# Patient Record
Sex: Female | Born: 1935
Health system: Southern US, Community
[De-identification: ages and names within clinical notes are randomized; demographics above are authoritative.]

## PROBLEM LIST (undated history)

## (undated) DIAGNOSIS — G9340 Encephalopathy, unspecified: Secondary | ICD-10-CM

## (undated) DIAGNOSIS — E7800 Pure hypercholesterolemia, unspecified: Secondary | ICD-10-CM

## (undated) DIAGNOSIS — Z7901 Long term (current) use of anticoagulants: Secondary | ICD-10-CM

## (undated) DIAGNOSIS — K649 Unspecified hemorrhoids: Secondary | ICD-10-CM

## (undated) DIAGNOSIS — N3000 Acute cystitis without hematuria: Secondary | ICD-10-CM

## (undated) DIAGNOSIS — I4891 Unspecified atrial fibrillation: Secondary | ICD-10-CM

## (undated) DIAGNOSIS — I10 Essential (primary) hypertension: Secondary | ICD-10-CM

## (undated) DIAGNOSIS — G3184 Mild cognitive impairment, so stated: Secondary | ICD-10-CM

## (undated) DIAGNOSIS — K5900 Constipation, unspecified: Secondary | ICD-10-CM

## (undated) DIAGNOSIS — M1990 Unspecified osteoarthritis, unspecified site: Secondary | ICD-10-CM

## (undated) DIAGNOSIS — E785 Hyperlipidemia, unspecified: Secondary | ICD-10-CM

## (undated) DIAGNOSIS — K635 Polyp of colon: Secondary | ICD-10-CM

## (undated) DIAGNOSIS — E46 Unspecified protein-calorie malnutrition: Secondary | ICD-10-CM

## (undated) DIAGNOSIS — M270 Developmental disorders of jaws: Secondary | ICD-10-CM

## (undated) DIAGNOSIS — R29898 Other symptoms and signs involving the musculoskeletal system: Secondary | ICD-10-CM

## (undated) DIAGNOSIS — H9190 Unspecified hearing loss, unspecified ear: Secondary | ICD-10-CM

## (undated) DIAGNOSIS — I639 Cerebral infarction, unspecified: Secondary | ICD-10-CM

## (undated) DIAGNOSIS — M810 Age-related osteoporosis without current pathological fracture: Secondary | ICD-10-CM

## (undated) DIAGNOSIS — R531 Weakness: Secondary | ICD-10-CM

## (undated) DIAGNOSIS — G459 Transient cerebral ischemic attack, unspecified: Secondary | ICD-10-CM

## (undated) DIAGNOSIS — R569 Unspecified convulsions: Secondary | ICD-10-CM

## (undated) DIAGNOSIS — I4892 Unspecified atrial flutter: Secondary | ICD-10-CM

## (undated) HISTORY — DX: Polyp of colon: K63.5

## (undated) HISTORY — DX: Unspecified protein-calorie malnutrition: E46

## (undated) HISTORY — DX: Unspecified convulsions: R56.9

## (undated) HISTORY — DX: Mild cognitive impairment of uncertain or unknown etiology: G31.84

## (undated) HISTORY — DX: Constipation, unspecified: K59.00

## (undated) HISTORY — DX: Acute cystitis without hematuria: N30.00

## (undated) HISTORY — PX: MOUTH SURGERY: SHX715

## (undated) HISTORY — PX: GANGLION CYST EXCISION: SHX1691

## (undated) HISTORY — DX: Essential (primary) hypertension: I10

## (undated) HISTORY — DX: Weakness: R53.1

## (undated) HISTORY — PX: CATARACT EXTRACTION W/ INTRAOCULAR LENS  IMPLANT, BILATERAL: SHX1307

## (undated) HISTORY — DX: Transient cerebral ischemic attack, unspecified: G45.9

## (undated) HISTORY — DX: Unspecified hemorrhoids: K64.9

## (undated) HISTORY — DX: Cerebral infarction, unspecified: I63.9

## (undated) HISTORY — DX: Age-related osteoporosis without current pathological fracture: M81.0

## (undated) HISTORY — DX: Hyperlipidemia, unspecified: E78.5

## (undated) HISTORY — PX: TONSILLECTOMY: SUR1361

## (undated) HISTORY — DX: Unspecified osteoarthritis, unspecified site: M19.90

## (undated) HISTORY — DX: Unspecified atrial flutter: I48.92

## (undated) HISTORY — DX: Developmental disorders of jaws: M27.0

## (undated) HISTORY — DX: Unspecified hearing loss, unspecified ear: H91.90

## (undated) HISTORY — DX: Encephalopathy, unspecified: G93.40

---
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
## 1999-05-07 ENCOUNTER — Other Ambulatory Visit: Admission: RE | Admit: 1999-05-07 | Discharge: 1999-05-07 | Payer: Self-pay | Admitting: *Deleted

## 2002-02-15 LAB — CONVERTED CEMR LAB: Pap Smear: NORMAL

## 2008-02-05 ENCOUNTER — Ambulatory Visit: Payer: Self-pay | Admitting: *Deleted

## 2008-02-05 DIAGNOSIS — F1520 Other stimulant dependence, uncomplicated: Secondary | ICD-10-CM | POA: Insufficient documentation

## 2008-02-05 DIAGNOSIS — G43909 Migraine, unspecified, not intractable, without status migrainosus: Secondary | ICD-10-CM

## 2008-02-05 DIAGNOSIS — Z860100 Personal history of colon polyps, unspecified: Secondary | ICD-10-CM | POA: Insufficient documentation

## 2008-02-05 DIAGNOSIS — H9190 Unspecified hearing loss, unspecified ear: Secondary | ICD-10-CM | POA: Insufficient documentation

## 2008-02-05 DIAGNOSIS — D485 Neoplasm of uncertain behavior of skin: Secondary | ICD-10-CM

## 2008-02-05 DIAGNOSIS — M159 Polyosteoarthritis, unspecified: Secondary | ICD-10-CM

## 2008-02-05 DIAGNOSIS — I10 Essential (primary) hypertension: Secondary | ICD-10-CM | POA: Insufficient documentation

## 2008-02-05 DIAGNOSIS — H269 Unspecified cataract: Secondary | ICD-10-CM

## 2008-02-08 ENCOUNTER — Ambulatory Visit: Payer: Self-pay | Admitting: *Deleted

## 2008-02-08 ENCOUNTER — Ambulatory Visit (HOSPITAL_BASED_OUTPATIENT_CLINIC_OR_DEPARTMENT_OTHER): Admission: RE | Admit: 2008-02-08 | Discharge: 2008-02-08 | Payer: Self-pay | Admitting: *Deleted

## 2008-02-08 LAB — CONVERTED CEMR LAB
ALT: 12 units/L (ref 0–35)
AST: 20 units/L (ref 0–37)
Albumin: 3.5 g/dL (ref 3.5–5.2)
Alkaline Phosphatase: 68 units/L (ref 39–117)
BUN: 17 mg/dL (ref 6–23)
Basophils Absolute: 0 10*3/uL (ref 0.0–0.1)
Basophils Relative: 0.9 % (ref 0.0–3.0)
CO2: 32 meq/L (ref 19–32)
Calcium: 9 mg/dL (ref 8.4–10.5)
Chloride: 106 meq/L (ref 96–112)
Cholesterol: 184 mg/dL (ref 0–200)
Creatinine, Ser: 0.9 mg/dL (ref 0.4–1.2)
Eosinophils Absolute: 0.2 10*3/uL (ref 0.0–0.7)
Eosinophils Relative: 4.1 % (ref 0.0–5.0)
GFR calc Af Amer: 77 mL/min
GFR calc non Af Amer: 64 mL/min
Glucose, Bld: 100 mg/dL — ABNORMAL HIGH (ref 70–99)
HCT: 42.2 % (ref 36.0–46.0)
HDL: 52 mg/dL (ref >39.0)
Hemoglobin: 13.8 g/dL (ref 12.0–15.0)
LDL Cholesterol: 113 mg/dL — ABNORMAL HIGH (ref 0–99)
Lymphocytes Relative: 20.1 % (ref 12.0–46.0)
MCHC: 32.8 g/dL (ref 30.0–36.0)
MCV: 93.5 fL (ref 78.0–100.0)
Monocytes Absolute: 0.6 10*3/uL (ref 0.1–1.0)
Monocytes Relative: 12.4 % — ABNORMAL HIGH (ref 3.0–12.0)
Neutro Abs: 3 10*3/uL (ref 1.4–7.7)
Neutrophils Relative %: 62.5 % (ref 43.0–77.0)
Platelets: 161 10*3/uL (ref 150–400)
Potassium: 4.5 meq/L (ref 3.5–5.1)
RBC: 4.51 M/uL (ref 3.87–5.11)
RDW: 12.8 % (ref 11.5–14.6)
Sodium: 141 meq/L (ref 135–145)
TSH: 0.8 microintl units/mL (ref 0.35–5.50)
Total Bilirubin: 0.7 mg/dL (ref 0.3–1.2)
Total CHOL/HDL Ratio: 3.5
Total Protein: 6.6 g/dL (ref 6.0–8.3)
Triglycerides: 97 mg/dL (ref 0–149)
VLDL: 19 mg/dL (ref 0–40)
WBC: 4.7 10*3/uL (ref 4.5–10.5)

## 2008-05-28 ENCOUNTER — Telehealth (INDEPENDENT_AMBULATORY_CARE_PROVIDER_SITE_OTHER): Payer: Self-pay | Admitting: *Deleted

## 2009-05-06 ENCOUNTER — Ambulatory Visit: Payer: Self-pay | Admitting: Internal Medicine

## 2009-05-13 ENCOUNTER — Ambulatory Visit: Payer: Self-pay | Admitting: Family Medicine

## 2009-05-13 ENCOUNTER — Encounter: Payer: Self-pay | Admitting: Internal Medicine

## 2009-06-03 ENCOUNTER — Ambulatory Visit: Payer: Self-pay | Admitting: Internal Medicine

## 2009-06-03 DIAGNOSIS — M8580 Other specified disorders of bone density and structure, unspecified site: Secondary | ICD-10-CM

## 2009-06-09 ENCOUNTER — Telehealth: Payer: Self-pay | Admitting: Internal Medicine

## 2009-07-30 ENCOUNTER — Ambulatory Visit: Payer: Self-pay | Admitting: Internal Medicine

## 2009-07-30 LAB — CONVERTED CEMR LAB
BUN: 22 mg/dL (ref 6–23)
CO2: 26 meq/L (ref 19–32)
Calcium: 9.6 mg/dL (ref 8.4–10.5)
Chloride: 104 meq/L (ref 96–112)
Creatinine, Ser: 0.99 mg/dL (ref 0.40–1.20)
Glucose, Bld: 99 mg/dL (ref 70–99)
Potassium: 4.5 meq/L (ref 3.5–5.3)
Sodium: 142 meq/L (ref 135–145)
TSH: 0.681 microintl units/mL (ref 0.350–4.500)
Vit D, 1,25-Dihydroxy: 38 (ref 30–89)

## 2009-08-04 ENCOUNTER — Ambulatory Visit: Payer: Self-pay | Admitting: Internal Medicine

## 2009-10-07 ENCOUNTER — Ambulatory Visit: Payer: Self-pay | Admitting: Diagnostic Radiology

## 2009-10-07 ENCOUNTER — Ambulatory Visit: Payer: Self-pay | Admitting: Internal Medicine

## 2009-10-07 ENCOUNTER — Ambulatory Visit (HOSPITAL_BASED_OUTPATIENT_CLINIC_OR_DEPARTMENT_OTHER): Admission: RE | Admit: 2009-10-07 | Discharge: 2009-10-07 | Payer: Self-pay | Admitting: Internal Medicine

## 2009-10-07 LAB — CONVERTED CEMR LAB
Calcium: 9.4 mg/dL (ref 8.4–10.5)
Creatinine, Ser: 0.84 mg/dL (ref 0.40–1.20)

## 2009-10-10 ENCOUNTER — Telehealth: Payer: Self-pay | Admitting: Internal Medicine

## 2009-10-16 ENCOUNTER — Encounter (HOSPITAL_COMMUNITY): Admission: RE | Admit: 2009-10-16 | Discharge: 2009-10-16 | Payer: Self-pay | Admitting: Internal Medicine

## 2009-10-19 ENCOUNTER — Encounter: Payer: Self-pay | Admitting: Internal Medicine

## 2009-10-30 ENCOUNTER — Encounter: Payer: Self-pay | Admitting: Internal Medicine

## 2009-12-02 ENCOUNTER — Ambulatory Visit: Payer: Self-pay | Admitting: Internal Medicine

## 2009-12-02 DIAGNOSIS — K120 Recurrent oral aphthae: Secondary | ICD-10-CM

## 2009-12-09 ENCOUNTER — Ambulatory Visit: Payer: Self-pay | Admitting: Internal Medicine

## 2009-12-11 ENCOUNTER — Encounter: Payer: Self-pay | Admitting: Internal Medicine

## 2009-12-25 ENCOUNTER — Ambulatory Visit: Payer: Self-pay | Admitting: Internal Medicine

## 2009-12-31 ENCOUNTER — Encounter: Payer: Self-pay | Admitting: Internal Medicine

## 2009-12-31 LAB — CONVERTED CEMR LAB
BUN: 17 mg/dL (ref 6–23)
Basophils Absolute: 0.1 10*3/uL (ref 0.0–0.1)
Basophils Relative: 2 % — ABNORMAL HIGH (ref 0–1)
CO2: 27 meq/L (ref 19–32)
Calcium: 8.5 mg/dL (ref 8.4–10.5)
Chloride: 107 meq/L (ref 96–112)
Cholesterol: 173 mg/dL (ref 0–200)
Creatinine, Ser: 0.86 mg/dL (ref 0.40–1.20)
Eosinophils Absolute: 0.1 10*3/uL (ref 0.0–0.7)
Eosinophils Relative: 2 % (ref 0–5)
Glucose, Bld: 99 mg/dL (ref 70–99)
HCT: 41.8 % (ref 36.0–46.0)
HDL: 57 mg/dL (ref >39)
Hemoglobin: 13.7 g/dL (ref 12.0–15.0)
LDL Cholesterol: 101 mg/dL — ABNORMAL HIGH (ref 0–99)
Lymphocytes Relative: 24 % (ref 12–46)
Lymphs Abs: 1.1 10*3/uL (ref 0.7–4.0)
MCHC: 32.8 g/dL (ref 30.0–36.0)
MCV: 90.9 fL (ref 78.0–100.0)
Monocytes Absolute: 0.7 10*3/uL (ref 0.1–1.0)
Monocytes Relative: 14 % — ABNORMAL HIGH (ref 3–12)
Neutro Abs: 2.8 10*3/uL (ref 1.7–7.7)
Neutrophils Relative %: 59 % (ref 43–77)
Platelets: 226 10*3/uL (ref 150–400)
Potassium: 4.2 meq/L (ref 3.5–5.3)
RBC: 4.6 M/uL (ref 3.87–5.11)
RDW: 14 % (ref 11.5–15.5)
Sodium: 143 meq/L (ref 135–145)
TSH: 0.797 microintl units/mL (ref 0.350–4.500)
Total CHOL/HDL Ratio: 3
Triglycerides: 75 mg/dL (ref <150)
VLDL: 15 mg/dL (ref 0–40)
WBC: 4.8 10*3/uL (ref 4.0–10.5)

## 2010-01-01 ENCOUNTER — Encounter: Payer: Self-pay | Admitting: Internal Medicine

## 2010-01-09 ENCOUNTER — Ambulatory Visit (HOSPITAL_BASED_OUTPATIENT_CLINIC_OR_DEPARTMENT_OTHER): Admission: RE | Admit: 2010-01-09 | Discharge: 2010-01-09 | Payer: Self-pay | Admitting: Internal Medicine

## 2010-01-09 ENCOUNTER — Ambulatory Visit: Payer: Self-pay | Admitting: Internal Medicine

## 2010-01-09 ENCOUNTER — Ambulatory Visit: Payer: Self-pay | Admitting: Diagnostic Radiology

## 2010-01-09 DIAGNOSIS — R404 Transient alteration of awareness: Secondary | ICD-10-CM

## 2010-01-09 LAB — HM MAMMOGRAPHY: HM Mammogram: NORMAL

## 2010-03-20 ENCOUNTER — Telehealth: Payer: Self-pay | Admitting: Internal Medicine

## 2010-03-29 DIAGNOSIS — I4892 Unspecified atrial flutter: Secondary | ICD-10-CM

## 2010-03-29 HISTORY — DX: Unspecified atrial flutter: I48.92

## 2010-04-14 ENCOUNTER — Inpatient Hospital Stay (HOSPITAL_COMMUNITY)
Admission: AD | Admit: 2010-04-14 | Discharge: 2010-04-15 | Payer: Self-pay | Source: Home / Self Care | Attending: Internal Medicine | Admitting: Internal Medicine

## 2010-04-14 ENCOUNTER — Emergency Department (HOSPITAL_BASED_OUTPATIENT_CLINIC_OR_DEPARTMENT_OTHER)
Admission: EM | Admit: 2010-04-14 | Discharge: 2010-04-14 | Disposition: A | Discharge status: Other Acute Inpatient Hospital | Payer: Self-pay | Source: Home / Self Care | Admitting: Emergency Medicine

## 2010-04-14 ENCOUNTER — Encounter: Payer: Self-pay | Admitting: Internal Medicine

## 2010-04-15 ENCOUNTER — Encounter (INDEPENDENT_AMBULATORY_CARE_PROVIDER_SITE_OTHER): Payer: Self-pay | Admitting: Family Medicine

## 2010-04-15 LAB — URINALYSIS, ROUTINE W REFLEX MICROSCOPIC
Bilirubin Urine: NEGATIVE
Ketones, ur: NEGATIVE mg/dL
Nitrite: NEGATIVE
Protein, ur: NEGATIVE mg/dL
Specific Gravity, Urine: 1.016 (ref 1.005–1.030)
Urine Glucose, Fasting: NEGATIVE mg/dL
Urobilinogen, UA: 0.2 mg/dL (ref 0.0–1.0)
pH: 5.5 (ref 5.0–8.0)

## 2010-04-15 LAB — POCT CARDIAC MARKERS
CKMB, poc: <1 ng/mL — ABNORMAL LOW (ref 1.0–8.0)
Myoglobin, poc: 65.8 ng/mL (ref 12–200)
Troponin i, poc: <0.05 ng/mL (ref 0.00–0.09)

## 2010-04-15 LAB — BASIC METABOLIC PANEL
BUN: 18 mg/dL (ref 6–23)
CO2: 25 mEq/L (ref 19–32)
Calcium: 8.9 mg/dL (ref 8.4–10.5)
Chloride: 110 mEq/L (ref 96–112)
Creatinine, Ser: 0.9 mg/dL (ref 0.4–1.2)
GFR calc Af Amer: >60 mL/min (ref >60)
GFR calc non Af Amer: 60 mL/min — ABNORMAL LOW (ref >60)
Glucose, Bld: 114 mg/dL — ABNORMAL HIGH (ref 70–99)
Potassium: 4.2 mEq/L (ref 3.5–5.1)
Sodium: 145 mEq/L (ref 135–145)

## 2010-04-15 LAB — CARDIAC PANEL(CRET KIN+CKTOT+MB+TROPI)
CK, MB: 1.2 ng/mL (ref 0.3–4.0)
Relative Index: INVALID (ref 0.0–2.5)
Total CK: 28 U/L (ref 7–177)
Troponin I: 0.02 ng/mL (ref 0.00–0.06)

## 2010-04-15 LAB — DIFFERENTIAL
Basophils Absolute: 0.1 10*3/uL (ref 0.0–0.1)
Basophils Relative: 1 % (ref 0–1)
Eosinophils Absolute: 0.2 10*3/uL (ref 0.0–0.7)
Eosinophils Relative: 3 % (ref 0–5)
Lymphocytes Relative: 30 % (ref 12–46)
Lymphs Abs: 1.6 10*3/uL (ref 0.7–4.0)
Monocytes Absolute: 0.6 10*3/uL (ref 0.1–1.0)
Monocytes Relative: 11 % (ref 3–12)
Neutro Abs: 3 10*3/uL (ref 1.7–7.7)
Neutrophils Relative %: 55 % (ref 43–77)

## 2010-04-15 LAB — CBC
HCT: 40.3 % (ref 36.0–46.0)
Hemoglobin: 13.5 g/dL (ref 12.0–15.0)
MCH: 29.3 pg (ref 26.0–34.0)
MCHC: 33.5 g/dL (ref 30.0–36.0)
MCV: 87.6 fL (ref 78.0–100.0)
Platelets: 186 10*3/uL (ref 150–400)
RBC: 4.6 MIL/uL (ref 3.87–5.11)
RDW: 13.8 % (ref 11.5–15.5)
WBC: 5.4 10*3/uL (ref 4.0–10.5)

## 2010-04-15 LAB — URINE MICROSCOPIC-ADD ON

## 2010-04-15 LAB — BRAIN NATRIURETIC PEPTIDE: Pro B Natriuretic peptide (BNP): 173 pg/mL — ABNORMAL HIGH (ref 0.0–100.0)

## 2010-04-15 LAB — PROTIME-INR
INR: 0.94 (ref 0.00–1.49)
Prothrombin Time: 12.8 seconds (ref 11.6–15.2)

## 2010-04-15 LAB — D-DIMER, QUANTITATIVE: D-Dimer, Quant: 0.69 ug/mL-FEU — ABNORMAL HIGH (ref 0.00–0.48)

## 2010-04-20 LAB — COMPREHENSIVE METABOLIC PANEL
ALT: 12 U/L (ref 0–35)
AST: 22 U/L (ref 0–37)
Albumin: 3.1 g/dL — ABNORMAL LOW (ref 3.5–5.2)
Alkaline Phosphatase: 48 U/L (ref 39–117)
BUN: 13 mg/dL (ref 6–23)
CO2: 25 mEq/L (ref 19–32)
Calcium: 8.7 mg/dL (ref 8.4–10.5)
Chloride: 111 mEq/L (ref 96–112)
Creatinine, Ser: 0.9 mg/dL (ref 0.4–1.2)
GFR calc Af Amer: >60 mL/min (ref >60)
GFR calc non Af Amer: 60 mL/min — ABNORMAL LOW (ref >60)
Glucose, Bld: 109 mg/dL — ABNORMAL HIGH (ref 70–99)
Potassium: 4 mEq/L (ref 3.5–5.1)
Sodium: 143 mEq/L (ref 135–145)
Total Bilirubin: 0.6 mg/dL (ref 0.3–1.2)
Total Protein: 5.5 g/dL — ABNORMAL LOW (ref 6.0–8.3)

## 2010-04-20 LAB — PHOSPHORUS: Phosphorus: 3.9 mg/dL (ref 2.3–4.6)

## 2010-04-20 LAB — CBC
HCT: 39.5 % (ref 36.0–46.0)
Hemoglobin: 12.9 g/dL (ref 12.0–15.0)
MCH: 29.9 pg (ref 26.0–34.0)
MCHC: 32.7 g/dL (ref 30.0–36.0)
MCV: 91.4 fL (ref 78.0–100.0)
Platelets: 167 10*3/uL (ref 150–400)
RBC: 4.32 MIL/uL (ref 3.87–5.11)
RDW: 13.9 % (ref 11.5–15.5)
WBC: 5.6 10*3/uL (ref 4.0–10.5)

## 2010-04-20 LAB — CARDIAC PANEL(CRET KIN+CKTOT+MB+TROPI)
CK, MB: 1.1 ng/mL (ref 0.3–4.0)
CK, MB: 1.3 ng/mL (ref 0.3–4.0)
Relative Index: INVALID (ref 0.0–2.5)
Relative Index: INVALID (ref 0.0–2.5)
Total CK: 28 U/L (ref 7–177)
Total CK: 26 U/L (ref 7–177)
Troponin I: 0.02 ng/mL (ref 0.00–0.06)
Troponin I: 0.02 ng/mL (ref 0.00–0.06)

## 2010-04-20 LAB — TSH: TSH: 0.387 u[IU]/mL (ref 0.350–4.500)

## 2010-04-20 LAB — MAGNESIUM: Magnesium: 2.1 mg/dL (ref 1.5–2.5)

## 2010-04-26 ENCOUNTER — Encounter: Payer: Self-pay | Admitting: Internal Medicine

## 2010-04-26 LAB — CONVERTED CEMR LAB

## 2010-04-28 NOTE — Assessment & Plan Note (Signed)
Summary: mouth ulcers/hea   Vital Signs:  Patient profile:   77 year old female Height:      67 inches Weight:      125.50 pounds BMI:     19.73 O2 Sat:      96 % on Room air Temp:     98.0 degrees F oral Pulse rate:   71 / minute Pulse rhythm:   regular Resp:     16 per minute BP sitting:   142 / 72  (right arm) Cuff size:   regular  Vitals Entered By: Glendell Docker CMA (December 09, 2009 2:05 PM)  O2 Flow:  Room air CC: mouth ulcer unresolved Is Patient Diabetic? No Pain Assessment Patient in pain? no      Comments completed  medication today, and still unresolved-some improvement, but not completly healed   Primary Care Provider:  Dondra Spry DO  CC:  mouth ulcer unresolved.  History of Present Illness: 77 y/o white female for fu she finished anti viral medication no sign change in oral ulcer located roof of mouth irritated  by acidic foods  Preventive Screening-Counseling & Management  Alcohol-Tobacco     Smoking Status: quit  Allergies: 1)  Alendronate Sodium (Alendronate Sodium)  Past History:  Past Surgical History: Appendectomy (02/19/1953)  Tonsillectomy (02/15/1934) cataract removal 2006 & 2008    ganglion cyst removal 1938, 1954, 2003, 2005     Physical Exam  General:  alert, well-developed, and well-nourished.   Mouth:  3 mm shallow ulcer roof of mouth  Lungs:  normal respiratory effort and normal breath sounds.   Heart:  normal rate, regular rhythm, and no gallop.     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  refer to ENT  Orders: ENT Referral (ENT)  Complete Medication List: 1)  Excedrin Migraine 250-250-65 Mg Tabs (Aspirin-acetaminophen-caffeine) .... Otc as needed 2)  Zostavax 16109 Unt/0.83ml Solr (Zoster vaccine live) .... Administer vaccine x 1 3)  Hydrochlorothiazide 12.5 Mg Caps (Hydrochlorothiazide) ....  One by mouth once daily 4)  Caltrate 600+d Plus 600-400 Mg-unit Chew (Calcium carbonate-vit d-min) .... One by mouth bid 5)  Lidocaine Viscous 2 % Soln (Lidocaine hcl) .... Use 5-10 ml three times a day as needed (swish and spit)  Other Orders: Influenza Vaccine MCR (60454) Flu Vaccine 71yrs + MEDICARE PATIENTS (U9811)  Patient Instructions: 1)  Our office will contact you re:  referral to ENT 2)  Take vitamin C supplement over the counter once daily Prescriptions: LIDOCAINE VISCOUS 2 % SOLN (LIDOCAINE HCL) use 5-10 ml three times a day as needed (swish and spit)  #120 ml x 1   Entered and Authorized by:   D. Thomos Lemons DO   Signed by:   D. Thomos Lemons DO on 12/09/2009   Method used:   [facility]        [facility]  [address] [phone][facility] (retail)       [address]       [place], [place]  [address]       Ph: [phone] or [phone]       Fax: [phone]   RxID:   [id number]    Immunizations Administered:  Influenza Vaccine # 1:    Vaccine Type: Fluvax MCR    Site: left deltoid    Mfr: GlaxoSmithKline    Dose: 0.5 ml  Route: IM    Given by: Glendell Docker CMA    Exp. Date: 09/26/2010    Lot #: [id number]    VIS given: 10/21/09 version given December 09, 2009.  Flu Vaccine Consent Questions:    Do you have a history of severe allergic reactions to this vaccine? no    Any prior history of allergic reactions to egg and/or gelatin? no    Do you have a sensitivity to the preservative Thimersol? no    Do you have a past history of Guillan-Barre Syndrome? no    Do you currently have an acute febrile illness? no    Have you ever had a severe reaction to latex? no    Vaccine information given and explained to patient? yes    Are you currently pregnant? no

## 2010-04-28 NOTE — Miscellaneous (Signed)
Summary: Lab Orders for October appt.  Clinical Lists Changes  Orders: Added new Test order of T-Basic Metabolic Panel [phone]) - Signed Added new Test order of T-Lipid Profile [phone]) - Signed Added new Test order of T-CBC w/Diff [phone]) - Signed Added new Test order of T-TSH ([id number]) - Signed

## 2010-04-28 NOTE — Progress Notes (Signed)
Summary: Test Results  Phone Note Outgoing Call   Summary of Call: call pt - abd ultrasound negative for aneurysm Initial call taken by: D. Thomos Lemons DO,  October 10, 2009 1:00 PM  Follow-up for Phone Call        attempted to contact patient at [phone], no answer.  a detailed voice message was left informing patient per Dr Artist Pais instructions. Patient was advised to call with any questions Follow-up by: Glendell Docker CMA,  October 10, 2009 4:54 PM

## 2010-04-28 NOTE — Miscellaneous (Signed)
Summary: BONE DENSITY  Clinical Lists Changes  Orders: Added new Test order of T-Bone Densitometry (77080) - Signed Added new Test order of T-Lumbar Vertebral Assessment (77082) - Signed 

## 2010-04-28 NOTE — Assessment & Plan Note (Signed)
Summary: 1 MONTH FOLLOW UP/MHF   Vital Signs:  Patient profile:   77 year old female Weight:      126.75 pounds BMI:     19.92 O2 Sat:      98 % Temp:     97.7 degrees F oral Pulse rate:   69 / minute Pulse rhythm:   regular Resp:     18 per minute BP sitting:   156 / 80  (right arm) Cuff size:   regular  Vitals Entered By: Glendell Docker CMA (June 03, 2009 9:04 AM) CC: Rm 2- 1 Month Follow up Comments follow up blood pressure reading, c/o right eye drainage and pain radiating to jaw   Primary Care Provider:  DThomos Lemons DO  CC:  Rm 2- 1 Month Follow up.  History of Present Illness: 77 y/o white female for f/u re:  elevated BP w/o diagnosis of htn reviewed home bp readings reviewed DEXA scan results  pt accompanied by daughter who works as Engineer, civil (consulting)   Allergies (verified): No Known Drug Allergies  Past History:  Past Medical History: arthritis chicken pox White Coat HTN - home readings WNL  migraines  Blood transfusion Colonic polyps, hx of hearing deficit  Past Surgical History: Appendectomy (02/19/1953) Tonsillectomy (02/15/1934) cataract removal 2006 & 2008  ganglion cyst removal 1938, 1954, 2003, 2005     Family History: Family History of Colon CA  Family History Breast cancer Family History of brain cancer Mother died of blood clot to brain, she had breast cancer Father died of "old age"    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 Alcohol use-yes (glass on wine per week) former smoker     Physical Exam  General:  alert, well-developed, and well-nourished.   Lungs:  normal respiratory effort and normal breath sounds.   Heart:  normal rate, regular rhythm, no murmur, and no gallop.   Msk:  thin,  loss in generalized musche mass   Impression & Recommendations:  Problem # 1:  OSTEOPENIA (ICD-733.90)  DEXA shows right hip score  -2.3.  Start fosamax.  take calcium and vit D  supplement.  encouraged walking program Her updated medication list for this problem includes:    Alendronate Sodium 70 Mg Tabs (Alendronate sodium) ..... One by mouth qwkly  Orders: Prescription Created Electronically [phone])  Problem # 2:  HYPERTENSION (ICD-401.9)  start low dose diuretic.   Home BP readings reviewed.  Most SBP readings 130's with occ 140's and 150's.  Her updated medication list for this problem includes:    Hydrochlorothiazide 12.5 Mg Tabs (Hydrochlorothiazide) .Marland Kitchen... 1/2 by mouth qd  BP today: 156/80 Prior BP: 152/78 (05/06/2009)  Labs Reviewed: K+: 4.5 (02/08/2008) Creat: : 0.9 (02/08/2008)   Chol: 184 (02/08/2008)   HDL: 52.0 (02/08/2008)   LDL: 113 (02/08/2008)   TG: 97 (02/08/2008)  Orders: Prescription Created Electronically [phone])  Complete Medication List: 1)  Excedrin Migraine 250-250-65 Mg Tabs (Aspirin-acetaminophen-caffeine) .... Otc as needed 2)  Zostavax 21308 Unt/0.9ml Solr (Zoster vaccine live) .... Administer vaccine x 1 3)  Hydrochlorothiazide 12.5 Mg Tabs (Hydrochlorothiazide) .... 1/2 by mouth qd 4)  Alendronate Sodium 70 Mg Tabs (Alendronate sodium) .... One by mouth qwkly 5)  Caltrate 600+d Plus 600-400 Mg-unit Chew (Calcium carbonate-vit d-min) .... One by mouth bid  Patient Instructions: 1)  Please schedule a follow-up appointment in 2 months. 2)  BMP prior to visit, ICD-9:  401.9 3)  Vitamin  D level:  733.90 4)  TSH:  401.9 5)  Please return for lab work one (1) week before your next appointment.  6)  Start exercise program as discussed 7)  Walk 15-20 min day 3-4 times per week Prescriptions: CALTRATE 600+D PLUS 600-400 MG-UNIT CHEW (CALCIUM CARBONATE-VIT D-MIN) one by mouth bid  #100 x 11   Entered and Authorized by:   D. Thomos Lemons DO   Signed by:   D. Thomos Lemons DO on 06/03/2009   Method used:   [facility]        [facility]  [address] [phone]* (retail)       [address]       [place],  [place]  [address]       Ph: [phone] or [phone]       Fax: [phone]   RxID:   [id number] ALENDRONATE SODIUM 70 MG TABS (ALENDRONATE SODIUM) one by mouth qwkly  #4 x 2   Entered and Authorized by:   D. Thomos Lemons DO   Signed by:   D. Thomos Lemons DO on 06/03/2009   Method used:   [facility]        [facility]  [address] [phone]* (retail)       [address]       [place], [place]  [address]       Ph: [phone] or [phone]       Fax: [phone]   RxID:   [id number] HYDROCHLOROTHIAZIDE 12.5 MG TABS (HYDROCHLOROTHIAZIDE) 1/2 by mouth qd  #30 x 2   Entered and Authorized by:   D. Thomos Lemons DO   Signed by:   D. Thomos Lemons DO on 06/03/2009   Method used:   [facility]        [facility]  [address] [phone][facility] (retail)       [address]       [place], [place]  [address]       Ph: [phone] or [phone]       Fax: [phone]   RxID:   [id number]   Current Allergies (reviewed today): No known allergies

## 2010-04-28 NOTE — Assessment & Plan Note (Signed)
Summary: 3 MONTH FU/DT   Vital Signs:  Patient profile:   77 year old female Height:      67 inches Weight:      123.75 pounds BMI:     19.45 O2 Sat:      99 % on Room air Temp:     97.9 degrees F oral Pulse rate:   74 / minute Pulse rhythm:   regular Resp:     18 per minute BP sitting:   142 / 90  (left arm) Cuff size:   regular  Vitals Entered By: Glendell Docker CMA (January 09, 2010 10:30 AM)  O2 Flow:  Room air CC: 3 Month follow up Is Patient Diabetic? No Pain Assessment Patient in pain? no      Comments had surgical procedure on the roof of her mouth on 9.24.11, she has been released and completed post op visit, would like order for mammogram, otherwise no concerns   Primary Care Provider:  D. Thomos Lemons DO  CC:  3 Month follow up.  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   Mouth:  no redness of drainage at surgical site (roof of mouth) Lungs:  normal respiratory effort and normal breath sounds.   Heart:  normal rate, regular rhythm, and no gallop.     Impression & Recommendations:  Problem # 1:  DELIRIUM (ICD-780.09) Assessment Improved resolved after stopping tramadol  Problem # 2:  HYPERTENSION (ICD-401.9) pt stopped taking hctz.  pt to monitor bp at home and report signifcant change  The following medications were removed from the medication list:    Hydrochlorothiazide 12.5 Mg Caps (Hydrochlorothiazide) ..... One by mouth once  daily  BP today: 142/90 Prior BP: 110/70 (12/25/2009)  Labs Reviewed: K+: 4.2 (12/31/2009) Creat: : 0.86 (12/31/2009)   Chol: 173 (12/31/2009)   HDL: 57 (12/31/2009)   LDL: 101 (12/31/2009)   TG: 75 (12/31/2009)  Complete Medication List: 1)  Excedrin Migraine 250-250-65 Mg Tabs (Aspirin-acetaminophen-caffeine) .... Otc as needed 2)  Zostavax 04540 Unt/0.19ml Solr (Zoster vaccine live) .... Administer vaccine x 1 3)  Caltrate 600+d Plus 600-400 Mg-unit Chew (Calcium carbonate-vit d-min) .... One by mouth bid  Other Orders: Mammogram (Screening) (Mammo)  Patient Instructions: 1)  Please schedule a follow-up appointment in 6 months.  Current Allergies (reviewed today): ALENDRONATE SODIUM (ALENDRONATE SODIUM) TRAMADOL HCL (TRAMADOL HCL)

## 2010-04-28 NOTE — Assessment & Plan Note (Signed)
Summary: 2 month follow up/mhf   Vital Signs:  Patient profile:   77 year old female Weight:      125.75 pounds BMI:     19.77 O2 Sat:      98 % on Room air Temp:     97.6 degrees F oral Pulse rate:   78 / minute Pulse rhythm:   regular Resp:     20 per minute BP sitting:   144 / 80  (left arm) Cuff size:   regular  Vitals Entered By: Glendell Docker CMA (October 07, 2009 10:17 AM)  O2 Flow:  Room air CC: Rm 2- 2 Month Follow up Is Patient Diabetic? No Comments did not start the Actonel after reading the side effects causing nausea, vaccine for Zostavax was not available   Primary Care Provider:  DThomos Lemons DO  CC:  Rm 2- 2 Month Follow up.  History of Present Illness: 77 y/o white female for f/u pt tried to take generic fosamax.  she stopped due to question rash no lip or tongue swelling    Preventive Screening-Counseling & Management  Alcohol-Tobacco     Smoking Status: quit  Allergies: 1)  Alendronate Sodium (Alendronate Sodium)  Past History:  Past Medical History: arthritis chicken pox hypertension migraines   Blood transfusion Colonic polyps, hx of hearing deficit  Past Surgical History: Appendectomy (02/19/1953) Tonsillectomy (02/15/1934) cataract removal 2006 & 2008    ganglion cyst removal 1938, 1954, 2003, 2005     Family History: Family History of Colon CA  Family History Breast cancer Family History of brain cancer Mother died of blood clot to brain, she had breast cancer Father died of "old age"       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   no dysphagia or abd complaints during the day nausea happens intermittently.  Physical Exam  General:  alert, well-developed, and well-nourished.   Lungs:  normal  respiratory effort and normal breath sounds.   Heart:  normal rate, regular rhythm, and no gallop.   Abdomen:  soft,  mid line abd pulsatile Extremities:  No lower extremity edema  Neurologic:  cranial nerves II-XII intact and gait normal.     Impression & Recommendations:  Problem # 1:  HYPERTENSION (ICD-401.9) stable.  home BPs normal.   Maintain current medication regimen.  Her updated medication list for this problem includes:    Hydrochlorothiazide 12.5 Mg Caps (Hydrochlorothiazide) ..... One by mouth once daily  BP today: 144/80 Prior BP: 132/60 (08/04/2009)  Labs Reviewed: K+: 4.5 (07/30/2009) Creat: : 0.99 (07/30/2009)   Chol: 184 (02/08/2008)   HDL: 52.0 (02/08/2008)   LDL: 113 (02/08/2008)   TG: 97 (02/08/2008)  Problem # 2:  ABDOMINAL AORTIC ANEURYSM (ICD-441.4) pt with mid abd pulsatile area.  rule out AAA Orders: Ultrasound (Ultrasound)  Problem # 3:  OSTEOPENIA (ICD-733.90) she did continue generic fosamax due to rash.  she elects to try once yearly ReClast  The following medications were removed from the medication list:    Actonel 150 Mg Tabs (Risedronate sodium) ..... One by mouth once per month  Orders: T-Calcium  (04540-98119) T-Creatinine Blood (14782-95621) Misc. Referral (Misc. Ref)  Complete Medication List: 1)  Excedrin Migraine 250-250-65  Mg Tabs (Aspirin-acetaminophen-caffeine) .... Otc as needed 2)  Zostavax 16109 Unt/0.21ml Solr (Zoster vaccine live) .... Administer vaccine x 1 3)  Hydrochlorothiazide 12.5 Mg Caps (Hydrochlorothiazide) .... One by mouth once daily 4)  Caltrate 600+d Plus 600-400 Mg-unit Chew (Calcium carbonate-vit d-min) .... One by mouth bid  Patient Instructions: 1)  Please schedule a follow-up appointment in 3 months. 2)  BMP prior to visit, ICD-9:  401.9 3)  Lipid Panel prior to visit, ICD-9:  401.9 4)  TSH prior to visit, ICD-9:  401.9  5)  CBC w/ Diff prior to visit, ICD-9:  401.9 6)  Please return for lab work one  (1) week before your next appointment.  Prescriptions: HYDROCHLOROTHIAZIDE 12.5 MG CAPS (HYDROCHLOROTHIAZIDE) one by mouth once daily  #30 x 5   Entered and Authorized by:   D. Thomos Lemons DO   Signed by:   D. Thomos Lemons DO on 10/07/2009   Method used:   [facility]        [facility]  [address] [phone]* (retail)       [address]       [place], [place]  [address]       Ph: [phone] or [phone]       Fax: [phone]   RxID:   [id number]

## 2010-04-28 NOTE — Assessment & Plan Note (Signed)
Summary: lethargic nauseous since oral surgery/dt   Vital Signs:  Patient profile:   77 year old female Weight:      127 pounds BMI:     19.96 O2 Sat:      92 % on Room air Temp:     98.4 degrees F oral Pulse rate:   66 / minute Pulse rhythm:   regular Resp:     18 per minute BP sitting:   140 / 72  (right arm) BP standing:   110 / 70 Cuff size:   regular  Vitals Entered By: Glendell Docker CMA (December 25, 2009 3:02 PM)  O2 Flow:  Room air CC: Lethargic Is Patient Diabetic? No Pain Assessment Patient in pain? no      Comments Had surgery on the roof of her mouth on Saturday. Today c/o lethargy, pale looking, nausea, weak, unclear thoughts, no problems with meals, patient is present with daughter,no temp, she, she is scheduled for follow up with Dr Wendall Mola on Thursday  October 6 th   Primary Care [place]:  Dondra Spry DO  CC:  Lethargic.  History of Present Illness: 77 y/o white femal had oral surgery last week fatigue / lethargy started today  taking 3 tramadol per day amoxiciilin 500 mg three times a day   not drinking as much  Allergies: 1)  Alendronate Sodium (Alendronate Sodium)  Past History:  Past Medical History: arthritis chicken pox hypertension  migraines     Blood transfusion Colonic polyps, hx of hearing deficit Torus palatinus  Past Surgical History: Appendectomy (02/19/1953)  Tonsillectomy (02/15/1934) cataract removal 2006 & 2008    ganglion cyst removal 1938, 1954, 2003, 2005      Physical Exam  General:  alert, well-developed, and well-nourished.   Lungs:  normal respiratory effort and normal breath sounds.   Heart:  normal rate, regular rhythm, and no gallop.   Abdomen:  soft, non-tender, and normal bowel sounds.   Extremities:  No lower extremity edema   Impression & Recommendations:  Problem # 1:  APHTHOUS ULCERS (ICD-528.2) s/p oral surgery IV sedation at office no signficant blood loss I suspect lethargy side  effect from tramadol DC pain meds  Problem # 2:  HYPERTENSION (ICD-401.9)  Her updated medication list for this problem includes:    Hydrochlorothiazide 12.5 Mg Caps (Hydrochlorothiazide) ..... One by mouth once daily  BP today: 140/72 Prior BP: 142/72 (12/09/2009)  Labs Reviewed: K+: 4.5 (07/30/2009) Creat: : 0.84 (10/07/2009)   Chol: 184 (02/08/2008)   HDL: 52.0 (02/08/2008)   LDL: 113 (02/08/2008)   TG: 97 (02/08/2008)  Complete Medication List: 1)  Excedrin Migraine 250-250-65 Mg Tabs (Aspirin-acetaminophen-caffeine) .... Otc as needed 2)  Zostavax 96045 Unt/0.17ml Solr (Zoster vaccine live) .... Administer vaccine x 1 3)  Hydrochlorothiazide 12.5 Mg Caps (Hydrochlorothiazide) .... One by mouth once daily 4)  Caltrate 600+d Plus 600-400 Mg-unit Chew (Calcium carbonate-vit d-min) .... One by mouth bid 5)  Lidocaine Viscous 2 % Soln (Lidocaine hcl) .... Use 5-10 ml three times a day as needed (swish and spit) 6)  Tramadol Hcl 50 Mg Tabs (Tramadol hcl) .... One tablet by mouth every 6 hours as needed pain  Patient Instructions: 1)  Call our office if your symptoms do not  improve or gets worse.

## 2010-04-28 NOTE — Assessment & Plan Note (Signed)
Summary: new cpx was wilson patient/mhf   Vital Signs:  Patient profile:   77 year old female Height:      67 inches Weight:      126.50 pounds BMI:     19.88 O2 Sat:      96 % on Room air Temp:     97.8 degrees F oral Pulse rate:   87 / minute Pulse rhythm:   regular Resp:     20 per minute BP sitting:   152 / 78  (right arm) Cuff size:   regular  Vitals Entered By: Glendell Docker CMA (May 06, 2009 1:20 PM)  O2 Flow:  Room air  Contraindications/Deferment of Procedures/Staging:    Test/Procedure: PAP Smear    Reason for deferment: not indicated   Primary Care Provider:  Dondra Spry DO  CC:  New Patient transfer.  History of Present Illness: New Patient transfer- yearly follow up  77 y/o white female for routine CPX.   no hx of chronic illness.  hx of elevated BP readings, question white coat htn.   He med hx reviewed no hx of depression no hx of falls,  she stays active     Preventive Screening-Counseling & Management  Alcohol-Tobacco     Smoking Status: quit  Allergies (verified): No Known Drug Allergies  Past History:  Past Medical History: arthritis chicken pox White Coat HTN - home readings WNL  migraines Blood transfusion Colonic polyps, hx of hearing deficit  Past Surgical History: Appendectomy (02/19/1953) Tonsillectomy (02/15/1934) cataract removal 2006 & 2008  ganglion cyst removal 1938, 1954, 2003, 2005    Family History: Family History of Colon CA  Family History Breast cancer Family History of brain cancer Mother died of blood clot to brain, she had breast cancer Father died of "old age"   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     Lungs:  normal respiratory effort and normal breath sounds.   Heart:  normal rate, regular rhythm, no murmur, and no gallop.   Abdomen:  soft, non-tender, no masses, no hepatomegaly, and no splenomegaly.   Extremities:  No lower extremity edema  Neurologic:  cranial nerves II-XII intact and gait normal.     Impression & Recommendations:  Problem # 1:  PREVENTIVE HEALTH CARE (ICD-V70.0) Reviewed adult health maintenance protocols.  Pt advised to monitor her BP at home. pt advised to decrease her sodium intake.  Continue regular exercise  Mammogram: ASSESSMENT: Negative - BI-RADS 1^MM DIGITAL SCREENING (02/08/2008) Pap smear: Declined (05/06/2009) Colonoscopy: Normal (02/18/2005) Td Booster: Tdap (02/05/2008)   Flu Vax: Historical (02/10/2009)   Chol: 184 (02/08/2008)   HDL: 52.0 (02/08/2008)   LDL: 113 (02/08/2008)   TG: 97 (02/08/2008) TSH: 0.80 (02/08/2008)     Complete Medication List: 1)  Excedrin Migraine 250-250-65 Mg Tabs (Aspirin-acetaminophen-caffeine) .... Otc as needed 2)  Zostavax 84696 Unt/0.56ml Solr (Zoster vaccine live) .... Administer vaccine x 1  Other Orders: Pneumococcal Vaccine (29528) Admin 1st Vaccine (41324)  Patient Instructions: 1)  Please schedule a follow-up appointment in 1 month. 2)  Keep log of home blood pressure (at least 10 readings before your next follow up appointment.) 3)  Schedule screening DEXA  Prescriptions: ZOSTAVAX 40981 UNT/0.65ML SOLR (ZOSTER VACCINE LIVE) administer vaccine x 1  #1 x 0   Entered and Authorized by:   D. Thomos Lemons DO   Signed by:   D. Thomos Lemons DO on 05/06/2009   Method used:   Print then Give to Patient   RxID:   [id number]   Current Allergies (reviewed today): No known allergies    Immunization History:  Influenza Immunization History:    Influenza:  historical (02/10/2009)  Immunizations Administered:  Pneumonia Vaccine:    Vaccine Type: Pneumovax    Site: right deltoid    Mfr: Merck     Dose: 0.5 ml    Route: IM    Given by: Jeremy Johann CMA    Exp. Date: 03/13/2010    Lot #: [id number]    VIS given: 10/25/95 version given May 06, 2009.    Preventive Care Screening  Pap Smear:    Date:  05/06/2009    Results:  Declined  Last Flu Shot:    Date:  02/10/2009    Results:  Historical

## 2010-04-28 NOTE — Letter (Signed)
   [place] [facility] [address] [place], [place]  [address]  [place] Phone: [phone]      January 01, 2010   [place] [facility] [address] [place], [place] [address]  RE:  LAB RESULTS  Dear  Ms. Miskell,  The following is an interpretation of your most recent lab tests.  Please take note of any instructions provided or changes to medications that have resulted from your lab work.  ELECTROLYTES:  Good - no changes needed  KIDNEY FUNCTION TESTS:  Good - no changes needed  LIPID PANEL:  Good - no changes needed Triglyceride: 75   Cholesterol: 173   LDL: 101   HDL: 57   Chol/HDL%:  3.0 Ratio  THYROID STUDIES:  Thyroid studies normal TSH: 0.797     CBC:  Good - no changes needed       Sincerely Yours,    Dr. Thomos Lemons  Appended Document:  mailed

## 2010-04-28 NOTE — Progress Notes (Signed)
  Phone Note Call from Patient   Caller: Patient Details for Reason: Rash Summary of Call: Pt  given rx for  generic Fosamax , patient took medication and same day  had  a rash  and eyes  puffy   please advise    Pt #   [phone]  Initial call taken by: Darral Dash,  June 09, 2009 8:51 AM  Follow-up for Phone Call        stop medication.  we can discuss other tx options at next OV Follow-up by: D. Thomos Lemons DO,  June 09, 2009 11:25 AM  Additional Follow-up for Phone Call Additional follow up Details #1::        Call to pt  , stop meds . Additional Follow-up by: Darral Dash,  June 09, 2009 2:13 PM   New Allergies: ALENDRONATE SODIUM (ALENDRONATE SODIUM) New Allergies: ALENDRONATE SODIUM (ALENDRONATE SODIUM)

## 2010-04-28 NOTE — Assessment & Plan Note (Signed)
Summary: 2 MONTH FOLLOW UP/MHF   Vital Signs:  Patient profile:   77 year old female Height:      67 inches Weight:      128.75 pounds BMI:     20.24 O2 Sat:      97 % on Room air Temp:     97.7 degrees F oral Pulse rate:   65 / minute Pulse rhythm:   regular Resp:     24 per minute BP sitting:   132 / 60  (right arm) Cuff size:   regular  Vitals Entered By: Glendell Docker CMA (Aug 04, 2009 10:01 AM)  O2 Flow:  Room air CC: Rm 3- 2 Month Follow up  Comments Follow up on blood pressure  high 169/89  low 117/74   Primary Care Provider:  Dondra Spry DO  CC:  Rm 3- 2 Month Follow up .  History of Present Illness:  Hypertension Follow-Up      This is an 77 year old woman who presents for Hypertension follow-up.  The patient denies lightheadedness and urinary frequency.  The patient denies the following associated symptoms: chest pain.  Compliance with medications (by patient report) has been near 100%.  The patient reports that dietary compliance has been fair.    osteopenia-tried Fosamax but it caused puffy eyes and rash  Allergies: 1)  Alendronate Sodium (Alendronate Sodium)  Past History:  Past Medical History: arthritis chicken pox hypertension migraines  Blood transfusion Colonic polyps, hx of hearing deficit  Past Surgical History: Appendectomy (02/19/1953) Tonsillectomy (02/15/1934) cataract removal 2006 & 2008   ganglion cyst removal 1938, 1954, 2003, 2005     Family History: Family History of Colon CA  Family History Breast cancer Family History of brain cancer Mother died of blood clot to brain, she had breast cancer Father died of "old age"     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 Alcohol use-yes (glass on wine per week) former smoker     Physical Exam  General:  alert, well-developed, and well-nourished.   Neck:  supple, no masses, and no carotid bruits.   Lungs:   normal respiratory effort and normal breath sounds.   Heart:  normal rate, regular rhythm, no murmur, and no gallop.   Extremities:  No lower extremity edema    Impression & Recommendations:  Problem # 1:  HYPERTENSION (ICD-401.9) Assessment Improved  home bp readings still occ 140's to 150's.  she is tolerating hctz well. increase to 12.5 mg daily  Her updated medication list for this problem includes:    Hydrochlorothiazide 12.5 Mg Caps (Hydrochlorothiazide) ..... One by mouth once daily  BP today: 132/60 Prior BP: 156/80 (06/03/2009)  Labs Reviewed: K+: 4.5 (07/30/2009) Creat: : 0.99 (07/30/2009)   Chol: 184 (02/08/2008)   HDL: 52.0 (02/08/2008)   LDL: 113 (02/08/2008)   TG: 97 (02/08/2008)  Problem # 2:  OSTEOPENIA (ICD-733.90) fosamax caused rash and puffy eyes.  trial of actonel. vitamin d level is normal Her updated medication list for this problem includes:    Actonel 150 Mg Tabs (Risedronate sodium) ..... One by mouth once per month  Complete Medication List: 1)  Excedrin Migraine 250-250-65 Mg Tabs (Aspirin-acetaminophen-caffeine) .... Otc as needed 2)  Zostavax 16109 Unt/0.24ml Solr (Zoster vaccine live) .... Administer vaccine x 1 3)  Hydrochlorothiazide 12.5 Mg Caps (Hydrochlorothiazide) .... One by mouth once daily 4)  Caltrate 600+d Plus 600-400 Mg-unit Chew (Calcium  carbonate-vit d-min) .... One by mouth bid 5)  Actonel 150 Mg Tabs (Risedronate sodium) .... One by mouth once per month  Patient Instructions: 1)  Please schedule a follow-up appointment in 2 months. 2)  BMP prior to visit, ICD-9: 401.9 3)  Please return for lab work one (1) week before your next appointment.  Prescriptions: HYDROCHLOROTHIAZIDE 12.5 MG CAPS (HYDROCHLOROTHIAZIDE) one by mouth once daily  #30 x 3   Entered and Authorized by:   D. Thomos Lemons DO   Signed by:   D. Thomos Lemons DO on 08/04/2009   Method used:   [facility]  [address] [phone]* (retail)       [address]       [place], [place]  [address]       Ph: [phone] or [phone]       Fax: [phone]   RxID:   [id number]   Current Allergies (reviewed today): ALENDRONATE SODIUM (ALENDRONATE SODIUM)

## 2010-04-28 NOTE — Assessment & Plan Note (Signed)
Summary: ulceration in roof of mouth/dt   Vital Signs:  Patient profile:   77 year old female Height:      67 inches Weight:      126.75 pounds BMI:     19.92 O2 Sat:      99 % on Room air Temp:     98.2 degrees F oral Pulse rate:   71 / minute Pulse rhythm:   regular Resp:     16 per minute BP sitting:   130 / 80  (left arm) Cuff size:   regular  Vitals Entered By: Mervin Kung CMA Duncan Dull) (December 02, 2009 11:04 AM)  O2 Flow:  Room air CC: Ulceration in top of mouth since end of July, not relieved by ointment prescribed by dentist. Is Patient Diabetic? No Pain Assessment Patient in pain? no      Comments Pt no longer takes oral calcium supplement. Has started Reclast. Mervin Kung CMA Duncan Dull)  December 02, 2009 11:10 AM    Primary Care Provider:  Dondra Spry DO  CC:  Ulceration in top of mouth since end of July and not relieved by ointment prescribed by dentist..  History of Present Illness: 77 y/o white female c/o poorly healing ulcer roof of mouth pain with acidic foods she has Torus palatinus she went to dentist and tried some kind of paste.  it did not help  Preventive Screening-Counseling & Management  Alcohol-Tobacco     Alcohol type: wine     Smoking Status: quit     Year Quit: 1996     Pack years: 40   1/2 pack daily     Passive Smoke Exposure: no  Allergies: 1)  Alendronate Sodium (Alendronate Sodium)  Past History:  Past Medical History: arthritis chicken pox hypertension migraines     Blood transfusion Colonic polyps, hx of hearing deficit Torus palatinus  Past Surgical History: Appendectomy (02/19/1953) Tonsillectomy (02/15/1934) cataract removal 2006 & 2008    ganglion cyst removal 1938, 1954, 2003, 2005       Family History: Family History of Colon CA  Family History Breast cancer Family History of brain cancer Mother died of blood clot to brain, she had breast cancer Father died of "old age"        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 Alcohol use-yes (glass on wine per week) former smoker         Physical Exam  General:  alert, well-developed, and well-nourished.   Mouth:  2-3 mm shallow ulcer roof of mouth torus palatinus Lungs:  normal respiratory effort and normal breath sounds.   Heart:  normal rate, regular rhythm, and no gallop.     Impression & Recommendations:  Problem # 1:  APHTHOUS ULCERS (ICD-528.2) painful non healing ulcer - roof of mouth.  Torus palatinus not helping.   trial of acyclovir if no improvement, we discussed ENT referral  Complete Medication List: 1)  Excedrin Migraine 250-250-65 Mg Tabs (Aspirin-acetaminophen-caffeine) .... Otc as needed 2)  Zostavax 16109 Unt/0.73ml Solr (Zoster vaccine live) .... Administer vaccine x 1 3)  Hydrochlorothiazide 12.5 Mg Caps (Hydrochlorothiazide) .... One by mouth once daily 4)  Caltrate 600+d Plus 600-400 Mg-unit Chew (Calcium carbonate-vit d-min) .... One by mouth bid 5)  Lidocaine Viscous 2 % Soln (Lidocaine hcl) .... Use 5-10 ml three times a day as needed (swish and spit)  Patient Instructions: 1)  Patient advised to call office  if symptoms persist or worsen. Prescriptions: ACYCLOVIR 400 MG TABS (ACYCLOVIR) one by mouth three times a day  #21 x 0   Entered and Authorized by:   D. Thomos Lemons DO   Signed by:   D. Thomos Lemons DO on 12/02/2009   Method used:   [facility]        [facility]  [address] [phone]* (retail)       [address]       [place], [place]  [address]       Ph: [phone] or [phone]       Fax: [phone]   RxID:   [id number]    Current Allergies (reviewed today): ALENDRONATE SODIUM (ALENDRONATE SODIUM)

## 2010-04-28 NOTE — Consult Note (Signed)
Summary: [facility]  [facility]   Imported By: Lanelle Bal 12/23/2009 10:23:12  _____________________________________________________________________  External Attachment:    Type:   Image     Comment:   External Document

## 2010-04-28 NOTE — Miscellaneous (Signed)
  Clinical Lists Changes  Problems: Removed problem of ABDOMINAL AORTIC ANEURYSM (ICD-441.4)

## 2010-04-29 NOTE — Discharge Summary (Signed)
[name], [name]                 ACCOUNT NO.:  [id number]  MEDICAL RECORD NO.:  [id number]          PATIENT TYPE:  INP  LOCATION:  1439                         FACILITY:  [facility]  PHYSICIAN:  Conley Canal, MD      DATE OF BIRTH:  02-17-1925  DATE OF ADMISSION:  04/14/2010 DATE OF DISCHARGE:  04/15/2010                        DISCHARGE SUMMARY - REFERRING   PRIMARY CARE PHYSICIAN:  Barbette Hair. Artist Pais, DO  CONSULTING PHYSICIAN:  Cristy Hilts. Jacinto Halim, MD  DISCHARGE DIAGNOSES: 1. Newly-diagnosed atrial flutter with 3:1 block, rule out sick sinus     syndrome, status post syncopal episode. 2. Osteoporosis. 3. Migraine headaches. 4. Previous history of hypertension.  The patient orthostatic in this     admission.  DISCHARGE MEDICATIONS: 1. Aspirin 81 mg daily. 2. Pradaxa 150 mg every 12 hours. 3. Florinef 0.1 mg q.h.s. 4. Prilosec 20 mg daily. 5. Excedrin migraine 2 tablets daily as needed. 6. Zoledronic acid annual injection. 7. Calcium supplements OTC daily.  PROCEDURES PERFORMED: 1. CT of the brain without contrast showing no acute intracranial     abnormality and some extensive chronic small vessel ischemic     changes in the white matter 2. Chest x-ray January 17 showing generalized hyperinflation. 3. A 2-D echocardiogram on January 18 showing EF 60% to 65% with no     valvular abnormalities.  HOSPITAL COURSE:  This very pleasant 77 year old female otherwise quite healthy up to this point, came in with complaints of feeling bad and passing out in the bathroom.  When she eventually presented to the emergency room. She was found to be in arterial flutter with low blood pressure.  The patient was hence admitted to telemetry and she had serial cardiac enzymes which were negative.  Her D-dimer was 0.69. Otherwise, electrolytes and CBC were within normal limits.  Thyroid function tests also normal.  The patient was seen by Dr. Jacinto Halim who was concerned about possible sick sinus syndrome.   He recommended Pradaxa but did not recommend any beta blockers or calcium-channel blockers as the patient's rate was controlled.  He also recommended Florinef for the low blood pressure and for the patient to follow with him to have a 30- day event monitor as he is concerned about sick sinus syndrome and that the patient may need a permanent pacemaker.  Today, the patient feels okay.  She is asymptomatic.  She will be discharged to follow with Dr. Artist Pais as well as Dr. Jacinto Halim.  I discussed the plan of care with the patient and two supportive daughters.  I added proton pump inhibitor in view of high risk of bleeding at this age for patients on anticoagulation as well as aspirin, zoledronic acid, and Florinef.  She is otherwise discharged in stable condition.  Time spent for discharge preparation less than 30 minutes.     Conley Canal, MD     SR/MEDQ  D:  04/15/2010  T:  04/15/2010  Job:  [id number]  cc:   Barbette Hair. Byng, DO [address] [place], [place] [address]  Cristy Hilts. Jacinto Halim, MD  Electronically Signed by Conley Canal  on 04/29/2010 08:37:01  AM

## 2010-04-30 NOTE — Progress Notes (Signed)
Summary: Handicap Application  Phone Note Outgoing Call   Call placed by: Glendell Docker CMA,  March 20, 2010 4:25 PM Call placed to: Patient Summary of Call: call was placed to patient at [phone] regariding handicap application. She was informed application was left at front desk for patient pick up. She states she will stop by the office on Tuesday to pick up paperwork. Initial call taken by: Glendell Docker CMA,  March 20, 2010 4:25 PM

## 2010-05-10 ENCOUNTER — Emergency Department (INDEPENDENT_AMBULATORY_CARE_PROVIDER_SITE_OTHER): Payer: Medicare Other

## 2010-05-10 ENCOUNTER — Emergency Department (HOSPITAL_BASED_OUTPATIENT_CLINIC_OR_DEPARTMENT_OTHER)
Admission: EM | Admit: 2010-05-10 | Discharge: 2010-05-10 | Disposition: A | Discharge status: Home / Self Care | Payer: Medicare Other | Attending: Emergency Medicine | Admitting: Emergency Medicine

## 2010-05-10 DIAGNOSIS — Y92009 Unspecified place in unspecified non-institutional (private) residence as the place of occurrence of the external cause: Secondary | ICD-10-CM | POA: Insufficient documentation

## 2010-05-10 DIAGNOSIS — M25519 Pain in unspecified shoulder: Secondary | ICD-10-CM | POA: Insufficient documentation

## 2010-05-10 DIAGNOSIS — W108XXA Fall (on) (from) other stairs and steps, initial encounter: Secondary | ICD-10-CM

## 2010-05-10 DIAGNOSIS — S060X1A Concussion with loss of consciousness of 30 minutes or less, initial encounter: Secondary | ICD-10-CM

## 2010-05-10 DIAGNOSIS — S0990XA Unspecified injury of head, initial encounter: Secondary | ICD-10-CM | POA: Insufficient documentation

## 2010-05-10 DIAGNOSIS — M810 Age-related osteoporosis without current pathological fracture: Secondary | ICD-10-CM | POA: Insufficient documentation

## 2010-05-10 DIAGNOSIS — I4891 Unspecified atrial fibrillation: Secondary | ICD-10-CM | POA: Insufficient documentation

## 2010-05-10 DIAGNOSIS — Z79899 Other long term (current) drug therapy: Secondary | ICD-10-CM | POA: Insufficient documentation

## 2010-05-12 ENCOUNTER — Encounter: Payer: Self-pay | Admitting: Internal Medicine

## 2010-05-14 NOTE — Consult Note (Signed)
[name], MEDLEY                 ACCOUNT NO.:  [id number]  MEDICAL RECORD NO.:  [id number]          PATIENT TYPE:  INP  LOCATION:  1439                         FACILITY:  [facility]  PHYSICIAN:  Cristy Hilts. Jacinto Halim, MD       DATE OF BIRTH:  02/16/1925  DATE OF CONSULTATION:  04/14/2010 DATE OF DISCHARGE:                                CONSULTATION   REASON FOR CONSULTATION:  Please evaluate syncope and atrial flutter.  HISTORY:  [name] is a very pleasant active younger than stated age looking 77 year old female with history of osteoporosis, has been doing well otherwise doing well, presented with syncope.  She was at home when she called her daughter while she was in the shower complaining that she does not feel well.  Her daughter witnessed that she looked very pale.  She dropped in the tub bath and the whole episode lasted just a few seconds.  After that, she felt very weak and tired. She was then brought to [facility] for further evaluation.  The patient denied any chest pain or shortness of breath prior to the episode.  The patient had noticed just not feeling well just before she stepped into the bathtub to take a shower, felt that shower would make her feel better, but she felt poorly and had called her daughter.  On further questioning, daughter states that in the last 2 months she has noticed episodes where her mother would certainly feel faint and would sit down.  She has noticed her being very pale.  The patient also states that suddenly there is no rash.  She feels that she will pass out if she stood any further and sat down for a few seconds and felt better. No chest pain, no shortness of breath, no hemoptysis.  No neurological deficits.  REVIEW OF SYSTEMS:  She has no significant bowel or bladder disturbances.  No history of prior syncope and no history of stroke.  No history of seizure disorder.  No bowel or bladder incontinence or disturbances.  No recent  weight changes.  No heat or cold intolerance. She is not a diabetic.  Other systems are negative.  She has had some issues with her tooth and has hadthem removed under general anesthesia just about 3 months ago.  Current home medications included zoledronic acid 5 mg yearly, Excedrin Migraine 2 tablets daily p.r.n., calcium 600 mg p.o. daily.  ALLERGIES:  No known drug allergies.  PAST MEDICAL HISTORY:  No history of hypertension, diabetes, hyperlipidemia.  PAST SURGICAL HISTORY:  None.  SOCIAL HISTORY:  She is widowed.  She lives with her daughter.  She is very active.  She does not drink alcohol.  She is a prior smoker, quit many years ago.  She used to smoke over a pack of cigarettes per day from teenage years until she was in her late 80s.  FAMILY HISTORY:  No history of premature coronary artery disease or diabetes in the family; however, her mother passed away with subarachnoid hemorrhagic at the age of 70.  PHYSICAL EXAMINATION:  GENERAL:  She is moderately built and  well nourished.  She appears to be in no acute distress. VITAL SIGNS:  Temperature of 98.0, pulse is 74 beats per minute and regular, respiration was 18, blood pressure 150/97 mmHg.  She was positive for orthostasis with systolic blood pressure in supine at 142/75 mmHg and standing 128/83 mmHg at presentation at 6 this evening. CARDIAC:  S1 and S2 was normal without any gallop or murmurs. CHEST:  Clear. ABDOMEN:  Soft. EXTREMITIES:  Warm, nontender with full range of motion without any edema.  No extremity tenderness.  Arterial examination was normal.  Her EKG demonstrates underlying atrial flutter with 3:1 conduction and ventricular rate is 75 beats per minute.  There was no significant ST-T wave changes.  Her chest x-ray was within normal limits.  Her D-dimer was minimally positive at 0.69.  cardiac markers were negative x2.  BNP was 173. Urinalysis was normal.  Her sodium was 145, potassium of 4.2.   Her blood glucose was 114.  Creatinine was 0.9 with a BUN of 18.  PT was 12.8 seconds.  IMPRESSION: 1. Syncope.  I suspect sick sinus syndrome.  Bradycardic component of     sick sinus syndrome.  I doubt that orthostasis itself can explain     her symptoms; however, this cannot be completely excluded.  She was     positive for orthostatics with 6 p.m. orthostasis blood pressure     included supine blood pressure of 142/75 mmHg and standing 128/83     mmHg.  No symptoms were reported during this time.  I am going to     repeat her orthostatics. 2. Atrial flutter with 3:1 conduction. 3. Minimally positive D-dimer.  I do not think that she has pulmonary     embolism.  RECOMMENDATIONS: 1. I suspect she will probably need a permanent pacemaker     implantation, but there is no documentation of heart block.  I will     also evaluate her orthostasis.  She has had 3-6 episodes of near-     syncopal spells witnessed by her daughter in the last 2 months.  We     will start the patient on Pradaxa at 150 mg p.o. b.i.d. for atrial     flutter.  We will consider cardioversion in both 3-4 weeks.  I     discussed the risks, benefits, alternatives to using Pradaxa with     the daughter who is also a Engineer, civil (consulting).  Fall precautions were discussed. 2. I do not suspect pulmonary embolism or coronary artery disease or     myocardial infarction.  At this point, if there is no significant     heart block or no significant bradycardia and the patient remains     asymptomatic, she could potentially be discharged with outpatient     follow up.  I will perform outpatient event monitoring for 30 days     to evaluate for sick sinus syndrome.  However, if her orthostatics     continue to be positive at this point we may consider starting her     on support stockings and possibly consider Florinef at 0.1 mg p.o.     at bedtime.  Unless the patient is discharge tomorrow, I will be back to see her for follow up.   Otherwise, I will make arrangements for her to be seen in the outpatient basis at my office.  I will also make arrangements for her event monitoring.  I thank you for asking me to see this  pleasant patient for syncope evaluation.     Cristy Hilts. Jacinto Halim, MD     JRG/MEDQ  D:  04/14/2010  T:  04/15/2010  Job:  [id number]  cc:   Barbette Hair. Artist Pais, DO  Electronically Signed by Yates Decamp MD on 05/14/2010 01:23:02 PM

## 2010-05-14 NOTE — Letter (Signed)
Summary: [facility]  [facility]   Imported By: Lanelle Bal 05/08/2010 10:30:56  _____________________________________________________________________  External Attachment:    Type:   Image     Comment:   External Document

## 2010-05-19 ENCOUNTER — Ambulatory Visit (HOSPITAL_COMMUNITY)
Admission: RE | Admit: 2010-05-19 | Discharge: 2010-05-19 | Disposition: A | Discharge status: Home / Self Care | Payer: Medicare Other | Source: Ambulatory Visit | Attending: Cardiology | Admitting: Cardiology

## 2010-05-19 DIAGNOSIS — I4892 Unspecified atrial flutter: Secondary | ICD-10-CM | POA: Insufficient documentation

## 2010-05-19 DIAGNOSIS — I10 Essential (primary) hypertension: Secondary | ICD-10-CM | POA: Insufficient documentation

## 2010-05-19 DIAGNOSIS — Z01810 Encounter for preprocedural cardiovascular examination: Secondary | ICD-10-CM | POA: Insufficient documentation

## 2010-05-19 DIAGNOSIS — I4891 Unspecified atrial fibrillation: Secondary | ICD-10-CM | POA: Insufficient documentation

## 2010-05-19 HISTORY — PX: CARDIOVERSION: SHX1299

## 2010-05-20 NOTE — Consult Note (Signed)
Summary: [facility]  [facility]   Imported By: Kassie Mends 05/08/2010 10:20:20  _____________________________________________________________________  External Attachment:    Type:   Image     Comment:   External Document

## 2010-05-28 NOTE — Op Note (Signed)
  [name], [name]                 ACCOUNT NO.:  [id number]  MEDICAL RECORD NO.:  [id number]           PATIENT TYPE:  O  LOCATION:  [facility]                         FACILITY:  [facility]  PHYSICIAN:  Vonna Kotyk R. Jacinto Halim, MD       DATE OF BIRTH:  May 07, 1924  DATE OF PROCEDURE:  05/19/2010 DATE OF DISCHARGE:  05/19/2010                              OPERATIVE REPORT   PROCEDURE PERFORMED:  Synchronized direct current cardioversion.  INDICATION:  New onset of atrial fibrillation and atrial flutter.  An 25- year-old female with no prior history who was admitted with syncope. She also has orthostatic hypotension.  She is being placed on Pradaxa for greater than 4 weeks.  Now she is brought for elective cardioversion as this is the first episode of atrial fibrillation.  PROCEDURE:  Using 70 mg of propofol to achieve conscious sedation, synchronized direct current cardioversion was performed.  A 75 joules followed by 100 joules of electricity was delivered to the patient with successful cardioversion from atrial fibrillation to sinus rhythm.  The patient was transferred to the recovery room.  No immediate complication noted.     Cristy Hilts. Jacinto Halim, MD     JRG/MEDQ  D:  05/19/2010  T:  05/20/2010  Job:  [id number]  Electronically Signed by Yates Decamp MD on 05/28/2010 01:03:54 PM

## 2010-07-02 ENCOUNTER — Emergency Department (HOSPITAL_BASED_OUTPATIENT_CLINIC_OR_DEPARTMENT_OTHER)
Admission: EM | Admit: 2010-07-02 | Discharge: 2010-07-02 | Disposition: A | Discharge status: Home / Self Care | Payer: Medicare Other | Attending: Emergency Medicine | Admitting: Emergency Medicine

## 2010-07-02 ENCOUNTER — Emergency Department (INDEPENDENT_AMBULATORY_CARE_PROVIDER_SITE_OTHER): Payer: Medicare Other

## 2010-07-02 ENCOUNTER — Telehealth: Payer: Self-pay | Admitting: Internal Medicine

## 2010-07-02 DIAGNOSIS — K219 Gastro-esophageal reflux disease without esophagitis: Secondary | ICD-10-CM | POA: Insufficient documentation

## 2010-07-02 DIAGNOSIS — R55 Syncope and collapse: Secondary | ICD-10-CM | POA: Insufficient documentation

## 2010-07-02 DIAGNOSIS — I4891 Unspecified atrial fibrillation: Secondary | ICD-10-CM | POA: Insufficient documentation

## 2010-07-02 DIAGNOSIS — M810 Age-related osteoporosis without current pathological fracture: Secondary | ICD-10-CM | POA: Insufficient documentation

## 2010-07-02 DIAGNOSIS — I499 Cardiac arrhythmia, unspecified: Secondary | ICD-10-CM

## 2010-07-02 LAB — BASIC METABOLIC PANEL
Calcium: 8.4 mg/dL (ref 8.4–10.5)
Creatinine, Ser: 0.8 mg/dL (ref 0.4–1.2)
GFR calc Af Amer: >60 mL/min (ref >60)
GFR calc non Af Amer: >60 mL/min (ref >60)
Sodium: 146 mEq/L — ABNORMAL HIGH (ref 135–145)

## 2010-07-02 LAB — DIFFERENTIAL
Basophils Absolute: 0.1 10*3/uL (ref 0.0–0.1)
Basophils Relative: 1 % (ref 0–1)
Eosinophils Absolute: 0.1 10*3/uL (ref 0.0–0.7)
Eosinophils Relative: 3 % (ref 0–5)
Monocytes Absolute: 0.5 10*3/uL (ref 0.1–1.0)

## 2010-07-02 LAB — POCT CARDIAC MARKERS
CKMB, poc: <1 ng/mL — ABNORMAL LOW (ref 1.0–8.0)
Myoglobin, poc: 32.8 ng/mL (ref 12–200)
Myoglobin, poc: 49.9 ng/mL (ref 12–200)

## 2010-07-02 LAB — URINALYSIS, ROUTINE W REFLEX MICROSCOPIC
Bilirubin Urine: NEGATIVE
Glucose, UA: NEGATIVE mg/dL
Hgb urine dipstick: NEGATIVE
Protein, ur: NEGATIVE mg/dL
Specific Gravity, Urine: 1.023 (ref 1.005–1.030)
Urobilinogen, UA: 0.2 mg/dL (ref 0.0–1.0)

## 2010-07-02 LAB — CBC
MCH: 29 pg (ref 26.0–34.0)
MCHC: 33.4 g/dL (ref 30.0–36.0)
RDW: 13.5 % (ref 11.5–15.5)

## 2010-07-02 LAB — URINE MICROSCOPIC-ADD ON

## 2010-07-02 NOTE — Telephone Encounter (Signed)
No,  Keep next appt

## 2010-07-02 NOTE — Telephone Encounter (Signed)
Patient's daughter kim called stating that pt was seen this morning in the ED for a fainting spell. I made appt for pt on 07/17/10 for post ED visit. Pt daughter wanted to make sure that Dr. Artist Pais did not need to see pt before then.

## 2010-07-06 NOTE — Telephone Encounter (Signed)
Call placed to patient at [phone], call disconnected x 3 individual stated they could not hear what was being said. Call placed to patients daughter [name] at [phone], no answer. A detailed voice message was left informing patients daughter per Dr Artist Pais that patient may keep scheduled appointment for April 20,2012. Message was also left for patient to call back if any questions

## 2010-07-17 ENCOUNTER — Telehealth: Payer: Self-pay | Admitting: Internal Medicine

## 2010-07-17 ENCOUNTER — Encounter: Payer: Self-pay | Admitting: Internal Medicine

## 2010-07-17 ENCOUNTER — Ambulatory Visit: Payer: Self-pay | Admitting: Internal Medicine

## 2010-07-17 ENCOUNTER — Ambulatory Visit (INDEPENDENT_AMBULATORY_CARE_PROVIDER_SITE_OTHER): Payer: Medicare Other | Admitting: Internal Medicine

## 2010-07-17 DIAGNOSIS — M949 Disorder of cartilage, unspecified: Secondary | ICD-10-CM

## 2010-07-17 DIAGNOSIS — I4891 Unspecified atrial fibrillation: Secondary | ICD-10-CM

## 2010-07-17 DIAGNOSIS — I951 Orthostatic hypotension: Secondary | ICD-10-CM | POA: Insufficient documentation

## 2010-07-17 DIAGNOSIS — I4892 Unspecified atrial flutter: Secondary | ICD-10-CM | POA: Insufficient documentation

## 2010-07-17 NOTE — Assessment & Plan Note (Signed)
Patient admitted after syncopal episode. Diagnosis atrial flutter with 3-1 block. Patient started on Pradaxa. She was cardioverted by Dr. Nadara Eaton  CBC normal.  She understands to monitor for signs of internal bleeding. Aspirin discontinued. Dr. Nadara Eaton notes pt may need pacemaker.  2-D echocardiogram on 04/15/2010 showed ejection fraction 60-65% with no valvular abnormalities. Anticogualtion status managed by Dr. Nadara Eaton

## 2010-07-17 NOTE — Patient Instructions (Signed)
Please complete the following lab tests before your next follow up appointment: BMET, TSH, CBC  - [phone]

## 2010-07-17 NOTE — Assessment & Plan Note (Signed)
Pt was orthostatic during hospitalization She is on florinef

## 2010-07-17 NOTE — Progress Notes (Signed)
Subjective:    Patient ID: [name], female    DOB: 11/25/1924, 77 y.o.   MRN: [id number]  HPI  77 year old white female for hospital followup. Patient admitted after syncopal episode and diagnosed with atrial flutter in January 2012. Fortunately patient did not suffer any consequences of fall. CT of brain without contrast was negative for acute intracranial abnormalities. Incidental chronic small vessel ischemic changes noted in white matter. Patient was seen by [facility] who later performed cardioversion in February of 2012. She is currently on anticoagulation with pradexa twice daily. Her echocardiogram was noted normal with EF of 60-65% with no valvular abnormalities.  Since discharge patient has been doing fairly well. She denies any signs or symptoms of abnormal bleeding. She has had mild bruising of her skin on her forearms. Her aspirin therapy was discontinued.  Hospital records reviewed.  Review of Systems  No melena.  No chest pain or shortness of breath Working with dentist and oral surgeon re:  Partial plate.  She required lower braces.  Dentist aware pt on anticogulation  Past Medical History  Diagnosis Date  . Arthritis   . Hypertension   . Migraine   . Colon polyp   . Hearing difficulty   . Torus palatinus   . Atrial flutter January, 2012    Dr. Jacinto Halim    History   Social History  . Marital Status: Widowed    Spouse Name: N/A    Number of Children: N/A  . Years of Education: N/A   Occupational History  . 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    Social History Main Topics  . Smoking status: Former Games developer  . Smokeless tobacco: Not on file  . Alcohol Use: 0.6 oz/week    1 Glasses of wine per week  . Drug Use:   . Sexually Active:    Other Topics Concern  . Not on file   Social History Narrative  . No narrative on file    Past Surgical History  Procedure Date  . Appendectomy 02/19/53    `  . Tonsillectomy 02/15/1934  . Cataract extraction 2006,  2008  . Ganglion cyst excision 1938,1954,2003,2005  . Cardioversion 05/19/2010    Dr. Jacinto Halim    Family History  Problem Relation Age of Onset  . Colon cancer    . Breast cancer    . Brain cancer    . Cancer Mother     breast  . Aneurysm Mother     brain    Allergies  Allergen Reactions  . Alendronate Sodium     REACTION: rash and puffy eyes  . Tramadol Hcl     REACTION: lethargy, nausea    Current Outpatient Prescriptions on File Prior to Visit  Medication Sig Dispense Refill  . aspirin-acetaminophen-caffeine (EXCEDRIN MIGRAINE) 250-250-65 MG per tablet Take 1 tablet by mouth as needed.        . Calcium Carbonate-Vitamin D (CALTRATE 600+D) 600-400 MG-UNIT per chew tablet Chew 1 tablet by mouth 2 (two) times daily.          BP 110/80  Pulse 73  Temp(Src) 97.6 F (36.4 C) (Oral)  Resp 16  Wt 121 lb (54.885 kg)  SpO2 98%       Objective:   Physical Exam  Constitutional: She is oriented to person, place, and time. She appears well-developed and well-nourished. No distress.  HENT:  Head: Normocephalic.  Eyes: Pupils are equal, round, and reactive to light.  Neck: Normal range of motion. Neck supple.  Cardiovascular: Normal rate, regular rhythm and normal heart sounds.  Exam reveals no gallop and no friction rub.   No murmur heard. Pulmonary/Chest: Effort normal and breath sounds normal. She has no rales.  Abdominal: Bowel sounds are normal.  Neurological: She is alert and oriented to person, place, and time. No cranial nerve deficit.  Skin: Skin is warm and dry. No rash noted.  Psychiatric: She has a normal mood and affect. Her behavior is normal.          Assessment & Plan:

## 2010-07-17 NOTE — Assessment & Plan Note (Signed)
Pt on yearly ReClast.  Dr. Jacinto Halim started omeprazole for gastric protection

## 2010-07-17 NOTE — Telephone Encounter (Signed)
PLEASE FAX LAB ORDER FOR APPT WITH [facility] ON jULY 9 FOR bmet tSH cbc 427.31

## 2010-07-20 NOTE — Telephone Encounter (Signed)
Orders printed and sent to lab.

## 2010-08-17 ENCOUNTER — Encounter: Payer: Self-pay | Admitting: Family

## 2010-08-17 ENCOUNTER — Ambulatory Visit (INDEPENDENT_AMBULATORY_CARE_PROVIDER_SITE_OTHER): Payer: Medicare Other | Admitting: Family

## 2010-08-17 ENCOUNTER — Ambulatory Visit: Payer: Medicare Other | Admitting: Internal Medicine

## 2010-08-17 VITALS — BP 118/80 | HR 60 | Temp 97.7°F | Resp 16 | Ht 67.0 in | Wt 121.0 lb

## 2010-08-17 DIAGNOSIS — M25519 Pain in unspecified shoulder: Secondary | ICD-10-CM

## 2010-08-17 DIAGNOSIS — M25511 Pain in right shoulder: Secondary | ICD-10-CM

## 2010-08-17 NOTE — Patient Instructions (Signed)
Please complete your MRI on the first floor in the imaging department. Follow up with Dr. Artist Pais in 1 month.

## 2010-08-17 NOTE — Progress Notes (Signed)
Subjective:    Patient ID: [name], female    DOB: April 12, 1924, 77 y.o.   MRN: [id number]  HPI   [name] is an 77 year old female who presents today with chief complaint of right shoulder pain.  She reports that pain started following a fall in February. She was evaluated in the ED at that time and plain film was negative for fracture.  She notes significant pain and finds it difficult to dress herself.  She has not tried any pain meds except uses excedrine migraine for her headaches.      Review of Systems  Past Medical History  Diagnosis Date  . Arthritis   . Hypertension   . Migraine   . Colon polyp   . Hearing difficulty   . Torus palatinus   . Atrial flutter January, 2012    Dr. Jacinto Halim    History   Social History  . Marital Status: Widowed    Spouse Name: N/A    Number of Children: N/A  . Years of Education: N/A   Occupational History  . 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    Social History Main Topics  . Smoking status: Former Games developer  . Smokeless tobacco: Not on file  . Alcohol Use: 0.6 oz/week    1 Glasses of wine per week  . Drug Use:   . Sexually Active:    Other Topics Concern  . Not on file   Social History Narrative  . No narrative on file    Past Surgical History  Procedure Date  . Appendectomy 02/19/53    `  . Tonsillectomy 02/15/1934  . Cataract extraction 2006, 2008  . Ganglion cyst excision 1938,1954,2003,2005  . Cardioversion 05/19/2010    Dr. Jacinto Halim    Family History  Problem Relation Age of Onset  . Colon cancer    . Breast cancer    . Brain cancer    . Cancer Mother     breast  . Aneurysm Mother     brain    Allergies  Allergen Reactions  . Alendronate Sodium     REACTION: rash and puffy eyes  . Tramadol Hcl     REACTION: lethargy, nausea    Current Outpatient Prescriptions on File Prior to Visit  Medication Sig Dispense Refill  . aspirin-acetaminophen-caffeine (EXCEDRIN MIGRAINE) 250-250-65 MG per tablet Take 1  tablet by mouth as needed.        . dabigatran (PRADAXA) 150 MG CAPS Take 150 mg by mouth every 12 (twelve) hours.        . fludrocortisone (FLORINEF) 0.1 MG tablet Take 0.1 mg by mouth every other day.       Marland Kitchen omeprazole (PRILOSEC) 20 MG capsule Take 20 mg by mouth daily.        Marland Kitchen zolendronic acid (ZOMETA) 4 MG/5ML injection Inject 4 mg into the vein once. annually       . Calcium Carbonate-Vitamin D (CALTRATE 600+D) 600-400 MG-UNIT per chew tablet Chew 1 tablet by mouth 2 (two) times daily.          BP 118/80  Pulse 60  Temp(Src) 97.7 F (36.5 C) (Oral)  Resp 16  Ht 5\' 7"  (1.702 m)  Wt 121 lb (54.885 kg)  BMI 18.95 kg/m2        Objective:   Physical Exam  Constitutional: She appears well-developed and well-nourished.  Cardiovascular: Normal rate and regular rhythm.   Pulmonary/Chest: Effort normal and breath sounds normal.  Musculoskeletal:  R shoulder with limited range of motion.  + crepitus.  + tenderness to palpation          Assessment & Plan:

## 2010-08-19 ENCOUNTER — Telehealth: Payer: Self-pay | Admitting: Family

## 2010-08-19 ENCOUNTER — Ambulatory Visit (HOSPITAL_BASED_OUTPATIENT_CLINIC_OR_DEPARTMENT_OTHER)
Admission: RE | Admit: 2010-08-19 | Discharge: 2010-08-19 | Disposition: A | Discharge status: Home / Self Care | Payer: Medicare Other | Source: Ambulatory Visit | Attending: Family | Admitting: Family

## 2010-08-19 DIAGNOSIS — M25511 Pain in right shoulder: Secondary | ICD-10-CM

## 2010-08-19 DIAGNOSIS — M25419 Effusion, unspecified shoulder: Secondary | ICD-10-CM | POA: Insufficient documentation

## 2010-08-19 DIAGNOSIS — M25519 Pain in unspecified shoulder: Secondary | ICD-10-CM | POA: Insufficient documentation

## 2010-08-19 DIAGNOSIS — M19019 Primary osteoarthritis, unspecified shoulder: Secondary | ICD-10-CM | POA: Insufficient documentation

## 2010-08-19 DIAGNOSIS — M25619 Stiffness of unspecified shoulder, not elsewhere classified: Secondary | ICD-10-CM | POA: Insufficient documentation

## 2010-08-19 DIAGNOSIS — M67919 Unspecified disorder of synovium and tendon, unspecified shoulder: Secondary | ICD-10-CM

## 2010-08-19 NOTE — Assessment & Plan Note (Signed)
R shoulder pain.  MRI performed:Notes moderate rotator cuff tendinopathy, with a small distal partial- thickness articular surface rim-rent tear of the anterior  Supraspinatus.  Will refer to sports medicine for further evaluation.

## 2010-08-21 ENCOUNTER — Ambulatory Visit (INDEPENDENT_AMBULATORY_CARE_PROVIDER_SITE_OTHER): Payer: Medicare Other | Admitting: Family Medicine

## 2010-08-21 ENCOUNTER — Encounter: Payer: Self-pay | Admitting: Family Medicine

## 2010-08-21 VITALS — BP 155/70 | HR 76 | Temp 97.6°F | Ht 66.0 in | Wt 121.2 lb

## 2010-08-21 DIAGNOSIS — M25519 Pain in unspecified shoulder: Secondary | ICD-10-CM

## 2010-08-21 NOTE — Patient Instructions (Signed)
You have rotator cuff tendinitis and a partial rotator cuff tear. Most people improve with conservative therapy (physical therapy, icing, avoiding painful activities, cortisone shot). Try to avoid painful activities (overhead activities, lifting with extended arm) as much as possible. Tylenol as needed for pain Subacromial cortisone injection may be beneficial to help with pain and to decrease inflammation. Start physical therapy and do home exercises - this is the most important part of your treatment. Follow up with me in about 4 weeks - if not improving we will do cortisone injection as next step.

## 2010-08-21 NOTE — Progress Notes (Signed)
Subjective:    Patient ID: [name], female    DOB: 06-Sep-1924, 77 y.o.   MRN: [id number]  HPI  77 yo F here for right shoulder pain.  Patient reports that on February 12th while in house she tripped over something, fell down onto right side (directly onto right shoulder she thinks). Had pain immediately following that has persisted past 3 months. Went to ED that day and had x-rays showing no fracture in shoulder. Since then has had pain lateral right shoulder worse with reaching, overhead activities. + night pain and wakes her up. Tried tylenol which has not helped. Is right handed. Had MRI which showed small partial thickness insertional tear of supraspinatus, moderate supraspinatus tendinopathy, AC DJD, and effusion. Here as Referral from Sand[facility] Craze for further treatment.  Past Medical History  Diagnosis Date  . Arthritis   . Hypertension   . Migraine   . Colon polyp   . Hearing difficulty   . Torus palatinus   . Atrial flutter January, 2012    Dr. Jacinto Halim    Current Outpatient Prescriptions on File Prior to Visit  Medication Sig Dispense Refill  . acetaminophen (TYLENOL) 325 MG tablet Take 650 mg by mouth every 6 (six) hours as needed.        Marland Kitchen aspirin-acetaminophen-caffeine (EXCEDRIN MIGRAINE) 250-250-65 MG per tablet Take 1 tablet by mouth as needed.        . Calcium Carbonate-Vitamin D (CALTRATE 600+D) 600-400 MG-UNIT per chew tablet Chew 1 tablet by mouth 2 (two) times daily.        . dabigatran (PRADAXA) 150 MG CAPS Take 150 mg by mouth every 12 (twelve) hours.        . fludrocortisone (FLORINEF) 0.1 MG tablet Take 0.1 mg by mouth every other day.       Marland Kitchen omeprazole (PRILOSEC) 20 MG capsule Take 20 mg by mouth daily.        Marland Kitchen zolendronic acid (ZOMETA) 4 MG/5ML injection Inject 4 mg into the vein once. annually         Past Surgical History  Procedure Date  . Appendectomy 02/19/53    `  . Tonsillectomy 02/15/1934  . Cataract extraction 2006, 2008  .  Ganglion cyst excision 1938,1954,2003,2005  . Cardioversion 05/19/2010    Dr. Jacinto Halim    Allergies  Allergen Reactions  . Alendronate Sodium     REACTION: rash and puffy eyes  . Tramadol Hcl     REACTION: lethargy, nausea    History   Social History  . Marital Status: Widowed    Spouse Name: N/A    Number of Children: N/A  . Years of Education: N/A   Occupational History  . 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    Social History Main Topics  . Smoking status: Former Games developer  . Smokeless tobacco: Not on file  . Alcohol Use: 0.6 oz/week    1 Glasses of wine per week  . Drug Use: Not on file  . Sexually Active: Not on file   Other Topics Concern  . Not on file   Social History Narrative  . No narrative on file    Family History  Problem Relation Age of Onset  . Colon cancer    . Breast cancer    . Brain cancer    . Cancer Mother     breast  . Aneurysm Mother     brain  . Heart attack Neg Hx   . Diabetes Neg Hx   .  Hypertension Neg Hx     BP 155/70  Pulse 76  Temp(Src) 97.6 F (36.4 C) (Oral)  Ht 5\' 6"  (1.676 m)  Wt 121 lb 3.2 oz (54.976 kg)  BMI 19.56 kg/m2  Review of Systems See HPI above.    Objective:   Physical Exam Gen: NAD, thin  R shoulder: No gross deformity, swelling, bruising. No focal TTP at [facility] joint, biceps tendon. Passively lacks 10 degrees external rotation but can fully flex and abduct.  Actively + painful arc - able to abduct to 140 degrees and flex to 120 degrees but with pain around 90 degrees both planes. Strength 5/5 with empty can and resisted ER.  Pain and 4+/5 strength with resisted IR. + hawkins.  Negative neers Negative speeds and yergasons NVI distally.    L shoulder: FROM without pain or weakness.  Assessment & Plan:  1. Right shoulder pain - 2/2 rotator cuff impingement, rotator cuff tendinopathy.  While she has a partial thickness tear of supraspinatus, she has excellent strength and no pain with empty can.  Will start  physical therapy and advised to be compliant with home exercise program.  Offered patient cortisone injection as well (oral nsaids risky with h/o a-flutter and already being on blood thinners) but she said she would like to wait a few weeks to see if physical therapy itself will help.  Will continue tylenol as needed.  Icing as needed.  Discussed avoiding reaching, overhead activities.  See instructions for further.  F/u in 4 weeks.

## 2010-08-21 NOTE — Assessment & Plan Note (Signed)
2/2 rotator cuff impingement, rotator cuff tendinopathy.  While she has a partial thickness tear of supraspinatus, she has excellent strength and no pain with empty can.  Will start physical therapy and advised to be compliant with home exercise program.  Offered patient cortisone injection as well (oral nsaids risky with h/o a-flutter and already being on blood thinners) but she said she would like to wait a few weeks to see if physical therapy itself will help.  Will continue tylenol as needed.  Icing as needed.  Discussed avoiding reaching, overhead activities.  See instructions for further.  F/u in 4 weeks.

## 2010-08-24 ENCOUNTER — Telehealth: Payer: Self-pay | Admitting: Family

## 2010-08-24 NOTE — Telephone Encounter (Signed)
Opened in error

## 2010-08-24 NOTE — Telephone Encounter (Signed)
Discussed with patient findings of MRI and plan to refer to Dr. Pearletha Forge.

## 2010-08-26 ENCOUNTER — Ambulatory Visit: Payer: Medicare Other | Attending: Family Medicine | Admitting: Physical Therapy

## 2010-08-26 DIAGNOSIS — M25519 Pain in unspecified shoulder: Secondary | ICD-10-CM | POA: Insufficient documentation

## 2010-08-26 DIAGNOSIS — IMO0001 Reserved for inherently not codable concepts without codable children: Secondary | ICD-10-CM | POA: Insufficient documentation

## 2010-08-26 DIAGNOSIS — M25619 Stiffness of unspecified shoulder, not elsewhere classified: Secondary | ICD-10-CM | POA: Insufficient documentation

## 2010-08-27 ENCOUNTER — Ambulatory Visit: Payer: Medicare Other | Admitting: Rehabilitation

## 2010-09-03 ENCOUNTER — Ambulatory Visit: Payer: Medicare Other | Attending: Family Medicine | Admitting: Rehabilitation

## 2010-09-03 DIAGNOSIS — M25519 Pain in unspecified shoulder: Secondary | ICD-10-CM | POA: Insufficient documentation

## 2010-09-03 DIAGNOSIS — M25619 Stiffness of unspecified shoulder, not elsewhere classified: Secondary | ICD-10-CM | POA: Insufficient documentation

## 2010-09-03 DIAGNOSIS — IMO0001 Reserved for inherently not codable concepts without codable children: Secondary | ICD-10-CM | POA: Insufficient documentation

## 2010-09-07 ENCOUNTER — Ambulatory Visit: Payer: Medicare Other | Admitting: Physical Therapy

## 2010-09-09 ENCOUNTER — Ambulatory Visit: Payer: Medicare Other | Admitting: Physical Therapy

## 2010-09-11 ENCOUNTER — Ambulatory Visit: Payer: Medicare Other | Admitting: Physical Therapy

## 2010-09-12 ENCOUNTER — Emergency Department (INDEPENDENT_AMBULATORY_CARE_PROVIDER_SITE_OTHER): Payer: Medicare Other

## 2010-09-12 ENCOUNTER — Emergency Department (HOSPITAL_BASED_OUTPATIENT_CLINIC_OR_DEPARTMENT_OTHER)
Admission: EM | Admit: 2010-09-12 | Discharge: 2010-09-13 | Disposition: A | Discharge status: Home / Self Care | Payer: Medicare Other | Attending: Emergency Medicine | Admitting: Emergency Medicine

## 2010-09-12 DIAGNOSIS — R109 Unspecified abdominal pain: Secondary | ICD-10-CM | POA: Insufficient documentation

## 2010-09-12 DIAGNOSIS — R319 Hematuria, unspecified: Secondary | ICD-10-CM

## 2010-09-12 DIAGNOSIS — N201 Calculus of ureter: Secondary | ICD-10-CM | POA: Insufficient documentation

## 2010-09-12 DIAGNOSIS — R1110 Vomiting, unspecified: Secondary | ICD-10-CM

## 2010-09-12 LAB — COMPREHENSIVE METABOLIC PANEL
ALT: 11 U/L (ref 0–35)
CO2: 26 mEq/L (ref 19–32)
Calcium: 9.6 mg/dL (ref 8.4–10.5)
GFR calc Af Amer: 57 mL/min — ABNORMAL LOW (ref >60)
GFR calc non Af Amer: 47 mL/min — ABNORMAL LOW (ref >60)
Glucose, Bld: 177 mg/dL — ABNORMAL HIGH (ref 70–99)
Sodium: 142 mEq/L (ref 135–145)

## 2010-09-12 LAB — CBC
MCH: 28.5 pg (ref 26.0–34.0)
MCHC: 33.1 g/dL (ref 30.0–36.0)
MCV: 86.3 fL (ref 78.0–100.0)
Platelets: 189 10*3/uL (ref 150–400)
RBC: 4.45 MIL/uL (ref 3.87–5.11)
RDW: 14.6 % (ref 11.5–15.5)

## 2010-09-12 LAB — DIFFERENTIAL
Basophils Relative: 0 % (ref 0–1)
Eosinophils Absolute: 0 10*3/uL (ref 0.0–0.7)
Eosinophils Relative: 0 % (ref 0–5)
Lymphs Abs: 0.6 10*3/uL — ABNORMAL LOW (ref 0.7–4.0)
Monocytes Relative: 3 % (ref 3–12)
Neutrophils Relative %: 88 % — ABNORMAL HIGH (ref 43–77)

## 2010-09-13 ENCOUNTER — Emergency Department (HOSPITAL_BASED_OUTPATIENT_CLINIC_OR_DEPARTMENT_OTHER)
Admission: EM | Admit: 2010-09-13 | Discharge: 2010-09-13 | Discharge status: Against Medical Advice | Payer: Medicare Other | Source: Home / Self Care | Attending: Emergency Medicine | Admitting: Emergency Medicine

## 2010-09-13 DIAGNOSIS — N201 Calculus of ureter: Secondary | ICD-10-CM

## 2010-09-13 LAB — URINALYSIS, ROUTINE W REFLEX MICROSCOPIC
Bilirubin Urine: NEGATIVE
Protein, ur: 100 mg/dL — AB
Urobilinogen, UA: 0.2 mg/dL (ref 0.0–1.0)

## 2010-09-14 ENCOUNTER — Telehealth: Payer: Self-pay | Admitting: *Deleted

## 2010-09-14 ENCOUNTER — Ambulatory Visit: Payer: Medicare Other | Admitting: Rehabilitation

## 2010-09-14 LAB — URINE CULTURE
Colony Count: 30000
Culture  Setup Time: 201206171202

## 2010-09-14 NOTE — Telephone Encounter (Signed)
[facility] Triage Call Report Triage Record Num: [id number] Operator: Tarri Glenn Patient Name: [name] Call Date & Time: 09/13/2010 8:56:20AM Patient Phone: [phone] PCP: Thomos Lemons Patient Gender: Female PCP Fax : [phone] Patient DOB: March 24, 1925 Practice Name: [facility] [place] Reason for Call: Daughter/Kim calling about was seen in [place] 09/12/10 and was diagnosed with kidney stone, sent home with antiemetic and pain medication. Pain medication and antiemetic not effective. Afebrile. All emergent s/s r/o with exception to unbearable abdominal/pelvic pain per [facility] protocol. Advised daughter to take patient to ED, will go to [facility]. Protocol(s) Used: [facility] Recommended Outcome per Protocol: See ED Immediately Reason for Outcome: Unbearable abdominal/pelvic pain Care Advice: ~ Another adult should drive. ~ Do not give the patient anything to eat or drink. Write down provider's name. List or place the following in a bag for transport with the patient: current prescription and/or nonprescription medications; alternative treatments, therapies and medications; and street drugs. ~ 09/13/2010 9:06:01AM Page 1 of 1 [id number]

## 2010-09-15 ENCOUNTER — Emergency Department (INDEPENDENT_AMBULATORY_CARE_PROVIDER_SITE_OTHER): Payer: Medicare Other

## 2010-09-15 ENCOUNTER — Encounter: Payer: Medicare Other | Admitting: Rehabilitation

## 2010-09-15 ENCOUNTER — Encounter: Payer: Self-pay | Admitting: Family

## 2010-09-15 ENCOUNTER — Ambulatory Visit (INDEPENDENT_AMBULATORY_CARE_PROVIDER_SITE_OTHER): Payer: Medicare Other | Admitting: Family

## 2010-09-15 VITALS — BP 138/74 | HR 78 | Temp 98.1°F | Resp 16 | Ht 67.0 in | Wt 120.0 lb

## 2010-09-15 DIAGNOSIS — N201 Calculus of ureter: Secondary | ICD-10-CM | POA: Insufficient documentation

## 2010-09-15 MED ORDER — SULFAMETHOXAZOLE-TMP DS 800-160 MG PO TABS: 1.0000 | ORAL_TABLET | Freq: Two times a day (BID) | ORAL | Status: DC

## 2010-09-15 NOTE — Patient Instructions (Signed)
Please complete your ultrasound on the first floor today. Call if you develop fever over 101, blood in urine or recurrent low back pain.

## 2010-09-15 NOTE — Progress Notes (Signed)
Subjective:    Patient ID: [name], female    DOB: [birth year], 77 y.o.   MRN: [id number]  HPI [name] is an 77 year old white female who presents today for ER followup. She was initially seen in the emergency department on June 17 with right-sided flank pain. She underwent a CT of the abdomen which revealed a 3 mm right ureteral stoneR ureteral stone causing partial obstruction. She did have an episode of gross hematuria while she was in the emergency room, but denies any further visible hematuria since discharge. She was sent home with Ultram and a three-day course of Bactrim. Urine culture results from that visit show 30,000 colonies polymicrobial UTI.  Pain is now resolved.  Initially had n/v but this has resolved.  She reports that she is "fine" since Tuesday afternoon.  Tolerating PO's though appetite is not back to normal. She denies current symptoms of fever or dysuria.  Review of Systems See history of present illness  Past Medical History  Diagnosis Date  . Arthritis   . Hypertension   . Migraine   . Colon polyp   . Hearing difficulty   . Torus palatinus   . Atrial flutter January, 2012    Dr. Jacinto Halim    History   Social History  . Marital Status: Widowed    Spouse Name: N/A    Number of Children: N/A  . Years of Education: N/A   Occupational History  . 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    Social History Main Topics  . Smoking status: Former Games developer  . Smokeless tobacco: Not on file  . Alcohol Use: 0.6 oz/week    1 Glasses of wine per week  . Drug Use: Not on file  . Sexually Active: Not on file   Other Topics Concern  . Not on file   Social History Narrative  . No narrative on file    Past Surgical History  Procedure Date  . Appendectomy 02/19/53    `  . Tonsillectomy 02/15/1934  . Cataract extraction 2006, 2008  . Ganglion cyst excision 1938,1954,2003,2005  . Cardioversion 05/19/2010    Dr. Jacinto Halim    Family History  Problem Relation Age of  Onset  . Colon cancer    . Breast cancer    . Brain cancer    . Cancer Mother     breast  . Aneurysm Mother     brain  . Heart attack Neg Hx   . Diabetes Neg Hx   . Hypertension Neg Hx     Allergies  Allergen Reactions  . Alendronate Sodium     REACTION: rash and puffy eyes  . Tramadol Hcl     REACTION: lethargy, nausea    Current Outpatient Prescriptions on File Prior to Visit  Medication Sig Dispense Refill  . acetaminophen (TYLENOL) 325 MG tablet Take 650 mg by mouth every 6 (six) hours as needed.        Marland Kitchen aspirin-acetaminophen-caffeine (EXCEDRIN MIGRAINE) 250-250-65 MG per tablet Take 1 tablet by mouth as needed.        . Calcium Carbonate-Vitamin D (CALTRATE 600+D) 600-400 MG-UNIT per chew tablet Chew 1 tablet by mouth 2 (two) times daily.        . dabigatran (PRADAXA) 150 MG CAPS Take 150 mg by mouth every 12 (twelve) hours.        . fludrocortisone (FLORINEF) 0.1 MG tablet Take 0.1 mg by mouth every other day.       Marland Kitchen omeprazole (PRILOSEC)  20 MG capsule Take 20 mg by mouth daily.        Marland Kitchen zolendronic acid (ZOMETA) 4 MG/5ML injection Inject 4 mg into the vein once. annually         BP 138/74  Pulse 78  Temp(Src) 98.1 F (36.7 C) (Oral)  Resp 16  Ht 5\' 7"  (1.702 m)  Wt 120 lb (54.432 kg)  BMI 18.79 kg/m2       Objective:   Physical Exam  Constitutional: She appears well-developed and well-nourished.  Cardiovascular: Normal rate and regular rhythm.   Pulmonary/Chest: Effort normal and breath sounds normal.  Psychiatric: She has a normal mood and affect. Her behavior is normal. Judgment and thought content normal.          Assessment & Plan:

## 2010-09-15 NOTE — Assessment & Plan Note (Signed)
I suspect that she has passed the stone based on her clinical improvement. She was referred to urology by the ER physician, and tells me that she has an appointment in the middle of July. She was given a three-day course of Bactrim which I will continue for a total of 7 days. In addition I have ordered a followup renal ultrasound in to ensure that the stone has passed. If the stone has not past and we will try to arrange an earlier followup with urology.

## 2010-09-17 ENCOUNTER — Encounter: Payer: Self-pay | Admitting: Internal Medicine

## 2010-09-17 ENCOUNTER — Ambulatory Visit: Payer: Medicare Other | Admitting: Rehabilitation

## 2010-09-17 ENCOUNTER — Ambulatory Visit (INDEPENDENT_AMBULATORY_CARE_PROVIDER_SITE_OTHER): Payer: Medicare Other | Admitting: Internal Medicine

## 2010-09-17 DIAGNOSIS — R112 Nausea with vomiting, unspecified: Secondary | ICD-10-CM

## 2010-09-17 MED ORDER — ONDANSETRON 8 MG PO TBDP: 8.0000 mg | ORAL_TABLET | Freq: Three times a day (TID) | ORAL | Status: AC | PRN

## 2010-09-17 MED ORDER — CIPROFLOXACIN HCL 250 MG PO TABS: 250.0000 mg | ORAL_TABLET | Freq: Two times a day (BID) | ORAL | Status: AC

## 2010-09-18 ENCOUNTER — Ambulatory Visit (INDEPENDENT_AMBULATORY_CARE_PROVIDER_SITE_OTHER): Payer: Medicare Other | Admitting: Family Medicine

## 2010-09-18 ENCOUNTER — Encounter: Payer: Self-pay | Admitting: Family Medicine

## 2010-09-18 VITALS — BP 118/80 | HR 68 | Temp 98.3°F | Ht 67.0 in | Wt 119.0 lb

## 2010-09-18 DIAGNOSIS — M25519 Pain in unspecified shoulder: Secondary | ICD-10-CM

## 2010-09-18 NOTE — Progress Notes (Signed)
Subjective:    Patient ID: [name], female    DOB: November 12, 1924, 77 y.o.   MRN: [id number]  PCP: Peggyann Juba  HPI  77 yo F here for 4 week f/u right shoulder pain.  Last OV: Patient reports that on February 12th while in house she tripped over something, fell down onto right side (directly onto right shoulder she thinks). Had pain immediately following that has persisted past 3 months. Went to ED that day and had x-rays showing no fracture in shoulder. Since then has had pain lateral right shoulder worse with reaching, overhead activities. + night pain and wakes her up. Tried tylenol which has not helped. Is right handed. Had MRI which showed small partial thickness insertional tear of supraspinatus, moderate supraspinatus tendinopathy, AC DJD, and effusion.  Today: Patient has been going to PT over past 4 weeks but missed last 3 sessions because she has been ill with kidney stones. Feeling improved now and shoulder is also better. Pain doesn't wake her at nighttime - hurts with quick movements and overhead movements. Not taking tylenol or icing for this at this time.  Past Medical History  Diagnosis Date  . Arthritis   . Hypertension   . Migraine   . Colon polyp   . Hearing difficulty   . Torus palatinus   . Atrial flutter January, 2012    Dr. Jacinto Halim    Current Outpatient Prescriptions on File Prior to Visit  Medication Sig Dispense Refill  . acetaminophen (TYLENOL) 325 MG tablet Take 650 mg by mouth every 6 (six) hours as needed.        Marland Kitchen aspirin-acetaminophen-caffeine (EXCEDRIN MIGRAINE) 250-250-65 MG per tablet Take 1 tablet by mouth as needed.        . Calcium Carbonate-Vitamin D (CALTRATE 600+D) 600-400 MG-UNIT per chew tablet Chew 1 tablet by mouth 2 (two) times daily.        . ciprofloxacin (CIPRO) 250 MG tablet Take 1 tablet (250 mg total) by mouth 2 (two) times daily.  10 tablet  0  . dabigatran (PRADAXA) 150 MG CAPS Take 150 mg by mouth every 12 (twelve) hours.         . fludrocortisone (FLORINEF) 0.1 MG tablet Take 0.1 mg by mouth every other day.       Marland Kitchen omeprazole (PRILOSEC) 20 MG capsule Take 20 mg by mouth daily.        . ondansetron (ZOFRAN ODT) 8 MG disintegrating tablet Take 1 tablet (8 mg total) by mouth every 8 (eight) hours as needed for nausea.  20 tablet  0  . traMADol (ULTRAM) 50 MG tablet Take 1 tablet by mouth every 6 (six) hours as needed.       . zolendronic acid (ZOMETA) 4 MG/5ML injection Inject 4 mg into the vein once. annually         Past Surgical History  Procedure Date  . Appendectomy 02/19/53    `  . Tonsillectomy 02/15/1934  . Cataract extraction 2006, 2008  . Ganglion cyst excision 1938,1954,2003,2005  . Cardioversion 05/19/2010    Dr. Jacinto Halim    Allergies  Allergen Reactions  . Alendronate Sodium     REACTION: rash and puffy eyes  . Tramadol Hcl     REACTION: lethargy, nausea    History   Social History  . Marital Status: Widowed    Spouse Name: N/A    Number of Children: N/A  . Years of Education: N/A   Occupational History  . editorial work for  [employer]    Social History Main Topics  . Smoking status: Former Games developer  . Smokeless tobacco: Not on file  . Alcohol Use: 0.6 oz/week    1 Glasses of wine per week  . Drug Use: Not on file  . Sexually Active: Not on file   Other Topics Concern  . Not on file   Social History Narrative  . No narrative on file    Family History  Problem Relation Age of Onset  . Colon cancer    . Breast cancer    . Brain cancer    . Cancer Mother     breast  . Aneurysm Mother     brain  . Heart attack Neg Hx   . Diabetes Neg Hx   . Hypertension Neg Hx     BP 118/80  Pulse 68  Temp(Src) 98.3 F (36.8 C) (Oral)  Ht 5\' 7"  (1.702 m)  Wt 119 lb (53.978 kg)  BMI 18.64 kg/m2  Review of Systems  See HPI above.    Objective:   Physical Exam  Gen: NAD, thin  R shoulder: No gross deformity, swelling, bruising. No focal TTP at [facility] joint, biceps  tendon. Full flexion and abduction but external rotation limited to about 30 degrees compared to 70 degrees on left - this is worse than last visit.  Actively + painful arc but can go through full ROM which is better. Strength 4+/5 with empty can (limited by pain), 5/5 with resisted ER/IR. + hawkins.  Equivocal neers Negative speeds and yergasons NVI distally.    L shoulder: FROM without pain or weakness.  Assessment & Plan:  1. Right shoulder pain - 2/2 rotator cuff impingement, rotator cuff tendinopathy, partial thickness supraspinatus tear.  She has improved though she has missed last 3 PT visits.  Discussed options at this point - continued PT with transition to HEP, cortisone injection, or both.  She would like to continue with PT as this has helped and I agree with this approach.  She would be a risky surgical candidate and should have a good outcome with conservative care for this.  F/u in 4-6 weeks if she is not improving and would like to pursue cortisone injection.

## 2010-09-18 NOTE — Assessment & Plan Note (Signed)
2/2 rotator cuff impingement, rotator cuff tendinopathy, partial thickness supraspinatus tear.  She has improved though she has missed last 3 PT visits.  Discussed options at this point - continued PT with transition to HEP, cortisone injection, or both.  She would like to continue with PT as this has helped and I agree with this approach.  She would be a risky surgical candidate and should have a good outcome with conservative care for this.  F/u in 4-6 weeks if she is not improving and would like to pursue cortisone injection.

## 2010-09-19 DIAGNOSIS — R112 Nausea with vomiting, unspecified: Secondary | ICD-10-CM | POA: Insufficient documentation

## 2010-09-19 NOTE — Assessment & Plan Note (Signed)
Exam benign and recent renal US without mention of stone or hydronephrosis. Change septra to cipro. Continue zofran. Treat constipation with miralax prn. If sx's persist consider repeat CT or sooner urology appt. Present to ED with worsening N/V, f/c, abdominal pain.

## 2010-09-19 NOTE — Progress Notes (Signed)
  Subjective:    Patient ID: [name], female    DOB: [birth year], 77 y.o.   MRN: [id number]  HPI Pt presents to clinic for evluation of N/V. Chart reviewed. Recent ED visit with dx of kidney stone. CT demonstrated 3mm right ureteral stone. UA + and was placed on septra. Urine cx demonstrated 30k colonies without specific morphology. Seen in clinic for follow up with resolution of flank pain. Renal US performed without mention of stone or hydronephrosis. Pt denies further pain but today has noted nausea with 2 episodes of emesis. Denies abdominal pain, f/c, hematuria or hematemesis. Does note associated constipation. Tolerating septra and zofran prn. No other alleviating or exacerbating factors. No other complaints.  Reviewed pmh, medications and allergies.    Review of Systems see hpi     Objective:   Physical Exam  Nursing note and vitals reviewed. Constitutional: She appears well-developed and well-nourished. No distress.  HENT:  Head: Normocephalic and atraumatic.  Right Ear: External ear normal.  Left Ear: External ear normal.  Nose: Nose normal.  Eyes: Conjunctivae are normal. No scleral icterus.  Abdominal: Soft. Bowel sounds are normal. She exhibits no distension and no mass. There is no tenderness. There is no rebound and no guarding.  Musculoskeletal:       No cva tenderness  Skin: Skin is warm and dry. No rash noted. She is not diaphoretic. No erythema.  Psychiatric: She has a normal mood and affect.          Assessment & Plan:

## 2010-09-21 ENCOUNTER — Ambulatory Visit: Payer: Medicare Other | Admitting: Physical Therapy

## 2010-09-23 ENCOUNTER — Ambulatory Visit: Payer: Medicare Other | Admitting: Physical Therapy

## 2010-09-24 NOTE — Telephone Encounter (Signed)
Patient seen in office 09/15/2010

## 2010-09-28 ENCOUNTER — Ambulatory Visit: Payer: Medicare Other | Attending: Family Medicine | Admitting: Physical Therapy

## 2010-09-28 DIAGNOSIS — M25619 Stiffness of unspecified shoulder, not elsewhere classified: Secondary | ICD-10-CM | POA: Insufficient documentation

## 2010-09-28 DIAGNOSIS — IMO0001 Reserved for inherently not codable concepts without codable children: Secondary | ICD-10-CM | POA: Insufficient documentation

## 2010-09-28 DIAGNOSIS — M25519 Pain in unspecified shoulder: Secondary | ICD-10-CM | POA: Insufficient documentation

## 2010-10-05 ENCOUNTER — Ambulatory Visit: Payer: Medicare Other | Admitting: Physical Therapy

## 2010-10-05 ENCOUNTER — Telehealth: Payer: Self-pay | Admitting: *Deleted

## 2010-10-05 DIAGNOSIS — I4891 Unspecified atrial fibrillation: Secondary | ICD-10-CM

## 2010-10-05 LAB — TSH: TSH: 1.038 u[IU]/mL (ref 0.350–4.500)

## 2010-10-05 NOTE — Telephone Encounter (Signed)
Pt presented to the lab for blood draw today.  I see open orders from 07/20/10 for CBC, BMP and TSH. Pt was seen in the ER and had CBC and CMP completed in June. Please advise what labs pt needs to have?

## 2010-10-05 NOTE — Telephone Encounter (Signed)
Order entered and forwarded to the lab. 

## 2010-10-05 NOTE — Telephone Encounter (Signed)
OK to do TSH only.

## 2010-10-07 ENCOUNTER — Ambulatory Visit: Payer: Medicare Other | Admitting: Physical Therapy

## 2010-10-12 ENCOUNTER — Encounter: Payer: Self-pay | Admitting: Family

## 2010-10-12 ENCOUNTER — Ambulatory Visit: Payer: Medicare Other | Admitting: Internal Medicine

## 2010-10-12 ENCOUNTER — Ambulatory Visit (INDEPENDENT_AMBULATORY_CARE_PROVIDER_SITE_OTHER): Payer: Medicare Other | Admitting: Family

## 2010-10-12 DIAGNOSIS — I951 Orthostatic hypotension: Secondary | ICD-10-CM

## 2010-10-12 DIAGNOSIS — I4892 Unspecified atrial flutter: Secondary | ICD-10-CM

## 2010-10-12 NOTE — Assessment & Plan Note (Signed)
Clinically stable. This is being managed by Cardiology (Dr. Jacinto Halim).  She is now down to a QOD dosing schedule which she is tolerating.

## 2010-10-12 NOTE — Progress Notes (Signed)
Subjective:    Patient ID: [name], female    DOB: 10/31/1924, 77 y.o.   MRN: [id number]  HPI  [name] is an 77 yr old female who presents today for follow up.   Atrial flutter-  She denies palpitations or shortness of breath.  Has some bruising on her right arm.  Sees Dr. Jacinto Halim, has f/u in December.   BP-  She continues on florinef. Now taking every other day.  She denies any dizziness upon standing,  or near syncope.   Nausea/vomitting- resolved.    Review of Systems See HPI  Past Medical History  Diagnosis Date  . Arthritis   . Hypertension   . Migraine   . Colon polyp   . Hearing difficulty   . Torus palatinus   . Atrial flutter January, 2012    Dr. Jacinto Halim    History   Social History  . Marital Status: Widowed    Spouse Name: N/A    Number of Children: N/A  . Years of Education: N/A   Occupational History  . 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    Social History Main Topics  . Smoking status: Former Games developer  . Smokeless tobacco: Not on file  . Alcohol Use: 0.6 oz/week    1 Glasses of wine per week  . Drug Use: Not on file  . Sexually Active: Not on file   Other Topics Concern  . Not on file   Social History Narrative  . No narrative on file    Past Surgical History  Procedure Date  . Appendectomy 02/19/53    `  . Tonsillectomy 02/15/1934  . Cataract extraction 2006, 2008  . Ganglion cyst excision 1938,1954,2003,2005  . Cardioversion 05/19/2010    Dr. Jacinto Halim    Family History  Problem Relation Age of Onset  . Colon cancer    . Breast cancer    . Brain cancer    . Cancer Mother     breast  . Aneurysm Mother     brain  . Heart attack Neg Hx   . Diabetes Neg Hx   . Hypertension Neg Hx     Allergies  Allergen Reactions  . Alendronate Sodium     REACTION: rash and puffy eyes  . Tramadol Hcl     REACTION: lethargy, nausea    Current Outpatient Prescriptions on File Prior to Visit  Medication Sig Dispense Refill  . acetaminophen  (TYLENOL) 325 MG tablet Take 650 mg by mouth every 6 (six) hours as needed.        Marland Kitchen aspirin-acetaminophen-caffeine (EXCEDRIN MIGRAINE) 250-250-65 MG per tablet Take 1 tablet by mouth as needed.        . dabigatran (PRADAXA) 150 MG CAPS Take 150 mg by mouth every 12 (twelve) hours.        . fludrocortisone (FLORINEF) 0.1 MG tablet Take 0.1 mg by mouth every other day.       Marland Kitchen omeprazole (PRILOSEC) 20 MG capsule Take 20 mg by mouth daily.        Marland Kitchen zolendronic acid (ZOMETA) 4 MG/5ML injection Inject 4 mg into the vein once. annually         BP 146/88  Pulse 72  Temp(Src) 98.1 F (36.7 C) (Oral)  Resp 16  Ht 5\' 7"  (1.702 m)  Wt 120 lb 0.6 oz (54.45 kg)  BMI 18.80 kg/m2       Objective:   Physical Exam  Constitutional: She is oriented to person, place, and  time. She appears well-developed and well-nourished.  Cardiovascular: Normal rate and regular rhythm.   Pulmonary/Chest: Effort normal and breath sounds normal.  Musculoskeletal: She exhibits no edema.  Neurological: She is alert and oriented to person, place, and time.  Skin:          ecchymosis noted on right forearm  Psychiatric: She has a normal mood and affect. Her behavior is normal. Thought content normal.          Assessment & Plan:

## 2010-10-12 NOTE — Patient Instructions (Signed)
Please follow up in 3 months. Sooner if problems or concerns.

## 2010-10-12 NOTE — Assessment & Plan Note (Addendum)
This is being managed by cardiology.  Patient is rate controlled.  Reassurance provided to pt re: mild bruising is normal while on Pradaxa.

## 2011-01-18 ENCOUNTER — Encounter: Payer: Self-pay | Admitting: Internal Medicine

## 2011-01-18 ENCOUNTER — Ambulatory Visit (INDEPENDENT_AMBULATORY_CARE_PROVIDER_SITE_OTHER): Payer: Medicare Other | Admitting: Internal Medicine

## 2011-01-18 VITALS — BP 112/80 | HR 92 | Temp 97.8°F | Resp 20 | Wt 120.0 lb

## 2011-01-18 DIAGNOSIS — J069 Acute upper respiratory infection, unspecified: Secondary | ICD-10-CM

## 2011-01-18 DIAGNOSIS — Z23 Encounter for immunization: Secondary | ICD-10-CM

## 2011-01-18 MED ORDER — DOXYCYCLINE HYCLATE 100 MG PO TABS: 100.0000 mg | ORAL_TABLET | Freq: Two times a day (BID) | ORAL | Status: AC

## 2011-01-18 NOTE — Progress Notes (Signed)
  Subjective:    Patient ID: [name], female    DOB: 1924/03/30, 77 y.o.   MRN: [id number]  HPI Pt presents to clinic for evaluation of sore throat. Notes 3d h/o ST , nasal drainage and congestion with productive cough. No hemoptysis, fever, chills, dyspnea or wheezing. Taking mucinex with mild improvement. No known sick exposure. No difficulty controlling secretions. No other alleviating or exacerbating factors. No other complaints.  Past Medical History  Diagnosis Date  . Arthritis   . Hypertension   . Migraine   . Colon polyp   . Hearing difficulty   . Torus palatinus   . Atrial flutter January, 2012    Dr. Jacinto Halim   Past Surgical History  Procedure Date  . Appendectomy 02/19/53    `  . Tonsillectomy 02/15/1934  . Cataract extraction 2006, 2008  . Ganglion cyst excision 1938,1954,2003,2005  . Cardioversion 05/19/2010    Dr. Jacinto Halim    reports that she has quit smoking. She has never used smokeless tobacco. She reports that she drinks about .6 ounces of alcohol per week. Her drug history not on file. family history includes Aneurysm in her mother; Brain cancer in an unspecified family member; Breast cancer in an unspecified family member; Cancer in her mother; and Colon cancer in an unspecified family member.  There is no history of Heart attack, and Diabetes, and Hypertension, . Allergies  Allergen Reactions  . Alendronate Sodium     REACTION: rash and puffy eyes  . Tramadol Hcl     REACTION: lethargy, nausea       Review of Systems see hpi     Objective:   Physical Exam  Nursing note and vitals reviewed. Constitutional: She appears well-developed and well-nourished. No distress.  HENT:  Head: Normocephalic and atraumatic.  Right Ear: External ear and ear canal normal.  Left Ear: Tympanic membrane and ear canal normal.  Nose: Nose normal.  Mouth/Throat: Oropharynx is clear and moist. No oropharyngeal exudate.  Eyes: Conjunctivae are normal. No scleral icterus.    Neck: Neck supple.  Cardiovascular: Normal rate, regular rhythm and normal heart sounds.   Pulmonary/Chest: Effort normal and breath sounds normal. No respiratory distress. She has no wheezes. She has no rales.  Neurological: She is alert.  Skin: Skin is warm and dry. She is not diaphoretic.  Psychiatric: She has a normal mood and affect.          Assessment & Plan:

## 2011-01-18 NOTE — Assessment & Plan Note (Signed)
Could not tolerate rapid strep swab. Suspect likely currently viral etiology. Continue sx tx prn. Printed abx given. Begin abx if no improvement of sx's after total duration of ~8 days. Followup if no improvement or worsening.

## 2011-06-01 DIAGNOSIS — Z961 Presence of intraocular lens: Secondary | ICD-10-CM | POA: Diagnosis not present

## 2011-06-09 DIAGNOSIS — Z7901 Long term (current) use of anticoagulants: Secondary | ICD-10-CM | POA: Diagnosis not present

## 2011-06-09 DIAGNOSIS — I4892 Unspecified atrial flutter: Secondary | ICD-10-CM | POA: Diagnosis not present

## 2011-06-09 DIAGNOSIS — I951 Orthostatic hypotension: Secondary | ICD-10-CM | POA: Diagnosis not present

## 2011-12-27 DIAGNOSIS — I951 Orthostatic hypotension: Secondary | ICD-10-CM | POA: Diagnosis not present

## 2011-12-27 DIAGNOSIS — Z7901 Long term (current) use of anticoagulants: Secondary | ICD-10-CM | POA: Diagnosis not present

## 2011-12-27 DIAGNOSIS — I4892 Unspecified atrial flutter: Secondary | ICD-10-CM | POA: Diagnosis not present

## 2012-01-14 ENCOUNTER — Ambulatory Visit (INDEPENDENT_AMBULATORY_CARE_PROVIDER_SITE_OTHER): Payer: Medicare Other | Admitting: Internal Medicine

## 2012-01-14 ENCOUNTER — Encounter: Payer: Self-pay | Admitting: Internal Medicine

## 2012-01-14 VITALS — BP 146/80 | HR 85 | Ht 67.0 in | Wt 124.4 lb

## 2012-01-14 DIAGNOSIS — I4892 Unspecified atrial flutter: Secondary | ICD-10-CM

## 2012-01-16 ENCOUNTER — Encounter: Payer: Self-pay | Admitting: Internal Medicine

## 2012-01-16 NOTE — Assessment & Plan Note (Signed)
The patient has typical appearing atrial flutter.  Her CHADS2VASC score is 4.  She require anticoagulation and is presently anticoagulation with xarelto.  She has been placed on flecainide which is ineffective.  I will therefore stop flecainide today.  Our options are to continue lifelong anticoagulation or proceed with ablation in order to discontinue anticoagulation.  She is aware that atrial flutter can likely be successfully ablated, however we are uncertain as to whether she will develop atrial fibrillation in the future which would again require anticoagulation. Therapeutic strategies for atrial flutter including medicine and ablation were discussed in detail with the patient today. Risk, benefits, and alternatives to EP study and radiofrequency ablation were also discussed in detail today. These risks include but are not limited to stroke, bleeding, vascular damage, tamponade, perforation, damage to the heart and other structures, AV block requiring pacemaker, worsening renal function, and death.   As she is asymptomatic with atrial flutter, she is leaning towards longterm anticoagulation.  She wishes to discuss this further with her daughters.  She will contact my office if she decides to pursue ablation going forward.  She will continue to follow-up with Dr Jacinto Halim as scheduled in the interim.

## 2012-01-16 NOTE — Progress Notes (Signed)
Primary Care Physician: Letitia Libra, Ala Dach, MD Referring Physician:  Dr Geralyn Flash [name] is a 78 y.o. female with a h/o hypertension and atrial flutter who presents for EP consultation.  She presented 2012 with atrial flutter for which she was cardioverted.  She did well initially but has developed recurrent atrial flutter.  She has been placed on xarelto and flecainide by Dr Jacinto Halim.  She remains in atrial flutter despite medical therapy with flecainide.  She carries a history of atrial fibrillation though this is not well documented and appears to be primarily atrial flutter.  She is presently asymptomatic with atrial flutter.  She remains active despite her age.  Today, she denies symptoms of palpitations, chest pain, shortness of breath, orthopnea, PND, lower extremity edema, dizziness, presyncope, syncope, or neurologic sequela. The patient is tolerating medications without difficulties and is otherwise without complaint today.   Past Medical History  Diagnosis Date  . Arthritis   . Hypertension   . Migraine   . Colon polyp   . Hearing difficulty   . Torus palatinus   . Atrial flutter January, 2012   Past Surgical History  Procedure Date  . Appendectomy 02/19/53    `  . Tonsillectomy 02/15/1934  . Cataract extraction 2006, 2008  . Ganglion cyst excision 1938,1954,2003,2005  . Cardioversion 05/19/2010    Dr. Jacinto Halim    Current Outpatient Prescriptions  Medication Sig Dispense Refill  . acetaminophen (TYLENOL) 325 MG tablet Take 650 mg by mouth every 6 (six) hours as needed.        Marland Kitchen aspirin-acetaminophen-caffeine (EXCEDRIN MIGRAINE) 250-250-65 MG per tablet Take 1 tablet by mouth as needed.        . flecainide (TAMBOCOR) 50 MG tablet Take 50 mg by mouth 2 (two) times daily.      . Multiple Vitamin (MULTIVITAMIN) capsule Take 1 capsule by mouth daily.      Marland Kitchen omeprazole (PRILOSEC) 20 MG capsule Take 20 mg by mouth daily.        . Rivaroxaban (XARELTO) 20 MG TABS Take  20 mg by mouth daily.        Allergies  Allergen Reactions  . Alendronate Sodium     REACTION: rash and puffy eyes  . Tramadol Hcl     REACTION: lethargy, nausea    History   Social History  . Marital Status: Widowed    Spouse Name: N/A    Number of Children: N/A  . Years of Education: N/A   Occupational History  . 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    Social History Main Topics  . Smoking status: Former Games developer  . Smokeless tobacco: Never Used  . Alcohol Use: 0.6 oz/week    1 Glasses of wine per week  . Drug Use: No  . Sexually Active: Not on file   Other Topics Concern  . Not on file   Social History Narrative  . No narrative on file    Family History  Problem Relation Age of Onset  . Colon cancer    . Breast cancer    . Brain cancer    . Cancer Mother     breast  . Aneurysm Mother     brain  . Heart attack Neg Hx   . Diabetes Neg Hx   . Hypertension Neg Hx     ROS- All systems are reviewed and negative except as per the HPI above  Physical Exam: Filed Vitals:   01/14/12 1447  BP:  146/80  Pulse: 85  Height: 5\' 7"  (1.702 m)  Weight: 124 lb 6.4 oz (56.427 kg)    GEN- The patient is elderly appearing, alert and oriented x 3 today.   Head- normocephalic, atraumatic Eyes-  Sclera clear, conjunctiva pink Ears- hearing intact Oropharynx- clear Neck- supple, no JVP Lymph- no cervical lymphadenopathy Lungs- Clear to ausculation bilaterally, normal work of breathing Heart- irregular rate and rhythm, no murmurs, rubs or gallops, PMI not laterally displaced GI- soft, NT, ND, + BS Extremities- no clubbing, cyanosis, or edema MS- no significant deformity or atrophy Skin- no rash or lesion Psych- euthymic mood, full affect Neuro- strength and sensation are intact  EKG today reveals typical appearing atrial flutter, V rate 85 bpm  Assessment and Plan:

## 2012-01-25 ENCOUNTER — Encounter: Payer: Self-pay | Admitting: Internal Medicine

## 2012-05-03 DIAGNOSIS — H9190 Unspecified hearing loss, unspecified ear: Secondary | ICD-10-CM | POA: Diagnosis not present

## 2012-05-03 DIAGNOSIS — H6120 Impacted cerumen, unspecified ear: Secondary | ICD-10-CM | POA: Diagnosis not present

## 2012-05-25 DIAGNOSIS — I951 Orthostatic hypotension: Secondary | ICD-10-CM | POA: Diagnosis not present

## 2012-05-25 DIAGNOSIS — I4892 Unspecified atrial flutter: Secondary | ICD-10-CM | POA: Diagnosis not present

## 2012-06-01 ENCOUNTER — Observation Stay (HOSPITAL_COMMUNITY)
Admission: AD | Admit: 2012-06-01 | Discharge: 2012-06-02 | Disposition: A | Discharge status: Home / Self Care | Payer: Medicare Other | Source: Other Acute Inpatient Hospital | Attending: Cardiology | Admitting: Cardiology

## 2012-06-01 DIAGNOSIS — I499 Cardiac arrhythmia, unspecified: Secondary | ICD-10-CM | POA: Diagnosis not present

## 2012-06-01 DIAGNOSIS — R944 Abnormal results of kidney function studies: Secondary | ICD-10-CM | POA: Diagnosis not present

## 2012-06-01 DIAGNOSIS — R55 Syncope and collapse: Secondary | ICD-10-CM | POA: Diagnosis not present

## 2012-06-01 DIAGNOSIS — R7309 Other abnormal glucose: Secondary | ICD-10-CM | POA: Diagnosis not present

## 2012-06-01 DIAGNOSIS — I951 Orthostatic hypotension: Secondary | ICD-10-CM | POA: Diagnosis not present

## 2012-06-01 DIAGNOSIS — R824 Acetonuria: Secondary | ICD-10-CM | POA: Diagnosis not present

## 2012-06-01 DIAGNOSIS — I4892 Unspecified atrial flutter: Secondary | ICD-10-CM | POA: Insufficient documentation

## 2012-06-01 DIAGNOSIS — Z7901 Long term (current) use of anticoagulants: Secondary | ICD-10-CM | POA: Diagnosis not present

## 2012-06-01 DIAGNOSIS — R4182 Altered mental status, unspecified: Secondary | ICD-10-CM | POA: Diagnosis not present

## 2012-06-01 DIAGNOSIS — G319 Degenerative disease of nervous system, unspecified: Secondary | ICD-10-CM | POA: Diagnosis not present

## 2012-06-01 DIAGNOSIS — R0789 Other chest pain: Secondary | ICD-10-CM | POA: Diagnosis not present

## 2012-06-01 MED ORDER — FLECAINIDE ACETATE 50 MG PO TABS
50.0000 mg | ORAL_TABLET | Freq: Two times a day (BID) | ORAL | Status: DC
  Filled 2012-06-01 (×3): qty 1

## 2012-06-01 MED ORDER — RIVAROXABAN 20 MG PO TABS
20.0000 mg | ORAL_TABLET | Freq: Every day | ORAL | Status: DC
  Administered 2012-06-01: 20 mg via ORAL
  Filled 2012-06-01 (×2): qty 1

## 2012-06-01 NOTE — H&P (Addendum)
[name] is an 80 y.o. female.   Chief Complaint: Syncope HPI: Patient is 53 Caucasian female with history of chronic atrial flutter, with history of prior syncope and severe orthostatic hypotension who had been doing well and had seen her just a few days ago in my office. She has been having recurrent episodes or dizziness and most of her episodes have been occurring after she taken a hot bath and comes down for breakfast. Similarly today this morning, she felt dizzy just prior to taking a shower and then when she came out felt very weak and tired. This was followed by an episode of syncope when she walked down to the dining area. There are states that she was unresponsive while the EMS was activated. She states that patient did not wake up until she reached [facility] She was found to be mildly confused on initial evaluation in the emergency department. CT scan of the head was negative for any acute bleed or old stroke. At the request of the patient she is transferred to [facility] for further evaluation. Patient is presently doing well and states that she's back to her baseline. 2 of her daughters are present at the bedside. No chest pain prior to the episode of syncope, no shortness of breath, no recent PND or orthopnea. No incontinence , no tongue bite or injury reported.  Past Medical History  Diagnosis Date  . Arthritis   . Hypertension   . Migraine   . Colon polyp   . Hearing difficulty   . Torus palatinus   . Atrial flutter January, 2012    Past Surgical History  Procedure Laterality Date  . Appendectomy  02/19/53    `  . Tonsillectomy  02/15/1934  . Cataract extraction  2006, 2008  . Ganglion cyst excision  1938,1954,2003,2005  . Cardioversion  05/19/2010    Dr. Jacinto Halim    Family History  Problem Relation Age of Onset  . Colon cancer    . Breast cancer    . Brain cancer    . Cancer Mother     breast  . Aneurysm Mother     brain  .  Heart attack Neg Hx   . Diabetes Neg Hx   . Hypertension Neg Hx    Social History:  reports that she has quit smoking. She has never used smokeless tobacco. She reports that she drinks about 0.6 ounces of alcohol per week. She reports that she does not use illicit drugs.  Allergies:  Allergies  Allergen Reactions  . Alendronate Sodium     REACTION: rash and puffy eyes  . Tramadol Hcl     REACTION: lethargy, nausea    Medications Prior to Admission  Medication Sig Dispense Refill  . aspirin-acetaminophen-caffeine (EXCEDRIN MIGRAINE) 250-250-65 MG per tablet Take 1 tablet by mouth as needed.        . Rivaroxaban (XARELTO) 20 MG TABS Take 20 mg by mouth daily with supper.       . [DISCONTINUED] flecainide (TAMBOCOR) 50 MG tablet Take 50 mg by mouth 2 (two) times daily.          Review of Systems -   Blood pressure 113/65, pulse 84, temperature 98.1 F (36.7 C), temperature source Oral, resp. rate 18, height 5\' 6"  (1.676 m), weight 54.8 kg (120 lb 13 oz). Orthostatic hypotension noted. 15 mm Hg drop in BP. HR minimal change with orthostasis.   General: Moderately build and petite body habitus  who is in no acute distress. Appears stated age. Alert Ox3.   There is no cyanosis. HEENT: normal limits. PERRLA, No JVD.   CARDIAC EXAM: S1, S2 normal, no gallop present. No murmur.   CHEST EXAM: No tenderness of chest wall. LUNGS: Clear to percuss and auscultate.  ABDOMEN: No hepatosplenomegaly. BS normal in all 4 quadrants. Abdomen is non-tender.   EXTREMITY: Full range of movementes, No edema. MUSCULOSKELETAL EXAM: Intact with full range of motion in all 4 extremities.   NEUROLOGIC EXAM: Grossly intact without any focal deficits. Alert O x 3.   VASCULAR EXAM: No skin breakdown. Carotids normal. Extremities: Femoral pulse normal. Popliteal pulse normal ; Pedal pulse normal. Otherwise No prominent pulse felt in the abdomen. No varicose veins.  No results found for this  or any previous visit (from the past 48 hour(s)). No results found.  Labs:   Lab Results  Component Value Date   WBC 7.7 09/12/2010   HGB 12.7 09/12/2010   HCT 38.4 09/12/2010   MCV 86.3 09/12/2010   PLT 189 09/12/2010   No results found for this basename: NA, K, CL, CO2, BUN, CREATININE, CALCIUM, LABALBU, PROT, BILITOT, ALKPHOS, ALT, AST, GLUCOSE,  in the last 168 hours Lab Results  Component Value Date   CKTOTAL 26 04/15/2010   CKMB 1.3 04/15/2010   TROPONINI  Value: 0.02        NO INDICATION OF MYOCARDIAL INJURY. 04/15/2010    Lipid Panel     Component Value Date/Time   CHOL 173 12/31/2009 2016   TRIG 75 12/31/2009 2016   HDL 57 12/31/2009 2016   CHOLHDL 3.0 Ratio 12/31/2009 2016   VLDL 15 12/31/2009 2016   LDLCALC 101* 12/31/2009 2016    EKG: Tele: A. Flutter with variable AV conduction.   Assessment/Plan 1. Syncope most probably do to orthostatic hypotension. Most episodes occurring after a hot shower. 2. Chronic atrial flutter, previously evaluated by Dr. Hillis Range, patient preferred medical therapy. 3. Orthostatic hypotension and supine hypertension  Recommendation:  I discussed primary/secondary prevention and also dietary counceling was done. I have discussed with the patient and her daughter at the bedside. Her symptoms of dizziness and near syncopal spells without syncope are clearly related to what appears like orthostatic changes. Most of the episodes are happening when she gets up in the morning, takes a warm shower gets stressed and placed walked down the stairs she feels dizzy like she is to pass out. At other times during day she rarely has these episodes. And most episodes are occurring when she is standing up. Hence I discussed with him regarding the options of midodrine to be taken on a when necessary basis. Florinef had caused leg edema in the past and she was not wearing support stockings when the event occurred.  With regard to atrial flutter, I'm not convinced that  her symptoms of syncope are related to atrial flutter, Sick sinus syndrome needs to be excluded. i will observe her overnight for bradycardia/heart block.  If they so choose to proceed with atrial flutter ablation, we may have an advantage that if she indeed maintains sinus rhythm or 6 months to a year, we may consider stopping long-term anticoagulation. Patient's risk of bleed will continue to increase with age so will the risk of stroke, but the only issue is orthostatic hypotension, syncopal spells and injury. I have discussed these issues with the patient and her 2 daughters at bedside. She is on chronic flecainide for HR control and previously  did not tolerate BB or CCB due to worsening orthostatic hypotension.  Pamella Pert, MD 06/01/2012, 6:01 PM [facility] Pager: [phone] Office: [phone] If no answer: Cell:  [phone]

## 2012-06-01 NOTE — Progress Notes (Addendum)
Pt transferred from [facility] ER by EMS, bedside report given by EMS to RN. Pt in bed, assisted to bathroom, placed on telemetry. Only belongings are jeans and underwear. Pt on RA, VSS.   Delynn Flavin, RN, BSN

## 2012-06-02 MED ORDER — MIDODRINE HCL 5 MG PO TABS
5.0000 mg | ORAL_TABLET | Freq: Three times a day (TID) | ORAL | Status: DC | PRN
  Filled 2012-06-02 (×3): qty 1

## 2012-06-02 MED ORDER — MIDODRINE HCL 5 MG PO TABS: 5.0000 mg | ORAL_TABLET | Freq: Three times a day (TID) | ORAL | Status: DC | PRN

## 2012-06-02 NOTE — Progress Notes (Signed)
Discharge instructions reviewed with patient and family.  Medication changes and follow up appointments reviewed. All voice understanding to teaching.   My Chart explained to patient and family.  Family states that they will help her set up her account.  To door via wheelchair.  Home via POV with her daughter driving.

## 2012-06-02 NOTE — Discharge Summary (Signed)
Physician Discharge Summary  Patient ID: [name] MRN: [id number] DOB/AGE: [id number] 77 y.o.  Admit date: 06/01/2012 Discharge date: 06/02/2012  Primary Discharge Diagnosis Syncope due to orthostatic hypotension Secondary Discharge Diagnosis Atrial flutter  Significant Diagnostic Studies: None  Hospital Course:  Patient is 79 Caucasian female with history of chronic atrial flutter, with history of prior syncope and severe orthostatic hypotension who had been doing well and had seen her just a few days ago in my office. She has been having recurrent episodes or dizziness and most of her episodes have been occurring after she taken a hot bath and comes down for breakfast. Similarly today this morning, she felt dizzy just prior to taking a shower and then when she came out felt very weak and tired. This was followed by an episode of syncope when she walked down to the dining area. There are states that she was unresponsive while the EMS was activated. She states that patient did not wake up until she reached [facility]. She was found to be mildly confused on initial evaluation in the emergency department. CT scan of the head was negative for any acute bleed or old stroke. At the request of the patient she is transferred to [facility] for further evaluation.  Patient was mildly orthostatic while she was in the hospital. There was no significant arrhythmias or heart block except for underlying atrial flutter. Hence patient was felt stable for discharge with the addition of midodrine to her present medical regimen. I have instructed the patient and 3 of her daughters at the bedside specifically regarding the fall risk and bleed risk with patient being on Xarelto. Patient is presently only on Xarelto and not on Flecainide as previously reported.   Discharge Exam: Blood pressure 136/72, pulse 79, temperature 97.6 F (36.4 C), temperature source Oral, resp. rate 20, height  5\' 6"  (1.676 m), weight 54.8 kg (120 lb 13 oz), SpO2 95.00%.   General: Moderately build and petite body habitus who is in no acute distress. Appears stated age. Alert Ox3.  There is no cyanosis. HEENT: normal limits. PERRLA, No JVD.  CARDIAC EXAM: S1, S2 normal, no gallop present. No murmur.  CHEST EXAM: No tenderness of chest wall. LUNGS: Clear to percuss and auscultate.  ABDOMEN: No hepatosplenomegaly. BS normal in all 4 quadrants. Abdomen is non-tender.  EXTREMITY: Full range of movementes, No edema. MUSCULOSKELETAL EXAM: Intact with full range of motion in all 4 extremities.  NEUROLOGIC EXAM: Grossly intact without any focal deficits. Alert O x 3.  VASCULAR EXAM: No skin breakdown. Carotids normal. Extremities: Femoral pulse normal. Popliteal pulse normal ; Pedal pulse normal. Otherwise No prominent pulse felt in the abdomen. No varicose veins.   JXB:JYNW: A. Flutter. No heart block.   FOLLOW UP PLANS AND APPOINTMENTS    Medication List    STOP taking these medications       flecainide 50 MG tablet  Commonly known as:  TAMBOCOR      TAKE these medications       aspirin-acetaminophen-caffeine 250-250-65 MG per tablet  Commonly known as:  EXCEDRIN MIGRAINE  Take 1 tablet by mouth as needed.     midodrine 5 MG tablet  Commonly known as:  PROAMATINE  Take 1 tablet (5 mg total) by mouth 3 (three) times daily with meals as needed (While awake for dizziness).     XARELTO 20 MG Tabs  Generic drug:  Rivaroxaban  Take 20 mg by mouth daily  with supper.           Follow-up Information   Follow up with Pamella Pert, MD. (keep previous appointment and call as needed)    Contact information:   [address] [place] [address] [phone]        Pamella Pert, MD 06/02/2012, 11:15 AM  Pager: [phone] Office: [phone] If no answer: [phone]

## 2012-08-01 ENCOUNTER — Observation Stay (HOSPITAL_COMMUNITY): Payer: Medicare Other

## 2012-08-01 ENCOUNTER — Encounter (HOSPITAL_BASED_OUTPATIENT_CLINIC_OR_DEPARTMENT_OTHER): Payer: Self-pay | Admitting: *Deleted

## 2012-08-01 ENCOUNTER — Emergency Department (HOSPITAL_BASED_OUTPATIENT_CLINIC_OR_DEPARTMENT_OTHER): Payer: Medicare Other

## 2012-08-01 ENCOUNTER — Inpatient Hospital Stay (HOSPITAL_BASED_OUTPATIENT_CLINIC_OR_DEPARTMENT_OTHER)
Admission: EM | Admit: 2012-08-01 | Discharge: 2012-08-03 | DRG: 065 | Disposition: A | Discharge status: Home Health | Payer: Medicare Other | Attending: Internal Medicine | Admitting: Internal Medicine

## 2012-08-01 DIAGNOSIS — G8190 Hemiplegia, unspecified affecting unspecified side: Secondary | ICD-10-CM | POA: Diagnosis present

## 2012-08-01 DIAGNOSIS — M899 Disorder of bone, unspecified: Secondary | ICD-10-CM

## 2012-08-01 DIAGNOSIS — Z7901 Long term (current) use of anticoagulants: Secondary | ICD-10-CM

## 2012-08-01 DIAGNOSIS — N201 Calculus of ureter: Secondary | ICD-10-CM

## 2012-08-01 DIAGNOSIS — I6330 Cerebral infarction due to thrombosis of unspecified cerebral artery: Principal | ICD-10-CM | POA: Diagnosis present

## 2012-08-01 DIAGNOSIS — I10 Essential (primary) hypertension: Secondary | ICD-10-CM | POA: Diagnosis present

## 2012-08-01 DIAGNOSIS — Z86010 Personal history of colonic polyps: Secondary | ICD-10-CM

## 2012-08-01 DIAGNOSIS — R55 Syncope and collapse: Secondary | ICD-10-CM

## 2012-08-01 DIAGNOSIS — J069 Acute upper respiratory infection, unspecified: Secondary | ICD-10-CM

## 2012-08-01 DIAGNOSIS — M159 Polyosteoarthritis, unspecified: Secondary | ICD-10-CM | POA: Diagnosis not present

## 2012-08-01 DIAGNOSIS — G459 Transient cerebral ischemic attack, unspecified: Secondary | ICD-10-CM

## 2012-08-01 DIAGNOSIS — I4891 Unspecified atrial fibrillation: Secondary | ICD-10-CM | POA: Diagnosis not present

## 2012-08-01 DIAGNOSIS — G43909 Migraine, unspecified, not intractable, without status migrainosus: Secondary | ICD-10-CM | POA: Diagnosis not present

## 2012-08-01 DIAGNOSIS — I951 Orthostatic hypotension: Secondary | ICD-10-CM | POA: Diagnosis not present

## 2012-08-01 DIAGNOSIS — I369 Nonrheumatic tricuspid valve disorder, unspecified: Secondary | ICD-10-CM | POA: Diagnosis not present

## 2012-08-01 DIAGNOSIS — H269 Unspecified cataract: Secondary | ICD-10-CM

## 2012-08-01 DIAGNOSIS — M25519 Pain in unspecified shoulder: Secondary | ICD-10-CM

## 2012-08-01 DIAGNOSIS — Z87891 Personal history of nicotine dependence: Secondary | ICD-10-CM | POA: Diagnosis not present

## 2012-08-01 DIAGNOSIS — R29810 Facial weakness: Secondary | ICD-10-CM | POA: Diagnosis present

## 2012-08-01 DIAGNOSIS — R4789 Other speech disturbances: Secondary | ICD-10-CM | POA: Diagnosis not present

## 2012-08-01 DIAGNOSIS — I672 Cerebral atherosclerosis: Secondary | ICD-10-CM | POA: Diagnosis not present

## 2012-08-01 DIAGNOSIS — R4781 Slurred speech: Secondary | ICD-10-CM | POA: Diagnosis present

## 2012-08-01 DIAGNOSIS — M129 Arthropathy, unspecified: Secondary | ICD-10-CM | POA: Diagnosis present

## 2012-08-01 DIAGNOSIS — I4892 Unspecified atrial flutter: Secondary | ICD-10-CM | POA: Diagnosis not present

## 2012-08-01 DIAGNOSIS — H9190 Unspecified hearing loss, unspecified ear: Secondary | ICD-10-CM

## 2012-08-01 DIAGNOSIS — I6350 Cerebral infarction due to unspecified occlusion or stenosis of unspecified cerebral artery: Secondary | ICD-10-CM | POA: Diagnosis not present

## 2012-08-01 DIAGNOSIS — I7389 Other specified peripheral vascular diseases: Secondary | ICD-10-CM | POA: Diagnosis present

## 2012-08-01 DIAGNOSIS — R4701 Aphasia: Secondary | ICD-10-CM | POA: Diagnosis not present

## 2012-08-01 DIAGNOSIS — M949 Disorder of cartilage, unspecified: Secondary | ICD-10-CM

## 2012-08-01 DIAGNOSIS — I639 Cerebral infarction, unspecified: Secondary | ICD-10-CM

## 2012-08-01 DIAGNOSIS — R112 Nausea with vomiting, unspecified: Secondary | ICD-10-CM

## 2012-08-01 LAB — CBC
MCH: 30.2 pg (ref 26.0–34.0)
MCV: 90.1 fL (ref 78.0–100.0)
Platelets: 165 10*3/uL (ref 150–400)
RBC: 4.86 MIL/uL (ref 3.87–5.11)
RDW: 14.3 % (ref 11.5–15.5)
WBC: 6.2 10*3/uL (ref 4.0–10.5)

## 2012-08-01 LAB — URINALYSIS, ROUTINE W REFLEX MICROSCOPIC
Hgb urine dipstick: NEGATIVE
Nitrite: NEGATIVE
Protein, ur: NEGATIVE mg/dL
Specific Gravity, Urine: 1.012 (ref 1.005–1.030)
Urobilinogen, UA: 0.2 mg/dL (ref 0.0–1.0)

## 2012-08-01 LAB — DIFFERENTIAL
Basophils Absolute: 0 10*3/uL (ref 0.0–0.1)
Eosinophils Absolute: 0.1 10*3/uL (ref 0.0–0.7)
Eosinophils Relative: 1 % (ref 0–5)
Neutrophils Relative %: 69 % (ref 43–77)

## 2012-08-01 LAB — COMPREHENSIVE METABOLIC PANEL
ALT: 11 U/L (ref 0–35)
AST: 24 U/L (ref 0–37)
Albumin: 4 g/dL (ref 3.5–5.2)
Alkaline Phosphatase: 69 U/L (ref 39–117)
Calcium: 9.4 mg/dL (ref 8.4–10.5)
GFR calc Af Amer: 58 mL/min — ABNORMAL LOW (ref >90)
Potassium: 4.1 mEq/L (ref 3.5–5.1)
Sodium: 144 mEq/L (ref 135–145)
Total Protein: 7 g/dL (ref 6.0–8.3)

## 2012-08-01 LAB — ETHANOL: Alcohol, Ethyl (B): <11 mg/dL (ref 0–11)

## 2012-08-01 LAB — PROTIME-INR
INR: 1.23 (ref 0.00–1.49)
Prothrombin Time: 15.3 seconds — ABNORMAL HIGH (ref 11.6–15.2)

## 2012-08-01 MED ORDER — SODIUM CHLORIDE 0.9 % IV SOLN
INTRAVENOUS | Status: DC
  Administered 2012-08-01: 20:00:00 via INTRAVENOUS

## 2012-08-01 MED ORDER — MIDODRINE HCL 5 MG PO TABS
5.0000 mg | ORAL_TABLET | Freq: Three times a day (TID) | ORAL | Status: DC | PRN
  Filled 2012-08-01 (×3): qty 1

## 2012-08-01 MED ORDER — RIVAROXABAN 20 MG PO TABS
20.0000 mg | ORAL_TABLET | Freq: Every day | ORAL | Status: DC
  Administered 2012-08-01: 20 mg via ORAL
  Filled 2012-08-01 (×2): qty 1

## 2012-08-01 MED ORDER — ASPIRIN-ACETAMINOPHEN-CAFFEINE 250-250-65 MG PO TABS
1.0000 | ORAL_TABLET | Freq: Three times a day (TID) | ORAL | Status: DC | PRN
  Filled 2012-08-01: qty 1

## 2012-08-01 MED ORDER — LORATADINE 10 MG PO TABS
10.0000 mg | ORAL_TABLET | Freq: Every day | ORAL | Status: DC
  Administered 2012-08-01 – 2012-08-03 (×3): 10 mg via ORAL
  Filled 2012-08-01 (×3): qty 1

## 2012-08-01 MED ORDER — ASPIRIN 81 MG PO CHEW: 324.0000 mg | CHEWABLE_TABLET | Freq: Once | ORAL | Status: DC

## 2012-08-01 MED ORDER — ACETAMINOPHEN 325 MG PO TABS: 650.0000 mg | ORAL_TABLET | ORAL | Status: DC | PRN

## 2012-08-01 MED ORDER — STROKE: EARLY STAGES OF RECOVERY BOOK
Freq: Once | Status: AC
  Administered 2012-08-01: 20:00:00
  Filled 2012-08-01: qty 1

## 2012-08-01 MED ORDER — MUSCLE RUB 10-15 % EX CREA: TOPICAL_CREAM | CUTANEOUS | Status: DC | PRN

## 2012-08-01 NOTE — H&P (Signed)
[facility] History and Physical  [name] WUJ:[id number] DOB: 1924/06/25 DOA: 08/01/2012  Referring physician: Dr. Bernette Mayers PCP: Letitia Libra, Ala Dach, MD  Specialists: Dr. Jacinto Halim, Cardiology  Chief Complaint: slurred speech and ataxia  HPI: [name] is a 79 y.o. female  with history of chronic atrial flutter on Xeralto, with history of prior syncope and severe orthostatic hypotension.   She was feeling in her usual state of health this morning and went to the beauty salon for her pedicure.  She did not wear her TED hose for orthostatic hypotension because she was going to have a foot massage.  While at the salon between 10 - 11 she developed difficulty speaking and then noticed she was having difficulty walking.  She had never had symptoms like this before.  She was hospitalized in March 2014 with Orthostatic hypotension causing syncope and was started on PRN midodrine.  Per her family her SBP is high when lying down but low when standing.  This am she had not taken any midodrine today.  Patient was brought to the ED as a code stroke.  She was outside the window and on Xeralto - so no TPA was given.  In the ED her symptoms have virtually resolved.  She is noted to have minimal trouble forming words, minimal left sided facial droop and minimal left sided weakness.    Review of Systems: She has chronic back pain with movement and occasionally has migraines. The patient denies anorexia, fever, weight loss,, vision loss, decreased hearing, hoarseness, chest pain, syncope, dyspnea on exertion, peripheral edema, balance deficits, hemoptysis, abdominal pain, melena, hematochezia, severe indigestion/heartburn, hematuria, incontinence, genital sores, suspicious skin lesions, transient blindness, difficulty walking, depression, unusual weight change, abnormal bleeding, enlarged lymph nodes, angioedema, and breast masses.    Past Medical History  Diagnosis Date  . Arthritis   . Hypertension    . Migraine   . Colon polyp   . Hearing difficulty   . Torus palatinus   . Atrial flutter January, 2012   Past Surgical History  Procedure Laterality Date  . Appendectomy  02/19/53    `  . Tonsillectomy  02/15/1934  . Cataract extraction  2006, 2008  . Ganglion cyst excision  1938,1954,2003,2005  . Cardioversion  05/19/2010    Dr. Jacinto Halim   Social History:  reports that she has quit smoking. She has never used smokeless tobacco. She reports that she drinks about 0.6 ounces of alcohol per week. She reports that she does not use illicit drugs. She lives with her 3 daughters who cook for her, but she performs her own bathes and dresses herself.  Allergies  Allergen Reactions  . Alendronate Sodium     REACTION: rash and puffy eyes  . Tramadol Hcl     REACTION: lethargy, nausea    Family History  Problem Relation Age of Onset  . Colon cancer    . Breast cancer    . Brain cancer    . Cancer Mother     breast  . Aneurysm Mother     brain  . Heart attack Neg Hx   . Diabetes Neg Hx   . Hypertension Neg Hx     Prior to Admission medications   Medication Sig Start Date End Date Taking? Authorizing Provider  aspirin-acetaminophen-caffeine (EXCEDRIN MIGRAINE) [phone] MG per tablet Take 1 tablet by mouth as needed.      Historical Provider, MD  midodrine (PROAMATINE) 5 MG tablet Take 1 tablet (5 mg total)  by mouth 3 (three) times daily with meals as needed (While awake for dizziness). 06/02/12   Pamella Pert, MD  Rivaroxaban (XARELTO) 20 MG TABS Take 20 mg by mouth daily with supper.     Historical Provider, MD   Physical Exam: Filed Vitals:   08/01/12 1342 08/01/12 1403 08/01/12 1449 08/01/12 1508  BP: 154/73  173/91   Pulse: 66  82   Temp:  97.9 F (36.6 C)  97.7 F (36.5 C)  TempSrc:    Oral  Resp: 19  16   Weight:      SpO2: 98%  97%      General:  Elderly, frail, female, talkative and NAD  Eyes: conjunctiva Pink, pupils equal and round  ENT: Oropharynx  pink, Tongue with purpura on the dorsal side, MMM  Neck: supple, no lymphadenopathy  Cardiovascular: irregular, no obvious m/r/g  Respiratory: cta no w/c/r  Abdomen: thin, no masses, soft, nt, nd, +BS  Skin: no obvious rashes or bruising  Musculoskeletal: slight left sided weakness compared to right.  Psychiatric: HOH, A&O, NAD, Cooperative.  Neurologic: A&O, No tongue deviation, smile symmetric, good strength in all four extremities but slight weakness noted on the left sided.  Some difficulty with pronouncing words.  Labs on Admission:  Basic Metabolic Panel:  Recent Labs Lab 08/01/12 1320  NA 144  K 4.1  CL 106  CO2 29  GLUCOSE 154*  BUN 16  CREATININE 1.00  CALCIUM 9.4   Liver Function Tests:  Recent Labs Lab 08/01/12 1320  AST 24  ALT 11  ALKPHOS 69  BILITOT 0.5  PROT 7.0  ALBUMIN 4.0   CBC:  Recent Labs Lab 08/01/12 1320  WBC 6.2  NEUTROABS 4.2  HGB 14.7  HCT 43.8  MCV 90.1  PLT 165   Cardiac Enzymes:  Recent Labs Lab 08/01/12 1320  TROPONINI <0.30   CBG:  Recent Labs Lab 08/01/12 1402  GLUCAP 141*    Radiological Exams on Admission: Ct Head Wo Contrast  08/01/2012  *RADIOLOGY REPORT*  Clinical Data: Slurred speech.  Code stroke.  CT HEAD WITHOUT CONTRAST  Technique:  Contiguous axial images were obtained from the base of the skull through the vertex without contrast.  Comparison: 05/10/2010.  Findings: No intracranial hemorrhage.  Prominent small vessel disease type changes without CT evidence of large acute infarct.  Calcified pineal gland slightly heterogeneous in appearance unchanged.  No other intracranial mass lesion detected on this unenhanced exam.  Global atrophy without hydrocephalus.  Vascular calcifications.  Mastoid air cells, middle ear cavities and visualized sinuses are clear.  IMPRESSION: No intracranial hemorrhage.  Prominent small vessel disease type changes without CT evidence of large acute infarct.  Please see above.   Code stroke called to Clydie Braun physician's assistant 08/01/2012 1:58 p.m.   Original Report Authenticated By: Lacy Duverney, M.D.     EKG: Independently reviewed. Aflutter   Assessment/Plan Active Problems:   MIGRAINE HEADACHE   GEN OSTEOARTHROSIS INVOLVING MULTIPLE SITES   Atrial flutter   Orthostatic hypotension   TIA (transient ischemic attack)   Possible TIA  Have requested TIA work up minus MRI/MRA (neuro to order) Seen by Neurology On Xeralto Likely d/c tomorrow if all testing is negative  Recent Orthostatic hypotension - could have contributed to current symptoms Difficult to treat (with elevated SBP when supine) On PRN Midodrine and Ted hose Check orthostatics daily, request PT evaluation  Aflutter On Xeralto Managed by Dr. Jacinto Halim  Hypertension Not on medications at home Will  allow "permission" hypertension in the hospital  Hydralazine PRN for SBP >180  Migraine Excedrin Migraine PRN  Osteoarthritis Tylenol Kpad Ben gay No Narcotics - per daughter's mother has altered mental status with narcotics.   Code Status: Full, but does not want long term life support Family Communication:  3 daughters at bedside.  1 daughter is a Chiropodist Set designer) Disposition Plan: observation.  PT eval pending  Time spent: 62  Stephani Police [facility] Pager [phone]  If 7PM-7AM, please contact night-coverage [email or web address] Password [email or web address] 08/01/2012, 3:52 PM

## 2012-08-01 NOTE — Consult Note (Signed)
Referring Physician: Dr. Bernette Mayers    Chief Complaint: trouble speaking  HPI: [name] is an 79 y.o. female who LKW by telephone around 0730 on 07/29/2012. She went to the Salon (included neck massage) where they felt like she was having difficulty speaking. That person did call the daughter to tell her about her mom. The patient also says that she felt that she had delay in her speech and was slower in ambulation. Once she looked in the mirror, she also noticed her facial droop. Daughter drove her to [facility]. Patient is on xarelto for afib/flutter (Dr. Jacinto Halim). CT was non-acute. Patient was however called as code stroke; however, sx resolving upon transfer to [facility]. Patient has migraines of note. No complaints of neck pain or dizziness.  Date last known well: 08/01/2012 Time last known well: 0730  tPA Given: No: Symptoms improving (pt on xarelto)  Past Medical History  Diagnosis Date  . Arthritis   . Hypertension   . Migraine   . Colon polyp   . Hearing difficulty   . Torus palatinus   . Atrial flutter January, 2012    Past Surgical History  Procedure Laterality Date  . Appendectomy  02/19/53    `  . Tonsillectomy  02/15/1934  . Cataract extraction  2006, 2008  . Ganglion cyst excision  1938,1954,2003,2005  . Cardioversion  05/19/2010    Dr. Jacinto Halim    Family History  Problem Relation Age of Onset  . Colon cancer    . Breast cancer    . Brain cancer    . Cancer Mother     breast  . Aneurysm Mother     brain  . Heart attack Neg Hx   . Diabetes Neg Hx   . Hypertension Neg Hx    Social History:  reports that she has quit smoking. She has never used smokeless tobacco. She reports that she drinks about 0.6 ounces of alcohol per week. She reports that she does not use illicit drugs.  Allergies:  Allergies  Allergen Reactions  . Alendronate Sodium     REACTION: rash and puffy eyes  . Tramadol Hcl     REACTION: lethargy, nausea   No current facility-administered  medications for this encounter. Current outpatient prescriptions:aspirin-acetaminophen-caffeine (EXCEDRIN MIGRAINE) 250-250-65 MG per tablet, Take 1 tablet by mouth as needed.  , Disp: , Rfl: ;  midodrine (PROAMATINE) 5 MG tablet, Take 1 tablet (5 mg total) by mouth 3 (three) times daily with meals as needed (While awake for dizziness)., Disp: 90 tablet, Rfl: 6;  Rivaroxaban (XARELTO) 20 MG TABS, Take 20 mg by mouth daily with supper. , Disp: , Rfl:   ROS: History obtained from chart review and the patient  General ROS: negative for - chills, fatigue, fever, night sweats, weight gain or weight loss Psychological ROS: negative for - behavioral disorder, hallucinations, memory difficulties, mood swings or suicidal ideation Ophthalmic ROS: negative for - blurry vision, double vision, eye pain or loss of vision ENT ROS: negative for - epistaxis, nasal discharge, oral lesions, sore throat, tinnitus or vertigo Allergy and Immunology ROS: negative for - hives or itchy/watery eyes Hematological and Lymphatic ROS: negative for - bleeding problems, bruising or swollen lymph nodes Endocrine ROS: negative for - galactorrhea, hair pattern changes, polydipsia/polyuria or temperature intolerance Respiratory ROS: negative for - cough, hemoptysis, shortness of breath or wheezing Cardiovascular ROS: negative for - chest pain, dyspnea on exertion, edema, +++ irregular heartbeat Gastrointestinal ROS: negative for - abdominal pain,  diarrhea, hematemesis, nausea/vomiting or stool incontinence Genito-Urinary ROS: negative for - dysuria, hematuria, incontinence or urinary frequency/urgency Musculoskeletal ROS: negative for - joint swelling or muscular weakness Neurological ROS: as noted in HPI Dermatological ROS: negative for rash and skin lesion changes   Physical Examination: Blood pressure 173/91, pulse 82, temperature 97.7 F (36.5 C), temperature source Oral, resp. rate 16, weight 56.7 kg (125 lb), SpO2  97.00%.  Neurologic Examination: Mental Status: Alert, oriented, thought content appropriate.  Speech fluent without evidence of aphasia.  Able to follow 3 step commands without difficulty. Cranial Nerves: II: visual fields grossly normal, pupils equal, round III,IV, VI: ptosis not present, extra-ocular motions intact bilaterally V,VII: smile asymmetric, facial light touch decreased left face VIII: hard of hearing Motor: Right : Upper extremity   5/5    Left:     Upper extremity   5/5  Lower extremity   4/5 (drift)    Lower extremity   5/5 Sensory: Pinprick and light touch diminished left body Deep Tendon Reflexes: 2+LE Plantars: Right: downgoing   Left: downgoing Cerebellar: normal finger-to-nose    Results for orders placed during the hospital encounter of 08/01/12 (from the past 48 hour(s))  ETHANOL     Status: None   Collection Time    08/01/12  1:20 PM      Result Value Range   Alcohol, Ethyl (B) <11  0 - 11 mg/dL   Comment: REPEATED TO VERIFY                LOWEST DETECTABLE LIMIT FOR     SERUM ALCOHOL IS 11 mg/dL     FOR MEDICAL PURPOSES ONLY  PROTIME-INR     Status: Abnormal   Collection Time    08/01/12  1:20 PM      Result Value Range   Prothrombin Time 15.3 (*) 11.6 - 15.2 seconds   INR 1.23  0.00 - 1.49  APTT     Status: None   Collection Time    08/01/12  1:20 PM      Result Value Range   aPTT 35  24 - 37 seconds  CBC     Status: None   Collection Time    08/01/12  1:20 PM      Result Value Range   WBC 6.2  4.0 - 10.5 K/uL   RBC 4.86  3.87 - 5.11 MIL/uL   Hemoglobin 14.7  12.0 - 15.0 g/dL   HCT 16.1  09.6 - 04.5 %   MCV 90.1  78.0 - 100.0 fL   MCH 30.2  26.0 - 34.0 pg   MCHC 33.6  30.0 - 36.0 g/dL   RDW 40.9  81.1 - 91.4 %   Platelets 165  150 - 400 K/uL  DIFFERENTIAL     Status: None   Collection Time    08/01/12  1:20 PM      Result Value Range   Neutrophils Relative 69  43 - 77 %   Neutro Abs 4.2  1.7 - 7.7 K/uL   Lymphocytes Relative 20   12 - 46 %   Lymphs Abs 1.2  0.7 - 4.0 K/uL   Monocytes Relative 10  3 - 12 %   Monocytes Absolute 0.6  0.1 - 1.0 K/uL   Eosinophils Relative 1  0 - 5 %   Eosinophils Absolute 0.1  0.0 - 0.7 K/uL   Basophils Relative 1  0 - 1 %   Basophils Absolute 0.0  0.0 - 0.1  K/uL  COMPREHENSIVE METABOLIC PANEL     Status: Abnormal   Collection Time    08/01/12  1:20 PM      Result Value Range   Sodium 144  135 - 145 mEq/L   Potassium 4.1  3.5 - 5.1 mEq/L   Chloride 106  96 - 112 mEq/L   CO2 29  19 - 32 mEq/L   Glucose, Bld 154 (*) 70 - 99 mg/dL   BUN 16  6 - 23 mg/dL   Creatinine, Ser 1.61  0.50 - 1.10 mg/dL   Calcium 9.4  8.4 - 09.6 mg/dL   Total Protein 7.0  6.0 - 8.3 g/dL   Albumin 4.0  3.5 - 5.2 g/dL   AST 24  0 - 37 U/L   ALT 11  0 - 35 U/L   Alkaline Phosphatase 69  39 - 117 U/L   Total Bilirubin 0.5  0.3 - 1.2 mg/dL   GFR calc non Af Amer 50 (*) >90 mL/min   GFR calc Af Amer 58 (*) >90 mL/min   Comment:            The eGFR has been calculated     using the CKD EPI equation.     This calculation has not been     validated in all clinical     situations.     eGFR's persistently     <90 mL/min signify     possible Chronic Kidney Disease.  TROPONIN I     Status: None   Collection Time    08/01/12  1:20 PM      Result Value Range   Troponin I <0.30  <0.30 ng/mL   Comment:            Due to the release kinetics of cTnI,     a negative result within the first hours     of the onset of symptoms does not rule out     myocardial infarction with certainty.     If myocardial infarction is still suspected,     repeat the test at appropriate intervals.  GLUCOSE, CAPILLARY     Status: Abnormal   Collection Time    08/01/12  2:02 PM      Result Value Range   Glucose-Capillary 141 (*) 70 - 99 mg/dL   Ct Head Wo Contrast 0/06/5407  No intracranial hemorrhage.  Prominent small vessel disease type changes without CT evidence of large acute infarct.  Please see above.  Code stroke called  to Clydie Braun physician's assistant 08/01/2012 1:58 p.m.   Original Report Authenticated By: Lacy Duverney, M.D.    Assessment: 79 y.o. female with left hemi-sensory deficit, LLE hemiparesis, facial droop and transient dysarthria (this cleared). Symptoms improved since transfer from [facility]. Patient in a flutter and is on xarelto for this. Not tPA candidate. Will continue stroke workup.   Stroke Risk Factors - atrial fibrillation, hypertension   Plan: 1. HgbA1c, fasting lipid panel 2. MRI, MRA  of the brain without contrast 3. PT consult, OT consult, Speech consult 4. Echocardiogram 5. Carotid dopplers 6. Prophylactic therapy-on xarelto at home, continue 7. Risk factor modification 8. Telemetry monitoring 9. Frequent neuro checks  Guy Franco PA-C, MBA, MHA [facility] Pager [phone]  I personally participated in this patient's evaluation and management and approved the above clinical assessment and treatment recommendations.  Venetia Maxon M.D. [facility] [phone]  08/01/2012, 3:14 PM

## 2012-08-01 NOTE — ED Notes (Signed)
MD at bedside. 

## 2012-08-01 NOTE — ED Notes (Signed)
Pt transferred from [facility] with stroke but not for active treatment.

## 2012-08-01 NOTE — H&P (Signed)
Addendum  Patient seen and examined, chart and data base reviewed.  I agree with the above assessment and plan.  For full details please see Mrs. Algis Downs PA note.  Slurred speech and facial droop, rule out TIA/CVA.   Has atrial fibrillation/flutter on Xarelto.   Clint Lipps, MD [facility] Pager: [phone] 08/01/2012, 6:18 PM

## 2012-08-01 NOTE — ED Provider Notes (Signed)
History     CSN: [id number]  Arrival date & time 08/01/12  1311   First MD Initiated Contact with Patient 08/01/12 1316      Chief Complaint  Patient presents with  . Aphasia    (Consider location/radiation/quality/duration/timing/severity/associated sxs/prior treatment) The history is provided by the patient and a relative.   79 year old female returned from her beauty salon and her daughter noted that she was walking funny and that her speech was not quite right. Speech seemed slightly slurred and slow. She talked with people at the beauty salon who also noted that the patient was not herself. Another daughter had seen the patient at 7:30 AM at which time she was walking and speaking normally. She has a history of atrial fibrillation for which she is on Rivaroxaban. She denies headache, chest pain, nausea, vomiting. She has not had any aspirin today.  Past Medical History  Diagnosis Date  . Arthritis   . Hypertension   . Migraine   . Colon polyp   . Hearing difficulty   . Torus palatinus   . Atrial flutter January, 2012    Past Surgical History  Procedure Laterality Date  . Appendectomy  02/19/53    `  . Tonsillectomy  02/15/1934  . Cataract extraction  2006, 2008  . Ganglion cyst excision  1938,1954,2003,2005  . Cardioversion  05/19/2010    Dr. Jacinto Halim    Family History  Problem Relation Age of Onset  . Colon cancer    . Breast cancer    . Brain cancer    . Cancer Mother     breast  . Aneurysm Mother     brain  . Heart attack Neg Hx   . Diabetes Neg Hx   . Hypertension Neg Hx     History  Substance Use Topics  . Smoking status: Former Games developer  . Smokeless tobacco: Never Used  . Alcohol Use: 0.6 oz/week    1 Glasses of wine per week    OB History   Grav Para Term Preterm Abortions TAB SAB Ect Mult Living                  Review of Systems  All other systems reviewed and are negative.    Allergies  Alendronate sodium and Tramadol hcl  Home  Medications   Current Outpatient Rx  Name  Route  Sig  Dispense  Refill  . aspirin-acetaminophen-caffeine (EXCEDRIN MIGRAINE) 250-250-65 MG per tablet   Oral   Take 1 tablet by mouth as needed.           . midodrine (PROAMATINE) 5 MG tablet   Oral   Take 1 tablet (5 mg total) by mouth 3 (three) times daily with meals as needed (While awake for dizziness).   90 tablet   6   . Rivaroxaban (XARELTO) 20 MG TABS   Oral   Take 20 mg by mouth daily with supper.            BP 156/77  Pulse 62  Temp(Src) 96.7 F (35.9 C) (Oral)  Resp 16  Wt 125 lb (56.7 kg)  BMI 20.19 kg/m2  SpO2 97%  Physical Exam  Nursing note and vitals reviewed.  79 year old female, resting comfortably and in no acute distress. Vital signs are significant for hypertension with blood pressure 136/77. Oxygen saturation is 97%, which is normal. Head is normocephalic and atraumatic. PERRLA, EOMI although she resists looking past the midline to the left. Oropharynx is clear.  Neck is nontender and supple without adenopathy or JVD. There no carotid bruits. Back is nontender and there is no CVA tenderness. Lungs are clear without rales, wheezes, or rhonchi. Chest is nontender. Heart rhythm is irregularly irregular without murmur. Abdomen is soft, flat, nontender without masses or hepatosplenomegaly and peristalsis is normoactive. Extremities have no cyanosis or edema, full range of motion is present. Skin is warm and dry without rash. Neurologic: She is awake, alert, oriented to person place and time. Speech is slightly slow without any definite is dysarthria. She has no difficulty with naming. There is a moderate left central facial droop present with no other cranial nerve deficits identified. There is mild weakness of the left arm and left leg with strength 4/5 and very minimal left pronator drift. Gait was not tested but she seemed somewhat unsteady when being transferred from wheelchair to bed.  ED Course   Procedures (including critical care time)  Results for orders placed during the hospital encounter of 08/01/12  ETHANOL      Result Value Range   Alcohol, Ethyl (B) <11  0 - 11 mg/dL  PROTIME-INR      Result Value Range   Prothrombin Time 15.3 (*) 11.6 - 15.2 seconds   INR 1.23  0.00 - 1.49  APTT      Result Value Range   aPTT 35  24 - 37 seconds  CBC      Result Value Range   WBC 6.2  4.0 - 10.5 K/uL   RBC 4.86  3.87 - 5.11 MIL/uL   Hemoglobin 14.7  12.0 - 15.0 g/dL   HCT 16.1  09.6 - 04.5 %   MCV 90.1  78.0 - 100.0 fL   MCH 30.2  26.0 - 34.0 pg   MCHC 33.6  30.0 - 36.0 g/dL   RDW 40.9  81.1 - 91.4 %   Platelets 165  150 - 400 K/uL  DIFFERENTIAL      Result Value Range   Neutrophils Relative 69  43 - 77 %   Neutro Abs 4.2  1.7 - 7.7 K/uL   Lymphocytes Relative 20  12 - 46 %   Lymphs Abs 1.2  0.7 - 4.0 K/uL   Monocytes Relative 10  3 - 12 %   Monocytes Absolute 0.6  0.1 - 1.0 K/uL   Eosinophils Relative 1  0 - 5 %   Eosinophils Absolute 0.1  0.0 - 0.7 K/uL   Basophils Relative 1  0 - 1 %   Basophils Absolute 0.0  0.0 - 0.1 K/uL  COMPREHENSIVE METABOLIC PANEL      Result Value Range   Sodium 144  135 - 145 mEq/L   Potassium 4.1  3.5 - 5.1 mEq/L   Chloride 106  96 - 112 mEq/L   CO2 29  19 - 32 mEq/L   Glucose, Bld 154 (*) 70 - 99 mg/dL   BUN 16  6 - 23 mg/dL   Creatinine, Ser 7.82  0.50 - 1.10 mg/dL   Calcium 9.4  8.4 - 95.6 mg/dL   Total Protein 7.0  6.0 - 8.3 g/dL   Albumin 4.0  3.5 - 5.2 g/dL   AST 24  0 - 37 U/L   ALT 11  0 - 35 U/L   Alkaline Phosphatase 69  39 - 117 U/L   Total Bilirubin 0.5  0.3 - 1.2 mg/dL   GFR calc non Af Amer 50 (*) >90 mL/min   GFR calc Af Denyse Dago  58 (*) >90 mL/min  TROPONIN I      Result Value Range   Troponin I <0.30  <0.30 ng/mL  GLUCOSE, CAPILLARY      Result Value Range   Glucose-Capillary 141 (*) 70 - 99 mg/dL   Ct Head Wo Contrast  08/01/2012  *RADIOLOGY REPORT*  Clinical Data: Slurred speech.  Code stroke.  CT HEAD WITHOUT  CONTRAST  Technique:  Contiguous axial images were obtained from the base of the skull through the vertex without contrast.  Comparison: 05/10/2010.  Findings: No intracranial hemorrhage.  Prominent small vessel disease type changes without CT evidence of large acute infarct.  Calcified pineal gland slightly heterogeneous in appearance unchanged.  No other intracranial mass lesion detected on this unenhanced exam.  Global atrophy without hydrocephalus.  Vascular calcifications.  Mastoid air cells, middle ear cavities and visualized sinuses are clear.  IMPRESSION: No intracranial hemorrhage.  Prominent small vessel disease type changes without CT evidence of large acute infarct.  Please see above.  Code stroke called to Clydie Braun physician's assistant 08/01/2012 1:58 p.m.   Original Report Authenticated By: Lacy Duverney, M.D.      Date: 08/01/2012  Rate: 79  Rhythm: atrial fibrillation  QRS Axis: normal  Intervals: normal  ST/T Wave abnormalities: normal  Conduction Disutrbances:none  Narrative Interpretation: Atrial fibrillation, old anteroseptal myocardial infarction. When compared with ECG of 07/02/2010, age of fibrillation has replaced sinus rhythm.  Old EKG Reviewed: changes noted    1. Stroke   2. Atrial fibrillation    CRITICAL CARE Performed by: UJWJX,BJYNW Total critical care time: 55 minutes Critical care time was exclusive of separately billable procedures and treating other patients. Critical care was necessary to treat or prevent imminent or life-threatening deterioration. Critical care was time spent personally by me on the following activities: development of treatment plan with patient and/or surrogate as well as nursing, discussions with consultants, evaluation of patient's response to treatment, examination of patient, obtaining history from patient or surrogate, ordering and performing treatments and interventions, ordering and review of laboratory studies, ordering and review of  radiographic studies, pulse oximetry and re-evaluation of patient's condition.    MDM  Stroke with last known normal time at 7:30 AM which is approximately 6 hours ago. She is not a candidate for thrombolytic therapy because she is beyond of 3 hours past last known normal time, and she is on systemic anticoagulation. I estimate her NIH stroke scale at 3. CT has been ordered and she will be transferred to [facility]. Case is discussed with Dr. Roseanne Reno of neurology service who agrees that the combination of time from last known normal and patient being on systemic anticoagulation and relatively minor deficits will preclude any stroke intervention.   Additional family members have arrived and the person who saw the patient last known normal at 7:30 states that she actually was somewhat confused and she is not 100% sure that the patient was truly a baseline and then. Last definitely known normal would be last night.     Dione Booze, MD 08/01/12 [phone]

## 2012-08-01 NOTE — ED Provider Notes (Signed)
Pt transferred from [facility] after onset of stroke like symptoms outside of the tPA window but still within the 8 hr intervention window. Stroke Team is at bedside now. She is awake and alert.   Toni Kuenzel B. Bernette Mayers, MD 08/03/12 1610

## 2012-08-01 NOTE — ED Notes (Signed)
Report given to morgan brown rn charge nurse at [facility] [address] dr Rhunette Croft accepting dr Roseanne Reno will do neuro consult in ED and admit

## 2012-08-01 NOTE — Code Documentation (Signed)
79 yo in chronic a flutter and on xarelto.  She drove herself to the salon for a manicure and a facial which includes neck massage.  She has this done every two weeks.  Today she spoke with someone on the phone at 0730 who says she was her normal self. But the salon phoned her daughter at 47 to tell her they thought the pt was having trouble speaking. The pt drove herself home, though she does admit that she was slower getting into the house, and some delay in verbalizing her thoughts.  The dtr drove her to [facility] where they did a CT scan, and after consultation with Dr. Roseanne Reno, they called code stroke at 1344.  She arrived at [facility] at 1442. She was cleared at the bridge and taken to room CDU 6.  The pt is alert, chatty, pleasant.  NIHSS is 3, 1 for mild facial droop, 1 for LLE drift, and 1 for sensory. There is no indication for acute stroke treatment since LKW is 0730, she is on Xarelto, and sx are mild/improving. Handoff done with ED RN.

## 2012-08-01 NOTE — ED Notes (Signed)
Slurred speech x 1 hour. Slight droop on the left.

## 2012-08-02 DIAGNOSIS — R4789 Other speech disturbances: Secondary | ICD-10-CM

## 2012-08-02 DIAGNOSIS — I369 Nonrheumatic tricuspid valve disorder, unspecified: Secondary | ICD-10-CM

## 2012-08-02 DIAGNOSIS — G43909 Migraine, unspecified, not intractable, without status migrainosus: Secondary | ICD-10-CM

## 2012-08-02 DIAGNOSIS — M159 Polyosteoarthritis, unspecified: Secondary | ICD-10-CM

## 2012-08-02 DIAGNOSIS — I951 Orthostatic hypotension: Secondary | ICD-10-CM

## 2012-08-02 DIAGNOSIS — I6350 Cerebral infarction due to unspecified occlusion or stenosis of unspecified cerebral artery: Secondary | ICD-10-CM

## 2012-08-02 DIAGNOSIS — I639 Cerebral infarction, unspecified: Secondary | ICD-10-CM

## 2012-08-02 HISTORY — DX: Cerebral infarction, unspecified: I63.9

## 2012-08-02 LAB — GLUCOSE, CAPILLARY: Glucose-Capillary: 112 mg/dL — ABNORMAL HIGH (ref 70–99)

## 2012-08-02 LAB — HEMOGLOBIN A1C: Mean Plasma Glucose: 114 mg/dL (ref <117)

## 2012-08-02 LAB — LIPID PANEL
Cholesterol: 161 mg/dL (ref 0–200)
HDL: 65 mg/dL (ref >39)
Total CHOL/HDL Ratio: 2.5 RATIO
Triglycerides: 78 mg/dL (ref <150)
VLDL: 16 mg/dL (ref 0–40)

## 2012-08-02 MED ORDER — SODIUM CHLORIDE 0.9 % IV BOLUS (SEPSIS)
500.0000 mL | Freq: Once | INTRAVENOUS | Status: AC
Start: 2012-08-02 — End: 2012-08-02
  Administered 2012-08-02: 500 mL via INTRAVENOUS

## 2012-08-02 MED ORDER — RIVAROXABAN 15 MG PO TABS
15.0000 mg | ORAL_TABLET | Freq: Every day | ORAL | Status: DC
  Administered 2012-08-02: 15 mg via ORAL
  Filled 2012-08-02 (×2): qty 1

## 2012-08-02 NOTE — Progress Notes (Signed)
VASCULAR LAB PRELIMINARY  PRELIMINARY  PRELIMINARY  PRELIMINARY  Carotid duplex completed.    Preliminary report:  Bilateral:  No evidence of hemodynamically significant internal carotid artery stenosis.   Vertebral artery flow is antegrade.      Rosha Cocker, RVT 08/02/2012, 11:04 AM

## 2012-08-02 NOTE — Plan of Care (Signed)
Problem: Phase I Progression Outcomes Goal: Antithrombotic given by end of Day 2 Outcome: Completed/Met Date Met:  08/02/12 On xeralto

## 2012-08-02 NOTE — Evaluation (Signed)
Physical Therapy Evaluation Patient Details Name: [name] MRN: [id number] DOB: 20-Nov-1924 Today's Date: 08/02/2012 Time: 1350-1416 PT Time Calculation (min): 26 min  PT Assessment / Plan / Recommendation Clinical Impression  Pt is an 79 y.o. woman with an acute non hemorrhagic infarct extends from the lateral aspect of the right lenticular nucleus superiorly into the mid aspect of the right corona radiata.  Pt demonstrates deficits in functional mobility as outlined. Patient will benefit from continued skilled PT to address deficits and maximize functional mobility and safety in preparation for d/c home. Patient states that she has family available all but 3 hours a day. If family can provide assistance, rec HHPT and d/c home with family. Will continue to see acutely and progress activity as tolerated.    PT Assessment  Patient needs continued PT services    Follow Up Recommendations  Home health PT;Supervision/Assistance - 24 hour;Supervision for mobility/OOB    Does the patient have the potential to tolerate intense rehabilitation      Barriers to Discharge None (3 hour window when family is NOT available)      Equipment Recommendations  Rolling walker with 5" wheels    Recommendations for Other Services OT consult   Frequency Min 3X/week    Precautions / Restrictions Precautions Precautions: Fall   Pertinent Vitals/Pain No pain at this time      Mobility  Bed Mobility Bed Mobility: Supine to Sit;Sitting - Scoot to Edge of Bed;Sit to Supine Supine to Sit: 5: Supervision Sitting - Scoot to Edge of Bed: 5: Supervision Sit to Supine: 5: Supervision Details for Bed Mobility Assistance: VCs for controlled movement Transfers Transfers: Sit to Stand;Stand to Sit Sit to Stand: 4: Min guard;From bed Stand to Sit: 4: Min guard;To bed Details for Transfer Assistance: VCs for hand placement and positioning, pt with some impulsivity and initial instability with  standing. Ambulation/Gait Ambulation/Gait Assistance: 4: Min guard;4: Min Environmental consultant (Feet): 200 Feet Assistive device: Rolling walker Ambulation/Gait Assistance Details: Pt with some instability with ambulation; occassional LOB requiring assist with lateral lean to the left.  Gait Pattern: Decreased stride length;Trunk flexed;Narrow base of support Gait velocity: WFL for community ambulation General Gait Details: instability noted Stairs: No    Exercises General Exercises - Lower Extremity Ankle Circles/Pumps: AROM;Both;10 reps Long Arc Quad: AROM;Both;10 reps Straight Leg Raises: AROM;Both;10 reps Hip Flexion/Marching: AROM;Both;10 reps   PT Diagnosis: Difficulty walking;Abnormality of gait;Generalized weakness  PT Problem List: Decreased strength;Decreased range of motion;Decreased activity tolerance;Decreased balance;Decreased mobility;Decreased knowledge of use of DME PT Treatment Interventions: DME instruction;Gait training;Stair training;Functional mobility training;Therapeutic activities;Therapeutic exercise;Balance training;Patient/family education   PT Goals Acute Rehab PT Goals PT Goal Formulation: With patient Time For Goal Achievement: 08/16/12 Potential to Achieve Goals: Good Pt will go Sit to Stand: with modified independence PT Goal: Sit to Stand - Progress: Goal set today Pt will Ambulate: >150 feet;with modified independence PT Goal: Ambulate - Progress: Goal set today Pt will Go Up / Down Stairs: Flight;with min assist PT Goal: Up/Down Stairs - Progress: Goal set today  Visit Information  Last PT Received On: 08/02/12 Assistance Needed: +1    Subjective Data  Subjective: I feel like it's all my mouth Patient Stated Goal: to go home with daughters   Prior Functioning  Home Living Lives With: Family Available Help at Discharge: Family;Available 24 hours/day (only about 3 hrs that pt is home alone) Type of Home: House Home Access: Stairs  to enter [employer] of Steps: 1  Entrance Stairs-Rails: None Home Layout: Two level;Bed/bath upstairs Alternate Level Stairs-Number of Steps: 15 Alternate Level Stairs-Rails: Right Bathroom Shower/Tub: Tub/shower unit;Walk-in shower Bathroom Toilet: Standard Home Adaptive Equipment: None Prior Function Level of Independence: Independent Able to Take Stairs?: Yes Driving: Yes Vocation: Retired Musician:  (slurred speech) Dominant Hand: Right    Cognition  Cognition Arousal/Alertness: Awake/alert Behavior During Therapy: WFL for tasks assessed/performed Overall Cognitive Status: Within Functional Limits for tasks assessed    Extremity/Trunk Assessment Right Upper Extremity Assessment RUE ROM/Strength/Tone: WFL for tasks assessed RUE Coordination: WFL - gross/fine motor Left Upper Extremity Assessment LUE ROM/Strength/Tone: Deficits LUE ROM/Strength/Tone Deficits: noted weakness and limited range in overhead motions LUE Coordination: WFL - gross/fine motor Right Lower Extremity Assessment RLE ROM/Strength/Tone: WFL for tasks assessed Left Lower Extremity Assessment LLE ROM/Strength/Tone: WFL for tasks assessed Trunk Assessment Trunk Assessment: Normal   Balance Balance Balance Assessed: Yes High Level Balance High Level Balance Activites: Side stepping;Backward walking;Direction changes;Turns;Head turns High Level Balance Comments: some instability with balance activities requiring tactile guard and minimal assist at times  End of Session PT - End of Session Equipment Utilized During Treatment: Gait belt Activity Tolerance: Patient tolerated treatment well Patient left: in bed;with call bell/phone within reach;with bed alarm set Nurse Communication: Mobility status;Other (comment) (patient IV site bleeding to be checked)  GP     Fabio Asa 08/02/2012, 2:58 PM Charlotte Crumb, PT DPT  [phone]

## 2012-08-02 NOTE — Progress Notes (Signed)
Stroke Team Progress Note  HISTORY [name] is an 79 y.o. female who LKW by telephone around 0730 on 08/01/2012. She went to the Salon (included neck massage) where they felt like she was having difficulty speaking. That person did call the daughter to tell her about her mom. The patient also says that she felt that she had delay in her speech and was slower in ambulation. Once she looked in the mirror, she also noticed her facial droop. Daughter drove her to [facility] . Patient is on xarelto for afib/flutter (Dr. Jacinto Halim). CT was non-acute. Patient was called as code stroke; however, sx resolving upon transfer to [facility]. Patient has migraines of note. No complaints of neck pain or dizziness. Patient was not a TPA candidate secondary to delay in arrival. She was admitted for further evaluation and treatment.  SUBJECTIVE Her daughters are at the bedside.  Overall she feels her condition is gradually improving. Her daughter is a Charity fundraiser at [employer].  OBJECTIVE Most recent Vital Signs: Filed Vitals:   08/02/12 0245 08/02/12 0330 08/02/12 0520 08/02/12 0600  BP: 135/86 144/82 148/86 145/64  Pulse: 70 77 74 73  Temp: 97.6 F (36.4 C) 97.5 F (36.4 C) 98 F (36.7 C) 97.8 F (36.6 C)  TempSrc: Oral Oral Oral Oral  Resp: 16 16 18 16   Height:      Weight:      SpO2: 97% 97% 98% 96%   CBG (last 3)   Recent Labs  08/01/12 1402 08/01/12 1748 08/02/12 0751  GLUCAP 141* 72 111*   IV Fluid Intake:   . sodium chloride 50 mL/hr at 08/01/12 1930   MEDICATIONS  . loratadine  10 mg Oral Daily  . Rivaroxaban  20 mg Oral Q supper   PRN:  acetaminophen, aspirin-acetaminophen-caffeine, midodrine, MUSCLE RUB  Diet:  Cardiac thin liquids Activity:   Bathroom privileges with assistance DVT Prophylaxis:  xarelto  CLINICALLY SIGNIFICANT STUDIES Basic Metabolic Panel:  Recent Labs Lab 08/01/12 1320  NA 144  K 4.1  CL 106  CO2 29  GLUCOSE 154*  BUN 16  CREATININE 1.00   CALCIUM 9.4   Liver Function Tests:  Recent Labs Lab 08/01/12 1320  AST 24  ALT 11  ALKPHOS 69  BILITOT 0.5  PROT 7.0  ALBUMIN 4.0   CBC:  Recent Labs Lab 08/01/12 1320  WBC 6.2  NEUTROABS 4.2  HGB 14.7  HCT 43.8  MCV 90.1  PLT 165   Coagulation:  Recent Labs Lab 08/01/12 1320  LABPROT 15.3*  INR 1.23   Cardiac Enzymes:  Recent Labs Lab 08/01/12 1320  TROPONINI <0.30   Urinalysis:  Recent Labs Lab 08/01/12 1607  COLORURINE YELLOW  LABSPEC 1.012  PHURINE 6.5  GLUCOSEU NEGATIVE  HGBUR NEGATIVE  BILIRUBINUR NEGATIVE  KETONESUR NEGATIVE  PROTEINUR NEGATIVE  UROBILINOGEN 0.2  NITRITE NEGATIVE  LEUKOCYTESUR NEGATIVE   Lipid Panel    Component Value Date/Time   CHOL 161 08/02/2012 0500   TRIG 78 08/02/2012 0500   HDL 65 08/02/2012 0500   CHOLHDL 2.5 08/02/2012 0500   VLDL 16 08/02/2012 0500   LDLCALC 80 08/02/2012 0500   HgbA1C  Lab Results  Component Value Date   HGBA1C 5.6 08/01/2012   Urine Drug Screen:   No results found for this basename: labopia, cocainscrnur, labbenz, amphetmu, thcu, labbarb    Alcohol Level:  Recent Labs Lab 08/01/12 1320  ETH <11   CT of the brain  08/01/2012  No  intracranial hemorrhage.  Prominent small vessel disease type changes without CT evidence of large acute infarct.    MRI of the brain  08/02/2012   Acute non hemorrhagic infarct extends from the lateral aspect of the right lenticular nucleus superiorly into the mid aspect of the right corona radiata.    MRA of the brain  08/02/2012   Intracranial atherosclerotic type changes as detailed above.  Small bulge superior margin P1 segment left posterior cerebral artery may represent a tiny (1.1 mm) aneurysm versus result of atherosclerotic type changes.   2D Echocardiogram    Carotid Doppler    CXR    EKG  atrial flutter, rate 79.   Therapy Recommendations   Physical Exam   Elderly Caucasian lady currently not in distress.Awake alert. Afebrile. Head is nontraumatic.  Neck is supple without bruit. Hearing is normal. Cardiac exam no murmur or gallop. Lungs are clear to auscultation. Distal pulses are well felt. Neurological Exam : Awake alert oriented x 3 normal speech and language. Mild left lower face asymmetry. Tongue midline. No drift. Mild diminished fine finger movements on left. Orbits right over left upper extremity. Mild left grip weak.. Normal sensation . Normal coordination. ASSESSMENT [name] is a 79 y.o. female presenting with difficulty speaking.  Imaging confirms a right  lenticular nucleus infarct. Infarct felt to be thrombotic secondary to small vessel disease. Some worsening of neuro sx during the night, resolved with lying flat and NS bolus. Stroke not likely related to atrial flutter.  On xarelto prior to admission. Now on xarelto for secondary stroke prevention. Patient with resultant dysarthria. Work up underway.  atrial flutter, on xarelto PTA (changed from pradaxa to decrease frequency of pill intake) Hypertension Hx migraine orthostatic hypotension LDL 80 HgnA1c 5.6  Hospital day # 1  TREATMENT/PLAN  Continue xarelto for secondary stroke prevention. There is no indication to change anticoagulants or add aspirin  for stroke prevention  F/u 2D, carotid doppler  OOB, therapy evals  Annie Main, MSN, RN, ANVP-BC, ANP-BC, GNP-BC [facility] Pager: [phone] 08/02/2012 9:37 AM  I have personally obtained a history, examined the patient, evaluated imaging results, and formulated the assessment and plan of care. I agree with the above. Delia Heady, MD

## 2012-08-02 NOTE — Progress Notes (Signed)
  Echocardiogram 2D Echocardiogram has been performed.  Sydni Elizarraraz FRANCES 08/02/2012, 11:40 AM

## 2012-08-02 NOTE — Progress Notes (Signed)
Around 0330 upon ambulation to the restroom pt was unable to get off the toilet. With the assistance of 2 ppl the pt was able to get back in the bed. Once in the bed the pt stated that she had a lot of saliva under her tongue that she didn't have before as well as a rt sided h/a & tingling in her rt leg. A neuro assessment was done & noted new symptoms. The neurologist on call  was made aware. New orders to administer 500 cc of NS and place pt on bedrest until symptoms subsided. Will continue to monitor the pt. Sanda Linger

## 2012-08-02 NOTE — Progress Notes (Signed)
Patient ID: [name]  female  ZOX:[id number]    DOB: 1924/08/15    DOA: 08/01/2012  PCP: Letitia Libra, Ala Dach, MD  Assessment/Plan: Principal Problem:  Acute CVA (cerebral infarction) - CT of the brain showed no intracranial hemorrhage, no CT evidence of large acute infarct - MRI of the brain showed Acute non hemorrhagic infarct extends from the lateral aspect of the right lenticular nucleus superiorly into the mid aspect of the right corona radiata.  MRA of the brain showed  Intracranial atherosclerotic type changes, Small bulge superior margin P1 segment left posterior cerebral artery may represent a tiny (1.1 mm) aneurysm versus result of atherosclerotic type changes.  -2-D echo results pending  - Carotid Doppler showed no evidence of hemodynamically significant internal carotid artery stenosis - Continue xarelto for secondary stroke prevention  - PT, OT, speech evaluation - LDL 80   Active Problems:    GEN OSTEOARTHROSIS INVOLVING MULTIPLE SITES - Pain controlled    Atrial flutter - Currently rate controlled, continue xarelto    Orthostatic hypotension: - BP improving, on midodrine   DVT Prophylaxis:SCDs  Code Status:Full CODE STATUS  Disposition:Hopefully tomorrow, follow PTOT evaluation    Subjective: Feels is somewhat better today  Objective: Weight change:   Intake/Output Summary (Last 24 hours) at 08/02/12 1257 Last data filed at 08/02/12 0800  Gross per 24 hour  Intake    360 ml  Output    250 ml  Net    110 ml   Blood pressure 145/64, pulse 73, temperature 97.8 F (36.6 C), temperature source Oral, resp. rate 16, height 5\' 6"  (1.676 m), weight 55.5 kg (122 lb 5.7 oz), SpO2 96.00%.  Physical Exam: General: Alert and awake, oriented x3, not in any acute distress. CVS: S1-S2 clear, no murmur rubs or gallops Chest: clear to auscultation bilaterally, no wheezing, rales or rhonchi Abdomen: soft nontender, nondistended, normal bowel sounds Extremities:  no cyanosis, clubbing or edema noted bilaterally Neuro: Cranial nerves II-XII intact, no focal neurological deficits, slight weakness on the left side    Lab Results: Basic Metabolic Panel:  Recent Labs Lab 08/01/12 1320  NA 144  K 4.1  CL 106  CO2 29  GLUCOSE 154*  BUN 16  CREATININE 1.00  CALCIUM 9.4   Liver Function Tests:  Recent Labs Lab 08/01/12 1320  AST 24  ALT 11  ALKPHOS 69  BILITOT 0.5  PROT 7.0  ALBUMIN 4.0   No results found for this basename: LIPASE, AMYLASE,  in the last 168 hours No results found for this basename: AMMONIA,  in the last 168 hours CBC:  Recent Labs Lab 08/01/12 1320  WBC 6.2  NEUTROABS 4.2  HGB 14.7  HCT 43.8  MCV 90.1  PLT 165   Cardiac Enzymes:  Recent Labs Lab 08/01/12 1320  TROPONINI <0.30   BNP: No components found with this basename: POCBNP,  CBG:  Recent Labs Lab 08/01/12 1402 08/01/12 1748 08/02/12 0751 08/02/12 1228  GLUCAP 141* 72 111* 87     Micro Results: No results found for this or any previous visit (from the past 240 hour(s)).  Studies/Results: Ct Head Wo Contrast  08/01/2012  *RADIOLOGY REPORT*  Clinical Data: Slurred speech.  Code stroke.  CT HEAD WITHOUT CONTRAST  Technique:  Contiguous axial images were obtained from the base of the skull through the vertex without contrast.  Comparison: 05/10/2010.  Findings: No intracranial hemorrhage.  Prominent small vessel disease type changes without CT evidence of large acute infarct.  Calcified pineal gland slightly heterogeneous in appearance unchanged.  No other intracranial mass lesion detected on this unenhanced exam.  Global atrophy without hydrocephalus.  Vascular calcifications.  Mastoid air cells, middle ear cavities and visualized sinuses are clear.  IMPRESSION: No intracranial hemorrhage.  Prominent small vessel disease type changes without CT evidence of large acute infarct.  Please see above.  Code stroke called to Clydie Braun physician's assistant  08/01/2012 1:58 p.m.   Original Report Authenticated By: Lacy Duverney, M.D.    Mr [facility] Wo Contrast  08/02/2012  *RADIOLOGY REPORT*  Clinical Data:  Difficulty speaking.  Minimal left-sided facial droop and left-sided weakness.  MRI BRAIN WITHOUT CONTRAST MRA HEAD WITHOUT CONTRAST  Technique: Multiplanar, multiecho pulse sequences of the brain and surrounding structures were obtained according to standard protocol without intravenous contrast.  Angiographic images of the head were obtained using MRA technique without contrast.  Comparison: 08/01/2012 head CT.  MRI HEAD  Findings:   Acute non hemorrhagic infarct extends from the lateral aspect of the right lenticular nucleus superiorly into the mid aspect of the right corona radiata.  No intracranial hemorrhage.  Moderate small vessel disease type changes.  Global atrophy without hydrocephalus.  No intracranial mass lesion detected on this unenhanced exam. Cervical spondylotic changes C3-4 with slight cord flattening.  Major intracranial vascular structures are patent.  IMPRESSION:  Acute non hemorrhagic infarct extends from the lateral aspect of the right lenticular nucleus superiorly into the mid aspect of the right corona radiata.  Please see above.  MRA HEAD  Findings: Anterior circulation without medium or large size vessel significant stenosis or occlusion.  Mild narrowing distal A1 segment of the right anterior cerebral artery.  Moderate to marked focal stenosis A2 segment left anterior cerebral artery.  Middle cerebral artery mild to moderate branch vessel irregularity bilaterally.  Codominant vertebral arteries without high-grade stenosis.  Nonvisualization right PICA and left AICA.  Mild irregularity superior cerebellar artery bilaterally.  Moderate narrowing involving portions of the distal posterior cerebral artery branches bilaterally.  Small bulge superior margin P1 segment left posterior cerebral artery may represent a tiny (1.1 mm) aneurysm  versus result of atherosclerotic type changes.  IMPRESSION: Intracranial atherosclerotic type changes as detailed above.  Small bulge superior margin P1 segment left posterior cerebral artery may represent a tiny (1.1 mm) aneurysm versus result of atherosclerotic type changes.  This has been made a PRA call report utilizing dashboard call feature.   Original Report Authenticated By: Lacy Duverney, M.D.    Mr Brain Wo Contrast  08/02/2012  *RADIOLOGY REPORT*  Clinical Data:  Difficulty speaking.  Minimal left-sided facial droop and left-sided weakness.  MRI BRAIN WITHOUT CONTRAST MRA HEAD WITHOUT CONTRAST  Technique: Multiplanar, multiecho pulse sequences of the brain and surrounding structures were obtained according to standard protocol without intravenous contrast.  Angiographic images of the head were obtained using MRA technique without contrast.  Comparison: 08/01/2012 head CT.  MRI HEAD  Findings:   Acute non hemorrhagic infarct extends from the lateral aspect of the right lenticular nucleus superiorly into the mid aspect of the right corona radiata.  No intracranial hemorrhage.  Moderate small vessel disease type changes.  Global atrophy without hydrocephalus.  No intracranial mass lesion detected on this unenhanced exam. Cervical spondylotic changes C3-4 with slight cord flattening.  Major intracranial vascular structures are patent.  IMPRESSION:  Acute non hemorrhagic infarct extends from the lateral aspect of the right lenticular nucleus superiorly into the mid aspect of the right corona radiata.  Please see above.  MRA HEAD  Findings: Anterior circulation without medium or large size vessel significant stenosis or occlusion.  Mild narrowing distal A1 segment of the right anterior cerebral artery.  Moderate to marked focal stenosis A2 segment left anterior cerebral artery.  Middle cerebral artery mild to moderate branch vessel irregularity bilaterally.  Codominant vertebral arteries without high-grade  stenosis.  Nonvisualization right PICA and left AICA.  Mild irregularity superior cerebellar artery bilaterally.  Moderate narrowing involving portions of the distal posterior cerebral artery branches bilaterally.  Small bulge superior margin P1 segment left posterior cerebral artery may represent a tiny (1.1 mm) aneurysm versus result of atherosclerotic type changes.  IMPRESSION: Intracranial atherosclerotic type changes as detailed above.  Small bulge superior margin P1 segment left posterior cerebral artery may represent a tiny (1.1 mm) aneurysm versus result of atherosclerotic type changes.  This has been made a PRA call report utilizing dashboard call feature.   Original Report Authenticated By: Lacy Duverney, M.D.     Medications: Scheduled Meds: . loratadine  10 mg Oral Daily  . rivaroxaban  15 mg Oral Q supper      LOS: 1 day   Nicholous Girgenti M.D. [facility] 08/02/2012, 12:57 PM Pager: [phone]  If 7PM-7AM, please contact night-coverage [email or web address] Password [email or web address]

## 2012-08-03 DIAGNOSIS — H269 Unspecified cataract: Secondary | ICD-10-CM

## 2012-08-03 DIAGNOSIS — I6350 Cerebral infarction due to unspecified occlusion or stenosis of unspecified cerebral artery: Secondary | ICD-10-CM

## 2012-08-03 DIAGNOSIS — I4892 Unspecified atrial flutter: Secondary | ICD-10-CM

## 2012-08-03 DIAGNOSIS — Z860100 Personal history of colon polyps, unspecified: Secondary | ICD-10-CM

## 2012-08-03 DIAGNOSIS — R112 Nausea with vomiting, unspecified: Secondary | ICD-10-CM

## 2012-08-03 DIAGNOSIS — R4789 Other speech disturbances: Secondary | ICD-10-CM

## 2012-08-03 DIAGNOSIS — G43909 Migraine, unspecified, not intractable, without status migrainosus: Secondary | ICD-10-CM

## 2012-08-03 LAB — GLUCOSE, CAPILLARY: Glucose-Capillary: 94 mg/dL (ref 70–99)

## 2012-08-03 MED ORDER — AMLODIPINE BESYLATE 2.5 MG PO TABS: 2.5000 mg | ORAL_TABLET | Freq: Every day | ORAL | Status: DC

## 2012-08-03 MED ORDER — IRBESARTAN 150 MG PO TABS: 150.0000 mg | ORAL_TABLET | Freq: Every day | ORAL | Status: DC

## 2012-08-03 MED ORDER — MUSCLE RUB 10-15 % EX CREA: 1.0000 "application " | TOPICAL_CREAM | CUTANEOUS | Status: DC | PRN

## 2012-08-03 MED ORDER — IRBESARTAN 150 MG PO TABS
150.0000 mg | ORAL_TABLET | Freq: Every day | ORAL | Status: DC
  Filled 2012-08-03: qty 1

## 2012-08-03 MED ORDER — AMLODIPINE BESYLATE 2.5 MG PO TABS
2.5000 mg | ORAL_TABLET | Freq: Every day | ORAL | Status: DC
  Filled 2012-08-03: qty 1

## 2012-08-03 MED ORDER — AMLODIPINE BESYLATE 5 MG PO TABS: 5.0000 mg | ORAL_TABLET | Freq: Every day | ORAL | Status: DC

## 2012-08-03 MED ORDER — AMLODIPINE BESYLATE 5 MG PO TABS
5.0000 mg | ORAL_TABLET | Freq: Every day | ORAL | Status: DC
  Filled 2012-08-03: qty 1

## 2012-08-03 MED ORDER — RIVAROXABAN 15 MG PO TABS: 15.0000 mg | ORAL_TABLET | Freq: Every day | ORAL | Status: DC

## 2012-08-03 MED ORDER — UNABLE TO FIND: Status: DC

## 2012-08-03 NOTE — Care Management Note (Addendum)
  Page 2 of 2   08/03/2012     10:29:33 AM   CARE MANAGEMENT NOTE 08/03/2012  Patient:  [name], [name]   Account Number:  [id number]  Date Initiated:  08/03/2012  Documentation initiated by:  GRAVES-BIGELOW,Giara Mcgaughey  Subjective/Objective Assessment:   Pt admitted with acute CVA. Pt is from home with daughters. Plan is to return home today with [facility] services.     Action/Plan:   Choice offered to pt and daughters and they chose [facility]. CM did make referral for serivces. SOC to begin within 24-48 hrs of d/c. CM did call [facility] for DME [facility]. Pt may have to get from Store. CM did make a call to DME Provider to see if any @ the actual [facility] store. None available in the hospital. Will f/u.    Anticipated DC Date:  08/03/2012   Anticipated DC Plan:  HOME W HOME HEALTH SERVICES      DC Planning Services  CM consult      PAC Choice  DURABLE MEDICAL EQUIPMENT  HOME HEALTH   Choice offered to / List presented to:  C-4 Adult Children   DME arranged  [facility]      DME agency  [facility]     [facility] arranged  HH-1 RN  HH-10 DISEASE MANAGEMENT  HH-2 PT  HH-3 OT  HH-4 NURSE'S AIDE      HH agency  [facility]   Status of service:  Completed, signed off Medicare Important Message given?   (If response is "NO", the following Medicare IM given date fields will be blank) Date Medicare IM given:   Date Additional Medicare IM given:    Discharge Disposition:  HOME W HOME HEALTH SERVICES  Per UR Regulation:  Reviewed for med. necessity/level of care/duration of stay  If discussed at Long Length of Stay Meetings, dates discussed:    Comments:

## 2012-08-03 NOTE — Progress Notes (Signed)
Stroke Team Progress Note  HISTORY [name] is an 79 y.o. female who LKW by telephone around 0730 on 08/01/2012. She went to the [facility] (included neck massage) where they felt like she was having difficulty speaking. That person did call the daughter to tell her about her mom. The patient also says that she felt that she had delay in her speech and was slower in ambulation. Once she looked in the mirror, she also noticed her facial droop. Daughter drove her to [facility] . Patient is on xarelto for afib/flutter (Dr. Jacinto Halim). CT was non-acute. Patient was called as code stroke; however, sx resolving upon transfer to [facility]. Patient has migraines of note. No complaints of neck pain or dizziness. Patient was not a TPA candidate secondary to delay in arrival. She was admitted for further evaluation and treatment.  SUBJECTIVE Family with patient. Patient lying in bed ready to go home. No obvious deficits except fine motor skills.  OBJECTIVE Most recent Vital Signs: Filed Vitals:   08/02/12 0245 08/02/12 0330 08/02/12 0520 08/02/12 0600  BP: 135/86 144/82 148/86 145/64  Pulse: 70 77 74 73  Temp: 97.6 F (36.4 C) 97.5 F (36.4 C) 98 F (36.7 C) 97.8 F (36.6 C)  TempSrc: Oral Oral Oral Oral  Resp: 16 16 18 16   Height:      Weight:      SpO2: 97% 97% 98% 96%   CBG (last 3)   Recent Labs  08/01/12 1402 08/01/12 1748 08/02/12 0751  GLUCAP 141* 72 111*   IV Fluid Intake:   . sodium chloride 50 mL/hr at 08/01/12 1930   MEDICATIONS  . loratadine  10 mg Oral Daily  . Rivaroxaban  20 mg Oral Q supper   PRN:  acetaminophen, aspirin-acetaminophen-caffeine, midodrine, MUSCLE RUB  Diet:  Cardiac thin liquids Activity:   Bathroom privileges with assistance DVT Prophylaxis:  xarelto  CLINICALLY SIGNIFICANT STUDIES Basic Metabolic Panel:  Recent Labs Lab 08/01/12 1320  NA 144  K 4.1  CL 106  CO2 29  GLUCOSE 154*  BUN 16  CREATININE 1.00  CALCIUM 9.4   Liver Function Tests:   Recent Labs Lab 08/01/12 1320  AST 24  ALT 11  ALKPHOS 69  BILITOT 0.5  PROT 7.0  ALBUMIN 4.0   CBC:  Recent Labs Lab 08/01/12 1320  WBC 6.2  NEUTROABS 4.2  HGB 14.7  HCT 43.8  MCV 90.1  PLT 165   Coagulation:  Recent Labs Lab 08/01/12 1320  LABPROT 15.3*  INR 1.23   Cardiac Enzymes:  Recent Labs Lab 08/01/12 1320  TROPONINI <0.30   Urinalysis:  Recent Labs Lab 08/01/12 1607  COLORURINE YELLOW  LABSPEC 1.012  PHURINE 6.5  GLUCOSEU NEGATIVE  HGBUR NEGATIVE  BILIRUBINUR NEGATIVE  KETONESUR NEGATIVE  PROTEINUR NEGATIVE  UROBILINOGEN 0.2  NITRITE NEGATIVE  LEUKOCYTESUR NEGATIVE   Lipid Panel    Component Value Date/Time   CHOL 161 08/02/2012 0500   TRIG 78 08/02/2012 0500   HDL 65 08/02/2012 0500   CHOLHDL 2.5 08/02/2012 0500   VLDL 16 08/02/2012 0500   LDLCALC 80 08/02/2012 0500   HgbA1C  Lab Results  Component Value Date   HGBA1C 5.6 08/01/2012   Urine Drug Screen:   No results found for this basename: labopia, cocainscrnur, labbenz, amphetmu, thcu, labbarb    Alcohol Level:  Recent Labs Lab 08/01/12 1320  ETH <11   CT of the brain  08/01/2012  No intracranial hemorrhage.  Prominent small vessel disease  type changes without CT evidence of large acute infarct.    MRI of the brain  08/02/2012   Acute non hemorrhagic infarct extends from the lateral aspect of the right lenticular nucleus superiorly into the mid aspect of the right corona radiata.    MRA of the brain  08/02/2012   Intracranial atherosclerotic type changes as detailed above.  Small bulge superior margin P1 segment left posterior cerebral artery may represent a tiny (1.1 mm) aneurysm versus result of atherosclerotic type changes.   2D Echocardiogram  60% . Normal wall motion,   Carotid Doppler  No significant extracranial carotid artery stenosis demonstrated. Vertebrals are patent with antegrade flow.  CXR    EKG  atrial flutter, rate 79.   Therapy Recommendations HHPT  Physical Exam    Elderly Caucasian lady currently not in distress.Awake alert. Afebrile. Head is nontraumatic. Neck is supple without bruit. Hearing is normal. Cardiac exam no murmur or gallop. Lungs are clear to auscultation. Distal pulses are well felt. Neurological Exam : Awake alert oriented x 3 normal speech and language. Mild left lower face asymmetry. Tongue midline. No drift. Mild diminished fine finger movements on left. Orbits right over left upper extremity. Mild left grip weak.. Normal sensation . Normal coordination.   ASSESSMENT [name] is a 79 y.o. female presenting with difficulty speaking.  Imaging confirms a right  lenticular nucleus infarct. Infarct felt to be thrombotic secondary to small vessel disease. Some worsening of neuro sx during the night, resolved with lying flat and NS bolus. Stroke not likely related to atrial flutter.  On xarelto prior to admission. Now on xarelto for secondary stroke prevention. Patient with resultant dysarthria. Work up underway.  atrial flutter, on xarelto PTA (changed from pradaxa to decrease frequency of pill intake) Hypertension Hx migraine orthostatic hypotension LDL 80 HgnA1c 5.6  Hospital day # 1  TREATMENT/PLAN  Continue xarelto for secondary stroke prevention. There is no indication to change anticoagulants or add aspirin  for stroke prevention  For discharge today. Have patient follow-up with Dr. Pearlean Brownie in 2 months.  Gwendolyn Lima. Manson Passey, [facility], MBA, MHA [facility] Pager: [phone] 08/03/2012 3:42 PM  I have personally obtained a history, examined the patient, evaluated imaging results, and formulated the assessment and plan of care. I agree with the above.  Delia Heady, MD

## 2012-08-03 NOTE — Clinical Documentation Improvement (Signed)
GENERIC DOCUMENTATION CLARIFICATION QUERY  THIS DOCUMENT IS NOT A PERMANENT PART OF THE MEDICAL RECORD  TO RESPOND TO THE THIS QUERY, FOLLOW THE INSTRUCTIONS BELOW:  1. If needed, update documentation for the patient's encounter via the notes activity.  2. Access this query again and click edit on the In [employer].  3. After updating, or not, click F2 to complete all highlighted (required) fields concerning your review. Select "additional documentation in the medical record" OR "no additional documentation provided".  4. Click Sign note button.  5. The deficiency will fall out of your In Basket *Please let us know if you are not able to complete this workflow by phone or e-mail (listed below).  Please update your documentation within the medical record to reflect your response to this query.                                                                                        08/03/12   Dear Dr. Isidoro Donning,   In a better effort to capture your patient's severity of illness, reflect appropriate length of stay and utilization of resources, a review of the patient medical record has revealed the following indicators.   PLEASE CLARIFY "LEFT SIDE WEAKNESS" IN NOTES AND DC SUMMARY IF APPROPRIATE. THANK YOU.   Possible Clinical Conditions? - Left hemiparesis  - Other condition   Supporting Information: - Risk Factors: Acute CVA - Signs & Symptoms: "slight weakness on the left side"  You may use possible, probable, or suspect with inpatient documentation. possible, probable, suspected diagnoses MUST be documented at the time of discharge  Reviewed: see DC summary, patient has acute CVA Thank You,  Beverley Fiedler RN BSN Clinical Documentation Specialist: Tele Contact: [phone]  Health Information Management [place]

## 2012-08-03 NOTE — Discharge Summary (Signed)
Physician Discharge Summary  Patient ID: [name] MRN: [id number] DOB/AGE: 05-05-1924 79 y.o.  Admit date: 08/01/2012 Discharge date: 08/03/2012  Primary Care Physician:  Letitia Libra, Ala Dach, MD  Discharge Diagnoses:   . Acute CVA (cerebral infarction) . MIGRAINE HEADACHE . GEN OSTEOARTHROSIS INVOLVING MULTIPLE SITES . Atrial flutter . Orthostatic hypotension . Slurred speech . CVA (cerebral infarction)  Consults:  Neurology, stroke service Dr Pearlean Brownie   Recommendations for Outpatient Follow-up:  1) patient is started on amlodipine, adjust medications according to BP readings. Patient has history of orthostatic hypotension, may stop amlodipine if SBP is lower than 100.  Patient was advised to take 1 pill (2.5mg ) if SBP above 150, 2 pills (5mg ) if SBP above 175 or DBP above100. 2 xarelto dose has been decreased to 15 mg daily according to renal function    Discharge Medications:   Medication List    TAKE these medications       amLODipine 5 MG tablet  Commonly known as:  NORVASC  Take 1 tablet (5 mg total) by mouth daily.     aspirin-acetaminophen-caffeine 250-250-65 MG per tablet  Commonly known as:  EXCEDRIN MIGRAINE  Take 2 tablets by mouth daily as needed for pain (for migraines).     Calcium-Vitamin D-Vitamin K 500-100-40 MG-UNT-MCG Chew  Chew 1 tablet by mouth 3 (three) times daily.     loratadine 10 MG tablet  Commonly known as:  CLARITIN  Take 10 mg by mouth daily as needed for allergies.     midodrine 5 MG tablet  Commonly known as:  PROAMATINE  Take 1 tablet (5 mg total) by mouth 3 (three) times daily with meals as needed (While awake for dizziness).     multivitamin with minerals Tabs  Take 1 tablet by mouth daily.     MUSCLE RUB 10-15 % Crea  Apply 1 application topically as needed (apply to back).     Rivaroxaban 15 MG Tabs tablet  Commonly known as:  XARELTO  Take 1 tablet (15 mg total) by mouth daily with supper.         Brief H and  P: For complete details please refer to admission H and P, but in briefJean S Gregory is a 79 y.o. female with history of chronic atrial flutter on Xeralto, with history of prior syncope and severe orthostatic hypotension. She was feeling in her usual state of health on the morning of admission and went to the beauty salon for her pedicure. She did not wear her TED hose for orthostatic hypotension because she was going to have a foot massage. While at the salon between 10 - 11am, she developed difficulty speaking and then noticed she was having difficulty walking. She had never had symptoms like this before. She was hospitalized in March 2014 with Orthostatic hypotension causing syncope and was started on PRN midodrine. Per her family her SBP is high when lying down but low when standing. This am she had not taken any midodrine today. Patient was brought to the ED as a code stroke. She was outside the window and on Xeralto - so no TPA was given. In the ED her symptoms have virtually resolved. She is noted to have minimal trouble forming words, minimal left sided facial droop and minimal left sided weakness.      Hospital Course:   Acute CVA (cerebral infarction)  - CT of the brain showed no intracranial hemorrhage, no CT evidence of large acute infarct  - MRI of the  brain showed Acute non hemorrhagic infarct extends from the lateral aspect of the right lenticular nucleus superiorly into the mid aspect of the right corona radiata.  MRA of the brain showed Intracranial atherosclerotic type changes, Small bulge superior margin P1 segment left posterior cerebral artery may represent a tiny (1.1 mm) aneurysm versus result of atherosclerotic type changes.  -2-D echo showed EF of 55-60%, no regional wall motion abnormalities, no patent foramen ovale  - Carotid Doppler showed no evidence of hemodynamically significant internal carotid artery stenosis  - Continue xarelto for secondary stroke prevention  - PT,  OT evaluation done, recommended home health PT, OT, 24 7 supervision, patient lives with her 3 daughters, home health was arranged with walker. - LDL 80, goal less than 100, hence not started on statin  Hypertension with orthostatsis - Started on Norvasc 2.5 mg daily with instructions as above.   GEN OSTEOARTHROSIS INVOLVING MULTIPLE SITES  - Pain controlled   Atrial flutter  - Currently rate controlled, continue xarelto     Day of Discharge BP 182/94  Pulse 69  Temp(Src) 97.4 F (36.3 C) (Oral)  Resp 18  Ht 5\' 6"  (1.676 m)  Wt 55.5 kg (122 lb 5.7 oz)  BMI 19.76 kg/m2  SpO2 100%  Physical Exam: General: Alert and awake oriented x3 not in any acute distress, no significant dysarthria today. CVS: S1-S2 clear no murmur rubs or gallops Chest: clear to auscultation bilaterally, no wheezing rales or rhonchi Abdomen: soft nontender, nondistended, normal bowel sounds Extremities: no cyanosis, clubbing or edema noted bilaterally Neuro: Cranial nerves II-XII intact, slightly weaker on left side  The results of significant diagnostics from this hospitalization (including imaging, microbiology, ancillary and laboratory) are listed below for reference.    LAB RESULTS: Basic Metabolic Panel:  Recent Labs Lab 08/01/12 1320  NA 144  K 4.1  CL 106  CO2 29  GLUCOSE 154*  BUN 16  CREATININE 1.00  CALCIUM 9.4   Liver Function Tests:  Recent Labs Lab 08/01/12 1320  AST 24  ALT 11  ALKPHOS 69  BILITOT 0.5  PROT 7.0  ALBUMIN 4.0   No results found for this basename: LIPASE, AMYLASE,  in the last 168 hours No results found for this basename: AMMONIA,  in the last 168 hours CBC:  Recent Labs Lab 08/01/12 1320  WBC 6.2  NEUTROABS 4.2  HGB 14.7  HCT 43.8  MCV 90.1  PLT 165   Cardiac Enzymes:  Recent Labs Lab 08/01/12 1320  TROPONINI <0.30   BNP: No components found with this basename: POCBNP,  CBG:  Recent Labs Lab 08/02/12 2055 08/03/12 0741  GLUCAP  112* 94    Significant Diagnostic Studies:  Ct Head Wo Contrast  08/01/2012  *RADIOLOGY REPORT*  Clinical Data: Slurred speech.  Code stroke.  CT HEAD WITHOUT CONTRAST  Technique:  Contiguous axial images were obtained from the base of the skull through the vertex without contrast.  Comparison: 05/10/2010.  Findings: No intracranial hemorrhage.  Prominent small vessel disease type changes without CT evidence of large acute infarct.  Calcified pineal gland slightly heterogeneous in appearance unchanged.  No other intracranial mass lesion detected on this unenhanced exam.  Global atrophy without hydrocephalus.  Vascular calcifications.  Mastoid air cells, middle ear cavities and visualized sinuses are clear.  IMPRESSION: No intracranial hemorrhage.  Prominent small vessel disease type changes without CT evidence of large acute infarct.  Please see above.  Code stroke called to Clydie Braun physician's assistant 08/01/2012 1:58 p.m.  Original Report Authenticated By: Lacy Duverney, M.D.    Mr [facility] Wo Contrast  08/02/2012  *RADIOLOGY REPORT*  Clinical Data:  Difficulty speaking.  Minimal left-sided facial droop and left-sided weakness.  MRI BRAIN WITHOUT CONTRAST MRA HEAD WITHOUT CONTRAST  Technique: Multiplanar, multiecho pulse sequences of the brain and surrounding structures were obtained according to standard protocol without intravenous contrast.  Angiographic images of the head were obtained using MRA technique without contrast.  Comparison: 08/01/2012 head CT.  MRI HEAD  Findings:   Acute non hemorrhagic infarct extends from the lateral aspect of the right lenticular nucleus superiorly into the mid aspect of the right corona radiata.  No intracranial hemorrhage.  Moderate small vessel disease type changes.  Global atrophy without hydrocephalus.  No intracranial mass lesion detected on this unenhanced exam. Cervical spondylotic changes C3-4 with slight cord flattening.  Major intracranial vascular structures  are patent.  IMPRESSION:  Acute non hemorrhagic infarct extends from the lateral aspect of the right lenticular nucleus superiorly into the mid aspect of the right corona radiata.  Please see above.  MRA HEAD  Findings: Anterior circulation without medium or large size vessel significant stenosis or occlusion.  Mild narrowing distal A1 segment of the right anterior cerebral artery.  Moderate to marked focal stenosis A2 segment left anterior cerebral artery.  Middle cerebral artery mild to moderate branch vessel irregularity bilaterally.  Codominant vertebral arteries without high-grade stenosis.  Nonvisualization right PICA and left AICA.  Mild irregularity superior cerebellar artery bilaterally.  Moderate narrowing involving portions of the distal posterior cerebral artery branches bilaterally.  Small bulge superior margin P1 segment left posterior cerebral artery may represent a tiny (1.1 mm) aneurysm versus result of atherosclerotic type changes.  IMPRESSION: Intracranial atherosclerotic type changes as detailed above.  Small bulge superior margin P1 segment left posterior cerebral artery may represent a tiny (1.1 mm) aneurysm versus result of atherosclerotic type changes.  This has been made a PRA call report utilizing dashboard call feature.   Original Report Authenticated By: Lacy Duverney, M.D.    Mr Brain Wo Contrast  08/02/2012  *RADIOLOGY REPORT*  Clinical Data:  Difficulty speaking.  Minimal left-sided facial droop and left-sided weakness.  MRI BRAIN WITHOUT CONTRAST MRA HEAD WITHOUT CONTRAST  Technique: Multiplanar, multiecho pulse sequences of the brain and surrounding structures were obtained according to standard protocol without intravenous contrast.  Angiographic images of the head were obtained using MRA technique without contrast.  Comparison: 08/01/2012 head CT.  MRI HEAD  Findings:   Acute non hemorrhagic infarct extends from the lateral aspect of the right lenticular nucleus superiorly into  the mid aspect of the right corona radiata.  No intracranial hemorrhage.  Moderate small vessel disease type changes.  Global atrophy without hydrocephalus.  No intracranial mass lesion detected on this unenhanced exam. Cervical spondylotic changes C3-4 with slight cord flattening.  Major intracranial vascular structures are patent.  IMPRESSION:  Acute non hemorrhagic infarct extends from the lateral aspect of the right lenticular nucleus superiorly into the mid aspect of the right corona radiata.  Please see above.  MRA HEAD  Findings: Anterior circulation without medium or large size vessel significant stenosis or occlusion.  Mild narrowing distal A1 segment of the right anterior cerebral artery.  Moderate to marked focal stenosis A2 segment left anterior cerebral artery.  Middle cerebral artery mild to moderate branch vessel irregularity bilaterally.  Codominant vertebral arteries without high-grade stenosis.  Nonvisualization right PICA and left AICA.  Mild irregularity superior cerebellar  artery bilaterally.  Moderate narrowing involving portions of the distal posterior cerebral artery branches bilaterally.  Small bulge superior margin P1 segment left posterior cerebral artery may represent a tiny (1.1 mm) aneurysm versus result of atherosclerotic type changes.  IMPRESSION: Intracranial atherosclerotic type changes as detailed above.  Small bulge superior margin P1 segment left posterior cerebral artery may represent a tiny (1.1 mm) aneurysm versus result of atherosclerotic type changes.  This has been made a PRA call report utilizing dashboard call feature.   Original Report Authenticated By: Lacy Duverney, M.D.     2D ECHO: Study Conclusions  - Left ventricle: The cavity size was normal. Wall thickness was normal. Systolic function was normal. The estimated ejection fraction was in the range of 55% to 60%. Wall motion was normal; there were no regional wall motion abnormalities. - Aortic valve:  Trivial regurgitation. - Mitral valve: Calcified annulus. Mild regurgitation. - Left atrium: The atrium was moderately dilated. - Right atrium: The atrium was mildly to moderately dilated. - Atrial septum: No defect or patent foramen ovale was identified. - Tricuspid valve: Mild-moderate regurgitation.    Disposition and Follow-up: Discharge Orders   Future Orders Complete By Expires     Diet - low sodium heart healthy  As directed     Discharge instructions  As directed     Comments:      Please note that your Xarelto dose has been decreased to 15mg  daily with supper.  You have been started on low dose norvasc for high BP, please cut in half dose or stop it if you feel worsened dizziness or  SBP is less than 100.    Increase activity slowly  As directed         DISPOSITION: Home  DIET: Heart healthy diet  ACTIVITY: As tolerated  DISCHARGE FOLLOW-UP Follow-up Information   Follow up with Letitia Libra, Ala Dach, MD. Schedule an appointment as soon as possible for a visit in 2 weeks. (please check BP and adjust medications as needed)    Contact information:   [address] [place] [address] [phone]       Follow up with Gates Rigg, MD. Schedule an appointment as soon as possible for a visit in 2 months.   Contact information:   [address] [place] [address] [phone]       Time spent on Discharge: 40 mins  Signed:   RAI,RIPUDEEP M.D. [facility] 08/03/2012, 9:56 AM Pager: [phone]

## 2012-08-03 NOTE — Progress Notes (Signed)
DC orders received.  Patient stable with no S/S of distress.  Medication and discharge information reviewed with patient and patient's daughters.  Patient DC home with daughters.  Patient's RX called into [facility] in [facility]. [place], Mitzi Hansen

## 2012-08-03 NOTE — Progress Notes (Signed)
Physical Therapy Treatment Patient Details Name: [name] MRN: [id number] DOB: [birth year] Today's Date: 08/03/2012 Time: 4098-1191 PT Time Calculation (min): 30 min  PT Assessment / Plan / Recommendation Comments on Treatment Session  Pt s/p CVA with decr mobility secondary to decr endurance and decr balance.  Will benefit from continued PT to address endurance and balance issues.  Pt will need a rollator and HHPT f/u.  Daughters verbalize understanding of how to cue pt. and want to take pt home.     Follow Up Recommendations  Home health PT;Supervision/Assistance - 24 hour;Supervision for mobility/OOB                 Equipment Recommendations  Other (comment) (rollator)        Frequency Min 3X/week   Plan Discharge plan remains appropriate;Frequency remains appropriate    Precautions / Restrictions Precautions Precautions: Fall Restrictions Weight Bearing Restrictions: No   Pertinent Vitals/Pain VSS, No pain    Mobility  Bed Mobility Bed Mobility: Supine to Sit;Sitting - Scoot to Edge of Bed;Sit to Supine Supine to Sit: 5: Supervision Sitting - Scoot to Edge of Bed: 5: Supervision Sit to Supine: 5: Supervision Details for Bed Mobility Assistance: VCs for controlled movement Transfers Transfers: Sit to Stand;Stand to Sit Sit to Stand: 4: Min guard;From bed Stand to Sit: 4: Min guard;To bed Details for Transfer Assistance: VCs for hand placement and positioning, pt with some impulsivity and initial instability with standing. Ambulation/Gait Ambulation/Gait Assistance: 4: Min guard;4: Min Environmental consultant (Feet): 500 Feet Assistive device: Rolling walker;None Ambulation/Gait Assistance Details: Ambulated first attempt 250 feet without RW with pt with multiple LOB especially with challenges and head turns.  Pt ambulated much better with RW but still needs cues for safety awareness when using RW to stay close to the RW and to steer RW at times.  Pt tends to  flex forward needing cues to stand tall.   Gait Pattern: Decreased stride length;Trunk flexed;Narrow base of support Gait velocity: WFL for community ambulation General Gait Details: instability noted Stairs: No Wheelchair Mobility Wheelchair Mobility: No    PT Goals Acute Rehab PT Goals PT Goal: Sit to Stand - Progress: Progressing toward goal PT Goal: Ambulate - Progress: Progressing toward goal  Visit Information  Last PT Received On: 08/03/12 Assistance Needed: +1    Subjective Data  Subjective: "I walk with my daughters to the bathroom."   Cognition  Cognition Arousal/Alertness: Awake/alert Behavior During Therapy: WFL for tasks assessed/performed Overall Cognitive Status: Impaired/Different from baseline Area of Impairment: Awareness;Safety/judgement Safety/Judgement: Decreased awareness of safety Awareness: Intellectual General Comments: poor safety awareness at times needing redirection frequently    Balance  High Level Balance High Level Balance Activites: Side stepping;Backward walking;Direction changes;Turns;Head turns High Level Balance Comments: some instability with balance activities requiring tactile guard and minimal assist at times  End of Session PT - End of Session Equipment Utilized During Treatment: Gait belt Activity Tolerance: Patient limited by fatigue Patient left: in bed;with call bell/phone within reach;with family/visitor present Nurse Communication: Mobility status       INGOLD,Jabin Tapp 08/03/2012, 11:50 AM [facility] [phone] (pager)

## 2012-08-03 NOTE — Progress Notes (Addendum)
PT NOTE  Treatment performed this am.  Full note to follow.  Patient will need a rollator with a seat (4 wheeled walker) for home use.  MD:  Daughter wants prescription so she can go to equipment store and pick the one she wants.  Please write prescription for "4 wheeled rolling walker with seat".  Also will need HHPT f/u.  Thanks.  [facility] [phone] (pager)

## 2012-08-05 DIAGNOSIS — I69921 Dysphasia following unspecified cerebrovascular disease: Secondary | ICD-10-CM | POA: Diagnosis not present

## 2012-08-05 DIAGNOSIS — I951 Orthostatic hypotension: Secondary | ICD-10-CM | POA: Diagnosis not present

## 2012-08-05 DIAGNOSIS — Z9181 History of falling: Secondary | ICD-10-CM | POA: Diagnosis not present

## 2012-08-05 DIAGNOSIS — I69959 Hemiplegia and hemiparesis following unspecified cerebrovascular disease affecting unspecified side: Secondary | ICD-10-CM | POA: Diagnosis not present

## 2012-08-05 DIAGNOSIS — R269 Unspecified abnormalities of gait and mobility: Secondary | ICD-10-CM | POA: Diagnosis not present

## 2012-08-05 DIAGNOSIS — I4892 Unspecified atrial flutter: Secondary | ICD-10-CM | POA: Diagnosis not present

## 2012-08-05 DIAGNOSIS — M1990 Unspecified osteoarthritis, unspecified site: Secondary | ICD-10-CM | POA: Diagnosis not present

## 2012-08-05 DIAGNOSIS — I10 Essential (primary) hypertension: Secondary | ICD-10-CM | POA: Diagnosis not present

## 2012-08-08 DIAGNOSIS — I951 Orthostatic hypotension: Secondary | ICD-10-CM | POA: Diagnosis not present

## 2012-08-08 DIAGNOSIS — I69959 Hemiplegia and hemiparesis following unspecified cerebrovascular disease affecting unspecified side: Secondary | ICD-10-CM | POA: Diagnosis not present

## 2012-08-08 DIAGNOSIS — I69921 Dysphasia following unspecified cerebrovascular disease: Secondary | ICD-10-CM | POA: Diagnosis not present

## 2012-08-08 DIAGNOSIS — I10 Essential (primary) hypertension: Secondary | ICD-10-CM | POA: Diagnosis not present

## 2012-08-08 DIAGNOSIS — R269 Unspecified abnormalities of gait and mobility: Secondary | ICD-10-CM | POA: Diagnosis not present

## 2012-08-08 DIAGNOSIS — I4892 Unspecified atrial flutter: Secondary | ICD-10-CM | POA: Diagnosis not present

## 2012-08-09 DIAGNOSIS — I4892 Unspecified atrial flutter: Secondary | ICD-10-CM | POA: Diagnosis not present

## 2012-08-09 DIAGNOSIS — I10 Essential (primary) hypertension: Secondary | ICD-10-CM | POA: Diagnosis not present

## 2012-08-09 DIAGNOSIS — I69921 Dysphasia following unspecified cerebrovascular disease: Secondary | ICD-10-CM | POA: Diagnosis not present

## 2012-08-09 DIAGNOSIS — I951 Orthostatic hypotension: Secondary | ICD-10-CM | POA: Diagnosis not present

## 2012-08-09 DIAGNOSIS — R269 Unspecified abnormalities of gait and mobility: Secondary | ICD-10-CM | POA: Diagnosis not present

## 2012-08-09 DIAGNOSIS — I69959 Hemiplegia and hemiparesis following unspecified cerebrovascular disease affecting unspecified side: Secondary | ICD-10-CM | POA: Diagnosis not present

## 2012-08-10 DIAGNOSIS — I4892 Unspecified atrial flutter: Secondary | ICD-10-CM | POA: Diagnosis not present

## 2012-08-10 DIAGNOSIS — Z0289 Encounter for other administrative examinations: Secondary | ICD-10-CM

## 2012-08-10 DIAGNOSIS — R269 Unspecified abnormalities of gait and mobility: Secondary | ICD-10-CM | POA: Diagnosis not present

## 2012-08-10 DIAGNOSIS — I69921 Dysphasia following unspecified cerebrovascular disease: Secondary | ICD-10-CM | POA: Diagnosis not present

## 2012-08-10 DIAGNOSIS — I951 Orthostatic hypotension: Secondary | ICD-10-CM | POA: Diagnosis not present

## 2012-08-10 DIAGNOSIS — I10 Essential (primary) hypertension: Secondary | ICD-10-CM | POA: Diagnosis not present

## 2012-08-10 DIAGNOSIS — I69959 Hemiplegia and hemiparesis following unspecified cerebrovascular disease affecting unspecified side: Secondary | ICD-10-CM | POA: Diagnosis not present

## 2012-08-11 DIAGNOSIS — I69959 Hemiplegia and hemiparesis following unspecified cerebrovascular disease affecting unspecified side: Secondary | ICD-10-CM | POA: Diagnosis not present

## 2012-08-11 DIAGNOSIS — I4892 Unspecified atrial flutter: Secondary | ICD-10-CM | POA: Diagnosis not present

## 2012-08-11 DIAGNOSIS — I951 Orthostatic hypotension: Secondary | ICD-10-CM | POA: Diagnosis not present

## 2012-08-11 DIAGNOSIS — I69921 Dysphasia following unspecified cerebrovascular disease: Secondary | ICD-10-CM | POA: Diagnosis not present

## 2012-08-11 DIAGNOSIS — R269 Unspecified abnormalities of gait and mobility: Secondary | ICD-10-CM | POA: Diagnosis not present

## 2012-08-11 DIAGNOSIS — I10 Essential (primary) hypertension: Secondary | ICD-10-CM | POA: Diagnosis not present

## 2012-08-14 DIAGNOSIS — I69959 Hemiplegia and hemiparesis following unspecified cerebrovascular disease affecting unspecified side: Secondary | ICD-10-CM | POA: Diagnosis not present

## 2012-08-14 DIAGNOSIS — I951 Orthostatic hypotension: Secondary | ICD-10-CM | POA: Diagnosis not present

## 2012-08-14 DIAGNOSIS — I4892 Unspecified atrial flutter: Secondary | ICD-10-CM | POA: Diagnosis not present

## 2012-08-14 DIAGNOSIS — I10 Essential (primary) hypertension: Secondary | ICD-10-CM | POA: Diagnosis not present

## 2012-08-14 DIAGNOSIS — I69921 Dysphasia following unspecified cerebrovascular disease: Secondary | ICD-10-CM | POA: Diagnosis not present

## 2012-08-14 DIAGNOSIS — R269 Unspecified abnormalities of gait and mobility: Secondary | ICD-10-CM | POA: Diagnosis not present

## 2012-08-15 DIAGNOSIS — I69959 Hemiplegia and hemiparesis following unspecified cerebrovascular disease affecting unspecified side: Secondary | ICD-10-CM | POA: Diagnosis not present

## 2012-08-15 DIAGNOSIS — I10 Essential (primary) hypertension: Secondary | ICD-10-CM | POA: Diagnosis not present

## 2012-08-15 DIAGNOSIS — I4892 Unspecified atrial flutter: Secondary | ICD-10-CM | POA: Diagnosis not present

## 2012-08-15 DIAGNOSIS — R269 Unspecified abnormalities of gait and mobility: Secondary | ICD-10-CM | POA: Diagnosis not present

## 2012-08-15 DIAGNOSIS — I69921 Dysphasia following unspecified cerebrovascular disease: Secondary | ICD-10-CM | POA: Diagnosis not present

## 2012-08-15 DIAGNOSIS — I951 Orthostatic hypotension: Secondary | ICD-10-CM | POA: Diagnosis not present

## 2012-08-16 DIAGNOSIS — I951 Orthostatic hypotension: Secondary | ICD-10-CM | POA: Diagnosis not present

## 2012-08-16 DIAGNOSIS — I4892 Unspecified atrial flutter: Secondary | ICD-10-CM | POA: Diagnosis not present

## 2012-08-16 DIAGNOSIS — I69959 Hemiplegia and hemiparesis following unspecified cerebrovascular disease affecting unspecified side: Secondary | ICD-10-CM | POA: Diagnosis not present

## 2012-08-16 DIAGNOSIS — I10 Essential (primary) hypertension: Secondary | ICD-10-CM | POA: Diagnosis not present

## 2012-08-16 DIAGNOSIS — R269 Unspecified abnormalities of gait and mobility: Secondary | ICD-10-CM | POA: Diagnosis not present

## 2012-08-16 DIAGNOSIS — I69921 Dysphasia following unspecified cerebrovascular disease: Secondary | ICD-10-CM | POA: Diagnosis not present

## 2012-08-17 ENCOUNTER — Telehealth: Payer: Self-pay | Admitting: Internal Medicine

## 2012-08-17 DIAGNOSIS — I69921 Dysphasia following unspecified cerebrovascular disease: Secondary | ICD-10-CM | POA: Diagnosis not present

## 2012-08-17 DIAGNOSIS — I951 Orthostatic hypotension: Secondary | ICD-10-CM | POA: Diagnosis not present

## 2012-08-17 DIAGNOSIS — R269 Unspecified abnormalities of gait and mobility: Secondary | ICD-10-CM | POA: Diagnosis not present

## 2012-08-17 DIAGNOSIS — I4892 Unspecified atrial flutter: Secondary | ICD-10-CM | POA: Diagnosis not present

## 2012-08-17 DIAGNOSIS — I69959 Hemiplegia and hemiparesis following unspecified cerebrovascular disease affecting unspecified side: Secondary | ICD-10-CM | POA: Diagnosis not present

## 2012-08-17 DIAGNOSIS — I10 Essential (primary) hypertension: Secondary | ICD-10-CM | POA: Diagnosis not present

## 2012-08-17 NOTE — Telephone Encounter (Signed)
OK to give verbal order for speech therapy

## 2012-08-17 NOTE — Telephone Encounter (Signed)
Please advise 

## 2012-08-17 NOTE — Telephone Encounter (Signed)
Left message for verbal order for speech therapy.

## 2012-08-17 NOTE — Telephone Encounter (Signed)
Toniann Fail from [place] states that she needs a verbal order from Dr. Abner Greenspan saying that it is okay for her to treat patient for speech therapy. Toniann Fail states that it is okay for you to leave a detailed message on her cell.

## 2012-08-18 ENCOUNTER — Ambulatory Visit (INDEPENDENT_AMBULATORY_CARE_PROVIDER_SITE_OTHER): Payer: Medicare Other | Admitting: Family

## 2012-08-18 ENCOUNTER — Encounter: Payer: Self-pay | Admitting: Family

## 2012-08-18 VITALS — BP 122/82 | HR 69 | Temp 97.6°F | Resp 14 | Ht 67.0 in | Wt 123.0 lb

## 2012-08-18 DIAGNOSIS — I4892 Unspecified atrial flutter: Secondary | ICD-10-CM | POA: Diagnosis not present

## 2012-08-18 DIAGNOSIS — I6350 Cerebral infarction due to unspecified occlusion or stenosis of unspecified cerebral artery: Secondary | ICD-10-CM

## 2012-08-18 DIAGNOSIS — I639 Cerebral infarction, unspecified: Secondary | ICD-10-CM

## 2012-08-18 NOTE — Assessment & Plan Note (Signed)
The pt continues speech therapy and PT.  She has 24 hour supervision. Seems to be improving well. Continue xarelto. I did advise her to limit her use of excedrin migraine and instead use Tylenol if able.

## 2012-08-18 NOTE — Assessment & Plan Note (Addendum)
Clinically stable on xarelto, has follow up with Dr. Jacinto Halim. Family brings BP log today which shows well controlled blood pressure. They are only using norvasc if blood pressure runs high.

## 2012-08-18 NOTE — Patient Instructions (Addendum)
Please schedule a follow up appointment in 3 months.

## 2012-08-18 NOTE — Progress Notes (Signed)
Subjective:    Patient ID: [name], female    DOB: Dec 31, 1924, 79 y.o.   MRN: [id number]  HPI  [name] is an 79 yr old female who presents today for hospital follow up.  Hospital records are reviewed. She was admitted 5/6-5/8 with Acute non-hemorrhagic infarct which on MRI.  She did completed a 2D echo which showed normal LV function.  Carotid duplex did not show any significant coronary artery stenosis.  She had a PT OT evaluation and it was recommended that she continue [facility] PT/OT nad that she have 24 hour supervision.  LDL was noted to be 80- she is not on a statin.  She was continued on xarelto for her hx of atrial flutter her dose was decreased form 20mg  15mg . She was instructed to follow up the Dr. Pearlean Brownie- neuro in 2 months.    OT has signed off.  Speech therapist still working with her.  PT is coming twice a week. Family helps pt with BID exercising.  Daughter notes mild drooling, but no difficulty (or coughing) with swallowing.  She has been walking with a walker.  Has some left sided residual deficits.  Denies visual impairment.  Short term memory continues to be an issue but this is not new for her.    Atrial flutter- she is scheduled to see Dr. Jacinto Halim in July. Denies CP/SOB/Swelling.   Review of Systems    see HPI  Past Medical History  Diagnosis Date  . Arthritis   . Hypertension   . Migraine   . Colon polyp   . Hearing difficulty   . Torus palatinus   . Atrial flutter January, 2012    History   Social History  . Marital Status: Widowed    Spouse Name: N/A    Number of Children: N/A  . Years of Education: N/A   Occupational History  . 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    Social History Main Topics  . Smoking status: Former Games developer  . Smokeless tobacco: Never Used  . Alcohol Use: 0.0 oz/week     Comment: 08/01/2012 "glass of wine on special occasions"  . Drug Use: No  . Sexually Active: No   Other Topics Concern  . Not on file   Social History Narrative   . No narrative on file    Past Surgical History  Procedure Laterality Date  . Cataract extraction w/ intraocular lens  implant, bilateral  2006-2008  . Ganglion cyst excision Bilateral 1938,1954,2003,2005    "wrists/hand" (08/01/2012)  . Cardioversion  05/19/2010    Dr. Jacinto Halim  . Tonsillectomy  ~ 1935  . Appendectomy  02/19/53    `    Family History  Problem Relation Age of Onset  . Colon cancer    . Breast cancer    . Brain cancer    . Cancer Mother     breast  . Aneurysm Mother     brain  . Heart attack Neg Hx   . Diabetes Neg Hx   . Hypertension Neg Hx     Allergies  Allergen Reactions  . Tramadol Hcl Other (See Comments)     lethargy, nausea  . Alendronate Sodium Rash and Other (See Comments)    puffy eyes    Current Outpatient Prescriptions on File Prior to Visit  Medication Sig Dispense Refill  . amLODipine (NORVASC) 2.5 MG tablet Take 1 tablet (2.5 mg total) by mouth daily. Take 1 pill (2.5mg ) if SBP above 150, 2 pills (5mg )  if SBP above 175 or DBP above100  60 tablet  3  . aspirin-acetaminophen-caffeine (EXCEDRIN MIGRAINE) 250-250-65 MG per tablet Take 2 tablets by mouth daily as needed for pain (for migraines).      . Calcium-Vitamin D-Vitamin K 500-100-40 MG-UNT-MCG CHEW Chew 1 tablet by mouth 3 (three) times daily.      . midodrine (PROAMATINE) 5 MG tablet Take 1 tablet (5 mg total) by mouth 3 (three) times daily with meals as needed (While awake for dizziness).  90 tablet  6  . Multiple Vitamin (MULTIVITAMIN WITH MINERALS) TABS Take 1 tablet by mouth daily.      . Rivaroxaban (XARELTO) 15 MG TABS tablet Take 1 tablet (15 mg total) by mouth daily with supper.  30 tablet  3  . UNABLE TO FIND Wheeled rolling walker with seat, 5 inch wheels   Diagnosis: Acute CVA/stroke, gait instability  1 Mutually Defined  0   No current facility-administered medications on file prior to visit.    BP 122/82  Pulse 69  Temp(Src) 97.6 F (36.4 C) (Oral)  Resp 14  Ht 5'  7" (1.702 m)  Wt 123 lb (55.792 kg)  BMI 19.26 kg/m2  SpO2 98%    Objective:   Physical Exam  Constitutional: She is oriented to person, place, and time. She appears well-developed and well-nourished. No distress.  Cardiovascular: Normal rate and regular rhythm.   No murmur heard. Pulmonary/Chest: Effort normal. No respiratory distress. She has no wheezes. She has no rales. She exhibits no tenderness.  Neurological: She is alert and oriented to person, place, and time. No cranial nerve deficit.  No facial droop is noted. Facial symmetry is noted Tongue midline Left hand grasp is very mildly weaker than the right. Bilateral LE strength is 4-5/5  Skin: Skin is warm and dry.  Psychiatric: She has a normal mood and affect. Her behavior is normal. Judgment and thought content normal.          Assessment & Plan:

## 2012-08-22 DIAGNOSIS — I10 Essential (primary) hypertension: Secondary | ICD-10-CM | POA: Diagnosis not present

## 2012-08-22 DIAGNOSIS — I951 Orthostatic hypotension: Secondary | ICD-10-CM | POA: Diagnosis not present

## 2012-08-22 DIAGNOSIS — I69959 Hemiplegia and hemiparesis following unspecified cerebrovascular disease affecting unspecified side: Secondary | ICD-10-CM | POA: Diagnosis not present

## 2012-08-22 DIAGNOSIS — I4892 Unspecified atrial flutter: Secondary | ICD-10-CM | POA: Diagnosis not present

## 2012-08-22 DIAGNOSIS — R269 Unspecified abnormalities of gait and mobility: Secondary | ICD-10-CM | POA: Diagnosis not present

## 2012-08-22 DIAGNOSIS — I69921 Dysphasia following unspecified cerebrovascular disease: Secondary | ICD-10-CM | POA: Diagnosis not present

## 2012-08-23 DIAGNOSIS — I69959 Hemiplegia and hemiparesis following unspecified cerebrovascular disease affecting unspecified side: Secondary | ICD-10-CM | POA: Diagnosis not present

## 2012-08-23 DIAGNOSIS — I69921 Dysphasia following unspecified cerebrovascular disease: Secondary | ICD-10-CM | POA: Diagnosis not present

## 2012-08-23 DIAGNOSIS — I4892 Unspecified atrial flutter: Secondary | ICD-10-CM | POA: Diagnosis not present

## 2012-08-23 DIAGNOSIS — R269 Unspecified abnormalities of gait and mobility: Secondary | ICD-10-CM | POA: Diagnosis not present

## 2012-08-23 DIAGNOSIS — I951 Orthostatic hypotension: Secondary | ICD-10-CM | POA: Diagnosis not present

## 2012-08-23 DIAGNOSIS — I10 Essential (primary) hypertension: Secondary | ICD-10-CM | POA: Diagnosis not present

## 2012-08-24 DIAGNOSIS — I951 Orthostatic hypotension: Secondary | ICD-10-CM | POA: Diagnosis not present

## 2012-08-24 DIAGNOSIS — I4892 Unspecified atrial flutter: Secondary | ICD-10-CM | POA: Diagnosis not present

## 2012-08-24 DIAGNOSIS — I10 Essential (primary) hypertension: Secondary | ICD-10-CM | POA: Diagnosis not present

## 2012-08-24 DIAGNOSIS — I69959 Hemiplegia and hemiparesis following unspecified cerebrovascular disease affecting unspecified side: Secondary | ICD-10-CM | POA: Diagnosis not present

## 2012-08-24 DIAGNOSIS — I69921 Dysphasia following unspecified cerebrovascular disease: Secondary | ICD-10-CM | POA: Diagnosis not present

## 2012-08-24 DIAGNOSIS — R269 Unspecified abnormalities of gait and mobility: Secondary | ICD-10-CM | POA: Diagnosis not present

## 2012-08-25 DIAGNOSIS — I4892 Unspecified atrial flutter: Secondary | ICD-10-CM | POA: Diagnosis not present

## 2012-08-25 DIAGNOSIS — R269 Unspecified abnormalities of gait and mobility: Secondary | ICD-10-CM | POA: Diagnosis not present

## 2012-08-25 DIAGNOSIS — I69959 Hemiplegia and hemiparesis following unspecified cerebrovascular disease affecting unspecified side: Secondary | ICD-10-CM | POA: Diagnosis not present

## 2012-08-25 DIAGNOSIS — I951 Orthostatic hypotension: Secondary | ICD-10-CM | POA: Diagnosis not present

## 2012-08-25 DIAGNOSIS — I10 Essential (primary) hypertension: Secondary | ICD-10-CM | POA: Diagnosis not present

## 2012-08-25 DIAGNOSIS — I69921 Dysphasia following unspecified cerebrovascular disease: Secondary | ICD-10-CM | POA: Diagnosis not present

## 2012-08-26 DIAGNOSIS — I951 Orthostatic hypotension: Secondary | ICD-10-CM | POA: Diagnosis not present

## 2012-08-26 DIAGNOSIS — I69959 Hemiplegia and hemiparesis following unspecified cerebrovascular disease affecting unspecified side: Secondary | ICD-10-CM | POA: Diagnosis not present

## 2012-08-26 DIAGNOSIS — R269 Unspecified abnormalities of gait and mobility: Secondary | ICD-10-CM | POA: Diagnosis not present

## 2012-08-26 DIAGNOSIS — I69921 Dysphasia following unspecified cerebrovascular disease: Secondary | ICD-10-CM | POA: Diagnosis not present

## 2012-08-26 DIAGNOSIS — I4892 Unspecified atrial flutter: Secondary | ICD-10-CM | POA: Diagnosis not present

## 2012-08-26 DIAGNOSIS — I10 Essential (primary) hypertension: Secondary | ICD-10-CM | POA: Diagnosis not present

## 2012-08-28 DIAGNOSIS — I10 Essential (primary) hypertension: Secondary | ICD-10-CM | POA: Diagnosis not present

## 2012-08-28 DIAGNOSIS — I69921 Dysphasia following unspecified cerebrovascular disease: Secondary | ICD-10-CM | POA: Diagnosis not present

## 2012-08-28 DIAGNOSIS — I951 Orthostatic hypotension: Secondary | ICD-10-CM | POA: Diagnosis not present

## 2012-08-28 DIAGNOSIS — R269 Unspecified abnormalities of gait and mobility: Secondary | ICD-10-CM | POA: Diagnosis not present

## 2012-08-28 DIAGNOSIS — I4892 Unspecified atrial flutter: Secondary | ICD-10-CM | POA: Diagnosis not present

## 2012-08-28 DIAGNOSIS — I69959 Hemiplegia and hemiparesis following unspecified cerebrovascular disease affecting unspecified side: Secondary | ICD-10-CM | POA: Diagnosis not present

## 2012-08-29 DIAGNOSIS — I951 Orthostatic hypotension: Secondary | ICD-10-CM | POA: Diagnosis not present

## 2012-08-29 DIAGNOSIS — I69959 Hemiplegia and hemiparesis following unspecified cerebrovascular disease affecting unspecified side: Secondary | ICD-10-CM | POA: Diagnosis not present

## 2012-08-29 DIAGNOSIS — I4892 Unspecified atrial flutter: Secondary | ICD-10-CM | POA: Diagnosis not present

## 2012-08-29 DIAGNOSIS — I10 Essential (primary) hypertension: Secondary | ICD-10-CM | POA: Diagnosis not present

## 2012-08-29 DIAGNOSIS — R269 Unspecified abnormalities of gait and mobility: Secondary | ICD-10-CM | POA: Diagnosis not present

## 2012-08-29 DIAGNOSIS — I69921 Dysphasia following unspecified cerebrovascular disease: Secondary | ICD-10-CM | POA: Diagnosis not present

## 2012-08-30 DIAGNOSIS — I69921 Dysphasia following unspecified cerebrovascular disease: Secondary | ICD-10-CM | POA: Diagnosis not present

## 2012-08-30 DIAGNOSIS — I69959 Hemiplegia and hemiparesis following unspecified cerebrovascular disease affecting unspecified side: Secondary | ICD-10-CM | POA: Diagnosis not present

## 2012-08-30 DIAGNOSIS — I4892 Unspecified atrial flutter: Secondary | ICD-10-CM | POA: Diagnosis not present

## 2012-08-30 DIAGNOSIS — R269 Unspecified abnormalities of gait and mobility: Secondary | ICD-10-CM | POA: Diagnosis not present

## 2012-08-30 DIAGNOSIS — I10 Essential (primary) hypertension: Secondary | ICD-10-CM | POA: Diagnosis not present

## 2012-08-30 DIAGNOSIS — I951 Orthostatic hypotension: Secondary | ICD-10-CM | POA: Diagnosis not present

## 2012-09-01 DIAGNOSIS — I4892 Unspecified atrial flutter: Secondary | ICD-10-CM | POA: Diagnosis not present

## 2012-09-01 DIAGNOSIS — I951 Orthostatic hypotension: Secondary | ICD-10-CM | POA: Diagnosis not present

## 2012-09-01 DIAGNOSIS — I69959 Hemiplegia and hemiparesis following unspecified cerebrovascular disease affecting unspecified side: Secondary | ICD-10-CM | POA: Diagnosis not present

## 2012-09-01 DIAGNOSIS — I10 Essential (primary) hypertension: Secondary | ICD-10-CM | POA: Diagnosis not present

## 2012-09-01 DIAGNOSIS — R269 Unspecified abnormalities of gait and mobility: Secondary | ICD-10-CM | POA: Diagnosis not present

## 2012-09-01 DIAGNOSIS — I69921 Dysphasia following unspecified cerebrovascular disease: Secondary | ICD-10-CM | POA: Diagnosis not present

## 2012-09-04 DIAGNOSIS — I951 Orthostatic hypotension: Secondary | ICD-10-CM | POA: Diagnosis not present

## 2012-09-04 DIAGNOSIS — I4892 Unspecified atrial flutter: Secondary | ICD-10-CM | POA: Diagnosis not present

## 2012-09-04 DIAGNOSIS — I69921 Dysphasia following unspecified cerebrovascular disease: Secondary | ICD-10-CM | POA: Diagnosis not present

## 2012-09-04 DIAGNOSIS — I10 Essential (primary) hypertension: Secondary | ICD-10-CM | POA: Diagnosis not present

## 2012-09-04 DIAGNOSIS — R269 Unspecified abnormalities of gait and mobility: Secondary | ICD-10-CM | POA: Diagnosis not present

## 2012-09-04 DIAGNOSIS — I69959 Hemiplegia and hemiparesis following unspecified cerebrovascular disease affecting unspecified side: Secondary | ICD-10-CM | POA: Diagnosis not present

## 2012-09-05 DIAGNOSIS — I69959 Hemiplegia and hemiparesis following unspecified cerebrovascular disease affecting unspecified side: Secondary | ICD-10-CM | POA: Diagnosis not present

## 2012-09-05 DIAGNOSIS — I4892 Unspecified atrial flutter: Secondary | ICD-10-CM | POA: Diagnosis not present

## 2012-09-05 DIAGNOSIS — I10 Essential (primary) hypertension: Secondary | ICD-10-CM | POA: Diagnosis not present

## 2012-09-05 DIAGNOSIS — I69921 Dysphasia following unspecified cerebrovascular disease: Secondary | ICD-10-CM | POA: Diagnosis not present

## 2012-09-05 DIAGNOSIS — I951 Orthostatic hypotension: Secondary | ICD-10-CM | POA: Diagnosis not present

## 2012-09-05 DIAGNOSIS — R269 Unspecified abnormalities of gait and mobility: Secondary | ICD-10-CM | POA: Diagnosis not present

## 2012-09-08 DIAGNOSIS — I951 Orthostatic hypotension: Secondary | ICD-10-CM | POA: Diagnosis not present

## 2012-09-08 DIAGNOSIS — I69959 Hemiplegia and hemiparesis following unspecified cerebrovascular disease affecting unspecified side: Secondary | ICD-10-CM | POA: Diagnosis not present

## 2012-09-08 DIAGNOSIS — R269 Unspecified abnormalities of gait and mobility: Secondary | ICD-10-CM | POA: Diagnosis not present

## 2012-09-08 DIAGNOSIS — I69921 Dysphasia following unspecified cerebrovascular disease: Secondary | ICD-10-CM | POA: Diagnosis not present

## 2012-09-08 DIAGNOSIS — I10 Essential (primary) hypertension: Secondary | ICD-10-CM | POA: Diagnosis not present

## 2012-09-08 DIAGNOSIS — I4892 Unspecified atrial flutter: Secondary | ICD-10-CM | POA: Diagnosis not present

## 2012-09-12 DIAGNOSIS — I4892 Unspecified atrial flutter: Secondary | ICD-10-CM | POA: Diagnosis not present

## 2012-09-12 DIAGNOSIS — I69921 Dysphasia following unspecified cerebrovascular disease: Secondary | ICD-10-CM | POA: Diagnosis not present

## 2012-09-12 DIAGNOSIS — I10 Essential (primary) hypertension: Secondary | ICD-10-CM | POA: Diagnosis not present

## 2012-09-12 DIAGNOSIS — R269 Unspecified abnormalities of gait and mobility: Secondary | ICD-10-CM | POA: Diagnosis not present

## 2012-09-12 DIAGNOSIS — I69959 Hemiplegia and hemiparesis following unspecified cerebrovascular disease affecting unspecified side: Secondary | ICD-10-CM | POA: Diagnosis not present

## 2012-09-12 DIAGNOSIS — I951 Orthostatic hypotension: Secondary | ICD-10-CM | POA: Diagnosis not present

## 2012-09-13 DIAGNOSIS — H16229 Keratoconjunctivitis sicca, not specified as Sjogren's, unspecified eye: Secondary | ICD-10-CM | POA: Diagnosis not present

## 2012-09-13 DIAGNOSIS — H04129 Dry eye syndrome of unspecified lacrimal gland: Secondary | ICD-10-CM | POA: Diagnosis not present

## 2012-09-13 DIAGNOSIS — H353190 Nonexudative age-related macular degeneration, unspecified eye, stage unspecified: Secondary | ICD-10-CM | POA: Diagnosis not present

## 2012-09-20 DIAGNOSIS — I4892 Unspecified atrial flutter: Secondary | ICD-10-CM | POA: Diagnosis not present

## 2012-09-20 DIAGNOSIS — I69921 Dysphasia following unspecified cerebrovascular disease: Secondary | ICD-10-CM | POA: Diagnosis not present

## 2012-09-20 DIAGNOSIS — R269 Unspecified abnormalities of gait and mobility: Secondary | ICD-10-CM | POA: Diagnosis not present

## 2012-09-20 DIAGNOSIS — I10 Essential (primary) hypertension: Secondary | ICD-10-CM | POA: Diagnosis not present

## 2012-09-20 DIAGNOSIS — I69959 Hemiplegia and hemiparesis following unspecified cerebrovascular disease affecting unspecified side: Secondary | ICD-10-CM | POA: Diagnosis not present

## 2012-09-20 DIAGNOSIS — I951 Orthostatic hypotension: Secondary | ICD-10-CM | POA: Diagnosis not present

## 2012-09-22 DIAGNOSIS — H903 Sensorineural hearing loss, bilateral: Secondary | ICD-10-CM | POA: Diagnosis not present

## 2012-09-28 ENCOUNTER — Ambulatory Visit: Payer: Self-pay | Admitting: Neurology

## 2012-10-12 ENCOUNTER — Ambulatory Visit (INDEPENDENT_AMBULATORY_CARE_PROVIDER_SITE_OTHER): Payer: Medicare Other | Admitting: Nurse Practitioner

## 2012-10-12 ENCOUNTER — Encounter: Payer: Self-pay | Admitting: Nurse Practitioner

## 2012-10-12 VITALS — BP 173/103 | HR 61 | Temp 98.1°F | Ht 66.0 in | Wt 122.0 lb

## 2012-10-12 DIAGNOSIS — I69922 Dysarthria following unspecified cerebrovascular disease: Secondary | ICD-10-CM | POA: Diagnosis not present

## 2012-10-12 DIAGNOSIS — I639 Cerebral infarction, unspecified: Secondary | ICD-10-CM

## 2012-10-12 DIAGNOSIS — I6330 Cerebral infarction due to thrombosis of unspecified cerebral artery: Secondary | ICD-10-CM | POA: Diagnosis not present

## 2012-10-12 DIAGNOSIS — I69322 Dysarthria following cerebral infarction: Secondary | ICD-10-CM

## 2012-10-12 NOTE — Patient Instructions (Addendum)
PLAN: 1. Continue xarelto for secondary stroke prevention.  Maintain strict control of hypertension with blood pressure goal below 130/90, and lipids with LDL cholesterol goal below 100 mg/dL.   Followup in the future with me in 3 months.  STROKE/TIA INSTRUCTIONS SMOKING Cigarette smoking nearly doubles your risk of having a stroke & is the single most alterable risk factor  If you smoke or have smoked in the last 12 months, you are advised to quit smoking for your health.  Most of the excess cardiovascular risk related to smoking disappears within a year of stopping.  Ask you doctor about anti-smoking medications  [place] Quit Line: 1-800-QUIT NOW  Free Smoking Cessation Classes ([phone]  CHOLESTEROL Know your levels; limit fat & cholesterol in your diet  Lab Results  Component Value Date   CHOL 161 08/02/2012   HDL 65 08/02/2012   LDLCALC 80 08/02/2012   TRIG 78 08/02/2012   CHOLHDL 2.5 08/02/2012      Many patients benefit from treatment even if their cholesterol is at goal.  Goal: Total Cholesterol less than 160  Goal:  LDL less than 100  Goal:  HDL greater than 40  Goal:  Triglycerides less than 150  BLOOD PRESSURE American Stroke Association blood pressure target is less that 120/80 mm/Hg  Your discharge blood pressure is:     Monitor your blood pressure  Limit your salt and alcohol intake  Many individuals will require more than one medication for high blood pressure  DIABETES (A1c is a blood sugar average for last 3 months) Goal A1c is under 7% (A1c is blood sugar average for last 3 months)  Diabetes: No known diagnosis of diabetes    Lab Results  Component Value Date   HGBA1C 5.6 08/01/2012    Your A1c can be lowered with medications, healthy diet, and exercise.  Check your blood sugar as directed by your physician  Call your physician if you experience unexplained or low blood sugars.  PHYSICAL ACTIVITY/REHABILITATION Goal is 30 minutes at least 4 days per week    Activity decreases your risk of heart attack and stroke and makes your heart stronger.  It helps control your weight and blood pressure; helps you relax and can improve your mood.  Participate in a regular exercise program.  Talk with your doctor about the best form of exercise for you (dancing, walking, swimming, cycling).  DIET/WEIGHT Goal is to maintain a healthy weight  Your height is:  Height: 5\' 6"  (167.6 cm) Your current weight is:   Your body Mass Index (BMI) is:     Following the type of diet specifically designed for you will help prevent another stroke.  Your goal Body Mass Index (BMI) is 19-24.  Healthy food habits can help reduce 3 risk factors for stroke:  High cholesterol, hypertension, and excess weight.

## 2012-10-12 NOTE — Progress Notes (Signed)
[facility]  PATIENT: [name] DOB: [birth year]   HISTORY FROM: patient, chart, 2 daughters REASON FOR VISIT: stroke follow up   HISTORICAL  CHIEF COMPLAINT:  Chief Complaint  Patient presents with  . Follow-up    HISTORY OF PRESENT ILLNESS: [name] is an 79 y.o. female who LKW by telephone around 0730 on 08/01/2012. She went to the [facility] (included neck massage) where they felt like she was having difficulty speaking. That person did call the daughter to tell her about her mom. The patient also says that she felt that she had delay in her speech and was slower in ambulation. Once she looked in the mirror, she also noticed her facial droop. Daughter drove her to [facility] . Patient is on xarelto for afib/flutter (Dr. Jacinto Halim). CT was non-acute. Patient was called as code stroke; however, sx resolving upon transfer to [facility]. Patient has migraines of note. No complaints of neck pain or dizziness. Patient was not a TPA candidate secondary to delay in arrival. She was admitted for further evaluation and treatment.   Update 10/12/12:  Patient returns to office with 2 daughters for follow up.  She has completed outpatient PT, OT, ST.  Left sided weakness has mostly resolved.  Gait is steady, no falls at home.  Residual mild dysarthria.  Mild left lower face droop.  Patient lives with daughters.  BP is at goal most of the time without medication, brought in daily BP log.   REVIEW OF SYSTEMS: Full 14 system review of systems performed and notable only for:  Constitutional: N/A  Cardiovascular: N/A  Ear/Nose/Throat: ringing in ears Skin: moles  Eyes: N/A  Respiratory: N/A  Gastroitestinal: N/A  Hematology/Lymphatic: easy bleeding, easy brusing Endocrine: N/A Musculoskeletal:N/A  Allergy/Immunology: N/A  Neurological: memory loss? Confusion? Unchanged. Slurred speech Psychiatric: N/A   ALLERGIES: Allergies  Allergen Reactions  . Tramadol Hcl Other (See  Comments)     lethargy, nausea  . Alendronate Sodium Rash and Other (See Comments)    puffy eyes    HOME MEDICATIONS: Outpatient Prescriptions Prior to Visit  Medication Sig Dispense Refill  . amLODipine (NORVASC) 2.5 MG tablet Take 1 tablet (2.5 mg total) by mouth daily. Take 1 pill (2.5mg ) if SBP above 150, 2 pills (5mg ) if SBP above 175 or DBP above100  60 tablet  3  . aspirin-acetaminophen-caffeine (EXCEDRIN MIGRAINE) 250-250-65 MG per tablet Take 2 tablets by mouth daily as needed for pain (for migraines).      . midodrine (PROAMATINE) 5 MG tablet Take 1 tablet (5 mg total) by mouth 3 (three) times daily with meals as needed (While awake for dizziness).  90 tablet  6  . Multiple Vitamin (MULTIVITAMIN WITH MINERALS) TABS Take 1 tablet by mouth daily.      . Rivaroxaban (XARELTO) 15 MG TABS tablet Take 1 tablet (15 mg total) by mouth daily with supper.  30 tablet  3  . UNABLE TO FIND Wheeled rolling walker with seat, 5 inch wheels   Diagnosis: Acute CVA/stroke, gait instability  1 Mutually Defined  0  . Calcium-Vitamin D-Vitamin K 500-100-40 MG-UNT-MCG CHEW Chew 1 tablet by mouth 3 (three) times daily.       No facility-administered medications prior to visit.    PAST MEDICAL HISTORY: Past Medical History  Diagnosis Date  . Arthritis   . Hypertension   . Migraine   . Colon polyp   . Hearing difficulty   . Torus palatinus   .  Atrial flutter January, 2012  . Stroke 08/02/12     right lenticular nucleus infarct    PAST SURGICAL HISTORY: Past Surgical History  Procedure Laterality Date  . Cataract extraction w/ intraocular lens  implant, bilateral  2006-2008  . Ganglion cyst excision Bilateral 1938,1954,2003,2005    "wrists/hand" (08/01/2012)  . Cardioversion  05/19/2010    Dr. Jacinto Halim  . Tonsillectomy  ~ 1935  . Appendectomy  02/19/53    `    FAMILY HISTORY: Family History  Problem Relation Age of Onset  . Colon cancer    . Breast cancer    . Brain cancer    . Cancer  Mother     breast  . Aneurysm Mother     brain  . Heart attack Neg Hx   . Diabetes Neg Hx   . Hypertension Neg Hx     SOCIAL HISTORY: History   Social History  . Marital Status: Widowed    Spouse Name: N/A    Number of Children: N/A  . Years of Education: N/A   Occupational History  . 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    Social History Main Topics  . Smoking status: Former Games developer  . Smokeless tobacco: Never Used  . Alcohol Use: 0.0 oz/week     Comment: 08/01/2012 "glass of wine on special occasions"  . Drug Use: No  . Sexually Active: No   Other Topics Concern  . Not on file   Social History Narrative  . No narrative on file     PHYSICAL EXAM  Filed Vitals:   10/12/12 1327  BP: 173/103  Pulse: 61  Temp: 98.1 F (36.7 C)  TempSrc: Oral  Height: 5\' 6"  (1.676 m)  Weight: 122 lb (55.339 kg)   Body mass index is 19.7 kg/(m^2).  Generalized: In no acute distress, pleasant elderly Caucasian female  Neck: Supple, no carotid bruits   Cardiac: Irregular rate rhythm, no murmur   Pulmonary: Clear to auscultation bilaterally   Musculoskeletal: No deformity   Neurological examination   Mentation: Alert oriented to time, place, history taking, language fluent, and causual conversation  Cranial nerve II-XII: Pupils were equal round reactive to light extraocular movements were full, visual field were full on confrontational test. facial sensation and strength were normal. Mild left lower face asymmetry. hearing was intact to finger rubbing bilaterally. Uvula tongue midline. head turning and shoulder shrug and were normal and symmetric.Tongue protrusion into cheek strength was normal. MOTOR: normal bulk and tone, full strength in the BLE, Mild diminished fine finger movements on left. Orbits right over left upper extremity. Mild left grip weak.  no pronator drift SENSORY: normal and symmetric to light touch, pinprick, temperature COORDINATION: finger-nose-finger,  heel-to-shin bilaterally, there was no truncal ataxia REFLEXES: Brachioradialis 2/2, biceps 2/2, triceps 2/2, patellar 2/2, Achilles 2/2, plantar responses were flexor bilaterally. GAIT/STATION: Rising up from seated position without assistance, normal stance, without trunk ataxia, moderate stride, good arm swing, smooth turning, able to perform tiptoe, and heel walking without difficulty.   DIAGNOSTIC DATA (LABS, IMAGING, TESTING) - I reviewed patient records, labs, notes, testing and imaging myself where available.  Lab Results  Component Value Date   WBC 6.2 08/01/2012   HGB 14.7 08/01/2012   HCT 43.8 08/01/2012   MCV 90.1 08/01/2012   PLT 165 08/01/2012      Component Value Date/Time   NA 144 08/01/2012 1320   K 4.1 08/01/2012 1320   CL 106 08/01/2012 1320   CO2 29 08/01/2012  1320   GLUCOSE 154* 08/01/2012 1320   BUN 16 08/01/2012 1320   CREATININE 1.00 08/01/2012 1320   CALCIUM 9.4 08/01/2012 1320   PROT 7.0 08/01/2012 1320   ALBUMIN 4.0 08/01/2012 1320   AST 24 08/01/2012 1320   ALT 11 08/01/2012 1320   ALKPHOS 69 08/01/2012 1320   BILITOT 0.5 08/01/2012 1320   GFRNONAA 50* 08/01/2012 1320   GFRAA 58* 08/01/2012 1320   Lab Results  Component Value Date   CHOL 161 08/02/2012   HDL 65 08/02/2012   LDLCALC 80 08/02/2012   TRIG 78 08/02/2012   CHOLHDL 2.5 08/02/2012   Lab Results  Component Value Date   HGBA1C 5.6 08/01/2012   CT of the brain 08/01/2012 No intracranial hemorrhage. Prominent small vessel disease type changes without CT evidence of large acute infarct.  MRI of the brain 08/02/2012 Acute non hemorrhagic infarct extends from the lateral aspect of the right lenticular nucleus superiorly into the mid aspect of the right corona radiata.  MRA of the brain 08/02/2012 Intracranial atherosclerotic type changes as detailed above. Small bulge superior margin P1 segment left posterior cerebral artery may represent a tiny (1.1 mm) aneurysm versus result of atherosclerotic type changes.  2D Echocardiogram 08/02/12 60% .  Normal wall motion,  Carotid Doppler 08/02/12 No significant extracranial carotid artery stenosis demonstrated. Vertebrals are patent with antegrade flow. EKG atrial flutter, rate 79.   ASSESSMENT AND PLAN [name] is a 79 y.o. female presenting with difficulty speaking. Imaging confirms a right lenticular nucleus infarct. Infarct felt to be thrombotic secondary to small vessel disease.  Stroke not likely related to atrial flutter. Patient with resultant mild dysarthria.   PLAN: 1. Continue xarelto for secondary stroke prevention.  Maintain strict control of hypertension with blood pressure goal below 130/90, and lipids with LDL cholesterol goal below 100 mg/dL.   Followup in the future with me in 3 months.  LYNN LAM NP-C 10/12/2012, 2:09 PM  [facility] [address] [place], [place] [address] [phone]

## 2012-11-14 ENCOUNTER — Ambulatory Visit (INDEPENDENT_AMBULATORY_CARE_PROVIDER_SITE_OTHER): Payer: Medicare Other | Admitting: Family

## 2012-11-14 ENCOUNTER — Encounter: Payer: Self-pay | Admitting: Family

## 2012-11-14 VITALS — BP 102/74 | HR 80 | Temp 97.5°F | Resp 18 | Ht 67.0 in | Wt 121.1 lb

## 2012-11-14 DIAGNOSIS — I6350 Cerebral infarction due to unspecified occlusion or stenosis of unspecified cerebral artery: Secondary | ICD-10-CM | POA: Diagnosis not present

## 2012-11-14 DIAGNOSIS — I639 Cerebral infarction, unspecified: Secondary | ICD-10-CM

## 2012-11-14 DIAGNOSIS — I10 Essential (primary) hypertension: Secondary | ICD-10-CM | POA: Diagnosis not present

## 2012-11-14 NOTE — Progress Notes (Signed)
Subjective:    Patient ID: [name], female    DOB: [birth year], 79 y.o.   MRN: [id number]  HPI  [name] is an 79 yr old female who presents today for follow up. She was last seen following a CVA in 5/14.  She had some residual left sided deficits at that time.  She reports that since her stroke she fatigues easily.  She still has some left sided weakness which the daughter notes when she tries to help her with her daily exercises.  She declines use of cane.    She reports that she has had recent ear exam and eye exam.  She has a life alert- though she   HTN- daughter reports that her BP at home has been as high as 145/70's but generally in the 130's systolic.     Review of Systems See HPI  Past Medical History  Diagnosis Date  . Arthritis   . Hypertension   . Migraine   . Colon polyp   . Hearing difficulty   . Torus palatinus   . Atrial flutter January, 2012  . Stroke 08/02/12     right lenticular nucleus infarct    History   Social History  . Marital Status: Widowed    Spouse Name: N/A    Number of Children: N/A  . Years of Education: N/A   Occupational History  . 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    Social History Main Topics  . Smoking status: Former Games developer  . Smokeless tobacco: Never Used  . Alcohol Use: 0.0 oz/week     Comment: 08/01/2012 "glass of wine on special occasions"  . Drug Use: No  . Sexual Activity: No   Other Topics Concern  . Not on file   Social History Narrative  . No narrative on file    Past Surgical History  Procedure Laterality Date  . Cataract extraction w/ intraocular lens  implant, bilateral  2006-2008  . Ganglion cyst excision Bilateral 1938,1954,2003,2005    "wrists/hand" (08/01/2012)  . Cardioversion  05/19/2010    Dr. Jacinto Halim  . Tonsillectomy  ~ 1935  . Appendectomy  02/19/53    `    Family History  Problem Relation Age of Onset  . Colon cancer    . Breast cancer    . Brain cancer    . Cancer Mother     breast  .  Aneurysm Mother     brain  . Heart attack Neg Hx   . Diabetes Neg Hx   . Hypertension Neg Hx     Allergies  Allergen Reactions  . Tramadol Hcl Other (See Comments)     lethargy, nausea  . Alendronate Sodium Rash and Other (See Comments)    puffy eyes    Current Outpatient Prescriptions on File Prior to Visit  Medication Sig Dispense Refill  . amLODipine (NORVASC) 2.5 MG tablet Take 1 tablet (2.5 mg total) by mouth daily. Take 1 pill (2.5mg ) if SBP above 150, 2 pills (5mg ) if SBP above 175 or DBP above100  60 tablet  3  . aspirin-acetaminophen-caffeine (EXCEDRIN MIGRAINE) 250-250-65 MG per tablet Take 2 tablets by mouth daily as needed for pain (for migraines).      . midodrine (PROAMATINE) 5 MG tablet Take 1 tablet (5 mg total) by mouth 3 (three) times daily with meals as needed (While awake for dizziness).  90 tablet  6  . Multiple Vitamin (MULTIVITAMIN WITH MINERALS) TABS Take 1 tablet by mouth daily.      Marland Kitchen  Rivaroxaban (XARELTO) 15 MG TABS tablet Take 1 tablet (15 mg total) by mouth daily with supper.  30 tablet  3  . UNABLE TO FIND Wheeled rolling walker with seat, 5 inch wheels   Diagnosis: Acute CVA/stroke, gait instability  1 Mutually Defined  0   No current facility-administered medications on file prior to visit.    BP 102/74  Pulse 80  Temp(Src) 97.5 F (36.4 C) (Oral)  Resp 18  Ht 5\' 7"  (1.702 m)  Wt 121 lb 1.3 oz (54.922 kg)  BMI 18.96 kg/m2  SpO2 97%       Objective:   Physical Exam  Constitutional: She is oriented to person, place, and time. She appears well-developed and well-nourished. No distress.  HENT:  Head: Normocephalic and atraumatic.  Cardiovascular: Normal rate and regular rhythm.   No murmur heard. Pulmonary/Chest: Effort normal and breath sounds normal. No respiratory distress. She has no wheezes. She has no rales. She exhibits no tenderness.  Musculoskeletal:  Very mild LUE > LLE weakness noted on exam  Neurological: She is alert and  oriented to person, place, and time.  Psychiatric: She has a normal mood and affect. Her behavior is normal. Judgment and thought content normal.          Assessment & Plan:

## 2012-11-14 NOTE — Assessment & Plan Note (Signed)
BP is stable on amlodipine.  Continue same.  

## 2012-11-14 NOTE — Patient Instructions (Addendum)
Please follow up in 4 months

## 2012-11-14 NOTE — Assessment & Plan Note (Signed)
LDL and BP are at goal.  Clinically doing well. She continues xarelto and is followed by neurology. We did discuss that she should consider use of a cane as she is at increased fall risk.  She will consider.

## 2012-11-23 DIAGNOSIS — Z8673 Personal history of transient ischemic attack (TIA), and cerebral infarction without residual deficits: Secondary | ICD-10-CM | POA: Diagnosis not present

## 2012-11-23 DIAGNOSIS — I4892 Unspecified atrial flutter: Secondary | ICD-10-CM | POA: Diagnosis not present

## 2012-11-23 DIAGNOSIS — I951 Orthostatic hypotension: Secondary | ICD-10-CM | POA: Diagnosis not present

## 2013-01-15 DIAGNOSIS — H903 Sensorineural hearing loss, bilateral: Secondary | ICD-10-CM | POA: Diagnosis not present

## 2013-01-16 ENCOUNTER — Ambulatory Visit (INDEPENDENT_AMBULATORY_CARE_PROVIDER_SITE_OTHER): Payer: Medicare Other | Admitting: Nurse Practitioner

## 2013-01-16 ENCOUNTER — Encounter: Payer: Self-pay | Admitting: Nurse Practitioner

## 2013-01-16 ENCOUNTER — Encounter (INDEPENDENT_AMBULATORY_CARE_PROVIDER_SITE_OTHER): Payer: Self-pay

## 2013-01-16 VITALS — BP 156/87 | HR 100 | Temp 97.9°F | Ht 67.0 in | Wt 123.0 lb

## 2013-01-16 DIAGNOSIS — I6330 Cerebral infarction due to thrombosis of unspecified cerebral artery: Secondary | ICD-10-CM

## 2013-01-16 DIAGNOSIS — I639 Cerebral infarction, unspecified: Secondary | ICD-10-CM

## 2013-01-16 DIAGNOSIS — I69322 Dysarthria following cerebral infarction: Secondary | ICD-10-CM

## 2013-01-16 DIAGNOSIS — I69922 Dysarthria following unspecified cerebrovascular disease: Secondary | ICD-10-CM | POA: Diagnosis not present

## 2013-01-16 NOTE — Patient Instructions (Addendum)
Continue Xarelto for atrial fibrillation and   for secondary stroke prevention and maintain strict control of hypertension with blood pressure goal below 130/90, diabetes with hemoglobin A1c goal below 6.5% and lipids with LDL cholesterol goal below 100 mg/dL.   Followup in the future with me in 6 months.  Graduated return to driving instructions were provided. It is recommended that the patient first drives with another licensed driver in an empty parking lot. If the patient does well with this, and they can drive on a quiet street with the licensed driver. If the patient does well with this they can drive on a busy street with a licensed driver. If the patient does well with this, the next time out they can go by himself. For the first month after resuming driving, I recommend NO nighttime or Interstate driving.

## 2013-01-16 NOTE — Progress Notes (Signed)
[facility]  PATIENT: [name] DOB: April 09, 1924   REASON FOR VISIT: follow up HISTORY FROM: patient  HISTORY OF PRESENT ILLNESS: [name] is an 79 y.o. female who LKW by telephone around 0730 on 08/01/2012. She went to the [facility] (included neck massage) where they felt like she was having difficulty speaking. That person did call the daughter to tell her about her mom. The patient also says that she felt that she had delay in her speech and was slower in ambulation. Once she looked in the mirror, she also noticed her facial droop. Daughter drove her to [facility] . Patient is on xarelto for afib/flutter (Dr. Jacinto Halim). CT was non-acute. Patient was called as code stroke; however, sx resolving upon transfer to [facility]. Patient has migraines of note. No complaints of neck pain or dizziness. Patient was not a TPA candidate secondary to delay in arrival. She was admitted for further evaluation and treatment.   Update 10/12/12: Patient returns to office with 2 daughters for follow up. She has completed outpatient PT, OT, ST. Left sided weakness has mostly resolved. Gait is steady, no falls at home. Residual mild dysarthria. Mild left lower face droop. Patient lives with daughters. BP is at goal most of the time without medication, brought in daily BP log.   UPDATE 01/16/13 (LL):  Ms. [name] returns to office for stroke follow up visit accompanied by her daughter.  She has done very well, has no complaints, except that she very much wants to drive again.  Her family insist on taking her where she needs to go.  She has had recent follow up with her ENT, and ordered new hearing aids, and her eye doctor, with a good report.  She is taking Xarelto daily without excessive bruising.  Her blood pressure is well controlled without BP medicine.  REVIEW OF SYSTEMS: Full 14 system review of systems performed and notable only for:  Allergy/Immunology: allergies, runny  nose.   ALLERGIES: Allergies  Allergen Reactions  . Tramadol Hcl Other (See Comments)     lethargy, nausea  . Alendronate Sodium Rash and Other (See Comments)    puffy eyes    HOME MEDICATIONS: Outpatient Prescriptions Prior to Visit  Medication Sig Dispense Refill  . amLODipine (NORVASC) 2.5 MG tablet Take 1 tablet (2.5 mg total) by mouth daily. Take 1 pill (2.5mg ) if SBP above 150, 2 pills (5mg ) if SBP above 175 or DBP above100  60 tablet  3  . aspirin-acetaminophen-caffeine (EXCEDRIN MIGRAINE) 250-250-65 MG per tablet Take 2 tablets by mouth daily as needed for pain (for migraines).      . midodrine (PROAMATINE) 5 MG tablet Take 1 tablet (5 mg total) by mouth 3 (three) times daily with meals as needed (While awake for dizziness).  90 tablet  6  . Multiple Vitamin (MULTIVITAMIN WITH MINERALS) TABS Take 1 tablet by mouth daily.      . Rivaroxaban (XARELTO) 15 MG TABS tablet Take 1 tablet (15 mg total) by mouth daily with supper.  30 tablet  3  . UNABLE TO FIND Wheeled rolling walker with seat, 5 inch wheels   Diagnosis: Acute CVA/stroke, gait instability  1 Mutually Defined  0   No facility-administered medications prior to visit.    PAST MEDICAL HISTORY: Past Medical History  Diagnosis Date  . Arthritis   . Hypertension   . Migraine   . Colon polyp   . Hearing difficulty   . Torus palatinus   .  Atrial flutter January, 2012  . Stroke 08/02/12     right lenticular nucleus infarct    PAST SURGICAL HISTORY: Past Surgical History  Procedure Laterality Date  . Cataract extraction w/ intraocular lens  implant, bilateral  2006-2008  . Ganglion cyst excision Bilateral 1938,1954,2003,2005    "wrists/hand" (08/01/2012)  . Cardioversion  05/19/2010    Dr. Jacinto Halim  . Tonsillectomy  ~ 1935  . Appendectomy  02/19/53    `    FAMILY HISTORY: Family History  Problem Relation Age of Onset  . Colon cancer    . Breast cancer    . Brain cancer    . Cancer Mother     breast  .  Aneurysm Mother     brain  . Heart attack Neg Hx   . Diabetes Neg Hx   . Hypertension Neg Hx     SOCIAL HISTORY: History   Social History  . Marital Status: Widowed    Spouse Name: N/A    Number of Children: 3  . Years of Education: college   Occupational History  . 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    Social History Main Topics  . Smoking status: Former Games developer  . Smokeless tobacco: Never Used  . Alcohol Use: 0.0 oz/week     Comment: 08/01/2012 "glass of wine on special occasions"  . Drug Use: No  . Sexual Activity: No   Other Topics Concern  . Not on file   Social History Narrative  . No narrative on file     PHYSICAL EXAM  Filed Vitals:   01/16/13 1445  BP: 156/87  Pulse: 100  Temp: 97.9 F (36.6 C)  TempSrc: Oral  Height: 5\' 7"  (1.702 m)  Weight: 123 lb (55.792 kg)   Body mass index is 19.26 kg/(m^2).  Generalized: In no acute distress, pleasant elderly Caucasian female  Neck: Supple, no carotid bruits  Cardiac: Irregular rate rhythm, no murmur  Pulmonary: Clear to auscultation bilaterally  Musculoskeletal: No deformity   Neurological examination  Mentation: Alert oriented to time, place, history taking, language fluent, and casual conversation  Cranial nerve II-XII: Pupils were equal round reactive to light extraocular movements were full, visual field were full on confrontational test. facial sensation and strength were normal. Mild left lower face asymmetry. hearing was intact to finger rubbing bilaterally. Uvula tongue midline. head turning and shoulder shrug and were normal and symmetric.Tongue protrusion into cheek strength was normal.  MOTOR: normal bulk and tone, full strength in the BLE, Mild diminished fine finger movements on left. Orbits right over left upper extremity. Mild left grip weak. no pronator drift  SENSORY: normal and symmetric to light touch, pinprick, temperature  COORDINATION: finger-nose-finger, heel-to-shin bilaterally, there  was no truncal ataxia  REFLEXES: Brachioradialis 2/2, biceps 2/2, triceps 2/2, patellar 2/2, Achilles 2/2, plantar responses were flexor bilaterally.  GAIT/STATION: Rising up from seated position without assistance, normal stance, without trunk ataxia, moderate stride, good arm swing, smooth turning, able to perform tiptoe, and heel walking without difficulty.  DIAGNOSTIC DATA (LABS, IMAGING, TESTING) - I reviewed patient records, labs, notes, testing and imaging myself where available.  Lab Results  Component Value Date   WBC 6.2 08/01/2012   HGB 14.7 08/01/2012   HCT 43.8 08/01/2012   MCV 90.1 08/01/2012   PLT 165 08/01/2012      Component Value Date/Time   NA 144 08/01/2012 1320   K 4.1 08/01/2012 1320   CL 106 08/01/2012 1320   CO2 29 08/01/2012 1320  GLUCOSE 154* 08/01/2012 1320   BUN 16 08/01/2012 1320   CREATININE 1.00 08/01/2012 1320   CALCIUM 9.4 08/01/2012 1320   PROT 7.0 08/01/2012 1320   ALBUMIN 4.0 08/01/2012 1320   AST 24 08/01/2012 1320   ALT 11 08/01/2012 1320   ALKPHOS 69 08/01/2012 1320   BILITOT 0.5 08/01/2012 1320   GFRNONAA 50* 08/01/2012 1320   GFRAA 58* 08/01/2012 1320   Lab Results  Component Value Date   CHOL 161 08/02/2012   HDL 65 08/02/2012   LDLCALC 80 08/02/2012   TRIG 78 08/02/2012   CHOLHDL 2.5 08/02/2012   Lab Results  Component Value Date   HGBA1C 5.6 08/01/2012   No results found for this basename: VITAMINB12   Lab Results  Component Value Date   TSH 1.038 10/05/2010    CT of the brain 08/01/2012 No intracranial hemorrhage. Prominent small vessel disease type changes without CT evidence of large acute infarct.  MRI of the brain 08/02/2012 Acute non hemorrhagic infarct extends from the lateral aspect of the right lenticular nucleus superiorly into the mid aspect of the right corona radiata.  MRA of the brain 08/02/2012 Intracranial atherosclerotic type changes as detailed above. Small bulge superior margin P1 segment left posterior cerebral artery may represent a tiny (1.1 mm) aneurysm  versus result of atherosclerotic type changes.  2D Echocardiogram 08/02/12 60% . Normal wall motion,  Carotid Doppler 08/02/12 No significant extracranial carotid artery stenosis demonstrated. Vertebrals are patent with antegrade flow.    ASSESSMENT AND PLAN Ms. RYLYNN KOBS is a 79 y.o. female presenting with difficulty speaking. Imaging confirms a right lenticular nucleus infarct. Infarct felt to be thrombotic secondary to small vessel disease. Stroke not likely related to atrial flutter. Patient with resultant mild dysarthria.   PLAN:  1. Continue xarelto for secondary stroke prevention. Maintain strict control of hypertension with blood pressure goal below 130/90, and lipids with LDL cholesterol goal below 100 mg/dL.  2. Graduated return to driving instructions were provided. It is recommended that the patient first drives with another licensed driver in an empty parking lot. If the patient does well with this, and they can drive on a quiet street with the licensed driver. If the patient does well with this they can drive on a busy street with a licensed driver. If the patient does well with this, the next time out they can go by himself. For the first month after resuming driving, I recommend no nighttime or Interstate driving.   Followup in the future with me in 6 months.  Ronal Fear, MSN, NP-C 01/16/2013, 2:54 PM [facility] [address], [address] [place], [place] [address] [phone]

## 2013-01-26 ENCOUNTER — Ambulatory Visit (INDEPENDENT_AMBULATORY_CARE_PROVIDER_SITE_OTHER): Payer: Medicare Other

## 2013-01-26 DIAGNOSIS — Z23 Encounter for immunization: Secondary | ICD-10-CM | POA: Diagnosis not present

## 2013-02-05 ENCOUNTER — Emergency Department (HOSPITAL_BASED_OUTPATIENT_CLINIC_OR_DEPARTMENT_OTHER): Payer: Medicare Other

## 2013-02-05 ENCOUNTER — Inpatient Hospital Stay (HOSPITAL_COMMUNITY): Payer: Medicare Other

## 2013-02-05 ENCOUNTER — Inpatient Hospital Stay (HOSPITAL_BASED_OUTPATIENT_CLINIC_OR_DEPARTMENT_OTHER)
Admission: EM | Admit: 2013-02-05 | Discharge: 2013-02-07 | DRG: 066 | Disposition: A | Discharge status: Home / Self Care | Payer: Medicare Other | Attending: Internal Medicine | Admitting: Internal Medicine

## 2013-02-05 ENCOUNTER — Encounter (HOSPITAL_BASED_OUTPATIENT_CLINIC_OR_DEPARTMENT_OTHER): Payer: Self-pay | Admitting: Emergency Medicine

## 2013-02-05 DIAGNOSIS — I480 Paroxysmal atrial fibrillation: Secondary | ICD-10-CM

## 2013-02-05 DIAGNOSIS — I4891 Unspecified atrial fibrillation: Secondary | ICD-10-CM

## 2013-02-05 DIAGNOSIS — Z79899 Other long term (current) drug therapy: Secondary | ICD-10-CM | POA: Diagnosis not present

## 2013-02-05 DIAGNOSIS — Z7901 Long term (current) use of anticoagulants: Secondary | ICD-10-CM

## 2013-02-05 DIAGNOSIS — Z87891 Personal history of nicotine dependence: Secondary | ICD-10-CM | POA: Diagnosis not present

## 2013-02-05 DIAGNOSIS — Z9849 Cataract extraction status, unspecified eye: Secondary | ICD-10-CM

## 2013-02-05 DIAGNOSIS — M79609 Pain in unspecified limb: Secondary | ICD-10-CM | POA: Diagnosis not present

## 2013-02-05 DIAGNOSIS — Z961 Presence of intraocular lens: Secondary | ICD-10-CM

## 2013-02-05 DIAGNOSIS — Z803 Family history of malignant neoplasm of breast: Secondary | ICD-10-CM | POA: Diagnosis not present

## 2013-02-05 DIAGNOSIS — R51 Headache: Secondary | ICD-10-CM | POA: Diagnosis not present

## 2013-02-05 DIAGNOSIS — I4892 Unspecified atrial flutter: Secondary | ICD-10-CM

## 2013-02-05 DIAGNOSIS — M899 Disorder of bone, unspecified: Secondary | ICD-10-CM

## 2013-02-05 DIAGNOSIS — E785 Hyperlipidemia, unspecified: Secondary | ICD-10-CM | POA: Diagnosis present

## 2013-02-05 DIAGNOSIS — G43909 Migraine, unspecified, not intractable, without status migrainosus: Secondary | ICD-10-CM | POA: Diagnosis present

## 2013-02-05 DIAGNOSIS — I951 Orthostatic hypotension: Secondary | ICD-10-CM

## 2013-02-05 DIAGNOSIS — H269 Unspecified cataract: Secondary | ICD-10-CM

## 2013-02-05 DIAGNOSIS — Z8673 Personal history of transient ischemic attack (TIA), and cerebral infarction without residual deficits: Secondary | ICD-10-CM | POA: Diagnosis not present

## 2013-02-05 DIAGNOSIS — I10 Essential (primary) hypertension: Secondary | ICD-10-CM | POA: Diagnosis not present

## 2013-02-05 DIAGNOSIS — N201 Calculus of ureter: Secondary | ICD-10-CM

## 2013-02-05 DIAGNOSIS — I6789 Other cerebrovascular disease: Secondary | ICD-10-CM | POA: Diagnosis not present

## 2013-02-05 DIAGNOSIS — Z9089 Acquired absence of other organs: Secondary | ICD-10-CM

## 2013-02-05 DIAGNOSIS — Z7982 Long term (current) use of aspirin: Secondary | ICD-10-CM

## 2013-02-05 DIAGNOSIS — M159 Polyosteoarthritis, unspecified: Secondary | ICD-10-CM

## 2013-02-05 DIAGNOSIS — Z860100 Personal history of colon polyps, unspecified: Secondary | ICD-10-CM

## 2013-02-05 DIAGNOSIS — I639 Cerebral infarction, unspecified: Secondary | ICD-10-CM

## 2013-02-05 DIAGNOSIS — J449 Chronic obstructive pulmonary disease, unspecified: Secondary | ICD-10-CM | POA: Diagnosis not present

## 2013-02-05 DIAGNOSIS — H9190 Unspecified hearing loss, unspecified ear: Secondary | ICD-10-CM

## 2013-02-05 DIAGNOSIS — R29898 Other symptoms and signs involving the musculoskeletal system: Secondary | ICD-10-CM | POA: Diagnosis not present

## 2013-02-05 DIAGNOSIS — R55 Syncope and collapse: Secondary | ICD-10-CM

## 2013-02-05 DIAGNOSIS — S0990XA Unspecified injury of head, initial encounter: Secondary | ICD-10-CM | POA: Diagnosis not present

## 2013-02-05 DIAGNOSIS — I6350 Cerebral infarction due to unspecified occlusion or stenosis of unspecified cerebral artery: Secondary | ICD-10-CM | POA: Diagnosis not present

## 2013-02-05 HISTORY — DX: Other symptoms and signs involving the musculoskeletal system: R29.898

## 2013-02-05 LAB — CBC WITH DIFFERENTIAL/PLATELET
HCT: 38.5 % (ref 36.0–46.0)
Hemoglobin: 12.7 g/dL (ref 12.0–15.0)
Lymphocytes Relative: 13 % (ref 12–46)
Lymphs Abs: 0.6 10*3/uL — ABNORMAL LOW (ref 0.7–4.0)
MCHC: 33 g/dL (ref 30.0–36.0)
Monocytes Absolute: 0.6 10*3/uL (ref 0.1–1.0)
Monocytes Relative: 12 % (ref 3–12)
Neutro Abs: 3.3 10*3/uL (ref 1.7–7.7)
Neutrophils Relative %: 72 % (ref 43–77)
RBC: 4.24 MIL/uL (ref 3.87–5.11)
WBC: 4.5 10*3/uL (ref 4.0–10.5)

## 2013-02-05 LAB — URINE MICROSCOPIC-ADD ON

## 2013-02-05 LAB — URINALYSIS, ROUTINE W REFLEX MICROSCOPIC
Nitrite: NEGATIVE
Specific Gravity, Urine: 1.015 (ref 1.005–1.030)
Urobilinogen, UA: 0.2 mg/dL (ref 0.0–1.0)
pH: 7 (ref 5.0–8.0)

## 2013-02-05 LAB — PROTIME-INR: INR: 1.17 (ref 0.00–1.49)

## 2013-02-05 LAB — COMPREHENSIVE METABOLIC PANEL
ALT: 8 U/L (ref 0–35)
Calcium: 9 mg/dL (ref 8.4–10.5)
Creatinine, Ser: 1 mg/dL (ref 0.50–1.10)
GFR calc Af Amer: 57 mL/min — ABNORMAL LOW (ref >90)
GFR calc non Af Amer: 50 mL/min — ABNORMAL LOW (ref >90)
Glucose, Bld: 88 mg/dL (ref 70–99)
Sodium: 142 mEq/L (ref 135–145)
Total Protein: 6.5 g/dL (ref 6.0–8.3)

## 2013-02-05 MED ORDER — RIVAROXABAN 15 MG PO TABS
15.0000 mg | ORAL_TABLET | Freq: Every day | ORAL | Status: DC
  Administered 2013-02-05 – 2013-02-06 (×2): 15 mg via ORAL
  Filled 2013-02-05 (×3): qty 1

## 2013-02-05 MED ORDER — SENNOSIDES-DOCUSATE SODIUM 8.6-50 MG PO TABS: 1.0000 | ORAL_TABLET | Freq: Every evening | ORAL | Status: DC | PRN

## 2013-02-05 MED ORDER — AMLODIPINE BESYLATE 2.5 MG PO TABS
2.5000 mg | ORAL_TABLET | Freq: Every day | ORAL | Status: DC
  Administered 2013-02-05 – 2013-02-06 (×2): 2.5 mg via ORAL
  Filled 2013-02-05 (×3): qty 1

## 2013-02-05 MED ORDER — ACETAMINOPHEN 325 MG PO TABS
650.0000 mg | ORAL_TABLET | Freq: Four times a day (QID) | ORAL | Status: DC | PRN
  Administered 2013-02-06 – 2013-02-07 (×2): 650 mg via ORAL
  Filled 2013-02-05 (×2): qty 2

## 2013-02-05 MED ORDER — PANTOPRAZOLE SODIUM 40 MG PO TBEC
40.0000 mg | DELAYED_RELEASE_TABLET | Freq: Every day | ORAL | Status: DC
  Administered 2013-02-05 – 2013-02-07 (×3): 40 mg via ORAL
  Filled 2013-02-05 (×3): qty 1

## 2013-02-05 MED ORDER — ATORVASTATIN CALCIUM 40 MG PO TABS
40.0000 mg | ORAL_TABLET | Freq: Every day | ORAL | Status: DC
  Administered 2013-02-05 – 2013-02-06 (×2): 40 mg via ORAL
  Filled 2013-02-05 (×3): qty 1

## 2013-02-05 NOTE — Plan of Care (Signed)
   [name], is a 80 y.o. female, DOB - Jan 31, 1925, MWN:[id number] H/O CVA, Afib on Xaralto, coming from [facility] for CVA workup, mild L sided Leg>arm, Head CT OK, no back pain, Call neuro once here.     Filed Vitals:   02/05/13 0928 02/05/13 0929 02/05/13 1330  BP: 176/77    Pulse: 66    Temp: 97.9 F (36.6 C)  98.3 F (36.8 C)  TempSrc: Oral    Resp: 18    Height:  5\' 7"  (1.702 m)   Weight:  54.885 kg (121 lb)   SpO2: 99%          Data Review   Micro Results No results found for this or any previous visit (from the past 240 hour(s)).  Radiology Reports Ct Head Wo Contrast  02/05/2013   CLINICAL DATA:  Head pain post fall.  EXAM: CT HEAD WITHOUT CONTRAST  TECHNIQUE: Contiguous axial images were obtained from the base of the skull through the vertex without intravenous contrast.  COMPARISON:  08/01/2012 MR and CT.  FINDINGS: No skull fracture or intracranial hemorrhage.  Remote infarct right external capsule extending into the right coronal radiata.  Prominent small vessel disease type changes. No CT evidence of large acute infarct.  Global atrophy.  No intracranial mass lesion noted on this unenhanced exam.  IMPRESSION: No skull fracture or intracranial hemorrhage.  Remote infarct right external capsule extending into the right coronal radiata.  Prominent small vessel disease type changes. No CT evidence of large acute infarct.  Global atrophy   Electronically Signed   By: Bridgett Larsson M.D.   On: 02/05/2013 10:41    CBC  Recent Labs Lab 02/05/13 1035  WBC 4.5  HGB 12.7  HCT 38.5  PLT 174  MCV 90.8  MCH 30.0  MCHC 33.0  RDW 13.3  LYMPHSABS 0.6*  MONOABS 0.6  EOSABS 0.1  BASOSABS 0.0    Chemistries   Recent Labs Lab 02/05/13 1035  NA 142  K 4.2  CL 107  CO2 29  GLUCOSE 88  BUN 17  CREATININE 1.00  CALCIUM 9.0  AST 20  ALT 8  ALKPHOS 65  BILITOT 0.4    ------------------------------------------------------------------------------------------------------------------ estimated creatinine clearance is 35 ml/min (by C-G formula based on Cr of 1). ------------------------------------------------------------------------------------------------------------------ No results found for this basename: HGBA1C,  in the last 72 hours ------------------------------------------------------------------------------------------------------------------ No results found for this basename: CHOL, HDL, LDLCALC, TRIG, CHOLHDL, LDLDIRECT,  in the last 72 hours ------------------------------------------------------------------------------------------------------------------ No results found for this basename: TSH, T4TOTAL, FREET3, T3FREE, THYROIDAB,  in the last 72 hours ------------------------------------------------------------------------------------------------------------------ No results found for this basename: VITAMINB12, FOLATE, FERRITIN, TIBC, IRON, RETICCTPCT,  in the last 72 hours  Coagulation profile  Recent Labs Lab 02/05/13 1035  INR 1.17    No results found for this basename: DDIMER,  in the last 72 hours  Cardiac Enzymes No results found for this basename: CK, CKMB, TROPONINI, MYOGLOBIN,  in the last 168 hours ------------------------------------------------------------------------------------------------------------------ No components found with this basename: POCBNP,

## 2013-02-05 NOTE — ED Notes (Signed)
Labs redrawn from iv site per lab request, labs originally drawn by Mechele Collin, Charity fundraiser and paramedic student.

## 2013-02-05 NOTE — H&P (Signed)
[facility] History and Physical  [name] ZOX:[id number] DOB: January 19, 1925 DOA: 02/05/2013  Referring physician:  PCP: Lemont Fillers., NP  Specialists:   Chief Complaint: Left leg weakness  HPI: [name] is a 79 y.o. female with a past medical history of CVA in May of 2014 affecting lateral aspect of right lenticular nucleus superiorly into mid aspect of the right corona radiata based on MRI of the brain on 08/01/2012. Patient at that time had presented with left-sided weakness as well as facial droop and slurred speech. Patient at this point had been on anticoagulation with Xarelto as if she has a history of atrial flutter/fib. She reports being in her usual state health, got up this morning was walking across her kitchen when she developed sudden onset left lower extremity weakness, causing her to fall forward on her knees. She was taken to [facility] where initial CT scan of brain  revealed remote infarct however no acute intracranial abnormalities were noted. Patient was transferred to [facility] for further evaluation. She reports improvement to her left lower extremity during my catheter however states that she is not back to baseline. She denies headaches, visual changes, slurred speech, facial droop, or other focal neurological deficits. Issue reports being compliant to all of her medications including Xarelto. I discussed case with Dr. Thad Ranger of neurology who recommended continuing anticoagulation for now.                                                 Review of Systems: The patient denies anorexia, fever, weight loss,, vision loss, decreased hearing, hoarseness, chest pain, syncope, dyspnea on exertion, peripheral edema, balance deficits, hemoptysis, abdominal pain, melena, hematochezia, severe indigestion/heartburn, hematuria, incontinence, genital sores, muscle weakness, suspicious skin lesions, transient blindness, depression, unusual weight change,  abnormal bleeding, enlarged lymph nodes, angioedema, and breast masses.    Past Medical History  Diagnosis Date  . Arthritis   . Hypertension   . Migraine   . Colon polyp   . Hearing difficulty   . Torus palatinus   . Atrial flutter January, 2012  . Stroke 08/02/12     right lenticular nucleus infarct   Past Surgical History  Procedure Laterality Date  . Cataract extraction w/ intraocular lens  implant, bilateral  2006-2008  . Ganglion cyst excision Bilateral 1938,1954,2003,2005    "wrists/hand" (08/01/2012)  . Cardioversion  05/19/2010    Dr. Jacinto Halim  . Tonsillectomy  ~ 1935  . Appendectomy  02/19/53    `   Social History:  reports that she has quit smoking. She has never used smokeless tobacco. She reports that she drinks alcohol. She reports that she does not use illicit drugs. Patient currently resides with her daughters  Allergies  Allergen Reactions  . Tramadol Hcl Other (See Comments)     lethargy, nausea  . Alendronate Sodium Rash and Other (See Comments)    puffy eyes    Family History  Problem Relation Age of Onset  . Colon cancer    . Breast cancer    . Brain cancer    . Cancer Mother     breast  . Aneurysm Mother     brain  . Heart attack Neg Hx   . Diabetes Neg Hx   . Hypertension Neg Hx     Prior to Admission medications  Medication Sig Start Date End Date Taking? Authorizing Provider  amLODipine (NORVASC) 2.5 MG tablet Take 1 tablet (2.5 mg total) by mouth daily. Take 1 pill (2.5mg ) if SBP above 150, 2 pills (5mg ) if SBP above 175 or DBP above100 08/03/12  Yes Ripudeep Jenna Luo, MD  aspirin-acetaminophen-caffeine (EXCEDRIN MIGRAINE) [phone] MG per tablet Take 2 tablets by mouth daily as needed for pain (for migraines).   Yes Historical Provider, MD  midodrine (PROAMATINE) 5 MG tablet Take 1 tablet (5 mg total) by mouth 3 (three) times daily with meals as needed (While awake for dizziness). 06/02/12  Yes Pamella Pert, MD  Multiple Vitamin (MULTIVITAMIN  WITH MINERALS) TABS Take 1 tablet by mouth daily.   Yes Historical Provider, MD  Rivaroxaban (XARELTO) 15 MG TABS tablet Take 1 tablet (15 mg total) by mouth daily with supper. 08/03/12  Yes Ripudeep Jenna Luo, MD   Physical Exam: Filed Vitals:   02/05/13 1820  BP: 185/90  Pulse: 72  Temp: 98.2 F (36.8 C)  Resp: 18     General:  Well-nourished well-developed, in no acute distress, awake alert and oriented x3  Eyes: Pupils were equal round reactive to light extraocular movement was intact no sclera icterus  Neck: Neck is supple symmetrical no jugular venous distention or carotid bruit  Cardiovascular: Regular rate rhythm normal S1-S2 no murmurs rubs or gallops, no extremity edema noted  Respiratory: Lungs are clear to auscultation bilaterally no wheezing rhonchi or rales, normal respiratory  Abdomen: Abdomen is soft nontender nondistended positive bowel sounds in all 4 quadrants  Skin: No suspicious lesions or rashes noted  Musculoskeletal: Present range of motion to all extremities no edema  Psychiatric: Awake alert oriented x3  Neurologic: She does not have slurred speech or facial droop, no tongue deviation, neck is supple, 5 of 5 muscle strength to bilateral upper extremities, 5 of 5 muscle strength to right lower extremity, with 3 of 5 muscle strength to her left lower extremity. There is no alteration to sensation, no cerebellar findings noted.  Labs on Admission:  Basic Metabolic Panel:  Recent Labs Lab 02/05/13 1035  NA 142  K 4.2  CL 107  CO2 29  GLUCOSE 88  BUN 17  CREATININE 1.00  CALCIUM 9.0   Liver Function Tests:  Recent Labs Lab 02/05/13 1035  AST 20  ALT 8  ALKPHOS 65  BILITOT 0.4  PROT 6.5  ALBUMIN 3.3*   No results found for this basename: LIPASE, AMYLASE,  in the last 168 hours No results found for this basename: AMMONIA,  in the last 168 hours CBC:  Recent Labs Lab 02/05/13 1035  WBC 4.5  NEUTROABS 3.3  HGB 12.7  HCT 38.5  MCV 90.8   PLT 174   Cardiac Enzymes: No results found for this basename: CKTOTAL, CKMB, CKMBINDEX, TROPONINI,  in the last 168 hours  BNP (last 3 results) No results found for this basename: PROBNP,  in the last 8760 hours CBG: No results found for this basename: GLUCAP,  in the last 168 hours  Radiological Exams on Admission: Ct Head Wo Contrast  02/05/2013   CLINICAL DATA:  Head pain post fall.  EXAM: CT HEAD WITHOUT CONTRAST  TECHNIQUE: Contiguous axial images were obtained from the base of the skull through the vertex without intravenous contrast.  COMPARISON:  08/01/2012 MR and CT.  FINDINGS: No skull fracture or intracranial hemorrhage.  Remote infarct right external capsule extending into the right coronal radiata.  Prominent small vessel  disease type changes. No CT evidence of large acute infarct.  Global atrophy.  No intracranial mass lesion noted on this unenhanced exam.  IMPRESSION: No skull fracture or intracranial hemorrhage.  Remote infarct right external capsule extending into the right coronal radiata.  Prominent small vessel disease type changes. No CT evidence of large acute infarct.  Global atrophy   Electronically Signed   By: Bridgett Larsson M.D.   On: 02/05/2013 10:41    EKG: Independently reviewed. EKG showing atrial for ablation with ventricular rates in the 70's  Assessment/Plan Active Problems:   HTN (hypertension)   CVA (cerebral vascular accident)   Left leg weakness   Atrial fibrillation   Chronic anticoagulation   1. Acute CVA. Patient findings concerning for acute CVA. She has a history of CVA which involved left extremities. Initial CT scan of brain showed no acute findings, will place patient on CVA protocol, obtain MRI MRA of brain, carotid Dopplers and transthoracic echocardiogram. Consult physical therapy, occupational therapy and speech pathology. Case was discussed with Dr. Thad Ranger of neurology, she recommended continuing Xarelto therapy for now, will await  further recommendations. Will allow for permissive hypertension to favor cerebral perfusion. Check a fasting lipid panel, start statin therapy. 2. Atrial fibrillation. Currently rate controlled, she is not on nodal agents. Will continue anticoagulation.  3. Hypertension. Patient presented with systolic blood pressures in the 150s. Will continue low-dose Norvasc at 2.5 mg by mouth daily 4. Dyslipidemia. Followup on a lipid panel, start statin therapy.  5. Nutrition. Bedside swallow screen, heart healthy diet 6. Fluids. Normal saline at 75 mL per hour 7. DVT prophylaxis. Patient anticoagulated    Code Status: Full code Family Communication: Plan discussed with daughters present at bedside Disposition Plan: I anticipate patient will require greater than 2 night hospitalization  Time spent: 70 minutes  Jeralyn Bennett [facility] Pager [phone]  If 7PM-7AM, please contact night-coverage [email or web address] Password [email or web address] 02/05/2013, 7:35 PM

## 2013-02-05 NOTE — ED Notes (Signed)
Report to [facility], state 20 min eta. Pt and family updated.

## 2013-02-05 NOTE — ED Notes (Signed)
Melissa, [facility] is calling [facility] to find out their eta to transport pt. [facility] states that the next available truck will be en route to pick up pt. Pt and daughters updated on plan of care, offered refreshments and comforts.

## 2013-02-05 NOTE — ED Notes (Signed)
Pt daughter approaches this rn at nurse's station, states "do you know how much longer it will be?" daughter informed that pt has bed assigned, but we are awaiting [facility] to transport pt to [facility]. Daughter states "this is ridiculous! Why can't we take her in our car?" explained to daughter that pt needs to maintain her iv access, and be monitored by medical staff during transport. Pt and daughters offered refreshments and comforts.

## 2013-02-05 NOTE — ED Provider Notes (Signed)
CSN: [id number]     Arrival date & time 02/05/13  0906 History   First MD Initiated Contact with Patient 02/05/13 [phone]     Chief Complaint  Patient presents with  . Fall   (Consider location/radiation/quality/duration/timing/severity/associated sxs/prior Treatment) Patient is a 79 y.o. female presenting with fall and Acute Neurological Problem.  Fall Pertinent negatives include no abdominal pain, anorexia, arthralgias, chest pain, chills, congestion, diaphoresis, fatigue, fever, headaches, joint swelling, myalgias, nausea, neck pain, numbness, rash, sore throat, swollen glands or urinary symptoms.  Cerebrovascular Accident This is a new problem. The current episode started today. The problem occurs constantly. The problem has been unchanged. Pertinent negatives include no abdominal pain, anorexia, arthralgias, chest pain, chills, congestion, diaphoresis, fatigue, fever, headaches, joint swelling, myalgias, nausea, neck pain, numbness, rash, sore throat, swollen glands or urinary symptoms.   The patient was in her normal state of health when she had a fall this morning. She developed acute left lower extremity weakness that led to her fall. She denies hitting her head or hip. She denies back pain. She denies LOC.  Past Medical History  Diagnosis Date  . Arthritis   . Hypertension   . Migraine   . Colon polyp   . Hearing difficulty   . Torus palatinus   . Atrial flutter January, 2012  . Stroke 08/02/12     right lenticular nucleus infarct   Past Surgical History  Procedure Laterality Date  . Cataract extraction w/ intraocular lens  implant, bilateral  2006-2008  . Ganglion cyst excision Bilateral 1938,1954,2003,2005    "wrists/hand" (08/01/2012)  . Cardioversion  05/19/2010    Dr. Jacinto Halim  . Tonsillectomy  ~ 1935  . Appendectomy  02/19/53    `   Family History  Problem Relation Age of Onset  . Colon cancer    . Breast cancer    . Brain cancer    . Cancer Mother     breast  .  Aneurysm Mother     brain  . Heart attack Neg Hx   . Diabetes Neg Hx   . Hypertension Neg Hx    History  Substance Use Topics  . Smoking status: Former Games developer  . Smokeless tobacco: Never Used  . Alcohol Use: 0.0 oz/week     Comment: 08/01/2012 "glass of wine on special occasions"   OB History   Grav Para Term Preterm Abortions TAB SAB Ect Mult Living                 Review of Systems  Constitutional: Negative for fever, chills, diaphoresis and fatigue.  HENT: Negative for congestion and sore throat.   Cardiovascular: Negative for chest pain.  Gastrointestinal: Negative for nausea, abdominal pain and anorexia.  Musculoskeletal: Negative for arthralgias, joint swelling, myalgias and neck pain.  Skin: Negative for rash.  Neurological: Negative for numbness and headaches.    Allergies  Tramadol hcl and Alendronate sodium  Home Medications   Current Outpatient Rx  Name  Route  Sig  Dispense  Refill  . amLODipine (NORVASC) 2.5 MG tablet   Oral   Take 1 tablet (2.5 mg total) by mouth daily. Take 1 pill (2.5mg ) if SBP above 150, 2 pills (5mg ) if SBP above 175 or DBP above100   60 tablet   3   . aspirin-acetaminophen-caffeine (EXCEDRIN MIGRAINE) 250-250-65 MG per tablet   Oral   Take 2 tablets by mouth daily as needed for pain (for migraines).         Marland Kitchen  midodrine (PROAMATINE) 5 MG tablet   Oral   Take 1 tablet (5 mg total) by mouth 3 (three) times daily with meals as needed (While awake for dizziness).   90 tablet   6   . Multiple Vitamin (MULTIVITAMIN WITH MINERALS) TABS   Oral   Take 1 tablet by mouth daily.         . Rivaroxaban (XARELTO) 15 MG TABS tablet   Oral   Take 1 tablet (15 mg total) by mouth daily with supper.   30 tablet   3   . UNABLE TO FIND      Wheeled rolling walker with seat, 5 inch wheels   Diagnosis: Acute CVA/stroke, gait instability   1 Mutually Defined   0    BP 176/77  Pulse 66  Temp(Src) 98.3 F (36.8 C) (Oral)  Resp 18   Ht 5\' 7"  (1.702 m)  Wt 121 lb (54.885 kg)  BMI 18.95 kg/m2  SpO2 99% Physical Exam  Constitutional: She is oriented to person, place, and time. She appears well-developed and well-nourished. No distress.  HENT:  Head: Normocephalic and atraumatic.  Mouth/Throat: Oropharynx is clear and moist. No oropharyngeal exudate.  Eyes: Conjunctivae and EOM are normal. Pupils are equal, round, and reactive to light.  Neck: Normal range of motion. Neck supple.  Cardiovascular: Normal rate, normal heart sounds and intact distal pulses.  Exam reveals no gallop and no friction rub.   No murmur heard. Irregular rhythm  Pulmonary/Chest: Effort normal and breath sounds normal. No respiratory distress. She has no wheezes. She has no rales.  Abdominal: Soft. Bowel sounds are normal. She exhibits no distension. There is no tenderness. There is no rebound.  Musculoskeletal: She exhibits no edema and no tenderness.  Neurological: She is alert and oriented to person, place, and time. No cranial nerve deficit. Coordination abnormal.  Poor coordination of left hand. 4/5 motor strength in left leg. Unable to ambulate due to weakness in left leg.  Skin: She is not diaphoretic.  Psychiatric: She has a normal mood and affect. Her behavior is normal.    ED Course  Procedures (including critical care time) Labs Review Labs Reviewed  COMPREHENSIVE METABOLIC PANEL - Abnormal; Notable for the following:    Albumin 3.3 (*)    GFR calc non Af Amer 50 (*)    GFR calc Af Amer 57 (*)    All other components within normal limits  CBC WITH DIFFERENTIAL - Abnormal; Notable for the following:    Lymphs Abs 0.6 (*)    All other components within normal limits  PROTIME-INR   Imaging Review Ct Head Wo Contrast  02/05/2013   CLINICAL DATA:  Head pain post fall.  EXAM: CT HEAD WITHOUT CONTRAST  TECHNIQUE: Contiguous axial images were obtained from the base of the skull through the vertex without intravenous contrast.   COMPARISON:  08/01/2012 MR and CT.  FINDINGS: No skull fracture or intracranial hemorrhage.  Remote infarct right external capsule extending into the right coronal radiata.  Prominent small vessel disease type changes. No CT evidence of large acute infarct.  Global atrophy.  No intracranial mass lesion noted on this unenhanced exam.  IMPRESSION: No skull fracture or intracranial hemorrhage.  Remote infarct right external capsule extending into the right coronal radiata.  Prominent small vessel disease type changes. No CT evidence of large acute infarct.  Global atrophy   Electronically Signed   By: Bridgett Larsson M.D.   On: 02/05/2013 10:41  EKG Interpretation     Ventricular Rate:  73 PR Interval:    QRS Duration: 86 QT Interval:  408 QTC Calculation: 449 R Axis:   13 Text Interpretation:  Atrial fibrillation Septal infarct , age undetermined Abnormal ECG No significant change since last tracing            MDM   1. CVA (cerebral infarction)    The patients new onset left leg weakness could represent new CVA. The weakness was persistent since the patients episode this morning. She continues to have difficulty with ambulation. CT head negative for acute abnormality. CMP, CBC, PT INR unrevealing. Patient reports medication compliance with Xarelto. Spoke with neurology who recommended that the patient have an MRI head to investigate for possible stroke. [facility]. The patient and her family agree with this plan. The patient has been transferred to [facility].    Pleas Koch, MD 02/05/13 1500  Pleas Koch, MD 02/05/13 1504

## 2013-02-05 NOTE — ED Notes (Signed)
Pt to room 5 in w/c, able to amb from w/c to bed. Pt reports she has residual weakness in left leg, this am she tripped and fell. Denies any head injury, states "I overextended my toes.Marland KitchenMarland Kitchen"

## 2013-02-05 NOTE — Progress Notes (Signed)
Pt came with care link. Fully alert and orient, complaints about slight weakness and numbness on left upper & lower extremities. Put her on tele monitoring. Paged the admitting doctor twice and talked to the [facility] admissions, waiting for orders.  Danne Harbor

## 2013-02-05 NOTE — Consult Note (Signed)
Referring Physician: Catha Gosselin    Chief Complaint: LLE weakness  HPI: [name] is an 79 y.o. female who reports that she was up with her daughters today and was feeling normal.  She was walking to her daughter's bedroom and had the acute onset of weakness and numbness in her left leg.  She describes it as "going dead".  The patient fell at that time.  She was brought in for evaluation at [facility].  Patient reports she has had improvement in her symptoms since the onset earlier today but she has not returned to baseline.  At baseline the patient reports that she has some mild weakness on the left from a previous stroke.  She is usually able to ambulate independently though.  Since the episode earlier today she now requires assistance with ambulation.    Date last known well: Date: 02/05/2013 Time last known well: Time: 07:00 tPA Given: No: Patient on Xarelto and compliant  Past Medical History  Diagnosis Date  . Arthritis   . Hypertension   . Migraine   . Colon polyp   . Hearing difficulty   . Torus palatinus   . Atrial flutter January, 2012  . Stroke 08/02/12     right lenticular nucleus infarct    Past Surgical History  Procedure Laterality Date  . Cataract extraction w/ intraocular lens  implant, bilateral  2006-2008  . Ganglion cyst excision Bilateral 1938,1954,2003,2005    "wrists/hand" (08/01/2012)  . Cardioversion  05/19/2010    Dr. Jacinto Halim  . Tonsillectomy  ~ 1935  . Appendectomy  02/19/53    `    Family History  Problem Relation Age of Onset  . Colon cancer    . Breast cancer    . Brain cancer    . Cancer Mother     breast  . Aneurysm Mother     brain  . Heart attack Neg Hx   . Diabetes Neg Hx   . Hypertension Neg Hx    Social History:  reports that she has quit smoking. She has never used smokeless tobacco. She reports that she drinks alcohol. She reports that she does not use illicit drugs.  Allergies:  Allergies  Allergen Reactions  . Tramadol Hcl Other (See  Comments)     lethargy, nausea  . Alendronate Sodium Rash and Other (See Comments)    puffy eyes    Medications:  I have reviewed the patient's current medications. Prior to Admission:  Prescriptions prior to admission  Medication Sig Dispense Refill  . amLODipine (NORVASC) 2.5 MG tablet Take 1 tablet (2.5 mg total) by mouth daily. Take 1 pill (2.5mg ) if SBP above 150, 2 pills (5mg ) if SBP above 175 or DBP above100  60 tablet  3  . aspirin-acetaminophen-caffeine (EXCEDRIN MIGRAINE) 250-250-65 MG per tablet Take 2 tablets by mouth daily as needed for pain (for migraines).      . midodrine (PROAMATINE) 5 MG tablet Take 1 tablet (5 mg total) by mouth 3 (three) times daily with meals as needed (While awake for dizziness).  90 tablet  6  . Multiple Vitamin (MULTIVITAMIN WITH MINERALS) TABS Take 1 tablet by mouth daily.      . Rivaroxaban (XARELTO) 15 MG TABS tablet Take 1 tablet (15 mg total) by mouth daily with supper.  30 tablet  3   Scheduled: . amLODipine  2.5 mg Oral Daily  . atorvastatin  40 mg Oral q1800  . pantoprazole  40 mg Oral Daily  . Rivaroxaban  15 mg Oral Q supper    ROS: History obtained from the patient  General ROS: negative for - chills, fatigue, fever, night sweats, weight gain or weight loss Psychological ROS: negative for - behavioral disorder, hallucinations, memory difficulties, mood swings or suicidal ideation Ophthalmic ROS: negative for - blurry vision, double vision, eye pain or loss of vision ENT ROS: negative for - HOH Allergy and Immunology ROS: negative for - hives or itchy/watery eyes Hematological and Lymphatic ROS: negative for - bleeding problems, bruising or swollen lymph nodes Endocrine ROS: negative for - galactorrhea, hair pattern changes, polydipsia/polyuria or temperature intolerance Respiratory ROS: negative for - cough, hemoptysis, shortness of breath or wheezing Cardiovascular ROS: negative for - chest pain, dyspnea on exertion, edema or  irregular heartbeat Gastrointestinal ROS: negative for - abdominal pain, diarrhea, hematemesis, nausea/vomiting or stool incontinence Genito-Urinary ROS: negative for - dysuria, hematuria, incontinence or urinary frequency/urgency Musculoskeletal ROS: negative for - joint swelling or muscular weakness Neurological ROS: as noted in HPI Dermatological ROS: negative for rash and skin lesion changes  Physical Examination: Blood pressure 194/87, pulse 116, temperature 98.4 F (36.9 C), temperature source Oral, resp. rate 18, height 5\' 7"  (1.702 m), weight 54.885 kg (121 lb), SpO2 100.00%.  Neurologic Examination: Mental Status: Alert, oriented, thought content appropriate.  Speech fluent without evidence of aphasia.  Able to follow 3 step commands without difficulty. Cranial Nerves: II: Discs flat bilaterally; Visual fields grossly normal, pupils equal, round, reactive to light and accommodation III,IV, VI: ptosis not present, extra-ocular motions intact bilaterally V,VII: smile symmetric, facial light touch sensation normal bilaterally VIII: HOH bilaterally IX,X: gag reflex present XI: bilateral shoulder shrug XII: midline tongue extension Motor: Right : Upper extremity   5/5    Left:     Upper extremity   5/5 with 5-/5 hand grip  Lower extremity   5/5     Lower extremity   4+/5 Tone and bulk:normal tone throughout; no atrophy noted Sensory: Pinprick and light touch decreased in the left lower extremity Deep Tendon Reflexes: 2+ and symmetric with absent AJ's bilaterally Plantars: Right: downgoing   Left: downgoing Cerebellar: normal finger-to-nose and normal heel-to-shin test Gait: unable to test CV: pulses palpable throughout     Laboratory Studies:  Basic Metabolic Panel:  Recent Labs Lab 02/05/13 1035  NA 142  K 4.2  CL 107  CO2 29  GLUCOSE 88  BUN 17  CREATININE 1.00  CALCIUM 9.0    Liver Function Tests:  Recent Labs Lab 02/05/13 1035  AST 20  ALT 8   ALKPHOS 65  BILITOT 0.4  PROT 6.5  ALBUMIN 3.3*   No results found for this basename: LIPASE, AMYLASE,  in the last 168 hours No results found for this basename: AMMONIA,  in the last 168 hours  CBC:  Recent Labs Lab 02/05/13 1035  WBC 4.5  NEUTROABS 3.3  HGB 12.7  HCT 38.5  MCV 90.8  PLT 174    Cardiac Enzymes: No results found for this basename: CKTOTAL, CKMB, CKMBINDEX, TROPONINI,  in the last 168 hours  BNP: No components found with this basename: POCBNP,   CBG: No results found for this basename: GLUCAP,  in the last 168 hours  Microbiology: Results for orders placed during the hospital encounter of 09/12/10  URINE CULTURE     Status: None   Collection Time    09/12/10 11:59 PM      Result Value Range Status   Specimen Description URINE, CLEAN CATCH  Final   Special Requests NONE   Final   Culture  Setup Time [id number]   Final   Colony Count 30,000 COLONIES/ML   Final   Culture     Final   Value: Multiple bacterial morphotypes present, none predominant. Suggest appropriate recollection if clinically indicated.   Report Status 09/14/2010 FINAL   Final    Coagulation Studies:  Recent Labs  02/05/13 1035  LABPROT 14.7  INR 1.17    Urinalysis:  Recent Labs Lab 02/05/13 2126  COLORURINE YELLOW  LABSPEC 1.015  PHURINE 7.0  GLUCOSEU NEGATIVE  HGBUR TRACE*  BILIRUBINUR NEGATIVE  KETONESUR NEGATIVE  PROTEINUR NEGATIVE  UROBILINOGEN 0.2  NITRITE NEGATIVE  LEUKOCYTESUR MODERATE*    Lipid Panel:    Component Value Date/Time   CHOL 161 08/02/2012 0500   TRIG 78 08/02/2012 0500   HDL 65 08/02/2012 0500   CHOLHDL 2.5 08/02/2012 0500   VLDL 16 08/02/2012 0500   LDLCALC 80 08/02/2012 0500    HgbA1C:  Lab Results  Component Value Date   HGBA1C 5.6 08/01/2012    Urine Drug Screen:   No results found for this basename: labopia, cocainscrnur, labbenz, amphetmu, thcu, labbarb    Alcohol Level: No results found for this basename: ETH,  in the last 168  hours  Other results: EKG: atrial fibrillation, rate 73 bpm.  Imaging: Dg Chest 2 View  02/05/2013   CLINICAL DATA:  Stroke.  EXAM: CHEST  2 VIEW  COMPARISON:  07/02/2010.  FINDINGS: The heart remains normal in size. The lungs remain clear and hyperexpanded. Thoracic spine degenerative changes. Diffuse osteopenia.  IMPRESSION: Stable changes of COPD. No acute abnormality.   Electronically Signed   By: Gordan Payment M.D.   On: 02/05/2013 22:56   Ct Head Wo Contrast  02/05/2013   CLINICAL DATA:  Head pain post fall.  EXAM: CT HEAD WITHOUT CONTRAST  TECHNIQUE: Contiguous axial images were obtained from the base of the skull through the vertex without intravenous contrast.  COMPARISON:  08/01/2012 MR and CT.  FINDINGS: No skull fracture or intracranial hemorrhage.  Remote infarct right external capsule extending into the right coronal radiata.  Prominent small vessel disease type changes. No CT evidence of large acute infarct.  Global atrophy.  No intracranial mass lesion noted on this unenhanced exam.  IMPRESSION: No skull fracture or intracranial hemorrhage.  Remote infarct right external capsule extending into the right coronal radiata.  Prominent small vessel disease type changes. No CT evidence of large acute infarct.  Global atrophy   Electronically Signed   By: Bridgett Larsson M.D.   On: 02/05/2013 10:41    Assessment: 79 y.o. female with a history of atrial fibrillation and hypertension who presented after an episode of LLE weakness. Although improved patient does not feel that she is back to baseline.  Head CT has been reviewed and shows no acute changes, chronic infarct seen.  Will need further evaluation.    Stroke Risk Factors - atrial fibrillation and hypertension  Plan: 1. HgbA1c, fasting lipid panel 2. MRI, MRA  of the brain without contrast 3. PT consult, OT consult, Speech consult 4. Echocardiogram 5. Carotid dopplers 6. Prophylactic therapy-Continue Xarelto 7. Risk factor  modification 8. Telemetry monitoring 9. Frequent neuro checks  Thana Farr, MD [facility] [phone] 02/05/2013, 11:26 PM

## 2013-02-05 NOTE — ED Notes (Signed)
Pt states that she feels that her lle became "numb" and that is what caused her to fall.

## 2013-02-05 NOTE — ED Provider Notes (Addendum)
I saw and evaluated the patient, reviewed the resident's note and I agree with the findings and plan.   .Face to face Exam:  General:  Awake HEENT:  Atraumatic Resp:  Normal effort Abd:  Nondistended Neuro weakness in left lower extremity    Nelia Shi, MD 02/05/13 1517  I saw and reviewed the EKG interpretation of the resident and agree with the findings.  Nelia Shi, MD 03/02/13 2133

## 2013-02-06 ENCOUNTER — Inpatient Hospital Stay (HOSPITAL_COMMUNITY): Payer: Medicare Other

## 2013-02-06 DIAGNOSIS — I10 Essential (primary) hypertension: Secondary | ICD-10-CM | POA: Diagnosis not present

## 2013-02-06 DIAGNOSIS — I6789 Other cerebrovascular disease: Secondary | ICD-10-CM | POA: Diagnosis not present

## 2013-02-06 DIAGNOSIS — I4891 Unspecified atrial fibrillation: Secondary | ICD-10-CM | POA: Diagnosis not present

## 2013-02-06 DIAGNOSIS — I6350 Cerebral infarction due to unspecified occlusion or stenosis of unspecified cerebral artery: Secondary | ICD-10-CM | POA: Diagnosis not present

## 2013-02-06 DIAGNOSIS — Z7901 Long term (current) use of anticoagulants: Secondary | ICD-10-CM | POA: Diagnosis not present

## 2013-02-06 DIAGNOSIS — M79609 Pain in unspecified limb: Secondary | ICD-10-CM | POA: Diagnosis not present

## 2013-02-06 LAB — BASIC METABOLIC PANEL
Chloride: 105 mEq/L (ref 96–112)
GFR calc Af Amer: 73 mL/min — ABNORMAL LOW (ref >90)
GFR calc non Af Amer: 63 mL/min — ABNORMAL LOW (ref >90)
Glucose, Bld: 99 mg/dL (ref 70–99)
Potassium: 4 mEq/L (ref 3.5–5.1)
Sodium: 141 mEq/L (ref 135–145)

## 2013-02-06 LAB — URINE CULTURE

## 2013-02-06 LAB — CBC
HCT: 41.1 % (ref 36.0–46.0)
Platelets: 192 10*3/uL (ref 150–400)
RBC: 4.59 MIL/uL (ref 3.87–5.11)
WBC: 6.8 10*3/uL (ref 4.0–10.5)

## 2013-02-06 LAB — LIPID PANEL
HDL: 58 mg/dL (ref >39)
Total CHOL/HDL Ratio: 2.9 RATIO
VLDL: 18 mg/dL (ref 0–40)

## 2013-02-06 LAB — HEMOGLOBIN A1C
Hgb A1c MFr Bld: 6 % — ABNORMAL HIGH (ref <5.7)
Mean Plasma Glucose: 126 mg/dL — ABNORMAL HIGH (ref <117)

## 2013-02-06 NOTE — Progress Notes (Signed)
   CARE MANAGEMENT NOTE 02/06/2013  Patient:  [name], [name]   Account Number:  [id number]  Date Initiated:  02/06/2013  Documentation initiated by:  Jiles Crocker  Subjective/Objective Assessment:   ADMITTED WITH CVA     Action/Plan:   CM FOLLOWING FOR FOR DCP   Anticipated DC Date:  02/09/2013   Anticipated DC Plan:  POSSIBLY HOME W HOME HEALTH SERVICES, AWAITING ON PT/OT EVALS   DC Planning Services  CM consult         Status of service:  In process, will continue to follow Medicare Important Message given?  NA - LOS <3 / Initial given by admissions (If response is "NO", the following Medicare IM given date fields will be blank)  Per UR Regulation:  Reviewed for med. necessity/level of care/duration of stay  Comments:  11/11/2014Abelino Derrick RN,BSN,MHA [phone]

## 2013-02-06 NOTE — Progress Notes (Signed)
NEURO HOSPITALIST PROGRESS NOTE   SUBJECTIVE:                                                                                                                        Still complaining of left leg decreased sensation and decreased strength.   OBJECTIVE:                                                                                                                           Vital signs in last 24 hours: Temp:  [97.8 F (36.6 C)-98.9 F (37.2 C)] 97.8 F (36.6 C) (11/11 1126) Pulse Rate:  [68-116] 92 (11/11 1126) Resp:  [16-20] 20 (11/11 1126) BP: (125-212)/(56-99) 131/56 mmHg (11/11 1126) SpO2:  [95 %-100 %] 96 % (11/11 1126)  Intake/Output from previous day: 11/10 0701 - 11/11 0700 In: 220 [P.O.:220] Out: -  Intake/Output this shift:   Nutritional status: Cardiac  Past Medical History  Diagnosis Date  . Arthritis   . Hypertension   . Migraine   . Colon polyp   . Hearing difficulty   . Torus palatinus   . Atrial flutter January, 2012  . Stroke 08/02/12     right lenticular nucleus infarct     Neurologic Exam:  Mental Status: Alert, oriented, thought content appropriate.  Speech fluent without evidence of aphasia.  Able to follow 3 step commands without difficulty. Cranial Nerves: II: Visual fields grossly normal, pupils equal, round, reactive to light and accommodation III,IV, VI: ptosis not present, extra-ocular motions intact bilaterally V,VII: smile symmetric decreased left NLF at rest, facial light touch sensation normal bilaterally VIII: decreased hearing bilaterally IX,X: gag reflex present XI: bilateral shoulder shrug XII: midline tongue extension without atrophy or fasciculations  Motor: Right : Upper extremity   5/5    Left:     Upper extremity   5/5  Lower extremity   5/5     Lower extremity   4+/5 Tone and bulk:normal tone throughout; no atrophy noted Sensory: decreased sensation to light touch in left leg, more  sensation in the distal leg than proximal but no dermatomal distribution.  Deep Tendon Reflexes:  Right: Upper Extremity   Left: Upper extremity   biceps (C-5 to C-6) 2/4  biceps (C-5 to C-6) 2/4 tricep (C7) 2/4    triceps (C7) 2/4 Brachioradialis (C6) 2/4  Brachioradialis (C6) 2/4  Lower Extremity Lower Extremity  quadriceps (L-2 to L-4) 2/4   quadriceps (L-2 to L-4) 2/4 Achilles (S1) 0/4   Achilles (S1) 0/4  Plantars: Right: downgoing   Left: downgoing Cerebellar: normal finger-to-nose,  normal heel-to-shin test CV: pulses palpable throughout    Lab Results: Lab Results  Component Value Date/Time   CHOL 170 02/06/2013  5:25 AM   Lipid Panel  Recent Labs  02/06/13 0525  CHOL 170  TRIG 90  HDL 58  CHOLHDL 2.9  VLDL 18  LDLCALC 94    Studies/Results: Dg Chest 2 View  02/05/2013   CLINICAL DATA:  Stroke.  EXAM: CHEST  2 VIEW  COMPARISON:  07/02/2010.  FINDINGS: The heart remains normal in size. The lungs remain clear and hyperexpanded. Thoracic spine degenerative changes. Diffuse osteopenia.  IMPRESSION: Stable changes of COPD. No acute abnormality.   Electronically Signed   By: Gordan Payment M.D.   On: 02/05/2013 22:56   Ct Head Wo Contrast  02/05/2013   CLINICAL DATA:  Head pain post fall.  EXAM: CT HEAD WITHOUT CONTRAST  TECHNIQUE: Contiguous axial images were obtained from the base of the skull through the vertex without intravenous contrast.  COMPARISON:  08/01/2012 MR and CT.  FINDINGS: No skull fracture or intracranial hemorrhage.  Remote infarct right external capsule extending into the right coronal radiata.  Prominent small vessel disease type changes. No CT evidence of large acute infarct.  Global atrophy.  No intracranial mass lesion noted on this unenhanced exam.  IMPRESSION: No skull fracture or intracranial hemorrhage.  Remote infarct right external capsule extending into the right coronal radiata.  Prominent small vessel disease type changes. No CT evidence of  large acute infarct.  Global atrophy   Electronically Signed   By: Bridgett Larsson M.D.   On: 02/05/2013 10:41   Mr Brain Wo Contrast  02/06/2013   CLINICAL DATA:  79 year old female with left lower extremity weakness. Initial encounter.  EXAM: MRI HEAD WITHOUT CONTRAST  MRA HEAD WITHOUT CONTRAST  TECHNIQUE: Multiplanar, multiecho pulse sequences of the brain and surrounding structures were obtained without intravenous contrast. Angiographic images of the head were obtained using MRA technique without contrast.  COMPARISON:  Head CT 02/05/2013. Brain MRI and MRA 08/01/2012.  FINDINGS: MRI HEAD FINDINGS  Expected evolution of the right corona radiata -lentiform nuclei infarct which occurred in May. Cystic encephalomalacia. No restricted diffusion or evidence of acute infarction.  Major intracranial vascular flow voids are stable. Stable cerebral volume. No ventriculomegaly. No midline shift, mass effect, or evidence of intracranial mass lesion. No acute intracranial hemorrhage identified. Negative pituitary. Negative cervicomedullary junction. Stable visualized cervical spine. Confluent cerebral white matter T2 and FLAIR hyperintensity re- identified. Moderate T2 heterogeneity in the deep gray matter nuclei, in part related to perivascular spaces. Moderate T2 hyperintensity in the pons. Chronic lacunar infarcts in the right cerebellum.  Stable orbits soft tissues. Stable paranasal sinuses and mastoids. Visualized scalp soft tissues are within normal limits. Normal bone marrow signal.  MRA HEAD FINDINGS  Stable antegrade flow in the posterior circulation with codominant distal vertebral arteries. Normal left PICA. Dominant, duplicated right AICA. Patent basilar artery without stenosis. SCA and PCA origins are within normal limits. Posterior communicating artery is diminutive or absent. Mild to moderate bilateral PCA branch irregularity is stable with preserved distal flow.  Antegrade flow in both ICA siphons. Stable  ICA ectasia. No ICA stenosis. Ophthalmic artery origins are normal. Stable and normal carotid termini, MCA and ACA origins. Normal anterior communicating artery.  Stable moderate to severe left ACA A2 stenosis with preserved distal flow. Suggestion of a new right ACA callosomarginal region high-grade stenosis (series 5, image 128) with preserved distal flow.  Mild bilateral MCA irregularity, relatively sparing the M1 segments, is stable. No major MCA branch occlusion identified.  IMPRESSION: 1. No acute intracranial abnormality. Expected evolution of right hemisphere lacunar infarct since May. Underlying advanced chronic small vessel disease.  2. Suggestion of new distal right ACA hemodynamically significant stenosis, at the callosomarginal artery level, with preserved distal flow.  3. Otherwise stable intracranial MRA since 08/01/2012, with overall moderate medium-sized vessel irregularity compatible with atherosclerosis.   Electronically Signed   By: Augusto Gamble M.D.   On: 02/06/2013 09:07   Mr Maxine Glenn Head/brain Wo Cm  02/06/2013   CLINICAL DATA:  79 year old female with left lower extremity weakness. Initial encounter.  EXAM: MRI HEAD WITHOUT CONTRAST  MRA HEAD WITHOUT CONTRAST  TECHNIQUE: Multiplanar, multiecho pulse sequences of the brain and surrounding structures were obtained without intravenous contrast. Angiographic images of the head were obtained using MRA technique without contrast.  COMPARISON:  Head CT 02/05/2013. Brain MRI and MRA 08/01/2012.  FINDINGS: MRI HEAD FINDINGS  Expected evolution of the right corona radiata -lentiform nuclei infarct which occurred in May. Cystic encephalomalacia. No restricted diffusion or evidence of acute infarction.  Major intracranial vascular flow voids are stable. Stable cerebral volume. No ventriculomegaly. No midline shift, mass effect, or evidence of intracranial mass lesion. No acute intracranial hemorrhage identified. Negative pituitary. Negative cervicomedullary  junction. Stable visualized cervical spine. Confluent cerebral white matter T2 and FLAIR hyperintensity re- identified. Moderate T2 heterogeneity in the deep gray matter nuclei, in part related to perivascular spaces. Moderate T2 hyperintensity in the pons. Chronic lacunar infarcts in the right cerebellum.  Stable orbits soft tissues. Stable paranasal sinuses and mastoids. Visualized scalp soft tissues are within normal limits. Normal bone marrow signal.  MRA HEAD FINDINGS  Stable antegrade flow in the posterior circulation with codominant distal vertebral arteries. Normal left PICA. Dominant, duplicated right AICA. Patent basilar artery without stenosis. SCA and PCA origins are within normal limits. Posterior communicating artery is diminutive or absent. Mild to moderate bilateral PCA branch irregularity is stable with preserved distal flow.  Antegrade flow in both ICA siphons. Stable ICA ectasia. No ICA stenosis. Ophthalmic artery origins are normal. Stable and normal carotid termini, MCA and ACA origins. Normal anterior communicating artery.  Stable moderate to severe left ACA A2 stenosis with preserved distal flow. Suggestion of a new right ACA callosomarginal region high-grade stenosis (series 5, image 128) with preserved distal flow.  Mild bilateral MCA irregularity, relatively sparing the M1 segments, is stable. No major MCA branch occlusion identified.  IMPRESSION: 1. No acute intracranial abnormality. Expected evolution of right hemisphere lacunar infarct since May. Underlying advanced chronic small vessel disease.  2. Suggestion of new distal right ACA hemodynamically significant stenosis, at the callosomarginal artery level, with preserved distal flow.  3. Otherwise stable intracranial MRA since 08/01/2012, with overall moderate medium-sized vessel irregularity compatible with atherosclerosis.   Electronically Signed   By: Augusto Gamble M.D.   On: 02/06/2013 09:07    MEDICATIONS  Scheduled: . amLODipine  2.5 mg Oral Daily  . atorvastatin  40 mg Oral q1800  . pantoprazole  40 mg Oral Daily  . Rivaroxaban  15 mg Oral Q supper    ASSESSMENT/PLAN:                                                                                                            79 YO female with Afib on Xeralto with LLE weakness (now improving) along with LLE decreased sensation (remains the same).  MRI obtained shows no acute infarct.  Carotid dopplers normal.  Echo pending. Prior stroke did cause decreased sensation in left arm and leg. LDL 94. UA shows a leukocytosis but culture pending.  Decreased sensation may be due to underlying UTI.   Recommend: 1) Continue Xeralto 2) finish stroke workup 3) Treat underlying infection   No further work up per neurology   Assessment and plan discussed with with attending physician and they are in agreement.    Felicie Morn PA-C [facility] [phone]  02/06/2013, 12:03 PM   Patient seen and examined together with physician assistant and I concur with the assessment and plan.  Wyatt Portela, MD

## 2013-02-06 NOTE — Progress Notes (Signed)
[facility]                                                                                Patient Demographics  [name], is a 79 y.o. female, DOB - [birth year], UJW:[id number]  Admit date - 02/05/2013   Admitting Physician Jeralyn Bennett, MD  Outpatient Primary MD for the patient is Lemont Fillers., NP  LOS - 1   Chief Complaint  Patient presents with  . Fall      Interim history This is an 79 year old female with history of CVA that presents to the emergency department with left leg weakness. Patient was suspected to have CVA. Although her CT scan did not show any acute findings. Patient is undergoing CVA workup.  PT has seen the patient and recommends a home health.  Assessment & Plan  Active Problems:   HTN (hypertension)   CVA (cerebral vascular accident)   Left leg weakness   Atrial fibrillation   Chronic anticoagulation  Left Lower Ext Weakness, ? Acute CVA. -Neurology consulted -CT head showed no acute findings -Pending MRI, echo, carotid doppler -PT/OT consulted, recommended home health -Will obtain xray of the left foot/ankle  Atrial fibrillation.  -Currently rate controlled -Continue Xarelto  Hypertension.  -Stable -Continue amlodipin 2.5mg  daily.  Dyslipidemia -Continue atorvastatin -lipid panel: 170/90/58/94  Code Status: Full  Family Communication: Daughters at bedside.  Disposition Plan: Admitted.   Pending further workup.   Procedures  Echocardiogram  Carotid Doppler  Consults   Neurology  DVT Prophylaxis  Xarelto  Lab Results  Component Value Date   PLT 192 02/06/2013    Medications  Scheduled Meds: . amLODipine  2.5 mg Oral Daily  . atorvastatin  40 mg Oral q1800  . pantoprazole  40 mg Oral Daily  . Rivaroxaban  15 mg Oral Q supper   Continuous Infusions:  PRN Meds:.acetaminophen, senna-docusate  Antibiotics    Anti-infectives   None      Time Spent in minutes   30 minutes   Cammie Faulstich D.O.  on 02/06/2013 at 11:39 AM  Between 7am to 7pm - Pager - [phone]  After 7pm go to [email or web address] - password [email or web address]  And look for the night coverage person covering for me after hours  [facility] Office  [phone]    Subjective:   [name] seen and examined today. Currently feeling better.  Continues to complain of left leg weakness and pain in her left ankle.  Patient denies dizziness, chest pain, shortness of breath, abdominal pain, N/V/D/C, new weakness, numbess, tingling.    Objective:   Filed Vitals:   02/06/13 0100 02/06/13 0518 02/06/13 1005 02/06/13 1126  BP: 148/77 125/79 138/64 131/56  Pulse: 102 89  92  Temp: 98.3 F (36.8 C) 98.9 F (37.2 C)  97.8 F (36.6 C)  TempSrc: Oral Oral  Oral  Resp: 18 18  20   Height:      Weight:      SpO2: 100% 95%  96%    Wt Readings from Last 3 Encounters:  02/05/13 54.885 kg (121 lb)  01/16/13 55.792 kg (123 lb)  11/14/12 54.922 kg (121 lb 1.3 oz)     Intake/Output Summary (  Last 24 hours) at 02/06/13 1139 Last data filed at 02/06/13 0615  Gross per 24 hour  Intake    220 ml  Output      0 ml  Net    220 ml    Exam  General: Well developed, well nourished, NAD, appears stated age  HEENT: NCAT, PERRLA, EOMI, Anicteic Sclera, mucous membranes moist. No pharyngeal erythema or exudates  Neck: Supple, no JVD, no masses  Cardiovascular: S1 S2 auscultated, no rubs, murmurs or gallops. Regular rate and rhythm.  Respiratory: Clear to auscultation bilaterally with equal chest rise  Abdomen: Soft, nontender, nondistended, + bowel sounds  Extremities: warm dry without cyanosis clubbing or edema, bruise noted on left foot  Neuro: AAOx3, cranial nerves grossly intact. Strength 5/5 in RUE, RLE, LUE.  4/5 strength in LLE.  Skin: Without rashes exudates or nodules  Psych: Normal affect and demeanor with intact judgement and insight  Data Review   Micro Results No results found for this or any previous  visit (from the past 240 hour(s)).  Radiology Reports Dg Chest 2 View  02/05/2013   CLINICAL DATA:  Stroke.  EXAM: CHEST  2 VIEW  COMPARISON:  07/02/2010.  FINDINGS: The heart remains normal in size. The lungs remain clear and hyperexpanded. Thoracic spine degenerative changes. Diffuse osteopenia.  IMPRESSION: Stable changes of COPD. No acute abnormality.   Electronically Signed   By: Gordan Payment M.D.   On: 02/05/2013 22:56   Ct Head Wo Contrast  02/05/2013   CLINICAL DATA:  Head pain post fall.  EXAM: CT HEAD WITHOUT CONTRAST  TECHNIQUE: Contiguous axial images were obtained from the base of the skull through the vertex without intravenous contrast.  COMPARISON:  08/01/2012 MR and CT.  FINDINGS: No skull fracture or intracranial hemorrhage.  Remote infarct right external capsule extending into the right coronal radiata.  Prominent small vessel disease type changes. No CT evidence of large acute infarct.  Global atrophy.  No intracranial mass lesion noted on this unenhanced exam.  IMPRESSION: No skull fracture or intracranial hemorrhage.  Remote infarct right external capsule extending into the right coronal radiata.  Prominent small vessel disease type changes. No CT evidence of large acute infarct.  Global atrophy   Electronically Signed   By: Bridgett Larsson M.D.   On: 02/05/2013 10:41    CBC  Recent Labs Lab 02/05/13 1035 02/06/13 0525  WBC 4.5 6.8  HGB 12.7 13.8  HCT 38.5 41.1  PLT 174 192  MCV 90.8 89.5  MCH 30.0 30.1  MCHC 33.0 33.6  RDW 13.3 13.7  LYMPHSABS 0.6*  --   MONOABS 0.6  --   EOSABS 0.1  --   BASOSABS 0.0  --     Chemistries   Recent Labs Lab 02/05/13 1035 02/06/13 0525  NA 142 141  K 4.2 4.0  CL 107 105  CO2 29 22  GLUCOSE 88 99  BUN 17 12  CREATININE 1.00 0.82  CALCIUM 9.0 9.1  AST 20  --   ALT 8  --   ALKPHOS 65  --   BILITOT 0.4  --     ------------------------------------------------------------------------------------------------------------------ estimated creatinine clearance is 42.7 ml/min (by C-G formula based on Cr of 0.82). ------------------------------------------------------------------------------------------------------------------ No results found for this basename: HGBA1C,  in the last 72 hours ------------------------------------------------------------------------------------------------------------------  Recent Labs  02/06/13 0525  CHOL 170  HDL 58  LDLCALC 94  TRIG 90  CHOLHDL 2.9   ------------------------------------------------------------------------------------------------------------------ No results found for this basename: TSH,  T4TOTAL, FREET3, T3FREE, THYROIDAB,  in the last 72 hours ------------------------------------------------------------------------------------------------------------------ No results found for this basename: VITAMINB12, FOLATE, FERRITIN, TIBC, IRON, RETICCTPCT,  in the last 72 hours  Coagulation profile  Recent Labs Lab 02/05/13 1035  INR 1.17    No results found for this basename: DDIMER,  in the last 72 hours  Cardiac Enzymes No results found for this basename: CK, CKMB, TROPONINI, MYOGLOBIN,  in the last 168 hours ------------------------------------------------------------------------------------------------------------------ No components found with this basename: POCBNP,

## 2013-02-06 NOTE — Evaluation (Signed)
Physical Therapy Evaluation Patient Details Name: SWEDEN LESURE MRN: [id number] DOB: [birth year] Today's Date: 02/06/2013 Time: 0930-1005 PT Time Calculation (min): 35 min  PT Assessment / Plan / Recommendation History of Present Illness  Remote infarct right external capsule extending into the right  Clinical Impression  Pt with noted L LE weakness and tenderness at ankle. Recommend Xray due to patient falling onto L side yesterday. Pt currently unsafe to return home without 24/7 assist due to balance impairment and pt requiring assist for safe functional mobility. Ambulation limited this date by dizziness. If family unable to provide 24/7 assist pt will need ST-SNF placement to achieve safe mod I function for safe transition home.     PT Assessment  Patient needs continued PT services    Follow Up Recommendations  Home health PT;Supervision/Assistance - 24 hour    Does the patient have the potential to tolerate intense rehabilitation      Barriers to Discharge   questionable if family can provide 24/7 supervision/assist    Equipment Recommendations  None recommended by PT    Recommendations for Other Services     Frequency Min 4X/week    Precautions / Restrictions Precautions Precautions: Fall Restrictions Weight Bearing Restrictions: No   Pertinent Vitals/Pain C/o "whooziness", 5/10 L ankle pain      Mobility  Bed Mobility Bed Mobility: Supine to Sit;Sit to Supine Supine to Sit: 4: Min assist;With rails;HOB elevated Sit to Supine: 4: Min assist;HOB flat Details for Bed Mobility Assistance: assist for LE management Transfers Transfers: Sit to Stand;Stand to Sit Sit to Stand: 4: Min assist;With upper extremity assist;From bed Stand to Sit: 4: Min assist;With upper extremity assist;To chair/3-in-1 Details for Transfer Assistance: v/c's for safe hand placement. assist to achieve full upright position Ambulation/Gait Ambulation/Gait Assistance: 3: Mod  assist Ambulation Distance (Feet): 10 Feet Assistive device: Rolling walker Ambulation/Gait Assistance Details: max directional verbal cues for safe walker management, tactile cues to keep walker close/not push to far out in front of self. Pt with onset of dizziness requiring to sit down. Gait Pattern: Step-to pattern;Decreased stride length;Shuffle;Trunk flexed Gait velocity: slow Stairs: No Modified Rankin (Stroke Patients Only) Pre-Morbid Rankin Score: No significant disability Modified Rankin: Moderate disability    Exercises     PT Diagnosis: Difficulty walking;Generalized weakness  PT Problem List: Decreased strength;Decreased activity tolerance;Decreased balance;Decreased mobility PT Treatment Interventions: DME instruction;Gait training;Stair training;Functional mobility training;Therapeutic activities;Therapeutic exercise;Balance training     PT Goals(Current goals can be found in the care plan section) Acute Rehab PT Goals Patient Stated Goal: to feel better PT Goal Formulation: With patient Time For Goal Achievement: 02/13/13 Potential to Achieve Goals: Good  Visit Information  Last PT Received On: 02/06/13 Assistance Needed: +1 History of Present Illness: Remote infarct right external capsule extending into the right       Prior Functioning  Home Living Family/patient expects to be discharged to:: Private residence Living Arrangements: Children Available Help at Discharge: Family;Available PRN/intermittently (all but 3-4 hours a day) Type of Home: House Home Access: Stairs to enter [employer] of Steps: 1 Entrance Stairs-Rails: None Home Layout: Two level;Bed/bath upstairs Alternate Level Stairs-Number of Steps: 15 Alternate Level Stairs-Rails: Right;Left Home Equipment: Walker - 2 wheels;Shower seat;Transport chair Additional Comments: usure if family desires SNF or HHPT. family to decide if they can provide 24/7 assist Prior Function Level of  Independence: Independent Comments: daughters provided supervision for bathing Communication Communication: No difficulties Dominant Hand: Right    Cognition  Cognition Arousal/Alertness: Awake/alert  Behavior During Therapy: WFL for tasks assessed/performed Overall Cognitive Status: Within Functional Limits for tasks assessed    Extremity/Trunk Assessment Upper Extremity Assessment Upper Extremity Assessment: Generalized weakness Lower Extremity Assessment Lower Extremity Assessment: LLE deficits/detail LLE Deficits / Details: ankle sore to touch and demo's pain with MMT at ankle. grossly 4-/5 LLE Sensation:  (reports sensation to have improved from yesterday) Cervical / Trunk Assessment Cervical / Trunk Assessment: Normal   Balance Balance Balance Assessed: Yes Static Sitting Balance Static Sitting - Balance Support: Left upper extremity supported;Feet supported Static Sitting - Level of Assistance: 4: Min assist Static Sitting - Comment/# of Minutes: 2 min, pt consistantly leaning forward due to "whooziness"  End of Session PT - End of Session Equipment Utilized During Treatment: Gait belt Activity Tolerance: Patient limited by lethargy (limited by dizziness) Patient left: in chair;with family/visitor present;with call bell/phone within reach Nurse Communication: Mobility status  GP     Marcene Brawn 02/06/2013, 11:03 AM  Lewis Shock, PT, DPT Pager #: [phone] Office #: [phone]

## 2013-02-06 NOTE — Progress Notes (Signed)
*  PRELIMINARY RESULTS* Vascular Ultrasound Carotid Duplex (Doppler) has been completed.   Findings suggest 1-39% internal carotid artery stenosis bilaterally. Vertebral arteries are patent with antegrade flow.  02/06/2013 8:58 AM Gertie Fey, RVT, RDCS, RDMS

## 2013-02-06 NOTE — Progress Notes (Signed)
Echocardiogram 2D Echocardiogram has been performed.  [name] 02/06/2013, 8:49 AM

## 2013-02-06 NOTE — Evaluation (Addendum)
Occupational Therapy Evaluation Patient Details Name: [name] MRN: [id number] DOB: [birth year] Today's Date: 02/06/2013 Time: 4098-1191 OT Time Calculation (min): 33 min  OT Assessment / Plan / Recommendation History of present illness Remote infarct right external capsule extending into the right   Clinical Impression   Pt presents with below problem list. Pt will benefit from acute OT to increase independence prior to d/c. Recommending HHOT if family can be sure that they can arrange 24/7 supervision/assistance (they state they can).    OT Assessment  Patient needs continued OT Services    Follow Up Recommendations  Home health OT;Supervision/Assistance - 24 hour    Barriers to Discharge      Equipment Recommendations  None recommended by OT    Recommendations for Other Services    Frequency  Min 2X/week    Precautions / Restrictions Precautions Precautions: Fall Restrictions Weight Bearing Restrictions: No   Pertinent Vitals/Pain No pain reported.    ADL  Grooming: Wash/dry hands;Min guard Where Assessed - Grooming: Unsupported standing Lower Body Bathing: Minimal assistance Where Assessed - Lower Body Bathing: Supported standing (OT supporting her for balance) Upper Body Dressing: Set up;Supervision/safety Where Assessed - Upper Body Dressing: Unsupported sitting Lower Body Dressing: Min guard;Other (comment) Where Assessed - Lower Body Dressing: Unsupported sit to stand Toilet Transfer: Min Pension scheme manager Method: Sit to Barista: Regular height toilet;Comfort height toilet;Grab bars Toileting - Clothing Manipulation and Hygiene: Min guard Where Assessed - Toileting Clothing Manipulation and Hygiene: Sit on 3-in-1 or toilet;Sit to stand from 3-in-1 or toilet Tub/Shower Transfer Method: Not assessed Equipment Used: Gait belt;Rolling walker Transfers/Ambulation Related to ADLs: assisted and cues for maneuvering walker/walker  safety. Min guard for transfers. ADL Comments: Educated that pt needs someone with her 24/7. OT demonstrated shower transfer. Educated pt to sit to get LB clothing over feet and to stand in front of bed/chair with walker in front when pulling them up.  Pt layed back on bed to don pants and then stood to pull them up. Educated on dressing technique. Educated to sit to bathe.   OT Diagnosis: Generalized weakness  OT Problem List: Decreased strength;Impaired balance (sitting and/or standing);Decreased activity tolerance;Decreased knowledge of use of DME or AE;Decreased knowledge of precautions;Impaired vision/perception OT Treatment Interventions: Self-care/ADL training;Therapeutic exercise;DME and/or AE instruction;Therapeutic activities;Visual/perceptual remediation/compensation;Balance training;Patient/family education;Neuromuscular education   OT Goals(Current goals can be found in the care plan section) Acute Rehab OT Goals Patient Stated Goal: not stated OT Goal Formulation: With patient/family Time For Goal Achievement: 02/13/13 Potential to Achieve Goals: Good ADL Goals Pt Will Perform Lower Body Bathing: with supervision;sit to/from stand Pt Will Perform Lower Body Dressing: with supervision;sit to/from stand Pt Will Transfer to Toilet: regular height toilet;with modified independence;grab bars Pt Will Perform Toileting - Clothing Manipulation and hygiene: with modified independence;sit to/from stand Pt Will Perform Tub/Shower Transfer: Shower transfer;with supervision;ambulating;shower seat;rolling walker  Visit Information  Last OT Received On: 02/06/13 Assistance Needed: +1 History of Present Illness: Remote infarct right external capsule extending into the right       Prior Functioning     Home Living Family/patient expects to be discharged to:: Private residence Living Arrangements: Children Available Help at Discharge: Family;Available PRN/intermittently (all but 4-5  hours a day) Type of Home: House Home Access: Stairs to enter [employer] of Steps: 1 Entrance Stairs-Rails: None Home Layout: Two level;Bed/bath upstairs Alternate Level Stairs-Number of Steps: 15 Alternate Level Stairs-Rails: Right;Left Home Equipment: Walker - 2 wheels;Shower seat;Transport chair;Grab  bars - tub/shower Additional Comments: pt not wanting SNF-daughters report they can figure out 24/7 assistance Prior Function Level of Independence: Independent Comments: daughters provided supervision for bathing Communication Communication: No difficulties Dominant Hand: Right         Vision/Perception Vision - History Baseline Vision: Wears glasses all the time Visual History: Other (comment) (sees double sometimes but moves her head to make it better and states that this is not new-pt also wears glasses) Patient Visual Report: No change from baseline Vision - Assessment Vision Assessment: Vision tested Tracking/Visual Pursuits: Other (comment) (tracking on left seemed more difficult) Visual Fields: No apparent deficits   Cognition  Cognition Arousal/Alertness: Awake/alert Behavior During Therapy: WFL for tasks assessed/performed Overall Cognitive Status: Within Functional Limits for tasks assessed    Extremity/Trunk Assessment Upper Extremity Assessment Upper Extremity Assessment: Generalized weakness;LUE deficits/detail LUE Deficits / Details: Both UE's with weakness with Lt shoulder flexion and grip weaker compared to right UE. Pt reports fifth digit getting stuck sometimes and it is painful but she is able to pull it out in extension with other hand. Lower Extremity Assessment Lower Extremity Assessment: Defer to PT evaluation     Mobility Bed Mobility Bed Mobility: Supine to Sit;Sit to Supine;Sitting - Scoot to Edge of Bed Supine to Sit: 5: Supervision Sitting - Scoot to Edge of Bed: 5: Supervision Sit to Supine: 5: Supervision Transfers Transfers:  Sit to Stand;Stand to Sit Sit to Stand: 4: Min guard;From bed;From toilet;With upper extremity assist Stand to Sit: 4: Min guard;To bed;To toilet Details for Transfer Assistance: Cues for technique      Exercise     Balance     End of Session OT - End of Session Equipment Utilized During Treatment: Gait belt;Rolling walker Activity Tolerance: Patient tolerated treatment well Patient left: in bed;with call bell/phone within reach;with family/visitor present  GO     Earlie Raveling OTR/L [phone] 02/06/2013, 4:36 PM

## 2013-02-06 NOTE — Progress Notes (Signed)
SLP Cancellation Note  Patient Details Name: ZIARA THELANDER MRN: [id number] DOB: December 27, 1924   Cancelled treatment:       Reason Eval/Treat Not Completed: Patient at procedure or test/unavailable.  Will follow up as able.  Fae Pippin, M.A., CCC-SLP [phone]  Eliyah Mcshea 02/06/2013, 2:17 PM

## 2013-02-07 ENCOUNTER — Telehealth: Payer: Self-pay | Admitting: Family

## 2013-02-07 DIAGNOSIS — I4891 Unspecified atrial fibrillation: Secondary | ICD-10-CM | POA: Diagnosis not present

## 2013-02-07 DIAGNOSIS — R29898 Other symptoms and signs involving the musculoskeletal system: Secondary | ICD-10-CM | POA: Diagnosis not present

## 2013-02-07 DIAGNOSIS — I10 Essential (primary) hypertension: Secondary | ICD-10-CM | POA: Diagnosis not present

## 2013-02-07 DIAGNOSIS — Z7901 Long term (current) use of anticoagulants: Secondary | ICD-10-CM | POA: Diagnosis not present

## 2013-02-07 MED ORDER — ATORVASTATIN CALCIUM 40 MG PO TABS: 40.0000 mg | ORAL_TABLET | Freq: Every day | ORAL | Status: DC

## 2013-02-07 NOTE — Progress Notes (Signed)
Seen and agreed 02/07/2013 Robinette, Julia Elizabeth PTA [phone] pager [phone] office    

## 2013-02-07 NOTE — Progress Notes (Signed)
Occupational Therapy Treatment Patient Details Name: [name] MRN: [id number] DOB: 1924-04-05 Today's Date: 02/07/2013 Time: 7829-5621 OT Time Calculation (min): 21 min  OT Assessment / Plan / Recommendation  History of present illness Remote infarct right external capsule extending into the right   OT comments  Pt progressing towards goals. Practiced simulated shower transfer, grooming, toileting, and LB dressing. Provided education to pt and family. Pt reports double vision at night when watching TV that has been going on for a while before she was admitted to the hospital-educated to let therapist know at Outpatient to further assess (family states pt does not drive).  Follow Up Recommendations  Outpatient OT;Supervision/Assistance - 24 hour    Barriers to Discharge       Equipment Recommendations  None recommended by OT    Recommendations for Other Services    Frequency Min 2X/week   Progress towards OT Goals Progress towards OT goals: Progressing toward goals  Plan Discharge plan needs to be updated    Precautions / Restrictions Precautions Precautions: Fall Restrictions Weight Bearing Restrictions: No   Pertinent Vitals/Pain No pain reported.     ADL  Grooming: Wash/dry hands;Supervision/safety Where Assessed - Grooming: Unsupported standing Lower Body Dressing: Min guard Where Assessed - Lower Body Dressing: Supported sit to Pharmacist, hospital: Supervision/safety Statistician Method: Sit to Barista: Comfort height toilet;Grab bars Toileting - Architect and Hygiene: Min guard Where Assessed - Engineer, mining and Hygiene: Sit to stand from 3-in-1 or toilet Tub/Shower Transfer: Simulated;Minimal assistance Tub/Shower Transfer Method: Science writer: Walk in shower;Shower seat with back Equipment Used: Gait belt;Rolling walker Transfers/Ambulation Related to ADLs: Min guard for  ambulation. Min A for shower transfer. Supervision for sit <> stand transfers. ADL Comments: Educated family to have rugs picked up in house (non skid in bathroom) and explained use of bag on walker to carry items. Practiced simulated shower transfer. Told family that someone needs to be with her doing this. Educated to sit to get LB clothing over feet and to stand in front of bed/chair when pulling up LB clothing.     OT Diagnosis:    OT Problem List:   OT Treatment Interventions:     OT Goals(current goals can now be found in the care plan section) Acute Rehab OT Goals Patient Stated Goal: not stated OT Goal Formulation: With patient/family Time For Goal Achievement: 02/13/13 Potential to Achieve Goals: Good ADL Goals Pt Will Perform Lower Body Bathing: with supervision;sit to/from stand Pt Will Perform Lower Body Dressing: with supervision;sit to/from stand Pt Will Transfer to Toilet: regular height toilet;with modified independence;grab bars Pt Will Perform Toileting - Clothing Manipulation and hygiene: with modified independence;sit to/from stand Pt Will Perform Tub/Shower Transfer: Shower transfer;with supervision;ambulating;shower seat;rolling walker  Visit Information  Last OT Received On: 02/07/13 Assistance Needed: +1 History of Present Illness: Remote infarct right external capsule extending into the right    Subjective Data      Prior Functioning       Cognition  Cognition Arousal/Alertness: Awake/alert Behavior During Therapy: WFL for tasks assessed/performed Overall Cognitive Status: Within Functional Limits for tasks assessed    Mobility  Bed Mobility Bed Mobility: Supine to Sit;Sit to Supine Supine to Sit: 5: Supervision Sit to Supine: 5: Supervision Transfers Transfers: Sit to Stand;Stand to Sit Sit to Stand: 5: Supervision;From bed;From toilet;With upper extremity assist Stand to Sit: 5: Supervision;To bed;To toilet Details for Transfer Assistance: cues  for hand placement and  technique.    Exercises      Balance     End of Session OT - End of Session Equipment Utilized During Treatment: Gait belt;Rolling walker Activity Tolerance: Patient tolerated treatment well Patient left: in bed;with call bell/phone within reach;with family/visitor present Nurse Communication: Other (comment) (followup)  GO     Earlie Raveling OTR/L [phone] 02/07/2013, 5:24 PM

## 2013-02-07 NOTE — Evaluation (Signed)
Speech Language Pathology Evaluation Patient Details Name: [name] MRN: [id number] DOB: 04/21/1924 Today's Date: 02/07/2013 Time: 0920-0939 SLP Time Calculation (min): 19 min  Problem List:  Patient Active Problem List   Diagnosis Date Noted  . CVA (cerebral vascular accident) 02/05/2013  . Left leg weakness 02/05/2013  . Atrial fibrillation 02/05/2013  . Chronic anticoagulation 02/05/2013  . HTN (hypertension) 11/14/2012  . CVA (cerebral infarction) 08/02/2012  . Syncope 06/01/2012  . Right ureteral stone 09/15/2010  . Shoulder pain 08/19/2010  . Atrial flutter 07/17/2010  . Orthostatic hypotension 07/17/2010  . OSTEOPENIA 06/03/2009  . MIGRAINE HEADACHE 02/05/2008  . CATARACTS, BILATERAL 02/05/2008  . DECREASED HEARING 02/05/2008  . GEN OSTEOARTHROSIS INVOLVING MULTIPLE SITES 02/05/2008  . COLONIC POLYPS, HX OF 02/05/2008   Past Medical History:  Past Medical History  Diagnosis Date  . Arthritis   . Hypertension   . Migraine   . Colon polyp   . Hearing difficulty   . Torus palatinus   . Atrial flutter January, 2012  . Stroke 08/02/12     right lenticular nucleus infarct   Past Surgical History:  Past Surgical History  Procedure Laterality Date  . Cataract extraction w/ intraocular lens  implant, bilateral  2006-2008  . Ganglion cyst excision Bilateral 1938,1954,2003,2005    "wrists/hand" (08/01/2012)  . Cardioversion  05/19/2010    Dr. Jacinto Halim  . Tonsillectomy  ~ 1935  . Appendectomy  02/19/53    `   HPI:  79 yo female adm to [facility] with weakness.  PMH + for CVA in May 2014.  Pt found to have evolution of right hemisphere lacunar infarct - no new event.  Speech evaluation was ordered.    Assessment / Plan / Recommendation Clinical Impression  Pt presents with functional cognitive linguistic skills for her environment.  Syntax, semantics and pragmatics intact - pt able to name 8 pictures, name 19 animals in 60 seconds demonstrating great organization skills.   Minimal dysarthria noted, which family states is from previous CVA in May 2014.   Pt resides with her three daughters and is alone for approx 4-5 hours a day.  Family manages cooking, cleaning, bill paying at home.  Pt will cook a vegetable soup at times as it is a family favorite.  Pt has support needed at home re: cognitive linguistic needs and is at her baseline.    No skilled SLP intervention indicated.     SLP Assessment  Patient does not need any further Speech Lanaguage Pathology Services    Follow Up Recommendations  None    Frequency and Duration        Pertinent Vitals/Pain Afebrile, decreased   SLP Goals   n/a  SLP Evaluation Prior Functioning  Type of Home: House Available Help at Discharge: Family;Available PRN/intermittently (all but 4-5 hours a day) Education: 2 years college Vocation: Retired (worked as an Programmer, multimedia)   Probation officer Status: Within Systems developer for tasks assessed Arousal/Alertness: Awake/alert Orientation Level: Oriented X4 Attention: Selective Selective Attention: Appears intact Memory: Appears intact Awareness: Appears intact Problem Solving: Appears intact Safety/Judgment: Appears intact    Comprehension  Auditory Comprehension Overall Auditory Comprehension: Appears within functional limits for tasks assessed Yes/No Questions: Not tested Commands: Within Functional Limits Conversation: Complex Visual Recognition/Discrimination Discrimination: Not tested Reading Comprehension Reading Status: Within funtional limits (for reading calendar)    Expression Expression Primary Mode of Expression: Verbal Verbal Expression Overall Verbal Expression: Appears within functional limits for tasks assessed Initiation: No impairment  Level of Generative/Spontaneous Verbalization: Conversation Repetition:  (DNT) Naming: No impairment Pragmatics: No impairment Written Expression Dominant Hand: Right Written Expression: Not  tested   Oral / Motor Oral Motor/Sensory Function Overall Oral Motor/Sensory Function: Impaired at baseline (from CVA in May 2014) Facial Sensation: Reduced Motor Speech Overall Motor Speech: Appears within functional limits for tasks assessed Respiration: Within functional limits Phonation: Normal Resonance: Within functional limits Articulation: Impaired Level of Impairment: Phrase (at baseline) Intelligibility: Intelligibility reduced Word: 75-100% accurate Phrase: 75-100% accurate Sentence: 75-100% accurate Conversation: 75-100% accurate Motor Planning: Not tested Motor Speech Errors: Not applicable   GO     Donavan Burnet, MS [facility] SLP [phone]

## 2013-02-07 NOTE — Progress Notes (Signed)
Discharge plan update    Updated patient to OPPT follow up as she is progressing with mobility and no longer requires HHPT. Mack Hook, DPT notified along with Case Manager. Will continue to follow patient with Acute PT.  Thanks 02/07/2013 Fredrich Birks PTA [phone] pager [phone] office

## 2013-02-07 NOTE — Telephone Encounter (Signed)
Please call pt to arrange a hospital follow up in 1-2 weeks.

## 2013-02-07 NOTE — Progress Notes (Signed)
Physical Therapy Treatment Patient Details Name: [name] MRN: [id number] DOB: [birth year] Today's Date: 02/07/2013 Time: 4098-1191 PT Time Calculation (min): 25 min  PT Assessment / Plan / Recommendation  History of Present Illness Remote infarct right external capsule extending into the right   PT Comments   Pt progressing very well with ambulation today, as well as bed mobility, transfers, and stair training.  Pt's mobility has improved and will now benefit more from Outpatient therapies to improve functional independence.  Follow Up Recommendations  Supervision/Assistance - 24 hour;Outpatient PT     Does the patient have the potential to tolerate intense rehabilitation     Barriers to Discharge        Equipment Recommendations       Recommendations for Other Services    Frequency Min 4X/week   Progress towards PT Goals Progress towards PT goals: Progressing toward goals  Plan Discharge plan needs to be updated    Precautions / Restrictions Precautions Precautions: Fall Restrictions Weight Bearing Restrictions: No   Pertinent Vitals/Pain no apparent distress     Mobility  Bed Mobility Bed Mobility: Rolling Right;Right Sidelying to Sit;Sitting - Scoot to Edge of Bed;Sit to Sidelying Right Rolling Right: 5: Supervision Right Sidelying to Sit: 5: Supervision Sitting - Scoot to Edge of Bed: 5: Supervision Sit to Sidelying Right: 5: Supervision Transfers Transfers: Sit to Stand;Stand to Sit Sit to Stand: From bed;With upper extremity assist;5: Supervision Stand to Sit: 5: Supervision;To bed;With upper extremity assist Details for Transfer Assistance: Cues for technique  Ambulation/Gait Ambulation/Gait Assistance: 4: Min guard Ambulation Distance (Feet): 600 Feet Assistive device: Rolling walker Ambulation/Gait Assistance Details: Cues for erect posture and to keep RW close; no dizziness today. Pt required seated rest break after ~300' prior to stair  training Gait Pattern: Step-through pattern;Decreased stride length;Trunk flexed Stairs: Yes Stairs Assistance: 4: Min assist Stairs Assistance Details (indicate cue type and reason): Cues for safe technique with family present Stair Management Technique: One rail Right Number of Stairs: 5    Exercises     PT Diagnosis:    PT Problem List:   PT Treatment Interventions:     PT Goals (current goals can now be found in the care plan section)    Visit Information  Last PT Received On: 02/07/13 Assistance Needed: +1 History of Present Illness: Remote infarct right external capsule extending into the right    Subjective Data      Cognition  Cognition Arousal/Alertness: Awake/alert Behavior During Therapy: WFL for tasks assessed/performed Overall Cognitive Status: Within Functional Limits for tasks assessed    Balance     End of Session PT - End of Session Equipment Utilized During Treatment: Gait belt Activity Tolerance: Patient tolerated treatment well Patient left: with family/visitor present;with call bell/phone within reach;with bed alarm set;in bed Nurse Communication: Mobility status   GP     MinnickIrving Burton, SPTA 02/07/2013, 11:08 AM

## 2013-02-07 NOTE — Discharge Summary (Signed)
Physician Discharge Summary  [name] ZOX:[id number] DOB: 1924-09-07 DOA: 02/05/2013  PCP: Lemont Fillers., NP  Admit date: 02/05/2013 Discharge date: 02/07/2013  Time spent: > 35 minutes  Recommendations for Outpatient Follow-up:  1. Please be sure to follow up with your primary care physician in 1-2 weeks should or sooner should any new concerns arise.  Discharge Diagnoses:  Active Problems:   HTN (hypertension)   CVA (cerebral vascular accident)   Left leg weakness   Atrial fibrillation   Chronic anticoagulation   Discharge Condition: Stable  Diet recommendation: Low sodium/heart healthy  Filed Weights   02/05/13 0929  Weight: 54.885 kg (121 lb)    History of present illness:  This is an 79 year old female with history of CVA that presented to the emergency department with left leg weakness. Patient was suspected to have CVA and as such underwent CVA workup.   Hospital Course:  Left Lower Ext Weakness -Neurology consulted and recommended the following: Recommend:  1) Continue Xeralto  2) finish stroke workup  3) Treat underlying infection  No further work up per neurology -CT head showed no acute findings  -MRI brain reviewed and negative for acute stroke -PT/OT consulted, recommended outpatient physical therapy prescription was provided -X-ray of the left foot was obtained to view which reported no acute abnormality - Carotid Doppler was negative for carotid artery stenosis - Echocardiogram did not reveal evidence of thrombus report  Atrial fibrillation.  -Currently rate controlled  -Continue Xarelto   Hypertension.  -Stable  -Continue amlodipin 2.5mg  daily.   Dyslipidemia  -Continue atorvastatin  -lipid panel: 170/90/58/94    Procedures:  As listed above  Consultations:  Neurology  Discharge Exam: Filed Vitals:   02/07/13 1333  BP: 107/56  Pulse: 66  Temp: 97.4 F (36.3 C)  Resp: 18    General: Pt in NAD Cardiovascular:  RRR, no MRG Respiratory: Clear to auscultation  Discharge Instructions  Discharge Orders   Future Appointments Provider Department Dept Phone   03/19/2013 8:15 AM Sandford Craze, NP  HealthCare at  [facility] [phone]   07/18/2013 2:00 PM Ronal Fear, NP [facility] [phone]   Future Orders Complete By Expires   Call MD for:  extreme fatigue  As directed    Call MD for:  temperature >100.4  As directed    Diet - low sodium heart healthy  As directed    Discharge instructions  As directed    Comments:     Discharge home with plans for patient to participate in outpatient physical therapy.   Increase activity slowly  As directed        Medication List         amLODipine 2.5 MG tablet  Commonly known as:  NORVASC  Take 1 tablet (2.5 mg total) by mouth daily. Take 1 pill (2.5mg ) if SBP above 150, 2 pills (5mg ) if SBP above 175 or DBP above100     aspirin-acetaminophen-caffeine 250-250-65 MG per tablet  Commonly known as:  EXCEDRIN MIGRAINE  Take 2 tablets by mouth daily as needed for pain (for migraines).     atorvastatin 40 MG tablet  Commonly known as:  LIPITOR  Take 1 tablet (40 mg total) by mouth daily at 6 PM.     midodrine 5 MG tablet  Commonly known as:  PROAMATINE  Take 1 tablet (5 mg total) by mouth 3 (three) times daily with meals as needed (While awake for dizziness).     multivitamin with minerals Tabs  tablet  Take 1 tablet by mouth daily.     Rivaroxaban 15 MG Tabs tablet  Commonly known as:  XARELTO  Take 1 tablet (15 mg total) by mouth daily with supper.       Allergies  Allergen Reactions  . Tramadol Hcl Other (See Comments)     lethargy, nausea  . Alendronate Sodium Rash and Other (See Comments)    puffy eyes      The results of significant diagnostics from this hospitalization (including imaging, microbiology, ancillary and laboratory) are listed below for reference.    Significant Diagnostic Studies: Dg  Chest 2 View  02/05/2013   CLINICAL DATA:  Stroke.  EXAM: CHEST  2 VIEW  COMPARISON:  07/02/2010.  FINDINGS: The heart remains normal in size. The lungs remain clear and hyperexpanded. Thoracic spine degenerative changes. Diffuse osteopenia.  IMPRESSION: Stable changes of COPD. No acute abnormality.   Electronically Signed   By: Gordan Payment M.D.   On: 02/05/2013 22:56   Ct Head Wo Contrast  02/05/2013   CLINICAL DATA:  Head pain post fall.  EXAM: CT HEAD WITHOUT CONTRAST  TECHNIQUE: Contiguous axial images were obtained from the base of the skull through the vertex without intravenous contrast.  COMPARISON:  08/01/2012 MR and CT.  FINDINGS: No skull fracture or intracranial hemorrhage.  Remote infarct right external capsule extending into the right coronal radiata.  Prominent small vessel disease type changes. No CT evidence of large acute infarct.  Global atrophy.  No intracranial mass lesion noted on this unenhanced exam.  IMPRESSION: No skull fracture or intracranial hemorrhage.  Remote infarct right external capsule extending into the right coronal radiata.  Prominent small vessel disease type changes. No CT evidence of large acute infarct.  Global atrophy   Electronically Signed   By: Bridgett Larsson M.D.   On: 02/05/2013 10:41   Mr Brain Wo Contrast  02/06/2013   CLINICAL DATA:  80 year old female with left lower extremity weakness. Initial encounter.  EXAM: MRI HEAD WITHOUT CONTRAST  MRA HEAD WITHOUT CONTRAST  TECHNIQUE: Multiplanar, multiecho pulse sequences of the brain and surrounding structures were obtained without intravenous contrast. Angiographic images of the head were obtained using MRA technique without contrast.  COMPARISON:  Head CT 02/05/2013. Brain MRI and MRA 08/01/2012.  FINDINGS: MRI HEAD FINDINGS  Expected evolution of the right corona radiata -lentiform nuclei infarct which occurred in May. Cystic encephalomalacia. No restricted diffusion or evidence of acute infarction.  Major  intracranial vascular flow voids are stable. Stable cerebral volume. No ventriculomegaly. No midline shift, mass effect, or evidence of intracranial mass lesion. No acute intracranial hemorrhage identified. Negative pituitary. Negative cervicomedullary junction. Stable visualized cervical spine. Confluent cerebral white matter T2 and FLAIR hyperintensity re- identified. Moderate T2 heterogeneity in the deep gray matter nuclei, in part related to perivascular spaces. Moderate T2 hyperintensity in the pons. Chronic lacunar infarcts in the right cerebellum.  Stable orbits soft tissues. Stable paranasal sinuses and mastoids. Visualized scalp soft tissues are within normal limits. Normal bone marrow signal.  MRA HEAD FINDINGS  Stable antegrade flow in the posterior circulation with codominant distal vertebral arteries. Normal left PICA. Dominant, duplicated right AICA. Patent basilar artery without stenosis. SCA and PCA origins are within normal limits. Posterior communicating artery is diminutive or absent. Mild to moderate bilateral PCA branch irregularity is stable with preserved distal flow.  Antegrade flow in both ICA siphons. Stable ICA ectasia. No ICA stenosis. Ophthalmic artery origins are normal. Stable and normal carotid  termini, MCA and ACA origins. Normal anterior communicating artery.  Stable moderate to severe left ACA A2 stenosis with preserved distal flow. Suggestion of a new right ACA callosomarginal region high-grade stenosis (series 5, image 128) with preserved distal flow.  Mild bilateral MCA irregularity, relatively sparing the M1 segments, is stable. No major MCA branch occlusion identified.  IMPRESSION: 1. No acute intracranial abnormality. Expected evolution of right hemisphere lacunar infarct since May. Underlying advanced chronic small vessel disease.  2. Suggestion of new distal right ACA hemodynamically significant stenosis, at the callosomarginal artery level, with preserved distal flow.  3.  Otherwise stable intracranial MRA since 08/01/2012, with overall moderate medium-sized vessel irregularity compatible with atherosclerosis.   Electronically Signed   By: Augusto Gamble M.D.   On: 02/06/2013 09:07   Dg Foot 2 Views Left  02/06/2013   CLINICAL DATA:  Foot pain  EXAM: LEFT FOOT - 2 VIEW  COMPARISON:  None.  FINDINGS: Normal alignment. Negative for fracture. Calcification in the Achilles tendon. No significant arthropathy.  IMPRESSION: No acute abnormality.   Electronically Signed   By: Marlan Palau M.D.   On: 02/06/2013 15:18   Mr Maxine Glenn Head/brain Wo Cm  02/06/2013   CLINICAL DATA:  79 year old female with left lower extremity weakness. Initial encounter.  EXAM: MRI HEAD WITHOUT CONTRAST  MRA HEAD WITHOUT CONTRAST  TECHNIQUE: Multiplanar, multiecho pulse sequences of the brain and surrounding structures were obtained without intravenous contrast. Angiographic images of the head were obtained using MRA technique without contrast.  COMPARISON:  Head CT 02/05/2013. Brain MRI and MRA 08/01/2012.  FINDINGS: MRI HEAD FINDINGS  Expected evolution of the right corona radiata -lentiform nuclei infarct which occurred in May. Cystic encephalomalacia. No restricted diffusion or evidence of acute infarction.  Major intracranial vascular flow voids are stable. Stable cerebral volume. No ventriculomegaly. No midline shift, mass effect, or evidence of intracranial mass lesion. No acute intracranial hemorrhage identified. Negative pituitary. Negative cervicomedullary junction. Stable visualized cervical spine. Confluent cerebral white matter T2 and FLAIR hyperintensity re- identified. Moderate T2 heterogeneity in the deep gray matter nuclei, in part related to perivascular spaces. Moderate T2 hyperintensity in the pons. Chronic lacunar infarcts in the right cerebellum.  Stable orbits soft tissues. Stable paranasal sinuses and mastoids. Visualized scalp soft tissues are within normal limits. Normal bone marrow  signal.  MRA HEAD FINDINGS  Stable antegrade flow in the posterior circulation with codominant distal vertebral arteries. Normal left PICA. Dominant, duplicated right AICA. Patent basilar artery without stenosis. SCA and PCA origins are within normal limits. Posterior communicating artery is diminutive or absent. Mild to moderate bilateral PCA branch irregularity is stable with preserved distal flow.  Antegrade flow in both ICA siphons. Stable ICA ectasia. No ICA stenosis. Ophthalmic artery origins are normal. Stable and normal carotid termini, MCA and ACA origins. Normal anterior communicating artery.  Stable moderate to severe left ACA A2 stenosis with preserved distal flow. Suggestion of a new right ACA callosomarginal region high-grade stenosis (series 5, image 128) with preserved distal flow.  Mild bilateral MCA irregularity, relatively sparing the M1 segments, is stable. No major MCA branch occlusion identified.  IMPRESSION: 1. No acute intracranial abnormality. Expected evolution of right hemisphere lacunar infarct since May. Underlying advanced chronic small vessel disease.  2. Suggestion of new distal right ACA hemodynamically significant stenosis, at the callosomarginal artery level, with preserved distal flow.  3. Otherwise stable intracranial MRA since 08/01/2012, with overall moderate medium-sized vessel irregularity compatible with atherosclerosis.   Electronically Signed  By: Augusto Gamble M.D.   On: 02/06/2013 09:07    Microbiology: Recent Results (from the past 240 hour(s))  URINE CULTURE     Status: None   Collection Time    02/05/13  9:26 PM      Result Value Range Status   Specimen Description URINE, CLEAN CATCH   Final   Special Requests CX ADDED AT 2151 ON [id number]   Final   Culture  Setup Time     Final   Value: 02/06/2013 01:12     Performed at [facility]   Culture     Final   Value: Multiple bacterial morphotypes present, none predominant. Suggest appropriate recollection  if clinically indicated.     Performed at [facility]   Report Status 02/06/2013 FINAL   Final     Labs: Basic Metabolic Panel:  Recent Labs Lab 02/05/13 1035 02/06/13 0525  NA 142 141  K 4.2 4.0  CL 107 105  CO2 29 22  GLUCOSE 88 99  BUN 17 12  CREATININE 1.00 0.82  CALCIUM 9.0 9.1   Liver Function Tests:  Recent Labs Lab 02/05/13 1035  AST 20  ALT 8  ALKPHOS 65  BILITOT 0.4  PROT 6.5  ALBUMIN 3.3*   No results found for this basename: LIPASE, AMYLASE,  in the last 168 hours No results found for this basename: AMMONIA,  in the last 168 hours CBC:  Recent Labs Lab 02/05/13 1035 02/06/13 0525  WBC 4.5 6.8  NEUTROABS 3.3  --   HGB 12.7 13.8  HCT 38.5 41.1  MCV 90.8 89.5  PLT 174 192   Cardiac Enzymes: No results found for this basename: CKTOTAL, CKMB, CKMBINDEX, TROPONINI,  in the last 168 hours BNP: BNP (last 3 results) No results found for this basename: PROBNP,  in the last 8760 hours CBG: No results found for this basename: GLUCAP,  in the last 168 hours     Signed:  Penny Pia  [facility] 02/07/2013, 3:57 PM

## 2013-02-07 NOTE — Progress Notes (Signed)
Agree with PTA.    Israella Hubert, PT [phone]  

## 2013-02-08 NOTE — Progress Notes (Signed)
Patient discharged prior to Outpatient services were arranged; telephone call to patient/ daughter - [facility] is [facility]able [facility]; referral made as requested; patient also has PCS with [facility] home health care; Alexis Goodell [phone]

## 2013-02-08 NOTE — Telephone Encounter (Signed)
Appointment scheduled for 02/16/13

## 2013-02-09 ENCOUNTER — Telehealth: Payer: Self-pay | Admitting: Neurology

## 2013-02-09 ENCOUNTER — Telehealth: Payer: Self-pay | Admitting: *Deleted

## 2013-02-09 NOTE — Telephone Encounter (Signed)
Received call from [facility], nurse with [facility]. She states pt will not qualify for home health services under Medicare guidelines because pt will be starting outpatient therapy next week. States this makes her ineligible for home health.  Please advise.

## 2013-02-09 NOTE — Telephone Encounter (Signed)
Spoke with pt's daughter, Kit. She reports that she has spoken with [facility] and they have arranged appt for pt to see them next week. States there was some confusion initially about outpt neuro vs home health. Pt and daughter decided outpt neuro would be more beneficial for pt than home health.  She states she is feeling better about things at this point and doesn't need anything from [place] at this time. Pt has f/u with [place] on 02/16/13.

## 2013-02-09 NOTE — Telephone Encounter (Signed)
Called patient to sched stroke f/u appt,  daughter to call back to sched.3 f/u

## 2013-02-09 NOTE — Telephone Encounter (Signed)
Received message from pt's daughter, [name] wanting a call back re: pt's post hospital course. She states things have gotten all messed up.  Attempted to reach [facility] and left message to return my call.

## 2013-02-09 NOTE — Telephone Encounter (Signed)
Patient was hospitalized for a fall, no new stroke.  She should keep her next stroke follow up appointment. If she has no appointment, she should come in 3 months.

## 2013-02-09 NOTE — Telephone Encounter (Signed)
Called [facility] and they have the order for PT/OT,they have called daughter.  [facility] would like to know if [facility] will f/u with patient for the therapy.

## 2013-02-12 ENCOUNTER — Emergency Department (HOSPITAL_COMMUNITY): Payer: Medicare Other

## 2013-02-12 ENCOUNTER — Inpatient Hospital Stay (HOSPITAL_COMMUNITY)
Admission: EM | Admit: 2013-02-12 | Discharge: 2013-02-15 | DRG: 065 | Disposition: A | Discharge status: Rehabilitation Facility | Payer: Medicare Other | Attending: Internal Medicine | Admitting: Internal Medicine

## 2013-02-12 ENCOUNTER — Observation Stay (HOSPITAL_COMMUNITY): Payer: Medicare Other

## 2013-02-12 ENCOUNTER — Inpatient Hospital Stay (HOSPITAL_COMMUNITY): Payer: Medicare Other

## 2013-02-12 ENCOUNTER — Encounter (HOSPITAL_COMMUNITY): Payer: Self-pay | Admitting: Radiology

## 2013-02-12 DIAGNOSIS — I480 Paroxysmal atrial fibrillation: Secondary | ICD-10-CM | POA: Diagnosis present

## 2013-02-12 DIAGNOSIS — I69959 Hemiplegia and hemiparesis following unspecified cerebrovascular disease affecting unspecified side: Secondary | ICD-10-CM | POA: Diagnosis not present

## 2013-02-12 DIAGNOSIS — R29898 Other symptoms and signs involving the musculoskeletal system: Secondary | ICD-10-CM | POA: Diagnosis not present

## 2013-02-12 DIAGNOSIS — R29818 Other symptoms and signs involving the nervous system: Secondary | ICD-10-CM | POA: Diagnosis not present

## 2013-02-12 DIAGNOSIS — R630 Anorexia: Secondary | ICD-10-CM | POA: Diagnosis not present

## 2013-02-12 DIAGNOSIS — M25519 Pain in unspecified shoulder: Secondary | ICD-10-CM

## 2013-02-12 DIAGNOSIS — G43909 Migraine, unspecified, not intractable, without status migrainosus: Secondary | ICD-10-CM | POA: Diagnosis present

## 2013-02-12 DIAGNOSIS — I951 Orthostatic hypotension: Secondary | ICD-10-CM

## 2013-02-12 DIAGNOSIS — Z5189 Encounter for other specified aftercare: Secondary | ICD-10-CM | POA: Diagnosis not present

## 2013-02-12 DIAGNOSIS — I69992 Facial weakness following unspecified cerebrovascular disease: Secondary | ICD-10-CM | POA: Diagnosis not present

## 2013-02-12 DIAGNOSIS — R5381 Other malaise: Secondary | ICD-10-CM | POA: Diagnosis present

## 2013-02-12 DIAGNOSIS — R2990 Unspecified symptoms and signs involving the nervous system: Secondary | ICD-10-CM

## 2013-02-12 DIAGNOSIS — Z823 Family history of stroke: Secondary | ICD-10-CM

## 2013-02-12 DIAGNOSIS — G459 Transient cerebral ischemic attack, unspecified: Secondary | ICD-10-CM | POA: Diagnosis not present

## 2013-02-12 DIAGNOSIS — I4892 Unspecified atrial flutter: Secondary | ICD-10-CM | POA: Diagnosis not present

## 2013-02-12 DIAGNOSIS — G8190 Hemiplegia, unspecified affecting unspecified side: Secondary | ICD-10-CM | POA: Diagnosis not present

## 2013-02-12 DIAGNOSIS — M48061 Spinal stenosis, lumbar region without neurogenic claudication: Secondary | ICD-10-CM | POA: Diagnosis not present

## 2013-02-12 DIAGNOSIS — I639 Cerebral infarction, unspecified: Secondary | ICD-10-CM

## 2013-02-12 DIAGNOSIS — I6350 Cerebral infarction due to unspecified occlusion or stenosis of unspecified cerebral artery: Principal | ICD-10-CM

## 2013-02-12 DIAGNOSIS — M5137 Other intervertebral disc degeneration, lumbosacral region: Secondary | ICD-10-CM | POA: Diagnosis not present

## 2013-02-12 DIAGNOSIS — I4891 Unspecified atrial fibrillation: Secondary | ICD-10-CM | POA: Diagnosis not present

## 2013-02-12 DIAGNOSIS — R55 Syncope and collapse: Secondary | ICD-10-CM | POA: Diagnosis not present

## 2013-02-12 DIAGNOSIS — E785 Hyperlipidemia, unspecified: Secondary | ICD-10-CM | POA: Diagnosis present

## 2013-02-12 DIAGNOSIS — Z7982 Long term (current) use of aspirin: Secondary | ICD-10-CM

## 2013-02-12 DIAGNOSIS — I6789 Other cerebrovascular disease: Secondary | ICD-10-CM | POA: Diagnosis not present

## 2013-02-12 DIAGNOSIS — M1990 Unspecified osteoarthritis, unspecified site: Secondary | ICD-10-CM | POA: Diagnosis present

## 2013-02-12 DIAGNOSIS — G43109 Migraine with aura, not intractable, without status migrainosus: Secondary | ICD-10-CM

## 2013-02-12 DIAGNOSIS — H9190 Unspecified hearing loss, unspecified ear: Secondary | ICD-10-CM | POA: Diagnosis present

## 2013-02-12 DIAGNOSIS — I10 Essential (primary) hypertension: Secondary | ICD-10-CM | POA: Diagnosis present

## 2013-02-12 DIAGNOSIS — Z87891 Personal history of nicotine dependence: Secondary | ICD-10-CM | POA: Diagnosis not present

## 2013-02-12 DIAGNOSIS — G3184 Mild cognitive impairment, so stated: Secondary | ICD-10-CM | POA: Diagnosis not present

## 2013-02-12 DIAGNOSIS — R569 Unspecified convulsions: Secondary | ICD-10-CM | POA: Diagnosis present

## 2013-02-12 DIAGNOSIS — H269 Unspecified cataract: Secondary | ICD-10-CM

## 2013-02-12 DIAGNOSIS — N201 Calculus of ureter: Secondary | ICD-10-CM

## 2013-02-12 DIAGNOSIS — Z7901 Long term (current) use of anticoagulants: Secondary | ICD-10-CM | POA: Diagnosis not present

## 2013-02-12 DIAGNOSIS — I6330 Cerebral infarction due to thrombosis of unspecified cerebral artery: Secondary | ICD-10-CM | POA: Diagnosis not present

## 2013-02-12 DIAGNOSIS — M899 Disorder of bone, unspecified: Secondary | ICD-10-CM

## 2013-02-12 DIAGNOSIS — M159 Polyosteoarthritis, unspecified: Secondary | ICD-10-CM

## 2013-02-12 DIAGNOSIS — E7800 Pure hypercholesterolemia, unspecified: Secondary | ICD-10-CM | POA: Insufficient documentation

## 2013-02-12 DIAGNOSIS — R739 Hyperglycemia, unspecified: Secondary | ICD-10-CM

## 2013-02-12 DIAGNOSIS — Z86010 Personal history of colonic polyps: Secondary | ICD-10-CM

## 2013-02-12 HISTORY — DX: Long term (current) use of anticoagulants: Z79.01

## 2013-02-12 HISTORY — DX: Pure hypercholesterolemia, unspecified: E78.00

## 2013-02-12 HISTORY — DX: Other symptoms and signs involving the musculoskeletal system: R29.898

## 2013-02-12 LAB — COMPREHENSIVE METABOLIC PANEL
ALT: 14 U/L (ref 0–35)
AST: 25 U/L (ref 0–37)
Alkaline Phosphatase: 81 U/L (ref 39–117)
CO2: 24 mEq/L (ref 19–32)
Calcium: 8.9 mg/dL (ref 8.4–10.5)
Chloride: 104 mEq/L (ref 96–112)
GFR calc Af Amer: 59 mL/min — ABNORMAL LOW (ref >90)
GFR calc non Af Amer: 51 mL/min — ABNORMAL LOW (ref >90)
Glucose, Bld: 168 mg/dL — ABNORMAL HIGH (ref 70–99)
Potassium: 3.8 mEq/L (ref 3.5–5.1)
Sodium: 139 mEq/L (ref 135–145)
Total Bilirubin: 0.6 mg/dL (ref 0.3–1.2)

## 2013-02-12 LAB — URINALYSIS, ROUTINE W REFLEX MICROSCOPIC
Bilirubin Urine: NEGATIVE
Leukocytes, UA: NEGATIVE
Nitrite: NEGATIVE
Protein, ur: NEGATIVE mg/dL
Specific Gravity, Urine: 1.014 (ref 1.005–1.030)
Urobilinogen, UA: 0.2 mg/dL (ref 0.0–1.0)
pH: 7.5 (ref 5.0–8.0)

## 2013-02-12 LAB — POCT I-STAT, CHEM 8
BUN: 14 mg/dL (ref 6–23)
Chloride: 104 mEq/L (ref 96–112)
Glucose, Bld: 171 mg/dL — ABNORMAL HIGH (ref 70–99)
HCT: 45 % (ref 36.0–46.0)
Hemoglobin: 15.3 g/dL — ABNORMAL HIGH (ref 12.0–15.0)
Potassium: 3.8 mEq/L (ref 3.5–5.1)

## 2013-02-12 LAB — VITAMIN B12: Vitamin B-12: 627 pg/mL (ref 211–911)

## 2013-02-12 LAB — CBC
MCHC: 32.4 g/dL (ref 30.0–36.0)
MCV: 90.1 fL (ref 78.0–100.0)
Platelets: 179 10*3/uL (ref 150–400)
RBC: 4.55 MIL/uL (ref 3.87–5.11)
RDW: 13.5 % (ref 11.5–15.5)
WBC: 5.2 10*3/uL (ref 4.0–10.5)

## 2013-02-12 LAB — RAPID URINE DRUG SCREEN, HOSP PERFORMED
Amphetamines: NOT DETECTED
Barbiturates: NOT DETECTED
Cocaine: NOT DETECTED
Tetrahydrocannabinol: NOT DETECTED

## 2013-02-12 LAB — GLUCOSE, CAPILLARY: Glucose-Capillary: 132 mg/dL — ABNORMAL HIGH (ref 70–99)

## 2013-02-12 LAB — APTT: aPTT: 33 seconds (ref 24–37)

## 2013-02-12 LAB — URINE MICROSCOPIC-ADD ON

## 2013-02-12 LAB — DIFFERENTIAL
Basophils Absolute: 0.1 10*3/uL (ref 0.0–0.1)
Eosinophils Relative: 4 % (ref 0–5)
Lymphocytes Relative: 23 % (ref 12–46)
Lymphs Abs: 1.2 10*3/uL (ref 0.7–4.0)
Neutro Abs: 3.1 10*3/uL (ref 1.7–7.7)

## 2013-02-12 LAB — PROTIME-INR: Prothrombin Time: 18.2 seconds — ABNORMAL HIGH (ref 11.6–15.2)

## 2013-02-12 LAB — POCT I-STAT TROPONIN I: Troponin i, poc: 0.01 ng/mL (ref 0.00–0.08)

## 2013-02-12 LAB — FOLATE: Folate: >20 ng/mL

## 2013-02-12 LAB — ETHANOL: Alcohol, Ethyl (B): <11 mg/dL (ref 0–11)

## 2013-02-12 LAB — HEMOGLOBIN A1C: Hgb A1c MFr Bld: 5.9 % — ABNORMAL HIGH (ref <5.7)

## 2013-02-12 MED ORDER — AMLODIPINE BESYLATE 2.5 MG PO TABS
2.5000 mg | ORAL_TABLET | Freq: Every day | ORAL | Status: DC
  Administered 2013-02-14: 2.5 mg via ORAL
  Filled 2013-02-12 (×4): qty 1

## 2013-02-12 MED ORDER — ASPIRIN-ACETAMINOPHEN-CAFFEINE 250-250-65 MG PO TABS
2.0000 | ORAL_TABLET | Freq: Every day | ORAL | Status: DC | PRN
  Administered 2013-02-12 – 2013-02-15 (×4): 2 via ORAL
  Filled 2013-02-12 (×5): qty 2

## 2013-02-12 MED ORDER — INSULIN ASPART 100 UNIT/ML ~~LOC~~ SOLN: 0.0000 [IU] | Freq: Three times a day (TID) | SUBCUTANEOUS | Status: DC

## 2013-02-12 MED ORDER — SODIUM CHLORIDE 0.9 % IV SOLN
Freq: Once | INTRAVENOUS | Status: AC
  Administered 2013-02-12: 09:00:00 via INTRAVENOUS

## 2013-02-12 MED ORDER — SODIUM CHLORIDE 0.9 % IJ SOLN
3.0000 mL | Freq: Two times a day (BID) | INTRAMUSCULAR | Status: DC
  Administered 2013-02-12 – 2013-02-14 (×5): 3 mL via INTRAVENOUS

## 2013-02-12 MED ORDER — ATORVASTATIN CALCIUM 40 MG PO TABS
40.0000 mg | ORAL_TABLET | Freq: Every day | ORAL | Status: DC
  Administered 2013-02-12 – 2013-02-15 (×4): 40 mg via ORAL
  Filled 2013-02-12 (×4): qty 1

## 2013-02-12 MED ORDER — ADULT MULTIVITAMIN W/MINERALS CH
1.0000 | ORAL_TABLET | Freq: Every day | ORAL | Status: DC
  Administered 2013-02-12 – 2013-02-15 (×4): 1 via ORAL
  Filled 2013-02-12 (×4): qty 1

## 2013-02-12 MED ORDER — FENTANYL CITRATE 0.05 MG/ML IJ SOLN
25.0000 ug | INTRAMUSCULAR | Status: DC | PRN
  Administered 2013-02-12: 25 ug via INTRAVENOUS
  Filled 2013-02-12: qty 2

## 2013-02-12 MED ORDER — ONDANSETRON HCL 4 MG/2ML IJ SOLN
4.0000 mg | INTRAMUSCULAR | Status: AC | PRN
  Administered 2013-02-12 (×2): 4 mg via INTRAVENOUS
  Filled 2013-02-12 (×2): qty 2

## 2013-02-12 MED ORDER — SODIUM CHLORIDE 0.9 % IV SOLN: INTRAVENOUS | Status: DC

## 2013-02-12 MED ORDER — RIVAROXABAN 15 MG PO TABS
15.0000 mg | ORAL_TABLET | Freq: Every day | ORAL | Status: DC
  Administered 2013-02-12 – 2013-02-15 (×4): 15 mg via ORAL
  Filled 2013-02-12 (×4): qty 1

## 2013-02-12 NOTE — ED Notes (Signed)
Pt LUE back to normal & mvmt noted to LLE. States h/a improved after meds earlier

## 2013-02-12 NOTE — ED Notes (Signed)
Patient transported to MRI 

## 2013-02-12 NOTE — Code Documentation (Signed)
79 year old female presents to [facility] as Code Stroke.  Code stroke was called at 0710 with ETA 10-15 mins.  Patient arrived to ED at 0721.  Was met at bridge by EDP at [phone].  Stroke team arrival at 0729.  TO CT scan at 0727.  LSW 0630.  Patient states she woke in her normal state of health this morning and followed her normal routine of gettng up and getting dressed -brushing her teeth and waiting for her daughter to walk down the stairs with her.  On her way down the stairs the patient states she felt her left leg and arm go numb and then they would not work.  She also thinks she had trouble speaking.  EMS was called and she was transported to ED.  In ED her NIHSS is 08.  Left arm with some spont movement but cannot resist gravity.  Left leg no effort against gravity.  Left sensation loss.  Got age wrong consistently - transposes numbers - she is 74 but states 60.  Speech is clear - some left facial droop.  Patient is on Xarelto for Afib - has had recent stroke in May with left side sx.  Daughters arrive later and report that patient had period of complete unresponsiveness with initiation of episode.  No incontinence noted.  Daughter did not appreciate any jerking movements etc.  Daughter is an [employer] fund[employer].  BP stable.  In/out afib on monitor.  Handoff to [facility].  To monitor neuro closely and call if sx worsens - in window for IR til 230pm.

## 2013-02-12 NOTE — H&P (Addendum)
[facility] History and Physical  [name] ZOX:[id number] DOB: October 29, 1924 DOA: 02/12/2013  Referring physician: Dr. Clarene Duke PCP: Lemont Fillers., NP  Specialist: Dr. Roseanne Reno  Chief Complaint: Left-sided weakness  HPI: [name] is a 79 y.o. female  Pt awoke at approximately 0600 today and was normal at this time per daughter. Pt ambulated with full capacity to the restroom a couple times and proceeded with normal daily routine. Per daughter, at approximately 35, pt c/o of sudden onset severe left leg weakness as if "leg was asleep" and requested to sit down. Immediately after sitting, pt c/o of inability to move entire left side of body, then became unresponsive and incontinent. EMS was contacted. Pt became responsive, but still c/o of extreme left-sided weakness and disorientation. Now able to move all extremities, with some left-sided weakness still persisting. Also c/o mild headache. Pt does have hx of years of infrequent migraines without aura relieved effectively with Excedrin. Pt was in the hospital on 02/07/13 with sudden-onset left leg weakness, but less severe than this episode.   Review of Systems: The patient denies anorexia, fever, weight loss,, vision loss, decreased hearing, hoarseness, chest pain, syncope, dyspnea on exertion, peripheral edema, hemoptysis, abdominal pain, melena, hematochezia, severe indigestion/heartburn, hematuria, genital sores, muscle weakness, suspicious skin lesions, transient blindness, depression, unusual weight change, abnormal bleeding, enlarged lymph nodes, angioedema, and breast masses.    Past Medical History  Diagnosis Date  . Arthritis   . Hypertension   . Migraine   . Colon polyp   . Hearing difficulty   . Torus palatinus   . Atrial flutter January, 2012  . Stroke 08/02/12     right lenticular nucleus infarct  . Left leg weakness 02/05/2013  . Chronic anticoagulation   . High cholesterol    Past Surgical History   Procedure Laterality Date  . Cataract extraction w/ intraocular lens  implant, bilateral  2006-2008  . Ganglion cyst excision Bilateral 1938,1954,2003,2005    "wrists/hand" (08/01/2012)  . Cardioversion  05/19/2010    Dr. Jacinto Halim  . Tonsillectomy  ~ 1935  . Appendectomy  02/19/53    `   Social History: reports that she has quit smoking 20 years ago (20+ pack years). She has never used smokeless tobacco. She reports drinking 1 glass of wine per week, but not since mid May 2014. She reports that she does not use illicit drugs.   Patient lives at home with her daughter and is able to participate in ADL's with minimal assistance.   Allergies  Allergen Reactions  . Tramadol Hcl Other (See Comments)     lethargy, nausea  . Alendronate Sodium Rash and Other (See Comments)    puffy eyes    Family History  Problem Relation Age of Onset  . Colon cancer    . Breast cancer    . Brain cancer CVA Sister Sister   . Cancer Mother     breast  . Aneurysm Mother     brain  . Heart attack Neg Hx   . Diabetes Neg Hx   . Hypertension Neg Hx     Prior to Admission medications   Medication Sig Start Date End Date Taking? Authorizing Provider  amLODipine (NORVASC) 2.5 MG tablet Take 2.5-5 mg by mouth daily as needed. Takes 1 tablet if SBP >150; 2 tablets if SPB . 175 or DBP > 100.   Yes Historical Provider, MD  aspirin-acetaminophen-caffeine (EXCEDRIN MIGRAINE) 250-266-3822 MG per tablet Take 2 tablets by mouth daily as  needed for pain (for migraines).   Yes Historical Provider, MD  atorvastatin (LIPITOR) 40 MG tablet Take 1 tablet (40 mg total) by mouth daily at 6 PM. 02/07/13  Yes Penny Pia, MD  midodrine (PROAMATINE) 5 MG tablet Take 5 mg by mouth 3 (three) times daily as needed (for low BP). While awake for dizziness   Yes Historical Provider, MD  Multiple Vitamin (MULTIVITAMIN WITH MINERALS) TABS Take 1 tablet by mouth daily.   Yes Historical Provider, MD  Rivaroxaban (XARELTO) 15 MG TABS  tablet Take 1 tablet (15 mg total) by mouth daily with supper. 08/03/12  Yes Ripudeep Jenna Luo, MD   Physical Exam: Filed Vitals:   02/12/13 1112  BP: 156/83  Pulse: 81  Temp: 97.7 F (36.5 C)  Resp: 16     General:  Pt in NAD, Alert and Awake  Eyes: EOMI, non icteric  ENT: normal exterior appearance, MMM  Neck: supple, no goiter  Cardiovascular: irregularly irregular rate controlled, No Murmurs  Respiratory: CTA BL, no wheezes  Abdomen: soft, NT, ND  Skin: warm and dry  Musculoskeletal: no cyanosis or clubbing  Psychiatric: mood and affect appropriate  Neurologic: L sided weakness, answers questions appropriately.  Labs on Admission:  Basic Metabolic Panel:  Recent Labs Lab 02/06/13 0525 02/12/13 0730 02/12/13 0733  NA 141 139 142  K 4.0 3.8 3.8  CL 105 104 104  CO2 22 24  --   GLUCOSE 99 168* 171*  BUN 12 13 14   CREATININE 0.82 0.98 1.20*  CALCIUM 9.1 8.9  --    Liver Function Tests:  Recent Labs Lab 02/12/13 0730  AST 25  ALT 14  ALKPHOS 81  BILITOT 0.6  PROT 6.9  ALBUMIN 3.4*   No results found for this basename: LIPASE, AMYLASE,  in the last 168 hours No results found for this basename: AMMONIA,  in the last 168 hours CBC:  Recent Labs Lab 02/06/13 0525 02/12/13 0730 02/12/13 0733  WBC 6.8 5.2  --   NEUTROABS  --  3.1  --   HGB 13.8 13.3 15.3*  HCT 41.1 41.0 45.0  MCV 89.5 90.1  --   PLT 192 179  --    Cardiac Enzymes:  Recent Labs Lab 02/12/13 0730  TROPONINI <0.30    BNP (last 3 results) No results found for this basename: PROBNP,  in the last 8760 hours CBG:  Recent Labs Lab 02/12/13 0820  GLUCAP 132*    Radiological Exams on Admission: Ct Head Wo Contrast  02/12/2013   CLINICAL DATA:  Left-sided weakness  EXAM: CT HEAD WITHOUT CONTRAST  TECHNIQUE: Contiguous axial images were obtained from the base of the skull through the vertex without intravenous contrast.  COMPARISON:  Brain MRI 02/06/2013 and head CT scan  02/05/2013.  FINDINGS: Extensive chronic microvascular ischemic change is again seen with a remote corona radiata infarct on the right identified. No evidence of acute abnormality including infarction, hemorrhage, mass lesion, mass effect, midline shift abnormal extra-axial fluid collection is identified. There is no hydrocephalus or pneumocephalus. The calvarium is intact.  IMPRESSION: No acute finding.  Atrophy, chronic microvascular ischemic change and remote corona radiata infarct, unchanged.  Critical Value/emergent results were called by telephone at the time of interpretation on 02/12/2013 at 7:41 AM to Dr.Stewart , who verbally acknowledged these results.   Electronically Signed   By: Drusilla Kanner M.D.   On: 02/12/2013 07:41   Mr Brain Wo Contrast  02/12/2013   CLINICAL DATA:  History of atrial fibrillation, hypertension, hyperlipidemia, and previous stroke presenting with new onset left facial droop and left hemiparesis as well as left-sided numbness. Last known well at 6:30 a.m. today. Admitted 1 week ago for leg weakness of acute onset without MR evidence of acute stroke.  EXAM: MRI HEAD WITHOUT CONTRAST  TECHNIQUE: Multiplanar, multiecho pulse sequences of the brain and surrounding structures were obtained without intravenous contrast.  COMPARISON:  CT head 02/12/2013.  Brain MRI 02/06/2013.  FINDINGS: There is no evidence of acute infarct. Encephalomalacia is again seen at the site of old right corona radiata/lentiform nucleus infarct. There is moderate generalized cerebral atrophy. Confluent regions of periventricular T2 hyperintensity and small amount of T2 hyperintensity in the pons do not appear significantly changed and are consistent with moderate chronic small vessel ischemic disease. Remote lacunar infarcts are again noted in the right cerebellum. There is no evidence of intracranial hemorrhage, mass, midline shift, or extra-axial fluid collection. Major intracranial flow voids are  present. Prior bilateral cataract surgery is noted. Paranasal sinuses are clear.  IMPRESSION: 1. No evidence of acute infarct or other acute intracranial abnormality. 2. Unchanged appearance of old right corona radiata infarct and moderate chronic small vessel ischemic disease.   Electronically Signed   By: Sebastian Ache   On: 02/12/2013 10:11    EKG: Independently reviewed. No ST elevations or depressions, with atrial flutter  Assessment/Plan Active Problems:  1. Stroke like symptoms: Differential includes, TIA vs migraine HA vs seizure activity: Stroke less likely given negative MRI for acute stroke - EEG - MRI Without Contrast (as rec' by Neuro) - PT/OT consult - Neuro consult (done)  2. H/o CVA - continue xarelto and continue to monitor. - continue 2ary stroke prevention: statin, blood pressure control  3. Migraine HA - Continue excedrin.  4. Atrial fibrillation - xarelto for anticoagulation - rate controlled off B blocker or cardizem  Addendum 5. Hyperglycemia - Hgba1c - diabetic  - SSI sensitive  Code Status: FULL  Family Communication: discussed with daughters in room Disposition Plan: Greater than 2 nights  Time spent: > 60 minutes  Penny Pia [facility] Pager [phone]  If 7PM-7AM, please contact night-coverage [email or web address] Password [email or web address] 02/12/2013, 1:12 PM

## 2013-02-12 NOTE — Progress Notes (Signed)
Routine adult EEG completed. 

## 2013-02-12 NOTE — ED Provider Notes (Signed)
CSN: [id number]     Arrival date & time 02/12/13  4098 History   First MD Initiated Contact with Patient 02/12/13 [phone]     Chief Complaint  Patient presents with  . Code Stroke  . Loss of Consciousness    HPI Pt was seen at 0735. Per EMS, family and pt report, c/o sudden onset and persistence of constant LUE and LLE weakness that occurred this morning approx 0630. Pt states she was trying to walk down stairs when she felt her left side "get heavy." Pt's family states she sat down and had a brief syncopal episode. Family denies seizure activity, no incont of bowel/bladder, no confusion upon awakening. EMS called Code Stroke en route. Pt continues to c/o left sided paresthesias and weakness. No slurred speech, no SOB/cough, no CP/palpitations, no N/V/D, no abd pain.    Past Medical History  Diagnosis Date  . Arthritis   . Hypertension   . Migraine   . Colon polyp   . Hearing difficulty   . Torus palatinus   . Atrial flutter January, 2012  . Stroke 08/02/12     right lenticular nucleus infarct  . Left leg weakness 02/05/2013  . Chronic anticoagulation    Past Surgical History  Procedure Laterality Date  . Cataract extraction w/ intraocular lens  implant, bilateral  2006-2008  . Ganglion cyst excision Bilateral 1938,1954,2003,2005    "wrists/hand" (08/01/2012)  . Cardioversion  05/19/2010    Dr. Jacinto Halim  . Tonsillectomy  ~ 1935  . Appendectomy  02/19/53    `   Family History  Problem Relation Age of Onset  . Colon cancer    . Breast cancer    . Brain cancer    . Cancer Mother     breast  . Aneurysm Mother     brain  . Heart attack Neg Hx   . Diabetes Neg Hx   . Hypertension Neg Hx    History  Substance Use Topics  . Smoking status: Former Games developer  . Smokeless tobacco: Never Used  . Alcohol Use: 0.0 oz/week     Comment: 08/01/2012 "glass of wine on special occasions"    Review of Systems ROS: Statement: All systems negative except as marked or noted in the HPI;  Constitutional: Negative for fever and chills. ; ; Eyes: Negative for eye pain, redness and discharge. ; ; ENMT: Negative for ear pain, hoarseness, nasal congestion, sinus pressure and sore throat. ; ; Cardiovascular: Negative for chest pain, palpitations, diaphoresis, dyspnea and peripheral edema. ; ; Respiratory: Negative for cough, wheezing and stridor. ; ; Gastrointestinal: Negative for nausea, vomiting, diarrhea, abdominal pain, blood in stool, hematemesis, jaundice and rectal bleeding. . ; ; Genitourinary: Negative for dysuria, flank pain and hematuria. ; ; Musculoskeletal: Negative for back pain and neck pain. Negative for swelling and trauma.; ; Skin: Negative for pruritus, rash, abrasions, blisters, bruising and skin lesion.; ; Neuro: Negative for headache, lightheadedness and neck stiffness. Negative for weakness, altered level of consciousness , altered mental status, involuntary movement, seizure and +extremity weakness, paresthesias, syncope.       Allergies  Tramadol hcl and Alendronate sodium  Home Medications   Current Outpatient Rx  Name  Route  Sig  Dispense  Refill  . amLODipine (NORVASC) 2.5 MG tablet   Oral   Take 2.5-5 mg by mouth daily as needed. Takes 1 tablet if SBP >150; 2 tablets if SPB . 175 or DBP > 100.         Marland Kitchen  aspirin-acetaminophen-caffeine (EXCEDRIN MIGRAINE) 250-250-65 MG per tablet   Oral   Take 2 tablets by mouth daily as needed for pain (for migraines).         Marland Kitchen atorvastatin (LIPITOR) 40 MG tablet   Oral   Take 1 tablet (40 mg total) by mouth daily at 6 PM.   30 tablet   0   . midodrine (PROAMATINE) 5 MG tablet   Oral   Take 5 mg by mouth 3 (three) times daily as needed (for low BP). While awake for dizziness         . Multiple Vitamin (MULTIVITAMIN WITH MINERALS) TABS   Oral   Take 1 tablet by mouth daily.         . Rivaroxaban (XARELTO) 15 MG TABS tablet   Oral   Take 1 tablet (15 mg total) by mouth daily with supper.   30 tablet    3    BP 148/76  Pulse 83  Temp(Src) 97.8 F (36.6 C) (Oral)  Resp 17  Ht 5\' 7"  (1.702 m)  Wt 121 lb (54.885 kg)  BMI 18.95 kg/m2  SpO2 97% Physical Exam 0735: Physical examination:  Nursing notes reviewed; Vital signs and O2 SAT reviewed;  Constitutional: Well developed, Well nourished, In no acute distress; Head:  Normocephalic, atraumatic; Eyes: EOMI, PERRL, No scleral icterus; ENMT: Mouth and pharynx normal, Mucous membranes dry; Neck: Supple, Full range of motion, No lymphadenopathy; Cardiovascular: Regular rate and rhythm, No gallop; Respiratory: Breath sounds clear & equal bilaterally, No wheezes.  Speaking full sentences with ease, Normal respiratory effort/excursion; Chest: Nontender, Movement normal; Abdomen: Soft, Nontender, Nondistended, Normal bowel sounds; Genitourinary: No CVA tenderness; Extremities: Pulses normal, No tenderness, No edema, No calf edema or asymmetry.; Neuro: AA&Ox3, Major CN grossly intact. +left facial droop. Speech clear. +0/5 strength LLE, 3/5 strength LUE; 5/5 strength RUE and RLE. +subjective decreased sensation left face, LUE and LLE.; Skin: Color normal, Warm, Dry.   ED Course  Procedures   0745:  Neuro Dr. Roseanne Reno at bedside on pt's arrival to ED: code stroke cancelled due to pt on xarelto. Dr. Roseanne Reno requests to admit to medical service.  0830:  MRI brain pending. Left sided deficits continue. Pt now c/o acute flair of her chronic migraine headache; Neuro MD aware. Will tx for pain and nausea. T/C to [facility] Dr. Cena Benton, case discussed, including:  HPI, pertinent PM/SHx, VS/PE, dx testing, ED course and treatment:  Agreeable to admit, requests to write temporary orders, obtain tele bed to team 10.    EKG Interpretation     Ventricular Rate:  80 PR Interval:    QRS Duration: 86 QT Interval:  397 QTC Calculation: 458 R Axis:   28 Text Interpretation:  Atrial flutter Anteroseptal infarct, old No significant change since last tracing  02/05/2013            MDM  MDM Reviewed: previous chart, nursing note and vitals Reviewed previous: labs, ECG and MRI Interpretation: labs, ECG, x-ray and CT scan Total time providing critical care: 30-74 minutes. This excludes time spent performing separately reportable procedures and services. Consults: neurology and admitting MD   CRITICAL CARE Performed by: Laray Anger Total critical care time: 35 Critical care time was exclusive of separately billable procedures and treating other patients. Critical care was necessary to treat or prevent imminent or life-threatening deterioration. Critical care was time spent personally by me on the following activities: development of treatment plan with patient and/or surrogate as well as nursing, discussions  with consultants, evaluation of patient's response to treatment, examination of patient, obtaining history from patient or surrogate, ordering and performing treatments and interventions, ordering and review of laboratory studies, ordering and review of radiographic studies, pulse oximetry and re-evaluation of patient's condition.    Results for orders placed during the hospital encounter of 02/12/13  ETHANOL      Result Value Range   Alcohol, Ethyl (B) <11  0 - 11 mg/dL  PROTIME-INR      Result Value Range   Prothrombin Time 18.2 (*) 11.6 - 15.2 seconds   INR 1.55 (*) 0.00 - 1.49  APTT      Result Value Range   aPTT 33  24 - 37 seconds  CBC      Result Value Range   WBC 5.2  4.0 - 10.5 K/uL   RBC 4.55  3.87 - 5.11 MIL/uL   Hemoglobin 13.3  12.0 - 15.0 g/dL   HCT 16.1  09.6 - 04.5 %   MCV 90.1  78.0 - 100.0 fL   MCH 29.2  26.0 - 34.0 pg   MCHC 32.4  30.0 - 36.0 g/dL   RDW 40.9  81.1 - 91.4 %   Platelets 179  150 - 400 K/uL  DIFFERENTIAL      Result Value Range   Neutrophils Relative % 59  43 - 77 %   Neutro Abs 3.1  1.7 - 7.7 K/uL   Lymphocytes Relative 23  12 - 46 %   Lymphs Abs 1.2  0.7 - 4.0 K/uL    Monocytes Relative 13 (*) 3 - 12 %   Monocytes Absolute 0.7  0.1 - 1.0 K/uL   Eosinophils Relative 4  0 - 5 %   Eosinophils Absolute 0.2  0.0 - 0.7 K/uL   Basophils Relative 1  0 - 1 %   Basophils Absolute 0.1  0.0 - 0.1 K/uL  COMPREHENSIVE METABOLIC PANEL      Result Value Range   Sodium 139  135 - 145 mEq/L   Potassium 3.8  3.5 - 5.1 mEq/L   Chloride 104  96 - 112 mEq/L   CO2 24  19 - 32 mEq/L   Glucose, Bld 168 (*) 70 - 99 mg/dL   BUN 13  6 - 23 mg/dL   Creatinine, Ser 7.82  0.50 - 1.10 mg/dL   Calcium 8.9  8.4 - 95.6 mg/dL   Total Protein 6.9  6.0 - 8.3 g/dL   Albumin 3.4 (*) 3.5 - 5.2 g/dL   AST 25  0 - 37 U/L   ALT 14  0 - 35 U/L   Alkaline Phosphatase 81  39 - 117 U/L   Total Bilirubin 0.6  0.3 - 1.2 mg/dL   GFR calc non Af Amer 51 (*) >90 mL/min   GFR calc Af Amer 59 (*) >90 mL/min  TROPONIN I      Result Value Range   Troponin I <0.30  <0.30 ng/mL  GLUCOSE, CAPILLARY      Result Value Range   Glucose-Capillary 132 (*) 70 - 99 mg/dL  POCT I-STAT, CHEM 8      Result Value Range   Sodium 142  135 - 145 mEq/L   Potassium 3.8  3.5 - 5.1 mEq/L   Chloride 104  96 - 112 mEq/L   BUN 14  6 - 23 mg/dL   Creatinine, Ser 2.13 (*) 0.50 - 1.10 mg/dL   Glucose, Bld 086 (*) 70 - 99 mg/dL  Calcium, Ion 1.16  1.13 - 1.30 mmol/L   TCO2 25  0 - 100 mmol/L   Hemoglobin 15.3 (*) 12.0 - 15.0 g/dL   HCT 16.1  09.6 - 04.5 %  POCT I-STAT TROPONIN I      Result Value Range   Troponin i, poc 0.01  0.00 - 0.08 ng/mL   Comment 3            Dg Chest 2 View 02/05/2013   CLINICAL DATA:  Stroke.  EXAM: CHEST  2 VIEW  COMPARISON:  07/02/2010.  FINDINGS: The heart remains normal in size. The lungs remain clear and hyperexpanded. Thoracic spine degenerative changes. Diffuse osteopenia.  IMPRESSION: Stable changes of COPD. No acute abnormality.   Electronically Signed   By: Gordan Payment M.D.   On: 02/05/2013 22:56   Ct Head Wo Contrast 02/12/2013   CLINICAL DATA:  Left-sided weakness  EXAM: CT  HEAD WITHOUT CONTRAST  TECHNIQUE: Contiguous axial images were obtained from the base of the skull through the vertex without intravenous contrast.  COMPARISON:  Brain MRI 02/06/2013 and head CT scan 02/05/2013.  FINDINGS: Extensive chronic microvascular ischemic change is again seen with a remote corona radiata infarct on the right identified. No evidence of acute abnormality including infarction, hemorrhage, mass lesion, mass effect, midline shift abnormal extra-axial fluid collection is identified. There is no hydrocephalus or pneumocephalus. The calvarium is intact.  IMPRESSION: No acute finding.  Atrophy, chronic microvascular ischemic change and remote corona radiata infarct, unchanged.  Critical Value/emergent results were called by telephone at the time of interpretation on 02/12/2013 at 7:41 AM to Dr.Stewart , who verbally acknowledged these results.   Electronically Signed   By: Drusilla Kanner M.D.   On: 02/12/2013 07:41     Laray Anger, DO 02/14/13 2034

## 2013-02-12 NOTE — ED Notes (Signed)
Pt also c/o h/a enroute to ED, pain located behind both eyes. States has Hx migraines & has felt this type of pain before.

## 2013-02-12 NOTE — ED Notes (Signed)
Returned from Ct scan with Stroke team nurse & Dr. Roseanne Reno

## 2013-02-12 NOTE — ED Notes (Signed)
Awoke 0630 went to bathroom, upon going down stairs c/o left side weakness. Pt reports had a TIA a week ago.

## 2013-02-12 NOTE — Consult Note (Addendum)
Referring Physician: Dr. Clarene Duke    Chief Complaint: Acute recurrent left-sided weakness and numbness.  HPI: [name] is an 79 y.o. female history of atrial fibrillation, hypertension, hyperlipidemia and previous stroke presenting with new onset left facial droop and left hemiparesis as well as numbness on the left side. Patient was last known well at 6:30 AM today. She's been on Xarelto for anticoagulation. She was admitted here one week ago for leg weakness of acute onset. MRI showed no evidence of recurrent acute stroke. NIH stroke scale today was 9. Exam findings indicated probable subcortical right recurrent small vessel TIA or infarction. She was medicated today for TPA because she second Xarelto. She was also felt to not be a candidate for interventional radiology because deficits were indicative of likely subcortical small vessel involvement with no indications of proximal MCA or ICA occlusion. NIH stroke score was 9.  LSN: 6:30 AM on 02/12/2013 tPA Given: No: On Xarelto MRankin: 2  Past Medical History  Diagnosis Date  . Arthritis   . Hypertension   . Migraine   . Colon polyp   . Hearing difficulty   . Torus palatinus   . Atrial flutter January, 2012  . Stroke 08/02/12     right lenticular nucleus infarct  . Left leg weakness 02/05/2013  . Chronic anticoagulation     Family History  Problem Relation Age of Onset  . Colon cancer    . Breast cancer    . Brain cancer    . Cancer Mother     breast  . Aneurysm Mother     brain  . Heart attack Neg Hx   . Diabetes Neg Hx   . Hypertension Neg Hx      Medications: I have reviewed the patient's current medications.  ROS: History obtained from the patient  General ROS: negative for - chills, fatigue, fever, night sweats, weight gain or weight loss Psychological ROS: negative for - behavioral disorder, hallucinations, memory difficulties, mood swings or suicidal ideation Ophthalmic ROS: negative for - blurry vision,  double vision, eye pain or loss of vision ENT ROS: negative for - epistaxis, nasal discharge, oral lesions, sore throat, tinnitus or vertigo Allergy and Immunology ROS: negative for - hives or itchy/watery eyes Hematological and Lymphatic ROS: negative for - bleeding problems, bruising or swollen lymph nodes Endocrine ROS: negative for - galactorrhea, hair pattern changes, polydipsia/polyuria or temperature intolerance Respiratory ROS: negative for - cough, hemoptysis, shortness of breath or wheezing Cardiovascular ROS: negative for - chest pain, dyspnea on exertion, edema or irregular heartbeat Gastrointestinal ROS: negative for - abdominal pain, diarrhea, hematemesis, nausea/vomiting or stool incontinence Genito-Urinary ROS: negative for - dysuria, hematuria, incontinence or urinary frequency/urgency Musculoskeletal ROS: negative for - joint swelling or muscular weakness Neurological ROS: as noted in HPI Dermatological ROS: negative for rash and skin lesion changes  Physical Examination: SpO2 99.00%.  Neurologic Examination: Mental Status: Alert, oriented to correct month but not correct age.  Speech fluent without evidence of aphasia. Able to follow commands without difficulty. Cranial Nerves: II-Visual fields were normal. III/IV/VI-Pupils were equal and reacted. Extraocular movements were full and conjugate.    V/VII-no facial numbness; slight left lower facial weakness. VIII-normal. X-normal speech. Motor: Severe weakness proximally and distally of left upper extremity as well as left lower extremity; normal strength of right extremities; flaccid muscle tone throughout. Sensory: Reduced perception of tactile sensation over left extremities compared to right extremities. Deep Tendon Reflexes: 2+ and symmetric. Plantars: Mute bilaterally Cerebellar: Normal  finger-to-nose testing with use of right upper extremity; unable to perform with left upper extremity.  Ct Head Wo  Contrast  02/12/2013   CLINICAL DATA:  Left-sided weakness  EXAM: CT HEAD WITHOUT CONTRAST  TECHNIQUE: Contiguous axial images were obtained from the base of the skull through the vertex without intravenous contrast.  COMPARISON:  Brain MRI 02/06/2013 and head CT scan 02/05/2013.  FINDINGS: Extensive chronic microvascular ischemic change is again seen with a remote corona radiata infarct on the right identified. No evidence of acute abnormality including infarction, hemorrhage, mass lesion, mass effect, midline shift abnormal extra-axial fluid collection is identified. There is no hydrocephalus or pneumocephalus. The calvarium is intact.  IMPRESSION: No acute finding.  Atrophy, chronic microvascular ischemic change and remote corona radiata infarct, unchanged.  Critical Value/emergent results were called by telephone at the time of interpretation on 02/12/2013 at 7:41 AM to Dr.Wilmarie Sparlin , who verbally acknowledged these results.   Electronically Signed   By: Drusilla Kanner M.D.   On: 02/12/2013 07:41    Assessment: 79 y.o. female with multiple risk factors for stroke as well as previous stroke, and on anticoagulation, presenting with recurrent weakness and numbness involving left side, likely manifestations of recurrent right subcortical TIA or ischemic stroke.  Stroke Risk Factors - atrial fibrillation, hyperlipidemia and hypertension  Plan: 1. MRI of the brain without contrast 2. PT consult, OT consult 3. Prophylactic therapy-Xarelto 4. Telemetry monitoring   C.R. Roseanne Reno, MD [facility] [phone]   02/12/2013, 8:05 AM

## 2013-02-12 NOTE — Procedures (Signed)
ELECTROENCEPHALOGRAM REPORT   Patient: [name]       Room #: 1O10 EEG No. ID:  Age: 79 y.o.        Sex: female Referring Physician: Cena Benton Report Date:  02/12/2013        Interpreting Physician: Aline Brochure  History: [name] is an 79 y.o. female with atrial fibrillation on anticoagulation, presenting with recurrent weakness and numbness involving left extremities.  Indications for study:  Rule out focal seizure disorder.  Technique: This is an 18 channel routine scalp EEG performed at the bedside with bipolar and monopolar montages arranged in accordance to the international 10/20 system of electrode placement.   Description: This EEG recording was performed during wakefulness. Predominate background activity consists of 10 Hz symmetrical alpha rhythm which attenuates well with eye opening. Photic stimulation was not performed. Hyperventilation was not performed. No epileptiform discharges were recorded. There was no abnormal slowing.  Interpretation: This is a normal EEG recording during wakefulness. No evidence of an epileptic disorder was demonstrated.   Venetia Maxon M.D. [facility] [phone]

## 2013-02-13 ENCOUNTER — Inpatient Hospital Stay (HOSPITAL_COMMUNITY): Payer: Medicare Other

## 2013-02-13 DIAGNOSIS — R29898 Other symptoms and signs involving the musculoskeletal system: Secondary | ICD-10-CM

## 2013-02-13 LAB — BASIC METABOLIC PANEL
BUN: 15 mg/dL (ref 6–23)
Calcium: 8.7 mg/dL (ref 8.4–10.5)
Creatinine, Ser: 0.83 mg/dL (ref 0.50–1.10)
GFR calc non Af Amer: 62 mL/min — ABNORMAL LOW (ref >90)
Glucose, Bld: 83 mg/dL (ref 70–99)
Potassium: 4.2 mEq/L (ref 3.5–5.1)

## 2013-02-13 LAB — CBC
HCT: 38 % (ref 36.0–46.0)
MCH: 29.9 pg (ref 26.0–34.0)
MCHC: 33.2 g/dL (ref 30.0–36.0)
MCV: 90 fL (ref 78.0–100.0)
RDW: 13.6 % (ref 11.5–15.5)
WBC: 5.7 10*3/uL (ref 4.0–10.5)

## 2013-02-13 LAB — GLUCOSE, CAPILLARY
Glucose-Capillary: 91 mg/dL (ref 70–99)
Glucose-Capillary: 97 mg/dL (ref 70–99)

## 2013-02-13 NOTE — Progress Notes (Signed)
UR complete.  Dajaun Goldring RN, MSN 

## 2013-02-13 NOTE — Progress Notes (Signed)
[facility] PROGRESS NOTE  [name] UJW:[id number] DOB: 12-Aug-1924 DOA: 02/12/2013 PCP: Lemont Fillers., NP  Assessment/Plan: 1. Stroke-like symptoms -Neuro continues to follow -PT/OT consult -Continue Xarelto  2. Atrial Fibrillation -Continue Xarelto  3. Chronic anticoagulation -continue Xarelto  4. Hypertension -continue Norvasc - Blood pressure relatively well controlled.  5. Hyperglycemia -sliding scale insulin with AC/HS CBG's - Hgb 5.9 therefore pre diabetic range. Would plan on controlling with diabetic diet on discharge.  6. Migraines -Continue Excedrin PRN (effective at home for years)  Code Status: FULL Family Communication: Daughters (indicate person spoken with, relationship, and if by phone, the number) Disposition Plan: Based on recommendations from neurologist and PT   Consultants:  Neurology  Procedures:  EKG: Personally reviewed: Atrial Flutter  Antibiotics:  None  HPI/Subjective: 79yo female presented to the ED on the morning of 02/12/13 with sudden onset of left-sided weakness, slurred speech, incontinence, and syncope. Pt was admitted for left-leg weakness on 02/05/13 with MRI/CT negative at that time for CVA. Hx of CVA in May 2014. Yesterday, pt c/o mild headache and nausea; now resolved. Today, pt unable to move left leg, whereas yesterday, she was able to wiggle her toes slightly. Pt also reports change in sensation and that her leg feels slightly numb and sensitive to touch. Current MRI/CT is negative for Acute Infract. EEG unremarkable. Neuro will continue to follow. PT/OT on board to assess needs.  Objective: Filed Vitals:   02/13/13 1035  BP: 115/57  Pulse: 57  Temp: 97.4 F (36.3 C)  Resp: 20    Intake/Output Summary (Last 24 hours) at 02/13/13 1113 Last data filed at 02/12/13 2307  Gross per 24 hour  Intake      3 ml  Output    150 ml  Net   -147 ml   Filed Weights   02/12/13 0748  Weight: 54.885 kg (121  lb)    Exam:   General:  Awake, Alert, Oriented x4, NAD  Cardiovascular: irregularly irregular pulse, no MRG  Respiratory: CTA BL, no wheezes  Abdomen: NT, ND, BSA x4  Musculoskeletal: FROM BL upper extremities and RL extremity, except L leg is flaccid with decreased sensation  Data Reviewed: Basic Metabolic Panel:  Recent Labs Lab 02/12/13 0730 02/12/13 0733 02/13/13 0555  NA 139 142 140  K 3.8 3.8 4.2  CL 104 104 108  CO2 24  --  21  GLUCOSE 168* 171* 83  BUN 13 14 15   CREATININE 0.98 1.20* 0.83  CALCIUM 8.9  --  8.7   Liver Function Tests:  Recent Labs Lab 02/12/13 0730  AST 25  ALT 14  ALKPHOS 81  BILITOT 0.6  PROT 6.9  ALBUMIN 3.4*   No results found for this basename: LIPASE, AMYLASE,  in the last 168 hours No results found for this basename: AMMONIA,  in the last 168 hours CBC:  Recent Labs Lab 02/12/13 0730 02/12/13 0733 02/13/13 0905  WBC 5.2  --  5.7  NEUTROABS 3.1  --   --   HGB 13.3 15.3* 12.6  HCT 41.0 45.0 38.0  MCV 90.1  --  90.0  PLT 179  --  190   Cardiac Enzymes:  Recent Labs Lab 02/12/13 0730  TROPONINI <0.30   BNP (last 3 results) No results found for this basename: PROBNP,  in the last 8760 hours CBG:  Recent Labs Lab 02/12/13 0820 02/12/13 2308 02/13/13 0636  GLUCAP 132* 101* 85    Recent Results (from the past 240 hour(s))  URINE CULTURE     Status: None   Collection Time    02/05/13  9:26 PM      Result Value Range Status   Specimen Description URINE, CLEAN CATCH   Final   Special Requests CX ADDED AT 2151 ON [id number]   Final   Culture  Setup Time     Final   Value: 02/06/2013 01:12     Performed at [facility]   Culture     Final   Value: Multiple bacterial morphotypes present, none predominant. Suggest appropriate recollection if clinically indicated.     Performed at [facility]   Report Status 02/06/2013 FINAL   Final     Studies: Ct Head Wo Contrast  02/12/2013   CLINICAL  DATA:  Left-sided weakness  EXAM: CT HEAD WITHOUT CONTRAST  TECHNIQUE: Contiguous axial images were obtained from the base of the skull through the vertex without intravenous contrast.  COMPARISON:  Brain MRI 02/06/2013 and head CT scan 02/05/2013.  FINDINGS: Extensive chronic microvascular ischemic change is again seen with a remote corona radiata infarct on the right identified. No evidence of acute abnormality including infarction, hemorrhage, mass lesion, mass effect, midline shift abnormal extra-axial fluid collection is identified. There is no hydrocephalus or pneumocephalus. The calvarium is intact.  IMPRESSION: No acute finding.  Atrophy, chronic microvascular ischemic change and remote corona radiata infarct, unchanged.  Critical Value/emergent results were called by telephone at the time of interpretation on 02/12/2013 at 7:41 AM to Dr.Stewart , who verbally acknowledged these results.   Electronically Signed   By: Drusilla Kanner M.D.   On: 02/12/2013 07:41   Mr Brain Wo Contrast  02/12/2013   CLINICAL DATA:  History of atrial fibrillation, hypertension, hyperlipidemia, and previous stroke presenting with new onset left facial droop and left hemiparesis as well as left-sided numbness. Last known well at 6:30 a.m. today. Admitted 1 week ago for leg weakness of acute onset without MR evidence of acute stroke.  EXAM: MRI HEAD WITHOUT CONTRAST  TECHNIQUE: Multiplanar, multiecho pulse sequences of the brain and surrounding structures were obtained without intravenous contrast.  COMPARISON:  CT head 02/12/2013.  Brain MRI 02/06/2013.  FINDINGS: There is no evidence of acute infarct. Encephalomalacia is again seen at the site of old right corona radiata/lentiform nucleus infarct. There is moderate generalized cerebral atrophy. Confluent regions of periventricular T2 hyperintensity and small amount of T2 hyperintensity in the pons do not appear significantly changed and are consistent with moderate chronic  small vessel ischemic disease. Remote lacunar infarcts are again noted in the right cerebellum. There is no evidence of intracranial hemorrhage, mass, midline shift, or extra-axial fluid collection. Major intracranial flow voids are present. Prior bilateral cataract surgery is noted. Paranasal sinuses are clear.  IMPRESSION: 1. No evidence of acute infarct or other acute intracranial abnormality. 2. Unchanged appearance of old right corona radiata infarct and moderate chronic small vessel ischemic disease.   Electronically Signed   By: Sebastian Ache   On: 02/12/2013 10:11    Scheduled Meds: . amLODipine  2.5 mg Oral Daily  . atorvastatin  40 mg Oral q1800  . insulin aspart  0-9 Units Subcutaneous TID WC  . multivitamin with minerals  1 tablet Oral Daily  . Rivaroxaban  15 mg Oral Q supper  . sodium chloride  3 mL Intravenous Q12H   Continuous Infusions:   Principal Problem:   Stroke-like symptom Active Problems:   MIGRAINE HEADACHE   CVA (cerebral infarction)  HTN (hypertension)   Left leg weakness   Atrial fibrillation   Chronic anticoagulation   TIA (transient ischemic attack)   Hyperglycemia    Time spent: >60 minutes    Penny Pia  [facility] Pager [phone]  If 7PM-7AM, please contact night-coverage at [email or web address], password [facility] 02/13/2013, 11:13 AM  LOS: 1 day

## 2013-02-13 NOTE — Progress Notes (Signed)
Rehab Admissions Coordinator Note:  Patient was screened by Clois Dupes for appropriateness for an Inpatient Acute Rehab Consult.  At this time, we are recommending Inpatient Rehab consult. I will contact Dr. Ronnette Hila, Foye Spurling 02/13/2013, 4:45 PM  I can be reached at [phone].

## 2013-02-13 NOTE — Progress Notes (Signed)
Stroke Team Progress Note  HISTORY [name] is an 79 y.o. female history of atrial fibrillation, hypertension, hyperlipidemia and previous stroke presenting with new onset left facial droop and left hemiparesis as well as numbness on the left side. Patient was last known well at 6:30 AM today 02/12/2013. She's been on Xarelto for anticoagulation. She was admitted here one week ago for leg weakness of acute onset. MRI showed no evidence of recurrent acute stroke. NIH stroke scale today was 9. Exam findings indicated probable subcortical right recurrent small vessel TIA or infarction. Patient was not a TPA candidate secondary to being on xarelto. She was also felt to not be a candidate for interventional radiology because deficits were indicative of likely subcortical small vessel involvement with no indications of proximal MCA or ICA occlusion. NIH stroke score was 9.  She was admitted for further evaluation and treatment.  SUBJECTIVE Her daughter and family are at the bedside.  Overall she feels her condition is improved. She lives at home with her dtr; performs own ADLs. Left arm is improved, left leg is still flaccid per pt.  OBJECTIVE Most recent Vital Signs: Filed Vitals:   02/12/13 2310 02/13/13 0635 02/13/13 0957 02/13/13 1035  BP: 158/59 125/57 116/52 115/57  Pulse: 64 52  57  Temp: 98.3 F (36.8 C) 98.1 F (36.7 C)  97.4 F (36.3 C)  TempSrc: Oral Oral  Oral  Resp: 20 20  20   Height:      Weight:      SpO2: 98% 96%  97%   CBG (last 3)   Recent Labs  02/12/13 2308 02/13/13 0636 02/13/13 1129  GLUCAP 101* 85 91    IV Fluid Intake:     MEDICATIONS  . amLODipine  2.5 mg Oral Daily  . atorvastatin  40 mg Oral q1800  . insulin aspart  0-9 Units Subcutaneous TID WC  . multivitamin with minerals  1 tablet Oral Daily  . Rivaroxaban  15 mg Oral Q supper  . sodium chloride  3 mL Intravenous Q12H   PRN:  aspirin-acetaminophen-caffeine  Diet:  Carb Control thin  liquids Activity:   DVT Prophylaxis:  xarelto  CLINICALLY SIGNIFICANT STUDIES Basic Metabolic Panel:   Recent Labs Lab 02/12/13 0730 02/12/13 0733 02/13/13 0555  NA 139 142 140  K 3.8 3.8 4.2  CL 104 104 108  CO2 24  --  21  GLUCOSE 168* 171* 83  BUN 13 14 15   CREATININE 0.98 1.20* 0.83  CALCIUM 8.9  --  8.7   Liver Function Tests:   Recent Labs Lab 02/12/13 0730  AST 25  ALT 14  ALKPHOS 81  BILITOT 0.6  PROT 6.9  ALBUMIN 3.4*   CBC:   Recent Labs Lab 02/12/13 0730 02/12/13 0733 02/13/13 0905  WBC 5.2  --  5.7  NEUTROABS 3.1  --   --   HGB 13.3 15.3* 12.6  HCT 41.0 45.0 38.0  MCV 90.1  --  90.0  PLT 179  --  190   Coagulation:   Recent Labs Lab 02/12/13 0730  LABPROT 18.2*  INR 1.55*   Cardiac Enzymes:   Recent Labs Lab 02/12/13 0730  TROPONINI <0.30   Urinalysis:   Recent Labs Lab 02/12/13 1046  COLORURINE YELLOW  LABSPEC 1.014  PHURINE 7.5  GLUCOSEU NEGATIVE  HGBUR NEGATIVE  BILIRUBINUR NEGATIVE  KETONESUR NEGATIVE  PROTEINUR NEGATIVE  UROBILINOGEN 0.2  NITRITE NEGATIVE  LEUKOCYTESUR NEGATIVE   Lipid Panel    Component Value  Date/Time   CHOL 170 02/06/2013 0525   TRIG 90 02/06/2013 0525   HDL 58 02/06/2013 0525   CHOLHDL 2.9 02/06/2013 0525   VLDL 18 02/06/2013 0525   LDLCALC 94 02/06/2013 0525   HgbA1C  Lab Results  Component Value Date   HGBA1C 5.9* 02/12/2013    Urine Drug Screen:     Component Value Date/Time   LABOPIA NONE DETECTED 02/12/2013 1046   COCAINSCRNUR NONE DETECTED 02/12/2013 1046   LABBENZ NONE DETECTED 02/12/2013 1046   AMPHETMU NONE DETECTED 02/12/2013 1046   THCU NONE DETECTED 02/12/2013 1046   LABBARB NONE DETECTED 02/12/2013 1046    Alcohol Level:   Recent Labs Lab 02/12/13 0730  ETH <11   CT of the brain  02/12/2013   No acute finding.  Atrophy, chronic microvascular ischemic change and remote corona radiata infarct, unchanged.    MRI of the brain  02/12/2013    1. No evidence of  acute infarct or other acute intracranial abnormality. 2. Unchanged appearance of old right corona radiata infarct and moderate chronic small vessel ischemic disease.     EKG  unchanged from previous tracings, atrial flutter, rate 80.   EEG  02/12/2013  This is a normal EEG recording during wakefulness. No evidence of an epileptic disorder was demonstrated.  Therapy Recommendations   Physical Exam   GENERAL EXAM: Patient is in no distress  CARDIOVASCULAR: Regular rate and rhythm, no murmurs, no carotid bruits  NEUROLOGIC: MENTAL STATUS: awake, alert, language fluent, comprehension intact, naming intact CRANIAL NERVE: pupils equal and reactive to light, visual fields full to confrontation, extraocular muscles intact, no nystagmus, facial sensation DECR ON LEFT. LEFT NL FOLD DECR. Uvula midline, shoulder shrug symmetric, tongue midline. MOTOR: RUE AND RLE 5. LUE 4. LLE (NO MOVEMENT ON VOLUNTARY EFFORT; 1-2 ON PLANTAR STIM).  SENSORY: DECR IN LEFT ARM AND LEG. COORDINATION: finger-nose-finger normal REFLEXES: BUE 2, RLE 1, LLE 0 GAIT/STATION: LAYING IN BED   ASSESSMENT [name] is a 79 y.o. female presenting with recurrent weakness and numbness of left side, LOC and incontinence in setting of headache. Imaging confirms no acute infarct.   Dx:  Left leg hemiplegia not associated with a new or old stroke. On xarelto prior to admission. Now on xarelto for secondary stroke prevention. Patient with resultant left UE hemiparesis (improved, almost back to baseline), and left LE hemiplegia. Work up underway.  Hx right lenticular nucleus infarct 08/02/2012, embolic secondary to afib Recent stroke admission last week, Feb 05, 2013 where MRI neg for acute stroke, classified as TIA, negative for UTI, no sz workup  Hx migraine, headache in ambulance on the way atrial fibrillation  Hypertension Hyperlipidemia, LDL 94, on lipitor 40 PTA, now on lipitor 40, goal LDL < 100 (< 70 for diabetics)    Hospital day # 1  TREATMENT/PLAN  Continue xarelto for secondary stroke prevention.  MRI of the lumbar spine  Annie Main, MSN, RN, ANVP-BC, ANP-BC, GNP-BC [facility] Pager: [phone] 02/13/2013 11:41 AM  I have personally obtained a history, examined the patient, evaluated imaging results, and formulated the assessment and plan of care. I agree with the above. Her left leg weakness is out of proportion to her left face/arm findings. MRI negative for new stroke. Could be lumbar radiculopathy / peripheral neuropathy. Will check MRI lumbar spine.  Suanne Marker, MD 02/13/2013, 4:20 PM Certified in Neurology, Neurophysiology and Neuroimaging [facility] - Stroke Team  Please refer to [email or web address] for on-call  Stroke MD

## 2013-02-13 NOTE — Evaluation (Signed)
Physical Therapy Evaluation Patient Details Name: [name] MRN: [id number] DOB: Sep 04, 1924 Today's Date: 02/13/2013 Time: 4098-1191 PT Time Calculation (min): 25 min  PT Assessment / Plan / Recommendation History of Present Illness  [name] is an 79 y.o. female history of atrial fibrillation, hypertension, hyperlipidemia and previous stroke presenting with new onset left facial droop and left hemiparesis as well as numbness on the left side. Patient was last known well at 6:30 AM today 02/12/2013. She's been on Xarelto for anticoagulation. She was admitted here one week ago for leg weakness of acute onset. MRI showed no evidence of recurrent acute stroke. NIH stroke scale today was 9. Exam findings indicated probable subcortical right recurrent small vessel TIA or infarction. Patient was not a TPA candidate secondary to being on xarelto. She was also felt to not be a candidate for interventional radiology because deficits were indicative of likely subcortical small vessel involvement with no indications of proximal MCA or ICA occlusion. NIH stroke score was 9.  She was admitted for further evaluation and treatment.  Clinical Impression  Patient demonstrates deficits in functional mobility as indicated below. Patient PTA was ambulatory and able to remain functional with supervision, patient currently with L sides weakness LUE LLE and trunk. Pt with deficits in mobility as indicated below. Patient will need continued skilled PT to address deficits and maximize function. Rec CIR upon discharge.Will see as indicated and progress as tolerated.    PT Assessment  Patient needs continued PT services    Follow Up Recommendations  CIR;Supervision/Assistance - 24 hour    Does the patient have the potential to tolerate intense rehabilitation      Barriers to Discharge        Equipment Recommendations   (TBD)    Recommendations for Other Services     Frequency Min 4X/week    Precautions /  Restrictions Precautions Precautions: Fall Restrictions Weight Bearing Restrictions: No   Pertinent Vitals/Pain No pain reported at this time      Mobility  Bed Mobility Bed Mobility: Rolling Right;Right Sidelying to Sit;Sitting - Scoot to [place] of Bed;Sit to Sidelying Right Rolling Right: 3: Mod assist Right Sidelying to Sit: 3: Mod assist Supine to Sit: 3: Mod assist Sit to Supine: 3: Mod assist Sit to Sidelying Right: 3: Mod assist Details for Bed Mobility Assistance: Assist for LLE and trunk, assist to rotate hips to EOB Transfers Transfers: Sit to Stand;Stand to Sit;Stand Pivot Transfers Sit to Stand: 3: Mod assist;From bed;From chair/3-in-1 Stand to Sit: 3: Mod assist Stand Pivot Transfers: 1: +1 Total assist Details for Transfer Assistance: Face to face, patient able to push up through RLE but unable to transfer weight through LLE or use LLE for support Ambulation/Gait Ambulation/Gait Assistance: Not tested (comment) Modified Rankin (Stroke Patients Only) Pre-Morbid Rankin Score: Slight disability Modified Rankin: Severe disability    Exercises     PT Diagnosis: Difficulty walking;Generalized weakness  PT Problem List: Decreased strength;Decreased activity tolerance;Decreased balance;Decreased mobility PT Treatment Interventions: DME instruction;Gait training;Stair training;Functional mobility training;Therapeutic activities;Therapeutic exercise;Balance training     PT Goals(Current goals can be found in the care plan section) Acute Rehab PT Goals Patient Stated Goal: to be able to walk again PT Goal Formulation: With patient Time For Goal Achievement: 02/13/13 Potential to Achieve Goals: Good  Visit Information  Last PT Received On: 02/13/13 Assistance Needed: +2 History of Present Illness: [name] is an 79 y.o. female history of atrial fibrillation, hypertension, hyperlipidemia and previous stroke  presenting with new onset left facial droop and left  hemiparesis as well as numbness on the left side. Patient was last known well at 6:30 AM today 02/12/2013. She's been on Xarelto for anticoagulation. She was admitted here one week ago for leg weakness of acute onset. MRI showed no evidence of recurrent acute stroke. NIH stroke scale today was 9. Exam findings indicated probable subcortical right recurrent small vessel TIA or infarction. Patient was not a TPA candidate secondary to being on xarelto. She was also felt to not be a candidate for interventional radiology because deficits were indicative of likely subcortical small vessel involvement with no indications of proximal MCA or ICA occlusion. NIH stroke score was 9.  She was admitted for further evaluation and treatment.       Prior Functioning  Home Living Family/patient expects to be discharged to:: Private residence Living Arrangements: Children Available Help at Discharge: Family;Available PRN/intermittently (all but 4-5 hours a day) Type of Home: House Home Access: Stairs to enter [employer] of Steps: 1 Entrance Stairs-Rails: None Home Layout: Two level;Bed/bath upstairs Alternate Level Stairs-Number of Steps: 15 Alternate Level Stairs-Rails: Right;Left Home Equipment: Walker - 2 wheels;Shower seat;Transport chair;Grab bars - tub/shower Additional Comments: pt not wanting SNF-daughters report they can figure out 24/7 assistance Prior Function Comments: daughters provided supervision for bathing and assisted with giong up and down stairs Dominant Hand: Right    Cognition  Cognition Arousal/Alertness: Awake/alert Behavior During Therapy: WFL for tasks assessed/performed Overall Cognitive Status: Within Functional Limits for tasks assessed    Extremity/Trunk Assessment Lower Extremity Assessment Lower Extremity Assessment: LLE deficits/detail LLE Deficits / Details: trace muscle activation, no discernable strength (flaccid) Cervical / Trunk Assessment Cervical /  Trunk Assessment: Normal   Balance Static Sitting Balance Static Sitting - Balance Support: Feet supported Static Sitting - Level of Assistance: 4: Min assist;3: Mod assist Static Sitting - Comment/# of Minutes: Poor trunk control with lateral lean to the left, able to correct with cues and min assist Dynamic Sitting Balance Dynamic Sitting - Balance Support: Feet supported;During functional activity Dynamic Sitting - Level of Assistance: 4: Min assist;3: Mod assist Dynamic Sitting - Balance Activities: Lateral lean/weight shifting;Forward lean/weight shifting;Reaching for objects;Reaching across midline;Trunk control activities Dynamic Sitting - Comments: Patient with poor ability to maintain midline, multiple EOB trunk activities performed with patient, patient demonstrated improvements in correction techniques as we continued to progress through activities  End of Session PT - End of Session Equipment Utilized During Treatment: Gait belt Activity Tolerance: Patient tolerated treatment well Patient left: with family/visitor present;with call bell/phone within reach;with bed alarm set;in bed Nurse Communication: Mobility status  GP     Fabio Asa 02/13/2013, 4:34 PM Charlotte Crumb, PT DPT  [phone]

## 2013-02-14 ENCOUNTER — Ambulatory Visit: Payer: Medicare Other

## 2013-02-14 ENCOUNTER — Ambulatory Visit: Payer: Medicare Other | Admitting: Occupational Therapy

## 2013-02-14 DIAGNOSIS — R7309 Other abnormal glucose: Secondary | ICD-10-CM

## 2013-02-14 DIAGNOSIS — R569 Unspecified convulsions: Secondary | ICD-10-CM | POA: Diagnosis present

## 2013-02-14 DIAGNOSIS — G43109 Migraine with aura, not intractable, without status migrainosus: Secondary | ICD-10-CM

## 2013-02-14 DIAGNOSIS — I10 Essential (primary) hypertension: Secondary | ICD-10-CM | POA: Diagnosis not present

## 2013-02-14 DIAGNOSIS — G43909 Migraine, unspecified, not intractable, without status migrainosus: Secondary | ICD-10-CM

## 2013-02-14 DIAGNOSIS — Z7901 Long term (current) use of anticoagulants: Secondary | ICD-10-CM | POA: Diagnosis not present

## 2013-02-14 DIAGNOSIS — I6330 Cerebral infarction due to thrombosis of unspecified cerebral artery: Secondary | ICD-10-CM

## 2013-02-14 DIAGNOSIS — E7800 Pure hypercholesterolemia, unspecified: Secondary | ICD-10-CM | POA: Diagnosis not present

## 2013-02-14 DIAGNOSIS — I4891 Unspecified atrial fibrillation: Secondary | ICD-10-CM | POA: Diagnosis not present

## 2013-02-14 LAB — GLUCOSE, CAPILLARY: Glucose-Capillary: 106 mg/dL — ABNORMAL HIGH (ref 70–99)

## 2013-02-14 MED ORDER — DIVALPROEX SODIUM ER 250 MG PO TB24
250.0000 mg | ORAL_TABLET | Freq: Every day | ORAL | Status: DC
  Administered 2013-02-14 – 2013-02-15 (×2): 250 mg via ORAL
  Filled 2013-02-14 (×2): qty 1

## 2013-02-14 NOTE — Progress Notes (Signed)
[facility]                                                                                Patient Demographics  [name], is a 79 y.o. female, DOB - 08/31/1924, ZOX:[id number]  Admit date - 02/12/2013   Admitting Physician Penny Pia, MD  Outpatient Primary MD for the patient is Lemont Fillers., NP  LOS - 2   Chief Complaint  Patient presents with  . Code Stroke  . Loss of Consciousness        Assessment & Plan    Principal Problem:   Stroke-like symptom Active Problems:   MIGRAINE HEADACHE   right brain stroke too small to be seen on MRI   HTN (hypertension)   Left leg weakness   Atrial fibrillation   Chronic anticoagulation   TIA (transient ischemic attack)   Hyperglycemia   Possible Seizures  Stroke-like symptoms possibly secondary to Complicated Migraine vs. TIA -Neurology consulted and following -PT/OT and Inpt rehab consulted an following -Continue Xarelto -Neurology started Depakote ER 250mg  daily  Atrial Fibrillation with chronic antiocoagulation -Currently rhythm and rate controlled. -Continue Xarelto   Hypertension  -continue Norvasc    Hyperglycemia  -HbA1c 5.9 .  Migraines  -Continue Excedrin PRN (effective at home for years)   Code Status: Full  Family Communication: Daughter at bedside  Disposition Plan: Admitted.  Inpatient rehab consulted.    Procedures  EEG 02/12/2013 This is a normal EEG recording during wakefulness. No evidence of an epileptic disorder was demonstrated.  Consults   Neurology Inpatient Rehab  DVT Prophylaxis  Xarelto  Lab Results  Component Value Date   PLT 190 02/13/2013    Medications  Scheduled Meds: . amLODipine  2.5 mg Oral Daily  . atorvastatin  40 mg Oral q1800  . divalproex  250 mg Oral Daily  . multivitamin with minerals  1 tablet Oral Daily  . Rivaroxaban  15 mg Oral Q supper  . sodium chloride  3 mL Intravenous Q12H   Continuous Infusions:  PRN  Meds:.aspirin-acetaminophen-caffeine  Antibiotics   Anti-infectives   None     Time Spent in minutes   30 minutes   Aris Even D.O. on 02/14/2013 at 1:04 PM  Between 7am to 7pm - Pager - [phone]  After 7pm go to [email or web address] - password [email or web address]  And look for the night coverage person covering for me after hours  [facility] Office  [phone]    Subjective:   [name] seen and examined today.  Patient has no new complaints today.  She still feels weak in left arm and leg.  Patient denies dizziness, chest pain, shortness of breath, abdominal pain, N/V/D/C, new weakness, numbess, tingling.    Objective:   Filed Vitals:   02/14/13 0119 02/14/13 0532 02/14/13 1038 02/14/13 1112  BP: 135/83 145/83 156/68 154/82  Pulse: 60 67 66   Temp: 97.4 F (36.3 C) 97.8 F (36.6 C) 97.6 F (36.4 C)   TempSrc: Oral Oral Oral   Resp: 18 18 20    Height:      Weight:      SpO2: 98% 96% 97%     Wt Readings from Last  3 Encounters:  02/12/13 54.885 kg (121 lb)  02/05/13 54.885 kg (121 lb)  01/16/13 55.792 kg (123 lb)     Intake/Output Summary (Last 24 hours) at 02/14/13 1304 Last data filed at 02/13/13 1700  Gross per 24 hour  Intake    240 ml  Output      0 ml  Net    240 ml    Exam  General: Well developed, well nourished, NAD, appears stated age  HEENT: NCAT, PERRLA, EOMI, Anicteic Sclera, mucous membranes moist.   Neck: Supple, no JVD, no masses  Cardiovascular: S1 S2 auscultated, no rubs, murmurs or gallops. Regular rate and rhythm.  Respiratory: Clear to auscultation bilaterally with equal chest rise  Abdomen: Soft, nontender, nondistended, + bowel sounds  Extremities: warm dry without cyanosis clubbing or edema  Neuro: AAOx3, cranial nerves grossly intact. Strength 5/5 in RUE and RLE.  Strength 4/5 in LLE and LUE.  Skin: Without rashes exudates or nodules  Psych: Normal affect and demeanor with intact judgement and insight   Data  Review   Micro Results Recent Results (from the past 240 hour(s))  URINE CULTURE     Status: None   Collection Time    02/05/13  9:26 PM      Result Value Range Status   Specimen Description URINE, CLEAN CATCH   Final   Special Requests CX ADDED AT 2151 ON [id number]   Final   Culture  Setup Time     Final   Value: 02/06/2013 01:12     Performed at [facility]   Culture     Final   Value: Multiple bacterial morphotypes present, none predominant. Suggest appropriate recollection if clinically indicated.     Performed at [facility]   Report Status 02/06/2013 FINAL   Final    Radiology Reports Dg Chest 2 View  02/05/2013   CLINICAL DATA:  Stroke.  EXAM: CHEST  2 VIEW  COMPARISON:  07/02/2010.  FINDINGS: The heart remains normal in size. The lungs remain clear and hyperexpanded. Thoracic spine degenerative changes. Diffuse osteopenia.  IMPRESSION: Stable changes of COPD. No acute abnormality.   Electronically Signed   By: Gordan Payment M.D.   On: 02/05/2013 22:56   Ct Head Wo Contrast  02/12/2013   CLINICAL DATA:  Left-sided weakness  EXAM: CT HEAD WITHOUT CONTRAST  TECHNIQUE: Contiguous axial images were obtained from the base of the skull through the vertex without intravenous contrast.  COMPARISON:  Brain MRI 02/06/2013 and head CT scan 02/05/2013.  FINDINGS: Extensive chronic microvascular ischemic change is again seen with a remote corona radiata infarct on the right identified. No evidence of acute abnormality including infarction, hemorrhage, mass lesion, mass effect, midline shift abnormal extra-axial fluid collection is identified. There is no hydrocephalus or pneumocephalus. The calvarium is intact.  IMPRESSION: No acute finding.  Atrophy, chronic microvascular ischemic change and remote corona radiata infarct, unchanged.  Critical Value/emergent results were called by telephone at the time of interpretation on 02/12/2013 at 7:41 AM to Dr.Stewart , who verbally  acknowledged these results.   Electronically Signed   By: Drusilla Kanner M.D.   On: 02/12/2013 07:41   Ct Head Wo Contrast  02/05/2013   CLINICAL DATA:  Head pain post fall.  EXAM: CT HEAD WITHOUT CONTRAST  TECHNIQUE: Contiguous axial images were obtained from the base of the skull through the vertex without intravenous contrast.  COMPARISON:  08/01/2012 MR and CT.  FINDINGS: No skull fracture or intracranial  hemorrhage.  Remote infarct right external capsule extending into the right coronal radiata.  Prominent small vessel disease type changes. No CT evidence of large acute infarct.  Global atrophy.  No intracranial mass lesion noted on this unenhanced exam.  IMPRESSION: No skull fracture or intracranial hemorrhage.  Remote infarct right external capsule extending into the right coronal radiata.  Prominent small vessel disease type changes. No CT evidence of large acute infarct.  Global atrophy   Electronically Signed   By: Bridgett Larsson M.D.   On: 02/05/2013 10:41   Mr Brain Wo Contrast  02/12/2013   CLINICAL DATA:  History of atrial fibrillation, hypertension, hyperlipidemia, and previous stroke presenting with new onset left facial droop and left hemiparesis as well as left-sided numbness. Last known well at 6:30 a.m. today. Admitted 1 week ago for leg weakness of acute onset without MR evidence of acute stroke.  EXAM: MRI HEAD WITHOUT CONTRAST  TECHNIQUE: Multiplanar, multiecho pulse sequences of the brain and surrounding structures were obtained without intravenous contrast.  COMPARISON:  CT head 02/12/2013.  Brain MRI 02/06/2013.  FINDINGS: There is no evidence of acute infarct. Encephalomalacia is again seen at the site of old right corona radiata/lentiform nucleus infarct. There is moderate generalized cerebral atrophy. Confluent regions of periventricular T2 hyperintensity and small amount of T2 hyperintensity in the pons do not appear significantly changed and are consistent with moderate chronic  small vessel ischemic disease. Remote lacunar infarcts are again noted in the right cerebellum. There is no evidence of intracranial hemorrhage, mass, midline shift, or extra-axial fluid collection. Major intracranial flow voids are present. Prior bilateral cataract surgery is noted. Paranasal sinuses are clear.  IMPRESSION: 1. No evidence of acute infarct or other acute intracranial abnormality. 2. Unchanged appearance of old right corona radiata infarct and moderate chronic small vessel ischemic disease.   Electronically Signed   By: Sebastian Ache   On: 02/12/2013 10:11   Mr Brain Wo Contrast  02/06/2013   CLINICAL DATA:  79 year old female with left lower extremity weakness. Initial encounter.  EXAM: MRI HEAD WITHOUT CONTRAST  MRA HEAD WITHOUT CONTRAST  TECHNIQUE: Multiplanar, multiecho pulse sequences of the brain and surrounding structures were obtained without intravenous contrast. Angiographic images of the head were obtained using MRA technique without contrast.  COMPARISON:  Head CT 02/05/2013. Brain MRI and MRA 08/01/2012.  FINDINGS: MRI HEAD FINDINGS  Expected evolution of the right corona radiata -lentiform nuclei infarct which occurred in May. Cystic encephalomalacia. No restricted diffusion or evidence of acute infarction.  Major intracranial vascular flow voids are stable. Stable cerebral volume. No ventriculomegaly. No midline shift, mass effect, or evidence of intracranial mass lesion. No acute intracranial hemorrhage identified. Negative pituitary. Negative cervicomedullary junction. Stable visualized cervical spine. Confluent cerebral white matter T2 and FLAIR hyperintensity re- identified. Moderate T2 heterogeneity in the deep gray matter nuclei, in part related to perivascular spaces. Moderate T2 hyperintensity in the pons. Chronic lacunar infarcts in the right cerebellum.  Stable orbits soft tissues. Stable paranasal sinuses and mastoids. Visualized scalp soft tissues are within normal  limits. Normal bone marrow signal.  MRA HEAD FINDINGS  Stable antegrade flow in the posterior circulation with codominant distal vertebral arteries. Normal left PICA. Dominant, duplicated right AICA. Patent basilar artery without stenosis. SCA and PCA origins are within normal limits. Posterior communicating artery is diminutive or absent. Mild to moderate bilateral PCA branch irregularity is stable with preserved distal flow.  Antegrade flow in both ICA siphons. Stable ICA ectasia. No  ICA stenosis. Ophthalmic artery origins are normal. Stable and normal carotid termini, MCA and ACA origins. Normal anterior communicating artery.  Stable moderate to severe left ACA A2 stenosis with preserved distal flow. Suggestion of a new right ACA callosomarginal region high-grade stenosis (series 5, image 128) with preserved distal flow.  Mild bilateral MCA irregularity, relatively sparing the M1 segments, is stable. No major MCA branch occlusion identified.  IMPRESSION: 1. No acute intracranial abnormality. Expected evolution of right hemisphere lacunar infarct since May. Underlying advanced chronic small vessel disease.  2. Suggestion of new distal right ACA hemodynamically significant stenosis, at the callosomarginal artery level, with preserved distal flow.  3. Otherwise stable intracranial MRA since 08/01/2012, with overall moderate medium-sized vessel irregularity compatible with atherosclerosis.   Electronically Signed   By: Augusto Gamble M.D.   On: 02/06/2013 09:07   Mr Lumbar Spine Wo Contrast  02/14/2013   CLINICAL DATA:  Left hemiplegia not associated with stroke.  EXAM: MRI LUMBAR SPINE WITHOUT CONTRAST  TECHNIQUE: Multiplanar, multisequence MR imaging was performed. No intravenous contrast was administered.  COMPARISON:  None.  FINDINGS: No marrow signal abnormality suggestive of fracture or neoplasm. Hyperintensity within the inferior endplate of L1 and around the T11-T12 anterior disc is likely degenerative. Normal  conus signal and morphology. No left-sided nerve thickening. No extra-spinal findings to explain hemiplegia. There is a tiny (4 mm) T2 hyperintense lesion in the lower left kidney, likely a cyst.  Degenerative changes:  L1-L2: No nerve impingement.  L2-L3: Posterior annular fissure. Disc bulging combined with dorsal ligamentous and facet overgrowth narrows the lateral recesses. No foraminal stenosis.  L3-L4: Mild retrolisthesis. Degenerative facet and ligamentous overgrowth combined with disc bulging and central herniation cause moderate to advanced canal stenosis, with near complete effacement of CSF. The lateral recesses are particularly stenotic. The inferior foramina are effaced.  L4-L5: Facet osteoarthritis with bony and ligamentous overgrowth, causing moderate encroachment on the foramina. The lateral recesses are also narrowed, without nerve compression.  L5-S1:Degenerative disc narrowing.  No nerve compression.  IMPRESSION: 1. No unilateral abnormality to explain left hemiplegia. 2. Moderate to advanced spinal canal stenosis at L3-4, secondary to both disc and facet degeneration. 3. Mild degenerative lateral recess stenosis bilaterally at L2-3 and L4-5.   Electronically Signed   By: Tiburcio Pea M.D.   On: 02/14/2013 04:04   Dg Foot 2 Views Left  02/06/2013   CLINICAL DATA:  Foot pain  EXAM: LEFT FOOT - 2 VIEW  COMPARISON:  None.  FINDINGS: Normal alignment. Negative for fracture. Calcification in the Achilles tendon. No significant arthropathy.  IMPRESSION: No acute abnormality.   Electronically Signed   By: Marlan Palau M.D.   On: 02/06/2013 15:18   Mr Maxine Glenn Head/brain Wo Cm  02/06/2013   CLINICAL DATA:  79 year old female with left lower extremity weakness. Initial encounter.  EXAM: MRI HEAD WITHOUT CONTRAST  MRA HEAD WITHOUT CONTRAST  TECHNIQUE: Multiplanar, multiecho pulse sequences of the brain and surrounding structures were obtained without intravenous contrast. Angiographic images of the  head were obtained using MRA technique without contrast.  COMPARISON:  Head CT 02/05/2013. Brain MRI and MRA 08/01/2012.  FINDINGS: MRI HEAD FINDINGS  Expected evolution of the right corona radiata -lentiform nuclei infarct which occurred in May. Cystic encephalomalacia. No restricted diffusion or evidence of acute infarction.  Major intracranial vascular flow voids are stable. Stable cerebral volume. No ventriculomegaly. No midline shift, mass effect, or evidence of intracranial mass lesion. No acute intracranial hemorrhage identified. Negative pituitary.  Negative cervicomedullary junction. Stable visualized cervical spine. Confluent cerebral white matter T2 and FLAIR hyperintensity re- identified. Moderate T2 heterogeneity in the deep gray matter nuclei, in part related to perivascular spaces. Moderate T2 hyperintensity in the pons. Chronic lacunar infarcts in the right cerebellum.  Stable orbits soft tissues. Stable paranasal sinuses and mastoids. Visualized scalp soft tissues are within normal limits. Normal bone marrow signal.  MRA HEAD FINDINGS  Stable antegrade flow in the posterior circulation with codominant distal vertebral arteries. Normal left PICA. Dominant, duplicated right AICA. Patent basilar artery without stenosis. SCA and PCA origins are within normal limits. Posterior communicating artery is diminutive or absent. Mild to moderate bilateral PCA branch irregularity is stable with preserved distal flow.  Antegrade flow in both ICA siphons. Stable ICA ectasia. No ICA stenosis. Ophthalmic artery origins are normal. Stable and normal carotid termini, MCA and ACA origins. Normal anterior communicating artery.  Stable moderate to severe left ACA A2 stenosis with preserved distal flow. Suggestion of a new right ACA callosomarginal region high-grade stenosis (series 5, image 128) with preserved distal flow.  Mild bilateral MCA irregularity, relatively sparing the M1 segments, is stable. No major MCA branch  occlusion identified.  IMPRESSION: 1. No acute intracranial abnormality. Expected evolution of right hemisphere lacunar infarct since May. Underlying advanced chronic small vessel disease.  2. Suggestion of new distal right ACA hemodynamically significant stenosis, at the callosomarginal artery level, with preserved distal flow.  3. Otherwise stable intracranial MRA since 08/01/2012, with overall moderate medium-sized vessel irregularity compatible with atherosclerosis.   Electronically Signed   By: Augusto Gamble M.D.   On: 02/06/2013 09:07    CBC  Recent Labs Lab 02/12/13 0730 02/12/13 0733 02/13/13 0905  WBC 5.2  --  5.7  HGB 13.3 15.3* 12.6  HCT 41.0 45.0 38.0  PLT 179  --  190  MCV 90.1  --  90.0  MCH 29.2  --  29.9  MCHC 32.4  --  33.2  RDW 13.5  --  13.6  LYMPHSABS 1.2  --   --   MONOABS 0.7  --   --   EOSABS 0.2  --   --   BASOSABS 0.1  --   --     Chemistries   Recent Labs Lab 02/12/13 0730 02/12/13 0733 02/13/13 0555  NA 139 142 140  K 3.8 3.8 4.2  CL 104 104 108  CO2 24  --  21  GLUCOSE 168* 171* 83  BUN 13 14 15   CREATININE 0.98 1.20* 0.83  CALCIUM 8.9  --  8.7  AST 25  --   --   ALT 14  --   --   ALKPHOS 81  --   --   BILITOT 0.6  --   --    ------------------------------------------------------------------------------------------------------------------ estimated creatinine clearance is 42.2 ml/min (by C-G formula based on Cr of 0.83). ------------------------------------------------------------------------------------------------------------------  Recent Labs  02/12/13 1645  HGBA1C 5.9*   ------------------------------------------------------------------------------------------------------------------ No results found for this basename: CHOL, HDL, LDLCALC, TRIG, CHOLHDL, LDLDIRECT,  in the last 72 hours ------------------------------------------------------------------------------------------------------------------ No results found for this  basename: TSH, T4TOTAL, FREET3, T3FREE, THYROIDAB,  in the last 72 hours ------------------------------------------------------------------------------------------------------------------  Recent Labs  02/12/13 0730  VITAMINB12 627  FOLATE >20.0    Coagulation profile  Recent Labs Lab 02/12/13 0730  INR 1.55*    No results found for this basename: DDIMER,  in the last 72 hours  Cardiac Enzymes  Recent Labs Lab 02/12/13 0730  TROPONINI <0.30   ------------------------------------------------------------------------------------------------------------------  No components found with this basename: POCBNP,

## 2013-02-14 NOTE — Consult Note (Signed)
Physical Medicine and Rehabilitation Consult Reason for Consult: Suspect CVA Referring Physician: [facility]   HPI: [name] is a 79 y.o. 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 Admitted 02/12/2013 with new onset of left facial droop as well as left-sided weakness. Recent admit 02/05/2013 for recurrent left-sided weakness with MRI showing expected evolution of right corona radiata lentiform nuclei infarct which did occur in May no new acute abnormalities. Followup MRI 02/12/2013 again showing no evidence of acute infarct. Patient did not receive TPA. MRI lumbar spine showed no unilateral abnormality to explain left hemiplegia. Neurology followup patient remains on Xarelto for CVA prophylaxis workup indicated of likely subcortical small vessel infarct. Patient is on a regular consistency diet. Physical therapy evaluation completed 02/13/2013 with recommendations of physical medicine rehabilitation consult to consider inpatient rehabilitation services.  Patient and family report that the left arm was initially flaccid. She is now regained movement into gravity. Her left leg remains week however improved compared to admission. Review of Systems  HENT: Positive for hearing loss.        Migraine headaches  Cardiovascular: Positive for palpitations.  Musculoskeletal: Positive for joint pain and myalgias.  All other systems reviewed and are negative.   Past Medical History  Diagnosis Date  . Arthritis   . Hypertension   . Migraine   . Colon polyp   . Hearing difficulty   . Torus palatinus   . Atrial flutter January, 2012  . Stroke 08/02/12     right lenticular nucleus infarct  . Left leg weakness 02/05/2013  . Chronic anticoagulation   . High cholesterol    Past Surgical History  Procedure Laterality Date  . Cataract extraction w/ intraocular lens  implant, bilateral   2006-2008  . Ganglion cyst excision Bilateral 1938,1954,2003,2005    "wrists/hand" (08/01/2012)  . Cardioversion  05/19/2010    Dr. Jacinto Halim  . Tonsillectomy  ~ 1935  . Appendectomy  02/19/53    `   Family History  Problem Relation Age of Onset  . Colon cancer    . Breast cancer    . Brain cancer    . Cancer Mother     breast  . Aneurysm Mother     brain  . Heart attack Neg Hx   . Diabetes Neg Hx   . Hypertension Neg Hx    Social History:  reports that she has quit smoking. She has never used smokeless tobacco. She reports that she drinks alcohol. She reports that she does not use illicit drugs. Allergies:  Allergies  Allergen Reactions  . Tramadol Hcl Other (See Comments)     lethargy, nausea  . Alendronate Sodium Rash and Other (See Comments)    puffy eyes   Medications Prior to Admission  Medication Sig Dispense Refill  . amLODipine (NORVASC) 2.5 MG tablet Take 2.5-5 mg by mouth daily as needed. Takes 1 tablet if SBP >150; 2 tablets if SPB . 175 or DBP > 100.      Marland Kitchen aspirin-acetaminophen-caffeine (EXCEDRIN MIGRAINE) 250-250-65 MG per tablet Take 2 tablets by mouth daily as needed for pain (for migraines).      Marland Kitchen atorvastatin (LIPITOR) 40 MG tablet Take 1 tablet (40 mg total) by mouth daily at 6 PM.  30 tablet  0  . midodrine (PROAMATINE) 5 MG tablet Take 5 mg by mouth 3 (three) times daily as needed (for low BP). While awake for  dizziness      . Multiple Vitamin (MULTIVITAMIN WITH MINERALS) TABS Take 1 tablet by mouth daily.      . Rivaroxaban (XARELTO) 15 MG TABS tablet Take 1 tablet (15 mg total) by mouth daily with supper.  30 tablet  3    Home: Home Living Family/patient expects to be discharged to:: Private residence Living Arrangements: Children Available Help at Discharge: Family;Available PRN/intermittently (all but 4-5 hours a day) Type of Home: House Home Access: Stairs to enter [employer] of Steps: 1 Entrance Stairs-Rails: None Home Layout: Two  level;Bed/bath upstairs Alternate Level Stairs-Number of Steps: 15 Alternate Level Stairs-Rails: Right;Left Home Equipment: Walker - 2 wheels;Shower seat;Transport chair;Grab bars - tub/shower Additional Comments: pt not wanting SNF-daughters report they can figure out 24/7 assistance  Functional History: Prior Function Comments: daughters provided supervision for bathing and assisted with giong up and down stairs Functional Status:  Mobility: Bed Mobility Bed Mobility: Rolling Right;Right Sidelying to Sit;Sitting - Scoot to [employer] of Bed;Sit to Sidelying Right Rolling Right: 3: Mod assist Right Sidelying to Sit: 3: Mod assist Supine to Sit: 3: Mod assist Sit to Supine: 3: Mod assist Sit to Sidelying Right: 3: Mod assist Transfers Transfers: Sit to Stand;Stand to Sit;Stand Pivot Transfers Sit to Stand: 3: Mod assist;From bed;From chair/3-in-1 Stand to Sit: 3: Mod assist Stand Pivot Transfers: 1: +1 Total assist Ambulation/Gait Ambulation/Gait Assistance: Not tested (comment)    ADL:    Cognition: Cognition Overall Cognitive Status: Within Functional Limits for tasks assessed Orientation Level: Oriented X4 Cognition Arousal/Alertness: Awake/alert Behavior During Therapy: WFL for tasks assessed/performed Overall Cognitive Status: Within Functional Limits for tasks assessed  Blood pressure 145/83, pulse 67, temperature 97.8 F (36.6 C), temperature source Oral, resp. rate 18, height 5\' 7"  (1.702 m), weight 54.885 kg (121 lb), SpO2 96.00%. Physical Exam  Constitutional: She is oriented to person, place, and time.  HENT:  Head: Normocephalic.  Eyes: EOM are normal.  Neck: Normal range of motion. Neck supple. No thyromegaly present.  Cardiovascular:  Cardiac rate controlled  Respiratory: Effort normal and breath sounds normal. No respiratory distress.  GI: Soft. Bowel sounds are normal. She exhibits no distension.  Neurological: She is alert and oriented to person, place,  and time.  Patient follows full commands  Skin: Skin is warm and dry.  Psychiatric: She has a normal mood and affect.   motor strength 3 minus left deltoid, bicep, tricep, grip 3 minus left hip flexor, left hip extensor, 2 minus at the ankle dorsiflexor and plantar flexor Right side is 4/5 in the upper and lower limb Sensory intact to light touch on the left side of the body No evidence of facial droop  Results for orders placed during the hospital encounter of 02/12/13 (from the past 24 hour(s))  GLUCOSE, CAPILLARY     Status: None   Collection Time    02/13/13  6:36 AM      Result Value Range   Glucose-Capillary 85  70 - 99 mg/dL  CBC     Status: None   Collection Time    02/13/13  9:05 AM      Result Value Range   WBC 5.7  4.0 - 10.5 K/uL   RBC 4.22  3.87 - 5.11 MIL/uL   Hemoglobin 12.6  12.0 - 15.0 g/dL   HCT 16.1  09.6 - 04.5 %   MCV 90.0  78.0 - 100.0 fL   MCH 29.9  26.0 - 34.0 pg   MCHC 33.2  30.0 - 36.0 g/dL   RDW 16.1  09.6 - 04.5 %   Platelets 190  150 - 400 K/uL  GLUCOSE, CAPILLARY     Status: None   Collection Time    02/13/13 11:29 AM      Result Value Range   Glucose-Capillary 91  70 - 99 mg/dL  GLUCOSE, CAPILLARY     Status: Abnormal   Collection Time    02/13/13  4:45 PM      Result Value Range   Glucose-Capillary 110 (*) 70 - 99 mg/dL  GLUCOSE, CAPILLARY     Status: None   Collection Time    02/13/13 10:01 PM      Result Value Range   Glucose-Capillary 97  70 - 99 mg/dL   Comment 1 Documented in Chart     Comment 2 Notify RN     Ct Head Wo Contrast  02/12/2013   CLINICAL DATA:  Left-sided weakness  EXAM: CT HEAD WITHOUT CONTRAST  TECHNIQUE: Contiguous axial images were obtained from the base of the skull through the vertex without intravenous contrast.  COMPARISON:  Brain MRI 02/06/2013 and head CT scan 02/05/2013.  FINDINGS: Extensive chronic microvascular ischemic change is again seen with a remote corona radiata infarct on the right identified. No  evidence of acute abnormality including infarction, hemorrhage, mass lesion, mass effect, midline shift abnormal extra-axial fluid collection is identified. There is no hydrocephalus or pneumocephalus. The calvarium is intact.  IMPRESSION: No acute finding.  Atrophy, chronic microvascular ischemic change and remote corona radiata infarct, unchanged.  Critical Value/emergent results were called by telephone at the time of interpretation on 02/12/2013 at 7:41 AM to Dr.Stewart , who verbally acknowledged these results.   Electronically Signed   By: Drusilla Kanner M.D.   On: 02/12/2013 07:41   Mr Brain Wo Contrast  02/12/2013   CLINICAL DATA:  History of atrial fibrillation, hypertension, hyperlipidemia, and previous stroke presenting with new onset left facial droop and left hemiparesis as well as left-sided numbness. Last known well at 6:30 a.m. today. Admitted 1 week ago for leg weakness of acute onset without MR evidence of acute stroke.  EXAM: MRI HEAD WITHOUT CONTRAST  TECHNIQUE: Multiplanar, multiecho pulse sequences of the brain and surrounding structures were obtained without intravenous contrast.  COMPARISON:  CT head 02/12/2013.  Brain MRI 02/06/2013.  FINDINGS: There is no evidence of acute infarct. Encephalomalacia is again seen at the site of old right corona radiata/lentiform nucleus infarct. There is moderate generalized cerebral atrophy. Confluent regions of periventricular T2 hyperintensity and small amount of T2 hyperintensity in the pons do not appear significantly changed and are consistent with moderate chronic small vessel ischemic disease. Remote lacunar infarcts are again noted in the right cerebellum. There is no evidence of intracranial hemorrhage, mass, midline shift, or extra-axial fluid collection. Major intracranial flow voids are present. Prior bilateral cataract surgery is noted. Paranasal sinuses are clear.  IMPRESSION: 1. No evidence of acute infarct or other acute intracranial  abnormality. 2. Unchanged appearance of old right corona radiata infarct and moderate chronic small vessel ischemic disease.   Electronically Signed   By: Sebastian Ache   On: 02/12/2013 10:11   Mr Lumbar Spine Wo Contrast  02/14/2013   CLINICAL DATA:  Left hemiplegia not associated with stroke.  EXAM: MRI LUMBAR SPINE WITHOUT CONTRAST  TECHNIQUE: Multiplanar, multisequence MR imaging was performed. No intravenous contrast was administered.  COMPARISON:  None.  FINDINGS: No marrow signal abnormality suggestive of fracture or  neoplasm. Hyperintensity within the inferior endplate of L1 and around the T11-T12 anterior disc is likely degenerative. Normal conus signal and morphology. No left-sided nerve thickening. No extra-spinal findings to explain hemiplegia. There is a tiny (4 mm) T2 hyperintense lesion in the lower left kidney, likely a cyst.  Degenerative changes:  L1-L2: No nerve impingement.  L2-L3: Posterior annular fissure. Disc bulging combined with dorsal ligamentous and facet overgrowth narrows the lateral recesses. No foraminal stenosis.  L3-L4: Mild retrolisthesis. Degenerative facet and ligamentous overgrowth combined with disc bulging and central herniation cause moderate to advanced canal stenosis, with near complete effacement of CSF. The lateral recesses are particularly stenotic. The inferior foramina are effaced.  L4-L5: Facet osteoarthritis with bony and ligamentous overgrowth, causing moderate encroachment on the foramina. The lateral recesses are also narrowed, without nerve compression.  L5-S1:Degenerative disc narrowing.  No nerve compression.  IMPRESSION: 1. No unilateral abnormality to explain left hemiplegia. 2. Moderate to advanced spinal canal stenosis at L3-4, secondary to both disc and facet degeneration. 3. Mild degenerative lateral recess stenosis bilaterally at L2-3 and L4-5.   Electronically Signed   By: Tiburcio Pea M.D.   On: 02/14/2013 04:04    Assessment/Plan: Diagnosis:  Left hemiparesis question TIA vs seizure, appears to be improving 1. Does the need for close, 24 hr/day medical supervision in concert with the patient's rehab needs make it unreasonable for this patient to be served in a less intensive setting? Potentially 2. Co-Morbidities requiring supervision/potential complications: Atrial fibrillation, lumbar spinal stenosis 3. Due to bladder management, bowel management, safety, skin/wound care, disease management, medication administration, pain management and patient education, does the patient require 24 hr/day rehab nursing? Potentially 4. Does the patient require coordinated care of a physician, rehab nurse, PT (1-2 hrs/day, 5 days/week) and OT (1-2 hrs/day, 5 days/week) to address physical and functional deficits in the context of the above medical diagnosis(es)? Potentially Addressing deficits in the following areas: balance, endurance, locomotion, strength, transferring, bowel/bladder control, bathing, dressing, feeding, grooming and toileting 5. Can the patient actively participate in an intensive therapy program of at least 3 hrs of therapy per day at least 5 days per week? Yes 6. The potential for patient to make measurable gains while on inpatient rehab is good 7. Anticipated functional outcomes upon discharge from inpatient rehab are supervision mobility with PT, supervision ADLs with OT, not applicable with SLP. 8. Estimated rehab length of stay to reach the above functional goals is: 8-10 days 9. Does the patient have adequate social supports to accommodate these discharge functional goals? Yes 10. Anticipated D/C setting: Home 11. Anticipated post D/C treatments: HH therapy 12. Overall Rehab/Functional Prognosis: good  RECOMMENDATIONS: This patient's condition is appropriate for continued rehabilitative care in the following setting: CIR if the patient continues to require physical assistance, if improves to supervision level: Home  health Patient has agreed to participate in recommended program. Potentially Note that insurance prior authorization may be required for reimbursement for recommended care.  Comment: Await further neurologic assessment    02/14/2013

## 2013-02-14 NOTE — Progress Notes (Addendum)
Physical Therapy Treatment Patient Details Name: [name] MRN: [id number] DOB: 21-Nov-1924 Today's Date: 02/14/2013 Time: 4098-1191 PT Time Calculation (min): 27 min  PT Assessment / Plan / Recommendation  History of Present Illness [name] is an 79 y.o. female history of atrial fibrillation, hypertension, hyperlipidemia and previous stroke presenting with new onset left facial droop and left hemiparesis as well as numbness on the left side. Patient was last known well at 6:30 AM today 02/12/2013. She's been on Xarelto for anticoagulation. She was admitted here one week ago for leg weakness of acute onset. MRI showed no evidence of recurrent acute stroke. NIH stroke scale today was 9. Exam findings indicated probable subcortical right recurrent small vessel TIA or infarction. Patient was not a TPA candidate secondary to being on xarelto. She was also felt to not be a candidate for interventional radiology because deficits were indicative of likely subcortical small vessel involvement with no indications of proximal MCA or ICA occlusion. NIH stroke score was 9.  She was admitted for further evaluation and treatment.   PT Comments   Patient demonstrates increased active motion with LLE (toes, and some L foot movement actively), patient does demonstrate activation and varying degrees of strength against resistance for all other LLE exercise activities including graded knee extension against pressure from foot. Patient also demonstrates trace knee flexion actively.  Additionally, patients trunk control improved today compared to prior session. Will continue to see and progress as tolerated.    Follow Up Recommendations  CIR;Supervision/Assistance - 24 hour           Equipment Recommendations   (TBD)    Recommendations for Other Services    Frequency Min 4X/week   Progress towards PT Goals Progress towards PT goals: Progressing toward goals  Plan Current plan remains appropriate     Precautions / Restrictions Precautions Precautions: Fall Restrictions Weight Bearing Restrictions: No   Pertinent Vitals/Pain Patient reports no pain    Mobility  Bed Mobility Bed Mobility: Rolling Right;Right Sidelying to Sit;Sitting - Scoot to [employer] of Bed;Sit to Sidelying Right Rolling Right: 3: Mod assist Right Sidelying to Sit: 3: Mod assist Supine to Sit: 3: Mod assist Sit to Supine: 3: Mod assist Sit to Sidelying Right: 3: Mod assist Details for Bed Mobility Assistance: Assist for LLE and trunk, assist to rotate hips to EOB Transfers Transfers: Not assessed Ambulation/Gait Ambulation/Gait Assistance: Not tested (comment)    Exercises General Exercises - Lower Extremity Ankle Circles/Pumps: AROM;Both;10 reps Quad Sets: AAROM;Left;10 reps Long Arc Quad: AAROM;Left;10 reps Toe Raises: AROM;Left;10 reps Heel Raises: AAROM;Left;10 reps     PT Goals (current goals can now be found in the care plan section) Acute Rehab PT Goals Patient Stated Goal: to be able to walk again PT Goal Formulation: With patient Time For Goal Achievement: 02/13/13 Potential to Achieve Goals: Good  Visit Information  Last PT Received On: 02/14/13 Assistance Needed: +2 History of Present Illness: [name] is an 79 y.o. female history of atrial fibrillation, hypertension, hyperlipidemia and previous stroke presenting with new onset left facial droop and left hemiparesis as well as numbness on the left side. Patient was last known well at 6:30 AM today 02/12/2013. She's been on Xarelto for anticoagulation. She was admitted here one week ago for leg weakness of acute onset. MRI showed no evidence of recurrent acute stroke. NIH stroke scale today was 9. Exam findings indicated probable subcortical right recurrent small vessel TIA or infarction. Patient was not a TPA  candidate secondary to being on xarelto. She was also felt to not be a candidate for interventional radiology because deficits were  indicative of likely subcortical small vessel involvement with no indications of proximal MCA or ICA occlusion. NIH stroke score was 9.  She was admitted for further evaluation and treatment.    Subjective Data  Patient Stated Goal: to be able to walk again   Cognition  Cognition Arousal/Alertness: Awake/alert Behavior During Therapy: WFL for tasks assessed/performed Overall Cognitive Status: Within Functional Limits for tasks assessed    Balance  Static Sitting Balance Static Sitting - Balance Support: Feet supported Static Sitting - Level of Assistance: 5: Stand by assistance;4: Min assist Static Sitting - Comment/# of Minutes: Better trunk control today, able to sit supervision with Cues to correct posture Dynamic Sitting Balance Dynamic Sitting - Balance Support: Feet supported;During functional activity Dynamic Sitting - Level of Assistance: 5: Stand by assistance;4: Min assist Dynamic Sitting - Balance Activities: Lateral lean/weight shifting;Forward lean/weight shifting;Reaching for objects;Reaching across midline;Trunk control activities Dynamic Sitting - Comments: Pt with improved trunk control today, easily fatigues  End of Session PT - End of Session Equipment Utilized During Treatment: Gait belt Activity Tolerance: Patient tolerated treatment well Patient left: in bed;with call bell/phone within reach;with family/visitor present Nurse Communication: Mobility status   GP     Fabio Asa 02/14/2013, 10:16 AM Charlotte Crumb, PT DPT  [phone]

## 2013-02-14 NOTE — Progress Notes (Signed)
Nutrition Brief Note  Patient identified on the Malnutrition Screening Tool (MST) Report for recent weight lost without trying (patient unsure) and eating poorly because of a decreased appetite.  Per weight readings, patient's weight has been stable.  Wt Readings from Last 15 Encounters:  02/12/13 121 lb (54.885 kg)  02/05/13 121 lb (54.885 kg)  01/16/13 123 lb (55.792 kg)  11/14/12 121 lb 1.3 oz (54.922 kg)  10/12/12 122 lb (55.339 kg)  08/18/12 123 lb (55.792 kg)  08/01/12 122 lb 5.7 oz (55.5 kg)  06/01/12 120 lb 13 oz (54.8 kg)  01/14/12 124 lb 6.4 oz (56.427 kg)  01/18/11 120 lb (54.432 kg)  10/12/10 120 lb 0.6 oz (54.45 kg)  09/18/10 119 lb (53.978 kg)  09/17/10 119 lb (53.978 kg)  09/15/10 120 lb (54.432 kg)  08/21/10 121 lb 3.2 oz (54.976 kg)    Body mass index is 18.95 kg/(m^2). Patient meets criteria for Normal based on current BMI.   Current diet order is Carbohydrate Modified Medium Calorie, patient's average consumption of meals is 85% at this time. Labs and medications reviewed.   No nutrition interventions warranted at this time. If nutrition issues arise, please consult RD.   Maureen Chatters, RD, LDN Pager #: [phone] After-Hours Pager #: [phone]

## 2013-02-14 NOTE — Progress Notes (Signed)
Stroke Team Progress Note  HISTORY [name] is an 79 y.o. female history of atrial fibrillation, hypertension, hyperlipidemia and previous stroke presenting with new onset left facial droop and left hemiparesis as well as numbness on the left side. Patient was last known well at 6:30 AM today 02/12/2013. She's been on Xarelto for anticoagulation. She was admitted here one week ago for leg weakness of acute onset. MRI showed no evidence of recurrent acute stroke. NIH stroke scale today was 9. Exam findings indicated probable subcortical right recurrent small vessel TIA or infarction. Patient was not a TPA candidate secondary to being on xarelto. She was also felt to not be a candidate for interventional radiology because deficits were indicative of likely subcortical small vessel involvement with no indications of proximal MCA or ICA occlusion. NIH stroke score was 9.  She was admitted for further evaluation and treatment.  SUBJECTIVE Daughter at bedside. Patient reports she is feeling a little better.  OBJECTIVE Most recent Vital Signs: Filed Vitals:   02/13/13 2122 02/14/13 0119 02/14/13 0532 02/14/13 1038  BP: 127/80 135/83 145/83 156/68  Pulse: 50 60 67 66  Temp: 98 F (36.7 C) 97.4 F (36.3 C) 97.8 F (36.6 C) 97.6 F (36.4 C)  TempSrc: Oral Oral Oral Oral  Resp: 19 18 18 20   Height:      Weight:      SpO2: 96% 98% 96% 97%   CBG (last 3)   Recent Labs  02/13/13 1645 02/13/13 2201 02/14/13 0626  GLUCAP 110* 97 106*    IV Fluid Intake:     MEDICATIONS  . amLODipine  2.5 mg Oral Daily  . atorvastatin  40 mg Oral q1800  . insulin aspart  0-9 Units Subcutaneous TID WC  . multivitamin with minerals  1 tablet Oral Daily  . Rivaroxaban  15 mg Oral Q supper  . sodium chloride  3 mL Intravenous Q12H   PRN:  aspirin-acetaminophen-caffeine  Diet:  Carb Control thin liquids Activity:   DVT Prophylaxis:  xarelto  CLINICALLY SIGNIFICANT STUDIES Basic Metabolic Panel:    Recent Labs Lab 02/12/13 0730 02/12/13 0733 02/13/13 0555  NA 139 142 140  K 3.8 3.8 4.2  CL 104 104 108  CO2 24  --  21  GLUCOSE 168* 171* 83  BUN 13 14 15   CREATININE 0.98 1.20* 0.83  CALCIUM 8.9  --  8.7   Liver Function Tests:   Recent Labs Lab 02/12/13 0730  AST 25  ALT 14  ALKPHOS 81  BILITOT 0.6  PROT 6.9  ALBUMIN 3.4*   CBC:   Recent Labs Lab 02/12/13 0730 02/12/13 0733 02/13/13 0905  WBC 5.2  --  5.7  NEUTROABS 3.1  --   --   HGB 13.3 15.3* 12.6  HCT 41.0 45.0 38.0  MCV 90.1  --  90.0  PLT 179  --  190   Coagulation:   Recent Labs Lab 02/12/13 0730  LABPROT 18.2*  INR 1.55*   Cardiac Enzymes:   Recent Labs Lab 02/12/13 0730  TROPONINI <0.30   Urinalysis:   Recent Labs Lab 02/12/13 1046  COLORURINE YELLOW  LABSPEC 1.014  PHURINE 7.5  GLUCOSEU NEGATIVE  HGBUR NEGATIVE  BILIRUBINUR NEGATIVE  KETONESUR NEGATIVE  PROTEINUR NEGATIVE  UROBILINOGEN 0.2  NITRITE NEGATIVE  LEUKOCYTESUR NEGATIVE   Lipid Panel    Component Value Date/Time   CHOL 170 02/06/2013 0525   TRIG 90 02/06/2013 0525   HDL 58 02/06/2013 0525   CHOLHDL 2.9  02/06/2013 0525   VLDL 18 02/06/2013 0525   LDLCALC 94 02/06/2013 0525   HgbA1C  Lab Results  Component Value Date   HGBA1C 5.9* 02/12/2013    Urine Drug Screen:     Component Value Date/Time   LABOPIA NONE DETECTED 02/12/2013 1046   COCAINSCRNUR NONE DETECTED 02/12/2013 1046   LABBENZ NONE DETECTED 02/12/2013 1046   AMPHETMU NONE DETECTED 02/12/2013 1046   THCU NONE DETECTED 02/12/2013 1046   LABBARB NONE DETECTED 02/12/2013 1046    Alcohol Level:   Recent Labs Lab 02/12/13 0730  ETH <11   CT of the brain  02/12/2013   No acute finding.  Atrophy, chronic microvascular ischemic change and remote corona radiata infarct, unchanged.    MRI of the brain  02/12/2013    1. No evidence of acute infarct or other acute intracranial abnormality. 2. Unchanged appearance of old right corona radiata  infarct and moderate chronic small vessel ischemic disease.     MRI Lumbar Spine 02/14/2013 1. No unilateral abnormality to explain left hemiplegia. 2. Moderate to advanced spinal canal stenosis at L3-4, secondary to both disc and facet degeneration. 3. Mild degenerative lateral recess stenosis bilaterally at L2-3 and L4-5.  EKG  unchanged from previous tracings, atrial flutter, rate 80.   EEG  02/12/2013  This is a normal EEG recording during wakefulness. No evidence of an epileptic disorder was demonstrated.  Therapy Recommendations CIR  Physical Exam   GENERAL EXAM: Patient is in no distress  CARDIOVASCULAR: Regular rate and rhythm, no murmurs, no carotid bruits  NEUROLOGIC: MENTAL STATUS: awake, alert, language fluent, comprehension intact, naming intact CRANIAL NERVE: pupils equal and reactive to light, visual fields full to confrontation, extraocular muscles intact, no nystagmus, facial sensation DECR ON LEFT. LEFT NL FOLD DECR. Uvula midline, shoulder shrug symmetric, tongue midline. MOTOR: RUE AND RLE 5. LUE 4. LLE (NO MOVEMENT ON VOLUNTARY EFFORT; 1-2 ON PLANTAR STIM).  SENSORY: DECR IN LEFT ARM AND LEG. COORDINATION: finger-nose-finger normal REFLEXES: BUE 2, RLE 1, LLE 0 GAIT/STATION: LAYING IN BED   ASSESSMENT [name] is a 79 y.o. female presenting with recurrent weakness and numbness of left side, LOC and incontinence in setting of headache. Imaging confirms no acute infarct.   Her left leg weakness is out of proportion to her left face/arm findings. MRI negative for new stroke. Lumbar radiculopathy / peripheral neuropathy is not severe enough to cause the HP.  Due to facial involvement, do not feel neck is involved - sagittal MRI images  without stenosis at C1 and C2 s . Dx:  Likely a new small right brain stroke not seen on MRI that has led to left leg hemiplegia > left arm/face hemiparesis (which has improved).  EEG negative for seizure, though if sx recur,  would recommend antiepileptics.  Migraine headache up to 3x/week. Had a headache in ambulance on the way to the hospital  On xarelto prior to admission. Now on xarelto for secondary stroke prevention.  Work up completed.  Hx right lenticular nucleus infarct 08/02/2012, embolic secondary to afib Recent stroke admission last week, Feb 05, 2013 where MRI neg for acute stroke, classified as TIA, negative for UTI, no sz workup  atrial fibrillation  Hypertension Hyperlipidemia, LDL 94, on lipitor 40 PTA, now on lipitor 40, goal LDL < 100 (< 70 for diabetics)   Hospital day # 2  TREATMENT/PLAN  Continue xarelto for secondary stroke prevention.  Should the same symptoms recur, would suspect seizure and recommend  antiepileptics.  Add low dose depakote to help with headache as well as can address possible seizures - 250 mg daily  Agree with CIR at discharge. Medically ready when bed available.  Annie Main, MSN, RN, ANVP-BC, ANP-BC, [facility] Pager: [phone] 02/14/2013 10:46 AM  I have personally examined this patient, reviewed the chart and pertinent data, developed the plan of care and agree with above Delia Heady, MD,

## 2013-02-15 ENCOUNTER — Encounter (HOSPITAL_COMMUNITY): Payer: Self-pay | Admitting: *Deleted

## 2013-02-15 ENCOUNTER — Inpatient Hospital Stay (HOSPITAL_COMMUNITY)
Admission: RE | Admit: 2013-02-15 | Discharge: 2013-03-03 | DRG: 945 | Disposition: A | Discharge status: Home Health | Payer: Medicare Other | Source: Intra-hospital | Attending: Physical Medicine & Rehabilitation | Admitting: Physical Medicine & Rehabilitation

## 2013-02-15 DIAGNOSIS — Z800 Family history of malignant neoplasm of digestive organs: Secondary | ICD-10-CM | POA: Diagnosis not present

## 2013-02-15 DIAGNOSIS — I639 Cerebral infarction, unspecified: Secondary | ICD-10-CM

## 2013-02-15 DIAGNOSIS — G3184 Mild cognitive impairment, so stated: Secondary | ICD-10-CM | POA: Diagnosis present

## 2013-02-15 DIAGNOSIS — Z9089 Acquired absence of other organs: Secondary | ICD-10-CM | POA: Diagnosis not present

## 2013-02-15 DIAGNOSIS — R197 Diarrhea, unspecified: Secondary | ICD-10-CM | POA: Diagnosis not present

## 2013-02-15 DIAGNOSIS — Z961 Presence of intraocular lens: Secondary | ICD-10-CM

## 2013-02-15 DIAGNOSIS — Z79899 Other long term (current) drug therapy: Secondary | ICD-10-CM

## 2013-02-15 DIAGNOSIS — I4891 Unspecified atrial fibrillation: Secondary | ICD-10-CM | POA: Diagnosis present

## 2013-02-15 DIAGNOSIS — E785 Hyperlipidemia, unspecified: Secondary | ICD-10-CM | POA: Diagnosis present

## 2013-02-15 DIAGNOSIS — Z8673 Personal history of transient ischemic attack (TIA), and cerebral infarction without residual deficits: Secondary | ICD-10-CM | POA: Diagnosis not present

## 2013-02-15 DIAGNOSIS — Z803 Family history of malignant neoplasm of breast: Secondary | ICD-10-CM

## 2013-02-15 DIAGNOSIS — Z9849 Cataract extraction status, unspecified eye: Secondary | ICD-10-CM | POA: Diagnosis not present

## 2013-02-15 DIAGNOSIS — Z808 Family history of malignant neoplasm of other organs or systems: Secondary | ICD-10-CM | POA: Diagnosis not present

## 2013-02-15 DIAGNOSIS — H9190 Unspecified hearing loss, unspecified ear: Secondary | ICD-10-CM | POA: Diagnosis present

## 2013-02-15 DIAGNOSIS — G43909 Migraine, unspecified, not intractable, without status migrainosus: Secondary | ICD-10-CM | POA: Diagnosis present

## 2013-02-15 DIAGNOSIS — Z860100 Personal history of colon polyps, unspecified: Secondary | ICD-10-CM

## 2013-02-15 DIAGNOSIS — R29810 Facial weakness: Secondary | ICD-10-CM | POA: Diagnosis present

## 2013-02-15 DIAGNOSIS — I10 Essential (primary) hypertension: Secondary | ICD-10-CM | POA: Diagnosis present

## 2013-02-15 DIAGNOSIS — Z7901 Long term (current) use of anticoagulants: Secondary | ICD-10-CM | POA: Diagnosis not present

## 2013-02-15 DIAGNOSIS — R569 Unspecified convulsions: Secondary | ICD-10-CM

## 2013-02-15 DIAGNOSIS — R630 Anorexia: Secondary | ICD-10-CM | POA: Diagnosis present

## 2013-02-15 DIAGNOSIS — Y93E8 Activity, other personal hygiene: Secondary | ICD-10-CM

## 2013-02-15 DIAGNOSIS — Z5189 Encounter for other specified aftercare: Secondary | ICD-10-CM | POA: Diagnosis not present

## 2013-02-15 DIAGNOSIS — I6350 Cerebral infarction due to unspecified occlusion or stenosis of unspecified cerebral artery: Secondary | ICD-10-CM | POA: Diagnosis not present

## 2013-02-15 DIAGNOSIS — R296 Repeated falls: Secondary | ICD-10-CM | POA: Diagnosis not present

## 2013-02-15 DIAGNOSIS — S42033A Displaced fracture of lateral end of unspecified clavicle, initial encounter for closed fracture: Secondary | ICD-10-CM | POA: Diagnosis not present

## 2013-02-15 DIAGNOSIS — Z87891 Personal history of nicotine dependence: Secondary | ICD-10-CM | POA: Diagnosis not present

## 2013-02-15 DIAGNOSIS — M25519 Pain in unspecified shoulder: Secondary | ICD-10-CM | POA: Diagnosis not present

## 2013-02-15 DIAGNOSIS — K5900 Constipation, unspecified: Secondary | ICD-10-CM | POA: Diagnosis present

## 2013-02-15 DIAGNOSIS — G43109 Migraine with aura, not intractable, without status migrainosus: Secondary | ICD-10-CM | POA: Diagnosis not present

## 2013-02-15 DIAGNOSIS — I4892 Unspecified atrial flutter: Secondary | ICD-10-CM | POA: Diagnosis not present

## 2013-02-15 DIAGNOSIS — Y9210 Unspecified residential institution as the place of occurrence of the external cause: Secondary | ICD-10-CM | POA: Diagnosis not present

## 2013-02-15 DIAGNOSIS — G8190 Hemiplegia, unspecified affecting unspecified side: Secondary | ICD-10-CM | POA: Diagnosis present

## 2013-02-15 MED ORDER — ATORVASTATIN CALCIUM 40 MG PO TABS
40.0000 mg | ORAL_TABLET | Freq: Every day | ORAL | Status: DC
  Filled 2013-02-15: qty 1

## 2013-02-15 MED ORDER — BACITRACIN-NEOMYCIN-POLYMYXIN OINTMENT TUBE
TOPICAL_OINTMENT | CUTANEOUS | Status: DC | PRN
  Administered 2013-02-15: 1 via TOPICAL
  Filled 2013-02-15: qty 15

## 2013-02-15 MED ORDER — ONDANSETRON HCL 4 MG PO TABS
4.0000 mg | ORAL_TABLET | Freq: Four times a day (QID) | ORAL | Status: DC | PRN
  Administered 2013-02-22 – 2013-02-26 (×2): 4 mg via ORAL
  Filled 2013-02-15 (×2): qty 1

## 2013-02-15 MED ORDER — ASPIRIN-ACETAMINOPHEN-CAFFEINE 250-250-65 MG PO TABS
2.0000 | ORAL_TABLET | Freq: Every day | ORAL | Status: DC | PRN
  Filled 2013-02-15: qty 2

## 2013-02-15 MED ORDER — SORBITOL 70 % SOLN
30.0000 mL | Freq: Every day | Status: DC | PRN
  Administered 2013-02-22 – 2013-02-25 (×3): 30 mL via ORAL
  Filled 2013-02-15 (×3): qty 30

## 2013-02-15 MED ORDER — DIVALPROEX SODIUM ER 250 MG PO TB24: 250.0000 mg | ORAL_TABLET | Freq: Every day | ORAL | Status: DC

## 2013-02-15 MED ORDER — SALINE SPRAY 0.65 % NA SOLN
1.0000 | NASAL | Status: DC | PRN
  Filled 2013-02-15: qty 44

## 2013-02-15 MED ORDER — ADULT MULTIVITAMIN W/MINERALS CH
1.0000 | ORAL_TABLET | Freq: Every day | ORAL | Status: DC
  Administered 2013-02-16 – 2013-02-27 (×12): 1 via ORAL
  Filled 2013-02-15 (×13): qty 1

## 2013-02-15 MED ORDER — ONDANSETRON HCL 4 MG/2ML IJ SOLN: 4.0000 mg | Freq: Four times a day (QID) | INTRAMUSCULAR | Status: DC | PRN

## 2013-02-15 MED ORDER — AMLODIPINE BESYLATE 2.5 MG PO TABS
2.5000 mg | ORAL_TABLET | Freq: Every day | ORAL | Status: DC
  Administered 2013-02-16 – 2013-02-28 (×3): 2.5 mg via ORAL
  Filled 2013-02-15 (×17): qty 1

## 2013-02-15 MED ORDER — ATORVASTATIN CALCIUM 40 MG PO TABS
40.0000 mg | ORAL_TABLET | Freq: Every day | ORAL | Status: DC
  Administered 2013-02-16 – 2013-03-02 (×15): 40 mg via ORAL
  Filled 2013-02-15 (×16): qty 1

## 2013-02-15 MED ORDER — ACETAMINOPHEN 325 MG PO TABS
325.0000 mg | ORAL_TABLET | ORAL | Status: DC | PRN
  Administered 2013-02-21 – 2013-02-23 (×2): 650 mg via ORAL
  Administered 2013-02-26: 325 mg via ORAL
  Filled 2013-02-15 (×2): qty 2
  Filled 2013-02-15: qty 1
  Filled 2013-02-15: qty 2

## 2013-02-15 MED ORDER — RIVAROXABAN 15 MG PO TABS
15.0000 mg | ORAL_TABLET | Freq: Every day | ORAL | Status: DC
  Administered 2013-02-16 – 2013-03-02 (×15): 15 mg via ORAL
  Filled 2013-02-15 (×16): qty 1

## 2013-02-15 MED ORDER — DIVALPROEX SODIUM ER 250 MG PO TB24
250.0000 mg | ORAL_TABLET | Freq: Every day | ORAL | Status: DC
  Administered 2013-02-16 – 2013-03-03 (×16): 250 mg via ORAL
  Filled 2013-02-15 (×17): qty 1

## 2013-02-15 NOTE — H&P (Signed)
Physical Medicine and Rehabilitation Admission H&P  Chief Complaint   Patient presents with   .  Code Stroke   .  Loss of Consciousness    : Chief complaint: Weakness  HPI: [name] is a 79 y.o. 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 Admitted 02/12/2013 with new onset of left facial droop as well as left-sided weakness. Recent admit 02/05/2013 for recurrent left-sided weakness with MRI showing expected evolution of right corona radiata lentiform nuclei infarct which did occur in May no new acute abnormalities. Followup MRI 02/12/2013 again showing no evidence of acute infarct. Patient did not receive TPA. EEG negative for seizure. MRI lumbar spine showed no unilateral abnormality to explain left hemiplegia. Neurology followup patient remains on Xarelto for CVA prophylaxis workup indicated small subcortical infarct small vessel involvement not seen on MRI. Noted intermittent bouts of headache placed on low-dose Depakote.  Patient is on a regular consistency diet. Physical therapy evaluation completed 02/13/2013 with recommendations of physical medicine rehabilitation consult to consider inpatient rehabilitation services. Patient was felt to be appropriate for inpatient rehabilitation services and was admitted for comprehensive rehabilitation program.  ROS Review of Systems  HENT: Positive for hearing loss.  Migraine headaches  Cardiovascular: Positive for palpitations.  Musculoskeletal: Positive for joint pain and myalgias.  All other systems reviewed and are negative  Past Medical History   Diagnosis  Date   .  Arthritis    .  Hypertension    .  Migraine    .  Colon polyp    .  Hearing difficulty    .  Torus palatinus    .  Atrial flutter  January, 2012   .  Stroke  08/02/12     right lenticular nucleus infarct   .  Left leg weakness  02/05/2013   .  Chronic  anticoagulation    .  High cholesterol     Past Surgical History   Procedure  Laterality  Date   .  Cataract extraction w/ intraocular lens implant, bilateral   2006-2008   .  Ganglion cyst excision  Bilateral  1938,1954,2003,2005     "wrists/hand" (08/01/2012)   .  Cardioversion   05/19/2010     Dr. Jacinto Halim   .  Tonsillectomy   ~ 1935   .  Appendectomy   02/19/53     `    Family History   Problem  Relation  Age of Onset   .  Colon cancer     .  Breast cancer     .  Brain cancer     .  Cancer  Mother      breast   .  Aneurysm  Mother      brain   .  Heart attack  Neg Hx    .  Diabetes  Neg Hx    .  Hypertension  Neg Hx     Social History: reports that she has quit smoking. She has never used smokeless tobacco. She reports that she drinks alcohol. She reports that she does not use illicit drugs.  Allergies:  Allergies   Allergen  Reactions   .  Tramadol Hcl  Other (See Comments)     lethargy, nausea   .  Alendronate Sodium  Rash and Other (See Comments)     puffy eyes    Medications Prior to Admission   Medication  Sig  Dispense  Refill   .  amLODipine (NORVASC) 2.5 MG tablet  Take 2.5-5 mg by mouth daily as needed. Takes 1 tablet if SBP >150; 2 tablets if SPB . 175 or DBP > 100.     Marland Kitchen  aspirin-acetaminophen-caffeine (EXCEDRIN MIGRAINE) 250-250-65 MG per tablet  Take 2 tablets by mouth daily as needed for pain (for migraines).     Marland Kitchen  atorvastatin (LIPITOR) 40 MG tablet  Take 1 tablet (40 mg total) by mouth daily at 6 PM.  30 tablet  0   .  midodrine (PROAMATINE) 5 MG tablet  Take 5 mg by mouth 3 (three) times daily as needed (for low BP). While awake for dizziness     .  Multiple Vitamin (MULTIVITAMIN WITH MINERALS) TABS  Take 1 tablet by mouth daily.     .  Rivaroxaban (XARELTO) 15 MG TABS tablet  Take 1 tablet (15 mg total) by mouth daily with supper.  30 tablet  3    Home:  Home Living  Family/patient expects to be discharged to:: Private residence  Living Arrangements:  Children  Available Help at Discharge: Family;Available PRN/intermittently (all but 4-5 hours a day)  Type of Home: House  Home Access: Stairs to enter  [employer] of Steps: 1  Entrance Stairs-Rails: None  Home Layout: Two level;Bed/bath upstairs  Alternate Level Stairs-Number of Steps: 15  Alternate Level Stairs-Rails: Right;Left  Home Equipment: Walker - 2 wheels;Shower seat;Transport chair;Grab bars - tub/shower  Additional Comments: pt not wanting SNF-daughters report they can figure out 24/7 assistance  Functional History:  Prior Function  Comments: daughters provided supervision for bathing and assisted with giong up and down stairs  Functional Status:  Mobility:  Bed Mobility  Bed Mobility: Rolling Right;Right Sidelying to Sit;Sitting - Scoot to [employer] of Bed;Sit to Sidelying Right  Rolling Right: 3: Mod assist  Right Sidelying to Sit: 3: Mod assist  Supine to Sit: 3: Mod assist  Sit to Supine: 3: Mod assist  Sit to Sidelying Right: 3: Mod assist  Transfers  Transfers: Not assessed  Sit to Stand: 3: Mod assist;From bed;From chair/3-in-1  Stand to Sit: 3: Mod assist  Stand Pivot Transfers: 1: +1 Total assist  Ambulation/Gait  Ambulation/Gait Assistance: Not tested (comment)   ADL:   Cognition:  Cognition  Overall Cognitive Status: Within Functional Limits for tasks assessed  Orientation Level: Oriented X4  Cognition  Arousal/Alertness: Awake/alert  Behavior During Therapy: WFL for tasks assessed/performed  Overall Cognitive Status: Within Functional Limits for tasks assessed  Physical Exam:  Blood pressure 128/73, pulse 79, temperature 98.2 F (36.8 C), temperature source Oral, resp. rate 18, height 5\' 7"  (1.702 m), weight 54.885 kg (121 lb), SpO2 96.00%.  Physical Exam  Constitutional: She is oriented to person, place, and time.  HENT:  Head: Normocephalic.  Eyes: EOM are normal.  Neck: Normal range of motion. Neck supple. No thyromegaly present.   Cardiovascular:  Cardiac rate controlled  Respiratory: Effort normal and breath sounds normal. No respiratory distress.  GI: Soft. Bowel sounds are normal. She exhibits no distension.  Neurological: She is alert and oriented to person, place, and time.  Patient follows full commands  Skin: Skin is warm and dry.  Psychiatric: She has a normal mood and affect.  motor strength 4 minus left deltoid, bicep, tricep, grip  3 minus left hip flexor, left hip extensor, 2 minus at the ankle dorsiflexor and plantar flexor  Right side is 4/5 in the upper and lower limb  Sensory intact to light touch on the right side of the body and reduced on the left upper and lower limb No evidence of facial droop  Cerebellar no dysmetria on finger nose to finger testing, heel to shin testing could not be performed on the left side secondary to weakness   Eyes without evidence of nystagmus  Tone is normal without evidence of spasticity  Cranial nerves II- Visual fields are intact to confrontation testing, no blurring of vision III- no evidence of ptosis, upward, downward and medial gaze intact IV- no vertical diplopia or head tilt V- no facial numbness or masseter weakness VI- no pupil abduction weakness VII- no facial droop, good lid closure VII- normal auditory acuity IX- no pharygeal weakness X- no pharyngeal weakness, no hoarseness XI- no trap or SCM weakness XII- no glossal weakness  Results for orders placed during the hospital encounter of 02/12/13 (from the past 48 hour(s))   CBC Status: None    Collection Time    02/13/13 9:05 AM   Result  Value  Range    WBC  5.7  4.0 - 10.5 K/uL    RBC  4.22  3.87 - 5.11 MIL/uL    Hemoglobin  12.6  12.0 - 15.0 g/dL    Comment:  REPEATED TO VERIFY    HCT  38.0  36.0 - 46.0 %    MCV  90.0  78.0 - 100.0 fL    MCH  29.9  26.0 - 34.0 pg    MCHC  33.2  30.0 - 36.0 g/dL    RDW  16.1  09.6 - 04.5 %    Platelets  190  150 - 400 K/uL   GLUCOSE, CAPILLARY  Status: None    Collection Time    02/13/13 11:29 AM   Result  Value  Range    Glucose-Capillary  91  70 - 99 mg/dL   GLUCOSE, CAPILLARY Status: Abnormal    Collection Time    02/13/13 4:45 PM   Result  Value  Range    Glucose-Capillary  110 (*)  70 - 99 mg/dL   GLUCOSE, CAPILLARY Status: None    Collection Time    02/13/13 10:01 PM   Result  Value  Range    Glucose-Capillary  97  70 - 99 mg/dL    Comment 1  Documented in Chart     Comment 2  Notify RN    GLUCOSE, CAPILLARY Status: Abnormal    Collection Time    02/14/13 6:26 AM   Result  Value  Range    Glucose-Capillary  106 (*)  70 - 99 mg/dL    Comment 1  Documented in Chart     Comment 2  Notify RN     Mr Lumbar Spine Wo Contrast  02/14/2013 CLINICAL DATA: Left hemiplegia not associated with stroke. EXAM: MRI LUMBAR SPINE WITHOUT CONTRAST TECHNIQUE: Multiplanar, multisequence MR imaging was performed. No intravenous contrast was administered. COMPARISON: None. FINDINGS: No marrow signal abnormality suggestive of fracture or neoplasm. Hyperintensity within the inferior endplate of L1 and around the T11-T12 anterior disc is likely degenerative. Normal conus signal and morphology. No left-sided nerve thickening. No extra-spinal findings to explain hemiplegia. There is a tiny (4 mm) T2 hyperintense lesion in the lower left kidney, likely a cyst. Degenerative changes: L1-L2: No nerve impingement. L2-L3: Posterior annular fissure. Disc bulging combined with dorsal ligamentous and facet overgrowth narrows the lateral recesses. No foraminal stenosis. L3-L4: Mild retrolisthesis. Degenerative facet and ligamentous overgrowth combined  with disc bulging and central herniation cause moderate to advanced canal stenosis, with near complete effacement of CSF. The lateral recesses are particularly stenotic. The inferior foramina are effaced. L4-L5: Facet osteoarthritis with bony and ligamentous overgrowth, causing moderate encroachment on the foramina.  The lateral recesses are also narrowed, without nerve compression. L5-S1:Degenerative disc narrowing. No nerve compression. IMPRESSION: 1. No unilateral abnormality to explain left hemiplegia. 2. Moderate to advanced spinal canal stenosis at L3-4, secondary to both disc and facet degeneration. 3. Mild degenerative lateral recess stenosis bilaterally at L2-3 and L4-5. Electronically Signed By: Tiburcio Pea M.D. On: 02/14/2013 04:04   Post Admission Physician Evaluation:  1. Functional deficits secondary to  small right subcortical stroke causing left lower greater than left upper extremity weakness as well as left hemi-sensory deficits . 2. Patient is admitted to receive collaborative, interdisciplinary care between the physiatrist, rehab nursing staff, and therapy team. 3. Patient's level of medical complexity and substantial therapy needs in context of that medical necessity cannot be provided at a lesser intensity of care such as a SNF. 4. Patient has experienced substantial functional loss from his/her baseline which was documented above under the "Functional History" and "Functional Status" headings. Judging by the patient's diagnosis, physical exam, and functional history, the patient has potential for functional progress which will result in measurable gains while on inpatient rehab. These gains will be of substantial and practical use upon discharge in facilitating mobility and self-care at the household level. 5. Physiatrist will provide 24 hour management of medical needs as well as oversight of the therapy plan/treatment and provide guidance as appropriate regarding the interaction of the two. 6. 24 hour rehab nursing will assist with bladder management, bowel management, safety, skin/wound care, disease management, medication administration, pain management and patient education and help integrate therapy concepts, techniques,education, etc. 7. PT will assess and treat for/with: pre gait, gait  training, endurance , safety, equipment, neuromuscular re education. Goals are: Sup. 8. OT will assess and treat for/with: ADLs, Cognitive perceptual skills, Neuromuscular re education, safety, endurance, equipment. Goals are: Sup. 9. SLP will assess and treat for/with: NA. Goals are: NA. 10. Case Management and Social Worker will assess and treat for psychological issues and discharge planning. 11. Team conference will be held weekly to assess progress toward goals and to determine barriers to discharge. 12. Patient will receive at least 3 hours of therapy per day at least 5 days per week. 13. ELOS: 10-12 days  14. Prognosis: excellent Medical Problem List and Plan:  1. Suspect small subcortical CVA not identified on MRI. Recent right lenticular nucleus infarct May 2014 with little residual  2. DVT Prophylaxis/Anticoagulation: Xarelto. Monitor for any bleeding episodes  3. Pain Management/headaches. Continue low-dose Depakote.  4. Neuropsych: This patient is  capable of making decisions on her own behalf.  5. Hypertension. Norvasc 2.5 mg daily. Monitor with increased mobility  6. Hyperlipidemia. Lipitor   Erick Colace M.D. [facility] FAAPM&R (Sports Med, Neuromuscular Med) Diplomate Am Board of Electrodiagnostic Med Diplomate Am Board of Pain Medicine Fellow Am Board of Interventional Pain Physicians  02/15/2013

## 2013-02-15 NOTE — Progress Notes (Signed)
Patient admit to inpatient rehab approximately at 1815.  Patient alert and oriented x4, two daughters at the bedside. Patient/daughters oriented to the unit, call bell within reach, and patient is resting comfortably.

## 2013-02-15 NOTE — Progress Notes (Signed)
Rehab admissions - Evaluated for possible admission.  I met with patient and her 2 daughters at the bedside.  All are in agreement to inpatient rehab.  Bed available and will admit to inpatient rehab today.  Call me for questions.  #[phone]

## 2013-02-15 NOTE — PMR Pre-admission (Signed)
PMR Admission Coordinator Pre-Admission Assessment  Patient: [name] is an 79 y.o., female MRN: [id number] DOB: 10-27-1924 Height: 5\' 7"  (170.2 cm) Weight: 54.885 kg (121 lb)              Insurance Information HMO:      PPO:       PCP:       IPA:       80/20:       OTHER:   PRIMARY: Medicare A/B      Policy#: [id number]      Subscriber: Edwena Felty CM Name:        Phone#:       Fax#:   Pre-Cert#:        Employer: Retired Benefits:  Phone #:       Name: Armed forces technical officer. Date: 10/28/91     Deduct: $1216      Out of Pocket Max: none      Life Max: unlimited CIR: 100%      SNF: 100 days Outpatient: 80%     Co-Pay: 20% Home Health: 100%      Co-Pay: none DME: 80%     Co-Pay: 20% Providers: patient's choice  SECONDARY: [employer]      Policy#: [id number]      Subscriber: Edwena Felty CM Name:        Phone#:       Fax#:   Pre-Cert#:        Employer: Retired Benefits:  Phone #: [phone]     Name:   Eff. Date:       Deduct:        Out of Pocket Max:        Life Max:   CIR:        SNF:   Outpatient:       Co-Pay:   Home Health:        Co-Pay:   DME:       Co-Pay:    Emergency Contact Information Contact Information   Name Relation Home Work Mobile   Plummer,Kim Daughter  [phone]   Laketta, Soderberg Daughter   [phone]   Latin,Kit Daughter        Current Medical History  Patient Admitting Diagnosis: Left hemiparesis question TIA vs seizure, appears to be improving   History of Present Illness: An 79 y.o. 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 Admitted 02/12/2013 with new onset of left facial droop as well as left-sided weakness. Recent admit 02/05/2013 for recurrent left-sided weakness with MRI showing expected evolution of right corona radiata lentiform nuclei infarct which did occur in May no new acute abnormalities. Followup MRI 02/12/2013 again  showing no evidence of acute infarct. Patient did not receive TPA. MRI lumbar spine showed no unilateral abnormality to explain left hemiplegia. Neurology followup patient remains on Xarelto for CVA prophylaxis workup indicated of likely subcortical small vessel infarct. Patient is on a regular consistency diet. Physical therapy evaluation completed 02/13/2013 with recommendations of physical medicine rehabilitation consult to consider inpatient rehabilitation services.  Patient and family report that the left arm was initially flaccid. She is now regained movement into gravity. Her left leg remains week however improved compared to admission.   Total: 5=NIH  Past Medical History  Past Medical History  Diagnosis Date  . Arthritis   . Hypertension   . Migraine   . Colon polyp   . Hearing  difficulty   . Torus palatinus   . Atrial flutter January, 2012  . Stroke 08/02/12     right lenticular nucleus infarct  . Left leg weakness 02/05/2013  . Chronic anticoagulation   . High cholesterol     Family History  family history includes Aneurysm in her mother; Brain cancer in an other family member; Breast cancer in an other family member; Cancer in her mother; Colon cancer in an other family member. There is no history of Heart attack, Diabetes, or Hypertension.  Prior Rehab/Hospitalizations: Had HH therapies after CVA in 05/14   Current Medications  Current facility-administered medications:amLODipine (NORVASC) tablet 2.5 mg, 2.5 mg, Oral, Daily, Penny Pia, MD, 2.5 mg at 02/14/13 1114;  aspirin-acetaminophen-caffeine (EXCEDRIN MIGRAINE) per tablet 2 tablet, 2 tablet, Oral, Daily PRN, Penny Pia, MD, 2 tablet at 02/15/13 0640;  atorvastatin (LIPITOR) tablet 40 mg, 40 mg, Oral, q1800, Penny Pia, MD, 40 mg at 02/14/13 1700 divalproex (DEPAKOTE ER) 24 hr tablet 250 mg, 250 mg, Oral, Daily, Layne Benton, NP, 250 mg at 02/15/13 1001;  multivitamin with minerals tablet 1 tablet, 1 tablet, Oral,  Daily, Penny Pia, MD, 1 tablet at 02/15/13 1001;  Rivaroxaban (XARELTO) tablet 15 mg, 15 mg, Oral, Q supper, Penny Pia, MD, 15 mg at 02/14/13 1657;  sodium chloride (OCEAN) 0.65 % nasal spray 1 spray, 1 spray, Each Nare, PRN, Maryann Mikhail, DO sodium chloride 0.9 % injection 3 mL, 3 mL, Intravenous, Q12H, Penny Pia, MD, 3 mL at 02/14/13 2114  Patients Current Diet: Cardiac  Precautions / Restrictions Precautions Precautions: Fall Restrictions Weight Bearing Restrictions: No   Prior Activity Level Limited Community (1-2x/wk): Went out about 2-3 X a week.  Goes to breakfast and often to dinner on Saturdays  Home Assistive Devices / Equipment Home Assistive Devices/Equipment: Dan Humphreys (specify type);Wheelchair;Dentures (specify type);Eyeglasses Home Equipment: Walker - 2 wheels;Shower seat;Transport chair;Grab bars - tub/shower  Prior Functional Level Prior Function Comments: daughters provided supervision for bathing and assisted with giong up and down stairs  Current Functional Level Cognition  Overall Cognitive Status: Within Functional Limits for tasks assessed Orientation Level: Oriented X4    Extremity Assessment (includes Sensation/Coordination)          ADLs       Mobility  Bed Mobility: Rolling Right;Right Sidelying to Sit;Sitting - Scoot to [place] of Bed;Sit to Sidelying Right Rolling Right: 3: Mod assist Right Sidelying to Sit: 3: Mod assist Supine to Sit: 3: Mod assist Sit to Supine: 3: Mod assist Sit to Sidelying Right: 3: Mod assist    Transfers  Transfers: Not assessed Sit to Stand: 3: Mod assist;From bed;From chair/3-in-1 Stand to Sit: 3: Mod assist Stand Pivot Transfers: 1: +1 Total assist    Ambulation / Gait / Stairs / Wheelchair Mobility  Ambulation/Gait Ambulation/Gait Assistance: Not tested (comment)    Posture / Balance Static Sitting Balance Static Sitting - Balance Support: Feet supported Static Sitting - Level of Assistance: 5:  Stand by assistance;4: Min assist Static Sitting - Comment/# of Minutes: Better trunk control today, able to sit supervision with Cues to correct posture Dynamic Sitting Balance Dynamic Sitting - Balance Support: Feet supported;During functional activity Dynamic Sitting - Level of Assistance: 5: Stand by assistance;4: Min assist Dynamic Sitting - Balance Activities: Lateral lean/weight shifting;Forward lean/weight shifting;Reaching for objects;Reaching across midline;Trunk control activities Dynamic Sitting - Comments: Pt with improved trunk control today, easily fatigues    Special needs/care consideration BiPAP/CPAP No CPM No Continuous Drip IV No Dialysis No  Life Vest No Oxygen No Special Bed No Trach Size No Wound Vac (area) No   Skin Some bruising on arms and irritation from electrodes on her chest                            Bowel mgmt: Had BM today, 02/15/13 Bladder mgmt: Voiding on bedpan and on BSC Diabetic mgmt No    Previous Home Environment Living Arrangements: Children Available Help at Discharge: Family;Available PRN/intermittently (all but 4-5 hours a day) Type of Home: House Home Layout: Two level;Bed/bath upstairs Alternate Level Stairs-Rails: Right;Left Alternate Level Stairs-Number of Steps: 15 Home Access: Stairs to enter Entrance Stairs-Rails: None Entrance Stairs-Number of Steps: 1 Home Care Services: No Additional Comments: pt not wanting SNF-daughters report they can figure out 24/7 assistance  Discharge Living Setting Plans for Discharge Living Setting: House;Lives with (comment) (Lives with 3 daughters.) Type of Home at Discharge: House Discharge Home Layout: Two level;Laundry or work area in basement;Bed/bath upstairs Alternate Teacher, music of Steps: 15 (Dtrs thinking about a Theatre manager.) Discharge Home Access: Stairs to enter Entrance Stairs-Number of Steps: 1 step at garage entry Does the patient have any problems obtaining your  medications?: No  Social/Family/Support Systems Patient Roles: Parent (Has 3 daughters.) Contact Information: Hadley Pen - daughter is medical POA [phone] Anticipated Caregiver: Daughters Anticipated Caregiver's Contact Information: See emergency contacts Ability/Limitations of Caregiver: Daughters provide supervision for most of the day except for about 4-5 hours Caregiver Availability: Other (Comment) (Available all but about 4-5 hrs a day.) Discharge Plan Discussed with Primary Caregiver: Yes Is Caregiver In Agreement with Plan?: Yes Does Caregiver/Family have Issues with Lodging/Transportation while Pt is in Rehab?: No  Goals/Additional Needs Patient/Family Goal for Rehab: PT/OT Supervision, no ST goals Expected length of stay: 8-10 days Cultural Considerations: None Dietary Needs: Heart diet, thin liquids Equipment Needs: TBD Pt/Family Agrees to Admission and willing to participate: Yes Program Orientation Provided & Reviewed with Pt/Caregiver Including Roles  & Responsibilities: Yes   Decrease burden of Care through IP rehab admission:  N/A   Possible need for SNF placement upon discharge: Not planned   Patient Condition: This patient's condition remains as documented in the consult dated 02/14/13, in which the Rehabilitation Physician determined and documented that the patient's condition is appropriate for intensive rehabilitative care in an inpatient rehabilitation facility pending continued need for therapies. These areas have been addressed. Currently requiring mod assist for transfers.  Will admit to inpatient rehab today.  Preadmission Screen Completed By:  Trish Mage, 02/15/2013 10:12 AM ______________________________________________________________________   Discussed status with Dr. Wynn Banker on 02/15/13 at 1027 and received telephone approval for admission today.  Admission Coordinator:  Trish Mage, time1030/Date11/20/14

## 2013-02-15 NOTE — Progress Notes (Signed)
Physical Therapy Treatment Patient Details Name: [name] MRN: [id number] DOB: 02/20/1925 Today's Date: 02/15/2013 Time: 4098-1191 PT Time Calculation (min): 26 min  PT Assessment / Plan / Recommendation  History of Present Illness [name] is an 79 y.o. female history of atrial fibrillation, hypertension, hyperlipidemia and previous stroke presenting with new onset left facial droop and left hemiparesis as well as numbness on the left side. Patient was last known well at 6:30 AM today 02/12/2013. She's been on Xarelto for anticoagulation. She was admitted here one week ago for leg weakness of acute onset. MRI showed no evidence of recurrent acute stroke. NIH stroke scale today was 9. Exam findings indicated probable subcortical right recurrent small vessel TIA or infarction. Patient was not a TPA candidate secondary to being on xarelto. She was also felt to not be a candidate for interventional radiology because deficits were indicative of likely subcortical small vessel involvement with no indications of proximal MCA or ICA occlusion. NIH stroke score was 9.  She was admitted for further evaluation and treatment.   PT Comments   Patient continues to make progress towards goals. Patient perform some standing activities with mod assist today. Will continue to progress as tolerated.   Follow Up Recommendations  CIR;Supervision/Assistance - 24 hour           Equipment Recommendations   (TBD)       Frequency Min 4X/week   Progress towards PT Goals Progress towards PT goals: Progressing toward goals  Plan Current plan remains appropriate    Precautions / Restrictions Precautions Precautions: Fall Restrictions Weight Bearing Restrictions: No   Pertinent Vitals/Pain No pain reported    Mobility  Bed Mobility Bed Mobility: Rolling Right;Right Sidelying to Sit;Sitting - Scoot to [place] of Bed Rolling Right: 3: Mod assist Right Sidelying to Sit: 3: Mod assist Supine to Sit: 3:  Mod assist Details for Bed Mobility Assistance: Assist for LLE and trunk, assist to rotate hips to EOB Transfers Transfers: Sit to Stand;Stand to Sit;Stand Pivot Transfers Sit to Stand: 3: Mod assist;From bed;From chair/3-in-1 Stand to Sit: 3: Mod assist Stand Pivot Transfers: 1: +1 Total assist Details for Transfer Assistance: Face to face, patient able to push up through RLE but unable to transfer weight through LLE or use LLE for support Ambulation/Gait Ambulation/Gait Assistance: Not tested (comment) Modified Rankin (Stroke Patients Only) Pre-Morbid Rankin Score: Slight disability Modified Rankin: Severe disability    Exercises General Exercises - Lower Extremity Ankle Circles/Pumps: AROM;Both;10 reps   PT Diagnosis:    PT Problem List:   PT Treatment Interventions:     PT Goals (current goals can now be found in the care plan section) Acute Rehab PT Goals Patient Stated Goal: to be able to walk again PT Goal Formulation: With patient Time For Goal Achievement: 02/13/13 Potential to Achieve Goals: Good  Visit Information  Last PT Received On: 02/15/13 Assistance Needed: +2 History of Present Illness: [name] is an 79 y.o. female history of atrial fibrillation, hypertension, hyperlipidemia and previous stroke presenting with new onset left facial droop and left hemiparesis as well as numbness on the left side. Patient was last known well at 6:30 AM today 02/12/2013. She's been on Xarelto for anticoagulation. She was admitted here one week ago for leg weakness of acute onset. MRI showed no evidence of recurrent acute stroke. NIH stroke scale today was 9. Exam findings indicated probable subcortical right recurrent small vessel TIA or infarction. Patient was not a TPA candidate  secondary to being on xarelto. She was also felt to not be a candidate for interventional radiology because deficits were indicative of likely subcortical small vessel involvement with no indications of  proximal MCA or ICA occlusion. NIH stroke score was 9.  She was admitted for further evaluation and treatment.    Subjective Data  Subjective: I have to use the bathroom Patient Stated Goal: to be able to walk again   Cognition  Cognition Arousal/Alertness: Awake/alert Behavior During Therapy: WFL for tasks assessed/performed Overall Cognitive Status: Within Functional Limits for tasks assessed    Balance  Static Sitting Balance Static Sitting - Balance Support: Feet supported Static Sitting - Level of Assistance: 5: Stand by assistance;4: Min assist Dynamic Sitting Balance Dynamic Sitting - Balance Support: Feet supported;During functional activity Dynamic Sitting - Level of Assistance: 5: Stand by assistance Dynamic Sitting - Balance Activities: Lateral lean/weight shifting;Forward lean/weight shifting;Reaching for objects;Reaching across midline;Trunk control activities Dynamic Standing Balance Dynamic Standing - Balance Support: Bilateral upper extremity supported;During functional activity Dynamic Standing - Level of Assistance: 3: Mod assist Dynamic Standing - Balance Activities: Lateral lean/weight shifting;Forward lean/weight shifting Dynamic Standing - Comments: performed x5 for approximately 20-30 seconds each  End of Session PT - End of Session Equipment Utilized During Treatment: Gait belt Activity Tolerance: Patient tolerated treatment well Patient left: in chair;with call bell/phone within reach;with family/visitor present Nurse Communication: Mobility status   GP     Fabio Asa 02/15/2013, 10:29 AM Charlotte Crumb, PT DPT  [phone]

## 2013-02-15 NOTE — Discharge Summary (Signed)
Physician Discharge Summary  [name] ZOX:[id number] DOB: 1924-07-31 DOA: 02/12/2013  PCP: [name]., NP  Admit date: 02/12/2013 Discharge date: 02/15/2013  Time spent: 45 minutes  Recommendations for Outpatient Follow-up:  Patient will be discharged to inpatient rehabilitation. She is to followup with her primary care physician once discharged from inpatient rehabilitation. She will also need to follow up her neurologist. Patient continue physical therapy as well as speech and occupational therapy as recommended by inpatient rehabilitation. She should continue taking her medications as prescribed.  Discharge Diagnoses:  Principal Problem:   Stroke-like symptom Active Problems:   MIGRAINE HEADACHE   right brain stroke too small to be seen on MRI   HTN (hypertension)   Left leg weakness   Atrial fibrillation   Chronic anticoagulation   TIA (transient ischemic attack)   Hyperglycemia   Possible Seizures   Discharge Condition: Stable  Diet recommendation: Heart healthy  Filed Weights   02/12/13 0748  Weight: 54.885 kg (121 lb)    History of present illness:  [name] is a 79 y.o. female Pt awoke at approximately 0600 today and was normal at this time per daughter. Pt ambulated with full capacity to the restroom a couple times and proceeded with normal daily routine. Per daughter, at approximately 79, pt c/o of sudden onset severe left leg weakness as if "leg was asleep" and requested to sit down. Immediately after sitting, pt c/o of inability to move entire left side of body, then became unresponsive and incontinent. EMS was contacted. Pt became responsive, but still c/o of extreme left-sided weakness and disorientation. Now able to move all extremities, with some left-sided weakness still persisting. Also c/o mild headache. Pt does have hx of years of infrequent migraines without aura relieved effectively with Excedrin. Pt was in the hospital on 02/07/13 with  sudden-onset left leg weakness, but less severe than this episode.    Hospital Course:  [name] 79 year old female with past medical history of stroke in May of 2014 involving the right lenticular nucleus, atrial fibrillation on chronic anticoagulation, hypertension, migraine that present presents emergency department with complaints of left-sided weakness. Patient was recently seen approximately one week prior for similar presentation, her stroke workup at that time was negative. Patient was admitted for stroke like symptoms to rule out TIA versus migraine headache versus seizure today. Her MRI was negative for acute stroke. Neurology was consulted. EEG was conducted showing no evidence of an epileptic disorder.  It was felt that her left leg weakness is out of proportion to her left face and arm findings. It is possible that patient may have had a new small right brain stroke that was not seen on MRI as discussed with the neurologist. It is also possible the patient is having complicated migraines. Patient was started on low-dose Depakote for questionable seizures versus migraine. Patient has had a history of right lenticular nucleus infarct which was thought to be embolic secondary secondary to her A. Fib. Patient was continued on Zoloft though a during her admission and should be continued on for secondary stroke prevention. She was also continued on her Lipitor for hyperlipidemia. Her blood pressure did remain stable she was continued on her Norvasc. Her last hemoglobin A1c was only 5.9. Again at this time is not clear as to what her specific etiology is her symptoms. She will need to be monitored by a neurologist as well as her primary care physician. Physical therapy as well occupational therapy did see the patient and  did recommend inpatient rehabilitation for which the patient has been accepted. Patient was seen examined on day of discharge and found to be stable medically. This was discussed with the  patient as well as her daughter and do agree and understand the plan.  Procedures: EEG 02/12/2013 This is a normal EEG recording during wakefulness. No evidence of an epileptic disorder was demonstrated.  Consultations: Neurology Inpatient Rehab  Discharge Exam: Filed Vitals:   02/15/13 0955  BP: 120/74  Pulse: 76  Temp: 98 F (36.7 C)  Resp: 20    Exam  General: Well developed, well nourished, NAD, appears stated age  HEENT: NCAT, PERRLA, EOMI, Anicteic Sclera, mucous membranes moist.  Neck: Supple, no JVD, no masses  Cardiovascular: S1 S2 auscultated, no rubs, murmurs or gallops. Regular rate and rhythm.  Respiratory: Clear to auscultation bilaterally with equal chest rise  Abdomen: Soft, nontender, nondistended, + bowel sounds  Extremities: warm dry without cyanosis clubbing or edema  Neuro: AAOx3, cranial nerves grossly intact. Strength 5/5 in RUE and RLE. Strength 4/5 in LLE and LUE.  Skin: Without rashes exudates or nodules  Psych: Normal affect and demeanor with intact judgement and insight   Discharge Instructions   Future Appointments Provider Department Dept Phone   02/16/2013 11:15 AM Sandford Craze, NP [facility]  [facility] [phone]   03/14/2013 2:00 PM Ronal Fear, NP [facility] [phone]   03/19/2013 8:15 AM Sandford Craze, NP  [facility]  [facility] [phone]   07/18/2013 2:00 PM Ronal Fear, NP [facility] [phone]       Medication List    ASK your doctor about these medications       amLODipine 2.5 MG tablet  Commonly known as:  NORVASC  Take 2.5-5 mg by mouth daily as needed. Takes 1 tablet if SBP >150; 2 tablets if SPB . 175 or DBP > 100.     aspirin-acetaminophen-caffeine 250-250-65 MG per tablet  Commonly known as:  EXCEDRIN MIGRAINE  Take 2 tablets by mouth daily as needed for pain (for migraines).     atorvastatin 40 MG tablet  Commonly known as:  LIPITOR   Take 1 tablet (40 mg total) by mouth daily at 6 PM.     midodrine 5 MG tablet  Commonly known as:  PROAMATINE  Take 5 mg by mouth 3 (three) times daily as needed (for low BP). While awake for dizziness     multivitamin with minerals Tabs tablet  Take 1 tablet by mouth daily.     Rivaroxaban 15 MG Tabs tablet  Commonly known as:  XARELTO  Take 1 tablet (15 mg total) by mouth daily with supper.       Allergies  Allergen Reactions  . Tramadol Hcl Other (See Comments)     lethargy, nausea  . Alendronate Sodium Rash and Other (See Comments)    puffy eyes      The results of significant diagnostics from this hospitalization (including imaging, microbiology, ancillary and laboratory) are listed below for reference.    Significant Diagnostic Studies: Dg Chest 2 View  02/05/2013   CLINICAL DATA:  Stroke.  EXAM: CHEST  2 VIEW  COMPARISON:  07/02/2010.  FINDINGS: The heart remains normal in size. The lungs remain clear and hyperexpanded. Thoracic spine degenerative changes. Diffuse osteopenia.  IMPRESSION: Stable changes of COPD. No acute abnormality.   Electronically Signed   By: Gordan Payment M.D.   On: 02/05/2013 22:56   Ct Head Wo  Contrast  02/12/2013   CLINICAL DATA:  Left-sided weakness  EXAM: CT HEAD WITHOUT CONTRAST  TECHNIQUE: Contiguous axial images were obtained from the base of the skull through the vertex without intravenous contrast.  COMPARISON:  Brain MRI 02/06/2013 and head CT scan 02/05/2013.  FINDINGS: Extensive chronic microvascular ischemic change is again seen with a remote corona radiata infarct on the right identified. No evidence of acute abnormality including infarction, hemorrhage, mass lesion, mass effect, midline shift abnormal extra-axial fluid collection is identified. There is no hydrocephalus or pneumocephalus. The calvarium is intact.  IMPRESSION: No acute finding.  Atrophy, chronic microvascular ischemic change and remote corona radiata infarct, unchanged.   Critical Value/emergent results were called by telephone at the time of interpretation on 02/12/2013 at 7:41 AM to Dr.Stewart , who verbally acknowledged these results.   Electronically Signed   By: Drusilla Kanner M.D.   On: 02/12/2013 07:41   Ct Head Wo Contrast  02/05/2013   CLINICAL DATA:  Head pain post fall.  EXAM: CT HEAD WITHOUT CONTRAST  TECHNIQUE: Contiguous axial images were obtained from the base of the skull through the vertex without intravenous contrast.  COMPARISON:  08/01/2012 MR and CT.  FINDINGS: No skull fracture or intracranial hemorrhage.  Remote infarct right external capsule extending into the right coronal radiata.  Prominent small vessel disease type changes. No CT evidence of large acute infarct.  Global atrophy.  No intracranial mass lesion noted on this unenhanced exam.  IMPRESSION: No skull fracture or intracranial hemorrhage.  Remote infarct right external capsule extending into the right coronal radiata.  Prominent small vessel disease type changes. No CT evidence of large acute infarct.  Global atrophy   Electronically Signed   By: Bridgett Larsson M.D.   On: 02/05/2013 10:41   Mr Brain Wo Contrast  02/12/2013   CLINICAL DATA:  History of atrial fibrillation, hypertension, hyperlipidemia, and previous stroke presenting with new onset left facial droop and left hemiparesis as well as left-sided numbness. Last known well at 6:30 a.m. today. Admitted 1 week ago for leg weakness of acute onset without MR evidence of acute stroke.  EXAM: MRI HEAD WITHOUT CONTRAST  TECHNIQUE: Multiplanar, multiecho pulse sequences of the brain and surrounding structures were obtained without intravenous contrast.  COMPARISON:  CT head 02/12/2013.  Brain MRI 02/06/2013.  FINDINGS: There is no evidence of acute infarct. Encephalomalacia is again seen at the site of old right corona radiata/lentiform nucleus infarct. There is moderate generalized cerebral atrophy. Confluent regions of periventricular T2  hyperintensity and small amount of T2 hyperintensity in the pons do not appear significantly changed and are consistent with moderate chronic small vessel ischemic disease. Remote lacunar infarcts are again noted in the right cerebellum. There is no evidence of intracranial hemorrhage, mass, midline shift, or extra-axial fluid collection. Major intracranial flow voids are present. Prior bilateral cataract surgery is noted. Paranasal sinuses are clear.  IMPRESSION: 1. No evidence of acute infarct or other acute intracranial abnormality. 2. Unchanged appearance of old right corona radiata infarct and moderate chronic small vessel ischemic disease.   Electronically Signed   By: Sebastian Ache   On: 02/12/2013 10:11   Mr Brain Wo Contrast  02/06/2013   CLINICAL DATA:  79 year old female with left lower extremity weakness. Initial encounter.  EXAM: MRI HEAD WITHOUT CONTRAST  MRA HEAD WITHOUT CONTRAST  TECHNIQUE: Multiplanar, multiecho pulse sequences of the brain and surrounding structures were obtained without intravenous contrast. Angiographic images of the head were obtained using  MRA technique without contrast.  COMPARISON:  Head CT 02/05/2013. Brain MRI and MRA 08/01/2012.  FINDINGS: MRI HEAD FINDINGS  Expected evolution of the right corona radiata -lentiform nuclei infarct which occurred in May. Cystic encephalomalacia. No restricted diffusion or evidence of acute infarction.  Major intracranial vascular flow voids are stable. Stable cerebral volume. No ventriculomegaly. No midline shift, mass effect, or evidence of intracranial mass lesion. No acute intracranial hemorrhage identified. Negative pituitary. Negative cervicomedullary junction. Stable visualized cervical spine. Confluent cerebral white matter T2 and FLAIR hyperintensity re- identified. Moderate T2 heterogeneity in the deep gray matter nuclei, in part related to perivascular spaces. Moderate T2 hyperintensity in the pons. Chronic lacunar infarcts in  the right cerebellum.  Stable orbits soft tissues. Stable paranasal sinuses and mastoids. Visualized scalp soft tissues are within normal limits. Normal bone marrow signal.  MRA HEAD FINDINGS  Stable antegrade flow in the posterior circulation with codominant distal vertebral arteries. Normal left PICA. Dominant, duplicated right AICA. Patent basilar artery without stenosis. SCA and PCA origins are within normal limits. Posterior communicating artery is diminutive or absent. Mild to moderate bilateral PCA branch irregularity is stable with preserved distal flow.  Antegrade flow in both ICA siphons. Stable ICA ectasia. No ICA stenosis. Ophthalmic artery origins are normal. Stable and normal carotid termini, MCA and ACA origins. Normal anterior communicating artery.  Stable moderate to severe left ACA A2 stenosis with preserved distal flow. Suggestion of a new right ACA callosomarginal region high-grade stenosis (series 5, image 128) with preserved distal flow.  Mild bilateral MCA irregularity, relatively sparing the M1 segments, is stable. No major MCA branch occlusion identified.  IMPRESSION: 1. No acute intracranial abnormality. Expected evolution of right hemisphere lacunar infarct since May. Underlying advanced chronic small vessel disease.  2. Suggestion of new distal right ACA hemodynamically significant stenosis, at the callosomarginal artery level, with preserved distal flow.  3. Otherwise stable intracranial MRA since 08/01/2012, with overall moderate medium-sized vessel irregularity compatible with atherosclerosis.   Electronically Signed   By: Augusto Gamble M.D.   On: 02/06/2013 09:07   Mr Lumbar Spine Wo Contrast  02/14/2013   CLINICAL DATA:  Left hemiplegia not associated with stroke.  EXAM: MRI LUMBAR SPINE WITHOUT CONTRAST  TECHNIQUE: Multiplanar, multisequence MR imaging was performed. No intravenous contrast was administered.  COMPARISON:  None.  FINDINGS: No marrow signal abnormality suggestive of  fracture or neoplasm. Hyperintensity within the inferior endplate of L1 and around the T11-T12 anterior disc is likely degenerative. Normal conus signal and morphology. No left-sided nerve thickening. No extra-spinal findings to explain hemiplegia. There is a tiny (4 mm) T2 hyperintense lesion in the lower left kidney, likely a cyst.  Degenerative changes:  L1-L2: No nerve impingement.  L2-L3: Posterior annular fissure. Disc bulging combined with dorsal ligamentous and facet overgrowth narrows the lateral recesses. No foraminal stenosis.  L3-L4: Mild retrolisthesis. Degenerative facet and ligamentous overgrowth combined with disc bulging and central herniation cause moderate to advanced canal stenosis, with near complete effacement of CSF. The lateral recesses are particularly stenotic. The inferior foramina are effaced.  L4-L5: Facet osteoarthritis with bony and ligamentous overgrowth, causing moderate encroachment on the foramina. The lateral recesses are also narrowed, without nerve compression.  L5-S1:Degenerative disc narrowing.  No nerve compression.  IMPRESSION: 1. No unilateral abnormality to explain left hemiplegia. 2. Moderate to advanced spinal canal stenosis at L3-4, secondary to both disc and facet degeneration. 3. Mild degenerative lateral recess stenosis bilaterally at L2-3 and L4-5.   Electronically  Signed   By: Tiburcio Pea M.D.   On: 02/14/2013 04:04   Dg Foot 2 Views Left  02/06/2013   CLINICAL DATA:  Foot pain  EXAM: LEFT FOOT - 2 VIEW  COMPARISON:  None.  FINDINGS: Normal alignment. Negative for fracture. Calcification in the Achilles tendon. No significant arthropathy.  IMPRESSION: No acute abnormality.   Electronically Signed   By: Marlan Palau M.D.   On: 02/06/2013 15:18   Mr Maxine Glenn Head/brain Wo Cm  02/06/2013   CLINICAL DATA:  79 year old female with left lower extremity weakness. Initial encounter.  EXAM: MRI HEAD WITHOUT CONTRAST  MRA HEAD WITHOUT CONTRAST  TECHNIQUE:  Multiplanar, multiecho pulse sequences of the brain and surrounding structures were obtained without intravenous contrast. Angiographic images of the head were obtained using MRA technique without contrast.  COMPARISON:  Head CT 02/05/2013. Brain MRI and MRA 08/01/2012.  FINDINGS: MRI HEAD FINDINGS  Expected evolution of the right corona radiata -lentiform nuclei infarct which occurred in May. Cystic encephalomalacia. No restricted diffusion or evidence of acute infarction.  Major intracranial vascular flow voids are stable. Stable cerebral volume. No ventriculomegaly. No midline shift, mass effect, or evidence of intracranial mass lesion. No acute intracranial hemorrhage identified. Negative pituitary. Negative cervicomedullary junction. Stable visualized cervical spine. Confluent cerebral white matter T2 and FLAIR hyperintensity re- identified. Moderate T2 heterogeneity in the deep gray matter nuclei, in part related to perivascular spaces. Moderate T2 hyperintensity in the pons. Chronic lacunar infarcts in the right cerebellum.  Stable orbits soft tissues. Stable paranasal sinuses and mastoids. Visualized scalp soft tissues are within normal limits. Normal bone marrow signal.  MRA HEAD FINDINGS  Stable antegrade flow in the posterior circulation with codominant distal vertebral arteries. Normal left PICA. Dominant, duplicated right AICA. Patent basilar artery without stenosis. SCA and PCA origins are within normal limits. Posterior communicating artery is diminutive or absent. Mild to moderate bilateral PCA branch irregularity is stable with preserved distal flow.  Antegrade flow in both ICA siphons. Stable ICA ectasia. No ICA stenosis. Ophthalmic artery origins are normal. Stable and normal carotid termini, MCA and ACA origins. Normal anterior communicating artery.  Stable moderate to severe left ACA A2 stenosis with preserved distal flow. Suggestion of a new right ACA callosomarginal region high-grade stenosis  (series 5, image 128) with preserved distal flow.  Mild bilateral MCA irregularity, relatively sparing the M1 segments, is stable. No major MCA branch occlusion identified.  IMPRESSION: 1. No acute intracranial abnormality. Expected evolution of right hemisphere lacunar infarct since May. Underlying advanced chronic small vessel disease.  2. Suggestion of new distal right ACA hemodynamically significant stenosis, at the callosomarginal artery level, with preserved distal flow.  3. Otherwise stable intracranial MRA since 08/01/2012, with overall moderate medium-sized vessel irregularity compatible with atherosclerosis.   Electronically Signed   By: Augusto Gamble M.D.   On: 02/06/2013 09:07    Microbiology: Recent Results (from the past 240 hour(s))  URINE CULTURE     Status: None   Collection Time    02/05/13  9:26 PM      Result Value Range Status   Specimen Description URINE, CLEAN CATCH   Final   Special Requests CX ADDED AT 2151 ON [id number]   Final   Culture  Setup Time     Final   Value: 02/06/2013 01:12     Performed at [facility]   Culture     Final   Value: Multiple bacterial morphotypes present, none predominant. Suggest appropriate recollection  if clinically indicated.     Performed at [facility]   Report Status 02/06/2013 FINAL   Final     Labs: Basic Metabolic Panel:  Recent Labs Lab 02/12/13 0730 02/12/13 0733 02/13/13 0555  NA 139 142 140  K 3.8 3.8 4.2  CL 104 104 108  CO2 24  --  21  GLUCOSE 168* 171* 83  BUN 13 14 15   CREATININE 0.98 1.20* 0.83  CALCIUM 8.9  --  8.7   Liver Function Tests:  Recent Labs Lab 02/12/13 0730  AST 25  ALT 14  ALKPHOS 81  BILITOT 0.6  PROT 6.9  ALBUMIN 3.4*   No results found for this basename: LIPASE, AMYLASE,  in the last 168 hours No results found for this basename: AMMONIA,  in the last 168 hours CBC:  Recent Labs Lab 02/12/13 0730 02/12/13 0733 02/13/13 0905  WBC 5.2  --  5.7  NEUTROABS 3.1  --    --   HGB 13.3 15.3* 12.6  HCT 41.0 45.0 38.0  MCV 90.1  --  90.0  PLT 179  --  190   Cardiac Enzymes:  Recent Labs Lab 02/12/13 0730  TROPONINI <0.30   BNP: BNP (last 3 results) No results found for this basename: PROBNP,  in the last 8760 hours CBG:  Recent Labs Lab 02/13/13 0636 02/13/13 1129 02/13/13 1645 02/13/13 2201 02/14/13 0626  GLUCAP 85 91 110* 97 106*       Signed:  Aneli Zara  [facility] 02/15/2013, 9:57 AM

## 2013-02-15 NOTE — Progress Notes (Signed)
Stroke Team Progress Note  HISTORY [name] is an 79 y.o. female history of atrial fibrillation, hypertension, hyperlipidemia and previous stroke presenting with new onset left facial droop and left hemiparesis as well as numbness on the left side. Patient was last known well at 6:30 AM today 02/12/2013. She's been on Xarelto for anticoagulation. She was admitted here one week ago for leg weakness of acute onset. MRI showed no evidence of recurrent acute stroke. NIH stroke scale today was 9. Exam findings indicated probable subcortical right recurrent small vessel TIA or infarction. Patient was not a TPA candidate secondary to being on xarelto. She was also felt to not be a candidate for interventional radiology because deficits were indicative of likely subcortical small vessel involvement with no indications of proximal MCA or ICA occlusion. NIH stroke score was 9.  She was admitted for further evaluation and treatment.  SUBJECTIVE Family at bedside, speaking with rehab PA. Bed available today.  OBJECTIVE Most recent Vital Signs: Filed Vitals:   02/14/13 1419 02/14/13 1800 02/15/13 0200 02/15/13 0955  BP: 143/67 129/60 128/73 120/74  Pulse: 71 66 79 76  Temp: 97.7 F (36.5 C) 97.9 F (36.6 C) 98.2 F (36.8 C) 98 F (36.7 C)  TempSrc: Oral Oral Oral Oral  Resp: 18 18 18 20   Height:      Weight:      SpO2: 97% 93% 96% 96%   CBG (last 3)   Recent Labs  02/13/13 1645 02/13/13 2201 02/14/13 0626  GLUCAP 110* 97 106*    IV Fluid Intake:     MEDICATIONS  . amLODipine  2.5 mg Oral Daily  . atorvastatin  40 mg Oral q1800  . divalproex  250 mg Oral Daily  . multivitamin with minerals  1 tablet Oral Daily  . Rivaroxaban  15 mg Oral Q supper  . sodium chloride  3 mL Intravenous Q12H   PRN:  aspirin-acetaminophen-caffeine, sodium chloride  Diet:  Cardiac thin liquids Activity:   DVT Prophylaxis:  xarelto  CLINICALLY SIGNIFICANT STUDIES Basic Metabolic Panel:   Recent  Labs Lab 02/12/13 0730 02/12/13 0733 02/13/13 0555  NA 139 142 140  K 3.8 3.8 4.2  CL 104 104 108  CO2 24  --  21  GLUCOSE 168* 171* 83  BUN 13 14 15   CREATININE 0.98 1.20* 0.83  CALCIUM 8.9  --  8.7   Liver Function Tests:   Recent Labs Lab 02/12/13 0730  AST 25  ALT 14  ALKPHOS 81  BILITOT 0.6  PROT 6.9  ALBUMIN 3.4*   CBC:   Recent Labs Lab 02/12/13 0730 02/12/13 0733 02/13/13 0905  WBC 5.2  --  5.7  NEUTROABS 3.1  --   --   HGB 13.3 15.3* 12.6  HCT 41.0 45.0 38.0  MCV 90.1  --  90.0  PLT 179  --  190   Coagulation:   Recent Labs Lab 02/12/13 0730  LABPROT 18.2*  INR 1.55*   Cardiac Enzymes:   Recent Labs Lab 02/12/13 0730  TROPONINI <0.30   Urinalysis:   Recent Labs Lab 02/12/13 1046  COLORURINE YELLOW  LABSPEC 1.014  PHURINE 7.5  GLUCOSEU NEGATIVE  HGBUR NEGATIVE  BILIRUBINUR NEGATIVE  KETONESUR NEGATIVE  PROTEINUR NEGATIVE  UROBILINOGEN 0.2  NITRITE NEGATIVE  LEUKOCYTESUR NEGATIVE   Lipid Panel    Component Value Date/Time   CHOL 170 02/06/2013 0525   TRIG 90 02/06/2013 0525   HDL 58 02/06/2013 0525   CHOLHDL 2.9 02/06/2013 0525  VLDL 18 02/06/2013 0525   LDLCALC 94 02/06/2013 0525   HgbA1C  Lab Results  Component Value Date   HGBA1C 5.9* 02/12/2013    Urine Drug Screen:     Component Value Date/Time   LABOPIA NONE DETECTED 02/12/2013 1046   COCAINSCRNUR NONE DETECTED 02/12/2013 1046   LABBENZ NONE DETECTED 02/12/2013 1046   AMPHETMU NONE DETECTED 02/12/2013 1046   THCU NONE DETECTED 02/12/2013 1046   LABBARB NONE DETECTED 02/12/2013 1046    Alcohol Level:   Recent Labs Lab 02/12/13 0730  ETH <11   CT of the brain  02/12/2013   No acute finding.  Atrophy, chronic microvascular ischemic change and remote corona radiata infarct, unchanged.    MRI of the brain  02/12/2013    1. No evidence of acute infarct or other acute intracranial abnormality. 2. Unchanged appearance of old right corona radiata infarct  and moderate chronic small vessel ischemic disease.     MRI Lumbar Spine 02/14/2013 1. No unilateral abnormality to explain left hemiplegia. 2. Moderate to advanced spinal canal stenosis at L3-4, secondary to both disc and facet degeneration. 3. Mild degenerative lateral recess stenosis bilaterally at L2-3 and L4-5.  EKG  unchanged from previous tracings, atrial flutter, rate 80.   EEG  02/12/2013  This is a normal EEG recording during wakefulness. No evidence of an epileptic disorder was demonstrated.  Therapy Recommendations CIR   GENERAL EXAM: Patient is in no distress CARDIOVASCULAR: Regular rate and rhythm, no murmurs, no carotid bruits NEUROLOGIC: MENTAL STATUS: awake, alert, language fluent, comprehension intact, naming intact CRANIAL NERVE: pupils equal and reactive to light, visual fields full to confrontation, extraocular muscles intact, no nystagmus, facial sensation DECR ON LEFT. LEFT NL FOLD DECR. Uvula midline, shoulder shrug symmetric, tongue midline. MOTOR: RUE AND RLE 5. LUE 4. LLE (NO MOVEMENT ON VOLUNTARY EFFORT; 1-2 ON PLANTAR STIM).  SENSORY: DECR IN LEFT ARM AND LEG. COORDINATION: finger-nose-finger normal REFLEXES: BUE 2, RLE 1, LLE 0 GAIT/STATION: LAYING IN BED   ASSESSMENT Ms. HAYLYNN PHA is a 79 y.o. female presenting with recurrent weakness and numbness of left side, LOC and incontinence in setting of headache. Imaging confirms no acute infarct.   Her left leg weakness is out of proportion to her left face/arm findings. MRI negative for new stroke. Lumbar radiculopathy / peripheral neuropathy is not severe enough to cause the HP.  Due to facial involvement, do not feel neck is involved - sagittal MRI images  without stenosis at C1 and C2 s . Dx:  Likely a new small right brain stroke not seen on MRI that has led to left leg hemiplegia > left arm/face hemiparesis (which has improved).  EEG negative for seizure, though if sx recur, would recommend  antiepileptics.  Migraine headache up to 3x/week. Had a headache in ambulance on the way to the hospital  On xarelto prior to admission. Now on xarelto for secondary stroke prevention.  Work up completed.  Hx right lenticular nucleus infarct 08/02/2012, embolic secondary to afib Recent stroke admission last week, Feb 05, 2013 where MRI neg for acute stroke, classified as TIA, negative for UTI, no sz workup Added low dose depakote to help with headache as well as can address possible seizures - 250 mg daily  atrial fibrillation  Hypertension Hyperlipidemia, LDL 94, on lipitor 40 PTA, now on lipitor 40, goal LDL < 100 (< 70 for diabetics)   Hospital day # 3  TREATMENT/PLAN  Continue xarelto for secondary stroke prevention.  Should the same symptoms recur, would suspect seizure and recommend antiepileptics at that time  Discharge to CIR today.   Annie Main, MSN, RN, ANVP-BC, ANP-BC, [facility] Pager: [phone] 02/15/2013 10:28 AM  I have personally examined this patient, reviewed the chart and pertinent data, developed the plan of care and agree with above  Delia Heady, MD,

## 2013-02-16 ENCOUNTER — Inpatient Hospital Stay (HOSPITAL_COMMUNITY): Payer: Medicare Other | Admitting: *Deleted

## 2013-02-16 ENCOUNTER — Telehealth: Payer: Self-pay | Admitting: *Deleted

## 2013-02-16 ENCOUNTER — Inpatient Hospital Stay (HOSPITAL_COMMUNITY): Payer: Medicare Other | Admitting: Occupational Therapy

## 2013-02-16 ENCOUNTER — Ambulatory Visit: Payer: Medicare Other | Admitting: Family

## 2013-02-16 DIAGNOSIS — I639 Cerebral infarction, unspecified: Secondary | ICD-10-CM

## 2013-02-16 DIAGNOSIS — I6330 Cerebral infarction due to thrombosis of unspecified cerebral artery: Secondary | ICD-10-CM

## 2013-02-16 LAB — CBC WITH DIFFERENTIAL/PLATELET
Basophils Absolute: 0 10*3/uL (ref 0.0–0.1)
Eosinophils Absolute: 0.3 10*3/uL (ref 0.0–0.7)
Eosinophils Relative: 5 % (ref 0–5)
Lymphocytes Relative: 15 % (ref 12–46)
Lymphs Abs: 0.8 10*3/uL (ref 0.7–4.0)
MCV: 89.4 fL (ref 78.0–100.0)
Monocytes Relative: 15 % — ABNORMAL HIGH (ref 3–12)
Neutrophils Relative %: 65 % (ref 43–77)
Platelets: 190 10*3/uL (ref 150–400)
RBC: 4.63 MIL/uL (ref 3.87–5.11)
RDW: 13.6 % (ref 11.5–15.5)
WBC: 5.5 10*3/uL (ref 4.0–10.5)

## 2013-02-16 LAB — COMPREHENSIVE METABOLIC PANEL
ALT: 10 U/L (ref 0–35)
AST: 20 U/L (ref 0–37)
Alkaline Phosphatase: 77 U/L (ref 39–117)
CO2: 29 mEq/L (ref 19–32)
Calcium: 8.7 mg/dL (ref 8.4–10.5)
GFR calc Af Amer: 86 mL/min — ABNORMAL LOW (ref >90)
Glucose, Bld: 101 mg/dL — ABNORMAL HIGH (ref 70–99)
Potassium: 4 mEq/L (ref 3.5–5.1)
Sodium: 143 mEq/L (ref 135–145)
Total Bilirubin: 0.6 mg/dL (ref 0.3–1.2)
Total Protein: 6.1 g/dL (ref 6.0–8.3)

## 2013-02-16 MED ORDER — BOOST / RESOURCE BREEZE PO LIQD
1.0000 | ORAL | Status: DC
  Administered 2013-02-16 – 2013-02-27 (×9): 1 via ORAL

## 2013-02-16 NOTE — Progress Notes (Signed)
Social Work Assessment and Plan Social Work Assessment and Plan  Patient Details  Name: [name] MRN: [id number] Date of Birth: [birth year]  Today's Date: 02/16/2013  Problem List:  Patient Active Problem List   Diagnosis Date Noted  . Subcortical infarction 02/16/2013  . Possible Seizures 02/14/2013  . TIA (transient ischemic attack) 02/12/2013  . Stroke-like symptom 02/12/2013  . Hyperglycemia 02/12/2013  . High cholesterol   . CVA (cerebral vascular accident) 02/05/2013  . Left leg weakness 02/05/2013  . Atrial fibrillation 02/05/2013  . Chronic anticoagulation 02/05/2013  . HTN (hypertension) 11/14/2012  . right brain stroke too small to be seen on MRI 08/02/2012  . Syncope 06/01/2012  . Right ureteral stone 09/15/2010  . Shoulder pain 08/19/2010  . Atrial flutter 07/17/2010  . Orthostatic hypotension 07/17/2010  . OSTEOPENIA 06/03/2009  . MIGRAINE HEADACHE 02/05/2008  . CATARACTS, BILATERAL 02/05/2008  . DECREASED HEARING 02/05/2008  . GEN OSTEOARTHROSIS INVOLVING MULTIPLE SITES 02/05/2008  . COLONIC POLYPS, HX OF 02/05/2008   Past Medical History:  Past Medical History  Diagnosis Date  . Arthritis   . Hypertension   . Migraine   . Colon polyp   . Hearing difficulty   . Torus palatinus   . Atrial flutter January, 2012  . Stroke 08/02/12     right lenticular nucleus infarct  . Left leg weakness 02/05/2013  . Chronic anticoagulation   . High cholesterol    Past Surgical History:  Past Surgical History  Procedure Laterality Date  . Cataract extraction w/ intraocular lens  implant, bilateral  2006-2008  . Ganglion cyst excision Bilateral 1938,1954,2003,2005    "wrists/hand" (08/01/2012)  . Cardioversion  05/19/2010    Dr. Jacinto Halim  . Tonsillectomy  ~ 1935  . Appendectomy  02/19/53    `   Social History:  reports that she has quit smoking. She has never used smokeless tobacco. She reports that she drinks alcohol. She reports that she does not use illicit  drugs.  Family / Support Systems Marital Status: Widow/Widower Patient Roles: Parent Children: Kim Plummer-daughter  [phone]-cell  [name]-daughter  [phone]-home Other Supports: Kit-daughter  [phone]-home Anticipated Caregiver: All three daughter's and they will hire assist if necessary Ability/Limitations of Caregiver: All three daughter's work but window of 3 hours not covered, but will arrange if necessary Caregiver Availability: Other (Comment) (Working on 24 hr coverage) Family Dynamics: Close knit family all live together in eldest daughter's home.  All look out for one antoher and are very protective of their Mother.  Someone is usually here to make sure pt';s needs are provided for.  Very involved   Social History Preferred language: English Religion: Lutheran Cultural Background: No issues Education: Some College Read: Yes Write: Yes Employment Status: Retired Fish farm manager Issues: No issues Guardian/Conservator: According to MD pt is capable of making her own decisions but daughter also Pension scheme manager   Abuse/Neglect Physical Abuse: Denies Verbal Abuse: Denies Sexual Abuse: Denies Exploitation of patient/patient's resources: Denies Self-Neglect: Denies  Emotional Status Pt's affect, behavior adn adjustment status: Pt is a spunky lady who is willing to work and regain her independence.  She wants to be able to do for herself and not have others do for her.  Her daughter's are supportive and involved and will assist if necessary. Recent Psychosocial Issues: Other medical issues-previous strokes and had recovered from the first in May 2014 Pyschiatric History: No issues Substance Abuse History: No issues-drinks socially but very little  Patient / Family Perceptions, Expectations &  Goals Pt/Family understanding of illness & functional limitations: Pt and daughter's have a very good understanding of her condition and daughter's talk with MD daily.  Her  daughter [name] is an Charity fundraiser and her HCPOA.  Someone is always here with pt. Premorbid pt/family roles/activities: Mother, Retiree, Church member, etc Anticipated changes in roles/activities/participation: Plans to resume Pt/family expectations/goals: Pt states; " I want to do for myself and be self sufficent."  Daughter's state: " We will do what is necessary for Mom, we only have one."  [employer]: Other (Comment) (Had Private Duty in the past) Premorbid Home Care/DME Agencies: Other (Comment) (Has HH and was scheduled for OP came here) Transportation available at discharge: daughter's Resource referrals recommended: Support group (specify) ([facility] Support group)  Discharge Planning Living Arrangements: Children Support Systems: Children;Friends/neighbors Type of Residence: Private residence Insurance Resources: Administrator (specify) Building services engineer) Financial Resources: Tree surgeon;Family Support Financial Screen Referred: No Living Expenses: Lives with family Money Management: Family Does the patient have any problems obtaining your medications?: No Home Management: Daughter's do the home management Patient/Family Preliminary Plans: Return home with daughter's and hired assist if necessary.  Daughter's to try to move their schedules around for pt also. Social Work Anticipated Follow Up Needs: HH/OP;Support Group  Clinical Impression Very supportive and committed family, all have fun together and daughter's main concern is their mother.  They will provide 24 hour care if it is needed and plan to be very involved With pt while here.  Pt is spunky and ready to work in therapies.  Await team's evaluations and assist with discharge planning.  Lucy Chris 02/16/2013, 1:26 PM

## 2013-02-16 NOTE — Evaluation (Signed)
Physical Therapy Assessment and Plan  Patient Details  Name: [name] MRN: [id number] Date of Birth: 11/02/1924  PT Diagnosis: Abnormality of gait, Coordination disorder, Hemiparesis non-dominant, Impaired sensation and Muscle weakness Rehab Potential: Good ELOS: 2 weeks   Today's Date: 02/16/2013 Time: 0910-1005  And 14:20-15:05 ( )   Time Calculation (min): 55 min  Problem List:  Patient Active Problem List   Diagnosis Date Noted  . Subcortical infarction 02/16/2013  . Possible Seizures 02/14/2013  . TIA (transient ischemic attack) 02/12/2013  . Stroke-like symptom 02/12/2013  . Hyperglycemia 02/12/2013  . High cholesterol   . CVA (cerebral vascular accident) 02/05/2013  . Left leg weakness 02/05/2013  . Atrial fibrillation 02/05/2013  . Chronic anticoagulation 02/05/2013  . HTN (hypertension) 11/14/2012  . right brain stroke too small to be seen on MRI 08/02/2012  . Syncope 06/01/2012  . Right ureteral stone 09/15/2010  . Shoulder pain 08/19/2010  . Atrial flutter 07/17/2010  . Orthostatic hypotension 07/17/2010  . OSTEOPENIA 06/03/2009  . MIGRAINE HEADACHE 02/05/2008  . CATARACTS, BILATERAL 02/05/2008  . DECREASED HEARING 02/05/2008  . GEN OSTEOARTHROSIS INVOLVING MULTIPLE SITES 02/05/2008  . COLONIC POLYPS, HX OF 02/05/2008    Past Medical History:  Past Medical History  Diagnosis Date  . Arthritis   . Hypertension   . Migraine   . Colon polyp   . Hearing difficulty   . Torus palatinus   . Atrial flutter January, 2012  . Stroke 08/02/12     right lenticular nucleus infarct  . Left leg weakness 02/05/2013  . Chronic anticoagulation   . High cholesterol    Past Surgical History:  Past Surgical History  Procedure Laterality Date  . Cataract extraction w/ intraocular lens  implant, bilateral  2006-2008  . Ganglion cyst excision Bilateral 1938,1954,2003,2005    "wrists/hand" (08/01/2012)  . Cardioversion  05/19/2010    Dr. Jacinto Halim  . Tonsillectomy  ~  1935  . Appendectomy  02/19/53    `    Assessment & Plan Clinical Impression: [name] is a 79 y.o. 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 Admitted 02/12/2013 with new onset of left facial droop as well as left-sided weakness. Recent admit 02/05/2013 for recurrent left-sided weakness with MRI showing expected evolution of right corona radiata lentiform nuclei infarct which did occur in May no new acute abnormalities. Followup MRI 02/12/2013 again showing no evidence of acute infarct. Patient did not receive TPA. EEG negative for seizure. MRI lumbar spine showed no unilateral abnormality to explain left hemiplegia. Neurology followup patient remains on Xarelto for CVA prophylaxis workup indicated small subcortical infarct small vessel involvement not seen on MRI. Noted intermittent bouts of headache placed on low-dose Depakote.  Patient is on a regular consistency diet. Physical therapy evaluation completed 02/13/2013 with recommendations of physical medicine rehabilitation consult to consider inpatient rehabilitation services. Patient was felt to be appropriate for inpatient rehabilitation services and was admitted for comprehensive rehabilitation program.    Patient transferred to CIR on 02/15/2013 .   Patient currently requires mod with mobility secondary to decreased cardiorespiratoy endurance, impaired timing and sequencing, unbalanced muscle activation and decreased coordination and decreased sitting balance, decreased standing balance and hemiplegia.  Prior to hospitalization, patient was modified independent  with mobility and lived with Family in a House home.  Home access is 1Stairs to enter.  Patient will benefit from skilled PT intervention to maximize safe  functional mobility, minimize fall risk and decrease caregiver burden for planned discharge home with 24 hour  supervision.  Anticipate patient will benefit from follow up HH at discharge.  PT - End of Session Activity Tolerance: Tolerates < 10 min activity with changes in vital signs Endurance Deficit: Yes Endurance Deficit Description: Pt with orthostatic changes in upright position, limiting participation upon eval PT Assessment Rehab Potential: Good Barriers to Discharge: Other (comment) (2nd floor bedroom) Barriers to Discharge Comments: There is a several hour gap between daughters home from work, will work to find safe alternative for that time PT Patient demonstrates impairments in the following area(s): Balance;Endurance;Motor;Pain;Safety;Sensory PT Transfers Functional Problem(s): Bed Mobility;Bed to Chair;Car;Furniture PT Locomotion Functional Problem(s): Ambulation;Wheelchair Mobility;Stairs PT Plan PT Intensity: Minimum of 1-2 x/day ,45 to 90 minutes PT Frequency: 5 out of 7 days PT Duration Estimated Length of Stay: 2 weeks PT Treatment/Interventions: Ambulation/gait training;Balance/vestibular training;Community reintegration;Discharge planning;Disease management/prevention;DME/adaptive equipment instruction;Functional mobility training;Neuromuscular re-education;Pain management;Patient/family education;Psychosocial support;Splinting/orthotics;Therapeutic Activities;Stair training;Therapeutic Exercise;UE/LE Strength taining/ROM;UE/LE Coordination activities;Wheelchair propulsion/positioning PT Transfers Anticipated Outcome(s): Supervision PT Locomotion Anticipated Outcome(s): Supervision PT Recommendation Recommendations for Other Services: Neuropsych consult Follow Up Recommendations: Home health PT;24 hour supervision/assistance Patient destination: Home Equipment Recommended: To be determined;Wheelchair cushion (measurements);Wheelchair (measurements);Rolling walker with 5" wheels;Other (comment) (Pt has 4WW, hemi-walker, and transport chair) Equipment Details: TBD  Skilled  Therapeutic Intervention 1:2 Tx initiated upon eval, which was limited due to orthostatic changes.  Daughters present, and very supportive of pt, but encouraged to let her do as much as possible independently.  Pt educated on WC propulsion technique with bil UEs and RLE. Pt given cues to increase stroke length for efficiency.  WC<>mat transfer with Mod A overall and cues for set-up and continuing turn for safety.  Once pt up sitting on mat, BP dropped (90/46), and c/o "whooziness." Returned pt to room to lay down.  In sit>supine instruction, pt unable to lift LEs onto bed, noting some pelvic/trunk stiffness limiting.  Performed supine therex for circulation. BP increased to [facility] in supine.  Educated pt/family on stroke pathology and rehab POC.  Will reattempt gait and stairs this afternoon.   2:2 Pt fatigued from the day, tx still limited by pt feeling "whoozie." Measured seated and attempted standing BPs but pt unable to remain standing >30sec. Tx focused on NMR, WC mobility, and transfer training. All 3 daughters present and participating.  Pt propelled WC x40' with Min A, cues for straight path and hemi-technique.  Attempted standing, but pt unable to stand >5sec first attempt, and only 30 sec second attempt. BP WNL, but pt felt poorly standing.  Seated NMR LE activation for L marching, LAQ, ankle pumps, and hip ADD/ABD x10 with cues to focus on LLE.  Gait x8' with RW and Mod A for LLE management and encouragement to continue.  NMR on Nustep for increased coordination and timing x52min with UE and LE with cues for increased pace as able.  WC>Bed transfer with Mod A. Sit>supine with Mod A as well.  Pt left with all needs in reach and family.      PT Evaluation Precautions/Restrictions Precautions Precautions: Fall Restrictions Weight Bearing Restrictions: No General   Vital SignsTherapy Vitals Pulse Rate: 71 BP: 126/73 mmHg Oxygen Therapy SpO2: 99 % O2 Device: None (Room  air) Pain Pain Assessment Pain Assessment: No/denies pain Home Living/Prior Functioning Home Living Available Help at Discharge: Family;Available 24 hours/day;Available PRN/intermittently Type of Home: House Home Access: Stairs to enter [employer] of Steps: 1 Entrance Stairs-Rails:  None Home Layout: Two level;Bed/bath upstairs Alternate Level Stairs-Number of Steps: 15 Alternate Level Stairs-Rails: Right Additional Comments: pt not wanting SNF-daughters report they can figure out 24/7 assistance  Lives With: Family Prior Function Level of Independence: Requires assistive device for independence;Needs assistance with ADLs;Other (comment) (Assistance with stairs)  Able to Take Stairs?: Yes Driving: No 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 Was using 4WW after May until November ...then hemi-walker. daughters provided supervision for bathing and assisted with giong up and down stairs Vision/Perception  Vision - History Baseline Vision: Wears glasses all the time Visual History: Other (comment) Patient Visual Report: No change from baseline Vision - Assessment Eye Alignment: Within Functional Limits Vision Assessment: Vision tested Ocular Range of Motion: Within Functional Limits Tracking/Visual Pursuits: Decreased smoothness of vertical tracking Convergence: Within functional limits Visual Fields: No apparent deficits Diplopia Assessment: Present in far gaze (with watching TV, pt adjusts head to compensate) Perception Perception: Within Functional Limits Praxis Praxis: Intact  Cognition Overall Cognitive Status: Within Functional Limits for tasks assessed Arousal/Alertness: Awake/alert Orientation Level: Oriented X4 Sensation Sensation Light Touch: Impaired Detail Light Touch Impaired Details: Impaired LLE Stereognosis: Appears Intact Hot/Cold: Appears Intact Proprioception: Appears Intact Coordination Gross Motor Movements are Fluid and Coordinated:  No Fine Motor Movements are Fluid and Coordinated: No Coordination and Movement Description: LLE GMC and FMC decreased, likely due to weakness.  Heel Shin Test: Impaired timing, accuracy, and excursion Motor  Motor Motor: Hemiplegia Motor - Skilled Clinical Observations: Pt demonstrates stronger islated MMT than she is able to functionally elicit. LUE is stronger than LLE  Mobility Bed Mobility Supine to Sit: 2: Max assist Sit to Supine: 3: Mod assist Sit to Supine - Details (indicate cue type and reason): Pt's self-selected mothod is to turn sideways and bring LEs up, lurchign trunk back and sideways in bed. Pt needing assist for LLE lifting and adjusting once in bed. Pt needing trunk lifting and LE carrying to get OOB via sidelyeing.  Transfers Stand Pivot Transfers: 3: Mod assist Stand Pivot Transfer Details (indicate cue type and reason): Mod A needed for completing turn before sitting. Pt gets stuck mid-turn, unable to move or pivot on feet.  Locomotion  Ambulation Ambulation: No Ambulation/Gait Assistance Details: Pt unable to walk at eval due to low blood pressure, dropping evey time she sat up, causing her to want to put head down. Will attempt later today.  Stairs / Additional Locomotion Stairs: No Stairs Assistance Details (indicate cue type and reason): Unable to attempt due to orthostatic changes Wheelchair Mobility Wheelchair Mobility: Yes Wheelchair Assistance: 4: Systems analyst: Both upper extremities;Right lower extremity Wheelchair Parts Management: Needs assistance Distance: 80  Trunk/Postural Assessment  Cervical Assessment Cervical Assessment: Within Functional Limits Thoracic Assessment Thoracic Assessment: Within Functional Limits Lumbar Assessment Lumbar Assessment: Within Functional Limits Postural Control Postural Control: Within Functional Limits  Balance Balance Balance Assessed: Yes Static Sitting Balance Static Sitting - Balance  Support: Feet supported Static Sitting - Level of Assistance: 5: Stand by assistance Static Sitting - Comment/# of Minutes: Pt sitting on toilet, wanting to rest head on knees due to "whooziness." Dynamic Sitting Balance Dynamic Sitting - Balance Support: Feet supported;During functional activity Dynamic Sitting - Level of Assistance: 5: Stand by assistance Dynamic Sitting - Balance Activities: Lateral lean/weight shifting;Forward lean/weight shifting;Reaching across midline Static Standing Balance Static Standing - Balance Support: Bilateral upper extremity supported;During functional activity Static Standing - Level of Assistance: 4: Min assist Static Standing - Comment/# of Minutes:  x47min  Dynamic Standing Balance Dynamic Standing - Balance Support: Bilateral upper extremity supported;During functional activity Dynamic Standing - Level of Assistance: 3: Mod assist Dynamic Standing - Balance Activities: Forward lean/weight shifting;Lateral lean/weight shifting Extremity Assessment  RUE Assessment RUE Assessment: Within Functional Limits LUE Assessment LUE Assessment: Exceptions to [facility] LUE AROM (degrees) Left Shoulder Flexion: 140 Degrees LUE Strength LUE Overall Strength: Deficits (3+/5 throughout LUE) RLE Assessment RLE Assessment: Exceptions to [facility] RLE Strength RLE Overall Strength Comments: 3+/5 throughout, but good flexibility LLE Assessment LLE Assessment: Exceptions to [facility] LLE Strength LLE Overall Strength Comments: 3-/5 throughout, except HS 2/5  FIM:  FIM - Bed/Chair Transfer Bed/Chair Transfer Assistive Devices: Arm rests Bed/Chair Transfer: 3: Supine > Sit: Mod A (lifting assist/Pt. 50-74%/lift 2 legs;2: Bed > Chair or W/C: Max A (lift and lower assist) FIM - Locomotion: Wheelchair Distance: 80   Refer to Care Plan for Long Term Goals  Recommendations for other services: Neuropsych  Discharge Criteria: Patient will be discharged from PT if patient refuses  treatment 3 consecutive times without medical reason, if treatment goals not met, if there is a change in medical status, if patient makes no progress towards goals or if patient is discharged from hospital.  The above assessment, treatment plan, treatment alternatives and goals were discussed and mutually agreed upon: by patient and by family  Addilyne Backs, Chrisandra Netters, PT, DPT  02/16/2013, 11:45 AM

## 2013-02-16 NOTE — Telephone Encounter (Signed)
Pt's daughter, Kit returned Provider's phone call re: additional questions and asks that we call her back on her cell#.

## 2013-02-16 NOTE — Evaluation (Signed)
Occupational Therapy Assessment and Plan  Patient Details  Name: [name] MRN: [id number] Date of Birth: Mar 01, 1925  OT Diagnosis: hemiplegia affecting non-dominant side and muscle weakness (generalized) Rehab Potential: Rehab Potential: Excellent ELOS: 2 weeks   Today's Date: 02/16/2013 Time: 0910-1005 and 1030-1100 Time Calculation (min): 55 min and 30 min  Problem List:  Patient Active Problem List   Diagnosis Date Noted  . Subcortical infarction 02/16/2013  . Possible Seizures 02/14/2013  . TIA (transient ischemic attack) 02/12/2013  . Stroke-like symptom 02/12/2013  . Hyperglycemia 02/12/2013  . High cholesterol   . CVA (cerebral vascular accident) 02/05/2013  . Left leg weakness 02/05/2013  . Atrial fibrillation 02/05/2013  . Chronic anticoagulation 02/05/2013  . HTN (hypertension) 11/14/2012  . right brain stroke too small to be seen on MRI 08/02/2012  . Syncope 06/01/2012  . Right ureteral stone 09/15/2010  . Shoulder pain 08/19/2010  . Atrial flutter 07/17/2010  . Orthostatic hypotension 07/17/2010  . OSTEOPENIA 06/03/2009  . MIGRAINE HEADACHE 02/05/2008  . CATARACTS, BILATERAL 02/05/2008  . DECREASED HEARING 02/05/2008  . GEN OSTEOARTHROSIS INVOLVING MULTIPLE SITES 02/05/2008  . COLONIC POLYPS, HX OF 02/05/2008    Past Medical History:  Past Medical History  Diagnosis Date  . Arthritis   . Hypertension   . Migraine   . Colon polyp   . Hearing difficulty   . Torus palatinus   . Atrial flutter January, 2012  . Stroke 08/02/12     right lenticular nucleus infarct  . Left leg weakness 02/05/2013  . Chronic anticoagulation   . High cholesterol    Past Surgical History:  Past Surgical History  Procedure Laterality Date  . Cataract extraction w/ intraocular lens  implant, bilateral  2006-2008  . Ganglion cyst excision Bilateral 1938,1954,2003,2005    "wrists/hand" (08/01/2012)  . Cardioversion  05/19/2010    Dr. Jacinto Halim  . Tonsillectomy  ~ 1935  .  Appendectomy  02/19/53    `    Assessment & Plan Clinical Impression:  [name] is a 79 y.o. 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 Admitted 02/12/2013 with new onset of left facial droop as well as left-sided weakness. Recent admit 02/05/2013 for recurrent left-sided weakness with MRI showing expected evolution of right corona radiata lentiform nuclei infarct which did occur in May no new acute abnormalities. Followup MRI 02/12/2013 again showing no evidence of acute infarct. Patient did not receive TPA. EEG negative for seizure. MRI lumbar spine showed no unilateral abnormality to explain left hemiplegia. Neurology followup patient remains on Xarelto for CVA prophylaxis workup indicated small subcortical infarct small vessel involvement not seen on MRI. Patient transferred to CIR on 02/15/2013 .    Patient currently requires total LB dressing, max with toileting, and set up with UB self care secondary to muscle weakness, decreased cardiorespiratoy endurance, unbalanced muscle activation and decreased coordination and decreased standing balance and hemiplegia.  Prior to hospitalization, patient could complete basic ADLs with supervision.  Patient will benefit from skilled intervention to increase independence with basic self-care skills prior to discharge home with care partner.  Anticipate patient will require intermittent supervision and follow up home health.  OT - End of Session Activity Tolerance: Tolerates 30+ min activity with multiple rests Endurance Deficit: Yes OT Assessment Rehab Potential: Excellent OT Patient demonstrates impairments in the following area(s): Balance;Motor;Safety;Endurance OT Basic ADL's Functional Problem(s): Bathing;Dressing;Toileting OT Transfers Functional Problem(s): Toilet;Tub/Shower  OT Additional Impairment(s): Fuctional Use of Upper  Extremity OT Plan OT Intensity: Minimum of 1-2 x/day, 45 to 90 minutes OT Frequency: 5 out of 7 days OT Duration/Estimated Length of Stay: 2 weeks OT Treatment/Interventions: Balance/vestibular training;Discharge planning;DME/adaptive equipment instruction;Functional mobility training;Neuromuscular re-education;Patient/family education;Self Care/advanced ADL retraining;Therapeutic Activities;Therapeutic Exercise;UE/LE Strength taining/ROM;UE/LE Coordination activities OT Self Feeding Anticipated Outcome(s): I OT Basic Self-Care Anticipated Outcome(s): supervision with LB self care; I with UB self care OT Toileting Anticipated Outcome(s): supervision OT Bathroom Transfers Anticipated Outcome(s): supervision OT Recommendation Patient destination: Home Follow Up Recommendations: Home health OT Equipment Recommended: 3 in 1 bedside comode   Skilled Therapeutic Intervention Visit 1: No c/o pain. Pt seen for initial evaluation and ADL retraining with toileting, bathing at sink, and dressing with a focus on use of LUE, sit to stand, and standing balance. Pt participated extremely well using her L hand actively the entire session without cues to wash ub, fasten bra, open containers, etc.  She is very fearful of falling and this inhibits her movement greatly as she needs max assist to move hips left and right in a squat pivot transfer. At the sink, she is able to come into a stand with only min assist and mod assist to stabilize balance.  She was able to partially pull pants over feet and over hips.  Pt needed to toilet for the 2nd time during the session, pt transferred to toilet and then physical therapist arrived for her next session.  Visit 2: No c/o pain.  Pt's daughter present for 2nd session and education on OT POC.  Pt was on toilet for 3rd time this am and needed max assist to transfer to w/c.  Pt and daughters were instructed with return demonstration of hand exercises with theraputty and  shoulder exercises with level 1 theraband.  Pt and family encouraged to work on these exercises over the weekend. Pt requested to get in bed and completed squat pivot to the left with max assist and sit to supine with mod. Pt resting in bed with call light in reach.  OT Evaluation Precautions/Restrictions  Precautions Precautions: Fall Restrictions Weight Bearing Restrictions: No Therapy Vitals Pulse Rate: 71 BP: 126/73 mmHg Oxygen Therapy SpO2: 99 % O2 Device: None (Room air) Pain Pain Assessment Pain Assessment: No/denies pain Home Living/Prior Functioning Home Living Available Help at Discharge: Family;Available 24 hours/day;Available PRN/intermittently Type of Home: House Home Access: Stairs to enter [employer] of Steps: 1 Entrance Stairs-Rails: None Home Layout: Two level;Bed/bath upstairs Alternate Level Stairs-Number of Steps: 15 Alternate Level Stairs-Rails: Right Additional Comments: pt not wanting SNF-daughters report they can figure out 24/7 assistance  Lives With: Family Prior Function Level of Independence: Requires assistive device for independence;Needs assistance with ADLs;Other (comment) (Assistance with stairs)  Able to Take Stairs?: Yes Driving: No 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 Was using 4WW after May until November ...then hemi-walker. daughters provided supervision for bathing and assisted with giong up and down stairs ADL  refer to FIM Vision/Perception  Vision - History Baseline Vision: Wears glasses all the time Visual History: Other (comment) Patient Visual Report: No change from baseline Vision - Assessment Eye Alignment: Within Functional Limits Vision Assessment: Vision tested Ocular Range of Motion: Within Functional Limits Tracking/Visual Pursuits: Decreased smoothness of vertical tracking Convergence: Within functional limits Visual Fields: No apparent deficits Diplopia Assessment: Present in far gaze (with  watching TV, pt adjusts head to compensate) Perception Perception: Within Functional Limits Praxis Praxis: Intact  Cognition Overall Cognitive Status:  Within Functional Limits for tasks assessed Arousal/Alertness: Awake/alert Orientation Level: Oriented X4 Sensation Sensation Light Touch:  (intact in LUE) Stereognosis: Appears Intact Hot/Cold: Appears Intact Proprioception:  (intact in LUE) Coordination Gross Motor Movements are Fluid and Coordinated: No Fine Motor Movements are Fluid and Coordinated: No Coordination and Movement Description: FMC is slow, but pt is able to fasten bra and open small containers Motor  Motor Motor: Hemiplegia Motor - Skilled Clinical Observations: LUE is stronger than LLE Mobility    refer to FIM Trunk/Postural Assessment  Cervical Assessment Cervical Assessment: Within Functional Limits Thoracic Assessment Thoracic Assessment: Within Functional Limits Lumbar Assessment Lumbar Assessment: Within Functional Limits Postural Control Postural Control: Within Functional Limits  Balance Static Sitting Balance Static Sitting - Balance Support: Feet supported Static Sitting - Level of Assistance: 6: Modified independent (Device/Increase time) Dynamic Sitting Balance Dynamic Sitting - Level of Assistance: 5: Stand by assistance Dynamic Standing Balance Dynamic Standing - Level of Assistance: 3: Mod assist Extremity/Trunk Assessment RUE Assessment RUE Assessment: Within Functional Limits LUE Assessment LUE Assessment: Exceptions to [facility] LUE AROM (degrees) Left Shoulder Flexion: 140 Degrees LUE Strength LUE Overall Strength: Deficits (3+/5 throughout LUE)  FIM:  FIM - Grooming Grooming Steps: Wash, rinse, dry face;Wash, rinse, dry hands;Oral care, brush teeth, clean dentures;Brush, comb hair Grooming: 5: Set-up assist to obtain items FIM - Bathing Bathing Steps Patient Completed: Chest;Right Arm;Left Arm;Abdomen;Front perineal  area;Buttocks;Right upper leg;Left upper leg Bathing: 4: Min-Patient completes 8-9 52f 10 parts or 75+ percent FIM - Upper Body Dressing/Undressing Upper body dressing/undressing steps patient completed: Thread/unthread right bra strap;Thread/unthread left bra strap;Hook/unhook bra;Thread/unthread right sleeve of pullover shirt/dresss;Thread/unthread left sleeve of pullover shirt/dress;Pull shirt over trunk Upper body dressing/undressing: 4: Min-Patient completed 75 plus % of tasks FIM - Lower Body Dressing/Undressing Lower body dressing/undressing: 1: Total-Patient completed less than 25% of tasks FIM - Toileting Toileting steps completed by patient: Performs perineal hygiene Toileting: 2: Max-Patient completed 1 of 3 steps FIM - Bed/Chair Transfer Bed/Chair Transfer Assistive Devices: Arm rests Bed/Chair Transfer: 3: Supine > Sit: Mod A (lifting assist/Pt. 50-74%/lift 2 legs;2: Bed > Chair or W/C: Max A (lift and lower assist) FIM - Archivist Transfers Assistive Devices: Bedside commode;Grab bars (BSC over toilet) Toilet Transfers: 2-To toilet/BSC: Max A (lift and lower assist);2-From toilet/BSC: Max A (lift and lower assist) FIM - Tub/Shower Transfers Tub/shower Transfers: 0-Activity did not occur or was simulated   Refer to Care Plan for Long Term Goals  Recommendations for other services: None  Discharge Criteria: Patient will be discharged from OT if patient refuses treatment 3 consecutive times without medical reason, if treatment goals not met, if there is a change in medical status, if patient makes no progress towards goals or if patient is discharged from hospital.  The above assessment, treatment plan, treatment alternatives and goals were discussed and mutually agreed upon: by patient  [facility] 02/16/2013, 11:39 AM

## 2013-02-16 NOTE — Care Management Note (Signed)
[facility] Individual Statement of Services  Patient Name:  [name]  Date:  02/16/2013  Welcome to the [facility].  Our goal is to provide you with an individualized program based on your diagnosis and situation, designed to meet your specific needs.  With this comprehensive rehabilitation program, you will be expected to participate in at least 3 hours of rehabilitation therapies Monday-Friday, with modified therapy programming on the weekends.  Your rehabilitation program will include the following services:  Physical Therapy (PT), Occupational Therapy (OT), Speech Therapy (ST), 24 hour per day rehabilitation nursing, Case Management (Social Worker), Rehabilitation Medicine, Nutrition Services and Pharmacy Services  Weekly team conferences will be held on Wednesday to discuss your progress.  Your Social Worker will talk with you frequently to get your input and to update you on team discussions.  Team conferences with you and your family in attendance may also be held.  Expected length of stay: 2 weeks Overall anticipated outcome: supervision with set-up  Depending on your progress and recovery, your program may change. Your Social Worker will coordinate services and will keep you informed of any changes. Your Social Worker's name and contact numbers are listed  below.  The following services may also be recommended but are not provided by the Inpatient Rehabilitation [facility]:    Home Health Rehabiltiation Services  Outpatient Rehabilitation Services    Arrangements will be made to provide these services after discharge if needed.  Arrangements include referral to agencies that provide these services.  Your insurance has been verified to be:  Medicare & AARP Your primary doctor is:  Dr Sandford Craze  Pertinent information will be shared with your doctor and your insurance company.  Social Worker:  Dossie Der, SW [phone] or  ([phone]  Information discussed with and copy given to patient by: Lucy Chris, 02/16/2013, 1:12 PM

## 2013-02-16 NOTE — Progress Notes (Signed)
INITIAL NUTRITION ASSESSMENT  DOCUMENTATION CODES Per approved criteria  -Severe malnutrition in the context of chronic illness   INTERVENTION: Liberalize diet to Regular to maximize PO choices - discussed with PA, Dan. Add Resource Breeze po daily, each supplement provides 250 kcal and 9 grams of protein - if patient doesn't like these, may change to Ensure Complete po daily, each supplement provides 350 kcal and 13 grams of protein. RD to continue to follow nutrition care plan.  NUTRITION DIAGNOSIS: Increased nutrient needs related to participation in therapies as evidenced by estimated needs.   Goal: Intake to meet >90% of estimated nutrition needs.  Monitor:  weight trends, lab trends, I/O's, PO intake, supplement tolerance  Reason for Assessment: Malnutrition Screening Tool  79 y.o. female  Admitting Dx: Subcortical infarction  ASSESSMENT: PMHx significant for afib, HTN, CVA in May 2014. Admitted 11/17 with suspect small subcortical CVA not identified on MRI.  RD obtained nutrition hx from patient and daughters at bedside. Per discussion, pt is a very picky eater at baseline. Doesn't like gravy or chicken. Family has been having issues with patient's meal trays - missing items, late meals, etc. Discussed diet liberalization to Regular diet to maximize PO choices with PA - will change diet order at this time. Discussed option of adding oral nutrition supplements. Pt willing to try [employer].   Family states that pt has lost 6 lb since in the past 2 weeks, this is 5% of usual body weight and is significant for this time frame. Pt with obvious severe muscle mass loss in temples and clavicles. Pt meets criteria for severe MALNUTRITION in the context of acute illness as evidenced by severe muscle mass loss and 5% wt loss x <1 month.   Height: Ht Readings from Last 1 Encounters:  02/15/13 5\' 7"  (1.702 m)    Weight: Wt Readings from Last 1 Encounters:  02/15/13 126 lb 12.2  oz (57.5 kg)    Ideal Body Weight: 135 lb  % Ideal Body Weight: 93%  Wt Readings from Last 10 Encounters:  02/15/13 126 lb 12.2 oz (57.5 kg)  02/12/13 121 lb (54.885 kg)  02/05/13 121 lb (54.885 kg)  01/16/13 123 lb (55.792 kg)  11/14/12 121 lb 1.3 oz (54.922 kg)  10/12/12 122 lb (55.339 kg)  08/18/12 123 lb (55.792 kg)  08/01/12 122 lb 5.7 oz (55.5 kg)  06/01/12 120 lb 13 oz (54.8 kg)  01/14/12 124 lb 6.4 oz (56.427 kg)    Usual Body Weight: 130 lb (1 year ago)  % Usual Body Weight: 97%  BMI:  Body mass index is 19.85 kg/(m^2). Normal Weight  Estimated Nutritional Needs: Kcal: 1600 - 1800 Protein: 65 - 75 g Fluid: 1.6 - 1.8 liters  Skin: intact  Diet Order: Cardiac  EDUCATION NEEDS: -No education needs identified at this time   Intake/Output Summary (Last 24 hours) at 02/16/13 1200 Last data filed at 02/16/13 0900  Gross per 24 hour  Intake    360 ml  Output      0 ml  Net    360 ml    Last BM: 11/19   Labs:   Recent Labs Lab 02/12/13 0730 02/12/13 0733 02/13/13 0555 02/16/13 0603  NA 139 142 140 143  K 3.8 3.8 4.2 4.0  CL 104 104 108 108  CO2 24  --  21 29  BUN 13 14 15 16   CREATININE 0.98 1.20* 0.83 0.77  CALCIUM 8.9  --  8.7 8.7  GLUCOSE  168* 171* 83 101*    CBG (last 3)   Recent Labs  02/13/13 1645 02/13/13 2201 02/14/13 0626  GLUCAP 110* 97 106*    Scheduled Meds: . amLODipine  2.5 mg Oral Daily  . atorvastatin  40 mg Oral q1800  . divalproex  250 mg Oral Daily  . multivitamin with minerals  1 tablet Oral Daily  . Rivaroxaban  15 mg Oral Q supper    Continuous Infusions:   Past Medical History  Diagnosis Date  . Arthritis   . Hypertension   . Migraine   . Colon polyp   . Hearing difficulty   . Torus palatinus   . Atrial flutter January, 2012  . Stroke 08/02/12     right lenticular nucleus infarct  . Left leg weakness 02/05/2013  . Chronic anticoagulation   . High cholesterol     Past Surgical History   Procedure Laterality Date  . Cataract extraction w/ intraocular lens  implant, bilateral  2006-2008  . Ganglion cyst excision Bilateral 1938,1954,2003,2005    "wrists/hand" (08/01/2012)  . Cardioversion  05/19/2010    Dr. Jacinto Halim  . Tonsillectomy  ~ 1935  . Appendectomy  02/19/53    `    Jarold Motto MS, RD, LDN Pager: [phone] After-hours pager: [phone]

## 2013-02-16 NOTE — Progress Notes (Signed)
[name] is a 79 y.o. 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 Admitted 02/12/2013 with new onset of left facial droop as well as left-sided weakness. Recent admit 02/05/2013 for recurrent left-sided weakness with MRI showing expected evolution of right corona radiata lentiform nuclei infarct which did occur in May no new acute abnormalities. Followup MRI 02/12/2013 again showing no evidence of acute infarct. Patient did not receive TPA. EEG negative for seizure. MRI lumbar spine showed no unilateral abnormality to explain left hemiplegia. Neurology followup patient remains on Xarelto for CVA prophylaxis workup indicated small subcortical infarct small vessel involvement not seen on MRI. Noted intermittent bouts of headache placed on low-dose Depakote Subjective/Complaints:   Objective: Vital Signs: Blood pressure 143/79, pulse 72, temperature 98.2 F (36.8 C), temperature source Oral, resp. rate 17, height 5\' 7"  (1.702 m), weight 57.5 kg (126 lb 12.2 oz), SpO2 95.00%. No results found. Results for orders placed during the hospital encounter of 02/12/13 (from the past 72 hour(s))  GLUCOSE, CAPILLARY     Status: None   Collection Time    02/13/13  6:36 AM      Result Value Range   Glucose-Capillary 85  70 - 99 mg/dL  CBC     Status: None   Collection Time    02/13/13  9:05 AM      Result Value Range   WBC 5.7  4.0 - 10.5 K/uL   RBC 4.22  3.87 - 5.11 MIL/uL   Hemoglobin 12.6  12.0 - 15.0 g/dL   Comment: REPEATED TO VERIFY   HCT 38.0  36.0 - 46.0 %   MCV 90.0  78.0 - 100.0 fL   MCH 29.9  26.0 - 34.0 pg   MCHC 33.2  30.0 - 36.0 g/dL   RDW 16.1  09.6 - 04.5 %   Platelets 190  150 - 400 K/uL  GLUCOSE, CAPILLARY     Status: None   Collection Time    02/13/13 11:29 AM      Result Value Range   Glucose-Capillary 91  70 - 99 mg/dL  GLUCOSE, CAPILLARY     Status:  Abnormal   Collection Time    02/13/13  4:45 PM      Result Value Range   Glucose-Capillary 110 (*) 70 - 99 mg/dL  GLUCOSE, CAPILLARY     Status: None   Collection Time    02/13/13 10:01 PM      Result Value Range   Glucose-Capillary 97  70 - 99 mg/dL   Comment 1 Documented in Chart     Comment 2 Notify RN    GLUCOSE, CAPILLARY     Status: Abnormal   Collection Time    02/14/13  6:26 AM      Result Value Range   Glucose-Capillary 106 (*) 70 - 99 mg/dL   Comment 1 Documented in Chart     Comment 2 Notify RN       HEENT: normal Cardio: irregular Resp: CTA B/L and unlabored GI: BS positive and NT,ND Extremity:  Pulses positive and No Edema Skin:   Intact Neuro: Alert/Oriented, Cranial Nerve II-XII normal, Abnormal Sensory reduced LT on L side, Abnormal Motor 5/5 on R and 4-/5 LUE, 2-/5 Left HF , KE, ankle DF/PF, Abnormal FMC Ataxic/ dec FMC and Tone:  Within Normal Limits Musc/Skel:  Normal GEN NAD   Assessment/Plan: 1. Functional deficits secondary to Right  subcortical infarct with LLE>>LUE weakness which require 3+ hours per day of interdisciplinary therapy in a comprehensive inpatient rehab setting. Physiatrist is providing close team supervision and 24 hour management of active medical problems listed below. Physiatrist and rehab team continue to assess barriers to discharge/monitor patient progress toward functional and medical goals. FIM:                                  Medical Problem List and Plan:  1. Suspect small subcortical CVA not identified on MRI. Recent right lenticular nucleus infarct May 2014 with little residual  2. DVT Prophylaxis/Anticoagulation: Xarelto. Monitor for any bleeding episodes  3. Pain Management/headaches. Continue low-dose Depakote.  4. Neuropsych: This patient is capable of making decisions on her own behalf.  5. Hypertension. Norvasc 2.5 mg daily. Monitor with increased mobility  6. Hyperlipidemia. Lipitor  7. Rate  Controlled A Fib  LOS (Days) 1 A FACE TO FACE EVALUATION WAS PERFORMED  Shanah Guimaraes E 02/16/2013, 6:31 AM

## 2013-02-16 NOTE — Progress Notes (Signed)
Patient information reviewed and entered into eRehab system by Keasia Dubose, RN, CRRN, PPS Coordinator.  Information including medical coding and functional independence measure will be reviewed and updated through discharge.     Per nursing patient was given "Data Collection Information Summary for Patients in Inpatient Rehabilitation Facilities with attached "Privacy Act Statement-Health Care Records" upon admission.  

## 2013-02-17 ENCOUNTER — Inpatient Hospital Stay (HOSPITAL_COMMUNITY): Payer: Medicare Other | Admitting: *Deleted

## 2013-02-17 DIAGNOSIS — I6330 Cerebral infarction due to thrombosis of unspecified cerebral artery: Secondary | ICD-10-CM

## 2013-02-17 NOTE — Progress Notes (Signed)
[name] is a 79 y.o. 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 Admitted 02/12/2013 with new onset of left facial droop as well as left-sided weakness. Recent admit 02/05/2013 for recurrent left-sided weakness with MRI showing expected evolution of right corona radiata lentiform nuclei infarct which did occur in May no new acute abnormalities. Followup MRI 02/12/2013 again showing no evidence of acute infarct. Patient did not receive TPA. EEG negative for seizure. MRI lumbar spine showed no unilateral abnormality to explain left hemiplegia. Neurology followup patient remains on Xarelto for CVA prophylaxis workup indicated small subcortical infarct small vessel involvement not seen on MRI. Noted intermittent bouts of headache placed on low-dose Depakote  Subjective/Complaints:  Patient denies any pain. She is awake and brushing her teeth preparing her breakfast. No specific complaints. Objective: Vital Signs: Blood pressure 137/81, pulse 67, temperature 97.5 F (36.4 C), temperature source Oral, resp. rate 18, height 5\' 7"  (1.702 m), weight 126 lb 12.2 oz (57.5 kg), SpO2 97.00%. Elderly female in no acute distress. Chest clear to auscultation. Cardiac exam S1-S2 are regular. Abdominal exam active bowel sounds, soft. Extremities no edema.   Assessment/Plan: 1. Functional deficits secondary to Right subcortical infarct with LLE>>LUE weakness   Medical Problem List and Plan:  1. Suspect small subcortical CVA not identified on MRI. Recent right lenticular nucleus infarct May 2014 with little residual  2. DVT Prophylaxis/Anticoagulation: Xarelto. Monitor for any bleeding episodes  3. Pain Management/headaches. Continue low-dose Depakote.  4. Neuropsych: This patient is capable of making decisions on her own behalf.  5. Hypertension. Norvasc 2.5 mg daily. Monitor with increased  mobility . Note one blood pressure recorded 90/46. 6. Hyperlipidemia. Lipitor  7. Rate Controlled A Fib/- reviewed EKG- latest with rate controlled a . flutter  LOS (Days) 2 A FACE TO FACE EVALUATION WAS PERFORMED  SWORDS,BRUCE HENRY 02/17/2013, 7:01 AM

## 2013-02-17 NOTE — Progress Notes (Signed)
Physical Therapy Session Note  Patient Details  Name: [name] MRN: [id number] Date of Birth: 1924-07-17  Today's Date: 02/17/2013 Time: 9562-1308, 1300-1330  Skilled Therapeutic Interventions/Progress Updates:  Session I 60 min Patient resting in bed at the beginning of the session, no c/o opf pain, min A to perform supine to sit, sitting EOB with min A.Training in sit to stand with min A ,gait to and from bathroom with min A and cues for L LE progression.  Patient able to stand by the sink to wash her hands but ins not able to maintain standing to dry hand and brush hair.W/C propulsion to the gym with min A and cues to use B UE. Patient was interested in a rehab apartment,thereefore she went in to visit and was able to maneuver around the carpet surface also with min A and cues for turning. Nu Step 5 min on level 1 Borg 10 , 5 min on level 14 Borg 13. NMR to L LE to increase motor control and muscular strength. Stair training,patient able to go up 2 steps with modA  And cues for use of B rails and feet progression. Patient started showing signs of fatigue when on top of staircase, rested in standing but was not able to maneuver down, manual assistance of two to lower patient to the wheelchair. Patient complained of being weak. Vital signs taken BP 116/76 HR 83 SaO2 96%. Patient returned to bed , alarm on all needs within reach , nursing notified. Session II 30 min Patient in bed with her daughters in the room. Gait training with  2 wheel walker to/from gym with modA and manual cues for posture and weight shift. Patient needs constant cues during the entire gait activity. Kinetron training  : 3 rounds of 1 min in sitting to increase strength and weight distribution. Attempted standing and performing activity but has not been able to complete due to fear of falling and inability to correct her posture. Patient returned to room, positioned in bed with daugthers present. No complains of pain during  this session.      Therapy Documentation Precautions:  Precautions Precautions: Fall Restrictions Weight Bearing Restrictions: No   See FIM for current functional status  Therapy/Group: Individual Therapy  Dorna Mai 02/17/2013, 2:00 PM

## 2013-02-17 NOTE — Progress Notes (Signed)
Occupational Therapy Note  Patient Details  Name: ISIDRA MINGS MRN: [id number] Date of Birth: 1924/06/25 Today's Date: 02/17/2013 Time:1000-1100  (60 min) Pain: none Individual session;  1st session:  F ocus of treatment was bed mobility, transfers,  Neuro-muscular reeducation, sitting balance, standing balance, , therapeutic activities, sustained attention  Pt.'s daughters were present during session.  Transferred from bed to wc with min sssit.  Went to shower seat with mod assist and using grab bars.  Pt. Stood with minimal assist and was able to wash bottom.  Dressed out in [employer].  Pt.  Able to don pants with instructional cues and mod assist.  Left pt in bed with call bell,phone within reach.        2nd session: Time:1445-1530  (45 min) Pain:3/10 deltoid pain in LUE Individual session   Pt lying in bed.  Addressed supine to sit (mod assist with verbal cues for technique), functional mobility to bathroom, with mod assist with toileting, ambulated to wc.  Did sit to stand, scooting, and LuE there ex with pt having some pain in dletoid region with shol abduction 80 degrees, and neurtral external rotation.  Transferred back to bed.  Daugher, Km with her in room.      Humberto Seals 02/17/2013, 11:04 AM

## 2013-02-18 NOTE — Progress Notes (Signed)
C/o pain down backbone cushioned with allevyn x 2 and educated about turning  And eating more.

## 2013-02-18 NOTE — Progress Notes (Signed)
[name] is a 79 y.o. 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 Admitted 02/12/2013 with new onset of left facial droop as well as left-sided weakness. Recent admit 02/05/2013 for recurrent left-sided weakness with MRI showing expected evolution of right corona radiata lentiform nuclei infarct which did occur in May no new acute abnormalities. Followup MRI 02/12/2013 again showing no evidence of acute infarct. Patient did not receive TPA. EEG negative for seizure. MRI lumbar spine showed no unilateral abnormality to explain left hemiplegia. Neurology followup patient remains on Xarelto for CVA prophylaxis workup indicated small subcortical infarct small vessel involvement not seen on MRI. Noted intermittent bouts of headache placed on low-dose Depakote  Subjective/Complaints: She feels well. No complaints- getting ready to go to therapy She is HOH  Objective: Vital Signs: Blood pressure 144/79, pulse 68, temperature 98 F (36.7 C), temperature source Oral, resp. rate 16, height 5\' 7"  (1.702 m), weight 126 lb 12.2 oz (57.5 kg), SpO2 95.00%. Elderly female in no acute distress. Chest clear to auscultation. Cardiac exam S1-S2 are regular. Abdominal exam active bowel sounds, soft. Extremities no edema.   Assessment/Plan: 1. Functional deficits secondary to Right subcortical infarct with LLE>>LUE weakness   Medical Problem List and Plan:  1. Suspect small subcortical CVA not identified on MRI. Recent right lenticular nucleus infarct May 2014 with little residual  2. DVT Prophylaxis/Anticoagulation: Xarelto. Monitor for any bleeding episodes  3. Pain Management/headaches. Continue low-dose Depakote.  4. Neuropsych: This patient is capable of making decisions on her own behalf.  5. Hypertension. Norvasc 2.5 mg daily. Monitor with increased mobility .adequate control No  hypotension in the past 24 hours 6. Hyperlipidemia. Lipitor  7. Rate Controlled A Fib/- reviewed EKG- latest with rate controlled a . flutter  LOS (Days) 3 A FACE TO FACE EVALUATION WAS PERFORMED  [name],[name] 02/18/2013, 9:22 AM

## 2013-02-19 ENCOUNTER — Inpatient Hospital Stay (HOSPITAL_COMMUNITY): Payer: Medicare Other | Admitting: Rehabilitation

## 2013-02-19 ENCOUNTER — Telehealth: Payer: Self-pay | Admitting: Neurology

## 2013-02-19 ENCOUNTER — Inpatient Hospital Stay (HOSPITAL_COMMUNITY): Payer: Medicare Other | Admitting: Occupational Therapy

## 2013-02-19 ENCOUNTER — Inpatient Hospital Stay (HOSPITAL_COMMUNITY): Payer: Medicare Other | Admitting: *Deleted

## 2013-02-19 DIAGNOSIS — G8110 Spastic hemiplegia affecting unspecified side: Secondary | ICD-10-CM

## 2013-02-19 DIAGNOSIS — I6330 Cerebral infarction due to thrombosis of unspecified cerebral artery: Secondary | ICD-10-CM

## 2013-02-19 NOTE — Progress Notes (Signed)
Physical Therapy Session Note  Patient Details  Name: [name] MRN: [id number] Date of Birth: [birth year]  Today's Date: 02/19/2013 Time: 0930-1026 Time Calculation (min): 56 min  Short Term Goals: Week 1:  PT Short Term Goal 1 (Week 1): Pt will perform bed mobiltiy with Mod A PT Short Term Goal 2 (Week 1): Pt will perform bed<>chair transfers with min A PT Short Term Goal 3 (Week 1): Pt will propel WC x150 with S PT Short Term Goal 4 (Week 1): Pt will perform gait with RW x50' with Min A PT Short Term Goal 5 (Week 1): Pt will ascend/descend 5 steps with bil Rails and Min A  Skilled Therapeutic Interventions/Progress Updates:    Patient received sitting in recliner. Session focused on functional transfers, gait training, and stair negotiation; see details below. When asking patient about home environment, patient reports her daughters are looking into installing a stair lift; unsure if this will be done before d/c. Patient becomes fatigued very quickly and requires rest breaks between each activity. Patient particularly fatigued after second bout of gait training, vitals taken, see below. When seated edge of mat resting between activities, patient requesting to lay down. Patient performs scooting along edge of mat x2 towards pillows to lay down. Patient performs scooting and sit<>supine on flat mat with supervision. Patient returned to room, transfers wheelchair>bed via stand pivot with min A, supervision for sit>supine. Patient left supine in bed with all needs within reach.  Therapy Documentation Precautions:  Precautions Precautions: Fall Restrictions Weight Bearing Restrictions: No Vital Signs: Therapy Vitals Pulse Rate: 98 BP: 118/82 mmHg (s/p gait) Patient Position, if appropriate: Sitting Oxygen Therapy SpO2: 94 % O2 Device: None (Room air) Pain: Pain Assessment Pain Assessment: No/denies pain Pain Score: 0-No pain Locomotion : Ambulation Ambulation:  Yes Ambulation/Gait Assistance: 4: Min assist Ambulation Distance (Feet): 80 Feet Assistive device: Rolling walker Ambulation/Gait Assistance Details: Verbal cues for gait pattern;Verbal cues for precautions/safety;Tactile cues for weight shifting Ambulation/Gait Assistance Details: Patient instructed in gait training 44' x1 and 63' x1 with RW in controlled environment and busy gym with several obstacles. Patient requires verbal cues to look up to assist with obstacle/people negotiation and for L foot heel strike with increased fatigue. Gait Gait: Yes Gait Pattern: Impaired Gait Pattern: Step-through pattern;Decreased stride length;Trunk flexed;Decreased dorsiflexion - left Stairs / Additional Locomotion Stairs: Yes Stairs Assistance: 4: Min assist Stairs Assistance Details: Verbal cues for precautions/safety;Tactile cues for posture Stairs Assistance Details (indicate cue type and reason): Verbal cues for placement of whole foot on step. Stair Management Technique: One rail Right;Step to pattern;Sideways Number of Stairs: 3 (Patient reports too fatigued to negotiate any more stairs) Height of Stairs: 5   See FIM for current functional status  Therapy/Group: Individual Therapy  Chipper Herb. Zakee Deerman, PT, DPT 02/19/2013, 10:29 AM

## 2013-02-19 NOTE — Progress Notes (Signed)
Physical Therapy Session Note  Patient Details  Name: [name] MRN: [id number] Date of Birth: 1925/02/17  Today's Date: 02/19/2013 Time: 1430-1510 Time Calculation (min): 40 min  Short Term Goals: Week 1:  PT Short Term Goal 1 (Week 1): Pt will perform bed mobiltiy with Mod A PT Short Term Goal 2 (Week 1): Pt will perform bed<>chair transfers with min A PT Short Term Goal 3 (Week 1): Pt will propel WC x150 with S PT Short Term Goal 4 (Week 1): Pt will perform gait with RW x50' with Min A PT Short Term Goal 5 (Week 1): Pt will ascend/descend 5 steps with bil Rails and Min A  Skilled Therapeutic Interventions/Progress Updates:   Pt received lying in bed this afternoon with daughter present for most of session.  Performed supine to sit at min/guard for trunk to getting fully seated at EOB.  Performed gait training to/from gym at min assist with RW.  See full details below.  Remainder of session focused on performing OTAGO HEP for strengthening, balance and endurance.  Performed LAQs BLEs x 10 reps each, standing hip abd x 5 reps each LE, standing knee flex x 10 reps on LLE, mini squats x 5 reps and heel raises x 10 reps.  She requires several seated rest breaks in between exercises due to fatigue.  Pt very fatigued at end of session, therefore deferred toe raises and ambulated back to room.  Pt requires min assist to elevate LLE into bed, however did well scooting hips once in bed.  Left in bed with 3 handrails and bed alarm set.  All needs in reach.    Therapy Documentation Precautions:  Precautions Precautions: Fall Restrictions Weight Bearing Restrictions: No   Pain: Pt states some pain in back of neck during end of session.  Suggested that she try heat to relax musculature.  Allowed rest breaks during session.    Locomotion : Ambulation Ambulation: Yes Ambulation/Gait Assistance: 4: Min assist Ambulation Distance (Feet): 80 Feet (45 x 1, then another 35') Assistive device:  Rolling walker Ambulation/Gait Assistance Details: Verbal cues for gait pattern;Verbal cues for precautions/safety;Tactile cues for weight shifting Ambulation/Gait Assistance Details: Pt instructed in gait training at distances mentioned above at min assist level in controlled and carpeted environment.  Note she continues to have increased difficulty in busy environment and at times requires assist to negotiate around objects and keep RW with her until all the way at seated surface.   She requires verbal cues for maintaining upright posture throughout, proper positioning inside of RW and increasing hip/knee flex on LLE, esp on carpeted surface.   Gait Gait: Yes Gait Pattern: Impaired Gait Pattern: Step-through pattern;Decreased stride length;Trunk flexed;Decreased dorsiflexion - left   See FIM for current functional status  Therapy/Group: Individual Therapy  Vista Deck 02/19/2013, 3:46 PM

## 2013-02-19 NOTE — Progress Notes (Signed)
Occupational Therapy Session Note  Patient Details  Name: [name] MRN: [id number] Date of Birth: 1925/03/16  Today's Date: 02/19/2013 Time: 0805-0900 and 1100-1130 Time Calculation (min): 55 min and 30 min  Short Term Goals: Week 1:  OT Short Term Goal 1 (Week 1): Pt will demonstrate improved standing balance to stand with min assist as she pulls pants up/down hips. OT Short Term Goal 2 (Week 1): Pt will be able to transfer to elevated toilet seat with min assist from w/c. OT Short Term Goal 3 (Week 1): Pt will be able to transfer to shower seat with grab bars with min assist. OT Short Term Goal 4 (Week 1): Pt's LUE strength will improve from a 3+/5 to a 4/5 to enable her to use her arm more actively with transfers.  Skilled Therapeutic Interventions/Progress Updates:    Visit 1: No c/o pain.   Pt seen for BADL retraining of toileting, bathing, and dressing with a focus on use of LUE, functional mobility, standing balance.  Pt stood from w/c and ambulated with RW to toilet and then to shower chair with min assist.  She was able to bathe self and leaned forward to wash feet. Pt then began to feel nauseous and appeared to be much weaker and slightly disoriented when asked to transfer directly to the w/c.She needed mod assist out of the shower. Her blood pressure was 127/73.  Pt was encouraged to rest and drink water for a few minutes. She then completed her dressing and grooming with no further nausea.  Pt's dynamic standing balance and use of LUE has improved greatly in that she only needs min assist with LB dressing versus total assist on eval.  Pt then rested in W/C and engaged in BUE AROM exercises focusing on shoulder with lifting arms forward and overhead. Pt resting in chair with call light in reach.  Visit 2:  No c/o pain. Pt was in bed and requested to use restroom. Pt ambulated to bathroom with RW with min assist.  Pt continues to have difficulty fully pulling pants over hips.  Pt  washed hands at sink and then went to gym in w/c to focus on LUE strength and overall activity tolerance.  Pt used arm bicycle (UBE) for 6 minutes with 1 minute of work and 1 minute of rest alternating. She then used 1# dowel bar for shoulder and chest presses and L wrist coordination exercises.  Pt has 15 lbs of L grip and 30 lbs of R grip strength. Pt returned to room and requested to get in bed. Pt resting in bed with call light in reach.  Therapy Documentation Precautions:  Precautions Precautions: Fall Restrictions Weight Bearing Restrictions: No    Vital Signs: Therapy Vitals Temp: 98.3 F (36.8 C) Temp src: Oral Pulse Rate: 76 Resp: 18 BP: 127/73 mmHg Patient Position, if appropriate: Sitting Oxygen Therapy SpO2: 94 % O2 Device: None (Room air) Pain: Pain Assessment Pain Assessment: No/denies pain ADL:  See FIM for current functional status  Therapy/Group: Individual Therapy  SAGUIER,JULIA 02/19/2013, 9:16 AM

## 2013-02-19 NOTE — Telephone Encounter (Signed)
Please advise 

## 2013-02-19 NOTE — IPOC Note (Signed)
Overall Plan of Care [facility]) Patient Details Name: [name] MRN: [id number] DOB: [birth year]  Admitting Diagnosis: R CVA  Hospital Problems: Principal Problem:   Subcortical infarction     Functional Problem List: Nursing Bladder;Bowel;Endurance;Medication Management;Motor;Nutrition;Pain;Perception;Safety;Skin Integrity  PT Balance;Endurance;Motor;Pain;Safety;Sensory  OT Balance;Motor;Safety;Endurance  SLP    TR         Basic ADL's: OT Bathing;Dressing;Toileting     Advanced  ADL's: OT       Transfers: PT Bed Mobility;Bed to Chair;Car;Furniture  OT Toilet;Tub/Shower     Locomotion: PT Ambulation;Wheelchair Mobility;Stairs     Additional Impairments: OT Fuctional Use of Upper Extremity  SLP        TR      Anticipated Outcomes Item Anticipated Outcome  Self Feeding I  Swallowing      Basic self-care  supervision with LB self care; I with UB self care  Toileting  supervision   Bathroom Transfers supervision  Bowel/Bladder  Manage bowel and bladder with minimal assistance.  Transfers  Supervision  Locomotion  Supervision  Communication     Cognition     Pain  Pain level 3 or less on a scale 19f 0-10  Safety/Judgment  Safety/Judgement with minimal assistance.   Therapy Plan: PT Intensity: Minimum of 1-2 x/day ,45 to 90 minutes PT Frequency: 5 out of 7 days PT Duration Estimated Length of Stay: 2 weeks OT Intensity: Minimum of 1-2 x/day, 45 to 90 minutes OT Frequency: 5 out of 7 days OT Duration/Estimated Length of Stay: 2 weeks         Team Interventions: Nursing Interventions Patient/Family Education;Bladder Management;Bowel Management;Disease Management/Prevention;Pain Management;Medication Management;Discharge Planning;Psychosocial Support;Skin Care/Wound Management  PT interventions Ambulation/gait training;Balance/vestibular training;Community reintegration;Discharge planning;Disease management/prevention;DME/adaptive equipment  instruction;Functional mobility training;Neuromuscular re-education;Pain management;Patient/family education;Psychosocial support;Splinting/orthotics;Therapeutic Activities;Stair training;Therapeutic Exercise;UE/LE Strength taining/ROM;UE/LE Coordination activities;Wheelchair propulsion/positioning  OT Interventions Financial controller;Functional mobility training;Neuromuscular re-education;Patient/family education;Self Care/advanced ADL retraining;Therapeutic Activities;Therapeutic Exercise;UE/LE Strength taining/ROM;UE/LE Coordination activities  SLP Interventions    TR Interventions    SW/CM Interventions Discharge Planning;Psychosocial Support;Patient/Family Education    Team Discharge Planning: Destination: PT-Home ,OT- Home , SLP-  Projected Follow-up: PT-Home health PT;24 hour supervision/assistance, OT-  Home health OT, SLP-  Projected Equipment Needs: PT-To be determined;Wheelchair cushion (measurements);Wheelchair (measurements);Rolling walker with 5" wheels;Other (comment) (Pt has 4WW, hemi-walker, and transport chair), OT- 3 in 1 bedside comode, SLP-  Patient/family involved in discharge planning: PT- Patient;Family member/caregiver,  OT-Patient;Family member/caregiver, SLP-   MD ELOS: 2-3wks Medical Rehab Prognosis:  Good Assessment: 79 y.o. 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 Admitted 02/12/2013 with new onset of left facial droop as well as left-sided weakness. Recent admit 02/05/2013 for recurrent left-sided weakness with MRI showing expected evolution of right corona radiata lentiform nuclei infarct which did occur in May no new acute abnormalities. Followup MRI 02/12/2013 again showing no evidence of acute infarct. Patient did not receive TPA. EEG negative for seizure. MRI lumbar spine showed no  unilateral abnormality to explain left hemiplegia. Neurology followup patient remains on Xarelto for CVA prophylaxis workup indicated small subcortical infarct small vessel involvement not seen on MRI. Noted intermittent bouts of headache placed on low-dose Depakote.   Now requiring 24/7 Rehab RN,MD, as well as CIR level PT, OT and SLP.  Treatment team will focus on ADLs and mobility with goals set at Supervision   See Team Conference Notes for weekly updates to the plan of care

## 2013-02-19 NOTE — Progress Notes (Signed)
[name] is a 79 y.o. 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 Admitted 02/12/2013 with new onset of left facial droop as well as left-sided weakness. Recent admit 02/05/2013 for recurrent left-sided weakness with MRI showing expected evolution of right corona radiata lentiform nuclei infarct which did occur in May no new acute abnormalities. Followup MRI 02/12/2013 again showing no evidence of acute infarct. Patient did not receive TPA. EEG negative for seizure. MRI lumbar spine showed no unilateral abnormality to explain left hemiplegia. Neurology followup patient remains on Xarelto for CVA prophylaxis workup indicated small subcortical infarct small vessel involvement not seen on MRI. Noted intermittent bouts of headache placed on low-dose Depakote Subjective/Complaints:   Objective: Vital Signs: Blood pressure 127/73, pulse 76, temperature 98.3 F (36.8 C), temperature source Oral, resp. rate 18, height 5\' 7"  (1.702 m), weight 57.5 kg (126 lb 12.2 oz), SpO2 94.00%. No results found. No results found for this or any previous visit (from the past 72 hour(s)).   HEENT: normal Cardio: irregular Resp: CTA B/L and unlabored GI: BS positive and NT,ND Extremity:  Pulses positive and No Edema Skin:   Intact Neuro: Alert/Oriented, Cranial Nerve II-XII normal, Abnormal Sensory reduced LT on L side, Abnormal Motor 5/5 on R and 4-/5 LUE, 2-/5 Left HF , KE, ankle DF/PF, Abnormal FMC Ataxic/ dec FMC and Tone:  Within Normal Limits Musc/Skel:  Normal GEN NAD   Assessment/Plan: 1. Functional deficits secondary to Right subcortical infarct with LLE>>LUE weakness which require 3+ hours per day of interdisciplinary therapy in a comprehensive inpatient rehab setting. Physiatrist is providing close team supervision and 24 hour management of active medical problems listed  below. Physiatrist and rehab team continue to assess barriers to discharge/monitor patient progress toward functional and medical goals. FIM: FIM - Bathing Bathing Steps Patient Completed: Chest;Right Arm;Left Arm;Abdomen;Front perineal area;Buttocks;Right upper leg;Left upper leg;Right lower leg (including foot);Left lower leg (including foot) Bathing: 4: Steadying assist  FIM - Upper Body Dressing/Undressing Upper body dressing/undressing steps patient completed: Thread/unthread right sleeve of pullover shirt/dresss;Thread/unthread left sleeve of pullover shirt/dress;Put head through opening of pull over shirt/dress;Pull shirt over trunk Upper body dressing/undressing: 5: Set-up assist to: Obtain clothing/put away FIM - Lower Body Dressing/Undressing Lower body dressing/undressing steps patient completed: Thread/unthread right underwear leg;Thread/unthread left underwear leg;Thread/unthread right pants leg;Thread/unthread left pants leg;Pull pants up/down;Don/Doff right shoe;Don/Doff left shoe Lower body dressing/undressing: 4: Min-Patient completed 75 plus % of tasks  FIM - Toileting Toileting steps completed by patient: Performs perineal hygiene;Adjust clothing prior to toileting Toileting Assistive Devices: Grab bar or rail for support Toileting: 3: Mod-Patient completed 2 of 3 steps  FIM - Diplomatic Services operational officer Devices: Art gallery manager Transfers: 4-From toilet/BSC: Min A (steadying Pt. > 75%);4-To toilet/BSC: Min A (steadying Pt. > 75%)  FIM - Bed/Chair Transfer Bed/Chair Transfer Assistive Devices: Arm rests Bed/Chair Transfer: 5: Supine > Sit: Supervision (verbal cues/safety issues)  FIM - Locomotion: Wheelchair Distance: 40 Locomotion: Wheelchair: 0: Activity did not occur FIM - Locomotion: Ambulation Locomotion: Ambulation Assistive Devices: Designer, industrial/product Ambulation/Gait Assistance: 3: Mod assist Locomotion: Ambulation: 1: Travels less than 50 ft with  moderate assistance (Pt: 50 - 74%)  Comprehension Comprehension Mode: Auditory Comprehension: 6-Follows complex conversation/direction: With extra time/assistive device  Expression Expression Mode: Verbal Expression: 6-Expresses complex ideas: With extra time/assistive device  Social Interaction Social Interaction: 6-Interacts appropriately with others with medication or extra time (anti-anxiety,  antidepressant).  Problem Solving Problem Solving: 6-Solves complex problems: With extra time  Memory Memory: 6-More than reasonable amt of time  Medical Problem List and Plan:  1. Suspect small subcortical CVA not identified on MRI. Recent right lenticular nucleus infarct May 2014 with little residual  2. DVT Prophylaxis/Anticoagulation: Xarelto. Monitor for any bleeding episodes  3. Pain Management/headaches. Continue low-dose Depakote.  4. Neuropsych: This patient is capable of making decisions on her own behalf.  5. Hypertension. Norvasc 2.5 mg daily. Monitor with increased mobility  6. Hyperlipidemia. Lipitor  7. Rate Controlled A Fib  LOS (Days) 4 A FACE TO FACE EVALUATION WAS PERFORMED  [name] 02/19/2013, 10:13 AM

## 2013-02-20 ENCOUNTER — Inpatient Hospital Stay (HOSPITAL_COMMUNITY): Payer: Medicare Other | Admitting: Physical Therapy

## 2013-02-20 ENCOUNTER — Inpatient Hospital Stay (HOSPITAL_COMMUNITY): Payer: Medicare Other | Admitting: *Deleted

## 2013-02-20 ENCOUNTER — Inpatient Hospital Stay (HOSPITAL_COMMUNITY): Payer: Medicare Other | Admitting: Occupational Therapy

## 2013-02-20 NOTE — Progress Notes (Signed)
[name] is a 79 y.o. 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 Admitted 02/12/2013 with new onset of left facial droop as well as left-sided weakness. Recent admit 02/05/2013 for recurrent left-sided weakness with MRI showing expected evolution of right corona radiata lentiform nuclei infarct which did occur in May no new acute abnormalities. Followup MRI 02/12/2013 again showing no evidence of acute infarct. Patient did not receive TPA. EEG negative for seizure. MRI lumbar spine showed no unilateral abnormality to explain left hemiplegia. Neurology followup patient remains on Xarelto for CVA prophylaxis workup indicated small subcortical infarct small vessel involvement not seen on MRI. Noted intermittent bouts of headache placed on low-dose Depakote Subjective/Complaints:   Objective: Vital Signs: Blood pressure 121/74, pulse 65, temperature 98.3 F (36.8 C), temperature source Oral, resp. rate 16, height 5\' 7"  (1.702 m), weight 57.5 kg (126 lb 12.2 oz), SpO2 95.00%. No results found. No results found for this or any previous visit (from the past 72 hour(s)).   HEENT: normal Cardio: irregular Resp: CTA B/L and unlabored GI: BS positive and NT,ND Extremity:  Pulses positive and No Edema Skin:   Intact Neuro: Alert/Oriented, Cranial Nerve II-XII normal, Abnormal Sensory reduced LT on L side, Abnormal Motor 5/5 on R and 4-/5 LUE, 3-/5 Left HF , KE, ankle DF/PF, Abnormal FMC Ataxic/ dec FMC and Tone:  Within Normal Limits Musc/Skel:  Normal GEN NAD   Assessment/Plan: 1. Functional deficits secondary to Right subcortical infarct with LLE>>LUE weakness which require 3+ hours per day of interdisciplinary therapy in a comprehensive inpatient rehab setting. Physiatrist is providing close team supervision and 24 hour management of active medical problems listed  below. Physiatrist and rehab team continue to assess barriers to discharge/monitor patient progress toward functional and medical goals. FIM: FIM - Bathing Bathing Steps Patient Completed: Chest;Right Arm;Left Arm;Abdomen;Front perineal area;Buttocks;Right upper leg;Left upper leg;Right lower leg (including foot);Left lower leg (including foot) Bathing: 4: Steadying assist  FIM - Upper Body Dressing/Undressing Upper body dressing/undressing steps patient completed: Thread/unthread right sleeve of pullover shirt/dresss;Thread/unthread left sleeve of pullover shirt/dress;Put head through opening of pull over shirt/dress;Pull shirt over trunk Upper body dressing/undressing: 5: Set-up assist to: Obtain clothing/put away FIM - Lower Body Dressing/Undressing Lower body dressing/undressing steps patient completed: Thread/unthread right underwear leg;Thread/unthread left underwear leg;Thread/unthread right pants leg;Thread/unthread left pants leg;Pull pants up/down;Don/Doff right shoe;Don/Doff left shoe Lower body dressing/undressing: 4: Min-Patient completed 75 plus % of tasks  FIM - Toileting Toileting steps completed by patient: Adjust clothing prior to toileting;Performs perineal hygiene;Adjust clothing after toileting Toileting Assistive Devices: Grab bar or rail for support Toileting: 4: Steadying assist  FIM - Diplomatic Services operational officer Devices: Art gallery manager Transfers: 4-From toilet/BSC: Min A (steadying Pt. > 75%);4-To toilet/BSC: Min A (steadying Pt. > 75%)  FIM - Bed/Chair Transfer Bed/Chair Transfer Assistive Devices: Arm rests Bed/Chair Transfer: 5: Supine > Sit: Supervision (verbal cues/safety issues);5: Sit > Supine: Supervision (verbal cues/safety issues);4: Bed > Chair or W/C: Min A (steadying Pt. > 75%);4: Chair or W/C > Bed: Min A (steadying Pt. > 75%)  FIM - Locomotion: Wheelchair Distance: 40 Locomotion: Wheelchair: 1: Total Assistance/staff pushes  wheelchair (Pt<25%) FIM - Locomotion: Ambulation Locomotion: Ambulation Assistive Devices: Designer, industrial/product Ambulation/Gait Assistance: 4: Min assist Locomotion: Ambulation: 2: Travels 50 - 149 ft with minimal assistance (Pt.>75%)  Comprehension Comprehension Mode: Auditory Comprehension: 6-Follows complex conversation/direction: With extra time/assistive device  Expression  Expression Mode: Verbal Expression: 6-Expresses complex ideas: With extra time/assistive device  Social Interaction Social Interaction: 7-Interacts appropriately with others - No medications needed.  Problem Solving Problem Solving: 6-Solves complex problems: With extra time  Memory Memory: 6-More than reasonable amt of time  Medical Problem List and Plan:  1. Suspect small subcortical CVA not identified on MRI. Recent right lenticular nucleus infarct May 2014 with little residual  2. DVT Prophylaxis/Anticoagulation: Xarelto. Monitor for any bleeding episodes  3. Pain Management/headaches. Continue low-dose Depakote.  4. Neuropsych: This patient is capable of making decisions on her own behalf.  5. Hypertension. Norvasc 2.5 mg daily. Monitor with increased mobility  6. Hyperlipidemia. Lipitor  7. Rate Controlled A Fib  LOS (Days) 5 A FACE TO FACE EVALUATION WAS PERFORMED  Baelyn Doring E 02/20/2013, 9:29 AM

## 2013-02-20 NOTE — Progress Notes (Signed)
Physical Therapy Note  Patient Details  Name: [name] MRN: [id number] Date of Birth: 1924/08/21 Today's Date: 02/20/2013  Time: 1300-1330 30 minutes  1:1 No c/o pain.  Pt performed gait with RW with close supervision-min A in controlled environment. Pt requires cues not to shuffle/drag L LE when fatigued, improves with cues and manual facilitation at hip flexors.  Stair negotiation to simulate home staircase with 1 handrail on R.  Pt able to perform with min A, 6 stairs x 2 with prolonged rest break in between due to fatigue.  Standing stepping with L LE with focus on hip/knee flexion for foot clearance during gait.  Pt requires min manual facilitation to perform, fatigues after 3-4 reps requiring 1 minute seated rest for recovery.   DONAWERTH,KAREN 02/20/2013, 1:30 PM

## 2013-02-20 NOTE — Telephone Encounter (Signed)
Spoke with Kit.  Confirmed she is requesting intermittent leave to care for her mom. Advised her that form will be available for pick up at the front desk this afternoon.

## 2013-02-20 NOTE — Progress Notes (Signed)
Occupational Therapy Session Note  Patient Details  Name: [name] MRN: [id number] Date of Birth: 1924/06/30  Today's Date: 02/20/2013 Time: 0800-0900 Time Calculation (min): 60 min  Short Term Goals: Week 1:  OT Short Term Goal 1 (Week 1): Pt will demonstrate improved standing balance to stand with min assist as she pulls pants up/down hips. OT Short Term Goal 2 (Week 1): Pt will be able to transfer to elevated toilet seat with min assist from w/c. OT Short Term Goal 3 (Week 1): Pt will be able to transfer to shower seat with grab bars with min assist. OT Short Term Goal 4 (Week 1): Pt's LUE strength will improve from a 3+/5 to a 4/5 to enable her to use her arm more actively with transfers.  Skilled Therapeutic Interventions/Progress Updates:      Pt seen for BADL retraining of toileting, bathing, and dressing with a focus on safe functional mobility, use of LUE, standing balance and activity tolerance.  Pt demonstrated a great deal of progress with her self care, only needing assist with TED hose. She did need several rest breaks as she felt nauseas after her shower.  Pt continues to need min assist to steady balance with transfer but can now stand and perform LB self care with supervision.  Pt then worked on LUE strengthening from mat in gym using B shoulder flexion with small physioball and elbow and forearm exercises with 2# hand weight. She also worked on trunk extension and lateral leans to left to further improve her sitting balance.  Pt then rested on the mat in supine as her PT session was next.  Therapy Documentation Precautions:  Precautions Precautions: Fall Restrictions Weight Bearing Restrictions: No Therapy Vitals Pulse Rate: 90 BP: 106/70 mmHg Patient Position, if appropriate: Lying Pain: Pain Assessment Pain Assessment: No/denies pain ADL:  See FIM for current functional status  Therapy/Group: Individual Therapy  SAGUIER,JULIA 02/20/2013, 10:59 AM

## 2013-02-20 NOTE — Progress Notes (Signed)
NUTRITION FOLLOW UP  Intervention:   Discussed nutrition needs and interventions with pt.  Pt is very reluctant to try anything new.  She knows her likes and dislikes.  Pt happy to hear she is choosing high-calorie, high-protein foods and is willing to continue with this plan.  Discussed nutrition needs and why nutrition is important for her recovery.  Pt agrees nutrition is important. No supplements at this time.   Informed patient of dark chocolate Ensure which may be a more tasty choice for her at home.   Nutrition Dx:   Increased nutrient needs related to participation in therapies as evidenced by estimated needs.   Goal:  Intake to meet >90% of estimated nutrition needs.   Monitor:  weight trends, lab trends, I/O's, PO intake, supplement tolerance   Assessment:   PMHx significant for afib, HTN, CVA in May 2014. Admitted 11/17 with suspect small subcortical CVA not identified on MRI.  Pt diet was recently liberalized to Regular, however intake has overall remained variable from 25-100% of meals.  Pt is a small eater at baseline with very defined food preferences. She states she has been able to find foods she likes in the hospital and has been choosing high-calorie, high-protein options such as BBQ and tuna fish.    Height: Ht Readings from Last 1 Encounters:  02/15/13 5\' 7"  (1.702 m)    Weight Status:   Wt Readings from Last 1 Encounters:  02/20/13 122 lb 14.4 oz (55.747 kg)    Re-estimated needs:  Kcal: 1600 - 1800  Protein: 65 - 75 g  Fluid: 1.6 - 1.8 liters  Skin: no issues noted  Diet Order: General   Intake/Output Summary (Last 24 hours) at 02/20/13 1508 Last data filed at 02/20/13 1200  Gross per 24 hour  Intake    600 ml  Output      2 ml  Net    598 ml    Last BM: 11/24   Labs:   Recent Labs Lab 02/16/13 0603  NA 143  K 4.0  CL 108  CO2 29  BUN 16  CREATININE 0.77  CALCIUM 8.7  GLUCOSE 101*    CBG (last 3)  No results found for this  basename: GLUCAP,  in the last 72 hours  Scheduled Meds: . amLODipine  2.5 mg Oral Daily  . atorvastatin  40 mg Oral q1800  . divalproex  250 mg Oral Daily  . feeding supplement (RESOURCE BREEZE)  1 Container Oral Q24H  . multivitamin with minerals  1 tablet Oral Daily  . Rivaroxaban  15 mg Oral Q supper    Continuous Infusions:   Loyce Dys, MS RD LDN Clinical Inpatient Dietitian Pager: [phone] Weekend/After hours pager: [phone]

## 2013-02-20 NOTE — Progress Notes (Signed)
Physical Therapy Session Note  Patient Details  Name: [name] MRN: [id number] Date of Birth: Aug 11, 1924  Today's Date: 02/20/2013 Time: 4098-1191 Time Calculation (min): 58 min  Short Term Goals: Week 1:  PT Short Term Goal 1 (Week 1): Pt will perform bed mobiltiy with Mod A PT Short Term Goal 2 (Week 1): Pt will perform bed<>chair transfers with min A PT Short Term Goal 3 (Week 1): Pt will propel WC x150 with S PT Short Term Goal 4 (Week 1): Pt will perform gait with RW x50' with Min A PT Short Term Goal 5 (Week 1): Pt will ascend/descend 5 steps with bil Rails and Min A  Skilled Therapeutic Interventions/Progress Updates:     Pt resting on mat, fatigued but agreeable to PT. Tx focused on NMR for LLE activation and coordination, gait with RW, and Berg balance test.  Supine NMR inclduing each of the following 2x10 with cues for techniuque: bridging, L heel slides, L SLR to target for coordination.  Supine>sit with S,  Seated L hip flexion 2x10 Gait with RW 1x88' and 1x45' with Min A and cues for posture, reduced UE weigh bearing, and L foot clearance.  Performed Berg balance test, pt needing to sit and rest after every 2 items due to fatigue. Score = 22/56, educated pt on findings and functional implications of falls risk.  Pt returned to room, wanting to rest in bed, Min A for all stand-pivot transfers throughout tx, with cues to complete turn. ' Sit>supine with Min A for positioning in bed.  Bed alarm on and all needs in reach.    Therapy Documentation Precautions:  Precautions Precautions: Fall Restrictions Weight Bearing Restrictions: No    Pain: None      Locomotion : Ambulation Ambulation/Gait Assistance: 4: Min assist  Trunk/Postural Assessment :    Balance: Standardized Balance Assessment Standardized Balance Assessment: Berg Balance Test Berg Balance Test Sit to Stand: Able to stand  independently using hands Standing Unsupported: Able to stand 30  seconds unsupported Sitting with Back Unsupported but Feet Supported on Floor or Stool: Able to sit safely and securely 2 minutes Stand to Sit: Uses backs of legs against chair to control descent Transfers: Needs one person to assist Standing Unsupported with Eyes Closed: Able to stand 10 seconds with supervision Standing Ubsupported with Feet Together: Needs help to attain position but able to stand for 30 seconds with feet together From Standing, Reach Forward with Outstretched Arm: Can reach confidently >25 cm (10") From Standing Position, Pick up Object from Floor: Unable to try/needs assist to keep balance From Standing Position, Turn to Look Behind Over each Shoulder: Turn sideways only but maintains balance Turn 360 Degrees: Needs assistance while turning Standing Unsupported, Alternately Place Feet on Step/Stool: Needs assistance to keep from falling or unable to try Standing Unsupported, One Foot in Front: Loses balance while stepping or standing Standing on One Leg: Unable to try or needs assist to prevent fall Total Score: 22  See FIM for current functional status  Therapy/Group: Individual Therapy Clydene Laming, PT, DPT 02/20/2013, 10:16 AM

## 2013-02-20 NOTE — Progress Notes (Signed)
Physical Therapy Session Note  Patient Details  Name: [name] MRN: [id number] Date of Birth: May 19, 1924  Today's Date: 02/20/2013 Time: 1445-1530 Time Calculation (min): 45 min  Short Term Goals: Week 1:  PT Short Term Goal 1 (Week 1): Pt will perform bed mobiltiy with Mod A PT Short Term Goal 2 (Week 1): Pt will perform bed<>chair transfers with min A PT Short Term Goal 3 (Week 1): Pt will propel WC x150 with S PT Short Term Goal 4 (Week 1): Pt will perform gait with RW x50' with Min A PT Short Term Goal 5 (Week 1): Pt will ascend/descend 5 steps with bil Rails and Min A  Skilled Therapeutic Interventions/Progress Updates:    Pt received supine in bed with daughter present. Pt performed gait training to and from therapy gym with min A and vcs to improve step length of L LE. Standing [place] exercises 2 x 10 to improve strength and standing balance and tolerance. standing static and dynamic balance activities with attempting single leg stance. Pt returned to room supine in bed with all needs within reach.   Therapy Documentation Precautions:  Precautions Precautions: Fall Restrictions Weight Bearing Restrictions: No    Pain: no pain/ denies pain                     See FIM for current functional status  Therapy/Group: Individual Therapy  Jackelyn Knife 02/20/2013, 4:31 PM

## 2013-02-21 ENCOUNTER — Inpatient Hospital Stay (HOSPITAL_COMMUNITY): Payer: Medicare Other

## 2013-02-21 ENCOUNTER — Inpatient Hospital Stay (HOSPITAL_COMMUNITY): Payer: Medicare Other | Admitting: Occupational Therapy

## 2013-02-21 ENCOUNTER — Inpatient Hospital Stay (HOSPITAL_COMMUNITY): Payer: Medicare Other | Admitting: *Deleted

## 2013-02-21 MED ORDER — ACETAMINOPHEN-CODEINE #3 300-30 MG PO TABS
1.0000 | ORAL_TABLET | Freq: Four times a day (QID) | ORAL | Status: DC | PRN
  Administered 2013-02-21 – 2013-02-26 (×13): 1 via ORAL
  Filled 2013-02-21 (×4): qty 1
  Filled 2013-02-21: qty 2
  Filled 2013-02-21 (×8): qty 1

## 2013-02-21 NOTE — Progress Notes (Signed)
Physical Therapy Session Note  Patient Details  Name: [name] MRN: [id number] Date of Birth: Apr 10, 1924  Today's Date: 02/21/2013 Time: 1300-1315 Time Calculation (min): 15 min  Short Term Goals: Week 1:  PT Short Term Goal 1 (Week 1): Pt will perform bed mobiltiy with Mod A PT Short Term Goal 2 (Week 1): Pt will perform bed<>chair transfers with min A PT Short Term Goal 3 (Week 1): Pt will propel WC x150 with S PT Short Term Goal 4 (Week 1): Pt will perform gait with RW x50' with Min A PT Short Term Goal 5 (Week 1): Pt will ascend/descend 5 steps with bil Rails and Min A  Skilled Therapeutic Interventions/Progress Updates:    RN present and pt awaiting transport for CT for LUE (s/p fall this AM) but pt with need to use BSC. With encouragement, agreeable to use BSC instead of bedpan and transferred to [facility] with +2 (due to pt being fearful) but overall minA and A with clothing management needed. Returned to supine and positioned to comfort as best possible as transport arriving for CT.  Therapy Documentation Precautions:  Precautions Precautions: Fall Restrictions Weight Bearing Restrictions: No General: Amount of Missed PT Time (min): 15 Minutes Missed Time Reason: CT/MRI  Pain:  c/o pain in L shoulder/arm with movement - RN present and aware (due to fall from this morning). Pt with sling on right now.  See FIM for current functional status  Therapy/Group: Individual Therapy  Karolee Stamps [facility] 02/21/2013, 1:18 PM

## 2013-02-21 NOTE — Progress Notes (Signed)
Physical Therapy Note  Patient Details  Name: [name] MRN: [id number] Date of Birth: 1924/05/16 Today's Date: 02/21/2013 Pt missed 45 min AM tx due to Xray; pt had assisted fall during previous therapy.  Haley Fuerstenberg 02/21/2013, 11:50 AM

## 2013-02-21 NOTE — Patient Care Conference (Signed)
Inpatient RehabilitationTeam Conference and Plan of Care Update Date: 02/21/2013   Time: 11:45 Am    Patient Name: [name]      Medical Record Number: [id number]  Date of Birth: 1925/03/01 Sex: Female         Room/Bed: 4M05C/4M05C-01 Payor Info: Payor: MEDICARE / Plan: MEDICARE PART A AND B / Product Type: *No Product type* /    Admitting Diagnosis: R CVA  Admit Date/Time:  02/15/2013  6:11 PM Admission Comments: No comment available   Primary Diagnosis:  Subcortical infarction Principal Problem: Subcortical infarction  Patient Active Problem List   Diagnosis Date Noted  . Subcortical infarction 02/16/2013  . Possible Seizures 02/14/2013  . TIA (transient ischemic attack) 02/12/2013  . Stroke-like symptom 02/12/2013  . Hyperglycemia 02/12/2013  . High cholesterol   . CVA (cerebral vascular accident) 02/05/2013  . Left leg weakness 02/05/2013  . Atrial fibrillation 02/05/2013  . Chronic anticoagulation 02/05/2013  . HTN (hypertension) 11/14/2012  . right brain stroke too small to be seen on MRI 08/02/2012  . Syncope 06/01/2012  . Right ureteral stone 09/15/2010  . Shoulder pain 08/19/2010  . Atrial flutter 07/17/2010  . Orthostatic hypotension 07/17/2010  . OSTEOPENIA 06/03/2009  . MIGRAINE HEADACHE 02/05/2008  . CATARACTS, BILATERAL 02/05/2008  . DECREASED HEARING 02/05/2008  . GEN OSTEOARTHROSIS INVOLVING MULTIPLE SITES 02/05/2008  . COLONIC POLYPS, HX OF 02/05/2008    Expected Discharge Date: Expected Discharge Date: 02/27/13  Team Members Present: Physician leading conference: Dr. Claudette Laws Social Worker Present: Staci Acosta, LCSW;Becky Josie Burleigh, LCSW Nurse Present: Other (comment) Doree Fudge Rosero-RN) PT Present: Wanda Plump, PT OT Present: Bretta Bang, OT SLP Present: Maxcine Ham, SLP PPS Coordinator present : [name] Duck, RN, CRRN     Current Status/Progress Goal Weekly Team Focus  Medical   Patient with fall distal clavicular fracture   Minimize impact of fracture  Pain management, maintain range of motion at the left elbow and hand and wrist   Bowel/Bladder   Continent of bowel and bladder. LBM 11/24. Wears pull-up.  Remain continent of bowel and bladder.  Monitor s/s of diarrhea and constipation.   Swallow/Nutrition/ Hydration     WFL        ADL's   supervision with self care, min assist with toilet and shower transfers, excellent return of LUE movement and strength  supervision overall including ADL transfers  ADL transfer training, pt and family education, LUE strengthening   Mobility   Min A transfers, min A gait with RW 50-100', Min A stairs x6   Supervision overall, but Min A stairs x15  activity tolerance, strengthening, balance (Berg was 22/56), stairs, and gait with RW - not wanting to buy another walker (has 4WW and hemi-walker)   Communication     [facility]        Safety/Cognition/ Behavioral Observations    No safety concerns        Pain   n/a  Pain level 3 or less on a scale of 0-10.  Monitor any onset of pain q shift.   Skin   Skin intact  No new skin breakdown/infection.  Monitor skin q shift.      *See Care Plan and progress notes for long and short-term goals.  Barriers to Discharge: See above    Possible Resolutions to Barriers:  Continued therapy, see above    Discharge Planning/Teaching Needs:  HOme with daughter's who plan to provide 24 hr care-here daily      Team Discussion:  Just fell in shower with OT.  X-rays, family to arrange 24 hour care. May not be feasible to do flight of stairs.  Revisions to Treatment Plan:  None   Continued Need for Acute Rehabilitation Level of Care: The patient requires daily medical management by a physician with specialized training in physical medicine and rehabilitation for the following conditions: Daily direction of a multidisciplinary physical rehabilitation program to ensure safe treatment while eliciting the highest outcome that is of practical value  to the patient.: Yes Daily medical management of patient stability for increased activity during participation in an intensive rehabilitation regime.: Yes Daily analysis of laboratory values and/or radiology reports with any subsequent need for medication adjustment of medical intervention for : Other;Neurological problems  Lucy Chris 02/23/2013, 12:59 PM

## 2013-02-21 NOTE — Consult Note (Signed)
ORTHOPAEDIC CONSULTATION  REQUESTING PHYSICIAN: Erick Colace, MD  Chief Complaint: Left distal clavicle fracture  HPI: [name] is a 79 y.o. female who complains of left shoulder pain s/p mechanical fall this am in the shower when she was up with the OT.  She denies LOC, neck pain, abd pain.  Denies motor/sensory changes in the LUE.  Past Medical History  Diagnosis Date  . Arthritis   . Hypertension   . Migraine   . Colon polyp   . Hearing difficulty   . Torus palatinus   . Atrial flutter January, 2012  . Stroke 08/02/12     right lenticular nucleus infarct  . Left leg weakness 02/05/2013  . Chronic anticoagulation   . High cholesterol    Past Surgical History  Procedure Laterality Date  . Cataract extraction w/ intraocular lens  implant, bilateral  2006-2008  . Ganglion cyst excision Bilateral 1938,1954,2003,2005    "wrists/hand" (08/01/2012)  . Cardioversion  05/19/2010    Dr. Jacinto Halim  . Tonsillectomy  ~ 1935  . Appendectomy  02/19/53    `   History   Social History  . Marital Status: Widowed    Spouse Name: N/A    Number of Children: 3  . Years of Education: college   Occupational History  . 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    Social History Main Topics  . Smoking status: Former Games developer  . Smokeless tobacco: Never Used  . Alcohol Use: 0.0 oz/week     Comment: 08/01/2012 "glass of wine on special occasions"  . Drug Use: No  . Sexual Activity: No   Other Topics Concern  . None   Social History Narrative  . None   Family History  Problem Relation Age of Onset  . Colon cancer    . Breast cancer    . Brain cancer    . Cancer Mother     breast  . Aneurysm Mother     brain  . Heart attack Neg Hx   . Diabetes Neg Hx   . Hypertension Neg Hx    Allergies  Allergen Reactions  . Tramadol Hcl Other (See Comments)     lethargy, nausea  . Alendronate Sodium Rash and Other (See Comments)    puffy eyes   Prior to Admission medications     Medication Sig Start Date End Date Taking? Authorizing Provider  amLODipine (NORVASC) 2.5 MG tablet Take 2.5-5 mg by mouth daily as needed. Takes 1 tablet if SBP >150; 2 tablets if SPB . 175 or DBP > 100.   Yes Historical Provider, MD  aspirin-acetaminophen-caffeine (EXCEDRIN MIGRAINE) [phone] MG per tablet Take 2 tablets by mouth daily as needed for pain (for migraines).   Yes Historical Provider, MD  atorvastatin (LIPITOR) 40 MG tablet Take 1 tablet (40 mg total) by mouth daily at 6 PM. 02/07/13  Yes Penny Pia, MD  divalproex (DEPAKOTE ER) 250 MG 24 hr tablet Take 1 tablet (250 mg total) by mouth daily. 02/15/13  Yes Maryann Mikhail, DO  Multiple Vitamin (MULTIVITAMIN WITH MINERALS) TABS Take 1 tablet by mouth daily.   Yes Historical Provider, MD  Rivaroxaban (XARELTO) 15 MG TABS tablet Take 1 tablet (15 mg total) by mouth daily with supper. 08/03/12  Yes Ripudeep Jenna Luo, MD   Dg Chest 1 View  02/21/2013   CLINICAL DATA:  Status post fall with left shoulder and chest discomfort  EXAM: CHEST - 1 VIEW  COMPARISON:  Chest x-ray dated February 05, 2013.  FINDINGS: The lungs remain hyperinflated. There is no evidence of a pulmonary contusion or pneumothorax or pneumomediastinum. The cardiac silhouette is normal in size. The pulmonary vascularity is not engorged. The trachea is midline. The observed portions of the bony thorax exhibit no acute abnormalities. Only portions of the left shoulder are included in the field of view. The visualized portions appear normal.  IMPRESSION: 1. No acute abnormality of the visualized portions of the bony thorax is demonstrated. 2. Mild stable hyperinflation is consistent with COPD. There is no evidence of a pulmonary contusion or pneumothorax.   Electronically Signed   By: David  [place]   On: 02/21/2013 12:54   Dg Clavicle Left  02/21/2013   CLINICAL DATA:  Status post fall.  Pain.  EXAM: LEFT CLAVICLE - 2+ VIEWS  COMPARISON:  Plain films left shoulder 02/21/2013.   FINDINGS: The patient has a mildly comminuted fracture of the distal clavicle. The fracture is approximately 3.3 cm medial to the [facility] joint with approximately 1/2 shaft width inferior displacement of the distal fragment. The [facility] joint is intact. Imaged left lung and ribs are clear.  IMPRESSION: Acute distal left clavicle fracture as described.   Electronically Signed   By: Drusilla Kanner M.D.   On: 02/21/2013 15:47   Ct Head Wo Contrast  02/21/2013   CLINICAL DATA:  Fall.  EXAM: CT HEAD WITHOUT CONTRAST  TECHNIQUE: Contiguous axial images were obtained from the base of the skull through the vertex without intravenous contrast.  COMPARISON:  MRI 02/12/2013.  Head CT 02/12/2013.  FINDINGS: No mass. No hydrocephalus. No hemorrhage. Chronic white matter periventricular changes are noted. Old infarct in the right corona radiata noted. Orbits are unremarkable. Paranasal sinuses where visualize are normal. Soft tissue swelling is noted over the left frontoparietal region. No evidence fracture.  IMPRESSION: No significant focal abnormality. Chronic ischemic change present. CT stable from 02/12/2013.   Electronically Signed   By: Maisie Fus  Register   On: 02/21/2013 13:54   Dg Shoulder Left  02/21/2013   CLINICAL DATA:  Pain status post fall.  EXAM: LEFT SHOULDER - 2+ VIEW  COMPARISON:  None.  FINDINGS: Two views of the left shoulder are submitted. The [facility] joint is not well evaluated by an acute fracture is suspected. The glenohumeral joint appears normal. The bones exhibit mild osteopenia.  IMPRESSION: The findings are worrisome for an acute fracture through the distal aspect of the clavicular shaft just proximal to the [facility] joint.  These results were called by telephone at the time of interpretation on 02/21/2013 at 12:59 PM to Ms. Albin Felling at [facility].   Electronically Signed   By: David  [place]   On: 02/21/2013 13:00    Positive ROS: All other systems have been reviewed and were otherwise negative with the exception  of those mentioned in the HPI and as above.  Physical Exam: General: Alert, no acute distress Cardiovascular: No pedal edema Respiratory: No cyanosis, no use of accessory musculature GI: No organomegaly, abdomen is soft and non-tender Skin: No lesions in the area of chief complaint Neurologic: Sensation intact distally Psychiatric: Patient is competent for consent with normal mood and affect Lymphatic: No axillary or cervical lymphadenopathy  MUSCULOSKELETAL:  LUE: skin intact, no signs of tenting or impending compromise. NVI distally.  Assessment: Left distal clavicle fracture  Plan: 1. NWB LUE 2. Sling LUE 3. Plan to treat this nonop 4. F/u 3 weeks in office   Thank you for the consult and the  opportunity to see Ms. Vianne Bulls Glee Arvin, MD [facility] [phone] 5:06 PM

## 2013-02-21 NOTE — Progress Notes (Signed)
Occupational Therapy Session Note  Patient Details  Name: [name] MRN: [id number] Date of Birth: [birth year]  Today's Date: 02/21/2013 Time: 1010-1038 Time Calculation (min): 28 min  Short Term Goals: Week 1:  OT Short Term Goal 1 (Week 1): Pt will demonstrate improved standing balance to stand with min assist as she pulls pants up/down hips. OT Short Term Goal 2 (Week 1): Pt will be able to transfer to elevated toilet seat with min assist from w/c. OT Short Term Goal 3 (Week 1): Pt will be able to transfer to shower seat with grab bars with min assist. OT Short Term Goal 4 (Week 1): Pt's LUE strength will improve from a 3+/5 to a 4/5 to enable her to use her arm more actively with transfers.  Skilled Therapeutic Interventions/Progress Updates:      Pt seen for BADL retraining of toileting, bathing, and dressing with a focus on standing balance, active use of LUE, and overall activity tolerance. Pt was able to get OOB, ambulate to toilet, doff LB clothing, toilet,and ambulate to shower with RW all with supervision.  Pt sat on shower seat to shower and then stood with therapist next to her. Her left foot slipped in as she leaned to left as she was rinsing off the soap.  She began to fall to left out of shower. This clinician assisted a fall to the floor by supporting her around waist/trunk. As she was descending to floor, her head and shoulder hit the floor. Nursing called immediately and PA/ MD notified.  Pt had to go to xray to assess shoulder.  Spoke with nurse, patient, PA and pt's daughter.  Pt was resting in w/c with L arm supported by pillow.    Therapy Documentation Precautions:  Precautions Precautions: Fall Restrictions Weight Bearing Restrictions: No  Pain: pt had no pain at start of session.   ADL:  See FIM for current functional status  Therapy/Group: Individual Therapy  Caylie Sandquist 02/21/2013, 12:38 PM

## 2013-02-21 NOTE — Progress Notes (Signed)
Physical Therapy Session Note  Patient Details  Name: [name] MRN: [id number] Date of Birth: [birth year]  Today's Date: 02/21/2013 Time: 4098-1191 Time Calculation (min): 17 min  Short Term Goals: Week 1:  PT Short Term Goal 1 (Week 1): Pt will perform bed mobiltiy with Mod A PT Short Term Goal 2 (Week 1): Pt will perform bed<>chair transfers with min A PT Short Term Goal 3 (Week 1): Pt will propel WC x150 with S PT Short Term Goal 4 (Week 1): Pt will perform gait with RW x50' with Min A PT Short Term Goal 5 (Week 1): Pt will ascend/descend 5 steps with bil Rails and Min A  Skilled Therapeutic Interventions/Progress Updates:    AM Session: No c/o pain. Patient received supine in bed, daughter present. Patient agreeable to therapy until breakfast arrives. Patient transfers supine>sit with HOB elevated and supervision. Seated edge of bed, patient dons shoes. Patient performs x2 sit<>stand transfers from edge of bed with minA, then breakfast arrives. Patient stating she would like to eat. Patient sit>supine with HOB elevated and supervision to eat breakfast. Patient left sitting upright in bed to eat breakfast with daughter present; all needs within reach.   PM Session: Patient missed 43 minutes of skilled physical therapy this PM secondary to refusal to participate secondary to c/o s/p fall this AM. Additionally, patient awaiting additional x-rays. Patient left supine in bed with all needs within reach and 2 daughters present.  Therapy Documentation Precautions:  Precautions Precautions: Fall Restrictions Weight Bearing Restrictions: No General: Amount of Missed PT Time (min): 43 Minutes Missed Time Reason: X-Ray (awaiting transport for additional x rays)  See FIM for current functional status  Therapy/Group: Individual Therapy  Chipper Herb. Anastazja Isaac, PT, DPT  02/21/2013, 3:00 PM

## 2013-02-21 NOTE — Progress Notes (Signed)
Received a call from radiologist with the xray report for left distal clavicular fracture.Dan PA was notified about the results,keep monitoring pt. Closely and assessing her needs

## 2013-02-21 NOTE — Progress Notes (Signed)
[name] is a 79 y.o. 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 Admitted 02/12/2013 with new onset of left facial droop as well as left-sided weakness. Recent admit 02/05/2013 for recurrent left-sided weakness with MRI showing expected evolution of right corona radiata lentiform nuclei infarct which did occur in May no new acute abnormalities. Followup MRI 02/12/2013 again showing no evidence of acute infarct. Patient did not receive TPA. EEG negative for seizure. MRI lumbar spine showed no unilateral abnormality to explain left hemiplegia. Neurology followup patient remains on Xarelto for CVA prophylaxis workup indicated small subcortical infarct small vessel involvement not seen on MRI. Noted intermittent bouts of headache placed on low-dose Depakote Subjective/Complaints: Fall in shower  Objective: Vital Signs: Blood pressure 124/79, pulse 89, temperature 97.6 F (36.4 C), temperature source Oral, resp. rate 17, height 5\' 7"  (1.702 m), weight 55.747 kg (122 lb 14.4 oz), SpO2 96.00%. Dg Chest 1 View  02/21/2013   CLINICAL DATA:  Status post fall with left shoulder and chest discomfort  EXAM: CHEST - 1 VIEW  COMPARISON:  Chest x-ray dated February 05, 2013.  FINDINGS: The lungs remain hyperinflated. There is no evidence of a pulmonary contusion or pneumothorax or pneumomediastinum. The cardiac silhouette is normal in size. The pulmonary vascularity is not engorged. The trachea is midline. The observed portions of the bony thorax exhibit no acute abnormalities. Only portions of the left shoulder are included in the field of view. The visualized portions appear normal.  IMPRESSION: 1. No acute abnormality of the visualized portions of the bony thorax is demonstrated. 2. Mild stable hyperinflation is consistent with COPD. There is no evidence of a pulmonary contusion or pneumothorax.    Electronically Signed   By: David  [place]   On: 02/21/2013 12:54   Dg Clavicle Left  02/21/2013   CLINICAL DATA:  Status post fall.  Pain.  EXAM: LEFT CLAVICLE - 2+ VIEWS  COMPARISON:  Plain films left shoulder 02/21/2013.  FINDINGS: The patient has a mildly comminuted fracture of the distal clavicle. The fracture is approximately 3.3 cm medial to the [facility] joint with approximately 1/2 shaft width inferior displacement of the distal fragment. The [facility] joint is intact. Imaged left lung and ribs are clear.  IMPRESSION: Acute distal left clavicle fracture as described.   Electronically Signed   By: Drusilla Kanner M.D.   On: 02/21/2013 15:47   Ct Head Wo Contrast  02/21/2013   CLINICAL DATA:  Fall.  EXAM: CT HEAD WITHOUT CONTRAST  TECHNIQUE: Contiguous axial images were obtained from the base of the skull through the vertex without intravenous contrast.  COMPARISON:  MRI 02/12/2013.  Head CT 02/12/2013.  FINDINGS: No mass. No hydrocephalus. No hemorrhage. Chronic white matter periventricular changes are noted. Old infarct in the right corona radiata noted. Orbits are unremarkable. Paranasal sinuses where visualize are normal. Soft tissue swelling is noted over the left frontoparietal region. No evidence fracture.  IMPRESSION: No significant focal abnormality. Chronic ischemic change present. CT stable from 02/12/2013.   Electronically Signed   By: Maisie Fus  Register   On: 02/21/2013 13:54   Dg Shoulder Left  02/21/2013   CLINICAL DATA:  Pain status post fall.  EXAM: LEFT SHOULDER - 2+ VIEW  COMPARISON:  None.  FINDINGS: Two views of the left shoulder are submitted. The [facility] joint is not well evaluated by an acute fracture is suspected. The glenohumeral joint appears normal.  The bones exhibit mild osteopenia.  IMPRESSION: The findings are worrisome for an acute fracture through the distal aspect of the clavicular shaft just proximal to the [facility] joint.  These results were called by telephone at the time of  interpretation on 02/21/2013 at 12:59 PM to Ms. Albin Felling at [facility].   Electronically Signed   By: David  [place]   On: 02/21/2013 13:00   No results found for this or any previous visit (from the past 72 hour(s)).   HEENT: normal Cardio: irregular Resp: CTA B/L and unlabored GI: BS positive and NT,ND Extremity:  Pulses positive and No Edema Skin:   Intact Neuro: Alert/Oriented, Cranial Nerve II-XII normal, Abnormal Sensory reduced LT on L side, Abnormal Motor 5/5 on R and 4-/5 LUE, 3-/5 Left HF , KE, ankle DF/PF, Abnormal FMC Ataxic/ dec FMC and Tone:  Within Normal Limits Musc/Skel:  Normal GEN NAD   Assessment/Plan: 1. Functional deficits secondary to Right subcortical infarct with LLE>>LUE weakness which require 3+ hours per day of interdisciplinary therapy in a comprehensive inpatient rehab setting. Physiatrist is providing close team supervision and 24 hour management of active medical problems listed below. Physiatrist and rehab team continue to assess barriers to discharge/monitor patient progress toward functional and medical goals. FIM: FIM - Bathing Bathing Steps Patient Completed: Chest;Right Arm;Left Arm;Abdomen;Front perineal area;Buttocks;Right upper leg;Left upper leg;Right lower leg (including foot);Left lower leg (including foot) Bathing: 5: Supervision: Safety issues/verbal cues  FIM - Upper Body Dressing/Undressing Upper body dressing/undressing steps patient completed: Thread/unthread right sleeve of pullover shirt/dresss;Thread/unthread left sleeve of pullover shirt/dress;Put head through opening of pull over shirt/dress;Pull shirt over trunk Upper body dressing/undressing: 5: Set-up assist to: Obtain clothing/put away FIM - Lower Body Dressing/Undressing Lower body dressing/undressing steps patient completed: Thread/unthread right underwear leg;Thread/unthread left underwear leg;Thread/unthread right pants leg;Thread/unthread left pants leg;Pull pants  up/down;Don/Doff right shoe;Don/Doff left shoe;Pull underwear up/down Lower body dressing/undressing: 5: Set-up assist to: Don/Doff TED stocking  FIM - Toileting Toileting steps completed by patient: Adjust clothing prior to toileting;Performs perineal hygiene;Adjust clothing after toileting Toileting Assistive Devices: Grab bar or rail for support Toileting: 5: Supervision: Safety issues/verbal cues  FIM - Diplomatic Services operational officer Devices: Walker;Elevated toilet seat Toilet Transfers: 4-From toilet/BSC: Min A (steadying Pt. > 75%);4-To toilet/BSC: Min A (steadying Pt. > 75%)  FIM - Bed/Chair Transfer Bed/Chair Transfer Assistive Devices: Arm rests Bed/Chair Transfer: 4: Bed > Chair or W/C: Min A (steadying Pt. > 75%);4: Chair or W/C > Bed: Min A (steadying Pt. > 75%);4: Sit > Supine: Min A (steadying pt. > 75%/lift 1 leg)  FIM - Locomotion: Wheelchair Distance: 40 Locomotion: Wheelchair: 1: Total Assistance/staff pushes wheelchair (Pt<25%) FIM - Locomotion: Ambulation Locomotion: Ambulation Assistive Devices: Designer, industrial/product Ambulation/Gait Assistance: 4: Min assist Locomotion: Ambulation: 2: Travels 50 - 149 ft with minimal assistance (Pt.>75%)  Comprehension Comprehension Mode: Auditory Comprehension: 6-Follows complex conversation/direction: With extra time/assistive device  Expression Expression Mode: Verbal Expression: 6-Expresses complex ideas: With extra time/assistive device  Social Interaction Social Interaction: 7-Interacts appropriately with others - No medications needed.  Problem Solving Problem Solving: 6-Solves complex problems: With extra time  Memory Memory: 6-More than reasonable amt of time  Medical Problem List and Plan:  1. Suspect small subcortical CVA not identified on MRI. Recent right lenticular nucleus infarct May 2014 with little residual  2. DVT Prophylaxis/Anticoagulation: Xarelto. Monitor for any bleeding episodes  3. Pain  Management/headaches. Continue low-dose Depakote.  4. Neuropsych: This patient is capable of making decisions  on her own behalf.  5. Hypertension. Norvasc 2.5 mg daily. Monitor with increased mobility  6. Hyperlipidemia. Lipitor  7. Rate Controlled A Fib 8.  Clavicle fx consult ortho LOS (Days) 6 A FACE TO FACE EVALUATION WAS PERFORMED  KIRSTEINS,ANDREW E 02/21/2013, 3:58 PM

## 2013-02-21 NOTE — Progress Notes (Signed)
X-rays return to left shoulder show acute fractures of the distal aspect of the clavicular shaft just proximal to the a.c. joint. Orthopedic services Dr.XU consulted [phone] left the message in reference to followup and recommendations. All issues were discussed with patient and family

## 2013-02-22 NOTE — Progress Notes (Signed)
[name] is a 79 y.o. 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 Admitted 02/12/2013 with new onset of left facial droop as well as left-sided weakness. Recent admit 02/05/2013 for recurrent left-sided weakness with MRI showing expected evolution of right corona radiata lentiform nuclei infarct which did occur in May no new acute abnormalities. Followup MRI 02/12/2013 again showing no evidence of acute infarct. Patient did not receive TPA. EEG negative for seizure. MRI lumbar spine showed no unilateral abnormality to explain left hemiplegia. Neurology followup patient remains on Xarelto for CVA prophylaxis workup indicated small subcortical infarct small vessel involvement not seen on MRI. Noted intermittent bouts of headache placed on low-dose Depakote Subjective/Complaints: Left clavicle her pain improved with Tylenol #3. Also in a sling. Is not using her hand very much however I encouraged her to open and close her left hand  Objective: Vital Signs: Blood pressure 151/76, pulse 67, temperature 97.6 F (36.4 C), temperature source Oral, resp. rate 18, height 5\' 7"  (1.702 m), weight 55.747 kg (122 lb 14.4 oz), SpO2 98.00%. Dg Chest 1 View  02/21/2013   CLINICAL DATA:  Status post fall with left shoulder and chest discomfort  EXAM: CHEST - 1 VIEW  COMPARISON:  Chest x-ray dated February 05, 2013.  FINDINGS: The lungs remain hyperinflated. There is no evidence of a pulmonary contusion or pneumothorax or pneumomediastinum. The cardiac silhouette is normal in size. The pulmonary vascularity is not engorged. The trachea is midline. The observed portions of the bony thorax exhibit no acute abnormalities. Only portions of the left shoulder are included in the field of view. The visualized portions appear normal.  IMPRESSION: 1. No acute abnormality of the visualized portions of the bony  thorax is demonstrated. 2. Mild stable hyperinflation is consistent with COPD. There is no evidence of a pulmonary contusion or pneumothorax.   Electronically Signed   By: David  [place]   On: 02/21/2013 12:54   Dg Clavicle Left  02/21/2013   CLINICAL DATA:  Status post fall.  Pain.  EXAM: LEFT CLAVICLE - 2+ VIEWS  COMPARISON:  Plain films left shoulder 02/21/2013.  FINDINGS: The patient has a mildly comminuted fracture of the distal clavicle. The fracture is approximately 3.3 cm medial to the [facility] joint with approximately 1/2 shaft width inferior displacement of the distal fragment. The [facility] joint is intact. Imaged left lung and ribs are clear.  IMPRESSION: Acute distal left clavicle fracture as described.   Electronically Signed   By: Drusilla Kanner M.D.   On: 02/21/2013 15:47   Ct Head Wo Contrast  02/21/2013   CLINICAL DATA:  Fall.  EXAM: CT HEAD WITHOUT CONTRAST  TECHNIQUE: Contiguous axial images were obtained from the base of the skull through the vertex without intravenous contrast.  COMPARISON:  MRI 02/12/2013.  Head CT 02/12/2013.  FINDINGS: No mass. No hydrocephalus. No hemorrhage. Chronic white matter periventricular changes are noted. Old infarct in the right corona radiata noted. Orbits are unremarkable. Paranasal sinuses where visualize are normal. Soft tissue swelling is noted over the left frontoparietal region. No evidence fracture.  IMPRESSION: No significant focal abnormality. Chronic ischemic change present. CT stable from 02/12/2013.   Electronically Signed   By: Maisie Fus  Register   On: 02/21/2013 13:54   Dg Shoulder Left  02/21/2013   CLINICAL DATA:  Pain status post fall.  EXAM: LEFT SHOULDER - 2+ VIEW  COMPARISON:  None.  FINDINGS: Two views of the left shoulder are submitted. The [facility] joint is not well evaluated by an acute fracture is suspected. The glenohumeral joint appears normal. The bones exhibit mild osteopenia.  IMPRESSION: The findings are worrisome for an acute fracture  through the distal aspect of the clavicular shaft just proximal to the [facility] joint.  These results were called by telephone at the time of interpretation on 02/21/2013 at 12:59 PM to Ms. Albin Felling at [facility].   Electronically Signed   By: David  [place]   On: 02/21/2013 13:00   No results found for this or any previous visit (from the past 72 hour(s)).   HEENT: normal Cardio: irregular Resp: CTA B/L and unlabored GI: BS positive and NT,ND Extremity:  Pulses positive and No Edema Skin:   Intact Neuro: Alert/Oriented, Cranial Nerve II-XII normal, Abnormal Sensory reduced LT on L side, Abnormal Motor 5/5 on R and 4-/5 LUE, 3-/5 Left HF , KE, ankle DF/PF, Abnormal FMC Ataxic/ dec FMC and Tone:  Within Normal Limits Musc/Skel:  Normal GEN NAD   Assessment/Plan: 1. Functional deficits secondary to Right subcortical infarct with LLE>>LUE weakness which require 3+ hours per day of interdisciplinary therapy in a comprehensive inpatient rehab setting. Physiatrist is providing close team supervision and 24 hour management of active medical problems listed below. Physiatrist and rehab team continue to assess barriers to discharge/monitor patient progress toward functional and medical goals. FIM: FIM - Bathing Bathing Steps Patient Completed: Chest;Right Arm;Left Arm;Abdomen;Front perineal area;Buttocks;Right upper leg;Left upper leg;Right lower leg (including foot);Left lower leg (including foot) Bathing: 5: Supervision: Safety issues/verbal cues  FIM - Upper Body Dressing/Undressing Upper body dressing/undressing steps patient completed: Thread/unthread right sleeve of pullover shirt/dresss;Thread/unthread left sleeve of pullover shirt/dress;Put head through opening of pull over shirt/dress;Pull shirt over trunk Upper body dressing/undressing: 5: Set-up assist to: Obtain clothing/put away FIM - Lower Body Dressing/Undressing Lower body dressing/undressing steps patient completed: Thread/unthread right  underwear leg;Thread/unthread left underwear leg;Thread/unthread right pants leg;Thread/unthread left pants leg;Pull pants up/down;Don/Doff right shoe;Don/Doff left shoe;Pull underwear up/down Lower body dressing/undressing: 5: Set-up assist to: Don/Doff TED stocking  FIM - Toileting Toileting steps completed by patient: Adjust clothing prior to toileting;Performs perineal hygiene;Adjust clothing after toileting Toileting Assistive Devices: Grab bar or rail for support Toileting: 5: Supervision: Safety issues/verbal cues  FIM - Diplomatic Services operational officer Devices: Walker;Elevated toilet seat Toilet Transfers: 4-From toilet/BSC: Min A (steadying Pt. > 75%);4-To toilet/BSC: Min A (steadying Pt. > 75%)  FIM - Bed/Chair Transfer Bed/Chair Transfer Assistive Devices: Arm rests Bed/Chair Transfer: 4: Bed > Chair or W/C: Min A (steadying Pt. > 75%);4: Chair or W/C > Bed: Min A (steadying Pt. > 75%);4: Sit > Supine: Min A (steadying pt. > 75%/lift 1 leg)  FIM - Locomotion: Wheelchair Distance: 40 Locomotion: Wheelchair: 1: Total Assistance/staff pushes wheelchair (Pt<25%) FIM - Locomotion: Ambulation Locomotion: Ambulation Assistive Devices: Designer, industrial/product Ambulation/Gait Assistance: 4: Min assist Locomotion: Ambulation: 2: Travels 50 - 149 ft with minimal assistance (Pt.>75%)  Comprehension Comprehension Mode: Auditory Comprehension: 6-Follows complex conversation/direction: With extra time/assistive device  Expression Expression Mode: Verbal Expression: 6-Expresses complex ideas: With extra time/assistive device  Social Interaction Social Interaction Mode: Asleep Social Interaction: 7-Interacts appropriately with others - No medications needed.  Problem Solving Problem Solving: 6-Solves complex problems: With extra time  Memory Memory: 6-More than reasonable amt of time  Medical Problem List and Plan:  1. Suspect small subcortical CVA not identified on MRI.  Recent right lenticular nucleus infarct May  2014 with little residual  2. DVT Prophylaxis/Anticoagulation: Xarelto. Monitor for any bleeding episodes  3. Pain Management/headaches. Continue low-dose Depakote.  4. Neuropsych: This patient is capable of making decisions on her own behalf.  5. Hypertension. Norvasc 2.5 mg daily. Monitor with increased mobility  6. Hyperlipidemia. Lipitor  7. Rate Controlled A Fib 8.  Clavicle fx per ortho, sling and nonweightbearing, followup in the office 3 weeks. I'll also ask her to use her hand as much as possible but not bear weight LOS (Days) 7 A FACE TO FACE EVALUATION WAS PERFORMED  KIRSTEINS,ANDREW E 02/22/2013, 9:06 AM

## 2013-02-23 ENCOUNTER — Inpatient Hospital Stay (HOSPITAL_COMMUNITY): Payer: Medicare Other | Admitting: Occupational Therapy

## 2013-02-23 ENCOUNTER — Inpatient Hospital Stay (HOSPITAL_COMMUNITY): Payer: Medicare Other | Admitting: *Deleted

## 2013-02-23 NOTE — Progress Notes (Signed)
Social Work Patient ID: [name], female   DOB: 18-Jun-1924, 79 y.o.   MRN: [id number] Met with pt and daughter who reports her function is less now arm issue.  She requires more assist and will need to figure out how to get her into the home. Cole-PT is working on this.  Will need to extend her stay since discharge date set prior to pt's fall.  Daughter's are very committed but need to be able to manage her At home.  Aware of the 24 hour care recommended and the stair issue.  Will work with family on discharge needs.

## 2013-02-23 NOTE — Progress Notes (Signed)
Physical Therapy Weekly Progress Note  Patient Details  Name: [name] MRN: [id number] Date of Birth: [birth year]  Today's Date: 02/23/2013 Time: 8:05-9:00 ( ) and 14:05-14:45 ( )   Prior to 11/26, pt had met 4/5 short term goals, making great progress towards LTG, however following fall in shower with L clavicle fracture, pt has now met 1/5 short term goals as of today, largely due to pain limitations. Pt had been making great progress with balance, strength, and activity tolerance.   Patient continues to demonstrate the following deficits: dynamic balance, coordination, and strength and therefore will continue to benefit from skilled PT intervention to enhance overall performance with activity tolerance, balance, functional use of  left lower extremity and coordination.  Patient not progressing toward long term goals.  See goal revision..  Plan of care revisions: adjusting levels of assist needed due to current functional status. .  PT Short Term Goals Week 1:  PT Short Term Goal 1 (Week 1): Pt will perform bed mobiltiy with Mod A PT Short Term Goal 1 - Progress (Week 1): Partly met (Pt had met this goal as of 11/26, but now Max A) PT Short Term Goal 2 (Week 1): Pt will perform bed<>chair transfers with min A PT Short Term Goal 2 - Progress (Week 1): Met PT Short Term Goal 3 (Week 1): Pt will propel WC x150 with S PT Short Term Goal 3 - Progress (Week 1): Not met PT Short Term Goal 4 (Week 1): Pt will perform gait with RW x50' with Min A PT Short Term Goal 4 - Progress (Week 1): Partly met (Pt had met as of 11/26, but need to reassess) PT Short Term Goal 5 (Week 1): Pt will ascend/descend 5 steps with bil Rails and Min A PT Short Term Goal 5 - Progress (Week 1): Partly met (Met as of 11/26, need to reassess) Week 2:  PT Short Term Goal 1 (Week 2): Pt will perform bed mobility with Mod A PT Short Term Goal 2 (Week 2): Pt will perform bed<>chair transfers with S PT Short Term  Goal 3 (Week 2): Pt will ambulate 52' with Mod A and LRAD PT Short Term Goal 4 (Week 2): Pt will perform standing activity x22min with supervision PT Short Term Goal 5 (Week 2): Pt will ascend/descend 3 stairs with 1 rail and min A  Skilled Therapeutic Interventions/Progress Updates:  1:2 Pt resting in bed, with 10/10 clavical pain from fx when moving. Pt has not been taking pain meds, and was encouraged to do so for increased participation in therapy. Pt moving slowly, guarded, and educated on importance of continuing to find comfortable ways of moving.   Supine therex for LE strengthening and NMR including each of the following bil x20: ankle pumps, heel slides, hip ABD, SAQ with 5 sec holds. LAQ in sitting 2x10 with 5 sec holds.   Sit<>supine with Max A due to pain.   Seated EOB x41min with 1 UE support.   Sit<>stand with Min A to hemi-walker x20 sec, then need to sit. Stand-step transfer with hemi-walker with min A for steadying and cues for LLE lifting. Attempted standing again in // bars, but pt unable due to pain.   Returned to room WC>bed with Mod A and sit>supine with Max A for trunk and LEs.   2:2 Pt resting in bed with daughter present, feeling better overall. Tx focused on gait with hemi-walker, stairs, NMR and transfers.  Supine>sit through sidelying with Mod A. Stand-step transfers  with hemi-walker x4 throughout tx with Min A and cues for completing turn and picking up LLE.   Gait on handrail in hall x25' with Min A, then 20' with hemi-walker after demonstration. Pt needing cues for optimal placement and increasing L step length.   Kinetron x seated from [facility] for increased L step coordination.   Stairs x3 with Min A and 1 rail with cues for sequence.  Pt left up in recliner with daughter present, reporting they will be installing chair lift on stairs.       Therapy Documentation Precautions:  Precautions Precautions: Fall Precaution Comments: NWB of LUE, sling to  LUE Restrictions Weight Bearing Restrictions: No    Pain: 10/10 L sholder pain, nursing made aware and brought pain meds, repositioned and modified tx prn as well.   See FIM for current functional status  Therapy/Group: Individual Therapy  Clydene Laming, PT, DPT  02/23/2013, 1:08 PM

## 2013-02-23 NOTE — Progress Notes (Signed)
Occupational Therapy Session Note  Patient Details  Name: [name] MRN: [id number] Date of Birth: [birth year]  Today's Date: 02/23/2013 Time: 1300-1330 Time Calculation (min): 30 min  OT Short Term Goals Week 2:  OT Short Term Goal 1 (Week 2): Pt will be able to transfer to and from [facility] and or toilet with min assist. OT Short Term Goal 2 (Week 2): Pt will be able to don shirt with min assist to doff over L arm. OT Short Term Goal 3 (Week 2): Pt will be able to don underwear and pants with min assist. OT Short Term Goal 4 (Week 2): Pt will be able to bathe self with min assist.   Skilled Therapeutic Interventions/Progress Updates:  Upon arrival, patient resting in bed with daughter at her side and LUE sling in place.  Engaged patient in bed mobility, education to doff and donn LUE sling, LUE AROM distal to shoulder only, sit><stand at EOB and discussion regarding bedroom arrangement changes that will allow patient to exit and enter the bed using her RUE to reduce pain.  Patient fearful of movement due to pain yet with vc, physical assistance and encouragement, patient rolled to her right side, then sidely to sit.  Daughter observed doffing and donning of sling and patient performed pain-free and slow AROM distal to the shoulder when arm out of sling.  RN notified that sling is too small as her hand hangs out.  RN will order one size larger.  Patient performed sit><stand with HHA with RUE and asked to sit back down after 30 seconds due to "tired".  Assisted patient back to bed with pillow positioned behind left arm/shoulder for support.  Therapy Documentation Precautions:  Precautions Precautions: Fall Precaution Comments: NWB of LUE, sling to LUE Restrictions Weight Bearing Restrictions: No Pain: 0/10 "if I'm not moving", 10/10 with activity. RN notified and medication provided.  Also rest and repositioned. Therapy/Group: Individual Therapy  Judy Pollman 02/23/2013, 1:51 PM

## 2013-02-23 NOTE — Progress Notes (Signed)
Social Work Lucy Chris, LCSW Social Worker Signed  Patient Care Conference Service date: 02/21/2013 2:13 PM  Inpatient RehabilitationTeam Conference and Plan of Care Update Date: 02/21/2013   Time: 11:45 Am     Patient Name: [name]       Medical Record Number: [id number]   Date of Birth: [birth year] Sex: Female         Room/Bed: 4M05C/4M05C-01 Payor Info: Payor: MEDICARE / Plan: MEDICARE PART A AND B / Product Type: *No Product type* /   Admitting Diagnosis: R CVA   Admit Date/Time:  02/15/2013  6:11 PM Admission Comments: No comment available   Primary Diagnosis:  Subcortical infarction Principal Problem: Subcortical infarction    Patient Active Problem List     Diagnosis  Date Noted   .  Subcortical infarction  02/16/2013   .  Possible Seizures  02/14/2013   .  TIA (transient ischemic attack)  02/12/2013   .  Stroke-like symptom  02/12/2013   .  Hyperglycemia  02/12/2013   .  High cholesterol     .  CVA (cerebral vascular accident)  02/05/2013   .  Left leg weakness  02/05/2013   .  Atrial fibrillation  02/05/2013   .  Chronic anticoagulation  02/05/2013   .  HTN (hypertension)  11/14/2012   .  right brain stroke too small to be seen on MRI  08/02/2012   .  Syncope  06/01/2012   .  Right ureteral stone  09/15/2010   .  Shoulder pain  08/19/2010   .  Atrial flutter  07/17/2010   .  Orthostatic hypotension  07/17/2010   .  OSTEOPENIA  06/03/2009   .  MIGRAINE HEADACHE  02/05/2008   .  CATARACTS, BILATERAL  02/05/2008   .  DECREASED HEARING  02/05/2008   .  GEN OSTEOARTHROSIS INVOLVING MULTIPLE SITES  02/05/2008   .  COLONIC POLYPS, HX OF  02/05/2008     Expected Discharge Date: Expected Discharge Date: 02/27/13  Team Members Present: Physician leading conference: Dr. Claudette Laws Social Worker Present: Staci Acosta, LCSW;Becky Chester Romero, LCSW Nurse Present: Other (comment) Doree Fudge Rosero-RN) PT Present: Wanda Plump, PT OT Present: Bretta Bang, OT SLP  Present: Maxcine Ham, SLP PPS Coordinator present : [name] Duck, RN, CRRN        Current Status/Progress  Goal  Weekly Team Focus   Medical     Patient with fall distal clavicular fracture  Minimize impact of fracture  Pain management, maintain range of motion at the left elbow and hand and wrist   Bowel/Bladder     Continent of bowel and bladder. LBM 11/24. Wears pull-up.  Remain continent of bowel and bladder.  Monitor s/s of diarrhea and constipation.   Swallow/Nutrition/ Hydration     WFL       ADL's     supervision with self care, min assist with toilet and shower transfers, excellent return of LUE movement and strength  supervision overall including ADL transfers  ADL transfer training, pt and family education, LUE strengthening   Mobility     Min A transfers, min A gait with RW 50-100', Min A stairs x6   Supervision overall, but Min A stairs x15  activity tolerance, strengthening, balance (Berg was 22/56), stairs, and gait with RW - not wanting to buy another walker (has 4WW and hemi-walker)   Communication     [facility]       Safety/Cognition/ Behavioral Observations    No  safety concerns       Pain     n/a  Pain level 3 or less on a scale of 0-10.  Monitor any onset of pain q shift.   Skin     Skin intact  No new skin breakdown/infection.  Monitor skin q shift.     *See Care Plan and progress notes for long and short-term goals.    Barriers to Discharge:  See above      Possible Resolutions to Barriers:    Continued therapy, see above      Discharge Planning/Teaching Needs:    HOme with daughter's who plan to provide 24 hr care-here daily      Team Discussion:    Just fell in shower with OT.  X-rays, family to arrange 24 hour care. May not be feasible to do flight of stairs.   Revisions to Treatment Plan:    None    Continued Need for Acute Rehabilitation Level of Care: The patient requires daily medical management by a physician with specialized training in  physical medicine and rehabilitation for the following conditions: Daily direction of a multidisciplinary physical rehabilitation program to ensure safe treatment while eliciting the highest outcome that is of practical value to the patient.: Yes Daily medical management of patient stability for increased activity during participation in an intensive rehabilitation regime.: Yes Daily analysis of laboratory values and/or radiology reports with any subsequent need for medication adjustment of medical intervention for : Other;Neurological problems  Lucy Chris 02/23/2013, 12:59 PM          Patient ID: [name], female   DOB: [birth year], 79 y.o.   MRN: [id number]

## 2013-02-23 NOTE — Progress Notes (Signed)
[name] is a 79 y.o. 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 Admitted 02/12/2013 with new onset of left facial droop as well as left-sided weakness. Recent admit 02/05/2013 for recurrent left-sided weakness with MRI showing expected evolution of right corona radiata lentiform nuclei infarct which did occur in May no new acute abnormalities. Followup MRI 02/12/2013 again showing no evidence of acute infarct. Patient did not receive TPA. EEG negative for seizure. MRI lumbar spine showed no unilateral abnormality to explain left hemiplegia. Neurology followup patient remains on Xarelto for CVA prophylaxis workup indicated small subcortical infarct small vessel involvement not seen on MRI. Noted intermittent bouts of headache placed on low-dose Depakote Subjective/Complaints: Left clavicle her pain improved with Tylenol #3. Also in a sling. Is not using her hand very much however I encouraged her to open and close her left hand  Objective: Vital Signs: Blood pressure 115/68, pulse 66, temperature 98.3 F (36.8 C), temperature source Oral, resp. rate 18, height 5\' 7"  (1.702 m), weight 55.747 kg (122 lb 14.4 oz), SpO2 94.00%. Dg Chest 1 View  02/21/2013   CLINICAL DATA:  Status post fall with left shoulder and chest discomfort  EXAM: CHEST - 1 VIEW  COMPARISON:  Chest x-ray dated February 05, 2013.  FINDINGS: The lungs remain hyperinflated. There is no evidence of a pulmonary contusion or pneumothorax or pneumomediastinum. The cardiac silhouette is normal in size. The pulmonary vascularity is not engorged. The trachea is midline. The observed portions of the bony thorax exhibit no acute abnormalities. Only portions of the left shoulder are included in the field of view. The visualized portions appear normal.  IMPRESSION: 1. No acute abnormality of the visualized portions of the bony  thorax is demonstrated. 2. Mild stable hyperinflation is consistent with COPD. There is no evidence of a pulmonary contusion or pneumothorax.   Electronically Signed   By: David  [place]   On: 02/21/2013 12:54   Dg Clavicle Left  02/21/2013   CLINICAL DATA:  Status post fall.  Pain.  EXAM: LEFT CLAVICLE - 2+ VIEWS  COMPARISON:  Plain films left shoulder 02/21/2013.  FINDINGS: The patient has a mildly comminuted fracture of the distal clavicle. The fracture is approximately 3.3 cm medial to the [facility] joint with approximately 1/2 shaft width inferior displacement of the distal fragment. The [facility] joint is intact. Imaged left lung and ribs are clear.  IMPRESSION: Acute distal left clavicle fracture as described.   Electronically Signed   By: Drusilla Kanner M.D.   On: 02/21/2013 15:47   Ct Head Wo Contrast  02/21/2013   CLINICAL DATA:  Fall.  EXAM: CT HEAD WITHOUT CONTRAST  TECHNIQUE: Contiguous axial images were obtained from the base of the skull through the vertex without intravenous contrast.  COMPARISON:  MRI 02/12/2013.  Head CT 02/12/2013.  FINDINGS: No mass. No hydrocephalus. No hemorrhage. Chronic white matter periventricular changes are noted. Old infarct in the right corona radiata noted. Orbits are unremarkable. Paranasal sinuses where visualize are normal. Soft tissue swelling is noted over the left frontoparietal region. No evidence fracture.  IMPRESSION: No significant focal abnormality. Chronic ischemic change present. CT stable from 02/12/2013.   Electronically Signed   By: Maisie Fus  Register   On: 02/21/2013 13:54   Dg Shoulder Left  02/21/2013   CLINICAL DATA:  Pain status post fall.  EXAM: LEFT SHOULDER - 2+ VIEW  COMPARISON:  None.  FINDINGS: Two views of the left shoulder are submitted. The [facility] joint is not well evaluated by an acute fracture is suspected. The glenohumeral joint appears normal. The bones exhibit mild osteopenia.  IMPRESSION: The findings are worrisome for an acute fracture  through the distal aspect of the clavicular shaft just proximal to the [facility] joint.  These results were called by telephone at the time of interpretation on 02/21/2013 at 12:59 PM to Ms. Albin Felling at [facility].   Electronically Signed   By: David  [place]   On: 02/21/2013 13:00   No results found for this or any previous visit (from the past 72 hour(s)).   HEENT: normal Cardio: irregular Resp: CTA B/L and unlabored GI: BS positive and NT,ND Extremity:  Pulses positive and No Edema Skin:   Intact Neuro: Alert/Oriented, Cranial Nerve II-XII normal, Abnormal Sensory reduced LT on L side, Abnormal Motor 5/5 on R and 4-/5 LUE, 3-/5 Left HF , KE, ankle DF/PF, Abnormal FMC Ataxic/ dec FMC and Tone:  Within Normal Limits Musc/Skel:  Tenderness over distal left clavicle GEN NAD   Assessment/Plan: 1. Functional deficits secondary to Right subcortical infarct with LLE>>LUE weakness which require 3+ hours per day of interdisciplinary therapy in a comprehensive inpatient rehab setting. Physiatrist is providing close team supervision and 24 hour management of active medical problems listed below. Physiatrist and rehab team continue to assess barriers to discharge/monitor patient progress toward functional and medical goals. FIM: FIM - Bathing Bathing Steps Patient Completed: Chest;Right Arm;Left Arm;Abdomen;Front perineal area;Buttocks;Right upper leg;Left upper leg;Right lower leg (including foot);Left lower leg (including foot) Bathing: 5: Supervision: Safety issues/verbal cues  FIM - Upper Body Dressing/Undressing Upper body dressing/undressing steps patient completed: Thread/unthread right sleeve of pullover shirt/dresss;Thread/unthread left sleeve of pullover shirt/dress;Put head through opening of pull over shirt/dress;Pull shirt over trunk Upper body dressing/undressing: 5: Set-up assist to: Obtain clothing/put away FIM - Lower Body Dressing/Undressing Lower body dressing/undressing steps patient  completed: Thread/unthread right underwear leg;Thread/unthread left underwear leg;Thread/unthread right pants leg;Thread/unthread left pants leg;Pull pants up/down;Don/Doff right shoe;Don/Doff left shoe;Pull underwear up/down Lower body dressing/undressing: 5: Set-up assist to: Don/Doff TED stocking  FIM - Toileting Toileting steps completed by patient: Adjust clothing prior to toileting;Performs perineal hygiene;Adjust clothing after toileting Toileting Assistive Devices: Grab bar or rail for support Toileting: 5: Supervision: Safety issues/verbal cues  FIM - Diplomatic Services operational officer Devices: Walker;Elevated toilet seat Toilet Transfers: 4-From toilet/BSC: Min A (steadying Pt. > 75%);4-To toilet/BSC: Min A (steadying Pt. > 75%)  FIM - Bed/Chair Transfer Bed/Chair Transfer Assistive Devices: Arm rests;HOB elevated (HOB 50 deg, hemi walker for transfers) Bed/Chair Transfer: 2: Supine > Sit: Max A (lifting assist/Pt. 25-49%);2: Sit > Supine: Max A (lifting assist/Pt. 25-49%);4: Bed > Chair or W/C: Min A (steadying Pt. > 75%);3: Chair or W/C > Bed: Mod A (lift or lower assist)  FIM - Locomotion: Wheelchair Distance: 40 Locomotion: Wheelchair: 1: Total Assistance/staff pushes wheelchair (Pt<25%) FIM - Locomotion: Ambulation Locomotion: Ambulation Assistive Devices: Designer, industrial/product Ambulation/Gait Assistance: 4: Min assist Locomotion: Ambulation: 0: Activity did not occur  Comprehension Comprehension Mode: Auditory Comprehension: 6-Follows complex conversation/direction: With extra time/assistive device  Expression Expression Mode: Verbal Expression: 6-Expresses complex ideas: With extra time/assistive device  Social Interaction Social Interaction Mode: Asleep Social Interaction: 7-Interacts appropriately with others - No medications needed.  Problem Solving Problem Solving: 6-Solves complex problems: With extra time  Memory Memory: 6-More than reasonable amt of  time  Medical Problem List and Plan:  1. Suspect small  subcortical CVA not identified on MRI. Recent right lenticular nucleus infarct May 2014 with little residual  2. DVT Prophylaxis/Anticoagulation: Xarelto. Monitor for any bleeding episodes  3. Pain Management/headaches. Continue low-dose Depakote.  4. Neuropsych: This patient is capable of making decisions on her own behalf.  5. Hypertension. Norvasc 2.5 mg daily. Monitor with increased mobility  6. Hyperlipidemia. Lipitor  7. Rate Controlled A Fib 8.  Clavicle fx per ortho, sling and nonweightbearing, followup in the office 3 weeks. I'll also ask her to use her hand as much as possible but not bear weight, OT to maintain elbow ROM LOS (Days) 8 A FACE TO FACE EVALUATION WAS PERFORMED  Jeremih Dearmas E 02/23/2013, 9:47 AM

## 2013-02-23 NOTE — Progress Notes (Signed)
Occupational Therapy Weekly Progress Note  Patient Details  Name: [name] MRN: [id number] Date of Birth: 01/22/1925  Today's Date: 02/23/2013 Time: 1000-1100 Time Calculation (min): 60 min  Patient had met 4/4 STGs as of Wednesday, November 26th. She was able to use her LUE functionally and use it to support her in transfers. She had reached supervision with ADLs and transfers. She had an unfortunate fall that day that resulted in a distal clavicular fracture. She now has pain, is NWB and uses a sling. As a result, she is fearful of moving and requiring more assistance.  Patient continues to demonstrate the following deficits: pain, decreased mobility skills due to L arm pain and fear and therefore will continue to benefit from skilled OT intervention to enhance overall performance with BADL.  Patient progressing toward long term goals..  Plan of care revisions: LTGs modified from supervision to min assist due to physical change.. Pt will require more assistance due to recent clavicle fracture.  OT Short Term Goals Week 1:  OT Short Term Goal 1 (Week 1): Pt will demonstrate improved standing balance to stand with min assist as she pulls pants up/down hips. OT Short Term Goal 1 - Progress (Week 1): Partly met (as of 02/21/13, pt had met goal. Now fearful after fall.  Continue goal.) OT Short Term Goal 2 (Week 1): Pt will be able to transfer to elevated toilet seat with min assist from w/c. OT Short Term Goal 2 - Progress (Week 1): Partly met (as of 02/21/13, pt met goal. Now fearful after fall, continue to work on goal.) OT Short Term Goal 3 (Week 1): Pt will be able to transfer to shower seat with grab bars with min assist. OT Short Term Goal 3 - Progress (Week 1): Partly met (She had met this goal as of 11/26, but had a fall in shower that day. Need to reassess.) OT Short Term Goal 4 (Week 1): Pt's LUE strength will improve from a 3+/5 to a 4/5 to enable her to use her arm more actively  with transfers. OT Short Term Goal 4 - Progress (Week 1): Partly met (She had met this goal as of 11/26.  Now with clavicular fx inhibiting movement. ) Week 2:  OT Short Term Goal 1 (Week 2): Pt will be able to transfer to and from [facility] and or toilet with min assist. OT Short Term Goal 2 (Week 2): Pt will be able to don shirt with min assist to doff over L arm. OT Short Term Goal 3 (Week 2): Pt will be able to don underwear and pants with min assist. OT Short Term Goal 4 (Week 2): Pt will be able to bathe self with min assist.  Skilled Therapeutic Interventions/Progress Updates:    Upon arrival in the room, pt supine in bed. Pt stated she was dying to get OOB, bathe, and get dressed but was very fearful.  She was given several options for where to bathe and she chose in the bed today.  She did very well bathing herself only needing help with right arm and lower legs. She chose to put on pajamas and was given total help as the object of the therapy session was focused on her bed mobility skills of rolling and bridging her hips slightly. She did not roll to the L due to the shoulder.  Pt then sat up to EOB with max assist to prevent pain in UE.  She sat EOB for several minutes with close  S and then transferred to W/c to R side with min assist. Pt was aggreeable to standing. Her chair was placed in front of sink with support on L side as pt stood with min assist. She then was able to stand for 1 min with steadying assist. At end of session, pt provided with half lap tray for additional arm support. Pt resting in w/c with call light in reach.  Therapy Documentation Precautions:  Precautions Precautions: Fall Precaution Comments: NWB of LUE, sling to LUE Restrictions Weight Bearing Restrictions: No    Pain: Pain Assessment Pain Assessment: 0-10 Pain Score: 5  Pain Type: Acute pain Pain Location: Shoulder Pain Orientation: Left Pain Descriptors / Indicators: Aching Pain Onset: On-going Pain  Intervention(s):  (pt stated she had received her medication) ADL:  See FIM for current functional status  Therapy/Group: Individual Therapy  SAGUIER,JULIA 02/23/2013, 12:42 PM

## 2013-02-24 MED ORDER — SENNOSIDES-DOCUSATE SODIUM 8.6-50 MG PO TABS
2.0000 | ORAL_TABLET | Freq: Every day | ORAL | Status: DC
  Administered 2013-02-24: 2 via ORAL
  Filled 2013-02-24 (×2): qty 2

## 2013-02-24 NOTE — Progress Notes (Signed)
[name] is a 79 y.o. 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 Admitted 02/12/2013 with new onset of left facial droop as well as left-sided weakness. Recent admit 02/05/2013 for recurrent left-sided weakness with MRI showing expected evolution of right corona radiata lentiform nuclei infarct which did occur in May no new acute abnormalities. Followup MRI 02/12/2013 again showing no evidence of acute infarct. Patient did not receive TPA. EEG negative for seizure. MRI lumbar spine showed no unilateral abnormality to explain left hemiplegia. Neurology followup patient remains on Xarelto for CVA prophylaxis workup indicated small subcortical infarct small vessel involvement not seen on MRI. Noted intermittent bouts of headache placed on low-dose Depakote Subjective/Complaints: Left clavicle her pain improved with Tylenol #3. Also in a sling. Is not using her hand very much however I encouraged her to open and close her left hand Constipation, no abd pain, Nausea or vomiting  Objective: Vital Signs: Blood pressure 117/68, pulse 71, temperature 97.5 F (36.4 C), temperature source Oral, resp. rate 19, height 5\' 7"  (1.702 m), weight 55.747 kg (122 lb 14.4 oz), SpO2 96.00%. No results found. No results found for this or any previous visit (from the past 72 hour(s)).   HEENT: normal Cardio: irregular Resp: CTA B/L and unlabored GI: BS positive and NT,ND Extremity:  Pulses positive and No Edema Skin:   Intact Neuro: Alert/Oriented, Cranial Nerve II-XII normal, Abnormal Sensory reduced LT on L side, Abnormal Motor 5/5 on R and 4-/5 LUE, 3-/5 Left HF , KE, ankle DF/PF, Abnormal FMC Ataxic/ dec FMC and Tone:  Within Normal Limits Musc/Skel:  Tenderness over distal left clavicle GEN NAD   Assessment/Plan: 1. Functional deficits secondary to Right subcortical infarct with LLE>>LUE  weakness which require 3+ hours per day of interdisciplinary therapy in a comprehensive inpatient rehab setting. Physiatrist is providing close team supervision and 24 hour management of active medical problems listed below. Physiatrist and rehab team continue to assess barriers to discharge/monitor patient progress toward functional and medical goals. FIM: FIM - Bathing Bathing Steps Patient Completed: Chest;Left Arm;Abdomen;Front perineal area;Buttocks;Right upper leg;Left upper leg Bathing: 3: Mod-Patient completes 5-7 81f 10 parts or 50-74%  FIM - Upper Body Dressing/Undressing Upper body dressing/undressing steps patient completed: Thread/unthread right sleeve of pullover shirt/dresss;Thread/unthread left sleeve of pullover shirt/dress;Put head through opening of pull over shirt/dress;Pull shirt over trunk Upper body dressing/undressing: 1: Total-Patient completed less than 25% of tasks FIM - Lower Body Dressing/Undressing Lower body dressing/undressing steps patient completed: Thread/unthread right underwear leg;Thread/unthread left underwear leg;Thread/unthread right pants leg;Thread/unthread left pants leg;Pull pants up/down;Don/Doff right shoe;Don/Doff left shoe;Pull underwear up/down Lower body dressing/undressing: 1: Total-Patient completed less than 25% of tasks  FIM - Toileting Toileting steps completed by patient: Adjust clothing prior to toileting;Performs perineal hygiene;Adjust clothing after toileting Toileting Assistive Devices: Grab bar or rail for support Toileting: 5: Supervision: Safety issues/verbal cues  FIM - Diplomatic Services operational officer Devices: Walker;Elevated toilet seat Toilet Transfers: 4-From toilet/BSC: Min A (steadying Pt. > 75%);4-To toilet/BSC: Min A (steadying Pt. > 75%)  FIM - Bed/Chair Transfer Bed/Chair Transfer Assistive Devices: Arm rests;HOB elevated Bed/Chair Transfer: 3: Supine > Sit: Mod A (lifting assist/Pt. 50-74%/lift 2 legs;4:  Bed > Chair or W/C: Min A (steadying Pt. > 75%);4: Chair or W/C > Bed: Min A (steadying Pt. > 75%)  FIM - Locomotion: Wheelchair Distance: 40 Locomotion: Wheelchair: 1: Total Assistance/staff pushes wheelchair (Pt<25%) FIM -  Locomotion: Ambulation Locomotion: Ambulation Assistive Devices: Chief Operating Officer Ambulation/Gait Assistance: 4: Min assist Locomotion: Ambulation: 1: Travels less than 50 ft with minimal assistance (Pt.>75%)  Comprehension Comprehension Mode: Auditory Comprehension: 6-Follows complex conversation/direction: With extra time/assistive device  Expression Expression Mode: Verbal Expression: 6-Expresses complex ideas: With extra time/assistive device  Social Interaction Social Interaction Mode: Asleep Social Interaction: 7-Interacts appropriately with others - No medications needed.  Problem Solving Problem Solving: 6-Solves complex problems: With extra time  Memory Memory: 6-More than reasonable amt of time  Medical Problem List and Plan:  1. Suspect small subcortical CVA not identified on MRI. Recent right lenticular nucleus infarct May 2014 with little residual  2. DVT Prophylaxis/Anticoagulation: Xarelto. Monitor for any bleeding episodes  3. Pain Management/headaches. Continue low-dose Depakote.  4. Neuropsych: This patient is capable of making decisions on her own behalf.  5. Hypertension. Norvasc 2.5 mg daily. Monitor with increased mobility  6. Hyperlipidemia. Lipitor  7. Rate Controlled A Fib 8.  Clavicle fx per ortho, sling and nonweightbearing, followup in the office 3 weeks. I'll also ask her to use her hand as much as possible but not bear weight, OT to maintain elbow ROM LOS (Days) 9 A FACE TO FACE EVALUATION WAS PERFORMED  [name] 02/24/2013, 9:45 AM

## 2013-02-24 NOTE — Plan of Care (Signed)
Problem: RH BOWEL ELIMINATION Goal: RH STG MANAGE BOWEL WITH ASSISTANCE STG Manage Bowel with minimal Assistance.  Outcome: Not Progressing No BM since 11/24.  Refused to be checked for impaction.

## 2013-02-25 ENCOUNTER — Inpatient Hospital Stay (HOSPITAL_COMMUNITY): Payer: Medicare Other | Admitting: *Deleted

## 2013-02-25 ENCOUNTER — Inpatient Hospital Stay (HOSPITAL_COMMUNITY): Payer: Medicare Other

## 2013-02-25 ENCOUNTER — Inpatient Hospital Stay (HOSPITAL_COMMUNITY): Payer: Medicare Other | Admitting: Physical Therapy

## 2013-02-25 MED ORDER — SENNOSIDES-DOCUSATE SODIUM 8.6-50 MG PO TABS
2.0000 | ORAL_TABLET | Freq: Two times a day (BID) | ORAL | Status: DC
  Administered 2013-02-25 – 2013-02-28 (×6): 2 via ORAL
  Filled 2013-02-25 (×9): qty 2

## 2013-02-25 MED ORDER — BISACODYL 10 MG RE SUPP: 10.0000 mg | Freq: Once | RECTAL | Status: DC

## 2013-02-25 NOTE — Progress Notes (Signed)
Physical Therapy Note  Patient Details  Name: [name] MRN: [id number] Date of Birth: 06/26/1924 Today's Date: 02/25/2013  1300-1355 (55 minutes) group Pain: pt reports intermittent pain left shoulder (unrated)/ premedicated Pt participated in PT group session focused on gait training/safety/endurnace; pt ambulates 25-27 feet X 3 HW min assist with mod/max vcs for increased step length Lt LE / maintain erect standing ; distance limited by fatigue; transfers stand/turn min assist ; sit to supine mod assist bilateral LEs; bed alarm activated.    Josiah Wojtaszek,JIM 02/25/2013, 1:57 PM

## 2013-02-25 NOTE — Progress Notes (Signed)
Physical Therapy Note  Patient Details  Name: [name] MRN: [id number] Date of Birth: 09/07/1924 Today's Date: 02/25/2013  8:45 - 9:25 40 minutes Individual session Patient denies pain resting in bed. Patient did c/o pain in left shoulder once during resistive exercise for LE's. Patient reports occasional shooting pain in left shoulder area.  Patient resting in bed. Patient reported that she did not want to get up but was agreeable to perform exercises in bed.  Patient performed active right LE and active assistive left LE exercises in supine x 10 - 12 reps each of: heel slides, SAQ's, ankle pumps, hip abduction/adduction, hooklying hip abduction/adduction, pillow squeezes, and glut sets. Reviewed therapy schedule for the rest of the day. Patient left in bed with all items in reach.   Arelia Longest M 02/25/2013, 9:40 AM

## 2013-02-25 NOTE — Progress Notes (Signed)
Occupational Therapy Note  Patient Details  Name: [name] MRN: [id number] Date of Birth: 11-03-1924 Today's Date: 02/25/2013  Time:1020-1130  (70 min)  1st session Pain: Left shoulder 5/10   Individual session  1st session:  Engaged in bathing/dressing at sink level per pt, reqyest,  Mod assist supine to sit EOB with increased time.  Pt ambulated to sink with min and hemi walker.  Bathed self in sitting and standing.  Doffed, donned arm  sling with total assist.  Dressed left leg and donned right sleevewith much encouragement to try to work through the stiffness and pain in her shoulder.    Wheeled pt to toilet and transferred to 3n1 over toilet with min assist and icreased time.  Pt. C/o dizziness.  y.  BP= 117/73.  Pt had large BM.  Pt. Transferred back to bed with mod assist.  Daughter, Wenda present during entire session and needed cues to let pt do as much as possible for self.  .    Time: 1500-1530  (30 min)  2nd session Pain:  Left shoulder 5/10  Asked RN for pain meds Individual session  2nd session:  Pt. Lying in bed.  Went from supine to sit with min assist.  Transferred to wc with mod assist and taken to gym.  Engaged in [place] to left elbow, forearm, wrist, finger.  Did standing balance with hi mat for stability for modified pendulum exercise for stretching.  Pt. Tolerated session well.  Transferred back from mat to wc with mod assist and then from wc to bed with mod.  Pt needed verbal cues and min assist to go from sit to supine.  Daughters in room and call bell in reach.       Humberto Seals 02/25/2013, 10:42 AM

## 2013-02-25 NOTE — Progress Notes (Signed)
[name] is a 79 y.o. 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 Admitted 02/12/2013 with new onset of left facial droop as well as left-sided weakness. Recent admit 02/05/2013 for recurrent left-sided weakness with MRI showing expected evolution of right corona radiata lentiform nuclei infarct which did occur in May no new acute abnormalities. Followup MRI 02/12/2013 again showing no evidence of acute infarct. Patient did not receive TPA. EEG negative for seizure. MRI lumbar spine showed no unilateral abnormality to explain left hemiplegia. Neurology followup patient remains on Xarelto for CVA prophylaxis workup indicated small subcortical infarct small vessel involvement not seen on MRI. Noted intermittent bouts of headache placed on low-dose Depakote Subjective/Complaints: Left clavicle her pain improved with Tylenol #3. Also in a sling. Is not using her hand very much however I encouraged her to open and close her left hand Patient is discouraged. Have encouraged her to perform L. range of motion as well as wrist and hand range of motion. She does not need her sling on when she is in bed. Poor appetite Constipation, no abd pain, Nausea or vomiting  Objective: Vital Signs: Blood pressure 115/73, pulse 98, temperature 97.9 F (36.6 C), temperature source Oral, resp. rate 20, height 5\' 7"  (1.702 m), weight 55.747 kg (122 lb 14.4 oz), SpO2 95.00%. No results found. No results found for this or any previous visit (from the past 72 hour(s)).   HEENT: normal Cardio: irregular Resp: CTA B/L and unlabored GI: BS positive and NT,ND Extremity:  Pulses positive and No Edema Skin:   Intact Neuro: Alert/Oriented, Cranial Nerve II-XII normal, Abnormal Sensory reduced LT on L side, Abnormal Motor 5/5 on R and 4-/5 LUE, 3-/5 Left HF , KE, ankle DF/PF, Abnormal FMC Ataxic/ dec FMC and  Tone:  Within Normal Limits Musc/Skel:  Tenderness over distal left clavicle GEN NAD   Assessment/Plan: 1. Functional deficits secondary to Right subcortical infarct with LLE>>LUE weakness which require 3+ hours per day of interdisciplinary therapy in a comprehensive inpatient rehab setting. Physiatrist is providing close team supervision and 24 hour management of active medical problems listed below. Physiatrist and rehab team continue to assess barriers to discharge/monitor patient progress toward functional and medical goals. FIM: FIM - Bathing Bathing Steps Patient Completed: Chest;Left Arm;Abdomen;Front perineal area;Buttocks;Right upper leg;Left upper leg Bathing: 3: Mod-Patient completes 5-7 39f 10 parts or 50-74%  FIM - Upper Body Dressing/Undressing Upper body dressing/undressing steps patient completed: Thread/unthread right sleeve of pullover shirt/dresss;Thread/unthread left sleeve of pullover shirt/dress;Put head through opening of pull over shirt/dress;Pull shirt over trunk Upper body dressing/undressing: 1: Total-Patient completed less than 25% of tasks FIM - Lower Body Dressing/Undressing Lower body dressing/undressing steps patient completed: Thread/unthread right underwear leg;Thread/unthread left underwear leg;Thread/unthread right pants leg;Thread/unthread left pants leg;Pull pants up/down;Don/Doff right shoe;Don/Doff left shoe;Pull underwear up/down Lower body dressing/undressing: 1: Total-Patient completed less than 25% of tasks  FIM - Toileting Toileting steps completed by patient: Adjust clothing prior to toileting;Performs perineal hygiene;Adjust clothing after toileting Toileting Assistive Devices: Grab bar or rail for support Toileting: 5: Supervision: Safety issues/verbal cues  FIM - Diplomatic Services operational officer Devices: Walker;Elevated toilet seat Toilet Transfers: 4-From toilet/BSC: Min A (steadying Pt. > 75%);4-To toilet/BSC: Min A (steadying  Pt. > 75%)  FIM - Bed/Chair Transfer Bed/Chair Transfer Assistive Devices: Arm rests;HOB elevated Bed/Chair Transfer: 3: Supine > Sit: Mod A (lifting assist/Pt. 50-74%/lift 2 legs;4: Bed > Chair or  W/C: Min A (steadying Pt. > 75%);4: Chair or W/C > Bed: Min A (steadying Pt. > 75%)  FIM - Locomotion: Wheelchair Distance: 40 Locomotion: Wheelchair: 1: Total Assistance/staff pushes wheelchair (Pt<25%) FIM - Locomotion: Ambulation Locomotion: Ambulation Assistive Devices: Chief Operating Officer Ambulation/Gait Assistance: 4: Min assist Locomotion: Ambulation: 1: Travels less than 50 ft with minimal assistance (Pt.>75%)  Comprehension Comprehension Mode: Auditory Comprehension: 6-Follows complex conversation/direction: With extra time/assistive device  Expression Expression Mode: Verbal Expression: 6-Expresses complex ideas: With extra time/assistive device  Social Interaction Social Interaction Mode: Asleep Social Interaction: 7-Interacts appropriately with others - No medications needed.  Problem Solving Problem Solving: 6-Solves complex problems: With extra time  Memory Memory: 6-More than reasonable amt of time  Medical Problem List and Plan:  1. Suspect small subcortical CVA not identified on MRI. Recent right lenticular nucleus infarct May 2014 with little residual  2. DVT Prophylaxis/Anticoagulation: Xarelto. Monitor for any bleeding episodes  3. Pain Management/left distal clavicular fracture, continue Tylenol #3. Increased Senokot to avoid constipation. May need suppository tonight 4. Neuropsych: This patient is capable of making decisions on her own behalf.  5. Hypertension. Norvasc 2.5 mg daily. Monitor with increased mobility  6. Hyperlipidemia. Lipitor  7. Rate Controlled A Fib 8.  Clavicle fx per ortho, sling and nonweightbearing, followup in the office 3 weeks. I'll also ask her to use her hand as much as possible but not bear weight, maintain elbow ROM LOS (Days) 10 A FACE  TO FACE EVALUATION WAS PERFORMED  [name] E 02/25/2013, 10:30 AM

## 2013-02-26 ENCOUNTER — Inpatient Hospital Stay (HOSPITAL_COMMUNITY): Payer: Medicare Other

## 2013-02-26 ENCOUNTER — Inpatient Hospital Stay (HOSPITAL_COMMUNITY): Payer: Medicare Other | Admitting: Occupational Therapy

## 2013-02-26 DIAGNOSIS — G8110 Spastic hemiplegia affecting unspecified side: Secondary | ICD-10-CM

## 2013-02-26 DIAGNOSIS — I6330 Cerebral infarction due to thrombosis of unspecified cerebral artery: Secondary | ICD-10-CM

## 2013-02-26 NOTE — Progress Notes (Signed)
Occupational Therapy Note  Patient Details  Name: [name] MRN: [id number] Date of Birth: April 29, 1924 Today's Date: 02/26/2013    Time: 1330-1400 Pt c/o 2/10 pain in Lt shoulder/clavicle; RN aware and repositioned Individual therapy  Pt resting in bed with daughter at side.  Pt transferred to w/c and transitioned to therapy gym and engaged in dynamic standing activities with focus on balance and weight shift.  Pt initially engaged in task requiring use of RUE to retrieve and place objects in container in front and then transitioned to task requiring patient to shift weight onto RLE to retrieve objects outside of BOS; pt then required to reach across body with RUE to place objects on table.  Pt also engaged in functional amb with HHA.  Pt required min verbal cues to lengthen stride and stand erect during task.  Pt returned to room and per pt request returned to bed with all needs within reach and daughter present. Lavone Neri [facility] 02/26/2013, 2:36 PM

## 2013-02-26 NOTE — Progress Notes (Signed)
Physical Therapy Note  Patient Details  Name: [name] MRN: [id number] Date of Birth: 09/05/1924 Today's Date: 02/26/2013  0800-0900, 1455-1540 60 min, 45 min individual therapy  Tx 1:  Pt stated she was tired; L shoulder painful, unrated.  RN aware.  Therapeutic exercise performed with bil LEs to increase strength for functional mobility: bil bridging, R and L straight leg raises, 10 x 1 each.  Rolling L with min assist.  Supine>< sit x 2 with mod assist for bil LEs due to apparent anticipation of L shoulder pain as well as true shoulder pain.   Pt sat bedside x 5 min x 1 while drinking water.  She c/o mild dizziness.  BP 122/81, HR 86, O2 sats 93% in sitting.  Sit> stand attempting gait in room with min guard assist with HW, but pt too dizzy, needing to sit down.  Discussed dizziness with RN; pt has not had any meds that would cause dizziness.  Pt eventually stated she would try some vanilla yogurt. In sitting, she stirred yogurt with L hand, yogurt cup stabilized between knees.  Sat bedside x 3 minutes, eating 3 spoonfuls of yogurt.  She stated she needed to lie down again.  Pt left supine in bed, positioned for comfort re: L shoulder; all needs in place.  tx 2:  Pain L shoulder rated 1/10; premedicated  Pt stated she usually went to the toilet 2-3x/night, PTA.  She is concerned about being able to do this at d/c.  Downstairs BR has attached bath, and it may be 15' away, per pt.  Will discuss with OT.  neuromuscular re-education via forced use for w/c propulsion using bil LEs x 50' x 2; Gait without AD, kicking empty box with L foot, and gait sideways at hallway rail, to elicit L hip abduction; sit> stand focusing on L foot placement and = wt bearing.  Supine> sit on R side of bed, with min assist, extra time; pt more successful with slow trunk flexion as she slid R and L LEs off edge.  She did not move through sidelying as it seems more painful.  Gait in room with HW x  15' with min guard assist.  Gait with HW x 25' with min assist, max VCs for step sequence, = step lengths, upright stance, forward gaze.    Pt fatigued suddenly, stating she needed to sit, mild c/o dizziness.   Pt requested returning to bed; positioned comfortably and all needs placed within reach; bed alarm set.  [name] 02/26/2013, 7:56 AM

## 2013-02-26 NOTE — Progress Notes (Signed)
Occupational Therapy Session Note  Patient Details  Name: [name] MRN: [id number] Date of Birth: [birth year]  Today's Date: 02/26/2013 Time: 1000-1100 Time Calculation (min): 60 min  Short Term Goals: Week 1:  OT Short Term Goal 1 (Week 1): Pt will demonstrate improved standing balance to stand with min assist as she pulls pants up/down hips. OT Short Term Goal 1 - Progress (Week 1): Partly met (as of 02/21/13, pt had met goal. Now fearful after fall.  Continue goal.) OT Short Term Goal 2 (Week 1): Pt will be able to transfer to elevated toilet seat with min assist from w/c. OT Short Term Goal 2 - Progress (Week 1): Partly met (as of 02/21/13, pt met goal. Now fearful after fall, continue to work on goal.) OT Short Term Goal 3 (Week 1): Pt will be able to transfer to shower seat with grab bars with min assist. OT Short Term Goal 3 - Progress (Week 1): Partly met (She had met this goal as of 11/26, but had a fall in shower that day. Need to reassess.) OT Short Term Goal 4 (Week 1): Pt's LUE strength will improve from a 3+/5 to a 4/5 to enable her to use her arm more actively with transfers. OT Short Term Goal 4 - Progress (Week 1): Partly met (She had met this goal as of 11/26.  Now with clavicular fx inhibiting movement. ) Week 2:  OT Short Term Goal 1 (Week 2): Pt will be able to transfer to and from [facility] and or toilet with min assist. OT Short Term Goal 2 (Week 2): Pt will be able to don shirt with min assist to doff over L arm. OT Short Term Goal 3 (Week 2): Pt will be able to don underwear and pants with min assist. OT Short Term Goal 4 (Week 2): Pt will be able to bathe self with min assist.  Skilled Therapeutic Interventions/Progress Updates:    Pt seen this session for BADL retraining of B/D/G with a focus on functional mobility and adaptive techniques as Left arm is in a sling. Pt was able to sit up from bed with min to mod assist with assisting upper trunk from supine to sit.  She  sat at EOB with supervision then stood and completed stand pivot transfer with min assist to w/c.  Pt was agreeable to bathe at sink from w/c level. She was able to wash all of her UB with only assist to steady right shoulder without the sling. She donned a button up top and needed assist to pull shirt around her back, other wise she was able to thread her sleeves and fasten buttons.  Pt stood at least 6-7 x at the sink with only steadying assist for LB self care and dressing. She was able to bathe her LB, don pants over feet, but needed assist to fully pull pants over L hip.  Pt then completed washing her face and brushing her hair.  Pt then worked on sit to stands from w/c only 3x as once she was in standing she felt dizzy and weak and could only tolerate standing less than a minute.  Attempted to work on visual fixation with head turns with focusing on an object at arms length and also from 2 feet away.  Pt was only able to tolerate turning her head slightly and only 2x.  Pt resting in chair at end of session with call light in reach and she was encouraged to drink more water.  Therapy Documentation Precautions:  Precautions Precautions: Fall Precaution Comments: NWB of LUE, sling to LUE Restrictions Weight Bearing Restrictions: No   Vital Signs: Therapy Vitals Temp: 97.7 F (36.5 C) Temp src: Oral Pulse Rate: 78 BP: 128/78 mmHg Pain: Pain Assessment Pain Assessment: 0-10 Pain Score: 5  Pain Type: Acute pain Pain Location: Shoulder Pain Orientation: Left Pain Descriptors / Indicators: Aching Pain Onset: With Activity Pain Intervention(s): Repositioned;Rest ADL:  See FIM for current functional status  Therapy/Group: Individual Therapy  Awab Abebe 02/26/2013, 11:17 AM

## 2013-02-26 NOTE — Telephone Encounter (Signed)
I called and spoke with [name]. She will be dropping off new FMLA forms today.

## 2013-02-26 NOTE — Progress Notes (Signed)
[name] is a 79 y.o. 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 Admitted 02/12/2013 with new onset of left facial droop as well as left-sided weakness. Recent admit 02/05/2013 for recurrent left-sided weakness with MRI showing expected evolution of right corona radiata lentiform nuclei infarct which did occur in May no new acute abnormalities. Followup MRI 02/12/2013 again showing no evidence of acute infarct. Patient did not receive TPA. EEG negative for seizure. MRI lumbar spine showed no unilateral abnormality to explain left hemiplegia. Neurology followup patient remains on Xarelto for CVA prophylaxis workup indicated small subcortical infarct small vessel involvement not seen on MRI. Noted intermittent bouts of headache placed on low-dose Depakote Subjective/Complaints: Left clavicle her pain improved with Tylenol #3.Marland Kitchen She does not need her sling on when she is in bed.  Poor appetite Large loose BM yesterday no incontinence, no abd pain, Nausea or vomiting  Objective: Vital Signs: Blood pressure 128/78, pulse 78, temperature 97.7 F (36.5 C), temperature source Oral, resp. rate 18, height 5\' 7"  (1.702 m), weight 55.747 kg (122 lb 14.4 oz), SpO2 95.00%. No results found. No results found for this or any previous visit (from the past 72 hour(s)).   HEENT: normal Cardio: irregular Resp: CTA B/L and unlabored GI: BS positive and NT,ND Extremity:  Pulses positive and No Edema Skin:   Intact Neuro: Alert/Oriented, Cranial Nerve II-XII normal, Abnormal Sensory reduced LT on L side, Abnormal Motor 5/5 on R and 4-/5 LUE, 3-/5 Left HF , KE, ankle DF/PF, Abnormal FMC Ataxic/ dec FMC and Tone:  Within Normal Limits Musc/Skel:  Tenderness over distal left clavicle GEN NAD   Assessment/Plan: 1. Functional deficits secondary to Right subcortical infarct with LLE>>LUE weakness  which require 3+ hours per day of interdisciplinary therapy in a comprehensive inpatient rehab setting. Physiatrist is providing close team supervision and 24 hour management of active medical problems listed below. Physiatrist and rehab team continue to assess barriers to discharge/monitor patient progress toward functional and medical goals. Original discharge date 12 2. Has new distal clavicular fracture. Question whether it extension may be helpful. FIM: FIM - Bathing Bathing Steps Patient Completed: Chest;Left Arm;Abdomen;Front perineal area;Buttocks;Right upper leg;Left upper leg Bathing: 3: Mod-Patient completes 5-7 60f 10 parts or 50-74%  FIM - Upper Body Dressing/Undressing Upper body dressing/undressing steps patient completed: Button/unbutton shirt;Thread/unthread right sleeve of front closure shirt/dress Upper body dressing/undressing: 2: Max-Patient completed 25-49% of tasks FIM - Lower Body Dressing/Undressing Lower body dressing/undressing steps patient completed: Thread/unthread right underwear leg;Thread/unthread left underwear leg;Thread/unthread right pants leg;Thread/unthread left pants leg Lower body dressing/undressing: 2: Max-Patient completed 25-49% of tasks  FIM - Toileting Toileting steps completed by patient: Adjust clothing prior to toileting;Adjust clothing after toileting Toileting Assistive Devices: Grab bar or rail for support Toileting: 3: Mod-Patient completed 2 of 3 steps  FIM - Diplomatic Services operational officer Devices: Elevated toilet seat;Grab bars Toilet Transfers: 4-From toilet/BSC: Min A (steadying Pt. > 75%);4-To toilet/BSC: Min A (steadying Pt. > 75%)  FIM - Bed/Chair Transfer Bed/Chair Transfer Assistive Devices: Bed rails Bed/Chair Transfer: 4: Supine > Sit: Min A (steadying Pt. > 75%/lift 1 leg);4: Sit > Supine: Min A (steadying pt. > 75%/lift 1 leg);3: Bed > Chair or W/C: Mod A (lift or lower assist);3: Chair or W/C > Bed: Mod A (lift  or lower assist)  FIM - Locomotion: Wheelchair Distance: 40 Locomotion: Wheelchair: 1: Total Assistance/staff pushes wheelchair (Pt<25%)  FIM - Locomotion: Ambulation Locomotion: Ambulation Assistive Devices: Walker - Hemi Ambulation/Gait Assistance: 4: Min assist Locomotion: Ambulation: 1: Travels less than 50 ft with minimal assistance (Pt.>75%)  Comprehension Comprehension Mode: Auditory Comprehension: 6-Follows complex conversation/direction: With extra time/assistive device  Expression Expression Mode: Verbal Expression: 6-Expresses complex ideas: With extra time/assistive device  Social Interaction Social Interaction Mode: Asleep Social Interaction: 7-Interacts appropriately with others - No medications needed.  Problem Solving Problem Solving: 6-Solves complex problems: With extra time  Memory Memory: 6-More than reasonable amt of time  Medical Problem List and Plan:  1. Suspect small subcortical CVA not identified on MRI. Recent right lenticular nucleus infarct May 2014 with little residual  2. DVT Prophylaxis/Anticoagulation: Xarelto. Monitor for any bleeding episodes  3. Pain Management/left distal clavicular fracture, continue Tylenol #3. Increased Senokot to avoid constipation. May need suppository tonight 4. Neuropsych: This patient is capable of making decisions on her own behalf.  5. Hypertension. Norvasc 2.5 mg daily. Monitor with increased mobility  6. Hyperlipidemia. Lipitor  7. Rate Controlled A Fib 8.  Clavicle fx per ortho, sling and nonweightbearing, followup in the office 3 weeks. I'll also ask her to use her hand as much as possible but not bear weight, maintain elbow ROM LOS (Days) 11 A FACE TO FACE EVALUATION WAS PERFORMED  Edmond Ginsberg E 02/26/2013, 8:13 AM

## 2013-02-27 ENCOUNTER — Encounter (HOSPITAL_COMMUNITY): Payer: Medicare Other

## 2013-02-27 ENCOUNTER — Inpatient Hospital Stay (HOSPITAL_COMMUNITY): Payer: Medicare Other | Admitting: Occupational Therapy

## 2013-02-27 ENCOUNTER — Inpatient Hospital Stay (HOSPITAL_COMMUNITY): Payer: Medicare Other | Admitting: Physical Therapy

## 2013-02-27 ENCOUNTER — Inpatient Hospital Stay (HOSPITAL_COMMUNITY): Payer: Medicare Other

## 2013-02-27 LAB — CBC
HCT: 35 % — ABNORMAL LOW (ref 36.0–46.0)
MCH: 30.2 pg (ref 26.0–34.0)
MCHC: 33.4 g/dL (ref 30.0–36.0)
Platelets: 173 10*3/uL (ref 150–400)
RDW: 13.5 % (ref 11.5–15.5)
WBC: 6.2 10*3/uL (ref 4.0–10.5)

## 2013-02-27 LAB — BASIC METABOLIC PANEL
BUN: 14 mg/dL (ref 6–23)
Calcium: 8.9 mg/dL (ref 8.4–10.5)
Chloride: 101 mEq/L (ref 96–112)
GFR calc Af Amer: 73 mL/min — ABNORMAL LOW (ref >90)
Potassium: 4.4 mEq/L (ref 3.5–5.1)

## 2013-02-27 MED ORDER — PANTOPRAZOLE SODIUM 40 MG PO TBEC
40.0000 mg | DELAYED_RELEASE_TABLET | Freq: Every day | ORAL | Status: DC
  Administered 2013-02-27 – 2013-03-03 (×5): 40 mg via ORAL
  Filled 2013-02-27 (×6): qty 1

## 2013-02-27 MED ORDER — ALUM & MAG HYDROXIDE-SIMETH 200-200-20 MG/5ML PO SUSP
15.0000 mL | Freq: Four times a day (QID) | ORAL | Status: DC | PRN
  Administered 2013-02-27: 15 mL via ORAL
  Filled 2013-02-27: qty 30

## 2013-02-27 NOTE — Progress Notes (Signed)
Physical Therapy Assessment and Plan  Patient Details  Name: [name] MRN: [id number] Date of Birth: December 30, 1924  PT Diagnosis: BPPV, Dizziness and Vertigo of central origin   Today's Date: 02/27/2013 Time: 5956-3875 Time Calculation (min): 45 min  Problem List:  Patient Active Problem List   Diagnosis Date Noted  . Subcortical infarction 02/16/2013  . Possible Seizures 02/14/2013  . TIA (transient ischemic attack) 02/12/2013  . Stroke-like symptom 02/12/2013  . Hyperglycemia 02/12/2013  . High cholesterol   . CVA (cerebral vascular accident) 02/05/2013  . Left leg weakness 02/05/2013  . Atrial fibrillation 02/05/2013  . Chronic anticoagulation 02/05/2013  . HTN (hypertension) 11/14/2012  . right brain stroke too small to be seen on MRI 08/02/2012  . Syncope 06/01/2012  . Right ureteral stone 09/15/2010  . Shoulder pain 08/19/2010  . Atrial flutter 07/17/2010  . Orthostatic hypotension 07/17/2010  . OSTEOPENIA 06/03/2009  . MIGRAINE HEADACHE 02/05/2008  . CATARACTS, BILATERAL 02/05/2008  . DECREASED HEARING 02/05/2008  . GEN OSTEOARTHROSIS INVOLVING MULTIPLE SITES 02/05/2008  . COLONIC POLYPS, HX OF 02/05/2008    Past Medical History:  Past Medical History  Diagnosis Date  . Arthritis   . Hypertension   . Migraine   . Colon polyp   . Hearing difficulty   . Torus palatinus   . Atrial flutter January, 2012  . Stroke 08/02/12     right lenticular nucleus infarct  . Left leg weakness 02/05/2013  . Chronic anticoagulation   . High cholesterol    Past Surgical History:  Past Surgical History  Procedure Laterality Date  . Cataract extraction w/ intraocular lens  implant, bilateral  2006-2008  . Ganglion cyst excision Bilateral 1938,1954,2003,2005    "wrists/hand" (08/01/2012)  . Cardioversion  05/19/2010    Dr. Jacinto Halim  . Tonsillectomy  ~ 1935  . Appendectomy  02/19/53    `    Assessment & Plan Clinical Impression: [name] is a 79 y.o. right-handed  female with history of atrial fibrillation on Xarelto, hypertension, as well as CVA May 2014 right lenticular nucleus infarct with little residual. Admitted 02/12/2013 with new onset of left facial droop as well as left-sided weakness. Recent admit 02/05/2013 for recurrent left-sided weakness with MRI showing expected evolution of right corona radiata lentiform nuclei infarct which did occur in May no new acute abnormalities. Followup MRI 02/12/2013 again showing no evidence of acute infarct. Patient did not receive TPA. EEG negative for seizure. MRI lumbar spine showed no unilateral abnormality to explain left hemiplegia. Neurology followup patient remains on Xarelto for CVA prophylaxis workup indicated small subcortical infarct small vessel involvement not seen on MRI. Noted intermittent bouts of headache placed on low-dose Depakote Patient transferred to CIR on 02/15/2013 .   Patient has been reporting frequent bouts of dizziness during therapy treatments and a vestibular evaluation requested. Pt demonstrates both peripheral and central findings however evaluation limited due to inconsistent expression of symptoms (says dizziness worse but rates dizziness lower on number scale, says that in sitting she feels better than when supine then later reports opposite) also pt with low frustration tolerance and generalized feeling ill today. Difficult to recommend treatment due to this however at this time I recommend the following:  Recommendations:  1] visual stabilization with minimal distraction/business in visual field (shoot for blank wall behind target). Also keep target simple, use "A"s in room. Pt needs frequent cues with this task and has difficulty maintaining her gaze on the target, promote during bed mobility, supine <>  sit, transfers, and sit <> stand. Eventually work towards using with ambulation.   2] VOR x 1 exercises in sitting. Pt had difficulty maintaining focus on target and will benefit from  further practice. Slowly increase speed and magnitude of head turns. Start with blank visual field (will likely not be able to handle busy visual field until after D/C)  3] Follow up with OP vestibular program, due to positional complaints and age - even though nystagmus not seen today- feel pt likely has some BPPV. Do not feel she is a good candidate to full testing or repositioning due to clavicle fracture and feeling ill at this time. Pt MAY tolerate repositioning later once fracture has healed.     Skilled Therapeutic Intervention Pt did not tolerate practicing treatments, declined to sit on EOB any more. Handouts provided and in room.   PT Evaluation General Amount of Missed PT Time (min): 15 Minutes Missed Time Reason: Patient fatigue  Pain Pain Assessment Faces Pain Scale: Hurts little more Pain Type: Acute pain Pain Location: Shoulder Pain Orientation: Left Pain Descriptors / Indicators: Aching Pain Onset: With Activity Pain Intervention(s): Repositioned  Examination findings: Eye Alignment WNL  Spontaneous  Nystagmus Negative  Gaze holding nystagmus Negative  Smooth pursuit Slightly impaired, did not reproduce symptoms  Oculomotor WFL  Saccades WFL  VOR slow Not tested -pt would not tolerate  Head Thrust Test Not tested -pt would not tolerate  Head Shaking Nystagmus Not tested- pt would not tolerate  Rt. Hallpike Dix NT- pt would not tolerate  Lt. Hallpike Dix NT-pt would not tolerate  Rt. Roll Test NT-pt would not tolerate  Lt. Roll Test  NT-pt would not tolerate  Motion sensitivity Minimal - reports TV sometimes bothers her  Positional changes Positive for dizziness, no nystagmus noted but pt reports it does resolve <1 min.    Visual- Vestibular Interactions:  - Sitting- VOR x 1 reproduced symptoms, pt has difficulty maintaining focus on target particularly with head turn to Rt. - Standing- visual targeting decreased symptoms (per report - but inconsistent) -  Walking - side stepping - pt unable to maintain visual targeting secondary to distraction- continue to assess.    Refer to Care Plan for Long Term Goals  Recommendations for other services: None  Discharge Criteria: Patient will be discharged from PT if patient refuses treatment 3 consecutive times without medical reason, if treatment goals not met, if there is a change in medical status, if patient makes no progress towards goals or if patient is discharged from hospital.  The above assessment, treatment plan, treatment alternatives and goals were discussed and mutually agreed upon: by patient  Wilhemina Bonito 02/27/2013, 12:38 PM

## 2013-02-27 NOTE — Progress Notes (Signed)
NUTRITION FOLLOW UP  Intervention:   Recommend appetite stimulant - discussed with rehab PA, Dan. Continue [employer] daily - encourage as able. RD to continue to follow nutrition care plan.  Nutrition Dx:   Increased nutrient needs related to participation in therapies as evidenced by estimated needs. Ongoing.  Goal:  Intake to meet >90% of estimated nutrition needs. Unmet.  Monitor:  weight trends, lab trends, I/O's, PO intake, supplement tolerance  Assessment:   PMHx significant for afib, HTN, CVA in May 2014. Admitted 11/17 with suspect small subcortical CVA not identified on MRI.  Underwent a mechanical fall in the shower on 11/26 and sustained a L distal clavicle fx.  Remains on Regular diet - intake is very poor. Pt is a small eater at baseline with very defined food preferences. She has tried a Nurse, adult and an [employer] and doesn't care for them. Family has tried to bring in some foods for her however, she isn't really eating much. RN suggested appetite stimulant, as pt keeps complaining that she has no appetite.   Height: Ht Readings from Last 1 Encounters:  02/15/13 5\' 7"  (1.702 m)    Weight Status:   Wt Readings from Last 1 Encounters:  02/20/13 122 lb 14.4 oz (55.747 kg)  Admit wt is 126 lb  Re-estimated needs:  Kcal: 1600 - 1800  Protein: 65 - 75 g  Fluid: 1.6 - 1.8 liters  Skin: no issues noted  Diet Order: General   Intake/Output Summary (Last 24 hours) at 02/27/13 1155 Last data filed at 02/27/13 0745  Gross per 24 hour  Intake    480 ml  Output      4 ml  Net    476 ml    Last BM: 11/30   Labs:   Recent Labs Lab 02/27/13 1040  NA 139  K 4.4  CL 101  CO2 31  BUN 14  CREATININE 0.82  CALCIUM 8.9  GLUCOSE 117*    CBG (last 3)  No results found for this basename: GLUCAP,  in the last 72 hours  Scheduled Meds: . amLODipine  2.5 mg Oral Daily  . atorvastatin  40 mg Oral q1800  . bisacodyl  10 mg Rectal Once  .  divalproex  250 mg Oral Daily  . feeding supplement (RESOURCE BREEZE)  1 Container Oral Q24H  . multivitamin with minerals  1 tablet Oral Daily  . Rivaroxaban  15 mg Oral Q supper  . senna-docusate  2 tablet Oral BID    Continuous Infusions:   Jarold Motto MS, RD, LDN Pager: [phone] After-hours pager: [phone]

## 2013-02-27 NOTE — Progress Notes (Signed)
Physical Therapy Session Note  Patient Details  Name: [name] MRN: [id number] Date of Birth: 04-Mar-1925  Today's Date: 02/27/2013 Time: 4098-1191 Time Calculation (min): 24 min  Short Term Goals: Week 2:  PT Short Term Goal 1 (Week 2): Pt will perform bed mobility with Mod A PT Short Term Goal 2 (Week 2): Pt will perform bed<>chair transfers with S PT Short Term Goal 3 (Week 2): Pt will ambulate 37' with Mod A and LRAD PT Short Term Goal 4 (Week 2): Pt will perform standing activity x69min with supervision PT Short Term Goal 5 (Week 2): Pt will ascend/descend 3 stairs with 1 rail and min A  Skilled Therapeutic Interventions/Progress Updates:   Pt received lying in bed this afternoon and agreeable to short session, however was notably fatigued during session.  Performed supine >sit at supervision level, however required assist for LLE into bed at end of session due to fatigue.  Focus of session was gait training for improved quality and endurance.  See details below.  Pt returned to room and performed seated marching x 20 reps and LAQ x 10 reps each with cues for increased knee extension and 3 second hold.  Pt returned to bed with all needs in reach and L arm propped slightly on pillow for increased comfort, bed alarm set.  Daughter also present during session.  Therapy Documentation Precautions:  Precautions Precautions: Fall Precaution Comments: NWB of LUE, sling to LUE Restrictions Weight Bearing Restrictions: No  Vital Signs: Therapy Vitals Temp: 98 F (36.7 C) Temp src: Oral Pulse Rate: 76 Resp: 17 BP: 136/77 mmHg Patient Position, if appropriate: Lying Oxygen Therapy SpO2: 96 % O2 Device: None (Room air) Pain: Pt with mild c/o pain in L shoulder with movement. Repositioned at end of session,.    Locomotion : Ambulation Ambulation: Yes Ambulation/Gait Assistance: 4: Min assist Ambulation Distance (Feet): 15 Feet (then another 30' x 1, and 10'x 1) Assistive device:  Hemi-walker Ambulation/Gait Assistance Details: Verbal cues for gait pattern;Verbal cues for precautions/safety;Tactile cues for weight shifting;Manual facilitation for weight shifting Ambulation/Gait Assistance Details: Pt instructed in gait training this pm with hemi walker at min assist level with continued verbal cues for correct sequencing/technique with hemi walker, increased step length with LLE and maintaining upright posture.  Also provided min facilitation for increased weight shift L for increased WB and also weight shift R for increased foot clearance.  Note as pt fatigues, her gait speed increases and safety decreases.  Pt with very limited endurance this afternoon and required several seated rest breaks.  Gait Gait: Yes Gait Pattern: Impaired Gait Pattern: Step-through pattern;Decreased stride length;Trunk flexed;Decreased dorsiflexion - left;Decreased step length - left   See FIM for current functional status  Therapy/Group: Individual Therapy  Vista Deck 02/27/2013, 3:54 PM

## 2013-02-27 NOTE — Progress Notes (Signed)
Physical Therapy Session Note  Patient Details  Name: [name] MRN: [id number] Date of Birth: [birth year]  Today's Date: 02/27/2013 Time: 4098-1191 Time Calculation (min): 45 min  Short Term Goals: Week 2:  PT Short Term Goal 1 (Week 2): Pt will perform bed mobility with Mod A PT Short Term Goal 2 (Week 2): Pt will perform bed<>chair transfers with S PT Short Term Goal 3 (Week 2): Pt will ambulate 20' with Mod A and LRAD PT Short Term Goal 4 (Week 2): Pt will perform standing activity x96min with supervision PT Short Term Goal 5 (Week 2): Pt will ascend/descend 3 stairs with 1 rail and min A  Skilled Therapeutic Interventions/Progress Updates:    Session focused on functional transfers from various surfaces including toileting, dynamic standing balance activities to reach outside BOS with RUE for functional task, gait training with HHA and hemiwalker (cues needed for safe use of hemiwalker (tending to keep in front of pt)), bed mobility, and seated LE therex (LAQ and heel/toe raises). Pt unable to finish session due to upset stomach, returned to bed and ended session 15 min early (RN aware).   Pt scheduled to have 60 min PT session immediately after as well and next therapist made aware.  Therapy Documentation Precautions:  Precautions Precautions: Fall Precaution Comments: NWB of LUE, sling to LUE Restrictions Weight Bearing Restrictions: No General: Amount of Missed PT Time (min): 15 Minutes Missed Time Reason: Patient ill (comment) Pain:  c/o upset stomach - RN aware and waiting on medication.  Locomotion : Ambulation Ambulation/Gait Assistance: 4: Min assist   See FIM for current functional status  Therapy/Group: Individual Therapy  Karolee Stamps [facility] 02/27/2013, 9:21 AM

## 2013-02-27 NOTE — Progress Notes (Signed)
[name] is a 79 y.o. 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 Admitted 02/12/2013 with new onset of left facial droop as well as left-sided weakness. Recent admit 02/05/2013 for recurrent left-sided weakness with MRI showing expected evolution of right corona radiata lentiform nuclei infarct which did occur in May no new acute abnormalities. Followup MRI 02/12/2013 again showing no evidence of acute infarct. Patient did not receive TPA. EEG negative for seizure. MRI lumbar spine showed no unilateral abnormality to explain left hemiplegia. Neurology followup patient remains on Xarelto for CVA prophylaxis workup indicated small subcortical infarct small vessel involvement not seen on MRI. Noted intermittent bouts of headache placed on low-dose Depakote Subjective/Complaints: Left clavicle her pain improved  Lightheaded when standing in PT/OT yesterday Poor appetite Epigastric discomfort started with breakfast today, no N/V/D   Objective: Vital Signs: Blood pressure 125/74, pulse 65, temperature 98.2 F (36.8 C), temperature source Oral, resp. rate 17, height 5\' 7"  (1.702 m), weight 55.747 kg (122 lb 14.4 oz), SpO2 98.00%. No results found. No results found for this or any previous visit (from the past 72 hour(s)).   HEENT: normal Cardio: irregular Resp: CTA B/L and unlabored GI: BS positive and NT,ND Extremity:  Pulses positive and No Edema Skin:   Intact Neuro: Alert/Oriented, Cranial Nerve II-XII normal, Abnormal Sensory reduced LT on L side, Abnormal Motor 5/5 on R and 4-/5 LUE, 3-/5 Left HF , KE, ankle DF/PF, no dysmetria in UE or LE , no evidence of nystagmus   Musc/Skel:  Tenderness over distal left clavicle GEN NAD   Assessment/Plan: 1. Functional deficits secondary to Right subcortical infarct with LLE>>LUE weakness which require 3+ hours per day of  interdisciplinary therapy in a comprehensive inpatient rehab setting. Physiatrist is providing close team supervision and 24 hour management of active medical problems listed below. Physiatrist and rehab team continue to assess barriers to discharge/monitor patient progress toward functional and medical goals. Original discharge date 12 2. Has new distal clavicular fracture. extension may be helpful. FIM: FIM - Bathing Bathing Steps Patient Completed: Chest;Left Arm;Abdomen;Front perineal area;Buttocks;Right upper leg;Left upper leg;Right Arm;Right lower leg (including foot);Left lower leg (including foot) (performed in sitting and standing from w/c) Bathing: 4: Steadying assist  FIM - Upper Body Dressing/Undressing Upper body dressing/undressing steps patient completed: Thread/unthread left sleeve of front closure shirt/dress;Thread/unthread right sleeve of front closure shirt/dress;Button/unbutton shirt Upper body dressing/undressing: 4: Min-Patient completed 75 plus % of tasks FIM - Lower Body Dressing/Undressing Lower body dressing/undressing steps patient completed: Thread/unthread right underwear leg;Thread/unthread left underwear leg;Thread/unthread right pants leg;Thread/unthread left pants leg Lower body dressing/undressing: 2: Max-Patient completed 25-49% of tasks  FIM - Toileting Toileting steps completed by patient: Adjust clothing prior to toileting;Adjust clothing after toileting Toileting Assistive Devices: Grab bar or rail for support Toileting: 3: Mod-Patient completed 2 of 3 steps  FIM - Diplomatic Services operational officer Devices: Elevated toilet seat;Grab bars Toilet Transfers: 4-From toilet/BSC: Min A (steadying Pt. > 75%);4-To toilet/BSC: Min A (steadying Pt. > 75%)  FIM - Bed/Chair Transfer Bed/Chair Transfer Assistive Devices: Bed rails Bed/Chair Transfer: 4: Supine > Sit: Min A (steadying Pt. > 75%/lift 1 leg);4: Sit > Supine: Min A (steadying pt. >  75%/lift 1 leg);3: Bed > Chair or W/C: Mod A (lift or lower assist);3: Chair or W/C > Bed: Mod A (lift or lower assist)  FIM - Locomotion: Wheelchair Distance: 40 Locomotion: Wheelchair: 1:  Total Assistance/staff pushes wheelchair (Pt<25%) FIM - Locomotion: Ambulation Locomotion: Ambulation Assistive Devices: Walker - Hemi Ambulation/Gait Assistance: 4: Min assist Locomotion: Ambulation: 1: Travels less than 50 ft with minimal assistance (Pt.>75%)  Comprehension Comprehension Mode: Auditory Comprehension: 6-Follows complex conversation/direction: With extra time/assistive device  Expression Expression Mode: Verbal Expression: 6-Expresses complex ideas: With extra time/assistive device  Social Interaction Social Interaction Mode: Asleep Social Interaction: 7-Interacts appropriately with others - No medications needed.  Problem Solving Problem Solving: 6-Solves complex problems: With extra time  Memory Memory: 6-More than reasonable amt of time  Medical Problem List and Plan:  1. Suspect small subcortical CVA not identified on MRI. Recent right lenticular nucleus infarct May 2014 with little residual  2. DVT Prophylaxis/Anticoagulation: Xarelto. Monitor for any bleeding episodes  3. Pain Management/left distal clavicular fracture, pain better discontinue Tylenol #3. This may be causing lightheadedness 4. Neuropsych: This patient is capable of making decisions on her own behalf.  5. Hypertension. Norvasc 2.5 mg daily. Monitor with increased mobility  6. Hyperlipidemia. Lipitor  7. Rate Controlled A Fib 8.  Clavicle fx per ortho, sling and nonweightbearing, followup in the office 3 weeks. I'll also ask her to use her hand as much as possible but not bear weight, maintain elbow ROM 9.  Epigastric discomfort suspect reflux , no SOB, will try mylanta 10.. Lightheadedness orthostatic BP, check BMET and CBC, D/C T#3 LOS (Days) 12 A FACE TO FACE EVALUATION WAS  PERFORMED  KIRSTEINS,ANDREW E 02/27/2013, 8:40 AM

## 2013-02-27 NOTE — Progress Notes (Signed)
Occupational Therapy Session Note  Patient Details  Name: [name] MRN: [id number] Date of Birth: 12-Sep-1924  Today's Date: 02/27/2013 Time: 1115-1200 Time Calculation (min): 45 min  Short Term Goals: Week 2:  OT Short Term Goal 1 (Week 2): Pt will be able to transfer to and from [facility] and or toilet with min assist. OT Short Term Goal 2 (Week 2): Pt will be able to don shirt with min assist to doff over L arm. OT Short Term Goal 3 (Week 2): Pt will be able to don underwear and pants with min assist. OT Short Term Goal 4 (Week 2): Pt will be able to bathe self with min assist.  Skilled Therapeutic Interventions: ADL-retraining with emphasis on adapted bathing and dressing skills, transfers, and improved endurance.   Patient completed toilet transfer with min assist using grab bars with verbal cues for safety.   Patient ambulated from toilet to sink using hemi-walker with close supervision and verbal cues for placement of hemi-walker.   With daughter present, patient completed upper body bathing with min assist to manage L-UE but required mod assist for upper body dressing and max assist for lower body dressing due to impaired standing balance.   Patient completed 4 sit>stand at sink during bathing/dressing tasks with decreased strength with each successive attempt due to general fatigue and weakness.   Patient tolerates approx 30 min of ADL before decline in functional skills becomes evident but she fails quickly when fatigued although she is aware of her endurance limitations and seeks assist, as needed.     Therapy Documentation Precautions:  Precautions Precautions: Fall Precaution Comments: NWB of LUE, sling to LUE Restrictions Weight Bearing Restrictions: No  Vital Signs: Therapy Vitals BP: 130/84 mmHg Patient Position, if appropriate: Lying  Pain: Pain Assessment Faces Pain Scale: Hurts little more Pain Type: Acute pain Pain Location: Shoulder Pain Orientation: Left Pain  Descriptors / Indicators: Aching Pain Onset: With Activity Pain Intervention(s): Repositioned   See FIM for current functional status  Therapy/Group: Individual Therapy  Esaiah Wanless 02/27/2013, 12:48 PM

## 2013-02-27 NOTE — Progress Notes (Signed)
Late entry for 0815: Nurse called to room by pt's daughter. Daughter reported nausea, and pt c/o of heartburn. Visible sputum noted in basin. Dr. Doroteo Bradford made aware, new orders received.

## 2013-02-28 ENCOUNTER — Inpatient Hospital Stay (HOSPITAL_COMMUNITY): Payer: Medicare Other | Admitting: Occupational Therapy

## 2013-02-28 ENCOUNTER — Inpatient Hospital Stay (HOSPITAL_COMMUNITY): Payer: Medicare Other

## 2013-02-28 LAB — OCCULT BLOOD X 1 CARD TO LAB, STOOL: Fecal Occult Bld: POSITIVE — AB

## 2013-02-28 NOTE — Progress Notes (Signed)
Social Work Patient ID: [name], female   DOB: May 26, 1924, 79 y.o.   MRN: [id number] Spoke with Kim-daughter to discuss team conference results. She was agreeable to the plan and feels pt will be more comfortable at home and eat better. She reports her sister [name] needs the most education due to not used to taking care of people.  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 Will work toward discharge Sat.  Prefers [facility] due to had them before with Mom.

## 2013-02-28 NOTE — Progress Notes (Addendum)
Physical Therapy Note  Patient Details  Name: [name] MRN: [id number] Date of Birth: October 14, 1924 Today's Date: 02/28/2013  No pain reported  1350-1445 55 min individual therapy  Daughter Kit present and trained in bd mobility, basic transfers, car transfers, and gait; daughter observed therapist assisting pt up/down 1 step.   Supine > sit with supervision, without cues for technique, to R side of bed.  Bed> w/c with min guard assist, to L. Pt required cues to sit and wait on EOB before attempting to walk.  Car transfers x 2 with min assist, mod cues for safety.  Up/down 1 step with therapist, min assist, mod VCs for safe use of HW; pt too fatigued to repeat with Kit.  Therapist simulated step management with daughter.  Furniture transfer to sofa in family room, min guard assist.  Pt reported she had problems before her first CVa with hypotension in AM, and cardiologist had cautioned her not to shower in AM.  Pt and Kit reported that pt  had had numerous falls in shower before prior CVA in May.  Discussed with pt and Kit set-up for stairs to 2nd floor where pt's bedroom is (and 2 daughters rooms as well): up 2 steps, landing, then 10 steps.  2 steps have railing on R ascending; family is planning to have chair lift installed for  10- step section.  Gait x 15' with daughter, HW min guard assist, and transfer into bed with min.  Pt sit> supine with extra time, supervision without cues.  Bed alarm set; all needs left in place.  Laquan Ludden 02/28/2013, 2:26 PM

## 2013-02-28 NOTE — Progress Notes (Signed)
Social Work Patient ID: [name], female   DOB: [birth year], 79 y.o.   MRN: [id number] Met with pt and Kit-daughter to inform team cofnerence goals min level and discharge 12/6.  Aware team's recommendation is 24 hour care and family education is  Needed the next few days to prepare for discharge.  Pt felt good about this date and wants to make sure her daughter's feel as good.  She wants to get home. Asked that [name] be contacted To inform of team conference.  Wor toward discharge date.

## 2013-02-28 NOTE — Progress Notes (Signed)
Patient's daughter Kit ambulating patient to bathroom without staff assist, discussed with her the need to be checked off by therapy for transfers and ambulating to bathroom to insure correct technique being used by family member and that at next session at 1345 would speak to therapist to see if that would be possible to do. Discussed that each family member will need to be checked off before performing transfers and ambulation with patient.Bed alarm on. Both patient and daughter verbalized understanding, Harvel Ricks, PA made aware of above.  Roberts-VonCannon, Ryann Pauli Elon Jester

## 2013-02-28 NOTE — Telephone Encounter (Signed)
Form also faxed to [facility] Dept at [phone].

## 2013-02-28 NOTE — Progress Notes (Signed)
Occupational Therapy Session Note  Patient Details  Name: [name] MRN: [id number] Date of Birth: 1925-03-01  Today's Date: 02/28/2013 Time: 0805-0900 Time Calculation (min): 55 min  Short Term Goals: Week 1:  OT Short Term Goal 1 (Week 1): Pt will demonstrate improved standing balance to stand with min assist as she pulls pants up/down hips. OT Short Term Goal 1 - Progress (Week 1): Partly met (as of 02/21/13, pt had met goal. Now fearful after fall.  Continue goal.) OT Short Term Goal 2 (Week 1): Pt will be able to transfer to elevated toilet seat with min assist from w/c. OT Short Term Goal 2 - Progress (Week 1): Partly met (as of 02/21/13, pt met goal. Now fearful after fall, continue to work on goal.) OT Short Term Goal 3 (Week 1): Pt will be able to transfer to shower seat with grab bars with min assist. OT Short Term Goal 3 - Progress (Week 1): Partly met (She had met this goal as of 11/26, but had a fall in shower that day. Need to reassess.) OT Short Term Goal 4 (Week 1): Pt's LUE strength will improve from a 3+/5 to a 4/5 to enable her to use her arm more actively with transfers. OT Short Term Goal 4 - Progress (Week 1): Partly met (She had met this goal as of 11/26.  Now with clavicular fx inhibiting movement. ) Week 2:  OT Short Term Goal 1 (Week 2): Pt will be able to transfer to and from [facility] and or toilet with min assist. OT Short Term Goal 2 (Week 2): Pt will be able to don shirt with min assist to doff over L arm. OT Short Term Goal 3 (Week 2): Pt will be able to don underwear and pants with min assist. OT Short Term Goal 4 (Week 2): Pt will be able to bathe self with min assist.      Skilled Therapeutic Interventions/Progress Updates:      Pt seen for BADL retraining of toileting, bathing, and dressing with a focus on functional mobility and adaptive techniques.  Pt stated that her shoulder pain has resolved and it is not a major problem for her now. She ambulated to  toilet with HW with close supervision and completed toileting.  Pt was fatigued and dizzy after toileting as she was straining.  Pt then needed steady assist to transfer back to w/c. She opted to bathe at sink. She was able to wash her entire UB using L hand to wash R arm and stood with very close supervision as she bathed perineal area and buttocks.  She donned button up shirt with min assist and was able to don LB clothing over feet by crossing legs. She needed a little assist to pull briefs over L hip but was then able to pull her pants up without A. She donned nonslip socks with out A after Ted hose were applied. Pt continues to have very low activity tolerance for standing (only 15 to 30 seconds), but is unable to state why.  She ambulated to her recliner to rest in. Pt had call light and phone in reach.    Therapy Documentation Precautions:  Precautions Precautions: Fall Precaution Comments: NWB of LUE, sling to LUE Restrictions Weight Bearing Restrictions: No    Vital Signs: Therapy Vitals Pulse Rate: 108 BP: 116/76 mmHg Patient Position, if appropriate: Standing Pain: Pain Assessment Pain Assessment: No/denies pain ADL:  See FIM for current functional status  Therapy/Group: Individual Therapy  Adaora Mchaney 02/28/2013, 11:26 AM

## 2013-02-28 NOTE — Patient Care Conference (Signed)
Inpatient RehabilitationTeam Conference and Plan of Care Update Date: 02/28/2013   Time: 10;50 Am    Patient Name: [name]      Medical Record Number: [id number]  Date of Birth: 1924/08/08 Sex: Female         Room/Bed: 4M07C/4M07C-01 Payor Info: Payor: MEDICARE / Plan: MEDICARE PART A AND B / Product Type: *No Product type* /    Admitting Diagnosis: R CVA  Admit Date/Time:  02/15/2013  6:11 PM Admission Comments: No comment available   Primary Diagnosis:  Subcortical infarction Principal Problem: Subcortical infarction  Patient Active Problem List   Diagnosis Date Noted  . Subcortical infarction 02/16/2013  . Possible Seizures 02/14/2013  . TIA (transient ischemic attack) 02/12/2013  . Stroke-like symptom 02/12/2013  . Hyperglycemia 02/12/2013  . High cholesterol   . CVA (cerebral vascular accident) 02/05/2013  . Left leg weakness 02/05/2013  . Atrial fibrillation 02/05/2013  . Chronic anticoagulation 02/05/2013  . HTN (hypertension) 11/14/2012  . right brain stroke too small to be seen on MRI 08/02/2012  . Syncope 06/01/2012  . Right ureteral stone 09/15/2010  . Shoulder pain 08/19/2010  . Atrial flutter 07/17/2010  . Orthostatic hypotension 07/17/2010  . OSTEOPENIA 06/03/2009  . MIGRAINE HEADACHE 02/05/2008  . CATARACTS, BILATERAL 02/05/2008  . DECREASED HEARING 02/05/2008  . GEN OSTEOARTHROSIS INVOLVING MULTIPLE SITES 02/05/2008  . COLONIC POLYPS, HX OF 02/05/2008    Expected Discharge Date: Expected Discharge Date: 03/03/13  Team Members Present: Physician leading conference: Dr. Claudette Laws Social Worker Present: Staci Acosta, LCSW;Becky Jalan Bodi, LCSW Nurse Present: Gregor Hams, RN PT Present: Wanda Plump, PT OT Present: Scherrie November, Felipa Eth, OT SLP Present: Maxcine Ham, SLP PPS Coordinator present : Edson Snowball, Chapman Fitch, RN, CRRN     Current Status/Progress Goal Weekly Team Focus  Medical   distal clavicular  fracture is less painful  re calibrate goals with limited LUE fxn  maintain ROM at elbow and wrist, manage bowels   Bowel/Bladder   cont of bowel and bladder. LBM 11/30 on scheduled bowel meds  remain cont of bowel and bladder  monitor for diarrhea/constipation   Swallow/Nutrition/ Hydration     WFL        ADL's   steady assist with bathing in standing, Min assist to don button up shirt with large buttons, min assist with LB dressing only to pulll briefs over L hip, set up with grooming and to don Ted hose and arm sling  Goals modified 03/27/13 from supervision to min assist due to change in medical status  ADL retraining, pt and family education to prepare for discharge   Mobility   min assist bed and transfers, gait up to 30'     activity tolerance,transfers, gait   Communication     [facility]        Safety/Cognition/ Behavioral Observations  pt in use of bed alarm when in bed, uses call bell appropriately. ambulates with hemiwalker hand held assist; pt shows no unsafe behaviors. SRx3  no further falls while on rehab unit  contiune bed alarm and reenforce call bell, SRx3   Pain   PRN tylenol 650mg  before therapy; refuses tylenol 3 due to codeine. pain on L shoulder  Pain level 3 or less on a scale of 0-10.  assess pain and pre-medicate before therapy as needed   Skin   CDI; mild bruising on L shoulder  No new skin breakdown/infection.  monitor skin qshift and prn      *See Care  Plan and progress notes for long and short-term goals.  Barriers to Discharge: see above    Possible Resolutions to Barriers:  see above    Discharge Planning/Teaching Needs:  Home with daughter's will make sure someone is with 24 hour, aware of recommendation of staying on first level.      Team Discussion:  Pian limits pt in therapies, but making progress.  Goals now min assist due to arm in sling for ADL's and for safety when up ambulating.  Activity tolerance poor.  Poor po intake-family and staffing  pushing po's.  Need family education form d/c Sat.  Revisions to Treatment Plan:  New d/c date set for 12/6 revised goals since fall to min assist level   Continued Need for Acute Rehabilitation Level of Care: The patient requires daily medical management by a physician with specialized training in physical medicine and rehabilitation for the following conditions: Daily direction of a multidisciplinary physical rehabilitation program to ensure safe treatment while eliciting the highest outcome that is of practical value to the patient.: Yes Daily medical management of patient stability for increased activity during participation in an intensive rehabilitation regime.: Yes Daily analysis of laboratory values and/or radiology reports with any subsequent need for medication adjustment of medical intervention for : Neurological problems;Other  Tenelle Andreason, Lemar Livings 03/01/2013, 12:19 PM

## 2013-02-28 NOTE — Progress Notes (Signed)
[name] is a 79 y.o. 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 Admitted 02/12/2013 with new onset of left facial droop as well as left-sided weakness. Recent admit 02/05/2013 for recurrent left-sided weakness with MRI showing expected evolution of right corona radiata lentiform nuclei infarct which did occur in May no new acute abnormalities. Followup MRI 02/12/2013 again showing no evidence of acute infarct. Patient did not receive TPA. EEG negative for seizure. MRI lumbar spine showed no unilateral abnormality to explain left hemiplegia. Neurology followup patient remains on Xarelto for CVA prophylaxis workup indicated small subcortical infarct small vessel involvement not seen on MRI. Noted intermittent bouts of headache placed on low-dose Depakote Subjective/Complaints: Left clavicle her pain improved  Feels better  appetite improving 25-100%  no N/V/D, no epigastric pain   Objective: Vital Signs: Blood pressure 116/76, pulse 108, temperature 97.8 F (36.6 C), temperature source Oral, resp. rate 17, height 5\' 7"  (1.702 m), weight 52.345 kg (115 lb 6.4 oz), SpO2 98.00%. No results found. Results for orders placed during the hospital encounter of 02/15/13 (from the past 72 hour(s))  CBC     Status: Abnormal   Collection Time    02/27/13 10:40 AM      Result Value Range   WBC 6.2  4.0 - 10.5 K/uL   RBC 3.87  3.87 - 5.11 MIL/uL   Hemoglobin 11.7 (*) 12.0 - 15.0 g/dL   HCT 16.1 (*) 09.6 - 04.5 %   MCV 90.4  78.0 - 100.0 fL   MCH 30.2  26.0 - 34.0 pg   MCHC 33.4  30.0 - 36.0 g/dL   RDW 40.9  81.1 - 91.4 %   Platelets 173  150 - 400 K/uL  BASIC METABOLIC PANEL     Status: Abnormal   Collection Time    02/27/13 10:40 AM      Result Value Range   Sodium 139  135 - 145 mEq/L   Potassium 4.4  3.5 - 5.1 mEq/L   Chloride 101  96 - 112 mEq/L   CO2 31  19 - 32 mEq/L   Glucose, Bld 117 (*) 70 - 99 mg/dL   BUN 14  6 - 23 mg/dL   Creatinine, Ser 7.82  0.50 - 1.10 mg/dL   Calcium 8.9  8.4 - 95.6 mg/dL   GFR calc non Af Amer 63 (*) >90 mL/min   GFR calc Af Amer 73 (*) >90 mL/min   Comment: (NOTE)     The eGFR has been calculated using the CKD EPI equation.     This calculation has not been validated in all clinical situations.     eGFR's persistently <90 mL/min signify possible Chronic Kidney     Disease.     HEENT: normal Cardio: irregular Resp: CTA B/L and unlabored GI: BS positive and NT,ND Extremity:  Pulses positive and No Edema Skin:   Intact Neuro: Alert/Oriented, Cranial Nerve II-XII normal, Abnormal Sensory reduced LT on L side, Abnormal Motor 5/5 on R and 4-/5 LUE, 3-/5 Left HF , KE, ankle DF/PF, no dysmetria in UE or LE , no evidence of nystagmus   Musc/Skel:  Tenderness over distal left clavicle GEN NAD   Assessment/Plan: 1. Functional deficits secondary to Right subcortical infarct with LLE>>LUE weakness which require 3+ hours per day of interdisciplinary therapy in a comprehensive inpatient rehab setting. Physiatrist is providing close team supervision and 24 hour  management of active medical problems listed below. Physiatrist and rehab team continue to assess barriers to discharge/monitor patient progress toward functional and medical goals. Team conference today please see physician documentation under team conference tab, met with team face-to-face to discuss problems,progress, and goals. Formulized individual treatment plan based on medical history, underlying problem and comorbidities. FIM: FIM - Bathing Bathing Steps Patient Completed: Chest;Left Arm;Abdomen;Front perineal area;Buttocks;Right upper leg;Left upper leg;Right lower leg (including foot);Left lower leg (including foot) Bathing: 4: Min-Patient completes 8-9 57f 10 parts or 75+ percent  FIM - Upper Body Dressing/Undressing Upper body dressing/undressing steps patient  completed: Thread/unthread right sleeve of pullover shirt/dresss;Put head through opening of pull over shirt/dress Upper body dressing/undressing: 3: Mod-Patient completed 50-74% of tasks FIM - Lower Body Dressing/Undressing Lower body dressing/undressing steps patient completed: Thread/unthread right underwear leg;Thread/unthread left underwear leg;Thread/unthread right pants leg;Thread/unthread left pants leg Lower body dressing/undressing: 2: Max-Patient completed 25-49% of tasks  FIM - Toileting Toileting steps completed by patient: Adjust clothing prior to toileting;Adjust clothing after toileting Toileting Assistive Devices: Grab bar or rail for support Toileting: 3: Mod-Patient completed 2 of 3 steps  FIM - Diplomatic Services operational officer Devices: Elevated toilet seat;Grab bars Toilet Transfers: 4-From toilet/BSC: Min A (steadying Pt. > 75%);4-To toilet/BSC: Min A (steadying Pt. > 75%)  FIM - Bed/Chair Transfer Bed/Chair Transfer Assistive Devices: Bed rails Bed/Chair Transfer: 5: Supine > Sit: Supervision (verbal cues/safety issues);4: Chair or W/C > Bed: Min A (steadying Pt. > 75%);4: Bed > Chair or W/C: Min A (steadying Pt. > 75%)  FIM - Locomotion: Wheelchair Distance: 40 Locomotion: Wheelchair: 1: Total Assistance/staff pushes wheelchair (Pt<25%) FIM - Locomotion: Ambulation Locomotion: Ambulation Assistive Devices: Chief Operating Officer Ambulation/Gait Assistance: 4: Min assist Locomotion: Ambulation: 1: Travels less than 50 ft with minimal assistance (Pt.>75%)  Comprehension Comprehension Mode: Auditory Comprehension: 6-Follows complex conversation/direction: With extra time/assistive device  Expression Expression Mode: Verbal Expression: 6-Expresses complex ideas: With extra time/assistive device  Social Interaction Social Interaction Mode: Asleep Social Interaction: 7-Interacts appropriately with others - No medications needed.  Problem Solving Problem  Solving: 6-Solves complex problems: With extra time  Memory Memory: 6-More than reasonable amt of time  Medical Problem List and Plan:  1. Suspect small subcortical CVA not identified on MRI. Recent right lenticular nucleus infarct May 2014 with little residual  2. DVT Prophylaxis/Anticoagulation: Xarelto. Monitor for any bleeding episodes  3. Pain Management/left distal clavicular fracture, pain better on regular Tylenol . This may be causing lightheadedness 4. Neuropsych: This patient is capable of making decisions on her own behalf.  5. Hypertension. Norvasc 2.5 mg daily. Monitor with increased mobility  6. Hyperlipidemia. Lipitor  7. Rate Controlled A Fib 8.  Clavicle fx per ortho, sling and nonweightbearing, followup in the office 3 weeks. I'll also ask her to use her hand as much as possible but not bear weight, maintain elbow ROM 9.  Epigastric discomfort suspect reflux , no SOB, will try mylanta and protonix 10.. Lightheadedness orthostatic BP,  BMET and CBC ok,improved off  T#3 LOS (Days) 13 A FACE TO FACE EVALUATION WAS PERFORMED  KIRSTEINS,ANDREW E 02/28/2013, 9:02 AM

## 2013-02-28 NOTE — Progress Notes (Signed)
Occupational Therapy Session Note  Patient Details  Name: NYANNA HEIDEMAN MRN: [id number] Date of Birth: 1924-11-29  Today's Date: 02/28/2013 Time: 1000-1045 and 1510-1540 Time Calculation (min): 45 min and 30 min  Short Term Goals: Week 2:  OT Short Term Goal 1 (Week 2): Pt will be able to transfer to and from [facility] and or toilet with min assist. OT Short Term Goal 2 (Week 2): Pt will be able to don shirt with min assist to doff over L arm. OT Short Term Goal 3 (Week 2): Pt will be able to don underwear and pants with min assist. OT Short Term Goal 4 (Week 2): Pt will be able to bathe self with min assist.  Skilled Therapeutic Interventions/Progress Updates:  1)  Patient resting in bed upon arrival.  Engaged in bed mobility, activity tolerance, gaze stabilization visual exercises performed seated EOB then seated in recliner for supported sitting, stand step transfers.  Patient was close supervision-steady assist for bed><recliner transfers secondary to declined to use the hemi walker and performed 'furniture walking'.  Monitored patients reported dizziness/nausea scores during session: with simple functional mobility 2/10-4/10, 5 sets of gaze stabilization exercises for 1 minute each set patient varied from 3/10-7/10 and required 20-60 seconds to recover back down to a 3/10 report before begin next set of exercises.  Patient requires max cues to perform the exercises with larger movements right and left and with increased speed therefore unable to reach 7/10 score often.  Patient requested back to bed after session, alarm activated and all items within reach.  2)  Patient resting in bed upon arrival.  Engaged in bed mobility, transfer to recliner with hemi walker, seated gaze stabilization exercises, ambulate to/from bathroom with hemi walker.  Focused session on activity tolerance, sit><stands, minimizing dizzy and nausea episodes, dynamic balance and safe toilet transfers and toileting.  Patient  reports 3/10 after 3/3 sets of head knods (up/down) each lasting 1 minute.  Therefore, performed 1 set of right-left head turns for 1 minute and patient still only reports 3/10.  Patient's technique and speed improves as she practices.  After toilett transfer, patient requested back to bed with bed alarm engaged and all items in place.  Therapy Documentation Precautions:  Precautions Precautions: Fall Precaution Comments: NWB of LUE, sling to LUE Restrictions Weight Bearing Restrictions: No Pain: No c/o pain in either session ADL: See FIM for current functional status  Therapy/Group: Individual Therapy both sessions  Yichen Gilardi 02/28/2013, 12:36 PM

## 2013-02-28 NOTE — Progress Notes (Signed)
Patient complains of feeling stool in rectum but unable to pass, external hemorrhoids seen on assessment of patient, patient checked for impaction, hard stool felt in rectal vault, patient disimpacted for moderate amount of hard brown stool then patient able to pass soft formed brown stool on her own. Patient received Senokot -s this am at 0600. Patient tolerated manual removal of stool well, stated" you have helped me out thank you, sorry you had to do that" assured patient it was ok, glad she is feeling better now. Roberts-VonCannon, Ronen Bromwell Elon Jester

## 2013-03-01 ENCOUNTER — Inpatient Hospital Stay (HOSPITAL_COMMUNITY): Payer: Medicare Other | Admitting: *Deleted

## 2013-03-01 ENCOUNTER — Inpatient Hospital Stay (HOSPITAL_COMMUNITY): Payer: Medicare Other | Admitting: Occupational Therapy

## 2013-03-01 ENCOUNTER — Encounter (HOSPITAL_COMMUNITY): Payer: Medicare Other

## 2013-03-01 ENCOUNTER — Inpatient Hospital Stay (HOSPITAL_COMMUNITY): Payer: Medicare Other

## 2013-03-01 DIAGNOSIS — I6330 Cerebral infarction due to thrombosis of unspecified cerebral artery: Secondary | ICD-10-CM

## 2013-03-01 DIAGNOSIS — G8110 Spastic hemiplegia affecting unspecified side: Secondary | ICD-10-CM

## 2013-03-01 DIAGNOSIS — I6350 Cerebral infarction due to unspecified occlusion or stenosis of unspecified cerebral artery: Secondary | ICD-10-CM

## 2013-03-01 DIAGNOSIS — Z0279 Encounter for issue of other medical certificate: Secondary | ICD-10-CM

## 2013-03-01 LAB — OCCULT BLOOD X 1 CARD TO LAB, STOOL
Fecal Occult Bld: NEGATIVE
Fecal Occult Bld: NEGATIVE

## 2013-03-01 MED ORDER — DOCUSATE SODIUM 100 MG PO CAPS
100.0000 mg | ORAL_CAPSULE | Freq: Every day | ORAL | Status: DC
  Administered 2013-03-03: 100 mg via ORAL
  Filled 2013-03-01 (×4): qty 1

## 2013-03-01 NOTE — Progress Notes (Signed)
Physical Therapy Session Note  Patient Details  Name: [name] MRN: [id number] Date of Birth: 23-Apr-1924  Today's Date: 03/01/2013 Time: 1030-1055 Time Calculation (min): 25 min  Short Term Goals: Week 2:  PT Short Term Goal 1 (Week 2): Pt will perform bed mobility with Mod A PT Short Term Goal 2 (Week 2): Pt will perform bed<>chair transfers with S PT Short Term Goal 3 (Week 2): Pt will ambulate 69' with Mod A and LRAD PT Short Term Goal 4 (Week 2): Pt will perform standing activity x41min with supervision PT Short Term Goal 5 (Week 2): Pt will ascend/descend 3 stairs with 1 rail and min A  Skilled Therapeutic Interventions/Progress Updates:   Session focused on functional transfers including simulated car transfer with hemiwalker (pt able to recall correct sequencing and technique independently), toilet transfers, and chair -> bed transfers as well as short distance gait with hemiwalker with steady A. Pt overall close S to steady A with mobility and able to correctly verbalize set-up and technique for all transfers as if I was her caregiver. Repositioned in bed with pillows for comfort and positioning at end of session to rest before next session.  Therapy Documentation Precautions:  Precautions Precautions: Fall Precaution Comments: NWB of LUE, sling to LUE Restrictions Weight Bearing Restrictions: Yes LUE Weight Bearing: Non weight bearing  Pain: Denies pain, complains of fatigue.  See FIM for current functional status  Therapy/Group: Individual Therapy  Karolee Stamps [facility] 03/01/2013, 12:08 PM

## 2013-03-01 NOTE — Progress Notes (Signed)
Occupational Therapy Session Note  Patient Details  Name: [name] MRN: [id number] Date of Birth: Jun 25, 1924  Today's Date: 03/01/2013 Time: 1330-1400 Time Calculation (min): 30 min  Short Term Goals: Week 1:  OT Short Term Goal 1 (Week 1): Pt will demonstrate improved standing balance to stand with min assist as she pulls pants up/down hips. OT Short Term Goal 1 - Progress (Week 1): Partly met (as of 02/21/13, pt had met goal. Now fearful after fall.  Continue goal.) OT Short Term Goal 2 (Week 1): Pt will be able to transfer to elevated toilet seat with min assist from w/c. OT Short Term Goal 2 - Progress (Week 1): Partly met (as of 02/21/13, pt met goal. Now fearful after fall, continue to work on goal.) OT Short Term Goal 3 (Week 1): Pt will be able to transfer to shower seat with grab bars with min assist. OT Short Term Goal 3 - Progress (Week 1): Partly met (She had met this goal as of 11/26, but had a fall in shower that day. Need to reassess.) OT Short Term Goal 4 (Week 1): Pt's LUE strength will improve from a 3+/5 to a 4/5 to enable her to use her arm more actively with transfers. OT Short Term Goal 4 - Progress (Week 1): Partly met (She had met this goal as of 11/26.  Now with clavicular fx inhibiting movement. )  Week 2:  OT Short Term Goal 1 (Week 2): Pt will be able to transfer to and from [facility] and or toilet with min assist. OT Short Term Goal 2 (Week 2): Pt will be able to don shirt with min assist to doff over L arm. OT Short Term Goal 3 (Week 2): Pt will be able to don underwear and pants with min assist. OT Short Term Goal 4 (Week 2): Pt will be able to bathe self with min assist.  Skilled Therapeutic Interventions/Progress Updates:  Patient found supine in bed with daughter, Kit in the room. Patient with no complaints of pain. Patient willing to participate with therapy. Patient engaged in bed mobility, sat edge of bed, then ambulated > bathroom for toilet transfer on/off  BSC seated over elevated toilet seat. Kit assisted patient with functional ambulation using hemi walker and toilet transfer. Therapist checked Kit off on safety plan for her to assist patient <> BR prn. Kit then propelled patient > ADL apartment and patient performed shower stall transfer using hemi walker, simulated shower block, and shower seat. Therapist performed transfer with patient first time, then Kit and patient return demonstrated (teach back method).  Patient has a built in shower seat at home that she plans on using. Recommending BSC for use in their home downstairs bathroom. Kit provides the appropriate verbal cues to patient and is able to safely assist patient with transfers and functional mobility using hemi walker. Kit assisted patient back to her room.   Precautions:  Precautions Precautions: Fall Precaution Comments: NWB of LUE, sling to LUE Restrictions Weight Bearing Restrictions: Yes LUE Weight Bearing: Non weight bearing  See FIM for current functional status  Therapy/Group: Individual Therapy  Hilbert Briggs 03/01/2013, 3:13 PM

## 2013-03-01 NOTE — Progress Notes (Signed)
Occupational Therapy Session Note  Patient Details  Name: [name] MRN: [id number] Date of Birth: 1925/03/19  Today's Date: 03/01/2013 Time: 0930-1030 Time Calculation (min): 60 min  Short Term Goals: Week 1:  OT Short Term Goal 1 (Week 1): Pt will demonstrate improved standing balance to stand with min assist as she pulls pants up/down hips. OT Short Term Goal 1 - Progress (Week 1): Partly met (as of 02/21/13, pt had met goal. Now fearful after fall.  Continue goal.) OT Short Term Goal 2 (Week 1): Pt will be able to transfer to elevated toilet seat with min assist from w/c. OT Short Term Goal 2 - Progress (Week 1): Partly met (as of 02/21/13, pt met goal. Now fearful after fall, continue to work on goal.) OT Short Term Goal 3 (Week 1): Pt will be able to transfer to shower seat with grab bars with min assist. OT Short Term Goal 3 - Progress (Week 1): Partly met (She had met this goal as of 11/26, but had a fall in shower that day. Need to reassess.) OT Short Term Goal 4 (Week 1): Pt's LUE strength will improve from a 3+/5 to a 4/5 to enable her to use her arm more actively with transfers. OT Short Term Goal 4 - Progress (Week 1): Partly met (She had met this goal as of 11/26.  Now with clavicular fx inhibiting movement. ) Week 2:  OT Short Term Goal 1 (Week 2): Pt will be able to transfer to and from [facility] and or toilet with min assist. OT Short Term Goal 2 (Week 2): Pt will be able to don shirt with min assist to doff over L arm. OT Short Term Goal 3 (Week 2): Pt will be able to don underwear and pants with min assist. OT Short Term Goal 4 (Week 2): Pt will be able to bathe self with min assist.  Skilled Therapeutic Interventions/Progress Updates:    Pt seen for BADL retraining of B/D at sink level (Pt choice) with a focus on activity tolerance, standing balance, and adaptive techniques.  Pt completed all of her self care in a faster and more efficient manor as she was feeling stronger and  more energetic.  Pt was able to bathe her entire body only with very close supervision in standing to wash her bottom. She was able to don shirt with slight assist to bring shirt around her back and she needed assist to pull pants over left hip as they were tighter than the pair she wore yesterday.  Once self care was complete, pt engaged in 10 min of gaze stabilization exercises. She was able to complete 45 sec of horizontal and 45 sec of vertical head movements for 4 sets with minimal dizziness. She then worked on L Geneticist, molecular with soft theraputty. Pt resting in chair with call light in reach at end of session.  Therapy Documentation Precautions:  Precautions Precautions: Fall Precaution Comments: NWB of LUE, sling to LUE Restrictions Weight Bearing Restrictions: Yes LUE Weight Bearing: Non weight bearing    Vital Signs: Therapy Vitals Pulse Rate: 92 BP: 101/64 mmHg Patient Position, if appropriate: Sitting Pain: Pain Assessment Pain Assessment: No/denies pain ADL:  See FIM for current functional status  Therapy/Group: Individual Therapy  [name] 03/01/2013, 10:48 AM

## 2013-03-01 NOTE — Progress Notes (Signed)
Physical Therapy Note  Patient Details  Name: [name] MRN: [id number] Date of Birth: 06/12/1924 Today's Date: 03/01/2013  Patient missed 30 minutes of skilled physical therapy this PM secondary to refusal to participate. Patient received supine in bed, asleep. Upon arousal, patient states "I've already had six sessions today. I get too tired. I can't get up." Encouraged patient to participate in bed level exercises, but patient continues to refuse. Will follow up as able.   [name], PT, DPT 03/01/2013, 3:43 PM

## 2013-03-01 NOTE — Progress Notes (Signed)
[name] is a 79 y.o. 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 Admitted 02/12/2013 with new onset of left facial droop as well as left-sided weakness. Recent admit 02/05/2013 for recurrent left-sided weakness with MRI showing expected evolution of right corona radiata lentiform nuclei infarct which did occur in May no new acute abnormalities. Followup MRI 02/12/2013 again showing no evidence of acute infarct. Patient did not receive TPA. EEG negative for seizure. MRI lumbar spine showed no unilateral abnormality to explain left hemiplegia. Neurology followup patient remains on Xarelto for CVA prophylaxis workup indicated small subcortical infarct small vessel involvement not seen on MRI. Noted intermittent bouts of headache placed on low-dose Depakote Subjective/Complaints: Left clavicle her pain improved  Feels better C/o loose stools  no N/V/D, no epigastric pain   Objective: Vital Signs: Blood pressure 101/64, pulse 92, temperature 97.8 F (36.6 C), temperature source Oral, resp. rate 18, height 5\' 7"  (1.702 m), weight 52.345 kg (115 lb 6.4 oz), SpO2 93.00%. No results found. Results for orders placed during the hospital encounter of 02/15/13 (from the past 72 hour(s))  CBC     Status: Abnormal   Collection Time    02/27/13 10:40 AM      Result Value Range   WBC 6.2  4.0 - 10.5 K/uL   RBC 3.87  3.87 - 5.11 MIL/uL   Hemoglobin 11.7 (*) 12.0 - 15.0 g/dL   HCT 40.9 (*) 81.1 - 91.4 %   MCV 90.4  78.0 - 100.0 fL   MCH 30.2  26.0 - 34.0 pg   MCHC 33.4  30.0 - 36.0 g/dL   RDW 78.2  95.6 - 21.3 %   Platelets 173  150 - 400 K/uL  BASIC METABOLIC PANEL     Status: Abnormal   Collection Time    02/27/13 10:40 AM      Result Value Range   Sodium 139  135 - 145 mEq/L   Potassium 4.4  3.5 - 5.1 mEq/L   Chloride 101  96 - 112 mEq/L   CO2 31  19 - 32 mEq/L   Glucose, Bld  117 (*) 70 - 99 mg/dL   BUN 14  6 - 23 mg/dL   Creatinine, Ser 0.86  0.50 - 1.10 mg/dL   Calcium 8.9  8.4 - 57.8 mg/dL   GFR calc non Af Amer 63 (*) >90 mL/min   GFR calc Af Amer 73 (*) >90 mL/min   Comment: (NOTE)     The eGFR has been calculated using the CKD EPI equation.     This calculation has not been validated in all clinical situations.     eGFR's persistently <90 mL/min signify possible Chronic Kidney     Disease.  OCCULT BLOOD X 1 CARD TO LAB, STOOL     Status: Abnormal   Collection Time    02/28/13  1:33 PM      Result Value Range   Fecal Occult Bld POSITIVE (*) NEGATIVE  OCCULT BLOOD X 1 CARD TO LAB, STOOL     Status: None   Collection Time    03/01/13  2:12 AM      Result Value Range   Fecal Occult Bld NEGATIVE  NEGATIVE  OCCULT BLOOD X 1 CARD TO LAB, STOOL     Status: None   Collection Time    03/01/13  4:55 AM      Result Value  Range   Fecal Occult Bld NEGATIVE  NEGATIVE     HEENT: normal Cardio: irregular Resp: CTA B/L and unlabored GI: BS positive and NT,ND Extremity:  Pulses positive and No Edema Skin:   Intact Neuro: Alert/Oriented, Cranial Nerve II-XII normal, Abnormal Sensory reduced LT on L side, Abnormal Motor 5/5 on R and 4-/5 LUE, 3-/5 Left HF , KE, ankle DF/PF, no dysmetria in UE or LE , no evidence of nystagmus   Musc/Skel:  Tenderness over distal left clavicle GEN NAD   Assessment/Plan: 1. Functional deficits secondary to Right subcortical infarct with LLE>>LUE weakness which require 3+ hours per day of interdisciplinary therapy in a comprehensive inpatient rehab setting. Physiatrist is providing close team supervision and 24 hour management of active medical problems listed below. Physiatrist and rehab team continue to assess barriers to discharge/monitor patient progress toward functional and medical goals. Team conference today please see physician documentation under team conference tab, met with team face-to-face to discuss  problems,progress, and goals. Formulized individual treatment plan based on medical history, underlying problem and comorbidities. FIM: FIM - Bathing Bathing Steps Patient Completed: Chest;Left Arm;Abdomen;Front perineal area;Buttocks;Right upper leg;Left upper leg;Right lower leg (including foot);Left lower leg (including foot);Right Arm Bathing: 4: Steadying assist  FIM - Upper Body Dressing/Undressing Upper body dressing/undressing steps patient completed: Thread/unthread left sleeve of front closure shirt/dress;Thread/unthread right sleeve of front closure shirt/dress;Button/unbutton shirt Upper body dressing/undressing: 4: Min-Patient completed 75 plus % of tasks FIM - Lower Body Dressing/Undressing Lower body dressing/undressing steps patient completed: Thread/unthread right underwear leg;Thread/unthread left underwear leg;Thread/unthread right pants leg;Thread/unthread left pants leg;Pull pants up/down;Don/Doff right sock;Don/Doff left sock (did not use shoes) Lower body dressing/undressing: 4: Min-Patient completed 75 plus % of tasks  FIM - Toileting Toileting steps completed by patient: Adjust clothing prior to toileting;Performs perineal hygiene Toileting Assistive Devices: Grab bar or rail for support Toileting: 3: Mod-Patient completed 2 of 3 steps  FIM - Diplomatic Services operational officer Devices: Walker;Elevated toilet seat Toilet Transfers: 4-From toilet/BSC: Min A (steadying Pt. > 75%);4-To toilet/BSC: Min A (steadying Pt. > 75%)  FIM - Bed/Chair Transfer Bed/Chair Transfer Assistive Devices: Bed rails Bed/Chair Transfer: 5: Supine > Sit: Supervision (verbal cues/safety issues);5: Sit > Supine: Supervision (verbal cues/safety issues);4: Chair or W/C > Bed: Min A (steadying Pt. > 75%);4: Bed > Chair or W/C: Min A (steadying Pt. > 75%)  FIM - Locomotion: Wheelchair Distance: 40 Locomotion: Wheelchair: 1: Total Assistance/staff pushes wheelchair (Pt<25%) FIM -  Locomotion: Ambulation Locomotion: Ambulation Assistive Devices: Chief Operating Officer Ambulation/Gait Assistance: 4: Min assist Locomotion: Ambulation: 1: Travels less than 50 ft with minimal assistance (Pt.>75%)  Comprehension Comprehension Mode: Auditory Comprehension: 6-Follows complex conversation/direction: With extra time/assistive device  Expression Expression Mode: Verbal Expression: 6-Expresses complex ideas: With extra time/assistive device  Social Interaction Social Interaction Mode: Asleep Social Interaction: 7-Interacts appropriately with others - No medications needed.  Problem Solving Problem Solving: 6-Solves complex problems: With extra time  Memory Memory: 6-More than reasonable amt of time  Medical Problem List and Plan:  1. Suspect small subcortical CVA not identified on MRI. Recent right lenticular nucleus infarct May 2014 with little residual  2. DVT Prophylaxis/Anticoagulation: Xarelto. Monitor for any bleeding episodes  3. Pain Management/left distal clavicular fracture, pain better on regular Tylenol . This may be causing lightheadedness 4. Neuropsych: This patient is capable of making decisions on her own behalf.  5. Hypertension. Norvasc 2.5 mg daily. Monitor with increased mobility  6. Hyperlipidemia. Lipitor  7. Rate Controlled A  Fib 8.  Clavicle fx per ortho, sling and nonweightbearing, followup in the office 3 weeks. I'll also ask her to use her hand as much as possible but not bear weight, maintain elbow ROM 9.  constipation improved with Senokot S. Now complains of loose stools. We'll change to Colace 10.. Lightheadedness orthostatic BP,  BMET and CBC ok,improved off  T#3 LOS (Days) 14 A FACE TO FACE EVALUATION WAS PERFORMED  KIRSTEINS,ANDREW E 03/01/2013, 9:04 AM

## 2013-03-01 NOTE — Progress Notes (Signed)
Social Work Lucy Chris, LCSW Social Worker Signed  Patient Care Conference Service date: 02/28/2013 2:04 PM  Inpatient RehabilitationTeam Conference and Plan of Care Update Date: 02/28/2013   Time: 10;50 Am     Patient Name: [name]       Medical Record Number: [id number]   Date of Birth: 1925/01/16 Sex: Female         Room/Bed: 4M07C/4M07C-01 Payor Info: Payor: MEDICARE / Plan: MEDICARE PART A AND B / Product Type: *No Product type* /   Admitting Diagnosis: R CVA   Admit Date/Time:  02/15/2013  6:11 PM Admission Comments: No comment available   Primary Diagnosis:  Subcortical infarction Principal Problem: Subcortical infarction    Patient Active Problem List     Diagnosis  Date Noted   .  Subcortical infarction  02/16/2013   .  Possible Seizures  02/14/2013   .  TIA (transient ischemic attack)  02/12/2013   .  Stroke-like symptom  02/12/2013   .  Hyperglycemia  02/12/2013   .  High cholesterol     .  CVA (cerebral vascular accident)  02/05/2013   .  Left leg weakness  02/05/2013   .  Atrial fibrillation  02/05/2013   .  Chronic anticoagulation  02/05/2013   .  HTN (hypertension)  11/14/2012   .  right brain stroke too small to be seen on MRI  08/02/2012   .  Syncope  06/01/2012   .  Right ureteral stone  09/15/2010   .  Shoulder pain  08/19/2010   .  Atrial flutter  07/17/2010   .  Orthostatic hypotension  07/17/2010   .  OSTEOPENIA  06/03/2009   .  MIGRAINE HEADACHE  02/05/2008   .  CATARACTS, BILATERAL  02/05/2008   .  DECREASED HEARING  02/05/2008   .  GEN OSTEOARTHROSIS INVOLVING MULTIPLE SITES  02/05/2008   .  COLONIC POLYPS, HX OF  02/05/2008     Expected Discharge Date: Expected Discharge Date: 03/03/13  Team Members Present: Physician leading conference: Dr. Claudette Laws Social Worker Present: Staci Acosta, LCSW;Becky Fuad Forget, LCSW Nurse Present: Gregor Hams, RN PT Present: Wanda Plump, PT OT Present: Scherrie November, Felipa Eth,  OT SLP Present: Maxcine Ham, SLP PPS Coordinator present : Edson Snowball, Chapman Fitch, RN, CRRN        Current Status/Progress  Goal  Weekly Team Focus   Medical     distal clavicular fracture is less painful  re calibrate goals with limited LUE fxn  maintain ROM at elbow and wrist, manage bowels   Bowel/Bladder     cont of bowel and bladder. LBM 11/30 on scheduled bowel meds  remain cont of bowel and bladder  monitor for diarrhea/constipation   Swallow/Nutrition/ Hydration     WFL       ADL's     steady assist with bathing in standing, Min assist to don button up shirt with large buttons, min assist with LB dressing only to pulll briefs over L hip, set up with grooming and to don Ted hose and arm sling  Goals modified 03/27/13 from supervision to min assist due to change in medical status  ADL retraining, pt and family education to prepare for discharge   Mobility     min assist bed and transfers, gait up to 30'    activity tolerance,transfers, gait   Communication     [facility]       Safety/Cognition/ Behavioral Observations    pt  in use of bed alarm when in bed, uses call bell appropriately. ambulates with hemiwalker hand held assist; pt shows no unsafe behaviors. SRx3  no further falls while on rehab unit  contiune bed alarm and reenforce call bell, SRx3   Pain     PRN tylenol 650mg  before therapy; refuses tylenol 3 due to codeine. pain on L shoulder  Pain level 3 or less on a scale of 0-10.  assess pain and pre-medicate before therapy as needed   Skin     CDI; mild bruising on L shoulder  No new skin breakdown/infection.  monitor skin qshift and prn     *See Care Plan and progress notes for long and short-term goals.    Barriers to Discharge:  see above      Possible Resolutions to Barriers:    see above      Discharge Planning/Teaching Needs:    Home with daughter's will make sure someone is with 24 hour, aware of recommendation of staying on first level.      Team  Discussion:    Pian limits pt in therapies, but making progress.  Goals now min assist due to arm in sling for ADL's and for safety when up ambulating.  Activity tolerance poor.  Poor po intake-family and staffing pushing po's.  Need family education form d/c Sat.   Revisions to Treatment Plan:    New d/c date set for 12/6 revised goals since fall to min assist level    Continued Need for Acute Rehabilitation Level of Care: The patient requires daily medical management by a physician with specialized training in physical medicine and rehabilitation for the following conditions: Daily direction of a multidisciplinary physical rehabilitation program to ensure safe treatment while eliciting the highest outcome that is of practical value to the patient.: Yes Daily medical management of patient stability for increased activity during participation in an intensive rehabilitation regime.: Yes Daily analysis of laboratory values and/or radiology reports with any subsequent need for medication adjustment of medical intervention for : Neurological problems;Other  [place] Shannahan, Lemar Livings 03/01/2013, 12:19 PM         Lucy Chris, LCSW Social Worker Signed  Patient Care Conference Service date: 02/21/2013 2:13 PM  Inpatient RehabilitationTeam Conference and Plan of Care Update Date: 02/21/2013   Time: 11:45 Am     Patient Name: [name]       Medical Record Number: [id number]   Date of Birth: 09-17-1924 Sex: Female         Room/Bed: 4M05C/4M05C-01 Payor Info: Payor: MEDICARE / Plan: MEDICARE PART A AND B / Product Type: *No Product type* /   Admitting Diagnosis: R CVA   Admit Date/Time:  02/15/2013  6:11 PM Admission Comments: No comment available   Primary Diagnosis:  Subcortical infarction Principal Problem: Subcortical infarction    Patient Active Problem List     Diagnosis  Date Noted   .  Subcortical infarction  02/16/2013   .  Possible Seizures  02/14/2013   .  TIA (transient ischemic  attack)  02/12/2013   .  Stroke-like symptom  02/12/2013   .  Hyperglycemia  02/12/2013   .  High cholesterol     .  CVA (cerebral vascular accident)  02/05/2013   .  Left leg weakness  02/05/2013   .  Atrial fibrillation  02/05/2013   .  Chronic anticoagulation  02/05/2013   .  HTN (hypertension)  11/14/2012   .  right brain stroke too  small to be seen on MRI  08/02/2012   .  Syncope  06/01/2012   .  Right ureteral stone  09/15/2010   .  Shoulder pain  08/19/2010   .  Atrial flutter  07/17/2010   .  Orthostatic hypotension  07/17/2010   .  OSTEOPENIA  06/03/2009   .  MIGRAINE HEADACHE  02/05/2008   .  CATARACTS, BILATERAL  02/05/2008   .  DECREASED HEARING  02/05/2008   .  GEN OSTEOARTHROSIS INVOLVING MULTIPLE SITES  02/05/2008   .  COLONIC POLYPS, HX OF  02/05/2008     Expected Discharge Date: Expected Discharge Date: 02/27/13  Team Members Present: Physician leading conference: Dr. Claudette Laws Social Worker Present: Staci Acosta, LCSW;Becky Teairra Millar, LCSW Nurse Present: Other (comment) Doree Fudge Rosero-RN) PT Present: Wanda Plump, PT OT Present: Bretta Bang, OT SLP Present: Maxcine Ham, SLP PPS Coordinator present : [name] Duck, RN, CRRN        Current Status/Progress  Goal  Weekly Team Focus   Medical     Patient with fall distal clavicular fracture  Minimize impact of fracture  Pain management, maintain range of motion at the left elbow and hand and wrist   Bowel/Bladder     Continent of bowel and bladder. LBM 11/24. Wears pull-up.  Remain continent of bowel and bladder.  Monitor s/s of diarrhea and constipation.   Swallow/Nutrition/ Hydration     WFL       ADL's     supervision with self care, min assist with toilet and shower transfers, excellent return of LUE movement and strength  supervision overall including ADL transfers  ADL transfer training, pt and family education, LUE strengthening   Mobility     Min A transfers, min A gait with RW 50-100', Min  A stairs x6   Supervision overall, but Min A stairs x15  activity tolerance, strengthening, balance (Berg was 22/56), stairs, and gait with RW - not wanting to buy another walker (has 4WW and hemi-walker)   Communication     [facility]       Safety/Cognition/ Behavioral Observations    No safety concerns       Pain     n/a  Pain level 3 or less on a scale of 0-10.  Monitor any onset of pain q shift.   Skin     Skin intact  No new skin breakdown/infection.  Monitor skin q shift.     *See Care Plan and progress notes for long and short-term goals.    Barriers to Discharge:  See above      Possible Resolutions to Barriers:    Continued therapy, see above      Discharge Planning/Teaching Needs:    HOme with daughter's who plan to provide 24 hr care-here daily      Team Discussion:    Just fell in shower with OT.  X-rays, family to arrange 24 hour care. May not be feasible to do flight of stairs.   Revisions to Treatment Plan:    None    Continued Need for Acute Rehabilitation Level of Care: The patient requires daily medical management by a physician with specialized training in physical medicine and rehabilitation for the following conditions: Daily direction of a multidisciplinary physical rehabilitation program to ensure safe treatment while eliciting the highest outcome that is of practical value to the patient.: Yes Daily medical management of patient stability for increased activity during participation in an intensive rehabilitation regime.: Yes Daily analysis of  laboratory values and/or radiology reports with any subsequent need for medication adjustment of medical intervention for : Other;Neurological problems  Lucy Chris 02/23/2013, 12:59 PM          Patient ID: Noreene Larsson, female   DOB: 1925-03-27, 79 y.o.   MRN: [id number]

## 2013-03-01 NOTE — Progress Notes (Signed)
Physical Therapy Note  Patient Details  Name: [name] MRN: [id number] Date of Birth: 1925/02/12 Today's Date: 03/01/2013  0805-0900 55 min individual therapy  No pain voiced.  Pt using toilet with daughter [name] when therapist entered room.  She was not holding onto pt, attending to hygiene.  Therapist recommended [name] always keep a hand on pt when pt was standing.  Pt exhausted after BM; gait from toilet to sink with min assist, mod assist for sequencing; she had to sit down to wash hands.  Pt with loose stools; she was unhappy and refused stool softener; pt spoke with MD during tx.  Gait in ADL apt on carpet, transferring to bed with min guard assist, and standing at bureau to remove items with superviison.  Pt used bil hands in sitting to fold up 8 pillow cases.  Increased spontaneous use of L hand today; pt happy with this progress.  Gait up /down 3 (5" high) steps with R rail, min guard assist; leading up and down with R foot.  She descended sideways and needed min cues to leave space for L foot on each step.  BP after gait in apt: 101/64, HR 92 before stairs.  Pt c/o dizziness at end of stair mgt; BP 11/71 , HR 97 once seated.  Kinetron in sitting for neuromuscular re-education LLE via forced use, demo; resistance 30 cm/sec, x 20 cycles x 1,x  25 cycles x 2; rated 12 on Borg scale. When questioned, pt stated she was a little bit dizzy after transfer into w/c; min assist.  [name] 03/01/2013, 7:55 AM

## 2013-03-02 ENCOUNTER — Encounter (HOSPITAL_COMMUNITY): Payer: Medicare Other | Admitting: Occupational Therapy

## 2013-03-02 ENCOUNTER — Telehealth: Payer: Self-pay | Admitting: *Deleted

## 2013-03-02 ENCOUNTER — Inpatient Hospital Stay (HOSPITAL_COMMUNITY): Payer: Medicare Other | Admitting: Occupational Therapy

## 2013-03-02 ENCOUNTER — Inpatient Hospital Stay (HOSPITAL_COMMUNITY): Payer: Medicare Other | Admitting: *Deleted

## 2013-03-02 DIAGNOSIS — G8110 Spastic hemiplegia affecting unspecified side: Secondary | ICD-10-CM

## 2013-03-02 DIAGNOSIS — I6330 Cerebral infarction due to thrombosis of unspecified cerebral artery: Secondary | ICD-10-CM

## 2013-03-02 MED ORDER — DIVALPROEX SODIUM ER 250 MG PO TB24: 250.0000 mg | ORAL_TABLET | Freq: Every day | ORAL | Status: DC

## 2013-03-02 MED ORDER — AMLODIPINE BESYLATE 2.5 MG PO TABS: 2.5000 mg | ORAL_TABLET | Freq: Every day | ORAL | Status: DC

## 2013-03-02 MED ORDER — SALINE SPRAY 0.65 % NA SOLN: 1.0000 | NASAL | Status: DC | PRN

## 2013-03-02 MED ORDER — ATORVASTATIN CALCIUM 40 MG PO TABS: 40.0000 mg | ORAL_TABLET | Freq: Every day | ORAL | Status: DC

## 2013-03-02 MED ORDER — RIVAROXABAN 15 MG PO TABS: 15.0000 mg | ORAL_TABLET | Freq: Every day | ORAL | Status: DC

## 2013-03-02 MED ORDER — PANTOPRAZOLE SODIUM 40 MG PO TBEC: 40.0000 mg | DELAYED_RELEASE_TABLET | Freq: Every day | ORAL | Status: DC

## 2013-03-02 NOTE — Progress Notes (Signed)
Social Work Discharge Note Discharge Note  The overall goal for the admission was met for:   Discharge location: Yes-HOME WITH DAUGHTER'S WHO WILL PROVIDE 24 HOUR CARE  Length of Stay: Yes-15 DAYS  Discharge activity level: Yes-SUPERVISION/MIN LEVEL  Home/community participation: Yes  Services provided included: MD, RD, PT, OT, SLP, RN, Pharmacy, Neuropsych and SW  Financial Services: Medicare and Private Insurance: [employer]  Follow-up services arranged: Home Health: [facility]-PT,OT,RN, DME: [facility] and Patient/Family request agency HH: USED BEFORE, DME: NO PREF  Comments (or additional information):FAMILY EDUCATION COMPLETED WITH DAUGHTER-KIT OTHER DAUGHTER KIM IS AN RN.  AWARE OF TEAM'S RECOMMENDATION OF STAYING ON FIRST FLOOR AND 24 HOUR CARE AT DISCHARGE  Patient/Family verbalized understanding of follow-up arrangements: Yes  Individual responsible for coordination of the follow-up plan: KIT-DAUGHTER & KIM-DAUGHTER  Confirmed correct DME delivered: Lucy Chris 03/02/2013    Lucy Chris

## 2013-03-02 NOTE — Discharge Summary (Signed)
Discharge summary job [id number]

## 2013-03-02 NOTE — Progress Notes (Signed)
Occupational Therapy Session Note  Patient Details  Name: [name] MRN: [id number] Date of Birth: [birth year]  Today's Date: 03/02/2013 Time: 0900-1000 Time Calculation (min): 60 min  Short Term Goals: Week 1:  OT Short Term Goal 1 (Week 1): Pt will demonstrate improved standing balance to stand with min assist as she pulls pants up/down hips. OT Short Term Goal 1 - Progress (Week 1): Partly met (as of 02/21/13, pt had met goal. Now fearful after fall.  Continue goal.) OT Short Term Goal 2 (Week 1): Pt will be able to transfer to elevated toilet seat with min assist from w/c. OT Short Term Goal 2 - Progress (Week 1): Partly met (as of 02/21/13, pt met goal. Now fearful after fall, continue to work on goal.) OT Short Term Goal 3 (Week 1): Pt will be able to transfer to shower seat with grab bars with min assist. OT Short Term Goal 3 - Progress (Week 1): Partly met (She had met this goal as of 11/26, but had a fall in shower that day. Need to reassess.) OT Short Term Goal 4 (Week 1): Pt's LUE strength will improve from a 3+/5 to a 4/5 to enable her to use her arm more actively with transfers. OT Short Term Goal 4 - Progress (Week 1): Partly met (She had met this goal as of 11/26.  Now with clavicular fx inhibiting movement. ) Week 2:  OT Short Term Goal 1 (Week 2): Pt will be able to transfer to and from [facility] and or toilet with min assist. OT Short Term Goal 2 (Week 2): Pt will be able to don shirt with min assist to doff over L arm. OT Short Term Goal 3 (Week 2): Pt will be able to don underwear and pants with min assist. OT Short Term Goal 4 (Week 2): Pt will be able to bathe self with min assist.     Skilled Therapeutic Interventions/Progress Updates:      Pt seen for BADL retraining of toileting, bathing, and dressing with a focus on safe functional mobility.  Pt was encouraged to sit for a few seconds before rising and then stand for a few seconds before taking steps. Pt was able to  ambulate to toilet with HW with close supervision to steady assist and then into shower stall.  Pt opted not to try to wash her own feet. She can do this from the w/c level but was nervous in the shower. Recommended that they purchase a long handled sponge.  Pt needed cues 4x to not stand up quickly as she needed to make sure she rinsed the soap off her feet, stood with a wide base of support and concentrated on standing smoothly without being distracted by holding the soap or the hand held shower. Otherwise, pt did complete all transfers with steady assist, bathing and dressing with min assist.  She continues to have difficulty fully pulling pants over left hip.   Pt's activity tolerance and standing tolerance has improved a great deal.   Once self care was complete, pt worked on her gaze stabilization exercises and repeated horizontal and vertical head movement for 1 min each 3x without complaints of dizziness. She used theraputty for L grip strengthening. Pt resting in recliner at end of session with call light in reach.  Therapy Documentation Precautions:  Precautions Precautions: Fall Precaution Comments: NWB of LUE, sling to LUE Required Braces or Orthoses: Sling Restrictions Weight Bearing Restrictions: Yes LUE Weight Bearing: Non weight bearing  Vital Signs: Therapy Vitals Pulse Rate: 99 BP: 119/76 mmHg Patient Position, if appropriate: Sitting Pain: Pain Assessment Pain Assessment: No/denies pain ADL: See FIM for current functional status  Therapy/Group: Individual Therapy  SAGUIER,JULIA 03/02/2013, 10:36 AM

## 2013-03-02 NOTE — Progress Notes (Signed)
Social Work Patient ID: [name], female   DOB: 1925/02/18, 79 y.o.   MRN: [id number] Met with pt and daughter-Kit yesterday to discuss discharge plans.  Agreeable to [facility] report has all other DME.Marland Kitchen  Kit has been learning her care and is doing well. Both feel comfortable with discharge Sat.  Pt is looking forward for this.

## 2013-03-02 NOTE — Progress Notes (Signed)
[name] is a 79 y.o. 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 Admitted 02/12/2013 with new onset of left facial droop as well as left-sided weakness. Recent admit 02/05/2013 for recurrent left-sided weakness with MRI showing expected evolution of right corona radiata lentiform nuclei infarct which did occur in May no new acute abnormalities. Followup MRI 02/12/2013 again showing no evidence of acute infarct. Patient did not receive TPA. EEG negative for seizure. MRI lumbar spine showed no unilateral abnormality to explain left hemiplegia. Neurology followup patient remains on Xarelto for CVA prophylaxis workup indicated small subcortical infarct small vessel involvement not seen on MRI. Noted intermittent bouts of headache placed on low-dose Depakote  Subjective/Complaints:  Ready for D/C tomorrow , shoulder pain improved Pt states she's had hemmorhoids in past   ROS- alternating diarrhea and constipation  Objective: Vital Signs: Blood pressure 148/93, pulse 68, temperature 98.1 F (36.7 C), temperature source Oral, resp. rate 16, height 5\' 7"  (1.702 m), weight 52.345 kg (115 lb 6.4 oz), SpO2 94.00%. No results found. Results for orders placed during the hospital encounter of 02/15/13 (from the past 72 hour(s))  CBC     Status: Abnormal   Collection Time    02/27/13 10:40 AM      Result Value Range   WBC 6.2  4.0 - 10.5 K/uL   RBC 3.87  3.87 - 5.11 MIL/uL   Hemoglobin 11.7 (*) 12.0 - 15.0 g/dL   HCT 16.1 (*) 09.6 - 04.5 %   MCV 90.4  78.0 - 100.0 fL   MCH 30.2  26.0 - 34.0 pg   MCHC 33.4  30.0 - 36.0 g/dL   RDW 40.9  81.1 - 91.4 %   Platelets 173  150 - 400 K/uL  BASIC METABOLIC PANEL     Status: Abnormal   Collection Time    02/27/13 10:40 AM      Result Value Range   Sodium 139  135 - 145 mEq/L   Potassium 4.4  3.5 - 5.1 mEq/L   Chloride 101  96 - 112  mEq/L   CO2 31  19 - 32 mEq/L   Glucose, Bld 117 (*) 70 - 99 mg/dL   BUN 14  6 - 23 mg/dL   Creatinine, Ser 7.82  0.50 - 1.10 mg/dL   Calcium 8.9  8.4 - 95.6 mg/dL   GFR calc non Af Amer 63 (*) >90 mL/min   GFR calc Af Amer 73 (*) >90 mL/min   Comment: (NOTE)     The eGFR has been calculated using the CKD EPI equation.     This calculation has not been validated in all clinical situations.     eGFR's persistently <90 mL/min signify possible Chronic Kidney     Disease.  OCCULT BLOOD X 1 CARD TO LAB, STOOL     Status: Abnormal   Collection Time    02/28/13  1:33 PM      Result Value Range   Fecal Occult Bld POSITIVE (*) NEGATIVE  OCCULT BLOOD X 1 CARD TO LAB, STOOL     Status: None   Collection Time    03/01/13  2:12 AM      Result Value Range   Fecal Occult Bld NEGATIVE  NEGATIVE  OCCULT BLOOD X 1 CARD TO LAB, STOOL     Status: None   Collection Time    03/01/13  4:55 AM  Result Value Range   Fecal Occult Bld NEGATIVE  NEGATIVE     HEENT: normal Cardio: irregular Resp: CTA B/L and unlabored GI: BS positive and NT,ND Extremity:  Pulses positive and No Edema Skin:   Intact Neuro: Alert/Oriented, Cranial Nerve II-XII normal, Abnormal Sensory reduced LT on L side, Abnormal Motor 5/5 on R and 4-/5 LUE, 3-/5 Left HF , KE, ankle DF/PF, no dysmetria in UE or LE , no evidence of nystagmus   Musc/Skel:  Tenderness over distal left clavicle GEN NAD   Assessment/Plan: 1. Functional deficits secondary to Right subcortical infarct with LLE>>LUE weakness which require 3+ hours per day of interdisciplinary therapy in a comprehensive inpatient rehab setting. Physiatrist is providing close team supervision and 24 hour management of active medical problems listed below. Physiatrist and rehab team continue to assess barriers to discharge/monitor patient progress toward functional and medical goals.  FIM: FIM - Bathing Bathing Steps Patient Completed: Chest;Left Arm;Abdomen;Front  perineal area;Buttocks;Right upper leg;Left upper leg;Right lower leg (including foot);Left lower leg (including foot);Right Arm Bathing: 5: Supervision: Safety issues/verbal cues  FIM - Upper Body Dressing/Undressing Upper body dressing/undressing steps patient completed: Thread/unthread left sleeve of front closure shirt/dress;Thread/unthread right sleeve of front closure shirt/dress;Button/unbutton shirt Upper body dressing/undressing: 4: Min-Patient completed 75 plus % of tasks FIM - Lower Body Dressing/Undressing Lower body dressing/undressing steps patient completed: Thread/unthread right underwear leg;Thread/unthread left underwear leg;Thread/unthread right pants leg;Thread/unthread left pants leg;Don/Doff left shoe;Don/Doff right shoe Lower body dressing/undressing: 4: Min-Patient completed 75 plus % of tasks  FIM - Toileting Toileting steps completed by patient: Adjust clothing prior to toileting;Performs perineal hygiene;Adjust clothing after toileting Toileting Assistive Devices: Grab bar or rail for support Toileting: 4: Steadying assist  FIM - Diplomatic Services operational officer Devices: Elevated toilet seat;Grab bars Toilet Transfers: 4-From toilet/BSC: Min A (steadying Pt. > 75%);4-To toilet/BSC: Min A (steadying Pt. > 75%)  FIM - Bed/Chair Transfer Bed/Chair Transfer Assistive Devices: Arm rests Bed/Chair Transfer: 0: Activity did not occur  FIM - Locomotion: Wheelchair Distance: 40 Locomotion: Wheelchair: 0: Activity did not occur FIM - Locomotion: Ambulation Locomotion: Ambulation Assistive Devices: Chief Operating Officer Ambulation/Gait Assistance: 4: Min assist Locomotion: Ambulation: 0: Activity did not occur  Comprehension Comprehension Mode: Auditory Comprehension: 6-Follows complex conversation/direction: With extra time/assistive device  Expression Expression Mode: Verbal Expression: 6-Expresses complex ideas: With extra time/assistive device  Social  Interaction Social Interaction Mode: Asleep Social Interaction: 7-Interacts appropriately with others - No medications needed.  Problem Solving Problem Solving: 6-Solves complex problems: With extra time  Memory Memory: 6-More than reasonable amt of time  Medical Problem List and Plan:  1. Suspect small subcortical CVA not identified on MRI. Recent right lenticular nucleus infarct May 2014 with little residual  2. DVT Prophylaxis/Anticoagulation: Xarelto. Monitor for any bleeding episodes  3. Pain Management/left distal clavicular fracture, pain better on regular Tylenol . This may be causing lightheadedness 4. Neuropsych: This patient is capable of making decisions on her own behalf.  5. Hypertension. Norvasc 2.5 mg daily. Monitor with increased mobility  6. Hyperlipidemia. Lipitor  7. Rate Controlled A Fib 8.  Clavicle fx per ortho, sling and nonweightbearing, followup in the office 3 weeks. I'll also ask her to use her hand as much as possible but not bear weight, maintain elbow ROM 9.  constipation improved with Senokot S. Now complains of loose stools. We'll change to Colace 10.. Stool OB + x 1, PCP f/u no overt blood LOS (Days) 15 A FACE TO FACE EVALUATION WAS  PERFORMED  KIRSTEINS,ANDREW E 03/02/2013, 8:04 AM

## 2013-03-02 NOTE — Telephone Encounter (Signed)
Pt's daughter, Hadley Pen dropped off FMLA paperwork to be completed for her in case she needs to take pt to doctor's appts or fill in if the pt's sitter is unable to care for the pt.  Forms initiated and forwarded to Provider for signature / completion.

## 2013-03-02 NOTE — Progress Notes (Addendum)
Occupational Therapy Session Note  Patient Details  Name: [name] MRN: [id number] Date of Birth: 08-09-1924  Today's Date: 03/02/2013 Time: 7829-5621 Time Calculation (min): 45 min  Short Term Goals: Week 2:  OT Short Term Goal 1 (Week 2): Pt will be able to transfer to and from [facility] and or toilet with min assist. OT Short Term Goal 2 (Week 2): Pt will be able to don shirt with min assist to doff over L arm. OT Short Term Goal 3 (Week 2): Pt will be able to don underwear and pants with min assist. OT Short Term Goal 4 (Week 2): Pt will be able to bathe self with min assist.  Skilled Therapeutic Interventions/Progress Updates:  Patient resting in bed upon arrival with her daughter, Kit, present.  Engaged in bed mobility, activity tolerance, ambulate with hemi walker to recliner, and gaze stabilization vestibular exercises to education patient.  Patient's daughter assisted and provided cues for all functional mobility and verbalized understanding on how to assist her mother with gaze stabilizing exercises.  Reviewed with daughter amount of assist patient currently requires for BADL tasks and daughter reports she has no further questions.    Therapy Documentation Precautions:  Precautions Precautions: Fall Precaution Comments: NWB of LUE, sling to LUE Required Braces or Orthoses: Sling Restrictions Weight Bearing Restrictions: Yes LUE Weight Bearing: Non weight bearing Pain: No c/o pain  Therapy/Group: Individual Therapy  Stefana Lodico 03/02/2013, 3:37 PM

## 2013-03-02 NOTE — Progress Notes (Signed)
Physical Therapy Discharge Summary  Patient Details  Name: [name] MRN: [id number] Date of Birth: June 16, 1924  Today's Date: 03/02/2013 Time: 8:05-8:45 and 14:45-15:21   Patient has met 9 of 9 long term goals due to improved activity tolerance, improved balance, increased strength, functional use of  right lower extremity and improved coordination.  Patient to discharge at an ambulatory level Supervision.   Patient's care partner is independent to provide the necessary physical assistance at discharge.   Recommendation:  Patient will benefit from ongoing skilled PT services in home health setting to continue to advance safe functional mobility, address ongoing impairments in activity tolerance, R LE strength and coordination, and balance, and minimize fall risk.  Equipment: No equipment provided - Pt has hemi-walker at home and was instructed on height adjustment for proper fit  Reasons for discharge: treatment goals met and discharge from hospital  Patient/family agrees with progress made and goals achieved: Yes  1:2 Tx this session included bed mobility, transfer, ambulation and transfers in home and carpeted setting, and WC mobility. See FIM for functional status.  Pt needing cues throughout tx for safety, posture, and efficiency with mobility. Overall, pt needing S for bed mobility with increased time and effort, transfers on furniture, and close S with gait on various surfaces short distances with hemi-walker and sequencing cues. Pt only able to walk ~ 40' this morning due to fatigue. Stairs x5 with R rail and min A with step-to pattern. Pt wanting to get back in bed at end of tx due to fatigue. All needs in reach.   2:2 Tx focused on family training with daughter for gait, transfers, and home entry. Educated pt/family on stroke risk reduction, increasing activity level safely at home, hemi-walker height, and any remaining concerns. Pt very fatigued.  Supine>sit via rolling, but pt  c/o increased L clavicle pain with rolling, so modifies.  Transfers with S and safe hand placement, but needs cues for posture.  Gait 1x100' in controlled setting and 1x50' in home setting with close S and cues for posture. Pt able to walk much longer distances this afternoon with cues for breathing. Daughter able to identify proper cues for pt safety. Pt able to verbalize safe transfer techniques during transitions.  Demonstrated curb step with hemi-walker for home entry. Daughter able to return-demo with min A.  Dynamic standing balance task decorating tree with S, no UE support, and no LOB.  Unable to repeat Berg due to pt's decreased activity tolerance.  Pt left in bed with all needs in reach.   PT Discharge Precautions/Restrictions Precautions Precautions: Fall Precaution Comments: NWB of LUE, sling to LUE Required Braces or Orthoses: Sling Restrictions Weight Bearing Restrictions: Yes LUE Weight Bearing: Non weight bearing Vital Signs Therapy Vitals Temp: 98.1 F (36.7 C) Temp src: Oral Pulse Rate: 99 Resp: 16 BP: 126/80 mmHg Patient Position, if appropriate: Lying Oxygen Therapy SpO2: 94 % O2 Device: None (Room air) Pain Pain Assessment Pain Assessment: No/denies pain Vision/Perception  Vision - History Baseline Vision: Wears glasses all the time Visual History: Other (comment) (Double vision resolved) Patient Visual Report: No change from baseline Vision - Assessment Eye Alignment: Within Functional Limits Perception Perception: Within Functional Limits Praxis Praxis: Intact  Cognition Overall Cognitive Status: Within Functional Limits for tasks assessed Arousal/Alertness: Awake/alert Orientation Level: Oriented X4 Sensation Sensation Light Touch: Appears Intact Proprioception: Appears Intact Coordination Gross Motor Movements are Fluid and Coordinated: Yes Fine Motor Movements are Fluid and Coordinated: No Coordination and Movement Description:  FMC and  GMC much improved since eval, still somewhat impaired timing Heel Shin Test: Improved 75% - only timing still decreased Motor  Motor Motor: Hemiplegia Motor - Skilled Clinical Observations: Weakers still on L, but improved functional and MMT since eval  Mobility Bed Mobility Supine to Sit: 5: Supervision Supine to Sit Details (indicate cue type and reason): Increased time and effort required, cues for efficiency and technique logrolling to R Sit to Supine: 5: Supervision Transfers Transfers: Yes Stand Pivot Transfers: 5: Supervision Stand Pivot Transfer Details (indicate cue type and reason): Safe and efficient technique noted Locomotion  Ambulation Ambulation: Yes Ambulation/Gait Assistance: 5: Supervision Ambulation Distance (Feet): 40 Feet Assistive device: Hemi-walker Ambulation/Gait Assistance Details: Cues for hemi-walker managment - to pick it up and sequence Stairs / Additional Locomotion Stairs: Yes Stairs Assistance: 4: Min assist Stairs Assistance Details (indicate cue type and reason): Sequence cues  Stair Management Technique: One rail Right;Step to pattern Number of Stairs: 5 Height of Stairs: 6 Wheelchair Mobility Wheelchair Mobility: Yes Wheelchair Assistance: 4: Min Education officer, museum: Right upper extremity;Both lower extermities Wheelchair Parts Management: Needs assistance Distance: 50  Trunk/Postural Assessment  Cervical Assessment Cervical Assessment: Within Functional Limits Thoracic Assessment Thoracic Assessment: Within Functional Limits Lumbar Assessment Lumbar Assessment: Within Functional Limits Postural Control Postural Control: Within Functional Limits  Balance Balance Balance Assessed: Yes Static Sitting Balance Static Sitting - Balance Support: Feet supported Static Sitting - Level of Assistance: 6: Modified independent (Device/Increase time) Dynamic Sitting Balance Dynamic Sitting - Balance Support: Feet supported;During  functional activity Dynamic Sitting - Level of Assistance: 6: Modified independent (Device/Increase time) Static Standing Balance Static Standing - Balance Support: Right upper extremity supported Static Standing - Level of Assistance: 5: Stand by assistance Extremity Assessment      RLE Assessment RLE Assessment: Within Functional Limits RLE Strength RLE Overall Strength Comments: Grossly 4/5 throughout LLE Assessment LLE Assessment: Exceptions to [facility] LLE Strength LLE Overall Strength Comments: 3/5 throughout except quad 3-/5  See FIM for current functional status  Clydene Laming, PT, DPT  03/02/2013, 9:02 AM

## 2013-03-02 NOTE — Discharge Summary (Signed)
[name], [name]                 ACCOUNT NO.:  [id number]  MEDICAL RECORD NO.:  [id number]  LOCATION:  4M07C                        FACILITY:  [facility]  PHYSICIAN:  Erick Colace, M.D.DATE OF BIRTH:  [birth year]  DATE OF ADMISSION:  02/15/2013 DATE OF DISCHARGE:                              DISCHARGE SUMMARY   DISCHARGE DIAGNOSES: 1. Subcortical cerebrovascular accident. 2. Xarelto for deep vein thrombosis prophylaxis. 3. Hypertension. 4. Hyperlipidemia. 5. Rate controlled atrial fibrillation. 6. Left distal clavicle fracture. 7. Constipation.  HISTORY OF PRESENT ILLNESS:  This is an 79 year old right-handed female with history of atrial fibrillation on Xarelto as well as hypertension and CVA in May 2014, with little residual.  Admitted February 12, 2013, with new onset of left facial droop as well as left-sided weakness. Recent admission February 05, 2013, for recurrent left-sided weakness with MRI showing expected evolution of right corona radiata infarct which did occur in May with no new abnormalities.  Followup MRI February 12, 2013, again showing no evidence of acute infarct.  Suspect subcortical CVA not seen on MRI.  The patient did not receive t-PA.  EEG negative for seizure.  MRI of lumbar spine unremarkable.  Neurology follow up remained on Xarelto.  Low-dose Depakote was added for bouts of headache.  Physical and occupational therapy ongoing.  The patient was admitted for comprehensive rehab program.  PAST MEDICAL HISTORY:  See discharge diagnoses.  SOCIAL HISTORY:  Lives with family.  FUNCTIONAL HISTORY PRIOR TO ADMISSION:  Independent with assistance by her daughter's.  FUNCTIONAL STATUS UPON ADMISSION:  To rehab services +1 total assist for stand pivot transfers.  PHYSICAL EXAMINATION:  VITAL SIGNS:  Blood pressure 128/73, pulse 79, temperature 98.2, respirations 18. GENERAL:  This was an alert female, oriented x3.  She was a bit hard of hearing.  She  followed simple commands. LUNGS:  Clear to auscultation. CARDIAC:  Regular rate and rhythm. ABDOMEN:  Soft, nontender.  Good bowel sounds.  REHABILITATION HOSPITAL COURSE:  The patient was admitted to Inpatient Rehab Services with therapies initiated on a 3-hour daily basis consisting of physical therapy, occupational therapy, and rehabilitation nursing.  The following issues were addressed during the patient's rehabilitation stay.  Pertaining to [name] subcortical CVA not identified on MRI remained stable as well as recent CVA in May 2014, with little residual.  She remained on Xarelto as advised.  Blood pressure is well controlled on Norvasc.  Low-dose Depakote for bouts of headache with good results.  She did have a history of atrial fibrillation with cardiac rate controlled.  No chest pain or shortness of breath.  Bouts of constipation resolved with laxative assistance. The patient continued to participate with therapies noted on February 21, 2013.  The patient with fall while being attended by therapy team while in the bathroom when her knee gave out, landing on her left shoulder.  She did strike the side of her head.  Cranial CT scan November 26, unremarkable for acute change.  X-rays of left clavicle showed distal clavicle fracture.  Orthopedic Services, Dr. Roda Shutters was consulted, advised nonweightbearing with shoulder sling in place, conservative care, and would follow up in the outpatient office.  In  light of this patient continued to participate with her therapies needing some encouragement at times.  Most sessions focused on functional transfers including simulated car transfers using her hemi walker.  The patient was able to recall correct sequencing and technique independently.  Toilet transfers, chair to bed transfers, as well as short distance ambulation with a hemi walker and steady assistance.  The patient overall close supervision to steady assist with mobility  and able to correctly verbalize set up in technique for all transfers.  She was still needing some assistance for activities of daily living due to clavicle fracture.  During her rehab course, there was some decreased nutritional storage, encouragement of appetite, she was using nutritional supplements.  Full family teaching was completed and plan was to be discharged to home.  DISCHARGE MEDICATIONS: 1. Tylenol as needed. 2. Norvasc 2.5 mg p.o. daily. 3. Excedrin migraine 2 tablets as needed for headache daily. 4. Lipitor 40 mg p.o. daily. 5. Depakote ER 250 mg p.o. daily for headaches. 6. Colace 100 mg daily. 7. Protonix 40 mg p.o. daily. 8. Xarelto 15 mg p.o. daily. 9. Ocean nasal spray as needed.  DIET:  Regular.  SPECIAL INSTRUCTIONS:  The patient would follow up Dr. Claudette Laws at the [facility] April 05, 2013, Dr. Roda Shutters of Orthopedic Services 3 weeks, Dr. Delia Heady, Neurology Services 1 month call for appointment, Dr. Sandford Craze, medical management appointment to be made.  Special instructions nonweightbearing left upper extremity with shoulder sling.  Ongoing therapies were arranged as per rehab services.     Mariam Dollar, P.A.   ______________________________ Erick Colace, M.D.    DA/MEDQ  D:  03/02/2013  T:  03/02/2013  Job:  [id number]  cc:   Erick Colace, M.D. Pramod P. Pearlean Brownie, MD Sandford Craze, NP

## 2013-03-02 NOTE — Progress Notes (Signed)
Occupational Therapy Discharge Summary  Patient Details  Name: [name] MRN: [id number] Date of Birth: Sep 26, 1924  Today's Date: 03/02/2013  Patient has met 8 of 8 long term goals due to improved activity tolerance, improved balance and ability to compensate for deficits.  Patient to discharge at [facility] Assist level.  Patient's care partner is independent to provide the necessary physical and cognitive assistance at discharge.  (Pt often needs min assist to fully pull briefs over left hip. She can get them up partially and then she can pull her pants over her L hip 50% of the way.  She is min with LB dressing, but the FIM score is reflected as a mod A due to the tight briefs.) Pt's daughter, [name], received family education on 03/01/13 for safe toilet and shower stall transfers.  Reasons goals not met: N/A  Recommendation:  Patient will benefit from ongoing skilled OT services in home health setting to continue to advance functional skills in the area of BADL.  Equipment: BSC  Reasons for discharge: treatment goals met  Patient/family agrees with progress made and goals achieved: Yes  OT Discharge Precautions/Restrictions  Precautions Precautions: Fall Precaution Comments: NWB of LUE, sling to LUE Required Braces or Orthoses: Sling Restrictions Weight Bearing Restrictions: Yes LUE Weight Bearing: Non weight bearing   Vital Signs Therapy Vitals Temp: 98.1 F (36.7 C) Temp src: Oral Pulse Rate: 99 Resp: 16 BP: 119/76 mmHg Patient Position, if appropriate: Sitting Oxygen Therapy SpO2: 94 % O2 Device: None (Room air) Pain Pain Assessment Pain Assessment: No/denies pain ADL  overall min assist - refer to FIM Vision/Perception  Vision - History Baseline Vision: Wears glasses all the time Visual History: Other (comment) (Double vision resolved) Patient Visual Report: No change from baseline Vision - Assessment Eye Alignment: Within Functional  Limits Perception Perception: Within Functional Limits Praxis Praxis: Intact  Cognition Overall Cognitive Status: Within Functional Limits for tasks assessed Arousal/Alertness: Awake/alert Orientation Level: Oriented X4 Sensation Sensation Light Touch: Appears Intact Proprioception: Appears Intact Coordination Gross Motor Movements are Fluid and Coordinated: Yes Fine Motor Movements are Fluid and Coordinated: No Coordination and Movement Description: FMC and GMC much improved since eval, still somewhat impaired timing.  Pt is able to fasten her own buttons and open containers. Heel Shin Test: Improved 75% - only timing still decreased Motor  Motor Motor: Hemiplegia Motor - Skilled Clinical Observations: Weakers still on L, but improved functional and MMT since eval Mobility  Bed Mobility Supine to Sit: 5: Supervision Supine to Sit Details (indicate cue type and reason): Increased time and effort required, cues for efficiency and technique logrolling to R Sit to Supine: 5: Supervision  Trunk/Postural Assessment  Cervical Assessment Cervical Assessment: Within Functional Limits Thoracic Assessment Thoracic Assessment: Within Functional Limits Lumbar Assessment Lumbar Assessment: Within Functional Limits Postural Control Postural Control: Within Functional Limits  Balance Balance Balance Assessed: Yes Static Sitting Balance Static Sitting - Balance Support: Feet supported Static Sitting - Level of Assistance: 6: Modified independent (Device/Increase time) Dynamic Sitting Balance Dynamic Sitting - Balance Support: Feet supported;During functional activity Dynamic Sitting - Level of Assistance: 6: Modified independent (Device/Increase time) Static Standing Balance Static Standing - Balance Support: Right upper extremity supported Static Standing - Level of Assistance: 5: Stand by assistance Extremity/Trunk Assessment RUE Assessment RUE Assessment: Within Functional  Limits LUE Assessment LUE Assessment: Exceptions to [facility] LUE Strength LUE Overall Strength: Due to precautions (grip WFL. elbow WFL. pt uses arm sling for clavicle fracture)  See  FIM for current functional status  SAGUIER,JULIA 03/02/2013, 9:53 AM

## 2013-03-03 DIAGNOSIS — G8110 Spastic hemiplegia affecting unspecified side: Secondary | ICD-10-CM

## 2013-03-03 DIAGNOSIS — I6330 Cerebral infarction due to thrombosis of unspecified cerebral artery: Secondary | ICD-10-CM

## 2013-03-03 NOTE — Progress Notes (Signed)
[name] is a 79 y.o. 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 Admitted 02/12/2013 with new onset of left facial droop as well as left-sided weakness. Recent admit 02/05/2013 for recurrent left-sided weakness with MRI showing expected evolution of right corona radiata lentiform nuclei infarct which did occur in May no new acute abnormalities. Followup MRI 02/12/2013 again showing no evidence of acute infarct. Patient did not receive TPA. EEG negative for seizure. MRI lumbar spine showed no unilateral abnormality to explain left hemiplegia. Neurology followup patient remains on Xarelto for CVA prophylaxis workup indicated small subcortical infarct small vessel involvement not seen on MRI. Noted intermittent bouts of headache placed on low-dose Depakote  Subjective/Complaints:  Excited about going home today!!  ROS- no new issues since yesterday Objective: Vital Signs: Blood pressure 137/84, pulse 67, temperature 97.8 F (36.6 C), temperature source Oral, resp. rate 18, height 5\' 7"  (1.702 m), weight 52.345 kg (115 lb 6.4 oz), SpO2 96.00%. No results found. Results for orders placed during the hospital encounter of 02/15/13 (from the past 72 hour(s))  OCCULT BLOOD X 1 CARD TO LAB, STOOL     Status: Abnormal   Collection Time    02/28/13  1:33 PM      Result Value Range   Fecal Occult Bld POSITIVE (*) NEGATIVE  OCCULT BLOOD X 1 CARD TO LAB, STOOL     Status: None   Collection Time    03/01/13  2:12 AM      Result Value Range   Fecal Occult Bld NEGATIVE  NEGATIVE  OCCULT BLOOD X 1 CARD TO LAB, STOOL     Status: None   Collection Time    03/01/13  4:55 AM      Result Value Range   Fecal Occult Bld NEGATIVE  NEGATIVE     HEENT: normal Cardio: irregular Resp: CTA B/L and unlabored GI: BS positive and NT,ND Extremity:  Pulses positive and No Edema Skin:    Intact Neuro: Alert/Oriented, Cranial Nerve II-XII normal, Abnormal Sensory reduced LT on L side, Abnormal Motor 5/5 on R and 4-/5 LUE, 3-/5 Left HF , KE, ankle DF/PF, no dysmetria in UE or LE , no evidence of nystagmus   Musc/Skel:  Tenderness over distal left clavicle GEN NAD   Assessment/Plan: 1. Functional deficits secondary to Right subcortical infarct with LLE>>LUE weakness which require 3+ hours per day of interdisciplinary therapy in a comprehensive inpatient rehab setting. Physiatrist is providing close team supervision and 24 hour management of active medical problems listed below. Physiatrist and rehab team continue to assess barriers to discharge/monitor patient progress toward functional and medical goals.  Dc orders reviewed yesterday with pt and family. Pt seems to have fairly good awareness. followup scheduled  FIM: FIM - Bathing Bathing Steps Patient Completed: Chest;Left Arm;Abdomen;Front perineal area;Buttocks;Right Arm;Right upper leg;Left upper leg Bathing: 4: Min-Patient completes 8-9 74f 10 parts or 75+ percent  FIM - Upper Body Dressing/Undressing Upper body dressing/undressing steps patient completed: Thread/unthread left sleeve of front closure shirt/dress;Thread/unthread right sleeve of front closure shirt/dress;Button/unbutton shirt Upper body dressing/undressing: 4: Min-Patient completed 75 plus % of tasks FIM - Lower Body Dressing/Undressing Lower body dressing/undressing steps patient completed: Thread/unthread right underwear leg;Thread/unthread left underwear leg;Thread/unthread right pants leg;Thread/unthread left pants leg;Don/Doff left shoe;Don/Doff right shoe Lower body dressing/undressing: 4: Min-Patient completed 75 plus % of tasks  FIM - Toileting Toileting steps completed by patient: Adjust clothing  prior to toileting;Performs perineal hygiene;Adjust clothing after toileting Toileting Assistive Devices: Grab bar or rail for support Toileting: 5:  Supervision: Safety issues/verbal cues  FIM - Diplomatic Services operational officer Devices: Elevated toilet seat;Grab bars;Walker Toilet Transfers: 4-From toilet/BSC: Min A (steadying Pt. > 75%);4-To toilet/BSC: Min A (steadying Pt. > 75%)  FIM - Bed/Chair Transfer Bed/Chair Transfer Assistive Devices: Arm rests Bed/Chair Transfer: 5: Supine > Sit: Supervision (verbal cues/safety issues);5: Bed > Chair or W/C: Supervision (verbal cues/safety issues);5: Chair or W/C > Bed: Supervision (verbal cues/safety issues)  FIM - Locomotion: Wheelchair Distance: 50 Locomotion: Wheelchair: 0: Activity did not occur FIM - Locomotion: Ambulation Locomotion: Ambulation Assistive Devices: Chief Operating Officer Ambulation/Gait Assistance: 5: Supervision Locomotion: Ambulation: 2: Travels 50 - 149 ft with supervision/safety issues  Comprehension Comprehension Mode: Auditory Comprehension: 6-Follows complex conversation/direction: With extra time/assistive device  Expression Expression Mode: Verbal Expression: 6-Expresses complex ideas: With extra time/assistive device  Social Interaction Social Interaction Mode: Asleep Social Interaction: 7-Interacts appropriately with others - No medications needed.  Problem Solving Problem Solving: 6-Solves complex problems: With extra time  Memory Memory: 6-More than reasonable amt of time  Medical Problem List and Plan:  1. Suspect small subcortical CVA not identified on MRI. Recent right lenticular nucleus infarct May 2014 with little residual  2. DVT Prophylaxis/Anticoagulation: Xarelto. Monitor for any bleeding episodes  3. Pain Management/left distal clavicular fracture, pain better on regular Tylenol . This may be causing lightheadedness 4. Neuropsych: This patient is capable of making decisions on her own behalf.  5. Hypertension. Norvasc 2.5 mg daily. Monitor with increased mobility  6. Hyperlipidemia. Lipitor  7. Rate Controlled A Fib 8.  Clavicle  fx per ortho, sling and nonweightbearing, followup in the office 3 weeks. I'll also ask her to use her hand as much as possible but not bear weight, maintain elbow ROM 9.  constipation improved with Senokot S. Now complains of loose stools. We'll change to Colace 10.. Stool OB + x 1, PCP f/u no overt blood LOS (Days) 16 A FACE TO FACE EVALUATION WAS PERFORMED  SWARTZ,ZACHARY T 03/03/2013, 8:08 AM

## 2013-03-03 NOTE — Progress Notes (Signed)
Pt discharged at 0933 accompanied by 3 daughters. Questions answered regarding meds and BP this AM. VSS, no complaints of pain. Mick Sell RN

## 2013-03-04 DIAGNOSIS — I4891 Unspecified atrial fibrillation: Secondary | ICD-10-CM | POA: Diagnosis not present

## 2013-03-04 DIAGNOSIS — IMO0001 Reserved for inherently not codable concepts without codable children: Secondary | ICD-10-CM | POA: Diagnosis not present

## 2013-03-04 DIAGNOSIS — Z9181 History of falling: Secondary | ICD-10-CM | POA: Diagnosis not present

## 2013-03-04 DIAGNOSIS — I10 Essential (primary) hypertension: Secondary | ICD-10-CM | POA: Diagnosis not present

## 2013-03-04 DIAGNOSIS — M6281 Muscle weakness (generalized): Secondary | ICD-10-CM | POA: Diagnosis not present

## 2013-03-04 DIAGNOSIS — K219 Gastro-esophageal reflux disease without esophagitis: Secondary | ICD-10-CM | POA: Diagnosis not present

## 2013-03-04 DIAGNOSIS — I69959 Hemiplegia and hemiparesis following unspecified cerebrovascular disease affecting unspecified side: Secondary | ICD-10-CM | POA: Diagnosis not present

## 2013-03-05 ENCOUNTER — Telehealth: Payer: Self-pay

## 2013-03-05 ENCOUNTER — Telehealth: Payer: Self-pay | Admitting: Family

## 2013-03-05 DIAGNOSIS — I4891 Unspecified atrial fibrillation: Secondary | ICD-10-CM | POA: Diagnosis not present

## 2013-03-05 DIAGNOSIS — K219 Gastro-esophageal reflux disease without esophagitis: Secondary | ICD-10-CM | POA: Diagnosis not present

## 2013-03-05 DIAGNOSIS — M6281 Muscle weakness (generalized): Secondary | ICD-10-CM | POA: Diagnosis not present

## 2013-03-05 DIAGNOSIS — I10 Essential (primary) hypertension: Secondary | ICD-10-CM | POA: Diagnosis not present

## 2013-03-05 DIAGNOSIS — IMO0001 Reserved for inherently not codable concepts without codable children: Secondary | ICD-10-CM | POA: Diagnosis not present

## 2013-03-05 DIAGNOSIS — I69959 Hemiplegia and hemiparesis following unspecified cerebrovascular disease affecting unspecified side: Secondary | ICD-10-CM | POA: Diagnosis not present

## 2013-03-05 NOTE — Telephone Encounter (Signed)
Please call pt and arrange a hospital follow up visit in 1 week.  

## 2013-03-05 NOTE — Telephone Encounter (Signed)
Patients daughter left a message stating that they are getting a stair lift for pt and they would like an RX for this by Thursday, but no later than Friday?  Pts daughter stated that this would help them not have to pay the tax for the stair lift?  Please advise?

## 2013-03-05 NOTE — Consult Note (Signed)
  NEUROCOGNITIVE STATUS EXAMINATION - [facility]   [name] is an 79 year old, right-handed woman, who was seen for a brief neurocognitive status examination to evaluate her mood and cognitive functioning post-stroke.  According to her medical record, she was admitted on 02/12/13 with new onset of left facial droop and left-sided weakness.  She was previously admitted on 02/05/13 for recurrent left-sided weakness with MRI showing expected evolution of right corona radiate lentiform nuclei infarct, which occurred in May with no acute abnormalities.  Follow-up MRI performed on 02/12/13 did not demonstrate an acute infarct.  She did not receive TPA.  However, CVA prophylaxis workup indicated a small subcortical infarct with small vessel involvement not seen on MRI.       Emotional Functioning:  During the clinical interview, [name] acknowledged a desire to be home and expressed concern that her recovery has been slower than it was following her initial stroke.  This has caused her some frustration.  She also stated that she fears that she is "putting her daughters out," given that they will have to assist her when she returns home.  She commented that she is used to being independent and having to rely on others is unfamiliar and unsettling for her.  However, overall, she stated that she is "putting one foot in front of the other" and seemed prepared for discharge.    [name]' responses to self-report measures of mood symptoms were not suggestive of the presence of clinically significant depression or anxiety at this time.  Of note, [name]' daughter, who was present during this visit, mentioned that her mother seemed to be depressed after falling while on the unit, but said that her mood has improved since she was given a date for discharge.    Mental Status:  [name]' total score on an overall measure of mental status was suggestive of the presence  of significant cognitive disruption, but it is notable that her score seemed to be adversely affected by hearing difficulties (MMSE-2 brief = 10/16).  Subjectively, [name] noted that her memory seems worse than it was before, particularly with regard to recalling familiar names.  She said that these cognitive changes sometimes lead to increased frustration when she is trying to complete activities that she feels are simple or recall information that she has known for a long time.    Impressions and Recommendations:  [name]' overall neurocognitive profile was suggestive of cognitive disruption, but given that hearing problems adversely impacted her score, her cognitive deficits are not likely at the level of dementia.  She is perhaps experiencing Mild Cognitive Impairment (MCI), but more thorough testing would be needed to confirm that diagnosis.  From an emotional standpoint, [name] disclosed experiencing frustration at times, but it seems to be an appropriate adjustment reaction and she appears to be coping well.  I educated her and her daughter on the risks for worsening depression in the setting of stroke and we also discussed the expected timeline for cognitive recovery.  [name] was interested in pursuing more comprehensive neuropsychological evaluation to more clearly define her cognitive weaknesses and to assist with differential diagnosis.  Per her request, Dr. Georjean Mode contact information should be provided for that purpose.    DIAGNOSIS: CVA  [name], Psy.D.  Clinical Neuropsychologist

## 2013-03-05 NOTE — Telephone Encounter (Signed)
Can you call this pt please and schedule appt 

## 2013-03-05 NOTE — Telephone Encounter (Signed)
Opened in error

## 2013-03-07 ENCOUNTER — Telehealth: Payer: Self-pay | Admitting: Neurology

## 2013-03-07 DIAGNOSIS — M6281 Muscle weakness (generalized): Secondary | ICD-10-CM | POA: Diagnosis not present

## 2013-03-07 DIAGNOSIS — I10 Essential (primary) hypertension: Secondary | ICD-10-CM | POA: Diagnosis not present

## 2013-03-07 DIAGNOSIS — I69959 Hemiplegia and hemiparesis following unspecified cerebrovascular disease affecting unspecified side: Secondary | ICD-10-CM | POA: Diagnosis not present

## 2013-03-07 DIAGNOSIS — IMO0001 Reserved for inherently not codable concepts without codable children: Secondary | ICD-10-CM | POA: Diagnosis not present

## 2013-03-07 DIAGNOSIS — K219 Gastro-esophageal reflux disease without esophagitis: Secondary | ICD-10-CM | POA: Diagnosis not present

## 2013-03-07 DIAGNOSIS — I4891 Unspecified atrial fibrillation: Secondary | ICD-10-CM | POA: Diagnosis not present

## 2013-03-07 NOTE — Telephone Encounter (Signed)
FMLA as well as rx for stair lift is ready for pick up.  Please contact pt's daughter [name].

## 2013-03-07 NOTE — Telephone Encounter (Signed)
Needs order by Thursday, when can see pick up

## 2013-03-07 NOTE — Telephone Encounter (Signed)
F/U FMLA PAPERWORK--DUE TO EMPLOYER TODAY--DOES SHE NEED TO GET EXTENSION

## 2013-03-07 NOTE — Telephone Encounter (Signed)
I called and left aptient's daughter a VM that I only received FMLA forms on Feb 27 2013. It takes about two weeks to get them completed. I expect to get them completed and out for signature by Friday. I anticipate they will be back to me by Tuesday. I will cal when completed.

## 2013-03-07 NOTE — Telephone Encounter (Signed)
Please advise 

## 2013-03-07 NOTE — Telephone Encounter (Signed)
FMLA forms copied and sent for scanning. Forms and rx placed at front desk for pick up, notified pt.

## 2013-03-07 NOTE — Telephone Encounter (Signed)
See 03/02/13 phone note.

## 2013-03-09 ENCOUNTER — Inpatient Hospital Stay (HOSPITAL_COMMUNITY): Payer: Medicare Other

## 2013-03-09 ENCOUNTER — Encounter (HOSPITAL_COMMUNITY): Payer: Self-pay | Admitting: General Practice

## 2013-03-09 ENCOUNTER — Inpatient Hospital Stay (HOSPITAL_COMMUNITY)
Admission: AD | Admit: 2013-03-09 | Discharge: 2013-03-12 | DRG: 064 | Disposition: A | Discharge status: Home Health | Payer: Medicare Other | Source: Other Acute Inpatient Hospital | Attending: Internal Medicine | Admitting: Internal Medicine

## 2013-03-09 DIAGNOSIS — Z87891 Personal history of nicotine dependence: Secondary | ICD-10-CM | POA: Diagnosis not present

## 2013-03-09 DIAGNOSIS — R29898 Other symptoms and signs involving the musculoskeletal system: Secondary | ICD-10-CM | POA: Diagnosis present

## 2013-03-09 DIAGNOSIS — Z803 Family history of malignant neoplasm of breast: Secondary | ICD-10-CM

## 2013-03-09 DIAGNOSIS — I4891 Unspecified atrial fibrillation: Secondary | ICD-10-CM | POA: Diagnosis present

## 2013-03-09 DIAGNOSIS — G8190 Hemiplegia, unspecified affecting unspecified side: Secondary | ICD-10-CM | POA: Diagnosis not present

## 2013-03-09 DIAGNOSIS — W19XXXA Unspecified fall, initial encounter: Secondary | ICD-10-CM | POA: Diagnosis present

## 2013-03-09 DIAGNOSIS — G459 Transient cerebral ischemic attack, unspecified: Secondary | ICD-10-CM

## 2013-03-09 DIAGNOSIS — Z800 Family history of malignant neoplasm of digestive organs: Secondary | ICD-10-CM

## 2013-03-09 DIAGNOSIS — S0990XA Unspecified injury of head, initial encounter: Secondary | ICD-10-CM | POA: Diagnosis not present

## 2013-03-09 DIAGNOSIS — M899 Disorder of bone, unspecified: Secondary | ICD-10-CM

## 2013-03-09 DIAGNOSIS — Z79899 Other long term (current) drug therapy: Secondary | ICD-10-CM

## 2013-03-09 DIAGNOSIS — I69998 Other sequelae following unspecified cerebrovascular disease: Secondary | ICD-10-CM

## 2013-03-09 DIAGNOSIS — M159 Polyosteoarthritis, unspecified: Secondary | ICD-10-CM

## 2013-03-09 DIAGNOSIS — G43909 Migraine, unspecified, not intractable, without status migrainosus: Secondary | ICD-10-CM | POA: Diagnosis present

## 2013-03-09 DIAGNOSIS — E7800 Pure hypercholesterolemia, unspecified: Secondary | ICD-10-CM | POA: Diagnosis present

## 2013-03-09 DIAGNOSIS — N201 Calculus of ureter: Secondary | ICD-10-CM

## 2013-03-09 DIAGNOSIS — R55 Syncope and collapse: Secondary | ICD-10-CM | POA: Diagnosis not present

## 2013-03-09 DIAGNOSIS — R29810 Facial weakness: Secondary | ICD-10-CM | POA: Diagnosis present

## 2013-03-09 DIAGNOSIS — Z808 Family history of malignant neoplasm of other organs or systems: Secondary | ICD-10-CM

## 2013-03-09 DIAGNOSIS — E43 Unspecified severe protein-calorie malnutrition: Secondary | ICD-10-CM | POA: Diagnosis present

## 2013-03-09 DIAGNOSIS — R569 Unspecified convulsions: Secondary | ICD-10-CM | POA: Diagnosis present

## 2013-03-09 DIAGNOSIS — G319 Degenerative disease of nervous system, unspecified: Secondary | ICD-10-CM | POA: Diagnosis not present

## 2013-03-09 DIAGNOSIS — G839 Paralytic syndrome, unspecified: Secondary | ICD-10-CM | POA: Diagnosis not present

## 2013-03-09 DIAGNOSIS — M6281 Muscle weakness (generalized): Secondary | ICD-10-CM

## 2013-03-09 DIAGNOSIS — Z681 Body mass index (BMI) 19 or less, adult: Secondary | ICD-10-CM | POA: Diagnosis not present

## 2013-03-09 DIAGNOSIS — R739 Hyperglycemia, unspecified: Secondary | ICD-10-CM

## 2013-03-09 DIAGNOSIS — Z86010 Personal history of colonic polyps: Secondary | ICD-10-CM

## 2013-03-09 DIAGNOSIS — I4892 Unspecified atrial flutter: Secondary | ICD-10-CM | POA: Diagnosis present

## 2013-03-09 DIAGNOSIS — Z7901 Long term (current) use of anticoagulants: Secondary | ICD-10-CM

## 2013-03-09 DIAGNOSIS — Z7982 Long term (current) use of aspirin: Secondary | ICD-10-CM

## 2013-03-09 DIAGNOSIS — R9431 Abnormal electrocardiogram [ECG] [EKG]: Secondary | ICD-10-CM | POA: Diagnosis not present

## 2013-03-09 DIAGNOSIS — I951 Orthostatic hypotension: Secondary | ICD-10-CM | POA: Diagnosis present

## 2013-03-09 DIAGNOSIS — H9190 Unspecified hearing loss, unspecified ear: Secondary | ICD-10-CM

## 2013-03-09 DIAGNOSIS — I6789 Other cerebrovascular disease: Secondary | ICD-10-CM | POA: Diagnosis not present

## 2013-03-09 DIAGNOSIS — R531 Weakness: Secondary | ICD-10-CM

## 2013-03-09 DIAGNOSIS — S2329XA Dislocation of other parts of thorax, initial encounter: Secondary | ICD-10-CM | POA: Diagnosis not present

## 2013-03-09 DIAGNOSIS — H269 Unspecified cataract: Secondary | ICD-10-CM

## 2013-03-09 DIAGNOSIS — I10 Essential (primary) hypertension: Secondary | ICD-10-CM | POA: Diagnosis not present

## 2013-03-09 DIAGNOSIS — E785 Hyperlipidemia, unspecified: Secondary | ICD-10-CM | POA: Diagnosis present

## 2013-03-09 DIAGNOSIS — Z8673 Personal history of transient ischemic attack (TIA), and cerebral infarction without residual deficits: Secondary | ICD-10-CM | POA: Diagnosis not present

## 2013-03-09 DIAGNOSIS — R404 Transient alteration of awareness: Secondary | ICD-10-CM | POA: Diagnosis not present

## 2013-03-09 DIAGNOSIS — I6350 Cerebral infarction due to unspecified occlusion or stenosis of unspecified cerebral artery: Secondary | ICD-10-CM | POA: Diagnosis not present

## 2013-03-09 DIAGNOSIS — I639 Cerebral infarction, unspecified: Secondary | ICD-10-CM

## 2013-03-09 DIAGNOSIS — R2990 Unspecified symptoms and signs involving the nervous system: Secondary | ICD-10-CM

## 2013-03-09 LAB — URINALYSIS, ROUTINE W REFLEX MICROSCOPIC
Bilirubin Urine: NEGATIVE
Ketones, ur: NEGATIVE mg/dL
Nitrite: NEGATIVE
Protein, ur: NEGATIVE mg/dL
Specific Gravity, Urine: 1.008 (ref 1.005–1.030)
Urobilinogen, UA: 0.2 mg/dL (ref 0.0–1.0)

## 2013-03-09 LAB — URINE MICROSCOPIC-ADD ON

## 2013-03-09 MED ORDER — DIVALPROEX SODIUM 125 MG PO CPSP
375.0000 mg | ORAL_CAPSULE | Freq: Two times a day (BID) | ORAL | Status: DC
  Administered 2013-03-10 – 2013-03-12 (×5): 375 mg via ORAL
  Filled 2013-03-09 (×6): qty 3

## 2013-03-09 MED ORDER — DIVALPROEX SODIUM ER 250 MG PO TB24
250.0000 mg | ORAL_TABLET | Freq: Every day | ORAL | Status: DC
  Administered 2013-03-10: 250 mg via ORAL
  Filled 2013-03-09 (×3): qty 1

## 2013-03-09 MED ORDER — SODIUM CHLORIDE 0.9 % IV SOLN
INTRAVENOUS | Status: DC
  Administered 2013-03-09 – 2013-03-11 (×3): via INTRAVENOUS

## 2013-03-09 MED ORDER — RIVAROXABAN 15 MG PO TABS
15.0000 mg | ORAL_TABLET | Freq: Every day | ORAL | Status: DC
  Administered 2013-03-10: 15 mg via ORAL
  Filled 2013-03-09 (×3): qty 1

## 2013-03-09 MED ORDER — DIVALPROEX SODIUM 125 MG PO CPSP
125.0000 mg | ORAL_CAPSULE | Freq: Once | ORAL | Status: AC
  Administered 2013-03-10: 125 mg via ORAL
  Filled 2013-03-09: qty 1

## 2013-03-09 MED ORDER — SENNOSIDES-DOCUSATE SODIUM 8.6-50 MG PO TABS: 1.0000 | ORAL_TABLET | Freq: Every evening | ORAL | Status: DC | PRN

## 2013-03-09 NOTE — H&P (Signed)
[facility] History and Physical  [name] YNW:[id number] DOB: 1925-01-27 DOA: 03/09/2013  Referring physician: DR. Addison Lank in [place] [facility] PCP: Lemont Fillers., NP   Chief Complaint: Left-sided weakness  HPI: [name] is a 79 y.o. female with past medical history of previous CVA with resultant left-sided weakness, atrial fibrillation and hypertension. Patient came to the hospital because of left-sided weakness. Patient was in the shower her daughter was helping her, and she reported sudden left-sided weakness and loss of consciousness. 911 called and patient tries to [facility]. Patient had similar episode in the past and when patient comes to the hospital complete stroke workup will be done and it will show no acute findings. In the ED in [facility] regional MRI was done and showed acute/subacute infarct involving the left greater lobe with remote infarct in the right lenticular nucleus. Patient is on Xarelto, the case was discussed with Dr. Pearlean Brownie of [facility] neurology the patient's family wants to be in [place].  Review of Systems:  Constitutional: negative for anorexia, fevers and sweats Eyes: negative for irritation, redness and visual disturbance Ears, nose, mouth, throat, and face: negative for earaches, epistaxis, nasal congestion and sore throat Respiratory: negative for cough, dyspnea on exertion, sputum and wheezing Cardiovascular: negative for chest pain, dyspnea, lower extremity edema, orthopnea, palpitations and syncope Gastrointestinal: negative for abdominal pain, constipation, diarrhea, melena, nausea and vomiting Genitourinary:negative for dysuria, frequency and hematuria Hematologic/lymphatic: negative for bleeding, easy bruising and lymphadenopathy Musculoskeletal:negative for arthralgias, muscle weakness and stiff joints Neurological: Left-sided weakness and loss of consciousness. Endocrine: negative for diabetic symptoms  including polydipsia, polyuria and weight loss Allergic/Immunologic: negative for anaphylaxis, hay fever and urticaria   Past Medical History  Diagnosis Date  . Arthritis   . Hypertension   . Migraine   . Colon polyp   . Hearing difficulty   . Torus palatinus   . Atrial flutter January, 2012  . Stroke 08/02/12     right lenticular nucleus infarct  . Left leg weakness 02/05/2013  . Chronic anticoagulation   . High cholesterol    Past Surgical History  Procedure Laterality Date  . Cataract extraction w/ intraocular lens  implant, bilateral  2006-2008  . Ganglion cyst excision Bilateral 1938,1954,2003,2005    "wrists/hand" (08/01/2012)  . Cardioversion  05/19/2010    Dr. Jacinto Halim  . Tonsillectomy  ~ 1935  . Appendectomy  02/19/53    `   Social History:  reports that she has quit smoking. She has never used smokeless tobacco. She reports that she drinks alcohol. She reports that she does not use illicit drugs.  Allergies  Allergen Reactions  . Tramadol Hcl Other (See Comments)     lethargy, nausea  . Alendronate Sodium Rash and Other (See Comments)    puffy eyes    Family History  Problem Relation Age of Onset  . Colon cancer    . Breast cancer    . Brain cancer    . Cancer Mother     breast  . Aneurysm Mother     brain  . Heart attack Neg Hx   . Diabetes Neg Hx   . Hypertension Neg Hx      Prior to Admission medications   Medication Sig Start Date End Date Taking? Authorizing Provider  amLODipine (NORVASC) 2.5 MG tablet Take 1 tablet (2.5 mg total) by mouth daily. 03/02/13   Mcarthur Rossetti Angiulli, PA-C  aspirin-acetaminophen-caffeine (EXCEDRIN MIGRAINE) [phone] MG per tablet Take 2  tablets by mouth daily as needed for pain (for migraines).    Historical Provider, MD  atorvastatin (LIPITOR) 40 MG tablet Take 1 tablet (40 mg total) by mouth daily at 6 PM. 03/02/13   Mcarthur Rossetti Angiulli, PA-C  divalproex (DEPAKOTE ER) 250 MG 24 hr tablet Take 1 tablet (250 mg total) by mouth  daily. 03/02/13   Mcarthur Rossetti Angiulli, PA-C  Multiple Vitamin (MULTIVITAMIN WITH MINERALS) TABS Take 1 tablet by mouth daily.    Historical Provider, MD  pantoprazole (PROTONIX) 40 MG tablet Take 1 tablet (40 mg total) by mouth daily. 03/02/13   Mcarthur Rossetti Angiulli, PA-C  Rivaroxaban (XARELTO) 15 MG TABS tablet Take 1 tablet (15 mg total) by mouth daily with supper. 03/02/13   Mcarthur Rossetti Angiulli, PA-C  sodium chloride (OCEAN) 0.65 % SOLN nasal spray Place 1 spray into both nostrils as needed for congestion. 03/02/13   Mcarthur Rossetti Angiulli, PA-C   Physical Exam  BP 106/62  Pulse 66  Temp(Src) 98.3 F (36.8 C) (Oral)  Resp 16  Ht 5\' 7"  (1.702 m)  Wt 51.256 kg (113 lb)  BMI 17.69 kg/m2  SpO2 93%  General appearance: alert, cooperative and no distress  Head: Normocephalic, without obvious abnormality, atraumatic  Eyes: conjunctivae/corneas clear. PERRL, EOM's intact. Fundi benign.  Nose: Nares normal. Septum midline. Mucosa normal. No drainage or sinus tenderness.  Throat: lips, mucosa, and tongue normal; teeth and gums normal  Neck: Supple, no masses, no cervical lymphadenopathy, no JVD appreciated, no meningeal signs Resp: clear to auscultation bilaterally  Chest wall: no tenderness  Cardio: regular rate and rhythm, S1, S2 normal, no murmur, click, rub or gallop  GI: soft, non-tender; bowel sounds normal; no masses, no organomegaly  Extremities: extremities normal, atraumatic, no cyanosis or edema  Skin: Skin color, texture, turgor normal. No rashes or lesions  Neurologic: Alert and oriented X 3, left-sided weakness, upper left extremity at least +3, left lower extremity +2, gait was not tested  Labs on Admission:  Basic Metabolic Panel: No results found for this basename: NA, K, CL, CO2, GLUCOSE, BUN, CREATININE, CALCIUM, MG, PHOS,  in the last 168 hours Liver Function Tests: No results found for this basename: AST, ALT, ALKPHOS, BILITOT, PROT, ALBUMIN,  in the last 168 hours No results found  for this basename: LIPASE, AMYLASE,  in the last 168 hours No results found for this basename: AMMONIA,  in the last 168 hours CBC: No results found for this basename: WBC, NEUTROABS, HGB, HCT, MCV, PLT,  in the last 168 hours Cardiac Enzymes: No results found for this basename: CKTOTAL, CKMB, CKMBINDEX, TROPONINI,  in the last 168 hours  BNP (last 3 results) No results found for this basename: PROBNP,  in the last 8760 hours CBG: No results found for this basename: GLUCAP,  in the last 168 hours  Radiological Exams on Admission: No results found.  EKG: Independently reviewed.   Assessment/Plan Principal Problem:   CVA (cerebral vascular accident) Active Problems:   MIGRAINE HEADACHE   Atrial flutter   Orthostatic hypotension   Syncope   HTN (hypertension)   Chronic anticoagulation   Left-sided weakness   Acute stroke -According to the MRI done earlier today on [facility] left parietal lobe stroke. -Patient presented with a left-sided weakness which she started to improve now. -Not sure of the MRI findings can explain the left-sided weakness. -PT/OT/SLP to evaluate and treat. -I did not repeat CT scan or MRI as they're done earlier  today in [facility] regional, there is a CD with the results in the shadow chart. -I also did not repeat 2-D echo, carotid Dopplers as her done on 02/06/2013. Doubt is going to be any findings or change in management. -Neurology to evaluate.  Atrial fibrillation -Patient is on full and decannulation was Xarelto, per ED notes family is not interested to switch that to Coumadin. -Continue Xarelto, rate is controlled. -She's going to be on telemetry, check 12-lead EKG.  Syncope -Patient has history of syncopal episode secondary to orthostatic hypotension, give IV fluids. -She might have a collapse this time when she had her acute stroke.  Migraine headache  -Patient is on Depakote, continued.  Code Status: Full  code Family Communication: Plan discussed with the patient in the presence of her 3 daughters, family is very involved. Disposition Plan: Inpatient, telemetry  Time spent: 70 minutes  [facility] A [facility] Pager [phone]

## 2013-03-09 NOTE — Progress Notes (Signed)
Received call from Dr. Malva Cogan from [facility]. Pt presented with recent suspected CVA with L sided subparietal infarct. Currently on therapeutic anticoagulation. In the ED, hemiparesis improved. Dr. Addison Lank had discussed case with Dr. Pearlean Brownie. Pt remains unable to ambulate, therefore family unwilling to be discharged home. Instead, family requests transfer to [facility] for further tx. Accepted to med-tele floor.

## 2013-03-09 NOTE — Consult Note (Signed)
Neurology Consultation Reason for Consult: Stroke Referring Physician: Clydia Llano  CC: Left sided weakness   History is obtained from:Patient  HPI: [name] is a 79 y.o. female with a history of a right subcortical infarct who has presented multiple times recently for left hemiparesis. She collapsed today and was brought to the ER with similar symptoms, MRI shows LEFT parietal areas of restricted diffusion that are disconinuous.   Her daughter reports that she had total loss of consciousness and did not recover consciousness until she was in the ambulance.  Following her previous admissions for left hemiparesis with negative MRI, she has had recovery to her baseline compromise level of function over the course of days.   LKW: Early today tpa given?: no, theraputic anticoagulation    ROS: A 14 point ROS was performed and is negative except as noted in the HPI.  Past Medical History  Diagnosis Date  . Arthritis   . Hypertension   . Migraine   . Colon polyp   . Hearing difficulty   . Torus palatinus   . Atrial flutter January, 2012  . Stroke 08/02/12     right lenticular nucleus infarct  . Left leg weakness 02/05/2013  . Chronic anticoagulation   . High cholesterol     Family History: Mother - aneurysm  Social History: Tob: former smoker  Exam: Current vital signs: BP 127/70  Pulse 64  Temp(Src) 98.5 F (36.9 C) (Oral)  Resp 18  Ht 5\' 7"  (1.702 m)  Wt 51.256 kg (113 lb)  BMI 17.69 kg/m2  SpO2 97% Vital signs in last 24 hours: Temp:  [97.9 F (36.6 C)-98.5 F (36.9 C)] 98.5 F (36.9 C) (12/12 1946) Pulse Rate:  [64-91] 64 (12/12 1946) Resp:  [16-18] 18 (12/12 1946) BP: (106-131)/(58-70) 127/70 mmHg (12/12 1946) SpO2:  [93 %-97 %] 97 % (12/12 1946) Weight:  [51.256 kg (113 lb)] 51.256 kg (113 lb) (12/12 1619)  General: in bed, NAD CV: RRR Mental Status: Patient is awake, alert, oriented to person, place, month, year, and situation. Immediate and  remote memory are intact. Patient is able to give a clear and coherent history. No signs of aphasia or neglect Cranial Nerves: II: Visual Fields are full. Pupils are equal, round, and reactive to light.  Discs are difficult to visualize. III,IV, VI: EOMI without ptosis or diploplia.  V: Facial sensation is symmetric to temperature VII: Facial movement is notable for left-sided facial droop VIII: hearing is intact to voice X: Uvula elevates symmetrically XI: Shoulder shrug is symmetric. XII: tongue is midline without atrophy or fasciculations.  Motor: Tone is normal. Bulk is normal. 5/5 strength was present throughout the right, she has 4/5 strength in her left arm, though it is limited by recent clavicle fracture on that side. She has 3/5 strength in her left leg Sensory: Sensation is intact in the right, on the left she has decreased temperature sensation in the arm and leg Deep Tendon Reflexes: 2+ and symmetric in the biceps and patellae.  Cerebellar: FNF intact on right, limited on left do to clavicle fracture Gait: Not tested due to leg weakness   I have reviewed labs in epic and the results pertinent to this consultation are: A1c- 11/17 5.9 LDL 94 11/11  I have reviewed the images obtained:MRI brain 12/12- left parietal areas of restricted diffusion that are primarily restricted to the cortex.   Impression: 79 yo F with MRI with cortical change in the setting of an episode of loss  of consciousness today. I'm concerned for seizure activity as a precipitating event of these recurrent episodes of left-sided weakness. There is no clear focal motor activity, so whether this represents a "peeling the onion" phenomenon versus a Todd's phenomenon is unclear to me.  It is possible that she had a massive embolus causing loss of consciousness that then almost completely resolved with only very small distal branches affected permanently, though I feel that this is less likely the seizure as  an etiology for her MRI change.  Recommendations: 1) continue Xarelto 2) increase depakote to 375mg (15mg /kg/day) BID(will have to use sprinkles while here, but could use DR capsules on discharge) 3) EEG tomorrow 4) Repeat MRI tomorrow as well. If DWI changes have resolved, likely ictal effect rather than stroke.     Ritta Slot, MD [facility] [phone]  If 7pm- 7am, please page neurology on call at [phone].

## 2013-03-10 ENCOUNTER — Inpatient Hospital Stay (HOSPITAL_COMMUNITY): Payer: Medicare Other

## 2013-03-10 DIAGNOSIS — I4891 Unspecified atrial fibrillation: Secondary | ICD-10-CM | POA: Diagnosis not present

## 2013-03-10 DIAGNOSIS — I10 Essential (primary) hypertension: Secondary | ICD-10-CM | POA: Diagnosis not present

## 2013-03-10 DIAGNOSIS — E43 Unspecified severe protein-calorie malnutrition: Secondary | ICD-10-CM | POA: Insufficient documentation

## 2013-03-10 DIAGNOSIS — R55 Syncope and collapse: Secondary | ICD-10-CM

## 2013-03-10 DIAGNOSIS — M6281 Muscle weakness (generalized): Secondary | ICD-10-CM | POA: Diagnosis not present

## 2013-03-10 LAB — LIPID PANEL
HDL: 48 mg/dL (ref >39)
LDL Cholesterol: 33 mg/dL (ref 0–99)
Triglycerides: 53 mg/dL (ref <150)
VLDL: 11 mg/dL (ref 0–40)

## 2013-03-10 MED ORDER — ACETAMINOPHEN 325 MG PO TABS
650.0000 mg | ORAL_TABLET | Freq: Four times a day (QID) | ORAL | Status: DC | PRN
  Administered 2013-03-10 – 2013-03-11 (×2): 650 mg via ORAL
  Filled 2013-03-10 (×2): qty 2

## 2013-03-10 NOTE — Progress Notes (Signed)
PT Cancellation Note  Patient Details Name: [name] MRN: [id number] DOB: 02-Jul-1924   Cancelled Treatment:    Reason Eval/Treat Not Completed: Patient at procedure or test/unavailable. Pt leaving for MRI. PT to reattempt as able.   Marcene Brawn 03/10/2013, 10:10 AM

## 2013-03-10 NOTE — Progress Notes (Signed)
INITIAL NUTRITION ASSESSMENT  DOCUMENTATION CODES Per approved criteria  -Severe malnutrition in the context of chronic illness -Underweight   INTERVENTION: Magic cup TID with meals, each supplement provides 290 kcal and 9 grams of protein RD to follow for nutrition care plan  NUTRITION DIAGNOSIS: Increased nutrient needs related to malnutrition, repletion as evidenced by estimated nutrition needs  Goal: Pt to meet >/= 90% of their estimated nutrition needs   Monitor:  PO & supplemental intake, weight, labs, I/O's  Reason for Assessment: Malnutrition Screening Tool Report  79 y.o. female  Admitting Dx: CVA (cerebral vascular accident)  ASSESSMENT: Patient with PMH of previous CVA with resultant left-sided weakness, atrial fibrillation and HTN; presented with left-sided weakness and loss of consciousness; MRI showed acute/subacute infarct involving the left greater lobe with remote infarct in the right lenticular nucleus.  Patient known to Clinical Nutrition -- has had stay at [facility]; patient with hx of poor PO intake; visible muslce loss to upper body; RD spoke with patient's daughter [name]; her Mom doesn't care for [employer] supplements; Kim amenable to RD adding [employer] supplement to meal trays.  Patient meets criteria for severe malnutrition in the context of chronic illness as evidenced by < 75% intake of estimated energy requirement for > 1 month, 8% weight loss x 2 months and severe muscle loss (temples, clavicles).  Height: Ht Readings from Last 1 Encounters:  03/09/13 5\' 7"  (1.702 m)    Weight: Wt Readings from Last 1 Encounters:  03/09/13 113 lb (51.256 kg)    Ideal Body Weight: 135 lb  % Ideal Body Weight: 84%  Wt Readings from Last 10 Encounters:  03/09/13 113 lb (51.256 kg)  02/28/13 115 lb 6.4 oz (52.345 kg)  02/12/13 121 lb (54.885 kg)  02/05/13 121 lb (54.885 kg)  01/16/13 123 lb (55.792 kg)  11/14/12 121 lb 1.3 oz  (54.922 kg)  10/12/12 122 lb (55.339 kg)  08/18/12 123 lb (55.792 kg)  08/01/12 122 lb 5.7 oz (55.5 kg)  06/01/12 120 lb 13 oz (54.8 kg)    Usual Body Weight: 123 lb -- October 2014  % Usual Body Weight: 92%  BMI:  Body mass index is 17.69 kg/(m^2).  Estimated Nutritional Needs: Kcal: 1300-1500 Protein: 60-70 gm Fluid: >/= 1.5 L  Skin: Intact  Diet Order: General  EDUCATION NEEDS: -No education needs identified at this time  Scheduled Meds: . divalproex  250 mg Oral Daily  . divalproex  375 mg Oral Q12H  . Rivaroxaban  15 mg Oral Q supper    Continuous Infusions: . sodium chloride 75 mL/hr at 03/09/13 2103    Past Medical History  Diagnosis Date  . Arthritis   . Hypertension   . Migraine   . Colon polyp   . Hearing difficulty   . Torus palatinus   . Atrial flutter January, 2012  . Stroke 08/02/12     right lenticular nucleus infarct  . Left leg weakness 02/05/2013  . Chronic anticoagulation   . High cholesterol     Past Surgical History  Procedure Laterality Date  . Cataract extraction w/ intraocular lens  implant, bilateral  2006-2008  . Ganglion cyst excision Bilateral 1938,1954,2003,2005    "wrists/hand" (08/01/2012)  . Cardioversion  05/19/2010    Dr. Jacinto Halim  . Tonsillectomy  ~ 1935  . Appendectomy  02/19/53    `    Maureen Chatters, RD, LDN Pager #: [phone] After-Hours Pager #: [phone]

## 2013-03-10 NOTE — Evaluation (Signed)
Physical Therapy Evaluation Patient Details Name: [name] MRN: [id number] DOB: 07-14-1924 Today's Date: 03/10/2013 Time: 1308-6578 PT Time Calculation (min): 33 min  PT Assessment / Plan / Recommendation History of Present Illness    HPI: [name] is a 79 y.o. female with past medical history of previous CVA with resultant left-sided weakness, atrial fibrillation and hypertension. Patient came to the hospital because of left-sided weakness. Patient was in the shower her daughter was helping her, and she reported sudden left-sided weakness and loss of consciousness. 911 called and patient tries to [facility]. Patient had similar episode in the past and when patient comes to the hospital complete stroke workup will be done and it will show no acute findings.  In the ED in [facility] regional MRI was done and showed acute/subacute infarct involving the left greater lobe with remote infarct in the right lenticular nucleus.    Clinical Impression  Patient demonstrates deficits in functional mobility as indicated below. Patient will benefit from continued skilled PT to address deficits and maximize function. Will see as indicated. Recommend ST SNF upon discharge.     PT Assessment  Patient needs continued PT services    Follow Up Recommendations  SNF;Supervision/Assistance - 24 hour    Does the patient have the potential to tolerate intense rehabilitation      Barriers to Discharge Other (comment) (2nd floor bedroom)      Equipment Recommendations   (TBD)    Recommendations for Other Services     Frequency Min 2X/week    Precautions / Restrictions Precautions Precautions: Fall Precaution Comments: NWB of LUE, sling to LUE Required Braces or Orthoses: Sling Restrictions Weight Bearing Restrictions: Yes LUE Weight Bearing: Non weight bearing   Pertinent Vitals/Pain No pain at this time      Mobility  Bed Mobility Bed Mobility: Supine to Sit;Sitting -  Scoot to Edge of Bed Supine to Sit: 5: Supervision Sitting - Scoot to Edge of Bed: 5: Supervision Details for Bed Mobility Assistance: Assist for LLE and trunk, assist to rotate hips to EOB Transfers Transfers: Sit to Stand;Stand to Sit;Stand Pivot Transfers Sit to Stand: 3: Mod assist;From bed;From chair/3-in-1 Stand to Sit: 3: Mod assist Stand Pivot Transfers: 3: Mod assist Details for Transfer Assistance: Face to face, patient able to push up through RLE but unable to transfer weight through LLE or use LLE for support Ambulation/Gait Ambulation/Gait Assistance: 1: +2 Total assist Ambulation/Gait: Patient Percentage: 70% Ambulation Distance (Feet): 6 Feet Assistive device: 2 person hand held assist Gait Pattern: Step-to pattern;Left flexed knee in stance;Shuffle;Trunk flexed;Narrow base of support General Gait Details: LLE knee blocking    Exercises     PT Diagnosis: Difficulty walking;Generalized weakness  PT Problem List: Decreased strength;Decreased activity tolerance;Decreased balance;Decreased mobility PT Treatment Interventions: DME instruction;Gait training;Stair training;Functional mobility training;Therapeutic activities;Therapeutic exercise;Balance training     PT Goals(Current goals can be found in the care plan section) Acute Rehab PT Goals Patient Stated Goal: to be able to walk again PT Goal Formulation: With patient Time For Goal Achievement: 02/13/13 Potential to Achieve Goals: Fair  Visit Information  Last PT Received On: 03/10/13 Assistance Needed: +2 PT/OT/SLP Co-Evaluation/Treatment: Yes Reason for Co-Treatment: Complexity of the patient's impairments (multi-system involvement);For patient/therapist safety PT goals addressed during session: Mobility/safety with mobility;Balance Reason Eval/Treat Not Completed: Patient at procedure or test/unavailable       Prior Functioning  Home Living Family/patient expects to be discharged to:: Private  residence Living Arrangements: Children  Available Help at Discharge: Family;Available 24 hours/day;Available PRN/intermittently Type of Home: House Home Access: Stairs to enter [employer] of Steps: 1 Entrance Stairs-Rails: None Home Layout: Two level;Bed/bath upstairs Alternate Level Stairs-Number of Steps: 15 Alternate Level Stairs-Rails: Right (having chair lift installed) Home Equipment: Walker - 2 wheels;Shower seat;Transport chair;Grab bars - tub/shower  Lives With: Family Prior Function Level of Independence: Needs assistance Dominant Hand: Right    Cognition  Cognition Arousal/Alertness: Awake/alert Behavior During Therapy: WFL for tasks assessed/performed;Anxious Overall Cognitive Status: Within Functional Limits for tasks assessed    Extremity/Trunk Assessment Upper Extremity Assessment Upper Extremity Assessment: LUE deficits/detail LUE Deficits / Details: Left clavical fx LUE: Unable to fully assess due to pain Lower Extremity Assessment Lower Extremity Assessment: LLE deficits/detail LLE Deficits / Details: some weakness noted 3/5 (inconsistently) LLE Sensation:  (reports sensation to have improved from yesterday)   Balance Static Sitting Balance Static Sitting - Balance Support: Feet supported Static Sitting - Level of Assistance: 5: Stand by assistance Static Standing Balance Static Standing - Balance Support: During functional activity Static Standing - Level of Assistance: 3: Mod assist  End of Session PT - End of Session Equipment Utilized During Treatment: Gait belt Activity Tolerance: Patient tolerated treatment well Patient left: in chair;with call bell/phone within reach;with family/visitor present Nurse Communication: Mobility status  GP     Fabio Asa 03/10/2013, 2:01 PM Charlotte Crumb, PT DPT  [phone]

## 2013-03-10 NOTE — Progress Notes (Signed)
EEG completed; results pending.    

## 2013-03-10 NOTE — Progress Notes (Signed)
[facility] PROGRESS NOTE  [name] ZOX:[id number] DOB: 1924-08-01 DOA: 03/09/2013 PCP: Lemont Fillers., NP  Assessment/Plan: Acute stroke  -According to the MRI done earlier today on [facility] left parietal lobe stroke.  -Patient presented with a left-sided weakness -PT/OT/SLP to evaluate and treat.  -MRI done today shows: Restricted diffusion in the left temporal parietal lobe unchanged  from yesterday and most consistent with acute or subacute infarct.  No new areas of restricted diffusion. -did not repeat 2-D echo, carotid Dopplers as her done on 02/06/2013. Doubt is going to be any findings or change in management.  -Neurology following  Atrial fibrillation  -Patient is on  Xarelto, per ED notes family is not interested to switch that to Coumadin.  -Continue Xarelto, rate is controlled.   Syncope  -Patient has history of syncopal episode secondary to orthostatic hypotension, give IV fluids.   Migraine headache  -Patient is on Depakote, continued   Code Status: full Family Communication: patient Disposition Plan:    Consultants:  neuro  Procedures:    Antibiotics:    HPI/Subjective: Ready to eat No CP, no SOB  Objective: Filed Vitals:   03/10/13 0934  BP: 119/77  Pulse: 65  Temp: 97.4 F (36.3 C)  Resp: 18   No intake or output data in the 24 hours ending 03/10/13 1357 Filed Weights   03/09/13 1619  Weight: 51.256 kg (113 lb)    Exam:   General:  Pleasant/cooperative  Cardiovascular: rrr  Respiratory: clear anterior  Abdomen: +BS, soft, NT  Musculoskeletal: moves all 4 ext   Data Reviewed: Basic Metabolic Panel: No results found for this basename: NA, K, CL, CO2, GLUCOSE, BUN, CREATININE, CALCIUM, MG, PHOS,  in the last 168 hours Liver Function Tests: No results found for this basename: AST, ALT, ALKPHOS, BILITOT, PROT, ALBUMIN,  in the last 168 hours No results found for this basename:  LIPASE, AMYLASE,  in the last 168 hours No results found for this basename: AMMONIA,  in the last 168 hours CBC: No results found for this basename: WBC, NEUTROABS, HGB, HCT, MCV, PLT,  in the last 168 hours Cardiac Enzymes: No results found for this basename: CKTOTAL, CKMB, CKMBINDEX, TROPONINI,  in the last 168 hours BNP (last 3 results) No results found for this basename: PROBNP,  in the last 8760 hours CBG: No results found for this basename: GLUCAP,  in the last 168 hours  No results found for this or any previous visit (from the past 240 hour(s)).   Studies: Dg Chest 2 View  03/10/2013   CLINICAL DATA:  CVA.  EXAM: CHEST  2 VIEW  COMPARISON:  Chest radiograph performed earlier today at 9:37 a.m.  FINDINGS: The lungs are well-aerated and clear. There is no evidence of focal opacification, pleural effusion or pneumothorax.  The heart is borderline enlarged. No acute osseous abnormalities are seen. The previously noted distal left clavicular fracture and dislocation of the left sternoclavicular joint are not well characterized on this image.  IMPRESSION: Borderline cardiomegaly; lungs remain grossly clear.   Electronically Signed   By: Roanna Raider M.D.   On: 03/10/2013 00:29   Mr Brain Wo Contrast  03/10/2013   CLINICAL DATA:  Stroke  EXAM: MRI HEAD WITHOUT CONTRAST  TECHNIQUE: Multiplanar, multiecho pulse sequences of the brain and surrounding structures were obtained without intravenous contrast.  COMPARISON:  MRI 03/09/2013  FINDINGS: Restricted diffusion in the left parietal lobe is unchanged. This involves the temporoparietal white matter  and some small areas are restricted diffusion in the parietal cortex. No new areas of restricted diffusion are present.  Generalized atrophy. Chronic microvascular ischemic change throughout the white matter. Chronic lacunar infarction in the deep white matter on the right also unchanged. Chronic ischemic change in the thalami and pons bilaterally.  Small chronic infarcts in the right cerebellum.  Negative for hemorrhage or mass lesion. Vessels at the base of the brain are patent.  IMPRESSION: Restricted diffusion in the left temporal parietal lobe unchanged from yesterday and most consistent with acute or subacute infarct. No new areas of restricted diffusion.  Atrophy and moderately severe chronic ischemic changes again noted.   Electronically Signed   By: Marlan Palau M.D.   On: 03/10/2013 11:29    Scheduled Meds: . divalproex  250 mg Oral Daily  . divalproex  375 mg Oral Q12H  . Rivaroxaban  15 mg Oral Q supper   Continuous Infusions: . sodium chloride 75 mL/hr at 03/10/13 1347    Principal Problem:   CVA (cerebral vascular accident) Active Problems:   MIGRAINE HEADACHE   Atrial flutter   Orthostatic hypotension   Syncope   HTN (hypertension)   Chronic anticoagulation   Left-sided weakness   Protein-calorie malnutrition, severe    Time spent: 35 min    Jad Johansson  [facility] Pager [phone]. If 7PM-7AM, please contact night-coverage at [email or web address], password [facility] 03/10/2013, 1:57 PM  LOS: 1 day

## 2013-03-10 NOTE — Progress Notes (Signed)
PT Cancellation Note  Patient Details Name: [name] MRN: [id number] DOB: 1924-10-01   Cancelled Treatment:    Reason Eval/Treat Not Completed: Patient at procedure or test/unavailable. Pt undergoing EEG at this time. PT to return as able.    Marcene Brawn 03/10/2013, 8:32 AM

## 2013-03-10 NOTE — Progress Notes (Signed)
SLP Cancellation Note  Patient Details Name: [name] MRN: [id number] DOB: 11/12/1924   Cancelled treatment:          Patient at procedure or test/unavailable. Pt undergoing EEG at this time. Will continue efforts, will f/u this PM as able.    Chyrel Masson 03/10/2013, 9:15 AM

## 2013-03-10 NOTE — Procedures (Signed)
ELECTROENCEPHALOGRAM REPORT   Patient: [name]       Room #: 8J19 EEG No. ID: [id number] Age: 79 y.o.        Sex: female Referring Physician: Benjamine Mola Report Date:  03/10/2013        Interpreting Physician: Aline Brochure  History: [name] is an 79 y.o. female with a history of previous stroke presenting with recurrent episode of collapse associated with transient left hemiparesthesias.  Indications for study:  Rule out seizure disorder.  Technique: This is an 18 channel routine scalp EEG performed at the bedside with bipolar and monopolar montages arranged in accordance to the international 10/20 system of electrode placement.   Description: This EEG recording was performed during wakefulness. Predominant background activity consists of 9 Hz symmetrical alpha rhythm which attenuated well with eye opening. Photic stimulation was not performed. Hyperventilation was not performed. No epileptiform discharges were recorded. There was no abnormal slowing of cerebral activity.  Interpretation: This is a normal EEG recording during wakefulness. No evidence of an epileptic disorder was demonstrated.   Venetia Maxon M.D. [facility] [phone]

## 2013-03-10 NOTE — Evaluation (Signed)
Occupational Therapy Evaluation Patient Details Name: [name] MRN: [id number] DOB: July 12, 1924 Today's Date: 03/10/2013 Time: 9629-5284 OT Time Calculation (min): 33 min  OT Assessment / Plan / Recommendation History of present illness   [name] is a 79 y.o. female with past medical history of previous CVA with resultant left-sided weakness, atrial fibrillation and hypertension. Patient came to the hospital because of left-sided weakness. Patient was in the shower her daughter was helping her, and she reported sudden left-sided weakness and loss of consciousness. 911 called and patient tries to [facility] Patient had similar episode in the past and when patient comes to the hospital complete stroke workup will be done and it will show no acute findings.  In the ED in [facility] regional MRI was done and showed acute/subacute infarct involving the left greater lobe with remote infarct in the right lenticular nucleus.     Clinical Impression   Pt admitted with above. Will benefit from continued acute OT services to address below problem list. Recommending SNF for d/c planning.    OT Assessment  Patient needs continued OT Services    Follow Up Recommendations  SNF;Supervision/Assistance - 24 hour    Barriers to Discharge      Equipment Recommendations  None recommended by OT    Recommendations for Other Services    Frequency  Min 2X/week    Precautions / Restrictions Precautions Precautions: Fall Precaution Comments: NWB of LUE, sling to LUE Required Braces or Orthoses: Sling Restrictions Weight Bearing Restrictions: Yes LUE Weight Bearing: Non weight bearing   Pertinent Vitals/Pain See vitals    ADL  Grooming: Performed;Wash/dry hands;Wash/dry face;Supervision/safety Where Assessed - Grooming: Unsupported sitting Upper Body Bathing: Simulated;Moderate assistance Where Assessed - Upper Body Bathing: Unsupported sitting Lower Body Bathing:  Simulated;Moderate assistance Where Assessed - Lower Body Bathing: Supported sit to stand Upper Body Dressing: Simulated;Moderate assistance Where Assessed - Upper Body Dressing: Unsupported sitting Lower Body Dressing: Simulated;Moderate assistance Where Assessed - Lower Body Dressing: Supported sit to stand Toilet Transfer: Chief of Staff: Patient Percentage: 70% Statistician Method: Sit to Barista:  (bed ambulating to chair) Equipment Used: Gait belt Transfers/Ambulation Related to ADLs: Mod assist for sit<>stand.  Requires +2 total assist for ambulating due to left LE weakness. Pt also becomes very anxious/fearful of falling during mobility and becomes unsafe due to fear. ADL Comments: Discussed with family that pt would need assist for stand pivot transfer at home (has a transfer chair at home). Daughters are concerned that they will not be able to provide this level of physical assist at home.  They had been assisting pt at home for a few days just prior to this admission, but they state pt was mobilizing better than she is now.    OT Diagnosis: Generalized weakness;Paresis  OT Problem List: Decreased strength;Decreased activity tolerance;Impaired balance (sitting and/or standing);Decreased knowledge of use of DME or AE;Impaired UE functional use OT Treatment Interventions: Self-care/ADL training;Neuromuscular education;DME and/or AE instruction;Therapeutic activities;Patient/family education;Balance training   OT Goals(Current goals can be found in the care plan section) Acute Rehab OT Goals Patient Stated Goal: to go home OT Goal Formulation: With patient/family Time For Goal Achievement: 03/24/13 Potential to Achieve Goals: Good  Visit Information  Last OT Received On: 03/10/13 Assistance Needed: +2 PT/OT/SLP Co-Evaluation/Treatment: Yes Reason for Co-Treatment: Complexity of the patient's impairments (multi-system  involvement);For patient/therapist safety PT goals addressed during session: Mobility/safety with mobility;Balance OT goals addressed during session: ADL's  and self-care       Prior Functioning     Home Living Family/patient expects to be discharged to:: Private residence Living Arrangements: Children Available Help at Discharge: Family;Available 24 hours/day;Available PRN/intermittently Type of Home: House Home Access: Stairs to enter [employer] of Steps: 1 Entrance Stairs-Rails: None Home Layout: Two level;Bed/bath upstairs Alternate Level Stairs-Number of Steps: 15 Alternate Level Stairs-Rails: Right (having chair lift installed) Home Equipment: Walker - 2 wheels;Shower seat;Transport chair;Grab bars - tub/shower  Lives With: Family Prior Function Level of Independence: Needs assistance Dominant Hand: Right         Vision/Perception Vision - History Baseline Vision: Wears glasses all the time Vision - Assessment Eye Alignment: Within Functional Limits   Cognition  Cognition Arousal/Alertness: Awake/alert Behavior During Therapy: WFL for tasks assessed/performed;Anxious (anxious with mobility) Overall Cognitive Status: Within Functional Limits for tasks assessed    Extremity/Trunk Assessment Upper Extremity Assessment Upper Extremity Assessment: LUE deficits/detail LUE Deficits / Details: Did not test strength due to left clavicle fx (sustained 02/21/13).  Able to perform shoulder flexion AROM to 80 degrees. LUE: Unable to fully assess due to pain Lower Extremity Assessment Lower Extremity Assessment: LLE deficits/detail LLE Deficits / Details: some weakness noted 3/5 (inconsistently) LLE Sensation:  (reports sensation to have improved from yesterday)     Mobility Bed Mobility Bed Mobility: Supine to Sit;Sitting - Scoot to Edge of Bed Supine to Sit: 5: Supervision Sitting - Scoot to [place] of Bed: 5: Supervision Details for Bed Mobility Assistance:  Assist for LLE and trunk, assist to rotate hips to EOB Transfers Transfers: Sit to Stand;Stand to Sit Sit to Stand: 3: Mod assist;From bed;With upper extremity assist;From chair/3-in-1;With armrests Stand to Sit: 3: Mod assist;To chair/3-in-1;To bed;With armrests;With upper extremity assist Details for Transfer Assistance: Face to face, patient able to push up through RLE but unable to transfer weight through LLE or use LLE for support     Exercise     Balance Balance Balance Assessed: Yes Static Sitting Balance Static Sitting - Balance Support: Feet supported Static Sitting - Level of Assistance: 5: Stand by assistance Static Standing Balance Static Standing - Balance Support: During functional activity Static Standing - Level of Assistance: 3: Mod assist   End of Session OT - End of Session Equipment Utilized During Treatment: Gait belt Activity Tolerance: Patient tolerated treatment well Patient left: in chair;with call bell/phone within reach;with family/visitor present  GO    03/10/2013 Cipriano Mile OTR/L Pager [phone] Office [phone]  Cipriano Mile 03/10/2013, 2:50 PM

## 2013-03-10 NOTE — Evaluation (Signed)
Speech Language Pathology Evaluation Patient Details Name: [name] MRN: [id number] DOB: 05/03/1924 Today's Date: 03/10/2013 Time: 7829-5621 SLP Time Calculation (min): 21 min  Problem List:  Patient Active Problem List   Diagnosis Date Noted  . Left-sided weakness 03/09/2013  . Subcortical infarction 02/16/2013  . Possible Seizures 02/14/2013  . TIA (transient ischemic attack) 02/12/2013  . Stroke-like symptom 02/12/2013  . Hyperglycemia 02/12/2013  . High cholesterol   . CVA (cerebral vascular accident) 02/05/2013  . Left leg weakness 02/05/2013  . Atrial fibrillation 02/05/2013  . Chronic anticoagulation 02/05/2013  . HTN (hypertension) 11/14/2012  . right brain stroke too small to be seen on MRI 08/02/2012  . Syncope 06/01/2012  . Right ureteral stone 09/15/2010  . Shoulder pain 08/19/2010  . Atrial flutter 07/17/2010  . Orthostatic hypotension 07/17/2010  . OSTEOPENIA 06/03/2009  . MIGRAINE HEADACHE 02/05/2008  . CATARACTS, BILATERAL 02/05/2008  . DECREASED HEARING 02/05/2008  . GEN OSTEOARTHROSIS INVOLVING MULTIPLE SITES 02/05/2008  . COLONIC POLYPS, HX OF 02/05/2008   Past Medical History:  Past Medical History  Diagnosis Date  . Arthritis   . Hypertension   . Migraine   . Colon polyp   . Hearing difficulty   . Torus palatinus   . Atrial flutter January, 2012  . Stroke 08/02/12     right lenticular nucleus infarct  . Left leg weakness 02/05/2013  . Chronic anticoagulation   . High cholesterol    Past Surgical History:  Past Surgical History  Procedure Laterality Date  . Cataract extraction w/ intraocular lens  implant, bilateral  2006-2008  . Ganglion cyst excision Bilateral 1938,1954,2003,2005    "wrists/hand" (08/01/2012)  . Cardioversion  05/19/2010    Dr. Jacinto Halim  . Tonsillectomy  ~ 1935  . Appendectomy  02/19/53    `   HPI:  Pt is a 79 y.o. female with a history of atrial fibrillation, hypertension, and a right subcortical infarct who has  presented multiple times recently for left hemiparesis. She collapsed today and was brought to the ER with similar symptoms, MRI shows LEFT parietal areas of restricted diffusion that are discontinuous. Her daughter reports that she had total loss of consciousness and did not recover consciousness until she was in the ambulance. Following her previous admissions for left hemiparesis with negative MRI, she has had recovery to her baseline compromise level of function over the course of days. Pt noted to have hearing difficulty.    Assessment / Plan / Recommendation Clinical Impression  Pt presents with functional cognitive linguistic skills that are age appropriate. Mild deficit noted with memory recall, improved with min verbal cues, per pt and family this is her baseline function and is age appropriate. Pt with family support needed at home. No skilled ST services indicated, SLP signing off.     SLP Assessment  Patient does not need any further Speech Lanaguage Pathology Services    Follow Up Recommendations  None    Frequency and Duration        Pertinent Vitals/Pain No complaints    SLP Evaluation Prior Functioning  Cognitive/Linguistic Baseline: Within functional limits Type of Home: House  Lives With: Family Available Help at Discharge: Family;Available 24 hours/day;Available PRN/intermittently Education: 2 years college Vocation: Retired   IT consultant  Overall Cognitive Status: Within Functional Limits for tasks assessed Arousal/Alertness: Awake/alert Orientation Level: Oriented X4 Attention: Alternating Selective Attention: Appears intact Alternating Attention: Appears intact Memory: Impaired Memory Impairment: Decreased recall of new information;Retrieval deficit (Age appropriate ) Awareness: Appears intact  Problem Solving: Appears intact Executive Function: Reasoning;Sequencing;Organizing;Decision Making Reasoning: Appears intact Sequencing: Appears intact Organizing:  Appears intact Decision Making: Appears intact Safety/Judgment: Appears intact    Comprehension  Auditory Comprehension Overall Auditory Comprehension: Appears within functional limits for tasks assessed Yes/No Questions: Within Functional Limits Commands: Within Functional Limits Conversation: Complex Visual Recognition/Discrimination Discrimination: Not tested Reading Comprehension Reading Status: Within funtional limits    Expression Expression Primary Mode of Expression: Verbal Verbal Expression Overall Verbal Expression: Appears within functional limits for tasks assessed Initiation: No impairment Level of Generative/Spontaneous Verbalization: Conversation Repetition: No impairment Naming: No impairment Pragmatics: No impairment Written Expression Dominant Hand: Right Written Expression: Within Functional Limits   Oral / Motor Oral Motor/Sensory Function Overall Oral Motor/Sensory Function: Appears within functional limits for tasks assessed Motor Speech Overall Motor Speech: Appears within functional limits for tasks assessed Respiration: Within functional limits Phonation: Normal Resonance: Within functional limits Articulation: Within functional limitis Intelligibility: Intelligible Motor Planning: Not tested   GO     Marena Chancy, Edmon Crape MA CCC-SLP 03/10/2013, 10:21 AM

## 2013-03-10 NOTE — Progress Notes (Signed)
Subjective: Patient had no new complaints. She's had no recurrence of loss of consciousness.   Objective: Current vital signs: BP 125/79  Pulse 64  Temp(Src) 97.6 F (36.4 C) (Oral)  Resp 18  Ht 5\' 7"  (1.702 m)  Wt 51.256 kg (113 lb)  BMI 17.69 kg/m2  SpO2 98%  Neurologic Exam: Alert and in no acute distress. Mental status was normal. Extraocular movements were full and conjugate. No facial weakness noted. Patient moved extremities equally with normal strength throughout.  Medications: I have reviewed the patient's current medications.  MRI of the brain on 03/10/2013 show restricted diffusion in the left temporal and parietal lobes unchanged from study on previous day and most consistent with acute or subacute infarction.  EEG on 03/10/2013 was normal.  Assessment/Plan: Acute left parietotemporal cerebral infarction. Etiology for loss of consciousness is unclear. Recurrence of seizure at the onset of stroke cannot be ruled out. Patient has not had a recurrence of seizure activity an EEG was normal.  Recommend no anticonvulsant medication at this point. Patient will need to undergo full stroke risk assessment, to include MRA, carotid Doppler study, 2D echocardiogram, fasting lipid panel and hemoglobin A1c. Also recommend physical therapy and occupational therapy consults.  C.R. Roseanne Reno, MD [facility]  03/10/2013  6:58 PM

## 2013-03-11 DIAGNOSIS — I4891 Unspecified atrial fibrillation: Secondary | ICD-10-CM | POA: Diagnosis not present

## 2013-03-11 DIAGNOSIS — I10 Essential (primary) hypertension: Secondary | ICD-10-CM | POA: Diagnosis not present

## 2013-03-11 DIAGNOSIS — G43909 Migraine, unspecified, not intractable, without status migrainosus: Secondary | ICD-10-CM | POA: Diagnosis not present

## 2013-03-11 LAB — URINE CULTURE

## 2013-03-11 MED ORDER — BISACODYL 10 MG RE SUPP
10.0000 mg | Freq: Every day | RECTAL | Status: DC | PRN
  Administered 2013-03-11: 10 mg via RECTAL
  Filled 2013-03-11: qty 1

## 2013-03-11 MED ORDER — DOCUSATE SODIUM 100 MG PO CAPS
100.0000 mg | ORAL_CAPSULE | Freq: Two times a day (BID) | ORAL | Status: DC
  Administered 2013-03-11 – 2013-03-12 (×3): 100 mg via ORAL
  Filled 2013-03-11 (×3): qty 1

## 2013-03-11 MED ORDER — RIVAROXABAN 15 MG PO TABS
15.0000 mg | ORAL_TABLET | Freq: Every day | ORAL | Status: DC
  Administered 2013-03-11: 15 mg via ORAL
  Filled 2013-03-11 (×2): qty 1

## 2013-03-11 MED ORDER — RIVAROXABAN 20 MG PO TABS: 20.0000 mg | ORAL_TABLET | Freq: Every day | ORAL | Status: DC

## 2013-03-11 MED ORDER — GLYCERIN (LAXATIVE) 2.1 G RE SUPP
1.0000 | Freq: Every day | RECTAL | Status: DC | PRN
  Filled 2013-03-11: qty 1

## 2013-03-11 NOTE — Progress Notes (Signed)
Stroke Team Progress Note  HISTORY [name] is an 79 y.o. female with a history of a right subcortical infarct who has presented multiple times recently for left hemiparesis. She collapsed today, 03/09/2013 , and was brought to the ER with similar symptoms, MRI shows LEFT parietal areas of restricted diffusion that are disconinuous.  Her daughter reported that she had total loss of consciousness and did not recover consciousness until she was in the ambulance.  Following her previous admissions for left hemiparesis with negative MRI, she has had recovery to her baseline compromise level of function over the course of days.   LKW: Early on 03/09/2013 tpa given?: no, theraputic anticoagulation   SUBJECTIVE The patient's 3 daughters are in the room this morning. They all had multiple questions which were answered. The patient feels stronger today. She has better movement on her left side.   OBJECTIVE Most recent Vital Signs: Filed Vitals:   03/10/13 1752 03/10/13 2030 03/11/13 0000 03/11/13 0650  BP: 125/79 149/67 147/80 133/81  Pulse: 64 70 81 66  Temp: 97.6 F (36.4 C) 98.5 F (36.9 C) 97.8 F (36.6 C) 97.9 F (36.6 C)  TempSrc: Oral Oral Oral Oral  Resp: 18 20 18 18   Height:      Weight:      SpO2: 98% 98% 96% 96%   CBG (last 3)  No results found for this basename: GLUCAP,  in the last 72 hours  IV Fluid Intake:     MEDICATIONS  . divalproex  250 mg Oral Daily  . divalproex  375 mg Oral Q12H  . Rivaroxaban  15 mg Oral Q supper   PRN:  acetaminophen, senna-docusate  Diet:  General thin liquids Activity:  Up with assistance DVT Prophylaxis:  Xarelto / SCDs  CLINICALLY SIGNIFICANT STUDIES Basic Metabolic Panel: No results found for this basename: NA, K, CL, CO2, GLUCOSE, BUN, CREATININE, CALCIUM, MG, PHOS,  in the last 168 hours Liver Function Tests: No results found for this basename: AST, ALT, ALKPHOS, BILITOT, PROT, ALBUMIN,  in the last 168 hours CBC: No results  found for this basename: WBC, NEUTROABS, HGB, HCT, MCV, PLT,  in the last 168 hours Coagulation: No results found for this basename: LABPROT, INR,  in the last 168 hours Cardiac Enzymes: No results found for this basename: CKTOTAL, CKMB, CKMBINDEX, TROPONINI,  in the last 168 hours Urinalysis:  Recent Labs Lab 03/09/13 2250  COLORURINE YELLOW  LABSPEC 1.008  PHURINE 7.5  GLUCOSEU NEGATIVE  HGBUR NEGATIVE  BILIRUBINUR NEGATIVE  KETONESUR NEGATIVE  PROTEINUR NEGATIVE  UROBILINOGEN 0.2  NITRITE NEGATIVE  LEUKOCYTESUR SMALL*   Lipid Panel    Component Value Date/Time   CHOL 92 03/10/2013 0420   TRIG 53 03/10/2013 0420   HDL 48 03/10/2013 0420   CHOLHDL 1.9 03/10/2013 0420   VLDL 11 03/10/2013 0420   LDLCALC 33 03/10/2013 0420   HgbA1C  Lab Results  Component Value Date   HGBA1C 5.9* 02/12/2013    Urine Drug Screen:     Component Value Date/Time   LABOPIA NONE DETECTED 02/12/2013 1046   COCAINSCRNUR NONE DETECTED 02/12/2013 1046   LABBENZ NONE DETECTED 02/12/2013 1046   AMPHETMU NONE DETECTED 02/12/2013 1046   THCU NONE DETECTED 02/12/2013 1046   LABBARB NONE DETECTED 02/12/2013 1046    Alcohol Level: No results found for this basename: ETH,  in the last 168 hours  Dg Chest 2 View 03/10/2013    Borderline cardiomegaly; lungs remain grossly clear.  Mr Brain Wo Contrast 03/10/2013    Restricted diffusion in the left temporal parietal lobe unchanged from yesterday, 03/09/2013, and most consistent with acute or subacute infarct. No new areas of restricted diffusion.  Atrophy and moderately severe chronic ischemic changes again noted.     EEG - This is a normal EEG recording during wakefulness. No evidence of an epileptic disorder was demonstrated.   2D Echocardiogram  02/06/2013 - ejection fraction 55-60%. No source of emboli identified.  Carotid Doppler   02/06/2013 - Findings suggest 1-39% internal carotid artery stenosis bilaterally. Vertebral arteries are  patent with antegrade flow.    EKG  atrial flutter  Therapy Recommendations - skilled nursing facility recommended  Physical Exam    Mental Status:  Patient is awake, alert, oriented to person, place, month, year, and situation.  Immediate and remote memory are intact.  Patient is able to give a clear and coherent history.  No signs of aphasia or neglect  Cranial Nerves:  II: Visual Fields are full. Pupils are equal, round, and reactive to light. Discs are difficult to visualize.  III,IV, VI: EOMI without ptosis or diploplia.  V: Facial sensation is symmetric to temperature  VII: Facial movement is notable for left-sided facial droop  VIII: hearing is intact to voice  X: Uvula elevates symmetrically  XI: Shoulder shrug is symmetric.  XII: tongue is midline without atrophy or fasciculations.  Motor:  Tone is normal. Bulk is normal. 5/5 strength was present throughout the right, she has 4/5 strength in her left arm, though it is limited by recent clavicle fracture on that side. She has 3/5 strength in her left leg  Sensory:  Sensation is intact in the right, on the left she has decreased temperature sensation in the arm and leg  Deep Tendon Reflexes:  2+ and symmetric in the biceps and patellae.  Cerebellar:  FNF intact on right, limited on left do to clavicle fracture  Gait:  Not tested due to leg weakness   ASSESSMENT [name] is a 79 y.o. female presenting with left hemiparesis. TPA was not given as the patient was on Xarelto prior to admission.  An MRI was consistent with a left temporal parietal acute to subacute infarct. Infarct felt to be embolic secondary to atrial flutter.On Xarelto 15mg  daily prior to admission. Now on Xarelto 15mg  daily for secondary stroke prevention. Patient with resultant left hemiparesis. Work up complete.   Atrial fib / flutter - Xarelto  Hypertension / Orthostatic hypotension per family  Previous right subcortical  infarct.  Hyperlipidemia  Possible seizure activity - normal EEG this admission as above - on Depakote.  Left CVA as above but left hemiparesis. ? Related to previous right subcortical infarct.  Hospital day # 2  TREATMENT/PLAN  Continue Xarelto for secondary stroke prevention.  Risk factor modification  Skilled nursing facility recommended by the therapists - family plans to take patient home.  Delton See PA-C [facility] Pager [phone] 03/11/2013, 10:22 AM  I evaluated and examined patient, reviewed records, labs and imaging, and agree with note and plan. Syncopal event at home could have been hypotensive event, stroke, or less likely seizure. Will need close monitoring of orthostatic vitals here and at home. Has been on midodrine and florinef in the past (via Dr. Jacinto Halim). Also, consider dose adjustment of xarelto (was originally on 20mg  daily, then reduced to 15mg  based on renal function decline in May 2014; now renal function better. May consider going back up  to 20mg  daily or consider change to eliquis). Discussed with Dr. Benjamine Mola.  Suanne Marker, MD 03/11/2013, 5:22 PM Certified in Neurology, Neurophysiology and Neuroimaging [facility] - Stroke Team  Please refer to [email or web address] for on-call Stroke MD

## 2013-03-11 NOTE — Progress Notes (Signed)
Patient with large BM after dulcolax supp, feels "much better."

## 2013-03-11 NOTE — Progress Notes (Addendum)
[facility] PROGRESS NOTE  [name] WUX:[id number] DOB: [birth year] DOA: 03/09/2013 PCP: Lemont Fillers., NP  Assessment/Plan: Acute stroke  -According to the MRI done earlier today on [facility] left parietal lobe stroke.  -Patient presented with a left-sided weakness -PT/OT/SLP to evaluate and treat.  -MRI done today shows: Restricted diffusion in the left temporal parietal lobe unchanged  from yesterday and most consistent with acute or subacute infarct.  No new areas of restricted diffusion. -did not repeat 2-D echo, carotid Dopplers as her done on 02/06/2013. Doubt is going to be any findings or change in management.  -Neurology following  Atrial fibrillation  -Patient is on  Xarelto, per ED notes family is not interested to switch that to Coumadin.  -Continue Xarelto- needs lower dose but still having episodes on meds-   Syncope  -Patient has history of syncopal episode secondary to orthostatic hypotension, give IV fluids.   Migraine headache  -Patient is on Depakote, continued   Code Status: full Family Communication: patient Disposition Plan:    Consultants:  neuro  Procedures:    Antibiotics:    HPI/Subjective: Eating well No SOB, no CP  Objective: Filed Vitals:   03/11/13 0650  BP: 133/81  Pulse: 66  Temp: 97.9 F (36.6 C)  Resp: 18    Intake/Output Summary (Last 24 hours) at 03/11/13 2536 Last data filed at 03/11/13 0600  Gross per 24 hour  Intake    825 ml  Output    625 ml  Net    200 ml   Filed Weights   03/09/13 1619  Weight: 51.256 kg (113 lb)    Exam:   General:  Pleasant/cooperative  Cardiovascular: rrr  Respiratory: clear anterior  Abdomen: +BS, soft, NT  Musculoskeletal: moves all 4 ext   Data Reviewed: Basic Metabolic Panel: No results found for this basename: NA, K, CL, CO2, GLUCOSE, BUN, CREATININE, CALCIUM, MG, PHOS,  in the last 168 hours Liver Function Tests: No  results found for this basename: AST, ALT, ALKPHOS, BILITOT, PROT, ALBUMIN,  in the last 168 hours No results found for this basename: LIPASE, AMYLASE,  in the last 168 hours No results found for this basename: AMMONIA,  in the last 168 hours CBC: No results found for this basename: WBC, NEUTROABS, HGB, HCT, MCV, PLT,  in the last 168 hours Cardiac Enzymes: No results found for this basename: CKTOTAL, CKMB, CKMBINDEX, TROPONINI,  in the last 168 hours BNP (last 3 results) No results found for this basename: PROBNP,  in the last 8760 hours CBG: No results found for this basename: GLUCAP,  in the last 168 hours  Recent Results (from the past 240 hour(s))  URINE CULTURE     Status: None   Collection Time    03/09/13 10:50 PM      Result Value Range Status   Specimen Description URINE, CLEAN CATCH   Final   Special Requests NONE   Final   Culture  Setup Time     Final   Value: 03/10/2013 11:40     Performed at [facility] Count     Final   Value: 3,000 COLONIES/ML     Performed at [facility]   Culture     Final   Value: INSIGNIFICANT GROWTH     Performed at [facility]   Report Status 03/11/2013 FINAL   Final     Studies: Dg Chest 2 View  03/10/2013  CLINICAL DATA:  CVA.  EXAM: CHEST  2 VIEW  COMPARISON:  Chest radiograph performed earlier today at 9:37 a.m.  FINDINGS: The lungs are well-aerated and clear. There is no evidence of focal opacification, pleural effusion or pneumothorax.  The heart is borderline enlarged. No acute osseous abnormalities are seen. The previously noted distal left clavicular fracture and dislocation of the left sternoclavicular joint are not well characterized on this image.  IMPRESSION: Borderline cardiomegaly; lungs remain grossly clear.   Electronically Signed   By: Roanna Raider M.D.   On: 03/10/2013 00:29   Mr Brain Wo Contrast  03/10/2013   CLINICAL DATA:  Stroke  EXAM: MRI HEAD WITHOUT CONTRAST  TECHNIQUE:  Multiplanar, multiecho pulse sequences of the brain and surrounding structures were obtained without intravenous contrast.  COMPARISON:  MRI 03/09/2013  FINDINGS: Restricted diffusion in the left parietal lobe is unchanged. This involves the temporoparietal white matter and some small areas are restricted diffusion in the parietal cortex. No new areas of restricted diffusion are present.  Generalized atrophy. Chronic microvascular ischemic change throughout the white matter. Chronic lacunar infarction in the deep white matter on the right also unchanged. Chronic ischemic change in the thalami and pons bilaterally. Small chronic infarcts in the right cerebellum.  Negative for hemorrhage or mass lesion. Vessels at the base of the brain are patent.  IMPRESSION: Restricted diffusion in the left temporal parietal lobe unchanged from yesterday and most consistent with acute or subacute infarct. No new areas of restricted diffusion.  Atrophy and moderately severe chronic ischemic changes again noted.   Electronically Signed   By: Marlan Palau M.D.   On: 03/10/2013 11:29    Scheduled Meds: . divalproex  250 mg Oral Daily  . divalproex  375 mg Oral Q12H  . Rivaroxaban  15 mg Oral Q supper   Continuous Infusions:    Principal Problem:   CVA (cerebral vascular accident) Active Problems:   MIGRAINE HEADACHE   Atrial flutter   Orthostatic hypotension   Syncope   HTN (hypertension)   Chronic anticoagulation   Left-sided weakness   Protein-calorie malnutrition, severe    Time spent: 35 min    Riku Buttery  [facility] Pager [phone]. If 7PM-7AM, please contact night-coverage at [email or web address], password [facility] 03/11/2013, 9:17 AM  LOS: 2 days

## 2013-03-12 ENCOUNTER — Ambulatory Visit: Payer: Medicare Other | Admitting: Family

## 2013-03-12 DIAGNOSIS — I6350 Cerebral infarction due to unspecified occlusion or stenosis of unspecified cerebral artery: Secondary | ICD-10-CM

## 2013-03-12 DIAGNOSIS — R55 Syncope and collapse: Secondary | ICD-10-CM

## 2013-03-12 DIAGNOSIS — E43 Unspecified severe protein-calorie malnutrition: Secondary | ICD-10-CM

## 2013-03-12 LAB — CBC
HCT: 37.3 % (ref 36.0–46.0)
Hemoglobin: 11.9 g/dL — ABNORMAL LOW (ref 12.0–15.0)
MCH: 29.3 pg (ref 26.0–34.0)
MCV: 91.9 fL (ref 78.0–100.0)
Platelets: 200 10*3/uL (ref 150–400)
RDW: 14 % (ref 11.5–15.5)
WBC: 4.5 10*3/uL (ref 4.0–10.5)

## 2013-03-12 LAB — BASIC METABOLIC PANEL
CO2: 24 mEq/L (ref 19–32)
Calcium: 8.6 mg/dL (ref 8.4–10.5)
Chloride: 109 mEq/L (ref 96–112)
Creatinine, Ser: 0.67 mg/dL (ref 0.50–1.10)
GFR calc Af Amer: 90 mL/min — ABNORMAL LOW (ref >90)
Glucose, Bld: 79 mg/dL (ref 70–99)

## 2013-03-12 MED ORDER — APIXABAN 2.5 MG PO TABS: 2.5000 mg | ORAL_TABLET | Freq: Two times a day (BID) | ORAL | Status: DC

## 2013-03-12 MED ORDER — DIVALPROEX SODIUM 125 MG PO CPSP: 375.0000 mg | ORAL_CAPSULE | Freq: Two times a day (BID) | ORAL | Status: DC

## 2013-03-12 MED ORDER — APIXABAN 2.5 MG PO TABS
2.5000 mg | ORAL_TABLET | Freq: Two times a day (BID) | ORAL | Status: DC
  Filled 2013-03-12 (×2): qty 1

## 2013-03-12 NOTE — Progress Notes (Signed)
Eliquis coupon given to family members; CM talked to patient with daughters present; patient is active with [facility] services; [facility] with [facility] called for arrangements; [address] Filter RN,BSN

## 2013-03-12 NOTE — Progress Notes (Signed)
Discharge orders received, pt for discharge home today with home health PT, IV D/C,  D/C instructions and Rx given with verbalized understanding.  Family at bedside to assist pt with discharge. Staff brought pt downstairs via wheelchair.  

## 2013-03-12 NOTE — Progress Notes (Signed)
Physical Therapy Treatment Patient Details Name: [name] MRN: [id number] DOB: Feb 23, 1925 Today's Date: 03/12/2013 Time: 4098-1191 PT Time Calculation (min): 20 min  PT Assessment / Plan / Recommendation  History of Present Illness Admitted with sudden onset of L side weakness and LOC.  Pt with hx of CVA and recent L clavicle fx.   PT Comments   Patient and family wishing to dc home, performed family education and training for ambulation and assist. Daughter able to ambulate pt with assist. Cues for safety, use of gait belt, and positioning reviewed. Given discharge changes, recommend HHPT upon discharge.   Follow Up Recommendations  Home health PT;Supervision/Assistance - 24 hour                 Frequency Min 4X/week   Progress towards PT Goals Progress towards PT goals: Progressing toward goals  Plan Discharge plan needs to be updated    Precautions / Restrictions Precautions Precautions: Fall Precaution Comments: L clavicle fx, pt is not to lift, push, pull with arm Required Braces or Orthoses: Sling Restrictions Weight Bearing Restrictions: Yes   Pertinent Vitals/Pain No pain at this time    Mobility  Bed Mobility Bed Mobility: Supine to Sit;Sitting - Scoot to Edge of Bed Rolling Right: 4: Min assist Right Sidelying to Sit: 4: Min assist Sitting - Scoot to [place] of Bed: 5: Supervision Transfers Transfers: Sit to Stand;Stand to [employer] Transfers Sit to Stand: 3: Mod assist Stand to Sit: 3: Mod assist Details for Transfer Assistance: verbal cues for hand placement Ambulation/Gait Ambulation/Gait Assistance: 3: Mod assist Ambulation Distance (Feet): 64 Feet Assistive device: 1 person hand held assist Ambulation/Gait Assistance Details: VCs for technique and safety, assist provided by daughter Gait Pattern: Step-to pattern;Left flexed knee in stance;Shuffle;Trunk flexed;Narrow base of support      PT Goals (current goals can now be found in the care  plan section) Acute Rehab PT Goals Patient Stated Goal: to go home PT Goal Formulation: With patient Time For Goal Achievement: 02/13/13 Potential to Achieve Goals: Fair  Visit Information  Last PT Received On: 03/12/13 Assistance Needed: +1 Reason for Co-Treatment: For patient/therapist safety PT goals addressed during session: Mobility/safety with mobility History of Present Illness: Admitted with sudden onset of L side weakness and LOC.  Pt with hx of CVA and recent L clavicle fx.    Subjective Data  Patient Stated Goal: to go home   Cognition  Cognition Arousal/Alertness: Awake/alert Behavior During Therapy: [facility] for tasks assessed/performed;Anxious Overall Cognitive Status: Within Functional Limits for tasks assessed       End of Session PT - End of Session Equipment Utilized During Treatment: Gait belt Activity Tolerance: Patient tolerated treatment well Patient left: in chair;with call bell/phone within reach;with family/visitor present Nurse Communication: Mobility status   GP     Fabio Asa 03/12/2013, 10:55 AM Charlotte Crumb, PT DPT  [phone]

## 2013-03-12 NOTE — Progress Notes (Signed)
[facility] PROGRESS NOTE  [name] WJX:[id number] DOB: [birth year] DOA: 03/09/2013 PCP: Lemont Fillers., NP  Assessment/Plan: Acute stroke  -According to the MRI done earlier today on [facility] left parietal lobe stroke.  -Patient presented with a left-sided weakness -PT/OT/SLP to evaluate and treat.  -MRI done today shows: Restricted diffusion in the left temporal parietal lobe unchanged  from yesterday and most consistent with acute or subacute infarct.  No new areas of restricted diffusion. -did not repeat 2-D echo, carotid Dopplers as done on 02/06/2013. Doubt is going to be any findings or change in management.  -Neurology following- ? If eliquis or increased dose of xarelto would help  Atrial fibrillation  -Patient is on  Xarelto, per ED notes family is not interested to switch that to Coumadin.  -Continue Xarelto- needs lower dose but still having episodes on meds-  clavical fracture -spoke with Dr. Roda Shutters- patient to follow up as outpatient -not to lift anything heavy but can weight-bear on walker if it is pain free  Syncope  -Patient has history of syncopal episode secondary to orthostatic hypotension, give IV fluids.   Migraine headache  -Patient is on Depakote, continued   Code Status: full Family Communication: patient Disposition Plan:    Consultants:  neuro  Procedures:    Antibiotics:    HPI/Subjective: Eating well Family wishes to take home  Objective: Filed Vitals:   03/12/13 0550  BP: 146/86  Pulse: 69  Temp: 97.7 F (36.5 C)  Resp: 18    Intake/Output Summary (Last 24 hours) at 03/12/13 1025 Last data filed at 03/12/13 0900  Gross per 24 hour  Intake    360 ml  Output      0 ml  Net    360 ml   Filed Weights   03/09/13 1619  Weight: 51.256 kg (113 lb)    Exam:   General:  Pleasant/cooperative  Cardiovascular: rrr  Respiratory: clear anterior  Abdomen: +BS, soft,  NT  Musculoskeletal: moves all 4 ext   Data Reviewed: Basic Metabolic Panel:  Recent Labs Lab 03/12/13 0630  NA 143  K 3.9  CL 109  CO2 24  GLUCOSE 79  BUN 11  CREATININE 0.67  CALCIUM 8.6   Liver Function Tests: No results found for this basename: AST, ALT, ALKPHOS, BILITOT, PROT, ALBUMIN,  in the last 168 hours No results found for this basename: LIPASE, AMYLASE,  in the last 168 hours No results found for this basename: AMMONIA,  in the last 168 hours CBC:  Recent Labs Lab 03/12/13 0630  WBC 4.5  HGB 11.9*  HCT 37.3  MCV 91.9  PLT 200   Cardiac Enzymes: No results found for this basename: CKTOTAL, CKMB, CKMBINDEX, TROPONINI,  in the last 168 hours BNP (last 3 results) No results found for this basename: PROBNP,  in the last 8760 hours CBG: No results found for this basename: GLUCAP,  in the last 168 hours  Recent Results (from the past 240 hour(s))  URINE CULTURE     Status: None   Collection Time    03/09/13 10:50 PM      Result Value Range Status   Specimen Description URINE, CLEAN CATCH   Final   Special Requests NONE   Final   Culture  Setup Time     Final   Value: 03/10/2013 11:40     Performed at [facility] Count     Final   Value: 3,000  COLONIES/ML     Performed at [facility]     Final   Value: INSIGNIFICANT GROWTH     Performed at [facility]   Report Status 03/11/2013 FINAL   Final     Studies: Mr Brain Wo Contrast  03/10/2013   CLINICAL DATA:  Stroke  EXAM: MRI HEAD WITHOUT CONTRAST  TECHNIQUE: Multiplanar, multiecho pulse sequences of the brain and surrounding structures were obtained without intravenous contrast.  COMPARISON:  MRI 03/09/2013  FINDINGS: Restricted diffusion in the left parietal lobe is unchanged. This involves the temporoparietal white matter and some small areas are restricted diffusion in the parietal cortex. No new areas of restricted diffusion are present.  Generalized  atrophy. Chronic microvascular ischemic change throughout the white matter. Chronic lacunar infarction in the deep white matter on the right also unchanged. Chronic ischemic change in the thalami and pons bilaterally. Small chronic infarcts in the right cerebellum.  Negative for hemorrhage or mass lesion. Vessels at the base of the brain are patent.  IMPRESSION: Restricted diffusion in the left temporal parietal lobe unchanged from yesterday and most consistent with acute or subacute infarct. No new areas of restricted diffusion.  Atrophy and moderately severe chronic ischemic changes again noted.   Electronically Signed   By: Marlan Palau M.D.   On: 03/10/2013 11:29    Scheduled Meds: . divalproex  375 mg Oral Q12H  . docusate sodium  100 mg Oral BID  . rivaroxaban  15 mg Oral Q supper   Continuous Infusions:    Principal Problem:   CVA (cerebral vascular accident) Active Problems:   MIGRAINE HEADACHE   Atrial flutter   Orthostatic hypotension   Syncope   HTN (hypertension)   Chronic anticoagulation   Left-sided weakness   Protein-calorie malnutrition, severe    Time spent: 25 min    VANN, JESSICA  [facility] Pager [phone]. If 7PM-7AM, please contact night-coverage at [email or web address], password [facility] 03/12/2013, 10:25 AM  LOS: 3 days

## 2013-03-12 NOTE — Progress Notes (Signed)
Occupational Therapy Treatment Patient Details Name: [name] MRN: [id number] DOB: January 29, 1925 Today's Date: 03/12/2013 Time: 1000-1033 OT Time Calculation (min): 33 min  OT Assessment / Plan / Recommendation  History of present illness Admitted with sudden onset of L side weakness and LOC.  Pt with hx of CVA and recent L clavicle fx.   OT comments  Pt's daughter demonstrated the ability to assist pt with her bathing, dressing, toileting and ambulation.  Plan is to return home.  Pt with less anxiety and improved strength compared to evaluation.  Follow Up Recommendations  Home health OT;Supervision/Assistance - 24 hour    Barriers to Discharge       Equipment Recommendations  None recommended by OT    Recommendations for Other Services    Frequency Min 2X/week   Progress towards OT Goals Progress towards OT goals: Progressing toward goals  Plan Discharge plan needs to be updated    Precautions / Restrictions Precautions Precautions: Fall Precaution Comments: L clavicle fx, pt is not to lift, push, pull with arm Required Braces or Orthoses: Sling Restrictions Weight Bearing Restrictions: Yes   Pertinent Vitals/Pain VSS, no pain reported    ADL  Grooming: Wash/dry hands;Wash/dry face;Denture care;Teeth care;Supervision/safety Where Assessed - Grooming: Unsupported sitting Toilet Transfer: Moderate assistance Toilet Transfer Method: Sit to Barista: Bedside commode Toileting - Clothing Manipulation and Hygiene: Minimal assistance Where Assessed - Toileting Clothing Manipulation and Hygiene: Sit to stand from 3-in-1 or toilet Equipment Used: Gait belt Transfers/Ambulation Related to ADLs: daughter ambulated pt with gait belt in preparation for return home. ADL Comments: Plan is for return home.  Daughter able to manage pt physically and assist her with ADL.    OT Diagnosis:    OT Problem List:   OT Treatment Interventions:     OT Goals(current  goals can now be found in the care plan section) Acute Rehab OT Goals Patient Stated Goal: to go home  Visit Information  Last OT Received On: 03/12/13 Assistance Needed: +1 PT/OT/SLP Co-Evaluation/Treatment: Yes Reason for Co-Treatment: For patient/therapist safety OT goals addressed during session: ADL's and self-care History of Present Illness: Admitted with sudden onset of L side weakness and LOC.  Pt with hx of CVA and recent L clavicle fx.    Subjective Data      Prior Functioning       Cognition  Cognition Arousal/Alertness: Awake/alert Behavior During Therapy: WFL for tasks assessed/performed;Anxious Overall Cognitive Status: Within Functional Limits for tasks assessed    Mobility  Bed Mobility Bed Mobility: Supine to Sit;Sitting - Scoot to Edge of Bed Rolling Right: 4: Min assist Right Sidelying to Sit: 4: Min assist Sitting - Scoot to Edge of Bed: 5: Supervision Transfers Transfers: Sit to Stand;Stand to Sit Sit to Stand: 3: Mod assist Stand to Sit: 3: Mod assist Details for Transfer Assistance: verbal cues for hand placement    Exercises      Balance     End of Session OT - End of Session Activity Tolerance: Patient tolerated treatment well Patient left: in chair;with family/visitor present;with call bell/phone within reach  GO     Evern Bio 03/12/2013, 10:43 AM [phone]

## 2013-03-12 NOTE — Progress Notes (Signed)
CSW received referral for questionable SNF.   Chart reviewed. PT recommended HHPT  Will advise RN Case Manager for assessment of home health and DME needs.   CSW will sign off. Please re-consult is CSW needs arise.    Jaz Mallick LCSWA  [facility]  4N 1-16;  6N1-16 Phone: [phone]     

## 2013-03-12 NOTE — Progress Notes (Signed)
Physician Discharge Summary  [name]:[id number] DOB: 09-Apr-1924 DOA: 03/09/2013  PCP: Lemont Fillers., NP  Admit date: 03/09/2013 Discharge date: 03/12/2013  Time spent: 35 minutes  Recommendations for Outpatient Follow-up:  1. Home health   Discharge Diagnoses:  Principal Problem:   CVA (cerebral vascular accident) Active Problems:   MIGRAINE HEADACHE   Atrial flutter   Orthostatic hypotension   Syncope   HTN (hypertension)   Chronic anticoagulation   Left-sided weakness   Protein-calorie malnutrition, severe   Discharge Condition: improved  Diet recommendation: regular  Filed Weights   03/09/13 1619  Weight: 51.256 kg (113 lb)    History of present illness:  [name] is a 79 y.o. female with past medical history of previous CVA with resultant left-sided weakness, atrial fibrillation and hypertension. Patient came to the hospital because of left-sided weakness. Patient was in the shower her daughter was helping her, and she reported sudden left-sided weakness and loss of consciousness. 911 called and patient tries to [facility]. Patient had similar episode in the past and when patient comes to the hospital complete stroke workup will be done and it will show no acute findings.  In the ED in [facility] regional MRI was done and showed acute/subacute infarct involving the left greater lobe with remote infarct in the right lenticular nucleus. Patient is on Xarelto, the case was discussed with Dr. Pearlean Brownie of [facility] neurology the patient's family wants to be in [place].   Hospital Course:  Acute stroke  -According to the MRI done earlier today on [facility] left parietal lobe stroke.  -Patient presented with a left-sided weakness  -home health -MRI done today shows: Restricted diffusion in the left temporal parietal lobe unchanged  from yesterday and most consistent with acute or subacute infarct.  No new  areas of restricted diffusion.  -did not repeat 2-D echo, carotid Dopplers as done on 02/06/2013. Doubt is going to be any findings or change in management.  -Neurology following- change to eliquis   Atrial fibrillation  -eliquis  clavical fracture  -spoke with Dr. Roda Shutters- patient to follow up as outpatient  -not to lift anything heavy but can weight-bear on walker if it is pain free   Syncope  -Patient has history of syncopal episode secondary to orthostatic hypotension, give IV fluids.   Migraine headache  -Patient is on Depakote, continued      Procedures:    Consultations:  neuro  Discharge Exam: Filed Vitals:   03/12/13 1331  BP: 139/83  Pulse: 62  Temp: 98.1 F (36.7 C)  Resp: 18     Discharge Instructions  Discharge Orders   Future Appointments Provider Department Dept Phone   03/14/2013 2:00 PM Ronal Fear, NP [facility] [phone]   03/16/2013 11:15 AM Sandford Craze, NP [facility] at  [facility] [phone]   03/19/2013 8:15 AM Sandford Craze, NP [facility] at  [facility] [phone]   07/18/2013 2:00 PM Ronal Fear, NP [facility] [phone]   Future Orders Complete By Expires   Diet - low sodium heart healthy  As directed    Discharge instructions  As directed    Comments:     Home health- PT/OT   Increase activity slowly  As directed        Medication List    STOP taking these medications       amLODipine 2.5 MG tablet  Commonly known as:  NORVASC  divalproex 250 MG 24 hr tablet  Commonly known as:  DEPAKOTE ER  Replaced by:  divalproex 125 MG capsule     Rivaroxaban 15 MG Tabs tablet  Commonly known as:  XARELTO      TAKE these medications       apixaban 2.5 MG Tabs tablet  Commonly known as:  ELIQUIS  Take 1 tablet (2.5 mg total) by mouth 2 (two) times daily.     aspirin-acetaminophen-caffeine 250-250-65 MG per tablet  Commonly known as:  EXCEDRIN MIGRAINE   Take 2 tablets by mouth daily as needed for pain (for migraines).     atorvastatin 40 MG tablet  Commonly known as:  LIPITOR  Take 1 tablet (40 mg total) by mouth daily at 6 PM.     CALCI-CHEW PO  Take 1 tablet by mouth 3 (three) times daily.     cholecalciferol 1000 UNITS tablet  Commonly known as:  VITAMIN D  Take 2,000 Units by mouth daily.     divalproex 125 MG capsule  Commonly known as:  DEPAKOTE SPRINKLE  Take 3 capsules (375 mg total) by mouth every 12 (twelve) hours.     docusate sodium 100 MG capsule  Commonly known as:  COLACE  Take 100 mg by mouth daily.     multivitamin with minerals Tabs tablet  Take 1 tablet by mouth daily.     pantoprazole 40 MG tablet  Commonly known as:  PROTONIX  Take 1 tablet (40 mg total) by mouth daily.     sodium chloride 0.65 % Soln nasal spray  Commonly known as:  OCEAN  Place 1 spray into both nostrils as needed for congestion.       Allergies  Allergen Reactions  . Tramadol Hcl Other (See Comments)     lethargy, nausea  . Alendronate Sodium Rash and Other (See Comments)    puffy eyes       Follow-up Information   Follow up with Gates Rigg, MD. Schedule an appointment as soon as possible for a visit in 2 months. (stroke clinic)    Specialties:  Neurology, Radiology   Contact information:   [address] [place] [address] [phone]       Follow up with Lemont Fillers., NP In 1 week.   Specialty:  Internal Medicine   Contact information:   [address] [place] [address] [phone]        The results of significant diagnostics from this hospitalization (including imaging, microbiology, ancillary and laboratory) are listed below for reference.    Significant Diagnostic Studies: Dg Chest 1 View  02/21/2013   CLINICAL DATA:  Status post fall with left shoulder and chest discomfort  EXAM: CHEST - 1 VIEW  COMPARISON:  Chest x-ray dated February 05, 2013.  FINDINGS:  The lungs remain hyperinflated. There is no evidence of a pulmonary contusion or pneumothorax or pneumomediastinum. The cardiac silhouette is normal in size. The pulmonary vascularity is not engorged. The trachea is midline. The observed portions of the bony thorax exhibit no acute abnormalities. Only portions of the left shoulder are included in the field of view. The visualized portions appear normal.  IMPRESSION: 1. No acute abnormality of the visualized portions of the bony thorax is demonstrated. 2. Mild stable hyperinflation is consistent with COPD. There is no evidence of a pulmonary contusion or pneumothorax.   Electronically Signed   By: David  [place]   On: 02/21/2013 12:54   Dg Chest 2 View  03/10/2013   CLINICAL DATA:  CVA.  EXAM: CHEST  2 VIEW  COMPARISON:  Chest radiograph performed earlier today at 9:37 a.m.  FINDINGS: The lungs are well-aerated and clear. There is no evidence of focal opacification, pleural effusion or pneumothorax.  The heart is borderline enlarged. No acute osseous abnormalities are seen. The previously noted distal left clavicular fracture and dislocation of the left sternoclavicular joint are not well characterized on this image.  IMPRESSION: Borderline cardiomegaly; lungs remain grossly clear.   Electronically Signed   By: Roanna Raider M.D.   On: 03/10/2013 00:29   Dg Clavicle Left  02/21/2013   CLINICAL DATA:  Status post fall.  Pain.  EXAM: LEFT CLAVICLE - 2+ VIEWS  COMPARISON:  Plain films left shoulder 02/21/2013.  FINDINGS: The patient has a mildly comminuted fracture of the distal clavicle. The fracture is approximately 3.3 cm medial to the [facility] joint with approximately 1/2 shaft width inferior displacement of the distal fragment. The [facility] joint is intact. Imaged left lung and ribs are clear.  IMPRESSION: Acute distal left clavicle fracture as described.   Electronically Signed   By: Drusilla Kanner M.D.   On: 02/21/2013 15:47   Ct Head Wo Contrast  02/21/2013    CLINICAL DATA:  Fall.  EXAM: CT HEAD WITHOUT CONTRAST  TECHNIQUE: Contiguous axial images were obtained from the base of the skull through the vertex without intravenous contrast.  COMPARISON:  MRI 02/12/2013.  Head CT 02/12/2013.  FINDINGS: No mass. No hydrocephalus. No hemorrhage. Chronic white matter periventricular changes are noted. Old infarct in the right corona radiata noted. Orbits are unremarkable. Paranasal sinuses where visualize are normal. Soft tissue swelling is noted over the left frontoparietal region. No evidence fracture.  IMPRESSION: No significant focal abnormality. Chronic ischemic change present. CT stable from 02/12/2013.   Electronically Signed   By: Maisie Fus  Register   On: 02/21/2013 13:54   Ct Head Wo Contrast  02/12/2013   CLINICAL DATA:  Left-sided weakness  EXAM: CT HEAD WITHOUT CONTRAST  TECHNIQUE: Contiguous axial images were obtained from the base of the skull through the vertex without intravenous contrast.  COMPARISON:  Brain MRI 02/06/2013 and head CT scan 02/05/2013.  FINDINGS: Extensive chronic microvascular ischemic change is again seen with a remote corona radiata infarct on the right identified. No evidence of acute abnormality including infarction, hemorrhage, mass lesion, mass effect, midline shift abnormal extra-axial fluid collection is identified. There is no hydrocephalus or pneumocephalus. The calvarium is intact.  IMPRESSION: No acute finding.  Atrophy, chronic microvascular ischemic change and remote corona radiata infarct, unchanged.  Critical Value/emergent results were called by telephone at the time of interpretation on 02/12/2013 at 7:41 AM to Dr.Stewart , who verbally acknowledged these results.   Electronically Signed   By: Drusilla Kanner M.D.   On: 02/12/2013 07:41   Mr Brain Wo Contrast  03/10/2013   CLINICAL DATA:  Stroke  EXAM: MRI HEAD WITHOUT CONTRAST  TECHNIQUE: Multiplanar, multiecho pulse sequences of the brain and surrounding structures were  obtained without intravenous contrast.  COMPARISON:  MRI 03/09/2013  FINDINGS: Restricted diffusion in the left parietal lobe is unchanged. This involves the temporoparietal white matter and some small areas are restricted diffusion in the parietal cortex. No new areas of restricted diffusion are present.  Generalized atrophy. Chronic microvascular ischemic change throughout the white matter. Chronic lacunar infarction in the deep white matter on the right also unchanged. Chronic ischemic change in the thalami and pons bilaterally. Small  chronic infarcts in the right cerebellum.  Negative for hemorrhage or mass lesion. Vessels at the base of the brain are patent.  IMPRESSION: Restricted diffusion in the left temporal parietal lobe unchanged from yesterday and most consistent with acute or subacute infarct. No new areas of restricted diffusion.  Atrophy and moderately severe chronic ischemic changes again noted.   Electronically Signed   By: Marlan Palau M.D.   On: 03/10/2013 11:29   Mr Brain Wo Contrast  02/12/2013   CLINICAL DATA:  History of atrial fibrillation, hypertension, hyperlipidemia, and previous stroke presenting with new onset left facial droop and left hemiparesis as well as left-sided numbness. Last known well at 6:30 a.m. today. Admitted 1 week ago for leg weakness of acute onset without MR evidence of acute stroke.  EXAM: MRI HEAD WITHOUT CONTRAST  TECHNIQUE: Multiplanar, multiecho pulse sequences of the brain and surrounding structures were obtained without intravenous contrast.  COMPARISON:  CT head 02/12/2013.  Brain MRI 02/06/2013.  FINDINGS: There is no evidence of acute infarct. Encephalomalacia is again seen at the site of old right corona radiata/lentiform nucleus infarct. There is moderate generalized cerebral atrophy. Confluent regions of periventricular T2 hyperintensity and small amount of T2 hyperintensity in the pons do not appear significantly changed and are consistent with  moderate chronic small vessel ischemic disease. Remote lacunar infarcts are again noted in the right cerebellum. There is no evidence of intracranial hemorrhage, mass, midline shift, or extra-axial fluid collection. Major intracranial flow voids are present. Prior bilateral cataract surgery is noted. Paranasal sinuses are clear.  IMPRESSION: 1. No evidence of acute infarct or other acute intracranial abnormality. 2. Unchanged appearance of old right corona radiata infarct and moderate chronic small vessel ischemic disease.   Electronically Signed   By: Sebastian Ache   On: 02/12/2013 10:11   Mr Lumbar Spine Wo Contrast  02/14/2013   CLINICAL DATA:  Left hemiplegia not associated with stroke.  EXAM: MRI LUMBAR SPINE WITHOUT CONTRAST  TECHNIQUE: Multiplanar, multisequence MR imaging was performed. No intravenous contrast was administered.  COMPARISON:  None.  FINDINGS: No marrow signal abnormality suggestive of fracture or neoplasm. Hyperintensity within the inferior endplate of L1 and around the T11-T12 anterior disc is likely degenerative. Normal conus signal and morphology. No left-sided nerve thickening. No extra-spinal findings to explain hemiplegia. There is a tiny (4 mm) T2 hyperintense lesion in the lower left kidney, likely a cyst.  Degenerative changes:  L1-L2: No nerve impingement.  L2-L3: Posterior annular fissure. Disc bulging combined with dorsal ligamentous and facet overgrowth narrows the lateral recesses. No foraminal stenosis.  L3-L4: Mild retrolisthesis. Degenerative facet and ligamentous overgrowth combined with disc bulging and central herniation cause moderate to advanced canal stenosis, with near complete effacement of CSF. The lateral recesses are particularly stenotic. The inferior foramina are effaced.  L4-L5: Facet osteoarthritis with bony and ligamentous overgrowth, causing moderate encroachment on the foramina. The lateral recesses are also narrowed, without nerve compression.   L5-S1:Degenerative disc narrowing.  No nerve compression.  IMPRESSION: 1. No unilateral abnormality to explain left hemiplegia. 2. Moderate to advanced spinal canal stenosis at L3-4, secondary to both disc and facet degeneration. 3. Mild degenerative lateral recess stenosis bilaterally at L2-3 and L4-5.   Electronically Signed   By: Tiburcio Pea M.D.   On: 02/14/2013 04:04   Dg Shoulder Left  02/21/2013   CLINICAL DATA:  Pain status post fall.  EXAM: LEFT SHOULDER - 2+ VIEW  COMPARISON:  None.  FINDINGS: Two views  of the left shoulder are submitted. The [facility] joint is not well evaluated by an acute fracture is suspected. The glenohumeral joint appears normal. The bones exhibit mild osteopenia.  IMPRESSION: The findings are worrisome for an acute fracture through the distal aspect of the clavicular shaft just proximal to the [facility] joint.  These results were called by telephone at the time of interpretation on 02/21/2013 at 12:59 PM to Ms. Albin Felling at [facility].   Electronically Signed   By: David  [place]   On: 02/21/2013 13:00    Microbiology: Recent Results (from the past 240 hour(s))  URINE CULTURE     Status: None   Collection Time    03/09/13 10:50 PM      Result Value Range Status   Specimen Description URINE, CLEAN CATCH   Final   Special Requests NONE   Final   Culture  Setup Time     Final   Value: 03/10/2013 11:40     Performed at [facility] Count     Final   Value: 3,000 COLONIES/ML     Performed at [facility]   Culture     Final   Value: INSIGNIFICANT GROWTH     Performed at [facility]   Report Status 03/11/2013 FINAL   Final     Labs: Basic Metabolic Panel:  Recent Labs Lab 03/12/13 0630  NA 143  K 3.9  CL 109  CO2 24  GLUCOSE 79  BUN 11  CREATININE 0.67  CALCIUM 8.6   Liver Function Tests: No results found for this basename: AST, ALT, ALKPHOS, BILITOT, PROT, ALBUMIN,  in the last 168 hours No results found for this basename:  LIPASE, AMYLASE,  in the last 168 hours No results found for this basename: AMMONIA,  in the last 168 hours CBC:  Recent Labs Lab 03/12/13 0630  WBC 4.5  HGB 11.9*  HCT 37.3  MCV 91.9  PLT 200   Cardiac Enzymes: No results found for this basename: CKTOTAL, CKMB, CKMBINDEX, TROPONINI,  in the last 168 hours BNP: BNP (last 3 results) No results found for this basename: PROBNP,  in the last 8760 hours CBG: No results found for this basename: GLUCAP,  in the last 168 hours     Signed:  Benjamine Mola, Hartlyn Reigel  [facility] 03/12/2013, 1:33 PM

## 2013-03-12 NOTE — Progress Notes (Signed)
ANTICOAGULATION CONSULT NOTE - Initial Consult  Pharmacy Consult for apixaban Indication: atrial fibrillation  Allergies  Allergen Reactions  . Tramadol Hcl Other (See Comments)     lethargy, nausea  . Alendronate Sodium Rash and Other (See Comments)    puffy eyes    Patient Measurements: Height: 5\' 7"  (170.2 cm) Weight: 113 lb (51.256 kg) IBW/kg (Calculated) : 61.6 Heparin Dosing Weight:   Vital Signs: Temp: 97.7 F (36.5 C) (12/15 0550) Temp src: Oral (12/15 0550) BP: 146/86 mmHg (12/15 0550) Pulse Rate: 69 (12/15 0550)  Labs:  Recent Labs  03/12/13 0630  HGB 11.9*  HCT 37.3  PLT 200  CREATININE 0.67    Estimated Creatinine Clearance: 40.9 ml/min (by C-G formula based on Cr of 0.67).   Medical History: Past Medical History  Diagnosis Date  . Arthritis   . Hypertension   . Migraine   . Colon polyp   . Hearing difficulty   . Torus palatinus   . Atrial flutter January, 2012  . Stroke 08/02/12     right lenticular nucleus infarct  . Left leg weakness 02/05/2013  . Chronic anticoagulation   . High cholesterol     Medications:  Scheduled:  . apixaban  2.5 mg Oral BID  . divalproex  375 mg Oral Q12H  . docusate sodium  100 mg Oral BID    Assessment: [name] is an 79 yo F who presented on 12/12 with left hemiparesis who has a history of nonvalvular atrial fibrillation on xarelto PTA. Pharmacy has been consulted today to change her anticoagulation to apixaban.   Due to patient's age > 89 years of age and body weight < 60 kg, dosage will be appropriately adjusted.  No bleeding issues noted. Hemoglobin 11.9. SCr 0.67.  Goal of Therapy:  Monitor platelets by anticoagulation protocol: Yes   Plan:  -Initiate apixaban 2.5mg  PO BID. -Monitor signs of bleeding, renal function, H/H  Edrees Valent C. Kerolos Nehme, PharmD Clinical Pharmacist-Resident Pager: [phone] Pharmacy: [phone] 03/12/2013 1:18 PM

## 2013-03-12 NOTE — Progress Notes (Signed)
Stroke Team Progress Note  HISTORY [name] is an 79 y.o. female with a history of a right subcortical infarct who has presented multiple times recently for left hemiparesis. She collapsed today, 03/09/2013 , and was brought to the ER with similar symptoms, MRI shows LEFT parietal areas of restricted diffusion that are disconinuous.  Her daughter reported that she had total loss of consciousness and did not recover consciousness until she was in the ambulance.  Following her previous admissions for left hemiparesis with negative MRI, she has had recovery to her baseline compromise level of function over the course of days. Patient was not a tPA candidate as she is on xarelto. She was admitted for further evaluation and treatment.   SUBJECTIVE Her daughter, who is a great historian, is at the bedside.  OBJECTIVE Most recent Vital Signs: Filed Vitals:   03/11/13 1852 03/11/13 2157 03/12/13 0142 03/12/13 0550  BP: 131/57 128/72 129/77 146/86  Pulse: 62 63 65 69  Temp: 98.3 F (36.8 C) 98 F (36.7 C) 97.7 F (36.5 C) 97.7 F (36.5 C)  TempSrc: Oral Oral Oral Oral  Resp: 18 16 16 18   Height:      Weight:      SpO2: 100% 96% 98% 98%   CBG (last 3)  No results found for this basename: GLUCAP,  in the last 72 hours  IV Fluid Intake:     MEDICATIONS  . divalproex  375 mg Oral Q12H  . docusate sodium  100 mg Oral BID  . rivaroxaban  15 mg Oral Q supper   PRN:  acetaminophen, bisacodyl, senna-docusate  Diet:  General thin liquids Activity:  Up with assistance DVT Prophylaxis:  Xarelto / SCDs  CLINICALLY SIGNIFICANT STUDIES Basic Metabolic Panel:   Recent Labs Lab 03/12/13 0630  NA 143  K 3.9  CL 109  CO2 24  GLUCOSE 79  BUN 11  CREATININE 0.67  CALCIUM 8.6   Liver Function Tests: No results found for this basename: AST, ALT, ALKPHOS, BILITOT, PROT, ALBUMIN,  in the last 168 hours CBC:   Recent Labs Lab 03/12/13 0630  WBC 4.5  HGB 11.9*  HCT 37.3  MCV 91.9   PLT 200   Coagulation: No results found for this basename: LABPROT, INR,  in the last 168 hours Cardiac Enzymes: No results found for this basename: CKTOTAL, CKMB, CKMBINDEX, TROPONINI,  in the last 168 hours Urinalysis:   Recent Labs Lab 03/09/13 2250  COLORURINE YELLOW  LABSPEC 1.008  PHURINE 7.5  GLUCOSEU NEGATIVE  HGBUR NEGATIVE  BILIRUBINUR NEGATIVE  KETONESUR NEGATIVE  PROTEINUR NEGATIVE  UROBILINOGEN 0.2  NITRITE NEGATIVE  LEUKOCYTESUR SMALL*   Lipid Panel    Component Value Date/Time   CHOL 92 03/10/2013 0420   TRIG 53 03/10/2013 0420   HDL 48 03/10/2013 0420   CHOLHDL 1.9 03/10/2013 0420   VLDL 11 03/10/2013 0420   LDLCALC 33 03/10/2013 0420   HgbA1C  Lab Results  Component Value Date   HGBA1C 5.9* 02/12/2013    Urine Drug Screen:     Component Value Date/Time   LABOPIA NONE DETECTED 02/12/2013 1046   COCAINSCRNUR NONE DETECTED 02/12/2013 1046   LABBENZ NONE DETECTED 02/12/2013 1046   AMPHETMU NONE DETECTED 02/12/2013 1046   THCU NONE DETECTED 02/12/2013 1046   LABBARB NONE DETECTED 02/12/2013 1046    Alcohol Level: No results found for this basename: ETH,  in the last 168 hours  Dg Chest 2 View 03/10/2013    Borderline cardiomegaly;  lungs remain grossly clear.     Mr Brain Wo Contrast 03/10/2013    Restricted diffusion in the left temporal parietal lobe unchanged from yesterday, 03/09/2013, and most consistent with acute or subacute infarct. No new areas of restricted diffusion.  Atrophy and moderately severe chronic ischemic changes again noted.     EEG - This is a normal EEG recording during wakefulness. No evidence of an epileptic disorder was demonstrated.  2D Echocardiogram  02/06/2013 - ejection fraction 55-60%. No source of emboli identified.  Carotid Doppler   02/06/2013 - Findings suggest 1-39% internal carotid artery stenosis bilaterally. Vertebral arteries are patent with antegrade flow.    EKG  atrial flutter  Therapy  Recommendations - skilled nursing facility recommended  Physical Exam    Mental Status:  Patient is awake, alert, oriented to person, place, month, year, and situation.  Immediate and remote memory are intact.  Patient is able to give a clear and coherent history.  No signs of aphasia or neglect  Cranial Nerves:  II: Visual Fields are full. Pupils are equal, round, and reactive to light. Discs are difficult to visualize.  III,IV, VI: EOMI without ptosis or diploplia.  V: Facial sensation is symmetric to temperature  VII: Facial movement is notable for left-sided facial droop  VIII: hearing is intact to voice  X: Uvula elevates symmetrically  XI: Shoulder shrug is symmetric.  XII: tongue is midline without atrophy or fasciculations.  Motor:  Tone is normal. Bulk is normal. 5/5 strength was present throughout the right, she has 4/5 strength in her left arm, though it is limited by recent clavicle fracture on that side. She has 3/5 strength in her left leg  Sensory:  Sensation is intact in the right, on the left she has decreased temperature sensation in the arm and leg  Deep Tendon Reflexes:  2+ and symmetric in the biceps and patellae.  Cerebellar:  FNF intact on right, limited on left do to clavicle fracture  Gait:  Not tested due to leg weakness   ASSESSMENT [name] is a 79 y.o. female presenting with left hemiparesis. TPA was not given as the patient was on Xarelto prior to admission.  An MRI was consistent with a left temporal parietal acute to subacute infarct. Infarct felt to be embolic secondary to atrial flutter.On Xarelto 15mg  daily prior to admission. Now on Xarelto 15mg  daily for secondary stroke prevention. Patient with resultant left hemiparesis. Work up complete.   Atrial fib / flutter - on Xarelto PTA  Hypertension / Orthostatic hypotension per family  Previous right subcortical infarct.  Hyperlipidemia  Possible seizure activity - normal EEG this  admission as above - on Depakote.  Left CVA as above but left hemiparesis. ? Related to previous right subcortical infarct.  Hospital day # 3  TREATMENT/PLAN  Change to  Eliquis for secondary stroke prevention.  Risk factor modification  Discharge pt home with family as prior to admission No further stroke workup indicated. Patient has a 10-15% risk of having another stroke over the next year, the highest risk is within 2 weeks of the most recent stroke/TIA (risk of having a stroke following a stroke or TIA is the same). Ongoing risk factor control by Primary Care Physician Stroke Service will sign off. Please call should any needs arise. Follow up with Dr. Pearlean Brownie, Stroke Clinic, in 2 months.  Annie Main, MSN, RN, ANVP-BC, ANP-BC, [facility] Pager: [phone] 03/12/2013 12:07 PM  I have personally  obtained a history, examined the patient, evaluated imaging results, and formulated the assessment and plan of care. I agree with the above. Antony Contras, MD

## 2013-03-13 NOTE — Discharge Summary (Signed)
Physician Discharge Summary  [name] JXB:[id number] DOB: 18-Apr-1924 DOA: 03/09/2013  PCP: Lemont Fillers., NP  Admit date: 03/09/2013 Discharge date: 03/13/2013  Time spent: 35 minutes  Recommendations for Outpatient Follow-up:  1. Home health   Discharge Diagnoses:  Principal Problem:   CVA (cerebral vascular accident) Active Problems:   MIGRAINE HEADACHE   Atrial flutter   Orthostatic hypotension   Syncope   HTN (hypertension)   Chronic anticoagulation   Left-sided weakness   Protein-calorie malnutrition, severe   Discharge Condition: improved  Diet recommendation: regular  Filed Weights   03/09/13 1619  Weight: 51.256 kg (113 lb)    History of present illness:  [name] is a 79 y.o. female with past medical history of previous CVA with resultant left-sided weakness, atrial fibrillation and hypertension. Patient came to the hospital because of left-sided weakness. Patient was in the shower her daughter was helping her, and she reported sudden left-sided weakness and loss of consciousness. 911 called and patient tries to [facility]. Patient had similar episode in the past and when patient comes to the hospital complete stroke workup will be done and it will show no acute findings.  In the ED in [facility] MRI was done and showed acute/subacute infarct involving the left greater lobe with remote infarct in the right lenticular nucleus. Patient is on Xarelto, the case was discussed with Dr. Pearlean Brownie of [facility] neurology the patient's family wants to be in [place].   Hospital Course:  Acute stroke  -According to the MRI done earlier today on [facility] left parietal lobe stroke.  -Patient presented with a left-sided weakness  -home health -MRI done today shows: Restricted diffusion in the left temporal parietal lobe unchanged  from yesterday and most consistent with acute or subacute infarct.  No new  areas of restricted diffusion.  -did not repeat 2-D echo, carotid Dopplers as done on 02/06/2013. Doubt is going to be any findings or change in management.  -Neurology following- change to eliquis   Atrial fibrillation  -eliquis  clavical fracture  -spoke with Dr. Roda Shutters- patient to follow up as outpatient  -not to lift anything heavy but can weight-bear on walker if it is pain free   Syncope  -Patient has history of syncopal episode secondary to orthostatic hypotension, give IV fluids.   Migraine headache  -Patient is on Depakote, continued      Procedures:    Consultations:  neuro  Discharge Exam: Filed Vitals:   03/12/13 1331  BP: 139/83  Pulse: 62  Temp: 98.1 F (36.7 C)  Resp: 18     Discharge Instructions      Discharge Orders   Future Appointments Provider Department Dept Phone   03/14/2013 2:00 PM Ronal Fear, NP [facility] [phone]   03/16/2013 11:15 AM Sandford Craze, NP [facility] at  [facility] [phone]   03/19/2013 8:15 AM Sandford Craze, NP [facility] at  [facility] [phone]   07/18/2013 2:00 PM Ronal Fear, NP [facility] [phone]   Future Orders Complete By Expires   Diet regular  As directed    Discharge instructions  As directed    Comments:     Home health- PT/OT   Increase activity slowly  As directed        Medication List    STOP taking these medications       amLODipine 2.5 MG tablet  Commonly known as:  NORVASC  divalproex 250 MG 24 hr tablet  Commonly known as:  DEPAKOTE ER  Replaced by:  divalproex 125 MG capsule     Rivaroxaban 15 MG Tabs tablet  Commonly known as:  XARELTO      TAKE these medications       apixaban 2.5 MG Tabs tablet  Commonly known as:  ELIQUIS  Take 1 tablet (2.5 mg total) by mouth 2 (two) times daily.     aspirin-acetaminophen-caffeine 250-250-65 MG per tablet  Commonly known as:  EXCEDRIN MIGRAINE  Take 2 tablets  by mouth daily as needed for pain (for migraines).     atorvastatin 40 MG tablet  Commonly known as:  LIPITOR  Take 1 tablet (40 mg total) by mouth daily at 6 PM.     CALCI-CHEW PO  Take 1 tablet by mouth 3 (three) times daily.     cholecalciferol 1000 UNITS tablet  Commonly known as:  VITAMIN D  Take 2,000 Units by mouth daily.     divalproex 125 MG capsule  Commonly known as:  DEPAKOTE SPRINKLE  Take 3 capsules (375 mg total) by mouth every 12 (twelve) hours.     docusate sodium 100 MG capsule  Commonly known as:  COLACE  Take 100 mg by mouth daily.     multivitamin with minerals Tabs tablet  Take 1 tablet by mouth daily.     pantoprazole 40 MG tablet  Commonly known as:  PROTONIX  Take 1 tablet (40 mg total) by mouth daily.     sodium chloride 0.65 % Soln nasal spray  Commonly known as:  OCEAN  Place 1 spray into both nostrils as needed for congestion.       Allergies  Allergen Reactions  . Tramadol Hcl Other (See Comments)     lethargy, nausea  . Alendronate Sodium Rash and Other (See Comments)    puffy eyes   Follow-up Information   Follow up with Gates Rigg, MD. Schedule an appointment as soon as possible for a visit in 2 months. (stroke clinic)    Specialties:  Neurology, Radiology   Contact information:   [address] [place] [address] [phone]       Follow up with Lemont Fillers., NP In 1 week.   Specialty:  Internal Medicine   Contact information:   [address] [place] [address] [phone]       Follow up with Cheral Almas, MD In 1 week.   Specialty:  Orthopedic Surgery   Contact information:   [address] [place] [address] [phone]        The results of significant diagnostics from this hospitalization (including imaging, microbiology, ancillary and laboratory) are listed below for reference.    Significant Diagnostic Studies: Dg Chest 1 View  02/21/2013    CLINICAL DATA:  Status post fall with left shoulder and chest discomfort  EXAM: CHEST - 1 VIEW  COMPARISON:  Chest x-ray dated February 05, 2013.  FINDINGS: The lungs remain hyperinflated. There is no evidence of a pulmonary contusion or pneumothorax or pneumomediastinum. The cardiac silhouette is normal in size. The pulmonary vascularity is not engorged. The trachea is midline. The observed portions of the bony thorax exhibit no acute abnormalities. Only portions of the left shoulder are included in the field of view. The visualized portions appear normal.  IMPRESSION: 1. No acute abnormality of the visualized portions of the bony thorax is demonstrated. 2. Mild stable hyperinflation is consistent with  COPD. There is no evidence of a pulmonary contusion or pneumothorax.   Electronically Signed   By: David  [place]   On: 02/21/2013 12:54   Dg Chest 2 View  03/10/2013   CLINICAL DATA:  CVA.  EXAM: CHEST  2 VIEW  COMPARISON:  Chest radiograph performed earlier today at 9:37 a.m.  FINDINGS: The lungs are well-aerated and clear. There is no evidence of focal opacification, pleural effusion or pneumothorax.  The heart is borderline enlarged. No acute osseous abnormalities are seen. The previously noted distal left clavicular fracture and dislocation of the left sternoclavicular joint are not well characterized on this image.  IMPRESSION: Borderline cardiomegaly; lungs remain grossly clear.   Electronically Signed   By: Roanna Raider M.D.   On: 03/10/2013 00:29   Dg Clavicle Left  02/21/2013   CLINICAL DATA:  Status post fall.  Pain.  EXAM: LEFT CLAVICLE - 2+ VIEWS  COMPARISON:  Plain films left shoulder 02/21/2013.  FINDINGS: The patient has a mildly comminuted fracture of the distal clavicle. The fracture is approximately 3.3 cm medial to the [facility] joint with approximately 1/2 shaft width inferior displacement of the distal fragment. The [facility] joint is intact. Imaged left lung and ribs are clear.  IMPRESSION: Acute  distal left clavicle fracture as described.   Electronically Signed   By: Drusilla Kanner M.D.   On: 02/21/2013 15:47   Ct Head Wo Contrast  02/21/2013   CLINICAL DATA:  Fall.  EXAM: CT HEAD WITHOUT CONTRAST  TECHNIQUE: Contiguous axial images were obtained from the base of the skull through the vertex without intravenous contrast.  COMPARISON:  MRI 02/12/2013.  Head CT 02/12/2013.  FINDINGS: No mass. No hydrocephalus. No hemorrhage. Chronic white matter periventricular changes are noted. Old infarct in the right corona radiata noted. Orbits are unremarkable. Paranasal sinuses where visualize are normal. Soft tissue swelling is noted over the left frontoparietal region. No evidence fracture.  IMPRESSION: No significant focal abnormality. Chronic ischemic change present. CT stable from 02/12/2013.   Electronically Signed   By: Maisie Fus  Register   On: 02/21/2013 13:54   Ct Head Wo Contrast  02/12/2013   CLINICAL DATA:  Left-sided weakness  EXAM: CT HEAD WITHOUT CONTRAST  TECHNIQUE: Contiguous axial images were obtained from the base of the skull through the vertex without intravenous contrast.  COMPARISON:  Brain MRI 02/06/2013 and head CT scan 02/05/2013.  FINDINGS: Extensive chronic microvascular ischemic change is again seen with a remote corona radiata infarct on the right identified. No evidence of acute abnormality including infarction, hemorrhage, mass lesion, mass effect, midline shift abnormal extra-axial fluid collection is identified. There is no hydrocephalus or pneumocephalus. The calvarium is intact.  IMPRESSION: No acute finding.  Atrophy, chronic microvascular ischemic change and remote corona radiata infarct, unchanged.  Critical Value/emergent results were called by telephone at the time of interpretation on 02/12/2013 at 7:41 AM to Dr.Stewart , who verbally acknowledged these results.   Electronically Signed   By: Drusilla Kanner M.D.   On: 02/12/2013 07:41   Mr Brain Wo  Contrast  03/10/2013   CLINICAL DATA:  Stroke  EXAM: MRI HEAD WITHOUT CONTRAST  TECHNIQUE: Multiplanar, multiecho pulse sequences of the brain and surrounding structures were obtained without intravenous contrast.  COMPARISON:  MRI 03/09/2013  FINDINGS: Restricted diffusion in the left parietal lobe is unchanged. This involves the temporoparietal white matter and some small areas are restricted diffusion in the parietal cortex. No new areas of restricted diffusion are present.  Generalized atrophy. Chronic microvascular ischemic change throughout the white matter. Chronic lacunar infarction in the deep white matter on the right also unchanged. Chronic ischemic change in the thalami and pons bilaterally. Small chronic infarcts in the right cerebellum.  Negative for hemorrhage or mass lesion. Vessels at the base of the brain are patent.  IMPRESSION: Restricted diffusion in the left temporal parietal lobe unchanged from yesterday and most consistent with acute or subacute infarct. No new areas of restricted diffusion.  Atrophy and moderately severe chronic ischemic changes again noted.   Electronically Signed   By: Marlan Palau M.D.   On: 03/10/2013 11:29   Mr Brain Wo Contrast  02/12/2013   CLINICAL DATA:  History of atrial fibrillation, hypertension, hyperlipidemia, and previous stroke presenting with new onset left facial droop and left hemiparesis as well as left-sided numbness. Last known well at 6:30 a.m. today. Admitted 1 week ago for leg weakness of acute onset without MR evidence of acute stroke.  EXAM: MRI HEAD WITHOUT CONTRAST  TECHNIQUE: Multiplanar, multiecho pulse sequences of the brain and surrounding structures were obtained without intravenous contrast.  COMPARISON:  CT head 02/12/2013.  Brain MRI 02/06/2013.  FINDINGS: There is no evidence of acute infarct. Encephalomalacia is again seen at the site of old right corona radiata/lentiform nucleus infarct. There is moderate generalized cerebral  atrophy. Confluent regions of periventricular T2 hyperintensity and small amount of T2 hyperintensity in the pons do not appear significantly changed and are consistent with moderate chronic small vessel ischemic disease. Remote lacunar infarcts are again noted in the right cerebellum. There is no evidence of intracranial hemorrhage, mass, midline shift, or extra-axial fluid collection. Major intracranial flow voids are present. Prior bilateral cataract surgery is noted. Paranasal sinuses are clear.  IMPRESSION: 1. No evidence of acute infarct or other acute intracranial abnormality. 2. Unchanged appearance of old right corona radiata infarct and moderate chronic small vessel ischemic disease.   Electronically Signed   By: Sebastian Ache   On: 02/12/2013 10:11   Mr Lumbar Spine Wo Contrast  02/14/2013   CLINICAL DATA:  Left hemiplegia not associated with stroke.  EXAM: MRI LUMBAR SPINE WITHOUT CONTRAST  TECHNIQUE: Multiplanar, multisequence MR imaging was performed. No intravenous contrast was administered.  COMPARISON:  None.  FINDINGS: No marrow signal abnormality suggestive of fracture or neoplasm. Hyperintensity within the inferior endplate of L1 and around the T11-T12 anterior disc is likely degenerative. Normal conus signal and morphology. No left-sided nerve thickening. No extra-spinal findings to explain hemiplegia. There is a tiny (4 mm) T2 hyperintense lesion in the lower left kidney, likely a cyst.  Degenerative changes:  L1-L2: No nerve impingement.  L2-L3: Posterior annular fissure. Disc bulging combined with dorsal ligamentous and facet overgrowth narrows the lateral recesses. No foraminal stenosis.  L3-L4: Mild retrolisthesis. Degenerative facet and ligamentous overgrowth combined with disc bulging and central herniation cause moderate to advanced canal stenosis, with near complete effacement of CSF. The lateral recesses are particularly stenotic. The inferior foramina are effaced.  L4-L5: Facet  osteoarthritis with bony and ligamentous overgrowth, causing moderate encroachment on the foramina. The lateral recesses are also narrowed, without nerve compression.  L5-S1:Degenerative disc narrowing.  No nerve compression.  IMPRESSION: 1. No unilateral abnormality to explain left hemiplegia. 2. Moderate to advanced spinal canal stenosis at L3-4, secondary to both disc and facet degeneration. 3. Mild degenerative lateral recess stenosis bilaterally at L2-3 and L4-5.   Electronically Signed   By: Tiburcio Pea M.D.   On:  02/14/2013 04:04   Dg Shoulder Left  02/21/2013   CLINICAL DATA:  Pain status post fall.  EXAM: LEFT SHOULDER - 2+ VIEW  COMPARISON:  None.  FINDINGS: Two views of the left shoulder are submitted. The [facility] joint is not well evaluated by an acute fracture is suspected. The glenohumeral joint appears normal. The bones exhibit mild osteopenia.  IMPRESSION: The findings are worrisome for an acute fracture through the distal aspect of the clavicular shaft just proximal to the [facility] joint.  These results were called by telephone at the time of interpretation on 02/21/2013 at 12:59 PM to Ms. Albin Felling at [facility].   Electronically Signed   By: David  [place]   On: 02/21/2013 13:00    Microbiology: Recent Results (from the past 240 hour(s))  URINE CULTURE     Status: None   Collection Time    03/09/13 10:50 PM      Result Value Range Status   Specimen Description URINE, CLEAN CATCH   Final   Special Requests NONE   Final   Culture  Setup Time     Final   Value: 03/10/2013 11:40     Performed at [facility] Count     Final   Value: 3,000 COLONIES/ML     Performed at [facility]   Culture     Final   Value: INSIGNIFICANT GROWTH     Performed at [facility]   Report Status 03/11/2013 FINAL   Final     Labs: Basic Metabolic Panel:  Recent Labs Lab 03/12/13 0630  NA 143  K 3.9  CL 109  CO2 24  GLUCOSE 79  BUN 11  CREATININE 0.67  CALCIUM 8.6    Liver Function Tests: No results found for this basename: AST, ALT, ALKPHOS, BILITOT, PROT, ALBUMIN,  in the last 168 hours No results found for this basename: LIPASE, AMYLASE,  in the last 168 hours No results found for this basename: AMMONIA,  in the last 168 hours CBC:  Recent Labs Lab 03/12/13 0630  WBC 4.5  HGB 11.9*  HCT 37.3  MCV 91.9  PLT 200   Cardiac Enzymes: No results found for this basename: CKTOTAL, CKMB, CKMBINDEX, TROPONINI,  in the last 168 hours BNP: BNP (last 3 results) No results found for this basename: PROBNP,  in the last 8760 hours CBG: No results found for this basename: GLUCAP,  in the last 168 hours     Signed:  Benjamine Mola, Kenyatte Chatmon  [facility] 03/13/2013, 9:46 AM

## 2013-03-14 ENCOUNTER — Ambulatory Visit: Payer: Self-pay | Admitting: Nurse Practitioner

## 2013-03-14 DIAGNOSIS — I4891 Unspecified atrial fibrillation: Secondary | ICD-10-CM | POA: Diagnosis not present

## 2013-03-14 DIAGNOSIS — K219 Gastro-esophageal reflux disease without esophagitis: Secondary | ICD-10-CM | POA: Diagnosis not present

## 2013-03-14 DIAGNOSIS — I10 Essential (primary) hypertension: Secondary | ICD-10-CM | POA: Diagnosis not present

## 2013-03-14 DIAGNOSIS — IMO0001 Reserved for inherently not codable concepts without codable children: Secondary | ICD-10-CM | POA: Diagnosis not present

## 2013-03-14 DIAGNOSIS — I69959 Hemiplegia and hemiparesis following unspecified cerebrovascular disease affecting unspecified side: Secondary | ICD-10-CM | POA: Diagnosis not present

## 2013-03-14 DIAGNOSIS — M6281 Muscle weakness (generalized): Secondary | ICD-10-CM | POA: Diagnosis not present

## 2013-03-15 ENCOUNTER — Telehealth: Payer: Self-pay | Admitting: Neurology

## 2013-03-15 DIAGNOSIS — I4891 Unspecified atrial fibrillation: Secondary | ICD-10-CM | POA: Diagnosis not present

## 2013-03-15 DIAGNOSIS — IMO0001 Reserved for inherently not codable concepts without codable children: Secondary | ICD-10-CM | POA: Diagnosis not present

## 2013-03-15 DIAGNOSIS — I10 Essential (primary) hypertension: Secondary | ICD-10-CM | POA: Diagnosis not present

## 2013-03-15 DIAGNOSIS — I69959 Hemiplegia and hemiparesis following unspecified cerebrovascular disease affecting unspecified side: Secondary | ICD-10-CM | POA: Diagnosis not present

## 2013-03-15 DIAGNOSIS — M6281 Muscle weakness (generalized): Secondary | ICD-10-CM | POA: Diagnosis not present

## 2013-03-15 DIAGNOSIS — K219 Gastro-esophageal reflux disease without esophagitis: Secondary | ICD-10-CM | POA: Diagnosis not present

## 2013-03-16 ENCOUNTER — Encounter: Payer: Self-pay | Admitting: Family

## 2013-03-16 ENCOUNTER — Ambulatory Visit (INDEPENDENT_AMBULATORY_CARE_PROVIDER_SITE_OTHER): Payer: Medicare Other | Admitting: Family

## 2013-03-16 VITALS — BP 138/86 | HR 83 | Temp 97.7°F | Resp 16 | Ht 67.0 in

## 2013-03-16 DIAGNOSIS — S42002A Fracture of unspecified part of left clavicle, initial encounter for closed fracture: Secondary | ICD-10-CM

## 2013-03-16 DIAGNOSIS — M6281 Muscle weakness (generalized): Secondary | ICD-10-CM | POA: Diagnosis not present

## 2013-03-16 DIAGNOSIS — S42009A Fracture of unspecified part of unspecified clavicle, initial encounter for closed fracture: Secondary | ICD-10-CM | POA: Diagnosis not present

## 2013-03-16 DIAGNOSIS — I10 Essential (primary) hypertension: Secondary | ICD-10-CM | POA: Diagnosis not present

## 2013-03-16 DIAGNOSIS — K5900 Constipation, unspecified: Secondary | ICD-10-CM | POA: Insufficient documentation

## 2013-03-16 DIAGNOSIS — K219 Gastro-esophageal reflux disease without esophagitis: Secondary | ICD-10-CM | POA: Diagnosis not present

## 2013-03-16 DIAGNOSIS — IMO0001 Reserved for inherently not codable concepts without codable children: Secondary | ICD-10-CM | POA: Diagnosis not present

## 2013-03-16 DIAGNOSIS — I6350 Cerebral infarction due to unspecified occlusion or stenosis of unspecified cerebral artery: Secondary | ICD-10-CM

## 2013-03-16 DIAGNOSIS — I639 Cerebral infarction, unspecified: Secondary | ICD-10-CM

## 2013-03-16 DIAGNOSIS — I69959 Hemiplegia and hemiparesis following unspecified cerebrovascular disease affecting unspecified side: Secondary | ICD-10-CM | POA: Diagnosis not present

## 2013-03-16 DIAGNOSIS — I4891 Unspecified atrial fibrillation: Secondary | ICD-10-CM | POA: Diagnosis not present

## 2013-03-16 NOTE — Assessment & Plan Note (Signed)
Family wishes to discuss change of xarelto to Eliquis with Dr. Pearlean Brownie and plans to contact his office.  Continue PT.

## 2013-03-16 NOTE — Progress Notes (Signed)
Subjective:    Patient ID: [name], female    DOB: 04/21/1924, 79 y.o.   MRN: [id number]  HPI  Mr. Mustin is an 79 yr old female who presents today for hospital follow up. She comes today with two daughters. She was admitted 12/12 through 12/16.  She presented to the hospital with left sided weakness which began when she was being assisted in the shower by her daughter.  She initially presented to [facility] where an MRI was performed.  Per ED notes MRI noted acute/subacut infarct involving the left greater low with remote infarct in the right lenticular nucleus. Patient was on xarelto at the time of the stroke. [facility] was admitted [facility] and [facility] was consulted. Neuro recommended that plavix be changed to eliquis.  2D echo and carotid dopplers were not performed as these were done on 02/06/13 and it was not felt that any finding would change management.   Reports clavicle fracture was the day before thanksgiving. Reports that this occurred while she was in rehab as a result of a fall.  Pt to follow up with Dr. Roda Shutters for ortho.  She has [facility] coming ot the house. Has home health PT/  OT is supposed to start at home as well.  They have a raised toilet set.  She is ambulating with a cane.  Does not have room for a walker at home.  She continues to have left arm and left leg weakness.  She has a free month of eliquis. But daughter wants to make sure that   Constipation- using colace bid, and prn glycerine suppositories.      Review of Systems See HPI  Past Medical History  Diagnosis Date  . Arthritis   . Hypertension   . Migraine   . Colon polyp   . Hearing difficulty   . Torus palatinus   . Atrial flutter January, 2012  . Stroke 08/02/12     right lenticular nucleus infarct  . Left leg weakness 02/05/2013  . Chronic anticoagulation   . High cholesterol     History   Social History  . Marital Status: Widowed    Spouse Name: N/A    Number of Children: 3  . Years of  Education: college   Occupational History  . 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    Social History Main Topics  . Smoking status: Former Games developer  . Smokeless tobacco: Never Used  . Alcohol Use: 0.0 oz/week     Comment: 08/01/2012 "glass of wine on special occasions"  . Drug Use: No  . Sexual Activity: No   Other Topics Concern  . Not on file   Social History Narrative  . No narrative on file    Past Surgical History  Procedure Laterality Date  . Cataract extraction w/ intraocular lens  implant, bilateral  2006-2008  . Ganglion cyst excision Bilateral 1938,1954,2003,2005    "wrists/hand" (08/01/2012)  . Cardioversion  05/19/2010    Dr. Jacinto Halim  . Tonsillectomy  ~ 1935  . Appendectomy  02/19/53    `    Family History  Problem Relation Age of Onset  . Colon cancer    . Breast cancer    . Brain cancer    . Cancer Mother     breast  . Aneurysm Mother     brain  . Heart attack Neg Hx   . Diabetes Neg Hx   . Hypertension Neg Hx     Allergies  Allergen  Reactions  . Tramadol Hcl Other (See Comments)     lethargy, nausea  . Alendronate Sodium Rash and Other (See Comments)    puffy eyes    Current Outpatient Prescriptions on File Prior to Visit  Medication Sig Dispense Refill  . aspirin-acetaminophen-caffeine (EXCEDRIN MIGRAINE) 250-250-65 MG per tablet Take 2 tablets by mouth daily as needed for pain (for migraines).      Marland Kitchen atorvastatin (LIPITOR) 40 MG tablet Take 1 tablet (40 mg total) by mouth daily at 6 PM.  30 tablet  0  . Calcium Carbonate (CALCI-CHEW PO) Take 1 tablet by mouth 3 (three) times daily.      . cholecalciferol (VITAMIN D) 1000 UNITS tablet Take 2,000 Units by mouth daily.      Marland Kitchen docusate sodium (COLACE) 100 MG capsule Take 100 mg by mouth 2 (two) times daily.       . Multiple Vitamin (MULTIVITAMIN WITH MINERALS) TABS Take 1 tablet by mouth daily.      Marland Kitchen apixaban (ELIQUIS) 2.5 MG TABS tablet Take 1 tablet (2.5 mg total) by mouth 2 (two) times daily.  60  tablet  0  . divalproex (DEPAKOTE SPRINKLE) 125 MG capsule Take 3 capsules (375 mg total) by mouth every 12 (twelve) hours.  180 capsule  1  . pantoprazole (PROTONIX) 40 MG tablet Take 1 tablet (40 mg total) by mouth daily.  30 tablet  1  . sodium chloride (OCEAN) 0.65 % SOLN nasal spray Place 1 spray into both nostrils as needed for congestion.  1 Bottle  0   No current facility-administered medications on file prior to visit.    BP 138/86  Pulse 83  Temp(Src) 97.7 F (36.5 C) (Oral)  Resp 16  Ht 5\' 7"  (1.702 m)  SpO2 99%       Objective:   Physical Exam  Constitutional: She is oriented to person, place, and time. She appears well-developed and well-nourished. No distress.  HENT:  Head: Normocephalic and atraumatic.  Cardiovascular: Normal rate and regular rhythm.   No murmur heard. Pulmonary/Chest: Effort normal and breath sounds normal. No respiratory distress. She has no wheezes. She has no rales. She exhibits no tenderness.  Neurological: She is alert and oriented to person, place, and time.  Speech is clear.   RUE/RLE strength 5/5 Unable to assess LUE strength due to clavicular fracture. LLE strength is 4/5  Psychiatric: She has a normal mood and affect. Her behavior is normal. Judgment and thought content normal.          Assessment & Plan:

## 2013-03-16 NOTE — Telephone Encounter (Signed)
Both of these meds were prescribed buy Joseph Art, DO according to the chart.  I called the pharmacy and spoke with [facility].  She said both meds did go through without any problems.

## 2013-03-16 NOTE — Progress Notes (Signed)
Pre visit review using our clinic review tool, if applicable. No additional management support is needed unless otherwise documented below in the visit note. 

## 2013-03-16 NOTE — Patient Instructions (Signed)
Please follow up in 6-8 weeks. Check with your insurance re: coverage for prevnar vaccine and zostavax vaccine.

## 2013-03-16 NOTE — Assessment & Plan Note (Signed)
Recommended trial of miralax. Start with one cap daily then titrate dose down as needed to obtain soft BM.

## 2013-03-16 NOTE — Assessment & Plan Note (Signed)
Defer management to orthopedics.  

## 2013-03-16 NOTE — Telephone Encounter (Signed)
I called and advised daughter the pharmacy said Rx's were ready.  She will follow up with them.

## 2013-03-17 DIAGNOSIS — I4891 Unspecified atrial fibrillation: Secondary | ICD-10-CM | POA: Diagnosis not present

## 2013-03-17 DIAGNOSIS — I10 Essential (primary) hypertension: Secondary | ICD-10-CM | POA: Diagnosis not present

## 2013-03-17 DIAGNOSIS — M6281 Muscle weakness (generalized): Secondary | ICD-10-CM | POA: Diagnosis not present

## 2013-03-17 DIAGNOSIS — I69959 Hemiplegia and hemiparesis following unspecified cerebrovascular disease affecting unspecified side: Secondary | ICD-10-CM | POA: Diagnosis not present

## 2013-03-17 DIAGNOSIS — IMO0001 Reserved for inherently not codable concepts without codable children: Secondary | ICD-10-CM | POA: Diagnosis not present

## 2013-03-17 DIAGNOSIS — K219 Gastro-esophageal reflux disease without esophagitis: Secondary | ICD-10-CM | POA: Diagnosis not present

## 2013-03-19 ENCOUNTER — Ambulatory Visit: Payer: Medicare Other | Admitting: Family

## 2013-03-19 ENCOUNTER — Encounter: Payer: Self-pay | Admitting: Family

## 2013-03-19 DIAGNOSIS — K219 Gastro-esophageal reflux disease without esophagitis: Secondary | ICD-10-CM | POA: Diagnosis not present

## 2013-03-19 DIAGNOSIS — M6281 Muscle weakness (generalized): Secondary | ICD-10-CM | POA: Diagnosis not present

## 2013-03-19 DIAGNOSIS — I4891 Unspecified atrial fibrillation: Secondary | ICD-10-CM | POA: Diagnosis not present

## 2013-03-19 DIAGNOSIS — I10 Essential (primary) hypertension: Secondary | ICD-10-CM | POA: Diagnosis not present

## 2013-03-19 DIAGNOSIS — IMO0001 Reserved for inherently not codable concepts without codable children: Secondary | ICD-10-CM | POA: Diagnosis not present

## 2013-03-19 DIAGNOSIS — I69959 Hemiplegia and hemiparesis following unspecified cerebrovascular disease affecting unspecified side: Secondary | ICD-10-CM | POA: Diagnosis not present

## 2013-03-20 DIAGNOSIS — I4891 Unspecified atrial fibrillation: Secondary | ICD-10-CM | POA: Diagnosis not present

## 2013-03-20 DIAGNOSIS — I10 Essential (primary) hypertension: Secondary | ICD-10-CM | POA: Diagnosis not present

## 2013-03-20 DIAGNOSIS — I69959 Hemiplegia and hemiparesis following unspecified cerebrovascular disease affecting unspecified side: Secondary | ICD-10-CM | POA: Diagnosis not present

## 2013-03-20 DIAGNOSIS — K219 Gastro-esophageal reflux disease without esophagitis: Secondary | ICD-10-CM | POA: Diagnosis not present

## 2013-03-20 DIAGNOSIS — IMO0001 Reserved for inherently not codable concepts without codable children: Secondary | ICD-10-CM | POA: Diagnosis not present

## 2013-03-20 DIAGNOSIS — M6281 Muscle weakness (generalized): Secondary | ICD-10-CM | POA: Diagnosis not present

## 2013-03-20 NOTE — Telephone Encounter (Signed)
Left message requesting that pt return our call.  When she calls back please let her know that we do not have samples on Eliquis.  She may need change to a different medication.  I will forward to Dr. Pearlean Brownie for further recommendations.

## 2013-03-20 NOTE — Telephone Encounter (Signed)
Notified pt's daughter.  She states that Xarelto and regular Depakote (not sprinkles) were more cost effective for pt. States pt was placed on depakote sprinkles and eliquis when she was recently discharged from the hospital. States depakote sprinkles were approved by insurance but cost pt $110 per month. She states they has not heard anything about prior authorization of the Eliquis. Notes that pt is close to running out of her medications and they would like to know something before Christmas. Advised pt was are awaiting recommendations from Dr Pearlean Brownie and will let her know something by tomorrow.

## 2013-03-21 MED ORDER — RIVAROXABAN 15 MG PO TABS: 15.0000 mg | ORAL_TABLET | Freq: Every day | ORAL | Status: DC

## 2013-03-21 MED ORDER — DIVALPROEX SODIUM 125 MG PO DR TAB: 375.0000 mg | DELAYED_RELEASE_TABLET | Freq: Two times a day (BID) | ORAL | Status: DC

## 2013-03-21 NOTE — Telephone Encounter (Signed)
Notified Kim and she voices understanding.

## 2013-03-21 NOTE — Telephone Encounter (Signed)
Please call daughter and let her know that I have not heard back from Dr Pearlean Brownie yet.  In the meantime, I will send rx for Depakote tabs and xarelto in place of eliquis until we get further instruction from neurology.

## 2013-03-23 DIAGNOSIS — I69959 Hemiplegia and hemiparesis following unspecified cerebrovascular disease affecting unspecified side: Secondary | ICD-10-CM | POA: Diagnosis not present

## 2013-03-23 DIAGNOSIS — K219 Gastro-esophageal reflux disease without esophagitis: Secondary | ICD-10-CM | POA: Diagnosis not present

## 2013-03-23 DIAGNOSIS — I4891 Unspecified atrial fibrillation: Secondary | ICD-10-CM | POA: Diagnosis not present

## 2013-03-23 DIAGNOSIS — M6281 Muscle weakness (generalized): Secondary | ICD-10-CM | POA: Diagnosis not present

## 2013-03-23 DIAGNOSIS — I10 Essential (primary) hypertension: Secondary | ICD-10-CM | POA: Diagnosis not present

## 2013-03-23 DIAGNOSIS — IMO0001 Reserved for inherently not codable concepts without codable children: Secondary | ICD-10-CM | POA: Diagnosis not present

## 2013-03-26 DIAGNOSIS — I10 Essential (primary) hypertension: Secondary | ICD-10-CM | POA: Diagnosis not present

## 2013-03-26 DIAGNOSIS — IMO0001 Reserved for inherently not codable concepts without codable children: Secondary | ICD-10-CM | POA: Diagnosis not present

## 2013-03-26 DIAGNOSIS — M6281 Muscle weakness (generalized): Secondary | ICD-10-CM | POA: Diagnosis not present

## 2013-03-26 DIAGNOSIS — I69959 Hemiplegia and hemiparesis following unspecified cerebrovascular disease affecting unspecified side: Secondary | ICD-10-CM | POA: Diagnosis not present

## 2013-03-26 DIAGNOSIS — I4891 Unspecified atrial fibrillation: Secondary | ICD-10-CM | POA: Diagnosis not present

## 2013-03-26 DIAGNOSIS — K219 Gastro-esophageal reflux disease without esophagitis: Secondary | ICD-10-CM | POA: Diagnosis not present

## 2013-03-27 DIAGNOSIS — I69959 Hemiplegia and hemiparesis following unspecified cerebrovascular disease affecting unspecified side: Secondary | ICD-10-CM | POA: Diagnosis not present

## 2013-03-27 DIAGNOSIS — M6281 Muscle weakness (generalized): Secondary | ICD-10-CM | POA: Diagnosis not present

## 2013-03-27 DIAGNOSIS — IMO0001 Reserved for inherently not codable concepts without codable children: Secondary | ICD-10-CM | POA: Diagnosis not present

## 2013-03-27 DIAGNOSIS — K219 Gastro-esophageal reflux disease without esophagitis: Secondary | ICD-10-CM | POA: Diagnosis not present

## 2013-03-27 DIAGNOSIS — I4891 Unspecified atrial fibrillation: Secondary | ICD-10-CM | POA: Diagnosis not present

## 2013-03-27 DIAGNOSIS — I10 Essential (primary) hypertension: Secondary | ICD-10-CM | POA: Diagnosis not present

## 2013-03-30 DIAGNOSIS — M6281 Muscle weakness (generalized): Secondary | ICD-10-CM | POA: Diagnosis not present

## 2013-03-30 DIAGNOSIS — I4891 Unspecified atrial fibrillation: Secondary | ICD-10-CM | POA: Diagnosis not present

## 2013-03-30 DIAGNOSIS — I10 Essential (primary) hypertension: Secondary | ICD-10-CM | POA: Diagnosis not present

## 2013-03-30 DIAGNOSIS — I69959 Hemiplegia and hemiparesis following unspecified cerebrovascular disease affecting unspecified side: Secondary | ICD-10-CM | POA: Diagnosis not present

## 2013-03-30 DIAGNOSIS — IMO0001 Reserved for inherently not codable concepts without codable children: Secondary | ICD-10-CM | POA: Diagnosis not present

## 2013-03-30 DIAGNOSIS — K219 Gastro-esophageal reflux disease without esophagitis: Secondary | ICD-10-CM | POA: Diagnosis not present

## 2013-04-02 DIAGNOSIS — I4891 Unspecified atrial fibrillation: Secondary | ICD-10-CM | POA: Diagnosis not present

## 2013-04-02 DIAGNOSIS — I69959 Hemiplegia and hemiparesis following unspecified cerebrovascular disease affecting unspecified side: Secondary | ICD-10-CM | POA: Diagnosis not present

## 2013-04-02 DIAGNOSIS — IMO0001 Reserved for inherently not codable concepts without codable children: Secondary | ICD-10-CM | POA: Diagnosis not present

## 2013-04-02 DIAGNOSIS — K219 Gastro-esophageal reflux disease without esophagitis: Secondary | ICD-10-CM | POA: Diagnosis not present

## 2013-04-02 DIAGNOSIS — M6281 Muscle weakness (generalized): Secondary | ICD-10-CM | POA: Diagnosis not present

## 2013-04-02 DIAGNOSIS — I10 Essential (primary) hypertension: Secondary | ICD-10-CM | POA: Diagnosis not present

## 2013-04-03 ENCOUNTER — Telehealth: Payer: Self-pay

## 2013-04-03 DIAGNOSIS — M6281 Muscle weakness (generalized): Secondary | ICD-10-CM | POA: Diagnosis not present

## 2013-04-03 DIAGNOSIS — I69959 Hemiplegia and hemiparesis following unspecified cerebrovascular disease affecting unspecified side: Secondary | ICD-10-CM | POA: Diagnosis not present

## 2013-04-03 DIAGNOSIS — I10 Essential (primary) hypertension: Secondary | ICD-10-CM | POA: Diagnosis not present

## 2013-04-03 DIAGNOSIS — I4891 Unspecified atrial fibrillation: Secondary | ICD-10-CM | POA: Diagnosis not present

## 2013-04-03 DIAGNOSIS — K219 Gastro-esophageal reflux disease without esophagitis: Secondary | ICD-10-CM | POA: Diagnosis not present

## 2013-04-03 DIAGNOSIS — IMO0001 Reserved for inherently not codable concepts without codable children: Secondary | ICD-10-CM | POA: Diagnosis not present

## 2013-04-03 NOTE — Telephone Encounter (Signed)
Amy @ [facility] is requesting a verbal order for OT for 2 times a week for 5 weeks. Is this okay?

## 2013-04-03 NOTE — Telephone Encounter (Signed)
OK 

## 2013-04-03 NOTE — Telephone Encounter (Signed)
Amy @ [facility] is requesting a verbal order for OT for 2 times a week for 5 weeks. Is this okay?  

## 2013-04-04 DIAGNOSIS — I69959 Hemiplegia and hemiparesis following unspecified cerebrovascular disease affecting unspecified side: Secondary | ICD-10-CM | POA: Diagnosis not present

## 2013-04-04 DIAGNOSIS — I10 Essential (primary) hypertension: Secondary | ICD-10-CM | POA: Diagnosis not present

## 2013-04-04 DIAGNOSIS — IMO0001 Reserved for inherently not codable concepts without codable children: Secondary | ICD-10-CM | POA: Diagnosis not present

## 2013-04-04 DIAGNOSIS — M6281 Muscle weakness (generalized): Secondary | ICD-10-CM | POA: Diagnosis not present

## 2013-04-04 DIAGNOSIS — K219 Gastro-esophageal reflux disease without esophagitis: Secondary | ICD-10-CM | POA: Diagnosis not present

## 2013-04-04 DIAGNOSIS — I4891 Unspecified atrial fibrillation: Secondary | ICD-10-CM | POA: Diagnosis not present

## 2013-04-04 NOTE — Telephone Encounter (Signed)
Contacted Amy @ [facility] to give her a verbal order for OT for patient - 2 times a week for 5 weeks per Dr. Letta Pate.

## 2013-04-05 ENCOUNTER — Inpatient Hospital Stay: Payer: Medicare Other | Admitting: Physical Medicine & Rehabilitation

## 2013-04-05 DIAGNOSIS — K219 Gastro-esophageal reflux disease without esophagitis: Secondary | ICD-10-CM | POA: Diagnosis not present

## 2013-04-05 DIAGNOSIS — I4891 Unspecified atrial fibrillation: Secondary | ICD-10-CM | POA: Diagnosis not present

## 2013-04-05 DIAGNOSIS — M6281 Muscle weakness (generalized): Secondary | ICD-10-CM | POA: Diagnosis not present

## 2013-04-05 DIAGNOSIS — I10 Essential (primary) hypertension: Secondary | ICD-10-CM | POA: Diagnosis not present

## 2013-04-05 DIAGNOSIS — IMO0001 Reserved for inherently not codable concepts without codable children: Secondary | ICD-10-CM | POA: Diagnosis not present

## 2013-04-05 DIAGNOSIS — I69959 Hemiplegia and hemiparesis following unspecified cerebrovascular disease affecting unspecified side: Secondary | ICD-10-CM | POA: Diagnosis not present

## 2013-04-09 ENCOUNTER — Encounter: Payer: Self-pay | Admitting: Family

## 2013-04-09 DIAGNOSIS — I10 Essential (primary) hypertension: Secondary | ICD-10-CM | POA: Diagnosis not present

## 2013-04-09 DIAGNOSIS — IMO0001 Reserved for inherently not codable concepts without codable children: Secondary | ICD-10-CM | POA: Diagnosis not present

## 2013-04-09 DIAGNOSIS — K219 Gastro-esophageal reflux disease without esophagitis: Secondary | ICD-10-CM | POA: Diagnosis not present

## 2013-04-09 DIAGNOSIS — M6281 Muscle weakness (generalized): Secondary | ICD-10-CM | POA: Diagnosis not present

## 2013-04-09 DIAGNOSIS — I69959 Hemiplegia and hemiparesis following unspecified cerebrovascular disease affecting unspecified side: Secondary | ICD-10-CM | POA: Diagnosis not present

## 2013-04-09 DIAGNOSIS — I4891 Unspecified atrial fibrillation: Secondary | ICD-10-CM | POA: Diagnosis not present

## 2013-04-10 DIAGNOSIS — M6281 Muscle weakness (generalized): Secondary | ICD-10-CM | POA: Diagnosis not present

## 2013-04-10 DIAGNOSIS — I69959 Hemiplegia and hemiparesis following unspecified cerebrovascular disease affecting unspecified side: Secondary | ICD-10-CM | POA: Diagnosis not present

## 2013-04-10 DIAGNOSIS — I10 Essential (primary) hypertension: Secondary | ICD-10-CM | POA: Diagnosis not present

## 2013-04-10 DIAGNOSIS — IMO0001 Reserved for inherently not codable concepts without codable children: Secondary | ICD-10-CM | POA: Diagnosis not present

## 2013-04-10 DIAGNOSIS — I4891 Unspecified atrial fibrillation: Secondary | ICD-10-CM | POA: Diagnosis not present

## 2013-04-10 DIAGNOSIS — K219 Gastro-esophageal reflux disease without esophagitis: Secondary | ICD-10-CM | POA: Diagnosis not present

## 2013-04-11 DIAGNOSIS — I69959 Hemiplegia and hemiparesis following unspecified cerebrovascular disease affecting unspecified side: Secondary | ICD-10-CM | POA: Diagnosis not present

## 2013-04-11 DIAGNOSIS — IMO0001 Reserved for inherently not codable concepts without codable children: Secondary | ICD-10-CM | POA: Diagnosis not present

## 2013-04-11 DIAGNOSIS — I4891 Unspecified atrial fibrillation: Secondary | ICD-10-CM | POA: Diagnosis not present

## 2013-04-11 DIAGNOSIS — M6281 Muscle weakness (generalized): Secondary | ICD-10-CM | POA: Diagnosis not present

## 2013-04-11 DIAGNOSIS — K219 Gastro-esophageal reflux disease without esophagitis: Secondary | ICD-10-CM | POA: Diagnosis not present

## 2013-04-11 DIAGNOSIS — I10 Essential (primary) hypertension: Secondary | ICD-10-CM | POA: Diagnosis not present

## 2013-04-12 ENCOUNTER — Telehealth: Payer: Self-pay

## 2013-04-12 NOTE — Telephone Encounter (Signed)
[name] (OT @ [facility]) is requesting a verbal order for patient to receive a home health aide to help with ADL's. Is this okay?

## 2013-04-12 NOTE — Telephone Encounter (Signed)
ok 

## 2013-04-12 NOTE — Telephone Encounter (Signed)
Contacted Mike @ [facility] to give him the Verbal okay to have a home health aide go to the patient's home to help with ADL's per Dr. Letta Pate.

## 2013-04-13 DIAGNOSIS — K219 Gastro-esophageal reflux disease without esophagitis: Secondary | ICD-10-CM | POA: Diagnosis not present

## 2013-04-13 DIAGNOSIS — I69959 Hemiplegia and hemiparesis following unspecified cerebrovascular disease affecting unspecified side: Secondary | ICD-10-CM | POA: Diagnosis not present

## 2013-04-13 DIAGNOSIS — I10 Essential (primary) hypertension: Secondary | ICD-10-CM | POA: Diagnosis not present

## 2013-04-13 DIAGNOSIS — M6281 Muscle weakness (generalized): Secondary | ICD-10-CM | POA: Diagnosis not present

## 2013-04-13 DIAGNOSIS — IMO0001 Reserved for inherently not codable concepts without codable children: Secondary | ICD-10-CM | POA: Diagnosis not present

## 2013-04-13 DIAGNOSIS — I4891 Unspecified atrial fibrillation: Secondary | ICD-10-CM | POA: Diagnosis not present

## 2013-04-16 ENCOUNTER — Ambulatory Visit (HOSPITAL_BASED_OUTPATIENT_CLINIC_OR_DEPARTMENT_OTHER)
Admission: RE | Admit: 2013-04-16 | Discharge: 2013-04-16 | Disposition: A | Discharge status: Home / Self Care | Payer: Medicare Other | Source: Ambulatory Visit | Attending: Family | Admitting: Family

## 2013-04-16 ENCOUNTER — Ambulatory Visit (INDEPENDENT_AMBULATORY_CARE_PROVIDER_SITE_OTHER): Payer: Medicare Other | Admitting: Family

## 2013-04-16 ENCOUNTER — Encounter: Payer: Self-pay | Admitting: Family

## 2013-04-16 VITALS — BP 110/70 | HR 88 | Temp 97.8°F | Resp 16 | Ht 67.0 in | Wt 118.0 lb

## 2013-04-16 DIAGNOSIS — M79609 Pain in unspecified limb: Secondary | ICD-10-CM | POA: Diagnosis not present

## 2013-04-16 DIAGNOSIS — I10 Essential (primary) hypertension: Secondary | ICD-10-CM | POA: Diagnosis not present

## 2013-04-16 DIAGNOSIS — M79643 Pain in unspecified hand: Secondary | ICD-10-CM

## 2013-04-16 DIAGNOSIS — K219 Gastro-esophageal reflux disease without esophagitis: Secondary | ICD-10-CM | POA: Diagnosis not present

## 2013-04-16 DIAGNOSIS — M19049 Primary osteoarthritis, unspecified hand: Secondary | ICD-10-CM | POA: Diagnosis not present

## 2013-04-16 DIAGNOSIS — I4891 Unspecified atrial fibrillation: Secondary | ICD-10-CM | POA: Diagnosis not present

## 2013-04-16 DIAGNOSIS — M6281 Muscle weakness (generalized): Secondary | ICD-10-CM | POA: Diagnosis not present

## 2013-04-16 DIAGNOSIS — M25539 Pain in unspecified wrist: Secondary | ICD-10-CM

## 2013-04-16 DIAGNOSIS — IMO0001 Reserved for inherently not codable concepts without codable children: Secondary | ICD-10-CM | POA: Diagnosis not present

## 2013-04-16 DIAGNOSIS — I69959 Hemiplegia and hemiparesis following unspecified cerebrovascular disease affecting unspecified side: Secondary | ICD-10-CM | POA: Diagnosis not present

## 2013-04-16 DIAGNOSIS — M25532 Pain in left wrist: Secondary | ICD-10-CM

## 2013-04-16 LAB — URIC ACID: Uric Acid, Serum: 3.9 mg/dL (ref 2.4–7.0)

## 2013-04-16 MED ORDER — RIVAROXABAN 15 MG PO TABS: 15.0000 mg | ORAL_TABLET | Freq: Every day | ORAL | Status: DC

## 2013-04-16 NOTE — Patient Instructions (Addendum)
Please complete lab work prior to leaving. Please complete x-ray on the first floor prior to leaving. Follow up in February as scheduled or sooner if symptoms worsen.

## 2013-04-16 NOTE — Progress Notes (Signed)
Pre visit review using our clinic review tool, if applicable. No additional management support is needed unless otherwise documented below in the visit note. 

## 2013-04-16 NOTE — Progress Notes (Signed)
Subjective:    Patient ID: [name], female    DOB: 10-06-1924, 80 y.o.   MRN: [id number]  Wrist Pain  Pertinent negatives include no fever.   [name] is an 80 year old female who presents today with a chief complaint of left hand and wrist pain and swelling since yesterday.  Patient denies recent injury/fall. Reports tylenol and heat application helps with temporary relief. Patient reports pain is worse upon movement. Patient also reporting intermittent pain to left foot and describes as "a knifelike pain between my middle and ring toe."     Review of Systems  Constitutional: Negative for fever and chills.  HENT: Negative for rhinorrhea.   Respiratory: Negative for cough and shortness of breath.   Cardiovascular: Negative for chest pain.  Musculoskeletal: Positive for arthralgias.       Pain and stiffness to left hand and wrist.       Past Medical History  Diagnosis Date  . Arthritis   . Hypertension   . Migraine   . Colon polyp   . Hearing difficulty   . Torus palatinus   . Atrial flutter January, 2012  . Stroke 08/02/12     right lenticular nucleus infarct  . Left leg weakness 02/05/2013  . Chronic anticoagulation   . High cholesterol     History   Social History  . Marital Status: Widowed    Spouse Name: N/A    Number of Children: 3  . Years of Education: college   Occupational History  . editorial work for [employer]    Social History Main Topics  . Smoking status: Former Research scientist (life sciences)  . Smokeless tobacco: Never Used  . Alcohol Use: 0.0 oz/week     Comment: 08/01/2012 "glass of wine on special occasions"  . Drug Use: No  . Sexual Activity: No   Other Topics Concern  . Not on file   Social History Narrative  . No narrative on file    Past Surgical History  Procedure Laterality Date  . Cataract extraction w/ intraocular lens  implant, bilateral  2006-2008  . Ganglion cyst excision Bilateral 1938,1954,2003,2005    "wrists/hand" (08/01/2012)  .  Cardioversion  05/19/2010    Dr. Einar Gip  . Tonsillectomy  ~ 1935  . Appendectomy  02/19/53    `    Family History  Problem Relation Age of Onset  . Colon cancer    . Breast cancer    . Brain cancer    . Cancer Mother     breast  . Aneurysm Mother     brain  . Heart attack Neg Hx   . Diabetes Neg Hx   . Hypertension Neg Hx     Allergies  Allergen Reactions  . Tramadol Hcl Other (See Comments)     lethargy, nausea  . Alendronate Sodium Rash and Other (See Comments)    puffy eyes    Current Outpatient Prescriptions on File Prior to Visit  Medication Sig Dispense Refill  . amLODipine (NORVASC) 2.5 MG tablet Take 2.5 mg by mouth daily as needed.      Marland Kitchen aspirin-acetaminophen-caffeine (EXCEDRIN MIGRAINE) 250-250-65 MG per tablet Take 2 tablets by mouth daily as needed for pain (for migraines).      Marland Kitchen atorvastatin (LIPITOR) 40 MG tablet Take 1 tablet (40 mg total) by mouth daily at 6 PM.  30 tablet  0  . Calcium Carbonate (CALCI-CHEW PO) Take 1 tablet by mouth 3 (three) times daily.      Marland Kitchen  cholecalciferol (VITAMIN D) 1000 UNITS tablet Take 2,000 Units by mouth daily.      . divalproex (DEPAKOTE) 125 MG DR tablet Take 3 tablets (375 mg total) by mouth 2 (two) times daily.  180 tablet  2  . docusate sodium (COLACE) 100 MG capsule Take 100 mg by mouth 2 (two) times daily.       . Multiple Vitamin (MULTIVITAMIN WITH MINERALS) TABS Take 1 tablet by mouth daily.      . pantoprazole (PROTONIX) 40 MG tablet Take 1 tablet (40 mg total) by mouth daily.  30 tablet  1  . sodium chloride (OCEAN) 0.65 % SOLN nasal spray Place 1 spray into both nostrils as needed for congestion.  1 Bottle  0   No current facility-administered medications on file prior to visit.    BP 110/70  Pulse 88  Temp(Src) 97.8 F (36.6 C) (Oral)  Resp 16  Ht 5\' 7"  (1.702 m)  Wt 118 lb (53.524 kg)  BMI 18.48 kg/m2  SpO2 95%    Objective:   Physical Exam  Constitutional: She is oriented to person, place, and  time. She appears well-nourished.  HENT:  Head: Normocephalic.  Cardiovascular: Normal rate.   Pulmonary/Chest: Breath sounds normal. No respiratory distress. She has no wheezes.  Musculoskeletal: She exhibits edema and tenderness.  Neurological: She is alert and oriented to person, place, and time.          Assessment & Plan:  I have personally seen and examined patient and agree with Jerrel Ivory NP student's assessment and plan.

## 2013-04-17 ENCOUNTER — Telehealth: Payer: Self-pay | Admitting: Family

## 2013-04-17 DIAGNOSIS — I69959 Hemiplegia and hemiparesis following unspecified cerebrovascular disease affecting unspecified side: Secondary | ICD-10-CM | POA: Diagnosis not present

## 2013-04-17 DIAGNOSIS — M6281 Muscle weakness (generalized): Secondary | ICD-10-CM | POA: Diagnosis not present

## 2013-04-17 DIAGNOSIS — I10 Essential (primary) hypertension: Secondary | ICD-10-CM | POA: Diagnosis not present

## 2013-04-17 DIAGNOSIS — M25532 Pain in left wrist: Secondary | ICD-10-CM | POA: Insufficient documentation

## 2013-04-17 DIAGNOSIS — K219 Gastro-esophageal reflux disease without esophagitis: Secondary | ICD-10-CM | POA: Diagnosis not present

## 2013-04-17 DIAGNOSIS — IMO0001 Reserved for inherently not codable concepts without codable children: Secondary | ICD-10-CM | POA: Diagnosis not present

## 2013-04-17 DIAGNOSIS — I4891 Unspecified atrial fibrillation: Secondary | ICD-10-CM | POA: Diagnosis not present

## 2013-04-17 NOTE — Telephone Encounter (Signed)
X ray is normal and uric acid (gout test) is normal.  I would recommend tylenol, ice to hand wrist as needed.  If symptoms worsen or if not improved in 1 week, let me know and I will place referral to orthopedics.

## 2013-04-17 NOTE — Telephone Encounter (Signed)
Notified pts daughter

## 2013-04-17 NOTE — Assessment & Plan Note (Signed)
X ray wrist neg, uric acid neg.   ? OA. Recommend tylenol, ice prn, refer to ortho if symptoms worsen or do not improve in 1 week.

## 2013-04-18 DIAGNOSIS — I69959 Hemiplegia and hemiparesis following unspecified cerebrovascular disease affecting unspecified side: Secondary | ICD-10-CM | POA: Diagnosis not present

## 2013-04-18 DIAGNOSIS — I10 Essential (primary) hypertension: Secondary | ICD-10-CM | POA: Diagnosis not present

## 2013-04-18 DIAGNOSIS — IMO0001 Reserved for inherently not codable concepts without codable children: Secondary | ICD-10-CM | POA: Diagnosis not present

## 2013-04-18 DIAGNOSIS — K219 Gastro-esophageal reflux disease without esophagitis: Secondary | ICD-10-CM | POA: Diagnosis not present

## 2013-04-18 DIAGNOSIS — I4891 Unspecified atrial fibrillation: Secondary | ICD-10-CM | POA: Diagnosis not present

## 2013-04-18 DIAGNOSIS — M6281 Muscle weakness (generalized): Secondary | ICD-10-CM | POA: Diagnosis not present

## 2013-04-19 DIAGNOSIS — IMO0001 Reserved for inherently not codable concepts without codable children: Secondary | ICD-10-CM | POA: Diagnosis not present

## 2013-04-19 DIAGNOSIS — K219 Gastro-esophageal reflux disease without esophagitis: Secondary | ICD-10-CM | POA: Diagnosis not present

## 2013-04-19 DIAGNOSIS — M6281 Muscle weakness (generalized): Secondary | ICD-10-CM | POA: Diagnosis not present

## 2013-04-19 DIAGNOSIS — I10 Essential (primary) hypertension: Secondary | ICD-10-CM | POA: Diagnosis not present

## 2013-04-19 DIAGNOSIS — I69959 Hemiplegia and hemiparesis following unspecified cerebrovascular disease affecting unspecified side: Secondary | ICD-10-CM | POA: Diagnosis not present

## 2013-04-19 DIAGNOSIS — I4891 Unspecified atrial fibrillation: Secondary | ICD-10-CM | POA: Diagnosis not present

## 2013-04-20 DIAGNOSIS — S42023A Displaced fracture of shaft of unspecified clavicle, initial encounter for closed fracture: Secondary | ICD-10-CM | POA: Diagnosis not present

## 2013-04-23 DIAGNOSIS — I10 Essential (primary) hypertension: Secondary | ICD-10-CM | POA: Diagnosis not present

## 2013-04-23 DIAGNOSIS — IMO0001 Reserved for inherently not codable concepts without codable children: Secondary | ICD-10-CM | POA: Diagnosis not present

## 2013-04-23 DIAGNOSIS — M6281 Muscle weakness (generalized): Secondary | ICD-10-CM | POA: Diagnosis not present

## 2013-04-23 DIAGNOSIS — I69959 Hemiplegia and hemiparesis following unspecified cerebrovascular disease affecting unspecified side: Secondary | ICD-10-CM | POA: Diagnosis not present

## 2013-04-23 DIAGNOSIS — K219 Gastro-esophageal reflux disease without esophagitis: Secondary | ICD-10-CM | POA: Diagnosis not present

## 2013-04-23 DIAGNOSIS — I4891 Unspecified atrial fibrillation: Secondary | ICD-10-CM | POA: Diagnosis not present

## 2013-04-24 DIAGNOSIS — I4891 Unspecified atrial fibrillation: Secondary | ICD-10-CM | POA: Diagnosis not present

## 2013-04-24 DIAGNOSIS — I10 Essential (primary) hypertension: Secondary | ICD-10-CM | POA: Diagnosis not present

## 2013-04-24 DIAGNOSIS — K219 Gastro-esophageal reflux disease without esophagitis: Secondary | ICD-10-CM | POA: Diagnosis not present

## 2013-04-24 DIAGNOSIS — IMO0001 Reserved for inherently not codable concepts without codable children: Secondary | ICD-10-CM | POA: Diagnosis not present

## 2013-04-24 DIAGNOSIS — I69959 Hemiplegia and hemiparesis following unspecified cerebrovascular disease affecting unspecified side: Secondary | ICD-10-CM | POA: Diagnosis not present

## 2013-04-24 DIAGNOSIS — M6281 Muscle weakness (generalized): Secondary | ICD-10-CM | POA: Diagnosis not present

## 2013-04-25 DIAGNOSIS — I4891 Unspecified atrial fibrillation: Secondary | ICD-10-CM | POA: Diagnosis not present

## 2013-04-25 DIAGNOSIS — I69959 Hemiplegia and hemiparesis following unspecified cerebrovascular disease affecting unspecified side: Secondary | ICD-10-CM | POA: Diagnosis not present

## 2013-04-25 DIAGNOSIS — I10 Essential (primary) hypertension: Secondary | ICD-10-CM | POA: Diagnosis not present

## 2013-04-25 DIAGNOSIS — K219 Gastro-esophageal reflux disease without esophagitis: Secondary | ICD-10-CM | POA: Diagnosis not present

## 2013-04-25 DIAGNOSIS — M6281 Muscle weakness (generalized): Secondary | ICD-10-CM | POA: Diagnosis not present

## 2013-04-25 DIAGNOSIS — IMO0001 Reserved for inherently not codable concepts without codable children: Secondary | ICD-10-CM | POA: Diagnosis not present

## 2013-04-26 DIAGNOSIS — I69959 Hemiplegia and hemiparesis following unspecified cerebrovascular disease affecting unspecified side: Secondary | ICD-10-CM | POA: Diagnosis not present

## 2013-04-26 DIAGNOSIS — M6281 Muscle weakness (generalized): Secondary | ICD-10-CM | POA: Diagnosis not present

## 2013-04-26 DIAGNOSIS — IMO0001 Reserved for inherently not codable concepts without codable children: Secondary | ICD-10-CM | POA: Diagnosis not present

## 2013-04-26 DIAGNOSIS — I10 Essential (primary) hypertension: Secondary | ICD-10-CM | POA: Diagnosis not present

## 2013-04-26 DIAGNOSIS — I4891 Unspecified atrial fibrillation: Secondary | ICD-10-CM | POA: Diagnosis not present

## 2013-04-26 DIAGNOSIS — K219 Gastro-esophageal reflux disease without esophagitis: Secondary | ICD-10-CM | POA: Diagnosis not present

## 2013-04-30 DIAGNOSIS — I10 Essential (primary) hypertension: Secondary | ICD-10-CM | POA: Diagnosis not present

## 2013-04-30 DIAGNOSIS — K219 Gastro-esophageal reflux disease without esophagitis: Secondary | ICD-10-CM | POA: Diagnosis not present

## 2013-04-30 DIAGNOSIS — M6281 Muscle weakness (generalized): Secondary | ICD-10-CM | POA: Diagnosis not present

## 2013-04-30 DIAGNOSIS — I4891 Unspecified atrial fibrillation: Secondary | ICD-10-CM | POA: Diagnosis not present

## 2013-04-30 DIAGNOSIS — IMO0001 Reserved for inherently not codable concepts without codable children: Secondary | ICD-10-CM | POA: Diagnosis not present

## 2013-04-30 DIAGNOSIS — I69959 Hemiplegia and hemiparesis following unspecified cerebrovascular disease affecting unspecified side: Secondary | ICD-10-CM | POA: Diagnosis not present

## 2013-05-01 DIAGNOSIS — I10 Essential (primary) hypertension: Secondary | ICD-10-CM | POA: Diagnosis not present

## 2013-05-01 DIAGNOSIS — I4891 Unspecified atrial fibrillation: Secondary | ICD-10-CM | POA: Diagnosis not present

## 2013-05-01 DIAGNOSIS — IMO0001 Reserved for inherently not codable concepts without codable children: Secondary | ICD-10-CM | POA: Diagnosis not present

## 2013-05-01 DIAGNOSIS — M6281 Muscle weakness (generalized): Secondary | ICD-10-CM | POA: Diagnosis not present

## 2013-05-01 DIAGNOSIS — I69959 Hemiplegia and hemiparesis following unspecified cerebrovascular disease affecting unspecified side: Secondary | ICD-10-CM | POA: Diagnosis not present

## 2013-05-01 DIAGNOSIS — K219 Gastro-esophageal reflux disease without esophagitis: Secondary | ICD-10-CM | POA: Diagnosis not present

## 2013-05-03 DIAGNOSIS — M6281 Muscle weakness (generalized): Secondary | ICD-10-CM | POA: Diagnosis not present

## 2013-05-03 DIAGNOSIS — Z9181 History of falling: Secondary | ICD-10-CM | POA: Diagnosis not present

## 2013-05-03 DIAGNOSIS — I69959 Hemiplegia and hemiparesis following unspecified cerebrovascular disease affecting unspecified side: Secondary | ICD-10-CM | POA: Diagnosis not present

## 2013-05-03 DIAGNOSIS — I10 Essential (primary) hypertension: Secondary | ICD-10-CM | POA: Diagnosis not present

## 2013-05-03 DIAGNOSIS — IMO0001 Reserved for inherently not codable concepts without codable children: Secondary | ICD-10-CM | POA: Diagnosis not present

## 2013-05-03 DIAGNOSIS — I4891 Unspecified atrial fibrillation: Secondary | ICD-10-CM | POA: Diagnosis not present

## 2013-05-04 ENCOUNTER — Encounter: Payer: Self-pay | Admitting: Family

## 2013-05-04 ENCOUNTER — Ambulatory Visit (INDEPENDENT_AMBULATORY_CARE_PROVIDER_SITE_OTHER): Payer: Medicare Other | Admitting: Family

## 2013-05-04 VITALS — BP 142/80 | HR 65 | Temp 97.4°F | Resp 18 | Ht 67.0 in

## 2013-05-04 DIAGNOSIS — L89321 Pressure ulcer of left buttock, stage 1: Secondary | ICD-10-CM

## 2013-05-04 DIAGNOSIS — L89309 Pressure ulcer of unspecified buttock, unspecified stage: Secondary | ICD-10-CM

## 2013-05-04 DIAGNOSIS — K5900 Constipation, unspecified: Secondary | ICD-10-CM | POA: Diagnosis not present

## 2013-05-04 DIAGNOSIS — S42009A Fracture of unspecified part of unspecified clavicle, initial encounter for closed fracture: Secondary | ICD-10-CM

## 2013-05-04 DIAGNOSIS — S42002A Fracture of unspecified part of left clavicle, initial encounter for closed fracture: Secondary | ICD-10-CM

## 2013-05-04 DIAGNOSIS — E7800 Pure hypercholesterolemia, unspecified: Secondary | ICD-10-CM

## 2013-05-04 DIAGNOSIS — L8991 Pressure ulcer of unspecified site, stage 1: Secondary | ICD-10-CM

## 2013-05-04 DIAGNOSIS — L89311 Pressure ulcer of right buttock, stage 1: Secondary | ICD-10-CM

## 2013-05-04 MED ORDER — PANTOPRAZOLE SODIUM 40 MG PO TBEC: 40.0000 mg | DELAYED_RELEASE_TABLET | Freq: Every day | ORAL | Status: DC

## 2013-05-04 NOTE — Patient Instructions (Addendum)
You will be contacted about [facility] coming to your home. Please follow up in 1 month so I can see how your skin is doing.

## 2013-05-04 NOTE — Progress Notes (Signed)
Subjective:    Patient ID: [name], female    DOB: 1924-09-26, 80 y.o.   MRN: [id number]  HPI  Ms. Cast is an 80 yr old female who presents today for follow up:  1) Constipation- Pt notes improvement in constipation. She has not needed to use any miralax.    2) pressure ulcer- daughter notes area of redness sacral area.    3) clavicle frature- she is now able to use the left arm more.  She is following with ortho and PT.     Review of Systems See HPI  Past Medical History  Diagnosis Date  . Arthritis   . Hypertension   . Migraine   . Colon polyp   . Hearing difficulty   . Torus palatinus   . Atrial flutter January, 2012  . Stroke 08/02/12     right lenticular nucleus infarct  . Left leg weakness 02/05/2013  . Chronic anticoagulation   . High cholesterol     History   Social History  . Marital Status: Widowed    Spouse Name: N/A    Number of Children: 3  . Years of Education: college   Occupational History  . editorial work for [employer]    Social History Main Topics  . Smoking status: Former Research scientist (life sciences)  . Smokeless tobacco: Never Used  . Alcohol Use: 0.0 oz/week     Comment: 08/01/2012 "glass of wine on special occasions"  . Drug Use: No  . Sexual Activity: No   Other Topics Concern  . Not on file   Social History Narrative  . No narrative on file    Past Surgical History  Procedure Laterality Date  . Cataract extraction w/ intraocular lens  implant, bilateral  2006-2008  . Ganglion cyst excision Bilateral 1938,1954,2003,2005    "wrists/hand" (08/01/2012)  . Cardioversion  05/19/2010    Dr. Einar Gip  . Tonsillectomy  ~ 1935  . Appendectomy  02/19/53    `    Family History  Problem Relation Age of Onset  . Colon cancer    . Breast cancer    . Brain cancer    . Cancer Mother     breast  . Aneurysm Mother     brain  . Heart attack Neg Hx   . Diabetes Neg Hx   . Hypertension Neg Hx     Allergies  Allergen Reactions  . Tramadol Hcl  Other (See Comments)     lethargy, nausea  . Alendronate Sodium Rash and Other (See Comments)    puffy eyes    Current Outpatient Prescriptions on File Prior to Visit  Medication Sig Dispense Refill  . amLODipine (NORVASC) 2.5 MG tablet Take 2.5 mg by mouth daily as needed.      Marland Kitchen aspirin-acetaminophen-caffeine (EXCEDRIN MIGRAINE) 250-250-65 MG per tablet Take 2 tablets by mouth daily as needed for pain (for migraines).      Marland Kitchen atorvastatin (LIPITOR) 40 MG tablet Take 1 tablet (40 mg total) by mouth daily at 6 PM.  30 tablet  0  . Calcium-Magnesium-Vitamin D (CALCIUM 500 PO) Take 1 tablet by mouth 2 (two) times daily.      . cholecalciferol (VITAMIN D) 1000 UNITS tablet Take 2,000 Units by mouth daily.      . divalproex (DEPAKOTE) 125 MG DR tablet Take 3 tablets (375 mg total) by mouth 2 (two) times daily.  180 tablet  2  . docusate sodium (COLACE) 100 MG capsule Take 100 mg by mouth  2 (two) times daily.       . Multiple Vitamin (MULTIVITAMIN WITH MINERALS) TABS Take 1 tablet by mouth daily.      . Rivaroxaban (XARELTO) 15 MG TABS tablet Take 1 tablet (15 mg total) by mouth daily with supper.  30 tablet  5  . sodium chloride (OCEAN) 0.65 % SOLN nasal spray Place 1 spray into both nostrils as needed for congestion.  1 Bottle  0   No current facility-administered medications on file prior to visit.    BP 142/80  Pulse 65  Temp(Src) 97.4 F (36.3 C) (Oral)  Resp 18  Ht 5\' 7"  (1.702 m)  SpO2 97%       Objective:   Physical Exam  Constitutional: She is oriented to person, place, and time. She appears well-developed and well-nourished. No distress.  Cardiovascular: Normal rate and regular rhythm.   No murmur heard. Pulmonary/Chest: Effort normal and breath sounds normal. No respiratory distress. She has no wheezes. She has no rales. She exhibits no tenderness.  Musculoskeletal: She exhibits no edema.  Neurological: She is alert and oriented to person, place, and time.  Skin: Skin is  warm and dry.  Stage 1 pressure ulcer right buttock  Psychiatric: She has a normal mood and affect. Her behavior is normal. Judgment and thought content normal.           Assessment & Plan:

## 2013-05-04 NOTE — Progress Notes (Signed)
Pre visit review using our clinic review tool, if applicable. No additional management support is needed unless otherwise documented below in the visit note. 

## 2013-05-05 DIAGNOSIS — L89311 Pressure ulcer of right buttock, stage 1: Secondary | ICD-10-CM | POA: Insufficient documentation

## 2013-05-05 NOTE — Assessment & Plan Note (Signed)
Resolved

## 2013-05-05 NOTE — Assessment & Plan Note (Signed)
Clinically improving, management per ortho.

## 2013-05-05 NOTE — Assessment & Plan Note (Signed)
Cholesterol looks great on statin. Continue for secondary stroke prevention.

## 2013-05-05 NOTE — Assessment & Plan Note (Signed)
Will request [facility] RN to assess pt in the home and provide gel pad for her chair.

## 2013-05-08 DIAGNOSIS — M6281 Muscle weakness (generalized): Secondary | ICD-10-CM | POA: Diagnosis not present

## 2013-05-08 DIAGNOSIS — I4891 Unspecified atrial fibrillation: Secondary | ICD-10-CM | POA: Diagnosis not present

## 2013-05-08 DIAGNOSIS — IMO0001 Reserved for inherently not codable concepts without codable children: Secondary | ICD-10-CM | POA: Diagnosis not present

## 2013-05-08 DIAGNOSIS — I10 Essential (primary) hypertension: Secondary | ICD-10-CM | POA: Diagnosis not present

## 2013-05-08 DIAGNOSIS — I69959 Hemiplegia and hemiparesis following unspecified cerebrovascular disease affecting unspecified side: Secondary | ICD-10-CM | POA: Diagnosis not present

## 2013-05-09 ENCOUNTER — Encounter: Payer: Self-pay | Admitting: Family

## 2013-05-09 NOTE — Telephone Encounter (Signed)
Could you please ask [facility] to do the home care instead of advanced home care?

## 2013-05-10 DIAGNOSIS — I4891 Unspecified atrial fibrillation: Secondary | ICD-10-CM | POA: Diagnosis not present

## 2013-05-10 DIAGNOSIS — I10 Essential (primary) hypertension: Secondary | ICD-10-CM | POA: Diagnosis not present

## 2013-05-10 DIAGNOSIS — IMO0001 Reserved for inherently not codable concepts without codable children: Secondary | ICD-10-CM | POA: Diagnosis not present

## 2013-05-10 DIAGNOSIS — M6281 Muscle weakness (generalized): Secondary | ICD-10-CM | POA: Diagnosis not present

## 2013-05-10 DIAGNOSIS — I69959 Hemiplegia and hemiparesis following unspecified cerebrovascular disease affecting unspecified side: Secondary | ICD-10-CM | POA: Diagnosis not present

## 2013-05-14 ENCOUNTER — Telehealth: Payer: Self-pay | Admitting: Family

## 2013-05-14 DIAGNOSIS — Z9189 Other specified personal risk factors, not elsewhere classified: Secondary | ICD-10-CM

## 2013-05-14 NOTE — Telephone Encounter (Signed)
Coricidin HBP should be safe as needed.

## 2013-05-14 NOTE — Telephone Encounter (Signed)
Patient daughter called in stating that patient is starting to have a cold and would like to know what OTC medication she could give her? She does not want anything to interact with patient medication

## 2013-05-16 ENCOUNTER — Ambulatory Visit: Payer: Medicare Other | Admitting: Neurology

## 2013-05-16 DIAGNOSIS — I4891 Unspecified atrial fibrillation: Secondary | ICD-10-CM | POA: Diagnosis not present

## 2013-05-16 DIAGNOSIS — I69959 Hemiplegia and hemiparesis following unspecified cerebrovascular disease affecting unspecified side: Secondary | ICD-10-CM | POA: Diagnosis not present

## 2013-05-16 DIAGNOSIS — I10 Essential (primary) hypertension: Secondary | ICD-10-CM | POA: Diagnosis not present

## 2013-05-16 DIAGNOSIS — IMO0001 Reserved for inherently not codable concepts without codable children: Secondary | ICD-10-CM | POA: Diagnosis not present

## 2013-05-16 DIAGNOSIS — M6281 Muscle weakness (generalized): Secondary | ICD-10-CM | POA: Diagnosis not present

## 2013-05-17 DIAGNOSIS — I10 Essential (primary) hypertension: Secondary | ICD-10-CM | POA: Diagnosis not present

## 2013-05-17 DIAGNOSIS — I4891 Unspecified atrial fibrillation: Secondary | ICD-10-CM | POA: Diagnosis not present

## 2013-05-17 DIAGNOSIS — M6281 Muscle weakness (generalized): Secondary | ICD-10-CM | POA: Diagnosis not present

## 2013-05-17 DIAGNOSIS — I69959 Hemiplegia and hemiparesis following unspecified cerebrovascular disease affecting unspecified side: Secondary | ICD-10-CM | POA: Diagnosis not present

## 2013-05-17 DIAGNOSIS — IMO0001 Reserved for inherently not codable concepts without codable children: Secondary | ICD-10-CM | POA: Diagnosis not present

## 2013-05-22 DIAGNOSIS — I10 Essential (primary) hypertension: Secondary | ICD-10-CM | POA: Diagnosis not present

## 2013-05-22 DIAGNOSIS — I4891 Unspecified atrial fibrillation: Secondary | ICD-10-CM | POA: Diagnosis not present

## 2013-05-22 DIAGNOSIS — M6281 Muscle weakness (generalized): Secondary | ICD-10-CM | POA: Diagnosis not present

## 2013-05-22 DIAGNOSIS — IMO0001 Reserved for inherently not codable concepts without codable children: Secondary | ICD-10-CM | POA: Diagnosis not present

## 2013-05-22 DIAGNOSIS — I69959 Hemiplegia and hemiparesis following unspecified cerebrovascular disease affecting unspecified side: Secondary | ICD-10-CM | POA: Diagnosis not present

## 2013-05-23 DIAGNOSIS — I10 Essential (primary) hypertension: Secondary | ICD-10-CM | POA: Diagnosis not present

## 2013-05-23 DIAGNOSIS — I4891 Unspecified atrial fibrillation: Secondary | ICD-10-CM | POA: Diagnosis not present

## 2013-05-23 DIAGNOSIS — IMO0001 Reserved for inherently not codable concepts without codable children: Secondary | ICD-10-CM | POA: Diagnosis not present

## 2013-05-23 DIAGNOSIS — I69959 Hemiplegia and hemiparesis following unspecified cerebrovascular disease affecting unspecified side: Secondary | ICD-10-CM | POA: Diagnosis not present

## 2013-05-23 DIAGNOSIS — M6281 Muscle weakness (generalized): Secondary | ICD-10-CM | POA: Diagnosis not present

## 2013-05-24 ENCOUNTER — Ambulatory Visit: Payer: Medicare Other | Admitting: Neurology

## 2013-05-25 DIAGNOSIS — M6281 Muscle weakness (generalized): Secondary | ICD-10-CM | POA: Diagnosis not present

## 2013-05-25 DIAGNOSIS — IMO0001 Reserved for inherently not codable concepts without codable children: Secondary | ICD-10-CM | POA: Diagnosis not present

## 2013-05-25 DIAGNOSIS — I10 Essential (primary) hypertension: Secondary | ICD-10-CM | POA: Diagnosis not present

## 2013-05-25 DIAGNOSIS — I69959 Hemiplegia and hemiparesis following unspecified cerebrovascular disease affecting unspecified side: Secondary | ICD-10-CM | POA: Diagnosis not present

## 2013-05-25 DIAGNOSIS — I4891 Unspecified atrial fibrillation: Secondary | ICD-10-CM | POA: Diagnosis not present

## 2013-05-28 DIAGNOSIS — I10 Essential (primary) hypertension: Secondary | ICD-10-CM | POA: Diagnosis not present

## 2013-05-28 DIAGNOSIS — I4891 Unspecified atrial fibrillation: Secondary | ICD-10-CM | POA: Diagnosis not present

## 2013-05-28 DIAGNOSIS — M6281 Muscle weakness (generalized): Secondary | ICD-10-CM | POA: Diagnosis not present

## 2013-05-28 DIAGNOSIS — I69959 Hemiplegia and hemiparesis following unspecified cerebrovascular disease affecting unspecified side: Secondary | ICD-10-CM | POA: Diagnosis not present

## 2013-05-28 DIAGNOSIS — IMO0001 Reserved for inherently not codable concepts without codable children: Secondary | ICD-10-CM | POA: Diagnosis not present

## 2013-05-29 DIAGNOSIS — I69959 Hemiplegia and hemiparesis following unspecified cerebrovascular disease affecting unspecified side: Secondary | ICD-10-CM | POA: Diagnosis not present

## 2013-05-29 DIAGNOSIS — I10 Essential (primary) hypertension: Secondary | ICD-10-CM | POA: Diagnosis not present

## 2013-05-29 DIAGNOSIS — IMO0001 Reserved for inherently not codable concepts without codable children: Secondary | ICD-10-CM | POA: Diagnosis not present

## 2013-05-29 DIAGNOSIS — I4891 Unspecified atrial fibrillation: Secondary | ICD-10-CM | POA: Diagnosis not present

## 2013-05-29 DIAGNOSIS — M6281 Muscle weakness (generalized): Secondary | ICD-10-CM | POA: Diagnosis not present

## 2013-05-31 ENCOUNTER — Encounter: Payer: Self-pay | Admitting: Neurology

## 2013-05-31 ENCOUNTER — Ambulatory Visit (INDEPENDENT_AMBULATORY_CARE_PROVIDER_SITE_OTHER): Payer: Medicare Other | Admitting: Neurology

## 2013-05-31 VITALS — BP 126/81 | HR 87 | Temp 97.5°F | Ht 67.0 in | Wt 116.0 lb

## 2013-05-31 DIAGNOSIS — I69959 Hemiplegia and hemiparesis following unspecified cerebrovascular disease affecting unspecified side: Secondary | ICD-10-CM | POA: Diagnosis not present

## 2013-05-31 DIAGNOSIS — R269 Unspecified abnormalities of gait and mobility: Secondary | ICD-10-CM | POA: Diagnosis not present

## 2013-05-31 DIAGNOSIS — R29818 Other symptoms and signs involving the nervous system: Secondary | ICD-10-CM

## 2013-05-31 DIAGNOSIS — M6281 Muscle weakness (generalized): Secondary | ICD-10-CM | POA: Diagnosis not present

## 2013-05-31 DIAGNOSIS — I4891 Unspecified atrial fibrillation: Secondary | ICD-10-CM | POA: Diagnosis not present

## 2013-05-31 DIAGNOSIS — IMO0001 Reserved for inherently not codable concepts without codable children: Secondary | ICD-10-CM | POA: Diagnosis not present

## 2013-05-31 DIAGNOSIS — I10 Essential (primary) hypertension: Secondary | ICD-10-CM | POA: Diagnosis not present

## 2013-05-31 NOTE — Progress Notes (Signed)
[facility] [address] [place]. [place] [address] [phone]       OFFICE FOLLOW-UP NOTE  Ms. KAYDAN MANCIAS Date of Birth:  06-18-1924 Medical Record Number:  [id number]   HPI: 77 year Caucasian lady seen for first office follow for the following hospital admission on 03/09/13 for left hemiparesis. She has remote history of right brain subcortical infarct in May 2014 felt to be secondary to atrial fibrillation has been on anticoagulation with cerebral since then. She was admitted multiple times with worsening of hemiparesis in the setting of infection or dehydration. She had an MRI in December 2014 which did not reveal any new right brain stroke but showed and he of restricted diffusion which is patchy in the left parietal region. At that time it was not clear whether this was a stroke or not however the patient's daughter was informed today that she had indeed had a fall a month prior in November 2014 that she had sustained scalp hematoma in the left temporal region and had been seen in the ER and a solitary CT scan of the head was unremarkable and she was sent back to the nursing home. In retrospect the MRI diffusion abnormality in the left parietal region in December 2014 they represent a delayed hemorrhage contusion which may have sustained from a fall in November 2014. It was planned to change as there are 2 to a liquids but the family never made the switch as they were unclear as to what the co-pay for a liquids would cost him. The patient seems to be tolerating that well without any further increased bleeding or bruising. She suffered from a bad cold for 2 weeks and had a minor nasal bleed which stopped. She is currently living at home and getting physical and occupational therapy. She is able to walk with a walker fairly well. She is carefully washed over by 3 daughters. She has had some mild memory difficulties but these are not progressive.  ROS:   14 system review of  systems is positive for apetite change, constipation, memory loss, decreased concentration and gait difficulty only and all other systems negative  PMH:  Past Medical History  Diagnosis Date  . Arthritis   . Hypertension   . Migraine   . Colon polyp   . Hearing difficulty   . Torus palatinus   . Atrial flutter January, 2012  . Stroke 08/02/12     right lenticular nucleus infarct  . Left leg weakness 02/05/2013  . Chronic anticoagulation   . High cholesterol     Social History:  History   Social History  . Marital Status: Widowed    Spouse Name: N/A    Number of Children: 3  . Years of Education: college   Occupational History  . editorial work for [employer]    Social History Main Topics  . Smoking status: Former Research scientist (life sciences)  . Smokeless tobacco: Never Used  . Alcohol Use: 0.0 oz/week     Comment: 08/01/2012 "glass of wine on special occasions"  . Drug Use: No  . Sexual Activity: No   Other Topics Concern  . Not on file   Social History Narrative   Patient lives at home with daughters.   Caffeine use: 1/2-1 cup of coffee occasionally    Medications:   Current Outpatient Prescriptions on File Prior to Visit  Medication Sig Dispense Refill  . aspirin-acetaminophen-caffeine (EXCEDRIN MIGRAINE) 250-250-65 MG per tablet Take 2 tablets by mouth daily as needed  for pain (for migraines).      . Calcium-Magnesium-Vitamin D (CALCIUM 500 PO) Take 1 tablet by mouth 2 (two) times daily.      . cholecalciferol (VITAMIN D) 1000 UNITS tablet Take 2,000 Units by mouth daily.      . divalproex (DEPAKOTE) 125 MG DR tablet Take 3 tablets (375 mg total) by mouth 2 (two) times daily.  180 tablet  2  . docusate sodium (COLACE) 100 MG capsule Take 100 mg by mouth 2 (two) times daily.       . Multiple Vitamin (MULTIVITAMIN WITH MINERALS) TABS Take 1 tablet by mouth daily.      . pantoprazole (PROTONIX) 40 MG tablet Take 1 tablet (40 mg total) by mouth daily.  90 tablet  1  . Rivaroxaban  (XARELTO) 15 MG TABS tablet Take 1 tablet (15 mg total) by mouth daily with supper.  30 tablet  5  . sodium chloride (OCEAN) 0.65 % SOLN nasal spray Place 1 spray into both nostrils as needed for congestion.  1 Bottle  0   No current facility-administered medications on file prior to visit.    Allergies:   Allergies  Allergen Reactions  . Tramadol Hcl Other (See Comments)     lethargy, nausea  . Alendronate Sodium Rash and Other (See Comments)    puffy eyes    Physical Exam General: Frail elderly Caucasian lady seated, in no evident distress Head: head normocephalic and atraumatic. Orohparynx benign Neck: supple with no carotid or supraclavicular bruits Cardiovascular: regular rate and rhythm, no murmurs Musculoskeletal: no deformity Skin:  no rash/petichiae Vascular:  Normal pulses all extremities Filed Vitals:   05/31/13 1431  BP: 126/81  Pulse: 87  Temp: 97.5 F (36.4 C)   Neurologic Exam Mental Status: Awake and fully alert. Oriented to place and time. Recent and remote memory intact. Attention span, concentration and fund of knowledge slightly diminished. Mood and affect appropriate.  Cranial Nerves: Fundoscopic exam reveals sharp disc margins. Pupils equal, briskly reactive to light. Extraocular movements full without nystagmus. Visual fields full to confrontation. Hearing diminished slightly bilaterally.. Facial sensation intact. Face, tongue, palate moves normally and symmetrically.  Motor: Normal bulk and tone. Normal strength in all tested extremity muscles except mild weakness of right grip and intrinsic hand muscles. Diminished fine finger movements on the left. Orbits right over left upper extremity. Sensory.: intact to touch and pinprick and vibratory sensation.  Coordination: Rapid alternating movements normal in all extremities. Finger-to-nose and heel-to-shin performed accurately bilaterally. Gait and Station: Arises from chair with mild difficulty. Stance is  stooped with mild kyphosis. Cautious gait with fair balance. Unable to heel, toe and tandem walk without difficulty.  Reflexes: 1+ and symmetric. Toes downgoing.   NIHSS 1 Modified Rankin  2   ASSESSMENT: 55 year with remote history of right hemispheric subcortical infarct in May 2014 with recent admission in December 2014 for transient worsening of left hemiparesis with MRI not showing any new right brain stroke. Left temporoparietal subacute lesion seen on MRI in December 2014 in fact may have been delayed hemorrhagic contusion   following a fall she sustained in November 2014 and was not recognized as such during admission in December 2014. She remains on xarelto for her atrial fibrillation/flutter and prior history of stroke rather than a silent infarct .Marland Kitchen    PLAN: I had a long discussion with the patient and her daughters regarding her stroke, recent fall, MRI findings and answered questions. Continue Xarelto for secondary stroke  prevention and I advised that developed slowly and avoid sudden movements and fall prevention. We will order outpatient physical and occupational therapy. She will return for followup in 3 months or call earlier if necessary    Note: This document was prepared with digital dictation and possible smart phrase technology. Any transcriptional errors that result from this process are unintentional

## 2013-05-31 NOTE — Patient Instructions (Signed)
I had a long discussion with the patient and her daughters regarding her stroke, recent fall, MRI findings and answered questions. Continue Xarelto for secondary stroke prevention and I advised that developed slowly and avoid sudden movements and fall prevention. We will order outpatient physical and occupational therapy. She will return for followup in 3 months or call earlier if necessary

## 2013-06-01 ENCOUNTER — Ambulatory Visit (INDEPENDENT_AMBULATORY_CARE_PROVIDER_SITE_OTHER): Payer: Medicare Other | Admitting: Family

## 2013-06-01 ENCOUNTER — Encounter: Payer: Self-pay | Admitting: Family

## 2013-06-01 VITALS — BP 110/70 | HR 79 | Temp 97.3°F | Resp 18 | Ht 67.0 in | Wt 116.0 lb

## 2013-06-01 DIAGNOSIS — IMO0001 Reserved for inherently not codable concepts without codable children: Secondary | ICD-10-CM

## 2013-06-01 DIAGNOSIS — M6281 Muscle weakness (generalized): Secondary | ICD-10-CM | POA: Diagnosis not present

## 2013-06-01 DIAGNOSIS — I69959 Hemiplegia and hemiparesis following unspecified cerebrovascular disease affecting unspecified side: Secondary | ICD-10-CM

## 2013-06-01 DIAGNOSIS — M949 Disorder of cartilage, unspecified: Secondary | ICD-10-CM | POA: Diagnosis not present

## 2013-06-01 DIAGNOSIS — Z8673 Personal history of transient ischemic attack (TIA), and cerebral infarction without residual deficits: Secondary | ICD-10-CM | POA: Diagnosis not present

## 2013-06-01 DIAGNOSIS — M899 Disorder of bone, unspecified: Secondary | ICD-10-CM

## 2013-06-01 NOTE — Progress Notes (Signed)
Pre visit review using our clinic review tool, if applicable. No additional management support is needed unless otherwise documented below in the visit note. 

## 2013-06-01 NOTE — Progress Notes (Signed)
Subjective:    Patient ID: [name], female    DOB: 1924-04-11, 80 y.o.   MRN: [id number]  HPI  [name] is an 80 yr old female who presents today for follow up of her pressure ulcer. Last visit on 2/6, she was noted to have a stage 1 pressure ulcer on the right buttock. We requested the [facility] RN to assess the pt in the home and provide a gel pad for her chair. The RN applied a patch. She is now back in cotton underwear and a poise pad. Alternates from laying in the bed.    Of note, she did see Dr. Leonie Man yesterday and he recommended that she continue xarelto for secondary stroke prevention.   Review of Systems See HPI  Past Medical History  Diagnosis Date  . Arthritis   . Hypertension   . Migraine   . Colon polyp   . Hearing difficulty   . Torus palatinus   . Atrial flutter January, 2012  . Stroke 08/02/12     right lenticular nucleus infarct  . Left leg weakness 02/05/2013  . Chronic anticoagulation   . High cholesterol     History   Social History  . Marital Status: Widowed    Spouse Name: N/A    Number of Children: 3  . Years of Education: college   Occupational History  . editorial work for [employer]    Social History Main Topics  . Smoking status: Former Research scientist (life sciences)  . Smokeless tobacco: Never Used  . Alcohol Use: 0.0 oz/week     Comment: 08/01/2012 "glass of wine on special occasions"  . Drug Use: No  . Sexual Activity: No   Other Topics Concern  . Not on file   Social History Narrative   Patient lives at home with daughters.   Caffeine use: 1/2-1 cup of coffee occasionally    Past Surgical History  Procedure Laterality Date  . Cataract extraction w/ intraocular lens  implant, bilateral  2006-2008  . Ganglion cyst excision Bilateral 1938,1954,2003,2005    "wrists/hand" (08/01/2012)  . Cardioversion  05/19/2010    Dr. Einar Gip  . Tonsillectomy  ~ 1935  . Appendectomy  02/19/53    `  . Mouth surgery      [name]    Family History  Problem Relation  Age of Onset  . Colon cancer    . Breast cancer    . Brain cancer    . Cancer Mother     breast  . Aneurysm Mother     brain  . Heart attack Neg Hx   . Diabetes Neg Hx   . Hypertension Neg Hx     Allergies  Allergen Reactions  . Tramadol Hcl Other (See Comments)     lethargy, nausea  . Alendronate Sodium Rash and Other (See Comments)    puffy eyes    Current Outpatient Prescriptions on File Prior to Visit  Medication Sig Dispense Refill  . aspirin-acetaminophen-caffeine (EXCEDRIN MIGRAINE) 250-250-65 MG per tablet Take 2 tablets by mouth daily as needed for pain (for migraines).      . Calcium-Magnesium-Vitamin D (CALCIUM 500 PO) Take 1 tablet by mouth 2 (two) times daily.      . cholecalciferol (VITAMIN D) 1000 UNITS tablet Take 2,000 Units by mouth daily.      . divalproex (DEPAKOTE) 125 MG DR tablet Take 3 tablets (375 mg total) by mouth 2 (two) times daily.  180 tablet  2  . docusate sodium (  COLACE) 100 MG capsule Take 100 mg by mouth 2 (two) times daily.       . Multiple Vitamin (MULTIVITAMIN WITH MINERALS) TABS Take 1 tablet by mouth daily.      . pantoprazole (PROTONIX) 40 MG tablet Take 1 tablet (40 mg total) by mouth daily.  90 tablet  1  . Rivaroxaban (XARELTO) 15 MG TABS tablet Take 1 tablet (15 mg total) by mouth daily with supper.  30 tablet  5  . sodium chloride (OCEAN) 0.65 % SOLN nasal spray Place 1 spray into both nostrils as needed for congestion.  1 Bottle  0   No current facility-administered medications on file prior to visit.    BP 110/70  Pulse 79  Temp(Src) 97.3 F (36.3 C) (Oral)  Resp 18  Ht 5\' 7"  (1.702 m)  Wt 116 lb (52.617 kg)  BMI 18.16 kg/m2  SpO2 98%       Objective:   Physical Exam  Constitutional: She is oriented to person, place, and time. She appears well-developed and well-nourished. No distress.  HENT:  Head: Normocephalic and atraumatic.  Cardiovascular: Normal rate and regular rhythm.   No murmur heard. Pulmonary/Chest:  Effort normal and breath sounds normal. No respiratory distress. She has no wheezes. She has no rales. She exhibits no tenderness.  Musculoskeletal: She exhibits no edema.  Neurological: She is alert and oriented to person, place, and time.  Skin: Skin is warm and dry.  No skin breakdown or redness noted on buttocks or sacrum  Psychiatric: She has a normal mood and affect. Her behavior is normal. Judgment and thought content normal.          Assessment & Plan:

## 2013-06-01 NOTE — Patient Instructions (Signed)
Please follow up in 4 months

## 2013-06-02 DIAGNOSIS — Z8673 Personal history of transient ischemic attack (TIA), and cerebral infarction without residual deficits: Secondary | ICD-10-CM | POA: Insufficient documentation

## 2013-06-02 NOTE — Telephone Encounter (Signed)
It looks like the message below never go to the pt, though I think her symptoms were resolved at the time I saw her.  Also, I do not see that she has had a recent bone density. With her recent fracture, I think it would be a good idea. I have pended below.

## 2013-06-02 NOTE — Assessment & Plan Note (Signed)
On xarelto- followed by neuro.  LDL at goal.  Lab Results  Component Value Date   CHOL 92 03/10/2013   HDL 48 03/10/2013   LDLCALC 33 03/10/2013   TRIG 53 03/10/2013   CHOLHDL 1.9 03/10/2013

## 2013-06-02 NOTE — Assessment & Plan Note (Signed)
Given recent fracture, will refer for bone density.

## 2013-06-05 DIAGNOSIS — I69959 Hemiplegia and hemiparesis following unspecified cerebrovascular disease affecting unspecified side: Secondary | ICD-10-CM | POA: Diagnosis not present

## 2013-06-05 DIAGNOSIS — I10 Essential (primary) hypertension: Secondary | ICD-10-CM | POA: Diagnosis not present

## 2013-06-05 DIAGNOSIS — M6281 Muscle weakness (generalized): Secondary | ICD-10-CM | POA: Diagnosis not present

## 2013-06-05 DIAGNOSIS — I4891 Unspecified atrial fibrillation: Secondary | ICD-10-CM | POA: Diagnosis not present

## 2013-06-05 DIAGNOSIS — IMO0001 Reserved for inherently not codable concepts without codable children: Secondary | ICD-10-CM | POA: Diagnosis not present

## 2013-06-05 NOTE — Telephone Encounter (Signed)
Spoke with [name] and she states she will discuss this with her sister, Kit who is a Marine scientist and will call us back with their decision.

## 2013-06-07 ENCOUNTER — Telehealth: Payer: Self-pay | Admitting: Family

## 2013-06-07 ENCOUNTER — Other Ambulatory Visit: Payer: Self-pay | Admitting: Physician Assistant

## 2013-06-07 DIAGNOSIS — I10 Essential (primary) hypertension: Secondary | ICD-10-CM | POA: Diagnosis not present

## 2013-06-07 DIAGNOSIS — M6281 Muscle weakness (generalized): Secondary | ICD-10-CM | POA: Diagnosis not present

## 2013-06-07 DIAGNOSIS — M8580 Other specified disorders of bone density and structure, unspecified site: Secondary | ICD-10-CM

## 2013-06-07 DIAGNOSIS — I69959 Hemiplegia and hemiparesis following unspecified cerebrovascular disease affecting unspecified side: Secondary | ICD-10-CM | POA: Diagnosis not present

## 2013-06-07 DIAGNOSIS — I4891 Unspecified atrial fibrillation: Secondary | ICD-10-CM | POA: Diagnosis not present

## 2013-06-07 DIAGNOSIS — IMO0001 Reserved for inherently not codable concepts without codable children: Secondary | ICD-10-CM | POA: Diagnosis not present

## 2013-06-07 NOTE — Addendum Note (Signed)
Addended by: Debbrah Alar on: 06/07/2013 01:59 PM   Modules accepted: Orders

## 2013-06-07 NOTE — Telephone Encounter (Signed)
Radiology states they will not take printed order it has be to linked in epic

## 2013-06-07 NOTE — Telephone Encounter (Signed)
For some reason I can not see that. It is not under the orders tab on patients appt desk. I did go ahead and sch appt. Nurse was able to print order out for me.

## 2013-06-07 NOTE — Telephone Encounter (Signed)
Can you please reenter order, it will not let me link the order to the appt. I'm sorry

## 2013-06-07 NOTE — Telephone Encounter (Signed)
Order was placed on 3/7 for [place].

## 2013-06-07 NOTE — Telephone Encounter (Signed)
Requesting order for dexa scan, request to be done @ [facility] on [address]

## 2013-06-07 NOTE — Telephone Encounter (Signed)
Reentered order 

## 2013-06-11 ENCOUNTER — Telehealth: Payer: Self-pay | Admitting: *Deleted

## 2013-06-12 ENCOUNTER — Ambulatory Visit (INDEPENDENT_AMBULATORY_CARE_PROVIDER_SITE_OTHER)
Admission: RE | Admit: 2013-06-12 | Discharge: 2013-06-12 | Disposition: A | Discharge status: Home / Self Care | Payer: Medicare Other | Source: Ambulatory Visit | Attending: Family | Admitting: Family

## 2013-06-12 DIAGNOSIS — M899 Disorder of bone, unspecified: Secondary | ICD-10-CM

## 2013-06-12 DIAGNOSIS — M8580 Other specified disorders of bone density and structure, unspecified site: Secondary | ICD-10-CM

## 2013-06-12 DIAGNOSIS — M949 Disorder of cartilage, unspecified: Secondary | ICD-10-CM | POA: Diagnosis not present

## 2013-06-12 NOTE — Addendum Note (Signed)
Addended by: Lolita Cram T on: 06/12/2013 11:40 AM   Modules accepted: Orders

## 2013-06-12 NOTE — Telephone Encounter (Signed)
done

## 2013-06-14 DIAGNOSIS — M6281 Muscle weakness (generalized): Secondary | ICD-10-CM | POA: Diagnosis not present

## 2013-06-14 DIAGNOSIS — I69959 Hemiplegia and hemiparesis following unspecified cerebrovascular disease affecting unspecified side: Secondary | ICD-10-CM | POA: Diagnosis not present

## 2013-06-14 DIAGNOSIS — IMO0001 Reserved for inherently not codable concepts without codable children: Secondary | ICD-10-CM | POA: Diagnosis not present

## 2013-06-14 DIAGNOSIS — I4891 Unspecified atrial fibrillation: Secondary | ICD-10-CM | POA: Diagnosis not present

## 2013-06-14 DIAGNOSIS — I10 Essential (primary) hypertension: Secondary | ICD-10-CM | POA: Diagnosis not present

## 2013-06-18 ENCOUNTER — Telehealth: Payer: Self-pay | Admitting: Family

## 2013-06-18 NOTE — Telephone Encounter (Signed)
Notified pt's daughter. She requests that we initiate insurance verification as they would like to know pt's out of pocket cost before they will know if they want to proceed.  Staff message sent to Ruby McClinton to initiate Prolia benefits.

## 2013-06-18 NOTE — Telephone Encounter (Signed)
Spoke with pt's family and she states they took pt and completed DEXA last week at the [place] office.

## 2013-06-18 NOTE — Telephone Encounter (Signed)
Please let pt know that her bone density test is showing bone thinning and increased risk for fracture. Continue calcium supplement. I know she cannot take fosamax.  I would recommend that she start prolia injections every 6 months.  If she is agreeable please initiate with insurance.

## 2013-06-21 DIAGNOSIS — I4891 Unspecified atrial fibrillation: Secondary | ICD-10-CM | POA: Diagnosis not present

## 2013-06-21 DIAGNOSIS — M6281 Muscle weakness (generalized): Secondary | ICD-10-CM | POA: Diagnosis not present

## 2013-06-21 DIAGNOSIS — IMO0001 Reserved for inherently not codable concepts without codable children: Secondary | ICD-10-CM | POA: Diagnosis not present

## 2013-06-21 DIAGNOSIS — I10 Essential (primary) hypertension: Secondary | ICD-10-CM | POA: Diagnosis not present

## 2013-06-21 DIAGNOSIS — I69959 Hemiplegia and hemiparesis following unspecified cerebrovascular disease affecting unspecified side: Secondary | ICD-10-CM | POA: Diagnosis not present

## 2013-06-22 ENCOUNTER — Ambulatory Visit: Payer: Medicare Other | Attending: Neurology | Admitting: Rehabilitative and Restorative Service Providers"

## 2013-06-22 ENCOUNTER — Ambulatory Visit: Payer: Medicare Other | Admitting: *Deleted

## 2013-06-22 ENCOUNTER — Other Ambulatory Visit: Payer: Self-pay | Admitting: Family

## 2013-06-22 DIAGNOSIS — R279 Unspecified lack of coordination: Secondary | ICD-10-CM | POA: Diagnosis not present

## 2013-06-22 DIAGNOSIS — R5381 Other malaise: Secondary | ICD-10-CM | POA: Diagnosis not present

## 2013-06-22 DIAGNOSIS — IMO0001 Reserved for inherently not codable concepts without codable children: Secondary | ICD-10-CM | POA: Diagnosis not present

## 2013-06-22 DIAGNOSIS — M6281 Muscle weakness (generalized): Secondary | ICD-10-CM | POA: Insufficient documentation

## 2013-06-22 NOTE — Telephone Encounter (Signed)
Was notified that [name] is no longer our contact for Prolia. Forwarded request to [employer].

## 2013-06-25 DIAGNOSIS — I4892 Unspecified atrial flutter: Secondary | ICD-10-CM | POA: Diagnosis not present

## 2013-06-25 DIAGNOSIS — Z7901 Long term (current) use of anticoagulants: Secondary | ICD-10-CM | POA: Diagnosis not present

## 2013-06-25 DIAGNOSIS — Z8673 Personal history of transient ischemic attack (TIA), and cerebral infarction without residual deficits: Secondary | ICD-10-CM | POA: Diagnosis not present

## 2013-06-25 DIAGNOSIS — I951 Orthostatic hypotension: Secondary | ICD-10-CM | POA: Diagnosis not present

## 2013-06-26 ENCOUNTER — Ambulatory Visit: Payer: Medicare Other | Admitting: Rehabilitative and Restorative Service Providers"

## 2013-06-27 ENCOUNTER — Ambulatory Visit: Payer: Medicare Other | Attending: Neurology | Admitting: *Deleted

## 2013-06-27 DIAGNOSIS — IMO0001 Reserved for inherently not codable concepts without codable children: Secondary | ICD-10-CM | POA: Insufficient documentation

## 2013-06-27 DIAGNOSIS — R5381 Other malaise: Secondary | ICD-10-CM | POA: Insufficient documentation

## 2013-06-27 DIAGNOSIS — M6281 Muscle weakness (generalized): Secondary | ICD-10-CM | POA: Diagnosis not present

## 2013-06-27 DIAGNOSIS — R279 Unspecified lack of coordination: Secondary | ICD-10-CM | POA: Insufficient documentation

## 2013-07-04 ENCOUNTER — Ambulatory Visit: Payer: Medicare Other

## 2013-07-04 ENCOUNTER — Ambulatory Visit: Payer: Medicare Other | Admitting: Rehabilitative and Restorative Service Providers"

## 2013-07-06 ENCOUNTER — Ambulatory Visit: Payer: Medicare Other | Admitting: Physical Therapy

## 2013-07-06 ENCOUNTER — Ambulatory Visit: Payer: Medicare Other | Admitting: Occupational Therapy

## 2013-07-09 ENCOUNTER — Ambulatory Visit: Payer: Medicare Other | Admitting: Rehabilitative and Restorative Service Providers"

## 2013-07-09 ENCOUNTER — Ambulatory Visit: Payer: Medicare Other | Admitting: *Deleted

## 2013-07-13 ENCOUNTER — Ambulatory Visit: Payer: Medicare Other | Admitting: Physical Therapy

## 2013-07-13 ENCOUNTER — Ambulatory Visit: Payer: Medicare Other | Admitting: *Deleted

## 2013-07-18 ENCOUNTER — Ambulatory Visit: Payer: Medicare Other | Admitting: *Deleted

## 2013-07-18 ENCOUNTER — Encounter: Payer: Self-pay | Admitting: Family

## 2013-07-18 ENCOUNTER — Ambulatory Visit: Payer: Medicare Other | Admitting: Nurse Practitioner

## 2013-07-18 ENCOUNTER — Ambulatory Visit: Payer: Medicare Other | Admitting: Physical Therapy

## 2013-07-20 ENCOUNTER — Ambulatory Visit: Payer: Medicare Other | Admitting: *Deleted

## 2013-07-20 ENCOUNTER — Ambulatory Visit: Payer: Medicare Other | Admitting: Physical Therapy

## 2013-07-20 NOTE — Telephone Encounter (Signed)
See 07/18/13 mychart message.  America Brown Ronny Flurry, CMA            Ms. Axtell has a secondary insurance so that will pick up her deductible if she hasn't met it already. This means the patient's Prolia is covered at 100%.   I will fax future responses to you :)   Hope this helps!      Previous Messages      ----- Message -----  From: America Brown  Sent: 07/02/2013 10:45 AM  To: Ronny Flurry, CMA  Subject: RE: Nicholes Stairs,   Unfortunately, the response does not give me the amount of the deductible nor how much the Ms. Gearing has met. Our Prolia rep is supposed to be calling me back this afternoon and I will see if he can let me know that info.   Thank you!  Rose  ----- Message -----  From: Ronny Flurry, CMA  Sent: 07/02/2013 10:35 AM  To: America Brown  Subject: RE: [place]   I can let the pt know her deductible when I call her to schedule the appt if you can tell me what portion of her deductible she has met and what her total deductible is?   Thanks,  Gilmore Laroche   ----- Message -----  From: America Brown  Sent: 06/28/2013 9:37 AM  To: Ronny Flurry, CMA  Subject: Edgardo Roys morning again Gilmore Laroche!   I have just received the [place] verification approval for Ms. Olen Pel. According to the eligibility check results, for the injection Ms. Looney has a $0 co-pay plus any applicable deductible. Her injection can be scheduled anytime you are ready to schedule it.   If you want me to call her to let her know her co-pay, I can. Some CMA's let patients know when they call to schedule injections and some ask me to do it. I'll do whatever is easiest for you :)   Thank you!  Kalman Shan

## 2013-07-25 ENCOUNTER — Ambulatory Visit: Payer: Medicare Other | Admitting: *Deleted

## 2013-07-25 ENCOUNTER — Ambulatory Visit: Payer: Medicare Other | Admitting: Rehabilitative and Restorative Service Providers"

## 2013-07-30 ENCOUNTER — Telehealth: Payer: Self-pay | Admitting: Neurology

## 2013-07-30 NOTE — Telephone Encounter (Signed)
Kim pt's daughter is calling back concerning pt and would like a call from Dr or nurse concerning what they need to do or recommend. Pt is getting weak and can't do any walking at all. Please see previous note from [place] from this morning. Thanks

## 2013-07-30 NOTE — Telephone Encounter (Signed)
Over the weekend her mother has been experiencing more weakness. States she has been holding her left arm drawn up like it is in a sling until someone points it out to her. Hasn't fallen but she has had someone around her but her left leg keeps going out on her and her thigh area feels numb. Feels the patient is not mentally at the level she was at.

## 2013-07-31 ENCOUNTER — Emergency Department (HOSPITAL_BASED_OUTPATIENT_CLINIC_OR_DEPARTMENT_OTHER): Payer: Medicare Other

## 2013-07-31 ENCOUNTER — Emergency Department (HOSPITAL_BASED_OUTPATIENT_CLINIC_OR_DEPARTMENT_OTHER)
Admission: EM | Admit: 2013-07-31 | Discharge: 2013-07-31 | Disposition: A | Discharge status: Home / Self Care | Payer: Medicare Other | Attending: Emergency Medicine | Admitting: Emergency Medicine

## 2013-07-31 ENCOUNTER — Encounter (HOSPITAL_BASED_OUTPATIENT_CLINIC_OR_DEPARTMENT_OTHER): Payer: Self-pay | Admitting: Emergency Medicine

## 2013-07-31 DIAGNOSIS — R5381 Other malaise: Secondary | ICD-10-CM | POA: Diagnosis not present

## 2013-07-31 DIAGNOSIS — Z862 Personal history of diseases of the blood and blood-forming organs and certain disorders involving the immune mechanism: Secondary | ICD-10-CM | POA: Insufficient documentation

## 2013-07-31 DIAGNOSIS — R5383 Other fatigue: Secondary | ICD-10-CM | POA: Diagnosis not present

## 2013-07-31 DIAGNOSIS — R531 Weakness: Secondary | ICD-10-CM

## 2013-07-31 DIAGNOSIS — N390 Urinary tract infection, site not specified: Secondary | ICD-10-CM

## 2013-07-31 DIAGNOSIS — I4891 Unspecified atrial fibrillation: Secondary | ICD-10-CM | POA: Diagnosis not present

## 2013-07-31 DIAGNOSIS — Z7901 Long term (current) use of anticoagulants: Secondary | ICD-10-CM | POA: Insufficient documentation

## 2013-07-31 DIAGNOSIS — I4892 Unspecified atrial flutter: Secondary | ICD-10-CM | POA: Diagnosis not present

## 2013-07-31 DIAGNOSIS — Z79899 Other long term (current) drug therapy: Secondary | ICD-10-CM | POA: Insufficient documentation

## 2013-07-31 DIAGNOSIS — Z8673 Personal history of transient ischemic attack (TIA), and cerebral infarction without residual deficits: Secondary | ICD-10-CM | POA: Insufficient documentation

## 2013-07-31 DIAGNOSIS — G43909 Migraine, unspecified, not intractable, without status migrainosus: Secondary | ICD-10-CM | POA: Diagnosis not present

## 2013-07-31 DIAGNOSIS — Z8639 Personal history of other endocrine, nutritional and metabolic disease: Secondary | ICD-10-CM | POA: Insufficient documentation

## 2013-07-31 DIAGNOSIS — M129 Arthropathy, unspecified: Secondary | ICD-10-CM | POA: Insufficient documentation

## 2013-07-31 DIAGNOSIS — Z860100 Personal history of colon polyps, unspecified: Secondary | ICD-10-CM | POA: Insufficient documentation

## 2013-07-31 DIAGNOSIS — Z87891 Personal history of nicotine dependence: Secondary | ICD-10-CM | POA: Diagnosis not present

## 2013-07-31 DIAGNOSIS — R29818 Other symptoms and signs involving the nervous system: Secondary | ICD-10-CM | POA: Diagnosis not present

## 2013-07-31 DIAGNOSIS — I10 Essential (primary) hypertension: Secondary | ICD-10-CM | POA: Insufficient documentation

## 2013-07-31 DIAGNOSIS — G40909 Epilepsy, unspecified, not intractable, without status epilepticus: Secondary | ICD-10-CM | POA: Diagnosis not present

## 2013-07-31 DIAGNOSIS — Z8719 Personal history of other diseases of the digestive system: Secondary | ICD-10-CM | POA: Insufficient documentation

## 2013-07-31 DIAGNOSIS — R51 Headache: Secondary | ICD-10-CM | POA: Diagnosis not present

## 2013-07-31 HISTORY — DX: Unspecified atrial fibrillation: I48.91

## 2013-07-31 LAB — CBC WITH DIFFERENTIAL/PLATELET
Basophils Absolute: 0 10*3/uL (ref 0.0–0.1)
Basophils Relative: 1 % (ref 0–1)
EOS ABS: 0.1 10*3/uL (ref 0.0–0.7)
EOS PCT: 2 % (ref 0–5)
HCT: 41.1 % (ref 36.0–46.0)
HEMOGLOBIN: 13.6 g/dL (ref 12.0–15.0)
LYMPHS ABS: 0.9 10*3/uL (ref 0.7–4.0)
Lymphocytes Relative: 16 % (ref 12–46)
MCH: 31.7 pg (ref 26.0–34.0)
MCHC: 33.1 g/dL (ref 30.0–36.0)
MCV: 95.8 fL (ref 78.0–100.0)
MONOS PCT: 15 % — AB (ref 3–12)
Monocytes Absolute: 0.8 10*3/uL (ref 0.1–1.0)
NEUTROS PCT: 66 % (ref 43–77)
Neutro Abs: 3.9 10*3/uL (ref 1.7–7.7)
Platelets: 160 10*3/uL (ref 150–400)
RBC: 4.29 MIL/uL (ref 3.87–5.11)
RDW: 15.4 % (ref 11.5–15.5)
WBC: 5.7 10*3/uL (ref 4.0–10.5)

## 2013-07-31 LAB — COMPREHENSIVE METABOLIC PANEL
ALK PHOS: 57 U/L (ref 39–117)
ALT: 14 U/L (ref 0–35)
AST: 25 U/L (ref 0–37)
Albumin: 3.4 g/dL — ABNORMAL LOW (ref 3.5–5.2)
BUN: 17 mg/dL (ref 6–23)
CO2: 30 meq/L (ref 19–32)
Calcium: 9.4 mg/dL (ref 8.4–10.5)
Chloride: 104 mEq/L (ref 96–112)
Creatinine, Ser: 0.9 mg/dL (ref 0.50–1.10)
GFR, EST AFRICAN AMERICAN: 65 mL/min — AB (ref >90)
GFR, EST NON AFRICAN AMERICAN: 56 mL/min — AB (ref >90)
GLUCOSE: 154 mg/dL — AB (ref 70–99)
POTASSIUM: 4.2 meq/L (ref 3.7–5.3)
Sodium: 145 mEq/L (ref 137–147)
Total Bilirubin: 0.4 mg/dL (ref 0.3–1.2)
Total Protein: 6.6 g/dL (ref 6.0–8.3)

## 2013-07-31 LAB — URINALYSIS, ROUTINE W REFLEX MICROSCOPIC
BILIRUBIN URINE: NEGATIVE
Glucose, UA: NEGATIVE mg/dL
HGB URINE DIPSTICK: NEGATIVE
KETONES UR: 15 mg/dL — AB
NITRITE: NEGATIVE
PROTEIN: NEGATIVE mg/dL
SPECIFIC GRAVITY, URINE: 1.022 (ref 1.005–1.030)
Urobilinogen, UA: 0.2 mg/dL (ref 0.0–1.0)
pH: 6 (ref 5.0–8.0)

## 2013-07-31 LAB — URINE MICROSCOPIC-ADD ON

## 2013-07-31 LAB — TROPONIN I

## 2013-07-31 MED ORDER — CIPROFLOXACIN HCL 500 MG PO TABS: 500.0000 mg | ORAL_TABLET | Freq: Two times a day (BID) | ORAL | Status: DC

## 2013-07-31 MED ORDER — SODIUM CHLORIDE 0.9 % IV BOLUS (SEPSIS)
500.0000 mL | Freq: Once | INTRAVENOUS | Status: AC
  Administered 2013-07-31: 500 mL via INTRAVENOUS

## 2013-07-31 NOTE — Discharge Instructions (Signed)
Cipro as prescribed.  Return to the emergency department if your symptoms substantially worsen or change.   Urinary Tract Infection Urinary tract infections (UTIs) can develop anywhere along your urinary tract. Your urinary tract is your body's drainage system for removing wastes and extra water. Your urinary tract includes two kidneys, two ureters, a bladder, and a urethra. Your kidneys are a pair of bean-shaped organs. Each kidney is about the size of your fist. They are located below your ribs, one on each side of your spine. CAUSES Infections are caused by microbes, which are microscopic organisms, including fungi, viruses, and bacteria. These organisms are so small that they can only be seen through a microscope. Bacteria are the microbes that most commonly cause UTIs. SYMPTOMS  Symptoms of UTIs may vary by age and gender of the patient and by the location of the infection. Symptoms in young women typically include a frequent and intense urge to urinate and a painful, burning feeling in the bladder or urethra during urination. Older women and men are more likely to be tired, shaky, and weak and have muscle aches and abdominal pain. A fever may mean the infection is in your kidneys. Other symptoms of a kidney infection include pain in your back or sides below the ribs, nausea, and vomiting. DIAGNOSIS To diagnose a UTI, your caregiver will ask you about your symptoms. Your caregiver also will ask to provide a urine sample. The urine sample will be tested for bacteria and white blood cells. White blood cells are made by your body to help fight infection. TREATMENT  Typically, UTIs can be treated with medication. Because most UTIs are caused by a bacterial infection, they usually can be treated with the use of antibiotics. The choice of antibiotic and length of treatment depend on your symptoms and the type of bacteria causing your infection. HOME CARE INSTRUCTIONS  If you were prescribed  antibiotics, take them exactly as your caregiver instructs you. Finish the medication even if you feel better after you have only taken some of the medication.  Drink enough water and fluids to keep your urine clear or pale yellow.  Avoid caffeine, tea, and carbonated beverages. They tend to irritate your bladder.  Empty your bladder often. Avoid holding urine for long periods of time.  Empty your bladder before and after sexual intercourse.  After a bowel movement, women should cleanse from front to back. Use each tissue only once. SEEK MEDICAL CARE IF:   You have back pain.  You develop a fever.  Your symptoms do not begin to resolve within 3 days. SEEK IMMEDIATE MEDICAL CARE IF:   You have severe back pain or lower abdominal pain.  You develop chills.  You have nausea or vomiting.  You have continued burning or discomfort with urination. MAKE SURE YOU:   Understand these instructions.  Will watch your condition.  Will get help right away if you are not doing well or get worse. Document Released: 12/23/2004 Document Revised: 09/14/2011 Document Reviewed: 04/23/2011 [facility] Patient Information 2014 [place].

## 2013-07-31 NOTE — Telephone Encounter (Signed)
Patient Information:  Caller Name: [name]  Phone: [phone]  Patient: [name], [name]  Gender: Female  DOB: 1924/08/19  Age: 80 Years  PCP: Debbrah Alar (Adults only)  Office Follow Up:  Does the office need to follow up with this patient?: No  Instructions For The Office: N/A   Symptoms  Reason For Call & Symptoms: Calling about progressive confusion, weakness and decreased alertness over the weekend- onset 07/28/13. L leg and L arm weaker and aching on and off  all weekend. She is sleeping more and disoriented when she wakes up. Requiring 2 person assist for ambulation- she was walking on her own on 07/27/13.  Reviewed Health History In EMR: Yes  Reviewed Medications In EMR: Yes  Reviewed Allergies In EMR: Yes  Reviewed Surgeries / Procedures: Yes  Date of Onset of Symptoms: 07/28/2013  Guideline(s) Used:  Weakness (Generalized) and Fatigue  Disposition Per Guideline:   Call EMS 911 Now  Reason For Disposition Reached:   Difficult to awaken or acting confused (e.g., disoriented, slurred speech)  Advice Given:  Call Back If:  Unable to stand or walk  Passes out  Breathing difficulty occurs  You become worse.  Patient Will Follow Care Advice:  YES

## 2013-07-31 NOTE — ED Provider Notes (Signed)
CSN: [id number]     Arrival date & time 07/31/13  1056 History   First MD Initiated Contact with Patient 07/31/13 1113     Chief Complaint  Patient presents with  . generalized weakness since saturday      (Consider location/radiation/quality/duration/timing/severity/associated sxs/prior Treatment) HPI Comments: Patient is a 80 year old female with past medical history of CVA and atrial fibrillation. She is brought for evaluation of generalized weakness and increased weakness of the left arm and leg for the past several days. Patient experienced a stroke in May of last year and has been left with a mild left-sided hemiparesis. She was in rehabilitation and continues outpatient therapy for stroke. She was doing well up until this past weekend when she became weaker. The family noticed increased weakness of the left arm and leg and she does not have energy to do what she has normally been able to do. The patient denies any pain, fever, or any other symptom.  Patient lives at home with her 3 daughters who are her caretakers.  Patient is a 80 y.o. female presenting with weakness. The history is provided by the patient.  Weakness This is a new problem. Episode onset: 3 days ago. The problem occurs constantly. The problem has been gradually worsening. Pertinent negatives include no chest pain, no abdominal pain, no headaches and no shortness of breath. Nothing aggravates the symptoms. Nothing relieves the symptoms. She has tried nothing for the symptoms. The treatment provided no relief.    Past Medical History  Diagnosis Date  . Arthritis   . Hypertension   . Migraine   . Colon polyp   . Hearing difficulty   . Torus palatinus   . Atrial flutter January, 2012  . Stroke 08/02/12     right lenticular nucleus infarct  . Left leg weakness 02/05/2013  . Chronic anticoagulation   . High cholesterol   . Atrial fibrillation    Past Surgical History  Procedure Laterality Date  . Cataract  extraction w/ intraocular lens  implant, bilateral  2006-2008  . Ganglion cyst excision Bilateral 1938,1954,2003,2005    "wrists/hand" (08/01/2012)  . Cardioversion  05/19/2010    Dr. Einar Gip  . Tonsillectomy  ~ 1935  . Appendectomy  02/19/53    `  . Mouth surgery      [name]   Family History  Problem Relation Age of Onset  . Colon cancer    . Breast cancer    . Brain cancer    . Cancer Mother     breast  . Aneurysm Mother     brain  . Heart attack Neg Hx   . Diabetes Neg Hx   . Hypertension Neg Hx    History  Substance Use Topics  . Smoking status: Former Research scientist (life sciences)  . Smokeless tobacco: Never Used  . Alcohol Use: 0.0 oz/week     Comment: 08/01/2012 "glass of wine on special occasions"   OB History   Grav Para Term Preterm Abortions TAB SAB Ect Mult Living                 Review of Systems  Constitutional: Positive for fatigue.  Respiratory: Negative for shortness of breath.   Cardiovascular: Negative for chest pain.  Gastrointestinal: Negative for abdominal pain.  Neurological: Positive for weakness. Negative for headaches.  All other systems reviewed and are negative.     Allergies  Tramadol hcl and Alendronate sodium  Home Medications   Prior to Admission medications   Medication Sig Start  Date End Date Taking? Authorizing Provider  aspirin-acetaminophen-caffeine (EXCEDRIN MIGRAINE) [phone] MG per tablet Take 2 tablets by mouth daily as needed for pain (for migraines).    Historical Provider, MD  Calcium-Magnesium-Vitamin D (CALCIUM 500 PO) Take 1 tablet by mouth 2 (two) times daily.    Historical Provider, MD  cholecalciferol (VITAMIN D) 1000 UNITS tablet Take 2,000 Units by mouth daily.    Historical Provider, MD  divalproex (DEPAKOTE) 125 MG DR tablet TAKE 3 TABLETS (375 MG TOTAL) BY MOUTH 2 TIMES DAILY. 06/22/13   Debbrah Alar, NP  docusate sodium (COLACE) 100 MG capsule Take 100 mg by mouth 2 (two) times daily.     Historical Provider, MD  Multiple  Vitamin (MULTIVITAMIN WITH MINERALS) TABS Take 1 tablet by mouth daily.    Historical Provider, MD  pantoprazole (PROTONIX) 40 MG tablet Take 1 tablet (40 mg total) by mouth daily. 05/04/13   Debbrah Alar, NP  Rivaroxaban (XARELTO) 15 MG TABS tablet Take 1 tablet (15 mg total) by mouth daily with supper. 04/16/13   Debbrah Alar, NP  sodium chloride (OCEAN) 0.65 % SOLN nasal spray Place 1 spray into both nostrils as needed for congestion. 03/02/13   Daniel J Angiulli, PA-C   BP 107/64  Pulse 90  Temp(Src) 97.9 F (36.6 C)  Resp 20  Wt 116 lb (52.617 kg)  SpO2 97% Physical Exam  Nursing note and vitals reviewed. Constitutional: She is oriented to person, place, and time. No distress.  Patient is an elderly female who is somewhat thin and cachectic.  HENT:  Head: Normocephalic and atraumatic.  Eyes: EOM are normal. Pupils are equal, round, and reactive to light.  Neck: Normal range of motion. Neck supple.  Cardiovascular: Normal rate and regular rhythm.  Exam reveals no gallop and no friction rub.   No murmur heard. Pulmonary/Chest: Effort normal and breath sounds normal. No respiratory distress. She has no wheezes.  Abdominal: Soft. Bowel sounds are normal. She exhibits no distension. There is no tenderness.  Musculoskeletal: Normal range of motion.  Neurological: She is alert and oriented to person, place, and time. No cranial nerve deficit. She exhibits abnormal muscle tone. Coordination normal.  She is noted to have 4+ out of 5 strength in the right upper and right lower extremity. The left upper and left lower extremity have 3+ strength.  Skin: Skin is warm and dry. She is not diaphoretic.    ED Course  Procedures (including critical care time) Labs Review Labs Reviewed  CBC WITH DIFFERENTIAL  COMPREHENSIVE METABOLIC PANEL  TROPONIN I  URINALYSIS, ROUTINE W REFLEX MICROSCOPIC    Imaging Review No results found.   EKG Interpretation   Date/Time:  Tuesday Jul 31 2013 11:54:33 EDT Ventricular Rate:  80 PR Interval:    QRS Duration: 76 QT Interval:  378 QTC Calculation: 435 R Axis:   48 Text Interpretation:  Atrial flutter Septal infarct , age undetermined  Abnormal ECG Confirmed by DELOS  MD, Sicilia Killough ([id number]) on 07/31/2013 1:14:17  PM      MDM   Final diagnoses:  None    Patient is an 80 year old female who presents with complaints of weakness. This is been going on for the past several days. She has no specific complaints and denies that anything hurts or bothers her. Workup reveals no elevation of white count, normal blood counts, unremarkable electrolytes. Imaging studies reveal no evidence for acute stroke or pneumonia. Her EKG shows an atrial flutter which is unchanged from multiple prior  EKGs. Her urine does reveal urinary tract infection which will be treated with Cipro. I feel as though she is appropriate for discharge, to followup as needed if symptoms worsen or do not improve.    Veryl Speak, MD 07/31/13 1315

## 2013-07-31 NOTE — Telephone Encounter (Signed)
I called and spoke to [name].  She is taking her mother to [facility] ED vs [place] via EMS.  Pt worsening over the weekend, decreased cognition, weakness, pain.  Agree with ED.  I would forward to Dr. Leonie Man.

## 2013-07-31 NOTE — ED Notes (Signed)
Pt lives at home with daughter who states patient has been having generalized weakness that seems to be progressing to the family. Has history of stroke and numerous tia pt is awake and alert verbal on arrival

## 2013-08-01 ENCOUNTER — Encounter: Payer: Medicare Other | Admitting: *Deleted

## 2013-08-01 ENCOUNTER — Ambulatory Visit: Payer: Medicare Other | Admitting: Rehabilitative and Restorative Service Providers"

## 2013-08-03 ENCOUNTER — Ambulatory Visit: Payer: Medicare Other | Admitting: Physical Therapy

## 2013-08-03 ENCOUNTER — Ambulatory Visit: Payer: Medicare Other | Attending: Neurology | Admitting: *Deleted

## 2013-08-03 DIAGNOSIS — R279 Unspecified lack of coordination: Secondary | ICD-10-CM | POA: Diagnosis not present

## 2013-08-03 DIAGNOSIS — IMO0001 Reserved for inherently not codable concepts without codable children: Secondary | ICD-10-CM | POA: Diagnosis not present

## 2013-08-03 DIAGNOSIS — M6281 Muscle weakness (generalized): Secondary | ICD-10-CM | POA: Insufficient documentation

## 2013-08-03 DIAGNOSIS — R5381 Other malaise: Secondary | ICD-10-CM | POA: Insufficient documentation

## 2013-08-06 ENCOUNTER — Encounter: Payer: Self-pay | Admitting: Family

## 2013-08-07 ENCOUNTER — Encounter: Payer: Self-pay | Admitting: Family

## 2013-08-07 ENCOUNTER — Ambulatory Visit (INDEPENDENT_AMBULATORY_CARE_PROVIDER_SITE_OTHER): Payer: Medicare Other | Admitting: Family

## 2013-08-07 VITALS — BP 118/88 | HR 77 | Temp 97.4°F | Resp 16 | Ht 67.0 in | Wt 118.0 lb

## 2013-08-07 DIAGNOSIS — M6281 Muscle weakness (generalized): Secondary | ICD-10-CM | POA: Diagnosis not present

## 2013-08-07 DIAGNOSIS — R29898 Other symptoms and signs involving the musculoskeletal system: Secondary | ICD-10-CM

## 2013-08-07 DIAGNOSIS — N390 Urinary tract infection, site not specified: Secondary | ICD-10-CM

## 2013-08-07 DIAGNOSIS — R531 Weakness: Secondary | ICD-10-CM

## 2013-08-07 NOTE — Assessment & Plan Note (Signed)
No GU symptoms.  Will culture to ensure resolution.

## 2013-08-07 NOTE — Patient Instructions (Signed)
You will be contacted about your MRI. Call if you develop increased confusion or weakness.  Follow up in 1 month.

## 2013-08-07 NOTE — Progress Notes (Signed)
Pre visit review using our clinic review tool, if applicable. No additional management support is needed unless otherwise documented below in the visit note. 

## 2013-08-07 NOTE — Progress Notes (Signed)
Subjective:    Patient ID: [name], female    DOB: 01-Oct-1924, 80 y.o.   MRN: [id number]  HPI  [name] is an 80 yr old female with hx of CVA 2014 and residual left sided weakness who presents today for ED follow up.  The patient was seen on 07/31/13 with chief complaint ow weakness. She was noted during that time to be in atrial flutter which is not new for her. She is on xarelto.  UA was suggestive or UTI and she was placed on cipro. CT head noted atrophy and chronic microvascular ischemia.  Urine culture was not sent at that time. Glucose was noted to be mildly elevated which is not new for her.  Notes intermittent left leg intermittent weakness. She reports some low back pain and tightness in her lower back which she is addressing through PT. She has hx of spinal stenosis which was documented on MRI of the lumbar spine in November 2014.   Review of Systems    see HPI  Past Medical History  Diagnosis Date  . Arthritis   . Hypertension   . Migraine   . Colon polyp   . Hearing difficulty   . Torus palatinus   . Atrial flutter January, 2012  . Stroke 08/02/12     right lenticular nucleus infarct  . Left leg weakness 02/05/2013  . Chronic anticoagulation   . High cholesterol   . Atrial fibrillation     History   Social History  . Marital Status: Widowed    Spouse Name: N/A    Number of Children: 3  . Years of Education: college   Occupational History  . editorial work for [employer]    Social History Main Topics  . Smoking status: Former Research scientist (life sciences)  . Smokeless tobacco: Never Used  . Alcohol Use: 0.0 oz/week     Comment: 08/01/2012 "glass of wine on special occasions"  . Drug Use: No  . Sexual Activity: No   Other Topics Concern  . Not on file   Social History Narrative   Patient lives at home with daughters.   Caffeine use: 1/2-1 cup of coffee occasionally    Past Surgical History  Procedure Laterality Date  . Cataract extraction w/ intraocular lens   implant, bilateral  2006-2008  . Ganglion cyst excision Bilateral 1938,1954,2003,2005    "wrists/hand" (08/01/2012)  . Cardioversion  05/19/2010    Dr. Einar Gip  . Tonsillectomy  ~ 1935  . Appendectomy  02/19/53    `  . Mouth surgery      [name]    Family History  Problem Relation Age of Onset  . Colon cancer    . Breast cancer    . Brain cancer    . Cancer Mother     breast  . Aneurysm Mother     brain  . Heart attack Neg Hx   . Diabetes Neg Hx   . Hypertension Neg Hx     Allergies  Allergen Reactions  . Tramadol Hcl Other (See Comments)     lethargy, nausea  . Alendronate Sodium Rash and Other (See Comments)    puffy eyes    Current Outpatient Prescriptions on File Prior to Visit  Medication Sig Dispense Refill  . aspirin-acetaminophen-caffeine (EXCEDRIN MIGRAINE) 250-250-65 MG per tablet Take 2 tablets by mouth daily as needed for pain (for migraines).      . Calcium-Magnesium-Vitamin D (CALCIUM 500 PO) Take 1 tablet by mouth 2 (two) times daily.      Marland Kitchen  cholecalciferol (VITAMIN D) 1000 UNITS tablet Take 2,000 Units by mouth daily.      . divalproex (DEPAKOTE) 125 MG DR tablet TAKE 3 TABLETS (375 MG TOTAL) BY MOUTH 2 TIMES DAILY.  180 tablet  2  . Multiple Vitamin (MULTIVITAMIN WITH MINERALS) TABS Take 1 tablet by mouth daily.      . Rivaroxaban (XARELTO) 15 MG TABS tablet Take 1 tablet (15 mg total) by mouth daily with supper.  30 tablet  5  . sodium chloride (OCEAN) 0.65 % SOLN nasal spray Place 1 spray into both nostrils as needed for congestion.  1 Bottle  0   No current facility-administered medications on file prior to visit.    BP 118/88  Pulse 77  Temp(Src) 97.4 F (36.3 C) (Axillary)  Resp 16  Ht 5\' 7"  (1.702 m)  Wt 118 lb (53.524 kg)  BMI 18.48 kg/m2  SpO2 96%    Objective:   Physical Exam  Constitutional: She is oriented to person, place, and time. She appears well-developed and well-nourished. No distress.  HENT:  Head: Normocephalic and  atraumatic.  Cardiovascular: Normal rate.  An irregular rhythm present.  Abdominal: Soft. Bowel sounds are normal. She exhibits no distension and no mass. There is no tenderness. There is no rebound and no guarding.  Neurological: She is alert and oriented to person, place, and time.  LLE strength is 4-5/5.  Bilat UE strength is 5/5, RLE strength is 5/5  Skin: Skin is warm and dry.  Psychiatric: She has a normal mood and affect. Her behavior is normal. Judgment and thought content normal.          Assessment & Plan:

## 2013-08-07 NOTE — Assessment & Plan Note (Signed)
Acute on chronic.  Suspect that recent UTI exacerbated underlying deficit.  Will obtain MRI of the brain at family request. We did discuss that her spinal stenosis could be contributing to her LE weakness as well. Pt and daughters do not wish to pursue neurosurgical evaluation or further work up of this at this time which I think is reasonable given pt's advanced age.

## 2013-08-08 ENCOUNTER — Ambulatory Visit: Payer: Medicare Other | Admitting: Occupational Therapy

## 2013-08-08 ENCOUNTER — Ambulatory Visit: Payer: Medicare Other | Admitting: Physical Therapy

## 2013-08-08 DIAGNOSIS — R279 Unspecified lack of coordination: Secondary | ICD-10-CM | POA: Diagnosis not present

## 2013-08-08 DIAGNOSIS — IMO0001 Reserved for inherently not codable concepts without codable children: Secondary | ICD-10-CM | POA: Diagnosis not present

## 2013-08-08 DIAGNOSIS — R5381 Other malaise: Secondary | ICD-10-CM | POA: Diagnosis not present

## 2013-08-08 DIAGNOSIS — M6281 Muscle weakness (generalized): Secondary | ICD-10-CM | POA: Diagnosis not present

## 2013-08-08 LAB — URINE CULTURE
COLONY COUNT: NO GROWTH
ORGANISM ID, BACTERIA: NO GROWTH

## 2013-08-10 ENCOUNTER — Encounter: Payer: Self-pay | Admitting: Family

## 2013-08-10 ENCOUNTER — Ambulatory Visit: Payer: Medicare Other | Admitting: Rehabilitative and Restorative Service Providers"

## 2013-08-10 ENCOUNTER — Ambulatory Visit: Payer: Medicare Other | Admitting: Occupational Therapy

## 2013-08-10 DIAGNOSIS — IMO0001 Reserved for inherently not codable concepts without codable children: Secondary | ICD-10-CM | POA: Diagnosis not present

## 2013-08-10 DIAGNOSIS — R5381 Other malaise: Secondary | ICD-10-CM | POA: Diagnosis not present

## 2013-08-10 DIAGNOSIS — R279 Unspecified lack of coordination: Secondary | ICD-10-CM | POA: Diagnosis not present

## 2013-08-10 DIAGNOSIS — M6281 Muscle weakness (generalized): Secondary | ICD-10-CM | POA: Diagnosis not present

## 2013-08-11 ENCOUNTER — Ambulatory Visit (HOSPITAL_BASED_OUTPATIENT_CLINIC_OR_DEPARTMENT_OTHER)
Admission: RE | Admit: 2013-08-11 | Discharge: 2013-08-11 | Disposition: A | Discharge status: Home / Self Care | Payer: Medicare Other | Source: Ambulatory Visit | Attending: Family | Admitting: Family

## 2013-08-11 DIAGNOSIS — I6789 Other cerebrovascular disease: Secondary | ICD-10-CM | POA: Diagnosis not present

## 2013-08-11 DIAGNOSIS — G319 Degenerative disease of nervous system, unspecified: Secondary | ICD-10-CM | POA: Diagnosis not present

## 2013-08-11 DIAGNOSIS — I6350 Cerebral infarction due to unspecified occlusion or stenosis of unspecified cerebral artery: Secondary | ICD-10-CM | POA: Insufficient documentation

## 2013-08-11 DIAGNOSIS — R29898 Other symptoms and signs involving the musculoskeletal system: Secondary | ICD-10-CM

## 2013-08-11 DIAGNOSIS — F29 Unspecified psychosis not due to a substance or known physiological condition: Secondary | ICD-10-CM | POA: Diagnosis not present

## 2013-08-12 ENCOUNTER — Telehealth: Payer: Self-pay | Admitting: Family

## 2013-08-12 ENCOUNTER — Encounter: Payer: Self-pay | Admitting: Family

## 2013-08-12 NOTE — Telephone Encounter (Signed)
Reviewed MRI of the brain- notes acute infarct involving the right corpus callosum and right frontal white matter. She is on xarelto.  Had echo and carotids performed 11/14.  Sees Neurology. Already continuing PT.  Spoke with daughter [name] on phone this AM- reviewed findings with her. Advised her that I am trying to arrange sooner follow up with neurology and to bring the patient to ER if she has any acute worsening of symptoms such as weakness/slurred speech etc.  She verbalizes understanding.

## 2013-08-12 NOTE — Telephone Encounter (Signed)
Dr. Leonie Man, this patient has suffered another stroke per MRI results obtained this weekend.  She is scheduled for follow up with you on 6/24.  Would you or Je[facility] Hawking be able to see her this week please due to recurrent CVA?  Thank you.

## 2013-08-13 ENCOUNTER — Ambulatory Visit: Payer: Medicare Other | Admitting: Rehabilitative and Restorative Service Providers"

## 2013-08-13 ENCOUNTER — Ambulatory Visit: Payer: Medicare Other | Admitting: Occupational Therapy

## 2013-08-13 DIAGNOSIS — IMO0001 Reserved for inherently not codable concepts without codable children: Secondary | ICD-10-CM | POA: Diagnosis not present

## 2013-08-13 DIAGNOSIS — M6281 Muscle weakness (generalized): Secondary | ICD-10-CM | POA: Diagnosis not present

## 2013-08-13 DIAGNOSIS — R5381 Other malaise: Secondary | ICD-10-CM | POA: Diagnosis not present

## 2013-08-13 DIAGNOSIS — R279 Unspecified lack of coordination: Secondary | ICD-10-CM | POA: Diagnosis not present

## 2013-08-13 NOTE — Telephone Encounter (Signed)
Lucas for [facility] or me to see incase of cancellation or no show

## 2013-08-13 NOTE — Telephone Encounter (Signed)
Thank you :)

## 2013-08-13 NOTE — Telephone Encounter (Signed)
Can you get this patient a sooner appt with Dr. Leonie Man?   Dr. Leonie Man must see her next time, I have seen her x 2.

## 2013-08-15 ENCOUNTER — Ambulatory Visit: Payer: Medicare Other | Admitting: Rehabilitative and Restorative Service Providers"

## 2013-08-15 ENCOUNTER — Ambulatory Visit: Payer: Medicare Other | Admitting: Occupational Therapy

## 2013-08-15 DIAGNOSIS — R279 Unspecified lack of coordination: Secondary | ICD-10-CM | POA: Diagnosis not present

## 2013-08-15 DIAGNOSIS — M6281 Muscle weakness (generalized): Secondary | ICD-10-CM | POA: Diagnosis not present

## 2013-08-15 DIAGNOSIS — IMO0001 Reserved for inherently not codable concepts without codable children: Secondary | ICD-10-CM | POA: Diagnosis not present

## 2013-08-15 DIAGNOSIS — R5381 Other malaise: Secondary | ICD-10-CM | POA: Diagnosis not present

## 2013-08-22 ENCOUNTER — Ambulatory Visit: Payer: Medicare Other | Admitting: Rehabilitative and Restorative Service Providers"

## 2013-08-22 ENCOUNTER — Ambulatory Visit: Payer: Medicare Other | Admitting: Occupational Therapy

## 2013-08-22 DIAGNOSIS — M6281 Muscle weakness (generalized): Secondary | ICD-10-CM | POA: Diagnosis not present

## 2013-08-22 DIAGNOSIS — IMO0001 Reserved for inherently not codable concepts without codable children: Secondary | ICD-10-CM | POA: Diagnosis not present

## 2013-08-22 DIAGNOSIS — R279 Unspecified lack of coordination: Secondary | ICD-10-CM | POA: Diagnosis not present

## 2013-08-22 DIAGNOSIS — R5381 Other malaise: Secondary | ICD-10-CM | POA: Diagnosis not present

## 2013-08-24 ENCOUNTER — Ambulatory Visit: Payer: Medicare Other | Admitting: Rehabilitative and Restorative Service Providers"

## 2013-08-24 ENCOUNTER — Telehealth: Payer: Self-pay | Admitting: Neurology

## 2013-08-24 ENCOUNTER — Emergency Department (HOSPITAL_COMMUNITY): Payer: Medicare Other

## 2013-08-24 ENCOUNTER — Encounter: Payer: Medicare Other | Admitting: Occupational Therapy

## 2013-08-24 ENCOUNTER — Emergency Department (HOSPITAL_COMMUNITY)
Admission: EM | Admit: 2013-08-24 | Discharge: 2013-08-25 | Disposition: A | Discharge status: Home / Self Care | Payer: Medicare Other | Source: Home / Self Care | Attending: Emergency Medicine | Admitting: Emergency Medicine

## 2013-08-24 ENCOUNTER — Encounter (HOSPITAL_COMMUNITY): Payer: Self-pay | Admitting: Emergency Medicine

## 2013-08-24 DIAGNOSIS — M129 Arthropathy, unspecified: Secondary | ICD-10-CM | POA: Insufficient documentation

## 2013-08-24 DIAGNOSIS — I4892 Unspecified atrial flutter: Secondary | ICD-10-CM | POA: Insufficient documentation

## 2013-08-24 DIAGNOSIS — R918 Other nonspecific abnormal finding of lung field: Secondary | ICD-10-CM | POA: Diagnosis not present

## 2013-08-24 DIAGNOSIS — Z87891 Personal history of nicotine dependence: Secondary | ICD-10-CM | POA: Insufficient documentation

## 2013-08-24 DIAGNOSIS — Z7901 Long term (current) use of anticoagulants: Secondary | ICD-10-CM | POA: Diagnosis not present

## 2013-08-24 DIAGNOSIS — R269 Unspecified abnormalities of gait and mobility: Secondary | ICD-10-CM | POA: Diagnosis not present

## 2013-08-24 DIAGNOSIS — R29898 Other symptoms and signs involving the musculoskeletal system: Secondary | ICD-10-CM | POA: Diagnosis present

## 2013-08-24 DIAGNOSIS — Z8673 Personal history of transient ischemic attack (TIA), and cerebral infarction without residual deficits: Secondary | ICD-10-CM | POA: Diagnosis not present

## 2013-08-24 DIAGNOSIS — R4182 Altered mental status, unspecified: Secondary | ICD-10-CM | POA: Diagnosis not present

## 2013-08-24 DIAGNOSIS — I639 Cerebral infarction, unspecified: Secondary | ICD-10-CM

## 2013-08-24 DIAGNOSIS — I69998 Other sequelae following unspecified cerebrovascular disease: Secondary | ICD-10-CM | POA: Diagnosis not present

## 2013-08-24 DIAGNOSIS — M549 Dorsalgia, unspecified: Secondary | ICD-10-CM | POA: Insufficient documentation

## 2013-08-24 DIAGNOSIS — I10 Essential (primary) hypertension: Secondary | ICD-10-CM | POA: Diagnosis not present

## 2013-08-24 DIAGNOSIS — H9190 Unspecified hearing loss, unspecified ear: Secondary | ICD-10-CM | POA: Insufficient documentation

## 2013-08-24 DIAGNOSIS — Z5189 Encounter for other specified aftercare: Secondary | ICD-10-CM | POA: Diagnosis not present

## 2013-08-24 DIAGNOSIS — E785 Hyperlipidemia, unspecified: Secondary | ICD-10-CM | POA: Diagnosis present

## 2013-08-24 DIAGNOSIS — I672 Cerebral atherosclerosis: Secondary | ICD-10-CM | POA: Diagnosis not present

## 2013-08-24 DIAGNOSIS — Z860100 Personal history of colon polyps, unspecified: Secondary | ICD-10-CM | POA: Insufficient documentation

## 2013-08-24 DIAGNOSIS — G43909 Migraine, unspecified, not intractable, without status migrainosus: Secondary | ICD-10-CM | POA: Insufficient documentation

## 2013-08-24 DIAGNOSIS — I69959 Hemiplegia and hemiparesis following unspecified cerebrovascular disease affecting unspecified side: Secondary | ICD-10-CM | POA: Diagnosis not present

## 2013-08-24 DIAGNOSIS — R509 Fever, unspecified: Secondary | ICD-10-CM | POA: Diagnosis not present

## 2013-08-24 DIAGNOSIS — M48061 Spinal stenosis, lumbar region without neurogenic claudication: Secondary | ICD-10-CM | POA: Diagnosis not present

## 2013-08-24 DIAGNOSIS — R279 Unspecified lack of coordination: Secondary | ICD-10-CM | POA: Diagnosis not present

## 2013-08-24 DIAGNOSIS — I4891 Unspecified atrial fibrillation: Secondary | ICD-10-CM | POA: Diagnosis not present

## 2013-08-24 DIAGNOSIS — M278 Other specified diseases of jaws: Secondary | ICD-10-CM | POA: Insufficient documentation

## 2013-08-24 DIAGNOSIS — I6789 Other cerebrovascular disease: Secondary | ICD-10-CM | POA: Diagnosis not present

## 2013-08-24 DIAGNOSIS — Z8739 Personal history of other diseases of the musculoskeletal system and connective tissue: Secondary | ICD-10-CM | POA: Insufficient documentation

## 2013-08-24 DIAGNOSIS — M6281 Muscle weakness (generalized): Secondary | ICD-10-CM | POA: Diagnosis not present

## 2013-08-24 DIAGNOSIS — G40909 Epilepsy, unspecified, not intractable, without status epilepticus: Secondary | ICD-10-CM | POA: Diagnosis not present

## 2013-08-24 DIAGNOSIS — R488 Other symbolic dysfunctions: Secondary | ICD-10-CM | POA: Diagnosis not present

## 2013-08-24 DIAGNOSIS — F4489 Other dissociative and conversion disorders: Secondary | ICD-10-CM | POA: Diagnosis not present

## 2013-08-24 DIAGNOSIS — I6350 Cerebral infarction due to unspecified occlusion or stenosis of unspecified cerebral artery: Secondary | ICD-10-CM | POA: Diagnosis not present

## 2013-08-24 DIAGNOSIS — Z79899 Other long term (current) drug therapy: Secondary | ICD-10-CM | POA: Insufficient documentation

## 2013-08-24 DIAGNOSIS — Z872 Personal history of diseases of the skin and subcutaneous tissue: Secondary | ICD-10-CM | POA: Insufficient documentation

## 2013-08-24 LAB — URINE MICROSCOPIC-ADD ON

## 2013-08-24 LAB — COMPREHENSIVE METABOLIC PANEL
ALT: 16 U/L (ref 0–35)
AST: 30 U/L (ref 0–37)
Albumin: 3.2 g/dL — ABNORMAL LOW (ref 3.5–5.2)
Alkaline Phosphatase: 68 U/L (ref 39–117)
BILIRUBIN TOTAL: 0.2 mg/dL — AB (ref 0.3–1.2)
BUN: 18 mg/dL (ref 6–23)
CHLORIDE: 106 meq/L (ref 96–112)
CO2: 31 meq/L (ref 19–32)
CREATININE: 0.79 mg/dL (ref 0.50–1.10)
Calcium: 9.1 mg/dL (ref 8.4–10.5)
GFR calc Af Amer: 85 mL/min — ABNORMAL LOW (ref >90)
GFR, EST NON AFRICAN AMERICAN: 73 mL/min — AB (ref >90)
GLUCOSE: 95 mg/dL (ref 70–99)
Potassium: 4.5 mEq/L (ref 3.7–5.3)
Sodium: 145 mEq/L (ref 137–147)
Total Protein: 6.3 g/dL (ref 6.0–8.3)

## 2013-08-24 LAB — URINALYSIS, ROUTINE W REFLEX MICROSCOPIC
Bilirubin Urine: NEGATIVE
Glucose, UA: NEGATIVE mg/dL
Ketones, ur: NEGATIVE mg/dL
Nitrite: NEGATIVE
PROTEIN: NEGATIVE mg/dL
Specific Gravity, Urine: 1.015 (ref 1.005–1.030)
UROBILINOGEN UA: 0.2 mg/dL (ref 0.0–1.0)
pH: 7 (ref 5.0–8.0)

## 2013-08-24 LAB — CBC
HEMATOCRIT: 38.8 % (ref 36.0–46.0)
Hemoglobin: 12.7 g/dL (ref 12.0–15.0)
MCH: 31.1 pg (ref 26.0–34.0)
MCHC: 32.7 g/dL (ref 30.0–36.0)
MCV: 95.1 fL (ref 78.0–100.0)
Platelets: 169 10*3/uL (ref 150–400)
RBC: 4.08 MIL/uL (ref 3.87–5.11)
RDW: 15.2 % (ref 11.5–15.5)
WBC: 3.9 10*3/uL — ABNORMAL LOW (ref 4.0–10.5)

## 2013-08-24 LAB — PROTIME-INR
INR: 1.08 (ref 0.00–1.49)
Prothrombin Time: 13.8 seconds (ref 11.6–15.2)

## 2013-08-24 LAB — TROPONIN I: Troponin I: <0.3 ng/mL (ref <0.30)

## 2013-08-24 LAB — VALPROIC ACID LEVEL: Valproic Acid Lvl: 46 ug/mL — ABNORMAL LOW (ref 50.0–100.0)

## 2013-08-24 NOTE — Telephone Encounter (Signed)
67 year with remote history of right hemispheric subcortical infarct in May 2014 with recent admission in December 2014 for transient worsening of left hemiparesis with MRI not showing any new right brain stroke. Left temporoparietal subacute lesion seen on MRI in December 2014 in fact may have been delayed hemorrhagic contusion following a fall she sustained in November 2014 and was not recognized as such during admission in December 2014. She remains on xarelto for her atrial fibrillation/flutter and prior history of stroke rather than a silent infarct .Marland KitchenMarland KitchenPatient mental status according to her daughter has declined, patient had a MRI on the 08-11-2013 and seen that she had another stroke,Acute infarct involving the right corpus callosum and right frontal white matter,Atrophy and extensive chronic ischemia. Altered Mental status has been declining for about 2 weeks ago and in the 2 days they seen even more of a decline she respond but not in a normal way according to her daughter she giggles and that . Please advise

## 2013-08-24 NOTE — ED Notes (Signed)
Daughters now with pt. Daughters states "she was giggling this morning. She has been confused for months, but her doctor wanted her to come here to be evaluated for a stroke." Pt remains AO x4. Daughters state she is at baseline now. Daughters also reports that she has had weakness to left leg for "weeks" but this morning that "she couldn't even stand up." Pt is now able to stand and pivot.

## 2013-08-24 NOTE — Discharge Instructions (Signed)
Ischemic Stroke Follow up with your doctor and neurology. Return to the ED if you develop new or worsening symptoms. A stroke (cerebrovascular accident) is the sudden death of brain tissue. It is a medical emergency. A stroke can cause permanent loss of brain function. This can cause problems with different parts of your body. A transient ischemic attack (TIA) is different because it does not cause permanent damage. A TIA is a short-lived problem of poor blood flow affecting a part of the brain. A TIA is also a serious problem because having a TIA greatly increases the chances of having a stroke. When symptoms first develop, you cannot know if the problem might be a stroke or TIA. CAUSES  A stroke is caused by a decrease of oxygen supply to an area of your brain. It is usually the result of a small blood clot or collection of cholesterol or fat (plaque) that blocks blood flow in the brain. A stroke can also be caused by blocked or damaged carotid arteries.  RISK FACTORS  High blood pressure (hypertension).  High cholesterol.  Diabetes mellitus.  Heart disease.  The build up of plaque in the blood vessels (peripheral artery disease or atherosclerosis).  The build up of plaque in the blood vessels providing blood and oxygen to the brain (carotid artery stenosis).  An abnormal heart rhythm (atrial fibrillation).  Obesity.  Smoking.  Taking oral contraceptives (especially in combination with smoking).  Physical inactivity.  A diet high in fats, salt (sodium), and calories.  Alcohol use.  Use of illegal drugs (especially cocaine and methamphetamine).  Being African American.  Being over the age of 101.  Family history of stroke.  Previous history of blood clots, stroke, TIA, or heart attack.  Sickle cell disease. SYMPTOMS  These symptoms usually develop suddenly, or may be newly present upon awakening from sleep:  Sudden weakness or numbness of the face, arm, or leg,  especially on one side of the body.  Sudden trouble walking or difficulty moving arms or legs.  Sudden confusion.  Sudden personality changes.  Trouble speaking (aphasia) or understanding.  Difficulty swallowing.  Sudden trouble seeing in one or both eyes.  Double vision.  Dizziness.  Loss of balance or coordination.  Sudden severe headache with no known cause.  Trouble reading or writing. DIAGNOSIS  Your caregiver can often determine the presence or absence of a stroke based on your symptoms, history, and physical exam. Computed tomography (CT) of the brain is usually performed to confirm the stroke, determine causes, and determine stroke severity. Other tests may be done to find the cause of the stroke. These tests may include:  Electrocardiography.  Continuous heart monitoring.  Echocardiography.  Carotid ultrasonography.  Magnetic resonance imaging (MRI).  A scan of the brain circulation.  Blood tests. PREVENTION  The risk of a stroke can be decreased by appropriately treating high blood pressure, high cholesterol, diabetes, heart disease, and obesity and by quitting smoking, limiting alcohol, and staying physically active. TREATMENT  Time is of the essence. It is important to seek treatment within 3 4 hours of the start of symptoms because you may receive a medicine to dissolve the clot (thrombolytic) that cannot be given after that time. Even if you do not know when your symptoms began, get treatment as soon as possible. After the 4 hour window has passed, treatment may include rest, oxygen, intravenous (IV) fluids, and medicines to thin the blood (anticoagulants). Treatment of stroke depends on the duration, severity, and cause  of your symptoms. Medicines and diet may be used to address diabetes, high blood pressure, and other risk factors. Physical, speech, and occupational therapists will assess you and work to improve any functions impaired by the stroke.  Measures will be taken to prevent short-term and long-term complications, including infection from breathing foreign material into the lungs (aspiration pneumonia), blood clots in the legs, bedsores, and falls. Rarely, surgery may be needed to remove large blood clots or to open up blocked arteries. HOME CARE INSTRUCTIONS   Take all medicines prescribed by your caregiver. Follow the directions carefully. Medicines may be used to control risk factors for a stroke. Be sure you understand all your medicine instructions.  You may be told to take aspirin or the anticoagulant warfarin. Warfarin needs to be taken exactly as instructed.  Too much and too little warfarin are both dangerous. Too much warfarin increases the risk of bleeding. Too little warfarin continues to allow the risk for blood clots. While taking warfarin, you will need to have regular blood tests to measure your blood clotting time. These blood tests usually include both the PT and INR tests. The PT and INR results allow your caregiver to adjust your dose of warfarin. The dose can change for many reasons. It is critically important that you take warfarin exactly as prescribed, and that you have your PT and INR levels drawn exactly as directed.  Many foods, especially foods high in vitamin K can interfere with warfarin and affect the PT and INR results. Foods high in vitamin K include spinach, kale, broccoli, cabbage, collard and turnip greens, brussels sprouts, peas, cauliflower, seaweed, and parsley as well as beef and pork liver, green tea, and soybean oil. You should eat a consistent amount of foods high in vitamin K. Avoid major changes in your diet, or notify your caregiver before changing your diet. Arrange a visit with a dietitian to answer your questions.  Many medicines can interfere with warfarin and affect the PT and INR results. You must tell your caregiver about any and all medicines you take, this includes all vitamins and  supplements. Be especially cautious with aspirin and anti-inflammatory medicines. Do not take or discontinue any prescribed or over-the-counter medicine except on the advice of your caregiver or pharmacist.  Warfarin can have side effects, such as excessive bruising or bleeding. You will need to hold pressure over cuts for longer than usual. Your caregiver or pharmacist will discuss other potential side effects.  Avoid sports or activities that may cause injury or bleeding.  Be mindful when shaving, flossing your teeth, or handling sharp objects.  Alcohol can change the body's ability to handle warfarin. It is best to avoid alcoholic drinks or consume only very small amounts while taking warfarin. Notify your caregiver if you change your alcohol intake.  Notify your dentist or other caregivers before procedures.  If swallow studies have determined that your swallowing reflex is present, you should eat healthy foods. A diet that includes 5 or more servings of fruits and vegetables a day may reduce the risk of stroke. Foods may need to be a special consistency (soft or pureed), or small bites may need to be taken in order to avoid aspirating or choking. Certain diets may be prescribed to address high blood pressure, high cholesterol, diabetes, or obesity.  A low-sodium, low-saturated fat, low-trans fat, low-cholesterol diet is recommended to manage high blood pressure.  A low-saturated fat, low-trans fat, low-cholesterol, and high-fiber diet may control cholesterol levels.  A  controlled-carbohydrate, controlled-sugar diet is recommended to manage diabetes.  A reduced-calorie, low-sodium, low-saturated fat, low-trans fat, low-cholesterol diet is recommended to manage obesity.  Maintain a healthy weight.  Stay physically active. It is recommended that you get at least 30 minutes of activity on most or all days.  Do not smoke.  Limit alcohol use even if you are not taking warfarin. Moderate  alcohol use is considered to be:  No more than 2 drinks each day for men.  No more than 1 drink each day for nonpregnant women.  Stop drug abuse.  Home safety. A safe home environment is important to reduce the risk of falls. Your caregiver may arrange for specialists to evaluate your home. Having grab bars in the bedroom and bathroom is often important. Your caregiver may arrange for equipment to be used at home, such as raised toilets and a seat for the shower.  Physical, occupational, and speech therapy. Ongoing therapy may be needed to maximize your recovery after a stroke. If you have been advised to use a walker or a cane, use it at all times. Be sure to keep your therapy appointments.  Follow all instructions for follow-up with your caregiver. This is very important. This includes any referrals, physical therapy, rehabilitation, and lab tests. Proper follow up can prevent another stroke from occurring. SEEK MEDICAL CARE IF:  You have personality changes.  You have difficulty swallowing.  You are seeing double.  You have dizziness.  You have a fever.  You have skin breakdown. SEEK IMMEDIATE MEDICAL CARE IF:  Any of these symptoms may represent a serious problem that is an emergency. Do not wait to see if the symptoms will go away. Get medical help right away. Call your local emergency services (911 in [place] Do not drive yourself to the hospital.  You have sudden weakness or numbness of the face, arm, or leg, especially on one side of the body.  You have sudden trouble walking or difficulty moving arms or legs.  You have sudden confusion.  You have trouble speaking (aphasia) or understanding.  You have sudden trouble seeing in one or both eyes.  You have a loss of balance or coordination.  You have a sudden, severe headache with no known cause.  You have new chest pain or an irregular heartbeat.  You have a partial or total loss of consciousness.   Document  Released: 03/15/2005 Document Revised: 11/15/2012 Document Reviewed: 10/24/2011 [facility] Patient Information 2014 [place].

## 2013-08-24 NOTE — ED Provider Notes (Signed)
CSN: [id number]     Arrival date & time 08/24/13  1531 History   First MD Initiated Contact with Patient 08/24/13 1916     Chief Complaint  Patient presents with  . Altered Mental Status  . Extremity Weakness     (Consider location/radiation/quality/duration/timing/severity/associated sxs/prior Treatment) HPI Comments: Patient presents from home with worsening left-sided weakness over the past several days. Patient has had 2 previous strokes most recently on May 12 with residual left-sided weakness. Daughter states that patient has been having difficulty with her left leg and difficulty getting to the bathroom throughout the day today. He feeling her left leg is giving out on her. She did not fall or hit head. She was seen here on may fifth and treated for UTI and completed that course of antibiotics. She denies any headache, neck pain, chest pain, abdominal pain, cough or fever. He is not yet seen neurology this most recent stroke. Patient is alert and oriented x3 on arrival. She endorses some low back pain after sitting in the waiting room.  The history is provided by the patient, the EMS personnel and a relative.    Past Medical History  Diagnosis Date  . Arthritis   . Hypertension   . Migraine   . Colon polyp   . Hearing difficulty   . Torus palatinus   . Atrial flutter January, 2012  . Stroke 08/02/12     right lenticular nucleus infarct  . Left leg weakness 02/05/2013  . Chronic anticoagulation   . High cholesterol   . Atrial fibrillation    Past Surgical History  Procedure Laterality Date  . Cataract extraction w/ intraocular lens  implant, bilateral  2006-2008  . Ganglion cyst excision Bilateral 1938,1954,2003,2005    "wrists/hand" (08/01/2012)  . Cardioversion  05/19/2010    Dr. Einar Gip  . Tonsillectomy  ~ 1935  . Appendectomy  02/19/53    `  . Mouth surgery      [name]   Family History  Problem Relation Age of Onset  . Colon cancer    . Breast cancer    . Brain  cancer    . Cancer Mother     breast  . Aneurysm Mother     brain  . Heart attack Neg Hx   . Diabetes Neg Hx   . Hypertension Neg Hx    History  Substance Use Topics  . Smoking status: Former Research scientist (life sciences)  . Smokeless tobacco: Never Used  . Alcohol Use: 0.0 oz/week     Comment: 08/01/2012 "glass of wine on special occasions"   OB History   Grav Para Term Preterm Abortions TAB SAB Ect Mult Living                 Review of Systems  Constitutional: Negative for fever, activity change and appetite change.  Respiratory: Negative for cough, chest tightness and shortness of breath.   Cardiovascular: Negative for chest pain.  Gastrointestinal: Negative for nausea, vomiting and abdominal pain.  Genitourinary: Negative for dysuria, vaginal bleeding, vaginal discharge and genital sores.  Musculoskeletal: Positive for arthralgias, back pain, extremity weakness and myalgias.  Skin: Negative for rash.  Neurological: Positive for weakness. Negative for dizziness and headaches.  A complete 10 system review of systems was obtained and all systems are negative except as noted in the HPI and PMH.      Allergies  Tramadol hcl and Alendronate sodium  Home Medications   Prior to Admission medications   Medication Sig Start Date  End Date Taking? Authorizing Provider  acetaminophen (TYLENOL) 500 MG tablet Take 1,000 mg by mouth daily as needed for mild pain or headache.   Yes Historical Provider, MD  aspirin-acetaminophen-caffeine (EXCEDRIN MIGRAINE) [phone] MG per tablet Take 2 tablets by mouth daily as needed for pain (for migraines).   Yes Historical Provider, MD  b complex vitamins tablet Take 1 tablet by mouth 2 (two) times daily.    Yes Historical Provider, MD  Calcium Carb-Cholecalciferol (CALCIUM 600 + D) 600-200 MG-UNIT TABS Take 1 tablet by mouth 2 (two) times daily.   Yes Historical Provider, MD  Cholecalciferol (VITAMIN D) 2000 UNITS tablet Take 2,000 Units by mouth daily.   Yes  Historical Provider, MD  divalproex (DEPAKOTE) 125 MG DR tablet Take 375 mg by mouth 2 (two) times daily.   Yes Historical Provider, MD  Multiple Vitamin (MULTIVITAMIN WITH MINERALS) TABS Take 1 tablet by mouth daily.   Yes Historical Provider, MD  Rivaroxaban (XARELTO) 15 MG TABS tablet Take 15 mg by mouth daily with supper.   Yes Historical Provider, MD   BP 168/83  Pulse 70  Temp(Src) 97.4 F (36.3 C) (Axillary)  Resp 18  SpO2 98% Physical Exam  Constitutional: She is oriented to person, place, and time. She appears well-developed and well-nourished. No distress.  HENT:  Head: Normocephalic and atraumatic.  Mouth/Throat: Oropharynx is clear and moist. No oropharyngeal exudate.  Eyes: Conjunctivae and EOM are normal. Pupils are equal, round, and reactive to light. Left eye exhibits no discharge.  Neck: Normal range of motion. Neck supple.  Cardiovascular: Normal rate, regular rhythm and normal heart sounds.   Pulmonary/Chest: Effort normal and breath sounds normal. No respiratory distress.  Abdominal: Soft. There is no tenderness. There is no rebound and no guarding.  Musculoskeletal: Normal range of motion. She exhibits no edema and no tenderness.  Neurological: She is alert and oriented to person, place, and time. No cranial nerve deficit. She exhibits normal muscle tone. Coordination normal.  4/5 strength in the left upper and lower arm. 4/5 strength left leg. 5 out of 5 strength on the right. Cranial nerves 2-12 intact. No ataxia finger to nose.  Skin: Skin is warm.    ED Course  Procedures (including critical care time) Labs Review Labs Reviewed  CBC - Abnormal; Notable for the following:    WBC 3.9 (*)    All other components within normal limits  COMPREHENSIVE METABOLIC PANEL - Abnormal; Notable for the following:    Albumin 3.2 (*)    Total Bilirubin 0.2 (*)    GFR calc non Af Amer 73 (*)    GFR calc Af Amer 85 (*)    All other components within normal limits   URINALYSIS, ROUTINE W REFLEX MICROSCOPIC - Abnormal; Notable for the following:    APPearance CLOUDY (*)    Hgb urine dipstick TRACE (*)    Leukocytes, UA SMALL (*)    All other components within normal limits  VALPROIC ACID LEVEL - Abnormal; Notable for the following:    Valproic Acid Lvl 46.0 (*)    All other components within normal limits  URINE MICROSCOPIC-ADD ON - Abnormal; Notable for the following:    Bacteria, UA FEW (*)    All other components within normal limits  URINE CULTURE  PROTIME-INR  TROPONIN I    Imaging Review Ct Head Wo Contrast  08/24/2013   CLINICAL DATA:  Confusion.  Altered mental status.  EXAM: CT HEAD WITHOUT CONTRAST  TECHNIQUE: Contiguous axial  images were obtained from the base of the skull through the vertex without contrast.  COMPARISON:  Head CT 07/31/2013 and MRI 08/11/2013  FINDINGS: Again noted is diffuse cerebral atrophy. There is stable low-density in the periventricular and subcortical white matter. Old infarct involving the right white matter tract. Subtle low-density along the right corpus callosum is probably related to the recently diagnosed infarct. No evidence for acute hemorrhage, mass lesion, midline shift or hydrocephalus. No acute bone abnormality. Again noted is frothy material in the right sphenoid sinus.  IMPRESSION: No acute intracranial abnormality.  Atrophy and evidence for old ischemic changes. Subtle low-density in the region of the right corpus callosum probably related to the recent infarct.   Electronically Signed   By: Markus Daft M.D.   On: 08/24/2013 16:58   Mr Brain Wo Contrast  08/24/2013   CLINICAL DATA:  Altered mental status. Extremity weakness. Known infarct 2 weeks ago. Hypertension.  EXAM: MRI HEAD WITHOUT CONTRAST  TECHNIQUE: Multiplanar, multiecho pulse sequences of the brain and surrounding structures were obtained without intravenous contrast.  COMPARISON:  08/24/2013 CT.  08/11/2013 MR.  FINDINGS: Slight increase in  size of right anterior cerebral artery distribution acute infarct (medial aspect the right frontal lobe) when compared to the 08/11/2013 exam.  No intracranial hemorrhage.  Remote infarct posterior right external capsule extending into posterior right coronal radiata.  Remote small right cerebellar infarcts.  Remote left periatrial infarct.  Prominent small vessel disease type changes.  Global atrophy without hydrocephalus.  No intracranial mass lesion noted on this unenhanced exam.  Major intracranial vascular structures are patent.  IMPRESSION: Slight increase in size of right anterior cerebral artery distribution acute infarct (medial aspect the right frontal lobe) when compared to the 08/11/2013 exam.  Remote infarcts and small vessel disease type changes as detailed above.   Electronically Signed   By: Chauncey Cruel M.D.   On: 08/24/2013 21:04   Mr Lumbar Spine Wo Contrast  08/24/2013   CLINICAL DATA:  Lower extremity weakness.  EXAM: MRI LUMBAR SPINE WITHOUT CONTRAST  TECHNIQUE: Multiplanar, multisequence MR imaging of the lumbar spine was performed. No intravenous contrast was administered.  COMPARISON:  02/13/2013 lumbar spine MR.  FINDINGS: Last fully open disk space is labeled L5-S1. Present examination incorporates from T11-12 disc space through the lower sacrum.  Conus L1-2 level.  Mild atherosclerotic type changes of the aorta are. Visualized paravertebral structures otherwise unremarkable.  T11-12: Minimal bulge.  T12-L1: Tiny central protrusion with slight caudal extension. No associated mass effect.  L1-2: Small Schmorl's node deformity. Minimal bulge. Mild facet joint degenerative changes.  L2-3: Mild to moderate facet joint degenerative changes. Ligamentum flavum hypertrophy greater on left. Bulge. Mild to moderate spinal stenosis.  L3-4: Prominent facet joint degenerative changes. Ligamentum flavum hypertrophy. Tiny synovial cyst. Mild retrolisthesis L3. Bulge. Multifactorial moderate to marked  spinal stenosis and lateral recess stenosis. Mild bilateral foraminal narrowing.  L4-5: Right greater left facet joint degenerative changes and ligamentum flavum hypertrophy. Minimal retrolisthesis L4. Bulge. Mild spinal stenosis. Mild right lateral recess stenosis. Mild foraminal narrowing bilaterally.  L5-S1: Central spur with indentation central aspect of the thecal sac without nerve root compression. Mild facet joint degenerative changes.  IMPRESSION: No significant change from the prior examination with most notable findings at the L3-4 level where there is multifactorial moderate to marked spinal stenosis and lateral recess stenosis.  Please see above for further detail.   Electronically Signed   By: Chauncey Cruel M.D.   On:  08/24/2013 21:28     EKG Interpretation   Date/Time:  Friday Aug 24 2013 15:47:06 EDT Ventricular Rate:  64 PR Interval:  158 QRS Duration: 86 QT Interval:  414 QTC Calculation: 427 R Axis:   63 Text Interpretation:  Normal sinus rhythm Septal infarct , age  undetermined Abnormal ECG No significant change was found Confirmed by  Wyvonnia Dusky  MD, Madyx Delfin [phone]) on 08/24/2013 11:53:33 PM      MDM   Final diagnoses:  CVA (cerebral infarction)   Worsening left-sided weakness after stroke on May 12. Patient's already on xarelto. "left leg giving out" with history of same.  CT scan is not show any acute infarct. Discussed with Dr. Leonel Ramsay of neurology who agrees patient needs repeat MRI to assess for new stroke.   MRI results reviewed with Dr. Leonel Ramsay. Her infarct appears to be similar to her previous. She is already on xarelto. Dr. Leonel Ramsay feels she does not need admission for this CVA. She last had echo and carotid dopplers in November 2014. Lumbar spine MRI is unchanged without any evidence of cauda equina or cord compression. Patient is able to ambulate without assistance.labs appear to be at baseline. UA is equivocal.  She is stable for follow up with  her PCP.  BP 168/83  Pulse 70  Temp(Src) 97.4 F (36.3 C) (Axillary)  Resp 18  SpO2 98%    Ezequiel Essex, MD 08/25/13 1139

## 2013-08-24 NOTE — Telephone Encounter (Signed)
Per Dr. Leonie Man the patient was told to go to the ER.

## 2013-08-24 NOTE — ED Notes (Signed)
Patient is resting.   Family is at bedside.  Family requesting to have something for patient to eat.  Patient with no s/sx of distress.  She denies any pain

## 2013-08-24 NOTE — ED Notes (Signed)
Per EMS: PT from home, per daughter pt has increased confusion x months. Pt is AO x4, follows all commands. Pt also reports intermittent left leg weakness/numbness x weeks. Has been seen by PCP for same. Pt is AO x4. BG 140. 145/70. 88 reg. 98% RA. Neuro intact. NIH 0. Pt denies any complaint including CP, SOB, pain, N/V.

## 2013-08-24 NOTE — ED Notes (Signed)
Pt to MRI

## 2013-08-24 NOTE — ED Notes (Signed)
Patient passed the swallow screen.  She is drinking water and eating applesauce

## 2013-08-24 NOTE — Telephone Encounter (Signed)
Patient's daughter calling again to state that they are very concerned about patient and that they really need someone to call her back. Please call and advise.

## 2013-08-24 NOTE — Telephone Encounter (Signed)
agree with plan

## 2013-08-24 NOTE — ED Notes (Signed)
Patient denies pain and is resting comfortably.  

## 2013-08-24 NOTE — Telephone Encounter (Signed)
Daughter called and stated patient couldn't wait until 6/24 appoinment. Her mental status has deteriorated and her L leg has gone out on her.  Please call and advise.

## 2013-08-25 NOTE — ED Notes (Signed)
Patient up to ambulate w/o difficulty.  Patient discharged home with family

## 2013-08-26 LAB — URINE CULTURE: Colony Count: >=100000

## 2013-08-27 ENCOUNTER — Inpatient Hospital Stay (HOSPITAL_BASED_OUTPATIENT_CLINIC_OR_DEPARTMENT_OTHER)
Admission: EM | Admit: 2013-08-27 | Discharge: 2013-08-30 | DRG: 065 | Disposition: A | Discharge status: Skilled Nursing Facility | Payer: Medicare Other | Attending: Internal Medicine | Admitting: Internal Medicine

## 2013-08-27 ENCOUNTER — Encounter (HOSPITAL_BASED_OUTPATIENT_CLINIC_OR_DEPARTMENT_OTHER): Payer: Self-pay | Admitting: Emergency Medicine

## 2013-08-27 ENCOUNTER — Telehealth: Payer: Self-pay | Admitting: Family

## 2013-08-27 ENCOUNTER — Emergency Department (HOSPITAL_BASED_OUTPATIENT_CLINIC_OR_DEPARTMENT_OTHER): Payer: Medicare Other

## 2013-08-27 DIAGNOSIS — Z7901 Long term (current) use of anticoagulants: Secondary | ICD-10-CM

## 2013-08-27 DIAGNOSIS — I480 Paroxysmal atrial fibrillation: Secondary | ICD-10-CM | POA: Diagnosis present

## 2013-08-27 DIAGNOSIS — H269 Unspecified cataract: Secondary | ICD-10-CM

## 2013-08-27 DIAGNOSIS — E785 Hyperlipidemia, unspecified: Secondary | ICD-10-CM | POA: Diagnosis present

## 2013-08-27 DIAGNOSIS — E7800 Pure hypercholesterolemia, unspecified: Secondary | ICD-10-CM

## 2013-08-27 DIAGNOSIS — R531 Weakness: Secondary | ICD-10-CM

## 2013-08-27 DIAGNOSIS — I4891 Unspecified atrial fibrillation: Secondary | ICD-10-CM | POA: Diagnosis present

## 2013-08-27 DIAGNOSIS — Z8673 Personal history of transient ischemic attack (TIA), and cerebral infarction without residual deficits: Secondary | ICD-10-CM

## 2013-08-27 DIAGNOSIS — N201 Calculus of ureter: Secondary | ICD-10-CM

## 2013-08-27 DIAGNOSIS — R29898 Other symptoms and signs involving the musculoskeletal system: Secondary | ICD-10-CM | POA: Diagnosis present

## 2013-08-27 DIAGNOSIS — R569 Unspecified convulsions: Secondary | ICD-10-CM

## 2013-08-27 DIAGNOSIS — K5900 Constipation, unspecified: Secondary | ICD-10-CM

## 2013-08-27 DIAGNOSIS — R739 Hyperglycemia, unspecified: Secondary | ICD-10-CM

## 2013-08-27 DIAGNOSIS — M949 Disorder of cartilage, unspecified: Secondary | ICD-10-CM

## 2013-08-27 DIAGNOSIS — I4892 Unspecified atrial flutter: Secondary | ICD-10-CM

## 2013-08-27 DIAGNOSIS — H9190 Unspecified hearing loss, unspecified ear: Secondary | ICD-10-CM

## 2013-08-27 DIAGNOSIS — G459 Transient cerebral ischemic attack, unspecified: Secondary | ICD-10-CM

## 2013-08-27 DIAGNOSIS — I639 Cerebral infarction, unspecified: Secondary | ICD-10-CM

## 2013-08-27 DIAGNOSIS — Z86010 Personal history of colonic polyps: Secondary | ICD-10-CM

## 2013-08-27 DIAGNOSIS — I69998 Other sequelae following unspecified cerebrovascular disease: Secondary | ICD-10-CM

## 2013-08-27 DIAGNOSIS — R918 Other nonspecific abnormal finding of lung field: Secondary | ICD-10-CM | POA: Diagnosis not present

## 2013-08-27 DIAGNOSIS — G43909 Migraine, unspecified, not intractable, without status migrainosus: Secondary | ICD-10-CM

## 2013-08-27 DIAGNOSIS — M899 Disorder of bone, unspecified: Secondary | ICD-10-CM

## 2013-08-27 DIAGNOSIS — I10 Essential (primary) hypertension: Secondary | ICD-10-CM | POA: Diagnosis present

## 2013-08-27 DIAGNOSIS — M159 Polyosteoarthritis, unspecified: Secondary | ICD-10-CM

## 2013-08-27 DIAGNOSIS — M129 Arthropathy, unspecified: Secondary | ICD-10-CM | POA: Diagnosis present

## 2013-08-27 DIAGNOSIS — N390 Urinary tract infection, site not specified: Secondary | ICD-10-CM

## 2013-08-27 DIAGNOSIS — I6350 Cerebral infarction due to unspecified occlusion or stenosis of unspecified cerebral artery: Secondary | ICD-10-CM | POA: Diagnosis not present

## 2013-08-27 DIAGNOSIS — Z87891 Personal history of nicotine dependence: Secondary | ICD-10-CM

## 2013-08-27 LAB — CBC WITH DIFFERENTIAL/PLATELET
Basophils Absolute: 0 10*3/uL (ref 0.0–0.1)
Basophils Relative: 0 % (ref 0–1)
EOS PCT: 2 % (ref 0–5)
Eosinophils Absolute: 0.1 10*3/uL (ref 0.0–0.7)
HEMATOCRIT: 37.8 % (ref 36.0–46.0)
HEMOGLOBIN: 12.3 g/dL (ref 12.0–15.0)
LYMPHS PCT: 32 % (ref 12–46)
Lymphs Abs: 1.6 10*3/uL (ref 0.7–4.0)
MCH: 31.5 pg (ref 26.0–34.0)
MCHC: 32.5 g/dL (ref 30.0–36.0)
MCV: 96.9 fL (ref 78.0–100.0)
MONOS PCT: 20 % — AB (ref 3–12)
Monocytes Absolute: 1 10*3/uL (ref 0.1–1.0)
NEUTROS ABS: 2.2 10*3/uL (ref 1.7–7.7)
Neutrophils Relative %: 45 % (ref 43–77)
Platelets: 144 10*3/uL — ABNORMAL LOW (ref 150–400)
RBC: 3.9 MIL/uL (ref 3.87–5.11)
RDW: 15 % (ref 11.5–15.5)
WBC: 4.8 10*3/uL (ref 4.0–10.5)

## 2013-08-27 LAB — PROTIME-INR
INR: 1.72 — ABNORMAL HIGH (ref 0.00–1.49)
Prothrombin Time: 19.7 seconds — ABNORMAL HIGH (ref 11.6–15.2)

## 2013-08-27 LAB — URINALYSIS, ROUTINE W REFLEX MICROSCOPIC
Bilirubin Urine: NEGATIVE
Glucose, UA: NEGATIVE mg/dL
Hgb urine dipstick: NEGATIVE
Ketones, ur: NEGATIVE mg/dL
NITRITE: NEGATIVE
PROTEIN: NEGATIVE mg/dL
Specific Gravity, Urine: 1.02 (ref 1.005–1.030)
UROBILINOGEN UA: 0.2 mg/dL (ref 0.0–1.0)
pH: 6.5 (ref 5.0–8.0)

## 2013-08-27 LAB — COMPREHENSIVE METABOLIC PANEL
ALT: 12 U/L (ref 0–35)
AST: 22 U/L (ref 0–37)
Albumin: 3.2 g/dL — ABNORMAL LOW (ref 3.5–5.2)
Alkaline Phosphatase: 70 U/L (ref 39–117)
BILIRUBIN TOTAL: 0.2 mg/dL — AB (ref 0.3–1.2)
BUN: 22 mg/dL (ref 6–23)
CALCIUM: 9.6 mg/dL (ref 8.4–10.5)
CHLORIDE: 105 meq/L (ref 96–112)
CO2: 31 mEq/L (ref 19–32)
Creatinine, Ser: 1.1 mg/dL (ref 0.50–1.10)
GFR calc non Af Amer: 44 mL/min — ABNORMAL LOW (ref >90)
GFR, EST AFRICAN AMERICAN: 51 mL/min — AB (ref >90)
Glucose, Bld: 106 mg/dL — ABNORMAL HIGH (ref 70–99)
Potassium: 5.3 mEq/L (ref 3.7–5.3)
Sodium: 146 mEq/L (ref 137–147)
Total Protein: 6.2 g/dL (ref 6.0–8.3)

## 2013-08-27 LAB — CBG MONITORING, ED: Glucose-Capillary: 107 mg/dL — ABNORMAL HIGH (ref 70–99)

## 2013-08-27 LAB — URINE MICROSCOPIC-ADD ON

## 2013-08-27 NOTE — ED Notes (Signed)
Labs drawn and sent EKG completed xray for CXR at beside.

## 2013-08-27 NOTE — ED Notes (Signed)
Pts family reports increased weakness, hx of recent stroke.  Scheduled to see PCP tomorrow.  Reports left leg weakness has increased.  Pt A/O at this time.

## 2013-08-27 NOTE — Telephone Encounter (Signed)
Patient is incoherant again today.  Daughter would like results of urine take in the emergency room

## 2013-08-27 NOTE — Telephone Encounter (Signed)
Please advise 

## 2013-08-27 NOTE — ED Provider Notes (Signed)
CSN: [id number]     Arrival date & time 08/27/13  1814 History  This chart was scribed for Tanna Furry, MD by Ladene Artist, ED Scribe. The patient was seen in room MH04/MH04. Patient's care was started at 7:14 PM.   Chief Complaint  Patient presents with  . Weakness   The history is provided by the patient. No language interpreter was used.   HPI Comments: GILDA ABBOUD is a 80 y.o. female who presents to the Emergency Department complaining of  weakness in left leg over the past 2 weeks. She reports associated numbness in L leg. She denies falling. Pt ambulates with walker, gait belt or with someone with her. Daughters state that she has needed more assistance lately.   Patient was seen by her primary care physician on the fifth. Was having some weakness. Had an outpatient MRI that showed a right parietal stroke. Was still doing fairly well at home. Was getting intermittent home physical therapy. Seemed to get worse with her weakness in her leg. Seen in the emergency room. Diagnosed with UTI and given Cipro and sent home. Really did not improve. Continue to have symptoms of left leg weakness. Now requiring max assist of 2 family members to perform ADLs.  Seen here 2 days ago. ED physician spoke with urologist. Neurologist felt that stroke was unchanged. Family was discharged. Continues to do poorly and family states it is taking at least 2 people to help her around to do her ADLs which is a significant change for her. Official radiology read showed slight extension of her right parietal infarct. They called their nurse practitioner today. She lifted the results. They were referred here.   Pt has a h/o stroke, which left her with L sided weakness. She was seen 07/31/13 for UTI with associated frequency and malodor. Daughters noted cognitive changes since late April/early May and requested a MRI which showed a new small stroke on the R. Pt also had a MRI 3 days ago at [facility]. Daughters state that pt laughs  more than often. Pt denies confusion. They deny fever, cough, vomiting, appetite change, abdominal pain, urinary symptoms. Pt is still taking Xarelto. She had in-home therapy prior to outpatient therapy. Pt goes to PT and OT at [facility]. Daughter canceled her appointment 3 days ago due to worsening symptoms.    Past Medical History  Diagnosis Date  . Arthritis   . Hypertension   . Migraine   . Colon polyp   . Hearing difficulty   . Torus palatinus   . Atrial flutter January, 2012  . Stroke 08/02/12     right lenticular nucleus infarct  . Left leg weakness 02/05/2013  . Chronic anticoagulation   . High cholesterol   . Atrial fibrillation    Past Surgical History  Procedure Laterality Date  . Cataract extraction w/ intraocular lens  implant, bilateral  2006-2008  . Ganglion cyst excision Bilateral 1938,1954,2003,2005    "wrists/hand" (08/01/2012)  . Cardioversion  05/19/2010    Dr. Einar Gip  . Tonsillectomy  ~ 1935  . Appendectomy  02/19/53    `  . Mouth surgery      [name]   Family History  Problem Relation Age of Onset  . Colon cancer    . Breast cancer    . Brain cancer    . Cancer Mother     breast  . Aneurysm Mother     brain  . Heart attack Neg Hx   . Diabetes Neg  Hx   . Hypertension Neg Hx    History  Substance Use Topics  . Smoking status: Former Research scientist (life sciences)  . Smokeless tobacco: Never Used  . Alcohol Use: 0.0 oz/week     Comment: 08/01/2012 "glass of wine on special occasions"   OB History   Grav Para Term Preterm Abortions TAB SAB Ect Mult Living                 Review of Systems  Constitutional: Negative for fever, chills, diaphoresis, appetite change and fatigue.  HENT: Negative for mouth sores, sore throat and trouble swallowing.   Eyes: Negative for visual disturbance.  Respiratory: Negative for cough, chest tightness and wheezing.   Gastrointestinal: Negative for nausea, vomiting, diarrhea and abdominal distention.  Endocrine: Negative for  polydipsia, polyphagia and polyuria.  Genitourinary: Negative for dysuria, urgency, frequency, hematuria and decreased urine volume.  Musculoskeletal: Negative for gait problem.  Skin: Negative for color change, pallor and rash.  Neurological: Positive for weakness. Negative for dizziness, syncope and light-headedness.  Hematological: Does not bruise/bleed easily.  Psychiatric/Behavioral: Negative for behavioral problems and confusion.  All other systems reviewed and are negative.  Allergies  Tramadol hcl and Alendronate sodium  Home Medications   Prior to Admission medications   Medication Sig Start Date End Date Taking? Authorizing Provider  acetaminophen (TYLENOL) 500 MG tablet Take 1,000 mg by mouth daily as needed for mild pain or headache.    Historical Provider, MD  aspirin-acetaminophen-caffeine (EXCEDRIN MIGRAINE) [phone] MG per tablet Take 2 tablets by mouth daily as needed for pain (for migraines).    Historical Provider, MD  b complex vitamins tablet Take 1 tablet by mouth 2 (two) times daily.     Historical Provider, MD  Calcium Carb-Cholecalciferol (CALCIUM 600 + D) 600-200 MG-UNIT TABS Take 1 tablet by mouth 2 (two) times daily.    Historical Provider, MD  Cholecalciferol (VITAMIN D) 2000 UNITS tablet Take 2,000 Units by mouth daily.    Historical Provider, MD  divalproex (DEPAKOTE) 125 MG DR tablet Take 375 mg by mouth 2 (two) times daily.    Historical Provider, MD  Multiple Vitamin (MULTIVITAMIN WITH MINERALS) TABS Take 1 tablet by mouth daily.    Historical Provider, MD  Rivaroxaban (XARELTO) 15 MG TABS tablet Take 15 mg by mouth daily with supper.    Historical Provider, MD   Triage Vitals: BP 154/67  Pulse 74  Temp(Src) 98.4 F (36.9 C) (Oral)  Resp 18  Ht 5\' 7"  (1.702 m)  Wt 120 lb (54.432 kg)  BMI 18.79 kg/m2  SpO2 96% Physical Exam  Constitutional: She is oriented to person, place, and time. She appears well-developed and well-nourished. No distress.   HENT:  Head: Normocephalic.  Mouth/Throat: Oropharynx is clear and moist and mucous membranes are normal. Mucous membranes are not dry.  Eyes: Conjunctivae are normal. Pupils are equal, round, and reactive to light. No scleral icterus.  Neck: Normal range of motion. Neck supple. No mass and no thyromegaly present.  Cardiovascular: Normal rate and regular rhythm.  Exam reveals no gallop and no friction rub.   No murmur heard. Pulmonary/Chest: Effort normal and breath sounds normal. No respiratory distress. She has no wheezes. She has no rales.  Clear lungs  Abdominal: Soft. Bowel sounds are normal. She exhibits no distension. There is no tenderness. There is no rebound.  Musculoskeletal: Normal range of motion.  Neurological: She is alert and oriented to person, place, and time.  5/5 strength to R upper, lower  and L upper 3/5 strength to L lower Lucid, oriented, follows conversation well  Skin: Skin is warm and dry. No rash noted. No pallor.  Psychiatric: She has a normal mood and affect. Her behavior is normal.   ED Course  Procedures (including critical care time) DIAGNOSTIC STUDIES: Oxygen Saturation is 96% on RA, normal by my interpretation.    COORDINATION OF CARE: 7:35 PM Discussed treatment plan with pt at bedside and pt agreed to plan.  Labs Review Labs Reviewed  CBC WITH DIFFERENTIAL - Abnormal; Notable for the following:    Platelets 144 (*)    Monocytes Relative 20 (*)    All other components within normal limits  COMPREHENSIVE METABOLIC PANEL - Abnormal; Notable for the following:    Glucose, Bld 106 (*)    Albumin 3.2 (*)    Total Bilirubin 0.2 (*)    GFR calc non Af Amer 44 (*)    GFR calc Af Amer 51 (*)    All other components within normal limits  URINALYSIS, ROUTINE W REFLEX MICROSCOPIC - Abnormal; Notable for the following:    APPearance CLOUDY (*)    Leukocytes, UA MODERATE (*)    All other components within normal limits  PROTIME-INR - Abnormal; Notable  for the following:    Prothrombin Time 19.7 (*)    INR 1.72 (*)    All other components within normal limits  URINE MICROSCOPIC-ADD ON - Abnormal; Notable for the following:    Squamous Epithelial / LPF FEW (*)    Bacteria, UA FEW (*)    All other components within normal limits  CBG MONITORING, ED - Abnormal; Notable for the following:    Glucose-Capillary 107 (*)    All other components within normal limits  URINE CULTURE   Imaging Review Dg Chest Port 1 View  08/27/2013   CLINICAL DATA:  Left-sided weakness.  EXAM: PORTABLE CHEST - 1 VIEW  COMPARISON:  Chest x-ray 07/31/2013.  FINDINGS: Lung volumes are normal. No consolidative airspace disease. No pleural effusions. No pneumothorax. No pulmonary nodule or mass noted. Pulmonary vasculature and the cardiomediastinal silhouette are within normal limits. Atherosclerosis in the thoracic aorta.  IMPRESSION: 1. No radiographic evidence of acute cardiopulmonary disease. 2. Atherosclerosis.   Electronically Signed   By: Trudie Reed M.D.   On: 08/27/2013 20:33     EKG Interpretation None      MDM   Final diagnoses:  CVA (cerebral infarction)   Patient shows no acute abnormalities. Did not repeat her CT scan and she has not had a significant change in the last 48 hours. She just simply continues to have leg weakness that limits her at home. I feel that she would benefit from stroke evaluation including PT, OT, and speech regarding the recommendations were consideration for short-term rehabilitation hospital. Family is diligently, and includes 3 daughters. I feel that they have been quite diligent to keep their mother at home, up to and including this point.  21:17:  Patient is reevaluated. No acute abnormalities on lab, EKG, chest x-ray. I think that this is a recent extension of a right upper parietal and frontal infarct. She is not performing ADLs without maximum family of systems at home. She has not received full stroke evaluation. I  feel she would benefit from inpatient evaluation, and recommendations for further care. Short term care facility, or improvements in her home therapy regimen.  I personally performed the services described in this documentation, which was scribed in my presence. The recorded  information has been reviewed and is accurate.     Tanna Furry, MD 08/27/13 2114

## 2013-08-27 NOTE — Telephone Encounter (Signed)
Daughter reports pt has inappropriate affect, worsening left leg weakness.  Advised her to bring pt to the ED.

## 2013-08-28 ENCOUNTER — Ambulatory Visit: Payer: Medicare Other | Admitting: Family

## 2013-08-28 ENCOUNTER — Inpatient Hospital Stay (HOSPITAL_COMMUNITY): Payer: Medicare Other

## 2013-08-28 DIAGNOSIS — I4891 Unspecified atrial fibrillation: Secondary | ICD-10-CM

## 2013-08-28 DIAGNOSIS — I6350 Cerebral infarction due to unspecified occlusion or stenosis of unspecified cerebral artery: Secondary | ICD-10-CM | POA: Diagnosis not present

## 2013-08-28 DIAGNOSIS — R509 Fever, unspecified: Secondary | ICD-10-CM | POA: Diagnosis not present

## 2013-08-28 DIAGNOSIS — Z7901 Long term (current) use of anticoagulants: Secondary | ICD-10-CM | POA: Diagnosis not present

## 2013-08-28 DIAGNOSIS — M6281 Muscle weakness (generalized): Secondary | ICD-10-CM

## 2013-08-28 DIAGNOSIS — Z8673 Personal history of transient ischemic attack (TIA), and cerebral infarction without residual deficits: Secondary | ICD-10-CM

## 2013-08-28 DIAGNOSIS — I10 Essential (primary) hypertension: Secondary | ICD-10-CM

## 2013-08-28 LAB — URINE CULTURE: Colony Count: 30000

## 2013-08-28 LAB — LIPID PANEL
CHOL/HDL RATIO: 2.4 ratio
CHOLESTEROL: 166 mg/dL (ref 0–200)
HDL: 68 mg/dL (ref >39)
LDL Cholesterol: 84 mg/dL (ref 0–99)
TRIGLYCERIDES: 68 mg/dL (ref <150)
VLDL: 14 mg/dL (ref 0–40)

## 2013-08-28 LAB — HEMOGLOBIN A1C
Hgb A1c MFr Bld: 5.8 % — ABNORMAL HIGH (ref <5.7)
Mean Plasma Glucose: 120 mg/dL — ABNORMAL HIGH (ref <117)

## 2013-08-28 MED ORDER — ACETAMINOPHEN 500 MG PO TABS
1000.0000 mg | ORAL_TABLET | Freq: Every day | ORAL | Status: DC | PRN
  Administered 2013-08-28 – 2013-08-29 (×2): 1000 mg via ORAL
  Filled 2013-08-28 (×3): qty 2

## 2013-08-28 MED ORDER — SENNOSIDES-DOCUSATE SODIUM 8.6-50 MG PO TABS: 1.0000 | ORAL_TABLET | Freq: Every evening | ORAL | Status: DC | PRN

## 2013-08-28 MED ORDER — RIVAROXABAN 15 MG PO TABS
15.0000 mg | ORAL_TABLET | Freq: Every day | ORAL | Status: DC
  Administered 2013-08-28: 15 mg via ORAL
  Filled 2013-08-28 (×2): qty 1

## 2013-08-28 MED ORDER — DEXTROSE 5 % IV SOLN
1.0000 g | Freq: Every day | INTRAVENOUS | Status: DC
  Administered 2013-08-28 – 2013-08-29 (×3): 1 g via INTRAVENOUS
  Filled 2013-08-28 (×4): qty 10

## 2013-08-28 MED ORDER — DIVALPROEX SODIUM 250 MG PO DR TAB
375.0000 mg | DELAYED_RELEASE_TABLET | Freq: Two times a day (BID) | ORAL | Status: DC
  Administered 2013-08-28 – 2013-08-30 (×6): 375 mg via ORAL
  Filled 2013-08-28 (×8): qty 1

## 2013-08-28 NOTE — H&P (Addendum)
[facility] History and Physical  DUTCHESS CROSLAND GEX:[id number] DOB: [birth year] DOA: 08/27/2013  Referring physician:  PCP: [name], NP   Chief Complaint: Left-sided weakness  HPI: [name] is a 80 y.o. female with a past medical history of atrial fibrillation, history of CVA in May of 2014, at which time she presented with slurred speech and left-sided weakness, MRI of the brain performed on 08/01/2012 showed acute nonhemorrhagic infarct extending from the lateral aspect of the right lenticular nucleus superiorly into the mid aspect of the right corona radiata, presented as a transfer from [place]. Patient was seen by her primary care provider about 3 weeks ago for increasing extremity weakness. Outpatient MRI was performed on 08/11/2013 which showed an acute infarct involving the right corpus callosum and right frontal white matter. It appears that patient was not hospitalized at this time as she was continued on anticoagulation with Xarelto. Patient continued to have left-sided weakness, having a significant clinical decline over the last several days, now requiring 2 person assist to get out of bed. On MRI of brain was performed on 08/24/2013 which showed slight increase in size of right anterior cerebral artery distribution infarct when compared to prior exam. Family members concerned, having difficulties caring for her at home. I discussed case with Dr. Leonel Ramsay of neurology. Limited treatment options at this point, recommending continuing anticoagulation and physical therapy.                                                                                  Review of Systems:  Constitutional:  No weight loss, night sweats, Fevers, chills, fatigue.  HEENT:  No headaches, Difficulty swallowing,Tooth/dental problems,Sore throat,  No sneezing, itching, ear ache, nasal congestion, post nasal drip,  Cardio-vascular:  No chest pain, Orthopnea, PND, swelling in  lower extremities, anasarca, dizziness, palpitations  GI:  No heartburn, indigestion, abdominal pain, nausea, vomiting, diarrhea, change in bowel habits, loss of appetite  Resp:  No shortness of breath with exertion or at rest. No excess mucus, no productive cough, No non-productive cough, No coughing up of blood.No change in color of mucus.No wheezing.No chest wall deformity  Skin:  no rash or lesions.  GU:  no dysuria, change in color of urine, no urgency or frequency. No flank pain.  Musculoskeletal:  No joint pain or swelling. No decreased range of motion. No back pain.  Psych:  No change in mood or affect. No depression or anxiety. No memory loss.   Past Medical History  Diagnosis Date  . Arthritis   . Hypertension   . Migraine   . Colon polyp   . Hearing difficulty   . Torus palatinus   . Atrial flutter January, 2012  . Stroke 08/02/12     right lenticular nucleus infarct  . Left leg weakness 02/05/2013  . Chronic anticoagulation   . High cholesterol   . Atrial fibrillation    Past Surgical History  Procedure Laterality Date  . Cataract extraction w/ intraocular lens  implant, bilateral  2006-2008  . Ganglion cyst excision Bilateral 1938,1954,2003,2005    "wrists/hand" (08/01/2012)  . Cardioversion  05/19/2010    Dr. Einar Gip  .  Tonsillectomy  ~ 1935  . Appendectomy  02/19/53    `  . Mouth surgery      [name]   Social History:  reports that she has quit smoking. She has never used smokeless tobacco. She reports that she drinks alcohol. She reports that she does not use illicit drugs.  Allergies  Allergen Reactions  . Tramadol Hcl Other (See Comments)     lethargy, nausea  . Alendronate Sodium Rash and Other (See Comments)    puffy eyes    Family History  Problem Relation Age of Onset  . Colon cancer    . Breast cancer    . Brain cancer    . Cancer Mother     breast  . Aneurysm Mother     brain  . Heart attack Neg Hx   . Diabetes Neg Hx   . Hypertension Neg  Hx      Prior to Admission medications   Medication Sig Start Date End Date Taking? Authorizing Provider  acetaminophen (TYLENOL) 500 MG tablet Take 1,000 mg by mouth daily as needed for mild pain or headache.    Historical Provider, MD  aspirin-acetaminophen-caffeine (EXCEDRIN MIGRAINE) [phone] MG per tablet Take 2 tablets by mouth daily as needed for pain (for migraines).    Historical Provider, MD  b complex vitamins tablet Take 1 tablet by mouth 2 (two) times daily.     Historical Provider, MD  Calcium Carb-Cholecalciferol (CALCIUM 600 + D) 600-200 MG-UNIT TABS Take 1 tablet by mouth 2 (two) times daily.    Historical Provider, MD  Cholecalciferol (VITAMIN D) 2000 UNITS tablet Take 2,000 Units by mouth daily.    Historical Provider, MD  divalproex (DEPAKOTE) 125 MG DR tablet Take 375 mg by mouth 2 (two) times daily.    Historical Provider, MD  Multiple Vitamin (MULTIVITAMIN WITH MINERALS) TABS Take 1 tablet by mouth daily.    Historical Provider, MD  Rivaroxaban (XARELTO) 15 MG TABS tablet Take 15 mg by mouth daily with supper.    Historical Provider, MD   Physical Exam: Filed Vitals:   08/27/13 2305  BP: 166/84  Pulse: 67  Temp: 97.6 F (36.4 C)  Resp: 16    BP 166/84  Pulse 67  Temp(Src) 97.6 F (36.4 C) (Oral)  Resp 16  Ht 5\' 7"  (1.702 m)  Wt 53.887 kg (118 lb 12.8 oz)  BMI 18.60 kg/m2  SpO2 99%  General:  Appears calm and comfortable, in no acute distress. Mildly confused Eyes: PERRL, normal lids, irises & conjunctiva ENT: grossly normal hearing, lips & tongue Neck: no LAD, masses or thyromegaly Cardiovascular: Irregular rate and rhythm, no m/r/g. No LE edema. Telemetry: SR, no arrhythmias  Respiratory: CTA bilaterally, no w/r/r. Normal respiratory effort. Abdomen: soft, ntnd Skin: no rash or induration seen on limited exam Musculoskeletal: grossly normal tone BUE/BLE Psychiatric: grossly normal mood and affect, speech fluent and appropriate Neurologic:  Negative for slurred speech, facial droop, tongue deviation. Cranial nerves II through XII were grossly intact, has 5 of 5 muscle strength to bilateral upper extremities, 4-5 muscle strength to left lower extremity, 5 of 5 muscle strength to right lower extremity. Patient reporting decreased sensation to her left lower extremity. 1+ bilateral deep tendon reflexes           Labs on Admission:  Basic Metabolic Panel:  Recent Labs Lab 08/24/13 1542 08/27/13 1944  NA 145 146  K 4.5 5.3  CL 106 105  CO2 31 31  GLUCOSE 95  106*  BUN 18 22  CREATININE 0.79 1.10  CALCIUM 9.1 9.6   Liver Function Tests:  Recent Labs Lab 08/24/13 1542 08/27/13 1944  AST 30 22  ALT 16 12  ALKPHOS 68 70  BILITOT 0.2* 0.2*  PROT 6.3 6.2  ALBUMIN 3.2* 3.2*   No results found for this basename: LIPASE, AMYLASE,  in the last 168 hours No results found for this basename: AMMONIA,  in the last 168 hours CBC:  Recent Labs Lab 08/24/13 1542 08/27/13 1944  WBC 3.9* 4.8  NEUTROABS  --  2.2  HGB 12.7 12.3  HCT 38.8 37.8  MCV 95.1 96.9  PLT 169 144*   Cardiac Enzymes:  Recent Labs Lab 08/24/13 1929  TROPONINI <0.30    BNP (last 3 results) No results found for this basename: PROBNP,  in the last 8760 hours CBG:  Recent Labs Lab 08/27/13 1823  GLUCAP 107*    Radiological Exams on Admission: Dg Chest Port 1 View  08/27/2013   CLINICAL DATA:  Left-sided weakness.  EXAM: PORTABLE CHEST - 1 VIEW  COMPARISON:  Chest x-ray 07/31/2013.  FINDINGS: Lung volumes are normal. No consolidative airspace disease. No pleural effusions. No pneumothorax. No pulmonary nodule or mass noted. Pulmonary vasculature and the cardiomediastinal silhouette are within normal limits. Atherosclerosis in the thoracic aorta.  IMPRESSION: 1. No radiographic evidence of acute cardiopulmonary disease. 2. Atherosclerosis.   Electronically Signed   By: Vinnie Langton M.D.   On: 08/27/2013 20:33    EKG: Independently reviewed.  Normal sinus rhythm  Assessment/Plan Active Problems:   CVA (cerebral infarction)   Acute CVA (cerebrovascular accident)   Atrial fibrillation   Left-sided weakness   HTN (hypertension)   Chronic anticoagulation   1.  CVA. Patient with history of CVA, initially presenting back in May of 2014 with slurred speech and left-sided weakness, undergoing CVA workup which included MRI of brain that showed an acute nonhemorrhagic infarct. At the time she was discharged on anticoagulation with Xarelto. Because of increasing left lower extremity weakness she had an MRI done on 08/11/2013 which showed an acute infarct involving the right corpus callosum and right frontal white matter. Repeat MRI showing a slight increase in size of right anterior cerebral artery distribution infarct. I discussed case with Dr. Leonel Ramsay of neurology who did not recommend medication changes with a continuation of Xarelto. Her last transthoracic echocardiogram and Carotid Dopplers were performed on 02/06/2013. Physical therapy, occupational therapy and speech pathology consult.  2.  History of atrial fibrillation. EKG showing sinus rhythm. Continue anticoagulation with Xarelto  3.  Hypertension. She presents with systolic blood pressures in the 160s. Will start Norvasc 5 mg by mouth daily  4.  dyslipidemia. Will check fasting lipid panel  5. Possible UTI. UA appearing cloudy showing presence of leukocytes and bacteria. Will treat emperically with rocephin.   5. DVT prophylaxis. Patient anticoagulated with Xarelto      Code Status: Per medical records patient is a Full Code, family members not available to discuss code status.  Family Communication: Family not available Disposition Plan: Anticipate she may require greater than 2 nights hospitalization  Time spent: 70 min  [facility] Pager [phone]  **Disclaimer: This note may have been dictated with voice recognition software. Similar  sounding words can inadvertently be transcribed and this note may contain transcription errors which may not have been corrected upon publication of note.**

## 2013-08-28 NOTE — Progress Notes (Signed)
INITIAL NUTRITION ASSESSMENT  DOCUMENTATION CODES Per approved criteria  -Not Applicable   INTERVENTION: Recommend liberalizing diet to Regular Provide Magic Cup BID with meals RD to continue to monitor  NUTRITION DIAGNOSIS: Predicted suboptimal energy intake related to poor appetite as evidenced by pt's report and use of supplements at home for weight maintenance.   Goal: Pt to meet >/= 90% of their estimated nutrition needs   Monitor:  PO intake, weight, labs, diet order  Reason for Assessment: Malnutrition Screening Tool  80 y.o. female  Admitting Dx: <principal problem not specified>  ASSESSMENT: 80 y.o. female with a past medical history of atrial fibrillation, history of CVA in May of 2014, at which time she presented with slurred speech and left-sided weakness, MRI of the brain performed on 08/01/2012 showed acute nonhemorrhagic infarct extending from the lateral aspect of the right lenticular nucleus superiorly into the mid aspect of the right corona radiata, presented as a transfer from [place].   Pt quiet at time of visit. Pt's daughter at bedside assisted in providing history. Per daughter, after pt broke her shoulder several months ago pt began to eat very poorly and lost a lot of weight. Since, family has encouraged intake of high calorie foods as well as protein shakes and protein puddings to help pt gain a few pounds. Family requesting Regular diet as pt is unable to eat several foods from the menu that she likes due to current diet restrictions.   Nutrition Focused Physical Exam:  Subcutaneous Fat:  Orbital Region: wnl Upper Arm Region: mild wasting Thoracic and Lumbar Region: NA  Muscle:  Temple Region: moderate.severe wasting Clavicle Bone Region: moderate wasting Clavicle and Acromion Bone Region: mild wasting Scapular Bone Region: NA Dorsal Hand: mild wasting Patellar Region: mild wasting Anterior Thigh Region: moderate wasting Posterior  Calf Region: wnl  Edema: none noted   Height: Ht Readings from Last 1 Encounters:  08/27/13 5\' 7"  (1.702 m)    Weight: Wt Readings from Last 1 Encounters:  08/27/13 118 lb 12.8 oz (53.887 kg)    Ideal Body Weight: 135 lbs  % Ideal Body Weight: 87%  Wt Readings from Last 10 Encounters:  08/27/13 118 lb 12.8 oz (53.887 kg)  08/07/13 118 lb (53.524 kg)  07/31/13 116 lb (52.617 kg)  06/01/13 116 lb (52.617 kg)  05/31/13 116 lb (52.617 kg)  04/16/13 118 lb (53.524 kg)  03/09/13 113 lb (51.256 kg)  02/28/13 115 lb 6.4 oz (52.345 kg)  02/12/13 121 lb (54.885 kg)  02/05/13 121 lb (54.885 kg)    Usual Body Weight: 120 lbs  % Usual Body Weight: 98%  BMI:  Body mass index is 18.6 kg/(m^2).  Estimated Nutritional Needs: Kcal: 1500-1700 Protein: 60-70 grams Fluid: 1.5-1.7 L/day  Skin: intact  Diet Order: Cardiac  EDUCATION NEEDS: -No education needs identified at this time  No intake or output data in the 24 hours ending 08/28/13 1145  Last BM: 6/1  Labs:   Recent Labs Lab 08/24/13 1542 08/27/13 1944  NA 145 146  K 4.5 5.3  CL 106 105  CO2 31 31  BUN 18 22  CREATININE 0.79 1.10  CALCIUM 9.1 9.6  GLUCOSE 95 106*    CBG (last 3)   Recent Labs  08/27/13 1823  GLUCAP 107*    Scheduled Meds: . cefTRIAXone (ROCEPHIN)  IV  1 g Intravenous QHS  . divalproex  375 mg Oral BID  . Rivaroxaban  15 mg Oral Q supper  Continuous Infusions:   Past Medical History  Diagnosis Date  . Arthritis   . Hypertension   . Migraine   . Colon polyp   . Hearing difficulty   . Torus palatinus   . Atrial flutter January, 2012  . Stroke 08/02/12     right lenticular nucleus infarct  . Left leg weakness 02/05/2013  . Chronic anticoagulation   . High cholesterol   . Atrial fibrillation     Past Surgical History  Procedure Laterality Date  . Cataract extraction w/ intraocular lens  implant, bilateral  2006-2008  . Ganglion cyst excision Bilateral  1938,1954,2003,2005    "wrists/hand" (08/01/2012)  . Cardioversion  05/19/2010    Dr. Einar Gip  . Tonsillectomy  ~ 1935  . Appendectomy  02/19/53    `  . Mouth surgery      Rich Brave RD, LDN Inpatient Clinical Dietitian Pager: [phone] After Hours Pager: [phone]

## 2013-08-28 NOTE — Progress Notes (Signed)
Patient with previous history of stroke on chronic anticoagulation admitted after midnight worsening of leg weakness symptoms. Seen after arrival to floor.  Overall doing okay. Feels weak.  MRI confirmed no acute stroke. New symptoms looked to be from progression of old stroke as expected.  Assessment and plan: History of stroke with evolving weakness: Neurology signed off. Patient on Xarelto currently. Case management speaking with insurance company to get approval for change to eliquis.  Physical therapy, occupational therapy and speech therapy to see 6/3: Based on the recommendations, home with home health versus short-term skilled  Urinalysis unremarkable. No acute UTI.  Time spent: 20 minutes

## 2013-08-28 NOTE — Progress Notes (Signed)
Stroke Team Progress Note  HISTORY [name] is a 80 y.o. female with a history of multiple strokes of the past year, in May 2014, then December 2014 while on xarelto and that time she was changed to elequis. I'm not sure why but at some point following this she was changed back to xarelto. On MR angiogram in November of 2014, she had severe high-grade stenosis of the right ACA.  She has had recurrent episodes of her left leg feeling like it is giving out. Since her stroke several weeks ago, she is needing 2 people to help her get around because of her left leg weakness. She feels like it'll give out at any moment.  She presented 2 days ago to the emergency room where an MRI was performed which did not show any new areas of infarction, but very mild extension of her previous infarction that occurred several weeks ago. She was already on anticoagulation and in physical therapy and was discharged from the emergency department. Her daughters feel that they can no longer care for her at home and brought her back to the emergency room. She has since been admitted.  ROS: A 14 point ROS was performed and is negative except as noted in the HPI.   Patient was not administered TPA secondary to recent stroke, no acute stroke per MRI. She was admitted as her daughters are no longer able to provide care.   SUBJECTIVE Her daughter is at the bedside.  Overall she feels her condition is stable.   OBJECTIVE Most recent Vital Signs: Filed Vitals:   08/28/13 0104 08/28/13 0239 08/28/13 0641 08/28/13 0841  BP: 163/61 166/84 176/84 133/51  Pulse: 68 80 68 67  Temp: 97.8 F (36.6 C) 97.4 F (36.3 C) 97.7 F (36.5 C) 98.1 F (36.7 C)  TempSrc: Oral Oral Oral Oral  Resp: 16 17 18 18   Height:      Weight:      SpO2: 98% 97% 98% 96%   CBG (last 3)   Recent Labs  08/27/13 1823  GLUCAP 107*    IV Fluid Intake:     MEDICATIONS  . cefTRIAXone (ROCEPHIN)  IV  1 g Intravenous QHS  . divalproex  375 mg  Oral BID  . Rivaroxaban  15 mg Oral Q supper   PRN:  acetaminophen, senna-docusate  CLINICALLY SIGNIFICANT STUDIES Basic Metabolic Panel:   Recent Labs Lab 08/24/13 1542 08/27/13 1944  NA 145 146  K 4.5 5.3  CL 106 105  CO2 31 31  GLUCOSE 95 106*  BUN 18 22  CREATININE 0.79 1.10  CALCIUM 9.1 9.6   Liver Function Tests:   Recent Labs Lab 08/24/13 1542 08/27/13 1944  AST 30 22  ALT 16 12  ALKPHOS 68 70  BILITOT 0.2* 0.2*  PROT 6.3 6.2  ALBUMIN 3.2* 3.2*   CBC:   Recent Labs Lab 08/24/13 1542 08/27/13 1944  WBC 3.9* 4.8  NEUTROABS  --  2.2  HGB 12.7 12.3  HCT 38.8 37.8  MCV 95.1 96.9  PLT 169 144*   Coagulation:   Recent Labs Lab 08/24/13 1929 08/27/13 1944  LABPROT 13.8 19.7*  INR 1.08 1.72*   Cardiac Enzymes:   Recent Labs Lab 08/24/13 1929  TROPONINI <0.30   Urinalysis:   Recent Labs Lab 08/24/13 2223 08/27/13 2012  COLORURINE YELLOW YELLOW  LABSPEC 1.015 1.020  PHURINE 7.0 6.5  GLUCOSEU NEGATIVE NEGATIVE  HGBUR TRACE* NEGATIVE  BILIRUBINUR NEGATIVE NEGATIVE  KETONESUR NEGATIVE NEGATIVE  PROTEINUR NEGATIVE NEGATIVE  UROBILINOGEN 0.2 0.2  NITRITE NEGATIVE NEGATIVE  LEUKOCYTESUR SMALL* MODERATE*   Lipid Panel    Component Value Date/Time   CHOL 166 08/28/2013 0716   TRIG 68 08/28/2013 0716   HDL 68 08/28/2013 0716   CHOLHDL 2.4 08/28/2013 0716   VLDL 14 08/28/2013 0716   LDLCALC 84 08/28/2013 0716   HgbA1C  Lab Results  Component Value Date   HGBA1C 5.9* 02/12/2013    Urine Drug Screen:     Component Value Date/Time   LABOPIA NONE DETECTED 02/12/2013 1046   COCAINSCRNUR NONE DETECTED 02/12/2013 1046   LABBENZ NONE DETECTED 02/12/2013 1046   AMPHETMU NONE DETECTED 02/12/2013 1046   THCU NONE DETECTED 02/12/2013 1046   LABBARB NONE DETECTED 02/12/2013 1046    Alcohol Level: No results found for this basename: ETH,  in the last 168 hours   MR Lumbar Spine Wo Contrast  08/24/2013   No significant change from the prior  examination with most notable findings at the L3-4 level where there is multifactorial moderate to marked spinal stenosis and lateral recess stenosis.     CT of the brain   08/24/2013    No acute intracranial abnormality.  Atrophy and evidence for old ischemic changes. Subtle low-density in the region of the right corpus callosum probably related to the recent infarct.   07/31/2013    Atrophy and chronic microvascular ischemia.  No acute abnormality.   MRI of the brain   08/24/2013   Slight increase in size of right anterior cerebral artery distribution acute infarct (medial aspect the right frontal lobe) when compared to the 08/11/2013 exam.  Remote infarcts and small vessel disease type changes as detailed above.    08/11/2013    Acute infarct involving the right corpus callosum and right frontal white matter.  Atrophy and extensive chronic ischemia.     MRA of the brain  02/06/2013 . Suggestion of new distal right ACA hemodynamically significant stenosis, at the callosomarginal artery level, with preserved distal flow. Otherwise stable intracranial MRA since 08/01/2012, with overall moderate medium-sized vessel irregularity compatible with atherosclerosis.  2D Echocardiogram  02/06/2013 EF 55-60% with no source of embolus. Patient with h/o chronic atrial flutter, and hence this could be the cardiac source of cerebral embolism. Although no thrombus visualized in the LA, consider TEE if clinically indicated.  Carotid Doppler  02/06/2013 No evidence of hemodynamically significant internal carotid artery stenosis. Vertebral artery flow is antegrade.   CXR   08/27/2013    1. No radiographic evidence of acute cardiopulmonary disease. 2. Atherosclerosis.    07/31/2013    No active cardiopulmonary disease.     EKG  normal sinus rhythm. For complete results please see formal report.   Therapy Recommendations no needs   Physical Exam   Frail elderly Caucasian lady currently not in distress.Awake alert.  Afebrile. Head is nontraumatic. Neck is supple without bruit. Hearing is normal. Cardiac exam no murmur or gallop. Lungs are clear to auscultation. Distal pulses are well felt. Neurological Exam : Awake alert oriented x2. No dysarthria or aphasia. Fundi were not visualized. Vision acuity seems adequate. Face is symmetric without weakness. Tongue is midline. Motor system exam reveals no upper extremity drift. Mild left lower extremity drift. Mild weakness of left hip flexors and ankle dorsiflexors. No upper extremity weakness. Diminished fine finger movements on the left and orbits right over left approximately. Sensation appears preserved. The DTRs are 1+ symmetric. Plantars are downgoing. Gait was not tested. ASSESSMENT  [name] is a 80 y.o. female presenting with leg weakness. Imaging confirms no new stroke; there has been normal progression of previous right ACA stroke. On xarelto prior to admission. Had previously been changed to eliquis, but daughter reports no one is able to tell them if her company will be able to cover - she would like this qualified prior to changing medication, therefore, xarelto had continued. Now on xarelto for secondary stroke prevention. No indication for further stroke workup.  Atrial Flutter; atrial fibrillation  Hypertension Hyperlipidemia, LDL 84, on no statin PTA Migraine Hx stroke, right lenticular nulceas 07/2012  Possible UTI   Hospital day # 1  TREATMENT/PLAN  Continue xarelto for secondary stroke prevention for now. If insurance company will cover, change to eliquis  Nothing further to add from the stroke standpoint  Will signoff, please call for any questions or needs.  SIGNED Burnetta Sabin, MSN, RN, ANVP-BC, ANP-BC, GNP-BC [facility] Pager: [phone] 08/28/2013 4:31 PM   I have personally obtained a history, examined the patient, evaluated imaging results, and formulated the assessment and plan of care. I agree with the  above. Antony Contras, MD   To contact Stroke Continuity provider, please refer to [email or web address] After hours, contact General Neurology

## 2013-08-28 NOTE — Progress Notes (Signed)
CARE MANAGEMENT NOTE 08/28/2013  Patient:  [name], [name]   Account Number:  [id number]  Date Initiated:  08/28/2013  Documentation initiated by:  Olga Coaster  Subjective/Objective Assessment:   ADMITTED WITH WEAKNESS     Action/Plan:   CM FOLLOWED BY DCP   Anticipated DC Date:  09/01/2013   Anticipated DC Plan:  SKILLED NURSING FACILITY  In-house referral  Clinical Social Worker      DC Planning Services  CM consult      Status of service:  In process, will continue to follow Medicare Important Message given?   (If response is "NO", the following Medicare IM given date fields will be blank)  Per UR Regulation:  Reviewed for med. necessity/level of care/duration of stay  Comments:  6/2/2015Mindi Slicker RN,BSN,MHA [phone]

## 2013-08-28 NOTE — Progress Notes (Signed)
BENEFIT CHECK --08/28/2013 1435 by [facility]- per rep at [employer]:  eliquis: auth required [phone]/ tier 3/ co-pay $35.00 retail/ mail order 90 day $100

## 2013-08-28 NOTE — Progress Notes (Signed)
Cm consult  Benefit check in progress for Eliquis - patient has Medicare and [employer] PLAN F     Payer: [place]  Payor ID: [id number]  Address: [address] [place] [address] [place] [address]

## 2013-08-28 NOTE — Consult Note (Signed)
Neurology Consultation Reason for Consult: Leg weakness Referring Physician: Coralyn Pear, E  CC: Leg weakness  History is obtained from: Patient  HPI: [name] is a 79 y.o. female with a history of multiple strokes of the past year, in May 2014, then December 2014 while on xarelto and that time she was changed to elequis. I'm not sure why but at some point following this she was changed back to xarelto. On MR angiogram in November of 2014, she had severe high-grade stenosis of the right ACA.   She has had recurrent episodes of her left leg feeling like it is giving out. Since her stroke several weeks ago, she is needing 2 people to help her get around because of her left leg weakness. She feels like it'll give out at any moment.   She presented 2 days ago to the emergency room where an MRI was performed which did not show any new areas of infarction, but very mild extension of her previous infarction that occurred several weeks ago. She was already on anticoagulation and in physical therapy and was discharged from the emergency department. Her daughters feel that they can no longer care for her at home and brought her back to the emergency room. She has since been admitted.  ROS: A 14 point ROS was performed and is negative except as noted in the HPI.  Past Medical History  Diagnosis Date  . Arthritis   . Hypertension   . Migraine   . Colon polyp   . Hearing difficulty   . Torus palatinus   . Atrial flutter January, 2012  . Stroke 08/02/12     right lenticular nucleus infarct  . Left leg weakness 02/05/2013  . Chronic anticoagulation   . High cholesterol   . Atrial fibrillation     Family History: Mother-breast cancer  Social History: Tob: Denies  Exam: Current vital signs: BP 166/84  Pulse 80  Temp(Src) 97.4 F (36.3 C) (Oral)  Resp 17  Ht 5\' 7"  (1.702 m)  Wt 53.887 kg (118 lb 12.8 oz)  BMI 18.60 kg/m2  SpO2 97% Vital signs in last 24 hours: Temp:  [97.3 F (36.3  C)-98.4 F (36.9 C)] 97.4 F (36.3 C) (06/02 0239) Pulse Rate:  [64-80] 80 (06/02 0239) Resp:  [16-18] 17 (06/02 0239) BP: (142-173)/(61-84) 166/84 mmHg (06/02 0239) SpO2:  [96 %-99 %] 97 % (06/02 0239) Weight:  [53.887 kg (118 lb 12.8 oz)-54.432 kg (120 lb)] 53.887 kg (118 lb 12.8 oz) (06/01 2305)  General: In bed, NAD CV: Regular rate and rhythm Mental Status: Patient is awake, alert, oriented to person, place, month, year, and situation. Immediate and remote memory are intact. Patient is able to give a clear and coherent history. No signs of aphasia or neglect Cranial Nerves: II: Visual Fields are full. Pupils are equal, round, and reactive to light.  Discs are difficult to visualize. III,IV, VI: EOMI without ptosis or diploplia.  V: Facial sensation is symmetric to temperature VII: Facial movement is symmetric.  VIII: hearing is intact to voice X: Uvula elevates symmetrically XI: Shoulder shrug is symmetric. XII: tongue is midline without atrophy or fasciculations.  Motor: Tone is normal. Bulk is normal. 5/5 strength was present on the right, on the left she 4/5 weakness of the left arm as well as throughout the left leg. Sensory: Sensation is symmetric to light touch and temperature in the arms and legs. Deep Tendon Reflexes: 1+ and symmetric in the biceps and patellae.  Plantars:  Toes are downgoing bilaterally.  Cerebellar: FNF with some tremor bilaterally, left greater than right Gait: Not performed secondary to patient safety concerns   I have reviewed labs in epic and the results pertinent to this consultation are: CMP-unremarkable other than mildly low GFR  I have reviewed the images obtained: MRI brain-ACA territory infarction in the region of a previously seen his a stenosis  Impression: 80 year old female with a history of multiple infarcts over the past year in the setting of xarelto use. In this setting, I wonder if it is reasonable to change her to a  different anticoagulant, though with a history of stenosis in that region atherosclerotic disease is likely playing a role.  Recommendations: 1. HgbA1c, fasting lipid panel 2. MRA head 3. Frequent neuro checks 4. Prophylactic therapy-Continue xarelto, could consider change to elequis   5. Risk factor modification 6. Telemetry monitoring 7. PT consult, OT consult, Speech consult    Roland Rack, MD [facility] [phone]  If 7pm- 7am, please page neurology on call as listed in [place].

## 2013-08-29 ENCOUNTER — Encounter (HOSPITAL_COMMUNITY): Payer: Self-pay | Admitting: *Deleted

## 2013-08-29 ENCOUNTER — Inpatient Hospital Stay (HOSPITAL_COMMUNITY): Payer: Medicare Other

## 2013-08-29 DIAGNOSIS — I672 Cerebral atherosclerosis: Secondary | ICD-10-CM | POA: Diagnosis not present

## 2013-08-29 DIAGNOSIS — I6350 Cerebral infarction due to unspecified occlusion or stenosis of unspecified cerebral artery: Secondary | ICD-10-CM | POA: Diagnosis not present

## 2013-08-29 LAB — CBC
HEMATOCRIT: 34.8 % — AB (ref 36.0–46.0)
Hemoglobin: 11.7 g/dL — ABNORMAL LOW (ref 12.0–15.0)
MCH: 31.5 pg (ref 26.0–34.0)
MCHC: 33.6 g/dL (ref 30.0–36.0)
MCV: 93.5 fL (ref 78.0–100.0)
PLATELETS: 163 10*3/uL (ref 150–400)
RBC: 3.72 MIL/uL — ABNORMAL LOW (ref 3.87–5.11)
RDW: 15 % (ref 11.5–15.5)
WBC: 4.3 10*3/uL (ref 4.0–10.5)

## 2013-08-29 LAB — BASIC METABOLIC PANEL
BUN: 20 mg/dL (ref 6–23)
CO2: 29 mEq/L (ref 19–32)
CREATININE: 0.95 mg/dL (ref 0.50–1.10)
Calcium: 8.5 mg/dL (ref 8.4–10.5)
Chloride: 107 mEq/L (ref 96–112)
GFR calc Af Amer: 61 mL/min — ABNORMAL LOW (ref >90)
GFR calc non Af Amer: 53 mL/min — ABNORMAL LOW (ref >90)
Glucose, Bld: 81 mg/dL (ref 70–99)
Potassium: 4.3 mEq/L (ref 3.7–5.3)
Sodium: 145 mEq/L (ref 137–147)

## 2013-08-29 MED ORDER — KETOROLAC TROMETHAMINE 30 MG/ML IJ SOLN
30.0000 mg | Freq: Once | INTRAMUSCULAR | Status: AC
  Administered 2013-08-29: 30 mg via INTRAVENOUS
  Filled 2013-08-29: qty 1

## 2013-08-29 MED ORDER — APIXABAN 2.5 MG PO TABS
2.5000 mg | ORAL_TABLET | Freq: Two times a day (BID) | ORAL | Status: DC
  Administered 2013-08-29 – 2013-08-30 (×2): 2.5 mg via ORAL
  Filled 2013-08-29 (×3): qty 1

## 2013-08-29 MED ORDER — DIPHENHYDRAMINE HCL 50 MG/ML IJ SOLN
25.0000 mg | Freq: Once | INTRAMUSCULAR | Status: AC
  Administered 2013-08-29: 25 mg via INTRAVENOUS
  Filled 2013-08-29: qty 1

## 2013-08-29 MED ORDER — ACETAMINOPHEN 500 MG PO TABS
1000.0000 mg | ORAL_TABLET | Freq: Three times a day (TID) | ORAL | Status: DC | PRN
  Administered 2013-08-30: 1000 mg via ORAL
  Filled 2013-08-29: qty 2

## 2013-08-29 MED ORDER — METOCLOPRAMIDE HCL 5 MG/ML IJ SOLN
10.0000 mg | Freq: Once | INTRAMUSCULAR | Status: AC
  Administered 2013-08-29: 10 mg via INTRAVENOUS
  Filled 2013-08-29: qty 2

## 2013-08-29 NOTE — Progress Notes (Signed)
Patient was up in chair in room for a while and then transferred back to bed. Reddened blanchable area noted to her left buttock cheek. Barrier cream applied and repositioned.

## 2013-08-29 NOTE — Evaluation (Signed)
Physical Therapy Evaluation Patient Details Name: MESA JANUS MRN: [id number] DOB: September 02, 1924 Today's Date: 08/29/2013   History of Present Illness  Pt is an 80 y.o. Female with PMH of CVA (March 2014) with residual Left sided weakness who was admitted 08/27/13 for worsening Left sided weakness.  MRI of the brain performed on 08/01/2012 showed acute nonhemorrhagic infarct extending from the lateral aspect of the right lenticular nucleus superiorly into the mid aspect of the right corona radiata, presented as a transfer from [place]. Patient was seen by her primary care provider about 3 weeks ago for increasing extremity weakness. Outpatient MRI was performed on 08/11/2013 which showed an acute infarct involving the right corpus callosum and right frontal white matter. Pt was not hospitalized at this time, however continued to have left-sided weakness, with a significant clinical decline over the last several days, now requiring 2 person assist to get out of bed. MRI of brain was performed on 08/24/2013 which showed slight increase in size of right anterior cerebral artery distribution infarct when compared to prior exam.  Clinical Impression  Pt adm from home due to the above. Pt presents with significant decline in functional mobility secondary to deficits indicated below. Pt to benefit from skilled acute PT to address deficits and maximize functional mobility. Family/daughters very supportive and provide care for pt. Discussed recommendations of SNF at this time due to decline in functional mobility, family agreeable at this time.     Follow Up Recommendations SNF;Other (comment) (SNF )    Equipment Recommendations  Hospital bed (if pt D/C home)    Recommendations for Other Services       Precautions / Restrictions Precautions Precautions: Fall Precaution Comments: Lt LE weakness Restrictions Weight Bearing Restrictions: No      Mobility  Bed Mobility Overal bed mobility:  Needs Assistance Bed Mobility: Supine to Sit;Sit to Supine     Supine to sit: Mod assist;HOB elevated Sit to supine: Mod assist   General bed mobility comments: (A) to bring LEs to/off EOB and elevate trunk; incr time for motor planning and has incr difficulty with advancement of Lt LE; max cues for hand placement and sequencing   Transfers Overall transfer level: Needs assistance Equipment used: 1 person hand held assist Transfers: Sit to/from Stand Sit to Stand: Max assist Stand pivot transfers: Mod assist;+2 safety/equipment       General transfer comment: performed sit to stand from bedside x 2; pt very fearful of falling and thrusing posteriorly against (A); cues for upright posture and sequencing; blocked Lt LE however, no buckling noted and pt able to WB through Lt LE with standing; tolerated standing for 20 seconds at most at one time  Ambulation/Gait             General Gait Details: will require 2 person to assess   Stairs            Wheelchair Mobility    Modified Rankin (Stroke Patients Only)       Balance Overall balance assessment: Needs assistance;History of Falls Sitting-balance support: Feet supported;Bilateral upper extremity supported Sitting balance-Leahy Scale: Poor Sitting balance - Comments: worked on core and postural muscle training at EOB; cues for upright posture throughout; pt at supervision level; fatigues and begins to have kyphotic posture but is correctable with multimodal cues; tolerated sitting EOB ~8 min for activiites; reaching out BOS with bil UEs; no LOB  Postural control: Posterior lean;Right lateral lean Standing balance support: During functional activity;Single  extremity supported Standing balance-Leahy Scale: Zero Standing balance comment: requries max (A) to maintain balance                             Pertinent Vitals/Pain No c/o pain today    Home Living Family/patient expects to be discharged to::  Skilled nursing facility Living Arrangements: Children Available Help at Discharge: Family;Available 24 hours/day;Available PRN/intermittently Type of Home: House           Additional Comments: pt not wanting SNF-daughters report they can figure out 24/7 assistance    Prior Function Level of Independence: Needs assistance   Gait / Transfers Assistance Needed: Until recently, pt was ambulating with walker. However with progressive weakness, pt has been requiring +2 assistance at home. Reports L knee is buckling with transfers. Pt has stair lift at home.  ADL's / Homemaking Assistance Needed: Pt's daughters assist with bathing and dressing.        Hand Dominance   Dominant Hand: Right    Extremity/Trunk Assessment   Upper Extremity Assessment: Defer to OT evaluation       LUE Deficits / Details: Daughters report that pt fell and broke L distal clavicle. Pt is healed, however continues to hold LUE in guarded position. Pt reports pain with shoulder flexion around 100-110degrees. LUE also weak (2+/5).   Lower Extremity Assessment: LLE deficits/detail   LLE Deficits / Details: Lt quad 3/5; hip 3-/5   Cervical / Trunk Assessment: Kyphotic  Communication   Communication: HOH  Cognition Arousal/Alertness: Awake/alert Behavior During Therapy: Flat affect Overall Cognitive Status: History of cognitive impairments - at baseline                      General Comments      Exercises        Assessment/Plan    PT Assessment Patient needs continued PT services  PT Diagnosis Difficulty walking;Generalized weakness   PT Problem List Decreased strength;Decreased activity tolerance;Decreased balance;Decreased mobility;Decreased cognition;Decreased safety awareness;Decreased knowledge of use of DME  PT Treatment Interventions DME instruction;Gait training;Functional mobility training;Therapeutic activities;Therapeutic exercise;Balance training;Neuromuscular  re-education;Patient/family education   PT Goals (Current goals can be found in the Care Plan section) Acute Rehab PT Goals Patient Stated Goal: to get better PT Goal Formulation: With patient Potential to Achieve Goals: Fair    Frequency Min 3X/week   Barriers to discharge        Co-evaluation               End of Session Equipment Utilized During Treatment: Gait belt Activity Tolerance: Patient limited by fatigue Patient left: in bed;with call bell/phone within reach;with bed alarm set;with family/visitor present Nurse Communication: Mobility status;Precautions         Time: 9528-4132 PT Time Calculation (min): 18 min   Charges:   PT Evaluation $Initial PT Evaluation Tier I: 1 Procedure PT Treatments $Therapeutic Activity: 8-22 mins   PT G CodesKennis Carina [place], [place]  [phone] 08/29/2013, 5:03 PM

## 2013-08-29 NOTE — Clinical Social Work Psychosocial (Signed)
Clinical Social Work Department BRIEF PSYCHOSOCIAL ASSESSMENT 08/29/2013  Patient:  [name], [name]     Account Number:  [id number]     Admit date:  08/27/2013  Clinical Social Worker:  Delrae Sawyers  Date/Time:  08/29/2013 03:40 PM  Referred by:  Physician  Date Referred:  08/29/2013 Referred for  SNF Placement   Other Referral:   none.   Interview type:  Family Other interview type:   CSW spoke with pt's daughter, [name] [[phone]]    PSYCHOSOCIAL DATA Living Status:  FAMILY Admitted from facility:   Level of care:   Primary support name:  [name] Primary support relationship to patient:  CHILD, ADULT Degree of support available:   Strong support system. Per pt's daughter, [name]], pt lives with her three daughters who all actively care for her.    CURRENT CONCERNS Current Concerns  Post-Acute Placement   Other Concerns:   none.    SOCIAL WORK ASSESSMENT / PLAN CSW received consult for possible SNF placement at time of discharge. CSW spoke with pt's daughter, [name], regarding possible discharge disposition. Per pt's daughter, pt has previously been to [facility] but has not been placed in SNF. Pt's daughter stated pt would "benefit more from SNF because CIR is too much for her."    CSW provided pt's daughter with information regarding SNF search process. Pt's daughter expressed understanding and stated she would prefer [facility] on [address] due to the proximity of their home.    CSW to continue to follow and assist with discharge planning needs.   Assessment/plan status:  Psychosocial Support/Ongoing Assessment of Needs Other assessment/ plan:   none.   Information/referral to community resources:   Disposition deferred until PT evaluation complete. Possible [facility] SNF bed offers.    PATIENT'S/FAMILY'S RESPONSE TO PLAN OF CARE: Pt's daughter understanding and agreeable to possible CSW plan of care.  Pt's daughter had no further questions at this time.       [name], MSW, [facility] Licensed Clinical Social Worker [phone] and [phone]

## 2013-08-29 NOTE — Progress Notes (Signed)
Patient Demographics  [name], is a 80 y.o. female, DOB - [birth year], KTG:[id number]  Admit date - 08/27/2013   Admitting Physician Kelvin Cellar, MD  Outpatient Primary MD for the patient is [name], NP  LOS - 2   Chief Complaint  Patient presents with  . Weakness           Subjective:   [name] today has, No headache, No chest pain, No abdominal pain - No Nausea, No new weakness tingling or numbness, No Cough - SOB. Improving left-sided weakness.    Assessment & Plan    1. Acute on chronic left-sided weakness due to progression of a recent stroke which happened few weeks prior to admission. Seen by neurology no further stroke workup, she will be switched from [place] to Eliquis discussed with her insurance company she has been approved this was recommended by neurology, will be seen by PT OT and speech, question if UTI is contradicting to her weakness. Continue to treat UTI. May require placement discussed with daughter his bedside. A1c is 5.8 and LDL is under 100.  Lab Results  Component Value Date   HGBA1C 5.8* 08/28/2013    Lab Results  Component Value Date   CHOL 166 08/28/2013   HDL 68 08/28/2013   LDLCALC 84 08/28/2013   TRIG 68 08/28/2013   CHOLHDL 2.4 08/28/2013     2. UTI. Specimen poor quality complete 3 days of Rocephin.    3. Atrial fibrillation. Switch from xaralto to Eliquis as recommended by neurology, is not on any rate controlling agents at home or here.    4. Hypertension. Blood pressure stable on no antihypertensive medications at this time.      Code Status: Full  Family Communication: Daughter's bedside  Disposition Plan: To be decided   Procedures MRI   Consults  Neuro   Medications  Scheduled Meds: . apixaban  2.5 mg  Oral BID  . cefTRIAXone (ROCEPHIN)  IV  1 g Intravenous QHS  . divalproex  375 mg Oral BID   Continuous Infusions:  PRN Meds:.acetaminophen, senna-docusate  DVT Prophylaxis  Eliquis  Lab Results  Component Value Date   PLT 163 08/29/2013    Antibiotics   Anti-infectives   Start     Dose/Rate Route Frequency Ordered Stop   08/28/13 0200  cefTRIAXone (ROCEPHIN) 1 g in dextrose 5 % 50 mL IVPB     1 g 100 mL/hr over 30 Minutes Intravenous Daily at bedtime 08/28/13 0129 08/31/13 2159          Objective:   Filed Vitals:   08/29/13 0116 08/29/13 0510 08/29/13 0900 08/29/13 0922  BP: 127/68 107/60 92/56 103/61  Pulse: 63 58 67 69  Temp: 98.5 F (36.9 C) 97.5 F (36.4 C) 97.8 F (36.6 C) 97.8 F (36.6 C)  TempSrc: Oral Oral Oral Oral  Resp: 18 18 18 18   Height:      Weight:      SpO2: 97% 98% 93% 92%    Wt Readings from Last 3 Encounters:  08/27/13 53.887 kg (118 lb 12.8 oz)  08/07/13 53.524 kg (118 lb)  07/31/13 52.617 kg (116 lb)     Intake/Output Summary (Last 24 hours) at 08/29/13 1111 Last data filed  at 08/29/13 0924  Gross per 24 hour  Intake    240 ml  Output      0 ml  Net    240 ml     Physical Exam  Awake Alert, Oriented X 3, No new F.N deficits, improving acute on chronic left-sided weakness, Normal affect [place].AT,PERRAL Supple Neck,No JVD, No cervical lymphadenopathy appriciated.  Symmetrical Chest wall movement, Good air movement bilaterally, CTAB RRR,No Gallops,Rubs or new Murmurs, No Parasternal Heave +ve B.Sounds, Abd Soft, No tenderness, No organomegaly appriciated, No rebound - guarding or rigidity. No Cyanosis, Clubbing or edema, No new Rash or bruise      Data Review   Micro Results Recent Results (from the past 240 hour(s))  URINE CULTURE     Status: None   Collection Time    08/24/13 10:23 PM      Result Value Ref Range Status   Specimen Description URINE, CLEAN CATCH   Final   Special Requests NONE   Final   Culture  Setup  Time     Final   Value: 08/25/2013 03:59     Performed at [facility]     Final   Value: >=100,000 COLONIES/ML     Performed at [facility]   Culture     Final   Value: Multiple bacterial morphotypes present, none predominant. Suggest appropriate recollection if clinically indicated.     Performed at [facility]   Report Status 08/26/2013 FINAL   Final  URINE CULTURE     Status: None   Collection Time    08/27/13  8:12 PM      Result Value Ref Range Status   Specimen Description URINE, CLEAN CATCH   Final   Special Requests NONE   Final   Culture  Setup Time     Final   Value: 08/28/2013 00:37     Performed at [facility]     Final   Value: 30,000 COLONIES/ML     Performed at [facility]   Culture     Final   Value: Multiple bacterial morphotypes present, none predominant. Suggest appropriate recollection if clinically indicated.     Performed at [facility]   Report Status 08/28/2013 FINAL   Final    Radiology Reports Dg Chest 2 View  08/29/2013   CLINICAL DATA:  Weakness.  Stroke.  EXAM: CHEST  2 VIEW  COMPARISON:  08/27/2013  FINDINGS: Cardiac silhouette is normal in size. The aorta is mildly uncoiled. No mediastinal or hilar masses. Clear lungs. No pleural effusion. No pneumothorax. The bony thorax is intact.  IMPRESSION: No active cardiopulmonary disease.   Electronically Signed   By: Lajean Manes M.D.   On: 08/29/2013 01:23   Dg Chest 2 View  07/31/2013   CLINICAL DATA:  Weakness  EXAM: CHEST  2 VIEW  COMPARISON:  03/09/2013  FINDINGS: The heart size and mediastinal contours are within normal limits. Both lungs are clear. The visualized skeletal structures are unremarkable.  IMPRESSION: No active cardiopulmonary disease.   Electronically Signed   By: Franchot Gallo M.D.   On: 07/31/2013 12:36   Ct Head Wo Contrast  08/24/2013   CLINICAL DATA:  Confusion.  Altered mental status.  EXAM: CT HEAD  WITHOUT CONTRAST  TECHNIQUE: Contiguous axial images were obtained from the base of the skull through the vertex without contrast.  COMPARISON:  Head CT 07/31/2013 and MRI 08/11/2013  FINDINGS: Again noted is diffuse cerebral atrophy. There is stable low-density in the periventricular and subcortical white matter. Old infarct involving the right white matter tract. Subtle low-density along the right corpus callosum is probably related to the recently diagnosed infarct. No evidence for acute hemorrhage, mass lesion, midline shift or hydrocephalus. No acute bone abnormality. Again noted is frothy material in the right sphenoid sinus.  IMPRESSION: No acute intracranial abnormality.  Atrophy and evidence for old ischemic changes. Subtle low-density in the region of the right corpus callosum probably related to the recent infarct.   Electronically Signed   By: Markus Daft M.D.   On: 08/24/2013 16:58   Ct Head Wo Contrast  07/31/2013   CLINICAL DATA:  Left-sided weakness  EXAM: CT HEAD WITHOUT CONTRAST  TECHNIQUE: Contiguous axial images were obtained from the base of the skull through the vertex without intravenous contrast.  COMPARISON:  MRI 03/10/2013  FINDINGS: Moderate atrophy. Moderate chronic microvascular ischemic changes in the white matter. Chronic lacunar infarction in the deep white matter on the right extending into the putamen. Small chronic infarct left putamen and right cerebellum.  Negative for acute infarct. MRI may be helpful in this setting. Negative for hemorrhage or mass.  Mild mucosal thickening in the sphenoid sinus.  IMPRESSION: Atrophy and chronic microvascular ischemia.  No acute abnormality.   Electronically Signed   By: Franchot Gallo M.D.   On: 07/31/2013 12:35   Mr Brain Wo Contrast  08/24/2013   CLINICAL DATA:  Altered mental status. Extremity weakness. Known infarct 2 weeks ago. Hypertension.  EXAM: MRI HEAD WITHOUT CONTRAST  TECHNIQUE: Multiplanar, multiecho pulse sequences of the  brain and surrounding structures were obtained without intravenous contrast.  COMPARISON:  08/24/2013 CT.  08/11/2013 MR.  FINDINGS: Slight increase in size of right anterior cerebral artery distribution acute infarct (medial aspect the right frontal lobe) when compared to the 08/11/2013 exam.  No intracranial hemorrhage.  Remote infarct posterior right external capsule extending into posterior right coronal radiata.  Remote small right cerebellar infarcts.  Remote left periatrial infarct.  Prominent small vessel disease type changes.  Global atrophy without hydrocephalus.  No intracranial mass lesion noted on this unenhanced exam.  Major intracranial vascular structures are patent.  IMPRESSION: Slight increase in size of right anterior cerebral artery distribution acute infarct (medial aspect the right frontal lobe) when compared to the 08/11/2013 exam.  Remote infarcts and small vessel disease type changes as detailed above.   Electronically Signed   By: Chauncey Cruel M.D.   On: 08/24/2013 21:04   Mr Brain Wo Contrast  08/11/2013   CLINICAL DATA:  Left leg weakness.  Confusion.  EXAM: MRI HEAD WITHOUT CONTRAST  TECHNIQUE: Multiplanar, multiecho pulse sequences of the brain and surrounding structures were obtained without intravenous contrast.  COMPARISON:  CT 07/31/2013.  MRI 03/10/2013  FINDINGS: Acute infarct involving the right corpus callosum throughout the body and anterior genu. Mild amount of acute infarct in the right frontal white matter above the corpus callosum.  Generalized atrophy. Chronic ischemia throughout the white matter. Chronic infarct in the centrum semiovale on the right. Mild chronic ischemia in the pons and right cerebellum.  Negative for intracranial hemorrhage. Negative for mass or edema. No shift of the midline structures.  Paranasal sinuses are clear.  IMPRESSION: Acute infarct involving the right corpus callosum and right frontal white matter.  Atrophy and extensive chronic ischemia.    Electronically Signed   By: Franchot Gallo M.D.   On:  08/11/2013 14:27   Mr Lumbar Spine Wo Contrast  08/24/2013   CLINICAL DATA:  Lower extremity weakness.  EXAM: MRI LUMBAR SPINE WITHOUT CONTRAST  TECHNIQUE: Multiplanar, multisequence MR imaging of the lumbar spine was performed. No intravenous contrast was administered.  COMPARISON:  02/13/2013 lumbar spine MR.  FINDINGS: Last fully open disk space is labeled L5-S1. Present examination incorporates from T11-12 disc space through the lower sacrum.  Conus L1-2 level.  Mild atherosclerotic type changes of the aorta are. Visualized paravertebral structures otherwise unremarkable.  T11-12: Minimal bulge.  T12-L1: Tiny central protrusion with slight caudal extension. No associated mass effect.  L1-2: Small Schmorl's node deformity. Minimal bulge. Mild facet joint degenerative changes.  L2-3: Mild to moderate facet joint degenerative changes. Ligamentum flavum hypertrophy greater on left. Bulge. Mild to moderate spinal stenosis.  L3-4: Prominent facet joint degenerative changes. Ligamentum flavum hypertrophy. Tiny synovial cyst. Mild retrolisthesis L3. Bulge. Multifactorial moderate to marked spinal stenosis and lateral recess stenosis. Mild bilateral foraminal narrowing.  L4-5: Right greater left facet joint degenerative changes and ligamentum flavum hypertrophy. Minimal retrolisthesis L4. Bulge. Mild spinal stenosis. Mild right lateral recess stenosis. Mild foraminal narrowing bilaterally.  L5-S1: Central spur with indentation central aspect of the thecal sac without nerve root compression. Mild facet joint degenerative changes.  IMPRESSION: No significant change from the prior examination with most notable findings at the L3-4 level where there is multifactorial moderate to marked spinal stenosis and lateral recess stenosis.  Please see above for further detail.   Electronically Signed   By: Chauncey Cruel M.D.   On: 08/24/2013 21:28   Dg Chest Port 1  View  08/27/2013   CLINICAL DATA:  Left-sided weakness.  EXAM: PORTABLE CHEST - 1 VIEW  COMPARISON:  Chest x-ray 07/31/2013.  FINDINGS: Lung volumes are normal. No consolidative airspace disease. No pleural effusions. No pneumothorax. No pulmonary nodule or mass noted. Pulmonary vasculature and the cardiomediastinal silhouette are within normal limits. Atherosclerosis in the thoracic aorta.  IMPRESSION: 1. No radiographic evidence of acute cardiopulmonary disease. 2. Atherosclerosis.   Electronically Signed   By: Vinnie Langton M.D.   On: 08/27/2013 20:33    CBC  Recent Labs Lab 08/24/13 1542 08/27/13 1944 08/29/13 0500  WBC 3.9* 4.8 4.3  HGB 12.7 12.3 11.7*  HCT 38.8 37.8 34.8*  PLT 169 144* 163  MCV 95.1 96.9 93.5  MCH 31.1 31.5 31.5  MCHC 32.7 32.5 33.6  RDW 15.2 15.0 15.0  LYMPHSABS  --  1.6  --   MONOABS  --  1.0  --   EOSABS  --  0.1  --   BASOSABS  --  0.0  --     Chemistries   Recent Labs Lab 08/24/13 1542 08/27/13 1944 08/29/13 0500  NA 145 146 145  K 4.5 5.3 4.3  CL 106 105 107  CO2 31 31 29   GLUCOSE 95 106* 81  BUN 18 22 20   CREATININE 0.79 1.10 0.95  CALCIUM 9.1 9.6 8.5  AST 30 22  --   ALT 16 12  --   ALKPHOS 68 70  --   BILITOT 0.2* 0.2*  --    ------------------------------------------------------------------------------------------------------------------ estimated creatinine clearance is 36.2 ml/min (by C-G formula based on Cr of 0.95). ------------------------------------------------------------------------------------------------------------------  Recent Labs  08/28/13 0716  HGBA1C 5.8*   ------------------------------------------------------------------------------------------------------------------  Recent Labs  08/28/13 0716  CHOL 166  HDL 68  LDLCALC 84  TRIG 68  CHOLHDL 2.4   ------------------------------------------------------------------------------------------------------------------ No results found for this  basename:  TSH, T4TOTAL, FREET3, T3FREE, THYROIDAB,  in the last 72 hours ------------------------------------------------------------------------------------------------------------------ No results found for this basename: VITAMINB12, FOLATE, FERRITIN, TIBC, IRON, RETICCTPCT,  in the last 72 hours  Coagulation profile  Recent Labs Lab 08/24/13 1929 08/27/13 1944  INR 1.08 1.72*    No results found for this basename: DDIMER,  in the last 72 hours  Cardiac Enzymes  Recent Labs Lab 08/24/13 1929  TROPONINI <0.30   ------------------------------------------------------------------------------------------------------------------ No components found with this basename: POCBNP,      Time Spent in minutes   35   Thurnell Lose M.D on 08/29/2013 at 11:11 AM  Between 7am to 7pm - Pager - [phone]  After 7pm go to [email or web address] - password [email or web address]  And look for the night coverage person covering for me after hours  [facility] Office  [phone]   **Disclaimer: This note may have been dictated with voice recognition software. Similar sounding words can inadvertently be transcribed and this note may contain transcription errors which may not have been corrected upon publication of note.**

## 2013-08-29 NOTE — Progress Notes (Signed)
At discharge Attending MD must call the insurance company for authorization so that the co pay for Eliquis will be $35 at discharge   auth required [id number]/ tier 3/ co-pay $35.00 retail/ mail order 90 day $100

## 2013-08-29 NOTE — Evaluation (Signed)
Speech Language Pathology Evaluation Patient Details Name: [name] MRN: [id number] DOB: 05/08/1924 Today's Date: 08/29/2013 Time: 3536-1443 SLP Time Calculation (min): 17 min  Problem List:  Patient Active Problem List   Diagnosis Date Noted  . UTI (urinary tract infection) 08/07/2013  . History of CVA (cerebrovascular accident) 06/02/2013  . Unspecified constipation 03/16/2013  . Left-sided weakness 03/09/2013  . Possible Seizures 02/14/2013  . TIA (transient ischemic attack) 02/12/2013  . Hyperglycemia 02/12/2013  . High cholesterol   . Atrial fibrillation 02/05/2013  . Chronic anticoagulation 02/05/2013  . HTN (hypertension) 11/14/2012  . Right ureteral stone 09/15/2010  . Atrial flutter 07/17/2010  . OSTEOPENIA 06/03/2009  . MIGRAINE HEADACHE 02/05/2008  . CATARACTS, BILATERAL 02/05/2008  . DECREASED HEARING 02/05/2008  . GEN OSTEOARTHROSIS INVOLVING MULTIPLE SITES 02/05/2008  . COLONIC POLYPS, HX OF 02/05/2008   Past Medical History:  Past Medical History  Diagnosis Date  . Arthritis   . Hypertension   . Migraine   . Colon polyp   . Hearing difficulty   . Torus palatinus   . Atrial flutter January, 2012  . Stroke 08/02/12     right lenticular nucleus infarct  . Left leg weakness 02/05/2013  . Chronic anticoagulation   . High cholesterol   . Atrial fibrillation    Past Surgical History:  Past Surgical History  Procedure Laterality Date  . Cataract extraction w/ intraocular lens  implant, bilateral  2006-2008  . Ganglion cyst excision Bilateral 1938,1954,2003,2005    "wrists/hand" (08/01/2012)  . Cardioversion  05/19/2010    Dr. Einar Gip  . Tonsillectomy  ~ 1935  . Appendectomy  02/19/53    `  . Mouth surgery      [name]   HPI:  80 y.o. female with a past medical history of atrial fibrillation, CVA in May of 2014 (right), migraine, HOH. Patient seen by  primary care provider 3 weeks ago for increasing extremity weakness. Outpatient MRI revealed 08/11/2013  which showed an acute infarct involving the right corpus callosum and right frontal white matter (not hospitalized). Repeat MRI 08/24/2013 showed slight increase in size of right anterior cerebral artery distribution infarct when compared to prior exam.   Assessment / Plan / Recommendation Clinical Impression  Pt. presents with mild-mod cognitive deficits that have slowly progressed since Nov per daughters.  Daughters are responsible for her fincances, medications and food preparation.  Pt. is oriented, conversive with hesitations during conversation, intelligible speech although monotone and decreased intensity.  Pt. would not need home health ST but edcuated daughters on strategies to stimulate cognitive abilites (using calendars, word finding strategies, pt. liked crossword puzzles and suggested simple/large print puzzles, continue "family trivia games").  No further ST needed at this time.    SLP Assessment  Patient does not need any further Speech Lanaguage Pathology Services    Follow Up Recommendations  None    Frequency and Duration        Pertinent Vitals/Pain WDL       SLP Evaluation Prior Functioning  Cognitive/Linguistic Baseline: Baseline deficits Baseline deficit details:  (working memory) Type of Home: House  Lives With: Family Available Help at Discharge: Family;Available 24 hours/day;Available PRN/intermittently Vocation: Retired   Associate Professor  Overall Cognitive Status: History of cognitive impairments - at baseline Arousal/Alertness: Awake/alert Orientation Level: Oriented to person;Oriented to place;Oriented to time;Disoriented to situation Attention: Sustained Sustained Attention: Appears intact Memory: Impaired Memory Impairment: Decreased recall of new information;Prospective memory Awareness: Impaired Awareness Impairment: Anticipatory impairment Problem Solving: Impaired Problem Solving  Impairment: Functional basic;Functional complex Safety/Judgment:  Impaired    Comprehension  Auditory Comprehension Overall Auditory Comprehension: Appears within functional limits for tasks assessed Yes/No Questions: Within Functional Limits Commands: Within Functional Limits Conversation: Simple Visual Recognition/Discrimination Discrimination: Not tested Reading Comprehension Reading Status: Impaired Word level: Within functional limits Functional Environmental (signs, name badge): Impaired    Expression Expression Primary Mode of Expression: Verbal Verbal Expression Overall Verbal Expression: Impaired Initiation: No impairment Level of Generative/Spontaneous Verbalization: Conversation Repetition:  (NT) Naming: Not tested (some pauses, dysnomia) Pragmatics: No impairment Written Expression Dominant Hand: Right Written Expression: Not tested   Oral / Motor Oral Motor/Sensory Function Overall Oral Motor/Sensory Function: Impaired at baseline Labial ROM: Reduced left Labial Symmetry: Abnormal symmetry left Labial Strength: Reduced Lingual ROM: Within Functional Limits Lingual Symmetry: Within Functional Limits Lingual Strength: Within Functional Limits Facial ROM: Reduced left Motor Speech Overall Motor Speech: Impaired Respiration: Within functional limits Phonation: Low vocal intensity Resonance: Within functional limits Articulation: Within functional limitis Intelligibility: Intelligible Motor Planning: Witnin functional limits   GO     Orbie Pyo [employer] [phone]  08/29/2013

## 2013-08-29 NOTE — Progress Notes (Signed)
ANTICOAGULATION CONSULT NOTE - Initial Consult  Pharmacy Consult for eliquis Indication: atrial fibrillation  Allergies  Allergen Reactions  . Tramadol Hcl Other (See Comments)     lethargy, nausea  . Alendronate Sodium Rash and Other (See Comments)    puffy eyes    Patient Measurements: Height: 5\' 7"  (170.2 cm) Weight: 118 lb 12.8 oz (53.887 kg) IBW/kg (Calculated) : 61.6   Vital Signs: Temp: 97.8 F (36.6 C) (06/03 0922) Temp src: Oral (06/03 0922) BP: 103/61 mmHg (06/03 0922) Pulse Rate: 69 (06/03 0922)  Labs:  Recent Labs  08/27/13 1944 08/29/13 0500  HGB 12.3 11.7*  HCT 37.8 34.8*  PLT 144* 163  LABPROT 19.7*  --   INR 1.72*  --   CREATININE 1.10 0.95    Estimated Creatinine Clearance: 36.2 ml/min (by C-G formula based on Cr of 0.95).   Medical History: Past Medical History  Diagnosis Date  . Arthritis   . Hypertension   . Migraine   . Colon polyp   . Hearing difficulty   . Torus palatinus   . Atrial flutter January, 2012  . Stroke 08/02/12     right lenticular nucleus infarct  . Left leg weakness 02/05/2013  . Chronic anticoagulation   . High cholesterol   . Atrial fibrillation     Medications:  Prescriptions prior to admission  Medication Sig Dispense Refill  . acetaminophen (TYLENOL) 500 MG tablet Take 1,000 mg by mouth daily as needed for mild pain or headache.      Marland Kitchen aspirin-acetaminophen-caffeine (EXCEDRIN MIGRAINE) 250-250-65 MG per tablet Take 2 tablets by mouth daily as needed for pain (for migraines).      Marland Kitchen b complex vitamins tablet Take 1 tablet by mouth 2 (two) times daily.       . Calcium Carb-Cholecalciferol (CALCIUM 600 + D) 600-200 MG-UNIT TABS Take 1 tablet by mouth 2 (two) times daily.      . Cholecalciferol (VITAMIN D) 2000 UNITS tablet Take 2,000 Units by mouth daily.      . divalproex (DEPAKOTE) 125 MG DR tablet Take 375 mg by mouth 2 (two) times daily.      . Multiple Vitamin (MULTIVITAMIN WITH MINERALS) TABS Take 1  tablet by mouth daily.      . Rivaroxaban (XARELTO) 15 MG TABS tablet Take 15 mg by mouth daily with supper.        Assessment: 80 yo lady who is currently on xarelto for secondary stroke prevention, afib to switch to eliquis.  Last dose xarelto last pm at 17:23.  She is >81 years old and weight <60 kg so she will get adjusted dose of eliquis. Goal of Therapy:  Monitor platelets by anticoagulation protocol: Yes   Plan:  Eliquis 2.5 mg po bid starting this evening. Monitor for bleeding   Ikhlas Albo Poteet Alese Furniss 08/29/2013,10:46 AM

## 2013-08-29 NOTE — Progress Notes (Signed)
Occupational Therapy Evaluation Patient Details Name: [name] MRN: [id number] DOB: [birth year] Today's Date: 08/29/2013    History of Present Illness Pt is an 80 y.o. Female with PMH of CVA (March 2014) with residual Left sided weakness who was admitted 08/27/13 for worsening Left sided weakness.  MRI of the brain performed on 08/01/2012 showed acute nonhemorrhagic infarct extending from the lateral aspect of the right lenticular nucleus superiorly into the mid aspect of the right corona radiata, presented as a transfer from [place]. Patient was seen by her primary care provider about 3 weeks ago for increasing extremity weakness. Outpatient MRI was performed on 08/11/2013 which showed an acute infarct involving the right corpus callosum and right frontal white matter. Pt was not hospitalized at this time, however continued to have left-sided weakness, with a significant clinical decline over the last several days, now requiring 2 person assist to get out of bed. MRI of brain was performed on 08/24/2013 which showed slight increase in size of right anterior cerebral artery distribution infarct when compared to prior exam.   Clinical Impression   PTA pt lived at home with her daughters and required assistance for ADLs. Pt was ambulating with walker until recently with progressive weakness in LLE. Pt/daughters report that LLE buckles when pt stands. Pt requires +2 assist for stand-pivot transfer for safety. Recommend that pt d/c to Rehab at SNF to increase strength and activity tolerance for independence with ADLs prior to d/c home.     Follow Up Recommendations  SNF;Supervision/Assistance - 24 hour    Equipment Recommendations  Other (comment) (Defer to SNF)       Precautions / Restrictions Precautions Precautions: Fall Restrictions Weight Bearing Restrictions: No      Mobility Bed Mobility Overal bed mobility: Needs Assistance Bed Mobility: Supine to Sit;Sit to Supine      Supine to sit: Mod assist;HOB elevated Sit to supine: Mod assist;HOB elevated   General bed mobility comments: Pt required assist to sit EOB for LLE management and assist for trunk off bed. Pt was afraid of falling off bed despite OT support.   Transfers Overall transfer level: Needs assistance Equipment used:  (therapist lift using gait belt and blocking knees) Transfers: Stand Pivot Transfers   Stand pivot transfers: Mod assist;+2 safety/equipment       General transfer comment: Pt had difficulty moving LLE during pivot         ADL Overall ADL's : Needs assistance/impaired Eating/Feeding: Set up;Sitting   Grooming: Set up;Sitting   Upper Body Bathing: Sitting;Minimal assitance   Lower Body Bathing: Sit to/from stand;Maximal assistance;+2 for safety/equipment   Upper Body Dressing : Sitting;Moderate assistance   Lower Body Dressing: Total assistance;+2 for safety/equipment;Sit to/from stand   Toilet Transfer: Moderate assistance;+2 for safety/equipment;Stand-pivot (pt had difficulty bringing LLE to pivot during transfer)   Toileting- Clothing Manipulation and Hygiene: Total assistance;Sit to/from stand;+2 for physical assistance   Tub/ Shower Transfer: Total assistance     General ADL Comments: Pt reports that her L knee is buckling during sit<>stand. Pt had difficulty moving LLE during stand-pivot. Pt has severe fear of falls with some extension present during stand>sit. Tactile assist and verbal cueing for pt to sit up straight, tends to lean to L side when she WB on RUE for support in EOB sitting. Pt follows simple commands, however daughters report that she was tired and "not with it" as much today. Daughters feel this is due to present UTI.  Vision  Per pt report, no change from baseline.                   Perception Perception Perception Tested?: No   Praxis Praxis Praxis tested?: Within functional limits    Pertinent Vitals/Pain No c/o pain.      Hand Dominance Right   Extremity/Trunk Assessment Upper Extremity Assessment Upper Extremity Assessment: LUE deficits/detail LUE Deficits / Details: Daughters report that pt fell and broke L distal clavicle. Pt is healed, however continues to hold LUE in guarded position. Pt reports pain with shoulder flexion around 100-110degrees. LUE also weak (2+/5).   Lower Extremity Assessment Lower Extremity Assessment: Defer to PT evaluation   Cervical / Trunk Assessment Cervical / Trunk Assessment: Kyphotic   Communication Communication Communication: HOH   Cognition Arousal/Alertness: Awake/alert Behavior During Therapy: WFL for tasks assessed/performed;Anxious Overall Cognitive Status: History of cognitive impairments - at baseline                                Home Living Family/patient expects to be discharged to:: Skilled nursing facility Living Arrangements: Children Available Help at Discharge: Family;Available 24 hours/day;Available PRN/intermittently Type of Home: House                                  Prior Functioning/Environment Level of Independence: Needs assistance  Gait / Transfers Assistance Needed: Until recently, pt was ambulating with walker. However with progressive weakness, pt has been requiring +2 assistance at home. Reports L knee is buckling with transfers. Pt has stair lift at home. ADL's / Homemaking Assistance Needed: Pt's daughters assist with bathing and dressing.        OT Diagnosis: Generalized weakness;Paresis   OT Problem List: Decreased strength;Decreased range of motion;Decreased activity tolerance;Impaired balance (sitting and/or standing);Decreased cognition;Decreased safety awareness;Decreased knowledge of use of DME or AE   OT Treatment/Interventions: Self-care/ADL training;Therapeutic exercise;Energy conservation;DME and/or AE instruction;Therapeutic activities;Patient/family education;Balance training    OT  Goals(Current goals can be found in the care plan section) Acute Rehab OT Goals Patient Stated Goal: to get better OT Goal Formulation: With patient/family Time For Goal Achievement: 09/05/13 Potential to Achieve Goals: Good ADL Goals Pt Will Perform Grooming: with min assist;standing Pt Will Transfer to Toilet: with min assist;stand pivot transfer;bedside commode Additional ADL Goal #1: Pt will perform bed mobility with min assist to prepare for ADLs.   OT Frequency: Min 2X/week   Barriers to D/C: Other (comment)  Pt's daughters willing to assist, however pt's level of assist required is too great for daughters.           End of Session Equipment Utilized During Treatment: Gait belt  Activity Tolerance: Patient tolerated treatment well Patient left: in bed;with call bell/phone within reach;Other (comment);with family/visitor present (with transporter arriving to take pt to MRI)   Time: 1020-1109 OT Time Calculation (min): 49 min Charges:  OT General Charges $OT Visit: 1 Procedure OT Evaluation $Initial OT Evaluation Tier I: 1 Procedure OT Treatments $Self Care/Home Management : 23-37 mins $Therapeutic Activity: 8-22 mins  Juluis Rainier [phone] 08/29/2013, 3:01 PM

## 2013-08-30 ENCOUNTER — Telehealth: Payer: Self-pay | Admitting: Family

## 2013-08-30 ENCOUNTER — Ambulatory Visit: Payer: Medicare Other | Admitting: Physical Therapy

## 2013-08-30 ENCOUNTER — Ambulatory Visit: Payer: Medicare Other | Admitting: Occupational Therapy

## 2013-08-30 DIAGNOSIS — F0390 Unspecified dementia without behavioral disturbance: Secondary | ICD-10-CM | POA: Diagnosis not present

## 2013-08-30 DIAGNOSIS — E559 Vitamin D deficiency, unspecified: Secondary | ICD-10-CM | POA: Diagnosis not present

## 2013-08-30 DIAGNOSIS — M6281 Muscle weakness (generalized): Secondary | ICD-10-CM | POA: Diagnosis not present

## 2013-08-30 DIAGNOSIS — G40909 Epilepsy, unspecified, not intractable, without status epilepticus: Secondary | ICD-10-CM | POA: Diagnosis not present

## 2013-08-30 DIAGNOSIS — E785 Hyperlipidemia, unspecified: Secondary | ICD-10-CM | POA: Diagnosis not present

## 2013-08-30 DIAGNOSIS — Z79899 Other long term (current) drug therapy: Secondary | ICD-10-CM | POA: Diagnosis not present

## 2013-08-30 DIAGNOSIS — R269 Unspecified abnormalities of gait and mobility: Secondary | ICD-10-CM | POA: Diagnosis not present

## 2013-08-30 DIAGNOSIS — I4891 Unspecified atrial fibrillation: Secondary | ICD-10-CM | POA: Diagnosis not present

## 2013-08-30 DIAGNOSIS — Z8673 Personal history of transient ischemic attack (TIA), and cerebral infarction without residual deficits: Secondary | ICD-10-CM | POA: Diagnosis not present

## 2013-08-30 DIAGNOSIS — I6789 Other cerebrovascular disease: Secondary | ICD-10-CM | POA: Diagnosis not present

## 2013-08-30 DIAGNOSIS — Z5189 Encounter for other specified aftercare: Secondary | ICD-10-CM | POA: Diagnosis not present

## 2013-08-30 DIAGNOSIS — I4892 Unspecified atrial flutter: Secondary | ICD-10-CM | POA: Diagnosis not present

## 2013-08-30 DIAGNOSIS — R279 Unspecified lack of coordination: Secondary | ICD-10-CM | POA: Diagnosis not present

## 2013-08-30 DIAGNOSIS — I69959 Hemiplegia and hemiparesis following unspecified cerebrovascular disease affecting unspecified side: Secondary | ICD-10-CM | POA: Diagnosis not present

## 2013-08-30 DIAGNOSIS — N390 Urinary tract infection, site not specified: Secondary | ICD-10-CM | POA: Diagnosis not present

## 2013-08-30 DIAGNOSIS — I6350 Cerebral infarction due to unspecified occlusion or stenosis of unspecified cerebral artery: Secondary | ICD-10-CM | POA: Diagnosis not present

## 2013-08-30 DIAGNOSIS — R488 Other symbolic dysfunctions: Secondary | ICD-10-CM | POA: Diagnosis not present

## 2013-08-30 MED ORDER — CEFUROXIME AXETIL 500 MG PO TABS: 500.0000 mg | ORAL_TABLET | Freq: Two times a day (BID) | ORAL | Status: DC

## 2013-08-30 MED ORDER — APIXABAN 2.5 MG PO TABS: 2.5000 mg | ORAL_TABLET | Freq: Two times a day (BID) | ORAL | Status: DC

## 2013-08-30 NOTE — Discharge Instructions (Signed)
Follow with Primary MD [name], NP in 7 days   Get CBC, CMP, checked 7 days by Primary MD.   Activity: As tolerated with Full fall precautions use walker/cane & assistance as needed   Disposition SNF   Diet: Heart Healthy    For Heart failure patients - Check your Weight same time everyday, if you gain over 2 pounds, or you develop in leg swelling, experience more shortness of breath or chest pain, call your Primary MD immediately. Follow Cardiac Low Salt Diet and 1.8 lit/day fluid restriction.   On your next visit with her primary care physician please Get Medicines reviewed and adjusted.  Please request your Prim.MD to go over all Hospital Tests and Procedure/Radiological results at the follow up, please get all Hospital records sent to your Prim MD by signing hospital release before you go home.   If you experience worsening of your admission symptoms, develop shortness of breath, life threatening emergency, suicidal or homicidal thoughts you must seek medical attention immediately by calling 911 or calling your MD immediately  if symptoms less severe.  You Must read complete instructions/literature along with all the possible adverse reactions/side effects for all the Medicines you take and that have been prescribed to you. Take any new Medicines after you have completely understood and accpet all the possible adverse reactions/side effects.   Do not drive and provide baby sitting services if your were admitted for syncope or siezures until you have seen by Primary MD or a Neurologist and advised to do so again.  Do not drive when taking Pain medications.    Do not take more than prescribed Pain, Sleep and Anxiety Medications  Special Instructions: If you have smoked or chewed Tobacco  in the last 2 yrs please stop smoking, stop any regular Alcohol  and or any Recreational drug use.  Wear Seat belts while driving.   Please note  You were cared for by a  hospitalist during your hospital stay. If you have any questions about your discharge medications or the care you received while you were in the hospital after you are discharged, you can call the unit and asked to speak with the hospitalist on call if the hospitalist that took care of you is not available. Once you are discharged, your primary care physician will handle any further medical issues. Please note that NO REFILLS for any discharge medications will be authorized once you are discharged, as it is imperative that you return to your primary care physician (or establish a relationship with a primary care physician if you do not have one) for your aftercare needs so that they can reassess your need for medications and monitor your lab values.

## 2013-08-30 NOTE — Progress Notes (Signed)
Physical Therapy Treatment Patient Details Name: [name] MRN: [id number] DOB: Oct 29, 1924 Today's Date: 08/30/2013    History of Present Illness Pt is an 80 y.o. Female with PMH of CVA (March 2014) with residual Left sided weakness who was admitted 08/27/13 for worsening Left sided weakness.  MRI of the brain performed on 08/01/2012 showed acute nonhemorrhagic infarct extending from the lateral aspect of the right lenticular nucleus superiorly into the mid aspect of the right corona radiata, presented as a transfer from [place]. Patient was seen by her primary care provider about 3 weeks ago for increasing extremity weakness. Outpatient MRI was performed on 08/11/2013 which showed an acute infarct involving the right corpus callosum and right frontal white matter. Pt was not hospitalized at this time, however continued to have left-sided weakness, with a significant clinical decline over the last several days, now requiring 2 person assist to get out of bed. MRI of brain was performed on 08/24/2013 which showed slight increase in size of right anterior cerebral artery distribution infarct when compared to prior exam.    PT Comments    Pt progressing well with mobility today. Continues to have fear of falling and benefits from 2 person HHA for safety and to increase confidence. Pt continues to require SNF for post acute rehab.   Follow Up Recommendations  SNF;Other (comment)     Equipment Recommendations  Hospital bed    Recommendations for Other Services       Precautions / Restrictions Precautions Precautions: Fall Precaution Comments: Lt LE weakness Restrictions Weight Bearing Restrictions: No    Mobility  Bed Mobility Overal bed mobility: Needs Assistance Bed Mobility: Supine to Sit     Supine to sit: Mod assist;HOB elevated     General bed mobility comments: (A) to advance Lt LE and bring trunk to sitting position; pt with increased abdominal strength today to  (A) with elevation of trunk; cues for hand placement   Transfers Overall transfer level: Needs assistance Equipment used: 2 person hand held assist Transfers: Sit to/from Stand Sit to Stand: +2 safety/equipment;Mod assist         General transfer comment: pt performed sit to stand with increased ease today due to having 2nd person for comfort; pt very fearful of falling; pt able to increase WB on Lt LE today; cues for upright posture   Ambulation/Gait Ambulation/Gait assistance: +2 safety/equipment;+2 physical assistance;Min assist Ambulation Distance (Feet): 100 Feet Assistive device: 2 person hand held assist Gait Pattern/deviations: Step-through pattern;Decreased stance time - left;Decreased step length - right;Decreased stride length;Narrow base of support;Trunk flexed Gait velocity: decreased Gait velocity interpretation: Below normal speed for age/gender General Gait Details: cues for upright posture and to increase BOS and stride length; pt much more comfortable and confident with 2 person HHA; cues for safety    Stairs            Wheelchair Mobility    Modified Rankin (Stroke Patients Only)       Balance Overall balance assessment: Needs assistance Sitting-balance support: Feet supported;Bilateral upper extremity supported Sitting balance-Leahy Scale: Poor Sitting balance - Comments: posterior lean but correctable with multimodal cues for upright posture Postural control: Posterior lean Standing balance support: During functional activity;Bilateral upper extremity supported Standing balance-Leahy Scale: Poor Standing balance comment: 2 person HHA                     Cognition Arousal/Alertness: Awake/alert Behavior During Therapy: Flat affect Overall Cognitive Status: History  of cognitive impairments - at baseline                      Exercises General Exercises - Lower Extremity Ankle Circles/Pumps: AROM;Strengthening;Both;10 reps     General Comments        Pertinent Vitals/Pain Denies any pain     Home Living                      Prior Function            PT Goals (current goals can now be found in the care plan section) Acute Rehab PT Goals Patient Stated Goal: to get of here  PT Goal Formulation: With patient Potential to Achieve Goals: Fair Progress towards PT goals: Progressing toward goals    Frequency  Min 3X/week    PT Plan Current plan remains appropriate    Co-evaluation             End of Session Equipment Utilized During Treatment: Gait belt Activity Tolerance: Patient tolerated treatment well Patient left: in chair;with call bell/phone within reach;with family/visitor present     Time: 0258-5277 PT Time Calculation (min): 14 min  Charges:  $Gait Training: 8-22 mins                    G CodesKennis Carina [place], [place]  [phone] 08/30/2013, 1:49 PM

## 2013-08-30 NOTE — Clinical Social Work Placement (Signed)
Clinical Social Work Department CLINICAL SOCIAL WORK PLACEMENT NOTE 08/30/2013  Patient:  [name], [name]  Account Number:  [id number] Admit date:  08/27/2013  Clinical Social Worker:  Delrae Sawyers  Date/time:  08/30/2013 12:30 PM  Clinical Social Work is seeking post-discharge placement for this patient at the following level of care:   [place]   (*CSW will update this form in Epic as items are completed)   08/29/2013  Patient/family provided with [place] Department of Clinical Social Work's list of facilities offering this level of care within the geographic area requested by the patient (or if unable, by the patient's family).  08/29/2013  Patient/family informed of their freedom to choose among providers that offer the needed level of care, that participate in Medicare, Medicaid or managed care program needed by the patient, have an available bed and are willing to accept the patient.  08/29/2013  Patient/family informed of [facility]' ownership interest in [facility], as well as of the fact that they are under no obligation to receive care at this facility.  PASARR submitted to EDS on 08/29/2013 PASARR number received from [employer] on 08/29/2013  FL2 transmitted to all facilities in geographic area requested by pt/family on  08/29/2013 FL2 transmitted to all facilities within larger geographic area on   Patient informed that his/her managed care company has contracts with or will negotiate with  certain facilities, including the following:     Patient/family informed of bed offers received:  08/30/2013 Patient chooses bed at [place] recommends and patient chooses bed at  [facility]  Patient to be transferred to [place] on  08/30/2013 Patient to be transferred to facility by [facility]  The following physician request were entered in Epic:   Additional Comments:  [name], MSW, [facility] Licensed Clinical Social Worker [phone]  and [phone]

## 2013-08-30 NOTE — Clinical Social Work Note (Signed)
Discharge summary has been faxed to [place] at [facility]. Discharge packet has been completed and placed on pt's shadow chart. Pt's daughter, Tillman Abide, updated on discharge for 08/30/2013. Pt to be transported via EMS ([facility]). RN updated on discharge and to call report.  [name], MSW, [facility] Licensed Clinical Social Worker [phone] and [phone]

## 2013-08-30 NOTE — Telephone Encounter (Signed)
Please contact pt's daughter and arrange a 1 week hospital follow up.

## 2013-08-30 NOTE — Discharge Summary (Signed)
[name], is a 80 y.o. female  DOB 1925-02-09  MRN [id number].  Admission date:  08/27/2013  Admitting Physician  Toni Cellar, MD  Discharge Date:  08/30/2013   Primary MD  Toni Pear., NP  Recommendations for primary care physician for things to follow:   Monitor secondary factors for CVA   Admission Diagnosis  CVA (cerebral infarction) [434.91]   Discharge Diagnosis  CVA (cerebral infarction) [434.91]  subacute not this admission  Active Problems:   HTN (hypertension)   Atrial fibrillation   Chronic anticoagulation   Left-sided weakness   History of CVA (cerebrovascular accident)      Past Medical History  Diagnosis Date  . Arthritis   . Hypertension   . Migraine   . Colon polyp   . Hearing difficulty   . Torus palatinus   . Atrial flutter January, 2012  . Stroke 08/02/12     right lenticular nucleus infarct  . Left leg weakness 02/05/2013  . Chronic anticoagulation   . High cholesterol   . Atrial fibrillation     Past Surgical History  Procedure Laterality Date  . Cataract extraction w/ intraocular lens  implant, bilateral  2006-2008  . Ganglion cyst excision Bilateral 1938,1954,2003,2005    "wrists/hand" (08/01/2012)  . Cardioversion  05/19/2010    Dr. Einar Gregory  . Tonsillectomy  ~ 1935  . Appendectomy  02/19/53    `  . Mouth surgery      [name]     Discharge Condition: Stable   Follow UP  Follow-up Information   Follow up with Toni Pear., NP. Schedule an appointment as soon as possible for a visit in 1 week.   Specialty:  Internal Medicine   Contact information:   [place] [address] [phone]       Follow up with [name]. Schedule an appointment as soon as possible for a visit in 4 weeks.   Contact information:   [address]     [place] [address] [phone]        Discharge Instructions  and  Discharge Medications      Discharge Instructions   Diet - low sodium heart healthy    Complete by:  As directed      Discharge instructions    Complete by:  As directed   Follow with Primary MD Toni S., NP in 7 days   Get CBC, CMP, checked 7 days by Primary MD.   Activity: As tolerated with Full fall precautions use walker/cane & assistance as needed   Disposition SNF   Diet: Heart Healthy    For Heart failure patients - Check your Weight same time everyday, if you gain over 2 pounds, or you develop in leg swelling, experience more shortness of breath or chest pain, call your Primary MD immediately. Follow Cardiac Low Salt Diet and 1.8 lit/day fluid restriction.   On your next visit with her primary care physician please Get Medicines reviewed and adjusted.  Please request your Prim.MD to go over all Hospital Tests and Procedure/Radiological results at the follow up, please get all Hospital records sent to your Prim MD by signing hospital release before you go home.   If you experience worsening of your admission symptoms, develop shortness of breath, life threatening emergency, suicidal or homicidal thoughts you must seek medical attention immediately by calling 911 or calling your MD immediately  if symptoms less severe.  You Must read complete instructions/literature along with all the possible adverse reactions/side effects for all the Medicines you take and that have been prescribed to you. Take any new Medicines after you have completely understood and accpet all the possible adverse reactions/side effects.   Do not drive and provide baby sitting services if your were admitted for syncope or siezures until you have seen by Primary MD or a Neurologist and advised to do so again.  Do not drive when taking Pain medications.    Do not take more than  prescribed Pain, Sleep and Anxiety Medications  Special Instructions: If you have smoked or chewed Tobacco  in the last 2 yrs please stop smoking, stop any regular Alcohol  and or any Recreational drug use.  Wear Seat belts while driving.   Please note  You were cared for by a hospitalist during your hospital stay. If you have any questions about your discharge medications or the care you received while you were in the hospital after you are discharged, you can call the unit and asked to speak with the hospitalist on call if the hospitalist that took care of you is not available. Once you are discharged, your primary care physician will handle any further medical issues. Please note that NO REFILLS for any discharge medications will be authorized once you are discharged, as it is imperative that you return to your primary care physician (or establish a relationship with a primary care physician if you do not have one) for your aftercare needs so that they can reassess your need for medications and monitor your lab values.  Follow with Primary MD Toni Pear., NP in 7 days   Get CBC, CMP, checked 7 days by Primary MD.   Activity: As tolerated with Full fall precautions use walker/cane & assistance as needed   Disposition SNF   Diet: Heart Healthy    For Heart failure patients - Check your Weight same time everyday, if you gain over 2 pounds, or you develop in leg swelling, experience more shortness of breath or chest pain, call your Primary MD immediately. Follow Cardiac Low Salt Diet and 1.8 lit/day fluid restriction.   On your next visit with her primary care physician please Get Medicines reviewed and adjusted.  Please request your Prim.MD to go over all Hospital Tests and Procedure/Radiological results at the follow up, please get all Hospital records sent to your Prim MD by signing hospital release before you go home.   If you experience worsening of your admission  symptoms, develop shortness of breath, life threatening emergency, suicidal or homicidal thoughts you must seek medical attention immediately by calling 911 or calling your MD immediately  if symptoms less severe.  You Must read complete instructions/literature along with all the possible adverse reactions/side effects for all the Medicines you take and that have been prescribed to you. Take any new Medicines after you have completely understood and accpet all the possible adverse reactions/side effects.   Do not drive and provide baby sitting services if your were admitted for  syncope or siezures until you have seen by Primary MD or a Neurologist and advised to do so again.  Do not drive when taking Pain medications.    Do not take more than prescribed Pain, Sleep and Anxiety Medications  Special Instructions: If you have smoked or chewed Tobacco  in the last 2 yrs please stop smoking, stop any regular Alcohol  and or any Recreational drug use.  Wear Seat belts while driving.   Please note  You were cared for by a hospitalist during your hospital stay. If you have any questions about your discharge medications or the care you received while you were in the hospital after you are discharged, you can call the unit and asked to speak with the hospitalist on call if the hospitalist that took care of you is not available. Once you are discharged, your primary care physician will handle any further medical issues. Please note that NO REFILLS for any discharge medications will be authorized once you are discharged, as it is imperative that you return to your primary care physician (or establish a relationship with a primary care physician if you do not have one) for your aftercare needs so that they can reassess your need for medications and monitor your lab values.     Increase activity slowly    Complete by:  As directed             Medication List    STOP taking these medications        Rivaroxaban 15 MG Tabs tablet  Commonly known as:  XARELTO      TAKE these medications       acetaminophen 500 MG tablet  Commonly known as:  TYLENOL  Take 1,000 mg by mouth daily as needed for mild pain or headache.     apixaban 2.5 MG Tabs tablet  Commonly known as:  ELIQUIS  Take 1 tablet (2.5 mg total) by mouth 2 (two) times daily.     aspirin-acetaminophen-caffeine 751-025-85 MG per tablet  Commonly known as:  EXCEDRIN MIGRAINE  Take 2 tablets by mouth daily as needed for pain (for migraines).     b complex vitamins tablet  Take 1 tablet by mouth 2 (two) times daily.     CALCIUM 600 + D 600-200 MG-UNIT Tabs  Generic drug:  Calcium Carb-Cholecalciferol  Take 1 tablet by mouth 2 (two) times daily.     cefUROXime 500 MG tablet  Commonly known as:  CEFTIN  Take 1 tablet (500 mg total) by mouth 2 (two) times daily with a meal. For 2 more days     divalproex 125 MG DR tablet  Commonly known as:  DEPAKOTE  Take 375 mg by mouth 2 (two) times daily.     multivitamin with minerals Tabs tablet  Take 1 tablet by mouth daily.     Vitamin D 2000 UNITS tablet  Take 2,000 Units by mouth daily.          Diet and Activity recommendation: See Discharge Instructions above   Consults obtained - Neuro   Major procedures and Radiology Reports - PLEASE review detailed and final reports for all details, in brief -     Dg Chest 2 View  08/29/2013   CLINICAL DATA:  Weakness.  Stroke.  EXAM: CHEST  2 VIEW  COMPARISON:  08/27/2013  FINDINGS: Cardiac silhouette is normal in size. The aorta is mildly uncoiled. No mediastinal or hilar masses. Clear lungs. No pleural effusion. No pneumothorax. The bony  thorax is intact.  IMPRESSION: No active cardiopulmonary disease.   Electronically Signed   By: Amie Portland M.D.   On: 08/29/2013 01:23   Dg Chest 2 View  07/31/2013   CLINICAL DATA:  Weakness  EXAM: CHEST  2 VIEW  COMPARISON:  03/09/2013  FINDINGS: The heart size and mediastinal contours  are within normal limits. Both lungs are clear. The visualized skeletal structures are unremarkable.  IMPRESSION: No active cardiopulmonary disease.   Electronically Signed   By: Marlan Palau M.D.   On: 07/31/2013 12:36   Ct Head Wo Contrast  08/24/2013   CLINICAL DATA:  Confusion.  Altered mental status.  EXAM: CT HEAD WITHOUT CONTRAST  TECHNIQUE: Contiguous axial images were obtained from the base of the skull through the vertex without contrast.  COMPARISON:  Head CT 07/31/2013 and MRI 08/11/2013  FINDINGS: Again noted is diffuse cerebral atrophy. There is stable low-density in the periventricular and subcortical white matter. Old infarct involving the right white matter tract. Subtle low-density along the right corpus callosum is probably related to the recently diagnosed infarct. No evidence for acute hemorrhage, mass lesion, midline shift or hydrocephalus. No acute bone abnormality. Again noted is frothy material in the right sphenoid sinus.  IMPRESSION: No acute intracranial abnormality.  Atrophy and evidence for old ischemic changes. Subtle low-density in the region of the right corpus callosum probably related to the recent infarct.   Electronically Signed   By: Richarda Overlie M.D.   On: 08/24/2013 16:58   Ct Head Wo Contrast  07/31/2013   CLINICAL DATA:  Left-sided weakness  EXAM: CT HEAD WITHOUT CONTRAST  TECHNIQUE: Contiguous axial images were obtained from the base of the skull through the vertex without intravenous contrast.  COMPARISON:  MRI 03/10/2013  FINDINGS: Moderate atrophy. Moderate chronic microvascular ischemic changes in the white matter. Chronic lacunar infarction in the deep white matter on the right extending into the putamen. Small chronic infarct left putamen and right cerebellum.  Negative for acute infarct. MRI may be helpful in this setting. Negative for hemorrhage or mass.  Mild mucosal thickening in the sphenoid sinus.  IMPRESSION: Atrophy and chronic microvascular ischemia.  No  acute abnormality.   Electronically Signed   By: Marlan Palau M.D.   On: 07/31/2013 12:35   Mr Maxine Glenn Head Wo Contrast  08/29/2013   CLINICAL DATA:  Left-sided weakness.  Recent stroke.  EXAM: MRA HEAD WITHOUT CONTRAST  TECHNIQUE: Angiographic images of the Circle of Willis were obtained using MRA technique without intravenous contrast.  COMPARISON:  MRI 08/24/2013.  MRI 02/06/2013  FINDINGS: Both internal carotid arteries are widely patent through the siphon regions. The anterior and middle cerebral vessels are patent without proximal stenosis, aneurysm or vascular malformation. More distal branch vessels show atherosclerotic irregularity, most notable in the anterior cerebral arteries an the left parietal branches of the left MCA.  Both vertebral arteries are widely patent to the basilar. No basilar stenosis. Posterior circulation branch vessels are patent. There is some narrowing and irregularity of the posterior inferior cerebellar artery branches and the posterior cerebral artery branches.  IMPRESSION: No major vessel occlusion or correctable proximal stenosis. More distal vessel atherosclerotic irregularity and narrowing noted in the anterior cerebral arteries bilaterally, the distal MCA branches (particularly left parietal) and the distal branches of the posterior inferior cerebellar arteries and posterior cerebral arteries. Findings are similar to previous studies.   Electronically Signed   By: Paulina Fusi M.D.   On: 08/29/2013 13:09  Mr Brain Wo Contrast  08/24/2013   CLINICAL DATA:  Altered mental status. Extremity weakness. Known infarct 2 weeks ago. Hypertension.  EXAM: MRI HEAD WITHOUT CONTRAST  TECHNIQUE: Multiplanar, multiecho pulse sequences of the brain and surrounding structures were obtained without intravenous contrast.  COMPARISON:  08/24/2013 CT.  08/11/2013 MR.  FINDINGS: Slight increase in size of right anterior cerebral artery distribution acute infarct (medial aspect the right frontal  lobe) when compared to the 08/11/2013 exam.  No intracranial hemorrhage.  Remote infarct posterior right external capsule extending into posterior right coronal radiata.  Remote small right cerebellar infarcts.  Remote left periatrial infarct.  Prominent small vessel disease type changes.  Global atrophy without hydrocephalus.  No intracranial mass lesion noted on this unenhanced exam.  Major intracranial vascular structures are patent.  IMPRESSION: Slight increase in size of right anterior cerebral artery distribution acute infarct (medial aspect the right frontal lobe) when compared to the 08/11/2013 exam.  Remote infarcts and small vessel disease type changes as detailed above.   Electronically Signed   By: Chauncey Cruel M.D.   On: 08/24/2013 21:04   Mr Brain Wo Contrast  08/11/2013   CLINICAL DATA:  Left leg weakness.  Confusion.  EXAM: MRI HEAD WITHOUT CONTRAST  TECHNIQUE: Multiplanar, multiecho pulse sequences of the brain and surrounding structures were obtained without intravenous contrast.  COMPARISON:  CT 07/31/2013.  MRI 03/10/2013  FINDINGS: Acute infarct involving the right corpus callosum throughout the body and anterior genu. Mild amount of acute infarct in the right frontal white matter above the corpus callosum.  Generalized atrophy. Chronic ischemia throughout the white matter. Chronic infarct in the centrum semiovale on the right. Mild chronic ischemia in the pons and right cerebellum.  Negative for intracranial hemorrhage. Negative for mass or edema. No shift of the midline structures.  Paranasal sinuses are clear.  IMPRESSION: Acute infarct involving the right corpus callosum and right frontal white matter.  Atrophy and extensive chronic ischemia.   Electronically Signed   By: Franchot Gallo M.D.   On: 08/11/2013 14:27   Mr Lumbar Spine Wo Contrast  08/24/2013   CLINICAL DATA:  Lower extremity weakness.  EXAM: MRI LUMBAR SPINE WITHOUT CONTRAST  TECHNIQUE: Multiplanar, multisequence MR  imaging of the lumbar spine was performed. No intravenous contrast was administered.  COMPARISON:  02/13/2013 lumbar spine MR.  FINDINGS: Last fully open disk space is labeled L5-S1. Present examination incorporates from T11-12 disc space through the lower sacrum.  Conus L1-2 level.  Mild atherosclerotic type changes of the aorta are. Visualized paravertebral structures otherwise unremarkable.  T11-12: Minimal bulge.  T12-L1: Tiny central protrusion with slight caudal extension. No associated mass effect.  L1-2: Small Schmorl's node deformity. Minimal bulge. Mild facet joint degenerative changes.  L2-3: Mild to moderate facet joint degenerative changes. Ligamentum flavum hypertrophy greater on left. Bulge. Mild to moderate spinal stenosis.  L3-4: Prominent facet joint degenerative changes. Ligamentum flavum hypertrophy. Tiny synovial cyst. Mild retrolisthesis L3. Bulge. Multifactorial moderate to marked spinal stenosis and lateral recess stenosis. Mild bilateral foraminal narrowing.  L4-5: Right greater left facet joint degenerative changes and ligamentum flavum hypertrophy. Minimal retrolisthesis L4. Bulge. Mild spinal stenosis. Mild right lateral recess stenosis. Mild foraminal narrowing bilaterally.  L5-S1: Central spur with indentation central aspect of the thecal sac without nerve root compression. Mild facet joint degenerative changes.  IMPRESSION: No significant change from the prior examination with most notable findings at the L3-4 level where there is multifactorial moderate to marked spinal stenosis  and lateral recess stenosis.  Please see above for further detail.   Electronically Signed   By: Chauncey Cruel M.D.   On: 08/24/2013 21:28   Dg Chest Port 1 View  08/27/2013   CLINICAL DATA:  Left-sided weakness.  EXAM: PORTABLE CHEST - 1 VIEW  COMPARISON:  Chest x-ray 07/31/2013.  FINDINGS: Lung volumes are normal. No consolidative airspace disease. No pleural effusions. No pneumothorax. No pulmonary nodule  or mass noted. Pulmonary vasculature and the cardiomediastinal silhouette are within normal limits. Atherosclerosis in the thoracic aorta.  IMPRESSION: 1. No radiographic evidence of acute cardiopulmonary disease. 2. Atherosclerosis.   Electronically Signed   By: Vinnie Langton M.D.   On: 08/27/2013 20:33    Micro Results      Recent Results (from the past 240 hour(s))  URINE CULTURE     Status: None   Collection Time    08/24/13 10:23 PM      Result Value Ref Range Status   Specimen Description URINE, CLEAN CATCH   Final   Special Requests NONE   Final   Culture  Setup Time     Final   Value: 08/25/2013 03:59     Performed at [facility]     Final   Value: >=100,000 COLONIES/ML     Performed at [facility]   Culture     Final   Value: Multiple bacterial morphotypes present, none predominant. Suggest appropriate recollection if clinically indicated.     Performed at [facility]   Report Status 08/26/2013 FINAL   Final  URINE CULTURE     Status: None   Collection Time    08/27/13  8:12 PM      Result Value Ref Range Status   Specimen Description URINE, CLEAN CATCH   Final   Special Requests NONE   Final   Culture  Setup Time     Final   Value: 08/28/2013 00:37     Performed at [facility]     Final   Value: 30,000 COLONIES/ML     Performed at [facility]   Culture     Final   Value: Multiple bacterial morphotypes present, none predominant. Suggest appropriate recollection if clinically indicated.     Performed at [facility]   Report Status 08/28/2013 FINAL   Final     History of present illness and  Hospital Course:     Kindly see H&P for history of present illness and admission details, please review complete Labs, Consult reports and Test reports for all details in brief CHARNEICE WEEDON, is a 80 y.o. female, patient with history of atrial fibrillation, history of CVA with chronic  left-sided hemiparesis, hypertension, migraine headaches, chronic anticoagulation was brought in to the hospital with acute on chronic worsening of her left-sided weakness.   For acute on chronic left-sided weakness she had outpatient MRI which showed a subacute stroke was likely progression of it in the last few days, she was seen by neurology and she was switched from [place] to Eliquis, no further stroke workup was recommended by neurology. She also had a UTI for which she is being treated. Currently recommendations are for her to be discharge to SNF on Eliquis in few more days of antibiotics for UTI treatment. Her LDL was less than 100 A1c was 5.8. No echo gram a carotid duplex was done this admission as recommended by neurology i.e. no further  stroke workup. Her left lower extremity weakness is improving, she will require continued PT at rehabilitation.      UTI. Specimen quality was poor we'll complete Ceftin treatment with 2 more days of antibiotic orally .    Afib/Flutter - from xaralto to Eliquis as recommended by neurology, she's not on any rate controlling agents.    HTN. Stable not on any blood pressure medications be    Today   Subjective:   Oluwafunmilayo Whipkey today has no headache,no chest abdominal pain,no new weakness tingling or numbness, feels much better .  Objective:   Blood pressure 131/74, pulse 70, temperature 97.5 F (36.4 C), temperature source Oral, resp. rate 16, height 5\' 7"  (1.702 m), weight 53.887 kg (118 lb 12.8 oz), SpO2 96.00%.   Intake/Output Summary (Last 24 hours) at 08/30/13 0937 Last data filed at 08/29/13 2223  Gross per 24 hour  Intake    120 ml  Output      0 ml  Net    120 ml    Exam Awake Alert, Oriented x 3, No new F.N deficits, chronic left-sided weakness strength 5/5 but still comparatively weak to the right side Normal affect .AT,PERRAL Supple Neck,No JVD, No cervical lymphadenopathy appriciated.  Symmetrical Chest wall movement,  Good air movement bilaterally, CTAB RRR,No Gallops,Rubs or new Murmurs, No Parasternal Heave +ve B.Sounds, Abd Soft, Non tender, No organomegaly appriciated, No rebound -guarding or rigidity. No Cyanosis, Clubbing or edema, No new Rash or bruise  Data Review   CBC w Diff: Lab Results  Component Value Date   WBC 4.3 08/29/2013   HGB 11.7* 08/29/2013   HCT 34.8* 08/29/2013   PLT 163 08/29/2013   LYMPHOPCT 32 08/27/2013   MONOPCT 20* 08/27/2013   EOSPCT 2 08/27/2013   BASOPCT 0 08/27/2013    CMP: Lab Results  Component Value Date   NA 145 08/29/2013   K 4.3 08/29/2013   CL 107 08/29/2013   CO2 29 08/29/2013   BUN 20 08/29/2013   CREATININE 0.95 08/29/2013   PROT 6.2 08/27/2013   ALBUMIN 3.2* 08/27/2013   BILITOT 0.2* 08/27/2013   ALKPHOS 70 08/27/2013   AST 22 08/27/2013   ALT 12 08/27/2013  . Lab Results  Component Value Date   HGBA1C 5.8* 08/28/2013   Lab Results  Component Value Date   CHOL 166 08/28/2013   HDL 68 08/28/2013   LDLCALC 84 08/28/2013   TRIG 68 08/28/2013   CHOLHDL 2.4 08/28/2013     Total Time in preparing paper work, data evaluation and todays exam - 35 minutes  Thurnell Lose M.D on 08/30/2013 at 9:37 AM  [facility] Office  [phone]   **Disclaimer: This note may have been dictated with voice recognition software. Similar sounding words can inadvertently be transcribed and this note may contain transcription errors which may not have been corrected upon publication of note.**

## 2013-08-31 NOTE — Telephone Encounter (Signed)
Appointment scheduled for 09/05/13

## 2013-09-03 ENCOUNTER — Ambulatory Visit: Payer: Medicare Other | Admitting: Family

## 2013-09-04 ENCOUNTER — Encounter: Payer: Medicare Other | Admitting: Occupational Therapy

## 2013-09-04 ENCOUNTER — Telehealth: Payer: Self-pay | Admitting: Neurology

## 2013-09-04 ENCOUNTER — Ambulatory Visit: Payer: Medicare Other | Admitting: Rehabilitative and Restorative Service Providers"

## 2013-09-04 DIAGNOSIS — I4891 Unspecified atrial fibrillation: Secondary | ICD-10-CM | POA: Diagnosis not present

## 2013-09-04 DIAGNOSIS — F0390 Unspecified dementia without behavioral disturbance: Secondary | ICD-10-CM | POA: Diagnosis not present

## 2013-09-04 DIAGNOSIS — I6789 Other cerebrovascular disease: Secondary | ICD-10-CM | POA: Diagnosis not present

## 2013-09-04 DIAGNOSIS — E785 Hyperlipidemia, unspecified: Secondary | ICD-10-CM | POA: Diagnosis not present

## 2013-09-04 NOTE — Telephone Encounter (Signed)
Spoke to patient's daughter regarding rescheduling 09/19/13 appointment per Dr. Clydene Fake schedule, daughter verbalized understanding.

## 2013-09-05 ENCOUNTER — Encounter: Payer: Self-pay | Admitting: Family

## 2013-09-05 ENCOUNTER — Ambulatory Visit (INDEPENDENT_AMBULATORY_CARE_PROVIDER_SITE_OTHER): Payer: Medicare Other | Admitting: Family

## 2013-09-05 VITALS — BP 140/78 | HR 78 | Temp 97.7°F | Resp 16 | Ht 67.0 in | Wt 117.0 lb

## 2013-09-05 DIAGNOSIS — N390 Urinary tract infection, site not specified: Secondary | ICD-10-CM | POA: Diagnosis not present

## 2013-09-05 DIAGNOSIS — Z8673 Personal history of transient ischemic attack (TIA), and cerebral infarction without residual deficits: Secondary | ICD-10-CM | POA: Diagnosis not present

## 2013-09-05 NOTE — Patient Instructions (Signed)
Please follow up in 2 months. 

## 2013-09-05 NOTE — Assessment & Plan Note (Signed)
Clinically resolved. She has had 2 recent uti's. We discussed possibility of adding prophylactic antibiotic if she has further UTI's.  Will monitor for now.

## 2013-09-05 NOTE — Progress Notes (Signed)
Pre visit review using our clinic review tool, if applicable. No additional management support is needed unless otherwise documented below in the visit note. 

## 2013-09-05 NOTE — Progress Notes (Signed)
Subjective:    Patient ID: [name], female    DOB: 1924-11-14, 80 y.o.   MRN: [id number]  HPI  [name] is an 80 yr old female who presents today with [name] daughters for hospital follow up. [name] is currently at [facility] for rehab.  [name] was admitted 6/1-08/30/13. Records are reviewed.  [name] presented with slurred speech and left sided weakness.  Admission diagnosis was CVA which was felt to be subacute at the time of the admission. Neurology was consulted and it was felt that there were limited treatment options other than continuing anticoagulation and physical therapy.  [name] A1C was noted to be mildly elevated (5.8).  LDL is at goal.  Daughters feel that [name] neuro status worsened in the setting of UTI and now is returning closer to baseline. Notes though some cognitive deficits since [name] most recent CVA- but mild.   Lab Results  Component Value Date   CHOL 166 08/28/2013   HDL 68 08/28/2013   LDLCALC 84 08/28/2013   TRIG 68 08/28/2013   CHOLHDL 2.4 08/28/2013   AF- [name] is maintained on Eliquis.    UTI- [name] was treated with ceftin which was completed on 6/6. Denies dysuria or frequency.      Review of Systems See HPI  Past Medical History  Diagnosis Date  . Arthritis   . Hypertension   . Migraine   . Colon polyp   . Hearing difficulty   . Torus palatinus   . Atrial flutter January, 2012  . Stroke 08/02/12     right lenticular nucleus infarct  . Left leg weakness 02/05/2013  . Chronic anticoagulation   . High cholesterol   . Atrial fibrillation     History   Social History  . Marital Status: Widowed    Spouse Name: N/A    Number of Children: 3  . Years of Education: college   Occupational History  . editorial work for [employer]    Social History Main Topics  . Smoking status: Former Research scientist (life sciences)  . Smokeless tobacco: Never Used  . Alcohol Use: 0.0 oz/week     Comment: 08/01/2012 "glass of wine on special occasions"  . Drug Use: No  . Sexual Activity: No    Other Topics Concern  . Not on file   Social History Narrative   Patient lives at home with daughters.   Caffeine use: 1/2-1 cup of coffee occasionally    Past Surgical History  Procedure Laterality Date  . Cataract extraction w/ intraocular lens  implant, bilateral  2006-2008  . Ganglion cyst excision Bilateral 1938,1954,2003,2005    "wrists/hand" (08/01/2012)  . Cardioversion  05/19/2010    Dr. Einar Gip  . Tonsillectomy  ~ 1935  . Appendectomy  02/19/53    `  . Mouth surgery      [name]    Family History  Problem Relation Age of Onset  . Colon cancer    . Breast cancer    . Brain cancer    . Cancer Mother     breast  . Aneurysm Mother     brain  . Heart attack Neg Hx   . Diabetes Neg Hx   . Hypertension Neg Hx     Allergies  Allergen Reactions  . Tramadol Hcl Other (See Comments)     lethargy, nausea  . Alendronate Sodium Rash and Other (See Comments)    puffy eyes    Current Outpatient Prescriptions on File Prior to Visit  Medication Sig Dispense Refill  . acetaminophen (TYLENOL) 500 MG tablet Take 1,000 mg by mouth daily as needed for mild pain or headache.      Marland Kitchen apixaban (ELIQUIS) 2.5 MG TABS tablet Take 1 tablet (2.5 mg total) by mouth 2 (two) times daily.  60 tablet    . aspirin-acetaminophen-caffeine (EXCEDRIN MIGRAINE) 250-250-65 MG per tablet Take 2 tablets by mouth daily as needed for pain (for migraines).      Marland Kitchen b complex vitamins tablet Take 1 tablet by mouth 2 (two) times daily.       . Calcium Carb-Cholecalciferol (CALCIUM 600 + D) 600-200 MG-UNIT TABS Take 1 tablet by mouth 2 (two) times daily.      . cefUROXime (CEFTIN) 500 MG tablet Take 1 tablet (500 mg total) by mouth 2 (two) times daily with a meal. For 2 more days      . Cholecalciferol (VITAMIN D) 2000 UNITS tablet Take 2,000 Units by mouth daily.      . divalproex (DEPAKOTE) 125 MG DR tablet Take 375 mg by mouth 2 (two) times daily.      . Multiple Vitamin (MULTIVITAMIN WITH MINERALS) TABS  Take 1 tablet by mouth daily.       No current facility-administered medications on file prior to visit.    Ht 5\' 7"  (1.702 m)  Wt 117 lb (53.071 kg)  BMI 18.32 kg/m2       Objective:   Physical Exam  Constitutional: [name] is oriented to person, place, and time.  Thin, white female in NAD  HENT:  Head: Normocephalic and atraumatic.  Cardiovascular: Normal rate.  An irregular rhythm present.  No murmur heard. Pulmonary/Chest: Effort normal and breath sounds normal. No respiratory distress. [name] has no wheezes. [name] has no rales. [name] exhibits no tenderness.  Neurological: [name] is alert and oriented to person, place, and time.  RLE 5/5, LLE 4-5/5 Speech is clear Bilateral UE strength is 5/5.  Skin: Skin is warm and dry.  Psychiatric: [name] has a normal mood and affect. [name] behavior is normal. Judgment and thought content normal.          Assessment & Plan:

## 2013-09-05 NOTE — Assessment & Plan Note (Signed)
Continue eliquis, PT/OT at skilled facility. Lipids are at goal.

## 2013-09-19 ENCOUNTER — Ambulatory Visit: Payer: Medicare Other | Admitting: Neurology

## 2013-09-25 DIAGNOSIS — F0390 Unspecified dementia without behavioral disturbance: Secondary | ICD-10-CM | POA: Diagnosis not present

## 2013-09-25 DIAGNOSIS — I4891 Unspecified atrial fibrillation: Secondary | ICD-10-CM | POA: Diagnosis not present

## 2013-09-25 DIAGNOSIS — M6281 Muscle weakness (generalized): Secondary | ICD-10-CM | POA: Diagnosis not present

## 2013-09-27 DIAGNOSIS — I10 Essential (primary) hypertension: Secondary | ICD-10-CM | POA: Diagnosis not present

## 2013-09-27 DIAGNOSIS — Z7901 Long term (current) use of anticoagulants: Secondary | ICD-10-CM | POA: Diagnosis not present

## 2013-09-27 DIAGNOSIS — Z9181 History of falling: Secondary | ICD-10-CM | POA: Diagnosis not present

## 2013-09-27 DIAGNOSIS — Z5189 Encounter for other specified aftercare: Secondary | ICD-10-CM | POA: Diagnosis not present

## 2013-09-27 DIAGNOSIS — I69959 Hemiplegia and hemiparesis following unspecified cerebrovascular disease affecting unspecified side: Secondary | ICD-10-CM | POA: Diagnosis not present

## 2013-09-27 DIAGNOSIS — Z79899 Other long term (current) drug therapy: Secondary | ICD-10-CM | POA: Diagnosis not present

## 2013-09-27 DIAGNOSIS — F0390 Unspecified dementia without behavioral disturbance: Secondary | ICD-10-CM | POA: Diagnosis not present

## 2013-09-27 DIAGNOSIS — G43909 Migraine, unspecified, not intractable, without status migrainosus: Secondary | ICD-10-CM | POA: Diagnosis not present

## 2013-09-27 DIAGNOSIS — I4891 Unspecified atrial fibrillation: Secondary | ICD-10-CM | POA: Diagnosis not present

## 2013-09-27 DIAGNOSIS — Z87440 Personal history of urinary (tract) infections: Secondary | ICD-10-CM | POA: Diagnosis not present

## 2013-10-01 ENCOUNTER — Ambulatory Visit: Payer: Medicare Other | Admitting: Family

## 2013-10-01 DIAGNOSIS — Z5189 Encounter for other specified aftercare: Secondary | ICD-10-CM | POA: Diagnosis not present

## 2013-10-01 DIAGNOSIS — I69959 Hemiplegia and hemiparesis following unspecified cerebrovascular disease affecting unspecified side: Secondary | ICD-10-CM | POA: Diagnosis not present

## 2013-10-01 DIAGNOSIS — I10 Essential (primary) hypertension: Secondary | ICD-10-CM | POA: Diagnosis not present

## 2013-10-01 DIAGNOSIS — G43909 Migraine, unspecified, not intractable, without status migrainosus: Secondary | ICD-10-CM | POA: Diagnosis not present

## 2013-10-01 DIAGNOSIS — I4891 Unspecified atrial fibrillation: Secondary | ICD-10-CM | POA: Diagnosis not present

## 2013-10-01 DIAGNOSIS — F0390 Unspecified dementia without behavioral disturbance: Secondary | ICD-10-CM | POA: Diagnosis not present

## 2013-10-04 DIAGNOSIS — I10 Essential (primary) hypertension: Secondary | ICD-10-CM | POA: Diagnosis not present

## 2013-10-04 DIAGNOSIS — Z5189 Encounter for other specified aftercare: Secondary | ICD-10-CM | POA: Diagnosis not present

## 2013-10-04 DIAGNOSIS — F0390 Unspecified dementia without behavioral disturbance: Secondary | ICD-10-CM | POA: Diagnosis not present

## 2013-10-04 DIAGNOSIS — G43909 Migraine, unspecified, not intractable, without status migrainosus: Secondary | ICD-10-CM | POA: Diagnosis not present

## 2013-10-04 DIAGNOSIS — I4891 Unspecified atrial fibrillation: Secondary | ICD-10-CM | POA: Diagnosis not present

## 2013-10-04 DIAGNOSIS — I69959 Hemiplegia and hemiparesis following unspecified cerebrovascular disease affecting unspecified side: Secondary | ICD-10-CM | POA: Diagnosis not present

## 2013-10-05 ENCOUNTER — Telehealth: Payer: Self-pay | Admitting: *Deleted

## 2013-10-05 NOTE — Telephone Encounter (Signed)
Received call from Tommi Emery, [place] with [facility] requesting to do OT once a week x 1 wk, twice a week x 2 weeks and once a week x 2 weeks for exercise and mobility, transfers, functional mobility, adaptive equipment training, ADLs and motor control program. Gave verbal authorization to proceed.

## 2013-10-08 DIAGNOSIS — I10 Essential (primary) hypertension: Secondary | ICD-10-CM | POA: Diagnosis not present

## 2013-10-08 DIAGNOSIS — F0390 Unspecified dementia without behavioral disturbance: Secondary | ICD-10-CM | POA: Diagnosis not present

## 2013-10-08 DIAGNOSIS — G43909 Migraine, unspecified, not intractable, without status migrainosus: Secondary | ICD-10-CM | POA: Diagnosis not present

## 2013-10-08 DIAGNOSIS — Z5189 Encounter for other specified aftercare: Secondary | ICD-10-CM | POA: Diagnosis not present

## 2013-10-08 DIAGNOSIS — I69959 Hemiplegia and hemiparesis following unspecified cerebrovascular disease affecting unspecified side: Secondary | ICD-10-CM | POA: Diagnosis not present

## 2013-10-08 DIAGNOSIS — I4891 Unspecified atrial fibrillation: Secondary | ICD-10-CM | POA: Diagnosis not present

## 2013-10-09 DIAGNOSIS — I10 Essential (primary) hypertension: Secondary | ICD-10-CM | POA: Diagnosis not present

## 2013-10-09 DIAGNOSIS — F0390 Unspecified dementia without behavioral disturbance: Secondary | ICD-10-CM | POA: Diagnosis not present

## 2013-10-09 DIAGNOSIS — G43909 Migraine, unspecified, not intractable, without status migrainosus: Secondary | ICD-10-CM | POA: Diagnosis not present

## 2013-10-09 DIAGNOSIS — I4891 Unspecified atrial fibrillation: Secondary | ICD-10-CM | POA: Diagnosis not present

## 2013-10-09 DIAGNOSIS — I69959 Hemiplegia and hemiparesis following unspecified cerebrovascular disease affecting unspecified side: Secondary | ICD-10-CM | POA: Diagnosis not present

## 2013-10-09 DIAGNOSIS — Z5189 Encounter for other specified aftercare: Secondary | ICD-10-CM | POA: Diagnosis not present

## 2013-10-10 ENCOUNTER — Emergency Department (HOSPITAL_BASED_OUTPATIENT_CLINIC_OR_DEPARTMENT_OTHER): Payer: Medicare Other

## 2013-10-10 ENCOUNTER — Emergency Department (HOSPITAL_BASED_OUTPATIENT_CLINIC_OR_DEPARTMENT_OTHER)
Admission: EM | Admit: 2013-10-10 | Discharge: 2013-10-10 | Disposition: A | Discharge status: Home / Self Care | Payer: Medicare Other | Attending: Emergency Medicine | Admitting: Emergency Medicine

## 2013-10-10 ENCOUNTER — Encounter (HOSPITAL_BASED_OUTPATIENT_CLINIC_OR_DEPARTMENT_OTHER): Payer: Self-pay | Admitting: Emergency Medicine

## 2013-10-10 DIAGNOSIS — G43909 Migraine, unspecified, not intractable, without status migrainosus: Secondary | ICD-10-CM | POA: Diagnosis not present

## 2013-10-10 DIAGNOSIS — I10 Essential (primary) hypertension: Secondary | ICD-10-CM | POA: Insufficient documentation

## 2013-10-10 DIAGNOSIS — Z87891 Personal history of nicotine dependence: Secondary | ICD-10-CM | POA: Diagnosis not present

## 2013-10-10 DIAGNOSIS — Z5189 Encounter for other specified aftercare: Secondary | ICD-10-CM | POA: Diagnosis not present

## 2013-10-10 DIAGNOSIS — M278 Other specified diseases of jaws: Secondary | ICD-10-CM | POA: Insufficient documentation

## 2013-10-10 DIAGNOSIS — I4891 Unspecified atrial fibrillation: Secondary | ICD-10-CM | POA: Diagnosis not present

## 2013-10-10 DIAGNOSIS — Z860100 Personal history of colon polyps, unspecified: Secondary | ICD-10-CM | POA: Insufficient documentation

## 2013-10-10 DIAGNOSIS — R55 Syncope and collapse: Secondary | ICD-10-CM

## 2013-10-10 DIAGNOSIS — Z792 Long term (current) use of antibiotics: Secondary | ICD-10-CM | POA: Insufficient documentation

## 2013-10-10 DIAGNOSIS — Z8673 Personal history of transient ischemic attack (TIA), and cerebral infarction without residual deficits: Secondary | ICD-10-CM | POA: Insufficient documentation

## 2013-10-10 DIAGNOSIS — I69959 Hemiplegia and hemiparesis following unspecified cerebrovascular disease affecting unspecified side: Secondary | ICD-10-CM | POA: Diagnosis not present

## 2013-10-10 DIAGNOSIS — N390 Urinary tract infection, site not specified: Secondary | ICD-10-CM | POA: Insufficient documentation

## 2013-10-10 DIAGNOSIS — R5383 Other fatigue: Secondary | ICD-10-CM | POA: Diagnosis not present

## 2013-10-10 DIAGNOSIS — R404 Transient alteration of awareness: Secondary | ICD-10-CM | POA: Diagnosis present

## 2013-10-10 DIAGNOSIS — F0390 Unspecified dementia without behavioral disturbance: Secondary | ICD-10-CM | POA: Diagnosis not present

## 2013-10-10 DIAGNOSIS — Z79899 Other long term (current) drug therapy: Secondary | ICD-10-CM | POA: Insufficient documentation

## 2013-10-10 DIAGNOSIS — M129 Arthropathy, unspecified: Secondary | ICD-10-CM | POA: Insufficient documentation

## 2013-10-10 DIAGNOSIS — R5381 Other malaise: Secondary | ICD-10-CM | POA: Diagnosis not present

## 2013-10-10 LAB — URINE MICROSCOPIC-ADD ON

## 2013-10-10 LAB — COMPREHENSIVE METABOLIC PANEL
ALBUMIN: 3 g/dL — AB (ref 3.5–5.2)
ALT: 10 U/L (ref 0–35)
AST: 21 U/L (ref 0–37)
Alkaline Phosphatase: 50 U/L (ref 39–117)
Anion gap: 12 (ref 5–15)
BUN: 17 mg/dL (ref 6–23)
CALCIUM: 9.4 mg/dL (ref 8.4–10.5)
CO2: 28 meq/L (ref 19–32)
CREATININE: 0.9 mg/dL (ref 0.50–1.10)
Chloride: 104 mEq/L (ref 96–112)
GFR calc Af Amer: 65 mL/min — ABNORMAL LOW (ref >90)
GFR, EST NON AFRICAN AMERICAN: 56 mL/min — AB (ref >90)
Glucose, Bld: 153 mg/dL — ABNORMAL HIGH (ref 70–99)
Potassium: 4.3 mEq/L (ref 3.7–5.3)
SODIUM: 144 meq/L (ref 137–147)
TOTAL PROTEIN: 6.1 g/dL (ref 6.0–8.3)
Total Bilirubin: 0.4 mg/dL (ref 0.3–1.2)

## 2013-10-10 LAB — URINALYSIS, ROUTINE W REFLEX MICROSCOPIC
Bilirubin Urine: NEGATIVE
GLUCOSE, UA: NEGATIVE mg/dL
Hgb urine dipstick: NEGATIVE
Ketones, ur: 15 mg/dL — AB
Nitrite: NEGATIVE
Protein, ur: 30 mg/dL — AB
SPECIFIC GRAVITY, URINE: 1.024 (ref 1.005–1.030)
UROBILINOGEN UA: 0.2 mg/dL (ref 0.0–1.0)
pH: 6.5 (ref 5.0–8.0)

## 2013-10-10 LAB — CBC WITH DIFFERENTIAL/PLATELET
BASOS PCT: 0 % (ref 0–1)
Basophils Absolute: 0 10*3/uL (ref 0.0–0.1)
EOS ABS: 0.1 10*3/uL (ref 0.0–0.7)
Eosinophils Relative: 1 % (ref 0–5)
HEMATOCRIT: 37.4 % (ref 36.0–46.0)
Hemoglobin: 12.1 g/dL (ref 12.0–15.0)
Lymphocytes Relative: 18 % (ref 12–46)
Lymphs Abs: 1.1 10*3/uL (ref 0.7–4.0)
MCH: 31.3 pg (ref 26.0–34.0)
MCHC: 32.4 g/dL (ref 30.0–36.0)
MCV: 96.9 fL (ref 78.0–100.0)
MONO ABS: 0.8 10*3/uL (ref 0.1–1.0)
Monocytes Relative: 14 % — ABNORMAL HIGH (ref 3–12)
Neutro Abs: 4 10*3/uL (ref 1.7–7.7)
Neutrophils Relative %: 67 % (ref 43–77)
PLATELETS: 159 10*3/uL (ref 150–400)
RBC: 3.86 MIL/uL — ABNORMAL LOW (ref 3.87–5.11)
RDW: 14.2 % (ref 11.5–15.5)
WBC: 6 10*3/uL (ref 4.0–10.5)

## 2013-10-10 LAB — CBG MONITORING, ED: Glucose-Capillary: 141 mg/dL — ABNORMAL HIGH (ref 70–99)

## 2013-10-10 LAB — VALPROIC ACID LEVEL: VALPROIC ACID LVL: 68.7 ug/mL (ref 50.0–100.0)

## 2013-10-10 LAB — TROPONIN I

## 2013-10-10 MED ORDER — CEPHALEXIN 500 MG PO CAPS: 500.0000 mg | ORAL_CAPSULE | Freq: Two times a day (BID) | ORAL | Status: DC

## 2013-10-10 MED ORDER — SODIUM CHLORIDE 0.9 % IV BOLUS (SEPSIS)
250.0000 mL | Freq: Once | INTRAVENOUS | Status: AC
  Administered 2013-10-10: 250 mL via INTRAVENOUS

## 2013-10-10 NOTE — ED Notes (Signed)
Pt was walking this morning and remembers her therapist telling her to "put her good foot down". Her family told ems that she then passed out and they helped her to the ground and that she was out for apprx. 1 minute. EMS reports pt's supine bp was 120/70 and her sitting bp was 110/80. Pt only c/o chronic back pain. She sts that otherwise, she feels ok.

## 2013-10-10 NOTE — ED Provider Notes (Signed)
CSN: [id number]     Arrival date & time 10/10/13  1119 History   First MD Initiated Contact with Patient 10/10/13 1133     Chief Complaint  Patient presents with  . Loss of Consciousness     (Consider location/radiation/quality/duration/timing/severity/associated sxs/prior Treatment) Patient is a 80 y.o. female presenting with syncope. The history is provided by the patient.  Loss of Consciousness Episode history:  Single Associated symptoms: weakness   Associated symptoms: no chest pain, no headaches, no nausea, no shortness of breath and no vomiting    patient presents after a syncopal episode. She been doing physical therapy and became lightheaded and passed out. No trauma. She woke up after being assisted to the ground. No seizure activity. No postictal period. No chest pain. No trouble breathing. She does have some chronic urinary frequency. She has a history of orthostatic hypotension. She also has a history of strokes and is on Eliquis. No headache. No confusion.  Past Medical History  Diagnosis Date  . Arthritis   . Hypertension   . Migraine   . Colon polyp   . Hearing difficulty   . Torus palatinus   . Atrial flutter January, 2012  . Stroke 08/02/12     right lenticular nucleus infarct  . Left leg weakness 02/05/2013  . Chronic anticoagulation   . High cholesterol   . Atrial fibrillation    Past Surgical History  Procedure Laterality Date  . Cataract extraction w/ intraocular lens  implant, bilateral  2006-2008  . Ganglion cyst excision Bilateral 1938,1954,2003,2005    "wrists/hand" (08/01/2012)  . Cardioversion  05/19/2010    Dr. Einar Gip  . Tonsillectomy  ~ 1935  . Appendectomy  02/19/53    `  . Mouth surgery      [name]   Family History  Problem Relation Age of Onset  . Colon cancer    . Breast cancer    . Brain cancer    . Cancer Mother     breast  . Aneurysm Mother     brain  . Heart attack Neg Hx   . Diabetes Neg Hx   . Hypertension Neg Hx    History   Substance Use Topics  . Smoking status: Former Research scientist (life sciences)  . Smokeless tobacco: Never Used  . Alcohol Use: 0.0 oz/week     Comment: 08/01/2012 "glass of wine on special occasions"   OB History   Grav Para Term Preterm Abortions TAB SAB Ect Mult Living                 Review of Systems  Constitutional: Negative for activity change and appetite change.  Eyes: Negative for pain.  Respiratory: Negative for chest tightness and shortness of breath.   Cardiovascular: Positive for syncope. Negative for chest pain and leg swelling.  Gastrointestinal: Negative for nausea, vomiting, abdominal pain and diarrhea.  Genitourinary: Positive for frequency. Negative for flank pain.  Musculoskeletal: Negative for back pain and neck stiffness.  Skin: Negative for rash.  Neurological: Positive for syncope and weakness. Negative for numbness and headaches.  Psychiatric/Behavioral: Negative for behavioral problems.      Allergies  Tramadol hcl and Alendronate sodium  Home Medications   Prior to Admission medications   Medication Sig Start Date End Date Taking? Authorizing Provider  acetaminophen (TYLENOL) 500 MG tablet Take 1,000 mg by mouth daily as needed for mild pain or headache.    Historical Provider, MD  apixaban (ELIQUIS) 2.5 MG TABS tablet Take 1 tablet (2.5 mg  total) by mouth 2 (two) times daily. 08/30/13   Thurnell Lose, MD  aspirin-acetaminophen-caffeine (EXCEDRIN MIGRAINE) [phone] MG per tablet Take 2 tablets by mouth daily as needed for pain (for migraines).    Historical Provider, MD  b complex vitamins tablet Take 1 tablet by mouth 2 (two) times daily.     Historical Provider, MD  Calcium Carb-Cholecalciferol (CALCIUM 600 + D) 600-200 MG-UNIT TABS Take 1 tablet by mouth 2 (two) times daily.    Historical Provider, MD  cefUROXime (CEFTIN) 500 MG tablet Take 1 tablet (500 mg total) by mouth 2 (two) times daily with a meal. For 2 more days 08/30/13   Thurnell Lose, MD  cephALEXin  (KEFLEX) 500 MG capsule Take 1 capsule (500 mg total) by mouth 2 (two) times daily. 10/10/13   Jasper Riling. Daziya Redmond, MD  Cholecalciferol (VITAMIN D) 2000 UNITS tablet Take 2,000 Units by mouth daily.    Historical Provider, MD  divalproex (DEPAKOTE) 125 MG DR tablet Take 375 mg by mouth 2 (two) times daily.    Historical Provider, MD  Multiple Vitamin (MULTIVITAMIN WITH MINERALS) TABS Take 1 tablet by mouth daily.    Historical Provider, MD   BP 113/67  Pulse 78  Temp(Src) 97.8 F (36.6 C) (Oral)  Resp 20  Wt 117 lb (53.071 kg)  SpO2 95% Physical Exam  Nursing note and vitals reviewed. Constitutional: She is oriented to person, place, and time. She appears well-developed and well-nourished.  HENT:  Head: Normocephalic and atraumatic.  Eyes: EOM are normal. Pupils are equal, round, and reactive to light.  Neck: Normal range of motion. Neck supple.  Cardiovascular: Normal rate and normal heart sounds.   No murmur heard. Pulmonary/Chest: Effort normal and breath sounds normal. No respiratory distress. She has no wheezes. She has no rales.  Abdominal: Soft. Bowel sounds are normal. She exhibits no distension. There is no tenderness. There is no rebound and no guarding.  Musculoskeletal: Normal range of motion.  Neurological: She is alert and oriented to person, place, and time.  Chronic left-sided weakness. At baseline per family member  Skin: Skin is warm and dry.  Psychiatric: She has a normal mood and affect. Her speech is normal.    ED Course  Procedures (including critical care time) Labs Review Labs Reviewed  CBC WITH DIFFERENTIAL - Abnormal; Notable for the following:    RBC 3.86 (*)    Monocytes Relative 14 (*)    All other components within normal limits  COMPREHENSIVE METABOLIC PANEL - Abnormal; Notable for the following:    Glucose, Bld 153 (*)    Albumin 3.0 (*)    GFR calc non Af Amer 56 (*)    GFR calc Af Amer 65 (*)    All other components within normal limits   URINALYSIS, ROUTINE W REFLEX MICROSCOPIC - Abnormal; Notable for the following:    Color, Urine AMBER (*)    APPearance CLOUDY (*)    Ketones, ur 15 (*)    Protein, ur 30 (*)    Leukocytes, UA MODERATE (*)    All other components within normal limits  URINE MICROSCOPIC-ADD ON - Abnormal; Notable for the following:    Bacteria, UA MANY (*)    Casts HYALINE CASTS (*)    All other components within normal limits  CBG MONITORING, ED - Abnormal; Notable for the following:    Glucose-Capillary 141 (*)    All other components within normal limits  TROPONIN I  VALPROIC ACID LEVEL  Imaging Review Dg Chest 2 View  10/10/2013   CLINICAL DATA:  Syncopal episode.  EXAM: CHEST  2 VIEW  COMPARISON:  09/17/2013.  FINDINGS: Mediastinum and hilar structures are normal. Cardiomegaly, normal pulmonary vascularity. No focal infiltrate. No pleural effusion or pneumothorax. Radiopacity noted over right upper lung is most likely secondary to overlying EKG lead. No acute osseous abnormality.  IMPRESSION: 1. Mild cardiomegaly.  No CHF. 2. No focal pulmonary abnormality.   Electronically Signed   By: Marcello Moores  Register   On: 10/10/2013 13:18     EKG Interpretation   Date/Time:  Wednesday October 10 2013 11:31:04 EDT Ventricular Rate:  78 PR Interval:    QRS Duration: 80 QT Interval:  372 QTC Calculation: 424 R Axis:   48 Text Interpretation:  Atrial flutter with variable A-V block Septal  infarct , age undetermined atrial flutter has returned Confirmed by  Alvino Chapel  MD, Ovid Curd [phone]) on 10/10/2013 12:31:01 PM      MDM   Final diagnoses:  Syncope, unspecified syncope type  Lower urinary tract infection    Patient with syncope. Has had history of same due to orthostasis in the past. Patient went work appears reassuring, except for urinary tract infection. His atrial flutter, which is chronic for the patient. No trauma due to fall. Initial be treated with antibiotics and discharged home. Cultures  have been reviewed. Patient and family members were instructed to follow with her nurse practitioner in 2 days for the results of the culture. Patient was given Keflex. She's had recent treatment with Cipro and cefuroxime    Jasper Riling. Alvino Chapel, MD 10/10/13 1423

## 2013-10-10 NOTE — ED Notes (Signed)
CBG 141.  Malon Kindle, RN notified.

## 2013-10-10 NOTE — ED Notes (Signed)
MD at bedside. 

## 2013-10-10 NOTE — Discharge Instructions (Signed)

## 2013-10-11 ENCOUNTER — Telehealth: Payer: Self-pay | Admitting: Family

## 2013-10-11 NOTE — Telephone Encounter (Signed)
Appointment scheduled for 10/19/13.

## 2013-10-11 NOTE — Telephone Encounter (Signed)
Please sched 1 week ED follow up.

## 2013-10-12 DIAGNOSIS — I69959 Hemiplegia and hemiparesis following unspecified cerebrovascular disease affecting unspecified side: Secondary | ICD-10-CM | POA: Diagnosis not present

## 2013-10-12 DIAGNOSIS — I10 Essential (primary) hypertension: Secondary | ICD-10-CM | POA: Diagnosis not present

## 2013-10-12 DIAGNOSIS — G43909 Migraine, unspecified, not intractable, without status migrainosus: Secondary | ICD-10-CM | POA: Diagnosis not present

## 2013-10-12 DIAGNOSIS — I4891 Unspecified atrial fibrillation: Secondary | ICD-10-CM | POA: Diagnosis not present

## 2013-10-12 DIAGNOSIS — Z5189 Encounter for other specified aftercare: Secondary | ICD-10-CM | POA: Diagnosis not present

## 2013-10-12 DIAGNOSIS — F0390 Unspecified dementia without behavioral disturbance: Secondary | ICD-10-CM | POA: Diagnosis not present

## 2013-10-15 DIAGNOSIS — I69959 Hemiplegia and hemiparesis following unspecified cerebrovascular disease affecting unspecified side: Secondary | ICD-10-CM | POA: Diagnosis not present

## 2013-10-15 DIAGNOSIS — I4891 Unspecified atrial fibrillation: Secondary | ICD-10-CM | POA: Diagnosis not present

## 2013-10-15 DIAGNOSIS — I10 Essential (primary) hypertension: Secondary | ICD-10-CM | POA: Diagnosis not present

## 2013-10-15 DIAGNOSIS — F0390 Unspecified dementia without behavioral disturbance: Secondary | ICD-10-CM | POA: Diagnosis not present

## 2013-10-15 DIAGNOSIS — G43909 Migraine, unspecified, not intractable, without status migrainosus: Secondary | ICD-10-CM | POA: Diagnosis not present

## 2013-10-15 DIAGNOSIS — Z5189 Encounter for other specified aftercare: Secondary | ICD-10-CM | POA: Diagnosis not present

## 2013-10-16 ENCOUNTER — Other Ambulatory Visit: Payer: Self-pay | Admitting: Internal Medicine

## 2013-10-16 DIAGNOSIS — G43909 Migraine, unspecified, not intractable, without status migrainosus: Secondary | ICD-10-CM | POA: Diagnosis not present

## 2013-10-16 DIAGNOSIS — I69959 Hemiplegia and hemiparesis following unspecified cerebrovascular disease affecting unspecified side: Secondary | ICD-10-CM | POA: Diagnosis not present

## 2013-10-16 DIAGNOSIS — I10 Essential (primary) hypertension: Secondary | ICD-10-CM | POA: Diagnosis not present

## 2013-10-16 DIAGNOSIS — Z5189 Encounter for other specified aftercare: Secondary | ICD-10-CM | POA: Diagnosis not present

## 2013-10-16 DIAGNOSIS — I4891 Unspecified atrial fibrillation: Secondary | ICD-10-CM | POA: Diagnosis not present

## 2013-10-16 DIAGNOSIS — F0390 Unspecified dementia without behavioral disturbance: Secondary | ICD-10-CM | POA: Diagnosis not present

## 2013-10-17 DIAGNOSIS — F0390 Unspecified dementia without behavioral disturbance: Secondary | ICD-10-CM | POA: Diagnosis not present

## 2013-10-17 DIAGNOSIS — I10 Essential (primary) hypertension: Secondary | ICD-10-CM | POA: Diagnosis not present

## 2013-10-17 DIAGNOSIS — I4891 Unspecified atrial fibrillation: Secondary | ICD-10-CM | POA: Diagnosis not present

## 2013-10-17 DIAGNOSIS — I69959 Hemiplegia and hemiparesis following unspecified cerebrovascular disease affecting unspecified side: Secondary | ICD-10-CM | POA: Diagnosis not present

## 2013-10-17 DIAGNOSIS — G43909 Migraine, unspecified, not intractable, without status migrainosus: Secondary | ICD-10-CM | POA: Diagnosis not present

## 2013-10-17 DIAGNOSIS — Z5189 Encounter for other specified aftercare: Secondary | ICD-10-CM | POA: Diagnosis not present

## 2013-10-19 ENCOUNTER — Other Ambulatory Visit: Payer: Self-pay | Admitting: Family

## 2013-10-19 ENCOUNTER — Encounter: Payer: Self-pay | Admitting: Family

## 2013-10-19 ENCOUNTER — Ambulatory Visit (INDEPENDENT_AMBULATORY_CARE_PROVIDER_SITE_OTHER): Payer: Medicare Other | Admitting: Family

## 2013-10-19 ENCOUNTER — Telehealth: Payer: Self-pay | Admitting: Family

## 2013-10-19 VITALS — BP 100/70 | HR 68 | Temp 98.0°F | Resp 16 | Ht 67.0 in | Wt 119.1 lb

## 2013-10-19 DIAGNOSIS — G459 Transient cerebral ischemic attack, unspecified: Secondary | ICD-10-CM

## 2013-10-19 DIAGNOSIS — Z23 Encounter for immunization: Secondary | ICD-10-CM

## 2013-10-19 DIAGNOSIS — I4891 Unspecified atrial fibrillation: Secondary | ICD-10-CM | POA: Diagnosis not present

## 2013-10-19 DIAGNOSIS — M949 Disorder of cartilage, unspecified: Secondary | ICD-10-CM | POA: Diagnosis not present

## 2013-10-19 DIAGNOSIS — N390 Urinary tract infection, site not specified: Secondary | ICD-10-CM

## 2013-10-19 DIAGNOSIS — M899 Disorder of bone, unspecified: Secondary | ICD-10-CM | POA: Diagnosis not present

## 2013-10-19 DIAGNOSIS — I69959 Hemiplegia and hemiparesis following unspecified cerebrovascular disease affecting unspecified side: Secondary | ICD-10-CM | POA: Diagnosis not present

## 2013-10-19 DIAGNOSIS — F0390 Unspecified dementia without behavioral disturbance: Secondary | ICD-10-CM | POA: Diagnosis not present

## 2013-10-19 DIAGNOSIS — G43909 Migraine, unspecified, not intractable, without status migrainosus: Secondary | ICD-10-CM | POA: Diagnosis not present

## 2013-10-19 DIAGNOSIS — I10 Essential (primary) hypertension: Secondary | ICD-10-CM | POA: Diagnosis not present

## 2013-10-19 DIAGNOSIS — Z5189 Encounter for other specified aftercare: Secondary | ICD-10-CM | POA: Diagnosis not present

## 2013-10-19 MED ORDER — APIXABAN 2.5 MG PO TABS: 2.5000 mg | ORAL_TABLET | Freq: Two times a day (BID) | ORAL | Status: DC

## 2013-10-19 NOTE — Progress Notes (Signed)
Subjective:    Patient ID: [name], female    DOB: 05-Feb-1925, 80 y.o.   MRN: [id number]  HPI  [name] is an 80 yr old female who presents today for ED follow up.   She was seen in the ED on 10/10/13 following a syncopal event.  Records are reviewed. UA noted moderate leuks. CBC, bmet, unremarkable with the exception of some mild hypoalbuminemia and mild hyperglycemia.  Valproic acid level was normal. Urine was not sent for culture. CXR showed no acute changes.   She has not had any syncope since she returned home from the ED. Does note that sometimes if sh gets up too quickly she has some brief dizziness and has been trying to slow down.     Review of Systems    see HPI  Past Medical History  Diagnosis Date  . Arthritis   . Hypertension   . Migraine   . Colon polyp   . Hearing difficulty   . Torus palatinus   . Atrial flutter January, 2012  . Stroke 08/02/12     right lenticular nucleus infarct  . Left leg weakness 02/05/2013  . Chronic anticoagulation   . High cholesterol   . Atrial fibrillation     History   Social History  . Marital Status: Widowed    Spouse Name: N/A    Number of Children: 3  . Years of Education: college   Occupational History  . editorial work for [employer]    Social History Main Topics  . Smoking status: Former Research scientist (life sciences)  . Smokeless tobacco: Never Used  . Alcohol Use: 0.0 oz/week     Comment: 08/01/2012 "glass of wine on special occasions"  . Drug Use: No  . Sexual Activity: No   Other Topics Concern  . Not on file   Social History Narrative   Patient lives at home with daughters.   Caffeine use: 1/2-1 cup of coffee occasionally    Past Surgical History  Procedure Laterality Date  . Cataract extraction w/ intraocular lens  implant, bilateral  2006-2008  . Ganglion cyst excision Bilateral 1938,1954,2003,2005    "wrists/hand" (08/01/2012)  . Cardioversion  05/19/2010    Dr. Einar Gip  . Tonsillectomy  ~ 1935  . Appendectomy   02/19/53    `  . Mouth surgery      [name]    Family History  Problem Relation Age of Onset  . Colon cancer    . Breast cancer    . Brain cancer    . Cancer Mother     breast  . Aneurysm Mother     brain  . Heart attack Neg Hx   . Diabetes Neg Hx   . Hypertension Neg Hx     Allergies  Allergen Reactions  . Tramadol Hcl Other (See Comments)     lethargy, nausea  . Alendronate Sodium Rash and Other (See Comments)    puffy eyes    Current Outpatient Prescriptions on File Prior to Visit  Medication Sig Dispense Refill  . acetaminophen (TYLENOL) 500 MG tablet Take 1,000 mg by mouth daily as needed for mild pain or headache.      Marland Kitchen apixaban (ELIQUIS) 2.5 MG TABS tablet Take 1 tablet (2.5 mg total) by mouth 2 (two) times daily.  60 tablet    . aspirin-acetaminophen-caffeine (EXCEDRIN MIGRAINE) 250-250-65 MG per tablet Take 2 tablets by mouth daily as needed for pain (for migraines).      Marland Kitchen b complex  vitamins tablet Take 1 tablet by mouth 2 (two) times daily.       . Calcium Carb-Cholecalciferol (CALCIUM 600 + D) 600-200 MG-UNIT TABS Take 1 tablet by mouth 2 (two) times daily.      . Cholecalciferol (VITAMIN D) 2000 UNITS tablet Take 2,000 Units by mouth daily.      . divalproex (DEPAKOTE) 125 MG DR tablet Take 375 mg by mouth 2 (two) times daily.      . Multiple Vitamin (MULTIVITAMIN WITH MINERALS) TABS Take 1 tablet by mouth daily.       No current facility-administered medications on file prior to visit.    BP 100/70  Pulse 68  Temp(Src) 98 F (36.7 C) (Oral)  Resp 16  Ht 5\' 7"  (1.702 m)  Wt 119 lb 1.9 oz (54.032 kg)  BMI 18.65 kg/m2  SpO2 95%    Objective:   Physical Exam  Constitutional: She is oriented to person, place, and time. She appears well-developed and well-nourished. No distress.  HENT:  Head: Normocephalic and atraumatic.  Cardiovascular: Normal rate and regular rhythm.   No murmur heard. Pulmonary/Chest: Effort normal and breath sounds normal. No  respiratory distress. She has no wheezes. She has no rales. She exhibits no tenderness.  Musculoskeletal: She exhibits no edema.  Neurological: She is alert and oriented to person, place, and time.  Psychiatric: She has a normal mood and affect. Her behavior is normal. Judgment and thought content normal.          Assessment & Plan:  Prevnar today.  Family was concerned re: memory problems.  She scored 26/30 on MMSE today.

## 2013-10-19 NOTE — Telephone Encounter (Signed)
Rx sent. Notified pt's daughter, [name] and she voices understanding.

## 2013-10-19 NOTE — Telephone Encounter (Signed)
Patient is requesting a refill of eliquis to be sent to [facility]

## 2013-10-19 NOTE — Telephone Encounter (Signed)
rx sent, however, going forward her cardiologist should be managing (Dr. Einar Gip)

## 2013-10-19 NOTE — Progress Notes (Signed)
Pre visit review using our clinic review tool, if applicable. No additional management support is needed unless otherwise documented below in the visit note. 

## 2013-10-19 NOTE — Telephone Encounter (Signed)
Do we prescribe this or should this come from cardiologist?

## 2013-10-19 NOTE — Assessment & Plan Note (Signed)
Family requests referral to Urology.  May benefit from antibiotic phrophylaxis. They also have concern about overactive bladder. Will arrange.

## 2013-10-19 NOTE — Telephone Encounter (Signed)
Could you please initiate prolia?  They would like to come back in 1 month for prolia.

## 2013-10-19 NOTE — Patient Instructions (Signed)
We will contact you once we obtain Prolia approval from your insurance. You will be contacted about your referral to the urologist.  Please let me know if you have not heard back in 1 week about this referral.

## 2013-10-19 NOTE — Telephone Encounter (Signed)
Patient daughter calling back in regarding this. She states patient has already missed two dosages of this and needs to be sent in. Please call patient when this has been sent in.

## 2013-10-19 NOTE — Assessment & Plan Note (Signed)
Family would like to proceed with prolia.  Will initiate. She experienced rash and puffy eyes with alendronate.

## 2013-10-23 DIAGNOSIS — G43909 Migraine, unspecified, not intractable, without status migrainosus: Secondary | ICD-10-CM | POA: Diagnosis not present

## 2013-10-23 DIAGNOSIS — F0390 Unspecified dementia without behavioral disturbance: Secondary | ICD-10-CM | POA: Diagnosis not present

## 2013-10-23 DIAGNOSIS — Z5189 Encounter for other specified aftercare: Secondary | ICD-10-CM | POA: Diagnosis not present

## 2013-10-23 DIAGNOSIS — I69959 Hemiplegia and hemiparesis following unspecified cerebrovascular disease affecting unspecified side: Secondary | ICD-10-CM | POA: Diagnosis not present

## 2013-10-23 DIAGNOSIS — I10 Essential (primary) hypertension: Secondary | ICD-10-CM | POA: Diagnosis not present

## 2013-10-23 DIAGNOSIS — I4891 Unspecified atrial fibrillation: Secondary | ICD-10-CM | POA: Diagnosis not present

## 2013-10-27 DIAGNOSIS — Z5189 Encounter for other specified aftercare: Secondary | ICD-10-CM | POA: Diagnosis not present

## 2013-10-27 DIAGNOSIS — F0390 Unspecified dementia without behavioral disturbance: Secondary | ICD-10-CM | POA: Diagnosis not present

## 2013-10-27 DIAGNOSIS — G43909 Migraine, unspecified, not intractable, without status migrainosus: Secondary | ICD-10-CM | POA: Diagnosis not present

## 2013-10-27 DIAGNOSIS — I10 Essential (primary) hypertension: Secondary | ICD-10-CM | POA: Diagnosis not present

## 2013-10-27 DIAGNOSIS — I69959 Hemiplegia and hemiparesis following unspecified cerebrovascular disease affecting unspecified side: Secondary | ICD-10-CM | POA: Diagnosis not present

## 2013-10-27 DIAGNOSIS — I4891 Unspecified atrial fibrillation: Secondary | ICD-10-CM | POA: Diagnosis not present

## 2013-10-29 NOTE — Telephone Encounter (Signed)
I have sent pt's info to Prolia for insurance verification and will notify you once I have a response. Thank you.

## 2013-10-30 DIAGNOSIS — Z5189 Encounter for other specified aftercare: Secondary | ICD-10-CM | POA: Diagnosis not present

## 2013-10-30 DIAGNOSIS — I4891 Unspecified atrial fibrillation: Secondary | ICD-10-CM | POA: Diagnosis not present

## 2013-10-30 DIAGNOSIS — F0390 Unspecified dementia without behavioral disturbance: Secondary | ICD-10-CM | POA: Diagnosis not present

## 2013-10-30 DIAGNOSIS — I10 Essential (primary) hypertension: Secondary | ICD-10-CM | POA: Diagnosis not present

## 2013-10-30 DIAGNOSIS — I69959 Hemiplegia and hemiparesis following unspecified cerebrovascular disease affecting unspecified side: Secondary | ICD-10-CM | POA: Diagnosis not present

## 2013-10-30 DIAGNOSIS — G43909 Migraine, unspecified, not intractable, without status migrainosus: Secondary | ICD-10-CM | POA: Diagnosis not present

## 2013-10-30 NOTE — Telephone Encounter (Signed)
I have rec'd pt's insurance verification for Prolia. [name] will have an estimated responsibility of $0 plus any applicable deductible for her injection.  I have sent a copy of this summary of benefits to be scanned into pt's chart. If you will send me your fax # I will also fax a copy of the summary of benefits to your attn. If you have any questions, please let me know. Thank you.

## 2013-10-31 ENCOUNTER — Telehealth: Payer: Self-pay | Admitting: Neurology

## 2013-10-31 NOTE — Telephone Encounter (Signed)
Patient would like [place] to start refilling Depakote rather than previous provider.

## 2013-10-31 NOTE — Telephone Encounter (Signed)
Medication has previously been prescribed by M. Inda Castle.  Called back, got no answer.  Left message asking for return call.

## 2013-10-31 NOTE — Telephone Encounter (Signed)
Daughter requesting Rx refill for divalproex (DEPAKOTE) 125 MG DR tablet.   Please forward to [employer].  Please call anytime to advise and if not available leave message on voice mail.  Thanks

## 2013-10-31 NOTE — Telephone Encounter (Signed)
The fax number here is [phone]

## 2013-10-31 NOTE — Telephone Encounter (Signed)
Daughter returning call, she stated Dr. Inda Castle refuses to write refill due to medication was prescribed by Dr. Leonie Man in hospital.  Please return call with additional questions or concerns.  Thanks

## 2013-11-01 DIAGNOSIS — I10 Essential (primary) hypertension: Secondary | ICD-10-CM | POA: Diagnosis not present

## 2013-11-01 DIAGNOSIS — Z5189 Encounter for other specified aftercare: Secondary | ICD-10-CM | POA: Diagnosis not present

## 2013-11-01 DIAGNOSIS — I69959 Hemiplegia and hemiparesis following unspecified cerebrovascular disease affecting unspecified side: Secondary | ICD-10-CM | POA: Diagnosis not present

## 2013-11-01 DIAGNOSIS — I4891 Unspecified atrial fibrillation: Secondary | ICD-10-CM | POA: Diagnosis not present

## 2013-11-01 DIAGNOSIS — F0390 Unspecified dementia without behavioral disturbance: Secondary | ICD-10-CM | POA: Diagnosis not present

## 2013-11-01 DIAGNOSIS — G43909 Migraine, unspecified, not intractable, without status migrainosus: Secondary | ICD-10-CM | POA: Diagnosis not present

## 2013-11-01 MED ORDER — DIVALPROEX SODIUM 125 MG PO DR TAB: 375.0000 mg | DELAYED_RELEASE_TABLET | Freq: Two times a day (BID) | ORAL | Status: DC

## 2013-11-01 NOTE — Telephone Encounter (Signed)
Ok to do so

## 2013-11-05 ENCOUNTER — Ambulatory Visit: Payer: Medicare Other | Admitting: Family

## 2013-11-06 DIAGNOSIS — N200 Calculus of kidney: Secondary | ICD-10-CM | POA: Diagnosis not present

## 2013-11-06 DIAGNOSIS — N952 Postmenopausal atrophic vaginitis: Secondary | ICD-10-CM | POA: Diagnosis not present

## 2013-11-06 DIAGNOSIS — N3020 Other chronic cystitis without hematuria: Secondary | ICD-10-CM | POA: Diagnosis not present

## 2013-11-15 ENCOUNTER — Ambulatory Visit (INDEPENDENT_AMBULATORY_CARE_PROVIDER_SITE_OTHER): Payer: Medicare Other | Admitting: Neurology

## 2013-11-15 ENCOUNTER — Encounter: Payer: Self-pay | Admitting: Neurology

## 2013-11-15 VITALS — BP 131/74 | HR 70 | Ht 67.0 in | Wt 118.4 lb

## 2013-11-15 DIAGNOSIS — I6350 Cerebral infarction due to unspecified occlusion or stenosis of unspecified cerebral artery: Secondary | ICD-10-CM

## 2013-11-15 NOTE — Progress Notes (Signed)
[facility] [address] [place]. [place] [address] [phone]       OFFICE FOLLOW-UP NOTE  [name] Date of Birth:  06-Apr-1924 Medical Record Number:  [id number]   HPI: 82 year Caucasian lady seen for first office follow for the following hospital admission on 03/09/13 for left hemiparesis. She has remote history of right brain subcortical infarct in May 2014 felt to be secondary to atrial fibrillation has been on anticoagulation with cerebral since then. She was admitted multiple times with worsening of hemiparesis in the setting of infection or dehydration. She had an MRI in December 2014 which did not reveal any new right brain stroke but showed and he of restricted diffusion which is patchy in the left parietal region. At that time it was not clear whether this was a stroke or not however the patient's daughter was informed today that she had indeed had a fall a month prior in November 2014 that she had sustained scalp hematoma in the left temporal region and had been seen in the ER and a solitary CT scan of the head was unremarkable and she was sent back to the nursing home. In retrospect the MRI diffusion abnormality in the left parietal region in December 2014 they represent a delayed hemorrhage contusion which may have sustained from a fall in November 2014. It was planned to change as there are 2 to a liquids but the family never made the switch as they were unclear as to what the co-pay for a liquids would cost him. The patient seems to be tolerating that well without any further increased bleeding or bruising. She suffered from a bad cold for 2 weeks and had a minor nasal bleed which stopped. She is currently living at home and getting physical and occupational therapy. She is able to walk with a walker fairly well. She is carefully washed over by 3 daughters. She has had some mild memory difficulties but these are not progressive. Update 11/15/2013 : She returns  for followup of her last visit in March 2015. She did benefit from outpatient physical and occupational therapy but was readmitted in May and was found to have a right corpus callosum infarct as well as bladder infection. I have personally reviewed imaging studies and hospital workup. She was on Xarelto which was switched to eliquis 2.5mg  twice daily. She had a prolonged stay in rehabilitation at [facility] and has just finished home physical and occupational therapy recently. She can walk with a walker but does lean forward sent to the right and requires one-person assist. She still has some residual left-sided weakness from her recent stroke. She is impulsive and does not always call for help. The family feels she is unsafe to walk by herself. The patient is quite frustrated about the situation. ROS:   14 system review of systems is positive for constipation, frequency or urination, memory loss, weakness, indication and confusion and all other systems negative  PMH:  Past Medical History  Diagnosis Date  . Arthritis   . Hypertension   . Migraine   . Colon polyp   . Hearing difficulty   . Torus palatinus   . Atrial flutter January, 2012  . Stroke 08/02/12     right lenticular nucleus infarct  . Left leg weakness 02/05/2013  . Chronic anticoagulation   . High cholesterol   . Atrial fibrillation     Social History:  History   Social History  . Marital Status: Widowed  Spouse Name: N/A    Number of Children: 3  . Years of Education: college   Occupational History  . editorial work for [employer]    Social History Main Topics  . Smoking status: Former Research scientist (life sciences)  . Smokeless tobacco: Never Used  . Alcohol Use: 0.0 oz/week     Comment: 08/01/2012 "glass of wine on special occasions"  . Drug Use: No  . Sexual Activity: No   Other Topics Concern  . Not on file   Social History Narrative   Patient lives at home with daughters.   Caffeine use: 1/2-1 cup of coffee occasionally     Medications:   Current Outpatient Prescriptions on File Prior to Visit  Medication Sig Dispense Refill  . acetaminophen (TYLENOL) 500 MG tablet Take 1,000 mg by mouth daily as needed for mild pain or headache.      Marland Kitchen apixaban (ELIQUIS) 2.5 MG TABS tablet Take 1 tablet (2.5 mg total) by mouth 2 (two) times daily.  60 tablet  0  . aspirin-acetaminophen-caffeine (EXCEDRIN MIGRAINE) 329-518-84 MG per tablet Take 2 tablets by mouth daily as needed for pain (for migraines).      Marland Kitchen b complex vitamins tablet Take 1 tablet by mouth 2 (two) times daily.       . Calcium Carb-Cholecalciferol (CALCIUM 600 + D) 600-200 MG-UNIT TABS Take 1 tablet by mouth 2 (two) times daily.      . Cholecalciferol (VITAMIN D) 2000 UNITS tablet Take 2,000 Units by mouth daily.      . divalproex (DEPAKOTE) 125 MG DR tablet Take 3 tablets (375 mg total) by mouth 2 (two) times daily.  180 tablet  3  . Multiple Vitamin (MULTIVITAMIN WITH MINERALS) TABS Take 1 tablet by mouth daily.       No current facility-administered medications on file prior to visit.    Allergies:   Allergies  Allergen Reactions  . Tramadol Hcl Other (See Comments)     lethargy, nausea  . Alendronate Sodium Rash and Other (See Comments)    puffy eyes    Physical Exam General: Frail elderly Caucasian lady seated, in no evident distress Head: head normocephalic and atraumatic. Orohparynx benign Neck: supple with no carotid or supraclavicular bruits Cardiovascular: regular rate and rhythm, no murmurs Musculoskeletal: no deformity Skin:  no rash/petichiae Vascular:  Normal pulses all extremities Filed Vitals:   11/15/13 0917  BP: 131/74  Pulse: 70   Neurologic Exam Mental Status: Awake and fully alert. Oriented to place and time. Recent and remote memory intact. Attention span, concentration and fund of knowledge slightly diminished. Mood and affect appropriate.  Cranial Nerves: Fundoscopic exam not done . Pupils equal, briskly reactive to  light. Extraocular movements full without nystagmus. Visual fields full to confrontation. Hearing diminished slightly bilaterally.. Facial sensation intact. Face, tongue, palate moves normally and symmetrically.  Motor: Normal bulk and tone. Normal strength in all tested extremity muscles except mild weakness of left grip and intrinsic hand muscles. Mild left hemiparesis 4/5. Mild drift left upper and lower extremity Diminished fine finger movements on the left. Orbits right over left upper extremity. Sensory.: intact to touch and pinprick and vibratory sensation.  Coordination: Rapid alternating movements normal in all extremities. Finger-to-nose and heel-to-shin performed accurately bilaterally. Gait and Station: Arises from chair with mild difficulty. Stance is stooped with mild kyphosis. And leans to the right Cautious unsteady gait   Unable to heel, toe and tandem walk without difficulty.  Reflexes: 1+ and symmetric. Toes downgoing.  NIHSS 3 Modified Rankin  3  ASSESSMENT: 54 year with remote history of right hemispheric subcortical infarct in May 2014 with recent admission in  May 2015 with new right corpus callosal embolic infarct from atrial fibrillation. She has residual mild left hemiparesis and poor balance and gait difficulties.  PLAN:  I had a long discussion with the patient and her 2 daughters regarding her fall risk and fall prevention precautions. Continue eliquis for secondary stroke prevention and use walker and one person assist at all times while walking. Return for followup in 6 months or earlier if necessary     Note: This document was prepared with digital dictation and possible smart phrase technology. Any transcriptional errors that result from this process are unintentional

## 2013-11-15 NOTE — Patient Instructions (Signed)
I had a long discussion with the patient and her 2 daughters regarding her fall risk and fall prevention precautions. Continue eliquis for secondary stroke prevention and use walker and one person assist at all times while walking. Return for followup in 6 months or earlier if necessary  Fall Prevention and [place] cause injuries and can affect all age groups. It is possible to use preventive measures to significantly decrease the likelihood of falls. There are many simple measures which can make your home safer and prevent falls. OUTDOORS  Repair cracks and edges of walkways and driveways.  Remove high doorway thresholds.  Trim shrubbery on the main path into your home.  Have good outside lighting.  Clear walkways of tools, rocks, debris, and clutter.  Check that handrails are not broken and are securely fastened. Both sides of steps should have handrails.  Have leaves, snow, and ice cleared regularly.  Use sand or salt on walkways during winter months.  In the garage, clean up grease or oil spills. BATHROOM  Install night lights.  Install grab bars by the toilet and in the tub and shower.  Use non-skid mats or decals in the tub or shower.  Place a plastic non-slip stool in the shower to sit on, if needed.  Keep floors dry and clean up all water on the floor immediately.  Remove soap buildup in the tub or shower on a regular basis.  Secure bath mats with non-slip, double-sided rug tape.  Remove throw rugs and tripping hazards from the floors. BEDROOMS  Install night lights.  Make sure a bedside light is easy to reach.  Do not use oversized bedding.  Keep a telephone by your bedside.  Have a firm chair with side arms to use for getting dressed.  Remove throw rugs and tripping hazards from the floor. KITCHEN  Keep handles on pots and pans turned toward the center of the stove. Use back burners when possible.  Clean up spills quickly and allow time for  drying.  Avoid walking on wet floors.  Avoid hot utensils and knives.  Position shelves so they are not too high or low.  Place commonly used objects within easy reach.  If necessary, use a sturdy step stool with a grab bar when reaching.  Keep electrical cables out of the way.  Do not use floor polish or wax that makes floors slippery. If you must use wax, use non-skid floor wax.  Remove throw rugs and tripping hazards from the floor. STAIRWAYS  Never leave objects on stairs.  Place handrails on both sides of stairways and use them. Fix any loose handrails. Make sure handrails on both sides of the stairways are as long as the stairs.  Check carpeting to make sure it is firmly attached along stairs. Make repairs to worn or loose carpet promptly.  Avoid placing throw rugs at the top or bottom of stairways, or properly secure the rug with carpet tape to prevent slippage. Get rid of throw rugs, if possible.  Have an electrician put in a light switch at the top and bottom of the stairs. OTHER FALL PREVENTION TIPS  Wear low-heel or rubber-soled shoes that are supportive and fit well. Wear closed toe shoes.  When using a stepladder, make sure it is fully opened and both spreaders are firmly locked. Do not climb a closed stepladder.  Add color or contrast paint or tape to grab bars and handrails in your home. Place contrasting color strips on first and  last steps.  Learn and use mobility aids as needed. Install an electrical emergency response system.  Turn on lights to avoid dark areas. Replace light bulbs that burn out immediately. Get light switches that glow.  Arrange furniture to create clear pathways. Keep furniture in the same place.  Firmly attach carpet with non-skid or double-sided tape.  Eliminate uneven floor surfaces.  Select a carpet pattern that does not visually hide the edge of steps.  Be aware of all pets. OTHER HOME SAFETY TIPS  Set the water temperature  for 120 F (48.8 C).  Keep emergency numbers on or near the telephone.  Keep smoke detectors on every level of the home and near sleeping areas. Document Released: 03/05/2002 Document Revised: 09/14/2011 Document Reviewed: 06/04/2011 [facility] Patient Information 2015 [place], [place]. This information is not intended to replace advice given to you by your health care provider. Make sure you discuss any questions you have with your health care provider.

## 2013-11-20 ENCOUNTER — Other Ambulatory Visit: Payer: Self-pay

## 2013-11-20 MED ORDER — APIXABAN 2.5 MG PO TABS: 2.5000 mg | ORAL_TABLET | Freq: Two times a day (BID) | ORAL | Status: DC

## 2013-11-22 DIAGNOSIS — N3020 Other chronic cystitis without hematuria: Secondary | ICD-10-CM | POA: Diagnosis not present

## 2013-11-22 DIAGNOSIS — N390 Urinary tract infection, site not specified: Secondary | ICD-10-CM | POA: Diagnosis not present

## 2013-11-22 DIAGNOSIS — N952 Postmenopausal atrophic vaginitis: Secondary | ICD-10-CM | POA: Diagnosis not present

## 2013-11-22 DIAGNOSIS — N200 Calculus of kidney: Secondary | ICD-10-CM | POA: Diagnosis not present

## 2013-11-23 ENCOUNTER — Encounter: Payer: Self-pay | Admitting: Family

## 2013-11-24 NOTE — Telephone Encounter (Signed)
Please see mychart message re: prolia.

## 2013-11-26 NOTE — Telephone Encounter (Signed)
Spoke with pt's daughter, [name] and scheduled nurse visit for 11/27/13 at 1:15pm. Pt wants to do Prolia and flu shot.

## 2013-11-27 ENCOUNTER — Ambulatory Visit (INDEPENDENT_AMBULATORY_CARE_PROVIDER_SITE_OTHER): Payer: Medicare Other | Admitting: General Practice

## 2013-11-27 DIAGNOSIS — M949 Disorder of cartilage, unspecified: Principal | ICD-10-CM

## 2013-11-27 DIAGNOSIS — Z23 Encounter for immunization: Secondary | ICD-10-CM | POA: Diagnosis not present

## 2013-11-27 DIAGNOSIS — M899 Disorder of bone, unspecified: Secondary | ICD-10-CM | POA: Diagnosis not present

## 2013-11-27 MED ORDER — DENOSUMAB 60 MG/ML ~~LOC~~ SOLN
60.0000 mg | Freq: Once | SUBCUTANEOUS | Status: AC
  Administered 2013-11-27: 60 mg via SUBCUTANEOUS

## 2013-12-08 ENCOUNTER — Telehealth: Payer: Self-pay | Admitting: Family

## 2013-12-08 NOTE — Telephone Encounter (Signed)
She will be establishing with Dr. Etter Sjogren on 9/25 but wanted to know status of Prolia? Would you please let pt/family know? thanks

## 2013-12-10 NOTE — Telephone Encounter (Signed)
Pt received Prolia on 11/27/13.

## 2013-12-11 ENCOUNTER — Ambulatory Visit: Payer: Medicare Other | Admitting: Family

## 2013-12-21 ENCOUNTER — Ambulatory Visit (INDEPENDENT_AMBULATORY_CARE_PROVIDER_SITE_OTHER): Payer: Medicare Other | Admitting: Family Medicine

## 2013-12-21 ENCOUNTER — Encounter: Payer: Self-pay | Admitting: Family Medicine

## 2013-12-21 VITALS — BP 126/74 | HR 79 | Wt 121.0 lb

## 2013-12-21 DIAGNOSIS — Z23 Encounter for immunization: Secondary | ICD-10-CM

## 2013-12-21 DIAGNOSIS — I4891 Unspecified atrial fibrillation: Secondary | ICD-10-CM

## 2013-12-21 DIAGNOSIS — G459 Transient cerebral ischemic attack, unspecified: Secondary | ICD-10-CM | POA: Diagnosis not present

## 2013-12-21 DIAGNOSIS — K5900 Constipation, unspecified: Secondary | ICD-10-CM | POA: Diagnosis not present

## 2013-12-21 DIAGNOSIS — I6350 Cerebral infarction due to unspecified occlusion or stenosis of unspecified cerebral artery: Secondary | ICD-10-CM | POA: Diagnosis not present

## 2013-12-21 MED ORDER — ZOSTER VACCINE LIVE 19400 UNT/0.65ML ~~LOC~~ SOLR: 0.6500 mL | Freq: Once | SUBCUTANEOUS | Status: DC

## 2013-12-21 NOTE — Patient Instructions (Signed)
Transient Ischemic Attack  A transient ischemic attack (TIA) is a "warning stroke" that causes stroke-like symptoms. Unlike a stroke, a TIA does not cause permanent damage to the brain. The symptoms of a TIA can happen very fast and do not last long. It is important to know the symptoms of a TIA and what to do. This can help prevent a major stroke or death.  CAUSES   · A TIA is caused by a temporary blockage in an artery in the brain or neck (carotid artery). The blockage does not allow the brain to get the blood supply it needs and can cause different symptoms. The blockage can be caused by either:  ¨ A blood clot.  ¨ Fatty buildup (plaque) in a neck or brain artery.  RISK FACTORS  · High blood pressure (hypertension).  · High cholesterol.  · Diabetes mellitus.  · Heart disease.  · The build up of plaque in the blood vessels (peripheral artery disease or atherosclerosis).  · The build up of plaque in the blood vessels providing blood and oxygen to the brain (carotid artery stenosis).  · An abnormal heart rhythm (atrial fibrillation).  · Obesity.  · Smoking.  · Taking oral contraceptives (especially in combination with smoking).  · Physical inactivity.  · A diet high in fats, salt (sodium), and calories.  · Alcohol use.  · Use of illegal drugs (especially cocaine and methamphetamine).  · Being female.  · Being African American.  · Being over the age of 55.  · Family history of stroke.  · Previous history of blood clots, stroke, TIA, or heart attack.  · Sickle cell disease.  SYMPTOMS   TIA symptoms are the same as a stroke but are temporary. These symptoms usually develop suddenly, or may be newly present upon awakening from sleep:  · Sudden weakness or numbness of the face, arm, or leg, especially on one side of the body.  · Sudden trouble walking or difficulty moving arms or legs.  · Sudden confusion.  · Sudden personality changes.  · Trouble speaking (aphasia) or understanding.  · Difficulty swallowing.  · Sudden  trouble seeing in one or both eyes.  · Double vision.  · Dizziness.  · Loss of balance or coordination.  · Sudden severe headache with no known cause.  · Trouble reading or writing.  · Loss of bowel or bladder control.  · Loss of consciousness.  DIAGNOSIS   Your caregiver may be able to determine the presence or absence of a TIA based on your symptoms, history, and physical exam. Computed tomography (CT scan) of the brain is usually performed to help identify a TIA. Other tests may be done to diagnose a TIA. These tests may include:  · Electrocardiography.  · Continuous heart monitoring.  · Echocardiography.  · Carotid ultrasonography.  · Magnetic resonance imaging (MRI).  · A scan of the brain circulation.  · Blood tests.  PREVENTION   The risk of a TIA can be decreased by appropriately treating high blood pressure, high cholesterol, diabetes, heart disease, and obesity and by quitting smoking, limiting alcohol, and staying physically active.  TREATMENT   Time is of the essence. Since the symptoms of TIA are the same as a stroke, it is important to seek treatment as soon as possible because you may need a medicine to dissolve the clot (thrombolytic) that cannot be given if too much time has passed. Treatment options vary. Treatment options may include rest, oxygen, intravenous (IV) fluids,   and medicines to thin the blood (anticoagulants). Medicines and diet may be used to address diabetes, high blood pressure, and other risk factors. Measures will be taken to prevent short-term and long-term complications, including infection from breathing foreign material into the lungs (aspiration pneumonia), blood clots in the legs, and falls. Treatment options include procedures to either remove plaque in the carotid arteries or dilate carotid arteries that have narrowed due to plaque. Those procedures are:  · Carotid endarterectomy.  · Carotid angioplasty and stenting.  HOME CARE INSTRUCTIONS   · Take all medicines prescribed  by your caregiver. Follow the directions carefully. Medicines may be used to control risk factors for a stroke. Be sure you understand all your medicine instructions.  · You may be told to take aspirin or the anticoagulant warfarin. Warfarin needs to be taken exactly as instructed.  ¨ Taking too much or too little warfarin is dangerous. Too much warfarin increases the risk of bleeding. Too little warfarin continues to allow the risk for blood clots. While taking warfarin, you will need to have regular blood tests to measure your blood clotting time. A PT blood test measures how long it takes for blood to clot. Your PT is used to calculate another value called an INR. Your PT and INR help your caregiver to adjust your dose of warfarin. The dose can change for many reasons. It is critically important that you take warfarin exactly as prescribed.  ¨ Many foods, especially foods high in vitamin K can interfere with warfarin and affect the PT and INR. Foods high in vitamin K include spinach, kale, broccoli, cabbage, collard and turnip greens, brussels sprouts, peas, cauliflower, seaweed, and parsley as well as beef and pork liver, green tea, and soybean oil. You should eat a consistent amount of foods high in vitamin K. Avoid major changes in your diet, or notify your caregiver before changing your diet. Arrange a visit with a dietitian to answer your questions.  ¨ Many medicines can interfere with warfarin and affect the PT and INR. You must tell your caregiver about any and all medicines you take, this includes all vitamins and supplements. Be especially cautious with aspirin and anti-inflammatory medicines. Do not take or discontinue any prescribed or over-the-counter medicine except on the advice of your caregiver or pharmacist.  ¨ Warfarin can have side effects, such as excessive bruising or bleeding. You will need to hold pressure over cuts for longer than usual. Your caregiver or pharmacist will discuss other  potential side effects.  ¨ Avoid sports or activities that may cause injury or bleeding.  ¨ Be mindful when shaving, flossing your teeth, or handling sharp objects.  ¨ Alcohol can change the body's ability to handle warfarin. It is best to avoid alcoholic drinks or consume only very small amounts while taking warfarin. Notify your caregiver if you change your alcohol intake.  ¨ Notify your dentist or other caregivers before procedures.  · Eat a diet that includes 5 or more servings of fruits and vegetables each day. This may reduce the risk of stroke. Certain diets may be prescribed to address high blood pressure, high cholesterol, diabetes, or obesity.  ¨ A low-sodium, low-saturated fat, low-trans fat, low-cholesterol diet is recommended to manage high blood pressure.  ¨ A low-saturated fat, low-trans fat, low-cholesterol, and high-fiber diet may control cholesterol levels.  ¨ A controlled-carbohydrate, controlled-sugar diet is recommended to manage diabetes.  ¨ A reduced-calorie, low-sodium, low-saturated fat, low-trans fat, low-cholesterol diet is recommended to manage obesity.  ·   Maintain a healthy weight.  · Stay physically active. It is recommended that you get at least 30 minutes of activity on most or all days.  · Do not smoke.  · Limit alcohol use even if you are not taking warfarin. Moderate alcohol use is considered to be:  ¨ No more than 2 drinks each day for men.  ¨ No more than 1 drink each day for nonpregnant women.  · Stop drug abuse.  · Home safety. A safe home environment is important to reduce the risk of falls. Your caregiver may arrange for specialists to evaluate your home. Having grab bars in the bedroom and bathroom is often important. Your caregiver may arrange for equipment to be used at home, such as raised toilets and a seat for the shower.  · Follow all instructions for follow-up with your caregiver. This is very important. This includes any referrals and lab tests. Proper follow up can  prevent a stroke or another TIA from occurring.  SEEK MEDICAL CARE IF:  · You have personality changes.  · You have difficulty swallowing.  · You are seeing double.  · You have dizziness.  · You have a fever.  · You have skin breakdown.  SEEK IMMEDIATE MEDICAL CARE IF:   Any of these symptoms may represent a serious problem that is an emergency. Do not wait to see if the symptoms will go away. Get medical help right away. Call your local emergency services (911 in [place] Do not drive yourself to the hospital.  · You have sudden weakness or numbness of the face, arm, or leg, especially on one side of the body.  · You have sudden trouble walking or difficulty moving arms or legs.  · You have sudden confusion.  · You have trouble speaking (aphasia) or understanding.  · You have sudden trouble seeing in one or both eyes.  · You have a loss of balance or coordination.  · You have a sudden, severe headache with no known cause.  · You have new chest pain or an irregular heartbeat.  · You have a partial or total loss of consciousness.  MAKE SURE YOU:   · Understand these instructions.  · Will watch your condition.  · Will get help right away if you are not doing well or get worse.  Document Released: 12/23/2004 Document Revised: 03/20/2013 Document Reviewed: 06/20/2013  [employer]® Patient Information ©2015 [employer]. This information is not intended to replace advice given to you by your health care provider. Make sure you discuss any questions you have with your health care provider.

## 2013-12-21 NOTE — Progress Notes (Signed)
Subjective:    Patient ID: [name], female    DOB: 1924-06-07, 80 y.o.   MRN: [id number]  HPI Pt is here to c/o constipation.  Pt has been taking colace and drinking water and taking prunes.  She is seeing neuro for her TIA and cardiolog for a fib.  Pt is very alert and answers questions appropriately except for the year--- she said it was 12.     Review of Systems As above     Past Medical History  Diagnosis Date  . Arthritis   . Hypertension   . Migraine   . Colon polyp   . Hearing difficulty   . Torus palatinus   . Atrial flutter January, 2012  . Stroke 08/02/12     right lenticular nucleus infarct  . Left leg weakness 02/05/2013  . Chronic anticoagulation   . High cholesterol   . Atrial fibrillation    History   Social History  . Marital Status: Widowed    Spouse Name: N/A    Number of Children: 3  . Years of Education: college   Occupational History  . editorial work for [employer]    Social History Main Topics  . Smoking status: Former Research scientist (life sciences)  . Smokeless tobacco: Never Used  . Alcohol Use: 0.0 oz/week     Comment: 08/01/2012 "glass of wine on special occasions"  . Drug Use: No  . Sexual Activity: No   Other Topics Concern  . Not on file   Social History Narrative   Patient lives at home with daughters.   Caffeine use: 1/2-1 cup of coffee occasionally   Current Outpatient Prescriptions  Medication Sig Dispense Refill  . acetaminophen (TYLENOL) 500 MG tablet Take 1,000 mg by mouth daily as needed for mild pain or headache.      Marland Kitchen apixaban (ELIQUIS) 2.5 MG TABS tablet Take 1 tablet (2.5 mg total) by mouth 2 (two) times daily.  60 tablet  6  . aspirin-acetaminophen-caffeine (EXCEDRIN MIGRAINE) 254-270-62 MG per tablet Take 2 tablets by mouth daily as needed for pain (for migraines).      Marland Kitchen b complex vitamins tablet Take 1 tablet by mouth 2 (two) times daily.       . Calcium Carb-Cholecalciferol (CALCIUM 600 + D) 600-200 MG-UNIT TABS Take 1 tablet  by mouth 2 (two) times daily.      . Cholecalciferol (VITAMIN D) 2000 UNITS tablet Take 2,000 Units by mouth daily.      . divalproex (DEPAKOTE) 125 MG DR tablet Take 3 tablets (375 mg total) by mouth 2 (two) times daily.  180 tablet  3  . Docusate Sodium (COLACE PO) Take by mouth daily.      . Multiple Vitamin (MULTIVITAMIN WITH MINERALS) TABS Take 1 tablet by mouth daily.      Marland Kitchen zoster vaccine live, PF, (ZOSTAVAX) 37628 UNT/0.65ML injection Inject 19,400 Units into the skin once.  1 each  0   No current facility-administered medications for this visit.   Family History  Problem Relation Age of Onset  . Colon cancer    . Breast cancer    . Brain cancer    . Cancer Mother     breast  . Aneurysm Mother     brain  . Heart attack Neg Hx   . Diabetes Neg Hx   . Hypertension Neg Hx    Objective:   Physical Exam BP 126/74  Pulse 79  Wt 121 lb (54.885 kg)  SpO2 97%  General appearance: alert, cooperative, appears stated age and no distress Throat: lips, mucosa, and tongue normal; teeth and gums normal Neck: no adenopathy, no carotid bruit, no JVD, supple, symmetrical, trachea midline and thyroid not enlarged, symmetric, no tenderness/mass/nodules Lungs: clear to auscultation bilaterally Heart: irregularly irregular rhythm Extremities: extremities normal, atraumatic, no cyanosis or edema Neurologic: Mental status: alertness: alert, orientation: person, place, city        Assessment & Plan:  1. Need for shingles vaccine  - zoster vaccine live, PF, (ZOSTAVAX) 96045 UNT/0.65ML injection; Inject 19,400 Units into the skin once.  Dispense: 1 each; Refill: 0  2. Transient cerebral ischemia, unspecified transient cerebral ischemia type Per neuro  3. Unspecified constipation Water, miralax, con't prunes and colace  4. Atrial fibrillation, unspecified Per cardiology  5osteoporosis---- on prolia

## 2013-12-21 NOTE — Assessment & Plan Note (Signed)
Per cardiology 

## 2013-12-21 NOTE — Progress Notes (Signed)
Pre visit review using our clinic review tool, if applicable. No additional management support is needed unless otherwise documented below in the visit note. 

## 2013-12-26 DIAGNOSIS — I4892 Unspecified atrial flutter: Secondary | ICD-10-CM | POA: Diagnosis not present

## 2013-12-26 DIAGNOSIS — Z8673 Personal history of transient ischemic attack (TIA), and cerebral infarction without residual deficits: Secondary | ICD-10-CM | POA: Diagnosis not present

## 2013-12-26 DIAGNOSIS — I951 Orthostatic hypotension: Secondary | ICD-10-CM | POA: Diagnosis not present

## 2014-02-25 ENCOUNTER — Other Ambulatory Visit: Payer: Self-pay | Admitting: Neurology

## 2014-02-25 MED ORDER — APIXABAN 2.5 MG PO TABS: 2.5000 mg | ORAL_TABLET | Freq: Two times a day (BID) | ORAL | Status: DC

## 2014-02-25 NOTE — Telephone Encounter (Signed)
Patient requests 90 day Rx  

## 2014-06-21 ENCOUNTER — Ambulatory Visit: Payer: Medicare Other | Admitting: Family

## 2014-06-24 ENCOUNTER — Encounter: Payer: Self-pay | Admitting: Family Medicine

## 2014-06-24 ENCOUNTER — Ambulatory Visit (INDEPENDENT_AMBULATORY_CARE_PROVIDER_SITE_OTHER): Payer: Medicare Other | Admitting: Family Medicine

## 2014-06-24 VITALS — BP 134/76 | HR 93 | Temp 97.5°F | Wt 132.0 lb

## 2014-06-24 DIAGNOSIS — R8290 Unspecified abnormal findings in urine: Secondary | ICD-10-CM

## 2014-06-24 DIAGNOSIS — M6289 Other specified disorders of muscle: Secondary | ICD-10-CM | POA: Diagnosis not present

## 2014-06-24 DIAGNOSIS — Z8673 Personal history of transient ischemic attack (TIA), and cerebral infarction without residual deficits: Secondary | ICD-10-CM | POA: Diagnosis not present

## 2014-06-24 DIAGNOSIS — I4891 Unspecified atrial fibrillation: Secondary | ICD-10-CM | POA: Diagnosis not present

## 2014-06-24 DIAGNOSIS — N390 Urinary tract infection, site not specified: Secondary | ICD-10-CM

## 2014-06-24 DIAGNOSIS — E785 Hyperlipidemia, unspecified: Secondary | ICD-10-CM

## 2014-06-24 DIAGNOSIS — R82998 Other abnormal findings in urine: Secondary | ICD-10-CM

## 2014-06-24 DIAGNOSIS — R531 Weakness: Secondary | ICD-10-CM

## 2014-06-24 DIAGNOSIS — R8299 Other abnormal findings in urine: Secondary | ICD-10-CM | POA: Diagnosis not present

## 2014-06-24 DIAGNOSIS — K648 Other hemorrhoids: Secondary | ICD-10-CM

## 2014-06-24 LAB — CBC WITH DIFFERENTIAL/PLATELET
BASOS PCT: 0.4 % (ref 0.0–3.0)
Basophils Absolute: 0 10*3/uL (ref 0.0–0.1)
EOS PCT: 1.8 % (ref 0.0–5.0)
Eosinophils Absolute: 0.1 10*3/uL (ref 0.0–0.7)
HCT: 42.7 % (ref 36.0–46.0)
Hemoglobin: 14.3 g/dL (ref 12.0–15.0)
LYMPHS ABS: 1.1 10*3/uL (ref 0.7–4.0)
Lymphocytes Relative: 21.8 % (ref 12.0–46.0)
MCHC: 33.5 g/dL (ref 30.0–36.0)
MCV: 93.9 fl (ref 78.0–100.0)
MONOS PCT: 14.5 % — AB (ref 3.0–12.0)
Monocytes Absolute: 0.7 10*3/uL (ref 0.1–1.0)
Neutro Abs: 3.2 10*3/uL (ref 1.4–7.7)
Neutrophils Relative %: 61.5 % (ref 43.0–77.0)
Platelets: 174 10*3/uL (ref 150.0–400.0)
RBC: 4.55 Mil/uL (ref 3.87–5.11)
RDW: 15.9 % — ABNORMAL HIGH (ref 11.5–15.5)
WBC: 5.2 10*3/uL (ref 4.0–10.5)

## 2014-06-24 LAB — BASIC METABOLIC PANEL
BUN: 19 mg/dL (ref 6–23)
CHLORIDE: 105 meq/L (ref 96–112)
CO2: 32 mEq/L (ref 19–32)
CREATININE: 0.91 mg/dL (ref 0.40–1.20)
Calcium: 9.9 mg/dL (ref 8.4–10.5)
GFR: 62.06 mL/min (ref >60.00)
Glucose, Bld: 98 mg/dL (ref 70–99)
Potassium: 4 mEq/L (ref 3.5–5.1)
Sodium: 141 mEq/L (ref 135–145)

## 2014-06-24 LAB — POCT URINALYSIS DIPSTICK
Bilirubin, UA: NEGATIVE
Blood, UA: NEGATIVE
Glucose, UA: NEGATIVE
Nitrite, UA: NEGATIVE
PROTEIN UA: NEGATIVE
Spec Grav, UA: >=1.03
Urobilinogen, UA: 0.2
pH, UA: 6

## 2014-06-24 LAB — LIPID PANEL
Cholesterol: 233 mg/dL — ABNORMAL HIGH (ref 0–200)
HDL: 81.7 mg/dL (ref >39.00)
LDL Cholesterol: 132 mg/dL — ABNORMAL HIGH (ref 0–99)
NONHDL: 151.3
Total CHOL/HDL Ratio: 3
Triglycerides: 96 mg/dL (ref 0.0–149.0)
VLDL: 19.2 mg/dL (ref 0.0–40.0)

## 2014-06-24 LAB — HEPATIC FUNCTION PANEL
ALBUMIN: 3.8 g/dL (ref 3.5–5.2)
ALK PHOS: 41 U/L (ref 39–117)
ALT: 12 U/L (ref 0–35)
AST: 21 U/L (ref 0–37)
Bilirubin, Direct: 0.1 mg/dL (ref 0.0–0.3)
TOTAL PROTEIN: 6.7 g/dL (ref 6.0–8.3)
Total Bilirubin: 0.5 mg/dL (ref 0.2–1.2)

## 2014-06-24 MED ORDER — HYDROCORTISONE ACETATE 25 MG RE SUPP: 25.0000 mg | Freq: Two times a day (BID) | RECTAL | Status: DC

## 2014-06-24 MED ORDER — HYDROCORTISONE 2.5 % RE CREA: 1.0000 "application " | TOPICAL_CREAM | Freq: Two times a day (BID) | RECTAL | Status: DC

## 2014-06-24 NOTE — Addendum Note (Signed)
Addended by: Harl Bowie on: 06/24/2014 02:16 PM   Modules accepted: Orders

## 2014-06-24 NOTE — Progress Notes (Signed)
Patient ID: [name], female    DOB: 1925-02-16  Age: 81 y.o. MRN: [id number]    Subjective:  Subjective HPI [name] presents for f/u.  Pt 3 daughters are present.  She has put on somei weight which they are pleased with but she is eating junk mostly so they have more recently been trying to get more protein in her diet.  Pt is also getting harder and harder to get up and out.  She is c/o back pain that is better with heat.  Family is requesting home health eval for PT for deconditioning and strengthening exercises.  They are also c/o hemorrhoids and she strains to go to bathroom.  They don't want a referral for banding but do want meds to try to shrink them.    Review of Systems  Constitutional: Positive for activity change and appetite change. Negative for fatigue and unexpected weight change.  Respiratory: Negative for cough and shortness of breath.   Cardiovascular: Negative for chest pain, palpitations and leg swelling.  Gastrointestinal: Negative for nausea, vomiting, abdominal pain, diarrhea, constipation and abdominal distention.  Musculoskeletal: Positive for back pain and arthralgias.  Psychiatric/Behavioral: Positive for confusion. Negative for behavioral problems, dysphoric mood and decreased concentration. The patient is nervous/anxious.     History Past Medical History  Diagnosis Date  . Arthritis   . Hypertension   . Migraine   . Colon polyp   . Hearing difficulty   . Torus palatinus   . Atrial flutter January, 2012  . Stroke 08/02/12     right lenticular nucleus infarct  . Left leg weakness 02/05/2013  . Chronic anticoagulation   . High cholesterol   . Atrial fibrillation     She has past surgical history that includes Cataract extraction w/ intraocular lens  implant, bilateral (2006-2008); Ganglion cyst excision (Bilateral, 1938,1954,2003,2005); Cardioversion (05/19/2010); Tonsillectomy (~ 1935); Appendectomy (02/19/53); and Mouth surgery.   Her family  history includes Aneurysm in her mother; Brain cancer in an other family member; Breast cancer in an other family member; Cancer in her mother; Colon cancer in an other family member. There is no history of Heart attack, Diabetes, or Hypertension.She reports that she has quit smoking. She has never used smokeless tobacco. She reports that she drinks alcohol. She reports that she does not use illicit drugs.  Current Outpatient Prescriptions on File Prior to Visit  Medication Sig Dispense Refill  . acetaminophen (TYLENOL) 500 MG tablet Take 1,000 mg by mouth daily as needed for mild pain or headache.    Marland Kitchen apixaban (ELIQUIS) 2.5 MG TABS tablet Take 1 tablet (2.5 mg total) by mouth 2 (two) times daily. 180 tablet 1  . b complex vitamins tablet Take 1 tablet by mouth 2 (two) times daily.     . Calcium Carb-Cholecalciferol (CALCIUM 600 + D) 600-200 MG-UNIT TABS Take 1 tablet by mouth 2 (two) times daily.    . Cholecalciferol (VITAMIN D) 2000 UNITS tablet Take 2,000 Units by mouth daily.    . divalproex (DEPAKOTE) 125 MG DR tablet TAKE 3 TABLETS (375 MG TOTAL) BY MOUTH 2 (TWO) TIMES DAILY. 540 tablet 1  . Docusate Sodium (COLACE PO) Take by mouth every other day.     . Multiple Vitamin (MULTIVITAMIN WITH MINERALS) TABS Take 1 tablet by mouth daily.    Marland Kitchen zoster vaccine live, PF, (ZOSTAVAX) 40981 UNT/0.65ML injection Inject 19,400 Units into the skin once. 1 each 0   No current facility-administered medications on file prior to  visit.     Objective:  Objective Physical Exam  Constitutional: She is oriented to person, place, and time. She appears well-developed and well-nourished. No distress.  HENT:  Right Ear: External ear normal.  Left Ear: External ear normal.  Nose: Nose normal.  Mouth/Throat: Oropharynx is clear and moist.  Eyes: EOM are normal. Pupils are equal, round, and reactive to light.  Neck: Normal range of motion. Neck supple.  Cardiovascular: An irregularly irregular rhythm present.   No murmur heard. Pulmonary/Chest: Effort normal and breath sounds normal. No respiratory distress. She has no wheezes. She has no rales. She exhibits no tenderness.  Genitourinary: Rectal exam shows external hemorrhoid.  Neurological: She is alert and oriented to person, place, and time.  Psychiatric: She has a normal mood and affect. Her behavior is normal.   BP 134/76 mmHg  Pulse 93  Temp(Src) 97.5 F (36.4 C) (Oral)  Wt 132 lb (59.875 kg)  SpO2 96% Wt Readings from Last 3 Encounters:  06/24/14 132 lb (59.875 kg)  12/21/13 121 lb (54.885 kg)  11/15/13 118 lb 6.4 oz (53.706 kg)     Lab Results  Component Value Date   WBC 6.0 10/10/2013   HGB 12.1 10/10/2013   HCT 37.4 10/10/2013   PLT 159 10/10/2013   GLUCOSE 153* 10/10/2013   CHOL 166 08/28/2013   TRIG 68 08/28/2013   HDL 68 08/28/2013   LDLCALC 84 08/28/2013   ALT 10 10/10/2013   AST 21 10/10/2013   NA 144 10/10/2013   K 4.3 10/10/2013   CL 104 10/10/2013   CREATININE 0.90 10/10/2013   BUN 17 10/10/2013   CO2 28 10/10/2013   TSH 1.038 10/05/2010   INR 1.72* 08/27/2013   HGBA1C 5.8* 08/28/2013    Dg Chest 2 View  10/10/2013   CLINICAL DATA:  Syncopal episode.  EXAM: CHEST  2 VIEW  COMPARISON:  09/17/2013.  FINDINGS: Mediastinum and hilar structures are normal. Cardiomegaly, normal pulmonary vascularity. No focal infiltrate. No pleural effusion or pneumothorax. Radiopacity noted over right upper lung is most likely secondary to overlying EKG lead. No acute osseous abnormality.  IMPRESSION: 1. Mild cardiomegaly.  No CHF. 2. No focal pulmonary abnormality.   Electronically Signed   By: Marcello Moores  Register   On: 10/10/2013 13:18     Assessment & Plan:  Plan I have discontinued Ms. Hine's aspirin-acetaminophen-caffeine. I am also having her start on hydrocortisone and hydrocortisone. Additionally, I am having her maintain her multivitamin with minerals, b complex vitamins, Vitamin D, Calcium Carb-Cholecalciferol,  acetaminophen, Docusate Sodium (COLACE PO), zoster vaccine live (PF), divalproex, apixaban, and SENNA CO.  Meds ordered this encounter  Medications  . SENNA CO    Sig: by Combination route every other day.  . hydrocortisone (ANUSOL-HC) 25 MG suppository    Sig: Place 1 suppository (25 mg total) rectally 2 (two) times daily.    Dispense:  12 suppository    Refill:  0  . hydrocortisone (ANUSOL-HC) 2.5 % rectal cream    Sig: Place 1 application rectally 2 (two) times daily.    Dispense:  30 g    Refill:  0    Problem List Items Addressed This Visit    Left-sided weakness - Primary   Relevant Orders   Ambulatory referral to [facility]   History of CVA (cerebrovascular accident)   Relevant Orders   Basic metabolic panel   CBC with Differential/Platelet   Hepatic function panel   Lipid panel   POCT urinalysis dipstick  Other Visit Diagnoses    Other hemorrhoids        Relevant Medications    hydrocortisone (ANUSOL-HC) 25 MG suppository    hydrocortisone (ANUSOL-HC) 2.5 % rectal cream    Atrial fibrillation, unspecified        Relevant Orders    Basic metabolic panel    CBC with Differential/Platelet    Hepatic function panel    Lipid panel    POCT urinalysis dipstick    Hyperlipemia        Relevant Orders    Hepatic function panel    Lipid panel       Follow-up: Return in about 6 months (around 12/25/2014), or if symptoms worsen or fail to improve, for f/u and labs.  [name], DO

## 2014-06-24 NOTE — Progress Notes (Signed)
Pre visit review using our clinic review tool, if applicable. No additional management support is needed unless otherwise documented below in the visit note. 

## 2014-06-24 NOTE — Patient Instructions (Signed)
Stroke Prevention Some medical conditions and behaviors are associated with an increased chance of having a stroke. You may prevent a stroke by making healthy choices and managing medical conditions. HOW CAN I REDUCE MY RISK OF HAVING A STROKE?   Stay physically active. Get at least 30 minutes of activity on most or all days.  Do not smoke. It may also be helpful to avoid exposure to secondhand smoke.  Limit alcohol use. Moderate alcohol use is considered to be:  No more than 2 drinks per day for men.  No more than 1 drink per day for nonpregnant women.  Eat healthy foods. This involves:  Eating 5 or more servings of fruits and vegetables a day.  Making dietary changes that address high blood pressure (hypertension), high cholesterol, diabetes, or obesity.  Manage your cholesterol levels.  Making food choices that are high in fiber and low in saturated fat, trans fat, and cholesterol may control cholesterol levels.  Take any prescribed medicines to control cholesterol as directed by your health care provider.  Manage your diabetes.  Controlling your carbohydrate and sugar intake is recommended to manage diabetes.  Take any prescribed medicines to control diabetes as directed by your health care provider.  Control your hypertension.  Making food choices that are low in salt (sodium), saturated fat, trans fat, and cholesterol is recommended to manage hypertension.  Take any prescribed medicines to control hypertension as directed by your health care provider.  Maintain a healthy weight.  Reducing calorie intake and making food choices that are low in sodium, saturated fat, trans fat, and cholesterol are recommended to manage weight.  Stop drug abuse.  Avoid taking birth control pills.  Talk to your health care provider about the risks of taking birth control pills if you are over 35 years old, smoke, get migraines, or have ever had a blood clot.  Get evaluated for sleep  disorders (sleep apnea).  Talk to your health care provider about getting a sleep evaluation if you snore a lot or have excessive sleepiness.  Take medicines only as directed by your health care provider.  For some people, aspirin or blood thinners (anticoagulants) are helpful in reducing the risk of forming abnormal blood clots that can lead to stroke. If you have the irregular heart rhythm of atrial fibrillation, you should be on a blood thinner unless there is a good reason you cannot take them.  Understand all your medicine instructions.  Make sure that other conditions (such as anemia or atherosclerosis) are addressed. SEEK IMMEDIATE MEDICAL CARE IF:   You have sudden weakness or numbness of the face, arm, or leg, especially on one side of the body.  Your face or eyelid droops to one side.  You have sudden confusion.  You have trouble speaking (aphasia) or understanding.  You have sudden trouble seeing in one or both eyes.  You have sudden trouble walking.  You have dizziness.  You have a loss of balance or coordination.  You have a sudden, severe headache with no known cause.  You have new chest pain or an irregular heartbeat. Any of these symptoms may represent a serious problem that is an emergency. Do not wait to see if the symptoms will go away. Get medical help at once. Call your local emergency services (911 in [place] Do not drive yourself to the hospital. Document Released: 04/22/2004 Document Revised: 07/30/2013 Document Reviewed: 09/15/2012 ExitCare Patient Information 2015 [employer]. This information is not intended to replace advice given   to you by your health care provider. Make sure you discuss any questions you have with your health care provider.  

## 2014-06-25 ENCOUNTER — Telehealth: Payer: Self-pay | Admitting: Family

## 2014-06-25 LAB — URINE CULTURE
Colony Count: NO GROWTH
Organism ID, Bacteria: NO GROWTH

## 2014-06-25 NOTE — Telephone Encounter (Signed)
It was faxed to [facility] on 06/24/14

## 2014-06-25 NOTE — Telephone Encounter (Signed)
Caller name:kim plummer Relation to ZO:[id number] Call back [place]:  Reason for call: pt saw dr. Etter Sjogren on yesterday, daughter states dr. Etter Sjogren ask them who did they want to use for home services and they informed her care [place], daughter states she [facility], [facility] not [facility], would like for dr. Etter Sjogren to correct. Daughter states they do not want [facility] in their home.

## 2014-06-26 ENCOUNTER — Telehealth: Payer: Self-pay | Admitting: Family

## 2014-06-26 DIAGNOSIS — M1990 Unspecified osteoarthritis, unspecified site: Secondary | ICD-10-CM | POA: Diagnosis not present

## 2014-06-26 DIAGNOSIS — I69354 Hemiplegia and hemiparesis following cerebral infarction affecting left non-dominant side: Secondary | ICD-10-CM | POA: Diagnosis not present

## 2014-06-26 DIAGNOSIS — I4891 Unspecified atrial fibrillation: Secondary | ICD-10-CM | POA: Diagnosis not present

## 2014-06-26 DIAGNOSIS — Z8781 Personal history of (healed) traumatic fracture: Secondary | ICD-10-CM | POA: Diagnosis not present

## 2014-06-26 DIAGNOSIS — Z9181 History of falling: Secondary | ICD-10-CM | POA: Diagnosis not present

## 2014-06-26 DIAGNOSIS — I10 Essential (primary) hypertension: Secondary | ICD-10-CM | POA: Diagnosis not present

## 2014-06-26 NOTE — Telephone Encounter (Signed)
Verbal auth given to [place]. She will see pt twice a week for 5 weeks.

## 2014-06-26 NOTE — Telephone Encounter (Signed)
Caller name:  [place] from [place] Relation to pt: Call back number: [phone] Pharmacy:  Reason for call:   Needs verbal order for home PT to start

## 2014-06-28 DIAGNOSIS — Z8781 Personal history of (healed) traumatic fracture: Secondary | ICD-10-CM | POA: Diagnosis not present

## 2014-06-28 DIAGNOSIS — Z9181 History of falling: Secondary | ICD-10-CM | POA: Diagnosis not present

## 2014-06-28 DIAGNOSIS — I69354 Hemiplegia and hemiparesis following cerebral infarction affecting left non-dominant side: Secondary | ICD-10-CM | POA: Diagnosis not present

## 2014-06-28 DIAGNOSIS — I4891 Unspecified atrial fibrillation: Secondary | ICD-10-CM | POA: Diagnosis not present

## 2014-06-28 DIAGNOSIS — I10 Essential (primary) hypertension: Secondary | ICD-10-CM | POA: Diagnosis not present

## 2014-06-28 DIAGNOSIS — M1990 Unspecified osteoarthritis, unspecified site: Secondary | ICD-10-CM | POA: Diagnosis not present

## 2014-07-02 DIAGNOSIS — Z9181 History of falling: Secondary | ICD-10-CM | POA: Diagnosis not present

## 2014-07-02 DIAGNOSIS — Z8781 Personal history of (healed) traumatic fracture: Secondary | ICD-10-CM | POA: Diagnosis not present

## 2014-07-02 DIAGNOSIS — I69354 Hemiplegia and hemiparesis following cerebral infarction affecting left non-dominant side: Secondary | ICD-10-CM | POA: Diagnosis not present

## 2014-07-02 DIAGNOSIS — I4891 Unspecified atrial fibrillation: Secondary | ICD-10-CM | POA: Diagnosis not present

## 2014-07-02 DIAGNOSIS — I10 Essential (primary) hypertension: Secondary | ICD-10-CM | POA: Diagnosis not present

## 2014-07-02 DIAGNOSIS — M1990 Unspecified osteoarthritis, unspecified site: Secondary | ICD-10-CM | POA: Diagnosis not present

## 2014-07-04 ENCOUNTER — Telehealth: Payer: Self-pay

## 2014-07-04 DIAGNOSIS — I69354 Hemiplegia and hemiparesis following cerebral infarction affecting left non-dominant side: Secondary | ICD-10-CM | POA: Diagnosis not present

## 2014-07-04 DIAGNOSIS — Z9181 History of falling: Secondary | ICD-10-CM | POA: Diagnosis not present

## 2014-07-04 DIAGNOSIS — I10 Essential (primary) hypertension: Secondary | ICD-10-CM | POA: Diagnosis not present

## 2014-07-04 DIAGNOSIS — M1990 Unspecified osteoarthritis, unspecified site: Secondary | ICD-10-CM | POA: Diagnosis not present

## 2014-07-04 DIAGNOSIS — I4891 Unspecified atrial fibrillation: Secondary | ICD-10-CM | POA: Diagnosis not present

## 2014-07-04 DIAGNOSIS — Z8781 Personal history of (healed) traumatic fracture: Secondary | ICD-10-CM | POA: Diagnosis not present

## 2014-07-04 NOTE — Telephone Encounter (Signed)
Received Home Health Certification and Plan of Care forms from [facility], forms given to Dr. Etter Sjogren for review and signing.

## 2014-07-10 ENCOUNTER — Telehealth: Payer: Self-pay | Admitting: Family

## 2014-07-10 DIAGNOSIS — M546 Pain in thoracic spine: Secondary | ICD-10-CM

## 2014-07-10 DIAGNOSIS — I10 Essential (primary) hypertension: Secondary | ICD-10-CM | POA: Diagnosis not present

## 2014-07-10 DIAGNOSIS — I4891 Unspecified atrial fibrillation: Secondary | ICD-10-CM | POA: Diagnosis not present

## 2014-07-10 DIAGNOSIS — M1990 Unspecified osteoarthritis, unspecified site: Secondary | ICD-10-CM | POA: Diagnosis not present

## 2014-07-10 DIAGNOSIS — Z8781 Personal history of (healed) traumatic fracture: Secondary | ICD-10-CM | POA: Diagnosis not present

## 2014-07-10 DIAGNOSIS — I69354 Hemiplegia and hemiparesis following cerebral infarction affecting left non-dominant side: Secondary | ICD-10-CM | POA: Diagnosis not present

## 2014-07-10 DIAGNOSIS — M542 Cervicalgia: Secondary | ICD-10-CM

## 2014-07-10 DIAGNOSIS — Z9181 History of falling: Secondary | ICD-10-CM | POA: Diagnosis not present

## 2014-07-10 NOTE — Telephone Encounter (Signed)
We would need to know what part of the back-- to make sure correct area is xrayed

## 2014-07-10 NOTE — Telephone Encounter (Signed)
Caller name: kim Relation to pt: daughter Call back number: [phone] or [phone] Pharmacy:  Reason for call:   [name] states that the physical therapist just left patient home and thought that it would be a good idea if patient has an Xray of her back regarding back pain. Informed Kim that Dr. Etter Sjogren is out of the office this week and would need to be seen. [name] wants to wait until Monday to see if Dr. Etter Sjogren could just order an xray without being seen.

## 2014-07-10 NOTE — Telephone Encounter (Signed)
To MD to review and advised, patient agree to waiting until Monday.     KP

## 2014-07-11 NOTE — Telephone Encounter (Signed)
Please enter: C-spine x-ray T-spine x-ray DDX cervical and thoracic spine pain. Okay to proceed with PT however if the patient expresses a lot of pain doing PT, that needs to be stopped and the patient is to be seen

## 2014-07-11 NOTE — Telephone Encounter (Signed)
[name] is at the home with the patient and is aware the orders have been placed and she will take her to have it done. Will follow up with the Physical therapist.       KP

## 2014-07-11 NOTE — Telephone Encounter (Signed)
When the patient sits up her whole back hurts, the Physical Therapist is the one who recommended the X-ray. The pain is worst in the upper back and cervical area. The Physical Therapist wants to know what's wrong before starting an exercise regimen. Please advise if it is ok to order an X-ray.      KP

## 2014-07-12 DIAGNOSIS — I69354 Hemiplegia and hemiparesis following cerebral infarction affecting left non-dominant side: Secondary | ICD-10-CM | POA: Diagnosis not present

## 2014-07-12 DIAGNOSIS — M1990 Unspecified osteoarthritis, unspecified site: Secondary | ICD-10-CM | POA: Diagnosis not present

## 2014-07-12 DIAGNOSIS — I4891 Unspecified atrial fibrillation: Secondary | ICD-10-CM | POA: Diagnosis not present

## 2014-07-12 DIAGNOSIS — Z8781 Personal history of (healed) traumatic fracture: Secondary | ICD-10-CM | POA: Diagnosis not present

## 2014-07-12 DIAGNOSIS — Z9181 History of falling: Secondary | ICD-10-CM | POA: Diagnosis not present

## 2014-07-12 DIAGNOSIS — I10 Essential (primary) hypertension: Secondary | ICD-10-CM | POA: Diagnosis not present

## 2014-07-15 ENCOUNTER — Ambulatory Visit (HOSPITAL_BASED_OUTPATIENT_CLINIC_OR_DEPARTMENT_OTHER)
Admission: RE | Admit: 2014-07-15 | Discharge: 2014-07-15 | Disposition: A | Discharge status: Home / Self Care | Payer: Medicare Other | Source: Ambulatory Visit | Attending: Internal Medicine | Admitting: Internal Medicine

## 2014-07-15 DIAGNOSIS — M546 Pain in thoracic spine: Secondary | ICD-10-CM

## 2014-07-15 DIAGNOSIS — M47892 Other spondylosis, cervical region: Secondary | ICD-10-CM | POA: Diagnosis not present

## 2014-07-15 DIAGNOSIS — M438X4 Other specified deforming dorsopathies, thoracic region: Secondary | ICD-10-CM | POA: Insufficient documentation

## 2014-07-15 DIAGNOSIS — M542 Cervicalgia: Secondary | ICD-10-CM | POA: Diagnosis not present

## 2014-07-15 DIAGNOSIS — M5032 Other cervical disc degeneration, mid-cervical region: Secondary | ICD-10-CM | POA: Diagnosis not present

## 2014-07-15 DIAGNOSIS — M5489 Other dorsalgia: Secondary | ICD-10-CM | POA: Insufficient documentation

## 2014-07-16 DIAGNOSIS — M1990 Unspecified osteoarthritis, unspecified site: Secondary | ICD-10-CM | POA: Diagnosis not present

## 2014-07-16 DIAGNOSIS — I4891 Unspecified atrial fibrillation: Secondary | ICD-10-CM | POA: Diagnosis not present

## 2014-07-16 DIAGNOSIS — I10 Essential (primary) hypertension: Secondary | ICD-10-CM | POA: Diagnosis not present

## 2014-07-16 DIAGNOSIS — Z9181 History of falling: Secondary | ICD-10-CM | POA: Diagnosis not present

## 2014-07-16 DIAGNOSIS — I69354 Hemiplegia and hemiparesis following cerebral infarction affecting left non-dominant side: Secondary | ICD-10-CM | POA: Diagnosis not present

## 2014-07-16 DIAGNOSIS — Z8781 Personal history of (healed) traumatic fracture: Secondary | ICD-10-CM | POA: Diagnosis not present

## 2014-07-18 DIAGNOSIS — Z9181 History of falling: Secondary | ICD-10-CM | POA: Diagnosis not present

## 2014-07-18 DIAGNOSIS — M1990 Unspecified osteoarthritis, unspecified site: Secondary | ICD-10-CM | POA: Diagnosis not present

## 2014-07-18 DIAGNOSIS — Z8781 Personal history of (healed) traumatic fracture: Secondary | ICD-10-CM | POA: Diagnosis not present

## 2014-07-18 DIAGNOSIS — I4891 Unspecified atrial fibrillation: Secondary | ICD-10-CM | POA: Diagnosis not present

## 2014-07-18 DIAGNOSIS — I10 Essential (primary) hypertension: Secondary | ICD-10-CM | POA: Diagnosis not present

## 2014-07-18 DIAGNOSIS — I69354 Hemiplegia and hemiparesis following cerebral infarction affecting left non-dominant side: Secondary | ICD-10-CM | POA: Diagnosis not present

## 2014-07-23 DIAGNOSIS — I10 Essential (primary) hypertension: Secondary | ICD-10-CM | POA: Diagnosis not present

## 2014-07-23 DIAGNOSIS — Z8781 Personal history of (healed) traumatic fracture: Secondary | ICD-10-CM | POA: Diagnosis not present

## 2014-07-23 DIAGNOSIS — I69354 Hemiplegia and hemiparesis following cerebral infarction affecting left non-dominant side: Secondary | ICD-10-CM | POA: Diagnosis not present

## 2014-07-23 DIAGNOSIS — M1990 Unspecified osteoarthritis, unspecified site: Secondary | ICD-10-CM | POA: Diagnosis not present

## 2014-07-23 DIAGNOSIS — Z9181 History of falling: Secondary | ICD-10-CM | POA: Diagnosis not present

## 2014-07-23 DIAGNOSIS — I4891 Unspecified atrial fibrillation: Secondary | ICD-10-CM | POA: Diagnosis not present

## 2014-07-25 ENCOUNTER — Encounter: Payer: Self-pay | Admitting: Neurology

## 2014-07-25 ENCOUNTER — Ambulatory Visit (INDEPENDENT_AMBULATORY_CARE_PROVIDER_SITE_OTHER): Payer: Medicare Other | Admitting: Neurology

## 2014-07-25 VITALS — BP 123/81 | HR 97 | Wt 134.2 lb

## 2014-07-25 DIAGNOSIS — G3184 Mild cognitive impairment, so stated: Secondary | ICD-10-CM | POA: Insufficient documentation

## 2014-07-25 DIAGNOSIS — E46 Unspecified protein-calorie malnutrition: Secondary | ICD-10-CM

## 2014-07-25 MED ORDER — DIVALPROEX SODIUM 125 MG PO DR TAB: 250.0000 mg | DELAYED_RELEASE_TABLET | Freq: Two times a day (BID) | ORAL | Status: DC

## 2014-07-25 NOTE — Patient Instructions (Addendum)
I had a long d/w patient and her 3 daughters about her recent stroke, risk for recurrent stroke/TIAs, personally independently reviewed imaging studies and stroke evaluation results and answered questions.Continue Eliquis  for secondary stroke prevention given h/o atrial fibrillation and maintain strict control of hypertension with blood pressure goal below 130/90,   and lipids with LDL cholesterol goal below 100 mg/dL. I also advised the patient to eat a healthy diet . Reduce Depakote dose to 250 mg twice daily as she has been headache and seizure free for a while. .I encouraged her to increase participation in cognitively challenging activities like solving crossword puzzles, sudoku. I also advised her about fall precautions.  Followup in the future with me in 3 months or call earlier if needed.   Management of Memory Problems  There are some general things you can do to help manage your memory problems.  Your memory may not in fact recover, but by using techniques and strategies you will be able to manage your memory difficulties better.  1)  Establish a routine.  Try to establish and then stick to a regular routine.  By doing this, you will get used to what to expect and you will reduce the need to rely on your memory.  Also, try to do things at the same time of day, such as taking your medication or checking your calendar first thing in the morning.  Think about think that you can do as a part of a regular routine and make a list.  Then enter them into a daily planner to remind you.  This will help you establish a routine.  2)  Organize your environment.  Organize your environment so that it is uncluttered.  Decrease visual stimulation.  Place everyday items such as keys or cell phone in the same place every day (ie.  Basket next to front door)  Use post it notes with a brief message to yourself (ie. Turn off light, lock the door)  Use labels to indicate where things go (ie. Which cupboards  are for food, dishes, etc.)  Keep a notepad and pen by the telephone to take messages  3)  Memory Aids  A diary or journal/notebook/daily planner  Making a list (shopping list, chore list, to do list that needs to be done)  Using an alarm as a reminder (kitchen timer or cell phone alarm)  Using cell phone to store information (Notes, Calendar, Reminders)  Calendar/White board placed in a prominent position  Post-it notes  In order for memory aids to be useful, you need to have good habits.  It's no good remembering to make a note in your journal if you don't remember to look in it.  Try setting aside a certain time of day to look in journal.  4)  Improving mood and managing fatigue.  There may be other factors that contribute to memory difficulties.  Factors, such as anxiety, depression and tiredness can affect memory.  Regular gentle exercise can help improve your mood and give you more energy.  Simple relaxation techniques may help relieve symptoms of anxiety  Try to get back to completing activities or hobbies you enjoyed doing in the past.  Learn to pace yourself through activities to decrease fatigue.  Find out about some local support groups where you can share experiences with others. Try and achieve 7-8 hours of sleep at night.

## 2014-07-25 NOTE — Progress Notes (Signed)
[facility] [address] [place]. [place] [address] [phone]       OFFICE FOLLOW-UP NOTE  [name] Date of Birth:  November 07, 1924 Medical Record Number:  [id number]   HPI: 65 year Caucasian lady seen for first office follow for the following hospital admission on 03/09/13 for left hemiparesis. She has remote history of right brain subcortical infarct in May 2014 felt to be secondary to atrial fibrillation has been on anticoagulation with cerebral since then. She was admitted multiple times with worsening of hemiparesis in the setting of infection or dehydration. She had an MRI in December 2014 which did not reveal any new right brain stroke but showed and he of restricted diffusion which is patchy in the left parietal region. At that time it was not clear whether this was a stroke or not however the patient's daughter was informed today that she had indeed had a fall a month prior in November 2014 that she had sustained scalp hematoma in the left temporal region and had been seen in the ER and a solitary CT scan of the head was unremarkable and she was sent back to the nursing home. In retrospect the MRI diffusion abnormality in the left parietal region in December 2014 they represent a delayed hemorrhage contusion which may have sustained from a fall in November 2014. It was planned to change as there are 2 to a liquids but the family never made the switch as they were unclear as to what the co-pay for a liquids would cost him. The patient seems to be tolerating that well without any further increased bleeding or bruising. She suffered from a bad cold for 2 weeks and had a minor nasal bleed which stopped. She is currently living at home and getting physical and occupational therapy. She is able to walk with a walker fairly well. She is carefully washed over by 3 daughters. She has had some mild memory difficulties but these are not progressive. Update 11/15/2013 : She returns  for followup of her last visit in March 2015. She did benefit from outpatient physical and occupational therapy but was readmitted in May and was found to have a right corpus callosum infarct as well as bladder infection. I have personally reviewed imaging studies and hospital workup. She was on Xarelto which was switched to eliquis 2.5mg  twice daily. She had a prolonged stay in rehabilitation at [facility] and has just finished home physical and occupational therapy recently. She can walk with a walker but does lean forward sent to the right and requires one-person assist. She still has some residual left-sided weakness from her recent stroke. She is impulsive and does not always call for help. The family feels she is unsafe to walk by herself. The patient is quite frustrated about the situation. Update 07/25/2014 : She returns for follow-up after last visit 8 months ago. She is accompanied by 3 of her daughters who reports that they've noticed some cognitive decline and memory loss since last visit. Patient has been in and out of the hospital this last year multiple times with strokes as well as dehydration and bladder infections. She is presently living at home and has 24-hour care. She spends most of the time in a wheelchair and does not have much social interaction and increases to go out. She is able to toe walk with one-person assist. She has some home therapy every day which makes her walk a little bit. She is tolerating liquids without bleeding  bruising or other side effects. She has not had headaches for quite some time and no definite documented seizures last couple of years. She is currently on valproic acid 375 twice daily. ROS:   14 system review of systems is positive for  activity and appetite change, constipation, frequency of urination, difficulty urinating, back pain, frequent waking up, memory loss, speech difficulty, agitation, confusion, decreased concentration and all other systems  negative PMH:  Past Medical History  Diagnosis Date  . Arthritis   . Hypertension   . Migraine   . Colon polyp   . Hearing difficulty   . Torus palatinus   . Atrial flutter January, 2012  . Stroke 08/02/12     right lenticular nucleus infarct  . Left leg weakness 02/05/2013  . Chronic anticoagulation   . High cholesterol   . Atrial fibrillation     Social History:  History   Social History  . Marital Status: Widowed    Spouse Name: N/A  . Number of Children: 3  . Years of Education: college   Occupational History  . editorial work for [employer]    Social History Main Topics  . Smoking status: Former Research scientist (life sciences)  . Smokeless tobacco: Never Used  . Alcohol Use: 0.0 oz/week     Comment: 08/01/2012 "glass of wine on special occasions"  . Drug Use: No  . Sexual Activity: No   Other Topics Concern  . Not on file   Social History Narrative   Patient lives at home with daughters.   Caffeine use: 1/2-1 cup of coffee occasionally    Medications:   Current Outpatient Prescriptions on File Prior to Visit  Medication Sig Dispense Refill  . acetaminophen (TYLENOL) 500 MG tablet Take 1,000 mg by mouth daily as needed for mild pain or headache.    Marland Kitchen apixaban (ELIQUIS) 2.5 MG TABS tablet Take 1 tablet (2.5 mg total) by mouth 2 (two) times daily. 180 tablet 1  . b complex vitamins tablet Take 1 tablet by mouth 2 (two) times daily.     . Calcium Carb-Cholecalciferol (CALCIUM 600 + D) 600-200 MG-UNIT TABS Take 1 tablet by mouth 2 (two) times daily.    . Cholecalciferol (VITAMIN D) 2000 UNITS tablet Take 2,000 Units by mouth daily.    . divalproex (DEPAKOTE) 125 MG DR tablet TAKE 3 TABLETS (375 MG TOTAL) BY MOUTH 2 (TWO) TIMES DAILY. 540 tablet 1  . Docusate Sodium (COLACE PO) Take by mouth every other day.     . hydrocortisone (ANUSOL-HC) 2.5 % rectal cream Place 1 application rectally 2 (two) times daily. 30 g 0  . hydrocortisone (ANUSOL-HC) 25 MG suppository Place 1 suppository  (25 mg total) rectally 2 (two) times daily. 12 suppository 0  . Multiple Vitamin (MULTIVITAMIN WITH MINERALS) TABS Take 1 tablet by mouth daily.    . SENNA CO by Combination route every other day.    . zoster vaccine live, PF, (ZOSTAVAX) 18841 UNT/0.65ML injection Inject 19,400 Units into the skin once. 1 each 0   No current facility-administered medications on file prior to visit.    Allergies:   Allergies  Allergen Reactions  . Tramadol Hcl Other (See Comments)     lethargy, nausea  . Alendronate Sodium Rash and Other (See Comments)    puffy eyes    Physical Exam General: Frail model nourished looking elderly Caucasian lady seated, in no evident distress Head: head normocephalic and atraumatic. Orohparynx benign Neck: supple with no carotid or supraclavicular bruits Cardiovascular:  regular rate and rhythm, no murmurs Musculoskeletal: no deformity Skin:  no rash/petichiae Vascular:  Normal pulses all extremities Filed Vitals:   07/25/14 1027  BP: 123/81  Pulse: 97   Neurologic Exam Mental Status: Awake and fully alert. Oriented to place and time. Recent and remote memory intact. Attention span, concentration and fund of knowledge slightly diminished. Mood and affect appropriate. Mini-Mental status exam scored 20/30 with deficits in orientation, attention, calculation, recall. Geriatric depression scale 4 not depressed. Clock drawing 2/4. Animal naming test 9. Cranial Nerves: Fundoscopic exam not done . Pupils equal, briskly reactive to light. Extraocular movements full without nystagmus. Visual fields full to confrontation. Hearing diminished slightly bilaterally.. Facial sensation intact. Face, tongue, palate moves normally and symmetrically.  Motor: Normal bulk and tone. Normal strength in all tested extremity muscles except mild weakness of left grip and intrinsic hand muscles. Mild left hemiparesis 4/5. Mild drift left upper and lower extremity Diminished fine finger movements  on the left. Orbits right over left upper extremity. Sensory.: intact to touch and pinprick and vibratory sensation.  Coordination: Rapid alternating movements normal in all extremities. Finger-to-nose and heel-to-shin performed accurately bilaterally. Gait and Station: Deferred as patient sitting in wheelchair and fall risk  Reflexes: 1+ and symmetric. Toes downgoing.      ASSESSMENT: 62 year with remote history of right hemispheric subcortical infarct in May 2014 with recent admission in  May 2015 with new right corpus callosal embolic infarct from atrial fibrillation. She has residual mild left hemiparesis and poor balance and gait difficulties. New memory difficulties likely due to combination of age-related mild cognitive impairment and multiple strokes.  PLAN:  I had a long d/w patient and her 3 daughters about her recent stroke, risk for recurrent stroke/TIAs, personally independently reviewed imaging studies and stroke evaluation results and answered questions.Continue Eliquis  for secondary stroke prevention given h/o atrial fibrillation and maintain strict control of hypertension with blood pressure goal below 130/90,   and lipids with LDL cholesterol goal below 100 mg/dL. I also advised the patient to eat a healthy diet  .I encouraged her to increase participation in cognitively challenging activities like solving crossword puzzles, sudoku. Reduce Depakote dose to 250 mg twice daily as she has been headache and seizure free for a while. I also advised her about fall precautions.  Followup in the future with me in 3 months or call earlier if needed. Antony Contras, MD   Note: This document was prepared with digital dictation and possible smart phrase technology. Any transcriptional errors that result from this process are unintentional

## 2014-07-25 NOTE — Telephone Encounter (Signed)
Forms were faxed on 07/08/2014. JG/CMA

## 2014-07-26 DIAGNOSIS — I69354 Hemiplegia and hemiparesis following cerebral infarction affecting left non-dominant side: Secondary | ICD-10-CM | POA: Diagnosis not present

## 2014-07-26 DIAGNOSIS — I10 Essential (primary) hypertension: Secondary | ICD-10-CM | POA: Diagnosis not present

## 2014-07-26 DIAGNOSIS — Z9181 History of falling: Secondary | ICD-10-CM | POA: Diagnosis not present

## 2014-07-26 DIAGNOSIS — M1990 Unspecified osteoarthritis, unspecified site: Secondary | ICD-10-CM | POA: Diagnosis not present

## 2014-07-26 DIAGNOSIS — Z8781 Personal history of (healed) traumatic fracture: Secondary | ICD-10-CM | POA: Diagnosis not present

## 2014-07-26 DIAGNOSIS — I4891 Unspecified atrial fibrillation: Secondary | ICD-10-CM | POA: Diagnosis not present

## 2014-08-01 ENCOUNTER — Telehealth: Payer: Self-pay | Admitting: *Deleted

## 2014-08-01 NOTE — Telephone Encounter (Signed)
Received home health certification and plan of care via fax from [facility]. Forwarded to [employer]. JG//CMA

## 2014-08-07 NOTE — Telephone Encounter (Signed)
Signed forms faxed to [facility] successfully. Sent for scanning. JG//CMA 

## 2014-08-27 ENCOUNTER — Other Ambulatory Visit: Payer: Self-pay | Admitting: Neurology

## 2014-08-29 ENCOUNTER — Telehealth: Payer: Self-pay | Admitting: *Deleted

## 2014-08-29 NOTE — Telephone Encounter (Signed)
Pt's daughter dropped off FMLA paperwork needed for her job to care for pt. Forms filled out as much as possible and forwarded to [facility]. JG//CMA

## 2014-09-04 ENCOUNTER — Telehealth: Payer: Self-pay | Admitting: Family

## 2014-09-04 DIAGNOSIS — Z7689 Persons encountering health services in other specified circumstances: Secondary | ICD-10-CM

## 2014-09-04 NOTE — Telephone Encounter (Addendum)
Opened in error

## 2014-09-05 NOTE — Telephone Encounter (Signed)
Completed forms faxed to [phone] successfully. Copy placed up front for pt's daughter to pick up. Called and left detailed message for daughter. Copy sent for scanning. JG//CMA

## 2014-09-18 ENCOUNTER — Telehealth: Payer: Self-pay | Admitting: Family Medicine

## 2014-09-18 NOTE — Telephone Encounter (Signed)
OK to place TB test.

## 2014-09-18 NOTE — Telephone Encounter (Signed)
Caller name: Denecia Brunette Relationship to patient: self Can be reached: [phone]  Reason for call: Pt needs the TB test in order to get into respite for a few weeks. [name] needs to bring a form up for [place] to fill out for respite as well. Can she get the TB test done today or Friday? Would prefer today. Please let her know.

## 2014-09-18 NOTE — Telephone Encounter (Signed)
Please advise, since Dr.Lowne is out of the office.     KP

## 2014-09-18 NOTE — Telephone Encounter (Signed)
Spoke with Shona Needles and scheduled nurse visit for 09/20/14 at 2pm.

## 2014-09-19 ENCOUNTER — Telehealth: Payer: PRIVATE HEALTH INSURANCE | Admitting: Family Medicine

## 2014-09-19 DIAGNOSIS — Z111 Encounter for screening for respiratory tuberculosis: Secondary | ICD-10-CM

## 2014-09-19 NOTE — Telephone Encounter (Signed)
Caller name:  jolee  Relation to pt: daughter Call back number:  [phone] Pharmacy:  Reason for call:   Patient daughter dropped off paperwork regarding assisted living facility. States that patient has a tentative admission date for 09/25/14 and will need this before then.

## 2014-09-20 ENCOUNTER — Telehealth: Payer: Self-pay | Admitting: Family Medicine

## 2014-09-20 ENCOUNTER — Ambulatory Visit (INDEPENDENT_AMBULATORY_CARE_PROVIDER_SITE_OTHER): Payer: Medicare Other

## 2014-09-20 DIAGNOSIS — Z7689 Persons encountering health services in other specified circumstances: Secondary | ICD-10-CM

## 2014-09-20 DIAGNOSIS — Z111 Encounter for screening for respiratory tuberculosis: Secondary | ICD-10-CM

## 2014-09-20 NOTE — Telephone Encounter (Signed)
Closed in error.

## 2014-09-20 NOTE — Addendum Note (Signed)
Addended by: Rudene Anda on: 09/20/2014 03:45 PM   Modules accepted: Orders, Medications

## 2014-09-20 NOTE — Telephone Encounter (Signed)
Caller name: [name] Relation to pt: daughter Call back number: [phone] Pharmacy:  Reason for call:   States that patient takes two 500mg  tylenol before bedtime. PRN order for pain or fever

## 2014-09-20 NOTE — Progress Notes (Signed)
Pre visit review using our clinic review tool, if applicable. No additional management support is needed unless otherwise documented below in the visit note. 

## 2014-09-20 NOTE — Telephone Encounter (Signed)
Noted  

## 2014-09-20 NOTE — Telephone Encounter (Signed)
I received a call from pt family to notify you that last shingles vaccine was 01/05/2014.

## 2014-09-20 NOTE — Telephone Encounter (Signed)
Ok to right order for second step TB to be done at facilty

## 2014-09-20 NOTE — Telephone Encounter (Signed)
Pt came in for PPD placement. Daughter inquired about paperwork.  Paperwork has been misplaced.  New FL2 form started and placed in Dr. Etter Sjogren blue folder.  Daughter also requested that Dr. Etter Sjogren write an order for patient to receive 2nd step of TB skin test at facility.

## 2014-09-23 ENCOUNTER — Encounter: Payer: Self-pay | Admitting: *Deleted

## 2014-09-23 ENCOUNTER — Telehealth: Payer: Self-pay | Admitting: *Deleted

## 2014-09-23 LAB — TB SKIN TEST
INDURATION: 0 mm
TB Skin Test: NEGATIVE

## 2014-09-23 NOTE — Telephone Encounter (Signed)
Completed form placed up front for family to pick up. Copy sent for scanning. JG//CMA

## 2014-09-23 NOTE — Telephone Encounter (Signed)
Order printed for TB skin test.    KP

## 2014-09-23 NOTE — Addendum Note (Signed)
Addended by: Ewing Schlein on: 09/23/2014 09:47 AM   Modules accepted: Orders

## 2014-09-23 NOTE — Telephone Encounter (Signed)
Patient presented to office for reading of PPD.  No induration noted- PPD resulted as negative and letter printed for patient's records.  Patient did have a bruise at the site of injection, she noted she was on Eliquis and bruised easily.  Verified by another RN who also noted no induration.

## 2014-09-25 NOTE — Telephone Encounter (Signed)
Noted on 09/20/14.

## 2014-09-27 ENCOUNTER — Telehealth: Payer: Self-pay | Admitting: Family Medicine

## 2014-09-27 NOTE — Telephone Encounter (Signed)
[name] with [facility] would like verbal orders for physical therapy for the patient as she has fallen twince in two days

## 2014-09-27 NOTE — Telephone Encounter (Signed)
Verbal given for PT.       KP

## 2014-10-01 ENCOUNTER — Telehealth: Payer: Self-pay | Admitting: Family Medicine

## 2014-10-01 DIAGNOSIS — Z87311 Personal history of (healed) other pathological fracture: Secondary | ICD-10-CM | POA: Diagnosis not present

## 2014-10-01 DIAGNOSIS — Z7901 Long term (current) use of anticoagulants: Secondary | ICD-10-CM | POA: Diagnosis not present

## 2014-10-01 DIAGNOSIS — Z9181 History of falling: Secondary | ICD-10-CM | POA: Diagnosis not present

## 2014-10-01 DIAGNOSIS — I4892 Unspecified atrial flutter: Secondary | ICD-10-CM | POA: Diagnosis not present

## 2014-10-01 DIAGNOSIS — M545 Low back pain: Secondary | ICD-10-CM | POA: Diagnosis not present

## 2014-10-01 DIAGNOSIS — M150 Primary generalized (osteo)arthritis: Secondary | ICD-10-CM | POA: Diagnosis not present

## 2014-10-01 DIAGNOSIS — I69354 Hemiplegia and hemiparesis following cerebral infarction affecting left non-dominant side: Secondary | ICD-10-CM | POA: Diagnosis not present

## 2014-10-01 DIAGNOSIS — I4891 Unspecified atrial fibrillation: Secondary | ICD-10-CM | POA: Diagnosis not present

## 2014-10-01 DIAGNOSIS — I10 Essential (primary) hypertension: Secondary | ICD-10-CM | POA: Diagnosis not present

## 2014-10-01 NOTE — Telephone Encounter (Signed)
Caller name: Rielly Brunn Relationship to patient: daughter Can be reached: [phone]  Reason for call: Need orders specifically stating 1000mg  acetaminophen every 8 hours prn. They current order is prn for pain and fever but doesn't allow every 8 hours. Please send to [facility]. Fax# [phone]. They cannot give to her until new orders are received.

## 2014-10-01 NOTE — Telephone Encounter (Signed)
[name] with [name] called with their parameters for calling office. Ranges: Temp 95.5-99.5 no call Pulse 50-100 no call Resp 12-25 no call Pulse Ox < 91% call BP > 160/90 call or < 90/60 These readings are during PT treatments.

## 2014-10-01 NOTE — Telephone Encounter (Signed)
LM for call back to clarify what type of parameters, other than standard vitals, are needed.

## 2014-10-01 NOTE — Telephone Encounter (Signed)
Caller name: Kearney Hard   Relation to pt: PT from [facility]  Call back number: [phone]   Reason for call:  Requesting verbal orders for PT 2x 1 3x 4 2x 1 for thrapeutic exercise, activities, gait trainining and pain management. Requesting OT evaluation due to residual weakness from past stroke.

## 2014-10-01 NOTE — Telephone Encounter (Signed)
Caller name: Judson Roch from [facility]  Relation to pt: RN  Call back number: [phone]    Reason for call:  Requesting verbal orders: vital sign parameters

## 2014-10-02 NOTE — Telephone Encounter (Signed)
Please advise if this is ok to change the medication order.      KP

## 2014-10-02 NOTE — Telephone Encounter (Signed)
Verbal given.     KP 

## 2014-10-03 NOTE — Telephone Encounter (Signed)
Please advise      KP 

## 2014-10-03 NOTE — Telephone Encounter (Signed)
Order faxed to [facility].     KP

## 2014-10-03 NOTE — Telephone Encounter (Signed)
Ok to write for q8h

## 2014-10-03 NOTE — Telephone Encounter (Signed)
Kim, patient daughter states that this is the correct dosage. Two tylenol 500mg  every 8 hours as needed for pain, headache, etc.

## 2014-10-04 DIAGNOSIS — I69354 Hemiplegia and hemiparesis following cerebral infarction affecting left non-dominant side: Secondary | ICD-10-CM | POA: Diagnosis not present

## 2014-10-04 DIAGNOSIS — I4892 Unspecified atrial flutter: Secondary | ICD-10-CM | POA: Diagnosis not present

## 2014-10-04 DIAGNOSIS — M150 Primary generalized (osteo)arthritis: Secondary | ICD-10-CM | POA: Diagnosis not present

## 2014-10-04 DIAGNOSIS — I10 Essential (primary) hypertension: Secondary | ICD-10-CM | POA: Diagnosis not present

## 2014-10-04 DIAGNOSIS — I4891 Unspecified atrial fibrillation: Secondary | ICD-10-CM | POA: Diagnosis not present

## 2014-10-04 DIAGNOSIS — M545 Low back pain: Secondary | ICD-10-CM | POA: Diagnosis not present

## 2014-10-07 DIAGNOSIS — I4891 Unspecified atrial fibrillation: Secondary | ICD-10-CM | POA: Diagnosis not present

## 2014-10-07 DIAGNOSIS — I4892 Unspecified atrial flutter: Secondary | ICD-10-CM | POA: Diagnosis not present

## 2014-10-07 DIAGNOSIS — I10 Essential (primary) hypertension: Secondary | ICD-10-CM | POA: Diagnosis not present

## 2014-10-07 DIAGNOSIS — M150 Primary generalized (osteo)arthritis: Secondary | ICD-10-CM | POA: Diagnosis not present

## 2014-10-07 DIAGNOSIS — I69354 Hemiplegia and hemiparesis following cerebral infarction affecting left non-dominant side: Secondary | ICD-10-CM | POA: Diagnosis not present

## 2014-10-07 DIAGNOSIS — M545 Low back pain: Secondary | ICD-10-CM | POA: Diagnosis not present

## 2014-10-08 DIAGNOSIS — I4891 Unspecified atrial fibrillation: Secondary | ICD-10-CM | POA: Diagnosis not present

## 2014-10-08 DIAGNOSIS — I10 Essential (primary) hypertension: Secondary | ICD-10-CM | POA: Diagnosis not present

## 2014-10-08 DIAGNOSIS — M150 Primary generalized (osteo)arthritis: Secondary | ICD-10-CM | POA: Diagnosis not present

## 2014-10-08 DIAGNOSIS — I69354 Hemiplegia and hemiparesis following cerebral infarction affecting left non-dominant side: Secondary | ICD-10-CM | POA: Diagnosis not present

## 2014-10-08 DIAGNOSIS — M545 Low back pain: Secondary | ICD-10-CM | POA: Diagnosis not present

## 2014-10-08 DIAGNOSIS — I4892 Unspecified atrial flutter: Secondary | ICD-10-CM | POA: Diagnosis not present

## 2014-10-10 ENCOUNTER — Telehealth: Payer: Self-pay | Admitting: Family Medicine

## 2014-10-10 DIAGNOSIS — I69354 Hemiplegia and hemiparesis following cerebral infarction affecting left non-dominant side: Secondary | ICD-10-CM | POA: Diagnosis not present

## 2014-10-10 DIAGNOSIS — I4891 Unspecified atrial fibrillation: Secondary | ICD-10-CM | POA: Diagnosis not present

## 2014-10-10 DIAGNOSIS — I4892 Unspecified atrial flutter: Secondary | ICD-10-CM | POA: Diagnosis not present

## 2014-10-10 DIAGNOSIS — M150 Primary generalized (osteo)arthritis: Secondary | ICD-10-CM | POA: Diagnosis not present

## 2014-10-10 DIAGNOSIS — I10 Essential (primary) hypertension: Secondary | ICD-10-CM | POA: Diagnosis not present

## 2014-10-10 DIAGNOSIS — M545 Low back pain: Secondary | ICD-10-CM | POA: Diagnosis not present

## 2014-10-10 NOTE — Telephone Encounter (Signed)
Caller name: Estill Bamberg from [facility]  Relation to pt: Call back number: [phone] Pharmacy:  Reason for call:   Just FYI that she has done an eval on patient

## 2014-10-10 NOTE — Telephone Encounter (Signed)
To MD for FYI ---PT/OT eval.     KP

## 2014-10-16 ENCOUNTER — Telehealth: Payer: Self-pay | Admitting: Family Medicine

## 2014-10-16 NOTE — Telephone Encounter (Signed)
Left message on daughter's voicemail to return my call.  °

## 2014-10-16 NOTE — Telephone Encounter (Signed)
Caller name: Kearney Hard at [facility] Relation to pt: Call back number: [phone] Pharmacy:  Reason for call:   States that patient did PT while in care at [facility]. States that patient is now back at home and family is refusing PT for family

## 2014-10-17 DIAGNOSIS — M545 Low back pain: Secondary | ICD-10-CM | POA: Diagnosis not present

## 2014-10-17 DIAGNOSIS — I4891 Unspecified atrial fibrillation: Secondary | ICD-10-CM | POA: Diagnosis not present

## 2014-10-17 DIAGNOSIS — M150 Primary generalized (osteo)arthritis: Secondary | ICD-10-CM | POA: Diagnosis not present

## 2014-10-17 DIAGNOSIS — I4892 Unspecified atrial flutter: Secondary | ICD-10-CM | POA: Diagnosis not present

## 2014-10-17 DIAGNOSIS — I69354 Hemiplegia and hemiparesis following cerebral infarction affecting left non-dominant side: Secondary | ICD-10-CM | POA: Diagnosis not present

## 2014-10-17 DIAGNOSIS — I10 Essential (primary) hypertension: Secondary | ICD-10-CM | POA: Diagnosis not present

## 2014-10-17 NOTE — Telephone Encounter (Signed)
NOTED

## 2014-10-17 NOTE — Telephone Encounter (Signed)
Caller name:Plummer,Kim Relation to QJ:JHERDEYC  Call back number: [phone]   Reason for call:  Daughter was advised by Gilmore Laroche to return call and ask for [name]

## 2014-10-17 NOTE — Telephone Encounter (Signed)
To MD to make her aware.      KP 

## 2014-10-21 ENCOUNTER — Encounter: Payer: Self-pay | Admitting: Family Medicine

## 2014-10-21 ENCOUNTER — Ambulatory Visit (INDEPENDENT_AMBULATORY_CARE_PROVIDER_SITE_OTHER): Payer: Medicare Other | Admitting: Family Medicine

## 2014-10-21 VITALS — BP 144/89 | HR 84 | Temp 98.1°F | Resp 16 | Ht 67.0 in | Wt 132.8 lb

## 2014-10-21 DIAGNOSIS — R351 Nocturia: Secondary | ICD-10-CM

## 2014-10-21 DIAGNOSIS — Z8673 Personal history of transient ischemic attack (TIA), and cerebral infarction without residual deficits: Secondary | ICD-10-CM

## 2014-10-21 DIAGNOSIS — I10 Essential (primary) hypertension: Secondary | ICD-10-CM | POA: Diagnosis not present

## 2014-10-21 LAB — POCT URINALYSIS DIPSTICK
BILIRUBIN UA: NEGATIVE
Glucose, UA: NEGATIVE
KETONES UA: NEGATIVE
Nitrite, UA: NEGATIVE
RBC UA: POSITIVE
Spec Grav, UA: 1.025
UROBILINOGEN UA: NEGATIVE
pH, UA: 6

## 2014-10-21 NOTE — Progress Notes (Signed)
Patient ID: [name], female    DOB: 02-02-1925  Age: 81 y.o. MRN: [id number]    Subjective:  Subjective HPI [name] presents for face to face evaluation for PT.  She still needs help with transfers.  She is getting stronger and is using the walker more.   She is also having more problems with urinary incontinence esp at night.  No fever, no pain with urination.    Review of Systems  Constitutional: Negative for diaphoresis, appetite change, fatigue and unexpected weight change.  Eyes: Negative for pain, redness and visual disturbance.  Respiratory: Negative for cough, chest tightness, shortness of breath and wheezing.   Cardiovascular: Negative for chest pain, palpitations and leg swelling.  Endocrine: Negative for cold intolerance, heat intolerance, polydipsia, polyphagia and polyuria.  Genitourinary: Negative for dysuria, frequency and difficulty urinating.  Neurological: Positive for weakness. Negative for dizziness, light-headedness, numbness and headaches.       Pt in wheelchair    History Past Medical History  Diagnosis Date  . Arthritis   . Hypertension   . Migraine   . Colon polyp   . Hearing difficulty   . Torus palatinus   . Atrial flutter January, 2012  . Stroke 08/02/12     right lenticular nucleus infarct  . Left leg weakness 02/05/2013  . Chronic anticoagulation   . High cholesterol   . Atrial fibrillation     She has past surgical history that includes Cataract extraction w/ intraocular lens  implant, bilateral (2006-2008); Ganglion cyst excision (Bilateral, 1938,1954,2003,2005); Cardioversion (05/19/2010); Tonsillectomy (~ 1935); Appendectomy (02/19/53); and Mouth surgery.   Her family history includes Aneurysm in her mother; Brain cancer in an other family member; Breast cancer in an other family member; Cancer in her mother; Colon cancer in an other family member. There is no history of Heart attack, Diabetes, or Hypertension.She reports that she has  quit smoking. She has never used smokeless tobacco. She reports that she drinks alcohol. She reports that she does not use illicit drugs.  Current Outpatient Prescriptions on File Prior to Visit  Medication Sig Dispense Refill  . acetaminophen (TYLENOL) 500 MG tablet Take 1,000 mg by mouth every 8 (eight) hours as needed for mild pain or headache.     . b complex vitamins tablet Take 1 tablet by mouth 2 (two) times daily.     . Calcium Carb-Cholecalciferol (CALCIUM 600 + D) 600-200 MG-UNIT TABS Take 1 tablet by mouth 2 (two) times daily.    . Cholecalciferol (VITAMIN D) 2000 UNITS tablet Take 2,000 Units by mouth daily.    . divalproex (DEPAKOTE) 125 MG DR tablet Take 2 tablets (250 mg total) by mouth 2 (two) times daily. 540 tablet 1  . Docusate Sodium (COLACE PO) Take by mouth daily.     Marland Kitchen ELIQUIS 2.5 MG TABS tablet TAKE 1 TABLET (2.5 MG TOTAL) BY MOUTH 2 (TWO) TIMES DAILY. 180 tablet 0  . hydrocortisone (ANUSOL-HC) 2.5 % rectal cream Place 1 application rectally 2 (two) times daily. 30 g 0  . hydrocortisone (ANUSOL-HC) 25 MG suppository Place 1 suppository (25 mg total) rectally 2 (two) times daily. 12 suppository 0  . Multiple Vitamin (MULTIVITAMIN WITH MINERALS) TABS Take 1 tablet by mouth daily.    Marland Kitchen zoster vaccine live, PF, (ZOSTAVAX) 54656 UNT/0.65ML injection Inject 19,400 Units into the skin once. 1 each 0   No current facility-administered medications on file prior to visit.     Objective:  Objective Physical Exam  Constitutional: She is oriented to person, place, and time. She appears well-developed and well-nourished.  HENT:  Head: Normocephalic and atraumatic.  Eyes: Conjunctivae and EOM are normal.  Neck: Normal range of motion. Neck supple. No JVD present. Carotid bruit is not present. No thyromegaly present.  Cardiovascular: Normal rate, regular rhythm and normal heart sounds.   No murmur heard. Pulmonary/Chest: Effort normal and breath sounds normal. No respiratory  distress. She has no wheezes. She has no rales. She exhibits no tenderness.  Abdominal: Soft. She exhibits no distension. There is no tenderness. There is no rebound, no guarding and no CVA tenderness.  Genitourinary: Pelvic exam was performed with patient supine.  Musculoskeletal: She exhibits no edema.  Neurological: She is alert and oriented to person, place, and time.  L hemparesis --no change--  4/5 strength in L upper ext   3/5 in low L ext   Psychiatric: She has a normal mood and affect. Her behavior is normal.   BP 144/89 mmHg  Pulse 84  Temp(Src) 98.1 F (36.7 C) (Oral)  Resp 16  Ht 5\' 7"  (1.702 m)  Wt 132 lb 12.8 oz (60.238 kg)  BMI 20.79 kg/m2  SpO2 95% Wt Readings from Last 3 Encounters:  10/21/14 132 lb 12.8 oz (60.238 kg)  07/25/14 134 lb 3.2 oz (60.873 kg)  06/24/14 132 lb (59.875 kg)     Lab Results  Component Value Date   WBC 5.2 06/24/2014   HGB 14.3 06/24/2014   HCT 42.7 06/24/2014   PLT 174.0 06/24/2014   GLUCOSE 98 06/24/2014   CHOL 233* 06/24/2014   TRIG 96.0 06/24/2014   HDL 81.70 06/24/2014   LDLCALC 132* 06/24/2014   ALT 12 06/24/2014   AST 21 06/24/2014   NA 141 06/24/2014   K 4.0 06/24/2014   CL 105 06/24/2014   CREATININE 0.91 06/24/2014   BUN 19 06/24/2014   CO2 32 06/24/2014   TSH 1.038 10/05/2010   INR 1.72* 08/27/2013   HGBA1C 5.8* 08/28/2013    Dg Cervical Spine Complete  07/15/2014   CLINICAL DATA:  Neck pain and stiffness  EXAM: CERVICAL SPINE  4+ VIEWS  COMPARISON:  None.  FINDINGS: The cervical spine is visualized to the level of C6-7.  The vertebral body heights are maintained. The alignment is normal. The prevertebral soft tissues are normal. There is no acute fracture or static listhesis. There is degenerative disc disease at C3-4, C4-5, C6-7 and to a lesser degree C5-6. There is bilateral uncovertebral degenerative change at C3-4 with foraminal encroachment.  IMPRESSION: Cervical spine spondylosis as described above. No acute  osseous injury of the cervical spine.   Electronically Signed   By: Kathreen Devoid   On: 07/15/2014 11:55   Dg Thoracic Spine 2 View  07/15/2014   CLINICAL DATA:  Neck pain, upper back pain, spine pain  EXAM: THORACIC SPINE - 2 VIEW  COMPARISON:  None.  FINDINGS: There is a lower thoracic spine mild anterior vertebral body compression deformity at approximately T9 of indeterminate age. The remainder the vertebral body heights are maintained. The alignment is anatomic. There is no acute fracture or static listhesis. The disc spaces are maintained.  The visualized portions of the lungs are clear.  IMPRESSION: There is a lower thoracic spine mild anterior vertebral body compression deformity at approximately T9 of indeterminate age.   Electronically Signed   By: Kathreen Devoid   On: 07/15/2014 11:53     Assessment & Plan:  Plan I am having  Ms. Placide maintain her multivitamin with minerals, b complex vitamins, Vitamin D, Calcium Carb-Cholecalciferol, acetaminophen, Docusate Sodium (COLACE PO), zoster vaccine live (PF), hydrocortisone, hydrocortisone, divalproex, and ELIQUIS.  No orders of the defined types were placed in this encounter.    Problem List Items Addressed This Visit      Unprioritized   HTN (hypertension)    Stable On no meds      History of CVA (cerebrovascular accident)    Home PT ordered for pt  Still with L hemparesis-- improving , Leg > arm        Other Visit Diagnoses    Nocturia    -  Primary    Relevant Orders    POCT Urinalysis Dipstick (Completed)    Urinalysis    Urine culture       Follow-up: Return in about 6 months (around 04/23/2015), or if symptoms worsen or fail to improve.  [name], DO

## 2014-10-21 NOTE — Assessment & Plan Note (Signed)
Home PT ordered for pt  Still with L hemparesis-- improving , Leg > arm

## 2014-10-21 NOTE — Assessment & Plan Note (Signed)
Stable  On no meds 

## 2014-10-21 NOTE — Progress Notes (Signed)
Pre visit review using our clinic review tool, if applicable. No additional management support is needed unless otherwise documented below in the visit note. 

## 2014-10-21 NOTE — Patient Instructions (Addendum)
Stroke Prevention Some medical conditions and behaviors are associated with an increased chance of having a stroke. You may prevent a stroke by making healthy choices and managing medical conditions. HOW CAN I REDUCE MY RISK OF HAVING A STROKE?   Stay physically active. Get at least 30 minutes of activity on most or all days.  Do not smoke. It may also be helpful to avoid exposure to secondhand smoke.  Limit alcohol use. Moderate alcohol use is considered to be:  No more than 2 drinks per day for men.  No more than 1 drink per day for nonpregnant women.  Eat healthy foods. This involves:  Eating 5 or more servings of fruits and vegetables a day.  Making dietary changes that address high blood pressure (hypertension), high cholesterol, diabetes, or obesity.  Manage your cholesterol levels.  Making food choices that are high in fiber and low in saturated fat, trans fat, and cholesterol may control cholesterol levels.  Take any prescribed medicines to control cholesterol as directed by your health care provider.  Manage your diabetes.  Controlling your carbohydrate and sugar intake is recommended to manage diabetes.  Take any prescribed medicines to control diabetes as directed by your health care provider.  Control your hypertension.  Making food choices that are low in salt (sodium), saturated fat, trans fat, and cholesterol is recommended to manage hypertension.  Take any prescribed medicines to control hypertension as directed by your health care provider.  Maintain a healthy weight.  Reducing calorie intake and making food choices that are low in sodium, saturated fat, trans fat, and cholesterol are recommended to manage weight.  Stop drug abuse.  Avoid taking birth control pills.  Talk to your health care provider about the risks of taking birth control pills if you are over 35 years old, smoke, get migraines, or have ever had a blood clot.  Get evaluated for sleep  disorders (sleep apnea).  Talk to your health care provider about getting a sleep evaluation if you snore a lot or have excessive sleepiness.  Take medicines only as directed by your health care provider.  For some people, aspirin or blood thinners (anticoagulants) are helpful in reducing the risk of forming abnormal blood clots that can lead to stroke. If you have the irregular heart rhythm of atrial fibrillation, you should be on a blood thinner unless there is a good reason you cannot take them.  Understand all your medicine instructions.  Make sure that other conditions (such as anemia or atherosclerosis) are addressed. SEEK IMMEDIATE MEDICAL CARE IF:   You have sudden weakness or numbness of the face, arm, or leg, especially on one side of the body.  Your face or eyelid droops to one side.  You have sudden confusion.  You have trouble speaking (aphasia) or understanding.  You have sudden trouble seeing in one or both eyes.  You have sudden trouble walking.  You have dizziness.  You have a loss of balance or coordination.  You have a sudden, severe headache with no known cause.  You have new chest pain or an irregular heartbeat. Any of these symptoms may represent a serious problem that is an emergency. Do not wait to see if the symptoms will go away. Get medical help at once. Call your local emergency services (911 in [place] Do not drive yourself to the hospital. Document Released: 04/22/2004 Document Revised: 07/30/2013 Document Reviewed: 09/15/2012 Exit[employer] Patient Information 2015 [employer]. This information is not intended to replace advice given   to you by your health care provider. Make sure you discuss any questions you have with your health care provider. +00

## 2014-10-23 DIAGNOSIS — R351 Nocturia: Secondary | ICD-10-CM | POA: Diagnosis not present

## 2014-10-23 NOTE — Addendum Note (Signed)
Addended by: Tasia Catchings on: 10/23/2014 03:09 PM   Modules accepted: Orders

## 2014-10-24 LAB — URINALYSIS
Bilirubin Urine: NEGATIVE
HGB URINE DIPSTICK: NEGATIVE
Ketones, ur: NEGATIVE
Leukocytes, UA: NEGATIVE
Nitrite: NEGATIVE
PH: 6 (ref 5.0–8.0)
SPECIFIC GRAVITY, URINE: 1.015 (ref 1.000–1.030)
Total Protein, Urine: NEGATIVE
UROBILINOGEN UA: 0.2 (ref 0.0–1.0)
Urine Glucose: NEGATIVE

## 2014-10-24 LAB — URINE CULTURE

## 2014-10-29 ENCOUNTER — Telehealth: Payer: Self-pay | Admitting: Neurology

## 2014-10-29 ENCOUNTER — Ambulatory Visit (INDEPENDENT_AMBULATORY_CARE_PROVIDER_SITE_OTHER): Payer: Medicare Other | Admitting: Neurology

## 2014-10-29 ENCOUNTER — Encounter: Payer: Self-pay | Admitting: Neurology

## 2014-10-29 VITALS — BP 136/85 | HR 93 | Ht 66.0 in | Wt 133.6 lb

## 2014-10-29 DIAGNOSIS — F0390 Unspecified dementia without behavioral disturbance: Secondary | ICD-10-CM

## 2014-10-29 MED ORDER — DONEPEZIL HCL 5 MG PO TABS: 5.0000 mg | ORAL_TABLET | Freq: Every day | ORAL | Status: DC

## 2014-10-29 MED ORDER — DONEPEZIL HCL 10 MG PO TABS: 10.0000 mg | ORAL_TABLET | Freq: Every day | ORAL | Status: DC

## 2014-10-29 NOTE — Telephone Encounter (Signed)
[name] with [name] called regarding donepezil (ARICEPT) 5 MG tablet . RX written for 2 tabs per day but insurance will only pay for 1 tab per day. Need RX written for 10mg . She can be reached at [phone].

## 2014-10-29 NOTE — Progress Notes (Signed)
[facility] [address] [place]. [place] [address] [phone]       OFFICE FOLLOW-UP NOTE  [name] Date of Birth:  January 12, 1925 Medical Record Number:  [id number]   HPI: 47 year Caucasian lady seen for first office follow for the following hospital admission on 03/09/13 for left hemiparesis. She has remote history of right brain subcortical infarct in May 2014 felt to be secondary to atrial fibrillation has been on anticoagulation with cerebral since then. She was admitted multiple times with worsening of hemiparesis in the setting of infection or dehydration. She had an MRI in December 2014 which did not reveal any new right brain stroke but showed and he of restricted diffusion which is patchy in the left parietal region. At that time it was not clear whether this was a stroke or not however the patient's daughter was informed today that she had indeed had a fall a month prior in November 2014 that she had sustained scalp hematoma in the left temporal region and had been seen in the ER and a solitary CT scan of the head was unremarkable and she was sent back to the nursing home. In retrospect the MRI diffusion abnormality in the left parietal region in December 2014 they represent a delayed hemorrhage contusion which may have sustained from a fall in November 2014. It was planned to change as there are 2 to a liquids but the family never made the switch as they were unclear as to what the co-pay for a liquids would cost him. The patient seems to be tolerating that well without any further increased bleeding or bruising. She suffered from a bad cold for 2 weeks and had a minor nasal bleed which stopped. She is currently living at home and getting physical and occupational therapy. She is able to walk with a walker fairly well. She is carefully washed over by 3 daughters. She has had some mild memory difficulties but these are not progressive. Update 11/15/2013 : She returns  for followup of her last visit in March 2015. She did benefit from outpatient physical and occupational therapy but was readmitted in May and was found to have a right corpus callosum infarct as well as bladder infection. I have personally reviewed imaging studies and hospital workup. She was on Xarelto which was switched to eliquis 2.5mg  twice daily. She had a prolonged stay in rehabilitation at [facility] and has just finished home physical and occupational therapy recently. She can walk with a walker but does lean forward sent to the right and requires one-person assist. She still has some residual left-sided weakness from her recent stroke. She is impulsive and does not always call for help. The family feels she is unsafe to walk by herself. The patient is quite frustrated about the situation. Update 07/25/2014 : She returns for follow-up after last visit 8 months ago. She is accompanied by 3 of her daughters who reports that they've noticed some cognitive decline and memory loss since last visit. Patient has been in and out of the hospital this last year multiple times with strokes as well as dehydration and bladder infections. She is presently living at home and has 24-hour care. She spends most of the time in a wheelchair and does not have much social interaction and increases to go out. She is able to toe walk with one-person assist. She has some home therapy every day which makes her walk a little bit. She is tolerating liquids without bleeding  bruising or other side effects. She has not had headaches for quite some time and no definite documented seizures last couple of years. She is currently on valproic acid 375 twice daily. Update 10/29/2014 : She returns for follow-up after last visit 4 months ago. She is accompanied by her daughter who states that her memory and cognitive difficulties are about the same but in fact on Mini-Mental status testing today she shows a decline with score of 17/30. Clock  drawing is only 1/4 in am and naming is only 6. The patient has had complaints of nocturia and has to get up several times at night and does not sleep well at night. She has been to a urologist as well as family physician but the testing for infection has been negative. The patient is not very active during the day and does not engage and interaction. There have been no no issues with agitation, delusions or hallucinations. The family is willing to try Aricept but have concerns about possible side effects. They also want to try some natural supplements to help her sleep but her reluctant to take any sleeping pills. ROS:   14 system review of systems is positive for  activity and appetite change, fatigue, frequency of urination, bladder urgency, insomnia, frequent waking, daytime sleepiness, agitation and all other systems negative PMH:  Past Medical History  Diagnosis Date  . Arthritis   . Hypertension   . Migraine   . Colon polyp   . Hearing difficulty   . Torus palatinus   . Atrial flutter January, 2012  . Stroke 08/02/12     right lenticular nucleus infarct  . Left leg weakness 02/05/2013  . Chronic anticoagulation   . High cholesterol   . Atrial fibrillation     Social History:  History   Social History  . Marital Status: Widowed    Spouse Name: N/A  . Number of Children: 3  . Years of Education: college   Occupational History  . editorial work for [employer]    Social History Main Topics  . Smoking status: Former Research scientist (life sciences)  . Smokeless tobacco: Never Used  . Alcohol Use: No     Comment: 08/01/2012 "glass of wine on special occasions"  . Drug Use: No  . Sexual Activity: No   Other Topics Concern  . Not on file   Social History Narrative   Patient lives at home with daughters.   Caffeine use: 1/2-1 cup of coffee occasionally    Medications:   Current Outpatient Prescriptions on File Prior to Visit  Medication Sig Dispense Refill  . acetaminophen (TYLENOL) 500 MG  tablet Take 1,000 mg by mouth every 8 (eight) hours as needed for mild pain or headache.     . b complex vitamins tablet Take 1 tablet by mouth 2 (two) times daily.     . Calcium Carb-Cholecalciferol (CALCIUM 600 + D) 600-200 MG-UNIT TABS Take 1 tablet by mouth 2 (two) times daily.    . Cholecalciferol (VITAMIN D) 2000 UNITS tablet Take 2,000 Units by mouth daily.    . divalproex (DEPAKOTE) 125 MG DR tablet Take 2 tablets (250 mg total) by mouth 2 (two) times daily. 540 tablet 1  . Docusate Sodium (COLACE PO) Take by mouth daily.     Marland Kitchen ELIQUIS 2.5 MG TABS tablet TAKE 1 TABLET (2.5 MG TOTAL) BY MOUTH 2 (TWO) TIMES DAILY. 180 tablet 0  . hydrocortisone (ANUSOL-HC) 2.5 % rectal cream Place 1 application rectally 2 (two) times daily.  30 g 0  . hydrocortisone (ANUSOL-HC) 25 MG suppository Place 1 suppository (25 mg total) rectally 2 (two) times daily. 12 suppository 0  . Multiple Vitamin (MULTIVITAMIN WITH MINERALS) TABS Take 1 tablet by mouth daily.     No current facility-administered medications on file prior to visit.    Allergies:   Allergies  Allergen Reactions  . Tramadol Hcl Other (See Comments)     lethargy, nausea  . Alendronate Sodium Rash and Other (See Comments)    puffy eyes    Physical Exam General: Frail model nourished looking elderly Caucasian lady seated, in no evident distress Head: head normocephalic and atraumatic.   Neck: supple with no carotid or supraclavicular bruits Cardiovascular: regular rate and rhythm, no murmurs Musculoskeletal: no deformity Skin:  no rash/petichiae Vascular:  Normal pulses all extremities Filed Vitals:   10/29/14 1055  BP: 136/85  Pulse: 93   Neurologic Exam Mental Status: Awake and fully alert. Oriented to place and time. Recent and remote memory intact. Attention span, concentration and fund of knowledge slightly diminished. Mood and affect appropriate. Mini-Mental status exam scored 17/30 with deficits in orientation, attention,  calculation, recall.  Clock drawing 1/4. Animal naming test 6. Cranial Nerves: Fundoscopic exam not done . Pupils equal, briskly reactive to light. Extraocular movements full without nystagmus. Visual fields full to confrontation. Hearing diminished slightly bilaterally.. Facial sensation intact. Face, tongue, palate moves normally and symmetrically.  Motor: Normal bulk and tone. Normal strength in all tested extremity muscles except mild weakness of left grip and intrinsic hand muscles. Mild left hemiparesis 4/5. Mild drift left upper and lower extremity Diminished fine finger movements on the left. Orbits right over left upper extremity. Sensory.: intact to touch and pinprick and vibratory sensation.  Coordination: Rapid alternating movements normal in all extremities. Finger-to-nose and heel-to-shin performed accurately bilaterally. Gait and Station: Deferred as patient sitting in wheelchair and fall risk  Reflexes: 1+ and symmetric. Toes downgoing.      ASSESSMENT: 60 year with remote history of right hemispheric subcortical infarct in May 2014 with recent admission in  May 2015 with new right corpus callosal embolic infarct from atrial fibrillation. She has residual mild left hemiparesis and poor balance and gait difficulties. New memory difficulties likely due to combination of age-related mild mixed dementia and multiple strokes.  PLAN: I had a long discussion with the patient and her daughter regarding her memory loss of cognitive impairment likely representing mild dementia which is mixed senile as well as vascular and possibly early Alzheimer's component as well. I recommend trial of Aricept 5 mg daily with food to be increase if tolerated to 10 mg. I discussed possible side effects with the patient and daughter and advised him to call me if needed. Patient is also having trouble sleeping at night have recommend she try melatonin 3 mg at night and try to be active during the day and avoid  napping. Continue eliquis for secondary stroke prevention .Return for follow-up in 3 months or call earlier if necessary.  Antony Contras, MD   Note: This document was prepared with digital dictation and possible smart phrase technology. Any transcriptional errors that result from this process are unintentional

## 2014-10-29 NOTE — Telephone Encounter (Signed)
Rx has been updated to 5mg  tabs one daily x 4 weeks, then 10mg  tabs one daily thereafter for ins purposes.  The dose and directions remain the same.  Pharmacy has been notified.

## 2014-10-29 NOTE — Patient Instructions (Signed)
I had a long discussion with the patient and her daughter regarding her memory loss of cognitive impairment likely representing mild dementia which is mixed senile as well as vascular and possibly early Alzheimer's component as well. I recommend trial of Aricept 5 mg daily with food to be increase if tolerated to 10 mg. I discussed possible side effects with the patient and daughter and advised him to call me if needed. Patient is also having trouble sleeping at night have recommend she try melatonin 3 mg at night and try to be active during the day and avoid napping. Continue eliquis for secondary stroke prevention .Return for follow-up in 3 months or call earlier if necessary.  Dementia Dementia is a general term for problems with brain function. A person with dementia has memory loss and a hard time with at least one other brain function such as thinking, speaking, or problem solving. Dementia can affect social functioning, how you do your job, your mood, or your personality. The changes may be hidden for a long time. The earliest forms of this disease are usually not detected by family or friends. Dementia can be:  Irreversible.  Potentially reversible.  Partially reversible.  Progressive. This means it can get worse over time. CAUSES  Irreversible dementia causes may include:  Degeneration of brain cells (Alzheimer disease or Lewy body dementia).  Multiple small strokes (vascular dementia).  Infection (chronic meningitis or Creutzfeldt-Jakob disease).  Frontotemporal dementia. This affects younger people, age 1 to 64, compared to those who have Alzheimer disease.  Dementia associated with other disorders like Parkinson disease, Huntington disease, or HIV-associated dementia. Potentially or partially reversible dementia causes may include:  Medicines.  Metabolic causes such as excessive alcohol intake, vitamin B12 deficiency, or thyroid disease.  Masses or pressure in the brain  such as a tumor, blood clot, or hydrocephalus. SIGNS AND SYMPTOMS  Symptoms are often hard to detect. Family members or coworkers may not notice them early in the disease process. Different people with dementia may have different symptoms. Symptoms can include:  A hard time with memory, especially recent memory. Long-term memory may not be impaired.  Asking the same question multiple times or forgetting something someone just said.  A hard time speaking your thoughts or finding certain words.  A hard time solving problems or performing familiar tasks (such as how to use a telephone).  Sudden changes in mood.  Changes in personality, especially increasing moodiness or mistrust.  Depression.  A hard time understanding complex ideas that were never a problem in the past. DIAGNOSIS  There are no specific tests for dementia.   Your health care provider may recommend a thorough evaluation. This is because some forms of dementia can be reversible. The evaluation will likely include a physical exam and getting a detailed history from you and a family member. The history often gives the best clues and suggestions for a diagnosis.  Memory testing may be done. A detailed brain function evaluation called neuropsychologic testing may be helpful.  Lab tests and brain imaging (such as a CT scan or MRI scan) are sometimes important.  Sometimes observation and re-evaluation over time is very helpful. TREATMENT  Treatment depends on the cause.   If the problem is a vitamin deficiency, it may be helped or cured with supplements.  For dementias such as Alzheimer disease, medicines are available to stabilize or slow the course of the disease. There are no cures for this type of dementia.  Your health care provider  can help direct you to groups, organizations, and other health care providers to help with decisions in the care of you or your loved one. HOME CARE INSTRUCTIONS The care of individuals  with dementia is varied and dependent upon the progression of the dementia. The following suggestions are intended for the person living with, or caring for, the person with dementia.  Create a safe environment.  Remove the locks on bathroom doors to prevent the person from accidentally locking himself or herself in.  Use childproof latches on kitchen cabinets and any place where cleaning supplies, chemicals, or alcohol are kept.  Use childproof covers in unused electrical outlets.  Install childproof devices to keep doors and windows secured.  Remove stove knobs or install safety knobs and an automatic shut-off on the stove.  Lower the temperature on water heaters.  Label medicines and keep them locked up.  Secure knives, lighters, matches, power tools, and guns, and keep these items out of reach.  Keep the house free from clutter. Remove rugs or anything that might contribute to a fall.  Remove objects that might break and hurt the person.  Make sure lighting is good, both inside and outside.  Install grab rails as needed.  Use a monitoring device to alert you to falls or other needs for help.  Reduce confusion.  Keep familiar objects and people around.  Use night lights or dim lights at night.  Label items or areas.  Use reminders, notes, or directions for daily activities or tasks.  Keep a simple, consistent routine for waking, meals, bathing, dressing, and bedtime.  Create a calm, quiet environment.  Place large clocks and calendars prominently.  Display emergency numbers and home address near all telephones.  Use cues to establish different times of the day. An example is to open curtains to let the natural light in during the day.   Use effective communication.  Choose simple words and short sentences.  Use a gentle, calm tone of voice.  Be careful not to interrupt.  If the person is struggling to find a word or communicate a thought, try to provide  the word or thought.  Ask one question at a time. Allow the person ample time to answer questions. Repeat the question again if the person does not respond.  Reduce nighttime restlessness.  Provide a comfortable bed.  Have a consistent nighttime routine.  Ensure a regular walking or physical activity schedule. Involve the person in daily activities as much as possible.  Limit napping during the day.  Limit caffeine.  Attend social events that stimulate rather than overwhelm the senses.  Encourage good nutrition and hydration.  Reduce distractions during meal times and snacks.  Avoid foods that are too hot or too cold.  Monitor chewing and swallowing ability.  Continue with routine vision, hearing, dental, and medical screenings.  Give medicines only as directed by the health care provider.  Monitor driving abilities. Do not allow the person to drive when safe driving is no longer possible.  Register with an identification program which could provide location assistance in the event of a missing person situation. SEEK MEDICAL CARE IF:   New behavioral problems start such as moodiness, aggressiveness, or seeing things that are not there (hallucinations).  Any new problem with brain function happens. This includes problems with balance, speech, or falling a lot.  Problems with swallowing develop.  Any symptoms of other illness happen. Small changes or worsening in any aspect of brain function can be  a sign that the illness is getting worse. It can also be a sign of another medical illness such as infection. Seeing a health care provider right away is important. SEEK IMMEDIATE MEDICAL CARE IF:   A fever develops.  New or worsened confusion develops.  New or worsened sleepiness develops.  Staying awake becomes hard to do. Document Released: 09/08/2000 Document Revised: 07/30/2013 Document Reviewed: 08/10/2010 [facility] Patient Information 2015 [place], [place]. This  information is not intended to replace advice given to you by your health care provider. Make sure you discuss any questions you have with your health care provider.

## 2014-10-30 ENCOUNTER — Observation Stay (HOSPITAL_BASED_OUTPATIENT_CLINIC_OR_DEPARTMENT_OTHER)
Admission: EM | Admit: 2014-10-30 | Discharge: 2014-10-31 | Disposition: A | Discharge status: Home / Self Care | Payer: Medicare Other | Attending: Internal Medicine | Admitting: Internal Medicine

## 2014-10-30 ENCOUNTER — Emergency Department (HOSPITAL_BASED_OUTPATIENT_CLINIC_OR_DEPARTMENT_OTHER): Payer: Medicare Other

## 2014-10-30 ENCOUNTER — Encounter (HOSPITAL_BASED_OUTPATIENT_CLINIC_OR_DEPARTMENT_OTHER): Payer: Self-pay | Admitting: Emergency Medicine

## 2014-10-30 DIAGNOSIS — R112 Nausea with vomiting, unspecified: Secondary | ICD-10-CM | POA: Diagnosis not present

## 2014-10-30 DIAGNOSIS — I69354 Hemiplegia and hemiparesis following cerebral infarction affecting left non-dominant side: Secondary | ICD-10-CM | POA: Insufficient documentation

## 2014-10-30 DIAGNOSIS — I4892 Unspecified atrial flutter: Secondary | ICD-10-CM | POA: Diagnosis not present

## 2014-10-30 DIAGNOSIS — Z7901 Long term (current) use of anticoagulants: Secondary | ICD-10-CM

## 2014-10-30 DIAGNOSIS — E785 Hyperlipidemia, unspecified: Secondary | ICD-10-CM | POA: Diagnosis not present

## 2014-10-30 DIAGNOSIS — R0789 Other chest pain: Secondary | ICD-10-CM | POA: Diagnosis not present

## 2014-10-30 DIAGNOSIS — R079 Chest pain, unspecified: Principal | ICD-10-CM | POA: Diagnosis present

## 2014-10-30 DIAGNOSIS — I480 Paroxysmal atrial fibrillation: Secondary | ICD-10-CM | POA: Diagnosis present

## 2014-10-30 DIAGNOSIS — Z87891 Personal history of nicotine dependence: Secondary | ICD-10-CM | POA: Diagnosis not present

## 2014-10-30 DIAGNOSIS — I272 Other secondary pulmonary hypertension: Secondary | ICD-10-CM | POA: Insufficient documentation

## 2014-10-30 DIAGNOSIS — E780 Pure hypercholesterolemia: Secondary | ICD-10-CM | POA: Insufficient documentation

## 2014-10-30 DIAGNOSIS — I10 Essential (primary) hypertension: Secondary | ICD-10-CM | POA: Diagnosis not present

## 2014-10-30 DIAGNOSIS — I4891 Unspecified atrial fibrillation: Secondary | ICD-10-CM | POA: Insufficient documentation

## 2014-10-30 DIAGNOSIS — R6889 Other general symptoms and signs: Secondary | ICD-10-CM | POA: Diagnosis not present

## 2014-10-30 LAB — HEPATIC FUNCTION PANEL
ALT: 12 U/L — ABNORMAL LOW (ref 14–54)
AST: 25 U/L (ref 15–41)
Albumin: 3.6 g/dL (ref 3.5–5.0)
Alkaline Phosphatase: 51 U/L (ref 38–126)
BILIRUBIN DIRECT: 0.1 mg/dL (ref 0.1–0.5)
BILIRUBIN INDIRECT: 0.3 mg/dL (ref 0.3–0.9)
TOTAL PROTEIN: 6.4 g/dL — AB (ref 6.5–8.1)
Total Bilirubin: 0.4 mg/dL (ref 0.3–1.2)

## 2014-10-30 LAB — BASIC METABOLIC PANEL
Anion gap: 10 (ref 5–15)
BUN: 19 mg/dL (ref 6–20)
CO2: 28 mmol/L (ref 22–32)
Calcium: 8.8 mg/dL — ABNORMAL LOW (ref 8.9–10.3)
Chloride: 103 mmol/L (ref 101–111)
Creatinine, Ser: 0.88 mg/dL (ref 0.44–1.00)
GFR calc Af Amer: >60 mL/min (ref >60)
GFR calc non Af Amer: 57 mL/min — ABNORMAL LOW (ref >60)
Glucose, Bld: 170 mg/dL — ABNORMAL HIGH (ref 65–99)
Potassium: 3.8 mmol/L (ref 3.5–5.1)
Sodium: 141 mmol/L (ref 135–145)

## 2014-10-30 LAB — CBC WITH DIFFERENTIAL/PLATELET
Basophils Absolute: 0 10*3/uL (ref 0.0–0.1)
Basophils Relative: 0 % (ref 0–1)
Eosinophils Absolute: 0 10*3/uL (ref 0.0–0.7)
Eosinophils Relative: 0 % (ref 0–5)
HCT: 40 % (ref 36.0–46.0)
Hemoglobin: 13 g/dL (ref 12.0–15.0)
Lymphocytes Relative: 12 % (ref 12–46)
Lymphs Abs: 1.1 10*3/uL (ref 0.7–4.0)
MCH: 30.4 pg (ref 26.0–34.0)
MCHC: 32.5 g/dL (ref 30.0–36.0)
MCV: 93.7 fL (ref 78.0–100.0)
Monocytes Absolute: 0.6 10*3/uL (ref 0.1–1.0)
Monocytes Relative: 7 % (ref 3–12)
Neutro Abs: 6.8 10*3/uL (ref 1.7–7.7)
Neutrophils Relative %: 81 % — ABNORMAL HIGH (ref 43–77)
Platelets: 164 10*3/uL (ref 150–400)
RBC: 4.27 MIL/uL (ref 3.87–5.11)
RDW: 13.4 % (ref 11.5–15.5)
WBC: 8.5 10*3/uL (ref 4.0–10.5)

## 2014-10-30 LAB — LIPASE, BLOOD: LIPASE: 44 U/L (ref 22–51)

## 2014-10-30 LAB — TROPONIN I: Troponin I: <0.03 ng/mL (ref <0.031)

## 2014-10-30 MED ORDER — ONDANSETRON HCL 4 MG/2ML IJ SOLN
INTRAMUSCULAR | Status: AC
  Administered 2014-10-30: 4 mg via INTRAVENOUS
  Filled 2014-10-30: qty 2

## 2014-10-30 MED ORDER — ONDANSETRON HCL 4 MG/2ML IJ SOLN
4.0000 mg | Freq: Once | INTRAMUSCULAR | Status: AC
  Administered 2014-10-30: 4 mg via INTRAVENOUS

## 2014-10-30 NOTE — ED Provider Notes (Signed)
TIME SEEN: 11:50 PM  CHIEF COMPLAINT: Nausea   HPI:  HPI Comments: [name] is a 81 y.o. female with history of hypertension, hyperlipidemia, atrial flutter on Eliquis who presents to the Emergency Department complaining of sudden onset nausea and vomiting that started 4 hours ago. Pt reports an achy, pressure-like left-sided chest pain that has since resolved, lasting for approximately 2 hours. Pain occurred while walking and resolved with rest but after over one hour. No radiation of pain. Described as moderate. She states she did have some dizziness. No shortness of breath or diaphoresis. Daughter reports pt stated she did not feel well approximately 4 hours ago and then began to vomit. Pt was given her Zofran and TUMS which she vomited up. Daughter took pt's BP at home when it was 170/110 mmHg. Pt was evaluated by her neurologist Dr. Leonie Man 2 days ago when she was started on Aricept.  Was told by her  Neurologist that this may cause nausea.  Denies diarrhea, fevers, abdominal pain, current CP, SOB, recent antibiotic courses, recent travel outside of the country or recent hospitalization. Additionally denies CABG or cardiac stents, or PShx to abdomen. Pt is followed by Dr. [name].  ROS: See HPI Constitutional: no fever  Eyes: no drainage  ENT: no runny nose   Cardiovascular:  no chest pain  Resp: no SOB  GI: vomiting GU: no dysuria Integumentary: no rash  Allergy: no hives  Musculoskeletal: no leg swelling  Neurological: no slurred speech ROS otherwise negative  PAST MEDICAL HISTORY/PAST SURGICAL HISTORY:  Past Medical History  Diagnosis Date  . Arthritis   . Hypertension   . Migraine   . Colon polyp   . Hearing difficulty   . Torus palatinus   . Atrial flutter January, 2012  . Stroke 08/02/12     right lenticular nucleus infarct  . Left leg weakness 02/05/2013  . Chronic anticoagulation   . High cholesterol   . Atrial fibrillation     MEDICATIONS:  Prior to  Admission medications   Medication Sig Start Date End Date Taking? Authorizing Provider  acetaminophen (TYLENOL) 500 MG tablet Take 1,000 mg by mouth every 8 (eight) hours as needed for mild pain or headache.     Historical Provider, MD  b complex vitamins tablet Take 1 tablet by mouth 2 (two) times daily.     Historical Provider, MD  Calcium Carb-Cholecalciferol (CALCIUM 600 + D) 600-200 MG-UNIT TABS Take 1 tablet by mouth 2 (two) times daily.    Historical Provider, MD  Cholecalciferol (VITAMIN D) 2000 UNITS tablet Take 2,000 Units by mouth daily.    Historical Provider, MD  divalproex (DEPAKOTE) 125 MG DR tablet Take 2 tablets (250 mg total) by mouth 2 (two) times daily. 07/25/14   Garvin Fila, MD  Docusate Sodium (COLACE PO) Take by mouth daily.     Historical Provider, MD  donepezil (ARICEPT) 10 MG tablet Take 1 tablet (10 mg total) by mouth at bedtime. After completing one month of 5mg  tablets daily 10/29/14   Garvin Fila, MD  donepezil (ARICEPT) 5 MG tablet Take 1 tablet (5 mg total) by mouth at bedtime. Start one tablet daily with food x 4 weeks then change to 10mg  tablet once daily 10/29/14   Garvin Fila, MD  ELIQUIS 2.5 MG TABS tablet TAKE 1 TABLET (2.5 MG TOTAL) BY MOUTH 2 (TWO) TIMES DAILY. 08/27/14   Garvin Fila, MD  hydrocortisone (ANUSOL-HC) 2.5 % rectal cream Place 1 application  rectally 2 (two) times daily. 06/24/14   Rosalita Chessman, DO  hydrocortisone (ANUSOL-HC) 25 MG suppository Place 1 suppository (25 mg total) rectally 2 (two) times daily. 06/24/14   Rosalita Chessman, DO  Multiple Vitamin (MULTIVITAMIN WITH MINERALS) TABS Take 1 tablet by mouth daily.    Historical Provider, MD    ALLERGIES:  Allergies  Allergen Reactions  . Tramadol Hcl Other (See Comments)     lethargy, nausea  . Alendronate Sodium Rash and Other (See Comments)    puffy eyes    SOCIAL HISTORY:  History  Substance Use Topics  . Smoking status: Former Research scientist (life sciences)  . Smokeless tobacco: Never Used  .  Alcohol Use: No     Comment: 08/01/2012 "glass of wine on special occasions"    FAMILY HISTORY: Family History  Problem Relation Age of Onset  . Colon cancer    . Breast cancer    . Brain cancer    . Cancer Mother     breast  . Aneurysm Mother     brain  . Heart attack Neg Hx   . Diabetes Neg Hx   . Hypertension Neg Hx     EXAM: BP 167/95 mmHg  Pulse 76  Temp(Src) 98.8 F (37.1 C) (Oral)  Resp 18  Ht 5\' 6"  (1.676 m)  Wt 133 lb (60.328 kg)  BMI 21.48 kg/m2  SpO2 98% CONSTITUTIONAL: Alert and oriented and responds appropriately to questions. Well-nourished; elderly appears uncomfortable but non toxic and afebrile  HEAD: Normocephalic EYES: Conjunctivae clear, PERRL ENT: normal nose; no rhinorrhea; moist mucous membranes; pharynx without lesions noted NECK: Supple, no meningismus, no LAD  CARD: Irregular; S1 and S2 appreciated; no murmurs, no clicks, no rubs, no gallops RESP: Normal chest excursion without splinting or tachypnea; breath sounds clear and equal bilaterally; no wheezes, no rhonchi, no rales, no hypoxia or respiratory distress, speaking full sentences ABD/GI: Normal bowel sounds; non-distended; soft, non-tender, no rebound, no guarding, no peritoneal signs BACK:  The back appears normal and is non-tender to palpation, there is no CVA tenderness EXT: Normal ROM in all joints; non-tender to palpation; no edema; normal capillary refill; no cyanosis, no calf tenderness or swelling    SKIN: Normal color for age and race; warm NEURO: Moves all extremities equally, sensation to light touch intact diffusely, cranial nerves II through XII intact PSYCH: The patient's mood and manner are appropriate. Grooming and personal hygiene are appropriate.  MEDICAL DECISION MAKING:  Patient here with nausea and vomiting and then an episode of chest pressure with dizziness. Now chest pain-free. EKG shows atrial flutter with variable AV block that is unchanged compared to prior. Her  abdominal exam is benign. We'll give IV fluids, IV Zofran and reassess. We'll obtain cardiac labs, chest x-ray. Anticipate admission for chest pain rule out.   ED PROGRESS: Pt's Labs are unremarkable including a negative troponin. Urine does show moderate leukocytes but also squamous cells and bacteria. Suspect this is a dirty catch. She is not having any urinary symptoms. Nausea has improved after 2 rounds of IV Zofran. Discussed with Dr. Arnoldo Morale with hospitalist service at [facility] who accepts patient for admission for chest pain rule out. Will admit to telemetry, observation. Patient and family are comfortable with this plan.   1:30 AM  Patient is still chest pain-free, hemodynamically stable. [facility] at bedside.   EKG Interpretation  Date/Time:  Wednesday October 30 2014 22:24:59 EDT Ventricular Rate:  76 PR Interval:    QRS Duration: 90  QT Interval:  404 QTC Calculation: 111 R Axis:   34 Text Interpretation:  Atrial flutter with variable A-V block Abnormal ECG Confirmed by Wilson Singer  MD, STEPHEN ([id number]) on 10/30/2014 10:33:05 PM         [place], DO 10/31/14 8022

## 2014-10-30 NOTE — ED Notes (Signed)
Patient comes in after being put on Aricept yesterday. The patient has had N/v since then. The patient reports some chest pressure as well.

## 2014-10-31 ENCOUNTER — Telehealth: Payer: Self-pay | Admitting: Neurology

## 2014-10-31 ENCOUNTER — Encounter (HOSPITAL_COMMUNITY): Payer: Self-pay | Admitting: Internal Medicine

## 2014-10-31 ENCOUNTER — Observation Stay (HOSPITAL_COMMUNITY): Payer: Medicare Other

## 2014-10-31 DIAGNOSIS — R079 Chest pain, unspecified: Secondary | ICD-10-CM | POA: Diagnosis present

## 2014-10-31 DIAGNOSIS — I4891 Unspecified atrial fibrillation: Secondary | ICD-10-CM

## 2014-10-31 DIAGNOSIS — R0789 Other chest pain: Secondary | ICD-10-CM

## 2014-10-31 DIAGNOSIS — I159 Secondary hypertension, unspecified: Secondary | ICD-10-CM

## 2014-10-31 DIAGNOSIS — R112 Nausea with vomiting, unspecified: Secondary | ICD-10-CM

## 2014-10-31 DIAGNOSIS — Z7901 Long term (current) use of anticoagulants: Secondary | ICD-10-CM | POA: Diagnosis not present

## 2014-10-31 LAB — BASIC METABOLIC PANEL
Anion gap: 6 (ref 5–15)
BUN: 14 mg/dL (ref 6–20)
CALCIUM: 8.6 mg/dL — AB (ref 8.9–10.3)
CHLORIDE: 107 mmol/L (ref 101–111)
CO2: 27 mmol/L (ref 22–32)
Creatinine, Ser: 0.85 mg/dL (ref 0.44–1.00)
GFR calc non Af Amer: 60 mL/min — ABNORMAL LOW (ref >60)
Glucose, Bld: 149 mg/dL — ABNORMAL HIGH (ref 65–99)
Potassium: 4.2 mmol/L (ref 3.5–5.1)
SODIUM: 140 mmol/L (ref 135–145)

## 2014-10-31 LAB — URINE MICROSCOPIC-ADD ON

## 2014-10-31 LAB — URINALYSIS, ROUTINE W REFLEX MICROSCOPIC
BILIRUBIN URINE: NEGATIVE
Glucose, UA: NEGATIVE mg/dL
Hgb urine dipstick: NEGATIVE
Ketones, ur: NEGATIVE mg/dL
NITRITE: NEGATIVE
PROTEIN: NEGATIVE mg/dL
Specific Gravity, Urine: 1.014 (ref 1.005–1.030)
Urobilinogen, UA: 0.2 mg/dL (ref 0.0–1.0)
pH: 7.5 (ref 5.0–8.0)

## 2014-10-31 LAB — CBC
HCT: 38.3 % (ref 36.0–46.0)
HEMOGLOBIN: 12.7 g/dL (ref 12.0–15.0)
MCH: 30.8 pg (ref 26.0–34.0)
MCHC: 33.2 g/dL (ref 30.0–36.0)
MCV: 92.7 fL (ref 78.0–100.0)
PLATELETS: 169 10*3/uL (ref 150–400)
RBC: 4.13 MIL/uL (ref 3.87–5.11)
RDW: 13.8 % (ref 11.5–15.5)
WBC: 5.2 10*3/uL (ref 4.0–10.5)

## 2014-10-31 LAB — TROPONIN I
Troponin I: <0.03 ng/mL (ref <0.031)
Troponin I: <0.03 ng/mL (ref <0.031)

## 2014-10-31 MED ORDER — ALUM & MAG HYDROXIDE-SIMETH 200-200-20 MG/5ML PO SUSP: 30.0000 mL | Freq: Four times a day (QID) | ORAL | Status: DC | PRN

## 2014-10-31 MED ORDER — DIVALPROEX SODIUM 250 MG PO DR TAB
250.0000 mg | DELAYED_RELEASE_TABLET | Freq: Two times a day (BID) | ORAL | Status: DC
  Administered 2014-10-31: 250 mg via ORAL
  Filled 2014-10-31 (×2): qty 1

## 2014-10-31 MED ORDER — ASPIRIN 81 MG PO CHEW
324.0000 mg | CHEWABLE_TABLET | Freq: Once | ORAL | Status: AC
  Administered 2014-10-31: 324 mg via ORAL
  Filled 2014-10-31: qty 4

## 2014-10-31 MED ORDER — ACETAMINOPHEN 325 MG PO TABS: 650.0000 mg | ORAL_TABLET | Freq: Four times a day (QID) | ORAL | Status: DC | PRN

## 2014-10-31 MED ORDER — DOCUSATE SODIUM 100 MG PO CAPS: 100.0000 mg | ORAL_CAPSULE | Freq: Two times a day (BID) | ORAL | Status: DC

## 2014-10-31 MED ORDER — SODIUM CHLORIDE 0.9 % IV SOLN: 250.0000 mL | INTRAVENOUS | Status: DC | PRN

## 2014-10-31 MED ORDER — SODIUM CHLORIDE 0.9 % IJ SOLN: 3.0000 mL | INTRAMUSCULAR | Status: DC | PRN

## 2014-10-31 MED ORDER — ASPIRIN 325 MG PO TABS
325.0000 mg | ORAL_TABLET | Freq: Every day | ORAL | Status: DC
  Administered 2014-10-31: 325 mg via ORAL
  Filled 2014-10-31: qty 1

## 2014-10-31 MED ORDER — ONDANSETRON HCL 4 MG PO TABS: 4.0000 mg | ORAL_TABLET | Freq: Four times a day (QID) | ORAL | Status: AC | PRN

## 2014-10-31 MED ORDER — SODIUM CHLORIDE 0.9 % IJ SOLN
3.0000 mL | Freq: Two times a day (BID) | INTRAMUSCULAR | Status: DC
  Administered 2014-10-31 (×2): 3 mL via INTRAVENOUS

## 2014-10-31 MED ORDER — ENSURE ENLIVE PO LIQD
237.0000 mL | Freq: Two times a day (BID) | ORAL | Status: DC
  Administered 2014-10-31 (×2): 237 mL via ORAL

## 2014-10-31 MED ORDER — CALCIUM CARBONATE 1250 (500 CA) MG PO TABS
1.0000 | ORAL_TABLET | Freq: Two times a day (BID) | ORAL | Status: DC
  Administered 2014-10-31: 500 mg via ORAL
  Filled 2014-10-31 (×2): qty 1

## 2014-10-31 MED ORDER — ONDANSETRON HCL 4 MG PO TABS: 4.0000 mg | ORAL_TABLET | Freq: Four times a day (QID) | ORAL | Status: DC | PRN

## 2014-10-31 MED ORDER — PANTOPRAZOLE SODIUM 40 MG IV SOLR
40.0000 mg | INTRAVENOUS | Status: DC
  Administered 2014-10-31: 40 mg via INTRAVENOUS
  Filled 2014-10-31 (×2): qty 40

## 2014-10-31 MED ORDER — ONDANSETRON HCL 4 MG/2ML IJ SOLN
4.0000 mg | Freq: Once | INTRAMUSCULAR | Status: AC
  Administered 2014-10-31: 4 mg via INTRAVENOUS
  Filled 2014-10-31: qty 2

## 2014-10-31 MED ORDER — ACETAMINOPHEN 500 MG PO TABS
1000.0000 mg | ORAL_TABLET | Freq: Three times a day (TID) | ORAL | Status: DC | PRN
  Administered 2014-10-31: 1000 mg via ORAL
  Filled 2014-10-31: qty 2

## 2014-10-31 MED ORDER — SODIUM CHLORIDE 0.9 % IJ SOLN
3.0000 mL | Freq: Two times a day (BID) | INTRAMUSCULAR | Status: DC
  Administered 2014-10-31: 3 mL via INTRAVENOUS

## 2014-10-31 MED ORDER — SODIUM CHLORIDE 0.9 % IV BOLUS (SEPSIS): 500.0000 mL | Freq: Once | INTRAVENOUS | Status: DC

## 2014-10-31 MED ORDER — OXYCODONE HCL 5 MG PO TABS: 5.0000 mg | ORAL_TABLET | ORAL | Status: DC | PRN

## 2014-10-31 MED ORDER — SODIUM CHLORIDE 0.9 % IV SOLN
INTRAVENOUS | Status: AC
  Administered 2014-10-31: via INTRAVENOUS

## 2014-10-31 MED ORDER — SODIUM CHLORIDE 0.9 % IV BOLUS (SEPSIS)
500.0000 mL | Freq: Once | INTRAVENOUS | Status: AC
  Administered 2014-10-31: 500 mL via INTRAVENOUS

## 2014-10-31 MED ORDER — VITAMIN D3 25 MCG (1000 UNIT) PO TABS
2000.0000 [IU] | ORAL_TABLET | Freq: Every day | ORAL | Status: DC
  Filled 2014-10-31: qty 2

## 2014-10-31 MED ORDER — B COMPLEX-C PO TABS
1.0000 | ORAL_TABLET | Freq: Every day | ORAL | Status: DC
  Administered 2014-10-31: 1 via ORAL
  Filled 2014-10-31: qty 1

## 2014-10-31 MED ORDER — ACETAMINOPHEN 650 MG RE SUPP: 650.0000 mg | Freq: Four times a day (QID) | RECTAL | Status: DC | PRN

## 2014-10-31 MED ORDER — NITROGLYCERIN 2 % TD OINT
0.5000 [in_us] | TOPICAL_OINTMENT | Freq: Four times a day (QID) | TRANSDERMAL | Status: DC
  Administered 2014-10-31: 0.5 [in_us] via TOPICAL
  Filled 2014-10-31: qty 30

## 2014-10-31 MED ORDER — ONDANSETRON HCL 4 MG/2ML IJ SOLN: 4.0000 mg | Freq: Four times a day (QID) | INTRAMUSCULAR | Status: DC | PRN

## 2014-10-31 MED ORDER — APIXABAN 2.5 MG PO TABS
2.5000 mg | ORAL_TABLET | Freq: Two times a day (BID) | ORAL | Status: DC
  Administered 2014-10-31: 2.5 mg via ORAL
  Filled 2014-10-31 (×2): qty 1

## 2014-10-31 MED ORDER — HYDROMORPHONE HCL 1 MG/ML IJ SOLN: 0.5000 mg | INTRAMUSCULAR | Status: DC | PRN

## 2014-10-31 NOTE — Progress Notes (Signed)
10/31/2014 4:11 PM D/c avs form, medications already taken today and those due this evening given and explained to patient. Follow up appointments and when to call MD reviewed. HHPT orders reviewed and pt. Safety reviewed. RX reviewed. D/c iv. D/c tele. D/c home per orders.  Tyana Butzer, Arville Lime

## 2014-10-31 NOTE — Care Management Note (Addendum)
Case Management Note  Patient Details  Name: [name] MRN: [id number] Date of Birth: 1924/12/19  Subjective/Objective:       Pt admitted with chest pain              Action/Plan:  Pt is from home with daughters.  Pt has stair lift, cane and walker at home.  Pt will have 24 hour supervision provided by daughters.  If HH is recommended daughter has requested [place].  CM will follow for disposition plan.   Expected Discharge Date:                  Expected Discharge Plan:  Home/Self Care  In-House Referral:     Discharge planning Services  CM Consult  Post Acute Care Choice:    Choice offered to:  Pt/Daughter  DME Arranged:    DME Agency:     HH Arranged:  PT HH Agency:  [facility]  Status of Service:  Complete, will sign off  Medicare Important Message Given:    Date Medicare IM Given:    Medicare IM give by:    Date Additional Medicare IM Given:    Additional Medicare Important Message give by:     If discussed at [place] of Stay Meetings, dates discussed:    Additional Comments: Pt discharge home with daughter, agency will use contact information and schedule initial visit.  CM assessed pt.  Daughter at bedside.  CM contacted [place] as requested, requested information faxed to agency at [phone] attention Levada Dy.  CM contacted Izora Gala (intake worker) [phone], stated pt would be accepted however unsure at this time if services could begin Saturday or Monday.  Beth, Games developer will call back CM and inform of start date of services.  Pt has strong family support at home, daughter is ok if services do not start until Monday.   Maryclare Labrador, RN 10/31/2014, 2:57 PM

## 2014-10-31 NOTE — Discharge Summary (Signed)
Physician Discharge Summary  [name] CWU:[id number] DOB: [birth year] DOA: 10/30/2014  PCP: [name], DO  Admit date: 10/30/2014 Discharge date: 10/31/2014  Recommendations for Outpatient Follow-up:  1.   Discharge Diagnoses:  Principal Problem:   Chest pain Active Problems:   HTN (hypertension)   Atrial fibrillation   Chronic anticoagulation   Nausea and vomiting   Discharge Condition: stable  Diet recommendation: heart healthy  Filed Weights   10/30/14 2227 10/31/14 0241  Weight: 60.328 kg (133 lb) 59.8 kg (131 lb 13.4 oz)    History of present illness:  81 y.o. female with a history of Atrial Fibrillation on Eliquis Rx, HTN, Previous CVA (07/2012)with mild Left sided weakness who presented to the [facility] ED with complaints of Nausea and Vomiting and chest pressure since the evening. She reports having dizziness but no SOB or Diaphoresis. She rate her chest discomfort at a 7/10. She reports that she was started on Aricept 2 days ago And was told by her Doctor that it may cause Nausea and Vomiting. She was evaluated in the ED and was found to have a negative initial Troponin.  Hospital Course:  Observed on telemetry. MI ruled out.  Echo without wall motion abnormality. Normal EF.  Suspect GI related. N/V started after aricept and patient would like to discontinue this.  Procedures:  none  Consultations:  none  Discharge Exam: Filed Vitals:   10/31/14 0517  BP: 138/70  Pulse: 98  Temp: 98.2 F (36.8 C)  Resp: 18    General: a and o Cardiovascular: RRR Respiratory: CTA abd s, nt, nd  Discharge Instructions   Discharge Instructions    Diet general    Complete by:  As directed      Increase activity slowly    Complete by:  As directed      Walk with assistance    Complete by:  As directed      Walker     Complete by:  As directed           Current Discharge Medication List    START taking these medications   Details  ondansetron  (ZOFRAN) 4 MG tablet Take 1 tablet (4 mg total) by mouth every 6 (six) hours as needed for nausea. Qty: 10 tablet, Refills: 0      CONTINUE these medications which have NOT CHANGED   Details  acetaminophen (TYLENOL) 500 MG tablet Take 1,000 mg by mouth every 8 (eight) hours as needed for mild pain or headache.     b complex vitamins tablet Take 1 tablet by mouth 2 (two) times daily.     Calcium Carb-Cholecalciferol (CALCIUM 600 + D) 600-200 MG-UNIT TABS Take 1 tablet by mouth 2 (two) times daily.    Cholecalciferol (VITAMIN D) 2000 UNITS tablet Take 2,000 Units by mouth daily.    divalproex (DEPAKOTE) 125 MG DR tablet Take 2 tablets (250 mg total) by mouth 2 (two) times daily. Qty: 540 tablet, Refills: 1    Docusate Sodium (COLACE PO) Take by mouth daily.     ELIQUIS 2.5 MG TABS tablet TAKE 1 TABLET (2.5 MG TOTAL) BY MOUTH 2 (TWO) TIMES DAILY. Qty: 180 tablet, Refills: 0    hydrocortisone (ANUSOL-HC) 2.5 % rectal cream Place 1 application rectally 2 (two) times daily. Qty: 30 g, Refills: 0   Associated Diagnoses: Other hemorrhoids    hydrocortisone (ANUSOL-HC) 25 MG suppository Place 1 suppository (25 mg total) rectally 2 (two) times daily. Qty: 12 suppository, Refills: 0  Associated Diagnoses: Other hemorrhoids    Multiple Vitamin (MULTIVITAMIN WITH MINERALS) TABS Take 1 tablet by mouth daily.      STOP taking these medications     donepezil (ARICEPT) 10 MG tablet      donepezil (ARICEPT) 5 MG tablet        Allergies  Allergen Reactions  . Tramadol Hcl Other (See Comments)     lethargy, nausea  . Alendronate Sodium Rash and Other (See Comments)    puffy eyes   Follow-up Information    Follow up with [name], DO.   Specialty:  Family Medicine   Contact information:   [address] [place] [address] [phone]        The results of significant diagnostics from this hospitalization (including imaging, microbiology, ancillary and  laboratory) are listed below for reference.    Significant Diagnostic Studies: Dg Chest 2 View  10/31/2014   CLINICAL DATA:  Emesis and chest pain, onset tonight.  EXAM: CHEST  2 VIEW  COMPARISON:  10/10/2013  FINDINGS: There is moderate hyperinflation. There is mild unchanged cardiomegaly and aortic tortuosity. The lungs are clear. Pulmonary vasculature is normal. There are no pleural effusions.  IMPRESSION: Hyperinflation and cardiomegaly, unchanged. No acute cardiopulmonary findings.   Electronically Signed   By: Andreas Newport M.D.   On: 10/31/2014 00:32   Echo Left ventricle: The cavity size was normal. Systolic function was normal. The estimated ejection fraction was in the range of 55% to 60%. Wall motion was normal; there were no regional wall motion abnormalities. The study is not technically sufficient to allow evaluation of LV diastolic function. - Aortic valve: There was mild stenosis. Aortic valve appears to open well and planimetry AVA is 2cm2. Overall Do not suspect moderate AS as suggested by mean PG. - Mitral valve: There was mild regurgitation. - Left atrium: The atrium was mildly to moderately dilated. - Right ventricle: The cavity size was mildly dilated. - Right atrium: The atrium was mildly dilated. - Tricuspid valve: There was moderate regurgitation. - Pulmonary arteries: PA peak pressure: 52 mm Hg (S).  Impressions:  - The right ventricular systolic pressure was increased consistent with moderate pulmonary hypertension.  Microbiology: Recent Results (from the past 240 hour(s))  Urine culture     Status: None   Collection Time: 10/23/14  3:09 PM  Result Value Ref Range Status   Colony Count 7,000 COLONIES/ML  Final   Organism ID, Bacteria Insignificant Growth  Final     Labs: Basic Metabolic Panel:  Recent Labs Lab 10/30/14 2300 10/31/14 0407  NA 141 140  K 3.8 4.2  CL 103 107  CO2 28 27  GLUCOSE 170* 149*  BUN 19 14  CREATININE  0.88 0.85  CALCIUM 8.8* 8.6*   Liver Function Tests:  Recent Labs Lab 10/30/14 2300  AST 25  ALT 12*  ALKPHOS 51  BILITOT 0.4  PROT 6.4*  ALBUMIN 3.6    Recent Labs Lab 10/30/14 2300  LIPASE 44   No results for input(s): AMMONIA in the last 168 hours. CBC:  Recent Labs Lab 10/30/14 2300 10/31/14 0407  WBC 8.5 5.2  NEUTROABS 6.8  --   HGB 13.0 12.7  HCT 40.0 38.3  MCV 93.7 92.7  PLT 164 169   Cardiac Enzymes:  Recent Labs Lab 10/30/14 2300 10/31/14 0407 10/31/14 0908  TROPONINI <0.03 <0.03 <0.03   BNP: BNP (last 3 results) No results for input(s): BNP in the last 8760  hours.  ProBNP (last 3 results) No results for input(s): PROBNP in the last 8760 hours.  CBG: No results for input(s): GLUCAP in the last 168 hours.     SignedDelfina Redwood  [facility] 10/31/2014, 2:14 PM

## 2014-10-31 NOTE — H&P (Addendum)
[facility] Admission History and Physical       RAJANAE MANTIA LOV:[id number] DOB: Oct 09, 1924 DOA: 10/30/2014  Referring physician: EDP PCP: [name], DO  Specialists:   Chief Complaint: Chest Pain and Nausea and Vomiting  HPI: [name] is a 81 y.o. female with a history of Atrial Fibrillation on Eliquis Rx, HTN, Previous CVA (07/2012)with mild Left sided weakness who presented to the [facility] ED with complaints of  Nausea and Vomiting and chest pressure since the evening.   She reports having dizziness but no SOB or Diaphoresis.   She rate her chest discomfort at a 7/10.   She reports that she was started on Aricept  2 days ago  And was told by her Doctor that it may cause Nausea and Vomiting.   She was evaluated in the ED and was found to have a negative initial Troponin.     Review of Systems:  Constitutional: No Weight Loss, No Weight Gain, Night Sweats, Fevers, Chills, Dizziness, Light Headedness, Fatigue, or Generalized Weakness HEENT: No Headaches, Difficulty Swallowing,Tooth/Dental Problems,Sore Throat,  No Sneezing, Rhinitis, Ear Ache, Nasal Congestion, or Post Nasal Drip,  Cardio-vascular: +Chest pain, Orthopnea, PND, Edema in Lower Extremities, Anasarca, Dizziness, Palpitations  Resp: No Dyspnea, No DOE, No Productive Cough, No Non-Productive Cough, No Hemoptysis, No Wheezing.    GI: No Heartburn, Indigestion, Abdominal Pain, +Nausea, +Vomiting, Diarrhea, Constipation, Hematemesis, Hematochezia, Melena, Change in Bowel Habits,  Loss of Appetite  GU: No Dysuria, No Change in Color of Urine, No Urgency or Urinary Frequency, No Flank pain.  Musculoskeletal: No Joint Pain or Swelling, No Decreased Range of Motion, No Back Pain.  Neurologic: No Syncope, No Seizures, Muscle Weakness, Paresthesia, Vision Disturbance or Loss, No Diplopia, +HOH, No Vertigo, No Difficulty Walking,  Skin: No Rash or Lesions. Psych: No Change in Mood or Affect, No Depression or Anxiety, No Memory  loss, No Confusion, or Hallucinations   Past Medical History  Diagnosis Date  . Arthritis   . Hypertension   . Migraine   . Colon polyp   . Hearing difficulty   . Torus palatinus   . Atrial flutter January, 2012  . Stroke 08/02/12     right lenticular nucleus infarct  . Left leg weakness 02/05/2013  . Chronic anticoagulation   . High cholesterol   . Atrial fibrillation      Past Surgical History  Procedure Laterality Date  . Cataract extraction w/ intraocular lens  implant, bilateral  2006-2008  . Ganglion cyst excision Bilateral 1938,1954,2003,2005    "wrists/hand" (08/01/2012)  . Cardioversion  05/19/2010    Dr. Einar Gip  . Tonsillectomy  ~ 1935  . Appendectomy  02/19/53    `  . Mouth surgery      [name]      Prior to Admission medications   Medication Sig Start Date End Date Taking? Authorizing Provider  acetaminophen (TYLENOL) 500 MG tablet Take 1,000 mg by mouth every 8 (eight) hours as needed for mild pain or headache.     Historical Provider, MD  b complex vitamins tablet Take 1 tablet by mouth 2 (two) times daily.     Historical Provider, MD  Calcium Carb-Cholecalciferol (CALCIUM 600 + D) 600-200 MG-UNIT TABS Take 1 tablet by mouth 2 (two) times daily.    Historical Provider, MD  Cholecalciferol (VITAMIN D) 2000 UNITS tablet Take 2,000 Units by mouth daily.    Historical Provider, MD  divalproex (DEPAKOTE) 125 MG DR tablet Take 2 tablets (250 mg total)  by mouth 2 (two) times daily. 07/25/14   Garvin Fila, MD  Docusate Sodium (COLACE PO) Take by mouth daily.     Historical Provider, MD  donepezil (ARICEPT) 10 MG tablet Take 1 tablet (10 mg total) by mouth at bedtime. After completing one month of 5mg  tablets daily 10/29/14   Garvin Fila, MD  donepezil (ARICEPT) 5 MG tablet Take 1 tablet (5 mg total) by mouth at bedtime. Start one tablet daily with food x 4 weeks then change to 10mg  tablet once daily 10/29/14   Garvin Fila, MD  ELIQUIS 2.5 MG TABS tablet TAKE 1 TABLET  (2.5 MG TOTAL) BY MOUTH 2 (TWO) TIMES DAILY. 08/27/14   Garvin Fila, MD  hydrocortisone (ANUSOL-HC) 2.5 % rectal cream Place 1 application rectally 2 (two) times daily. 06/24/14   Rosalita Chessman, DO  hydrocortisone (ANUSOL-HC) 25 MG suppository Place 1 suppository (25 mg total) rectally 2 (two) times daily. 06/24/14   Rosalita Chessman, DO  Multiple Vitamin (MULTIVITAMIN WITH MINERALS) TABS Take 1 tablet by mouth daily.    Historical Provider, MD     Allergies  Allergen Reactions  . Tramadol Hcl Other (See Comments)     lethargy, nausea  . Alendronate Sodium Rash and Other (See Comments)    puffy eyes    Social History:  reports that she has quit smoking. She has never used smokeless tobacco. She reports that she does not drink alcohol or use illicit drugs.    Family History  Problem Relation Age of Onset  . Colon cancer    . Breast cancer    . Brain cancer    . Cancer Mother     breast  . Aneurysm Mother     brain  . Heart attack Neg Hx   . Diabetes Neg Hx   . Hypertension Neg Hx        Physical Exam:  GEN:  Pleasant Elderly Thin 81 y.o. Caucasian female examined and in no acute distress; cooperative with exam Filed Vitals:   10/30/14 2227 10/31/14 0057 10/31/14 0241  BP: 167/95 174/70 155/82  Pulse: 76 78 75  Temp: 98.8 F (37.1 C)  97.4 F (36.3 C)  TempSrc: Oral  Oral  Resp: 18 20 20   Height: 5\' 6"  (1.676 m)  5\' 6"  (1.676 m)  Weight: 60.328 kg (133 lb)  59.8 kg (131 lb 13.4 oz)  SpO2: 98% 98% 98%   Blood pressure 155/82, pulse 75, temperature 97.4 F (36.3 C), temperature source Oral, resp. rate 20, height 5\' 6"  (1.676 m), weight 59.8 kg (131 lb 13.4 oz), SpO2 98 %. PSYCH: She is alert and oriented x4; does not appear anxious does not appear depressed; affect is normal HEENT: Normocephalic and Atraumatic, Mucous membranes pink; PERRLA; EOM intact; Fundi:  Benign;  No scleral icterus, Nares: Patent, Oropharynx: Clear, Sparse Dentition,    Neck:  FROM, No Cervical  Lymphadenopathy nor Thyromegaly or Carotid Bruit; No JVD; Breasts:: Not examined CHEST WALL: No tenderness CHEST: Normal respiration, clear to auscultation bilaterally HEART: Irregular Rhythm; no murmurs rubs or gallops BACK: No kyphosis or scoliosis; No CVA tenderness ABDOMEN: Positive Bowel Sounds, Scaphoid, Soft Non-Tender, No Rebound or Guarding; No Masses, No Organomegaly. Rectal Exam: Not done EXTREMITIES: No Cyanosis, Clubbing, or Edema; No Ulcerations. Genitalia: not examined PULSES: 2+ and symmetric SKIN: Normal hydration no rash or ulceration CNS:  Alert and Oriented x 4, No Acute Deficits, Mild Left Sided Weakness, and +HOH Vascular: pulses palpable throughout  Labs on Admission:  Basic Metabolic Panel:  Recent Labs Lab 10/30/14 2300  NA 141  K 3.8  CL 103  CO2 28  GLUCOSE 170*  BUN 19  CREATININE 0.88  CALCIUM 8.8*   Liver Function Tests:  Recent Labs Lab 10/30/14 2300  AST 25  ALT 12*  ALKPHOS 51  BILITOT 0.4  PROT 6.4*  ALBUMIN 3.6    Recent Labs Lab 10/30/14 2300  LIPASE 44   No results for input(s): AMMONIA in the last 168 hours. CBC:  Recent Labs Lab 10/30/14 2300  WBC 8.5  NEUTROABS 6.8  HGB 13.0  HCT 40.0  MCV 93.7  PLT 164   Cardiac Enzymes:  Recent Labs Lab 10/30/14 2300  TROPONINI <0.03    BNP (last 3 results) No results for input(s): BNP in the last 8760 hours.  ProBNP (last 3 results) No results for input(s): PROBNP in the last 8760 hours.  CBG: No results for input(s): GLUCAP in the last 168 hours.  Radiological Exams on Admission: Dg Chest 2 View  10/31/2014   CLINICAL DATA:  Emesis and chest pain, onset tonight.  EXAM: CHEST  2 VIEW  COMPARISON:  10/10/2013  FINDINGS: There is moderate hyperinflation. There is mild unchanged cardiomegaly and aortic tortuosity. The lungs are clear. Pulmonary vasculature is normal. There are no pleural effusions.  IMPRESSION: Hyperinflation and cardiomegaly, unchanged. No acute  cardiopulmonary findings.   Electronically Signed   By: Andreas Newport M.D.   On: 10/31/2014 00:32     EKG: Independently reviewed. Atrial flutter with AVB rate =76   Assessment/Plan:   81 y.o. female with  Principal Problem:   1.    Chest pain   Telemetry/Cardiac Monitoring   Cycle Troponins   Nitropaste, O2 ASA,    Active Problems:   2.    Nausea and vomiting   Discontinue Aricept Rx   Anti-Emetics PRN     3.    HTN (hypertension)   Monitor BPs   PRN IV Hydralazine for SBP > 160        4.   Atrial fibrillation   Continue Eliquis        5.   Chronic anticoagulation   Continue Eliquis Rx      6.   DVT Prophylaxis   On Eliquis Rx          Code Status:     FULL CODE      Family Communication:   Daughter at Bedside   Disposition Plan:   Observation Status        Time spent:  71 Minutes      [name] [facility] Pager [phone]   If [name] Please Contact the Day Rounding Team MD for [facility]  If 7PM-7AM, Please Contact Night-Floor Coverage  [email or web address] Password [email or web address] 10/31/2014, 3:18 AM     ADDENDUM:   Patient was seen and examined on 10/31/2014

## 2014-10-31 NOTE — Evaluation (Signed)
Physical Therapy Evaluation Patient Details Name: [name] MRN: [id number] DOB: 1925/03/07 Today's Date: 10/31/2014   History of Present Illness  Pt is an 81 y/o female with a PMH of a-fib, HTN, prior CVA with residual L-sided weakness who presented to the [facility] with complaints of N/V and chest pressure.   Clinical Impression  Pt admitted with above diagnosis. Pt currently with functional limitations due to the deficits listed below (see PT Problem List). At the time of PT eval pt was able to perform transfers and ambulation with close guard or min assist for balance and safety. Per daughter, pt has all the equipment she needs at home including a stair lift, and only typically walks ~40 feet at a time. Feel this pt is safe for d/c home today from a PT standpoint. Pt will benefit from skilled PT to increase their independence and safety with mobility to allow discharge to the venue listed below. Will keep on acute caseload until d/c.      Follow Up Recommendations Home health PT;Supervision/Assistance - 24 hour    Equipment Recommendations  None recommended by PT    Recommendations for Other Services       Precautions / Restrictions Precautions Precautions: Fall Precaution Comments: Daughter reports multiple falls at home Restrictions Weight Bearing Restrictions: No      Mobility  Bed Mobility Overal bed mobility: Modified Independent Bed Mobility: Supine to Sit           General bed mobility comments: HOB flat. Use of bed rails and increased time to scoot all the way to EOB.   Transfers Overall transfer level: Needs assistance Equipment used: Rolling walker (2 wheeled) Transfers: Sit to/from Stand Sit to Stand: Min assist         General transfer comment: Increased time to power-up to full standing position. Assist for balance and to initiate transfer.  Ambulation/Gait Ambulation/Gait assistance: Min guard Ambulation Distance (Feet): 75 Feet Assistive device:  Rolling walker (2 wheeled) Gait Pattern/deviations: Step-through pattern;Decreased stride length;Trunk flexed Gait velocity: Decreased Gait velocity interpretation: Below normal speed for age/gender General Gait Details: Pt fatigues quickly. She ambulated well with RW with minimal unsteadiness. Hands-on support for safety.   Stairs            Wheelchair Mobility    Modified Rankin (Stroke Patients Only)       Balance Overall balance assessment: Needs assistance Sitting-balance support: Feet supported;No upper extremity supported Sitting balance-Leahy Scale: Fair     Standing balance support: Bilateral upper extremity supported;During functional activity Standing balance-Leahy Scale: Poor Standing balance comment: Requires UE support.                              Pertinent Vitals/Pain Pain Assessment: No/denies pain    Home Living Family/patient expects to be discharged to:: Private residence Living Arrangements: Children Available Help at Discharge: Family;Available 24 hours/day Type of Home: House Home Access: Stairs to enter   [employer] of Steps: Daughter reports 1 step in from the garage that they have put an intermediate step in so pt does not have to step so high.  Home Layout: Two level Home Equipment: Walker - 2 wheels;Cane - single point;Shower seat - built in;Bedside commode;Transport chair      Prior Function Level of Independence: Needs assistance   Gait / Transfers Assistance Needed: Uses RW on main level of home. HHA upstairs in bathroom due to space limitations for the  walker.   ADL's / Homemaking Assistance Needed: Daughter reports that she and her sisters "do everything" for her, and then states "probably more than we should".         Hand Dominance   Dominant Hand: Right    Extremity/Trunk Assessment   Upper Extremity Assessment: Overall WFL for tasks assessed           Lower Extremity Assessment:  Generalized weakness      Cervical / Trunk Assessment: Kyphotic  Communication   Communication: HOH  Cognition Arousal/Alertness: Awake/alert Behavior During Therapy: WFL for tasks assessed/performed Overall Cognitive Status: History of cognitive impairments - at baseline                      General Comments      Exercises        Assessment/Plan    PT Assessment Patient needs continued PT services  PT Diagnosis Difficulty walking;Generalized weakness   PT Problem List Decreased strength;Decreased range of motion;Decreased activity tolerance;Decreased balance;Decreased mobility;Decreased knowledge of use of DME;Decreased safety awareness;Decreased knowledge of precautions  PT Treatment Interventions DME instruction;Stair training;Gait training;Functional mobility training;Therapeutic activities;Therapeutic exercise;Neuromuscular re-education;Patient/family education   PT Goals (Current goals can be found in the Care Plan section) Acute Rehab PT Goals Patient Stated Goal: Home today PT Goal Formulation: With patient/family Time For Goal Achievement: 11/07/14 Potential to Achieve Goals: Good    Frequency Min 3X/week   Barriers to discharge        Co-evaluation               End of Session Equipment Utilized During Treatment: Gait belt Activity Tolerance: Patient tolerated treatment well Patient left: in chair;with call bell/phone within reach;with family/visitor present Nurse Communication: Mobility status    Functional Assessment Tool Used: Clinical judgement Functional Limitation: Mobility: Walking and moving around Mobility: Walking and Moving Around Current Status [phone]): At least 1 percent but less than 20 percent impaired, limited or restricted Mobility: Walking and Moving Around Goal Status [phone]): At least 1 percent but less than 20 percent impaired, limited or restricted    Time: 1436-1506 PT Time Calculation (min) (ACUTE ONLY): 30  min   Charges:   PT Evaluation $Initial PT Evaluation Tier I: 1 Procedure PT Treatments $Gait Training: 8-22 mins   PT G Codes:   PT G-Codes **NOT FOR INPATIENT CLASS** Functional Assessment Tool Used: Clinical judgement Functional Limitation: Mobility: Walking and moving around Mobility: Walking and Moving Around Current Status ([id number]): At least 1 percent but less than 20 percent impaired, limited or restricted Mobility: Walking and Moving Around Goal Status [phone]): At least 1 percent but less than 20 percent impaired, limited or restricted    Rolinda Roan 10/31/2014, 3:27 PM   Rolinda Roan, PT, DPT Acute Rehabilitation Services Pager: [phone]

## 2014-10-31 NOTE — Progress Notes (Signed)
10/31/2014 1500 Pt. Iv infiltrated. Dr. Conley Canal contacted and made aware. PO intake encouraged instead of re-starting IV at this time since d/c home is planned for this afternoon. Pt. Instructed on same and verbalized understanding. Upon re-assessment, pt. Drank 450 ml water. Pt. States he feels well. Wishes to go home. Dr. Conley Canal paged and made aware. Orders received to d/c pt. Home.  [name], Arville Lime

## 2014-10-31 NOTE — Progress Notes (Signed)
  Echocardiogram 2D Echocardiogram has been performed.  [name] 10/31/2014, 10:29 AM

## 2014-11-01 ENCOUNTER — Telehealth: Payer: Self-pay | Admitting: *Deleted

## 2014-11-01 ENCOUNTER — Telehealth: Payer: Self-pay | Admitting: Family Medicine

## 2014-11-01 DIAGNOSIS — G3184 Mild cognitive impairment, so stated: Secondary | ICD-10-CM | POA: Diagnosis not present

## 2014-11-01 DIAGNOSIS — I10 Essential (primary) hypertension: Secondary | ICD-10-CM | POA: Diagnosis not present

## 2014-11-01 DIAGNOSIS — M1990 Unspecified osteoarthritis, unspecified site: Secondary | ICD-10-CM | POA: Diagnosis not present

## 2014-11-01 DIAGNOSIS — Z7901 Long term (current) use of anticoagulants: Secondary | ICD-10-CM | POA: Diagnosis not present

## 2014-11-01 DIAGNOSIS — Z9181 History of falling: Secondary | ICD-10-CM | POA: Diagnosis not present

## 2014-11-01 DIAGNOSIS — M6281 Muscle weakness (generalized): Secondary | ICD-10-CM | POA: Diagnosis not present

## 2014-11-01 DIAGNOSIS — I4891 Unspecified atrial fibrillation: Secondary | ICD-10-CM | POA: Diagnosis not present

## 2014-11-01 DIAGNOSIS — Z8673 Personal history of transient ischemic attack (TIA), and cerebral infarction without residual deficits: Secondary | ICD-10-CM | POA: Diagnosis not present

## 2014-11-01 DIAGNOSIS — R569 Unspecified convulsions: Secondary | ICD-10-CM | POA: Diagnosis not present

## 2014-11-01 DIAGNOSIS — I4892 Unspecified atrial flutter: Secondary | ICD-10-CM | POA: Diagnosis not present

## 2014-11-01 DIAGNOSIS — R112 Nausea with vomiting, unspecified: Secondary | ICD-10-CM | POA: Diagnosis not present

## 2014-11-01 NOTE — Telephone Encounter (Signed)
Verbal for continued Therapy given to [facility].      KP

## 2014-11-01 NOTE — Telephone Encounter (Signed)
Caller name: Laurey Arrow from [facility] Relation to pt: PT Call back number: [phone]   Reason for call:  Requesting verbal orders for continued therapy

## 2014-11-01 NOTE — Telephone Encounter (Signed)
Spoke with patient's daughter- states he was given Aricept by Dr. Leonie Man and had a bad reaction, had to stay in hospital overnight and now has physical therapy.   Daughter states she does not need to follow-up with Dr. Etter Sjogren, patient has everything she needs and is following up with Dr. Leonie Man.

## 2014-11-05 DIAGNOSIS — M6281 Muscle weakness (generalized): Secondary | ICD-10-CM | POA: Diagnosis not present

## 2014-11-05 DIAGNOSIS — M1990 Unspecified osteoarthritis, unspecified site: Secondary | ICD-10-CM | POA: Diagnosis not present

## 2014-11-05 DIAGNOSIS — G3184 Mild cognitive impairment, so stated: Secondary | ICD-10-CM | POA: Diagnosis not present

## 2014-11-05 DIAGNOSIS — I4891 Unspecified atrial fibrillation: Secondary | ICD-10-CM | POA: Diagnosis not present

## 2014-11-05 DIAGNOSIS — I4892 Unspecified atrial flutter: Secondary | ICD-10-CM | POA: Diagnosis not present

## 2014-11-05 DIAGNOSIS — I10 Essential (primary) hypertension: Secondary | ICD-10-CM | POA: Diagnosis not present

## 2014-11-07 DIAGNOSIS — M6281 Muscle weakness (generalized): Secondary | ICD-10-CM | POA: Diagnosis not present

## 2014-11-07 DIAGNOSIS — I4892 Unspecified atrial flutter: Secondary | ICD-10-CM | POA: Diagnosis not present

## 2014-11-07 DIAGNOSIS — G3184 Mild cognitive impairment, so stated: Secondary | ICD-10-CM | POA: Diagnosis not present

## 2014-11-07 DIAGNOSIS — I4891 Unspecified atrial fibrillation: Secondary | ICD-10-CM | POA: Diagnosis not present

## 2014-11-07 DIAGNOSIS — M1990 Unspecified osteoarthritis, unspecified site: Secondary | ICD-10-CM | POA: Diagnosis not present

## 2014-11-07 DIAGNOSIS — I10 Essential (primary) hypertension: Secondary | ICD-10-CM | POA: Diagnosis not present

## 2014-11-11 ENCOUNTER — Telehealth: Payer: Self-pay | Admitting: *Deleted

## 2014-11-11 DIAGNOSIS — I4891 Unspecified atrial fibrillation: Secondary | ICD-10-CM | POA: Diagnosis not present

## 2014-11-11 DIAGNOSIS — G3184 Mild cognitive impairment, so stated: Secondary | ICD-10-CM | POA: Diagnosis not present

## 2014-11-11 DIAGNOSIS — I10 Essential (primary) hypertension: Secondary | ICD-10-CM | POA: Diagnosis not present

## 2014-11-11 DIAGNOSIS — I4892 Unspecified atrial flutter: Secondary | ICD-10-CM | POA: Diagnosis not present

## 2014-11-11 DIAGNOSIS — M1990 Unspecified osteoarthritis, unspecified site: Secondary | ICD-10-CM | POA: Diagnosis not present

## 2014-11-11 DIAGNOSIS — M6281 Muscle weakness (generalized): Secondary | ICD-10-CM | POA: Diagnosis not present

## 2014-11-11 NOTE — Telephone Encounter (Signed)
Received home health certification and plan of care via fax from [facility] for certification period: 11/01/2014 - 12/29/2014 . Forwarded to Dr. Etter Sjogren. JG//CMA

## 2014-11-13 DIAGNOSIS — I4892 Unspecified atrial flutter: Secondary | ICD-10-CM | POA: Diagnosis not present

## 2014-11-13 DIAGNOSIS — M6281 Muscle weakness (generalized): Secondary | ICD-10-CM | POA: Diagnosis not present

## 2014-11-13 DIAGNOSIS — M1990 Unspecified osteoarthritis, unspecified site: Secondary | ICD-10-CM | POA: Diagnosis not present

## 2014-11-13 DIAGNOSIS — I10 Essential (primary) hypertension: Secondary | ICD-10-CM | POA: Diagnosis not present

## 2014-11-13 DIAGNOSIS — G3184 Mild cognitive impairment, so stated: Secondary | ICD-10-CM | POA: Diagnosis not present

## 2014-11-13 DIAGNOSIS — I4891 Unspecified atrial fibrillation: Secondary | ICD-10-CM | POA: Diagnosis not present

## 2014-11-13 NOTE — Telephone Encounter (Signed)
Faxed successfully to [facility]. Sent for scanning. JG//CMA  

## 2014-11-15 ENCOUNTER — Telehealth: Payer: Self-pay

## 2014-11-15 DIAGNOSIS — M6281 Muscle weakness (generalized): Secondary | ICD-10-CM | POA: Diagnosis not present

## 2014-11-15 NOTE — Telephone Encounter (Signed)
Receive via fax.  Order for D/C OT Eval. Daughter declines OT assessment at this time.  Both placed in Dr. Nonda Lou red folder for review and signature.

## 2014-11-15 NOTE — Telephone Encounter (Signed)
Receive via fax.  Orders for SN, PT, and OT.  Certification Period: 11/01/14 to 12/30/14.

## 2014-11-18 DIAGNOSIS — M1990 Unspecified osteoarthritis, unspecified site: Secondary | ICD-10-CM | POA: Diagnosis not present

## 2014-11-18 DIAGNOSIS — G3184 Mild cognitive impairment, so stated: Secondary | ICD-10-CM | POA: Diagnosis not present

## 2014-11-18 DIAGNOSIS — I10 Essential (primary) hypertension: Secondary | ICD-10-CM | POA: Diagnosis not present

## 2014-11-18 DIAGNOSIS — I4891 Unspecified atrial fibrillation: Secondary | ICD-10-CM | POA: Diagnosis not present

## 2014-11-18 DIAGNOSIS — I4892 Unspecified atrial flutter: Secondary | ICD-10-CM | POA: Diagnosis not present

## 2014-11-18 DIAGNOSIS — M6281 Muscle weakness (generalized): Secondary | ICD-10-CM | POA: Diagnosis not present

## 2014-11-18 NOTE — Telephone Encounter (Signed)
Notes routed to Murtis Sink, [place].

## 2014-11-20 DIAGNOSIS — M1990 Unspecified osteoarthritis, unspecified site: Secondary | ICD-10-CM | POA: Diagnosis not present

## 2014-11-20 DIAGNOSIS — I10 Essential (primary) hypertension: Secondary | ICD-10-CM | POA: Diagnosis not present

## 2014-11-20 DIAGNOSIS — G3184 Mild cognitive impairment, so stated: Secondary | ICD-10-CM | POA: Diagnosis not present

## 2014-11-20 DIAGNOSIS — I4891 Unspecified atrial fibrillation: Secondary | ICD-10-CM | POA: Diagnosis not present

## 2014-11-20 DIAGNOSIS — I4892 Unspecified atrial flutter: Secondary | ICD-10-CM | POA: Diagnosis not present

## 2014-11-20 DIAGNOSIS — M6281 Muscle weakness (generalized): Secondary | ICD-10-CM | POA: Diagnosis not present

## 2014-11-21 NOTE — Telephone Encounter (Signed)
Faxed successfully. Sent for scanning. JG//CMA 

## 2014-11-25 ENCOUNTER — Other Ambulatory Visit: Payer: Self-pay | Admitting: Neurology

## 2014-11-25 NOTE — Telephone Encounter (Signed)
Dose changed at [place] in April

## 2014-11-27 ENCOUNTER — Other Ambulatory Visit: Payer: Self-pay

## 2014-11-27 DIAGNOSIS — I4892 Unspecified atrial flutter: Secondary | ICD-10-CM | POA: Diagnosis not present

## 2014-11-27 DIAGNOSIS — I10 Essential (primary) hypertension: Secondary | ICD-10-CM | POA: Diagnosis not present

## 2014-11-27 DIAGNOSIS — M6281 Muscle weakness (generalized): Secondary | ICD-10-CM | POA: Diagnosis not present

## 2014-11-27 DIAGNOSIS — I4891 Unspecified atrial fibrillation: Secondary | ICD-10-CM | POA: Diagnosis not present

## 2014-11-27 DIAGNOSIS — G3184 Mild cognitive impairment, so stated: Secondary | ICD-10-CM | POA: Diagnosis not present

## 2014-11-27 DIAGNOSIS — M1990 Unspecified osteoarthritis, unspecified site: Secondary | ICD-10-CM | POA: Diagnosis not present

## 2014-11-27 MED ORDER — APIXABAN 2.5 MG PO TABS: ORAL_TABLET | ORAL | Status: DC

## 2014-12-11 ENCOUNTER — Ambulatory Visit (INDEPENDENT_AMBULATORY_CARE_PROVIDER_SITE_OTHER): Payer: Medicare Other | Admitting: Internal Medicine

## 2014-12-11 ENCOUNTER — Encounter: Payer: Self-pay | Admitting: Internal Medicine

## 2014-12-11 VITALS — BP 122/76 | HR 91 | Temp 97.7°F | Resp 20 | Ht 66.0 in | Wt 126.8 lb

## 2014-12-11 DIAGNOSIS — Z23 Encounter for immunization: Secondary | ICD-10-CM | POA: Diagnosis not present

## 2014-12-11 DIAGNOSIS — N3941 Urge incontinence: Secondary | ICD-10-CM | POA: Diagnosis not present

## 2014-12-11 DIAGNOSIS — M2550 Pain in unspecified joint: Secondary | ICD-10-CM | POA: Diagnosis not present

## 2014-12-11 DIAGNOSIS — M810 Age-related osteoporosis without current pathological fracture: Secondary | ICD-10-CM

## 2014-12-11 DIAGNOSIS — Z8673 Personal history of transient ischemic attack (TIA), and cerebral infarction without residual deficits: Secondary | ICD-10-CM | POA: Diagnosis not present

## 2014-12-11 DIAGNOSIS — F0150 Vascular dementia without behavioral disturbance: Secondary | ICD-10-CM

## 2014-12-11 DIAGNOSIS — I482 Chronic atrial fibrillation, unspecified: Secondary | ICD-10-CM

## 2014-12-11 MED ORDER — OXYBUTYNIN CHLORIDE 5 MG PO TABS: 5.0000 mg | ORAL_TABLET | Freq: Two times a day (BID) | ORAL | Status: DC

## 2014-12-11 NOTE — Progress Notes (Signed)
Patient ID: [name], female   DOB: 12/12/1924, 81 y.o.   MRN: [id number]    Location:     [facility]   Place of Service:  OFFICE    Advanced Directive information Does patient have an advance directive?: Yes  Chief Complaint  Patient presents with  . Establish Care    New patient Establish care  . Immunizations    will be getting flu shot today  . Medical Management of Chronic Issues    HPI:  81 yo female seen today as a new pt. Changing PCPs. Daughters are c/a urinary frequency/urgency with frequent straining. Goes to bathroom every 1-2 hrs with most times producing very little urine. Sx's x 1 yr. She has nocturia q1-2 hrs. Sx's worse with increased anxiety. When she takes Tylenol PM and melatonin qhs, sx's better. She has seen urology in the past and had complete w/u. Takes B complex vitamins daily   Afib/HTN - Dr Einar Gip. Takes eliquis. Rate controlled off med. She has hx frequent hypotension.  Hx CVA with left hemiparesis/TIAs - Dr Leonie Man. Takes depakote for sz prophylaxis and migraine.  Memory loss - sees neurology. Has tried aricept but it caused GI upset after 2 doses.  Arthritis - on tylenol prn.  Osteoporosis - has taken prolia in the past. Last dose 11/27/13. Currently on Vit D and calcium  Hemorrhoids - no bleeding. She takes stool softener and yogurt daily. Increased bran in diet  She has been in hospital for dehydration in the past  Past Medical History  Diagnosis Date  . Arthritis   . Hypertension   . Migraine   . Colon polyp   . Hearing difficulty   . Torus palatinus   . Atrial flutter January, 2012  . Stroke 08/02/12     right lenticular nucleus infarct  . Left leg weakness 02/05/2013  . Chronic anticoagulation   . High cholesterol   . Atrial fibrillation     on Eliquis Rx  . Mild cognitive impairment with memory loss   . Malnutrition   . TIA (transient ischemic attack)   . Constipation   . Osteoporosis   . Hemorrhoids     Past Surgical History    Procedure Laterality Date  . Cataract extraction w/ intraocular lens  implant, bilateral  2006-2008  . Ganglion cyst excision Bilateral 1938,1954,2003,2005    "wrists/hand" (08/01/2012)  . Cardioversion  05/19/2010    Dr. Einar Gip  . Tonsillectomy  ~ 1935  . Appendectomy  02/19/53    `  . Mouth surgery      [name]    Patient Care Team: Rosalita Chessman, DO as PCP - General (Family Medicine)  Social History   Social History  . Marital Status: Widowed    Spouse Name: N/A  . Number of Children: 3  . Years of Education: college   Occupational History  . editorial work for [employer]    Social History Main Topics  . Smoking status: Former Research scientist (life sciences)  . Smokeless tobacco: Never Used  . Alcohol Use: No     Comment: 08/01/2012 "glass of wine on special occasions"  . Drug Use: No  . Sexual Activity: No   Other Topics Concern  . Not on file   Social History Narrative   Patient lives at home with daughters.   Caffeine use: 1/2-1 cup of coffee occasionally         Diet: Regular      Do you drink/ eat things with caffeine? Yes  Marital status:   Widowed                            What year were you married ?       Do you live in a house, apartment,assistred living, condo, trailer, etc.)? House      Is it one or more stories? Yes      How many persons live in your home ? 4      Do you have any pets in your home ?(please list) Yes/ 1 Dog, 2 Cats      Current or past profession: Editor      Do you exercise? No                             Type & how often:      Do you have a living will? Yes      Do you have a DNR form?  Yes                     If not, do you want to discuss one?       Do you have signed POA?HPOA forms? Yes                If so, please bring to your        appointment      regular     reports that she has quit smoking. She has never used smokeless tobacco. She reports that she does not drink alcohol or use illicit drugs.  Family History  Problem  Relation Age of Onset  . Colon cancer Father   . Breast cancer    . Brain cancer    . Cancer Mother     breast  . Aneurysm Mother     brain  . Heart attack Neg Hx   . Diabetes Neg Hx   . Hypertension Neg Hx   . Stroke Mother    Family Status  Relation Status Death Age  . Mother Other     blood clot to brain  . Father Deceased     old age  . Daughter Alive   . Daughter Alive   . Daughter Alive     Immunization History  Administered Date(s) Administered  . Influenza Split 01/18/2011  . Influenza Whole 02/10/2009, 12/09/2009  . Influenza, High Dose Seasonal PF 01/26/2013  . Influenza,inj,Quad PF,36+ Mos 11/27/2013  . PPD Test 09/20/2014  . Pneumococcal Conjugate-13 10/19/2013  . Pneumococcal Polysaccharide-23 05/06/2009  . Td 02/05/2008  . Zoster 01/05/2014    Allergies  Allergen Reactions  . Tramadol Hcl Other (See Comments)     lethargy, nausea  . Alendronate Sodium Rash and Other (See Comments)    puffy eyes    Medications: Patient's Medications  New Prescriptions   No medications on file  Previous Medications   ACETAMINOPHEN (TYLENOL) 500 MG TABLET    Take 1,000 mg by mouth every 8 (eight) hours as needed for mild pain or headache.    APIXABAN (ELIQUIS) 2.5 MG TABS TABLET    TAKE 1 TABLET (2.5 MG TOTAL) BY MOUTH 2 (TWO) TIMES DAILY.   B COMPLEX VITAMINS TABLET    Take 1 tablet by mouth 2 (two) times daily.    CALCIUM CARB-CHOLECALCIFEROL (CALCIUM 600 + D) 600-200 MG-UNIT TABS    Take 1 tablet by mouth 2 (two) times daily.  CHOLECALCIFEROL (VITAMIN D) 2000 UNITS TABLET    Take 2,000 Units by mouth daily.   DIPHENHYDRAMINE-ACETAMINOPHEN (TYLENOL PM) 25-500 MG TABS    Take 1 tablet by mouth at bedtime as needed.   DIVALPROEX (DEPAKOTE) 125 MG DR TABLET    Take 2 tablets (250 mg total) by mouth 2 (two) times daily.   DOCUSATE SODIUM (COLACE PO)    Take by mouth daily.    HYDROCORTISONE (ANUSOL-HC) 2.5 % RECTAL CREAM    Place 1 application rectally 2 (two) times  daily.   HYDROCORTISONE (ANUSOL-HC) 25 MG SUPPOSITORY    Place 1 suppository (25 mg total) rectally 2 (two) times daily.   MELATONIN 3 MG TABS    Take 1 tablet by mouth at bedtime   MULTIPLE VITAMIN (MULTIVITAMIN WITH MINERALS) TABS    Take 1 tablet by mouth daily.   ONDANSETRON (ZOFRAN) 4 MG TABLET    Take 1 tablet (4 mg total) by mouth every 6 (six) hours as needed for nausea.  Modified Medications   No medications on file  Discontinued Medications   No medications on file    Review of Systems  Unable to perform ROS: Dementia  Constitutional: Positive for appetite change.  HENT: Positive for dental problem (partial upper denture), hearing loss (uses hearing aid) and rhinorrhea.   Eyes: Positive for visual disturbance (cats).  Gastrointestinal: Positive for constipation.  Genitourinary: Positive for urgency and frequency.  Musculoskeletal: Positive for gait problem.  Neurological: Positive for weakness.       Loss of balance  Psychiatric/Behavioral: Positive for confusion. The patient is nervous/anxious (with feeling obsessed).   per new pt packet  Filed Vitals:   12/11/14 1055  BP: 122/76  Pulse: 91  Temp: 97.7 F (36.5 C)  TempSrc: Oral  Resp: 20  Height: $Remove'5\' 6"'niltKSj$  (1.676 m)  Weight: 126 lb 12.8 oz (57.516 kg)  SpO2: 97%   Body mass index is 20.48 kg/(m^2).  Physical Exam  Constitutional: She appears well-developed and well-nourished.  Sitting in w/c  HENT:  Mouth/Throat: Oropharynx is clear and moist. No oropharyngeal exudate.  Eyes: Pupils are equal, round, and reactive to light. No scleral icterus.  Neck: Neck supple. Carotid bruit is not present. No tracheal deviation present. No thyromegaly present.  Cardiovascular: Normal rate and intact distal pulses.  An irregularly irregular rhythm present. Exam reveals no gallop and no friction rub.   Murmur heard.  Systolic murmur is present with a grade of 1/6  No LE edema b/l. no calf TTP.   Pulmonary/Chest: Effort normal  and breath sounds normal. No stridor. No respiratory distress. She has no wheezes. She has no rales.  Abdominal: Soft. Bowel sounds are normal. She exhibits no distension and no mass. There is no hepatomegaly. There is no tenderness. There is no rebound and no guarding.  Musculoskeletal: She exhibits edema and tenderness.  LUE contracture noted at elbow  Lymphadenopathy:    She has no cervical adenopathy.  Neurological: She is alert. She has normal reflexes.  LUE strength 4/5; LLE 3/5  Skin: Skin is warm and dry. No rash noted.  Psychiatric: She has a normal mood and affect. Her behavior is normal.     Labs reviewed: Admission on 10/30/2014, Discharged on 10/31/2014  Component Date Value Ref Range Status  . WBC 10/30/2014 8.5  4.0 - 10.5 K/uL Final  . RBC 10/30/2014 4.27  3.87 - 5.11 MIL/uL Final  . Hemoglobin 10/30/2014 13.0  12.0 - 15.0 g/dL Final  . HCT 10/30/2014  40.0  36.0 - 46.0 % Final  . MCV 10/30/2014 93.7  78.0 - 100.0 fL Final  . MCH 10/30/2014 30.4  26.0 - 34.0 pg Final  . MCHC 10/30/2014 32.5  30.0 - 36.0 g/dL Final  . RDW 10/30/2014 13.4  11.5 - 15.5 % Final  . Platelets 10/30/2014 164  150 - 400 K/uL Final  . Neutrophils Relative % 10/30/2014 81* 43 - 77 % Final  . Neutro Abs 10/30/2014 6.8  1.7 - 7.7 K/uL Final  . Lymphocytes Relative 10/30/2014 12  12 - 46 % Final  . Lymphs Abs 10/30/2014 1.1  0.7 - 4.0 K/uL Final  . Monocytes Relative 10/30/2014 7  3 - 12 % Final  . Monocytes Absolute 10/30/2014 0.6  0.1 - 1.0 K/uL Final  . Eosinophils Relative 10/30/2014 0  0 - 5 % Final  . Eosinophils Absolute 10/30/2014 0.0  0.0 - 0.7 K/uL Final  . Basophils Relative 10/30/2014 0  0 - 1 % Final  . Basophils Absolute 10/30/2014 0.0  0.0 - 0.1 K/uL Final  . Sodium 10/30/2014 141  135 - 145 mmol/L Final  . Potassium 10/30/2014 3.8  3.5 - 5.1 mmol/L Final  . Chloride 10/30/2014 103  101 - 111 mmol/L Final  . CO2 10/30/2014 28  22 - 32 mmol/L Final  . Glucose, Bld 10/30/2014  170* 65 - 99 mg/dL Final  . BUN 10/30/2014 19  6 - 20 mg/dL Final  . Creatinine, Ser 10/30/2014 0.88  0.44 - 1.00 mg/dL Final  . Calcium 10/30/2014 8.8* 8.9 - 10.3 mg/dL Final  . GFR calc non Af Amer 10/30/2014 57* >60 mL/min Final  . GFR calc Af Amer 10/30/2014 >60  >60 mL/min Final   Comment: (NOTE) The eGFR has been calculated using the CKD EPI equation. This calculation has not been validated in all clinical situations. eGFR's persistently <60 mL/min signify possible Chronic Kidney Disease.   . Anion gap 10/30/2014 10  5 - 15 Final  . Troponin I 10/30/2014 <0.03  <0.031 ng/mL Final   Comment:        NO INDICATION OF MYOCARDIAL INJURY.   . Total Protein 10/30/2014 6.4* 6.5 - 8.1 g/dL Final  . Albumin 10/30/2014 3.6  3.5 - 5.0 g/dL Final  . AST 10/30/2014 25  15 - 41 U/L Final  . ALT 10/30/2014 12* 14 - 54 U/L Final  . Alkaline Phosphatase 10/30/2014 51  38 - 126 U/L Final  . Total Bilirubin 10/30/2014 0.4  0.3 - 1.2 mg/dL Final  . Bilirubin, Direct 10/30/2014 0.1  0.1 - 0.5 mg/dL Final  . Indirect Bilirubin 10/30/2014 0.3  0.3 - 0.9 mg/dL Final  . Lipase 10/30/2014 44  22 - 51 U/L Final  . Color, Urine 10/30/2014 YELLOW  YELLOW Final  . APPearance 10/30/2014 CLOUDY* CLEAR Final  . Specific Gravity, Urine 10/30/2014 1.014  1.005 - 1.030 Final  . pH 10/30/2014 7.5  5.0 - 8.0 Final  . Glucose, UA 10/30/2014 NEGATIVE  NEGATIVE mg/dL Final  . Hgb urine dipstick 10/30/2014 NEGATIVE  NEGATIVE Final  . Bilirubin Urine 10/30/2014 NEGATIVE  NEGATIVE Final  . Ketones, ur 10/30/2014 NEGATIVE  NEGATIVE mg/dL Final  . Protein, ur 10/30/2014 NEGATIVE  NEGATIVE mg/dL Final  . Urobilinogen, UA 10/30/2014 0.2  0.0 - 1.0 mg/dL Final  . Nitrite 10/30/2014 NEGATIVE  NEGATIVE Final  . Leukocytes, UA 10/30/2014 MODERATE* NEGATIVE Final  . Squamous Epithelial / LPF 10/30/2014 FEW* RARE Final  . WBC, UA 10/30/2014 11-20  <  3 WBC/hpf Final  . RBC / HPF 10/30/2014 0-2  <3 RBC/hpf Final  . Bacteria,  UA 10/30/2014 FEW* RARE Final  . Urine-Other 10/30/2014 AMORPHOUS URATES/PHOSPHATES   Final  . Sodium 10/31/2014 140  135 - 145 mmol/L Final  . Potassium 10/31/2014 4.2  3.5 - 5.1 mmol/L Final  . Chloride 10/31/2014 107  101 - 111 mmol/L Final  . CO2 10/31/2014 27  22 - 32 mmol/L Final  . Glucose, Bld 10/31/2014 149* 65 - 99 mg/dL Final  . BUN 10/31/2014 14  6 - 20 mg/dL Final  . Creatinine, Ser 10/31/2014 0.85  0.44 - 1.00 mg/dL Final  . Calcium 10/31/2014 8.6* 8.9 - 10.3 mg/dL Final  . GFR calc non Af Amer 10/31/2014 60* >60 mL/min Final  . GFR calc Af Amer 10/31/2014 >60  >60 mL/min Final   Comment: (NOTE) The eGFR has been calculated using the CKD EPI equation. This calculation has not been validated in all clinical situations. eGFR's persistently <60 mL/min signify possible Chronic Kidney Disease.   . Anion gap 10/31/2014 6  5 - 15 Final  . WBC 10/31/2014 5.2  4.0 - 10.5 K/uL Final  . RBC 10/31/2014 4.13  3.87 - 5.11 MIL/uL Final  . Hemoglobin 10/31/2014 12.7  12.0 - 15.0 g/dL Final  . HCT 10/31/2014 38.3  36.0 - 46.0 % Final  . MCV 10/31/2014 92.7  78.0 - 100.0 fL Final  . MCH 10/31/2014 30.8  26.0 - 34.0 pg Final  . MCHC 10/31/2014 33.2  30.0 - 36.0 g/dL Final  . RDW 10/31/2014 13.8  11.5 - 15.5 % Final  . Platelets 10/31/2014 169  150 - 400 K/uL Final  . Troponin I 10/31/2014 <0.03  <0.031 ng/mL Final   Comment:        NO INDICATION OF MYOCARDIAL INJURY.   . Troponin I 10/31/2014 <0.03  <0.031 ng/mL Final   Comment:        NO INDICATION OF MYOCARDIAL INJURY.   . Troponin I 10/31/2014 <0.03  <0.031 ng/mL Final   Comment:        NO INDICATION OF MYOCARDIAL INJURY.   Office Visit on 10/21/2014  Component Date Value Ref Range Status  . Color, UA 10/21/2014 Dark yellow   Final  . Glucose, UA 10/21/2014 neg   Final  . Bilirubin, UA 10/21/2014 neg   Final  . Ketones, UA 10/21/2014 neg   Final  . Spec Grav, UA 10/21/2014 1.025   Final   1.030  . Blood, UA 10/21/2014  pos   Final  . pH, UA 10/21/2014 6.0   Final  . Protein, UA 10/21/2014 1+   Final   0.3  . Urobilinogen, UA 10/21/2014 negative   Final  . Nitrite, UA 10/21/2014 neg   Final  . Leukocytes, UA 10/21/2014 large (3+)* Negative Final   500 leu  . Color, Urine 10/23/2014 YELLOW  Yellow;Lt. Yellow Final  . APPearance 10/23/2014 CLEAR  Clear Final  . Specific Gravity, Urine 10/23/2014 1.015  1.000-1.030 Final  . pH 10/23/2014 6.0  5.0 - 8.0 Final  . Total Protein, Urine 10/23/2014 NEGATIVE  Negative Final  . Urine Glucose 10/23/2014 NEGATIVE  Negative Final  . Ketones, ur 10/23/2014 NEGATIVE  Negative Final  . Bilirubin Urine 10/23/2014 NEGATIVE  Negative Final  . Hgb urine dipstick 10/23/2014 NEGATIVE  Negative Final  . Urobilinogen, UA 10/23/2014 0.2  0.0 - 1.0 Final  . Leukocytes, UA 10/23/2014 NEGATIVE  Negative Final  . Nitrite 10/23/2014 NEGATIVE  Negative Final  . Colony Count 10/23/2014 7,000 COLONIES/ML   Final  . Organism ID, Bacteria 10/23/2014 Insignificant Growth   Final  Clinical Support on 09/20/2014  Component Date Value Ref Range Status  . TB Skin Test 09/23/2014 Negative   Final  . Induration 09/23/2014 0   Final    No results found.   Assessment/Plan   ICD-9-CM ICD-10-CM   1. Urge incontinence of urine probably related to #3 788.31 N39.41 oxybutynin (DITROPAN) 5 MG tablet  2. Chronic atrial fibrillation - rate controlled 427.31 I48.2   3. Vascular dementia, without behavioral disturbance -stable 290.40 F01.50   4. Pain, joint, multiple sites - stable 719.49 M25.50   5. Osteoporosis - stable 733.00 M81.0   6. History of CVA (cerebrovascular accident)  V12.54 Z86.73    with left hemiparesis - stable  7. Need for prophylactic vaccination and inoculation against influenza V04.81 Z23 Flu Vaccine QUAD 36+ mos PF IM (Fluarix & Fluzone Quad PF)    --flu shot today  Drink at least 2 ensures daily  Take overactive bladder medicine twice daily  Follow up with  specialists as scheduled  Follow up in 1 month for [place]. Perlie Gold  [facility] [address] [place], [place] [address] [phone] Cell (Monday-Friday 8 AM - 5 PM) [phone] After 5 PM and follow prompts

## 2014-12-11 NOTE — Patient Instructions (Addendum)
Flu shot given today  Drink at least 2 ensures daily  Take overactive bladder medicine twice daily  Follow up with specialists as scheduled  Follow up in 1 month for CPE/medicare

## 2014-12-17 NOTE — Telephone Encounter (Signed)
error 

## 2014-12-18 ENCOUNTER — Emergency Department (HOSPITAL_COMMUNITY): Payer: Medicare Other

## 2014-12-18 ENCOUNTER — Encounter (HOSPITAL_COMMUNITY): Payer: Self-pay | Admitting: Emergency Medicine

## 2014-12-18 ENCOUNTER — Inpatient Hospital Stay (HOSPITAL_COMMUNITY)
Admission: EM | Admit: 2014-12-18 | Discharge: 2014-12-21 | DRG: 690 | Disposition: A | Discharge status: Skilled Nursing Facility | Payer: Medicare Other | Attending: Internal Medicine | Admitting: Internal Medicine

## 2014-12-18 ENCOUNTER — Observation Stay (HOSPITAL_COMMUNITY): Payer: Medicare Other

## 2014-12-18 ENCOUNTER — Encounter (HOSPITAL_COMMUNITY)
Admission: EM | Disposition: A | Discharge status: Skilled Nursing Facility | Payer: Self-pay | Source: Home / Self Care | Attending: Internal Medicine

## 2014-12-18 ENCOUNTER — Inpatient Hospital Stay (HOSPITAL_COMMUNITY): Payer: Medicare Other

## 2014-12-18 ENCOUNTER — Encounter (HOSPITAL_COMMUNITY): Payer: Self-pay | Admitting: Certified Registered"

## 2014-12-18 DIAGNOSIS — Z888 Allergy status to other drugs, medicaments and biological substances status: Secondary | ICD-10-CM | POA: Diagnosis not present

## 2014-12-18 DIAGNOSIS — Z86010 Personal history of colonic polyps: Secondary | ICD-10-CM

## 2014-12-18 DIAGNOSIS — R1032 Left lower quadrant pain: Secondary | ICD-10-CM | POA: Diagnosis not present

## 2014-12-18 DIAGNOSIS — I639 Cerebral infarction, unspecified: Secondary | ICD-10-CM

## 2014-12-18 DIAGNOSIS — R319 Hematuria, unspecified: Secondary | ICD-10-CM

## 2014-12-18 DIAGNOSIS — H9190 Unspecified hearing loss, unspecified ear: Secondary | ICD-10-CM | POA: Diagnosis present

## 2014-12-18 DIAGNOSIS — I10 Essential (primary) hypertension: Secondary | ICD-10-CM | POA: Diagnosis not present

## 2014-12-18 DIAGNOSIS — Z885 Allergy status to narcotic agent status: Secondary | ICD-10-CM | POA: Diagnosis not present

## 2014-12-18 DIAGNOSIS — R531 Weakness: Secondary | ICD-10-CM | POA: Diagnosis present

## 2014-12-18 DIAGNOSIS — R109 Unspecified abdominal pain: Secondary | ICD-10-CM

## 2014-12-18 DIAGNOSIS — I69354 Hemiplegia and hemiparesis following cerebral infarction affecting left non-dominant side: Secondary | ICD-10-CM | POA: Diagnosis not present

## 2014-12-18 DIAGNOSIS — I4892 Unspecified atrial flutter: Secondary | ICD-10-CM | POA: Diagnosis not present

## 2014-12-18 DIAGNOSIS — R110 Nausea: Secondary | ICD-10-CM

## 2014-12-18 DIAGNOSIS — G43909 Migraine, unspecified, not intractable, without status migrainosus: Secondary | ICD-10-CM | POA: Diagnosis present

## 2014-12-18 DIAGNOSIS — R41841 Cognitive communication deficit: Secondary | ICD-10-CM | POA: Diagnosis not present

## 2014-12-18 DIAGNOSIS — M6281 Muscle weakness (generalized): Secondary | ICD-10-CM | POA: Diagnosis not present

## 2014-12-18 DIAGNOSIS — I69854 Hemiplegia and hemiparesis following other cerebrovascular disease affecting left non-dominant side: Secondary | ICD-10-CM

## 2014-12-18 DIAGNOSIS — F0390 Unspecified dementia without behavioral disturbance: Secondary | ICD-10-CM | POA: Diagnosis present

## 2014-12-18 DIAGNOSIS — N390 Urinary tract infection, site not specified: Secondary | ICD-10-CM | POA: Diagnosis not present

## 2014-12-18 DIAGNOSIS — Z87891 Personal history of nicotine dependence: Secondary | ICD-10-CM | POA: Diagnosis not present

## 2014-12-18 DIAGNOSIS — Z7901 Long term (current) use of anticoagulants: Secondary | ICD-10-CM

## 2014-12-18 DIAGNOSIS — I6789 Other cerebrovascular disease: Secondary | ICD-10-CM | POA: Diagnosis not present

## 2014-12-18 DIAGNOSIS — Z8673 Personal history of transient ischemic attack (TIA), and cerebral infarction without residual deficits: Secondary | ICD-10-CM

## 2014-12-18 DIAGNOSIS — I4891 Unspecified atrial fibrillation: Secondary | ICD-10-CM | POA: Diagnosis present

## 2014-12-18 DIAGNOSIS — R0789 Other chest pain: Secondary | ICD-10-CM | POA: Diagnosis present

## 2014-12-18 DIAGNOSIS — R29810 Facial weakness: Secondary | ICD-10-CM | POA: Diagnosis not present

## 2014-12-18 DIAGNOSIS — R1312 Dysphagia, oropharyngeal phase: Secondary | ICD-10-CM | POA: Diagnosis not present

## 2014-12-18 DIAGNOSIS — M6289 Other specified disorders of muscle: Secondary | ICD-10-CM | POA: Diagnosis not present

## 2014-12-18 DIAGNOSIS — M810 Age-related osteoporosis without current pathological fracture: Secondary | ICD-10-CM | POA: Diagnosis present

## 2014-12-18 DIAGNOSIS — E860 Dehydration: Secondary | ICD-10-CM | POA: Diagnosis present

## 2014-12-18 DIAGNOSIS — Z79899 Other long term (current) drug therapy: Secondary | ICD-10-CM

## 2014-12-18 DIAGNOSIS — M1990 Unspecified osteoarthritis, unspecified site: Secondary | ICD-10-CM | POA: Diagnosis present

## 2014-12-18 DIAGNOSIS — R231 Pallor: Secondary | ICD-10-CM | POA: Diagnosis not present

## 2014-12-18 DIAGNOSIS — E780 Pure hypercholesterolemia: Secondary | ICD-10-CM | POA: Diagnosis present

## 2014-12-18 DIAGNOSIS — Z5189 Encounter for other specified aftercare: Secondary | ICD-10-CM | POA: Diagnosis not present

## 2014-12-18 DIAGNOSIS — Z823 Family history of stroke: Secondary | ICD-10-CM | POA: Diagnosis not present

## 2014-12-18 DIAGNOSIS — R260 Ataxic gait: Secondary | ICD-10-CM | POA: Diagnosis not present

## 2014-12-18 DIAGNOSIS — R279 Unspecified lack of coordination: Secondary | ICD-10-CM | POA: Diagnosis not present

## 2014-12-18 LAB — RAPID URINE DRUG SCREEN, HOSP PERFORMED
AMPHETAMINES: NOT DETECTED
BENZODIAZEPINES: NOT DETECTED
Barbiturates: NOT DETECTED
COCAINE: NOT DETECTED
OPIATES: NOT DETECTED
Tetrahydrocannabinol: NOT DETECTED

## 2014-12-18 LAB — DIFFERENTIAL
BASOS PCT: 1 %
Basophils Absolute: 0 10*3/uL (ref 0.0–0.1)
EOS ABS: 0.2 10*3/uL (ref 0.0–0.7)
Eosinophils Relative: 4 %
Lymphocytes Relative: 31 %
Lymphs Abs: 1.5 10*3/uL (ref 0.7–4.0)
MONO ABS: 0.9 10*3/uL (ref 0.1–1.0)
Monocytes Relative: 19 %
Neutro Abs: 2.2 10*3/uL (ref 1.7–7.7)
Neutrophils Relative %: 45 %

## 2014-12-18 LAB — URINALYSIS, ROUTINE W REFLEX MICROSCOPIC
Bilirubin Urine: NEGATIVE
GLUCOSE, UA: NEGATIVE mg/dL
Ketones, ur: NEGATIVE mg/dL
Nitrite: NEGATIVE
Protein, ur: NEGATIVE mg/dL
SPECIFIC GRAVITY, URINE: 1.012 (ref 1.005–1.030)
Urobilinogen, UA: 0.2 mg/dL (ref 0.0–1.0)
pH: 7.5 (ref 5.0–8.0)

## 2014-12-18 LAB — PROTIME-INR
INR: 1.12 (ref 0.00–1.49)
PROTHROMBIN TIME: 14.6 s (ref 11.6–15.2)

## 2014-12-18 LAB — COMPREHENSIVE METABOLIC PANEL
ALT: 9 U/L — ABNORMAL LOW (ref 14–54)
AST: 23 U/L (ref 15–41)
Albumin: 3.3 g/dL — ABNORMAL LOW (ref 3.5–5.0)
Alkaline Phosphatase: 55 U/L (ref 38–126)
Anion gap: 9 (ref 5–15)
BUN: 20 mg/dL (ref 6–20)
CHLORIDE: 104 mmol/L (ref 101–111)
CO2: 27 mmol/L (ref 22–32)
Calcium: 9.2 mg/dL (ref 8.9–10.3)
Creatinine, Ser: 1.03 mg/dL — ABNORMAL HIGH (ref 0.44–1.00)
GFR, EST AFRICAN AMERICAN: 55 mL/min — AB (ref >60)
GFR, EST NON AFRICAN AMERICAN: 47 mL/min — AB (ref >60)
Glucose, Bld: 99 mg/dL (ref 65–99)
POTASSIUM: 4.5 mmol/L (ref 3.5–5.1)
Sodium: 140 mmol/L (ref 135–145)
Total Bilirubin: 0.4 mg/dL (ref 0.3–1.2)
Total Protein: 5.7 g/dL — ABNORMAL LOW (ref 6.5–8.1)

## 2014-12-18 LAB — CBC
HCT: 40.1 % (ref 36.0–46.0)
Hemoglobin: 12.9 g/dL (ref 12.0–15.0)
MCH: 30.3 pg (ref 26.0–34.0)
MCHC: 32.2 g/dL (ref 30.0–36.0)
MCV: 94.1 fL (ref 78.0–100.0)
Platelets: 173 10*3/uL (ref 150–400)
RBC: 4.26 MIL/uL (ref 3.87–5.11)
RDW: 14.3 % (ref 11.5–15.5)
WBC: 4.9 10*3/uL (ref 4.0–10.5)

## 2014-12-18 LAB — VALPROIC ACID LEVEL: VALPROIC ACID LVL: 32 ug/mL — AB (ref 50.0–100.0)

## 2014-12-18 LAB — I-STAT CHEM 8, ED
BUN: 22 mg/dL — ABNORMAL HIGH (ref 6–20)
CALCIUM ION: 1.15 mmol/L (ref 1.13–1.30)
CHLORIDE: 103 mmol/L (ref 101–111)
Creatinine, Ser: 1 mg/dL (ref 0.44–1.00)
Glucose, Bld: 98 mg/dL (ref 65–99)
HCT: 40 % (ref 36.0–46.0)
Hemoglobin: 13.6 g/dL (ref 12.0–15.0)
POTASSIUM: 4.4 mmol/L (ref 3.5–5.1)
SODIUM: 141 mmol/L (ref 135–145)
TCO2: 27 mmol/L (ref 0–100)

## 2014-12-18 LAB — APTT: APTT: 28 s (ref 24–37)

## 2014-12-18 LAB — I-STAT TROPONIN, ED: TROPONIN I, POC: 0.02 ng/mL (ref 0.00–0.08)

## 2014-12-18 LAB — ETHANOL

## 2014-12-18 LAB — URINE MICROSCOPIC-ADD ON

## 2014-12-18 SURGERY — RADIOLOGY WITH ANESTHESIA
Anesthesia: Choice

## 2014-12-18 MED ORDER — HYDRALAZINE HCL 20 MG/ML IJ SOLN: 10.0000 mg | Freq: Four times a day (QID) | INTRAMUSCULAR | Status: DC | PRN

## 2014-12-18 MED ORDER — DEXTROSE 5 % IV SOLN
1.0000 g | INTRAVENOUS | Status: DC
  Administered 2014-12-19 – 2014-12-21 (×3): 1 g via INTRAVENOUS
  Filled 2014-12-18 (×3): qty 10

## 2014-12-18 MED ORDER — STROKE: EARLY STAGES OF RECOVERY BOOK
Freq: Once | Status: AC
  Administered 2014-12-18: 21:00:00

## 2014-12-18 MED ORDER — HYDROCODONE-ACETAMINOPHEN 5-325 MG PO TABS
1.0000 | ORAL_TABLET | ORAL | Status: DC | PRN
  Administered 2014-12-18 (×2): 1 via ORAL
  Filled 2014-12-18 (×2): qty 1

## 2014-12-18 MED ORDER — IOHEXOL 300 MG/ML  SOLN
100.0000 mL | Freq: Once | INTRAMUSCULAR | Status: AC | PRN
  Administered 2014-12-18: 100 mL via INTRAVENOUS

## 2014-12-18 MED ORDER — DIVALPROEX SODIUM 250 MG PO DR TAB
250.0000 mg | DELAYED_RELEASE_TABLET | Freq: Two times a day (BID) | ORAL | Status: DC
  Administered 2014-12-18 – 2014-12-21 (×7): 250 mg via ORAL
  Filled 2014-12-18 (×11): qty 1

## 2014-12-18 MED ORDER — APIXABAN 2.5 MG PO TABS
2.5000 mg | ORAL_TABLET | Freq: Two times a day (BID) | ORAL | Status: DC
  Administered 2014-12-18 – 2014-12-21 (×7): 2.5 mg via ORAL
  Filled 2014-12-18 (×7): qty 1

## 2014-12-18 MED ORDER — DIPHENHYDRAMINE HCL 25 MG PO CAPS
25.0000 mg | ORAL_CAPSULE | Freq: Every evening | ORAL | Status: DC | PRN
  Administered 2014-12-20: 25 mg via ORAL
  Filled 2014-12-18: qty 1

## 2014-12-18 MED ORDER — ONDANSETRON HCL 4 MG/2ML IJ SOLN
4.0000 mg | Freq: Once | INTRAMUSCULAR | Status: AC
  Administered 2014-12-18: 4 mg via INTRAVENOUS
  Filled 2014-12-18: qty 2

## 2014-12-18 MED ORDER — SENNOSIDES-DOCUSATE SODIUM 8.6-50 MG PO TABS: 1.0000 | ORAL_TABLET | Freq: Every evening | ORAL | Status: DC | PRN

## 2014-12-18 MED ORDER — DIPHENHYDRAMINE-APAP (SLEEP) 25-500 MG PO TABS: 1.0000 | ORAL_TABLET | Freq: Every evening | ORAL | Status: DC | PRN

## 2014-12-18 MED ORDER — DEXTROSE 5 % IV SOLN
1.0000 g | Freq: Once | INTRAVENOUS | Status: AC
  Administered 2014-12-18: 1 g via INTRAVENOUS
  Filled 2014-12-18: qty 10

## 2014-12-18 MED ORDER — ACETAMINOPHEN 500 MG PO TABS
1000.0000 mg | ORAL_TABLET | Freq: Three times a day (TID) | ORAL | Status: DC | PRN
Start: 2014-12-18 — End: 2014-12-21
  Administered 2014-12-19 – 2014-12-21 (×3): 1000 mg via ORAL
  Filled 2014-12-18 (×3): qty 2

## 2014-12-18 MED ORDER — SODIUM CHLORIDE 0.9 % IJ SOLN
INTRAMUSCULAR | Status: AC
  Filled 2014-12-18: qty 2500

## 2014-12-18 MED ORDER — IOHEXOL 300 MG/ML  SOLN
25.0000 mL | INTRAMUSCULAR | Status: AC
  Administered 2014-12-18 (×2): 25 mL via ORAL

## 2014-12-18 MED ORDER — DOCUSATE SODIUM 100 MG PO CAPS
100.0000 mg | ORAL_CAPSULE | Freq: Every day | ORAL | Status: DC
  Administered 2014-12-18 – 2014-12-21 (×4): 100 mg via ORAL
  Filled 2014-12-18 (×4): qty 1

## 2014-12-18 MED ORDER — ACETAMINOPHEN 500 MG PO TABS: 500.0000 mg | ORAL_TABLET | Freq: Every evening | ORAL | Status: DC | PRN

## 2014-12-18 MED ORDER — MORPHINE SULFATE (PF) 2 MG/ML IV SOLN: 1.0000 mg | INTRAVENOUS | Status: DC | PRN

## 2014-12-18 MED ORDER — ONDANSETRON HCL 4 MG/2ML IJ SOLN: 4.0000 mg | Freq: Four times a day (QID) | INTRAMUSCULAR | Status: DC | PRN

## 2014-12-18 MED ORDER — METOCLOPRAMIDE HCL 5 MG/ML IJ SOLN
10.0000 mg | Freq: Once | INTRAMUSCULAR | Status: AC
Start: 2014-12-18 — End: 2014-12-18
  Administered 2014-12-18: 10 mg via INTRAVENOUS
  Filled 2014-12-18: qty 2

## 2014-12-18 MED ORDER — OXYBUTYNIN CHLORIDE 5 MG PO TABS
5.0000 mg | ORAL_TABLET | Freq: Two times a day (BID) | ORAL | Status: DC
  Administered 2014-12-18 – 2014-12-21 (×7): 5 mg via ORAL
  Filled 2014-12-18 (×7): qty 1

## 2014-12-18 MED ORDER — SODIUM CHLORIDE 0.9 % IV SOLN
INTRAVENOUS | Status: DC
  Administered 2014-12-18: 21:00:00 via INTRAVENOUS

## 2014-12-18 NOTE — Consult Note (Addendum)
Referring Physician: ED    Chief Complaint: code stroke, worsening left hemiparesis, difficulty walking  HPI:                                                                                                                                         [name] is an 81 y.o. female with a past medical history that is relevant for HTN, hyperlipidemia, atrial fibrillation on apixaban, right hemispheric subcortical infarct in May 2014 with recent admission in May 2015 with new right corpus callosal embolic infarct with residual left hemiparesis, MCI amnestic type, brought in by EMS as a code stroke due to acute onset of the above stated symptoms. She is followed in the outpatient neurology setting by Dr Leonie Man, with last visit 10/29/14, and I reviewed the available neuro notes. Patient lives at home with her daughters and at baseline has chronic mild left sided weakness but able to ambulate with minimal assistance. Family called EMS because around [place] last night she was noted to have increasing weakness of the side side and had difficulty ambulating which almost make her fall. Patient denies HA, vertigo, double vision, difficulty swallowing, slurred speech, language or visual impairment. NIHSS 1 for left leg drift. CT brain was personally reviewed and showed no acute abnormality.  Date last known well: 12/17/14 Time last known well: 1950 tPA Given: no, late presentation, apixaban, minimal deficits NIHSS: 1 MRS: 3  Past Medical History  Diagnosis Date  . Arthritis   . Hypertension   . Migraine   . Colon polyp   . Hearing difficulty   . Torus palatinus   . Atrial flutter January, 2012  . Stroke 08/02/12     right lenticular nucleus infarct  . Left leg weakness 02/05/2013  . Chronic anticoagulation   . High cholesterol   . Atrial fibrillation     on Eliquis Rx  . Mild cognitive impairment with memory loss   . Malnutrition   . TIA (transient ischemic attack)   . Constipation   . Osteoporosis    . Hemorrhoids     Past Surgical History  Procedure Laterality Date  . Cataract extraction w/ intraocular lens  implant, bilateral  2006-2008  . Ganglion cyst excision Bilateral 1938,1954,2003,2005    "wrists/hand" (08/01/2012)  . Cardioversion  05/19/2010    Dr. Einar Gip  . Tonsillectomy  ~ 1935  . Appendectomy  02/19/53    `  . Mouth surgery      [name]    Family History  Problem Relation Age of Onset  . Colon cancer Father   . Breast cancer    . Brain cancer    . Cancer Mother     breast  . Aneurysm Mother     brain  . Heart attack Neg Hx   . Diabetes Neg Hx   . Hypertension Neg Hx   . Stroke Mother  Social History:  reports that she has quit smoking. She has never used smokeless tobacco. She reports that she does not drink alcohol or use illicit drugs. Family history: no epilepsy, brain tumor, or brain aneurysm Allergies:  Allergies  Allergen Reactions  . Aricept [Donepezil Hcl] Hypertension    With N/V  . Tramadol Hcl Other (See Comments)     lethargy, nausea  . Alendronate Sodium Rash and Other (See Comments)    puffy eyes    Medications:                                                                                                                           I have reviewed the patient's current medications.  ROS:                                                                                                                                       History obtained from chart review and the patient  General ROS: negative for - chills, fatigue, fever, night sweats, weight gain or weight loss. There were no vitals taken for this visit. Psychological ROS: negative for - behavioral disorder, hallucinations, mood swings or suicidal ideation Ophthalmic ROS: negative for - blurry vision, double vision, eye pain or loss of vision ENT ROS: negative for - epistaxis, nasal discharge, oral lesions, sore throat, tinnitus or vertigo Allergy and Immunology ROS: negative for -  hives or itchy/watery eyes Hematological and Lymphatic ROS: negative for - bleeding problems, bruising or swollen lymph nodes Endocrine ROS: negative for - galactorrhea, hair pattern changes, polydipsia/polyuria or temperature intolerance Respiratory ROS: negative for - cough, hemoptysis, shortness of breath or wheezing Cardiovascular ROS: negative for - chest pain, dyspnea on exertion, edema or irregular heartbeat Gastrointestinal ROS: negative for - abdominal pain, diarrhea, hematemesis, nausea/vomiting or stool incontinence Genito-Urinary ROS: negative for - dysuria, hematuria, incontinence or urinary frequency/urgency Musculoskeletal ROS: negative for - joint swelling  Neurological ROS: as noted in HPI Dermatological ROS: negative for rash and skin lesion changes  Neurologic Examination:  General: Mental Status: Alert, oriented, thought content appropriate.  Speech fluent without evidence of aphasia.  Able to follow 3 step commands without difficulty. Cranial Nerves: II: Discs flat bilaterally; Visual fields grossly normal, pupils equal, round, reactive to light and accommodation III,IV, VI: ptosis not present, extra-ocular motions intact bilaterally V,VII: smile symmetric, facial light touch sensation normal bilaterally VIII: hearing normal bilaterally IX,X: uvula rises symmetrically XI: bilateral shoulder shrug XII: midline tongue extension without atrophy or fasciculations Motor: Mild left leg drift. Tone and bulk:normal tone throughout; no atrophy noted Sensory: Pinprick and light touch intact throughout, bilaterally Deep Tendon Reflexes:  1 all over Plantars: Right: downgoing   Left: downgoing Cerebellar: normal finger-to-nose,  normal heel-to-shin test Gait: No tested due to multiple leads    Results for orders placed or performed during the hospital encounter of  12/18/14 (from the past 48 hour(s))  I-stat troponin, ED (not at [facility], [facility])     Status: None   Collection Time: 12/18/14  1:50 AM  Result Value Ref Range   Troponin i, poc 0.02 0.00 - 0.08 ng/mL   Comment 3            Comment: Due to the release kinetics of cTnI, a negative result within the first hours of the onset of symptoms does not rule out myocardial infarction with certainty. If myocardial infarction is still suspected, repeat the test at appropriate intervals.   I-Stat Chem 8, ED  (not at [facility], [facility])     Status: Abnormal   Collection Time: 12/18/14  1:51 AM  Result Value Ref Range   Sodium 141 135 - 145 mmol/L   Potassium 4.4 3.5 - 5.1 mmol/L   Chloride 103 101 - 111 mmol/L   BUN 22 (H) 6 - 20 mg/dL   Creatinine, Ser 1.00 0.44 - 1.00 mg/dL   Glucose, Bld 98 65 - 99 mg/dL   Calcium, Ion 1.15 1.13 - 1.30 mmol/L   TCO2 27 0 - 100 mmol/L   Hemoglobin 13.6 12.0 - 15.0 g/dL   HCT 40.0 36.0 - 46.0 %   No results found.   Assessment: 81 y.o. female brought in as a code stroke due to worsening left hemiparesis and difficulty ambulating. NIHSS 1 for left LE drift, CT brain without acute abnormality. Patient is on apixaban, minimal deficits, and out of the window for thrombolysis. Multiple risk factors for stroke, thus can not exclude a new right brain infarct. However, she had had multiple admissions with worsening of hemiparesis in the setting of infection or dehydration, and therefore medical causes for worsening hemiparesis should be investigated. Recommend MRI/MRA brain and further stroke work up if MRI + for stroke. Continue apixaban for now. Stroke team will resume care tomorrow.   Stroke Risk Factors - age,  HTN, hyperlipidemia, atrial fibrillation, prior stroke  Plan: 1. HgbA1c, fasting lipid panel 2. MRI, MRA  of the brain without contrast 3. Echocardiogram 4. Carotid dopplers 5. Prophylactic therapy-apixaban 6. Risk factor modification 7. Telemetry monitoring 8.  Frequent neuro checks 9. PT/OT SLP   Dorian Pod, MD [facility] [phone]  12/18/2014, 2:02 AM

## 2014-12-18 NOTE — ED Notes (Signed)
Per EMS, pt's family stated that pt was weaker on left side, more than usual. Pt with history of left sided weakness from prior strokes. Pt arrived to ER, CAO, stable vital signs, and in no pain. Pt with no slurred speech or headaches. Pt with EMS blood sugar of 109.

## 2014-12-18 NOTE — Code Documentation (Signed)
[name] presented to the [facility] via [facility] after having increasing Lt weakness.  She has Lt side weakness from a previous stroke, but at 1930 this evening stumbled coming from the bathroom and noticed she was weaker than normal.  NIH 1 for LLE drift.

## 2014-12-18 NOTE — Progress Notes (Signed)
Stroke Team Progress Note  HISTORY [name] is an 81 y.o. female with a past medical history that is relevant for HTN, hyperlipidemia, atrial fibrillation on apixaban, right hemispheric subcortical infarct in May 2014 with recent admission in May 2015 with new right corpus callosal embolic infarct with residual left hemiparesis, MCI amnestic type, brought in by EMS as a code stroke due to acute onset of the above stated symptoms. She is followed in the outpatient neurology setting by Dr Leonie Man, with last visit 10/29/14, and I reviewed the available neuro notes. Patient lives at home with her daughters and at baseline has chronic mild left sided weakness but able to ambulate with minimal assistance. Family called EMS because around [place] last night she was noted to have increasing weakness of the side side and had difficulty ambulating which almost make her fall. Patient denies HA, vertigo, double vision, difficulty swallowing, slurred speech, language or visual impairment. NIHSS 1 for left leg drift. CT brain was personally reviewed and showed no acute abnormality. Date last known well: 12/17/14 Time last known well: 1950 tPA Given: no, late presentation, apixaban, minimal deficits NIHSS: 1 MRS: 3    She was admitted to the neuro floor bed for further evaluation and treatment.  SUBJECTIVE Her daughter is at the bedside.  Overall she feels her condition is gradually improving.  I saw her in the office a month ago and started on Aricept for cognitive impairment but she did not tolerate it due to GI side effects.  OBJECTIVE Most recent Vital Signs: Filed Vitals:   12/18/14 0330 12/18/14 0543 12/18/14 1018 12/18/14 1420  BP: 182/88 168/73 146/79 103/55  Pulse: 69 55 95 103  Temp:   98.4 F (36.9 C) 98.1 F (36.7 C)  TempSrc:   Oral Oral  Resp: 22 16 17 20   Height:  5\' 6"  (1.676 m)    Weight:  131 lb 12.8 oz (59.784 kg)    SpO2: 95% 96% 95% 94%   CBG (last 3)  No results for input(s):  GLUCAP in the last 72 hours.  IV Fluid Intake:     MEDICATIONS  . apixaban  2.5 mg Oral BID  . [START ON 12/19/2014] cefTRIAXone (ROCEPHIN)  IV  1 g Intravenous Q24H  . divalproex  250 mg Oral BID  . docusate sodium  100 mg Oral Daily  . iohexol  25 mL Oral Q1 Hr x 2  . oxybutynin  5 mg Oral BID   PRN:  acetaminophen, diphenhydrAMINE **AND** acetaminophen, hydrALAZINE, HYDROcodone-acetaminophen, morphine injection, ondansetron (ZOFRAN) IV  Diet:  Diet Heart Room service appropriate?: Yes; Fluid consistency:: Thin  liquids Activity:  Bedrest    DVT Prophylaxis:  apixaban  CLINICALLY SIGNIFICANT STUDIES Basic Metabolic Panel:  Recent Labs Lab 12/18/14 0144 12/18/14 0151  NA 140 141  K 4.5 4.4  CL 104 103  CO2 27  --   GLUCOSE 99 98  BUN 20 22*  CREATININE 1.03* 1.00  CALCIUM 9.2  --    Liver Function Tests:  Recent Labs Lab 12/18/14 0144  AST 23  ALT 9*  ALKPHOS 55  BILITOT 0.4  PROT 5.7*  ALBUMIN 3.3*   CBC:  Recent Labs Lab 12/18/14 0144 12/18/14 0151  WBC 4.9  --   NEUTROABS 2.2  --   HGB 12.9 13.6  HCT 40.1 40.0  MCV 94.1  --   PLT 173  --    Coagulation:  Recent Labs Lab 12/18/14 0144  LABPROT 14.6  INR 1.12  Cardiac Enzymes: No results for input(s): CKTOTAL, CKMB, CKMBINDEX, TROPONINI in the last 168 hours. Urinalysis:  Recent Labs Lab 12/18/14 0225  COLORURINE YELLOW  LABSPEC 1.012  PHURINE 7.5  GLUCOSEU NEGATIVE  HGBUR SMALL*  BILIRUBINUR NEGATIVE  KETONESUR NEGATIVE  PROTEINUR NEGATIVE  UROBILINOGEN 0.2  NITRITE NEGATIVE  LEUKOCYTESUR LARGE*   Lipid Panel    Component Value Date/Time   CHOL 233* 06/24/2014 1132   TRIG 96.0 06/24/2014 1132   HDL 81.70 06/24/2014 1132   CHOLHDL 3 06/24/2014 1132   VLDL 19.2 06/24/2014 1132   LDLCALC 132* 06/24/2014 1132   HgbA1C  Lab Results  Component Value Date   HGBA1C 5.8* 08/28/2013    Urine Drug Screen:     Component Value Date/Time   LABOPIA NONE DETECTED 12/18/2014 0225    COCAINSCRNUR NONE DETECTED 12/18/2014 0225   LABBENZ NONE DETECTED 12/18/2014 0225   AMPHETMU NONE DETECTED 12/18/2014 0225   THCU NONE DETECTED 12/18/2014 0225   LABBARB NONE DETECTED 12/18/2014 0225    Alcohol Level:  Recent Labs Lab 12/18/14 0144  ETH <5    Ct Head Wo Contrast  12/18/2014   CLINICAL DATA:  Code stroke, left-sided weakness, history of strokes, hypertension, atrial fibrillation, former smoker  EXAM: CT HEAD WITHOUT CONTRAST  TECHNIQUE: Contiguous axial images were obtained from the base of the skull through the vertex without intravenous contrast.  COMPARISON:  08/24/2013  FINDINGS: Generalized atrophy.  Normal ventricular morphology.  No midline shift or mass effect.  Extensive small vessel chronic ischemic changes of deep cerebral white matter.  Old lacunar infarct RIGHT basal ganglia.  Old cortical infarcts RIGHT frontal and LEFT posterior parietal.  No intracranial hemorrhage, mass lesion or evidence acute infarction.  No extra-axial fluid collections.  Atherosclerotic calcifications at the carotid siphons.  Bones and sinuses grossly unremarkable.  IMPRESSION: Old infarcts RIGHT basal ganglia, RIGHT frontal and LEFT posterior parietal.  Atrophy with small vessel chronic ischemic changes of deep cerebral white matter.  No acute intracranial abnormalities.  Critical Value/emergent results were called by telephone at the time of interpretation on 12/18/2014 at 0206 hr to Dr. [name], who verbally acknowledged these results.   Electronically Signed   By: Lavonia Dana M.D.   On: 12/18/2014 02:07   Mr [name] Head Wo Contrast  12/18/2014   CLINICAL DATA:  Increasing LEFT-sided weakness beginning yesterday, near fall. History of LEFT-sided hemi paresis, hypertension, atrial fibrillation, migraine, mild cognitive impairment.  EXAM: MRI HEAD WITHOUT CONTRAST  MRA HEAD WITHOUT CONTRAST  TECHNIQUE: Multiplanar, multiecho pulse sequences of the brain and surrounding structures were obtained  without intravenous contrast. Angiographic images of the head were obtained using MRA technique without contrast.  COMPARISON:  CT head December 18, 2014 Set 1:51 a.m. and MRI of the head Aug 24, 2013  FINDINGS: MRI HEAD FINDINGS  No reduced diffusion to suggest acute ischemia. No susceptibility artifact to suggest hemorrhage. LEFT inferior parietal lobe encephalomalacia. RIGHT mesial frontal lobe encephalomalacia. Old cystic RIGHT basal ganglia lacunar infarct with mild ex vacuo dilatation of subjacent ventricle. Moderate to severe ventriculomegaly on the basis of global parenchymal brain volume loss. Old small RIGHT cerebellar infarcts. Confluent supratentorial white matter T2 hyperintense signal compatible with chronic small vessel ischemic disease without midline shift, mass effect or mass lesions.  No abnormal extra-axial fluid collections. Status post bilateral ocular lens implants. The paranasal sinuses and mastoid air cells are well aerated. No abnormal sellar expansion. No cerebellar tonsillar ectopia. No suspicious calvarial bone marrow signal. Generalized  bright T1 bone marrow signal compatible with osteopenia.  MRA HEAD FINDINGS  Anterior circulation: Normal flow related enhancement of the included cervical, petrous, cavernous and supra clinoid internal carotid arteries. Patent anterior communicating artery. Normal flow related enhancement of the anterior and middle cerebral arteries, including more distal segments. Moderate luminal regularity of the mid to distal MCA branches, similar to prior imaging, high-grade stenosis bilateral distal A2 segments, similar to progressed.  No large vessel occlusion, aneurysm.  Posterior circulation: Codominant vertebral arteries. Basilar artery is patent, with normal flow related enhancement of the main branch vessels. Normal flow related enhancement of the posterior cerebral arteries. Moderate stenosis LEFT distal P2 segment, unchanged.  No large vessel occlusion,  abnormal luminal irregularity, aneurysm.  IMPRESSION: MRI HEAD: No acute intracranial process.  Chronic changes including: Old RIGHT ACA territory infarct. Old LEFT MCA versus watershed parietal infarct. Old RIGHT basal ganglia lacunar infarct.  MRA HEAD: No acute large vessel occlusion.  High-grade stenosis bilateral distal A2 segments, moderate stenosis LEFT P2 segment. Moderate luminal regularity of the mid to distal MCA branches; findings are compatible with intracranial atherosclerosis.   Electronically Signed   By: Elon Alas M.D.   On: 12/18/2014 04:52   Mr Brain Wo Contrast  12/18/2014   CLINICAL DATA:  Increasing LEFT-sided weakness beginning yesterday, near fall. History of LEFT-sided hemi paresis, hypertension, atrial fibrillation, migraine, mild cognitive impairment.  EXAM: MRI HEAD WITHOUT CONTRAST  MRA HEAD WITHOUT CONTRAST  TECHNIQUE: Multiplanar, multiecho pulse sequences of the brain and surrounding structures were obtained without intravenous contrast. Angiographic images of the head were obtained using MRA technique without contrast.  COMPARISON:  CT head December 18, 2014 Set 1:51 a.m. and MRI of the head Aug 24, 2013  FINDINGS: MRI HEAD FINDINGS  No reduced diffusion to suggest acute ischemia. No susceptibility artifact to suggest hemorrhage. LEFT inferior parietal lobe encephalomalacia. RIGHT mesial frontal lobe encephalomalacia. Old cystic RIGHT basal ganglia lacunar infarct with mild ex vacuo dilatation of subjacent ventricle. Moderate to severe ventriculomegaly on the basis of global parenchymal brain volume loss. Old small RIGHT cerebellar infarcts. Confluent supratentorial white matter T2 hyperintense signal compatible with chronic small vessel ischemic disease without midline shift, mass effect or mass lesions.  No abnormal extra-axial fluid collections. Status post bilateral ocular lens implants. The paranasal sinuses and mastoid air cells are well aerated. No abnormal sellar  expansion. No cerebellar tonsillar ectopia. No suspicious calvarial bone marrow signal. Generalized bright T1 bone marrow signal compatible with osteopenia.  MRA HEAD FINDINGS  Anterior circulation: Normal flow related enhancement of the included cervical, petrous, cavernous and supra clinoid internal carotid arteries. Patent anterior communicating artery. Normal flow related enhancement of the anterior and middle cerebral arteries, including more distal segments. Moderate luminal regularity of the mid to distal MCA branches, similar to prior imaging, high-grade stenosis bilateral distal A2 segments, similar to progressed.  No large vessel occlusion, aneurysm.  Posterior circulation: Codominant vertebral arteries. Basilar artery is patent, with normal flow related enhancement of the main branch vessels. Normal flow related enhancement of the posterior cerebral arteries. Moderate stenosis LEFT distal P2 segment, unchanged.  No large vessel occlusion, abnormal luminal irregularity, aneurysm.  IMPRESSION: MRI HEAD: No acute intracranial process.  Chronic changes including: Old RIGHT ACA territory infarct. Old LEFT MCA versus watershed parietal infarct. Old RIGHT basal ganglia lacunar infarct.  MRA HEAD: No acute large vessel occlusion.  High-grade stenosis bilateral distal A2 segments, moderate stenosis LEFT P2 segment. Moderate luminal regularity of the mid  to distal MCA branches; findings are compatible with intracranial atherosclerosis.   Electronically Signed   By: Elon Alas M.D.   On: 12/18/2014 04:52   Dg Abd Portable 1v  12/18/2014   CLINICAL DATA:  Abdominal pain  EXAM: PORTABLE ABDOMEN - 1 VIEW  COMPARISON:  CT urogram of 11/22/2013  FINDINGS: A supine film of the abdomen shows no bowel obstruction. No radiographic evidence of constipation is seen. No opaque calculi are noted. There are degenerative changes in the lower lumbar spine noted.  IMPRESSION: No bowel obstruction. Degenerative change is  present in the lower lumbar spine.   Electronically Signed   By: Ivar Drape M.D.   On: 12/18/2014 12:59       Carotid Doppler  pending  2D Echocardiogram   Not ordered  CXR  10/30/14 Hyperinflation and cardiomegaly, unchanged. No acute cardiopulmonary findings  EKG  Atrial flutter Anteroseptal infarct, old No significant change since last tracing Therapy Recommendations pending  Physical Exam   Frail elderly Caucasian lady currently not in distress. . Afebrile. Head is nontraumatic. Neck is supple without bruit.    Cardiac exam no murmur or gallop. Lungs are clear to auscultation. Distal pulses are well felt. Neurological Exam ;  Mental Status: Alert, oriented, thought content appropriate. Speech fluent without evidence of aphasia. Able to follow 3 step commands without difficulty. Cranial Nerves: II: Discs flat bilaterally; Visual fields grossly normal, pupils equal, round, reactive to light and accommodation III,IV, VI: ptosis not present, extra-ocular motions intact bilaterally V,VII: smile symmetric, facial light touch sensation normal bilaterally VIII: hearing normal bilaterally IX,X: uvula rises symmetrically XI: bilateral shoulder shrug XII: midline tongue extension without atrophy or fasciculations Motor: Mild left leg drift. Tone and bulk:normal tone throughout; no atrophy noted Sensory: Pinprick and light touch intact throughout, bilaterally Deep Tendon Reflexes:  1 all over Plantars: Right: downgoingLeft: downgoing Cerebellar: normal finger-to-nose, normal heel-to-shin test Gait: Not tested ASSESSMENT [name] is a 81 y.o. female presenting with  worsening of left hemiparesis and difficulty walking of unclear etiology. MRI scan shows no acute infarct. On apixaban prior to admission. Now on apixaban for secondary stroke prevention. Patient with resultant  mild old left hemiparesis which is unchanged from baseline. Stroke  work up underway.   Chronic atrial flutter on long-term anticoagulation with Apixaban  LDL  132   Mild cognitive impairment   Hospital day #   TREATMENT/PLAN She has presented with increasing left-sided weakness and remains at risk for neurological worsening, recurrent stroke, TIA and needs ongoing evaluation and aggressive risk factor modification. Continue Band for secondary stroke prevention given atrial fibrillation flutter. Discussed with patient and daughter and answered questions..I have personally examined this patient, reviewed notes, independently viewed imaging studies, participated in medical decision making and plan of care. I have made any additions or clarifications directly to the above note. Agree with note above.    Antony Contras, MD Medical Director [facility] Pager: [phone] 12/18/2014 5:52 PM    SIGNED    To contact Stroke Continuity provider, please refer to [email or web address] After hours, contact General Neurology

## 2014-12-18 NOTE — H&P (Signed)
[facility] History and Physical                                                                                    [name], is a 81 y.o. female  MRN: [id number]   DOB - 1924-09-20  Admit Date - 12/18/2014  Outpatient Primary MD for the patient is [name], DO  Referring Physician:  Pryor Curia, MD  Chief Complaint:   Chief Complaint  Patient presents with  . Code Stroke     HPI  [name]  is a 81 y.o. female, with atrial fibrillation and CVA (2014 and 2015) on Eliquis. She was brought to ED during the night with worsening left sided weakness. Neurology has evaluated and ntoed that patient had had multiple admissions for worsening hemparesis in setting of infection or dehyration. Her brain CTscan is negative for acute abnormalities. MRI/ MRA of brain also negative for acute abnormalities.   Patient lives with her 3 daughters. Patient is hard of hearing, two daughters provide the history. During the night patient had difficulty getting up and down off toilet. Last seen normal is unknown. She was exhibiting more left-sided weakness than normal. No slurred speech or disorientation. She started Ditropan a week ago. Only other new med is Aricept started a few weeks back but stopped secondary to nausea. At home patient had no GI complaints but per daughters since arriving to ED patient has developed abdominal pain, nausea and heaves.   In ED her CBC is normal. Electrolytes and glucose normal. She is afebrile. BP elevated SBP 150-190's / 80-90's. Urine + for large leukocytes  Review of Systems   In addition to the HPI above,  No Fever-chills, No Headache, No changes with Vision or hearing, No problems swallowing food or Liquids, No Chest pain, Cough or Shortness of Breath, No Abdominal pain, No Nausea or Vomiting, Bowel movements are regular, No Blood in stool or Urine, No dysuria, No new skin rashes or bruises, No new joints pains-aches,  No tingling, numbness in any  extremity, No recent weight gain or loss, A full 10 point Review of Systems was done, except as stated above, all other Review of Systems were negative.  Past Medical History  Past Medical History  Diagnosis Date  . Arthritis   . Hypertension   . Migraine   . Colon polyp   . Hearing difficulty   . Torus palatinus   . Atrial flutter January, 2012  . Stroke 08/02/12     right lenticular nucleus infarct  . Left leg weakness 02/05/2013  . Chronic anticoagulation   . High cholesterol   . Atrial fibrillation     on Eliquis Rx  . Mild cognitive impairment with memory loss   . Malnutrition   . TIA (transient ischemic attack)   . Osteoporosis   . Hemorrhoids     Past Surgical History  Procedure Laterality Date  . Cataract extraction w/ intraocular lens  implant, bilateral  2006-2008  . Ganglion cyst excision Bilateral 1938,1954,2003,2005    "wrists/hand" (08/01/2012)  . Cardioversion  05/19/2010    Dr. Einar Gip  . Tonsillectomy  ~ 1935  . Appendectomy  02/19/53    `  .  Mouth surgery      [name]    Social History Social History  Substance Use Topics  . Smoking status: Former Research scientist (life sciences)  . Smokeless tobacco: Never Used  . Alcohol Use: No     Comment: 08/01/2012 "glass of wine on special occasions"   Resides at: home Lives with: 3 daughters Ambulatory status: has a walker but still needs assistance to get around  Family History Family History  Problem Relation Age of Onset  . Colon cancer Father   . Breast cancer    . Brain cancer    . Cancer Mother     breast  . Aneurysm Mother     brain  . Heart attack Neg Hx   . Diabetes Neg Hx   . Hypertension Neg Hx   . Stroke Mother     Prior to Admission medications   Medication Sig Start Date End Date Taking? Authorizing Provider  acetaminophen (TYLENOL) 500 MG tablet Take 1,000 mg by mouth every 8 (eight) hours as needed for mild pain or headache.    Yes Historical Provider, MD  apixaban (ELIQUIS) 2.5 MG TABS tablet TAKE 1  TABLET (2.5 MG TOTAL) BY MOUTH 2 (TWO) TIMES DAILY. 11/27/14  Yes Garvin Fila, MD  b complex vitamins tablet Take 1 tablet by mouth 2 (two) times daily.    Yes Historical Provider, MD  Calcium Carb-Cholecalciferol (CALCIUM 600 + D) 600-200 MG-UNIT TABS Take 1 tablet by mouth 2 (two) times daily.   Yes Historical Provider, MD  Cholecalciferol (VITAMIN D) 2000 UNITS tablet Take 2,000 Units by mouth daily.   Yes Historical Provider, MD  diphenhydramine-acetaminophen (TYLENOL PM) 25-500 MG TABS Take 1 tablet by mouth at bedtime as needed (sleep).    Yes Historical Provider, MD  divalproex (DEPAKOTE) 125 MG DR tablet Take 2 tablets (250 mg total) by mouth 2 (two) times daily. 07/25/14  Yes Garvin Fila, MD  Docusate Sodium (COLACE PO) Take by mouth daily.    Yes Historical Provider, MD  Melatonin 3 MG TABS Take 1 tablet by mouth at bedtime   Yes Historical Provider, MD  Multiple Vitamin (MULTIVITAMIN WITH MINERALS) TABS Take 1 tablet by mouth daily.   Yes Historical Provider, MD  ondansetron (ZOFRAN) 4 MG tablet Take 1 tablet (4 mg total) by mouth every 6 (six) hours as needed for nausea. 10/31/14  Yes Delfina Redwood, MD  oxybutynin (DITROPAN) 5 MG tablet Take 1 tablet (5 mg total) by mouth 2 (two) times daily. 12/11/14  Yes Gildardo Cranker, DO    Allergies  Allergen Reactions  . Aricept [Donepezil Hcl] Hypertension    With N/V  . Tramadol Hcl Other (See Comments)     lethargy, nausea  . Alendronate Sodium Rash and Other (See Comments)    puffy eyes    Physical Exam  Vitals  Blood pressure 168/73, pulse 55, temperature 98 F (36.7 C), temperature source Oral, resp. rate 16, height 5\' 6"  (1.676 m), weight 59.784 kg (131 lb 12.8 oz), SpO2 96 %.   General:  Well nourished white female lying in bed in NAD,   Psych:  Normal affect and insight, Not Suicidal or Homicidal, Awake Alert, Oriented X 3.  Neuro:   No F.N deficits, ALL C.Nerves Intact, Strength 5/5 all 4 extremities, Sensation  intact all 4 extremities.  ENT:  Hard of hearing. Eyes appear normal, conjunctivae clear, PER. Moist oral mucosa without erythema or exudates.  Neck:  Supple, No lymphadenopathy appreciated  Respiratory:  Symmetrical  chest wall movement, Good air movement bilaterally, CTAB.  Cardiac:  RRR, no murmurs, no LE edema noted, no JVD.    Abdomen:  Positive bowel sounds, Soft, non distended. Abdomen with mild diffuse tenderness, most pronounced in LLQ and suprapubic area.   No masses appreciated  Skin:  No Cyanosis, Normal Skin Turgor, No Skin Rash or Bruise.  Extremities:  Able to move all 4. 5/5 strength in each,  no effusions.   Data Review  CBC  Recent Labs Lab 12/18/14 0144 12/18/14 0151  WBC 4.9  --   HGB 12.9 13.6  HCT 40.1 40.0  PLT 173  --   MCV 94.1  --   MCH 30.3  --   MCHC 32.2  --   RDW 14.3  --   LYMPHSABS 1.5  --   MONOABS 0.9  --   EOSABS 0.2  --   BASOSABS 0.0  --     Chemistries   Recent Labs Lab 12/18/14 0144 12/18/14 0151  NA 140 141  K 4.5 4.4  CL 104 103  CO2 27  --   GLUCOSE 99 98  BUN 20 22*  CREATININE 1.03* 1.00  CALCIUM 9.2  --   AST 23  --   ALT 9*  --   ALKPHOS 55  --   BILITOT 0.4  --     Coagulation profile  Recent Labs Lab 12/18/14 0144  INR 1.12    Urinalysis    Component Value Date/Time   COLORURINE YELLOW 12/18/2014 0225   APPEARANCEUR TURBID* 12/18/2014 0225   LABSPEC 1.012 12/18/2014 0225   PHURINE 7.5 12/18/2014 0225   GLUCOSEU NEGATIVE 12/18/2014 0225   GLUCOSEU NEGATIVE 10/23/2014 1509   HGBUR SMALL* 12/18/2014 0225   BILIRUBINUR NEGATIVE 12/18/2014 0225   BILIRUBINUR neg 10/21/2014 1516   KETONESUR NEGATIVE 12/18/2014 0225   PROTEINUR NEGATIVE 12/18/2014 0225   PROTEINUR 1+ 10/21/2014 1516   UROBILINOGEN 0.2 12/18/2014 0225   UROBILINOGEN negative 10/21/2014 1516   NITRITE NEGATIVE 12/18/2014 0225   NITRITE neg 10/21/2014 1516   LEUKOCYTESUR LARGE* 12/18/2014 0225    Imaging results:   Ct Head  Wo Contrast  12/18/2014   CLINICAL DATA:  Code stroke, left-sided weakness, history of strokes, hypertension, atrial fibrillation, former smoker  EXAM: CT HEAD WITHOUT CONTRAST  TECHNIQUE: Contiguous axial images were obtained from the base of the skull through the vertex without intravenous contrast.  COMPARISON:  08/24/2013  FINDINGS: Generalized atrophy.  Normal ventricular morphology.  No midline shift or mass effect.  Extensive small vessel chronic ischemic changes of deep cerebral white matter.  Old lacunar infarct RIGHT basal ganglia.  Old cortical infarcts RIGHT frontal and LEFT posterior parietal.  No intracranial hemorrhage, mass lesion or evidence acute infarction.  No extra-axial fluid collections.  Atherosclerotic calcifications at the carotid siphons.  Bones and sinuses grossly unremarkable.  IMPRESSION: Old infarcts RIGHT basal ganglia, RIGHT frontal and LEFT posterior parietal.  Atrophy with small vessel chronic ischemic changes of deep cerebral white matter.  No acute intracranial abnormalities.  Critical Value/emergent results were called by telephone at the time of interpretation on 12/18/2014 at 0206 hr to Dr. [name], who verbally acknowledged these results.   Electronically Signed   By: Lavonia Dana M.D.   On: 12/18/2014 02:07   Mr [name] Head Wo Contrast  12/18/2014   CLINICAL DATA:  Increasing LEFT-sided weakness beginning yesterday, near fall. History of LEFT-sided hemi paresis, hypertension, atrial fibrillation, migraine, mild cognitive impairment.  EXAM: MRI HEAD WITHOUT  CONTRAST  MRA HEAD WITHOUT CONTRAST  TECHNIQUE: Multiplanar, multiecho pulse sequences of the brain and surrounding structures were obtained without intravenous contrast. Angiographic images of the head were obtained using MRA technique without contrast.  COMPARISON:  CT head December 18, 2014 Set 1:51 a.m. and MRI of the head Aug 24, 2013  FINDINGS: MRI HEAD FINDINGS  No reduced diffusion to suggest acute ischemia. No  susceptibility artifact to suggest hemorrhage. LEFT inferior parietal lobe encephalomalacia. RIGHT mesial frontal lobe encephalomalacia. Old cystic RIGHT basal ganglia lacunar infarct with mild ex vacuo dilatation of subjacent ventricle. Moderate to severe ventriculomegaly on the basis of global parenchymal brain volume loss. Old small RIGHT cerebellar infarcts. Confluent supratentorial white matter T2 hyperintense signal compatible with chronic small vessel ischemic disease without midline shift, mass effect or mass lesions.  No abnormal extra-axial fluid collections. Status post bilateral ocular lens implants. The paranasal sinuses and mastoid air cells are well aerated. No abnormal sellar expansion. No cerebellar tonsillar ectopia. No suspicious calvarial bone marrow signal. Generalized bright T1 bone marrow signal compatible with osteopenia.  MRA HEAD FINDINGS  Anterior circulation: Normal flow related enhancement of the included cervical, petrous, cavernous and supra clinoid internal carotid arteries. Patent anterior communicating artery. Normal flow related enhancement of the anterior and middle cerebral arteries, including more distal segments. Moderate luminal regularity of the mid to distal MCA branches, similar to prior imaging, high-grade stenosis bilateral distal A2 segments, similar to progressed.  No large vessel occlusion, aneurysm.  Posterior circulation: Codominant vertebral arteries. Basilar artery is patent, with normal flow related enhancement of the main branch vessels. Normal flow related enhancement of the posterior cerebral arteries. Moderate stenosis LEFT distal P2 segment, unchanged.  No large vessel occlusion, abnormal luminal irregularity, aneurysm.  IMPRESSION: MRI HEAD: No acute intracranial process.  Chronic changes including: Old RIGHT ACA territory infarct. Old LEFT MCA versus watershed parietal infarct. Old RIGHT basal ganglia lacunar infarct.  MRA HEAD: No acute large vessel  occlusion.  High-grade stenosis bilateral distal A2 segments, moderate stenosis LEFT P2 segment. Moderate luminal regularity of the mid to distal MCA branches; findings are compatible with intracranial atherosclerosis.   Electronically Signed   By: Elon Alas M.D.   On: 12/18/2014 04:52   Mr Brain Wo Contrast  12/18/2014   CLINICAL DATA:  Increasing LEFT-sided weakness beginning yesterday, near fall. History of LEFT-sided hemi paresis, hypertension, atrial fibrillation, migraine, mild cognitive impairment.  EXAM: MRI HEAD WITHOUT CONTRAST  MRA HEAD WITHOUT CONTRAST  TECHNIQUE: Multiplanar, multiecho pulse sequences of the brain and surrounding structures were obtained without intravenous contrast. Angiographic images of the head were obtained using MRA technique without contrast.  COMPARISON:  CT head December 18, 2014 Set 1:51 a.m. and MRI of the head Aug 24, 2013  FINDINGS: MRI HEAD FINDINGS  No reduced diffusion to suggest acute ischemia. No susceptibility artifact to suggest hemorrhage. LEFT inferior parietal lobe encephalomalacia. RIGHT mesial frontal lobe encephalomalacia. Old cystic RIGHT basal ganglia lacunar infarct with mild ex vacuo dilatation of subjacent ventricle. Moderate to severe ventriculomegaly on the basis of global parenchymal brain volume loss. Old small RIGHT cerebellar infarcts. Confluent supratentorial white matter T2 hyperintense signal compatible with chronic small vessel ischemic disease without midline shift, mass effect or mass lesions.  No abnormal extra-axial fluid collections. Status post bilateral ocular lens implants. The paranasal sinuses and mastoid air cells are well aerated. No abnormal sellar expansion. No cerebellar tonsillar ectopia. No suspicious calvarial bone marrow signal. Generalized bright T1 bone marrow signal  compatible with osteopenia.  MRA HEAD FINDINGS  Anterior circulation: Normal flow related enhancement of the included cervical, petrous, cavernous  and supra clinoid internal carotid arteries. Patent anterior communicating artery. Normal flow related enhancement of the anterior and middle cerebral arteries, including more distal segments. Moderate luminal regularity of the mid to distal MCA branches, similar to prior imaging, high-grade stenosis bilateral distal A2 segments, similar to progressed.  No large vessel occlusion, aneurysm.  Posterior circulation: Codominant vertebral arteries. Basilar artery is patent, with normal flow related enhancement of the main branch vessels. Normal flow related enhancement of the posterior cerebral arteries. Moderate stenosis LEFT distal P2 segment, unchanged.  No large vessel occlusion, abnormal luminal irregularity, aneurysm.  IMPRESSION: MRI HEAD: No acute intracranial process.  Chronic changes including: Old RIGHT ACA territory infarct. Old LEFT MCA versus watershed parietal infarct. Old RIGHT basal ganglia lacunar infarct.  MRA HEAD: No acute large vessel occlusion.  High-grade stenosis bilateral distal A2 segments, moderate stenosis LEFT P2 segment. Moderate luminal regularity of the mid to distal MCA branches; findings are compatible with intracranial atherosclerosis.   Electronically Signed   By: Elon Alas M.D.   On: 12/18/2014 04:52    My personal review of EKG: Atrial flutter, old anteroseptal infarct  Assessment & Plan  Principal Problem:   Left-sided weakness Active Problems:   HTN (hypertension)   Abdominal pain   UTI (lower urinary tract infection)   Atrial flutter   Chronic anticoagulation   Nausea without vomiting   History of CVA (cerebrovascular accident)  1. Progressive left sided weakness / history of CVA. 81 year old female with has hx of CVAs on Eliquis presenting with worsening of chronic left sided weakness. CTscan and brain MRI / MRA negative for acute abnormalities, see report above. I cancelled the Echo as she just had one in August. Carotid doppler studies pending.   Neurology following, Eliquis to be continued. Her left sided weakness seems to be resolving.Will admit for further evaluation and treatment.    2. Lower abdominal pain / nausea. This just started in ED per daughters. Bowels moving well at home. No urinary symptoms. She does exhibit some mild LLQ and suprapubic tenderness on exam. Labs WNL. Will obtain KUB to evaluate for constipation, especially since she recently started anti-cholinergic. On Rocephin for possible UTI. Await urine culture. Continue supportive care with anti-emetics, analgesics. Patient takes Depakote ( not sure if for headaches, seizures, or other), will obtain valproic acid level.   3. Atrial flutter, chronic. Rate controlled [place] Vasc 7. On Eliquis  4. HTN. Hx of HTN listed in [place] but not on anti-hypertensives at home. Will given Hydralazine prn  5.  UTI.  Large Leukocytes, many bacteria. Nitrite negative. Sending urine for culture. Treat empirically with Rocephin for now.    Consultants Called: Neurology   Family Communication:   Spoke with 2 daughters in room  Code Status:  DNI. Patient agreeable to CPR. She does not want to be intubated.   Condition:  Guarded  Potential Disposition:   Time spent in minutes : [place], NP on 12/18/2014 at 7:25 AM Between 7am to 7pm - Pager - [phone] After 7pm go to [email or web address] - password [email or web address] And look for the night coverage person covering me after hours  [facility]

## 2014-12-18 NOTE — ED Provider Notes (Signed)
This chart was scribed for [name], DO by Forrestine Him, ED Scribe. This patient was seen in room A12C/A12C and the patient's care was started 2:17 AM.   TIME SEEN: 2:17 AM   CHIEF COMPLAINT:  Chief Complaint  Patient presents with  . Code Stroke     HPI:  HPI Comments: [name] brought in by EMS is a 81 y.o. female with a PMHx of HTN, stroke, TIA, and hyperlipidemia who presents to the Emergency Department here for code stroke this evening. She reports constant, ongoing L sided weakness that started at 7 PM. Pt states she is typically weak on her L side due to previous strokes but states tonights episode felt different as it was more difficult for her to get up to use the bathroom. She denies any new numbness. No recent fever, chills, nausea, vomiting, diarrhea, chest pain, or shortness of breath. Currently pt states she is improving back to her baseline.  PCP: [name], DO   ROS: See HPI Constitutional: no fever  Eyes: no drainage  ENT: no runny nose   Cardiovascular:  no chest pain  Resp: no SOB  GI: no vomiting GU: no dysuria Integumentary: no rash  Allergy: no hives  Musculoskeletal: no leg swelling  Neurological: no slurred speech. Positive weakness ROS otherwise negative  PAST MEDICAL HISTORY/PAST SURGICAL HISTORY:  Past Medical History  Diagnosis Date  . Arthritis   . Hypertension   . Migraine   . Colon polyp   . Hearing difficulty   . Torus palatinus   . Atrial flutter January, 2012  . Stroke 08/02/12     right lenticular nucleus infarct  . Left leg weakness 02/05/2013  . Chronic anticoagulation   . High cholesterol   . Atrial fibrillation     on Eliquis Rx  . Mild cognitive impairment with memory loss   . Malnutrition   . TIA (transient ischemic attack)   . Constipation   . Osteoporosis   . Hemorrhoids     MEDICATIONS:  Prior to Admission medications   Medication Sig Start Date End Date Taking? Authorizing Provider  acetaminophen  (TYLENOL) 500 MG tablet Take 1,000 mg by mouth every 8 (eight) hours as needed for mild pain or headache.     Historical Provider, MD  apixaban (ELIQUIS) 2.5 MG TABS tablet TAKE 1 TABLET (2.5 MG TOTAL) BY MOUTH 2 (TWO) TIMES DAILY. 11/27/14   Garvin Fila, MD  b complex vitamins tablet Take 1 tablet by mouth 2 (two) times daily.     Historical Provider, MD  Calcium Carb-Cholecalciferol (CALCIUM 600 + D) 600-200 MG-UNIT TABS Take 1 tablet by mouth 2 (two) times daily.    Historical Provider, MD  Cholecalciferol (VITAMIN D) 2000 UNITS tablet Take 2,000 Units by mouth daily.    Historical Provider, MD  diphenhydramine-acetaminophen (TYLENOL PM) 25-500 MG TABS Take 1 tablet by mouth at bedtime as needed.    Historical Provider, MD  divalproex (DEPAKOTE) 125 MG DR tablet Take 2 tablets (250 mg total) by mouth 2 (two) times daily. 07/25/14   Garvin Fila, MD  Docusate Sodium (COLACE PO) Take by mouth daily.     Historical Provider, MD  hydrocortisone (ANUSOL-HC) 2.5 % rectal cream Place 1 application rectally 2 (two) times daily. 06/24/14   Rosalita Chessman, DO  hydrocortisone (ANUSOL-HC) 25 MG suppository Place 1 suppository (25 mg total) rectally 2 (two) times daily. 06/24/14   Rosalita Chessman, DO  Melatonin 3 MG TABS  Take 1 tablet by mouth at bedtime    Historical Provider, MD  Multiple Vitamin (MULTIVITAMIN WITH MINERALS) TABS Take 1 tablet by mouth daily.    Historical Provider, MD  ondansetron (ZOFRAN) 4 MG tablet Take 1 tablet (4 mg total) by mouth every 6 (six) hours as needed for nausea. 10/31/14   Delfina Redwood, MD  oxybutynin (DITROPAN) 5 MG tablet Take 1 tablet (5 mg total) by mouth 2 (two) times daily. 12/11/14   Gildardo Cranker, DO    ALLERGIES:  Allergies  Allergen Reactions  . Aricept [Donepezil Hcl] Hypertension    With N/V  . Tramadol Hcl Other (See Comments)     lethargy, nausea  . Alendronate Sodium Rash and Other (See Comments)    puffy eyes    SOCIAL HISTORY:  Social History   Substance Use Topics  . Smoking status: Former Research scientist (life sciences)  . Smokeless tobacco: Never Used  . Alcohol Use: No     Comment: 08/01/2012 "glass of wine on special occasions"    FAMILY HISTORY: Family History  Problem Relation Age of Onset  . Colon cancer Father   . Breast cancer    . Brain cancer    . Cancer Mother     breast  . Aneurysm Mother     brain  . Heart attack Neg Hx   . Diabetes Neg Hx   . Hypertension Neg Hx   . Stroke Mother     EXAM: BP 175/87 mmHg  Pulse 77  Temp(Src) 97.5 F (36.4 C) (Oral)  Ht 5\' 7"  (1.702 m)  Wt 130 lb (58.968 kg)  BMI 20.36 kg/m2  SpO2 96% CONSTITUTIONAL: Alert and oriented and responds appropriately to questions. Well-appearing; well-nourished, elderly, pleasant, no distress HEAD: Normocephalic EYES: Conjunctivae clear, PERRL ENT: normal nose; no rhinorrhea; moist mucous membranes; pharynx without lesions noted NECK: Supple, no meningismus, no LAD  CARD: RRR; S1 and S2 appreciated; no murmurs, no clicks, no rubs, no gallops RESP: Normal chest excursion without splinting or tachypnea; breath sounds clear and equal bilaterally; no wheezes, no rhonchi, no rales, no hypoxia or respiratory distress, speaking full sentences ABD/GI: Normal bowel sounds; non-distended; soft, non-tender, no rebound, no guarding, no peritoneal signs BACK:  The back appears normal and is non-tender to palpation, there is no CVA tenderness EXT: Normal ROM in all joints; non-tender to palpation; no edema; normal capillary refill; no cyanosis, no calf tenderness or swelling    SKIN: Normal color for age and race; warm NEURO: Moves all extremities equally, sensation to light touch intact diffusely, cranial nerves II through XII intact, no pronator drift, stroke scale 0 when I have assess patient PSYCH: The patient's mood and manner are appropriate. Grooming and personal hygiene are appropriate.  MEDICAL DECISION MAKING: Patient here with left-sided weakness. Code stroke  initiated but patient now has a stroke scale of 0 and is not a TPA candidate. She has atrophic relation and is on L request. Neurology has seen patient and have recommended medicine admission. Head CT shows no acute abnormalities. Labs unremarkable. She does have a urinary tract infection which may be exacerbating her previous stroke symptoms. We'll give IV ceftriaxone and obtain urine culture. Discussed with hospitalist for admission for stroke workup. Neurologist has ordered MRI of her brain.      EKG Interpretation  Date/Time:  Wednesday December 18 2014 02:23:19 EDT Ventricular Rate:  94 PR Interval:    QRS Duration: 95 QT Interval:  398 QTC Calculation: 498 R Axis:  36 Text Interpretation:  Atrial flutter Anteroseptal infarct, old No significant change since last tracing Confirmed by WARD,  DO, KRISTEN ([id number]) on 12/18/2014 2:38:13 AM         I personally performed the services described in this documentation, which was scribed in my presence. The recorded information has been reviewed and is accurate.   [place], DO 12/18/14 [phone]

## 2014-12-18 NOTE — Evaluation (Signed)
Physical Therapy Evaluation Patient Details Name: [name] MRN: [id number] DOB: 1924/04/06 Today's Date: 12/18/2014   History of Present Illness  [name] is a 81 y.o. female, with atrial fibrillation and CVA (2014 and 2015) on Eliquis. She was brought to ED during the night with worsening left sided weakness. Neurology has evaluated and ntoed that patient had had multiple admissions for worsening hemparesis in setting of infection or dehyration. Her brain CTscan is negative for acute abnormalities. MRI/ MRA of brain also negative for acute abnormalities.   Clinical Impression  Pt admitted with the above complications. Pt currently with functional limitations due to the deficits listed below (see PT Problem List). Requires assistance at baseline for all ADLs, uses a RW for mobility. Currently requires assistance to ambulate. Daughter present and states pt much weaker currently than at baseline and does not feel she can safely care for patient. Pt will benefit from skilled PT to increase their independence and safety with mobility to allow discharge to the venue listed below.       Follow Up Recommendations SNF;Supervision/Assistance - 24 hour - If pt does not qualify for SNF would recommend maximizing HH services including HHPT and Aide, possibly additional equipment pending progress. Will monitor and update as appropriate.    Equipment Recommendations   (TBD based on discarge location)    Recommendations for Other Services       Precautions / Restrictions Precautions Precautions: Fall Restrictions Weight Bearing Restrictions: No      Mobility  Bed Mobility Overal bed mobility: Needs Assistance Bed Mobility: Supine to Sit;Sit to Supine     Supine to sit: Min guard Sit to supine: Min assist   General bed mobility comments: Min guard to exit bed with cues for technique to scoot to EOB. Min assist for LE support to enter bed.  Transfers Overall transfer level: Needs  assistance Equipment used: Rolling walker (2 wheeled) Transfers: Sit to/from Stand Sit to Stand: Mod assist         General transfer comment: Mod assist for boost from lowest bed setting and toilet. VC for hand placement and anterior weight shift for momentum to rise.  Ambulation/Gait Ambulation/Gait assistance: Min assist Ambulation Distance (Feet): 15 Feet (x2) Assistive device: Rolling walker (2 wheeled) Gait Pattern/deviations: Step-through pattern;Decreased stance time - left;Antalgic;Trunk flexed Gait velocity: slow Gait velocity interpretation: <1.8 ft/sec, indicative of risk for recurrent falls General Gait Details: Min assist for balance and walker control with navigation. Bumps into objects in room when not assisted. Antalgic gait reports Rt knee pain which is baseline. VC for walker placement for proximity.  Stairs            Wheelchair Mobility    Modified Rankin (Stroke Patients Only) Modified Rankin (Stroke Patients Only) Pre-Morbid Rankin Score: Moderate disability Modified Rankin: Moderately severe disability     Balance Overall balance assessment: Needs assistance Sitting-balance support: No upper extremity supported;Feet supported Sitting balance-Leahy Scale: Fair     Standing balance support: Bilateral upper extremity supported Standing balance-Leahy Scale: Poor                               Pertinent Vitals/Pain Pain Assessment: Faces Faces Pain Scale: Hurts little more Pain Location: Abdomen Pain Descriptors / Indicators: Guarding Pain Intervention(s): Monitored during session;Repositioned    Home Living Family/patient expects to be discharged to:: Private residence Living Arrangements: Children Available Help at Discharge: Family;Available 24 hours/day Type of  Home: House Home Access: Stairs to enter Entrance Stairs-Rails: None Entrance Stairs-Number of Steps: Daughter reports 1 step in from the garage that they have put an  intermediate step in so pt does not have to step so high.  Home Layout: Two level Home Equipment: Walker - 2 wheels;Shower seat - built in;Bedside commode;Transport chair      Prior Function Level of Independence: Needs assistance   Gait / Transfers Assistance Needed: Uses RW on main level of home. HHA upstairs in bathroom due to space limitations for the walker.   ADL's / Homemaking Assistance Needed: Needs assist with all ADLs   Comments: Reports she has been in respite care for a while     Hand Dominance   Dominant Hand: Right    Extremity/Trunk Assessment   Upper Extremity Assessment: Defer to OT evaluation           Lower Extremity Assessment: Generalized weakness;Difficult to assess due to impaired cognition         Communication   Communication: HOH  Cognition Arousal/Alertness: Awake/alert Behavior During Therapy: WFL for tasks assessed/performed Overall Cognitive Status: History of cognitive impairments - at baseline                      General Comments General comments (skin integrity, edema, etc.): Daughter present states she cannot care for patient safely in this condition.    Exercises        Assessment/Plan    PT Assessment Patient needs continued PT services  PT Diagnosis Difficulty walking;Abnormality of gait;Generalized weakness;Acute pain   PT Problem List Decreased strength;Decreased activity tolerance;Decreased balance;Decreased mobility;Decreased cognition;Decreased knowledge of use of DME;Pain  PT Treatment Interventions DME instruction;Gait training;Functional mobility training;Therapeutic activities;Therapeutic exercise;Balance training;Neuromuscular re-education;Patient/family education   PT Goals (Current goals can be found in the Care Plan section) Acute Rehab PT Goals Patient Stated Goal: None stated PT Goal Formulation: With patient Time For Goal Achievement: 01/01/15 Potential to Achieve Goals: Fair    Frequency Min  3X/week   Barriers to discharge Decreased caregiver support daughter states they cannot care for pt in this state of health    Co-evaluation               End of Session Equipment Utilized During Treatment: Gait belt Activity Tolerance: Patient limited by fatigue Patient left: in bed;with call bell/phone within reach;with family/visitor present Nurse Communication: Mobility status    Functional Assessment Tool Used: clinical observation Functional Limitation: Mobility: Walking and moving around Mobility: Walking and Moving Around Current Status (O3785): At least 20 percent but less than 40 percent impaired, limited or restricted Mobility: Walking and Moving Around Goal Status [id number]): At least 1 percent but less than 20 percent impaired, limited or restricted    Time: 7741-2878 PT Time Calculation (min) (ACUTE ONLY): 24 min   Charges:   PT Evaluation $Initial PT Evaluation Tier I: 1 Procedure PT Treatments $Therapeutic Activity: 8-22 mins   PT G Codes:   PT G-Codes **NOT FOR INPATIENT CLASS** Functional Assessment Tool Used: clinical observation Functional Limitation: Mobility: Walking and moving around Mobility: Walking and Moving Around Current Status (M7672): At least 20 percent but less than 40 percent impaired, limited or restricted Mobility: Walking and Moving Around Goal Status [id number]): At least 1 percent but less than 20 percent impaired, limited or restricted    Ellouise Newer 12/18/2014, 11:53 AM Elayne Snare, [place]

## 2014-12-19 ENCOUNTER — Inpatient Hospital Stay (HOSPITAL_COMMUNITY): Payer: Medicare Other

## 2014-12-19 DIAGNOSIS — M6289 Other specified disorders of muscle: Secondary | ICD-10-CM

## 2014-12-19 LAB — BASIC METABOLIC PANEL
ANION GAP: 5 (ref 5–15)
BUN: 16 mg/dL (ref 6–20)
CHLORIDE: 102 mmol/L (ref 101–111)
CO2: 31 mmol/L (ref 22–32)
Calcium: 8.4 mg/dL — ABNORMAL LOW (ref 8.9–10.3)
Creatinine, Ser: 0.95 mg/dL (ref 0.44–1.00)
GFR calc Af Amer: >60 mL/min (ref >60)
GFR, EST NON AFRICAN AMERICAN: 52 mL/min — AB (ref >60)
GLUCOSE: 106 mg/dL — AB (ref 65–99)
POTASSIUM: 3.9 mmol/L (ref 3.5–5.1)
Sodium: 138 mmol/L (ref 135–145)

## 2014-12-19 LAB — URINE CULTURE

## 2014-12-19 LAB — LIPID PANEL
CHOLESTEROL: 147 mg/dL (ref 0–200)
HDL: 47 mg/dL (ref >40)
LDL CALC: 87 mg/dL (ref 0–99)
TRIGLYCERIDES: 64 mg/dL (ref <150)
Total CHOL/HDL Ratio: 3.1 RATIO
VLDL: 13 mg/dL (ref 0–40)

## 2014-12-19 NOTE — Clinical Social Work Note (Signed)
Clinical Social Work Assessment  Patient Details  Name: [name] MRN: [id number] Date of Birth: Dec 22, 1924  Date of referral:  12/19/14               Reason for consult:  Discharge Planning, Facility Placement                Permission sought to share information with:  Case Manager, Customer service manager, Family Supports Permission granted to share information::  Yes, Verbal Permission Granted  Name::     [facility] and Zollie Scale   Agency::   (SNF's )  Relationship::   (Daughters )  Sport and exercise psychologist Information:   [phone])  Housing/Transportation Living arrangements for the past 2 months:  [place] of Information:  Patient, Adult Children Patient Interpreter Needed:  None Criminal Activity/Legal Involvement Pertinent to Current Situation/Hospitalization:  No - Comment as needed Significant Relationships:  Adult Children Lives with:  Adult Children Do you feel safe going back to the place where you live?  Yes Need for family participation in patient care:  Yes (Comment)  Care giving concerns:  Patient requiring 24 hour supervision/assistance and continued therapy.    Social Worker assessment / plan:  Holiday representative met with patient and pt's family present at bedside in reference to post-acute placement for SNF. CSW introduced CSW role and SNF process. CSW also reviewed and provided SNF list. Patient dtr, [name] reported that adult children [name], Lockridge, Kit and patient all live together). Pt's dtr, [name] continued to report that pt is deconditioned and believes that patient would benefit from short-term rehab to become stronger before returning home with family. Pt's dtr expressed that they have attempted to lift and turn pt with assistance from therapy and stated it has been challenging. Pt's dtrs all in agreement that patient should transition to SNF at discharge. Pt's dtr, [name] stated that she and [name] are patient's HCPOA. Pt's dtr, Shona Needles stated that  pt was a short-term resident of [place] [facility] [place] before in May of 2015. Pt stated she enjoyed her stay at [facility] and wishes to return if possible. Pt's dtrs reported their second choice would be [facility] [place]. CSW explained 3 midnight qualifying stay under Medicare guidelines and pt's dtrs expressed understanding. No further concerns reported by patient's family at this time. CSW will continue to follow pt and pt's family for continued support and to facilitate pt's discharge needs once medically stable.   Employment status:  Retired Forensic scientist:  Medicare PT Recommendations:  [place] / Referral to community resources:  [place]  Patient/Family's Response to care:  Pt alert and oriented x3. Pt and pt's dtrs agreeable to SNF placement and prefers [employer]. Pt's dtrs supportive and strongly involved in pt's care. Pt and family pleasant and appreciated social work intervention.   Patient/Family's Understanding of and Emotional Response to Diagnosis, Current Treatment, and Prognosis:  Pt's family aware that patient was negative for stroke however positive for UTI. Pt's family knowledgeable to discharge planning and on-going treatment. CSW remains available as needed.   Emotional Assessment Appearance:  Appears stated age Attitude/Demeanor/Rapport:   (Pleasant) Affect (typically observed):  Accepting, Calm, Pleasant, Hopeful Orientation:  Oriented to Self, Oriented to Place, Oriented to Situation Alcohol / Substance use:  Not Applicable Psych involvement (Current and /or in the community):  No (Comment)  Discharge Needs  Concerns to be addressed:  Care Coordination Readmission within the last 30  days:  No Current discharge risk:  Dependent with Mobility Barriers to Discharge:  Continued Medical Work up  [employer], MSW, [place] [phone] 12/19/2014 2:07 PM

## 2014-12-19 NOTE — Progress Notes (Signed)
*  PRELIMINARY RESULTS* Vascular Ultrasound Carotid Duplex (Doppler) has been completed.  Findings suggest 1-39% internal carotid artery stenosis bilaterally. Vertebral arteries are patent with antegrade flow.  12/19/2014 3:39 PM Maudry Mayhew, RVT, RDCS, RDMS

## 2014-12-19 NOTE — Clinical Social Work Note (Signed)
Clinical Social Worker has assessed patient and family present at bedside. Patient and family agreeable to SNF placement.  Full psychosocial assessment to follow.   CSW remains available as needed.  Glendon Axe, MSW, LCSWA [phone] 12/19/2014 12:16 PM

## 2014-12-19 NOTE — Evaluation (Signed)
Occupational Therapy Evaluation Patient Details Name: [name] MRN: [id number] DOB: 09-10-1924 Today's Date: 12/19/2014    History of Present Illness [name] is a 81 y.o. female, with atrial fibrillation and CVA (2014 and 2015) on Eliquis. She was brought to ED during the night with worsening left sided weakness. Neurology has evaluated and ntoed that patient had had multiple admissions for worsening hemparesis in setting of infection or dehyration. Her brain CTscan is negative for acute abnormalities. MRI/ MRA of brain also negative for acute abnormalities.    Clinical Impression   Pt admitted with above complications.  Pt currently with functional limitations due to deficits listed below.  Pt required assistance at baseline for all ADLs, use of RW or HHA for mobility.  Pt requires increased assist for mobility, transfers, and ambulation.  With ambulation and transfers, pt bumping into items on Lt frequently, requiring mod verbal cues for sequencing and safety with walker.  Educated on visual scanning to Lt with mobility, as question mild Lt visual field impairment.  Pt's two daughters present and they state that pt is much weaker than baseline level and they hope for additional rehab to all pt to progress to higher level before they can return to caring for her. Pt will benefit from skilled OT to increase her independence and safety with self-care tasks and transfers to allow discharge to venue listed below.    Follow Up Recommendations  SNF;Supervision/Assistance - 24 hour    Equipment Recommendations  None recommended by OT    Recommendations for Other Services       Precautions / Restrictions Precautions Precautions: Fall Restrictions Weight Bearing Restrictions: No      Mobility Bed Mobility Overal bed mobility: Needs Assistance Bed Mobility: Supine to Sit;Sit to Supine     Supine to sit: Min guard Sit to supine: Min assist   General bed mobility comments: Min  guard to exit bed with cues for technique to scoot to EOB. Min assist for LE support to enter bed. Pt fatigues quickly requiring increased cues and assist due to mild impulsiveness with fatigue.  Transfers Overall transfer level: Needs assistance Equipment used: Rolling walker (2 wheeled) Transfers: Sit to/from Stand Sit to Stand: Mod assist         General transfer comment: Mod assist for boost from lowest bed setting and toilet. VC for hand placement and anterior weight shift for momentum to rise.         ADL Overall ADL's : Needs assistance/impaired     Grooming: Set up                   Toilet Transfer: Moderate assistance;RW;BSC   Toileting- Clothing Manipulation and Hygiene: Moderate assistance       Functional mobility during ADLs: Moderate assistance;Cueing for safety;Cueing for sequencing;Rolling walker General ADL Comments: Pt requires max cues for sequencing and safety with mobility and safe distance within RW with sidestepping and retrostepping to reach toilet.     Vision Vision Assessment?: Yes Ocular Range of Motion: Within Functional Limits Tracking/Visual Pursuits: Impaired - to be further tested in functional context Visual Fields: Left visual field deficit          Pertinent Vitals/Pain Pain Assessment: Faces Faces Pain Scale: Hurts a little bit Pain Intervention(s): Monitored during session;Repositioned     Hand Dominance Right   Extremity/Trunk Assessment Upper Extremity Assessment Upper Extremity Assessment: Generalized weakness;LUE deficits/detail;Difficult to assess due to impaired cognition LUE Coordination: decreased fine motor;decreased gross motor  Lower Extremity Assessment Lower Extremity Assessment: Generalized weakness;Difficult to assess due to impaired cognition       Communication Communication Communication: HOH   Cognition Arousal/Alertness: Awake/alert Behavior During Therapy: WFL for tasks  assessed/performed Overall Cognitive Status: History of cognitive impairments - at baseline                                [name] expects to be discharged to:: Private residence Living Arrangements: Children Available Help at Discharge: Family;Available 24 hours/day Type of Home: House Home Access: Stairs to enter [employer] of Steps: Daughter reports 1 step in from the garage that they have put an intermediate step in so pt does not have to step so high.  Entrance Stairs-Rails: None Home Layout: Two level Alternate Level Stairs-Number of Steps: Stair Tree surgeon Shower/Tub: Occupational psychologist: Standard     Home Equipment: Environmental consultant - 2 wheels;Shower seat - built in;Bedside commode;Transport chair          Prior Functioning/Environment Level of Independence: Needs assistance  Gait / Transfers Assistance Needed: Uses RW on main level of home. HHA upstairs in bathroom due to space limitations for the walker.  ADL's / Homemaking Assistance Needed: Needs assist with all ADLs.  Pt's daughters report she can complete UB bathing and dressing with setup assist, but is total assist for LB bathing and dressing.  Pt requires max-total assist with toileting.   Comments: Reports she has been in respite care for a while    OT Diagnosis: Generalized weakness;Disturbance of vision;Hemiplegia non-dominant side   OT Problem List: Decreased strength;Decreased range of motion;Decreased activity tolerance;Impaired balance (sitting and/or standing);Impaired vision/perception;Decreased coordination;Decreased safety awareness;Impaired UE functional use   OT Treatment/Interventions: Self-care/ADL training;Neuromuscular education;DME and/or AE instruction;Energy conservation;Therapeutic activities;Visual/perceptual remediation/compensation;Patient/family education;Balance training    OT Goals(Current goals can be found in the care plan section)  Acute Rehab OT Goals Patient Stated Goal: to go home OT Goal Formulation: With patient Time For Goal Achievement: 01/02/15 Potential to Achieve Goals: Good  OT Frequency: Min 2X/week   Barriers to D/C: Decreased caregiver support  Pt's daughters report that they can not provide care for pt at current level          End of Session Equipment Utilized During Treatment: Rolling walker Nurse Communication: Mobility status  Activity Tolerance: Patient limited by fatigue Patient left: in bed;with bed alarm set;with family/visitor present   Time: 4431-5400 OT Time Calculation (min): 24 min Charges:  OT General Charges $OT Visit: 1 Procedure OT Evaluation $Initial OT Evaluation Tier I: 1 Procedure OT Treatments $Self Care/Home Management : 8-22 mins G-CodesSimonne Come, [phone] 12/19/2014, 11:35 AM

## 2014-12-19 NOTE — Progress Notes (Signed)
Physical Therapy Treatment Patient Details Name: [name] MRN: [id number] DOB: 05-16-1924 Today's Date: 12/19/2014    History of Present Illness [name] is a 81 y.o. female, with atrial fibrillation and CVA (2014 and 2015) on Eliquis. She was brought to ED during the night with worsening left sided weakness. Neurology has evaluated and ntoed that patient had had multiple admissions for worsening hemparesis in setting of infection or dehyration. Her brain CTscan is negative for acute abnormalities. MRI/ MRA of brain also negative for acute abnormalities.     PT Comments    Patient progressing with ambulation distance and able to tolerate exercises sitting and standing.  Will benefit from continued skilled PT in acute setting progressing to SNF level rehab prior to d/c home.  Follow Up Recommendations  SNF;Supervision/Assistance - 24 hour     Equipment Recommendations   (TBA)    Recommendations for Other Services       Precautions / Restrictions Precautions Precautions: Fall    Mobility  Bed Mobility Overal bed mobility: Needs Assistance       Supine to sit: Supervision;HOB elevated Sit to supine: Min guard   General bed mobility comments: use of rail to come up to sitting; guarding assist to supine for positioning  Transfers Overall transfer level: Needs assistance Equipment used: Rolling walker (2 wheeled) Transfers: Sit to/from [facility] Sit to Stand: Mod assist;Min assist Stand pivot transfers: Min assist;Mod assist       General transfer comment: cues for hand placement and hand over hand assist at times with left hand, cues for foot placement, assist for anterior weight shift and lifting assist to stand; to sit cues for safety backing all the way to seat and reaching back and slowing down for controlled descent; assist to pivot to [facility] with walker and cues, assist for walker positioning, etc  Ambulation/Gait Ambulation/Gait assistance:  Min assist Ambulation Distance (Feet): 160 Feet Assistive device: Rolling walker (2 wheeled) Gait Pattern/deviations: Step-through pattern;Step-to pattern;Decreased dorsiflexion - left;Decreased stride length;Decreased stance time - right;Trunk flexed     General Gait Details: cues for left foot clearance, stopped several times to cue pt for erect posture, left knee buckling in stance and increased flexion noted with increased fatigue   Stairs            Wheelchair Mobility    Modified Rankin (Stroke Patients Only) Modified Rankin (Stroke Patients Only) Pre-Morbid Rankin Score: Moderate disability Modified Rankin: Moderately severe disability     Balance Overall balance assessment: Needs assistance           Standing balance-Leahy Scale: Poor Standing balance comment: UE support needed for balance                    Cognition Arousal/Alertness: Awake/alert Behavior During Therapy: WFL for tasks assessed/performed Overall Cognitive Status: History of cognitive impairments - at baseline                      Exercises General Exercises - Lower Extremity Hip Flexion/Marching: Strengthening;Both;10 reps;Standing Toe Raises: AROM;Both;10 reps;Seated Heel Raises: Strengthening;Both;10 reps;Standing Other Exercises Other Exercises: Sit to stand x 3 repeated for instruction and LE strength Other Exercises:  seated rocking hands crossed over chest x 10 Other Exercises: scapular retraction/rows x 10    General Comments        Pertinent Vitals/Pain Pain Assessment: No/denies pain    Home Living  Prior Function            PT Goals (current goals can now be found in the care plan section) Progress towards PT goals: Progressing toward goals    Frequency  Min 3X/week    PT Plan Current plan remains appropriate    Co-evaluation             End of Session Equipment Utilized During Treatment: Gait  belt Activity Tolerance: Patient limited by fatigue Patient left: in bed;with call bell/phone within reach;with family/visitor present     Time: 4166-0630 PT Time Calculation (min) (ACUTE ONLY): 41 min  Charges:  $Gait Training: 8-22 mins $Therapeutic Exercise: 8-22 mins $Therapeutic Activity: 8-22 mins                    G Codes:      WYNN,CYNDI 2015/01/09, 4:35 PM  Magda Kiel, [place] 01-09-15

## 2014-12-19 NOTE — Clinical Social Work Placement (Signed)
   CLINICAL SOCIAL WORK PLACEMENT  NOTE  Date:  12/19/2014  Patient Details  Name: KSENIYA GRUNDEN MRN: [id number] Date of Birth: 1924/09/14  Clinical Social Work is seeking post-discharge placement for this patient at the [place] level of care (*CSW will initial, date and re-position this form in  chart as items are completed):  Yes   Patient/family provided with [facility]'s list of facilities offering this level of care within the geographic area requested by the patient (or if unable, by the patient's family).  Yes   Patient/family informed of their freedom to choose among providers that offer the needed level of care, that participate in Medicare, Medicaid or managed care program needed by the patient, have an available bed and are willing to accept the patient.  Yes   Patient/family informed of [facility]'s ownership interest in [facility] and [facility], as well as of the fact that they are under no obligation to receive care at these facilities.  PASRR submitted to EDS on       PASRR number received on       Existing PASRR number confirmed on 12/19/14     FL2 transmitted to all facilities in geographic area requested by pt/family on 12/19/14     FL2 transmitted to all facilities within larger geographic area on       Patient informed that his/her managed care company has contracts with or will negotiate with certain facilities, including the following:            Patient/family informed of bed offers received.  Patient chooses bed at       Physician recommends and patient chooses bed at      Patient to be transferred to   on  .  Patient to be transferred to facility by       Patient family notified on   of transfer.  Name of family member notified:        PHYSICIAN Please sign FL2     Additional Comment:    _______________________________________________ Glendon Axe, MSW, [place] [phone] 12/19/2014 2:08 PM

## 2014-12-19 NOTE — Progress Notes (Signed)
[facility] PROGRESS NOTE  [name] GXQ:[id number] DOB: 1924-11-21 DOA: 12/18/2014 PCP: [name], DO  Subjective:  Improved from yesterday, minimal abdominal pain and good strength in LUE. Patient is awake and alert. Speech is clear and appropriate.   Assessment/Plan: ?TIA: Patient has had left arm weakness since prior CVA. Likely acutely worsened in the setting of UTI. Improvement seen with initiation of IV abx-left arm weakness now back to usual baseline. [facility] Kitchen MRI brain shows no acute infarcts. Recent echo shows preserved EF and no embolic foci, and patient taking Eliquis for her A fib. Carotid doppler pending. LDL near goal at 87-? Not on statin. HgA1c pending.   Urinary Tract Infection: Patient presented with worsening LUE weakness, confusion, and LLQ abdominal pain, U/A positive for large leukocytes. Both symptoms resolving on IV ceftriaxone. Afebrile with no leukocytosis. Continue abx.   HTN: BPs stable, continue hydralazine as needed for systolic BP >144  Hyperlipidemia: LDL today is 87, continue management with diet and exercise. Not currently on statin therapy at home.   Atrial Fibrillation: On eliquis. Rate well controlled  Dementia: Tried Aricept-intolerant due to GI side effects. Continue Depakote.  Code Status: Partial Family Communication: 2 daughters at bedside at bedside ` Disposition Plan: SNF when ready  Consultants:  Neurology  Procedures:  Echocardiogram  Antibiotics:  Ceftriaxone 12/19/2014 >>   Objective: Filed Vitals:   12/19/14 1008  BP: 122/60  Pulse: 67  Temp: 97.9 F (36.6 C)  Resp: 18   No intake or output data in the 24 hours ending 12/19/14 1116 Filed Weights   12/18/14 0213 12/18/14 0543  Weight: 58.968 kg (130 lb) 59.784 kg (131 lb 12.8 oz)    Exam:   General:  Pleasant, NAD.   Head: Atraumatic  Neck: Supple, no LAD  Cardiovascular: RRR, no m/r/g. No LE edema  Respiratory: CTAB, no wheezes or rales  Abdomen:  Soft, non distended, no masses, +BS  Musculoskeletal: Appropriate movement in all 4  Neurologic: No focal deficits   Data Reviewed: Basic Metabolic Panel:  Recent Labs Lab 12/18/14 0144 12/18/14 0151 12/19/14 0433  NA 140 141 138  K 4.5 4.4 3.9  CL 104 103 102  CO2 27  --  31  GLUCOSE 99 98 106*  BUN 20 22* 16  CREATININE 1.03* 1.00 0.95  CALCIUM 9.2  --  8.4*   Liver Function Tests:  Recent Labs Lab 12/18/14 0144  AST 23  ALT 9*  ALKPHOS 55  BILITOT 0.4  PROT 5.7*  ALBUMIN 3.3*   No results for input(s): LIPASE, AMYLASE in the last 168 hours. No results for input(s): AMMONIA in the last 168 hours. CBC:  Recent Labs Lab 12/18/14 0144 12/18/14 0151  WBC 4.9  --   NEUTROABS 2.2  --   HGB 12.9 13.6  HCT 40.1 40.0  MCV 94.1  --   PLT 173  --    Cardiac Enzymes: No results for input(s): CKTOTAL, CKMB, CKMBINDEX, TROPONINI in the last 168 hours. BNP (last 3 results) No results for input(s): BNP in the last 8760 hours.  ProBNP (last 3 results) No results for input(s): PROBNP in the last 8760 hours.  CBG: No results for input(s): GLUCAP in the last 168 hours.  No results found for this or any previous visit (from the past 240 hour(s)).   Studies: Ct Head Wo Contrast  12/18/2014   CLINICAL DATA:  Code stroke, left-sided weakness, history of strokes, hypertension, atrial fibrillation, former smoker  EXAM: CT HEAD  WITHOUT CONTRAST  TECHNIQUE: Contiguous axial images were obtained from the base of the skull through the vertex without intravenous contrast.  COMPARISON:  08/24/2013  FINDINGS: Generalized atrophy.  Normal ventricular morphology.  No midline shift or mass effect.  Extensive small vessel chronic ischemic changes of deep cerebral white matter.  Old lacunar infarct RIGHT basal ganglia.  Old cortical infarcts RIGHT frontal and LEFT posterior parietal.  No intracranial hemorrhage, mass lesion or evidence acute infarction.  No extra-axial fluid collections.   Atherosclerotic calcifications at the carotid siphons.  Bones and sinuses grossly unremarkable.  IMPRESSION: Old infarcts RIGHT basal ganglia, RIGHT frontal and LEFT posterior parietal.  Atrophy with small vessel chronic ischemic changes of deep cerebral white matter.  No acute intracranial abnormalities.  Critical Value/emergent results were called by telephone at the time of interpretation on 12/18/2014 at 0206 hr to Dr. [name], who verbally acknowledged these results.   Electronically Signed   By: Lavonia Dana M.D.   On: 12/18/2014 02:07   Mr [name] Head Wo Contrast  12/18/2014   CLINICAL DATA:  Increasing LEFT-sided weakness beginning yesterday, near fall. History of LEFT-sided hemi paresis, hypertension, atrial fibrillation, migraine, mild cognitive impairment.  EXAM: MRI HEAD WITHOUT CONTRAST  MRA HEAD WITHOUT CONTRAST  TECHNIQUE: Multiplanar, multiecho pulse sequences of the brain and surrounding structures were obtained without intravenous contrast. Angiographic images of the head were obtained using MRA technique without contrast.  COMPARISON:  CT head December 18, 2014 Set 1:51 a.m. and MRI of the head Aug 24, 2013  FINDINGS: MRI HEAD FINDINGS  No reduced diffusion to suggest acute ischemia. No susceptibility artifact to suggest hemorrhage. LEFT inferior parietal lobe encephalomalacia. RIGHT mesial frontal lobe encephalomalacia. Old cystic RIGHT basal ganglia lacunar infarct with mild ex vacuo dilatation of subjacent ventricle. Moderate to severe ventriculomegaly on the basis of global parenchymal brain volume loss. Old small RIGHT cerebellar infarcts. Confluent supratentorial white matter T2 hyperintense signal compatible with chronic small vessel ischemic disease without midline shift, mass effect or mass lesions.  No abnormal extra-axial fluid collections. Status post bilateral ocular lens implants. The paranasal sinuses and mastoid air cells are well aerated. No abnormal sellar expansion. No cerebellar  tonsillar ectopia. No suspicious calvarial bone marrow signal. Generalized bright T1 bone marrow signal compatible with osteopenia.  MRA HEAD FINDINGS  Anterior circulation: Normal flow related enhancement of the included cervical, petrous, cavernous and supra clinoid internal carotid arteries. Patent anterior communicating artery. Normal flow related enhancement of the anterior and middle cerebral arteries, including more distal segments. Moderate luminal regularity of the mid to distal MCA branches, similar to prior imaging, high-grade stenosis bilateral distal A2 segments, similar to progressed.  No large vessel occlusion, aneurysm.  Posterior circulation: Codominant vertebral arteries. Basilar artery is patent, with normal flow related enhancement of the main branch vessels. Normal flow related enhancement of the posterior cerebral arteries. Moderate stenosis LEFT distal P2 segment, unchanged.  No large vessel occlusion, abnormal luminal irregularity, aneurysm.  IMPRESSION: MRI HEAD: No acute intracranial process.  Chronic changes including: Old RIGHT ACA territory infarct. Old LEFT MCA versus watershed parietal infarct. Old RIGHT basal ganglia lacunar infarct.  MRA HEAD: No acute large vessel occlusion.  High-grade stenosis bilateral distal A2 segments, moderate stenosis LEFT P2 segment. Moderate luminal regularity of the mid to distal MCA branches; findings are compatible with intracranial atherosclerosis.   Electronically Signed   By: Elon Alas M.D.   On: 12/18/2014 04:52   Mr Brain Wo Contrast  12/18/2014  CLINICAL DATA:  Increasing LEFT-sided weakness beginning yesterday, near fall. History of LEFT-sided hemi paresis, hypertension, atrial fibrillation, migraine, mild cognitive impairment.  EXAM: MRI HEAD WITHOUT CONTRAST  MRA HEAD WITHOUT CONTRAST  TECHNIQUE: Multiplanar, multiecho pulse sequences of the brain and surrounding structures were obtained without intravenous contrast. Angiographic  images of the head were obtained using MRA technique without contrast.  COMPARISON:  CT head December 18, 2014 Set 1:51 a.m. and MRI of the head Aug 24, 2013  FINDINGS: MRI HEAD FINDINGS  No reduced diffusion to suggest acute ischemia. No susceptibility artifact to suggest hemorrhage. LEFT inferior parietal lobe encephalomalacia. RIGHT mesial frontal lobe encephalomalacia. Old cystic RIGHT basal ganglia lacunar infarct with mild ex vacuo dilatation of subjacent ventricle. Moderate to severe ventriculomegaly on the basis of global parenchymal brain volume loss. Old small RIGHT cerebellar infarcts. Confluent supratentorial white matter T2 hyperintense signal compatible with chronic small vessel ischemic disease without midline shift, mass effect or mass lesions.  No abnormal extra-axial fluid collections. Status post bilateral ocular lens implants. The paranasal sinuses and mastoid air cells are well aerated. No abnormal sellar expansion. No cerebellar tonsillar ectopia. No suspicious calvarial bone marrow signal. Generalized bright T1 bone marrow signal compatible with osteopenia.  MRA HEAD FINDINGS  Anterior circulation: Normal flow related enhancement of the included cervical, petrous, cavernous and supra clinoid internal carotid arteries. Patent anterior communicating artery. Normal flow related enhancement of the anterior and middle cerebral arteries, including more distal segments. Moderate luminal regularity of the mid to distal MCA branches, similar to prior imaging, high-grade stenosis bilateral distal A2 segments, similar to progressed.  No large vessel occlusion, aneurysm.  Posterior circulation: Codominant vertebral arteries. Basilar artery is patent, with normal flow related enhancement of the main branch vessels. Normal flow related enhancement of the posterior cerebral arteries. Moderate stenosis LEFT distal P2 segment, unchanged.  No large vessel occlusion, abnormal luminal irregularity, aneurysm.   IMPRESSION: MRI HEAD: No acute intracranial process.  Chronic changes including: Old RIGHT ACA territory infarct. Old LEFT MCA versus watershed parietal infarct. Old RIGHT basal ganglia lacunar infarct.  MRA HEAD: No acute large vessel occlusion.  High-grade stenosis bilateral distal A2 segments, moderate stenosis LEFT P2 segment. Moderate luminal regularity of the mid to distal MCA branches; findings are compatible with intracranial atherosclerosis.   Electronically Signed   By: Elon Alas M.D.   On: 12/18/2014 04:52   Ct Abdomen Pelvis W Contrast  12/18/2014   CLINICAL DATA:  Left lower quadrant pain.  EXAM: CT ABDOMEN AND PELVIS WITH CONTRAST  TECHNIQUE: Multidetector CT imaging of the abdomen and pelvis was performed using the standard protocol following bolus administration of intravenous contrast.  CONTRAST:  133mL OMNIPAQUE IOHEXOL 300 MG/ML  SOLN  COMPARISON:  CT scan dated 11/22/2013  FINDINGS: Lower chest: There is a 4 mm nodule in the right midzone, probably in the right upper lobe seen on image 5 of series 5. Heart size is normal. Focal area of atelectasis at the right lung base posteriorly.  Hepatobiliary: Normal.  Pancreas: Normal.  Spleen: Normal.  Adrenals/Urinary Tract: Stable 12 mm nodule in the right adrenal gland. Left adrenal gland is normal.  There is small amount of fluid in the left perinephric space, new. There are no renal calculi. The ureters are slightly prominent bilaterally but there is no obstruction. The bladder appears normal. Does the patient have any symptoms of urinary tract infection? If the patient have recently passed a stone? No findings of pyelonephritis in the renal  parenchyma.  There are 3 tiny cysts in the right kidney.  Stomach/Bowel: There are numerous diverticula in the sigmoid portion of the colon but there is no discrete diverticulitis. Appendix has been removed. Small bowel is normal.  Vascular/Lymphatic: Slight aortic atherosclerosis.  No adenopathy.   Other: Tiny amount of free fluid in the pelvis.  No free air.  Musculoskeletal: No acute abnormality. Multilevel degenerative disc and joint disease in the lumbar spine.  IMPRESSION: 1. Left perinephric fluid without renal or ureteral or bladder stone. No abnormal appearing parenchyma in the left kidney. I suspect the patient may have recently passed a stone. 2. Sigmoid diverticulosis without diverticulitis. 3. Aortic atherosclerosis. 4. 4 mm nodule in the right upper lobe. If the patient is at high risk for bronchogenic carcinoma, follow-up chest CT at 1 year is recommended. If the patient is at low risk, no follow-up is needed. This recommendation follows the consensus statement: Guidelines for Management of Small Pulmonary Nodules Detected on CT Scans: A Statement from the [facility] as published in Radiology 2005; 237:395-400. 5. Focal atelectasis at the right lung base.   Electronically Signed   By: Lorriane Shire M.D.   On: 12/18/2014 20:47   Dg Abd Portable 1v  12/18/2014   CLINICAL DATA:  Abdominal pain  EXAM: PORTABLE ABDOMEN - 1 VIEW  COMPARISON:  CT urogram of 11/22/2013  FINDINGS: A supine film of the abdomen shows no bowel obstruction. No radiographic evidence of constipation is seen. No opaque calculi are noted. There are degenerative changes in the lower lumbar spine noted.  IMPRESSION: No bowel obstruction. Degenerative change is present in the lower lumbar spine.   Electronically Signed   By: Ivar Drape M.D.   On: 12/18/2014 12:59    Scheduled Meds: . apixaban  2.5 mg Oral BID  . cefTRIAXone (ROCEPHIN)  IV  1 g Intravenous Q24H  . divalproex  250 mg Oral BID  . docusate sodium  100 mg Oral Daily  . oxybutynin  5 mg Oral BID   Continuous Infusions:   Principal Problem:   Left-sided weakness Active Problems:   Atrial flutter   HTN (hypertension)   Chronic anticoagulation   History of CVA (cerebrovascular accident)   Abdominal pain   Nausea without vomiting   UTI (lower  urinary tract infection)   Urinary tract infectious disease  Time spent: [place], Student-PA  [facility] If 7PM-7AM, please contact night-coverage at [email or web address], password [facility] 12/19/2014, 11:16 AM  LOS: 1 day    Attending MD note  Patient was seen, examined,treatment plan was discussed with the PA-S.  I have personally reviewed the clinical findings, lab, imaging studies and management of this patient in detail. I agree with the documentation, as recorded by thePA-S.   Admitted with left sided weakness-has chronic left-sided weakness from a prior CVA. MRI brain negative, left-sided weakness now back to usual baseline. Suspect most of her symptoms are related to UTI/dehydration. Continue IV antibiotics, disposition is to SNF when bed available   [facility]

## 2014-12-19 NOTE — Progress Notes (Signed)
Stroke Team Progress Note  HISTORY [name] is an 81 y.o. female with a past medical history that is relevant for HTN, hyperlipidemia, atrial fibrillation on apixaban, right hemispheric subcortical infarct in May 2014 with recent admission in May 2015 with new right corpus callosal embolic infarct with residual left hemiparesis, MCI amnestic type, brought in by EMS as a code stroke due to acute onset of the above stated symptoms. She is followed in the outpatient neurology setting by Dr Leonie Man, with last visit 10/29/14, and I reviewed the available neuro notes. Patient lives at home with her daughters and at baseline has chronic mild left sided weakness but able to ambulate with minimal assistance. Family called EMS because around [place] last night she was noted to have increasing weakness of the side side and had difficulty ambulating which almost make her fall. Patient denies HA, vertigo, double vision, difficulty swallowing, slurred speech, language or visual impairment. NIHSS 1 for left leg drift. CT brain was personally reviewed and showed no acute abnormality. Date last known well: 12/17/14 Time last known well: 1950 tPA Given: no, late presentation, apixaban, minimal deficits NIHSS: 1 MRS: 3    She was admitted to the neuro floor bed for further evaluation and treatment.  SUBJECTIVE Her daughters are at the bedside.  Overall she feels her condition is gradually improving.   She has been seen by therapy and recommended rehabilitation OBJECTIVE Most recent Vital Signs: Filed Vitals:   12/19/14 0109 12/19/14 0450 12/19/14 1008 12/19/14 1403  BP: 141/59 130/77 122/60 129/84  Pulse: 76  67 70  Temp: 97.9 F (36.6 C) 98 F (36.7 C) 97.9 F (36.6 C) 98 F (36.7 C)  TempSrc: Oral Oral Oral Oral  Resp: 18 18 18 18   Height:      Weight:      SpO2: 93% 93% 95% 96%   CBG (last 3)  No results for input(s): GLUCAP in the last 72 hours.  IV Fluid Intake:     MEDICATIONS  . apixaban   2.5 mg Oral BID  . cefTRIAXone (ROCEPHIN)  IV  1 g Intravenous Q24H  . divalproex  250 mg Oral BID  . docusate sodium  100 mg Oral Daily  . oxybutynin  5 mg Oral BID   PRN:  acetaminophen, diphenhydrAMINE **AND** acetaminophen, hydrALAZINE, HYDROcodone-acetaminophen, morphine injection, ondansetron (ZOFRAN) IV, senna-docusate  Diet:  DIET SOFT Room service appropriate?: Yes; Fluid consistency:: Thin  liquids Activity:  Bedrest    DVT Prophylaxis:  apixaban  CLINICALLY SIGNIFICANT STUDIES Basic Metabolic Panel:   Recent Labs Lab 12/18/14 0144 12/18/14 0151 12/19/14 0433  NA 140 141 138  K 4.5 4.4 3.9  CL 104 103 102  CO2 27  --  31  GLUCOSE 99 98 106*  BUN 20 22* 16  CREATININE 1.03* 1.00 0.95  CALCIUM 9.2  --  8.4*   Liver Function Tests:   Recent Labs Lab 12/18/14 0144  AST 23  ALT 9*  ALKPHOS 55  BILITOT 0.4  PROT 5.7*  ALBUMIN 3.3*   CBC:   Recent Labs Lab 12/18/14 0144 12/18/14 0151  WBC 4.9  --   NEUTROABS 2.2  --   HGB 12.9 13.6  HCT 40.1 40.0  MCV 94.1  --   PLT 173  --    Coagulation:   Recent Labs Lab 12/18/14 0144  LABPROT 14.6  INR 1.12   Cardiac Enzymes: No results for input(s): CKTOTAL, CKMB, CKMBINDEX, TROPONINI in the last 168 hours. Urinalysis:  Recent Labs Lab 12/18/14 0225  COLORURINE YELLOW  LABSPEC 1.012  PHURINE 7.5  GLUCOSEU NEGATIVE  HGBUR SMALL*  BILIRUBINUR NEGATIVE  KETONESUR NEGATIVE  PROTEINUR NEGATIVE  UROBILINOGEN 0.2  NITRITE NEGATIVE  LEUKOCYTESUR LARGE*   Lipid Panel    Component Value Date/Time   CHOL 147 12/19/2014 0433   TRIG 64 12/19/2014 0433   HDL 47 12/19/2014 0433   CHOLHDL 3.1 12/19/2014 0433   VLDL 13 12/19/2014 0433   LDLCALC 87 12/19/2014 0433   HgbA1C  Lab Results  Component Value Date   HGBA1C 5.8* 08/28/2013    Urine Drug Screen:      Component Value Date/Time   LABOPIA NONE DETECTED 12/18/2014 0225   COCAINSCRNUR NONE DETECTED 12/18/2014 0225   LABBENZ NONE DETECTED  12/18/2014 0225   AMPHETMU NONE DETECTED 12/18/2014 0225   THCU NONE DETECTED 12/18/2014 0225   LABBARB NONE DETECTED 12/18/2014 0225    Alcohol Level:   Recent Labs Lab 12/18/14 0144  ETH <5    Ct Head Wo Contrast  12/18/2014   CLINICAL DATA:  Code stroke, left-sided weakness, history of strokes, hypertension, atrial fibrillation, former smoker  EXAM: CT HEAD WITHOUT CONTRAST  TECHNIQUE: Contiguous axial images were obtained from the base of the skull through the vertex without intravenous contrast.  COMPARISON:  08/24/2013  FINDINGS: Generalized atrophy.  Normal ventricular morphology.  No midline shift or mass effect.  Extensive small vessel chronic ischemic changes of deep cerebral white matter.  Old lacunar infarct RIGHT basal ganglia.  Old cortical infarcts RIGHT frontal and LEFT posterior parietal.  No intracranial hemorrhage, mass lesion or evidence acute infarction.  No extra-axial fluid collections.  Atherosclerotic calcifications at the carotid siphons.  Bones and sinuses grossly unremarkable.  IMPRESSION: Old infarcts RIGHT basal ganglia, RIGHT frontal and LEFT posterior parietal.  Atrophy with small vessel chronic ischemic changes of deep cerebral white matter.  No acute intracranial abnormalities.  Critical Value/emergent results were called by telephone at the time of interpretation on 12/18/2014 at 0206 hr to Dr. [name], who verbally acknowledged these results.   Electronically Signed   By: Lavonia Dana M.D.   On: 12/18/2014 02:07   Mr [name] Head Wo Contrast  12/18/2014   CLINICAL DATA:  Increasing LEFT-sided weakness beginning yesterday, near fall. History of LEFT-sided hemi paresis, hypertension, atrial fibrillation, migraine, mild cognitive impairment.  EXAM: MRI HEAD WITHOUT CONTRAST  MRA HEAD WITHOUT CONTRAST  TECHNIQUE: Multiplanar, multiecho pulse sequences of the brain and surrounding structures were obtained without intravenous contrast. Angiographic images of the head were  obtained using MRA technique without contrast.  COMPARISON:  CT head December 18, 2014 Set 1:51 a.m. and MRI of the head Aug 24, 2013  FINDINGS: MRI HEAD FINDINGS  No reduced diffusion to suggest acute ischemia. No susceptibility artifact to suggest hemorrhage. LEFT inferior parietal lobe encephalomalacia. RIGHT mesial frontal lobe encephalomalacia. Old cystic RIGHT basal ganglia lacunar infarct with mild ex vacuo dilatation of subjacent ventricle. Moderate to severe ventriculomegaly on the basis of global parenchymal brain volume loss. Old small RIGHT cerebellar infarcts. Confluent supratentorial white matter T2 hyperintense signal compatible with chronic small vessel ischemic disease without midline shift, mass effect or mass lesions.  No abnormal extra-axial fluid collections. Status post bilateral ocular lens implants. The paranasal sinuses and mastoid air cells are well aerated. No abnormal sellar expansion. No cerebellar tonsillar ectopia. No suspicious calvarial bone marrow signal. Generalized bright T1 bone marrow signal compatible with osteopenia.  MRA HEAD FINDINGS  Anterior circulation:  Normal flow related enhancement of the included cervical, petrous, cavernous and supra clinoid internal carotid arteries. Patent anterior communicating artery. Normal flow related enhancement of the anterior and middle cerebral arteries, including more distal segments. Moderate luminal regularity of the mid to distal MCA branches, similar to prior imaging, high-grade stenosis bilateral distal A2 segments, similar to progressed.  No large vessel occlusion, aneurysm.  Posterior circulation: Codominant vertebral arteries. Basilar artery is patent, with normal flow related enhancement of the main branch vessels. Normal flow related enhancement of the posterior cerebral arteries. Moderate stenosis LEFT distal P2 segment, unchanged.  No large vessel occlusion, abnormal luminal irregularity, aneurysm.  IMPRESSION: MRI HEAD: No  acute intracranial process.  Chronic changes including: Old RIGHT ACA territory infarct. Old LEFT MCA versus watershed parietal infarct. Old RIGHT basal ganglia lacunar infarct.  MRA HEAD: No acute large vessel occlusion.  High-grade stenosis bilateral distal A2 segments, moderate stenosis LEFT P2 segment. Moderate luminal regularity of the mid to distal MCA branches; findings are compatible with intracranial atherosclerosis.   Electronically Signed   By: Elon Alas M.D.   On: 12/18/2014 04:52   Mr Brain Wo Contrast  12/18/2014   CLINICAL DATA:  Increasing LEFT-sided weakness beginning yesterday, near fall. History of LEFT-sided hemi paresis, hypertension, atrial fibrillation, migraine, mild cognitive impairment.  EXAM: MRI HEAD WITHOUT CONTRAST  MRA HEAD WITHOUT CONTRAST  TECHNIQUE: Multiplanar, multiecho pulse sequences of the brain and surrounding structures were obtained without intravenous contrast. Angiographic images of the head were obtained using MRA technique without contrast.  COMPARISON:  CT head December 18, 2014 Set 1:51 a.m. and MRI of the head Aug 24, 2013  FINDINGS: MRI HEAD FINDINGS  No reduced diffusion to suggest acute ischemia. No susceptibility artifact to suggest hemorrhage. LEFT inferior parietal lobe encephalomalacia. RIGHT mesial frontal lobe encephalomalacia. Old cystic RIGHT basal ganglia lacunar infarct with mild ex vacuo dilatation of subjacent ventricle. Moderate to severe ventriculomegaly on the basis of global parenchymal brain volume loss. Old small RIGHT cerebellar infarcts. Confluent supratentorial white matter T2 hyperintense signal compatible with chronic small vessel ischemic disease without midline shift, mass effect or mass lesions.  No abnormal extra-axial fluid collections. Status post bilateral ocular lens implants. The paranasal sinuses and mastoid air cells are well aerated. No abnormal sellar expansion. No cerebellar tonsillar ectopia. No suspicious calvarial  bone marrow signal. Generalized bright T1 bone marrow signal compatible with osteopenia.  MRA HEAD FINDINGS  Anterior circulation: Normal flow related enhancement of the included cervical, petrous, cavernous and supra clinoid internal carotid arteries. Patent anterior communicating artery. Normal flow related enhancement of the anterior and middle cerebral arteries, including more distal segments. Moderate luminal regularity of the mid to distal MCA branches, similar to prior imaging, high-grade stenosis bilateral distal A2 segments, similar to progressed.  No large vessel occlusion, aneurysm.  Posterior circulation: Codominant vertebral arteries. Basilar artery is patent, with normal flow related enhancement of the main branch vessels. Normal flow related enhancement of the posterior cerebral arteries. Moderate stenosis LEFT distal P2 segment, unchanged.  No large vessel occlusion, abnormal luminal irregularity, aneurysm.  IMPRESSION: MRI HEAD: No acute intracranial process.  Chronic changes including: Old RIGHT ACA territory infarct. Old LEFT MCA versus watershed parietal infarct. Old RIGHT basal ganglia lacunar infarct.  MRA HEAD: No acute large vessel occlusion.  High-grade stenosis bilateral distal A2 segments, moderate stenosis LEFT P2 segment. Moderate luminal regularity of the mid to distal MCA branches; findings are compatible with intracranial atherosclerosis.   Electronically Signed  By: Elon Alas M.D.   On: 12/18/2014 04:52   Ct Abdomen Pelvis W Contrast  12/18/2014   CLINICAL DATA:  Left lower quadrant pain.  EXAM: CT ABDOMEN AND PELVIS WITH CONTRAST  TECHNIQUE: Multidetector CT imaging of the abdomen and pelvis was performed using the standard protocol following bolus administration of intravenous contrast.  CONTRAST:  167mL OMNIPAQUE IOHEXOL 300 MG/ML  SOLN  COMPARISON:  CT scan dated 11/22/2013  FINDINGS: Lower chest: There is a 4 mm nodule in the right midzone, probably in the right  upper lobe seen on image 5 of series 5. Heart size is normal. Focal area of atelectasis at the right lung base posteriorly.  Hepatobiliary: Normal.  Pancreas: Normal.  Spleen: Normal.  Adrenals/Urinary Tract: Stable 12 mm nodule in the right adrenal gland. Left adrenal gland is normal.  There is small amount of fluid in the left perinephric space, new. There are no renal calculi. The ureters are slightly prominent bilaterally but there is no obstruction. The bladder appears normal. Does the patient have any symptoms of urinary tract infection? If the patient have recently passed a stone? No findings of pyelonephritis in the renal parenchyma.  There are 3 tiny cysts in the right kidney.  Stomach/Bowel: There are numerous diverticula in the sigmoid portion of the colon but there is no discrete diverticulitis. Appendix has been removed. Small bowel is normal.  Vascular/Lymphatic: Slight aortic atherosclerosis.  No adenopathy.  Other: Tiny amount of free fluid in the pelvis.  No free air.  Musculoskeletal: No acute abnormality. Multilevel degenerative disc and joint disease in the lumbar spine.  IMPRESSION: 1. Left perinephric fluid without renal or ureteral or bladder stone. No abnormal appearing parenchyma in the left kidney. I suspect the patient may have recently passed a stone. 2. Sigmoid diverticulosis without diverticulitis. 3. Aortic atherosclerosis. 4. 4 mm nodule in the right upper lobe. If the patient is at high risk for bronchogenic carcinoma, follow-up chest CT at 1 year is recommended. If the patient is at low risk, no follow-up is needed. This recommendation follows the consensus statement: Guidelines for Management of Small Pulmonary Nodules Detected on CT Scans: A Statement from the [place] as published in Radiology 2005; 237:395-400. 5. Focal atelectasis at the right lung base.   Electronically Signed   By: Lorriane Shire M.D.   On: 12/18/2014 20:47   Dg Abd Portable 1v  12/18/2014    CLINICAL DATA:  Abdominal pain  EXAM: PORTABLE ABDOMEN - 1 VIEW  COMPARISON:  CT urogram of 11/22/2013  FINDINGS: A supine film of the abdomen shows no bowel obstruction. No radiographic evidence of constipation is seen. No opaque calculi are noted. There are degenerative changes in the lower lumbar spine noted.  IMPRESSION: No bowel obstruction. Degenerative change is present in the lower lumbar spine.   Electronically Signed   By: Ivar Drape M.D.   On: 12/18/2014 12:59       Carotid Doppler  pending  2D Echocardiogram   Not ordered  CXR  10/30/14 Hyperinflation and cardiomegaly, unchanged. No acute cardiopulmonary findings  EKG  Atrial flutter Anteroseptal infarct, old No significant change since last tracing Therapy Recommendations pending  Physical Exam   Frail elderly Caucasian lady currently not in distress. . Afebrile. Head is nontraumatic. Neck is supple without bruit.    Cardiac exam no murmur or gallop. Lungs are clear to auscultation. Distal pulses are well felt. Neurological Exam ;  Mental Status: Alert, oriented, thought content appropriate. Speech  fluent without evidence of aphasia. Able to follow 3 step commands without difficulty. Cranial Nerves: II: Discs flat bilaterally; Visual fields grossly normal, pupils equal, round, reactive to light and accommodation III,IV, VI: ptosis not present, extra-ocular motions intact bilaterally V,VII: smile symmetric, facial light touch sensation normal bilaterally VIII: hearing normal bilaterally IX,X: uvula rises symmetrically XI: bilateral shoulder shrug XII: midline tongue extension without atrophy or fasciculations Motor: Mild left leg drift. Tone and bulk:normal tone throughout; no atrophy noted Sensory: Pinprick and light touch intact throughout, bilaterally Deep Tendon Reflexes:  1 all over Plantars: Right: downgoingLeft: downgoing Cerebellar: normal finger-to-nose, normal  heel-to-shin test Gait: Not tested ASSESSMENT [name] is a 81 y.o. female presenting with  worsening of left hemiparesis and difficulty walking in setting of UTI and dehydration. MRI scan shows no acute infarct. On apixaban prior to admission. Now on apixaban for secondary stroke prevention. Patient with resultant  mild old left hemiparesis which is unchanged from baseline. Stroke work up underway.   Chronic atrial flutter on long-term anticoagulation with Apixaban  LDL  132   Mild cognitive impairment   Hospital day # 1  TREATMENT/PLAN Recommend continued hyperexpansion. Treat UTI. Transfer to [facility] rehabilitation. Discussed with patient and daughter and answered questions..I have personally examined this patient, reviewed notes, independently viewed imaging studies, participated in medical decision making and plan of care. I have made any additions or clarifications directly to the above note. Stroke team will sign off. Currently call for questions. Follow-up as an outpatient in stroke clinic  Antony Contras, [place] Pager: [phone] 12/19/2014 3:14 PM    SIGNED    To contact Stroke Continuity provider, please refer to [email or web address] After hours, contact General Neurology

## 2014-12-20 DIAGNOSIS — R109 Unspecified abdominal pain: Secondary | ICD-10-CM

## 2014-12-20 DIAGNOSIS — N3000 Acute cystitis without hematuria: Secondary | ICD-10-CM

## 2014-12-20 LAB — HEMOGLOBIN A1C
HEMOGLOBIN A1C: 6.2 % — AB (ref 4.8–5.6)
MEAN PLASMA GLUCOSE: 131 mg/dL

## 2014-12-20 MED ORDER — PANTOPRAZOLE SODIUM 40 MG PO TBEC: 40.0000 mg | DELAYED_RELEASE_TABLET | Freq: Every day | ORAL | Status: DC

## 2014-12-20 MED ORDER — PANTOPRAZOLE SODIUM 40 MG PO TBEC
40.0000 mg | DELAYED_RELEASE_TABLET | Freq: Every day | ORAL | Status: DC
  Administered 2014-12-20: 40 mg via ORAL
  Filled 2014-12-20 (×2): qty 1

## 2014-12-20 MED ORDER — AMLODIPINE BESYLATE 5 MG PO TABS
5.0000 mg | ORAL_TABLET | Freq: Every day | ORAL | Status: DC
  Administered 2014-12-20 – 2014-12-21 (×2): 5 mg via ORAL
  Filled 2014-12-20 (×2): qty 1

## 2014-12-20 MED ORDER — AMLODIPINE BESYLATE 5 MG PO TABS: 5.0000 mg | ORAL_TABLET | Freq: Every day | ORAL | Status: DC

## 2014-12-20 NOTE — Progress Notes (Signed)
[facility] PROGRESS NOTE  [name] ZES:[id number] DOB: March 17, 1925 DOA: 12/18/2014 PCP: [name], DO  Subjective: Awake and alert, speech is clear and appropriate. Still with improved strength in LUE. Still with some lingering nocturia. Patient reports 30 min of CP around 11 AM that has since resolved. No N/V/D, constipation, or SOB.   Assessment/Plan: Urinary Tract Infection: Patient presented with worsening LUE weakness, confusion, and LLQ abdominal pain, U/A positive for large leukocytes. Symptoms resolving on IV ceftriaxone. Afebrile with no leukocytosis. Continue abx.   ?TIA: Patient has had left arm weakness since prior CVA. Likely acutely worsened in the setting of UTI. Improvement seen with initiation of IV abx - left arm weakness now back to usual baseline. MRI brain shows no acute infarcts. Recent echo shows preserved EF and no embolic foci, and patient taking Eliquis for her A fib. Carotid doppler pending. LDL near goal at 87- not on statin d/t intolerance. HgA1c 6.2.   Chest Pain: Likely atypical, reports 30 min of b/l chest discomfort this morning that has since resolved. EKG unchanged.   HTN: BPs elevated, continue hydralazine as needed for systolic BP >263 and add amlodipine  Hyperlipidemia: LDL sub-optimal at 87, continue management with diet and exercise. Patient's daughters report intolerance to statins.    Atrial Fibrillation: On eliquis. Rate well controlled  Dementia: Tried Aricept - intolerant due to GI side effects. Continue Depakote.  DVT Prophylaxis: on Eliquis, SCDs Code Status: Partial Family Communication: 3 daughters at bedside  Disposition Plan: D/c to SNF when bed available  Consultants:  Neurology  Procedures:  Echocardiogram, carotid doppler  Antibiotics:   Ceftriaxone 12/19/2014 >>  Objective: Filed Vitals:   12/20/14 1019  BP: 175/95  Pulse: 83  Temp: 98.7 F (37.1 C)  Resp: 20   No intake or output data in the 24 hours  ending 12/20/14 1200 Filed Weights   12/18/14 0213 12/18/14 0543  Weight: 58.968 kg (130 lb) 59.784 kg (131 lb 12.8 oz)    Exam:  General:  NAD, awake and alert  Head: Atraumatic  Neck: Supple, no LAD  Cardiovascular: RRR, no m/r/g. No LE edema  Respiratory: CTAB, no wheezes  Abdomen: Soft, non distended, +BS  Musculoskeletal: Full ROM in all 4   Neurologic: No focal deficits  Data Reviewed: Basic Metabolic Panel:  Recent Labs Lab 12/18/14 0144 12/18/14 0151 12/19/14 0433  NA 140 141 138  K 4.5 4.4 3.9  CL 104 103 102  CO2 27  --  31  GLUCOSE 99 98 106*  BUN 20 22* 16  CREATININE 1.03* 1.00 0.95  CALCIUM 9.2  --  8.4*   Liver Function Tests:  Recent Labs Lab 12/18/14 0144  AST 23  ALT 9*  ALKPHOS 55  BILITOT 0.4  PROT 5.7*  ALBUMIN 3.3*   No results for input(s): LIPASE, AMYLASE in the last 168 hours. No results for input(s): AMMONIA in the last 168 hours. CBC:  Recent Labs Lab 12/18/14 0144 12/18/14 0151  WBC 4.9  --   NEUTROABS 2.2  --   HGB 12.9 13.6  HCT 40.1 40.0  MCV 94.1  --   PLT 173  --    Cardiac Enzymes: No results for input(s): CKTOTAL, CKMB, CKMBINDEX, TROPONINI in the last 168 hours. BNP (last 3 results) No results for input(s): BNP in the last 8760 hours.  ProBNP (last 3 results) No results for input(s): PROBNP in the last 8760 hours.  CBG: No results for input(s): GLUCAP in the last  168 hours.  Recent Results (from the past 240 hour(s))  Culture, Urine     Status: None   Collection Time: 12/18/14  2:25 AM  Result Value Ref Range Status   Specimen Description URINE, RANDOM  Final   Special Requests ADDED [id number]  Final   Culture MULTIPLE SPECIES PRESENT, SUGGEST RECOLLECTION  Final   Report Status 12/19/2014 FINAL  Final    Studies: Ct Abdomen Pelvis W Contrast  12/18/2014   CLINICAL DATA:  Left lower quadrant pain.  EXAM: CT ABDOMEN AND PELVIS WITH CONTRAST  TECHNIQUE: Multidetector CT imaging of the abdomen  and pelvis was performed using the standard protocol following bolus administration of intravenous contrast.  CONTRAST:  139mL OMNIPAQUE IOHEXOL 300 MG/ML  SOLN  COMPARISON:  CT scan dated 11/22/2013  FINDINGS: Lower chest: There is a 4 mm nodule in the right midzone, probably in the right upper lobe seen on image 5 of series 5. Heart size is normal. Focal area of atelectasis at the right lung base posteriorly.  Hepatobiliary: Normal.  Pancreas: Normal.  Spleen: Normal.  Adrenals/Urinary Tract: Stable 12 mm nodule in the right adrenal gland. Left adrenal gland is normal.  There is small amount of fluid in the left perinephric space, new. There are no renal calculi. The ureters are slightly prominent bilaterally but there is no obstruction. The bladder appears normal. Does the patient have any symptoms of urinary tract infection? If the patient have recently passed a stone? No findings of pyelonephritis in the renal parenchyma.  There are 3 tiny cysts in the right kidney.  Stomach/Bowel: There are numerous diverticula in the sigmoid portion of the colon but there is no discrete diverticulitis. Appendix has been removed. Small bowel is normal.  Vascular/Lymphatic: Slight aortic atherosclerosis.  No adenopathy.  Other: Tiny amount of free fluid in the pelvis.  No free air.  Musculoskeletal: No acute abnormality. Multilevel degenerative disc and joint disease in the lumbar spine.  IMPRESSION: 1. Left perinephric fluid without renal or ureteral or bladder stone. No abnormal appearing parenchyma in the left kidney. I suspect the patient may have recently passed a stone. 2. Sigmoid diverticulosis without diverticulitis. 3. Aortic atherosclerosis. 4. 4 mm nodule in the right upper lobe. If the patient is at high risk for bronchogenic carcinoma, follow-up chest CT at 1 year is recommended. If the patient is at low risk, no follow-up is needed. This recommendation follows the consensus statement: Guidelines for Management of  Small Pulmonary Nodules Detected on CT Scans: A Statement from the [place] as published in Radiology 2005; 237:395-400. 5. Focal atelectasis at the right lung base.   Electronically Signed   By: Lorriane Shire M.D.   On: 12/18/2014 20:47   Scheduled Meds: . apixaban  2.5 mg Oral BID  . cefTRIAXone (ROCEPHIN)  IV  1 g Intravenous Q24H  . divalproex  250 mg Oral BID  . docusate sodium  100 mg Oral Daily  . oxybutynin  5 mg Oral BID   Continuous Infusions:   Principal Problem:   Left-sided weakness Active Problems:   Atrial flutter   HTN (hypertension)   Chronic anticoagulation   History of CVA (cerebrovascular accident)   Abdominal pain   Nausea without vomiting   UTI (lower urinary tract infection)   Urinary tract infectious disease  Time spent: [place], Student-PA   [facility] If 7PM-7AM, please contact night-coverage at [email or web address], password [facility] 12/20/2014, 12:00 PM  LOS: 2 days  Attending MD note  Patient was seen, examined,treatment plan was discussed with the /PA-S.  I have personally reviewed the clinical findings, lab, imaging studies and management of this patient in detail. I agree with the documentation, as recorded by the PA-S.   Doing well, no major complaints this morning. Had very brief retrosternal discomfort that I suspect is mostly GI related. EKG is unchanged. Urine culture shows multiple bacterial morphotypes-will not repeat-as already on IV antibiotics. We plan only 3 days of IV antibiotics. Suspect stable for discharge on 9/24.   [facility]

## 2014-12-20 NOTE — Clinical Social Work Note (Signed)
Clinical Social Worker contacted pt's dtr, Kim to present bed offers. Offers were NOT extended from [facility] or [facility].   Pt's dtr to discuss options with other siblings and contact CSW with decision.  FL-2 on chart for MD signature. CSW remains available as needed.   Glendon Axe, MSW, LCSWA [phone] 12/20/2014 10:38 AM

## 2014-12-20 NOTE — Progress Notes (Signed)
SLP Cancellation Note  Patient Details Name: [name] MRN: [id number] DOB: 1925-01-01   Cancelled evaluation:  Pt scheduled for D/C tomorrow to SNF.  Her daughter, Kit, reports no changes in speech, communication, or swallow.  Pt sleeping upon visit.  Will defer evaluation to next venue of care.  Dtr in agreement.         Juan Quam Laurice 12/20/2014, 2:15 PM

## 2014-12-20 NOTE — Discharge Summary (Addendum)
PATIENT DETAILS Name: [name] Age: 81 y.o. Sex: female Date of Birth: 06-Jan-1925 MRN: [id number]. Admitting Physician: Toni Blaze, MD TM[facility]:[id number], [place], [place]  Admit Date: 12/18/2014 Discharge date: 12/21/2014  Recommendations for Outpatient Follow-up:  1. Continue to optimize blood pressure regimen. 2. Please repeat CBC/BMET in next 2-3 weeks.   PRIMARY DISCHARGE DIAGNOSIS:  Principal Problem:   Left-sided weakness Active Problems:   Atrial flutter   HTN (hypertension)   Chronic anticoagulation   History of CVA (cerebrovascular accident)   Abdominal pain   Nausea without vomiting   UTI (lower urinary tract infection)   Urinary tract infectious disease      PAST MEDICAL HISTORY: Past Medical History  Diagnosis Date  . Arthritis   . Hypertension   . Migraine   . Colon polyp   . Hearing difficulty   . Torus palatinus   . Atrial flutter January, 2012  . Stroke 08/02/12     right lenticular nucleus infarct  . Left leg weakness 02/05/2013  . Chronic anticoagulation   . High cholesterol   . Atrial fibrillation     on Eliquis Rx  . Mild cognitive impairment with memory loss   . Malnutrition   . TIA (transient ischemic attack)   . Constipation   . Osteoporosis   . Hemorrhoids     DISCHARGE MEDICATIONS: Current Discharge Medication List    START taking these medications   Details  amLODipine (NORVASC) 5 MG tablet Take 1 tablet (5 mg total) by mouth daily.    pantoprazole (PROTONIX) 40 MG tablet Take 1 tablet (40 mg total) by mouth daily at 12 noon.      CONTINUE these medications which have NOT CHANGED   Details  acetaminophen (TYLENOL) 500 MG tablet Take 1,000 mg by mouth every 8 (eight) hours as needed for mild pain or headache.     apixaban (ELIQUIS) 2.5 MG TABS tablet TAKE 1 TABLET (2.5 MG TOTAL) BY MOUTH 2 (TWO) TIMES DAILY. Qty: 180 tablet, Refills: 0    b complex vitamins tablet Take 1 tablet by mouth 2 (two) times daily.     Calcium  Carb-Cholecalciferol (CALCIUM 600 + D) 600-200 MG-UNIT TABS Take 1 tablet by mouth 2 (two) times daily.    Cholecalciferol (VITAMIN D) 2000 UNITS tablet Take 2,000 Units by mouth daily.    diphenhydramine-acetaminophen (TYLENOL PM) 25-500 MG TABS Take 1 tablet by mouth at bedtime as needed (sleep).     divalproex (DEPAKOTE) 125 MG DR tablet Take 2 tablets (250 mg total) by mouth 2 (two) times daily. Qty: 540 tablet, Refills: 1    Docusate Sodium (COLACE PO) Take by mouth daily.     Melatonin 3 MG TABS Take 1 tablet by mouth at bedtime    Multiple Vitamin (MULTIVITAMIN WITH MINERALS) TABS Take 1 tablet by mouth daily.    ondansetron (ZOFRAN) 4 MG tablet Take 1 tablet (4 mg total) by mouth every 6 (six) hours as needed for nausea. Qty: 10 tablet, Refills: 0    oxybutynin (DITROPAN) 5 MG tablet Take 1 tablet (5 mg total) by mouth 2 (two) times daily. Qty: 60 tablet, Refills: 2   Associated Diagnoses: Urge incontinence of urine        ALLERGIES:   Allergies  Allergen Reactions  . Aricept [Donepezil Hcl] Hypertension    With N/V  . Tramadol Hcl Other (See Comments)     lethargy, nausea  . Alendronate Sodium Rash and Other (See Comments)    puffy  eyes    BRIEF HPI:  See H&P, Labs, Consult and Test reports for all details in brief, patient is a 81 year old female with prior history of CVA on Eliquis who presented with worsening of her chronic left-sided weakness.   CONSULTATIONS:   neurology  PERTINENT RADIOLOGIC STUDIES: Ct Head Wo Contrast  12/18/2014   CLINICAL DATA:  Code stroke, left-sided weakness, history of strokes, hypertension, atrial fibrillation, former smoker  EXAM: CT HEAD WITHOUT CONTRAST  TECHNIQUE: Contiguous axial images were obtained from the base of the skull through the vertex without intravenous contrast.  COMPARISON:  08/24/2013  FINDINGS: Generalized atrophy.  Normal ventricular morphology.  No midline shift or mass effect.  Extensive small vessel chronic  ischemic changes of deep cerebral white matter.  Old lacunar infarct RIGHT basal ganglia.  Old cortical infarcts RIGHT frontal and LEFT posterior parietal.  No intracranial hemorrhage, mass lesion or evidence acute infarction.  No extra-axial fluid collections.  Atherosclerotic calcifications at the carotid siphons.  Bones and sinuses grossly unremarkable.  IMPRESSION: Old infarcts RIGHT basal ganglia, RIGHT frontal and LEFT posterior parietal.  Atrophy with small vessel chronic ischemic changes of deep cerebral white matter.  No acute intracranial abnormalities.  Critical Value/emergent results were called by telephone at the time of interpretation on 12/18/2014 at 0206 hr to Dr. Armida Gregory, who verbally acknowledged these results.   Electronically Signed   By: [name] M.D.   On: 12/18/2014 02:07   Mr [name] Head Wo Contrast  12/18/2014   CLINICAL DATA:  Increasing LEFT-sided weakness beginning yesterday, near fall. History of LEFT-sided hemi paresis, hypertension, atrial fibrillation, migraine, mild cognitive impairment.  EXAM: MRI HEAD WITHOUT CONTRAST  MRA HEAD WITHOUT CONTRAST  TECHNIQUE: Multiplanar, multiecho pulse sequences of the brain and surrounding structures were obtained without intravenous contrast. Angiographic images of the head were obtained using MRA technique without contrast.  COMPARISON:  CT head December 18, 2014 Set 1:51 a.m. and MRI of the head Aug 24, 2013  FINDINGS: MRI HEAD FINDINGS  No reduced diffusion to suggest acute ischemia. No susceptibility artifact to suggest hemorrhage. LEFT inferior parietal lobe encephalomalacia. RIGHT mesial frontal lobe encephalomalacia. Old cystic RIGHT basal ganglia lacunar infarct with mild ex vacuo dilatation of subjacent ventricle. Moderate to severe ventriculomegaly on the basis of global parenchymal brain volume loss. Old small RIGHT cerebellar infarcts. Confluent supratentorial white matter T2 hyperintense signal compatible with chronic small vessel  ischemic disease without midline shift, mass effect or mass lesions.  No abnormal extra-axial fluid collections. Status post bilateral ocular lens implants. The paranasal sinuses and mastoid air cells are well aerated. No abnormal sellar expansion. No cerebellar tonsillar ectopia. No suspicious calvarial bone marrow signal. Generalized bright T1 bone marrow signal compatible with osteopenia.  MRA HEAD FINDINGS  Anterior circulation: Normal flow related enhancement of the included cervical, petrous, cavernous and supra clinoid internal carotid arteries. Patent anterior communicating artery. Normal flow related enhancement of the anterior and middle cerebral arteries, including more distal segments. Moderate luminal regularity of the mid to distal MCA branches, similar to prior imaging, high-grade stenosis bilateral distal A2 segments, similar to progressed.  No large vessel occlusion, aneurysm.  Posterior circulation: Codominant vertebral arteries. Basilar artery is patent, with normal flow related enhancement of the main branch vessels. Normal flow related enhancement of the posterior cerebral arteries. Moderate stenosis LEFT distal P2 segment, unchanged.  No large vessel occlusion, abnormal luminal irregularity, aneurysm.  IMPRESSION: MRI HEAD: No acute intracranial process.  Chronic changes including: Old RIGHT  ACA territory infarct. Old LEFT MCA versus watershed parietal infarct. Old RIGHT basal ganglia lacunar infarct.  MRA HEAD: No acute large vessel occlusion.  High-grade stenosis bilateral distal A2 segments, moderate stenosis LEFT P2 segment. Moderate luminal regularity of the mid to distal MCA branches; findings are compatible with intracranial atherosclerosis.   Electronically Signed   By: Elon Alas M.D.   On: 12/18/2014 04:52   Mr Brain Wo Contrast  12/18/2014   CLINICAL DATA:  Increasing LEFT-sided weakness beginning yesterday, near fall. History of LEFT-sided hemi paresis, hypertension,  atrial fibrillation, migraine, mild cognitive impairment.  EXAM: MRI HEAD WITHOUT CONTRAST  MRA HEAD WITHOUT CONTRAST  TECHNIQUE: Multiplanar, multiecho pulse sequences of the brain and surrounding structures were obtained without intravenous contrast. Angiographic images of the head were obtained using MRA technique without contrast.  COMPARISON:  CT head December 18, 2014 Set 1:51 a.m. and MRI of the head Aug 24, 2013  FINDINGS: MRI HEAD FINDINGS  No reduced diffusion to suggest acute ischemia. No susceptibility artifact to suggest hemorrhage. LEFT inferior parietal lobe encephalomalacia. RIGHT mesial frontal lobe encephalomalacia. Old cystic RIGHT basal ganglia lacunar infarct with mild ex vacuo dilatation of subjacent ventricle. Moderate to severe ventriculomegaly on the basis of global parenchymal brain volume loss. Old small RIGHT cerebellar infarcts. Confluent supratentorial white matter T2 hyperintense signal compatible with chronic small vessel ischemic disease without midline shift, mass effect or mass lesions.  No abnormal extra-axial fluid collections. Status post bilateral ocular lens implants. The paranasal sinuses and mastoid air cells are well aerated. No abnormal sellar expansion. No cerebellar tonsillar ectopia. No suspicious calvarial bone marrow signal. Generalized bright T1 bone marrow signal compatible with osteopenia.  MRA HEAD FINDINGS  Anterior circulation: Normal flow related enhancement of the included cervical, petrous, cavernous and supra clinoid internal carotid arteries. Patent anterior communicating artery. Normal flow related enhancement of the anterior and middle cerebral arteries, including more distal segments. Moderate luminal regularity of the mid to distal MCA branches, similar to prior imaging, high-grade stenosis bilateral distal A2 segments, similar to progressed.  No large vessel occlusion, aneurysm.  Posterior circulation: Codominant vertebral arteries. Basilar artery is  patent, with normal flow related enhancement of the main branch vessels. Normal flow related enhancement of the posterior cerebral arteries. Moderate stenosis LEFT distal P2 segment, unchanged.  No large vessel occlusion, abnormal luminal irregularity, aneurysm.  IMPRESSION: MRI HEAD: No acute intracranial process.  Chronic changes including: Old RIGHT ACA territory infarct. Old LEFT MCA versus watershed parietal infarct. Old RIGHT basal ganglia lacunar infarct.  MRA HEAD: No acute large vessel occlusion.  High-grade stenosis bilateral distal A2 segments, moderate stenosis LEFT P2 segment. Moderate luminal regularity of the mid to distal MCA branches; findings are compatible with intracranial atherosclerosis.   Electronically Signed   By: Elon Alas M.D.   On: 12/18/2014 04:52   Ct Abdomen Pelvis W Contrast  12/18/2014   CLINICAL DATA:  Left lower quadrant pain.  EXAM: CT ABDOMEN AND PELVIS WITH CONTRAST  TECHNIQUE: Multidetector CT imaging of the abdomen and pelvis was performed using the standard protocol following bolus administration of intravenous contrast.  CONTRAST:  144mL OMNIPAQUE IOHEXOL 300 MG/ML  SOLN  COMPARISON:  CT scan dated 11/22/2013  FINDINGS: Lower chest: There is a 4 mm nodule in the right midzone, probably in the right upper lobe seen on image 5 of series 5. Heart size is normal. Focal area of atelectasis at the right lung base posteriorly.  Hepatobiliary: Normal.  Pancreas: Normal.  Spleen: Normal.  Adrenals/Urinary Tract: Stable 12 mm nodule in the right adrenal gland. Left adrenal gland is normal.  There is small amount of fluid in the left perinephric space, new. There are no renal calculi. The ureters are slightly prominent bilaterally but there is no obstruction. The bladder appears normal. Does the patient have any symptoms of urinary tract infection? If the patient have recently passed a stone? No findings of pyelonephritis in the renal parenchyma.  There are 3 tiny cysts in  the right kidney.  Stomach/Bowel: There are numerous diverticula in the sigmoid portion of the colon but there is no discrete diverticulitis. Appendix has been removed. Small bowel is normal.  Vascular/Lymphatic: Slight aortic atherosclerosis.  No adenopathy.  Other: Tiny amount of free fluid in the pelvis.  No free air.  Musculoskeletal: No acute abnormality. Multilevel degenerative disc and joint disease in the lumbar spine.  IMPRESSION: 1. Left perinephric fluid without renal or ureteral or bladder stone. No abnormal appearing parenchyma in the left kidney. I suspect the patient may have recently passed a stone. 2. Sigmoid diverticulosis without diverticulitis. 3. Aortic atherosclerosis. 4. 4 mm nodule in the right upper lobe. If the patient is at high risk for bronchogenic carcinoma, follow-up chest CT at 1 year is recommended. If the patient is at low risk, no follow-up is needed. This recommendation follows the consensus statement: Guidelines for Management of Small Pulmonary Nodules Detected on CT Scans: A Statement from the [place] as published in Radiology 2005; 237:395-400. 5. Focal atelectasis at the right lung base.   Electronically Signed   By: Lorriane Shire M.D.   On: 12/18/2014 20:47   Dg Abd Portable 1v  12/18/2014   CLINICAL DATA:  Abdominal pain  EXAM: PORTABLE ABDOMEN - 1 VIEW  COMPARISON:  CT urogram of 11/22/2013  FINDINGS: A supine film of the abdomen shows no bowel obstruction. No radiographic evidence of constipation is seen. No opaque calculi are noted. There are degenerative changes in the lower lumbar spine noted.  IMPRESSION: No bowel obstruction. Degenerative change is present in the lower lumbar spine.   Electronically Signed   By: Ivar Drape M.D.   On: 12/18/2014 12:59     PERTINENT LAB RESULTS: CBC: No results for input(s): WBC, HGB, HCT, PLT in the last 72 hours. CMET CMP     Component Value Date/Time   NA 138 12/19/2014 0433   K 3.9 12/19/2014 0433   CL  102 12/19/2014 0433   CO2 31 12/19/2014 0433   GLUCOSE 106* 12/19/2014 0433   BUN 16 12/19/2014 0433   CREATININE 0.95 12/19/2014 0433   CALCIUM 8.4* 12/19/2014 0433   PROT 5.7* 12/18/2014 0144   ALBUMIN 3.3* 12/18/2014 0144   AST 23 12/18/2014 0144   ALT 9* 12/18/2014 0144   ALKPHOS 55 12/18/2014 0144   BILITOT 0.4 12/18/2014 0144   GFRNONAA 52* 12/19/2014 0433   GFRAA >60 12/19/2014 0433    GFR Estimated Creatinine Clearance: 38.3 mL/min (by C-G formula based on Cr of 0.95). No results for input(s): LIPASE, AMYLASE in the last 72 hours. No results for input(s): CKTOTAL, CKMB, CKMBINDEX, TROPONINI in the last 72 hours. Invalid input(s): POCBNP No results for input(s): DDIMER in the last 72 hours.  Recent Labs  12/19/14 0433  HGBA1C 6.2*    Recent Labs  12/19/14 0433  CHOL 147  HDL 47  LDLCALC 87  TRIG 64  CHOLHDL 3.1   No results for input(s): TSH, T4TOTAL, T3FREE, THYROIDAB in  the last 72 hours.  Invalid input(s): FREET3 No results for input(s): VITAMINB12, FOLATE, FERRITIN, TIBC, IRON, RETICCTPCT in the last 72 hours. Coags: No results for input(s): INR in the last 72 hours.  Invalid input(s): PT Microbiology: Recent Results (from the past 240 hour(s))  Culture, Urine     Status: None   Collection Time: 12/18/14  2:25 AM  Result Value Ref Range Status   Specimen Description URINE, RANDOM  Final   Special Requests ADDED [id number]  Final   Culture MULTIPLE SPECIES PRESENT, SUGGEST RECOLLECTION  Final   Report Status 12/19/2014 FINAL  Final     BRIEF HOSPITAL COURSE:  Urinary Tract Infection: Patient presented with worsening LUE weakness, confusion, and LLQ abdominal pain, U/A positive for large leukocytes. Treated with 3 days of IV ceftriaxone. Urine culture shows multiple bacterial morphotypes-since already on antibiotics, culture not repeated. Since has completed 3 days of Rocephin, will not be discharged on any further antibiotics.   ?TIA: Patient  has had left arm weakness since prior CVA. Likely acutely worsened in the setting of UTI. Improvement seen with initiation of IV abx - left arm weakness now back to usual baseline. MRI brain shows no acute infarcts. Recent echo shows preserved EF and no embolic foci, and patient taking Eliquis for her A fib. Carotid doppler without significant stenosis. LDL near goal at 87- not on statin d/t intolerance. HgA1c 6.2.   Atypical chest pain: Suspect GI related. EKG without any major changes. Continue PPI on discharge  Essential hypertension: Continue amlodipine, further optimization deferred to the outpatient setting  Hyperlipidemia: LDL sub-optimal at 87, continue management with diet and exercise. Patient's daughters report intolerance to statins.   Atrial Fibrillation: On eliquis. Rate well controlled  Dementia: Tried Aricept - intolerant due to GI side effects. Continue Depakote.  TODAY-DAY OF DISCHARGE:  Subjective:   Nicholle Falzon today has no headache,no chest abdominal pain,no new weakness tingling or numbness, feels much better   Objective:   Blood pressure 147/69, pulse 67, temperature 98.2 F (36.8 C), temperature source Oral, resp. rate 18, height 5\' 6"  (1.676 m), weight 59.784 kg (131 lb 12.8 oz), SpO2 97 %. No intake or output data in the 24 hours ending 12/21/14 0821 Filed Weights   12/18/14 0213 12/18/14 0543  Weight: 58.968 kg (130 lb) 59.784 kg (131 lb 12.8 oz)    Exam Awake Alert, Oriented *3, No new F.N deficits, Normal affect [place].AT,PERRAL Supple Neck,No JVD, No cervical lymphadenopathy appriciated.  Symmetrical Chest wall movement, Good air movement bilaterally, CTAB RRR,No Gallops,Rubs or new Murmurs, No Parasternal Heave +ve B.Sounds, Abd Soft, Non tender, No organomegaly appriciated, No rebound -guarding or rigidity. No Cyanosis, Clubbing or edema, No new Rash or bruise  DISCHARGE CONDITION: Stable  DISPOSITION: SNF  DISCHARGE INSTRUCTIONS:    Activity:    As tolerated with Full fall precautions use walker/cane & assistance as needed  Get Medicines reviewed and adjusted: Please take all your medications with you for your next visit with your Primary MD  Please request your Primary MD to go over all hospital tests and procedure/radiological results at the follow up, please ask your Primary MD to get all Hospital records sent to his/her office.  If you experience worsening of your admission symptoms, develop shortness of breath, life threatening emergency, suicidal or homicidal thoughts you must seek medical attention immediately by calling 911 or calling your MD immediately  if symptoms less severe.  You must read complete instructions/literature along with all the  possible adverse reactions/side effects for all the Medicines you take and that have been prescribed to you. Take any new Medicines after you have completely understood and accpet all the possible adverse reactions/side effects.   Do not drive when taking Pain medications.   Do not take more than prescribed Pain, Sleep and Anxiety Medications  Special Instructions: If you have smoked or chewed Tobacco  in the last 2 yrs please stop smoking, stop any regular Alcohol  and or any Recreational drug use.  Wear Seat belts while driving.  Please note  You were cared for by a hospitalist during your hospital stay. Once you are discharged, your primary care physician will handle any further medical issues. Please note that NO REFILLS for any discharge medications will be authorized once you are discharged, as it is imperative that you return to your primary care physician (or establish a relationship with a primary care physician if you do not have one) for your aftercare needs so that they can reassess your need for medications and monitor your lab values.   Diet recommendation: Heart Healthy diet  Discharge Instructions    Diet - low sodium heart healthy    Complete by:  As directed       Increase activity slowly    Complete by:  As directed            Follow-up Information    Follow up with [name], DO. Schedule an appointment as soon as possible for a visit in 2 weeks.   Specialty:  Family Medicine   Why:  After discharge from SNF   Contact information:   [address] [place] [address] [phone]      Total Time spent on discharge equals  45 minutes.  SignedOren Binet 12/21/2014 8:21 AM

## 2014-12-20 NOTE — Care Management Important Message (Signed)
Important Message  Patient Details  Name: [name] MRN: [id number] Date of Birth: 1924-11-23   Medicare Important Message Given:  Yes-second notification given    Loann Quill 12/20/2014, 11:09 AM

## 2014-12-20 NOTE — Clinical Social Work Note (Signed)
Clinical Social Worker has sent discharge summary in [facility]. Patient to discharge tomorrow, 9/24.  FL-2 signed and on chart.   Glendon Axe, MSW, LCSWA [phone] 12/20/2014 3:30 PM

## 2014-12-21 DIAGNOSIS — I10 Essential (primary) hypertension: Secondary | ICD-10-CM | POA: Diagnosis not present

## 2014-12-21 DIAGNOSIS — R260 Ataxic gait: Secondary | ICD-10-CM | POA: Diagnosis not present

## 2014-12-21 DIAGNOSIS — G459 Transient cerebral ischemic attack, unspecified: Secondary | ICD-10-CM | POA: Diagnosis not present

## 2014-12-21 DIAGNOSIS — Z5189 Encounter for other specified aftercare: Secondary | ICD-10-CM | POA: Diagnosis not present

## 2014-12-21 DIAGNOSIS — E785 Hyperlipidemia, unspecified: Secondary | ICD-10-CM | POA: Diagnosis not present

## 2014-12-21 DIAGNOSIS — M1990 Unspecified osteoarthritis, unspecified site: Secondary | ICD-10-CM | POA: Diagnosis not present

## 2014-12-21 DIAGNOSIS — M6289 Other specified disorders of muscle: Secondary | ICD-10-CM | POA: Diagnosis not present

## 2014-12-21 DIAGNOSIS — R531 Weakness: Secondary | ICD-10-CM | POA: Diagnosis not present

## 2014-12-21 DIAGNOSIS — R1312 Dysphagia, oropharyngeal phase: Secondary | ICD-10-CM | POA: Diagnosis not present

## 2014-12-21 DIAGNOSIS — I4892 Unspecified atrial flutter: Secondary | ICD-10-CM | POA: Diagnosis not present

## 2014-12-21 DIAGNOSIS — R41841 Cognitive communication deficit: Secondary | ICD-10-CM | POA: Diagnosis not present

## 2014-12-21 DIAGNOSIS — N390 Urinary tract infection, site not specified: Secondary | ICD-10-CM | POA: Diagnosis not present

## 2014-12-21 DIAGNOSIS — Z86010 Personal history of colonic polyps: Secondary | ICD-10-CM | POA: Diagnosis not present

## 2014-12-21 DIAGNOSIS — Z87891 Personal history of nicotine dependence: Secondary | ICD-10-CM | POA: Diagnosis not present

## 2014-12-21 DIAGNOSIS — G43909 Migraine, unspecified, not intractable, without status migrainosus: Secondary | ICD-10-CM | POA: Diagnosis not present

## 2014-12-21 DIAGNOSIS — I951 Orthostatic hypotension: Secondary | ICD-10-CM | POA: Diagnosis not present

## 2014-12-21 DIAGNOSIS — R279 Unspecified lack of coordination: Secondary | ICD-10-CM | POA: Diagnosis not present

## 2014-12-21 DIAGNOSIS — M6281 Muscle weakness (generalized): Secondary | ICD-10-CM | POA: Diagnosis not present

## 2014-12-21 DIAGNOSIS — I483 Typical atrial flutter: Secondary | ICD-10-CM | POA: Diagnosis not present

## 2014-12-21 DIAGNOSIS — Z791 Long term (current) use of non-steroidal anti-inflammatories (NSAID): Secondary | ICD-10-CM | POA: Diagnosis not present

## 2014-12-21 DIAGNOSIS — E46 Unspecified protein-calorie malnutrition: Secondary | ICD-10-CM | POA: Diagnosis not present

## 2014-12-21 DIAGNOSIS — R231 Pallor: Secondary | ICD-10-CM | POA: Diagnosis not present

## 2014-12-21 DIAGNOSIS — R079 Chest pain, unspecified: Secondary | ICD-10-CM | POA: Diagnosis not present

## 2014-12-21 DIAGNOSIS — Z79899 Other long term (current) drug therapy: Secondary | ICD-10-CM | POA: Diagnosis not present

## 2014-12-21 DIAGNOSIS — Z8673 Personal history of transient ischemic attack (TIA), and cerebral infarction without residual deficits: Secondary | ICD-10-CM | POA: Diagnosis not present

## 2014-12-21 DIAGNOSIS — K5900 Constipation, unspecified: Secondary | ICD-10-CM | POA: Diagnosis not present

## 2014-12-21 DIAGNOSIS — R0789 Other chest pain: Secondary | ICD-10-CM | POA: Diagnosis not present

## 2014-12-21 DIAGNOSIS — G3184 Mild cognitive impairment, so stated: Secondary | ICD-10-CM | POA: Diagnosis not present

## 2014-12-21 DIAGNOSIS — I482 Chronic atrial fibrillation: Secondary | ICD-10-CM | POA: Diagnosis not present

## 2014-12-21 DIAGNOSIS — Z7901 Long term (current) use of anticoagulants: Secondary | ICD-10-CM | POA: Diagnosis not present

## 2014-12-21 DIAGNOSIS — Z8719 Personal history of other diseases of the digestive system: Secondary | ICD-10-CM | POA: Diagnosis not present

## 2014-12-21 DIAGNOSIS — G4700 Insomnia, unspecified: Secondary | ICD-10-CM | POA: Diagnosis not present

## 2014-12-21 DIAGNOSIS — Z8669 Personal history of other diseases of the nervous system and sense organs: Secondary | ICD-10-CM | POA: Diagnosis not present

## 2014-12-21 DIAGNOSIS — I4891 Unspecified atrial fibrillation: Secondary | ICD-10-CM | POA: Diagnosis not present

## 2014-12-21 DIAGNOSIS — K219 Gastro-esophageal reflux disease without esophagitis: Secondary | ICD-10-CM | POA: Diagnosis not present

## 2014-12-21 DIAGNOSIS — N3281 Overactive bladder: Secondary | ICD-10-CM | POA: Diagnosis not present

## 2014-12-21 NOTE — Clinical Social Work Placement (Signed)
   CLINICAL SOCIAL WORK PLACEMENT  NOTE  Date:  12/21/2014  Patient Details  Name: NATALE BARBA MRN: [id number] Date of Birth: 1924-09-19  Clinical Social Work is seeking post-discharge placement for this patient at the [facility] level of care (*CSW will initial, date and re-position this form in  chart as items are completed):  Yes   Patient/family provided with [facility]'s list of facilities offering this level of care within the geographic area requested by the patient (or if unable, by the patient's family).  Yes   Patient/family informed of their freedom to choose among providers that offer the needed level of care, that participate in Medicare, Medicaid or managed care program needed by the patient, have an available bed and are willing to accept the patient.  Yes   Patient/family informed of [facility]'s ownership interest in [facility] and [facility], as well as of the fact that they are under no obligation to receive care at these facilities.  PASRR submitted to EDS on       PASRR number received on       Existing PASRR number confirmed on 12/19/14     FL2 transmitted to all facilities in geographic area requested by pt/family on 12/19/14     FL2 transmitted to all facilities within larger geographic area on       Patient informed that his/her managed care company has contracts with or will negotiate with certain facilities, including the following:            Patient/family informed of bed offers received.  Patient chooses bed at  [facility]     Physician recommends and patient chooses bed at  [facility]    Patient to be transferred to   [facility] on   12/21/14.  Patient to be transferred to facility by  [facility]     Patient family notified on  12/21/14 of transfer.  Name of family member notified:by RN        PHYSICIAN Please sign FL2     Additional Comment:     _______________________________________________ Christene Lye, LCSW 12/21/2014, 9:47 AM

## 2014-12-21 NOTE — Progress Notes (Signed)
Left message with SW at [phone] r/t patient's disposition today. Awaiting for call back.  Ave Filter, RN

## 2014-12-23 ENCOUNTER — Non-Acute Institutional Stay (SKILLED_NURSING_FACILITY): Payer: Medicare Other | Admitting: Adult Health

## 2014-12-23 ENCOUNTER — Encounter: Payer: Self-pay | Admitting: Adult Health

## 2014-12-23 DIAGNOSIS — K5900 Constipation, unspecified: Secondary | ICD-10-CM | POA: Diagnosis not present

## 2014-12-23 DIAGNOSIS — N3281 Overactive bladder: Secondary | ICD-10-CM | POA: Diagnosis not present

## 2014-12-23 DIAGNOSIS — R531 Weakness: Secondary | ICD-10-CM

## 2014-12-23 DIAGNOSIS — I482 Chronic atrial fibrillation, unspecified: Secondary | ICD-10-CM

## 2014-12-23 DIAGNOSIS — N390 Urinary tract infection, site not specified: Secondary | ICD-10-CM | POA: Diagnosis not present

## 2014-12-23 DIAGNOSIS — G459 Transient cerebral ischemic attack, unspecified: Secondary | ICD-10-CM | POA: Diagnosis not present

## 2014-12-23 DIAGNOSIS — G4700 Insomnia, unspecified: Secondary | ICD-10-CM | POA: Diagnosis not present

## 2014-12-23 DIAGNOSIS — G3184 Mild cognitive impairment, so stated: Secondary | ICD-10-CM

## 2014-12-23 DIAGNOSIS — E785 Hyperlipidemia, unspecified: Secondary | ICD-10-CM

## 2014-12-23 DIAGNOSIS — I10 Essential (primary) hypertension: Secondary | ICD-10-CM

## 2014-12-23 DIAGNOSIS — K219 Gastro-esophageal reflux disease without esophagitis: Secondary | ICD-10-CM | POA: Diagnosis not present

## 2014-12-25 ENCOUNTER — Non-Acute Institutional Stay (SKILLED_NURSING_FACILITY): Payer: Medicare Other | Admitting: Internal Medicine

## 2014-12-25 DIAGNOSIS — Z8673 Personal history of transient ischemic attack (TIA), and cerebral infarction without residual deficits: Secondary | ICD-10-CM

## 2014-12-25 DIAGNOSIS — K5900 Constipation, unspecified: Secondary | ICD-10-CM

## 2014-12-25 DIAGNOSIS — G3184 Mild cognitive impairment, so stated: Secondary | ICD-10-CM

## 2014-12-25 DIAGNOSIS — K219 Gastro-esophageal reflux disease without esophagitis: Secondary | ICD-10-CM | POA: Diagnosis not present

## 2014-12-25 DIAGNOSIS — G4700 Insomnia, unspecified: Secondary | ICD-10-CM

## 2014-12-25 DIAGNOSIS — E46 Unspecified protein-calorie malnutrition: Secondary | ICD-10-CM

## 2014-12-25 DIAGNOSIS — I10 Essential (primary) hypertension: Secondary | ICD-10-CM

## 2014-12-25 DIAGNOSIS — R531 Weakness: Secondary | ICD-10-CM

## 2014-12-25 DIAGNOSIS — I482 Chronic atrial fibrillation, unspecified: Secondary | ICD-10-CM

## 2014-12-25 DIAGNOSIS — N3281 Overactive bladder: Secondary | ICD-10-CM | POA: Diagnosis not present

## 2014-12-25 NOTE — Progress Notes (Signed)
Patient ID: [name], female   DOB: 1924/10/14, 81 y.o.   MRN: [id number]     [facility]   PCP: Gildardo Cranker, DO  Code Status: DNR  Allergies  Allergen Reactions  . Aricept [Donepezil Hcl] Hypertension    With N/V  . Tramadol Hcl Other (See Comments)     lethargy, nausea  . Alendronate Sodium Rash and Other (See Comments)    puffy eyes    Chief Complaint  Patient presents with  . New Admit To SNF    New Admit     HPI:  81 y.o. patient is here for short term rehabilitation post hospital admission from 12/18/14-12/21/14 with left sided weakness. There were concerns for TIA. Stroke workup was negative for acute findings. She was diagnosed to have UTI and was treated with antibiotics. She has PMH of afib, HLD, HTN, dementia among others. She is seen in her room today. She complaints of low energy level. Denies urinary complaints. She has been having trouble with her bowel movement with last one 2 days back.   Review of Systems:  Constitutional: Negative for fever, chills, diaphoresis.  HENT: Negative for headache, congestion, nasal discharge   Eyes: Negative for eye pain, blurred vision, double vision and discharge.  Respiratory: Negative for cough, shortness of breath and wheezing.   Cardiovascular: Negative for chest pain, palpitations, leg swelling.  Gastrointestinal: Negative for heartburn, nausea, vomiting, abdominal pain Genitourinary: Negative for dysuria, flank pain.  Musculoskeletal: Negative for back pain, falls Skin: Negative for itching, rash.  Neurological: Negative for dizziness, tingling, focal weakness Psychiatric/Behavioral: Negative for depression   Past Medical History  Diagnosis Date  . Arthritis   . Hypertension   . Migraine   . Colon polyp   . Hearing difficulty   . Torus palatinus   . Atrial flutter January, 2012  . Stroke 08/02/12     right lenticular nucleus infarct  . Left leg weakness 02/05/2013  .  Chronic anticoagulation   . High cholesterol   . Atrial fibrillation     on Eliquis Rx  . Mild cognitive impairment with memory loss   . Malnutrition   . TIA (transient ischemic attack)   . Constipation   . Osteoporosis   . Hemorrhoids    Past Surgical History  Procedure Laterality Date  . Cataract extraction w/ intraocular lens  implant, bilateral  2006-2008  . Ganglion cyst excision Bilateral 1938,1954,2003,2005    "wrists/hand" (08/01/2012)  . Cardioversion  05/19/2010    Dr. Einar Gip  . Tonsillectomy  ~ 1935  . Appendectomy  02/19/53    `  . Mouth surgery      [name]   Social History:   reports that she has quit smoking. She has never used smokeless tobacco. She reports that she does not drink alcohol or use illicit drugs.  Family History  Problem Relation Age of Onset  . Colon cancer Father   . Breast cancer    . Brain cancer    . Cancer Mother     breast  . Aneurysm Mother     brain  . Heart attack Neg Hx   . Diabetes Neg Hx   . Hypertension Neg Hx   . Stroke Mother     Medications:   Medication List       This list is accurate as of: 12/25/14 10:32 AM.  Always use your most recent med list.  acetaminophen 500 MG tablet  Commonly known as:  TYLENOL  Take 1,000 mg by mouth every 8 (eight) hours as needed for mild pain or headache.     amLODipine 5 MG tablet  Commonly known as:  NORVASC  Take 1 tablet (5 mg total) by mouth daily.     apixaban 2.5 MG Tabs tablet  Commonly known as:  ELIQUIS  TAKE 1 TABLET (2.5 MG TOTAL) BY MOUTH 2 (TWO) TIMES DAILY.     b complex vitamins tablet  Take 1 tablet by mouth 2 (two) times daily.     CALCIUM 600 + D 600-200 MG-UNIT Tabs  Generic drug:  Calcium Carb-Cholecalciferol  Take 1 tablet by mouth 2 (two) times daily.     COLACE PO  Take by mouth daily.     diphenhydramine-acetaminophen 25-500 MG Tabs tablet  Commonly known as:  TYLENOL PM  Take 1 tablet by mouth at bedtime as needed (sleep).      divalproex 125 MG DR tablet  Commonly known as:  DEPAKOTE  Take 2 tablets (250 mg total) by mouth 2 (two) times daily.     Melatonin 3 MG Tabs  Take 1 tablet by mouth at bedtime     multivitamin with minerals Tabs tablet  Take 1 tablet by mouth daily.     ondansetron 4 MG tablet  Commonly known as:  ZOFRAN  Take 1 tablet (4 mg total) by mouth every 6 (six) hours as needed for nausea.     oxybutynin 5 MG tablet  Commonly known as:  DITROPAN  Take 1 tablet (5 mg total) by mouth 2 (two) times daily.     pantoprazole 40 MG tablet  Commonly known as:  PROTONIX  Take 1 tablet (40 mg total) by mouth daily at 12 noon.     PROCEL PO  One Scoop twice daily for protein     Vitamin D 2000 UNITS tablet  Take 2,000 Units by mouth daily.         Physical Exam: Filed Vitals:   12/25/14 1027  BP: 120/74  Pulse: 78  Temp: 96.1 F (35.6 C)  TempSrc: Oral  Resp: 20  Height: 5\' 6"  (1.676 m)  Weight: 131 lb 3.2 oz (59.512 kg)  SpO2: 96%    General- elderly female, in no acute distress Head- normocephalic, atraumatic Nose- normal nasal mucosa, no maxillary or frontal sinus tenderness, no nasal discharge Throat- moist mucus membrane Eyes- PERRLA, EOMI, no pallor, no icterus, no discharge, normal conjunctiva, normal sclera Neck- no cervical lymphadenopathy Cardiovascular- normal s1,s2, no murmurs, no eg edema Respiratory- bilateral clear to auscultation, no wheeze, no rhonchi, no crackles, no use of accessory muscles Abdomen- bowel sounds present, soft, non tender Musculoskeletal- able to move all 4 extremities, left sided weakness > right side, using walker and wheelchair Neurological- no focal deficit, alert and oriented to person and place Skin- warm and dry Psychiatry- normal mood and affect    Labs reviewed: Basic Metabolic Panel:  Recent Labs  10/31/14 0407 12/18/14 0144 12/18/14 0151 12/19/14 0433  NA 140 140 141 138  K 4.2 4.5 4.4 3.9  CL 107 104 103 102  CO2  27 27  --  31  GLUCOSE 149* 99 98 106*  BUN 14 20 22* 16  CREATININE 0.85 1.03* 1.00 0.95  CALCIUM 8.6* 9.2  --  8.4*   Liver Function Tests:  Recent Labs  06/24/14 1132 10/30/14 2300 12/18/14 0144  AST 21 25 23   ALT 12 12*  9*  ALKPHOS 41 51 55  BILITOT 0.5 0.4 0.4  PROT 6.7 6.4* 5.7*  ALBUMIN 3.8 3.6 3.3*    Recent Labs  10/30/14 2300  LIPASE 44   No results for input(s): AMMONIA in the last 8760 hours. CBC:  Recent Labs  06/24/14 1132 10/30/14 2300 10/31/14 0407 12/18/14 0144 12/18/14 0151  WBC 5.2 8.5 5.2 4.9  --   NEUTROABS 3.2 6.8  --  2.2  --   HGB 14.3 13.0 12.7 12.9 13.6  HCT 42.7 40.0 38.3 40.1 40.0  MCV 93.9 93.7 92.7 94.1  --   PLT 174.0 164 169 173  --    Cardiac Enzymes:  Recent Labs  10/31/14 0407 10/31/14 0908 10/31/14 1510  TROPONINI <0.03 <0.03 <0.03   BNP: Invalid input(s): POCBNP CBG: No results for input(s): GLUCAP in the last 8760 hours.  Radiological Exams: Ct Head Wo Contrast  12/18/2014   CLINICAL DATA:  Code stroke, left-sided weakness, history of strokes, hypertension, atrial fibrillation, former smoker  EXAM: CT HEAD WITHOUT CONTRAST  TECHNIQUE: Contiguous axial images were obtained from the base of the skull through the vertex without intravenous contrast.  COMPARISON:  08/24/2013  FINDINGS: Generalized atrophy.  Normal ventricular morphology.  No midline shift or mass effect.  Extensive small vessel chronic ischemic changes of deep cerebral white matter.  Old lacunar infarct RIGHT basal ganglia.  Old cortical infarcts RIGHT frontal and LEFT posterior parietal.  No intracranial hemorrhage, mass lesion or evidence acute infarction.  No extra-axial fluid collections.  Atherosclerotic calcifications at the carotid siphons.  Bones and sinuses grossly unremarkable.  IMPRESSION: Old infarcts RIGHT basal ganglia, RIGHT frontal and LEFT posterior parietal.  Atrophy with small vessel chronic ischemic changes of deep cerebral white matter.   No acute intracranial abnormalities.  Critical Value/emergent results were called by telephone at the time of interpretation on 12/18/2014 at 0206 hr to Dr. [name], who verbally acknowledged these results.   Electronically Signed   By: Lavonia Dana M.D.   On: 12/18/2014 02:07   Mr [name] Head Wo Contrast  12/18/2014   CLINICAL DATA:  Increasing LEFT-sided weakness beginning yesterday, near fall. History of LEFT-sided hemi paresis, hypertension, atrial fibrillation, migraine, mild cognitive impairment.  EXAM: MRI HEAD WITHOUT CONTRAST  MRA HEAD WITHOUT CONTRAST  TECHNIQUE: Multiplanar, multiecho pulse sequences of the brain and surrounding structures were obtained without intravenous contrast. Angiographic images of the head were obtained using MRA technique without contrast.  COMPARISON:  CT head December 18, 2014 Set 1:51 a.m. and MRI of the head Aug 24, 2013  FINDINGS: MRI HEAD FINDINGS  No reduced diffusion to suggest acute ischemia. No susceptibility artifact to suggest hemorrhage. LEFT inferior parietal lobe encephalomalacia. RIGHT mesial frontal lobe encephalomalacia. Old cystic RIGHT basal ganglia lacunar infarct with mild ex vacuo dilatation of subjacent ventricle. Moderate to severe ventriculomegaly on the basis of global parenchymal brain volume loss. Old small RIGHT cerebellar infarcts. Confluent supratentorial white matter T2 hyperintense signal compatible with chronic small vessel ischemic disease without midline shift, mass effect or mass lesions.  No abnormal extra-axial fluid collections. Status post bilateral ocular lens implants. The paranasal sinuses and mastoid air cells are well aerated. No abnormal sellar expansion. No cerebellar tonsillar ectopia. No suspicious calvarial bone marrow signal. Generalized bright T1 bone marrow signal compatible with osteopenia.  MRA HEAD FINDINGS  Anterior circulation: Normal flow related enhancement of the included cervical, petrous, cavernous and supra clinoid  internal carotid arteries. Patent anterior communicating artery. Normal flow related  enhancement of the anterior and middle cerebral arteries, including more distal segments. Moderate luminal regularity of the mid to distal MCA branches, similar to prior imaging, high-grade stenosis bilateral distal A2 segments, similar to progressed.  No large vessel occlusion, aneurysm.  Posterior circulation: Codominant vertebral arteries. Basilar artery is patent, with normal flow related enhancement of the main branch vessels. Normal flow related enhancement of the posterior cerebral arteries. Moderate stenosis LEFT distal P2 segment, unchanged.  No large vessel occlusion, abnormal luminal irregularity, aneurysm.  IMPRESSION: MRI HEAD: No acute intracranial process.  Chronic changes including: Old RIGHT ACA territory infarct. Old LEFT MCA versus watershed parietal infarct. Old RIGHT basal ganglia lacunar infarct.  MRA HEAD: No acute large vessel occlusion.  High-grade stenosis bilateral distal A2 segments, moderate stenosis LEFT P2 segment. Moderate luminal regularity of the mid to distal MCA branches; findings are compatible with intracranial atherosclerosis.   Electronically Signed   By: Elon Alas M.D.   On: 12/18/2014 04:52   Mr Brain Wo Contrast  12/18/2014   CLINICAL DATA:  Increasing LEFT-sided weakness beginning yesterday, near fall. History of LEFT-sided hemi paresis, hypertension, atrial fibrillation, migraine, mild cognitive impairment.  EXAM: MRI HEAD WITHOUT CONTRAST  MRA HEAD WITHOUT CONTRAST  TECHNIQUE: Multiplanar, multiecho pulse sequences of the brain and surrounding structures were obtained without intravenous contrast. Angiographic images of the head were obtained using MRA technique without contrast.  COMPARISON:  CT head December 18, 2014 Set 1:51 a.m. and MRI of the head Aug 24, 2013  FINDINGS: MRI HEAD FINDINGS  No reduced diffusion to suggest acute ischemia. No susceptibility artifact to  suggest hemorrhage. LEFT inferior parietal lobe encephalomalacia. RIGHT mesial frontal lobe encephalomalacia. Old cystic RIGHT basal ganglia lacunar infarct with mild ex vacuo dilatation of subjacent ventricle. Moderate to severe ventriculomegaly on the basis of global parenchymal brain volume loss. Old small RIGHT cerebellar infarcts. Confluent supratentorial white matter T2 hyperintense signal compatible with chronic small vessel ischemic disease without midline shift, mass effect or mass lesions.  No abnormal extra-axial fluid collections. Status post bilateral ocular lens implants. The paranasal sinuses and mastoid air cells are well aerated. No abnormal sellar expansion. No cerebellar tonsillar ectopia. No suspicious calvarial bone marrow signal. Generalized bright T1 bone marrow signal compatible with osteopenia.  MRA HEAD FINDINGS  Anterior circulation: Normal flow related enhancement of the included cervical, petrous, cavernous and supra clinoid internal carotid arteries. Patent anterior communicating artery. Normal flow related enhancement of the anterior and middle cerebral arteries, including more distal segments. Moderate luminal regularity of the mid to distal MCA branches, similar to prior imaging, high-grade stenosis bilateral distal A2 segments, similar to progressed.  No large vessel occlusion, aneurysm.  Posterior circulation: Codominant vertebral arteries. Basilar artery is patent, with normal flow related enhancement of the main branch vessels. Normal flow related enhancement of the posterior cerebral arteries. Moderate stenosis LEFT distal P2 segment, unchanged.  No large vessel occlusion, abnormal luminal irregularity, aneurysm.  IMPRESSION: MRI HEAD: No acute intracranial process.  Chronic changes including: Old RIGHT ACA territory infarct. Old LEFT MCA versus watershed parietal infarct. Old RIGHT basal ganglia lacunar infarct.  MRA HEAD: No acute large vessel occlusion.  High-grade stenosis  bilateral distal A2 segments, moderate stenosis LEFT P2 segment. Moderate luminal regularity of the mid to distal MCA branches; findings are compatible with intracranial atherosclerosis.   Electronically Signed   By: Elon Alas M.D.   On: 12/18/2014 04:52   Ct Abdomen Pelvis W Contrast  12/18/2014   CLINICAL  DATA:  Left lower quadrant pain.  EXAM: CT ABDOMEN AND PELVIS WITH CONTRAST  TECHNIQUE: Multidetector CT imaging of the abdomen and pelvis was performed using the standard protocol following bolus administration of intravenous contrast.  CONTRAST:  158mL OMNIPAQUE IOHEXOL 300 MG/ML  SOLN  COMPARISON:  CT scan dated 11/22/2013  FINDINGS: Lower chest: There is a 4 mm nodule in the right midzone, probably in the right upper lobe seen on image 5 of series 5. Heart size is normal. Focal area of atelectasis at the right lung base posteriorly.  Hepatobiliary: Normal.  Pancreas: Normal.  Spleen: Normal.  Adrenals/Urinary Tract: Stable 12 mm nodule in the right adrenal gland. Left adrenal gland is normal.  There is small amount of fluid in the left perinephric space, new. There are no renal calculi. The ureters are slightly prominent bilaterally but there is no obstruction. The bladder appears normal. Does the patient have any symptoms of urinary tract infection? If the patient have recently passed a stone? No findings of pyelonephritis in the renal parenchyma.  There are 3 tiny cysts in the right kidney.  Stomach/Bowel: There are numerous diverticula in the sigmoid portion of the colon but there is no discrete diverticulitis. Appendix has been removed. Small bowel is normal.  Vascular/Lymphatic: Slight aortic atherosclerosis.  No adenopathy.  Other: Tiny amount of free fluid in the pelvis.  No free air.  Musculoskeletal: No acute abnormality. Multilevel degenerative disc and joint disease in the lumbar spine.  IMPRESSION: 1. Left perinephric fluid without renal or ureteral or bladder stone. No abnormal  appearing parenchyma in the left kidney. I suspect the patient may have recently passed a stone. 2. Sigmoid diverticulosis without diverticulitis. 3. Aortic atherosclerosis. 4. 4 mm nodule in the right upper lobe. If the patient is at high risk for bronchogenic carcinoma, follow-up chest CT at 1 year is recommended. If the patient is at low risk, no follow-up is needed. This recommendation follows the consensus statement: Guidelines for Management of Small Pulmonary Nodules Detected on CT Scans: A Statement from the [employer] [place] as published in Radiology 2005; 237:395-400. 5. Focal atelectasis at the right lung base.   Electronically Signed   By: Lorriane Shire M.D.   On: 12/18/2014 20:47   Dg Abd Portable 1v  12/18/2014   CLINICAL DATA:  Abdominal pain  EXAM: PORTABLE ABDOMEN - 1 VIEW  COMPARISON:  CT urogram of 11/22/2013  FINDINGS: A supine film of the abdomen shows no bowel obstruction. No radiographic evidence of constipation is seen. No opaque calculi are noted. There are degenerative changes in the lower lumbar spine noted.  IMPRESSION: No bowel obstruction. Degenerative change is present in the lower lumbar spine.   Electronically Signed   By: Ivar Drape M.D.   On: 12/18/2014 12:59    Assessment/Plan  Generalized weakness Left side more than right, Will have her work with physical therapy and occupational therapy team to help with gait training and muscle strengthening exercises.fall precautions. Skin care. Encourage to be out of bed.   afib Rate controlled, continue eliquis for anticoagulation  History of CVA with left sided weakness Continue eliquis, bp reading stable. LDL at goal.  HTN Stable bp, monitor bp, continue norvasc 5 mg daily   Constipation Change her Colace to 100 mg bid and monitor  Protein calorie malnutrition Dietary consult with protein calorie malnutrition, continue procel for now  Insomnia Continue her melatonin  OAB Continue ditropan 5 mg bid for  now  gerd Stable, continue protonix  40 mg daily and monitor  Mild cognitive impairment with memory loss Failed aricept due to side effect, currently on depakote, continue home regimen, no changes made. No behavioral disturbance in facility. To provide assistance with ADLs as needed. Fall precautions. Pressure ulcer prophylaxis.   Goals of care: short term rehabilitation   Labs/tests ordered: cbc, cmp 1 week  Family/ staff Communication: reviewed care plan with patient and nursing supervisor    Blanchie Serve, MD  [place] [phone] (Monday-Friday 8 am - 5 pm) [phone] (afterhours)

## 2014-12-27 DIAGNOSIS — I483 Typical atrial flutter: Secondary | ICD-10-CM | POA: Diagnosis not present

## 2014-12-27 DIAGNOSIS — I951 Orthostatic hypotension: Secondary | ICD-10-CM | POA: Diagnosis not present

## 2014-12-27 DIAGNOSIS — Z8673 Personal history of transient ischemic attack (TIA), and cerebral infarction without residual deficits: Secondary | ICD-10-CM | POA: Diagnosis not present

## 2014-12-27 NOTE — Progress Notes (Signed)
Patient ID: [name], female   DOB: 05-16-1924, 81 y.o.   MRN: [id number]    DATE:  12/23/14 MRN:  [id number]  BIRTHDAY: 12-30-1924  Facility:  Nursing Home Location:  [place] Room Number: 606-P  LEVEL OF CARE:  SNF [phone])  Contact Information    Name [place] Daughter [phone]  [phone]   [place]um[place],Kim Daughter [phone]   [place] Daughter [phone]  [phone]       Chief Complaint  Patient presents with  . Hospitalization Follow-up    Generalized weakness, UTI, TIA, hypertension, hyperlipidemia, atrial fibrillation, mild cognitive impairment, GERD, constipation, insomnia and overactive bladder    HISTORY OF PRESENT ILLNESS:  This is an 81 year old female who was been admitted to [facility] on 12/21/14 from [facility]. She has PMH of arthritis, hypertension, healing difficulty, stroke, atrial fibrillation on Eliquis, TIA and osteoporosis. She presented to the ED with worsening left upper extremity weakness, confusion and LLQ abdominal pain. UA was positive for large leukocytes. Treated with 3 days ceftriaxone. She had left arm weakness since prior CVA and likely worsened in the setting of UTI. Improved since IV antibiotics started. Left arm weakness is now back to baseline.  She has been admitted for a short-term rehabilitation.  PAST MEDICAL HISTORY:  Past Medical History  Diagnosis Date  . Arthritis   . Hypertension   . Migraine   . Colon polyp   . Hearing difficulty   . Torus palatinus   . Atrial flutter January, 2012  . Stroke 08/02/12     right lenticular nucleus infarct  . Left leg weakness 02/05/2013  . Chronic anticoagulation   . High cholesterol   . Atrial fibrillation     on Eliquis Rx  . Mild cognitive impairment with memory loss   . Malnutrition   . TIA (transient ischemic attack)   . Constipation   . Osteoporosis   . Hemorrhoids       CURRENT MEDICATIONS: Reviewed  Patient's Medications  New Prescriptions   No medications on file  Previous Medications   ACETAMINOPHEN (TYLENOL) 500 MG TABLET    Take 1,000 mg by mouth every 8 (eight) hours as needed for mild pain or headache.    AMLODIPINE (NORVASC) 5 MG TABLET    Take 1 tablet (5 mg total) by mouth daily.   APIXABAN (ELIQUIS) 2.5 MG TABS TABLET    TAKE 1 TABLET (2.5 MG TOTAL) BY MOUTH 2 (TWO) TIMES DAILY.   B COMPLEX VITAMINS TABLET    Take 1 tablet by mouth 2 (two) times daily.    CALCIUM CARB-CHOLECALCIFEROL (CALCIUM 600 + D) 600-200 MG-UNIT TABS    Take 1 tablet by mouth 2 (two) times daily.   CHOLECALCIFEROL (VITAMIN D) 2000 UNITS TABLET    Take 2,000 Units by mouth daily.   DIPHENHYDRAMINE-ACETAMINOPHEN (TYLENOL PM) 25-500 MG TABS    Take 1 tablet by mouth at bedtime as needed (sleep).    DIVALPROEX (DEPAKOTE) 125 MG DR TABLET    Take 2 tablets (250 mg total) by mouth 2 (two) times daily.   DOCUSATE SODIUM (COLACE PO)    Take by mouth daily.    MELATONIN 3 MG TABS    Take 1 tablet by mouth at bedtime   MULTIPLE VITAMIN (MULTIVITAMIN WITH MINERALS) TABS    Take 1 tablet by mouth daily.   ONDANSETRON (ZOFRAN) 4 MG TABLET    Take 1 tablet (4  mg total) by mouth every 6 (six) hours as needed for nausea.   OXYBUTYNIN (DITROPAN) 5 MG TABLET    Take 1 tablet (5 mg total) by mouth 2 (two) times daily.   PANTOPRAZOLE (PROTONIX) 40 MG TABLET    Take 1 tablet (40 mg total) by mouth daily at 12 noon.   PROTEIN (PROCEL PO)    One Scoop twice daily for protein  Modified Medications   No medications on file  Discontinued Medications   No medications on file     Allergies  Allergen Reactions  . Aricept [Donepezil Hcl] Hypertension    With N/V  . Tramadol Hcl Other (See Comments)     lethargy, nausea  . Alendronate Sodium Rash and Other (See Comments)    puffy eyes     REVIEW OF SYSTEMS:  GENERAL: no change in appetite, no fatigue, no weight changes, no fever,  chills or weakness EYES: Denies change in vision, dry eyes, eye pain, itching or discharge EARS: Denies change in hearing, ringing in ears, or earache NOSE: Denies nasal congestion or epistaxis MOUTH and THROAT: Denies oral discomfort, gingival pain or bleeding, pain from teeth or hoarseness   RESPIRATORY: no cough, SOB, DOE, wheezing, hemoptysis CARDIAC: no chest pain, edema or palpitations GI: no abdominal pain, diarrhea, constipation, heart burn, nausea or vomiting GU: Denies dysuria, frequency, hematuria, incontinence, or discharge PSYCHIATRIC: Denies feeling of depression or anxiety. No report of hallucinations, insomnia, paranoia, or agitation   PHYSICAL EXAMINATION  GENERAL APPEARANCE: Well nourished. In no acute distress. Normal body habitus HEAD: Normal in size and contour. No evidence of trauma EYES: Lids open and close normally. No blepharitis, entropion or ectropion. PERRL. Conjunctivae are clear and sclerae are white. Lenses are without opacity EARS: Pinnae are normal. Patient hears normal voice tunes of the examiner MOUTH and THROAT: Lips are without lesions. Oral mucosa is moist and without lesions. Tongue is normal in shape, size, and color and without lesions NECK: supple, trachea midline, no neck masses, no thyroid tenderness, no thyromegaly LYMPHATICS: no LAN in the neck, no supraclavicular LAN RESPIRATORY: breathing is even & unlabored, BS CTAB CARDIAC: RRR, no murmur,no extra heart sounds, no edema GI: abdomen soft, normal BS, no masses, no tenderness, no hepatomegaly, no splenomegaly EXTREMITIES:  Left upper and lower extremities are weak PSYCHIATRIC: Alert and oriented X 3. Affect and behavior are appropriate  LABS/RADIOLOGY: Labs reviewed: Basic Metabolic Panel:  Recent Labs  10/31/14 0407 12/18/14 0144 12/18/14 0151 12/19/14 0433  NA 140 140 141 138  K 4.2 4.5 4.4 3.9  CL 107 104 103 102  CO2 27 27  --  31  GLUCOSE 149* 99 98 106*  BUN 14 20 22* 16    CREATININE 0.85 1.03* 1.00 0.95  CALCIUM 8.6* 9.2  --  8.4*   Liver Function Tests:  Recent Labs  06/24/14 1132 10/30/14 2300 12/18/14 0144  AST _0 ALT 12 12* 9*  ALKPHOS 41 51 55  BILITOT 0.5 0.4 0.4  PROT 6.7 6.4* 5.7*  ALBUMIN 3.8 3.6 3.3*    Recent Labs  10/30/14 2300  LIPASE 44   CBC:  Recent Labs  06/24/14 1132 10/30/14 2300 10/31/14 0407 12/18/14 0144 12/18/14 0151  WBC 5.2 8.5 5.2 4.9  --   NEUTROABS 3.2 6.8  --  2.2  --   HGB 14.3 13.0 12.7 12.9 13.6  HCT 42.7 40.0 38.3 40.1 40.0  MCV 93.9 93.7 92.7 94.1  --   PLT 174.0  164 169 173  --    Lipid Panel:  Recent Labs  06/24/14 1132 12/19/14 0433  HDL 81.70 47   Cardiac Enzymes:  Recent Labs  10/31/14 0407 10/31/14 0908 10/31/14 1510  TROPONINI <0.03 <0.03 <0.03   CBG: No results for input(s): GLUCAP in the last 8760 hours.   Ct Head Wo Contrast  12/18/2014   CLINICAL DATA:  Code stroke, left-sided weakness, history of strokes, hypertension, atrial fibrillation, former smoker  EXAM: CT HEAD WITHOUT CONTRAST  TECHNIQUE: Contiguous axial images were obtained from the base of the skull through the vertex without intravenous contrast.  COMPARISON:  08/24/2013  FINDINGS: Generalized atrophy.  Normal ventricular morphology.  No midline shift or mass effect.  Extensive small vessel chronic ischemic changes of deep cerebral white matter.  Old lacunar infarct RIGHT basal ganglia.  Old cortical infarcts RIGHT frontal and LEFT posterior parietal.  No intracranial hemorrhage, mass lesion or evidence acute infarction.  No extra-axial fluid collections.  Atherosclerotic calcifications at the carotid siphons.  Bones and sinuses grossly unremarkable.  IMPRESSION: Old infarcts RIGHT basal ganglia, RIGHT frontal and LEFT posterior parietal.  Atrophy with small vessel chronic ischemic changes of deep cerebral white matter.  No acute intracranial abnormalities.  Critical Value/emergent results were called by  telephone at the time of interpretation on 12/18/2014 at 0206 hr to Dr. [name], who verbally acknowledged these results.   Electronically Signed   By: Lavonia Dana M.D.   On: 12/18/2014 02:07   Mr [name] Head Wo Contrast  12/18/2014   CLINICAL DATA:  Increasing LEFT-sided weakness beginning yesterday, near fall. History of LEFT-sided hemi paresis, hypertension, atrial fibrillation, migraine, mild cognitive impairment.  EXAM: MRI HEAD WITHOUT CONTRAST  MRA HEAD WITHOUT CONTRAST  TECHNIQUE: Multiplanar, multiecho pulse sequences of the brain and surrounding structures were obtained without intravenous contrast. Angiographic images of the head were obtained using MRA technique without contrast.  COMPARISON:  CT head December 18, 2014 Set 1:51 a.m. and MRI of the head Aug 24, 2013  FINDINGS: MRI HEAD FINDINGS  No reduced diffusion to suggest acute ischemia. No susceptibility artifact to suggest hemorrhage. LEFT inferior parietal lobe encephalomalacia. RIGHT mesial frontal lobe encephalomalacia. Old cystic RIGHT basal ganglia lacunar infarct with mild ex vacuo dilatation of subjacent ventricle. Moderate to severe ventriculomegaly on the basis of global parenchymal brain volume loss. Old small RIGHT cerebellar infarcts. Confluent supratentorial white matter T2 hyperintense signal compatible with chronic small vessel ischemic disease without midline shift, mass effect or mass lesions.  No abnormal extra-axial fluid collections. Status post bilateral ocular lens implants. The paranasal sinuses and mastoid air cells are well aerated. No abnormal sellar expansion. No cerebellar tonsillar ectopia. No suspicious calvarial bone marrow signal. Generalized bright T1 bone marrow signal compatible with osteopenia.  MRA HEAD FINDINGS  Anterior circulation: Normal flow related enhancement of the included cervical, petrous, cavernous and supra clinoid internal carotid arteries. Patent anterior communicating artery. Normal flow related  enhancement of the anterior and middle cerebral arteries, including more distal segments. Moderate luminal regularity of the mid to distal MCA branches, similar to prior imaging, high-grade stenosis bilateral distal A2 segments, similar to progressed.  No large vessel occlusion, aneurysm.  Posterior circulation: Codominant vertebral arteries. Basilar artery is patent, with normal flow related enhancement of the main branch vessels. Normal flow related enhancement of the posterior cerebral arteries. Moderate stenosis LEFT distal P2 segment, unchanged.  No large vessel occlusion, abnormal luminal irregularity, aneurysm.  IMPRESSION: MRI HEAD: No acute intracranial process.  Chronic changes including: Old RIGHT ACA territory infarct. Old LEFT MCA versus watershed parietal infarct. Old RIGHT basal ganglia lacunar infarct.  MRA HEAD: No acute large vessel occlusion.  High-grade stenosis bilateral distal A2 segments, moderate stenosis LEFT P2 segment. Moderate luminal regularity of the mid to distal MCA branches; findings are compatible with intracranial atherosclerosis.   Electronically Signed   By: Elon Alas M.D.   On: 12/18/2014 04:52   Mr Brain Wo Contrast  12/18/2014   CLINICAL DATA:  Increasing LEFT-sided weakness beginning yesterday, near fall. History of LEFT-sided hemi paresis, hypertension, atrial fibrillation, migraine, mild cognitive impairment.  EXAM: MRI HEAD WITHOUT CONTRAST  MRA HEAD WITHOUT CONTRAST  TECHNIQUE: Multiplanar, multiecho pulse sequences of the brain and surrounding structures were obtained without intravenous contrast. Angiographic images of the head were obtained using MRA technique without contrast.  COMPARISON:  CT head December 18, 2014 Set 1:51 a.m. and MRI of the head Aug 24, 2013  FINDINGS: MRI HEAD FINDINGS  No reduced diffusion to suggest acute ischemia. No susceptibility artifact to suggest hemorrhage. LEFT inferior parietal lobe encephalomalacia. RIGHT mesial frontal  lobe encephalomalacia. Old cystic RIGHT basal ganglia lacunar infarct with mild ex vacuo dilatation of subjacent ventricle. Moderate to severe ventriculomegaly on the basis of global parenchymal brain volume loss. Old small RIGHT cerebellar infarcts. Confluent supratentorial white matter T2 hyperintense signal compatible with chronic small vessel ischemic disease without midline shift, mass effect or mass lesions.  No abnormal extra-axial fluid collections. Status post bilateral ocular lens implants. The paranasal sinuses and mastoid air cells are well aerated. No abnormal sellar expansion. No cerebellar tonsillar ectopia. No suspicious calvarial bone marrow signal. Generalized bright T1 bone marrow signal compatible with osteopenia.  MRA HEAD FINDINGS  Anterior circulation: Normal flow related enhancement of the included cervical, petrous, cavernous and supra clinoid internal carotid arteries. Patent anterior communicating artery. Normal flow related enhancement of the anterior and middle cerebral arteries, including more distal segments. Moderate luminal regularity of the mid to distal MCA branches, similar to prior imaging, high-grade stenosis bilateral distal A2 segments, similar to progressed.  No large vessel occlusion, aneurysm.  Posterior circulation: Codominant vertebral arteries. Basilar artery is patent, with normal flow related enhancement of the main branch vessels. Normal flow related enhancement of the posterior cerebral arteries. Moderate stenosis LEFT distal P2 segment, unchanged.  No large vessel occlusion, abnormal luminal irregularity, aneurysm.  IMPRESSION: MRI HEAD: No acute intracranial process.  Chronic changes including: Old RIGHT ACA territory infarct. Old LEFT MCA versus watershed parietal infarct. Old RIGHT basal ganglia lacunar infarct.  MRA HEAD: No acute large vessel occlusion.  High-grade stenosis bilateral distal A2 segments, moderate stenosis LEFT P2 segment. Moderate luminal  regularity of the mid to distal MCA branches; findings are compatible with intracranial atherosclerosis.   Electronically Signed   By: Elon Alas M.D.   On: 12/18/2014 04:52   Ct Abdomen Pelvis W Contrast  12/18/2014   CLINICAL DATA:  Left lower quadrant pain.  EXAM: CT ABDOMEN AND PELVIS WITH CONTRAST  TECHNIQUE: Multidetector CT imaging of the abdomen and pelvis was performed using the standard protocol following bolus administration of intravenous contrast.  CONTRAST:  180m OMNIPAQUE IOHEXOL 300 MG/ML  SOLN  COMPARISON:  CT scan dated 11/22/2013  FINDINGS: Lower chest: There is a 4 mm nodule in the right midzone, probably in the right upper lobe seen on image 5 of series 5. Heart size is normal. Focal area of atelectasis at the right lung base posteriorly.  Hepatobiliary: Normal.  Pancreas: Normal.  Spleen: Normal.  Adrenals/Urinary Tract: Stable 12 mm nodule in the right adrenal gland. Left adrenal gland is normal.  There is small amount of fluid in the left perinephric space, new. There are no renal calculi. The ureters are slightly prominent bilaterally but there is no obstruction. The bladder appears normal. Does the patient have any symptoms of urinary tract infection? If the patient have recently passed a stone? No findings of pyelonephritis in the renal parenchyma.  There are 3 tiny cysts in the right kidney.  Stomach/Bowel: There are numerous diverticula in the sigmoid portion of the colon but there is no discrete diverticulitis. Appendix has been removed. Small bowel is normal.  Vascular/Lymphatic: Slight aortic atherosclerosis.  No adenopathy.  Other: Tiny amount of free fluid in the pelvis.  No free air.  Musculoskeletal: No acute abnormality. Multilevel degenerative disc and joint disease in the lumbar spine.  IMPRESSION: 1. Left perinephric fluid without renal or ureteral or bladder stone. No abnormal appearing parenchyma in the left kidney. I suspect the patient may have recently passed a  stone. 2. Sigmoid diverticulosis without diverticulitis. 3. Aortic atherosclerosis. 4. 4 mm nodule in the right upper lobe. If the patient is at high risk for bronchogenic carcinoma, follow-up chest CT at 1 year is recommended. If the patient is at low risk, no follow-up is needed. This recommendation follows the consensus statement: Guidelines for Management of Small Pulmonary Nodules Detected on CT Scans: A Statement from the [employer] as published in Radiology 2005; 237:395-400. 5. Focal atelectasis at the right lung base.   Electronically Signed   By: Lorriane Shire M.D.   On: 12/18/2014 20:47   Dg Abd Portable 1v  12/18/2014   CLINICAL DATA:  Abdominal pain  EXAM: PORTABLE ABDOMEN - 1 VIEW  COMPARISON:  CT urogram of 11/22/2013  FINDINGS: A supine film of the abdomen shows no bowel obstruction. No radiographic evidence of constipation is seen. No opaque calculi are noted. There are degenerative changes in the lower lumbar spine noted.  IMPRESSION: No bowel obstruction. Degenerative change is present in the lower lumbar spine.   Electronically Signed   By: Ivar Drape M.D.   On: 12/18/2014 12:59    ASSESSMENT/PLAN:   Generalized weakness - for rehabilitation  UTI -  S/P ceftriaxone IV antibiotics 3 days  TIA - MRI of brain shows no acute infarcts; recent echo shows preserved EF and no embolic foci; continue Eliquis  Hypertension - continue amlodipine 5 mg 1 tab by mouth daily; check BMP in 1 week  Hyperlipidemia - intolerant to statin; diet and exercise  Atrial fibrillation - rate controlled; continue Eliquis 2.5 mg 1 tab by mouth twice a day; check CBC in 1 week  Mild cognitive impairment with memory loss - intolerant to Aricept  GERD - continue Protonix 40 mg 1 tab by mouth every 12 noon  Constipation - continue Colace 100 mg by mouth twice a day  Insomnia - continue melatonin 3 mg 1 tab by mouth daily at bedtime  Overactive bladder - continue oxybutynin 5 mg by mouth  twice a day      Goals of care:  Short-term rehabilitation    [facility], [facility] [phone]

## 2015-01-01 ENCOUNTER — Emergency Department (HOSPITAL_COMMUNITY): Payer: Medicare Other

## 2015-01-01 ENCOUNTER — Emergency Department (HOSPITAL_COMMUNITY)
Admission: EM | Admit: 2015-01-01 | Discharge: 2015-01-02 | Disposition: A | Discharge status: Home / Self Care | Payer: Medicare Other | Attending: Emergency Medicine | Admitting: Emergency Medicine

## 2015-01-01 ENCOUNTER — Encounter (HOSPITAL_COMMUNITY): Payer: Self-pay | Admitting: Emergency Medicine

## 2015-01-01 DIAGNOSIS — Z8673 Personal history of transient ischemic attack (TIA), and cerebral infarction without residual deficits: Secondary | ICD-10-CM | POA: Insufficient documentation

## 2015-01-01 DIAGNOSIS — Z7901 Long term (current) use of anticoagulants: Secondary | ICD-10-CM | POA: Insufficient documentation

## 2015-01-01 DIAGNOSIS — Z87891 Personal history of nicotine dependence: Secondary | ICD-10-CM | POA: Insufficient documentation

## 2015-01-01 DIAGNOSIS — Z791 Long term (current) use of non-steroidal anti-inflammatories (NSAID): Secondary | ICD-10-CM | POA: Insufficient documentation

## 2015-01-01 DIAGNOSIS — G43909 Migraine, unspecified, not intractable, without status migrainosus: Secondary | ICD-10-CM | POA: Insufficient documentation

## 2015-01-01 DIAGNOSIS — Z8719 Personal history of other diseases of the digestive system: Secondary | ICD-10-CM | POA: Insufficient documentation

## 2015-01-01 DIAGNOSIS — I4891 Unspecified atrial fibrillation: Secondary | ICD-10-CM | POA: Diagnosis not present

## 2015-01-01 DIAGNOSIS — R079 Chest pain, unspecified: Secondary | ICD-10-CM

## 2015-01-01 DIAGNOSIS — Z79899 Other long term (current) drug therapy: Secondary | ICD-10-CM | POA: Insufficient documentation

## 2015-01-01 DIAGNOSIS — Z86010 Personal history of colonic polyps: Secondary | ICD-10-CM | POA: Insufficient documentation

## 2015-01-01 DIAGNOSIS — M1990 Unspecified osteoarthritis, unspecified site: Secondary | ICD-10-CM | POA: Insufficient documentation

## 2015-01-01 DIAGNOSIS — I10 Essential (primary) hypertension: Secondary | ICD-10-CM | POA: Insufficient documentation

## 2015-01-01 DIAGNOSIS — Z8669 Personal history of other diseases of the nervous system and sense organs: Secondary | ICD-10-CM | POA: Insufficient documentation

## 2015-01-01 LAB — CBC
HEMATOCRIT: 38.2 % (ref 36.0–46.0)
HEMOGLOBIN: 12.6 g/dL (ref 12.0–15.0)
MCH: 30.7 pg (ref 26.0–34.0)
MCHC: 33 g/dL (ref 30.0–36.0)
MCV: 93.2 fL (ref 78.0–100.0)
Platelets: 173 10*3/uL (ref 150–400)
RBC: 4.1 MIL/uL (ref 3.87–5.11)
RDW: 14.5 % (ref 11.5–15.5)
WBC: 6.5 10*3/uL (ref 4.0–10.5)

## 2015-01-01 LAB — I-STAT TROPONIN, ED
TROPONIN I, POC: 0 ng/mL (ref 0.00–0.08)
Troponin i, poc: 0 ng/mL (ref 0.00–0.08)

## 2015-01-01 LAB — BASIC METABOLIC PANEL
Anion gap: 11 (ref 5–15)
BUN: 10 mg/dL (ref 6–20)
CHLORIDE: 100 mmol/L — AB (ref 101–111)
CO2: 25 mmol/L (ref 22–32)
Calcium: 8.7 mg/dL — ABNORMAL LOW (ref 8.9–10.3)
Creatinine, Ser: 0.83 mg/dL (ref 0.44–1.00)
GFR calc Af Amer: >60 mL/min (ref >60)
GFR calc non Af Amer: >60 mL/min (ref >60)
GLUCOSE: 127 mg/dL — AB (ref 65–99)
POTASSIUM: 4 mmol/L (ref 3.5–5.1)
Sodium: 136 mmol/L (ref 135–145)

## 2015-01-01 LAB — TROPONIN I: Troponin I: <0.03 ng/mL (ref <0.031)

## 2015-01-01 NOTE — ED Notes (Signed)
NT at bedside to draw CBC, this RN unable to obtain enough blood for CBC specimen.

## 2015-01-01 NOTE — ED Notes (Signed)
Per EMS: pt from [facility] for eval of generalized cp that started 1 hour ago, pt states pressure in her chest with no radiation. Pt given 1 nitro and 324 mg of aspirin at [facility] with relief. Pt with 5/10 cp at this time. Pt alert and oriented, skin warm and dry.

## 2015-01-01 NOTE — ED Provider Notes (Signed)
CSN: [id number]     Arrival date & time 01/01/15  1304 History   First MD Initiated Contact with Patient 01/01/15 1339     Chief Complaint  Patient presents with  . Chest Pain      HPI Patient presents emergency department complaining of chest pain today.  She described her chest pain is central and sharp.  On my arrival to the patient's bedside she is without any chest discomfort at this time.  She currently is in a rehabilitation facility where she's been rehabilitating from her recent stroke.  No history of coronary artery disease.  She does have a history of atrial fibrillation and is on anticoagulation for that.  She denies palpitations.  No abdominal pain.  Denies nausea vomiting.  She reports in the chest discomfort came on she was short of breath.  She has no other complaints at this time.  She was with her daughters at the time who brought her to the ER for evaluation.   Past Medical History  Diagnosis Date  . Arthritis   . Hypertension   . Migraine   . Colon polyp   . Hearing difficulty   . Torus palatinus   . Atrial flutter ([place]) January, 2012  . Stroke ([place]) 08/02/12     right lenticular nucleus infarct  . Left leg weakness 02/05/2013  . Chronic anticoagulation   . High cholesterol   . Atrial fibrillation ([place])     on Eliquis Rx  . Mild cognitive impairment with memory loss   . Malnutrition ([place])   . TIA (transient ischemic attack)   . Constipation   . Osteoporosis   . Hemorrhoids    Past Surgical History  Procedure Laterality Date  . Cataract extraction w/ intraocular lens  implant, bilateral  2006-2008  . Ganglion cyst excision Bilateral 1938,1954,2003,2005    "wrists/hand" (08/01/2012)  . Cardioversion  05/19/2010    Dr. Einar Gip  . Tonsillectomy  ~ 1935  . Appendectomy  02/19/53    `  . Mouth surgery      [name]   Family History  Problem Relation Age of Onset  . Colon cancer Father   . Breast cancer    . Brain cancer    . Cancer Mother     breast  .  Aneurysm Mother     brain  . Heart attack Neg Hx   . Diabetes Neg Hx   . Hypertension Neg Hx   . Stroke Mother    Social History  Substance Use Topics  . Smoking status: Former Research scientist (life sciences)  . Smokeless tobacco: Never Used  . Alcohol Use: No     Comment: 08/01/2012 "glass of wine on special occasions"   OB History    No data available     Review of Systems  All other systems reviewed and are negative.     Allergies  Aricept; Tramadol hcl; and Alendronate sodium  Home Medications   Prior to Admission medications   Medication Sig Start Date End Date Taking? Authorizing Provider  acetaminophen (TYLENOL) 500 MG tablet Take 1,000 mg by mouth every 8 (eight) hours as needed for mild pain or headache.     Historical Provider, MD  amLODipine (NORVASC) 5 MG tablet Take 1 tablet (5 mg total) by mouth daily. 12/20/14   Shanker Kristeen Mans, MD  apixaban (ELIQUIS) 2.5 MG TABS tablet TAKE 1 TABLET (2.5 MG TOTAL) BY MOUTH 2 (TWO) TIMES DAILY. 11/27/14   Garvin Fila, MD  b complex vitamins  tablet Take 1 tablet by mouth 2 (two) times daily.     Historical Provider, MD  Calcium Carb-Cholecalciferol (CALCIUM 600 + D) 600-200 MG-UNIT TABS Take 1 tablet by mouth 2 (two) times daily.    Historical Provider, MD  Cholecalciferol (VITAMIN D) 2000 UNITS tablet Take 2,000 Units by mouth daily.    Historical Provider, MD  diphenhydramine-acetaminophen (TYLENOL PM) 25-500 MG TABS Take 1 tablet by mouth at bedtime as needed (sleep).     Historical Provider, MD  divalproex (DEPAKOTE) 125 MG DR tablet Take 2 tablets (250 mg total) by mouth 2 (two) times daily. 07/25/14   Garvin Fila, MD  Docusate Sodium (COLACE PO) Take by mouth daily.     Historical Provider, MD  Melatonin 3 MG TABS Take 1 tablet by mouth at bedtime    Historical Provider, MD  Multiple Vitamin (MULTIVITAMIN WITH MINERALS) TABS Take 1 tablet by mouth daily.    Historical Provider, MD  ondansetron (ZOFRAN) 4 MG tablet Take 1 tablet (4 mg total) by  mouth every 6 (six) hours as needed for nausea. 10/31/14   Delfina Redwood, MD  oxybutynin (DITROPAN) 5 MG tablet Take 1 tablet (5 mg total) by mouth 2 (two) times daily. 12/11/14   Gildardo Cranker, DO  pantoprazole (PROTONIX) 40 MG tablet Take 1 tablet (40 mg total) by mouth daily at 12 noon. 12/20/14   Shanker Kristeen Mans, MD  Protein (PROCEL PO) One Scoop twice daily for protein    Historical Provider, MD   BP 142/62 mmHg  Pulse 66  Temp(Src) 98.2 F (36.8 C) (Oral)  Resp 20  SpO2 95% Physical Exam  Constitutional: She is oriented to person, place, and time. She appears well-developed and well-nourished. No distress.  HENT:  Head: Normocephalic and atraumatic.  Eyes: EOM are normal.  Neck: Normal range of motion.  Cardiovascular: Normal rate, regular rhythm and normal heart sounds.   Pulmonary/Chest: Effort normal and breath sounds normal.  Abdominal: Soft. She exhibits no distension. There is no tenderness.  Musculoskeletal: Normal range of motion.  Neurological: She is alert and oriented to person, place, and time.  Skin: Skin is warm and dry.  Psychiatric: She has a normal mood and affect. Judgment normal.  Nursing note and vitals reviewed.   ED Course  Procedures (including critical care time) Labs Review Labs Reviewed  BASIC METABOLIC PANEL - Abnormal; Notable for the following:    Chloride 100 (*)    Glucose, Bld 127 (*)    Calcium 8.7 (*)    All other components within normal limits  TROPONIN I  CBC  I-STAT TROPOININ, ED  Randolm Idol, ED    Imaging Review Dg Chest 2 View  01/01/2015   CLINICAL DATA:  Chest pain.  EXAM: CHEST  2 VIEW  COMPARISON:  10/30/2014.  FINDINGS: Mediastinum and hilar structures normal. Low lung volumes with bibasilar atelectasis and/or infiltrates. Cardiomegaly with normal pulmonary vascularity. No acute bony abnormality.  IMPRESSION: 1. Low lung volumes with mild bibasilar atelectasis and/or infiltrates. 2. Cardiomegaly.  No pulmonary  venous congestion.   Electronically Signed   By: Marcello Moores  Register   On: 01/01/2015 14:07   I have personally reviewed and evaluated these images and lab results as part of my medical decision-making.   EKG Interpretation   Date/Time:  Wednesday January 01 2015 13:08:25 EDT Ventricular Rate:  70 PR Interval:    QRS Duration: 87 QT Interval:  401 QTC Calculation: 433 R Axis:   -15 Text Interpretation:  Atrial flutter with predominant 4:1 AV block  Borderline left axis deviation Borderline repolarization abnormality No  significant change was found Confirmed by Turon Kilmer  MD, Abdon Petrosky ([id number]) on  01/01/2015 1:40:23 PM      MDM   Final diagnoses:  None    Patient with chronic atrial fib atrial flutter.  EKG unchanged from priors.  Troponin 1 is negative.  Repeat troponin pending at this time.  My suspicion that this is acute coronary syndrome is low.  This could represent esophageal spasm.  I do think she'll need follow-up with her primary care physician and will benefit from follow-up with cardiology given her history of atrial flutter as well as her advanced age of 39.  I think a more conservative approach is reasonable in this elderly female.  I'll make sure she is on 81 mg aspirin daily.    Jola Schmidt, MD 01/01/15 [phone]

## 2015-01-01 NOTE — Discharge Instructions (Signed)
Nonspecific Chest Pain  °Chest pain can be caused by many different conditions. There is always a chance that your pain could be related to something serious, such as a heart attack or a blood clot in your lungs. Chest pain can also be caused by conditions that are not life-threatening. If you have chest pain, it is very important to follow up with your health care provider. °CAUSES  °Chest pain can be caused by: °· Heartburn. °· Pneumonia or bronchitis. °· Anxiety or stress. °· Inflammation around your heart (pericarditis) or lung (pleuritis or pleurisy). °· A blood clot in your lung. °· A collapsed lung (pneumothorax). It can develop suddenly on its own (spontaneous pneumothorax) or from trauma to the chest. °· Shingles infection (varicella-zoster virus). °· Heart attack. °· Damage to the bones, muscles, and cartilage that make up your chest wall. This can include: °¨ Bruised bones due to injury. °¨ Strained muscles or cartilage due to frequent or repeated coughing or overwork. °¨ Fracture to one or more ribs. °¨ Sore cartilage due to inflammation (costochondritis). °RISK FACTORS  °Risk factors for chest pain may include: °· Activities that increase your risk for trauma or injury to your chest. °· Respiratory infections or conditions that cause frequent coughing. °· Medical conditions or overeating that can cause heartburn. °· Heart disease or family history of heart disease. °· Conditions or health behaviors that increase your risk of developing a blood clot. °· Having had chicken pox (varicella zoster). °SIGNS AND SYMPTOMS °Chest pain can feel like: °· Burning or tingling on the surface of your chest or deep in your chest. °· Crushing, pressure, aching, or squeezing pain. °· Dull or sharp pain that is worse when you move, cough, or take a deep breath. °· Pain that is also felt in your back, neck, shoulder, or arm, or pain that spreads to any of these areas. °Your chest pain may come and go, or it may stay  constant. °DIAGNOSIS °Lab tests or other studies may be needed to find the cause of your pain. Your health care provider may have you take a test called an ambulatory ECG (electrocardiogram). An ECG records your heartbeat patterns at the time the test is performed. You may also have other tests, such as: °· Transthoracic echocardiogram (TTE). During echocardiography, sound waves are used to create a picture of all of the heart structures and to look at how blood flows through your heart. °· Transesophageal echocardiogram (TEE). This is a more advanced imaging test that obtains images from inside your body. It allows your health care provider to see your heart in finer detail. °· Cardiac monitoring. This allows your health care provider to monitor your heart rate and rhythm in real time. °· Holter monitor. This is a portable device that records your heartbeat and can help to diagnose abnormal heartbeats. It allows your health care provider to track your heart activity for several days, if needed. °· Stress tests. These can be done through exercise or by taking medicine that makes your heart beat more quickly. °· Blood tests. °· Imaging tests. °TREATMENT  °Your treatment depends on what is causing your chest pain. Treatment may include: °· Medicines. These may include: °¨ Acid blockers for heartburn. °¨ Anti-inflammatory medicine. °¨ Pain medicine for inflammatory conditions. °¨ Antibiotic medicine, if an infection is present. °¨ Medicines to dissolve blood clots. °¨ Medicines to treat coronary artery disease. °· Supportive care for conditions that do not require medicines. This may include: °¨ Resting. °¨ Applying heat   or cold packs to injured areas. °¨ Limiting activities until pain decreases. °HOME CARE INSTRUCTIONS °· If you were prescribed an antibiotic medicine, finish it all even if you start to feel better. °· Avoid any activities that bring on chest pain. °· Do not use any tobacco products, including  cigarettes, chewing tobacco, or electronic cigarettes. If you need help quitting, ask your health care provider. °· Do not drink alcohol. °· Take medicines only as directed by your health care provider. °· Keep all follow-up visits as directed by your health care provider. This is important. This includes any further testing if your chest pain does not go away. °· If heartburn is the cause for your chest pain, you may be told to keep your head raised (elevated) while sleeping. This reduces the chance that acid will go from your stomach into your esophagus. °· Make lifestyle changes as directed by your health care provider. These may include: °¨ Getting regular exercise. Ask your health care provider to suggest some activities that are safe for you. °¨ Eating a heart-healthy diet. A registered dietitian can help you to learn healthy eating options. °¨ Maintaining a healthy weight. °¨ Managing diabetes, if necessary. °¨ Reducing stress. °SEEK MEDICAL CARE IF: °· Your chest pain does not go away after treatment. °· You have a rash with blisters on your chest. °· You have a fever. °SEEK IMMEDIATE MEDICAL CARE IF:  °· Your chest pain is worse. °· You have an increasing cough, or you cough up blood. °· You have severe abdominal pain. °· You have severe weakness. °· You faint. °· You have chills. °· You have sudden, unexplained chest discomfort. °· You have sudden, unexplained discomfort in your arms, back, neck, or jaw. °· You have shortness of breath at any time. °· You suddenly start to sweat, or your skin gets clammy. °· You feel nauseous or you vomit. °· You suddenly feel light-headed or dizzy. °· Your heart begins to beat quickly, or it feels like it is skipping beats. °These symptoms may represent a serious problem that is an emergency. Do not wait to see if the symptoms will go away. Get medical help right away. Call your local emergency services (911 in the [place] Do not drive yourself to the hospital. °  °This  information is not intended to replace advice given to you by your health care provider. Make sure you discuss any questions you have with your health care provider. °  °Document Released: 12/23/2004 Document Revised: 04/05/2014 Document Reviewed: 10/19/2013 °Elsevier Interactive Patient Education ©2016 [employer] ° °

## 2015-01-13 ENCOUNTER — Encounter: Payer: Medicare Other | Admitting: Nurse Practitioner

## 2015-01-22 ENCOUNTER — Non-Acute Institutional Stay (SKILLED_NURSING_FACILITY): Payer: Medicare Other | Admitting: Adult Health

## 2015-01-22 ENCOUNTER — Encounter: Payer: Self-pay | Admitting: Adult Health

## 2015-01-22 DIAGNOSIS — G3184 Mild cognitive impairment, so stated: Secondary | ICD-10-CM

## 2015-01-22 DIAGNOSIS — I482 Chronic atrial fibrillation, unspecified: Secondary | ICD-10-CM

## 2015-01-22 DIAGNOSIS — N3281 Overactive bladder: Secondary | ICD-10-CM

## 2015-01-22 DIAGNOSIS — K219 Gastro-esophageal reflux disease without esophagitis: Secondary | ICD-10-CM | POA: Diagnosis not present

## 2015-01-22 DIAGNOSIS — Z8673 Personal history of transient ischemic attack (TIA), and cerebral infarction without residual deficits: Secondary | ICD-10-CM

## 2015-01-22 DIAGNOSIS — I10 Essential (primary) hypertension: Secondary | ICD-10-CM | POA: Diagnosis not present

## 2015-01-22 DIAGNOSIS — G4700 Insomnia, unspecified: Secondary | ICD-10-CM

## 2015-01-22 DIAGNOSIS — E46 Unspecified protein-calorie malnutrition: Secondary | ICD-10-CM | POA: Diagnosis not present

## 2015-01-22 DIAGNOSIS — K5900 Constipation, unspecified: Secondary | ICD-10-CM | POA: Diagnosis not present

## 2015-01-22 NOTE — Progress Notes (Signed)
Patient ID: [name], female   DOB: 09-02-1924, 81 y.o.   MRN: [id number]    DATE:  01/22/15  MRN:  [id number]  BIRTHDAY: October 25, 1924  Facility:  Nursing Home Location:  [place] Room Number: 606-P  LEVEL OF CARE:  [phone]  Contact Information    Name [place] Daughter [phone]  [phone]   Plummer,Kim Daughter [phone]   [place] Daughter [phone]  [phone]      Chief Complaint  Patient presents with  . Discharge Note    TIA, hypertension, hyperlipidemia, atrial fibrillation, mild cognitive impairment, GERD, constipation, insomnia and overactive bladder    HISTORY OF PRESENT ILLNESS:  This is an 81 year old female who is for discharge home. She has been admitted to [facility] on 12/21/14 from [facility]. She has PMH of arthritis, hypertension, healing difficulty, stroke, atrial fibrillation on Eliquis, TIA and osteoporosis. She presented to the ED with worsening left upper extremity weakness, confusion and LLQ abdominal pain. UA was positive for large leukocytes. Treated with 3 days ceftriaxone. She had left arm weakness since prior CVA and likely worsened in the setting of UTI. Improved since IV antibiotics started. Left arm weakness is now back to baseline.  Patient was admitted to this facility for short-term rehabilitation after the patient's recent hospitalization.  Patient has completed SNF rehabilitation and therapy has cleared the patient for discharge.  PAST MEDICAL HISTORY:  Past Medical History  Diagnosis Date  . Arthritis   . Hypertension   . Migraine   . Colon polyp   . Hearing difficulty   . Torus palatinus   . Atrial flutter ([place]) January, 2012  . Stroke ([place]) 08/02/12     right lenticular nucleus infarct  . Left leg weakness 02/05/2013  . Chronic anticoagulation   . High cholesterol   . Atrial fibrillation ([place])     on Eliquis Rx    . Mild cognitive impairment with memory loss   . Malnutrition ([place])   . TIA (transient ischemic attack)   . Constipation   . Osteoporosis   . Hemorrhoids      CURRENT MEDICATIONS: Reviewed   Patient's Medications  New Prescriptions   No medications on file  Previous Medications   ACETAMINOPHEN (TYLENOL) 500 MG TABLET    Take 1,000 mg by mouth every 8 (eight) hours as needed for mild pain or headache.    AMLODIPINE (NORVASC) 5 MG TABLET    Take 1 tablet (5 mg total) by mouth daily.   APIXABAN (ELIQUIS) 2.5 MG TABS TABLET    TAKE 1 TABLET (2.5 MG TOTAL) BY MOUTH 2 (TWO) TIMES DAILY.   B COMPLEX VITAMINS TABLET    Take 1 tablet by mouth 2 (two) times daily.    CALCIUM CARB-CHOLECALCIFEROL (CALCIUM 600 + D) 600-200 MG-UNIT TABS    Take 1 tablet by mouth 2 (two) times daily.   CHOLECALCIFEROL (VITAMIN D) 2000 UNITS TABLET    Take 2,000 Units by mouth daily.   DIPHENHYDRAMINE-ACETAMINOPHEN (TYLENOL PM) 25-500 MG TABS    Take 1 tablet by mouth at bedtime as needed (sleep).    DIVALPROEX (DEPAKOTE) 125 MG DR TABLET    Take 2 tablets (250 mg total) by mouth 2 (two) times daily.   DOCUSATE SODIUM (COLACE PO)    Take 100 mg by mouth 2 (two) times daily.    MELATONIN 3 MG TABS    Take 1 tablet by mouth  at bedtime   MULTIPLE VITAMIN (MULTIVITAMIN WITH MINERALS) TABS    Take 1 tablet by mouth daily.   ONDANSETRON (ZOFRAN) 4 MG TABLET    Take 1 tablet (4 mg total) by mouth every 6 (six) hours as needed for nausea.   OXYBUTYNIN (DITROPAN) 5 MG TABLET    Take 1 tablet (5 mg total) by mouth 2 (two) times daily.   PANTOPRAZOLE (PROTONIX) 40 MG TABLET    Take 1 tablet (40 mg total) by mouth daily at 12 noon.   PROTEIN (PROCEL PO)    One Scoop twice daily for protein  Modified Medications   No medications on file  Discontinued Medications   No medications on file     Allergies  Allergen Reactions  . Aricept [Donepezil Hcl] Hypertension    With N/V  . Tramadol Hcl Other (See Comments)      lethargy, nausea  . Alendronate Sodium Rash and Other (See Comments)    puffy eyes     REVIEW OF SYSTEMS:  GENERAL: no change in appetite, no fatigue, no weight changes, no fever, chills or weakness EYES: Denies change in vision, dry eyes, eye pain, itching or discharge EARS: Denies change in hearing, ringing in ears, or earache NOSE: Denies nasal congestion or epistaxis MOUTH and THROAT: Denies oral discomfort, gingival pain or bleeding, pain from teeth or hoarseness   RESPIRATORY: no cough, SOB, DOE, wheezing, hemoptysis CARDIAC: no chest pain, edema or palpitations GI: no abdominal pain, diarrhea, constipation, heart burn, nausea or vomiting GU: Denies dysuria, frequency, hematuria, incontinence, or discharge PSYCHIATRIC: Denies feeling of depression or anxiety. No report of hallucinations, insomnia, paranoia, or agitation   PHYSICAL EXAMINATION  GENERAL APPEARANCE: Well nourished. In no acute distress.  HEAD: Normal in size and contour. No evidence of trauma EYES: Lids open and close normally. No blepharitis, entropion or ectropion. PERRL. Conjunctivae are clear and sclerae are white. Lenses are without opacity EARS: Pinnae are normal. Patient hears normal voice tunes of the examiner MOUTH and THROAT: Lips are without lesions. Oral mucosa is moist and without lesions. Tongue is normal in shape, size, and color and without lesions NECK: supple, trachea midline, no neck masses, no thyroid tenderness, no thyromegaly LYMPHATICS: no LAN in the neck, no supraclavicular LAN RESPIRATORY: breathing is even & unlabored, BS CTAB CARDIAC: RRR, no murmur,no extra heart sounds, no edema GI: abdomen soft, normal BS, no masses, no tenderness, no hepatomegaly, no splenomegaly EXTREMITIES:  Left upper and lower extremities are weak PSYCHIATRIC: Alert and oriented X 3. Affect and behavior are appropriate  LABS/RADIOLOGY: Labs reviewed: Basic Metabolic Panel:  Recent Labs  12/18/14 0144  12/18/14 0151 12/19/14 0433 01/01/15 1311  NA 140 141 138 136  K 4.5 4.4 3.9 4.0  CL 104 103 102 100*  CO2 27  --  31 25  GLUCOSE 99 98 106* 127*  BUN 20 22* 16 10  CREATININE 1.03* 1.00 0.95 0.83  CALCIUM 9.2  --  8.4* 8.7*   Liver Function Tests:  Recent Labs  06/24/14 1132 10/30/14 2300 12/18/14 0144  AST _0 ALT 12 12* 9*  ALKPHOS 41 51 55  BILITOT 0.5 0.4 0.4  PROT 6.7 6.4* 5.7*  ALBUMIN 3.8 3.6 3.3*    Recent Labs  10/30/14 2300  LIPASE 44   CBC:  Recent Labs  06/24/14 1132 10/30/14 2300 10/31/14 0407 12/18/14 0144 12/18/14 0151 01/01/15 1430  WBC 5.2 8.5 5.2 4.9  --  6.5  NEUTROABS 3.2 6.8  --  2.2  --   --   HGB 14.3 13.0 12.7 12.9 13.6 12.6  HCT 42.7 40.0 38.3 40.1 40.0 38.2  MCV 93.9 93.7 92.7 94.1  --  93.2  PLT 174.0 164 169 173  --  173   Lipid Panel:  Recent Labs  06/24/14 1132 12/19/14 0433  HDL 81.70 47   Cardiac Enzymes:  Recent Labs  10/31/14 0908 10/31/14 1510 01/01/15 1311  TROPONINI <0.03 <0.03 <0.03   CBG: No results for input(s): GLUCAP in the last 8760 hours.   Dg Chest 2 View  01/01/2015  CLINICAL DATA:  Chest pain. EXAM: CHEST  2 VIEW COMPARISON:  10/30/2014. FINDINGS: Mediastinum and hilar structures normal. Low lung volumes with bibasilar atelectasis and/or infiltrates. Cardiomegaly with normal pulmonary vascularity. No acute bony abnormality. IMPRESSION: 1. Low lung volumes with mild bibasilar atelectasis and/or infiltrates. 2. Cardiomegaly.  No pulmonary venous congestion. Electronically Signed   By: [place]   On: 01/01/2015 14:07    ASSESSMENT/PLAN:   TIA - MRI of brain shows no acute infarcts; recent echo shows preserved EF and no embolic foci; continue Eliquis  Hypertension - continue amlodipine 5 mg 1 tab by mouth daily  Hyperlipidemia - intolerant to statin; diet and exercise  Atrial fibrillation - rate controlled; continue Eliquis 2.5 mg 1 tab by mouth twice a day  Mild cognitive  impairment with memory loss - intolerant to Aricept  GERD - continue Protonix 40 mg 1 tab by mouth every 12 noon  Constipation - continue Colace 100 mg by mouth twice a day  Insomnia - continue melatonin 3 mg 1 tab by mouth daily at bedtime  Overactive bladder - continue oxybutynin 5 mg by mouth twice a day       I have filled out patient's discharge paperwork and written prescriptions.    Total discharge time: Less than 30 minutes  Discharge time involved coordination of the discharge process with Education officer, museum, nursing staff and therapy department.      [facility], NP [employer] [phone]

## 2015-02-06 ENCOUNTER — Encounter: Payer: Self-pay | Admitting: Neurology

## 2015-02-06 ENCOUNTER — Ambulatory Visit (INDEPENDENT_AMBULATORY_CARE_PROVIDER_SITE_OTHER): Payer: Medicare Other | Admitting: Neurology

## 2015-02-06 VITALS — BP 149/93 | HR 98 | Ht 66.0 in | Wt 130.6 lb

## 2015-02-06 DIAGNOSIS — R260 Ataxic gait: Secondary | ICD-10-CM | POA: Diagnosis not present

## 2015-02-06 DIAGNOSIS — I639 Cerebral infarction, unspecified: Secondary | ICD-10-CM

## 2015-02-06 NOTE — Patient Instructions (Signed)
I had a long discussion with the patient and her daughters regarding her gait and balance difficulties and deconditioning following her recent hospitalization. I recommend of ambulatory referral to physical therapy for gait and balance training. We also discussed fall and safety precautions. Patient does have mild cognitive difficulties but has not done well on Aricept in the past and does not want to try other medications at the present time. She will stay on eliquis for stroke prevention and maintain strict control of hypertension with blood pressure goal below 130/90 She will return for follow-up in 6 months or call earlier if necessary. Fall Prevention in the Home  Falls can cause injuries and can affect people from all age groups. There are many simple things that you can do to make your home safe and to help prevent falls. WHAT CAN I DO ON THE OUTSIDE OF MY HOME?  Regularly repair the edges of walkways and driveways and fix any cracks.  Remove high doorway thresholds.  Trim any shrubbery on the main path into your home.  Use bright outdoor lighting.  Clear walkways of debris and clutter, including tools and rocks.  Regularly check that handrails are securely fastened and in good repair. Both sides of any steps should have handrails.  Install guardrails along the edges of any raised decks or porches.  Have leaves, snow, and ice cleared regularly.  Use sand or salt on walkways during winter months.  In the garage, clean up any spills right away, including grease or oil spills. WHAT CAN I DO IN THE BATHROOM?  Use night lights.  Install grab bars by the toilet and in the tub and shower. Do not use towel bars as grab bars.  Use non-skid mats or decals on the floor of the tub or shower.  If you need to sit down while you are in the shower, use a plastic, non-slip stool.Marland Kitchen  Keep the floor dry. Immediately clean up any water that spills on the floor.  Remove soap buildup in the tub  or shower on a regular basis.  Attach bath mats securely with double-sided non-slip rug tape.  Remove throw rugs and other tripping hazards from the floor. WHAT CAN I DO IN THE BEDROOM?  Use night lights.  Make sure that a bedside light is easy to reach.  Do not use oversized bedding that drapes onto the floor.  Have a firm chair that has side arms to use for getting dressed.  Remove throw rugs and other tripping hazards from the floor. WHAT CAN I DO IN THE KITCHEN?   Clean up any spills right away.  Avoid walking on wet floors.  Place frequently used items in easy-to-reach places.  If you need to reach for something above you, use a sturdy step stool that has a grab bar.  Keep electrical cables out of the way.  Do not use floor polish or wax that makes floors slippery. If you have to use wax, make sure that it is non-skid floor wax.  Remove throw rugs and other tripping hazards from the floor. WHAT CAN I DO IN THE STAIRWAYS?  Do not leave any items on the stairs.  Make sure that there are handrails on both sides of the stairs. Fix handrails that are broken or loose. Make sure that handrails are as long as the stairways.  Check any carpeting to make sure that it is firmly attached to the stairs. Fix any carpet that is loose or worn.  Avoid having throw  rugs at the top or bottom of stairways, or secure the rugs with carpet tape to prevent them from moving.  Make sure that you have a light switch at the top of the stairs and the bottom of the stairs. If you do not have them, have them installed. WHAT ARE SOME OTHER FALL PREVENTION TIPS?  Wear closed-toe shoes that fit well and support your feet. Wear shoes that have rubber soles or low heels.  When you use a stepladder, make sure that it is completely opened and that the sides are firmly locked. Have someone hold the ladder while you are using it. Do not climb a closed stepladder.  Add color or contrast paint or tape to  grab bars and handrails in your home. Place contrasting color strips on the first and last steps.  Use mobility aids as needed, such as canes, walkers, scooters, and crutches.  Turn on lights if it is dark. Replace any light bulbs that burn out.  Set up furniture so that there are clear paths. Keep the furniture in the same spot.  Fix any uneven floor surfaces.  Choose a carpet design that does not hide the edge of steps of a stairway.  Be aware of any and all pets.  Review your medicines with your healthcare provider. Some medicines can cause dizziness or changes in blood pressure, which increase your risk of falling. Talk with your health care provider about other ways that you can decrease your risk of falls. This may include working with a physical therapist or trainer to improve your strength, balance, and endurance.   This information is not intended to replace advice given to you by your health care provider. Make sure you discuss any questions you have with your health care provider.   Document Released: 03/05/2002 Document Revised: 07/30/2014 Document Reviewed: 04/19/2014 Elsevier Interactive Patient Education [employer].

## 2015-02-06 NOTE — Progress Notes (Signed)
[facility] [address] [place]. [place] [address] [phone]       OFFICE FOLLOW-UP NOTE  [name] Date of Birth:  January 12, 1925 Medical Record Number:  [id number]   HPI: 47 year Caucasian lady seen for first office follow for the following hospital admission on 03/09/13 for left hemiparesis. She has remote history of right brain subcortical infarct in May 2014 felt to be secondary to atrial fibrillation has been on anticoagulation with cerebral since then. She was admitted multiple times with worsening of hemiparesis in the setting of infection or dehydration. She had an MRI in December 2014 which did not reveal any new right brain stroke but showed and he of restricted diffusion which is patchy in the left parietal region. At that time it was not clear whether this was a stroke or not however the patient's daughter was informed today that she had indeed had a fall a month prior in November 2014 that she had sustained scalp hematoma in the left temporal region and had been seen in the ER and a solitary CT scan of the head was unremarkable and she was sent back to the nursing home. In retrospect the MRI diffusion abnormality in the left parietal region in December 2014 they represent a delayed hemorrhage contusion which may have sustained from a fall in November 2014. It was planned to change as there are 2 to a liquids but the family never made the switch as they were unclear as to what the co-pay for a liquids would cost him. The patient seems to be tolerating that well without any further increased bleeding or bruising. She suffered from a bad cold for 2 weeks and had a minor nasal bleed which stopped. She is currently living at home and getting physical and occupational therapy. She is able to walk with a walker fairly well. She is carefully washed over by 3 daughters. She has had some mild memory difficulties but these are not progressive. Update 11/15/2013 : She returns  for followup of her last visit in March 2015. She did benefit from outpatient physical and occupational therapy but was readmitted in May and was found to have a right corpus callosum infarct as well as bladder infection. I have personally reviewed imaging studies and hospital workup. She was on Xarelto which was switched to eliquis 2.5mg  twice daily. She had a prolonged stay in rehabilitation at [facility] and has just finished home physical and occupational therapy recently. She can walk with a walker but does lean forward sent to the right and requires one-person assist. She still has some residual left-sided weakness from her recent stroke. She is impulsive and does not always call for help. The family feels she is unsafe to walk by herself. The patient is quite frustrated about the situation. Update 07/25/2014 : She returns for follow-up after last visit 8 months ago. She is accompanied by 3 of her daughters who reports that they've noticed some cognitive decline and memory loss since last visit. Patient has been in and out of the hospital this last year multiple times with strokes as well as dehydration and bladder infections. She is presently living at home and has 24-hour care. She spends most of the time in a wheelchair and does not have much social interaction and increases to go out. She is able to toe walk with one-person assist. She has some home therapy every day which makes her walk a little bit. She is tolerating liquids without bleeding  bruising or other side effects. She has not had headaches for quite some time and no definite documented seizures last couple of years. She is currently on valproic acid 375 twice daily. Update 10/29/2014 : She returns for follow-up after last visit 4 months ago. She is accompanied by her daughter who states that her memory and cognitive difficulties are about the same but in fact on Mini-Mental status testing today she shows a decline with score of 17/30. Clock  drawing is only 1/4 in am and naming is only 6. The patient has had complaints of nocturia and has to get up several times at night and does not sleep well at night. She has been to a urologist as well as family physician but the testing for infection has been negative. The patient is not very active during the day and does not engage and interaction. There have been no no issues with agitation, delusions or hallucinations. The family is willing to try Aricept but have concerns about possible side effects. They also want to try some natural supplements to help her sleep but her reluctant to take any sleeping pills. Update 02/06/2015 : She returns for f/u after last visit 3 months ago. She was admitted to [facility] on 12/18/14 with worsening weakness and gait difficulties and was found to have UTI and dehydration and was deconditioned.MRI showed no acute infarct. She was transferred to SNF for rehab and has slowly improved but is not participating as much with therapy and barely walks and needs 2 person assist. She at home now with her daughters but does not walk as much as she used to prior to her recent hospitalization.She tried aricept in [place] but did not tolerate it due to GI side effects. ROS:   14 system review of systems is positive for  activity and  , fatigue, daytime sleepiness,weakness,back pain,confusion, gait difficulty and all other systems negative PMH:  Past Medical History  Diagnosis Date  . Arthritis   . Hypertension   . Migraine   . Colon polyp   . Hearing difficulty   . Torus palatinus   . Atrial flutter ([place]) January, 2012  . Stroke ([place]) 08/02/12     right lenticular nucleus infarct  . Left leg weakness 02/05/2013  . Chronic anticoagulation   . High cholesterol   . Atrial fibrillation ([place])     on Eliquis Rx  . Mild cognitive impairment with memory loss   . Malnutrition ([place])   . TIA (transient ischemic attack)   . Constipation   . Osteoporosis   . Hemorrhoids     Social  History:  Social History   Social History  . Marital Status: Widowed    Spouse Name: N/A  . Number of Children: 3  . Years of Education: college   Occupational History  . editorial work for [employer]    Social History Main Topics  . Smoking status: Former Research scientist (life sciences)  . Smokeless tobacco: Never Used  . Alcohol Use: No     Comment: 08/01/2012 "glass of wine on special occasions"  . Drug Use: No  . Sexual Activity: No   Other Topics Concern  . Not on file   Social History Narrative   Patient lives at home with daughters.   Caffeine use: 1/2-1 cup of coffee occasionally         Diet: Regular      Do you drink/ eat things with caffeine? Yes      Marital status:   Widowed  What year were you married ?       Do you live in a house, apartment,assistred living, condo, trailer, etc.)? House      Is it one or more stories? Yes      How many persons live in your home ? 4      Do you have any pets in your home ?(please list) Yes/ 1 Dog, 2 Cats      Current or past profession: Editor      Do you exercise? No                             Type & how often:      Do you have a living will? Yes      Do you have a DNR form?  Yes                     If not, do you want to discuss one?       Do you have signed POA?HPOA forms? Yes                If so, please bring to your        appointment      regular    Medications:   Current Outpatient Prescriptions on File Prior to Visit  Medication Sig Dispense Refill  . acetaminophen (TYLENOL) 500 MG tablet Take 1,000 mg by mouth every 8 (eight) hours as needed for mild pain or headache.     Marland Kitchen apixaban (ELIQUIS) 2.5 MG TABS tablet TAKE 1 TABLET (2.5 MG TOTAL) BY MOUTH 2 (TWO) TIMES DAILY. 180 tablet 0  . b complex vitamins tablet Take 1 tablet by mouth 2 (two) times daily.     . Calcium Carb-Cholecalciferol (CALCIUM 600 + D) 600-200 MG-UNIT TABS Take 1 tablet by mouth 2 (two) times daily.    . Cholecalciferol  (VITAMIN D) 2000 UNITS tablet Take 2,000 Units by mouth daily.    . diphenhydramine-acetaminophen (TYLENOL PM) 25-500 MG TABS Take 1 tablet by mouth at bedtime as needed (sleep).     . divalproex (DEPAKOTE) 125 MG DR tablet Take 2 tablets (250 mg total) by mouth 2 (two) times daily. 540 tablet 1  . Docusate Sodium (COLACE PO) Take 100 mg by mouth 2 (two) times daily.     . Melatonin 3 MG TABS Take 1 tablet by mouth at bedtime    . Multiple Vitamin (MULTIVITAMIN WITH MINERALS) TABS Take 1 tablet by mouth daily.    . ondansetron (ZOFRAN) 4 MG tablet Take 1 tablet (4 mg total) by mouth every 6 (six) hours as needed for nausea. 10 tablet 0  . oxybutynin (DITROPAN) 5 MG tablet Take 1 tablet (5 mg total) by mouth 2 (two) times daily. 60 tablet 2   No current facility-administered medications on file prior to visit.    Allergies:   Allergies  Allergen Reactions  . Aricept [Donepezil Hcl] Hypertension    With N/V  . Tramadol Hcl Other (See Comments)     lethargy, nausea  . Alendronate Sodium Rash and Other (See Comments)    puffy eyes    Physical Exam General: Frail malnourished looking elderly Caucasian lady seated, in no evident distress Head: head normocephalic and atraumatic.   Neck: supple with no carotid or supraclavicular bruits Cardiovascular: regular rate and rhythm, no murmurs Musculoskeletal: no deformity Skin:  no rash/petichiae Vascular:  Normal pulses all extremities Filed  Vitals:   02/06/15 1351  BP: 149/93  Pulse: 98   Neurologic Exam Mental Status: Awake and fully alert. Oriented to place and time. Recent and remote memory intact. Attention span, concentration and fund of knowledge slightly diminished. Mood and affect appropriate. Mini-Mental status exam not done but has deficits in orientation, attention, calculation, recall.    Cranial Nerves: Fundoscopic exam not done . Pupils equal, briskly reactive to light. Extraocular movements full without nystagmus. Visual  fields full to confrontation. Hearing diminished slightly bilaterally.. Facial sensation intact. Face, tongue, palate moves normally and symmetrically.  Motor: Normal bulk and tone. Normal strength in all tested extremity muscles except mild weakness of left grip and intrinsic hand muscles. Mild left hemiparesis 4/5. Mild drift left upper and lower extremity Diminished fine finger movements on the left. Orbits right over left upper extremity. Sensory.: intact to touch and pinprick and vibratory sensation.  Coordination: Rapid alternating movements normal in all extremities. Finger-to-nose and heel-to-shin performed accurately bilaterally. Gait and Station: Deferred as patient sitting in wheelchair and fall risk  Reflexes: 1+ and symmetric. Toes downgoing.      ASSESSMENT: 67 year with remote history of right hemispheric subcortical infarct in May 2014 with recent admission in  May 2015 with new right corpus callosal embolic infarct from atrial fibrillation. She has residual mild left hemiparesis and poor balance and gait difficulties. New worsening gait difficulties likely due to deconditioning from recent hospitalization PLAN: I had a long discussion with the patient and her daughters regarding her gait and balance difficulties and deconditioning following her recent hospitalization. I recommend of ambulatory referral to physical therapy for gait and balance training. We also discussed fall and safety precautions. Patient does have mild cognitive difficulties but has not done well on Aricept in the past and does not want to try other medications at the present time. She will stay on eliquis for stroke prevention and maintain strict control of hypertension with blood pressure goal below 130/90 She will return for follow-up in 6 months or call earlier if necessary.   Antony Contras, MD   Note: This document was prepared with digital dictation and possible smart phrase technology. Any transcriptional  errors that result from this process are unintentional

## 2015-02-07 ENCOUNTER — Encounter: Payer: Self-pay | Admitting: Internal Medicine

## 2015-02-07 ENCOUNTER — Ambulatory Visit (INDEPENDENT_AMBULATORY_CARE_PROVIDER_SITE_OTHER): Payer: Medicare Other | Admitting: Internal Medicine

## 2015-02-07 VITALS — BP 128/76 | HR 86 | Temp 97.8°F | Resp 20 | Ht 66.0 in | Wt 129.4 lb

## 2015-02-07 DIAGNOSIS — I10 Essential (primary) hypertension: Secondary | ICD-10-CM

## 2015-02-07 DIAGNOSIS — E785 Hyperlipidemia, unspecified: Secondary | ICD-10-CM | POA: Diagnosis not present

## 2015-02-07 DIAGNOSIS — N3941 Urge incontinence: Secondary | ICD-10-CM | POA: Diagnosis not present

## 2015-02-07 DIAGNOSIS — G3184 Mild cognitive impairment, so stated: Secondary | ICD-10-CM | POA: Diagnosis not present

## 2015-02-07 DIAGNOSIS — I482 Chronic atrial fibrillation, unspecified: Secondary | ICD-10-CM

## 2015-02-07 DIAGNOSIS — K648 Other hemorrhoids: Secondary | ICD-10-CM | POA: Diagnosis not present

## 2015-02-07 DIAGNOSIS — K5900 Constipation, unspecified: Secondary | ICD-10-CM | POA: Diagnosis not present

## 2015-02-07 DIAGNOSIS — Z0000 Encounter for general adult medical examination without abnormal findings: Secondary | ICD-10-CM

## 2015-02-07 DIAGNOSIS — K644 Residual hemorrhoidal skin tags: Secondary | ICD-10-CM

## 2015-02-07 DIAGNOSIS — M899 Disorder of bone, unspecified: Secondary | ICD-10-CM | POA: Diagnosis not present

## 2015-02-07 DIAGNOSIS — M8580 Other specified disorders of bone density and structure, unspecified site: Secondary | ICD-10-CM

## 2015-02-07 NOTE — Patient Instructions (Addendum)
Encouraged her to exercise as tolerated 30-45 minutes 4-5 times per week. Eat a well balanced diet. Avoid smoking. Limit alcohol intake. Wear seatbelt when riding in the car. Wear sun block (SPF >50) when spending extended times outside.  Continue current medications as ordered  Will call with lab results  follow up in 4 mos for routine visit and fasting labs

## 2015-02-07 NOTE — Progress Notes (Signed)
Patient ID: [name], female   DOB: Jul 28, 1924, 81 y.o.   MRN: [id number] Subjective:    [name] is a 81 y.o. female who presents for Medicare Initial preventive examination. Her daughters are present. Pt is a poor historian due to dementia. Hx obtained from daughters. She has no c/o  Preventive Screening-Counseling & Management  Tobacco History  Smoking status  . Former Smoker  Smokeless tobacco  . Never Used     Problems Prior to Visit 1. none  Current Problems (verified) Patient Active Problem List   Diagnosis Date Noted  . Abdominal pain 12/18/2014  . Nausea without vomiting 12/18/2014  . UTI (lower urinary tract infection) 12/18/2014  . Urinary tract infectious disease   . Chest pain 10/31/2014  . Nausea and vomiting 10/31/2014  . Nausea with vomiting   . Mild cognitive impairment with memory loss 07/25/2014  . Malnourished ([place]) 07/25/2014  . Recurrent UTI 08/07/2013  . History of CVA (cerebrovascular accident) 06/02/2013  . Unspecified constipation 03/16/2013  . Left-sided weakness 03/09/2013  . Possible Seizures 02/14/2013  . TIA (transient ischemic attack) 02/12/2013  . Hyperglycemia 02/12/2013  . High cholesterol   . Atrial fibrillation ([place]) 02/05/2013  . Chronic anticoagulation 02/05/2013  . HTN (hypertension) 11/14/2012  . Right ureteral stone 09/15/2010  . Atrial flutter ([place]) 07/17/2010  . OSTEOPENIA 06/03/2009  . MIGRAINE HEADACHE 02/05/2008  . CATARACTS, BILATERAL 02/05/2008  . DECREASED HEARING 02/05/2008  . GEN OSTEOARTHROSIS INVOLVING MULTIPLE SITES 02/05/2008  . COLONIC POLYPS, HX OF 02/05/2008    Medications Prior to Visit Current Outpatient Prescriptions on File Prior to Visit  Medication Sig Dispense Refill  . acetaminophen (TYLENOL) 500 MG tablet Take 1,000 mg by mouth as needed for mild pain or headache.     Marland Kitchen apixaban (ELIQUIS) 2.5 MG TABS tablet TAKE 1 TABLET (2.5 MG TOTAL) BY MOUTH 2 (TWO) TIMES DAILY. 180 tablet 0  . b  complex vitamins tablet Take 1 tablet by mouth 2 (two) times daily.     . Calcium Carb-Cholecalciferol (CALCIUM 600 + D) 600-200 MG-UNIT TABS Take 1 tablet by mouth 2 (two) times daily.    . Cholecalciferol (VITAMIN D) 2000 UNITS tablet Take 2,000 Units by mouth daily.    . diphenhydramine-acetaminophen (TYLENOL PM) 25-500 MG TABS Take 2 tablets by mouth at bedtime as needed (sleep).     . divalproex (DEPAKOTE) 125 MG DR tablet Take 2 tablets (250 mg total) by mouth 2 (two) times daily. 540 tablet 1  . Docusate Sodium (COLACE PO) Take 100 mg by mouth 2 (two) times daily.     . Melatonin 3 MG TABS Take 1 tablet by mouth at bedtime    . Multiple Vitamin (MULTIVITAMIN WITH MINERALS) TABS Take 1 tablet by mouth daily.    . ondansetron (ZOFRAN) 4 MG tablet Take 1 tablet (4 mg total) by mouth every 6 (six) hours as needed for nausea. 10 tablet 0  . oxybutynin (DITROPAN) 5 MG tablet Take 1 tablet (5 mg total) by mouth 2 (two) times daily. 60 tablet 2   No current facility-administered medications on file prior to visit.    Current Medications (verified) Current Outpatient Prescriptions  Medication Sig Dispense Refill  . acetaminophen (TYLENOL) 500 MG tablet Take 1,000 mg by mouth as needed for mild pain or headache.     Marland Kitchen apixaban (ELIQUIS) 2.5 MG TABS tablet TAKE 1 TABLET (2.5 MG TOTAL) BY MOUTH 2 (TWO) TIMES DAILY. 180 tablet 0  . b complex  vitamins tablet Take 1 tablet by mouth 2 (two) times daily.     . Calcium Carb-Cholecalciferol (CALCIUM 600 + D) 600-200 MG-UNIT TABS Take 1 tablet by mouth 2 (two) times daily.    . Cholecalciferol (VITAMIN D) 2000 UNITS tablet Take 2,000 Units by mouth daily.    . diphenhydramine-acetaminophen (TYLENOL PM) 25-500 MG TABS Take 2 tablets by mouth at bedtime as needed (sleep).     . divalproex (DEPAKOTE) 125 MG DR tablet Take 2 tablets (250 mg total) by mouth 2 (two) times daily. 540 tablet 1  . Docusate Sodium (COLACE PO) Take 100 mg by mouth 2 (two) times  daily.     . hydrocortisone (ANUSOL-HC) 2.5 % rectal cream Place 1 application rectally as needed for hemorrhoids or itching.    . hydrocortisone (ANUSOL-HC) 25 MG suppository Place 25 mg rectally as needed for hemorrhoids or itching.    . Melatonin 3 MG TABS Take 1 tablet by mouth at bedtime    . Multiple Vitamin (MULTIVITAMIN WITH MINERALS) TABS Take 1 tablet by mouth daily.    . ondansetron (ZOFRAN) 4 MG tablet Take 1 tablet (4 mg total) by mouth every 6 (six) hours as needed for nausea. 10 tablet 0  . oxybutynin (DITROPAN) 5 MG tablet Take 1 tablet (5 mg total) by mouth 2 (two) times daily. 60 tablet 2   No current facility-administered medications for this visit.     Allergies (verified) Aricept; Tramadol hcl; and Alendronate sodium   PAST HISTORY  Family History Family History  Problem Relation Age of Onset  . Colon cancer Father   . Breast cancer    . Brain cancer    . Cancer Mother     breast  . Aneurysm Mother     brain  . Heart attack Neg Hx   . Diabetes Neg Hx   . Hypertension Neg Hx   . Stroke Mother     Social History Social History  Substance Use Topics  . Smoking status: Former Research scientist (life sciences)  . Smokeless tobacco: Never Used  . Alcohol Use: No     Comment: 08/01/2012 "glass of wine on special occasions"    Past Surgical History  Procedure Laterality Date  . Cataract extraction w/ intraocular lens  implant, bilateral  2006-2008  . Ganglion cyst excision Bilateral 1938,1954,2003,2005    "wrists/hand" (08/01/2012)  . Cardioversion  05/19/2010    Dr. Einar Gip  . Tonsillectomy  ~ 1935  . Appendectomy  02/19/53    `  . Mouth surgery      [name]    Are there smokers in your home (other than you)? No she is a former smoker  Risk Factors Current exercise habits: Exercise is limited by deconditioning.  Dietary issues discussed: making healthy food choices  Cardiac risk factors: advanced age (older than 86 for men, 27 for women), dyslipidemia, hypertension and sedentary  lifestyle.  Depression Screen (Note: if answer to either of the following is "Yes", a more complete depression screening is indicated)   Over the past 2 weeks, have you felt down, depressed or hopeless? No  Over the past 2 weeks, have you felt little interest or pleasure in doing things? no  Have you lost interest or pleasure in daily life? No  Do you often feel hopeless? No  Do you cry easily over simple problems? No  Activities of Daily Living In your present state of health, do you have any difficulty performing the following activities?:  Driving? Yes Managing money?  no Feeding yourself? No Getting from bed to chair? Yes  Climbing a flight of stairs? Yes Preparing food and eating?: yes Bathing or showering? YES Getting dressed: YES Getting to the toilet? YES Using the toilet No Moving around from place to place: YES In the past year have you fallen or had a near fall?:Yes 2 times   Are you sexually active?  No  Do you have more than one partner?  NA  Hearing Difficulties:  Do you often ask people to speak up or repeat themselves?YES - wears hearing aids b/l Do you experience ringing or noises in your ears? YES Do you have difficulty understanding soft or whispered voices? YES   Do you feel that you have a problem with memory? Yes  Do you often misplace items? Yes  Do you feel safe at home?  Yes  Cognitive Testing  Advanced Directives have been discussed with the patient? Yes she has a living will and [facility]  List the Names of Other Physician/Practitioners you currently use: 1.  Dr Leonie Man (Neurology) 2. Dr Einar Gip  (Cardio)  Indicate any recent Medical Services you may have received from other than [facility] providers in the past year (date may be approximate).  Immunization History  Administered Date(s) Administered  . Influenza Split 01/18/2011  . Influenza Whole 02/10/2009, 12/09/2009  . Influenza, High Dose Seasonal PF 01/26/2013  . Influenza,inj,Quad PF,36+ Mos  11/27/2013, 12/11/2014  . PPD Test 09/20/2014  . Pneumococcal Conjugate-13 10/19/2013  . Pneumococcal Polysaccharide-23 05/06/2009  . Td 02/05/2008  . Zoster 01/05/2014    Screening Tests Health Maintenance  Topic Date Due  . INFLUENZA VACCINE  10/28/2015  . TETANUS/TDAP  02/04/2018  . DEXA SCAN  Completed  . ZOSTAVAX  Completed  . PNA vac Low Risk Adult  Completed    All answers were reviewed with the patient and necessary referrals were made:  Gildardo Cranker, DO   02/07/2015   History reviewed: allergies, current medications, past family history, past medical history, past social history, past surgical history and problem list  Review of Systems   Review of Systems  Unable to perform ROS: dementia      Objective:     Vision by Snellen chart: corrected OU 20/40  Body mass index is 20.9 kg/(m^2). BP 128/76 mmHg  Pulse 86  Temp(Src) 97.8 F (36.6 C) (Oral)  Resp 20  Ht 5\' 6"  (1.676 m)  Wt 129 lb 6.4 oz (58.695 kg)  BMI 20.90 kg/m2  SpO2 95%  Physical Exam  Constitutional: No distress.  Frail appearing in NAD. Daughters present  HENT:  Head: Normocephalic and atraumatic.  Right Ear: External ear normal.  Left Ear: External ear normal.  Mouth/Throat: Oropharynx is clear and moist. No oropharyngeal exudate.  TMs intact b/l, nonbulging, no redness  Eyes: Conjunctivae and EOM are normal. Pupils are equal, round, and reactive to light. No scleral icterus.  Neck: Normal range of motion. Neck supple. Carotid bruit is not present. No tracheal deviation present. No thyromegaly present.  Cardiovascular: Normal rate and intact distal pulses.  An irregularly irregular rhythm present. Exam reveals no gallop and no friction rub.   Murmur heard.  Systolic murmur is present with a grade of 1/6  Pulmonary/Chest: Effort normal and breath sounds normal. She has no wheezes. She has no rhonchi. She has no rales. She exhibits no tenderness. Right breast exhibits no inverted  nipple, no mass, no nipple discharge, no skin change and no tenderness. Left breast exhibits no inverted nipple,  no mass, no nipple discharge, no skin change and no tenderness. Breasts are symmetrical.  Abdominal: Soft. Bowel sounds are normal. She exhibits no distension and no mass. There is no hepatosplenomegaly. There is no tenderness. There is no rebound and no guarding.  Genitourinary: Rectal exam shows external hemorrhoid.  Musculoskeletal: She exhibits edema and tenderness.  Lymphadenopathy:    She has no cervical adenopathy.  Neurological: She is alert. She has normal reflexes. She displays abnormal stance. No cranial nerve deficit. Coordination and gait abnormal.  Skin: Skin is warm and dry. No rash noted.  Psychiatric: Mood and affect normal.        Assessment:       ICD-9-CM ICD-10-CM   1. Well adult exam V70.0 Z00.00   2. Mild cognitive impairment with memory loss 331.83 G31.84    780.93    3. Constipation, unspecified constipation type - stable 564.00 K59.00   4. Chronic atrial fibrillation (HCC) - rate controlled 427.31 I48.2   5. Essential hypertension - stable 401.9 I10   6. Hyperlipidemia - stable 272.4 E78.5   7. Osteopenia of the elderly - stable 733.90 M89.9 TSH     Vitamin D, 25-hydroxy  8. Urge incontinence of urine - uncontrolled 788.31 N39.41   9.      External hemorrhoids, uncomplicated      Plan:     During the course of the visit the patient was educated and counseled about appropriate screening and preventive services including:    Pneumococcal vaccine - UTD  Influenza vaccine - UTD  Screening mammography -  Aged out  Bone densitometry screening - UTD  Colorectal cancer screening - aged out  Diet review for nutrition referral? Yes ____  Not Indicated _x___   Patient Instructions (the written plan) was given to the patient.  Medicare Attestation I have personally reviewed: The patient's medical and social history Their use of alcohol,  tobacco or illicit drugs Their current medications and supplements The patient's functional ability including ADLs,fall risks, home safety risks, cognitive, and hearing and visual impairment Diet and physical activities Evidence for depression or mood disorders  The patient's weight, height, BMI, and visual acuity have been recorded in the chart.  I have made referrals, counseling, and provided education to the patient based on review of the above and I have provided the patient with a written personalized care plan for preventive services.     Gildardo Cranker, DO   02/07/2015    Pt is UTD on health maintenance. Vaccinations are UTD. Pt maintains a healthy lifestyle. Encouraged pt to exercise 30-45 minutes 4-5 times per week. Eat a well balanced diet. Avoid smoking. Limit alcohol intake. Wear seatbelt when riding in the car. Wear sun block (SPF >50) when spending extended times outside.  Continue current medications as ordered  Follow up in 4 mos for routine visit and fasting labs    [place]. Perlie Gold  [facility] [address] [place], [place] [address] [phone] Cell (Monday-Friday 8 AM - 5 PM) [phone] After 5 PM and follow prompts

## 2015-02-08 LAB — TSH: TSH: 0.385 u[IU]/mL — AB (ref 0.450–4.500)

## 2015-02-08 LAB — VITAMIN D 25 HYDROXY (VIT D DEFICIENCY, FRACTURES): Vit D, 25-Hydroxy: 85.5 ng/mL (ref 30.0–100.0)

## 2015-02-09 DIAGNOSIS — K644 Residual hemorrhoidal skin tags: Secondary | ICD-10-CM | POA: Insufficient documentation

## 2015-02-24 ENCOUNTER — Encounter: Payer: Self-pay | Admitting: Physical Therapy

## 2015-02-24 ENCOUNTER — Ambulatory Visit: Payer: Medicare Other | Attending: Neurology | Admitting: Physical Therapy

## 2015-02-24 DIAGNOSIS — M623 Immobility syndrome (paraplegic): Secondary | ICD-10-CM | POA: Diagnosis not present

## 2015-02-24 DIAGNOSIS — R531 Weakness: Secondary | ICD-10-CM

## 2015-02-24 DIAGNOSIS — R6889 Other general symptoms and signs: Secondary | ICD-10-CM | POA: Insufficient documentation

## 2015-02-24 DIAGNOSIS — Z7409 Other reduced mobility: Secondary | ICD-10-CM | POA: Insufficient documentation

## 2015-02-24 DIAGNOSIS — M546 Pain in thoracic spine: Secondary | ICD-10-CM | POA: Insufficient documentation

## 2015-02-24 DIAGNOSIS — M2560 Stiffness of unspecified joint, not elsewhere classified: Secondary | ICD-10-CM

## 2015-02-24 DIAGNOSIS — R269 Unspecified abnormalities of gait and mobility: Secondary | ICD-10-CM | POA: Diagnosis not present

## 2015-02-25 NOTE — Therapy (Signed)
[facility] [address] [name], [place], [address] Phone: [phone]   Fax:  [phone]  Physical Therapy Evaluation  Patient Details  Name: [name] MRN: [id number] Date of Birth: 1924/07/12 Referring Provider: Antony Contras, MD  Encounter Date: 02/24/2015      PT End of Session - 02/24/15 1100    Visit Number 1   Number of Visits 18   Date for PT Re-Evaluation 04/25/15   Authorization Type Medicare G-Code & Progress report   PT Start Time 1020   PT Stop Time 1056   PT Time Calculation (min) 36 min   Equipment Utilized During Treatment Gait belt   Activity Tolerance Patient limited by pain   Behavior During Therapy [facility] for tasks assessed/performed      Past Medical History  Diagnosis Date  . Arthritis   . Hypertension   . Migraine   . Colon polyp   . Hearing difficulty   . Torus palatinus   . Atrial flutter ([place]) January, 2012  . Stroke ([place]) 08/02/12     right lenticular nucleus infarct  . Left leg weakness 02/05/2013  . Chronic anticoagulation   . High cholesterol   . Atrial fibrillation ([place])     on Eliquis Rx  . Mild cognitive impairment with memory loss   . Malnutrition ([place])   . TIA (transient ischemic attack)   . Constipation   . Osteoporosis   . Hemorrhoids     Past Surgical History  Procedure Laterality Date  . Cataract extraction w/ intraocular lens  implant, bilateral  2006-2008  . Ganglion cyst excision Bilateral 1938,1954,2003,2005    "wrists/hand" (08/01/2012)  . Cardioversion  05/19/2010    Dr. Einar Gip  . Tonsillectomy  ~ 1935  . Appendectomy  02/19/53    `  . Mouth surgery      [name]    There were no vitals filed for this visit.  Visit Diagnosis:  Abnormality of gait  Weakness generalized  Stiffness due to immobility  Decreased functional activity tolerance  Midline thoracic back pain  Decreased transfer ability      Subjective Assessment - 02/24/15 1033    Subjective This 81yo female has history of CVAs and mental decline over last year. She was hospitalization 12/18/2014 with UTI & dehydration with physical decline. She went to SNF for 1 month and family reports deconditioning due to liimited activiites. She has been home for 1 month. She presents with 2 of 3 daughters for PT evaluation.    Patient is accompained by: Family member   Patient Stated Goals Family would like to build her stamina and improve transfers.    Currently in Pain? Yes   Pain Score 9    Pain Location Back   Pain Orientation Lower   Pain Descriptors / Indicators Aching   Pain Type Chronic pain   Pain Onset More than a month ago   Aggravating Factors  sitting too long, standing too long   Pain Relieving Factors laying down, lazy boy chair   Multiple Pain Sites No            [facility] PT Assessment - 02/24/15 1015    Assessment   Medical Diagnosis Gait Abnormality    Referring Provider Antony Contras, MD   Precautions   Precautions Fall  osteoporosis   Restrictions   Weight Bearing Restrictions No   Balance Screen   Has the patient fallen in the past 6 months Yes   How many  times? 3   Has the patient had a decrease in activity level because of a fear of falling?  Yes   Is the patient reluctant to leave their home because of a fear of falling?  Yes   [place] residence   [place]  3 daughters   Type of [place] to enter   Entrance Stairs-Number of Steps 2 steps (3-4" tall)   Entrance Stairs-Rails None   Home Layout Two level  stair lift from main level to 2nd floor, no access to baseme   Prior Function   Level of Independence Needs assistance with ADLs;Needs assistance with gait   Observation/Other Assessments   Focus on Therapeutic Outcomes (FOTO)  40 Functional Status  45 Risk Adjusted   Activities of Balance Confidence Scale (ABC Scale)  6.3%   Fear Avoidance Belief  Questionnaire (FABQ)  72 (19)   Posture/Postural Control   Posture/Postural Control Postural limitations   Postural Limitations Rounded Shoulders;Forward head;Increased thoracic kyphosis;Posterior pelvic tilt   ROM / Strength   AROM / PROM / Strength PROM;Strength   PROM   Overall PROM  Within functional limits for tasks performed   Strength   Overall Strength Deficits   Strength Assessment Site Hip;Knee;Ankle   Right/Left Hip Right;Left   Right Hip Flexion 3-/5   Right Hip Extension 2/5   Right Hip ABduction 2/5   Right Hip ADduction 2/5   Left Hip Flexion 3-/5   Left Hip Extension 2-/5   Left Hip ABduction 2-/5   Left Hip ADduction 2/5   Right/Left Knee Right;Left   Right Knee Flexion 3-/5   Right Knee Extension 3-/5   Left Knee Flexion 3-/5   Left Knee Extension 3-/5   Right/Left Ankle Right;Left   Right Ankle Dorsiflexion 3/5   Left Ankle Dorsiflexion 3/5   Transfers   Transfers Sit to Stand;Stand to Sit;Squat Pivot Transfers   Sit to Stand 4: Min assist;With upper extremity assist;With armrests;From chair/3-in-1  to RW for support; increased time   Stand to Sit 4: Min assist;With upper extremity assist;With armrests;To chair/3-in-1  from RW; uncontrolled descent osteoporosis concern   Squat Pivot Transfers 4: Min assist;With upper extremity assistance;With armrests  over armrests for transport chair.   Ambulation/Gait   Ambulation/Gait Yes   Ambulation/Gait Assistance 4: Min assist   Ambulation Distance (Feet) 45 Feet   Assistive device Rolling walker   Gait Pattern Step-to pattern;Decreased step length - left;Decreased stance time - right;Decreased stride length;Decreased hip/knee flexion - left;Decreased hip/knee flexion - right;Shuffle;Right flexed knee in stance;Left flexed knee in stance;Lateral hip instability;Trunk flexed   Ambulation Surface Indoor;Level   Balance   Balance Assessed Yes   Static Standing Balance   Static Standing - Balance Support Bilateral  upper extremity supported  RW support   Static Standing - Level of Assistance 4: Min assist   Static Standing - Comment/# of Minutes 2 minutes   Dynamic Standing Balance   Dynamic Standing - Balance Support Left upper extremity supported;During functional activity  RW support, managing pants at waist only   Dynamic Standing - Level of Assistance 4: Min assist                             PT Short Term Goals - 02/24/15 1100    PT SHORT TERM GOAL #1   Title Patient and daughters demonstrate understanding of initial HEP. (  Target Date: 03/27/2015)   Time 1   Period Months   Status New   PT SHORT TERM GOAL #2   Title Sit to / from stand chairs with armrests to RW with supervision.  (Target Date: 03/27/2015)   Time 1   Period Months   Status New   PT SHORT TERM GOAL #3   Title Patient ambulates 37' with RW with minimal guard.  (Target Date: 03/27/2015)   Time 1   Period Months   Status New           PT Long Term Goals - 03/23/15 1100    PT LONG TERM GOAL #1   Title Patient and daughters demonstrate / verbalize understanding of ongoing HEP.  (Target Date: 04/25/2015)   Time 2   Period Months   Status New   PT LONG TERM GOAL #2   Title Patient transfers w/c to mat squat pivot with supervision.  (Target Date: 04/25/2015)   Time 2   Period Months   Status New   PT LONG TERM GOAL #3   Title Patient demonstrates standing balance with RW managing pants for toileting with supervision.  (Target Date: 04/25/2015)   Time 2   Period Months   Status New   PT LONG TERM GOAL #4   Title Patient ambulates 57' with RW with daughters assist safely.  (Target Date: 04/25/2015)   Time 2   Period Months   Status New               Plan - 03/23/15 1100    Clinical Impression Statement This 81yo female has history of CVA & other medical conditions associated with deconditioning. She was hospitalized with UTI & dehydration on 12/18/2014. She went to SNF rehab for 1  month. Daughters report she only left room for therapy sessions. She became deconditioned as frequency of activities were decreased. She has been home with her daughters for 1 month and they report she needs PT to build strength & endurance to prior to most recent hospitalization. She has history of thoracic compression fracture with osteoporosis in April 2016. Dependency & uncontrolled transfers including stand to sit increases risk of more compression fractures. She has declined with gait increasing burden of care & safety issues with daughter's at her home. Patient would benefit from  skilled PT to return function to level prior to recent hospitalization.    Pt will benefit from skilled therapeutic intervention in order to improve on the following deficits Abnormal gait;Decreased activity tolerance;Decreased balance;Decreased endurance;Decreased mobility;Decreased safety awareness;Decreased strength;Postural dysfunction;Pain   Rehab Potential Good   PT Frequency 2x / week   PT Duration Other (comment)  9 weeks (60 days)   PT Treatment/Interventions ADLs/Self Care Home Management;DME Instruction;Gait training;Stair training;Functional mobility training;Therapeutic activities;Therapeutic exercise;Balance training;Neuromuscular re-education;Patient/family education   PT Next Visit Plan HEP supine & sidelying for strength; sitting posture HEP; transfers & gait training   Consulted and Agree with Plan of Care Patient;Family member/caregiver          G-Codes - 2015/03/23 1145    Functional Assessment Tool Used Squat-pivot transfer w/c to mat with minA, constant verbal cues and increased time.    Functional Limitation Changing and maintaining body position   Changing and Maintaining Body Position Current Status [phone]) At least 60 percent but less than 80 percent impaired, limited or restricted   Changing and Maintaining Body Position Goal Status [id number]) At least 40 percent but less than 60 percent  impaired, limited or restricted  Problem List Patient Active Problem List   Diagnosis Date Noted  . External hemorrhoids without complication [id number]  . Abdominal pain 12/18/2014  . Nausea without vomiting 12/18/2014  . UTI (lower urinary tract infection) 12/18/2014  . Urinary tract infectious disease   . Chest pain 10/31/2014  . Nausea and vomiting 10/31/2014  . Nausea with vomiting   . Mild cognitive impairment with memory loss 07/25/2014  . Malnourished ([place]) 07/25/2014  . Recurrent UTI 08/07/2013  . History of CVA (cerebrovascular accident) 06/02/2013  . Unspecified constipation 03/16/2013  . Left-sided weakness 03/09/2013  . Possible Seizures 02/14/2013  . TIA (transient ischemic attack) 02/12/2013  . Hyperglycemia 02/12/2013  . High cholesterol   . Atrial fibrillation ([place]) 02/05/2013  . Chronic anticoagulation 02/05/2013  . HTN (hypertension) 11/14/2012  . Right ureteral stone 09/15/2010  . Atrial flutter ([place]) 07/17/2010  . Osteopenia of the elderly 06/03/2009  . MIGRAINE HEADACHE 02/05/2008  . CATARACTS, BILATERAL 02/05/2008  . DECREASED HEARING 02/05/2008  . GEN OSTEOARTHROSIS INVOLVING MULTIPLE SITES 02/05/2008  . COLONIC POLYPS, HX OF 02/05/2008    Jamey Reas PT, DPT 02/25/2015, 12:10 PM  [facility] [address] [place], [place], [address] Phone: [phone]   Fax:  [phone]  Name: CORENE SMYLY MRN: [id number] Date of Birth: 09-22-1924

## 2015-02-26 ENCOUNTER — Encounter: Payer: Self-pay | Admitting: Physical Therapy

## 2015-02-26 ENCOUNTER — Ambulatory Visit: Payer: Medicare Other | Admitting: Physical Therapy

## 2015-02-26 DIAGNOSIS — R6889 Other general symptoms and signs: Secondary | ICD-10-CM

## 2015-02-26 DIAGNOSIS — R531 Weakness: Secondary | ICD-10-CM | POA: Diagnosis not present

## 2015-02-26 DIAGNOSIS — Z7409 Other reduced mobility: Secondary | ICD-10-CM | POA: Diagnosis not present

## 2015-02-26 DIAGNOSIS — M2560 Stiffness of unspecified joint, not elsewhere classified: Secondary | ICD-10-CM

## 2015-02-26 DIAGNOSIS — M623 Immobility syndrome (paraplegic): Secondary | ICD-10-CM | POA: Diagnosis not present

## 2015-02-26 DIAGNOSIS — R269 Unspecified abnormalities of gait and mobility: Secondary | ICD-10-CM | POA: Diagnosis not present

## 2015-02-26 DIAGNOSIS — M546 Pain in thoracic spine: Secondary | ICD-10-CM

## 2015-02-26 NOTE — Therapy (Signed)
[facility] [address] [place], [place], [address] Phone: [phone]   Fax:  [phone]  Physical Therapy Treatment  Patient Details  Name: [name] MRN: [id number] Date of Birth: 1924-04-26 Referring Provider: Antony Contras, MD  Encounter Date: 02/26/2015      PT End of Session - 02/26/15 1100    Visit Number 2   Number of Visits 18   Date for PT Re-Evaluation 04/25/15   Authorization Type Medicare G-Code & Progress report   PT Start Time 1018   PT Stop Time 1100   PT Time Calculation (min) 42 min   Equipment Utilized During Treatment Gait belt   Activity Tolerance Patient limited by pain   Behavior During Therapy [facility] for tasks assessed/performed      Past Medical History  Diagnosis Date  . Arthritis   . Hypertension   . Migraine   . Colon polyp   . Hearing difficulty   . Torus palatinus   . Atrial flutter ([place]) January, 2012  . Stroke ([place]) 08/02/12     right lenticular nucleus infarct  . Left leg weakness 02/05/2013  . Chronic anticoagulation   . High cholesterol   . Atrial fibrillation ([place])     on Eliquis Rx  . Mild cognitive impairment with memory loss   . Malnutrition ([place])   . TIA (transient ischemic attack)   . Constipation   . Osteoporosis   . Hemorrhoids     Past Surgical History  Procedure Laterality Date  . Cataract extraction w/ intraocular lens  implant, bilateral  2006-2008  . Ganglion cyst excision Bilateral 1938,1954,2003,2005    "wrists/hand" (08/01/2012)  . Cardioversion  05/19/2010    Dr. Einar Gip  . Tonsillectomy  ~ 1935  . Appendectomy  02/19/53    `  . Mouth surgery      [name]    There were no vitals filed for this visit.  Visit Diagnosis:  Weakness generalized  Stiffness due to immobility  Decreased functional activity tolerance  Midline thoracic back pain  Decreased transfer ability      Subjective Assessment - 02/26/15 1015    Subjective No issues  since evaluation.    Currently in Pain? Yes   Pain Score 5    Pain Location Back   Pain Orientation Upper   Pain Descriptors / Indicators Aching   Pain Type Chronic pain   Pain Onset More than a month ago   Pain Frequency Constant   Multiple Pain Sites No     Therapeutic Exercise: See pt instructions & pt education sections.  Therapeutic Activities: PT instructed in bra wear & design to facilitate thoracic spine support. Possibility of Chair Back TLSO orthosis to support back.                             PT Education - 02/26/15 1015    Education provided Yes   Education Details HEP supine & sitting in w/c; see pt instruction handout.  Increasing activity level with short walks to bathroom & kitchen with RW with family and longer walks to her "best" 2-4 times per day.  Discussed bra wear to support / decrease stress on thoracic spine   Person(s) Educated Patient;Child(ren)   Methods Explanation;Demonstration;Tactile cues;Verbal cues;Handout   Comprehension Verbalized understanding;Returned demonstration;Verbal cues required;Tactile cues required          PT Short Term Goals - 02/24/15 1100    PT  SHORT TERM GOAL #1   Title Patient and daughters demonstrate understanding of initial HEP. (Target Date: 03/27/2015)   Time 1   Period Months   Status New   PT SHORT TERM GOAL #2   Title Sit to / from stand chairs with armrests to RW with supervision.  (Target Date: 03/27/2015)   Time 1   Period Months   Status New   PT SHORT TERM GOAL #3   Title Patient ambulates 92' with RW with minimal guard.  (Target Date: 03/27/2015)   Time 1   Period Months   Status New           PT Long Term Goals - 02/24/15 1100    PT LONG TERM GOAL #1   Title Patient and daughters demonstrate / verbalize understanding of ongoing HEP.  (Target Date: 04/25/2015)   Time 2   Period Months   Status New   PT LONG TERM GOAL #2   Title Patient transfers w/c to mat squat pivot with  supervision.  (Target Date: 04/25/2015)   Time 2   Period Months   Status New   PT LONG TERM GOAL #3   Title Patient demonstrates standing balance with RW managing pants for toileting with supervision.  (Target Date: 04/25/2015)   Time 2   Period Months   Status New   PT LONG TERM GOAL #4   Title Patient ambulates 50' with RW with daughters assist safely.  (Target Date: 04/25/2015)   Time 2   Period Months   Status New               Plan - 02/26/15 1100    Clinical Impression Statement Patient and 2 daughters appear to understand HEP. They appear to understand increasing activity level with short walks with increased frequency in home.    Pt will benefit from skilled therapeutic intervention in order to improve on the following deficits Abnormal gait;Decreased activity tolerance;Decreased balance;Decreased endurance;Decreased mobility;Decreased safety awareness;Decreased strength;Postural dysfunction;Pain   Rehab Potential Good   PT Frequency 2x / week   PT Duration Other (comment)  9 weeks (60 days)   PT Treatment/Interventions ADLs/Self Care Home Management;DME Instruction;Gait training;Stair training;Functional mobility training;Therapeutic activities;Therapeutic exercise;Balance training;Neuromuscular re-education;Patient/family education   PT Next Visit Plan transfers & gait training: NuStep, Balance activities   Consulted and Agree with Plan of Care Patient;Family member/caregiver   Family Member Consulted 2 daughters        Problem List Patient Active Problem List   Diagnosis Date Noted  . External hemorrhoids without complication [id number]  . Abdominal pain 12/18/2014  . Nausea without vomiting 12/18/2014  . UTI (lower urinary tract infection) 12/18/2014  . Urinary tract infectious disease   . Chest pain 10/31/2014  . Nausea and vomiting 10/31/2014  . Nausea with vomiting   . Mild cognitive impairment with memory loss 07/25/2014  . Malnourished ([place])  07/25/2014  . Recurrent UTI 08/07/2013  . History of CVA (cerebrovascular accident) 06/02/2013  . Unspecified constipation 03/16/2013  . Left-sided weakness 03/09/2013  . Possible Seizures 02/14/2013  . TIA (transient ischemic attack) 02/12/2013  . Hyperglycemia 02/12/2013  . High cholesterol   . Atrial fibrillation ([place]) 02/05/2013  . Chronic anticoagulation 02/05/2013  . HTN (hypertension) 11/14/2012  . Right ureteral stone 09/15/2010  . Atrial flutter ([place]) 07/17/2010  . Osteopenia of the elderly 06/03/2009  . MIGRAINE HEADACHE 02/05/2008  . CATARACTS, BILATERAL 02/05/2008  . DECREASED HEARING 02/05/2008  . GEN OSTEOARTHROSIS INVOLVING MULTIPLE SITES 02/05/2008  .  COLONIC POLYPS, HX OF 02/05/2008    Jamey Reas PT, DPT 02/26/2015, 8:15 PM  [facility] [address] [place], [place], [address] Phone: [phone]   Fax:  [phone]  Name: HARRY SCHERER MRN: [id number] Date of Birth: 1924/12/04

## 2015-02-26 NOTE — Patient Instructions (Signed)
HIP / KNEE: Flexion, Heel Slides - Supine    Slide heel up toward buttocks, keeping leg in straight line. Alternate legs. _10__ reps per leg, __1-2_ sets per day, _7_ _days per week   Copyright  VHI. All rights reserved.  HIP / KNEE: Flexion, Knee to Chest - Supine    Marching: Raise knee to chest. Keep leg in a straight line. Alternate legs.  Perform slowly. _10__ reps per legs, __1-2_ sets per day, _7__ days per week  Copyright  VHI. All rights reserved.  Bridging    Slowly raise buttocks from floor, keeping stomach tight. Repeat ___10_ times per set. Do __1-2__ sets per day Do __7__ days/wk.  http://orth.exer.us/1097   Copyright  VHI. All rights reserved.  ABDUCTION: Supine (Active)    Lie on back, legs straight. Draw right leg out to the side as far as possible. Complet _10__ repetitions.   Copyright  VHI. All rights reserved.  Shoulder (Scapula) Retraction    Lie on back. Push elbows & back of head into bed. Pull shoulders back, squeezing shoulder blades together lifting upper back off bed.  Repeat __10__ times per session. Do __7_ sessions per week. Position: Standing   Copyright  VHI. All rights reserved.  EXTENSION: Sitting (Active)    Sit with feet flat. Straighten right knee. Alternate legs Complete __10_ repetitions.   http://gtsc.exer.us/269   Copyright  VHI. All rights reserved.  Chair Sitting    Sit at edge of seat, spine straight, one leg extended. Put a hand on each thigh and bend forward from the hip, keeping spine straight. Allow hand on extended leg to reach toward toes. Support upper body with other arm. Hold _10-15__ seconds. Repeat __3_ times per leg per session. Do _1-3_ sessions per day.  Copyright  VHI. All rights reserved.  Sit to Stand: Head Upright    Stand up & sit down. With head upright, stand up slowly with eyes open. Repeat __5-10__ times per session. Do _1-3___ sessions per day.  Copyright  VHI. All rights  reserved.

## 2015-02-28 ENCOUNTER — Encounter: Payer: Self-pay | Admitting: Internal Medicine

## 2015-02-28 DIAGNOSIS — E0590 Thyrotoxicosis, unspecified without thyrotoxic crisis or storm: Secondary | ICD-10-CM

## 2015-03-03 ENCOUNTER — Encounter: Payer: Self-pay | Admitting: Physical Therapy

## 2015-03-03 ENCOUNTER — Ambulatory Visit: Payer: Medicare Other | Attending: Neurology | Admitting: Physical Therapy

## 2015-03-03 DIAGNOSIS — R6889 Other general symptoms and signs: Secondary | ICD-10-CM | POA: Diagnosis not present

## 2015-03-03 DIAGNOSIS — Z7409 Other reduced mobility: Secondary | ICD-10-CM | POA: Diagnosis not present

## 2015-03-03 DIAGNOSIS — M623 Immobility syndrome (paraplegic): Secondary | ICD-10-CM | POA: Insufficient documentation

## 2015-03-03 DIAGNOSIS — R269 Unspecified abnormalities of gait and mobility: Secondary | ICD-10-CM | POA: Insufficient documentation

## 2015-03-03 DIAGNOSIS — M546 Pain in thoracic spine: Secondary | ICD-10-CM | POA: Diagnosis not present

## 2015-03-03 DIAGNOSIS — R531 Weakness: Secondary | ICD-10-CM | POA: Insufficient documentation

## 2015-03-03 DIAGNOSIS — M2560 Stiffness of unspecified joint, not elsewhere classified: Secondary | ICD-10-CM

## 2015-03-03 NOTE — Therapy (Signed)
[facility] [address] [place], [place], [address] Phone: [phone]   Fax:  [phone]  Physical Therapy Treatment  Patient Details  Name: [name] MRN: [id number] Date of Birth: April 24, 1924 Referring Provider: Antony Contras, MD  Encounter Date: 03/03/2015      PT End of Session - 03/03/15 1207    Visit Number 3   Number of Visits 18   Date for PT Re-Evaluation 04/25/15   Authorization Type Medicare G-Code & Progress report   PT Start Time 1107   PT Stop Time 1145   PT Time Calculation (min) 38 min   Equipment Utilized During Treatment Gait belt   Activity Tolerance Patient limited by pain;Patient tolerated treatment well   Behavior During Therapy [facility] for tasks assessed/performed      Past Medical History  Diagnosis Date  . Arthritis   . Hypertension   . Migraine   . Colon polyp   . Hearing difficulty   . Torus palatinus   . Atrial flutter ([place]) January, 2012  . Stroke ([place]) 08/02/12     right lenticular nucleus infarct  . Left leg weakness 02/05/2013  . Chronic anticoagulation   . High cholesterol   . Atrial fibrillation ([place])     on Eliquis Rx  . Mild cognitive impairment with memory loss   . Malnutrition ([place])   . TIA (transient ischemic attack)   . Constipation   . Osteoporosis   . Hemorrhoids     Past Surgical History  Procedure Laterality Date  . Cataract extraction w/ intraocular lens  implant, bilateral  2006-2008  . Ganglion cyst excision Bilateral 1938,1954,2003,2005    "wrists/hand" (08/01/2012)  . Cardioversion  05/19/2010    Dr. Einar Gip  . Tonsillectomy  ~ 1935  . Appendectomy  02/19/53    `  . Mouth surgery      [name]    There were no vitals filed for this visit.  Visit Diagnosis:  Weakness generalized  Stiffness due to immobility  Decreased functional activity tolerance  Midline thoracic back pain  Decreased transfer ability  Abnormality of gait      Subjective  Assessment - 03/03/15 1152    Subjective No pain, falls, or other complaints reported.   Currently in Pain? No/denies   Multiple Pain Sites No     Therex and HEP review (to increase pt. and family understanding of home exercise program and LE activity tolerance): Verbal cues provided throughout for proper technique and pacing.   - supine bridging x 10 reps; verbal cues provided for pacing. - hip abduction / adduction  X 10 reps each leg; verbal cues provided for pacing. - heel slides x 10 reps each leg; verbal cues provided for pacing. - sitting long arc quads x 10 reps each leg; verbal cues provided for pacing.          [place] Adult PT Treatment/Exercise - 03/03/15 1154    Transfers   Transfers Sit to Stand;Stand to Sit;Squat Pivot Transfers   Sit to Stand 4: Min assist;With upper extremity assist;With armrests;From chair/3-in-1   Sit to Stand Details (indicate cue type and reason) Pt. required verbal cueing for scooting forward in w/c, and for proper hand placement.     Stand to Sit 4: Min assist;With upper extremity assist;With armrests;To chair/3-in-1   Stand to Sit Details (indicate cue type and reason) Tactile cues for sequencing;Visual cues/gestures for precautions/safety;Verbal cues for sequencing;Verbal cues for technique   Stand to Sit Details pt.  required verbal cues for hand placement and check in for chair placement.    Squat Pivot Transfers 4: Min assist;With upper extremity assistance;With armrests   Comments Pt. demo's good technique with verbal cues for proper hand placement and sequence.     Ambulation/Gait   Ambulation/Gait Yes   Ambulation/Gait Assistance 5: Supervision   Ambulation/Gait Assistance Details Pt. required frequent verbal cues to keep RW close to base of support and for upright posture; PTA tied theraband on RW for visual target for LE advancement; TB on RW greately improved pt. spacing from RW and LE advancement.    Ambulation Distance (Feet) 45 Feet   13ft, 50ft, 50ft, 88ft with RW   Assistive device Rolling walker   Gait Pattern Step-to pattern;Decreased step length - left;Decreased stance time - right;Decreased stride length;Decreased hip/knee flexion - left;Decreased hip/knee flexion - right;Shuffle;Right flexed knee in stance;Left flexed knee in stance;Lateral hip instability;Trunk flexed   Ambulation Surface Level;Indoor            PT Short Term Goals - 02/24/15 1100    PT SHORT TERM GOAL #1   Title Patient and daughters demonstrate understanding of initial HEP. (Target Date: 03/27/2015)   Time 1   Period Months   Status New   PT SHORT TERM GOAL #2   Title Sit to / from stand chairs with armrests to RW with supervision.  (Target Date: 03/27/2015)   Time 1   Period Months   Status New   PT SHORT TERM GOAL #3   Title Patient ambulates 31' with RW with minimal guard.  (Target Date: 03/27/2015)   Time 1   Period Months   Status New           PT Long Term Goals - 02/24/15 1100    PT LONG TERM GOAL #1   Title Patient and daughters demonstrate / verbalize understanding of ongoing HEP.  (Target Date: 04/25/2015)   Time 2   Period Months   Status New   PT LONG TERM GOAL #2   Title Patient transfers w/c to mat squat pivot with supervision.  (Target Date: 04/25/2015)   Time 2   Period Months   Status New   PT LONG TERM GOAL #3   Title Patient demonstrates standing balance with RW managing pants for toileting with supervision.  (Target Date: 04/25/2015)   Time 2   Period Months   Status New   PT LONG TERM GOAL #4   Title Patient ambulates 22' with RW with daughters assist safely.  (Target Date: 04/25/2015)   Time 2   Period Months   Status New               Plan - 03/03/15 1207    Clinical Impression Statement Pt. was seen with 2 daughters present.  HEP reviewed with daughters and pt. daughters verbalized understanding of HEP.  Pt. tolerated gait trials with RW well showing improved technique with theraband  tied to RW for visual target for bilateral LE advancement.  Frequent verbal cues required for proper transfer technique and sequencing.  Pt. progressing toward goals.   Pt will benefit from skilled therapeutic intervention in order to improve on the following deficits Abnormal gait;Decreased activity tolerance;Decreased balance;Decreased endurance;Decreased mobility;Decreased safety awareness;Decreased strength;Postural dysfunction;Pain   Rehab Potential Good   PT Frequency 2x / week   PT Duration Other (comment)  9 weeks (60 days)   PT Treatment/Interventions ADLs/Self Care Home Management;DME Instruction;Gait training;Stair training;Functional mobility training;Therapeutic activities;Therapeutic exercise;Balance training;Neuromuscular re-education;Patient/family  education   PT Next Visit Plan transfers & gait training: NuStep, Balance activities.   Consulted and Agree with Plan of Care Patient;Family member/caregiver   Family Member Consulted 2 daughters        Problem List Patient Active Problem List   Diagnosis Date Noted  . External hemorrhoids without complication [id number]  . Abdominal pain 12/18/2014  . Nausea without vomiting 12/18/2014  . UTI (lower urinary tract infection) 12/18/2014  . Urinary tract infectious disease   . Chest pain 10/31/2014  . Nausea and vomiting 10/31/2014  . Nausea with vomiting   . Mild cognitive impairment with memory loss 07/25/2014  . Malnourished ([place]) 07/25/2014  . Recurrent UTI 08/07/2013  . History of CVA (cerebrovascular accident) 06/02/2013  . Unspecified constipation 03/16/2013  . Left-sided weakness 03/09/2013  . Possible Seizures 02/14/2013  . TIA (transient ischemic attack) 02/12/2013  . Hyperglycemia 02/12/2013  . High cholesterol   . Atrial fibrillation ([place]) 02/05/2013  . Chronic anticoagulation 02/05/2013  . HTN (hypertension) 11/14/2012  . Right ureteral stone 09/15/2010  . Atrial flutter ([place]) 07/17/2010  . Osteopenia of  the elderly 06/03/2009  . MIGRAINE HEADACHE 02/05/2008  . CATARACTS, BILATERAL 02/05/2008  . DECREASED HEARING 02/05/2008  . GEN OSTEOARTHROSIS INVOLVING MULTIPLE SITES 02/05/2008  . COLONIC POLYPS, HX OF 02/05/2008    Bess [place] 03/03/2015, 12:13 PM  [place], [place]   Name: [name] MRN: [id number] Date of Birth: 02-Jan-1925  This note has been reviewed and edited by supervising CI.  This entire session was performed under direct supervision and direction of a licensed therapist/therapist assistant . I have personally read, edited and approve of the note as written.

## 2015-03-06 ENCOUNTER — Encounter: Payer: Self-pay | Admitting: Internal Medicine

## 2015-03-07 ENCOUNTER — Encounter: Payer: Self-pay | Admitting: Physical Therapy

## 2015-03-07 ENCOUNTER — Ambulatory Visit: Payer: Medicare Other | Admitting: Physical Therapy

## 2015-03-07 DIAGNOSIS — M546 Pain in thoracic spine: Secondary | ICD-10-CM

## 2015-03-07 DIAGNOSIS — M623 Immobility syndrome (paraplegic): Secondary | ICD-10-CM | POA: Diagnosis not present

## 2015-03-07 DIAGNOSIS — Z7409 Other reduced mobility: Secondary | ICD-10-CM | POA: Diagnosis not present

## 2015-03-07 DIAGNOSIS — R6889 Other general symptoms and signs: Secondary | ICD-10-CM | POA: Diagnosis not present

## 2015-03-07 DIAGNOSIS — M2560 Stiffness of unspecified joint, not elsewhere classified: Secondary | ICD-10-CM

## 2015-03-07 DIAGNOSIS — R269 Unspecified abnormalities of gait and mobility: Secondary | ICD-10-CM | POA: Diagnosis not present

## 2015-03-07 DIAGNOSIS — R531 Weakness: Secondary | ICD-10-CM

## 2015-03-09 NOTE — Therapy (Signed)
[facility] [address] [place], [place], [address] Phone: [phone]   Fax:  [phone]  Physical Therapy Treatment  Patient Details  Name: [name] MRN: [id number] Date of Birth: 1924/11/02 Referring Provider: Antony Contras, MD  Encounter Date: 03/07/2015   03/07/15 1021  PT Visits / Re-Eval  Visit Number 4  Number of Visits 18  Date for PT Re-Evaluation 04/25/15  Authorization  Authorization Type Medicare G-Code & Progress report  PT Time Calculation  PT Start Time 1018  PT Stop Time 1102  PT Time Calculation (min) 44 min  PT - End of Session  Equipment Utilized During Treatment Gait belt  Activity Tolerance Patient limited by pain;Patient tolerated treatment well  Behavior During Therapy [facility] for tasks assessed/performed     Past Medical History  Diagnosis Date  . Arthritis   . Hypertension   . Migraine   . Colon polyp   . Hearing difficulty   . Torus palatinus   . Atrial flutter ([place]) January, 2012  . Stroke ([place]) 08/02/12     right lenticular nucleus infarct  . Left leg weakness 02/05/2013  . Chronic anticoagulation   . High cholesterol   . Atrial fibrillation ([place])     on Eliquis Rx  . Mild cognitive impairment with memory loss   . Malnutrition ([place])   . TIA (transient ischemic attack)   . Constipation   . Osteoporosis   . Hemorrhoids     Past Surgical History  Procedure Laterality Date  . Cataract extraction w/ intraocular lens  implant, bilateral  2006-2008  . Ganglion cyst excision Bilateral 1938,1954,2003,2005    "wrists/hand" (08/01/2012)  . Cardioversion  05/19/2010    Dr. Einar Gip  . Tonsillectomy  ~ 1935  . Appendectomy  02/19/53    `  . Mouth surgery      [name]    There were no vitals filed for this visit.  Visit Diagnosis:  Weakness generalized  Stiffness due to immobility  Decreased functional activity tolerance  Abnormality of gait  Decreased transfer  ability  Midline thoracic back pain     03/07/15 1021  Symptoms/Limitations  Subjective No new complaints. No falls or pain to report. HEP going okay when they remember to do it.  Pain Assessment  Currently in Pain? No/denies  Pain Score 0      03/07/15 0001  Transfers  Transfers Sit to Stand;Stand to Sit;Squat Pivot Transfers  Sit to Stand 4: Min assist;With upper extremity assist;With armrests;From chair/3-in-1;4: Min guard  Sit to Stand Details Verbal cues for technique;Verbal cues for sequencing;Verbal cues for precautions/safety;Tactile cues for sequencing;Tactile cues for posture;Tactile cues for weight shifting  Sit to Stand Details (indicate cue type and reason) blocked practice on sit<>stand transfers with emphasis on hand placement, anterior weight shifting and sequencing including scooting to edge of surface.                       Stand to Sit 4: Min assist;With upper extremity assist;With armrests;To chair/3-in-1;4: Min guard  Stand to Sit Details (indicate cue type and reason) Verbal cues for technique;Verbal cues for precautions/safety;Tactile cues for weight shifting;Tactile cues for sequencing  Stand to Sit Details cues for hand placement and knee/hip flexion to assist with sitting down.  Squat Pivot Transfers 4: Min assist;With upper extremity assistance;With armrests  Squat Pivot Transfer Details (indicate cue type and reason) from w/c to mat table with walker assitance. cues on hand placement and  sequencing needed.,  Comments blocked practice of sit<>stand with emphasis on sequencing and technique.  Ambulation/Gait  Ambulation/Gait Yes  Ambulation/Gait Assistance 4: Min guard;4: Min assist  Ambulation/Gait Assistance Details multimodal cues on sequence, increase step/stride length and for posture/walker position with gait.   Ambulation Distance (Feet) 45 Feet (x1, 48 x1)  Assistive device Rolling walker  Gait Pattern Step-to pattern;Decreased step length -  left;Decreased stance time - right;Decreased stride length;Decreased hip/knee flexion - left;Decreased hip/knee flexion - right;Shuffle;Right flexed knee in stance;Left flexed knee in stance;Lateral hip instability;Trunk flexed  Ambulation Surface Level;Indoor             PT Short Term Goals - 02/24/15 1100    PT SHORT TERM GOAL #1   Title Patient and daughters demonstrate understanding of initial HEP. (Target Date: 03/27/2015)   Time 1   Period Months   Status New   PT SHORT TERM GOAL #2   Title Sit to / from stand chairs with armrests to RW with supervision.  (Target Date: 03/27/2015)   Time 1   Period Months   Status New   PT SHORT TERM GOAL #3   Title Patient ambulates 41' with RW with minimal guard.  (Target Date: 03/27/2015)   Time 1   Period Months   Status New           PT Long Term Goals - 02/24/15 1100    PT LONG TERM GOAL #1   Title Patient and daughters demonstrate / verbalize understanding of ongoing HEP.  (Target Date: 04/25/2015)   Time 2   Period Months   Status New   PT LONG TERM GOAL #2   Title Patient transfers w/c to mat squat pivot with supervision.  (Target Date: 04/25/2015)   Time 2   Period Months   Status New   PT LONG TERM GOAL #3   Title Patient demonstrates standing balance with RW managing pants for toileting with supervision.  (Target Date: 04/25/2015)   Time 2   Period Months   Status New   PT LONG TERM GOAL #4   Title Patient ambulates 27' with RW with daughters assist safely.  (Target Date: 04/25/2015)   Time 2   Period Months   Status New        03/07/15 1021  Plan  Clinical Impression Statement Pt with improved sit<>stand technique after blocked practice in session. Pt also demo'd increased overall gait distances this session. Pt is making steady progress toward goals  Pt will benefit from skilled therapeutic intervention in order to improve on the following deficits Abnormal gait;Decreased activity tolerance;Decreased  balance;Decreased endurance;Decreased mobility;Decreased safety awareness;Decreased strength;Postural dysfunction;Pain  Rehab Potential Good  PT Frequency 2x / week  PT Duration Other (comment) (9 weeks (60 days))  PT Treatment/Interventions ADLs/Self Care Home Management;DME Instruction;Gait training;Stair training;Functional mobility training;Therapeutic activities;Therapeutic exercise;Balance training;Neuromuscular re-education;Patient/family education  PT Next Visit Plan transfers & gait training: NuStep, Balance activities.  Consulted and Agree with Plan of Care Patient;Family member/caregiver  Family Member Consulted 2 daughters       Problem List Patient Active Problem List   Diagnosis Date Noted  . External hemorrhoids without complication [id number]  . Abdominal pain 12/18/2014  . Nausea without vomiting 12/18/2014  . UTI (lower urinary tract infection) 12/18/2014  . Urinary tract infectious disease   . Chest pain 10/31/2014  . Nausea and vomiting 10/31/2014  . Nausea with vomiting   . Mild cognitive impairment with memory loss 07/25/2014  . Malnourished ([place]) 07/25/2014  .  Recurrent UTI 08/07/2013  . History of CVA (cerebrovascular accident) 06/02/2013  . Unspecified constipation 03/16/2013  . Left-sided weakness 03/09/2013  . Possible Seizures 02/14/2013  . TIA (transient ischemic attack) 02/12/2013  . Hyperglycemia 02/12/2013  . High cholesterol   . Atrial fibrillation ([place]) 02/05/2013  . Chronic anticoagulation 02/05/2013  . HTN (hypertension) 11/14/2012  . Right ureteral stone 09/15/2010  . Atrial flutter ([place]) 07/17/2010  . Osteopenia of the elderly 06/03/2009  . MIGRAINE HEADACHE 02/05/2008  . CATARACTS, BILATERAL 02/05/2008  . DECREASED HEARING 02/05/2008  . GEN OSTEOARTHROSIS INVOLVING MULTIPLE SITES 02/05/2008  . COLONIC POLYPS, HX OF 02/05/2008    Willow Ora 03/09/2015, 10:44 PM  Willow Ora, PTA, [facility] [address] [place], [place] [address] [phone] 03/09/2015, 10:44 PM   Name: [name] MRN: [id number] Date of Birth: March 06, 1925

## 2015-03-11 ENCOUNTER — Encounter: Payer: Self-pay | Admitting: Internal Medicine

## 2015-03-11 ENCOUNTER — Encounter: Payer: Medicare Other | Admitting: Physical Therapy

## 2015-03-14 ENCOUNTER — Ambulatory Visit: Payer: Medicare Other | Admitting: Physical Therapy

## 2015-03-14 ENCOUNTER — Encounter: Payer: Self-pay | Admitting: Physical Therapy

## 2015-03-14 DIAGNOSIS — Z7409 Other reduced mobility: Secondary | ICD-10-CM

## 2015-03-14 DIAGNOSIS — R6889 Other general symptoms and signs: Secondary | ICD-10-CM

## 2015-03-14 DIAGNOSIS — R531 Weakness: Secondary | ICD-10-CM

## 2015-03-14 DIAGNOSIS — M2560 Stiffness of unspecified joint, not elsewhere classified: Secondary | ICD-10-CM

## 2015-03-14 DIAGNOSIS — M546 Pain in thoracic spine: Secondary | ICD-10-CM | POA: Diagnosis not present

## 2015-03-14 DIAGNOSIS — R269 Unspecified abnormalities of gait and mobility: Secondary | ICD-10-CM | POA: Diagnosis not present

## 2015-03-14 DIAGNOSIS — M623 Immobility syndrome (paraplegic): Secondary | ICD-10-CM | POA: Diagnosis not present

## 2015-03-14 NOTE — Patient Instructions (Signed)
Hamstring Stretch, Seated (Strap, Two Chairs)    Sit with one leg extended onto facing chair/stool. DO NOT USE STRAP AT THIS TIME. Let the knee relax and hold the propped position for 1-2 minutes. Have foot high enough on chair/stool to allow for a gentle stretch, not painful.  Repeat _2-3_ times each leg.  Copyright  VHI. All rights reserved.

## 2015-03-14 NOTE — Therapy (Signed)
[facility] [address] [place], [place], [address] Phone: [phone]   Fax:  [phone]  Physical Therapy Treatment  Patient Details  Name: [name] MRN: [id number] Date of Birth: 03/24/1925 Referring Provider: Antony Contras, MD  Encounter Date: 03/14/2015   03/14/15 1024  PT Visits / Re-Eval  Visit Number 5  Number of Visits 18  Date for PT Re-Evaluation 04/25/15  Authorization  Authorization Type Medicare G-Code & Progress report  PT Time Calculation  PT Start Time 1017  PT Stop Time 1100  PT Time Calculation (min) 43 min  PT - End of Session  Equipment Utilized During Treatment Gait belt  Activity Tolerance Patient limited by pain;Patient tolerated treatment well  Behavior During Therapy [facility] for tasks assessed/performed      Past Medical History  Diagnosis Date  . Arthritis   . Hypertension   . Migraine   . Colon polyp   . Hearing difficulty   . Torus palatinus   . Atrial flutter ([place]) January, 2012  . Stroke ([place]) 08/02/12     right lenticular nucleus infarct  . Left leg weakness 02/05/2013  . Chronic anticoagulation   . High cholesterol   . Atrial fibrillation ([place])     on Eliquis Rx  . Mild cognitive impairment with memory loss   . Malnutrition ([place])   . TIA (transient ischemic attack)   . Constipation   . Osteoporosis   . Hemorrhoids     Past Surgical History  Procedure Laterality Date  . Cataract extraction w/ intraocular lens  implant, bilateral  2006-2008  . Ganglion cyst excision Bilateral 1938,1954,2003,2005    "wrists/hand" (08/01/2012)  . Cardioversion  05/19/2010    Dr. Einar Gip  . Tonsillectomy  ~ 1935  . Appendectomy  02/19/53    `  . Mouth surgery      [name]    There were no vitals filed for this visit.  Visit Diagnosis:  Weakness generalized  Stiffness due to immobility  Decreased functional activity tolerance  Abnormality of gait  Decreased transfer  ability  Midline thoracic back pain      Subjective Assessment - 03/14/15 1021    Subjective No new complaints. No falls or pain to report. HEP still going well.   Currently in Pain? No/denies   Pain Score 0-No pain        03/14/15 1025  Transfers  Sit to Stand 4: Min assist;With upper extremity assist;With armrests;From chair/3-in-1;4: Min guard  Sit to Stand Details Verbal cues for technique;Verbal cues for sequencing;Verbal cues for precautions/safety;Tactile cues for sequencing;Tactile cues for posture;Tactile cues for weight shifting  Stand to Sit 4: Min assist;With upper extremity assist;With armrests;To chair/3-in-1;4: Min guard  Stand to Sit Details (indicate cue type and reason) Verbal cues for technique;Verbal cues for precautions/safety;Tactile cues for weight shifting;Tactile cues for sequencing  Stand to Sit Details cues to turn and back up all the way to hte surface before sitting down as pt has tendency to attempt to sit prematurely.  Squat Pivot Transfers 4: Min assist;With upper extremity assistance;With armrests  Squat Pivot Transfer Details (indicate cue type and reason) from w/c <> mat table with walker, cues on posture and sequency  Ambulation/Gait  Ambulation/Gait Yes  Ambulation/Gait Assistance 4: Min guard;4: Min assist  Ambulation/Gait Assistance Details cues on posture, step length, and walker position with gait each time.  Ambulation Distance (Feet) 52 Feet (x1, 70 x1)  Assistive device Rolling walker  Gait Pattern Step-to pattern;Decreased  step length - left;Decreased stance time - right;Decreased stride length;Decreased hip/knee flexion - left;Decreased hip/knee flexion - right;Shuffle;Right flexed knee in stance;Left flexed knee in stance;Lateral hip instability;Trunk flexed  Ambulation Surface Level;Indoor    Exercises On mat - bridge 5 sec hold x 10 reps, cues on hold times - short arc quad with 1# ankle weights x 10 each side, cues to slow down  with reps - straight leg raises, 1# ankle weight on right side only, x 10 reps with assist for control and to slow descent with each rep - long arc quad with 1# ankle weights bil legs x 10 each side with cues on form and technique - alternating marching 1# ankle weight x 10 each side, cues for upright posture - seated hamstring stretching to bil legs with emphasis on left side. Hold time of 20 sec's x 3 reps each side. (these were passive stretching) - educated on hamstring stretching with use of stool to prop up foot, and to ensure knee extension, 20 sec's x 3 each side. This was added to HEP.         PT Short Term Goals - 02/24/15 1100    PT SHORT TERM GOAL #1   Title Patient and daughters demonstrate understanding of initial HEP. (Target Date: 03/27/2015)   Time 1   Period Months   Status New   PT SHORT TERM GOAL #2   Title Sit to / from stand chairs with armrests to RW with supervision.  (Target Date: 03/27/2015)   Time 1   Period Months   Status New   PT SHORT TERM GOAL #3   Title Patient ambulates 53' with RW with minimal guard.  (Target Date: 03/27/2015)   Time 1   Period Months   Status New           PT Long Term Goals - 02/24/15 1100    PT LONG TERM GOAL #1   Title Patient and daughters demonstrate / verbalize understanding of ongoing HEP.  (Target Date: 04/25/2015)   Time 2   Period Months   Status New   PT LONG TERM GOAL #2   Title Patient transfers w/c to mat squat pivot with supervision.  (Target Date: 04/25/2015)   Time 2   Period Months   Status New   PT LONG TERM GOAL #3   Title Patient demonstrates standing balance with RW managing pants for toileting with supervision.  (Target Date: 04/25/2015)   Time 2   Period Months   Status New   PT LONG TERM GOAL #4   Title Patient ambulates 12' with RW with daughters assist safely.  (Target Date: 04/25/2015)   Time 2   Period Months   Status New      03/14/15 1025  Plan  Clinical Impression Statement Pt  making steady progress toward goals. Continues to need cues on safety with most aspects of mobility.  Pt will benefit from skilled therapeutic intervention in order to improve on the following deficits Abnormal gait;Decreased activity tolerance;Decreased balance;Decreased endurance;Decreased mobility;Decreased safety awareness;Decreased strength;Postural dysfunction;Pain  Rehab Potential Good  PT Frequency 2x / week  PT Duration Other (comment) (9 weeks (60 days))  PT Treatment/Interventions ADLs/Self Care Home Management;DME Instruction;Gait training;Stair training;Functional mobility training;Therapeutic activities;Therapeutic exercise;Balance training;Neuromuscular re-education;Patient/family education  PT Next Visit Plan transfers & gait training: NuStep, Balance activities.  Consulted and Agree with Plan of Care Patient;Family member/caregiver  Family Member Consulted 2 daughters      Problem List Patient Active Problem List  Diagnosis Date Noted  . External hemorrhoids without complication [id number]  . Abdominal pain 12/18/2014  . Nausea without vomiting 12/18/2014  . UTI (lower urinary tract infection) 12/18/2014  . Urinary tract infectious disease   . Chest pain 10/31/2014  . Nausea and vomiting 10/31/2014  . Nausea with vomiting   . Mild cognitive impairment with memory loss 07/25/2014  . Malnourished ([place]) 07/25/2014  . Recurrent UTI 08/07/2013  . History of CVA (cerebrovascular accident) 06/02/2013  . Unspecified constipation 03/16/2013  . Left-sided weakness 03/09/2013  . Possible Seizures 02/14/2013  . TIA (transient ischemic attack) 02/12/2013  . Hyperglycemia 02/12/2013  . High cholesterol   . Atrial fibrillation ([place]) 02/05/2013  . Chronic anticoagulation 02/05/2013  . HTN (hypertension) 11/14/2012  . Right ureteral stone 09/15/2010  . Atrial flutter ([place]) 07/17/2010  . Osteopenia of the elderly 06/03/2009  . MIGRAINE HEADACHE 02/05/2008  . CATARACTS,  BILATERAL 02/05/2008  . DECREASED HEARING 02/05/2008  . GEN OSTEOARTHROSIS INVOLVING MULTIPLE SITES 02/05/2008  . COLONIC POLYPS, HX OF 02/05/2008    Willow Ora 03/14/2015, 10:46 AM  Willow Ora, PTA, [facility] [address] [place], [place] [address] [phone] 03/16/2015, 11:32 PM   Name: [name] MRN: [id number] Date of Birth: 08/14/1924

## 2015-03-18 ENCOUNTER — Encounter: Payer: Self-pay | Admitting: Physical Therapy

## 2015-03-18 ENCOUNTER — Ambulatory Visit: Payer: Medicare Other | Admitting: Physical Therapy

## 2015-03-18 DIAGNOSIS — Z7409 Other reduced mobility: Secondary | ICD-10-CM

## 2015-03-18 DIAGNOSIS — R269 Unspecified abnormalities of gait and mobility: Secondary | ICD-10-CM | POA: Diagnosis not present

## 2015-03-18 DIAGNOSIS — R6889 Other general symptoms and signs: Secondary | ICD-10-CM | POA: Diagnosis not present

## 2015-03-18 DIAGNOSIS — M546 Pain in thoracic spine: Secondary | ICD-10-CM | POA: Diagnosis not present

## 2015-03-18 DIAGNOSIS — M623 Immobility syndrome (paraplegic): Secondary | ICD-10-CM | POA: Diagnosis not present

## 2015-03-18 DIAGNOSIS — R531 Weakness: Secondary | ICD-10-CM

## 2015-03-18 DIAGNOSIS — M2560 Stiffness of unspecified joint, not elsewhere classified: Secondary | ICD-10-CM

## 2015-03-18 NOTE — Therapy (Signed)
[facility] [address] [place], [place], [address] Phone: [phone]   Fax:  [phone]  Physical Therapy Treatment  Patient Details  Name: [name] MRN: [id number] Date of Birth: March 14, 1925 Referring Provider: Antony Contras, MD  Encounter Date: 03/18/2015      PT End of Session - 03/18/15 1105    Visit Number 6   Number of Visits 18   Date for PT Re-Evaluation 04/25/15   Authorization Type Medicare G-Code & Progress report   PT Start Time 1018   PT Stop Time 1105   PT Time Calculation (min) 47 min   Equipment Utilized During Treatment Gait belt   Activity Tolerance Patient limited by pain;Patient tolerated treatment well   Behavior During Therapy [facility] for tasks assessed/performed      Past Medical History  Diagnosis Date  . Arthritis   . Hypertension   . Migraine   . Colon polyp   . Hearing difficulty   . Torus palatinus   . Atrial flutter ([place]) January, 2012  . Stroke ([place]) 08/02/12     right lenticular nucleus infarct  . Left leg weakness 02/05/2013  . Chronic anticoagulation   . High cholesterol   . Atrial fibrillation ([place])     on Eliquis Rx  . Mild cognitive impairment with memory loss   . Malnutrition ([place])   . TIA (transient ischemic attack)   . Constipation   . Osteoporosis   . Hemorrhoids     Past Surgical History  Procedure Laterality Date  . Cataract extraction w/ intraocular lens  implant, bilateral  2006-2008  . Ganglion cyst excision Bilateral 1938,1954,2003,2005    "wrists/hand" (08/01/2012)  . Cardioversion  05/19/2010    Dr. Einar Gip  . Tonsillectomy  ~ 1935  . Appendectomy  02/19/53    `  . Mouth surgery      [name]    There were no vitals filed for this visit.  Visit Diagnosis:  Weakness generalized  Stiffness due to immobility  Decreased functional activity tolerance  Abnormality of gait  Decreased transfer ability  Midline thoracic back pain       Subjective Assessment - 03/18/15 1018    Subjective No falls. Exercises are going well.    Patient is accompained by: Family member   Currently in Pain? Yes   Pain Score 5    Pain Location Back   Pain Orientation Upper   Pain Descriptors / Indicators Aching   Pain Type Chronic pain   Pain Onset More than a month ago   Pain Frequency Constant   Aggravating Factors  standing & gait or sitting too long   Pain Relieving Factors laying down or recline in lazy boy chair   Effect of Pain on Daily Activities limited standing /gait or sitting tolerance   Multiple Pain Sites No                         [facility] Adult PT Treatment/Exercise - 03/18/15 1100    Transfers   Sit to Stand 4: Min assist;With upper extremity assist;With armrests;From chair/3-in-1;4: Min guard   Sit to Stand Details Verbal cues for technique;Verbal cues for sequencing;Verbal cues for precautions/safety;Tactile cues for sequencing;Tactile cues for posture;Tactile cues for weight shifting   Sit to Stand Details (indicate cue type and reason) cues on hand placement & upright posture to stabilze upon arising   Stand to Sit 4: Min assist;With upper extremity assist;With armrests;To chair/3-in-1;4: Min  guard   Stand to Sit Details (indicate cue type and reason) Verbal cues for technique;Verbal cues for precautions/safety;Tactile cues for weight shifting;Tactile cues for sequencing   Stand to Sit Details visual & verbal cues on turning to position prior to sitting   Squat Pivot Transfers 4: Min assist;With upper extremity assistance;With armrests   Squat Pivot Transfer Details (indicate cue type and reason) tactile, verbal & visual cues on technique   Ambulation/Gait   Ambulation/Gait Yes   Ambulation/Gait Assistance 4: Min guard;4: Min assist   Ambulation/Gait Assistance Details theraband around posterior pelvis for tactile cues on position within RW   Ambulation Distance (Feet) 70 Feet  70' & 30' X 4 working on  positioning prior to sitting   Assistive device Rolling walker   Gait Pattern Step-to pattern;Decreased step length - left;Decreased stance time - right;Decreased stride length;Decreased hip/knee flexion - left;Decreased hip/knee flexion - right;Shuffle;Right flexed knee in stance;Left flexed knee in stance;Lateral hip instability;Trunk flexed   Ambulation Surface Indoor;Level                PT Education - 03/18/15 1105    Education provided Yes   Education Details PT reviewed hamstring / heelcord stretch and seated shoulder horizontal abduction /scapular retraction. PT arranged for orthotist to bring TLSO for trial during PT session   Person(s) Educated Patient;Child(ren)   Methods Explanation;Demonstration;Tactile cues;Verbal cues   Comprehension Verbalized understanding;Returned demonstration;Verbal cues required;Tactile cues required;Need further instruction          PT Short Term Goals - 02/24/15 1100    PT SHORT TERM GOAL #1   Title Patient and daughters demonstrate understanding of initial HEP. (Target Date: 03/27/2015)   Time 1   Period Months   Status New   PT SHORT TERM GOAL #2   Title Sit to / from stand chairs with armrests to RW with supervision.  (Target Date: 03/27/2015)   Time 1   Period Months   Status New   PT SHORT TERM GOAL #3   Title Patient ambulates 17' with RW with minimal guard.  (Target Date: 03/27/2015)   Time 1   Period Months   Status New           PT Long Term Goals - 02/24/15 1100    PT LONG TERM GOAL #1   Title Patient and daughters demonstrate / verbalize understanding of ongoing HEP.  (Target Date: 04/25/2015)   Time 2   Period Months   Status New   PT LONG TERM GOAL #2   Title Patient transfers w/c to mat squat pivot with supervision.  (Target Date: 04/25/2015)   Time 2   Period Months   Status New   PT LONG TERM GOAL #3   Title Patient demonstrates standing balance with RW managing pants for toileting with supervision.   (Target Date: 04/25/2015)   Time 2   Period Months   Status New   PT LONG TERM GOAL #4   Title Patient ambulates 5' with RW with daughters assist safely.  (Target Date: 04/25/2015)   Time 2   Period Months   Status New               Plan - 03/18/15 1105    Clinical Impression Statement Patient improved positioning prior to sitting with visual cue of mid-point of chair prior to sitting. Patient would benefit from a TLSO to support trunk for standing / gait or prolonged sitting to enable community outings without back pain incresing to intolerable  level.    Pt will benefit from skilled therapeutic intervention in order to improve on the following deficits Abnormal gait;Decreased activity tolerance;Decreased balance;Decreased endurance;Decreased mobility;Decreased safety awareness;Decreased strength;Postural dysfunction;Pain   Rehab Potential Good   PT Frequency 2x / week   PT Duration Other (comment)  9 weeks (60 days)   PT Treatment/Interventions ADLs/Self Care Home Management;DME Instruction;Gait training;Stair training;Functional mobility training;Therapeutic activities;Therapeutic exercise;Balance training;Neuromuscular re-education;Patient/family education   PT Next Visit Plan Try TLSO to assess if improves back pain; transfers & gait training: NuStep, Balance activities.   Consulted and Agree with Plan of Care Patient;Family member/caregiver   Family Member Consulted 2 daughters        Problem List Patient Active Problem List   Diagnosis Date Noted  . External hemorrhoids without complication [id number]  . Abdominal pain 12/18/2014  . Nausea without vomiting 12/18/2014  . UTI (lower urinary tract infection) 12/18/2014  . Urinary tract infectious disease   . Chest pain 10/31/2014  . Nausea and vomiting 10/31/2014  . Nausea with vomiting   . Mild cognitive impairment with memory loss 07/25/2014  . Malnourished ([place]) 07/25/2014  . Recurrent UTI 08/07/2013  . History  of CVA (cerebrovascular accident) 06/02/2013  . Unspecified constipation 03/16/2013  . Left-sided weakness 03/09/2013  . Possible Seizures 02/14/2013  . TIA (transient ischemic attack) 02/12/2013  . Hyperglycemia 02/12/2013  . High cholesterol   . Atrial fibrillation ([place]) 02/05/2013  . Chronic anticoagulation 02/05/2013  . HTN (hypertension) 11/14/2012  . Right ureteral stone 09/15/2010  . Atrial flutter ([place]) 07/17/2010  . Osteopenia of the elderly 06/03/2009  . MIGRAINE HEADACHE 02/05/2008  . CATARACTS, BILATERAL 02/05/2008  . DECREASED HEARING 02/05/2008  . GEN OSTEOARTHROSIS INVOLVING MULTIPLE SITES 02/05/2008  . COLONIC POLYPS, HX OF 02/05/2008    Jamey Reas PT, DPT 03/18/2015, 1:59 PM  [facility] [address] [place], [place], [address] Phone: [phone]   Fax:  [phone]  Name: [name] MRN: [id number] Date of Birth: 08-May-1924

## 2015-03-20 ENCOUNTER — Other Ambulatory Visit: Payer: Medicare Other

## 2015-03-20 ENCOUNTER — Ambulatory Visit (INDEPENDENT_AMBULATORY_CARE_PROVIDER_SITE_OTHER): Payer: Medicare Other

## 2015-03-20 ENCOUNTER — Ambulatory Visit: Payer: Medicare Other | Admitting: Physical Therapy

## 2015-03-20 ENCOUNTER — Encounter: Payer: Self-pay | Admitting: Physical Therapy

## 2015-03-20 DIAGNOSIS — M2560 Stiffness of unspecified joint, not elsewhere classified: Secondary | ICD-10-CM

## 2015-03-20 DIAGNOSIS — M546 Pain in thoracic spine: Secondary | ICD-10-CM | POA: Diagnosis not present

## 2015-03-20 DIAGNOSIS — Z7409 Other reduced mobility: Secondary | ICD-10-CM | POA: Diagnosis not present

## 2015-03-20 DIAGNOSIS — E0590 Thyrotoxicosis, unspecified without thyrotoxic crisis or storm: Secondary | ICD-10-CM

## 2015-03-20 DIAGNOSIS — M899 Disorder of bone, unspecified: Secondary | ICD-10-CM

## 2015-03-20 DIAGNOSIS — R531 Weakness: Secondary | ICD-10-CM

## 2015-03-20 DIAGNOSIS — R269 Unspecified abnormalities of gait and mobility: Secondary | ICD-10-CM

## 2015-03-20 DIAGNOSIS — M623 Immobility syndrome (paraplegic): Secondary | ICD-10-CM | POA: Diagnosis not present

## 2015-03-20 DIAGNOSIS — R6889 Other general symptoms and signs: Secondary | ICD-10-CM

## 2015-03-20 DIAGNOSIS — M810 Age-related osteoporosis without current pathological fracture: Secondary | ICD-10-CM

## 2015-03-20 DIAGNOSIS — M8580 Other specified disorders of bone density and structure, unspecified site: Secondary | ICD-10-CM

## 2015-03-20 MED ORDER — DENOSUMAB 60 MG/ML ~~LOC~~ SOLN
60.0000 mg | Freq: Once | SUBCUTANEOUS | Status: AC
  Administered 2015-03-20: 60 mg via SUBCUTANEOUS

## 2015-03-21 LAB — T4, FREE: FREE T4: 1.27 ng/dL (ref 0.82–1.77)

## 2015-03-21 NOTE — Therapy (Signed)
[facility] [address] [place], [place], [address] Phone: [phone]   Fax:  [phone]  Physical Therapy Treatment  Patient Details  Name: [name] MRN: [id number] Date of Birth: [birth year] Referring Provider: Antony Contras, MD  Encounter Date: 03/20/2015      PT End of Session - 03/20/15 1021    Visit Number 7   Number of Visits 18   Date for PT Re-Evaluation 04/25/15   Authorization Type Medicare G-Code & Progress report   PT Start Time 1016   PT Stop Time 1100   PT Time Calculation (min) 44 min   Equipment Utilized During Treatment Gait belt   Activity Tolerance Patient limited by pain;Patient tolerated treatment well   Behavior During Therapy [facility] for tasks assessed/performed      Past Medical History  Diagnosis Date  . Arthritis   . Hypertension   . Migraine   . Colon polyp   . Hearing difficulty   . Torus palatinus   . Atrial flutter ([place]) January, 2012  . Stroke ([place]) 08/02/12     right lenticular nucleus infarct  . Left leg weakness 02/05/2013  . Chronic anticoagulation   . High cholesterol   . Atrial fibrillation ([place])     on Eliquis Rx  . Mild cognitive impairment with memory loss   . Malnutrition ([place])   . TIA (transient ischemic attack)   . Constipation   . Osteoporosis   . Hemorrhoids     Past Surgical History  Procedure Laterality Date  . Cataract extraction w/ intraocular lens  implant, bilateral  2006-2008  . Ganglion cyst excision Bilateral 1938,1954,2003,2005    "wrists/hand" (08/01/2012)  . Cardioversion  05/19/2010    Dr. Einar Gip  . Tonsillectomy  ~ 1935  . Appendectomy  02/19/53    `  . Mouth surgery      [name]    There were no vitals filed for this visit.  Visit Diagnosis:  Weakness generalized  Stiffness due to immobility  Decreased functional activity tolerance  Abnormality of gait  Decreased transfer ability  Midline thoracic back pain       Subjective Assessment - 03/20/15 1018    Subjective No new complaints. No falls  to report.    Pain Score 1    Pain Location Back   Pain Orientation Upper;Right   Pain Descriptors / Indicators Aching;Sore   Pain Type Chronic pain   Pain Onset More than a month ago   Pain Frequency Constant   Aggravating Factors  increased standing or sitting too long    Pain Relieving Factors rest (lying down) ro reclined in lazy boy chair           [facility] Adult PT Treatment/Exercise - 03/20/15 1043    Transfers   Sit to Stand 4: Min assist;With upper extremity assist;With armrests;From chair/3-in-1;4: Min guard   Stand to Sit 4: Min assist;With upper extremity assist;With armrests;To chair/3-in-1;4: Min guard   Ambulation/Gait   Ambulation/Gait Yes   Ambulation/Gait Assistance 4: Min guard;4: Min assist   Ambulation Distance (Feet) 48 Feet  x1, 58 x1, 68 x1, 78 x1   Assistive device Rolling walker   Gait Pattern Step-to pattern;Decreased step length - left;Decreased stance time - right;Decreased stride length;Decreased hip/knee flexion - left;Decreased hip/knee flexion - right;Shuffle;Right flexed knee in stance;Left flexed knee in stance;Lateral hip instability;Trunk flexed   Ambulation Surface Level;Indoor     Fitted pt with TLSO brace prior to gait. Pt's daughters shown  how to properly don/doff brace.   Used theraband at pt's pelvis (posterio) attached to bil sides of walker to provide tactile cues on walker positioning with gait. Pt's daughters shown how to attach the shower hook and un attach it from walker.         PT Short Term Goals - 02/24/15 1100    PT SHORT TERM GOAL #1   Title Patient and daughters demonstrate understanding of initial HEP. (Target Date: 03/27/2015)   Time 1   Period Months   Status New   PT SHORT TERM GOAL #2   Title Sit to / from stand chairs with armrests to RW with supervision.  (Target Date: 03/27/2015)   Time 1   Period Months   Status New   PT  SHORT TERM GOAL #3   Title Patient ambulates 30' with RW with minimal guard.  (Target Date: 03/27/2015)   Time 1   Period Months   Status New           PT Long Term Goals - 02/24/15 1100    PT LONG TERM GOAL #1   Title Patient and daughters demonstrate / verbalize understanding of ongoing HEP.  (Target Date: 04/25/2015)   Time 2   Period Months   Status New   PT LONG TERM GOAL #2   Title Patient transfers w/c to mat squat pivot with supervision.  (Target Date: 04/25/2015)   Time 2   Period Months   Status New   PT LONG TERM GOAL #3   Title Patient demonstrates standing balance with RW managing pants for toileting with supervision.  (Target Date: 04/25/2015)   Time 2   Period Months   Status New   PT LONG TERM GOAL #4   Title Patient ambulates 21' with RW with daughters assist safely.  (Target Date: 04/25/2015)   Time 2   Period Months   Status New           Plan - 03/20/15 1021    Clinical Impression Statement Pt was fitted with TLSO brace today for trial to see if if will decrease her pain to allow her to sit for longer periods of time. Will reassess this next week. Continued to use the theraband across back of pt's pelvis for tactile cues to promote correct walker positioning with gait. Attached the band to plastic shower hooks to allow for quick release. Pt is making steady progress toward goals                                         Pt will benefit from skilled therapeutic intervention in order to improve on the following deficits Abnormal gait;Decreased activity tolerance;Decreased balance;Decreased endurance;Decreased mobility;Decreased safety awareness;Decreased strength;Postural dysfunction;Pain   Rehab Potential Good   PT Frequency 2x / week   PT Duration Other (comment)  9 weeks (60 days)   PT Treatment/Interventions ADLs/Self Care Home Management;DME Instruction;Gait training;Stair training;Functional mobility training;Therapeutic activities;Therapeutic  exercise;Balance training;Neuromuscular re-education;Patient/family education   PT Next Visit Plan ; transfers & gait training: NuStep, Balance activities.   Consulted and Agree with Plan of Care Patient;Family member/caregiver   Family Member Consulted 2 daughters        Problem List Patient Active Problem List   Diagnosis Date Noted  . External hemorrhoids without complication [id number]  . Abdominal pain 12/18/2014  . Nausea without vomiting 12/18/2014  . UTI (lower urinary tract  infection) 12/18/2014  . Urinary tract infectious disease   . Chest pain 10/31/2014  . Nausea and vomiting 10/31/2014  . Nausea with vomiting   . Mild cognitive impairment with memory loss 07/25/2014  . Malnourished ([place]) 07/25/2014  . Recurrent UTI 08/07/2013  . History of CVA (cerebrovascular accident) 06/02/2013  . Unspecified constipation 03/16/2013  . Left-sided weakness 03/09/2013  . Possible Seizures 02/14/2013  . TIA (transient ischemic attack) 02/12/2013  . Hyperglycemia 02/12/2013  . High cholesterol   . Atrial fibrillation ([place]) 02/05/2013  . Chronic anticoagulation 02/05/2013  . HTN (hypertension) 11/14/2012  . Right ureteral stone 09/15/2010  . Atrial flutter ([place]) 07/17/2010  . Osteopenia of the elderly 06/03/2009  . MIGRAINE HEADACHE 02/05/2008  . CATARACTS, BILATERAL 02/05/2008  . DECREASED HEARING 02/05/2008  . GEN OSTEOARTHROSIS INVOLVING MULTIPLE SITES 02/05/2008  . COLONIC POLYPS, HX OF 02/05/2008    Willow Ora 03/21/2015, 3:47 PM  Willow Ora, PTA, [facility] [address] [place], [place] [address] [phone] 03/21/2015, 3:48 PM   Name: [name] MRN: [id number] Date of Birth: 09-04-1924

## 2015-03-25 ENCOUNTER — Ambulatory Visit: Payer: Medicare Other | Admitting: Physical Therapy

## 2015-03-25 ENCOUNTER — Encounter: Payer: Self-pay | Admitting: Physical Therapy

## 2015-03-25 ENCOUNTER — Other Ambulatory Visit: Payer: Self-pay | Admitting: Neurology

## 2015-03-25 DIAGNOSIS — R531 Weakness: Secondary | ICD-10-CM

## 2015-03-25 DIAGNOSIS — M546 Pain in thoracic spine: Secondary | ICD-10-CM | POA: Diagnosis not present

## 2015-03-25 DIAGNOSIS — Z7409 Other reduced mobility: Secondary | ICD-10-CM

## 2015-03-25 DIAGNOSIS — R6889 Other general symptoms and signs: Secondary | ICD-10-CM

## 2015-03-25 DIAGNOSIS — M2560 Stiffness of unspecified joint, not elsewhere classified: Secondary | ICD-10-CM

## 2015-03-25 DIAGNOSIS — M623 Immobility syndrome (paraplegic): Secondary | ICD-10-CM | POA: Diagnosis not present

## 2015-03-25 DIAGNOSIS — R269 Unspecified abnormalities of gait and mobility: Secondary | ICD-10-CM

## 2015-03-26 NOTE — Therapy (Signed)
[name] [address] [place], [place], [address] Phone: [phone]   Fax:  [phone]  Physical Therapy Treatment  Patient Details  Name: [name] MRN: [id number] Date of Birth: [birth year] Referring Provider: Antony Contras, MD  Encounter Date: 03/25/2015      PT End of Session - 03/25/15 1110    Visit Number 8   Number of Visits 18   Date for PT Re-Evaluation 04/25/15   Authorization Type Medicare G-Code & Progress report   PT Start Time 1102   PT Stop Time 1145   PT Time Calculation (min) 43 min   Equipment Utilized During Treatment Gait belt   Activity Tolerance Patient limited by pain;Patient tolerated treatment well   Behavior During Therapy [facility] for tasks assessed/performed      Past Medical History  Diagnosis Date  . Arthritis   . Hypertension   . Migraine   . Colon polyp   . Hearing difficulty   . Torus palatinus   . Atrial flutter ([place]) January, 2012  . Stroke ([place]) 08/02/12     right lenticular nucleus infarct  . Left leg weakness 02/05/2013  . Chronic anticoagulation   . High cholesterol   . Atrial fibrillation ([place])     on Eliquis Rx  . Mild cognitive impairment with memory loss   . Malnutrition ([place])   . TIA (transient ischemic attack)   . Constipation   . Osteoporosis   . Hemorrhoids     Past Surgical History  Procedure Laterality Date  . Cataract extraction w/ intraocular lens  implant, bilateral  2006-2008  . Ganglion cyst excision Bilateral 1938,1954,2003,2005    "wrists/hand" (08/01/2012)  . Cardioversion  05/19/2010    Dr. Einar Gip  . Tonsillectomy  ~ 1935  . Appendectomy  02/19/53    `  . Mouth surgery      [name]    There were no vitals filed for this visit.  Visit Diagnosis:  Weakness generalized  Stiffness due to immobility  Decreased functional activity tolerance  Abnormality of gait  Decreased transfer ability  Midline thoracic back pain       Subjective Assessment - 03/25/15 1106    Subjective No new complaints. No falls to report. Have been trialing the brace, daughter reports that pt was able to sit close to 30 mintues in chair before pt complained of pain. (ususally this occurs after 10-15 minutes). They would like to try it for awhile longer before deciding fully on getting one. Also using the strap on the walker to keep pt in/close to walker as needed. Pt does not like it and usually will correct her positioning with just cues/threat of using strap by daughters.                                            Currently in Pain? Yes   Pain Score 2    Pain Location Back   Pain Orientation Right;Upper   Pain Descriptors / Indicators Aching;Sore   Pain Type Chronic pain   Pain Onset More than a month ago   Aggravating Factors  increased sitting or standing   Pain Relieving Factors rest (lying down), reclining in lazy boy chair             [facility] Adult PT Treatment/Exercise - 03/25/15 1117    Transfers   Sit to Stand  4: Min guard;With upper extremity assist;With armrests;From chair/3-in-1   Stand to Sit 4: Min guard;4: Min assist;With upper extremity assist;With armrests;To chair/3-in-1;Uncontrolled descent   Ambulation/Gait   Ambulation/Gait Yes   Ambulation/Gait Assistance 4: Min guard;4: Min assist   Ambulation/Gait Assistance Details multimodal cues on posture, to increase left step length and height with gait and for walker position with gait.   Ambulation Distance (Feet) 54 Feet  x2, 64 x2   Assistive device Rolling walker   Gait Pattern Step-to pattern;Decreased step length - left;Decreased stance time - right;Decreased stride length;Decreased hip/knee flexion - left;Decreased hip/knee flexion - right;Shuffle;Right flexed knee in stance;Left flexed knee in stance;Lateral hip instability;Trunk flexed   Ambulation Surface Level;Indoor     Seated forward in wheelchair so not to have back support: - alternating UE  reaching x 12 reps each side - rows with yellow theraband, 4 sets of 10 reps.  Pt needed verbal, visual and manual facilitation for correct posture and ex form. Daughter educated and assisted with theraband hold so to allow PTA to facilitate upright/midline posture with this exercise.          PT Short Term Goals - 03/26/15 1145    PT SHORT TERM GOAL #1   Title Patient and daughters demonstrate understanding of initial HEP. (Target Date: 03/27/2015)   Baseline met on 03/25/15   Status Achieved   PT SHORT TERM GOAL #2   Title Sit to / from stand chairs with armrests to RW with supervision.  (Target Date: 03/27/2015)   Status On-going   PT SHORT TERM GOAL #3   Title Patient ambulates 43' with RW with minimal guard.  (Target Date: 03/27/2015)   Status On-going           PT Long Term Goals - 02/24/15 1100    PT LONG TERM GOAL #1   Title Patient and daughters demonstrate / verbalize understanding of ongoing HEP.  (Target Date: 04/25/2015)   Time 2   Period Months   Status New   PT LONG TERM GOAL #2   Title Patient transfers w/c to mat squat pivot with supervision.  (Target Date: 04/25/2015)   Time 2   Period Months   Status New   PT LONG TERM GOAL #3   Title Patient demonstrates standing balance with RW managing pants for toileting with supervision.  (Target Date: 04/25/2015)   Time 2   Period Months   Status New   PT LONG TERM GOAL #4   Title Patient ambulates 58' with RW with daughters assist safely.  (Target Date: 04/25/2015)   Time 2   Period Months   Status New           Plan - 03/25/15 1111    Clinical Impression Statement STG #1 met. Pt progressing toward all other goals. Plan to trial TLSO for a few more days before family/pt make a decision on whether to get it or not. Discussed them actutally going out to eat at pt's favorite resturan to see if brace allows her to sit to eat without increased pain/discomfort. Daughter plans to try this.                                       Pt will benefit from skilled therapeutic intervention in order to improve on the following deficits Abnormal gait;Decreased activity tolerance;Decreased balance;Decreased endurance;Decreased mobility;Decreased safety awareness;Decreased strength;Postural dysfunction;Pain   Rehab Potential  Good   PT Frequency 2x / week   PT Duration Other (comment)  9 weeks (60 days)   PT Treatment/Interventions ADLs/Self Care Home Management;DME Instruction;Gait training;Stair training;Functional mobility training;Therapeutic activities;Therapeutic exercise;Balance training;Neuromuscular re-education;Patient/family education   PT Next Visit Plan assess remaining STGs;transfers & gait training: NuStep, Balance activities.   Consulted and Agree with Plan of Care Patient;Family member/caregiver   Family Member Consulted 2 daughters        Problem List Patient Active Problem List   Diagnosis Date Noted  . External hemorrhoids without complication [id number]  . Abdominal pain 12/18/2014  . Nausea without vomiting 12/18/2014  . UTI (lower urinary tract infection) 12/18/2014  . Urinary tract infectious disease   . Chest pain 10/31/2014  . Nausea and vomiting 10/31/2014  . Nausea with vomiting   . Mild cognitive impairment with memory loss 07/25/2014  . Malnourished ([place]) 07/25/2014  . Recurrent UTI 08/07/2013  . History of CVA (cerebrovascular accident) 06/02/2013  . Unspecified constipation 03/16/2013  . Left-sided weakness 03/09/2013  . Possible Seizures 02/14/2013  . TIA (transient ischemic attack) 02/12/2013  . Hyperglycemia 02/12/2013  . High cholesterol   . Atrial fibrillation ([place]) 02/05/2013  . Chronic anticoagulation 02/05/2013  . HTN (hypertension) 11/14/2012  . Right ureteral stone 09/15/2010  . Atrial flutter ([place]) 07/17/2010  . Osteopenia of the elderly 06/03/2009  . MIGRAINE HEADACHE 02/05/2008  . CATARACTS, BILATERAL 02/05/2008  . DECREASED HEARING 02/05/2008  . GEN  OSTEOARTHROSIS INVOLVING MULTIPLE SITES 02/05/2008  . COLONIC POLYPS, HX OF 02/05/2008    Willow Ora 03/26/2015, 11:48 AM  Willow Ora, PTA, [facility] [address] [place], [place] [address] [phone] 03/26/2015, 11:49 AM   Name: SOFFIA DOSHIER MRN: [id number] Date of Birth: March 18, 1925

## 2015-03-27 ENCOUNTER — Ambulatory Visit: Payer: Medicare Other | Admitting: Physical Therapy

## 2015-03-27 ENCOUNTER — Telehealth: Payer: Self-pay | Admitting: Physical Therapy

## 2015-03-27 ENCOUNTER — Encounter: Payer: Self-pay | Admitting: Physical Therapy

## 2015-03-27 DIAGNOSIS — R531 Weakness: Secondary | ICD-10-CM

## 2015-03-27 DIAGNOSIS — M623 Immobility syndrome (paraplegic): Secondary | ICD-10-CM | POA: Diagnosis not present

## 2015-03-27 DIAGNOSIS — Z7409 Other reduced mobility: Secondary | ICD-10-CM

## 2015-03-27 DIAGNOSIS — R269 Unspecified abnormalities of gait and mobility: Secondary | ICD-10-CM | POA: Diagnosis not present

## 2015-03-27 DIAGNOSIS — M546 Pain in thoracic spine: Secondary | ICD-10-CM | POA: Diagnosis not present

## 2015-03-27 DIAGNOSIS — R6889 Other general symptoms and signs: Secondary | ICD-10-CM | POA: Diagnosis not present

## 2015-03-27 DIAGNOSIS — M2560 Stiffness of unspecified joint, not elsewhere classified: Secondary | ICD-10-CM

## 2015-03-27 NOTE — Telephone Encounter (Signed)
Dr. Leonie Gregory, [name] has been using a C.A.S.H. Thoracic Lumbar Sacral Orthosis to support her back with kyphotic posture. The orthosis has improved her sitting, standing and walking tolerance with less pain. Can you please write a prescription to cover this orthosis for her?  Jamey Reas, PT, DPT PT Specializing in [place] @TODAY @ 3:01 PM Phone:  [phone]  Fax:  [phone] [facility] [address] [place], [place] [address]

## 2015-03-27 NOTE — Therapy (Signed)
[facility] [address] [place], [place], [address] Phone: [phone]   Fax:  [phone]  Physical Therapy Treatment  Patient Details  Name: [name] MRN: [id number] Date of Birth: 06/17/1924 Referring Provider: Delia Heady, MD  Encounter Date: 03/27/2015      PT End of Session - 03/27/15 1109    Visit Number 9   Number of Visits 18   Date for PT Re-Evaluation 04/25/15   Authorization Type Medicare G-Code & Progress report   PT Start Time 1101   PT Stop Time 1145   PT Time Calculation (min) 44 min   Equipment Utilized During Treatment Gait belt   Activity Tolerance Patient limited by pain;Patient tolerated treatment well   Behavior During Therapy [facility] for tasks assessed/performed      Past Medical History  Diagnosis Date  . Arthritis   . Hypertension   . Migraine   . Colon polyp   . Hearing difficulty   . Torus palatinus   . Atrial flutter (HCC) January, 2012  . Stroke (HCC) 08/02/12     right lenticular nucleus infarct  . Left leg weakness 02/05/2013  . Chronic anticoagulation   . High cholesterol   . Atrial fibrillation (HCC)     on Eliquis Rx  . Mild cognitive impairment with memory loss   . Malnutrition (HCC)   . TIA (transient ischemic attack)   . Constipation   . Osteoporosis   . Hemorrhoids     Past Surgical History  Procedure Laterality Date  . Cataract extraction w/ intraocular lens  implant, bilateral  2006-2008  . Ganglion cyst excision Bilateral 1938,1954,2003,2005    "wrists/hand" (08/01/2012)  . Cardioversion  05/19/2010    Dr. Jacinto Halim  . Tonsillectomy  ~ 1935  . Appendectomy  02/19/53    `  . Mouth surgery      [name]    There were no vitals filed for this visit.  Visit Diagnosis:  Weakness generalized  Stiffness due to immobility  Decreased functional activity tolerance  Abnormality of gait  Decreased transfer ability      Subjective Assessment - 03/27/15 1106     Subjective No new complaints. No falls. Pt reports having some pain across upper back (thoracic/scapular areas). Have continued to trial the brace, all (pt/daughter's) still undecided.    Currently in Pain? Yes   Pain Location Back   Pain Orientation Right;Upper   Pain Descriptors / Indicators Aching;Sore   Pain Type Chronic pain   Pain Onset More than a month ago   Pain Frequency Constant   Aggravating Factors  increased sitting or standing   Pain Relieving Factors rest (lying down), recliniung in laxy boy chair              [facility] Adult PT Treatment/Exercise - 03/27/15 1110    Transfers   Sit to Stand 5: Supervision;With upper extremity assist;From chair/3-in-1;With armrests   Stand to Sit 5: Supervision;With upper extremity assist;With armrests;To chair/3-in-1   Ambulation/Gait   Ambulation/Gait Yes   Ambulation/Gait Assistance 4: Min guard;4: Min assist   Ambulation/Gait Assistance Details verbal/visual cues on posture and increased left step lenght with gait. increased assistance needed with turns to manage walker                                   Ambulation Distance (Feet) 115 Feet  x 2 reps   Assistive device  Rolling walker   Gait Pattern Step-to pattern;Decreased step length - left;Decreased stance time - right;Decreased stride length;Decreased hip/knee flexion - left;Decreased hip/knee flexion - right;Shuffle;Right flexed knee in stance;Left flexed knee in stance;Lateral hip instability;Trunk flexed   Ambulation Surface Level;Indoor   Knee/Hip Exercises: Standing   Heel Raises Both;1 set;10 reps  toe raises performed separately as well;UE support on chair   Heel Raises Limitations cues for knee extension with the exercises and for increased raise height with both heel and toe raises                               Hip Flexion AROM;Stengthening;Both;2 sets;5 reps;Knee bent;Limitations  UE support on chair   Hip Flexion Limitations cues on upright posture and "high" knee  marcing with exercise   Hip Abduction AAROM;Stengthening;Both;10 reps;Knee straight;Limitations  UE support on chiar   Abduction Limitations cues on posture, to maintain knee extension with exercise   Shoulder Exercises: Seated   Retraction AROM;Strengthening;Both;10 reps;Limitations  "W", performed in standing   Retraction Limitations cues on posture and ex form with exercise   Flexion AROM;Strengthening;Both;10 reps;Limitations  performed in standing   Flexion Limitations alternating UE raises x 10 each side, then bil simultaneous UE raises x 10 reps, cues on posture and ex form/technique                            Abduction AROM;Strengthening;Both;10 reps;Limitations  "angel wings",  performed in standing   ABduction Limitations cues on posture and ex form.   Shoulder Exercises: Standing   Other Standing Exercises Upper trunk rotation: one hand support on chair with contralateral UE reaching back with upper trunk rotation x 8 each side. min assist with cues on posture                                     PT Short Term Goals - 03/27/15 1109    PT SHORT TERM GOAL #1   Title Patient and daughters demonstrate understanding of initial HEP. (Target Date: 03/27/2015)   Baseline met on 03/25/15   Status Achieved   PT SHORT TERM GOAL #2   Title Sit to / from stand chairs with armrests to RW with supervision.  (Target Date: 03/27/2015)   Baseline met on 03/27/15   Status Achieved   PT SHORT TERM GOAL #3   Title Patient ambulates 60' with RW with minimal guard.  (Target Date: 03/27/2015)   Baseline met on 03/27/15   Status Achieved           PT Long Term Goals - 02/24/15 1100    PT LONG TERM GOAL #1   Title Patient and daughters demonstrate / verbalize understanding of ongoing HEP.  (Target Date: 04/25/2015)   Time 2   Period Months   Status New   PT LONG TERM GOAL #2   Title Patient transfers w/c to mat squat pivot with supervision.  (Target Date: 04/25/2015)   Time 2   Period  Months   Status New   PT LONG TERM GOAL #3   Title Patient demonstrates standing balance with RW managing pants for toileting with supervision.  (Target Date: 04/25/2015)   Time 2   Period Months   Status New   PT LONG TERM GOAL #4   Title Patient ambulates 100' with RW  with daughters assist safely.  (Target Date: 04/25/2015)   Time 2   Period Months   Status New            Plan - 03/27/15 1109    Clinical Impression Statement Remaining STGs met. Pt with great progress today with increased gait distance and all exercises performed in standing. Pt reported minimal increase in pain, 3/10 after session. Daughter's still unsure on TLSO, want to trial it again for longer period of time before commiting. Will discuess with primary PT and orthotist.                          Pt will benefit from skilled therapeutic intervention in order to improve on the following deficits Abnormal gait;Decreased activity tolerance;Decreased balance;Decreased endurance;Decreased mobility;Decreased safety awareness;Decreased strength;Postural dysfunction;Pain   Rehab Potential Good   PT Frequency 2x / week   PT Duration Other (comment)  9 weeks (60 days)   PT Treatment/Interventions ADLs/Self Care Home Management;DME Instruction;Gait training;Stair training;Functional mobility training;Therapeutic activities;Therapeutic exercise;Balance training;Neuromuscular re-education;Patient/family education   PT Next Visit Plan transfers & gait training: NuStep, Balance activities.   Consulted and Agree with Plan of Care Patient;Family member/caregiver   Family Member Consulted 2 daughters        Problem List Patient Active Problem List   Diagnosis Date Noted  . External hemorrhoids without complication 84/21/0312  . Abdominal pain 12/18/2014  . Nausea without vomiting 12/18/2014  . UTI (lower urinary tract infection) 12/18/2014  . Urinary tract infectious disease   . Chest pain 10/31/2014  . Nausea and  vomiting 10/31/2014  . Nausea with vomiting   . Mild cognitive impairment with memory loss 07/25/2014  . Malnourished ([place]) 07/25/2014  . Recurrent UTI 08/07/2013  . History of CVA (cerebrovascular accident) 06/02/2013  . Unspecified constipation 03/16/2013  . Left-sided weakness 03/09/2013  . Possible Seizures 02/14/2013  . TIA (transient ischemic attack) 02/12/2013  . Hyperglycemia 02/12/2013  . High cholesterol   . Atrial fibrillation ([place]) 02/05/2013  . Chronic anticoagulation 02/05/2013  . HTN (hypertension) 11/14/2012  . Right ureteral stone 09/15/2010  . Atrial flutter ([place]) 07/17/2010  . Osteopenia of the elderly 06/03/2009  . MIGRAINE HEADACHE 02/05/2008  . CATARACTS, BILATERAL 02/05/2008  . DECREASED HEARING 02/05/2008  . GEN OSTEOARTHROSIS INVOLVING MULTIPLE SITES 02/05/2008  . COLONIC POLYPS, HX OF 02/05/2008    [name] 03/27/2015, 3:10 PM  [name], PTA, [facility] [address] [place], [place] [address] [phone] 03/27/2015, 3:10 PM   Name: [name] MRN: [id number] Date of Birth: Mar 25, 1925

## 2015-04-01 ENCOUNTER — Ambulatory Visit: Payer: Medicare Other | Attending: Neurology | Admitting: Physical Therapy

## 2015-04-01 ENCOUNTER — Other Ambulatory Visit: Payer: Self-pay

## 2015-04-01 ENCOUNTER — Encounter: Payer: Self-pay | Admitting: Physical Therapy

## 2015-04-01 DIAGNOSIS — R270 Ataxia, unspecified: Secondary | ICD-10-CM

## 2015-04-01 DIAGNOSIS — R269 Unspecified abnormalities of gait and mobility: Secondary | ICD-10-CM | POA: Diagnosis not present

## 2015-04-01 DIAGNOSIS — R531 Weakness: Secondary | ICD-10-CM

## 2015-04-01 DIAGNOSIS — R6889 Other general symptoms and signs: Secondary | ICD-10-CM | POA: Diagnosis not present

## 2015-04-01 DIAGNOSIS — Z7409 Other reduced mobility: Secondary | ICD-10-CM | POA: Insufficient documentation

## 2015-04-01 DIAGNOSIS — M546 Pain in thoracic spine: Secondary | ICD-10-CM | POA: Diagnosis not present

## 2015-04-01 DIAGNOSIS — M623 Immobility syndrome (paraplegic): Secondary | ICD-10-CM | POA: Insufficient documentation

## 2015-04-01 NOTE — Therapy (Signed)
[facility] [address] [place], [place], [address] Phone: [phone]   Fax:  [phone]  Physical Therapy Treatment  Patient Details  Name: [name] MRN: [id number] Date of Birth: [birth year] Referring Provider: Antony Contras, MD  Encounter Date: 04/01/2015      PT End of Session - 04/01/15 1150    Visit Number 10   Number of Visits 18   Date for PT Re-Evaluation 04/25/15   Authorization Type Medicare G-Code & Progress report   PT Start Time 1101   PT Stop Time 1150   PT Time Calculation (min) 49 min   Equipment Utilized During Treatment Gait belt   Activity Tolerance Patient limited by pain;Patient tolerated treatment well   Behavior During Therapy [facility] for tasks assessed/performed      Past Medical History  Diagnosis Date  . Arthritis   . Hypertension   . Migraine   . Colon polyp   . Hearing difficulty   . Torus palatinus   . Atrial flutter ([place]) January, 2012  . Stroke ([place]) 08/02/12     right lenticular nucleus infarct  . Left leg weakness 02/05/2013  . Chronic anticoagulation   . High cholesterol   . Atrial fibrillation ([place])     on Eliquis Rx  . Mild cognitive impairment with memory loss   . Malnutrition ([place])   . TIA (transient ischemic attack)   . Constipation   . Osteoporosis   . Hemorrhoids     Past Surgical History  Procedure Laterality Date  . Cataract extraction w/ intraocular lens  implant, bilateral  2006-2008  . Ganglion cyst excision Bilateral 1938,1954,2003,2005    "wrists/hand" (08/01/2012)  . Cardioversion  05/19/2010    Dr. Einar Gip  . Tonsillectomy  ~ 1935  . Appendectomy  02/19/53    `  . Mouth surgery      [name]    There were no vitals filed for this visit.  Visit Diagnosis:  Weakness generalized  Decreased functional activity tolerance  Abnormality of gait  Decreased transfer ability  Midline thoracic back pain                       [facility]  Adult PT Treatment/Exercise - 04/01/15 1100    Transfers   Sit to Stand 5: Supervision;With upper extremity assist;From chair/3-in-1;With armrests   Stand to Sit 5: Supervision;With upper extremity assist;With armrests;To chair/3-in-1   Ambulation/Gait   Ambulation/Gait Yes   Ambulation/Gait Assistance 4: Min guard;4: Min assist   Ambulation/Gait Assistance Details visual, tactile & verbal cues on posture, step length & wt shift.   Ambulation Distance (Feet) 115 Feet  x 2 reps   Assistive device Rolling walker   Gait Pattern Step-to pattern;Decreased step length - left;Decreased stance time - right;Decreased stride length;Decreased hip/knee flexion - left;Decreased hip/knee flexion - right;Shuffle;Right flexed knee in stance;Left flexed knee in stance;Lateral hip instability;Trunk flexed   Ambulation Surface Indoor;Level             Balance Exercises - 04/01/15 1100    OTAGO PROGRAM   Head Movements Sitting;5 reps   Neck Movements Sitting;5 reps   Back Extension Standing;5 reps  RW support   Trunk Movements Standing;5 reps  RW support   Ankle Movements Sitting;10 reps   Knee Extensor 10 reps  alternating   Knee Flexor 10 reps  RW support           PT Education - 04/01/15 1145  Education provided Yes   Education Details C.A.S.H.  TLSO with PT recommending for use for all outings from home to support back including medical appts,  adjusting foot plates on w/c to position hips at 90*   Person(s) Educated Patient;Child(ren)   Methods Explanation;Verbal cues   Comprehension Verbalized understanding;Tactile cues required          PT Short Term Goals - 03/27/15 1109    PT SHORT TERM GOAL #1   Title Patient and daughters demonstrate understanding of initial HEP. (Target Date: 03/27/2015)   Baseline met on 03/25/15   Status Achieved   PT SHORT TERM GOAL #2   Title Sit to / from stand chairs with armrests to RW with supervision.  (Target Date: 03/27/2015)   Baseline  met on 03/27/15   Status Achieved   PT SHORT TERM GOAL #3   Title Patient ambulates 104' with RW with minimal guard.  (Target Date: 03/27/2015)   Baseline met on 03/27/15   Status Achieved           PT Long Term Goals - 02/24/15 1100    PT LONG TERM GOAL #1   Title Patient and daughters demonstrate / verbalize understanding of ongoing HEP.  (Target Date: 04/25/2015)   Time 2   Period Months   Status New   PT LONG TERM GOAL #2   Title Patient transfers w/c to mat squat pivot with supervision.  (Target Date: 04/25/2015)   Time 2   Period Months   Status New   PT LONG TERM GOAL #3   Title Patient demonstrates standing balance with RW managing pants for toileting with supervision.  (Target Date: 04/25/2015)   Time 2   Period Months   Status New   PT LONG TERM GOAL #4   Title Patient ambulates 47' with RW with daughters assist safely.  (Target Date: 04/25/2015)   Time 2   Period Months   Status New               Plan - 04-27-2015 1150    Clinical Impression Statement Patient has improved gait with RW with instruction. Family reports they are seeing carryover to her gait at home. They would like to trial TLSO one more time and PT recommended if we get it again to make sure it is used for a community outing or medical appt.    Pt will benefit from skilled therapeutic intervention in order to improve on the following deficits Abnormal gait;Decreased activity tolerance;Decreased balance;Decreased endurance;Decreased mobility;Decreased safety awareness;Decreased strength;Postural dysfunction;Pain   Rehab Potential Good   PT Frequency 2x / week   PT Duration Other (comment)  9 weeks (60 days)   PT Treatment/Interventions ADLs/Self Care Home Management;DME Instruction;Gait training;Stair training;Functional mobility training;Therapeutic activities;Therapeutic exercise;Balance training;Neuromuscular re-education;Patient/family education   PT Next Visit Plan Set new mobility G-Code,  Continue [place] instruction, gait training.    Consulted and Agree with Plan of Care Patient;Family member/caregiver   Family Member Consulted 2 daughters          G-Codes - Apr 27, 2015 1150    Functional Assessment Tool Used squat-pivot transfer with supervision, sit to/from stand with tactile cues.    Functional Limitation Changing and maintaining body position   Changing and Maintaining Body Position Goal Status [phone]) At least 40 percent but less than 60 percent impaired, limited or restricted   Changing and Maintaining Body Position Discharge Status ([id number]) At least 40 percent but less than 60 percent impaired, limited or restricted  Problem List Patient Active Problem List   Diagnosis Date Noted  . External hemorrhoids without complication 97/91/5041  . Abdominal pain 12/18/2014  . Nausea without vomiting 12/18/2014  . UTI (lower urinary tract infection) 12/18/2014  . Urinary tract infectious disease   . Chest pain 10/31/2014  . Nausea and vomiting 10/31/2014  . Nausea with vomiting   . Mild cognitive impairment with memory loss 07/25/2014  . Malnourished ([place]) 07/25/2014  . Recurrent UTI 08/07/2013  . History of CVA (cerebrovascular accident) 06/02/2013  . Unspecified constipation 03/16/2013  . Left-sided weakness 03/09/2013  . Possible Seizures 02/14/2013  . TIA (transient ischemic attack) 02/12/2013  . Hyperglycemia 02/12/2013  . High cholesterol   . Atrial fibrillation ([place]) 02/05/2013  . Chronic anticoagulation 02/05/2013  . HTN (hypertension) 11/14/2012  . Right ureteral stone 09/15/2010  . Atrial flutter ([place]) 07/17/2010  . Osteopenia of the elderly 06/03/2009  . MIGRAINE HEADACHE 02/05/2008  . CATARACTS, BILATERAL 02/05/2008  . DECREASED HEARING 02/05/2008  . GEN OSTEOARTHROSIS INVOLVING MULTIPLE SITES 02/05/2008  . COLONIC POLYPS, HX OF 02/05/2008    Jamey Reas PT, DPT 04/01/2015, 3:41 PM  [facility] [address] [place], [place], [address] Phone: [phone]   Fax:  [phone]  Name: RILEYANN FLORANCE MRN: [id number] Date of Birth: Jul 16, 1924

## 2015-04-01 NOTE — Telephone Encounter (Signed)
Ok to do so Katrina I will need help to do this

## 2015-04-01 NOTE — Telephone Encounter (Signed)
I received the prescription for Ms. Jock TLSO. Thank you Jamey Reas, PT

## 2015-04-01 NOTE — Therapy (Signed)
[facility] [address] [place], [place], [address] Phone: [phone]   Fax:  [phone]  Patient Details  Name: [name] MRN: [id number] Date of Birth: March 26, 1925 Referring Provider:  Leonie Man Encounter Date: 04/01/2015   Physical Therapy Progress Note  Dates of Reporting Period: 02/24/2015 to 04/01/2015  Objective Reports of Subjective Statement: Family reports improved gait & transfers for mobility at home.   Objective Measurements: Patient transfers squat-pivot with supervision and sit to/from stand with supervision. Patient ambulates 69' with rolling walker with minA.   Goal Update:      PT Short Term Goals - 03/27/15 1109    PT SHORT TERM GOAL #1   Title Patient and daughters demonstrate understanding of initial HEP. (Target Date: 03/27/2015)   Baseline met on 03/25/15   Status Achieved   PT SHORT TERM GOAL #2   Title Sit to / from stand chairs with armrests to RW with supervision.  (Target Date: 03/27/2015)   Baseline met on 03/27/15   Status Achieved   PT SHORT TERM GOAL #3   Title Patient ambulates 68' with RW with minimal guard.  (Target Date: 03/27/2015)   Baseline met on 03/27/15   Status Achieved      Plan: Continue towards PT plan of care in evaluation.   Reason Skilled Services are Required: Patient's gait is unsafe and high fall risk without PT intervention.    Jamey Reas PT, DPT 04/01/2015, 3:43 PM  [facility] [address] [place], [place], [address] Phone: [phone]   Fax:  [phone]

## 2015-04-03 ENCOUNTER — Encounter: Payer: Self-pay | Admitting: Physical Therapy

## 2015-04-03 ENCOUNTER — Ambulatory Visit: Payer: Medicare Other | Admitting: Physical Therapy

## 2015-04-03 DIAGNOSIS — R269 Unspecified abnormalities of gait and mobility: Secondary | ICD-10-CM

## 2015-04-03 DIAGNOSIS — M546 Pain in thoracic spine: Secondary | ICD-10-CM

## 2015-04-03 DIAGNOSIS — Z7409 Other reduced mobility: Secondary | ICD-10-CM

## 2015-04-03 DIAGNOSIS — M623 Immobility syndrome (paraplegic): Secondary | ICD-10-CM | POA: Diagnosis not present

## 2015-04-03 DIAGNOSIS — R6889 Other general symptoms and signs: Secondary | ICD-10-CM | POA: Diagnosis not present

## 2015-04-03 DIAGNOSIS — M2560 Stiffness of unspecified joint, not elsewhere classified: Secondary | ICD-10-CM

## 2015-04-03 DIAGNOSIS — R531 Weakness: Secondary | ICD-10-CM | POA: Diagnosis not present

## 2015-04-03 NOTE — Therapy (Signed)
[facility] [address] [place], [place], [address] Phone: [phone]   Fax:  [phone]  Physical Therapy Treatment  Patient Details  Name: [name] MRN: [id number] Date of Birth: 02/28/1925 Referring Provider: Delia Heady, MD  Encounter Date: 04/03/2015      PT End of Session - 04/03/15 1150    Visit Number 11   Number of Visits 18   Date for PT Re-Evaluation 04/25/15   Authorization Type Medicare G-Code & Progress report   PT Start Time 1100   PT Stop Time 1150   PT Time Calculation (min) 50 min   Equipment Utilized During Treatment Gait belt   Activity Tolerance Patient limited by pain;Patient tolerated treatment well   Behavior During Therapy [facility] for tasks assessed/performed      Past Medical History  Diagnosis Date  . Arthritis   . Hypertension   . Migraine   . Colon polyp   . Hearing difficulty   . Torus palatinus   . Atrial flutter (HCC) January, 2012  . Stroke (HCC) 08/02/12     right lenticular nucleus infarct  . Left leg weakness 02/05/2013  . Chronic anticoagulation   . High cholesterol   . Atrial fibrillation (HCC)     on Eliquis Rx  . Mild cognitive impairment with memory loss   . Malnutrition (HCC)   . TIA (transient ischemic attack)   . Constipation   . Osteoporosis   . Hemorrhoids     Past Surgical History  Procedure Laterality Date  . Cataract extraction w/ intraocular lens  implant, bilateral  2006-2008  . Ganglion cyst excision Bilateral 1938,1954,2003,2005    "wrists/hand" (08/01/2012)  . Cardioversion  05/19/2010    Dr. Jacinto Halim  . Tonsillectomy  ~ 1935  . Appendectomy  02/19/53    `  . Mouth surgery      [name]    There were no vitals filed for this visit.  Visit Diagnosis:  Weakness generalized  Decreased functional activity tolerance  Abnormality of gait  Decreased transfer ability  Midline thoracic back pain  Stiffness due to immobility      Subjective  Assessment - 04/03/15 1108    Subjective She has been doing new exercises with her daughter.    Currently in Pain? Yes   Pain Score 2    Pain Location Back   Pain Orientation Right;Upper   Pain Descriptors / Indicators Aching;Sore   Pain Type Chronic pain   Pain Onset More than a month ago   Pain Frequency Constant   Aggravating Factors  prolonged sitting or standing   Pain Relieving Factors rest (lyiing down) or reclining in Lazy Boy chair   Effect of Pain on Daily Activities limited standing or sitting                         [facility] Adult PT Treatment/Exercise - 04/03/15 1100    Transfers   Sit to Stand 5: Supervision;With upper extremity assist;From chair/3-in-1;With armrests   Stand to Sit 5: Supervision;With upper extremity assist;With armrests;To chair/3-in-1   Ambulation/Gait   Ambulation/Gait Yes   Ambulation/Gait Assistance 4: Min guard   Ambulation/Gait Assistance Details visual & verbal cues on step length L>R, posture, worn CASH TLSO with back pain 1-2/10 during gait and reports no pain within 1 minute of sitting   Ambulation Distance (Feet) 130 Feet   Assistive device Rolling walker   Gait Pattern Step-to pattern;Decreased step length -  left;Decreased stance time - right;Decreased stride length;Decreased hip/knee flexion - left;Decreased hip/knee flexion - right;Shuffle;Right flexed knee in stance;Left flexed knee in stance;Lateral hip instability;Trunk flexed   Ambulation Surface Indoor;Level             Balance Exercises - 04/03/15 1100    OTAGO PROGRAM   Head Movements Sitting;5 reps   Neck Movements Sitting;5 reps   Ankle Movements Sitting;10 reps   Knee Extensor 10 reps  alternating LEs   Knee Flexor 10 reps  with RW support, TLSO, alternating LEs   Hip ABductor 10 reps  with RW support, TLSO, alternating LEs   Ankle Plantorflexors --  10 reps, with RW support, TLSO, BLEs   Ankle Dorsiflexors --  10 reps, with RW support, TLSO, BLEs    Knee Bends 10 reps, support  with RW support, TLSO   Backwards Walking Support  with RW support, TLSO   Walking and Turning Around Assistive device  with RW support, TLSO   Overall OTAGO Comments Do not plan to add tandem stance, tandem walk, one leg stand, Heel Toe walking bacwards or stair walk to her program.   add 4 remaining sideways walk, heel walk, toe walk,stand up              PT Short Term Goals - 03/27/15 1109    PT SHORT TERM GOAL #1   Title Patient and daughters demonstrate understanding of initial HEP. (Target Date: 03/27/2015)   Baseline met on 03/25/15   Status Achieved   PT SHORT TERM GOAL #2   Title Sit to / from stand chairs with armrests to RW with supervision.  (Target Date: 03/27/2015)   Baseline met on 03/27/15   Status Achieved   PT SHORT TERM GOAL #3   Title Patient ambulates 63' with RW with minimal guard.  (Target Date: 03/27/2015)   Baseline met on 03/27/15   Status Achieved           PT Long Term Goals - 02/24/15 1100    PT LONG TERM GOAL #1   Title Patient and daughters demonstrate / verbalize understanding of ongoing HEP.  (Target Date: 04/25/2015)   Time 2   Period Months   Status New   PT LONG TERM GOAL #2   Title Patient transfers w/c to mat squat pivot with supervision.  (Target Date: 04/25/2015)   Time 2   Period Months   Status New   PT LONG TERM GOAL #3   Title Patient demonstrates standing balance with RW managing pants for toileting with supervision.  (Target Date: 04/25/2015)   Time 2   Period Months   Status New   PT LONG TERM GOAL #4   Title Patient ambulates 64' with RW with daughters assist safely.  (Target Date: 04/25/2015)   Time 2   Period Months   Status New               Plan - 04/03/15 1213    Clinical Impression Statement TLSO improved her posture, decreased back pain / fatigue and tolerated increased activities during the PT session. Patient and family to trial over weekend with intent to go out  for meal as recommendation is for use when she leaves her home for appointments or other activiites. Patient tolerated addtional exercises of [place] program. See note for plan for remainder of exercises.    Pt will benefit from skilled therapeutic intervention in order to improve on the following deficits Abnormal gait;Decreased activity tolerance;Decreased balance;Decreased endurance;Decreased mobility;Decreased  safety awareness;Decreased strength;Postural dysfunction;Pain   Rehab Potential Good   PT Frequency 2x / week   PT Duration Other (comment)  9 weeks (60 days)   PT Treatment/Interventions ADLs/Self Care Home Management;DME Instruction;Gait training;Stair training;Functional mobility training;Therapeutic activities;Therapeutic exercise;Balance training;Neuromuscular re-education;Patient/family education   PT Next Visit Plan Asses TLSO was benefical for outing, Continue [employer], gait training.    Consulted and Agree with Plan of Care Patient;Family member/caregiver   Family Member Consulted 2 daughters          G-Codes - Apr 17, 2015 1217/06/17    Functional Assessment Tool Used Patient ambulated 130' with RW with min guard with TLSO with cues. Back pain 2/10 with gait.    Functional Limitation Mobility: Walking and moving around   Mobility: Walking and Moving Around Current Status [phone]) At least 60 percent but less than 80 percent impaired, limited or restricted   Mobility: Walking and Moving Around Goal Status [phone]) At least 60 percent but less than 80 percent impaired, limited or restricted      Problem List Patient Active Problem List   Diagnosis Date Noted  . External hemorrhoids without complication 05/39/7673  . Abdominal pain 12/18/2014  . Nausea without vomiting 12/18/2014  . UTI (lower urinary tract infection) 12/18/2014  . Urinary tract infectious disease   . Chest pain 10/31/2014  . Nausea and vomiting 10/31/2014  . Nausea with vomiting   . Mild cognitive  impairment with memory loss 07/25/2014  . Malnourished ([place]) 07/25/2014  . Recurrent UTI 08/07/2013  . History of CVA (cerebrovascular accident) 06/02/2013  . Unspecified constipation 03/16/2013  . Left-sided weakness 03/09/2013  . Possible Seizures 02/14/2013  . TIA (transient ischemic attack) 02/12/2013  . Hyperglycemia 02/12/2013  . High cholesterol   . Atrial fibrillation ([place]) 02/05/2013  . Chronic anticoagulation 02/05/2013  . HTN (hypertension) 11/14/2012  . Right ureteral stone 09/15/2010  . Atrial flutter ([place]) 07/17/2010  . Osteopenia of the elderly 06-17-09  . MIGRAINE HEADACHE 02/05/2008  . CATARACTS, BILATERAL 02/05/2008  . DECREASED HEARING 02/05/2008  . GEN OSTEOARTHROSIS INVOLVING MULTIPLE SITES 02/05/2008  . COLONIC POLYPS, HX OF 02/05/2008    Jamey Reas PT, DPT 17-Apr-2015, 12:23 PM  [facility] [address] [place], [place], [address] Phone: [phone]   Fax:  [phone]  Name: [name] MRN: [id number] Date of Birth: March 25, 1925

## 2015-04-06 ENCOUNTER — Encounter: Payer: Self-pay | Admitting: Physical Therapy

## 2015-04-08 ENCOUNTER — Ambulatory Visit: Payer: Medicare Other | Admitting: Physical Therapy

## 2015-04-10 ENCOUNTER — Ambulatory Visit: Payer: Medicare Other | Admitting: Physical Therapy

## 2015-04-10 ENCOUNTER — Encounter: Payer: Self-pay | Admitting: Physical Therapy

## 2015-04-10 DIAGNOSIS — M546 Pain in thoracic spine: Secondary | ICD-10-CM

## 2015-04-10 DIAGNOSIS — R531 Weakness: Secondary | ICD-10-CM | POA: Diagnosis not present

## 2015-04-10 DIAGNOSIS — R269 Unspecified abnormalities of gait and mobility: Secondary | ICD-10-CM | POA: Diagnosis not present

## 2015-04-10 DIAGNOSIS — R6889 Other general symptoms and signs: Secondary | ICD-10-CM | POA: Diagnosis not present

## 2015-04-10 DIAGNOSIS — M623 Immobility syndrome (paraplegic): Secondary | ICD-10-CM | POA: Diagnosis not present

## 2015-04-10 DIAGNOSIS — Z7409 Other reduced mobility: Secondary | ICD-10-CM

## 2015-04-10 NOTE — Therapy (Signed)
[facility] [address] [place], [place], [address] Phone: [phone]   Fax:  [phone]  Physical Therapy Treatment  Patient Details  Name: [name] MRN: [id number] Date of Birth: 1924-12-19 Referring Provider: Antony Contras, MD  Encounter Date: 04/10/2015      PT End of Session - 04/10/15 1109    Visit Number 12   Number of Visits 18   Date for PT Re-Evaluation 04/25/15   Authorization Type Medicare G-Code & Progress report   PT Start Time 1102   PT Stop Time 1145   PT Time Calculation (min) 43 min   Equipment Utilized During Treatment Gait belt   Activity Tolerance Patient limited by pain;Patient tolerated treatment well   Behavior During Therapy [facility] for tasks assessed/performed      Past Medical History  Diagnosis Date  . Arthritis   . Hypertension   . Migraine   . Colon polyp   . Hearing difficulty   . Torus palatinus   . Atrial flutter ([place]) January, 2012  . Stroke ([place]) 08/02/12     right lenticular nucleus infarct  . Left leg weakness 02/05/2013  . Chronic anticoagulation   . High cholesterol   . Atrial fibrillation ([place])     on Eliquis Rx  . Mild cognitive impairment with memory loss   . Malnutrition ([place])   . TIA (transient ischemic attack)   . Constipation   . Osteoporosis   . Hemorrhoids     Past Surgical History  Procedure Laterality Date  . Cataract extraction w/ intraocular lens  implant, bilateral  2006-2008  . Ganglion cyst excision Bilateral 1938,1954,2003,2005    "wrists/hand" (08/01/2012)  . Cardioversion  05/19/2010    Dr. Einar Gip  . Tonsillectomy  ~ 1935  . Appendectomy  02/19/53    `  . Mouth surgery      [name]    There were no vitals filed for this visit.  Visit Diagnosis:  Weakness generalized  Decreased functional activity tolerance  Decreased transfer ability  Midline thoracic back pain  Abnormality of gait      Subjective Assessment - 04/10/15 1106     Subjective Trialed brace with meal over weekend with pt reporting increased pain after ~30 minutes. Daughter and pt do not feel it's doing what it was intended to do for her.    Patient is accompained by: Family member  daughter   Patient Stated Goals Family would like to build her stamina and improve transfers.    Currently in Pain? Yes   Pain Score 6    Pain Location Back   Pain Orientation Right;Upper   Pain Descriptors / Indicators Aching;Sore   Pain Type Chronic pain   Pain Onset More than a month ago   Pain Frequency Constant   Aggravating Factors  prolonged sitting or standing   Pain Relieving Factors rest (lying down) or reclining in Lazy Boy chair          Balance Exercises - 04/10/15 1111    OTAGO PROGRAM   Hip ABductor 10 reps  no TLSO, counter top support, no alternating iit confused pt   Ankle Plantorflexors --  10 reps with bil UE support at counter and TLSO brace on   Ankle Dorsiflexors --  10 reps, UE support on counter, TLSO on   Knee Bends 10 reps, support  counter top support, TLSO support   Backwards Walking Support  counter top/HHA support   Walking and Turning Around [employer]  device  with counter and HHA, TLSO   Sideways Walking Assistive device  counter, TLSO   Heel Walking Support  counter/HHA, TLSO   Toe Walk Support  counter/HHA, TLSO   Sit to Stand 10 reps, bilateral support             PT Short Term Goals - 03/27/15 1109    PT SHORT TERM GOAL #1   Title Patient and daughters demonstrate understanding of initial HEP. (Target Date: 03/27/2015)   Baseline met on 03/25/15   Status Achieved   PT SHORT TERM GOAL #2   Title Sit to / from stand chairs with armrests to RW with supervision.  (Target Date: 03/27/2015)   Baseline met on 03/27/15   Status Achieved   PT SHORT TERM GOAL #3   Title Patient ambulates 90' with RW with minimal guard.  (Target Date: 03/27/2015)   Baseline met on 03/27/15   Status Achieved           PT  Long Term Goals - 02/24/15 1100    PT LONG TERM GOAL #1   Title Patient and daughters demonstrate / verbalize understanding of ongoing HEP.  (Target Date: 04/25/2015)   Time 2   Period Months   Status New   PT LONG TERM GOAL #2   Title Patient transfers w/c to mat squat pivot with supervision.  (Target Date: 04/25/2015)   Time 2   Period Months   Status New   PT LONG TERM GOAL #3   Title Patient demonstrates standing balance with RW managing pants for toileting with supervision.  (Target Date: 04/25/2015)   Time 2   Period Months   Status New   PT LONG TERM GOAL #4   Title Patient ambulates 25' with RW with daughters assist safely.  (Target Date: 04/25/2015)   Time 2   Period Months   Status New           Plan - 04/10/15 1109    Clinical Impression Statement Skilled session focues on review of newly added OTAGO exercises from previous session and then adding newer ones today. Min multimodal cues needed along with min assist for balance with some as well. Utilized TLSO with improved posture noted during exercises. Pt's daughter present for session and noted improved posture and ex tolerance in session today with use TLSO and they now are on board to get one for home use.                                             Pt will benefit from skilled therapeutic intervention in order to improve on the following deficits Abnormal gait;Decreased activity tolerance;Decreased balance;Decreased endurance;Decreased mobility;Decreased safety awareness;Decreased strength;Postural dysfunction;Pain   Rehab Potential Good   PT Frequency 2x / week   PT Duration Other (comment)  9 weeks (60 days)   PT Treatment/Interventions ADLs/Self Care Home Management;DME Instruction;Gait training;Stair training;Functional mobility training;Therapeutic activities;Therapeutic exercise;Balance training;Neuromuscular re-education;Patient/family education   PT Next Visit Plan Asses TLSO was benefical for outing, Continue  [employer], gait training.    Consulted and Agree with Plan of Care Patient;Family member/caregiver   Family Member Consulted 2 daughters        Problem List Patient Active Problem List   Diagnosis Date Noted  . External hemorrhoids without complication 69/48/5462  . Abdominal pain 12/18/2014  . Nausea without vomiting 12/18/2014  .  UTI (lower urinary tract infection) 12/18/2014  . Urinary tract infectious disease   . Chest pain 10/31/2014  . Nausea and vomiting 10/31/2014  . Nausea with vomiting   . Mild cognitive impairment with memory loss 07/25/2014  . Malnourished ([place]) 07/25/2014  . Recurrent UTI 08/07/2013  . History of CVA (cerebrovascular accident) 06/02/2013  . Unspecified constipation 03/16/2013  . Left-sided weakness 03/09/2013  . Possible Seizures 02/14/2013  . TIA (transient ischemic attack) 02/12/2013  . Hyperglycemia 02/12/2013  . High cholesterol   . Atrial fibrillation ([place]) 02/05/2013  . Chronic anticoagulation 02/05/2013  . HTN (hypertension) 11/14/2012  . Right ureteral stone 09/15/2010  . Atrial flutter ([place]) 07/17/2010  . Osteopenia of the elderly 06/03/2009  . MIGRAINE HEADACHE 02/05/2008  . CATARACTS, BILATERAL 02/05/2008  . DECREASED HEARING 02/05/2008  . GEN OSTEOARTHROSIS INVOLVING MULTIPLE SITES 02/05/2008  . COLONIC POLYPS, HX OF 02/05/2008    Willow Ora 04/10/2015, 4:17 PM  Willow Ora, PTA, [facility] [address] [place], [place] [address] [phone] 04/10/2015, 4:17 PM   Name: KIRA HARTL MRN: [id number] Date of Birth: Aug 12, 1924

## 2015-04-15 ENCOUNTER — Ambulatory Visit: Payer: Medicare Other | Admitting: Physical Therapy

## 2015-04-15 ENCOUNTER — Encounter: Payer: Self-pay | Admitting: Physical Therapy

## 2015-04-15 DIAGNOSIS — R6889 Other general symptoms and signs: Secondary | ICD-10-CM

## 2015-04-15 DIAGNOSIS — M623 Immobility syndrome (paraplegic): Secondary | ICD-10-CM | POA: Diagnosis not present

## 2015-04-15 DIAGNOSIS — R531 Weakness: Secondary | ICD-10-CM | POA: Diagnosis not present

## 2015-04-15 DIAGNOSIS — R269 Unspecified abnormalities of gait and mobility: Secondary | ICD-10-CM | POA: Diagnosis not present

## 2015-04-15 DIAGNOSIS — Z7409 Other reduced mobility: Secondary | ICD-10-CM | POA: Diagnosis not present

## 2015-04-15 DIAGNOSIS — M546 Pain in thoracic spine: Secondary | ICD-10-CM | POA: Diagnosis not present

## 2015-04-15 DIAGNOSIS — M2560 Stiffness of unspecified joint, not elsewhere classified: Secondary | ICD-10-CM

## 2015-04-15 NOTE — Therapy (Signed)
[facility] [address] [place], [place], [address] Phone: [phone]   Fax:  [phone]  Physical Therapy Treatment  Patient Details  Name: [name] MRN: [id number] Date of Birth: [birth year] Referring Provider: Antony Contras, MD  Encounter Date: 04/15/2015      PT End of Session - 04/15/15 1145    Visit Number 13   Number of Visits 18   Date for PT Re-Evaluation 04/25/15   Authorization Type Medicare G-Code & Progress report   PT Start Time 1100   PT Stop Time 1145   PT Time Calculation (min) 45 min   Equipment Utilized During Treatment Gait belt   Activity Tolerance Patient limited by pain;Patient tolerated treatment well   Behavior During Therapy [facility] for tasks assessed/performed      Past Medical History  Diagnosis Date  . Arthritis   . Hypertension   . Migraine   . Colon polyp   . Hearing difficulty   . Torus palatinus   . Atrial flutter ([place]) January, 2012  . Stroke ([place]) 08/02/12     right lenticular nucleus infarct  . Left leg weakness 02/05/2013  . Chronic anticoagulation   . High cholesterol   . Atrial fibrillation ([place])     on Eliquis Rx  . Mild cognitive impairment with memory loss   . Malnutrition ([place])   . TIA (transient ischemic attack)   . Constipation   . Osteoporosis   . Hemorrhoids     Past Surgical History  Procedure Laterality Date  . Cataract extraction w/ intraocular lens  implant, bilateral  2006-2008  . Ganglion cyst excision Bilateral 1938,1954,2003,2005    "wrists/hand" (08/01/2012)  . Cardioversion  05/19/2010    Dr. Einar Gip  . Tonsillectomy  ~ 1935  . Appendectomy  02/19/53    `  . Mouth surgery      [name]    There were no vitals filed for this visit.  Visit Diagnosis:  Weakness generalized  Decreased functional activity tolerance  Decreased transfer ability  Midline thoracic back pain  Abnormality of gait  Stiffness due to immobility       Subjective Assessment - 04/15/15 1103    Subjective She has been using brace to exercise & helps her improve posture. She rode over to PT with brace and pushed into her upper chest some.    Currently in Pain? Yes   Pain Score 6    Pain Location Back   Pain Orientation Upper   Pain Descriptors / Indicators Aching;Sore   Pain Type Chronic pain   Pain Onset More than a month ago   Pain Frequency Constant   Aggravating Factors  prolonged sitting or standing    Pain Relieving Factors rest (lying down) or reclining in Lazy Boy chair   Multiple Pain Sites No                         [facility] Adult PT Treatment/Exercise - 04/15/15 1100    Transfers   Sit to Stand 5: Supervision;With upper extremity assist;From chair/3-in-1;With armrests   Stand to Sit 5: Supervision;With upper extremity assist;With armrests;To chair/3-in-1   Ambulation/Gait   Ambulation/Gait Yes   Ambulation/Gait Assistance 4: Min guard   Ambulation/Gait Assistance Details Verbal & visual cues on step length esp. LLE, posture & position within RW   Ambulation Distance (Feet) 65 Feet  65'X 2   Assistive device Rolling walker  CASH TLSO   Gait Pattern  Step-to pattern;Decreased step length - left;Decreased stance time - right;Decreased stride length;Decreased hip/knee flexion - left;Decreased hip/knee flexion - right;Shuffle;Right flexed knee in stance;Left flexed knee in stance;Lateral hip instability;Trunk flexed   Ambulation Surface Indoor;Level             Balance Exercises - 04/15/15 1152    OTAGO PROGRAM   Hip ABductor 10 reps  walker support okay to side step if keeping LE up to difficu   Ankle Plantorflexors 20 reps, support   Ankle Dorsiflexors 20 reps, support  can modify single leg with partial step forward   Knee Bends 10 reps, support  walker support   Backwards Walking Support  PT instructed 4day/wk counter, 3day/wk RW   Walking and Turning Around Assistive device   Sideways Walking  Assistive device  counter support   Heel Walking Support  4days/wk counter, 3 days/wk RW   Toe Walk Support  4days/wk counter, 3 days/wk RW   Sit to Stand 10 reps, bilateral support  come to full upright posture using  RW           PT Education - 04/15/15 1100    Education provided Yes   Education Details positioning in car & sitting with hips anterior to shoulders but support to low back to increase comfort with TLSO   Person(s) Educated Patient;Child(ren)   Methods Explanation;Demonstration;Verbal cues;Tactile cues   Comprehension Verbalized understanding;Verbal cues required;Tactile cues required          PT Short Term Goals - 03/27/15 1109    PT SHORT TERM GOAL #1   Title Patient and daughters demonstrate understanding of initial HEP. (Target Date: 03/27/2015)   Baseline met on 03/25/15   Status Achieved   PT SHORT TERM GOAL #2   Title Sit to / from stand chairs with armrests to RW with supervision.  (Target Date: 03/27/2015)   Baseline met on 03/27/15   Status Achieved   PT SHORT TERM GOAL #3   Title Patient ambulates 55' with RW with minimal guard.  (Target Date: 03/27/2015)   Baseline met on 03/27/15   Status Achieved           PT Long Term Goals - 04/15/15 1145    PT LONG TERM GOAL #1   Title Patient and daughters demonstrate / verbalize understanding of ongoing HEP.  (Target Date: 04/25/2015)   Time 2   Period Months   Status On-going   PT LONG TERM GOAL #2   Title Patient transfers w/c to mat squat pivot with supervision.  (Target Date: 04/25/2015)   Time 2   Period Months   Status On-going   PT LONG TERM GOAL #3   Title Patient demonstrates standing balance with RW managing pants for toileting with supervision.  (Target Date: 04/25/2015)   Time 2   Period Months   Status On-going   PT LONG TERM GOAL #4   Title Patient ambulates 52' with RW with daughters assist safely.  (Target Date: 04/25/2015)   Time 2   Period Months   Status On-going                Plan - 04/15/15 1145    Clinical Impression Statement Patient and daughter appear to understand how sitting posture can decrease pressure of TLSO on front of chest. Patient and daughter appear to understand [place] program bettter and is a good well rounded program for her.    Pt will benefit from skilled therapeutic intervention in order to improve on the  following deficits Abnormal gait;Decreased activity tolerance;Decreased balance;Decreased endurance;Decreased mobility;Decreased safety awareness;Decreased strength;Postural dysfunction;Pain   Rehab Potential Good   PT Frequency 2x / week   PT Duration Other (comment)  9 weeks (60 days)   PT Treatment/Interventions ADLs/Self Care Home Management;DME Instruction;Gait training;Stair training;Functional mobility training;Therapeutic activities;Therapeutic exercise;Balance training;Neuromuscular re-education;Patient/family education   PT Next Visit Plan Asses TLSO was benefical for outing, Continue [employer], gait training. plan discharge next week   Consulted and Agree with Plan of Care Patient;Family member/caregiver   Family Member Consulted 2 daughters        Problem List Patient Active Problem List   Diagnosis Date Noted  . External hemorrhoids without complication 69/62/9528  . Abdominal pain 12/18/2014  . Nausea without vomiting 12/18/2014  . UTI (lower urinary tract infection) 12/18/2014  . Urinary tract infectious disease   . Chest pain 10/31/2014  . Nausea and vomiting 10/31/2014  . Nausea with vomiting   . Mild cognitive impairment with memory loss 07/25/2014  . Malnourished ([place]) 07/25/2014  . Recurrent UTI 08/07/2013  . History of CVA (cerebrovascular accident) 06/02/2013  . Unspecified constipation 03/16/2013  . Left-sided weakness 03/09/2013  . Possible Seizures 02/14/2013  . TIA (transient ischemic attack) 02/12/2013  . Hyperglycemia 02/12/2013  . High cholesterol   . Atrial fibrillation  ([place]) 02/05/2013  . Chronic anticoagulation 02/05/2013  . HTN (hypertension) 11/14/2012  . Right ureteral stone 09/15/2010  . Atrial flutter ([place]) 07/17/2010  . Osteopenia of the elderly 06/03/2009  . MIGRAINE HEADACHE 02/05/2008  . CATARACTS, BILATERAL 02/05/2008  . DECREASED HEARING 02/05/2008  . GEN OSTEOARTHROSIS INVOLVING MULTIPLE SITES 02/05/2008  . COLONIC POLYPS, HX OF 02/05/2008    Jamey Reas PT, DPT 04/15/2015, 12:14 PM  [facility] [address] [place], [place], [address] Phone: [phone]   Fax:  [phone]  Name: [name] MRN: [id number] Date of Birth: October 07, 1924

## 2015-04-18 ENCOUNTER — Ambulatory Visit: Payer: Medicare Other | Admitting: Physical Therapy

## 2015-04-18 ENCOUNTER — Encounter: Payer: Self-pay | Admitting: Physical Therapy

## 2015-04-18 DIAGNOSIS — Z7409 Other reduced mobility: Secondary | ICD-10-CM | POA: Diagnosis not present

## 2015-04-18 DIAGNOSIS — M546 Pain in thoracic spine: Secondary | ICD-10-CM | POA: Diagnosis not present

## 2015-04-18 DIAGNOSIS — M623 Immobility syndrome (paraplegic): Secondary | ICD-10-CM | POA: Diagnosis not present

## 2015-04-18 DIAGNOSIS — R269 Unspecified abnormalities of gait and mobility: Secondary | ICD-10-CM

## 2015-04-18 DIAGNOSIS — R531 Weakness: Secondary | ICD-10-CM | POA: Diagnosis not present

## 2015-04-18 DIAGNOSIS — R6889 Other general symptoms and signs: Secondary | ICD-10-CM | POA: Diagnosis not present

## 2015-04-18 DIAGNOSIS — M2560 Stiffness of unspecified joint, not elsewhere classified: Secondary | ICD-10-CM

## 2015-04-19 NOTE — Therapy (Signed)
[facility] [address] [place], [place], [address] Phone: [phone]   Fax:  [phone]  Physical Therapy Treatment  Patient Details  Name: [name] MRN: [id number] Date of Birth: 08-15-1924 Referring Provider: Delia Heady, MD  Encounter Date: 04/18/2015   04/18/15 1108  PT Visits / Re-Eval  Visit Number 14  Number of Visits 18  Date for PT Re-Evaluation 04/25/15  Authorization  Authorization Type Medicare G-Code & Progress report  PT Time Calculation  PT Start Time 1103  PT Stop Time 1145  PT Time Calculation (min) 42 min  PT - End of Session  Equipment Utilized During Treatment Gait belt  Activity Tolerance Patient limited by pain;Patient tolerated treatment well  Behavior During Therapy [facility] for tasks assessed/performed      Past Medical History  Diagnosis Date  . Arthritis   . Hypertension   . Migraine   . Colon polyp   . Hearing difficulty   . Torus palatinus   . Atrial flutter ([facility]) January, 2012  . Stroke ([facility]) 08/02/12     right lenticular nucleus infarct  . Left leg weakness 02/05/2013  . Chronic anticoagulation   . High cholesterol   . Atrial fibrillation ([facility])     on Eliquis Rx  . Mild cognitive impairment with memory loss   . Malnutrition ([facility])   . TIA (transient ischemic attack)   . Constipation   . Osteoporosis   . Hemorrhoids     Past Surgical History  Procedure Laterality Date  . Cataract extraction w/ intraocular lens  implant, bilateral  2006-2008  . Ganglion cyst excision Bilateral 1938,1954,2003,2005    "wrists/hand" (08/01/2012)  . Cardioversion  05/19/2010    Dr. Jacinto Halim  . Tonsillectomy  ~ 1935  . Appendectomy  02/19/53    `  . Mouth surgery      [name]    There were no vitals filed for this visit.  Visit Diagnosis:  Weakness generalized  Decreased functional activity tolerance  Decreased transfer ability  Abnormality of gait  Stiffness due to  immobility     04/18/15 1106  Symptoms/Limitations  Subjective Continues to use brace with exercises. No falls or pain to report. Did use the TLSO to go out for breakfast the other day and it worked great per daughter report. Pt had some minor complaints of pressure from brace that they were able to correct with adjustments.  Patient is accompained by: Family member (daughters)  Patient Stated Goals Family would like to build her stamina and improve transfers.   Pain Assessment  Currently in Pain? No/denies  Pain Score 0      04/18/15 0001  Transfers  Sit to Stand 5: Supervision;With upper extremity assist;From chair/3-in-1;With armrests  Stand to Sit 5: Supervision;With upper extremity assist;With armrests;To chair/3-in-1  Ambulation/Gait  Ambulation/Gait Yes  Ambulation/Gait Assistance 4: Min guard;5: Supervision  Ambulation/Gait Assistance Details min verbal and tactile cues for posture and increased left step lenght (toward end of gait trials only).  Ambulation Distance (Feet) 120 Feet (x 2 reps)  Assistive device Rolling walker  Gait Pattern Step-to pattern;Decreased step length - left;Decreased stance time - right;Decreased stride length;Decreased hip/knee flexion - left;Decreased hip/knee flexion - right;Shuffle;Right flexed knee in stance;Left flexed knee in stance;Lateral hip instability;Trunk flexed  Ambulation Surface Level;Indoor  Shoulder Exercises: Seated  Retraction AROM;Strengthening;Both;10 reps;Limitations ("W" performed in sitting, 2 sets performed)  Retraction Limitations cues to maintain midline position and on ex form  Flexion AROM;Strengthening;Both;10 reps;Limitations (performed  in standing)  Flexion Limitations alternating UE raises x 10 each side, then bil simultaneous UE raises x 10 reps, cues on posture and ex form/technique                           Abduction ("angel wings". performed in standing)  ABduction Limitations cues on posture and ex form.            PT Short Term Goals - 03/27/15 1109    PT SHORT TERM GOAL #1   Title Patient and daughters demonstrate understanding of initial HEP. (Target Date: 03/27/2015)   Baseline met on 03/25/15   Status Achieved   PT SHORT TERM GOAL #2   Title Sit to / from stand chairs with armrests to RW with supervision.  (Target Date: 03/27/2015)   Baseline met on 03/27/15   Status Achieved   PT SHORT TERM GOAL #3   Title Patient ambulates 26' with RW with minimal guard.  (Target Date: 03/27/2015)   Baseline met on 03/27/15   Status Achieved           PT Long Term Goals - 04/15/15 1145    PT LONG TERM GOAL #1   Title Patient and daughters demonstrate / verbalize understanding of ongoing HEP.  (Target Date: 04/25/2015)   Time 2   Period Months   Status On-going   PT LONG TERM GOAL #2   Title Patient transfers w/c to mat squat pivot with supervision.  (Target Date: 04/25/2015)   Time 2   Period Months   Status On-going   PT LONG TERM GOAL #3   Title Patient demonstrates standing balance with RW managing pants for toileting with supervision.  (Target Date: 04/25/2015)   Time 2   Period Months   Status On-going   PT LONG TERM GOAL #4   Title Patient ambulates 31' with RW with daughters assist safely.  (Target Date: 04/25/2015)   Time 2   Period Months   Status On-going        04/18/15 1108  Plan  Clinical Impression Statement Pt demo'd good progress with increased activity today and no increase in pain reported. Focued on exercises for postural strengthening and improvement.   Pt will benefit from skilled therapeutic intervention in order to improve on the following deficits Abnormal gait;Decreased activity tolerance;Decreased balance;Decreased endurance;Decreased mobility;Decreased safety awareness;Decreased strength;Postural dysfunction;Pain  Rehab Potential Good  PT Frequency 2x / week  PT Duration Other (comment) (9 weeks (60 days))  PT Treatment/Interventions ADLs/Self  Care Home Management;DME Instruction;Gait training;Stair training;Functional mobility training;Therapeutic activities;Therapeutic exercise;Balance training;Neuromuscular re-education;Patient/family education  PT Next Visit Plan check goals. plan discharge next week  Consulted and Agree with Plan of Care Patient;Family member/caregiver  Family Member Consulted 2 daughters      Problem List Patient Active Problem List   Diagnosis Date Noted  . External hemorrhoids without complication 96/29/5284  . Abdominal pain 12/18/2014  . Nausea without vomiting 12/18/2014  . UTI (lower urinary tract infection) 12/18/2014  . Urinary tract infectious disease   . Chest pain 10/31/2014  . Nausea and vomiting 10/31/2014  . Nausea with vomiting   . Mild cognitive impairment with memory loss 07/25/2014  . Malnourished ([place]) 07/25/2014  . Recurrent UTI 08/07/2013  . History of CVA (cerebrovascular accident) 06/02/2013  . Unspecified constipation 03/16/2013  . Left-sided weakness 03/09/2013  . Possible Seizures 02/14/2013  . TIA (transient ischemic attack) 02/12/2013  . Hyperglycemia 02/12/2013  . High cholesterol   .  Atrial fibrillation ([place]) 02/05/2013  . Chronic anticoagulation 02/05/2013  . HTN (hypertension) 11/14/2012  . Right ureteral stone 09/15/2010  . Atrial flutter ([place]) 07/17/2010  . Osteopenia of the elderly 06/03/2009  . MIGRAINE HEADACHE 02/05/2008  . CATARACTS, BILATERAL 02/05/2008  . DECREASED HEARING 02/05/2008  . GEN OSTEOARTHROSIS INVOLVING MULTIPLE SITES 02/05/2008  . COLONIC POLYPS, HX OF 02/05/2008    Willow Ora 04/19/2015, 8:22 PM  Willow Ora, PTA, [facility] [address] [place], [place] [address] [phone] 04/19/2015, 8:22 PM   Name: [name] MRN: [id number] Date of Birth: Mar 12, 1925

## 2015-04-22 ENCOUNTER — Encounter: Payer: Self-pay | Admitting: Physical Therapy

## 2015-04-22 ENCOUNTER — Ambulatory Visit: Payer: Medicare Other | Admitting: Physical Therapy

## 2015-04-22 DIAGNOSIS — R531 Weakness: Secondary | ICD-10-CM | POA: Diagnosis not present

## 2015-04-22 DIAGNOSIS — R269 Unspecified abnormalities of gait and mobility: Secondary | ICD-10-CM

## 2015-04-22 DIAGNOSIS — Z7409 Other reduced mobility: Secondary | ICD-10-CM | POA: Diagnosis not present

## 2015-04-22 DIAGNOSIS — M546 Pain in thoracic spine: Secondary | ICD-10-CM

## 2015-04-22 DIAGNOSIS — M2560 Stiffness of unspecified joint, not elsewhere classified: Secondary | ICD-10-CM

## 2015-04-22 DIAGNOSIS — M623 Immobility syndrome (paraplegic): Secondary | ICD-10-CM | POA: Diagnosis not present

## 2015-04-22 DIAGNOSIS — R6889 Other general symptoms and signs: Secondary | ICD-10-CM | POA: Diagnosis not present

## 2015-04-23 ENCOUNTER — Other Ambulatory Visit: Payer: Self-pay | Admitting: Internal Medicine

## 2015-04-23 MED FILL — OXYBUTYNIN 5 MG TABLET: 5 | 30 days supply | Qty: 60 | Fill #0

## 2015-04-23 NOTE — Therapy (Signed)
[facility] [address] [place], [place], [address] Phone: [phone]   Fax:  [phone]  Physical Therapy Treatment  Patient Details  Name: [name] MRN: [id number] Date of Birth: Apr 16, 1924 Referring Provider: Antony Contras, MD  Encounter Date: 04/22/2015      PT End of Session - 04/22/15 1108    Visit Number 15   Number of Visits 18   Date for PT Re-Evaluation 04/25/15   Authorization Type Medicare G-Code & Progress report   PT Start Time 1101   PT Stop Time 1140   PT Time Calculation (min) 39 min   Equipment Utilized During Treatment Gait belt   Activity Tolerance Patient limited by pain;Patient tolerated treatment well   Behavior During Therapy [facility] for tasks assessed/performed      Past Medical History  Diagnosis Date  . Arthritis   . Hypertension   . Migraine   . Colon polyp   . Hearing difficulty   . Torus palatinus   . Atrial flutter ([place]) January, 2012  . Stroke ([place]) 08/02/12     right lenticular nucleus infarct  . Left leg weakness 02/05/2013  . Chronic anticoagulation   . High cholesterol   . Atrial fibrillation ([place])     on Eliquis Rx  . Mild cognitive impairment with memory loss   . Malnutrition ([place])   . TIA (transient ischemic attack)   . Constipation   . Osteoporosis   . Hemorrhoids     Past Surgical History  Procedure Laterality Date  . Cataract extraction w/ intraocular lens  implant, bilateral  2006-2008  . Ganglion cyst excision Bilateral 1938,1954,2003,2005    "wrists/hand" (08/01/2012)  . Cardioversion  05/19/2010    Dr. Einar Gip  . Tonsillectomy  ~ 1935  . Appendectomy  02/19/53    `  . Mouth surgery      [name]    There were no vitals filed for this visit.  Visit Diagnosis:  Weakness generalized  Decreased functional activity tolerance  Decreased transfer ability  Abnormality of gait  Midline thoracic back pain  Stiffness due to immobility       Subjective Assessment - 04/22/15 1104    Subjective No new complaints. No falls or pain to report. Using TLSO with exercises and going well. Have not been out to eat with it since last Thursday when went to [place] and [employer] for breakfast.   Patient is accompained by: Family member  daughter   Patient Stated Goals Family would like to build her stamina and improve transfers.    Currently in Pain? Yes   Pain Score 5    Pain Location Back   Pain Orientation Upper   Pain Descriptors / Indicators Aching;Sore   Pain Type Chronic pain   Pain Onset More than a month ago   Pain Frequency Constant   Aggravating Factors  prolonged sitting and standing   Pain Relieving Factors rest (lying down) or reclining in Lazy Boy chair            [facility] Adult PT Treatment/Exercise - 04/22/15 1108    Transfers   Sit to Stand 5: Supervision;With upper extremity assist;From chair/3-in-1;With armrests   Stand to Sit 5: Supervision;With upper extremity assist;With armrests;To chair/3-in-1   Stand Pivot Transfers 5: Supervision   Stand Pivot Transfer Details (indicate cue type and reason) with walker wheelchair<>mat table, no assist needed, minimal cues on sequencing   Ambulation/Gait   Ambulation/Gait Yes   Ambulation/Gait Assistance 4: Min  guard;5: Supervision   Ambulation Distance (Feet) 120 Feet  x1, 62 x1, 55 x1   Assistive device Rolling walker  TLSO brace on   Gait Pattern Step-to pattern;Decreased step length - left;Decreased stance time - right;Decreased stride length;Decreased hip/knee flexion - left;Decreased hip/knee flexion - right;Shuffle;Right flexed knee in stance;Left flexed knee in stance;Lateral hip instability;Trunk flexed   Ambulation Surface Level;Indoor   Knee/Hip Exercises: Standing   Heel Raises Both;1 set;10 reps  walker support   Hip Flexion AROM;Stengthening;Both;1 set;10 reps;Knee bent  with walker: standing marching   Shoulder Exercises: Seated   Retraction  AROM;Strengthening;Both;10 reps;Limitations  standing "W" performed   Retraction Limitations cues to maintain midline position, for posture and on ex form   Flexion AROM;Strengthening;Both;10 reps;Limitations   Flexion Limitations alternating UE raises x 10 each side, then bil simultaneous UE raises x 10 reps, cues on posture and ex form/technique                            Abduction AROM;Strengthening;Both;10 reps;Limitations  'angel wings" performed in standing   ABduction Limitations cues on posture and ex form.            PT Short Term Goals - 03/27/15 1109    PT SHORT TERM GOAL #1   Title Patient and daughters demonstrate understanding of initial HEP. (Target Date: 03/27/2015)   Baseline met on 03/25/15   Status Achieved   PT SHORT TERM GOAL #2   Title Sit to / from stand chairs with armrests to RW with supervision.  (Target Date: 03/27/2015)   Baseline met on 03/27/15   Status Achieved   PT SHORT TERM GOAL #3   Title Patient ambulates 29' with RW with minimal guard.  (Target Date: 03/27/2015)   Baseline met on 03/27/15   Status Achieved           PT Long Term Goals - 04/22/15 1118    PT LONG TERM GOAL #1   Title Patient and daughters demonstrate / verbalize understanding of ongoing HEP.  (Target Date: 04/25/2015)   Baseline 04/22/15: met today.   Status Achieved   PT LONG TERM GOAL #2   Title Patient transfers w/c to mat squat pivot with supervision.  (Target Date: 04/25/2015)   Baseline 04/22/15: met today   Status Achieved   PT LONG TERM GOAL #3   Title Patient demonstrates standing balance with RW managing pants for toileting with supervision.  (Target Date: 04/25/2015)   Time 2   Period Months   Status On-going   PT LONG TERM GOAL #4   Title Patient ambulates 17' with RW with daughters assist safely.  (Target Date: 04/25/2015)   Time 2   Period Months   Status On-going           Plan - 04/22/15 1108    Clinical Impression Statement LTG's 1 and 2 met  today with pt making progress toward remaining goals.    Pt will benefit from skilled therapeutic intervention in order to improve on the following deficits Abnormal gait;Decreased activity tolerance;Decreased balance;Decreased endurance;Decreased mobility;Decreased safety awareness;Decreased strength;Postural dysfunction;Pain   Rehab Potential Good   PT Frequency 2x / week   PT Duration Other (comment)  9 weeks (60 days)   PT Treatment/Interventions ADLs/Self Care Home Management;DME Instruction;Gait training;Stair training;Functional mobility training;Therapeutic activities;Therapeutic exercise;Balance training;Neuromuscular re-education;Patient/family education   PT Next Visit Plan assess remaining LTG's, discharge per PT plan of care   Consulted and Agree with  Plan of Care Patient;Family member/caregiver   Family Member Consulted 2 daughters        Problem List Patient Active Problem List   Diagnosis Date Noted  . External hemorrhoids without complication 35/36/1443  . Abdominal pain 12/18/2014  . Nausea without vomiting 12/18/2014  . UTI (lower urinary tract infection) 12/18/2014  . Urinary tract infectious disease   . Chest pain 10/31/2014  . Nausea and vomiting 10/31/2014  . Nausea with vomiting   . Mild cognitive impairment with memory loss 07/25/2014  . Malnourished ([place]) 07/25/2014  . Recurrent UTI 08/07/2013  . History of CVA (cerebrovascular accident) 06/02/2013  . Unspecified constipation 03/16/2013  . Left-sided weakness 03/09/2013  . Possible Seizures 02/14/2013  . TIA (transient ischemic attack) 02/12/2013  . Hyperglycemia 02/12/2013  . High cholesterol   . Atrial fibrillation ([place]) 02/05/2013  . Chronic anticoagulation 02/05/2013  . HTN (hypertension) 11/14/2012  . Right ureteral stone 09/15/2010  . Atrial flutter ([place]) 07/17/2010  . Osteopenia of the elderly 06/03/2009  . MIGRAINE HEADACHE 02/05/2008  . CATARACTS, BILATERAL 02/05/2008  . DECREASED HEARING  02/05/2008  . GEN OSTEOARTHROSIS INVOLVING MULTIPLE SITES 02/05/2008  . COLONIC POLYPS, HX OF 02/05/2008    Willow Ora 04/23/2015, 12:48 PM  Willow Ora, PTA, [facility] [address], [place], [place] [address] [phone] 04/23/2015, 12:48 PM   Name: VERDELLA LAIDLAW MRN: [id number] Date of Birth: 07-29-1924

## 2015-04-24 ENCOUNTER — Ambulatory Visit: Payer: Medicare Other | Admitting: Physical Therapy

## 2015-04-24 ENCOUNTER — Encounter: Payer: Self-pay | Admitting: Physical Therapy

## 2015-04-24 DIAGNOSIS — M546 Pain in thoracic spine: Secondary | ICD-10-CM | POA: Diagnosis not present

## 2015-04-24 DIAGNOSIS — Z7409 Other reduced mobility: Secondary | ICD-10-CM

## 2015-04-24 DIAGNOSIS — R531 Weakness: Secondary | ICD-10-CM | POA: Diagnosis not present

## 2015-04-24 DIAGNOSIS — M623 Immobility syndrome (paraplegic): Secondary | ICD-10-CM | POA: Diagnosis not present

## 2015-04-24 DIAGNOSIS — R6889 Other general symptoms and signs: Secondary | ICD-10-CM | POA: Diagnosis not present

## 2015-04-24 DIAGNOSIS — R269 Unspecified abnormalities of gait and mobility: Secondary | ICD-10-CM | POA: Diagnosis not present

## 2015-04-24 DIAGNOSIS — M2560 Stiffness of unspecified joint, not elsewhere classified: Secondary | ICD-10-CM

## 2015-04-24 NOTE — Therapy (Addendum)
[facility] [address] [place], [place], [address] Phone: [phone]   Fax:  [phone]  Physical Therapy Treatment  Patient Details  Name: [name] MRN: [id number] Date of Birth: 1925/03/13 Referring Provider: Antony Contras, MD  PHYSICAL THERAPY DISCHARGE SUMMARY  Visits from Start of Care: 16  Current functional level related to goals / functional outcomes: See below   Remaining deficits: See below   Education / Equipment: HEP & activity level Plan: Patient agrees to discharge.  Patient goals were met. Patient is being discharged due to meeting the stated rehab goals.  ?????       Encounter Date: 04/24/2015      PT End of Session - 04/24/15 1150    Visit Number 16   Number of Visits 18   Date for PT Re-Evaluation 04/25/15   Authorization Type Medicare G-Code & Progress report   PT Start Time 1102   PT Stop Time 1146   PT Time Calculation (min) 44 min   Equipment Utilized During Treatment Gait belt   Activity Tolerance Patient limited by pain;Patient tolerated treatment well   Behavior During Therapy [facility] for tasks assessed/performed      Past Medical History  Diagnosis Date  . Arthritis   . Hypertension   . Migraine   . Colon polyp   . Hearing difficulty   . Torus palatinus   . Atrial flutter ([place]) January, 2012  . Stroke ([place]) 08/02/12     right lenticular nucleus infarct  . Left leg weakness 02/05/2013  . Chronic anticoagulation   . High cholesterol   . Atrial fibrillation ([place])     on Eliquis Rx  . Mild cognitive impairment with memory loss   . Malnutrition ([place])   . TIA (transient ischemic attack)   . Constipation   . Osteoporosis   . Hemorrhoids     Past Surgical History  Procedure Laterality Date  . Cataract extraction w/ intraocular lens  implant, bilateral  2006-2008  . Ganglion cyst excision Bilateral 1938,1954,2003,2005    "wrists/hand" (08/01/2012)  . Cardioversion   05/19/2010    Dr. Einar Gip  . Tonsillectomy  ~ 1935  . Appendectomy  02/19/53    `  . Mouth surgery      [name]    There were no vitals filed for this visit.  Visit Diagnosis:  Weakness generalized  Decreased functional activity tolerance  Decreased transfer ability  Abnormality of gait  Midline thoracic back pain  Stiffness due to immobility      Subjective Assessment - 04/24/15 1103    Subjective TLSO did help her ability to go out to eat.    Patient is accompained by: Family member   Currently in Pain? Yes   Pain Score 7    Pain Location Back   Pain Orientation Upper   Pain Descriptors / Indicators Aching;Sore   Pain Type Chronic pain   Pain Onset More than a month ago   Pain Frequency Constant                         [facility] Adult PT Treatment/Exercise - 04/24/15 1100    Transfers   Sit to Stand 5: Supervision;With upper extremity assist;From chair/3-in-1;With armrests   Stand to Sit 5: Supervision;With upper extremity assist;With armrests;To chair/3-in-1   Stand Pivot Transfers 5: Supervision   Ambulation/Gait   Ambulation/Gait Yes   Ambulation/Gait Assistance 4: Min guard;5: Supervision   Ambulation/Gait Assistance Details  daughters verbalize safe assistance    Ambulation Distance (Feet) 120 Feet  95' X 2, 120'   Assistive device Rolling walker  TLSO brace on   Gait Pattern Step-to pattern;Decreased step length - left;Decreased stance time - right;Decreased stride length;Decreased hip/knee flexion - left;Decreased hip/knee flexion - right;Shuffle;Right flexed knee in stance;Left flexed knee in stance;Lateral hip instability;Trunk flexed   Ambulation Surface Indoor;Level   Balance   Balance Assessed Yes   Static Standing Balance   Static Standing - Balance Support Bilateral upper extremity supported  rolling walker   Static Standing - Level of Assistance 5: Stand by assistance   Static Standing - Comment/# of Minutes 2   Dynamic Standing  Balance   Dynamic Standing - Balance Support Right upper extremity supported;Left upper extremity supported;During functional activity  donning coat & managing pants with dtr assist   Dynamic Standing - Level of Assistance 5: Stand by assistance;3: Mod assist  SBA for balance, ModA to manage clothes   Knee/Hip Exercises: Standing   Heel Raises --   Hip Flexion --   Shoulder Exercises: Seated   Retraction --   Retraction Limitations --   Flexion --   Flexion Limitations --   Abduction --   ABduction Limitations --                  PT Short Term Goals - 03/27/15 1109    PT SHORT TERM GOAL #1   Title Patient and daughters demonstrate understanding of initial HEP. (Target Date: 03/27/2015)   Baseline met on 03/25/15   Status Achieved   PT SHORT TERM GOAL #2   Title Sit to / from stand chairs with armrests to RW with supervision.  (Target Date: 03/27/2015)   Baseline met on 03/27/15   Status Achieved   PT SHORT TERM GOAL #3   Title Patient ambulates 10' with RW with minimal guard.  (Target Date: 03/27/2015)   Baseline met on 03/27/15   Status Achieved           PT Long Term Goals - 04/24/15 1109    PT LONG TERM GOAL #1   Title Patient and daughters demonstrate / verbalize understanding of ongoing HEP.  (Target Date: 04/25/2015)   Baseline 04/22/15: met today.   Status Achieved   PT LONG TERM GOAL #2   Title Patient transfers w/c to mat squat pivot with supervision.  (Target Date: 04/25/2015)   Baseline 04/22/15: met today   Status Achieved   PT LONG TERM GOAL #3   Title Patient demonstrates standing balance with RW managing pants for toileting with supervision.  (Target Date: 04/25/2015)   Baseline Partially MET 04/24/2015 Patient is able maintain standing balance for family to assist with pants or coats for ADLs safely.    Time 2   Period Months   Status Partially Met   PT LONG TERM GOAL #4   Title Patient ambulates 69' with RW with daughters assist safely.   (Target Date: 04/25/2015)   Baseline MET 04/24/2015   Time 2   Period Months   Status Achieved               Plan - 04/24/15 1150    Clinical Impression Statement Patient met or partially met all LTGs. Patient and family report significant improvement in her mobility and activity tolerance. Her functional status survey increased from 40 to 47%.    Pt will benefit from skilled therapeutic intervention in order to improve on the following deficits Abnormal gait;Decreased  activity tolerance;Decreased balance;Decreased endurance;Decreased mobility;Decreased safety awareness;Decreased strength;Postural dysfunction;Pain   Rehab Potential Good   PT Frequency 2x / week   PT Duration Other (comment)  9 weeks (60 days)   PT Treatment/Interventions ADLs/Self Care Home Management;DME Instruction;Gait training;Stair training;Functional mobility training;Therapeutic activities;Therapeutic exercise;Balance training;Neuromuscular re-education;Patient/family education   PT Next Visit Plan discharge   Consulted and Agree with Plan of Care Patient;Family member/caregiver   Family Member Consulted 2 daughters          G-Codes - May 04, 2015 1150    Functional Assessment Tool Used Patient ambulated 130' with RW with min guard with TLSO with cues. Back pain 2/10 with gait.    Functional Limitation Mobility: Walking and moving around   Mobility: Walking and Moving Around Goal Status [phone]) At least 60 percent but less than 80 percent impaired, limited or restricted   Mobility: Walking and Moving Around Discharge Status [phone]) At least 60 percent but less than 80 percent impaired, limited or restricted      Problem List Patient Active Problem List   Diagnosis Date Noted  . External hemorrhoids without complication 41/96/2229  . Abdominal pain 12/18/2014  . Nausea without vomiting 12/18/2014  . UTI (lower urinary tract infection) 12/18/2014  . Urinary tract infectious disease   . Chest pain  10/31/2014  . Nausea and vomiting 10/31/2014  . Nausea with vomiting   . Mild cognitive impairment with memory loss 07/25/2014  . Malnourished ([place]) 07/25/2014  . Recurrent UTI 08/07/2013  . History of CVA (cerebrovascular accident) 06/02/2013  . Unspecified constipation 03/16/2013  . Left-sided weakness 03/09/2013  . Possible Seizures 02/14/2013  . TIA (transient ischemic attack) 02/12/2013  . Hyperglycemia 02/12/2013  . High cholesterol   . Atrial fibrillation ([place]) 02/05/2013  . Chronic anticoagulation 02/05/2013  . HTN (hypertension) 11/14/2012  . Right ureteral stone 09/15/2010  . Atrial flutter ([place]) 07/17/2010  . Osteopenia of the elderly 06/03/2009  . MIGRAINE HEADACHE 02/05/2008  . CATARACTS, BILATERAL 02/05/2008  . DECREASED HEARING 02/05/2008  . GEN OSTEOARTHROSIS INVOLVING MULTIPLE SITES 02/05/2008  . COLONIC POLYPS, HX OF 02/05/2008    Jamey Reas PT, DPT 04-May-2015, 9:04 PM  [facility] [address] [place], [place], [address] Phone: [phone]   Fax:  [phone]  Name: [name] MRN: [id number] Date of Birth: 1925-01-12

## 2015-05-12 MED FILL — ELIQUIS 2.5 MG TABLET: 2.5 | 90 days supply | Qty: 180 | Fill #1

## 2015-05-27 MED FILL — OXYBUTYNIN 5 MG TABLET: 5 | 30 days supply | Qty: 60 | Fill #1

## 2015-05-28 ENCOUNTER — Emergency Department (HOSPITAL_COMMUNITY): Payer: Medicare Other

## 2015-05-28 ENCOUNTER — Encounter (HOSPITAL_COMMUNITY): Payer: Self-pay | Admitting: Emergency Medicine

## 2015-05-28 ENCOUNTER — Emergency Department (HOSPITAL_COMMUNITY)
Admission: EM | Admit: 2015-05-28 | Discharge: 2015-05-28 | Disposition: A | Discharge status: Home / Self Care | Payer: Medicare Other | Attending: Physician Assistant | Admitting: Physician Assistant

## 2015-05-28 DIAGNOSIS — M1990 Unspecified osteoarthritis, unspecified site: Secondary | ICD-10-CM | POA: Diagnosis not present

## 2015-05-28 DIAGNOSIS — I10 Essential (primary) hypertension: Secondary | ICD-10-CM | POA: Diagnosis not present

## 2015-05-28 DIAGNOSIS — M542 Cervicalgia: Secondary | ICD-10-CM | POA: Diagnosis not present

## 2015-05-28 DIAGNOSIS — E7800 Pure hypercholesterolemia, unspecified: Secondary | ICD-10-CM | POA: Insufficient documentation

## 2015-05-28 DIAGNOSIS — N390 Urinary tract infection, site not specified: Secondary | ICD-10-CM | POA: Diagnosis not present

## 2015-05-28 DIAGNOSIS — R42 Dizziness and giddiness: Secondary | ICD-10-CM | POA: Diagnosis not present

## 2015-05-28 DIAGNOSIS — Z79899 Other long term (current) drug therapy: Secondary | ICD-10-CM | POA: Diagnosis not present

## 2015-05-28 DIAGNOSIS — Z86010 Personal history of colonic polyps: Secondary | ICD-10-CM | POA: Insufficient documentation

## 2015-05-28 DIAGNOSIS — G43909 Migraine, unspecified, not intractable, without status migrainosus: Secondary | ICD-10-CM | POA: Diagnosis not present

## 2015-05-28 DIAGNOSIS — R300 Dysuria: Secondary | ICD-10-CM | POA: Diagnosis present

## 2015-05-28 DIAGNOSIS — Z043 Encounter for examination and observation following other accident: Secondary | ICD-10-CM | POA: Insufficient documentation

## 2015-05-28 DIAGNOSIS — R079 Chest pain, unspecified: Secondary | ICD-10-CM | POA: Diagnosis not present

## 2015-05-28 DIAGNOSIS — T148 Other injury of unspecified body region: Secondary | ICD-10-CM | POA: Diagnosis not present

## 2015-05-28 DIAGNOSIS — Z8673 Personal history of transient ischemic attack (TIA), and cerebral infarction without residual deficits: Secondary | ICD-10-CM | POA: Insufficient documentation

## 2015-05-28 DIAGNOSIS — W19XXXA Unspecified fall, initial encounter: Secondary | ICD-10-CM

## 2015-05-28 DIAGNOSIS — S064X0A Epidural hemorrhage without loss of consciousness, initial encounter: Secondary | ICD-10-CM | POA: Diagnosis not present

## 2015-05-28 DIAGNOSIS — Z87891 Personal history of nicotine dependence: Secondary | ICD-10-CM | POA: Insufficient documentation

## 2015-05-28 DIAGNOSIS — M25552 Pain in left hip: Secondary | ICD-10-CM | POA: Diagnosis not present

## 2015-05-28 LAB — URINALYSIS, ROUTINE W REFLEX MICROSCOPIC
Bilirubin Urine: NEGATIVE
Glucose, UA: NEGATIVE mg/dL
Hgb urine dipstick: NEGATIVE
KETONES UR: NEGATIVE mg/dL
NITRITE: NEGATIVE
PH: 7 (ref 5.0–8.0)
Protein, ur: NEGATIVE mg/dL
SPECIFIC GRAVITY, URINE: 1.012 (ref 1.005–1.030)

## 2015-05-28 LAB — PROTIME-INR
INR: 1.07 (ref 0.00–1.49)
PROTHROMBIN TIME: 14.1 s (ref 11.6–15.2)

## 2015-05-28 LAB — CBC WITH DIFFERENTIAL/PLATELET
BASOS ABS: 0 10*3/uL (ref 0.0–0.1)
BASOS PCT: 1 %
Eosinophils Absolute: 0.1 10*3/uL (ref 0.0–0.7)
Eosinophils Relative: 2 %
HEMATOCRIT: 39.5 % (ref 36.0–46.0)
Hemoglobin: 12.8 g/dL (ref 12.0–15.0)
LYMPHS PCT: 29 %
Lymphs Abs: 1.2 10*3/uL (ref 0.7–4.0)
MCH: 30 pg (ref 26.0–34.0)
MCHC: 32.4 g/dL (ref 30.0–36.0)
MCV: 92.7 fL (ref 78.0–100.0)
Monocytes Absolute: 0.7 10*3/uL (ref 0.1–1.0)
Monocytes Relative: 17 %
NEUTROS ABS: 2.3 10*3/uL (ref 1.7–7.7)
NEUTROS PCT: 53 %
Platelets: 141 10*3/uL — ABNORMAL LOW (ref 150–400)
RBC: 4.26 MIL/uL (ref 3.87–5.11)
RDW: 14.6 % (ref 11.5–15.5)
WBC: 4.3 10*3/uL (ref 4.0–10.5)

## 2015-05-28 LAB — URINE MICROSCOPIC-ADD ON

## 2015-05-28 LAB — COMPREHENSIVE METABOLIC PANEL
ALK PHOS: 46 U/L (ref 38–126)
ALT: 10 U/L — ABNORMAL LOW (ref 14–54)
ANION GAP: 12 (ref 5–15)
AST: 22 U/L (ref 15–41)
Albumin: 3.4 g/dL — ABNORMAL LOW (ref 3.5–5.0)
BILIRUBIN TOTAL: 0.6 mg/dL (ref 0.3–1.2)
BUN: 13 mg/dL (ref 6–20)
CALCIUM: 9.1 mg/dL (ref 8.9–10.3)
CO2: 27 mmol/L (ref 22–32)
Chloride: 106 mmol/L (ref 101–111)
Creatinine, Ser: 0.94 mg/dL (ref 0.44–1.00)
GFR calc non Af Amer: 53 mL/min — ABNORMAL LOW (ref >60)
Glucose, Bld: 94 mg/dL (ref 65–99)
POTASSIUM: 4 mmol/L (ref 3.5–5.1)
SODIUM: 145 mmol/L (ref 135–145)
TOTAL PROTEIN: 5.8 g/dL — AB (ref 6.5–8.1)

## 2015-05-28 MED ORDER — CEPHALEXIN 500 MG PO CAPS: 500.0000 mg | ORAL_CAPSULE | Freq: Two times a day (BID) | ORAL | Status: DC

## 2015-05-28 MED ORDER — CEPHALEXIN 250 MG PO CAPS
500.0000 mg | ORAL_CAPSULE | Freq: Once | ORAL | Status: AC
  Administered 2015-05-28: 500 mg via ORAL
  Filled 2015-05-28: qty 2

## 2015-05-28 MED FILL — CEPHALEXIN 500 MG CAPSULE: 500 | 7 days supply | Qty: 14 | Fill #0

## 2015-05-28 NOTE — ED Notes (Signed)
Per EMS: pt from home with caregivers c/o unwitnessed fall this am while trying to get out of bed; pt hit back of head; pt on blood thinners; no LOC; 20g L AC; CBG 123

## 2015-05-28 NOTE — ED Notes (Signed)
Pt home with family after family verbalizes understanding of instructions.

## 2015-05-28 NOTE — ED Provider Notes (Addendum)
CSN: [id number]     Arrival date & time 05/28/15  0728 History   First MD Initiated Contact with Patient 05/28/15 0732     Chief Complaint  Patient presents with  . Fall     (Consider location/radiation/quality/duration/timing/severity/associated sxs/prior Treatment) HPI   82 yo ho HTN, dementia, left hemiparesis presenting after fal this am at 6:30 am.   Pt ambulates with walker at baseline. Tried to get out of bed by herself.  She struck her left head and left hip.  No LOC.   Pt is on eloquis.  No new weakness, numbness or neurologic changes.   Pt has urge incontinence at baseline.     Past Medical History  Diagnosis Date  . Arthritis   . Hypertension   . Migraine   . Colon polyp   . Hearing difficulty   . Torus palatinus   . Atrial flutter ([place]) January, 2012  . Stroke ([place]) 08/02/12     right lenticular nucleus infarct  . Left leg weakness 02/05/2013  . Chronic anticoagulation   . High cholesterol   . Atrial fibrillation ([place])     on Eliquis Rx  . Mild cognitive impairment with memory loss   . Malnutrition ([place])   . TIA (transient ischemic attack)   . Constipation   . Osteoporosis   . Hemorrhoids    Past Surgical History  Procedure Laterality Date  . Cataract extraction w/ intraocular lens  implant, bilateral  2006-2008  . Ganglion cyst excision Bilateral 1938,1954,2003,2005    "wrists/hand" (08/01/2012)  . Cardioversion  05/19/2010    Dr. Einar Gip  . Tonsillectomy  ~ 1935  . Appendectomy  02/19/53    `  . Mouth surgery      [name]   Family History  Problem Relation Age of Onset  . Colon cancer Father   . Breast cancer    . Brain cancer    . Cancer Mother     breast  . Aneurysm Mother     brain  . Heart attack Neg Hx   . Diabetes Neg Hx   . Hypertension Neg Hx   . Stroke Mother    Social History  Substance Use Topics  . Smoking status: Former Research scientist (life sciences)  . Smokeless tobacco: Never Used  . Alcohol Use: No     Comment: 08/01/2012 "glass of wine on special  occasions"   OB History    No data available     Review of Systems  Constitutional: Negative for fever.  Respiratory: Negative for shortness of breath.   Cardiovascular: Negative for chest pain.  Gastrointestinal: Negative for vomiting, abdominal pain and diarrhea.  Genitourinary: Positive for dysuria, urgency and frequency. Negative for difficulty urinating.  Musculoskeletal: Negative for gait problem.  Neurological: Negative for weakness and numbness.  Psychiatric/Behavioral: Negative for behavioral problems.  All other systems reviewed and are negative.     Allergies  Aricept; Tramadol hcl; and Alendronate sodium  Home Medications   Prior to Admission medications   Medication Sig Start Date End Date Taking? Authorizing Provider  acetaminophen (TYLENOL) 500 MG tablet Take 1,000 mg by mouth as needed for mild pain or headache.     Historical Provider, MD  b complex vitamins tablet Take 1 tablet by mouth 2 (two) times daily.     Historical Provider, MD  Calcium Carb-Cholecalciferol (CALCIUM 600 + D) 600-200 MG-UNIT TABS Take 1 tablet by mouth 2 (two) times daily.    Historical Provider, MD  Cholecalciferol (VITAMIN D) 2000 UNITS  tablet Take 2,000 Units by mouth daily.    Historical Provider, MD  diphenhydramine-acetaminophen (TYLENOL PM) 25-500 MG TABS Take 2 tablets by mouth at bedtime as needed (sleep).     Historical Provider, MD  divalproex (DEPAKOTE) 125 MG DR tablet Take 2 tablets (250 mg total) by mouth 2 (two) times daily. 07/25/14   Garvin Fila, MD  Docusate Sodium (COLACE PO) Take 100 mg by mouth 2 (two) times daily.     Historical Provider, MD  ELIQUIS 2.5 MG TABS tablet TAKE 1 TABLET BY MOUTH TWICE DAILY 03/25/15   Garvin Fila, MD  hydrocortisone (ANUSOL-HC) 2.5 % rectal cream Place 1 application rectally as needed for hemorrhoids or itching.    Historical Provider, MD  hydrocortisone (ANUSOL-HC) 25 MG suppository Place 25 mg rectally as needed for hemorrhoids or  itching.    Historical Provider, MD  Melatonin 3 MG TABS Take 1 tablet by mouth at bedtime    Historical Provider, MD  Multiple Vitamin (MULTIVITAMIN WITH MINERALS) TABS Take 1 tablet by mouth daily.    Historical Provider, MD  ondansetron (ZOFRAN) 4 MG tablet Take 1 tablet (4 mg total) by mouth every 6 (six) hours as needed for nausea. 10/31/14   Delfina Redwood, MD  oxybutynin (DITROPAN) 5 MG tablet TAKE 1 TABLET BY MOUTH 2 TIMES DAILY. 04/23/15   Monica Carter, DO   BP 166/68 mmHg  Pulse 63  Temp(Src) 98.1 F (36.7 C) (Oral)  Resp 13  SpO2 96% Physical Exam  Constitutional: She appears well-developed and well-nourished.  HENT:  Head: Normocephalic and atraumatic.  Eyes: Conjunctivae are normal. Right eye exhibits no discharge.  Neck: Neck supple.  Cardiovascular: Normal heart sounds.   No murmur heard. regular  Pulmonary/Chest: Effort normal and breath sounds normal. She has no wheezes. She has no rales.  Abdominal: Soft. She exhibits no distension. There is no tenderness.  Musculoskeletal: Normal range of motion. She exhibits no edema.  Neurological: No cranial nerve deficit.  Alert X self, place  Mild left sided weakness  Skin: Skin is warm and dry. No rash noted. She is not diaphoretic.  No evidence of echymosis or evidence of external trauma.   Psychiatric: She has a normal mood and affect.  Nursing note and vitals reviewed.   ED Course  Procedures (including critical care time) Labs Review Labs Reviewed  COMPREHENSIVE METABOLIC PANEL - Abnormal; Notable for the following:    Total Protein 5.8 (*)    Albumin 3.4 (*)    ALT 10 (*)    GFR calc non Af Amer 53 (*)    All other components within normal limits  CBC WITH DIFFERENTIAL/PLATELET - Abnormal; Notable for the following:    Platelets 141 (*)    All other components within normal limits  URINALYSIS, ROUTINE W REFLEX MICROSCOPIC (NOT AT [facility]) - Abnormal; Notable for the following:    APPearance CLOUDY (*)     Leukocytes, UA LARGE (*)    All other components within normal limits  URINE MICROSCOPIC-ADD ON - Abnormal; Notable for the following:    Squamous Epithelial / LPF 6-30 (*)    Bacteria, UA RARE (*)    All other components within normal limits  URINE CULTURE  PROTIME-INR    Imaging Review Dg Chest 1 View  05/28/2015  CLINICAL DATA:  Chest pain, left hip pain status post fall EXAM: CHEST 1 VIEW COMPARISON:  01/01/2015 FINDINGS: Lungs are essentially clear.  No pleural effusion or pneumothorax. Cardiomegaly. Post-traumatic deformity  involving the lateral/distal left clavicle. IMPRESSION: No evidence of acute cardiopulmonary disease. Electronically Signed   By: Julian Hy M.D.   On: 05/28/2015 09:17   Ct Head Wo Contrast  05/28/2015  CLINICAL DATA:  Pain following fall.  Gait disturbance.  Dizziness. EXAM: CT HEAD WITHOUT CONTRAST CT CERVICAL SPINE WITHOUT CONTRAST TECHNIQUE: Multidetector CT imaging of the head and cervical spine was performed following the standard protocol without intravenous contrast. Multiplanar CT image reconstructions of the cervical spine were also generated. COMPARISON:  Head CT and brain MRI December 18, 2014 FINDINGS: CT HEAD FINDINGS Moderate diffuse atrophy is stable. There is no intracranial mass, hemorrhage, extra-axial fluid collection, or midline shift. There is extensive small vessel disease throughout the centra semiovale bilaterally. A prior small infarct extends into the right frontal lobe gray-white junction in the mid right frontal region. There is a prior stable infarct in the right inferior centrum semiovale. There is evidence of a prior infarct in the mid portion of the left occipital lobe. There is evidence of a prior small infarct in the mid posterior right cerebellum. There is no new gray-white compartment lesion. No acute infarct evident. The bony calvarium appears intact. The mastoid air cells are clear. There are no intraorbital lesions. CT CERVICAL  SPINE FINDINGS There is no demonstrable acute fracture. There is slight retrolisthesis of C3 on C4. There is no other spondylolisthesis. Prevertebral soft tissues and predental space regions are normal. There is marked disc space narrowing at C3-4, C6-7, and C7-T1. There is moderately severe disc space narrowing at C4-5 and C5-6. There is facet hypertrophy at most levels bilaterally. There is exit foraminal narrowing due to bony hypertrophy at C3-4 bilaterally, more severe on the right than on the left, at C4-5 on the left, at C5-6 bilaterally, and at C6-7 on the left. There is no frank disc extrusion or stenosis. IMPRESSION: CT head: Atrophy with extensive periventricular small vessel disease. Prior infarct at the gray - white junction of the mid right frontal lobe. Prior left midportion occipital lobe infarct. Stable right inferior centrum semiovale infarct. Prior small infarct posterior mid right cerebellum. No acute infarct evident. No intracranial mass, hemorrhage, or extra-axial fluid collection. CT cervical spine: No acute fracture evident. Slight spondylolisthesis at C3-4 is felt to be due to underlying spondylosis. No other spondylolisthesis. Multilevel arthropathy. Electronically Signed   By: Lowella Grip III M.D.   On: 05/28/2015 09:04   Ct Cervical Spine Wo Contrast  05/28/2015  CLINICAL DATA:  Pain following fall.  Gait disturbance.  Dizziness. EXAM: CT HEAD WITHOUT CONTRAST CT CERVICAL SPINE WITHOUT CONTRAST TECHNIQUE: Multidetector CT imaging of the head and cervical spine was performed following the standard protocol without intravenous contrast. Multiplanar CT image reconstructions of the cervical spine were also generated. COMPARISON:  Head CT and brain MRI December 18, 2014 FINDINGS: CT HEAD FINDINGS Moderate diffuse atrophy is stable. There is no intracranial mass, hemorrhage, extra-axial fluid collection, or midline shift. There is extensive small vessel disease throughout the centra  semiovale bilaterally. A prior small infarct extends into the right frontal lobe gray-white junction in the mid right frontal region. There is a prior stable infarct in the right inferior centrum semiovale. There is evidence of a prior infarct in the mid portion of the left occipital lobe. There is evidence of a prior small infarct in the mid posterior right cerebellum. There is no new gray-white compartment lesion. No acute infarct evident. The bony calvarium appears intact. The mastoid air cells are  clear. There are no intraorbital lesions. CT CERVICAL SPINE FINDINGS There is no demonstrable acute fracture. There is slight retrolisthesis of C3 on C4. There is no other spondylolisthesis. Prevertebral soft tissues and predental space regions are normal. There is marked disc space narrowing at C3-4, C6-7, and C7-T1. There is moderately severe disc space narrowing at C4-5 and C5-6. There is facet hypertrophy at most levels bilaterally. There is exit foraminal narrowing due to bony hypertrophy at C3-4 bilaterally, more severe on the right than on the left, at C4-5 on the left, at C5-6 bilaterally, and at C6-7 on the left. There is no frank disc extrusion or stenosis. IMPRESSION: CT head: Atrophy with extensive periventricular small vessel disease. Prior infarct at the gray - white junction of the mid right frontal lobe. Prior left midportion occipital lobe infarct. Stable right inferior centrum semiovale infarct. Prior small infarct posterior mid right cerebellum. No acute infarct evident. No intracranial mass, hemorrhage, or extra-axial fluid collection. CT cervical spine: No acute fracture evident. Slight spondylolisthesis at C3-4 is felt to be due to underlying spondylosis. No other spondylolisthesis. Multilevel arthropathy. Electronically Signed   By: Lowella Grip III M.D.   On: 05/28/2015 09:04   Dg Hip Unilat W Or W/o Pelvis 2-3 Views Left  05/28/2015  CLINICAL DATA:  Chest pain, left hip pain from fall.  EXAM: DG HIP (WITH OR WITHOUT PELVIS) 2-3V LEFT COMPARISON:  None. FINDINGS: Mild symmetric degenerative changes in the hips bilaterally. No acute bony abnormality. Specifically, no fracture, subluxation, or dislocation. Soft tissues are intact. IMPRESSION: No acute bony abnormality. Electronically Signed   By: Rolm Baptise M.D.   On: 05/28/2015 09:13   I have personally reviewed and evaluated these images and lab results as part of my medical decision-making.   EKG Interpretation   Date/Time:  Wednesday May 28 2015 07:35:16 EST Ventricular Rate:  78 PR Interval:    QRS Duration: 98 QT Interval:  414 QTC Calculation: 472 R Axis:   39 Text Interpretation:  Atrial flutter Ventricular premature complex  Anteroseptal infarct, old Baseline wander in lead(s) II III aVF V3 V6 No  significant change since last tracing aflutter Confirmed by Gerald Leitz ([id number]) on 05/28/2015 7:42:59 AM      MDM   Final diagnoses:  Chest pain   Patient is an 82 year old female presenting with fall on a liquids. Patient had a mechanical fall getting out of bed. Patient struck her head and left hip. No external evidence of trauma. Patient's family notes increased urgent frequency of urination. We'll do workup for occult infection causing weakness, and trauma workup given patient's age.   9:33 AM Patient has had UTI. CAT scans are all within normal reasons. Patient is alert oriented taking by mouth at the time of discharge. We'll discharge into the care of patient's family.  Brydon Spahr Julio Alm, MD 05/28/15 [id number]  Vesta Wheeland Julio Alm, MD 05/28/15 [id number]

## 2015-05-29 LAB — URINE CULTURE

## 2015-06-11 ENCOUNTER — Encounter: Payer: Self-pay | Admitting: Internal Medicine

## 2015-06-11 ENCOUNTER — Ambulatory Visit (INDEPENDENT_AMBULATORY_CARE_PROVIDER_SITE_OTHER): Payer: Medicare Other | Admitting: Internal Medicine

## 2015-06-11 VITALS — BP 112/70 | HR 90 | Temp 97.4°F | Resp 20 | Ht 66.0 in | Wt 133.4 lb

## 2015-06-11 DIAGNOSIS — Z8673 Personal history of transient ischemic attack (TIA), and cerebral infarction without residual deficits: Secondary | ICD-10-CM | POA: Diagnosis not present

## 2015-06-11 DIAGNOSIS — N3941 Urge incontinence: Secondary | ICD-10-CM

## 2015-06-11 DIAGNOSIS — G3184 Mild cognitive impairment, so stated: Secondary | ICD-10-CM

## 2015-06-11 DIAGNOSIS — I10 Essential (primary) hypertension: Secondary | ICD-10-CM | POA: Diagnosis not present

## 2015-06-11 DIAGNOSIS — H1011 Acute atopic conjunctivitis, right eye: Secondary | ICD-10-CM

## 2015-06-11 DIAGNOSIS — K5900 Constipation, unspecified: Secondary | ICD-10-CM

## 2015-06-11 DIAGNOSIS — E46 Unspecified protein-calorie malnutrition: Secondary | ICD-10-CM | POA: Diagnosis not present

## 2015-06-11 DIAGNOSIS — K644 Residual hemorrhoidal skin tags: Secondary | ICD-10-CM

## 2015-06-11 DIAGNOSIS — E785 Hyperlipidemia, unspecified: Secondary | ICD-10-CM

## 2015-06-11 DIAGNOSIS — J301 Allergic rhinitis due to pollen: Secondary | ICD-10-CM

## 2015-06-11 DIAGNOSIS — K648 Other hemorrhoids: Secondary | ICD-10-CM

## 2015-06-11 MED ORDER — OLOPATADINE HCL 0.1 % OP SOLN: 1.0000 [drp] | Freq: Two times a day (BID) | OPHTHALMIC | Status: DC

## 2015-06-11 MED FILL — OLOPATADINE HCL 0.1% EYE DR: 0.1 | 30 days supply | Qty: 5 | Fill #0

## 2015-06-11 NOTE — Patient Instructions (Addendum)
Recommend OTC plain zyrtec daily for seasonal allergy  Use eye drops as needed  Continue other medications as ordered  Follow up with neurology as scheduled  Follow up in 4 mos for routine visit

## 2015-06-11 NOTE — Progress Notes (Signed)
Patient ID: [name], female   DOB: May 08, 1924, 82 y.o.   MRN: [id number]    Location:    [facility]   Place of Service:  OFFICE   Chief Complaint  Patient presents with  . Medical Management of Chronic Issues    4 month follow-up for routine visit, Patients c/o unrinating to much  . OTHER    Patient fall risk  . OTHER    both daughters in room with patient    HPI:  82 yo female seen today for f/u. She c/o increased urinary frequency/urgency. No urine leakage. No nocturia. She takes oxybutynin which helps night sx's  She was seen in the ED for fall where she struck her head. It occurred after getting up to go the bathroom without assistance.  Imaging of head neg. UA neg for UTI and urine cx was contaminated but ED tx her for UTI with keflex x 7 days. Albumin 3.4  Afib/HTN - Dr Einar Gip. Takes eliquis. Rate controlled off med. She has hx frequent hypotension.  Hx CVA with left hemiparesis/TIAs - Dr Leonie Man. Takes depakote for sz prophylaxis and migraine.  Memory loss - sees neurology. Has tried aricept but it caused GI upset after 2 doses.. She has been having intermittent auditory hallucinations (music -"silent night", voices) x 1.5 mos. She lives with her 2 daughters  HA - She takes depakote 2 tabs BID. She was taking 3 BID until April 2016 when dose reduced due to no HA/sz x several yrs. Followed by neurology  Arthritis - on tylenol prn.  Osteoporosis - has taken prolia in the past. Last dose 11/27/13. Currently on Vit D and calcium  Hemorrhoids - no bleeding. She takes stool softener and yogurt daily. Increased bran in diet. She c/o constipation and is obessessed with having BM daily per daugters   Past Medical History  Diagnosis Date  . Arthritis   . Hypertension   . Migraine   . Colon polyp   . Hearing difficulty   . Torus palatinus   . Atrial flutter ([place]) January, 2012  . Stroke ([place]) 08/02/12     right lenticular nucleus infarct  . Left leg weakness 02/05/2013  . Chronic  anticoagulation   . High cholesterol   . Atrial fibrillation ([place])     on Eliquis Rx  . Mild cognitive impairment with memory loss   . Malnutrition ([place])   . TIA (transient ischemic attack)   . Constipation   . Osteoporosis   . Hemorrhoids     Past Surgical History  Procedure Laterality Date  . Cataract extraction w/ intraocular lens  implant, bilateral  2006-2008  . Ganglion cyst excision Bilateral 1938,1954,2003,2005    "wrists/hand" (08/01/2012)  . Cardioversion  05/19/2010    Dr. Einar Gip  . Tonsillectomy  ~ 1935  . Appendectomy  02/19/53    `  . Mouth surgery      [name]    Patient Care Team: Gildardo Cranker, DO as PCP - General (Internal Medicine) Gildardo Cranker, DO as Consulting Physician (Internal Medicine)  Social History   Social History  . Marital Status: Widowed    Spouse Name: N/A  . Number of Children: 3  . Years of Education: college   Occupational History  . editorial work for [employer]    Social History Main Topics  . Smoking status: Former Research scientist (life sciences)  . Smokeless tobacco: Never Used  . Alcohol Use: No     Comment: 08/01/2012 "glass of wine on special occasions"  .  Drug Use: No  . Sexual Activity: No   Other Topics Concern  . Not on file   Social History Narrative   Patient lives at home with daughters.   Caffeine use: 1/2-1 cup of coffee occasionally         Diet: Regular      Do you drink/ eat things with caffeine? Yes      Marital status:   Widowed                            What year were you married ?       Do you live in a house, apartment,assistred living, condo, trailer, etc.)? House      Is it one or more stories? Yes      How many persons live in your home ? 4      Do you have any pets in your home ?(please list) Yes/ 1 Dog, 2 Cats      Current or past profession: Editor      Do you exercise? No                             Type & how often:      Do you have a living will? Yes      Do you have a DNR form?  Yes                      If not, do you want to discuss one?       Do you have signed POA?HPOA forms? Yes                If so, please bring to your        appointment      regular     reports that she has quit smoking. She has never used smokeless tobacco. She reports that she does not drink alcohol or use illicit drugs.  Allergies  Allergen Reactions  . Aricept [Donepezil Hcl] Hypertension    With N/V  . Tramadol Hcl Other (See Comments)     lethargy, nausea  . Alendronate Sodium Rash and Other (See Comments)    puffy eyes    Medications: Patient's Medications  New Prescriptions   No medications on file  Previous Medications   ACETAMINOPHEN (TYLENOL) 500 MG TABLET    Take 1,000 mg by mouth as needed for mild pain or headache.    B COMPLEX VITAMINS TABLET    Take 1 tablet by mouth 2 (two) times daily.    CALCIUM CARB-CHOLECALCIFEROL (CALCIUM 600 + D) 600-200 MG-UNIT TABS    Take 1 tablet by mouth 2 (two) times daily.   CHOLECALCIFEROL (VITAMIN D) 2000 UNITS TABLET    Take 2,000 Units by mouth daily.   DIPHENHYDRAMINE-ACETAMINOPHEN (TYLENOL PM) 25-500 MG TABS    Take 2 tablets by mouth at bedtime as needed (sleep).    DIVALPROEX (DEPAKOTE) 125 MG DR TABLET    Take 2 tablets (250 mg total) by mouth 2 (two) times daily.   DOCUSATE SODIUM (COLACE PO)    Take 100 mg by mouth 2 (two) times daily.    ELIQUIS 2.5 MG TABS TABLET    TAKE 1 TABLET BY MOUTH TWICE DAILY   HYDROCORTISONE (ANUSOL-HC) 2.5 % RECTAL CREAM    Place 1 application rectally as needed for hemorrhoids or itching.   HYDROCORTISONE (ANUSOL-HC)  25 MG SUPPOSITORY    Place 25 mg rectally as needed for hemorrhoids or itching.   MELATONIN 3 MG TABS    Take 1 tablet by mouth at bedtime   MULTIPLE VITAMIN (MULTIVITAMIN WITH MINERALS) TABS    Take 1 tablet by mouth daily.   ONDANSETRON (ZOFRAN) 4 MG TABLET    Take 1 tablet (4 mg total) by mouth every 6 (six) hours as needed for nausea.   OXYBUTYNIN (DITROPAN) 5 MG TABLET    TAKE 1 TABLET BY MOUTH 2  TIMES DAILY.  Modified Medications   No medications on file  Discontinued Medications   CEPHALEXIN (KEFLEX) 500 MG CAPSULE    Take 1 capsule (500 mg total) by mouth 2 (two) times daily.    Review of Systems  Unable to perform ROS: Dementia    Filed Vitals:   06/11/15 1053  BP: 112/70  Pulse: 90  Temp: 97.4 F (36.3 C)  TempSrc: Oral  Resp: 20  Height: _0  (1.676 m)  Weight: 133 lb 6.4 oz (60.51 kg)  SpO2: 96%   Body mass index is 21.54 kg/(m^2).  Physical Exam  Constitutional: She appears well-developed.  Frail appearing in NAD  HENT:  Mouth/Throat: Oropharynx is clear and moist. No oropharyngeal exudate.  Eyes: Pupils are equal, round, and reactive to light. Right eye exhibits no discharge. No scleral icterus.  R>L lower internal eyelid conjunctival cobblestoning appearance with redness but no d/c  Neck: Neck supple. Carotid bruit is not present. No tracheal deviation present. No thyromegaly present.  Cardiovascular: Normal rate, regular rhythm and intact distal pulses.  Exam reveals no gallop and no friction rub.   Murmur (1/6 SEM) heard. No LE edema b/l. no calf TTP.   Pulmonary/Chest: Effort normal and breath sounds normal. No stridor. No respiratory distress. She has no wheezes. She has no rales.  Abdominal: Soft. Bowel sounds are normal. She exhibits no distension and no mass. There is no hepatomegaly. There is no tenderness. There is no rebound and no guarding.  Musculoskeletal: She exhibits edema.  Lymphadenopathy:    She has no cervical adenopathy.  Neurological: She is alert.  Skin: Skin is warm and dry. No rash noted.  Psychiatric: She has a normal mood and affect. Her behavior is normal.     Labs reviewed: Admission on 05/28/2015, Discharged on 05/28/2015  Component Date Value Ref Range Status  . Prothrombin Time 05/28/2015 14.1  11.6 - 15.2 seconds Final  . INR 05/28/2015 1.07  0.00 - 1.49 Final  . Sodium 05/28/2015 145  135 - 145 mmol/L Final  .  Potassium 05/28/2015 4.0  3.5 - 5.1 mmol/L Final  . Chloride 05/28/2015 106  101 - 111 mmol/L Final  . CO2 05/28/2015 27  22 - 32 mmol/L Final  . Glucose, Bld 05/28/2015 94  65 - 99 mg/dL Final  . BUN 05/28/2015 13  6 - 20 mg/dL Final  . Creatinine, Ser 05/28/2015 0.94  0.44 - 1.00 mg/dL Final  . Calcium 05/28/2015 9.1  8.9 - 10.3 mg/dL Final  . Total Protein 05/28/2015 5.8* 6.5 - 8.1 g/dL Final  . Albumin 05/28/2015 3.4* 3.5 - 5.0 g/dL Final  . AST 05/28/2015 22  15 - 41 U/L Final  . ALT 05/28/2015 10* 14 - 54 U/L Final  . Alkaline Phosphatase 05/28/2015 46  38 - 126 U/L Final  . Total Bilirubin 05/28/2015 0.6  0.3 - 1.2 mg/dL Final  . GFR calc non Af Amer 05/28/2015 53* >60 mL/min Final  .  GFR calc Af Amer 05/28/2015 >60  >60 mL/min Final   Comment: (NOTE) The eGFR has been calculated using the CKD EPI equation. This calculation has not been validated in all clinical situations. eGFR's persistently <60 mL/min signify possible Chronic Kidney Disease.   . Anion gap 05/28/2015 12  5 - 15 Final  . WBC 05/28/2015 4.3  4.0 - 10.5 K/uL Final  . RBC 05/28/2015 4.26  3.87 - 5.11 MIL/uL Final  . Hemoglobin 05/28/2015 12.8  12.0 - 15.0 g/dL Final  . HCT 05/28/2015 39.5  36.0 - 46.0 % Final  . MCV 05/28/2015 92.7  78.0 - 100.0 fL Final  . MCH 05/28/2015 30.0  26.0 - 34.0 pg Final  . MCHC 05/28/2015 32.4  30.0 - 36.0 g/dL Final  . RDW 05/28/2015 14.6  11.5 - 15.5 % Final  . Platelets 05/28/2015 141* 150 - 400 K/uL Final  . Neutrophils Relative % 05/28/2015 53   Final  . Neutro Abs 05/28/2015 2.3  1.7 - 7.7 K/uL Final  . Lymphocytes Relative 05/28/2015 29   Final  . Lymphs Abs 05/28/2015 1.2  0.7 - 4.0 K/uL Final  . Monocytes Relative 05/28/2015 17   Final  . Monocytes Absolute 05/28/2015 0.7  0.1 - 1.0 K/uL Final  . Eosinophils Relative 05/28/2015 2   Final  . Eosinophils Absolute 05/28/2015 0.1  0.0 - 0.7 K/uL Final  . Basophils Relative 05/28/2015 1   Final  . Basophils Absolute  05/28/2015 0.0  0.0 - 0.1 K/uL Final  . Specimen Description 05/28/2015 URINE, RANDOM   Final  . Special Requests 05/28/2015 NONE   Final  . Culture 05/28/2015 MULTIPLE SPECIES PRESENT, SUGGEST RECOLLECTION   Final  . Report Status 05/28/2015 05/29/2015 FINAL   Final  . Color, Urine 05/28/2015 YELLOW  YELLOW Final  . APPearance 05/28/2015 CLOUDY* CLEAR Final  . Specific Gravity, Urine 05/28/2015 1.012  1.005 - 1.030 Final  . pH 05/28/2015 7.0  5.0 - 8.0 Final  . Glucose, UA 05/28/2015 NEGATIVE  NEGATIVE mg/dL Final  . Hgb urine dipstick 05/28/2015 NEGATIVE  NEGATIVE Final  . Bilirubin Urine 05/28/2015 NEGATIVE  NEGATIVE Final  . Ketones, ur 05/28/2015 NEGATIVE  NEGATIVE mg/dL Final  . Protein, ur 05/28/2015 NEGATIVE  NEGATIVE mg/dL Final  . Nitrite 05/28/2015 NEGATIVE  NEGATIVE Final  . Leukocytes, UA 05/28/2015 LARGE* NEGATIVE Final  . Squamous Epithelial / LPF 05/28/2015 6-30* NONE SEEN Final  . WBC, UA 05/28/2015 TOO NUMEROUS TO COUNT  0 - 5 WBC/hpf Final  . RBC / HPF 05/28/2015 0-5  0 - 5 RBC/hpf Final  . Bacteria, UA 05/28/2015 RARE* NONE SEEN Final  Appointment on 03/20/2015  Component Date Value Ref Range Status  . Free T4 03/20/2015 1.27  0.82 - 1.77 ng/dL Final     Dg Chest 1 View  05/28/2015  CLINICAL DATA:  Chest pain, left hip pain status post fall EXAM: CHEST 1 VIEW COMPARISON:  01/01/2015 FINDINGS: Lungs are essentially clear.  No pleural effusion or pneumothorax. Cardiomegaly. Post-traumatic deformity involving the lateral/distal left clavicle. IMPRESSION: No evidence of acute cardiopulmonary disease. Electronically Signed   By: Julian Hy M.D.   On: 05/28/2015 09:17   Ct Head Wo Contrast  05/28/2015  CLINICAL DATA:  Pain following fall.  Gait disturbance.  Dizziness. EXAM: CT HEAD WITHOUT CONTRAST CT CERVICAL SPINE WITHOUT CONTRAST TECHNIQUE: Multidetector CT imaging of the head and cervical spine was performed following the standard protocol without intravenous  contrast. Multiplanar CT image reconstructions of the  cervical spine were also generated. COMPARISON:  Head CT and brain MRI December 18, 2014 FINDINGS: CT HEAD FINDINGS Moderate diffuse atrophy is stable. There is no intracranial mass, hemorrhage, extra-axial fluid collection, or midline shift. There is extensive small vessel disease throughout the centra semiovale bilaterally. A prior small infarct extends into the right frontal lobe gray-white junction in the mid right frontal region. There is a prior stable infarct in the right inferior centrum semiovale. There is evidence of a prior infarct in the mid portion of the left occipital lobe. There is evidence of a prior small infarct in the mid posterior right cerebellum. There is no new gray-white compartment lesion. No acute infarct evident. The bony calvarium appears intact. The mastoid air cells are clear. There are no intraorbital lesions. CT CERVICAL SPINE FINDINGS There is no demonstrable acute fracture. There is slight retrolisthesis of C3 on C4. There is no other spondylolisthesis. Prevertebral soft tissues and predental space regions are normal. There is marked disc space narrowing at C3-4, C6-7, and C7-T1. There is moderately severe disc space narrowing at C4-5 and C5-6. There is facet hypertrophy at most levels bilaterally. There is exit foraminal narrowing due to bony hypertrophy at C3-4 bilaterally, more severe on the right than on the left, at C4-5 on the left, at C5-6 bilaterally, and at C6-7 on the left. There is no frank disc extrusion or stenosis. IMPRESSION: CT head: Atrophy with extensive periventricular small vessel disease. Prior infarct at the gray - white junction of the mid right frontal lobe. Prior left midportion occipital lobe infarct. Stable right inferior centrum semiovale infarct. Prior small infarct posterior mid right cerebellum. No acute infarct evident. No intracranial mass, hemorrhage, or extra-axial fluid collection. CT  cervical spine: No acute fracture evident. Slight spondylolisthesis at C3-4 is felt to be due to underlying spondylosis. No other spondylolisthesis. Multilevel arthropathy. Electronically Signed   By: Lowella Grip III M.D.   On: 05/28/2015 09:04   Ct Cervical Spine Wo Contrast  05/28/2015  CLINICAL DATA:  Pain following fall.  Gait disturbance.  Dizziness. EXAM: CT HEAD WITHOUT CONTRAST CT CERVICAL SPINE WITHOUT CONTRAST TECHNIQUE: Multidetector CT imaging of the head and cervical spine was performed following the standard protocol without intravenous contrast. Multiplanar CT image reconstructions of the cervical spine were also generated. COMPARISON:  Head CT and brain MRI December 18, 2014 FINDINGS: CT HEAD FINDINGS Moderate diffuse atrophy is stable. There is no intracranial mass, hemorrhage, extra-axial fluid collection, or midline shift. There is extensive small vessel disease throughout the centra semiovale bilaterally. A prior small infarct extends into the right frontal lobe gray-white junction in the mid right frontal region. There is a prior stable infarct in the right inferior centrum semiovale. There is evidence of a prior infarct in the mid portion of the left occipital lobe. There is evidence of a prior small infarct in the mid posterior right cerebellum. There is no new gray-white compartment lesion. No acute infarct evident. The bony calvarium appears intact. The mastoid air cells are clear. There are no intraorbital lesions. CT CERVICAL SPINE FINDINGS There is no demonstrable acute fracture. There is slight retrolisthesis of C3 on C4. There is no other spondylolisthesis. Prevertebral soft tissues and predental space regions are normal. There is marked disc space narrowing at C3-4, C6-7, and C7-T1. There is moderately severe disc space narrowing at C4-5 and C5-6. There is facet hypertrophy at most levels bilaterally. There is exit foraminal narrowing due to bony hypertrophy at C3-4  bilaterally, more  severe on the right than on the left, at C4-5 on the left, at C5-6 bilaterally, and at C6-7 on the left. There is no frank disc extrusion or stenosis. IMPRESSION: CT head: Atrophy with extensive periventricular small vessel disease. Prior infarct at the gray - white junction of the mid right frontal lobe. Prior left midportion occipital lobe infarct. Stable right inferior centrum semiovale infarct. Prior small infarct posterior mid right cerebellum. No acute infarct evident. No intracranial mass, hemorrhage, or extra-axial fluid collection. CT cervical spine: No acute fracture evident. Slight spondylolisthesis at C3-4 is felt to be due to underlying spondylosis. No other spondylolisthesis. Multilevel arthropathy. Electronically Signed   By: Lowella Grip III M.D.   On: 05/28/2015 09:04   Dg Hip Unilat W Or W/o Pelvis 2-3 Views Left  05/28/2015  CLINICAL DATA:  Chest pain, left hip pain from fall. EXAM: DG HIP (WITH OR WITHOUT PELVIS) 2-3V LEFT COMPARISON:  None. FINDINGS: Mild symmetric degenerative changes in the hips bilaterally. No acute bony abnormality. Specifically, no fracture, subluxation, or dislocation. Soft tissues are intact. IMPRESSION: No acute bony abnormality. Electronically Signed   By: Rolm Baptise M.D.   On: 05/28/2015 09:13     Assessment/Plan   ICD-9-CM ICD-10-CM   1. Allergic rhinitis due to pollen 477.0 J30.1   2. Allergic conjunctivitis, right 372.14 H10.11   3. Mild cognitive impairment with memory loss 331.83 G31.84    780.93    4. Essential hypertension 401.9 I10   5. Constipation, unspecified constipation type 564.00 K59.00   6. Hyperlipidemia 272.4 E78.5   7. External hemorrhoids without complication 368.5 R92.3   8. Urge incontinence of urine 788.31 N39.41   9. History of CVA (cerebrovascular accident) V12.54 Z86.73   10. Protein-calorie malnutrition ([place]) 263.9 E46    Recommend OTC plain zyrtec daily for seasonal allergy  Use eye drops as  needed  Continue other medications as ordered  Follow up with neurology as scheduled  Fall prevention discussed - emphasized to pt to ask for assistance when getting out of bed especially at night  Follow up in 4 mos for routine visit. Check fasting labs at next OV (lipid panel, BMP, ALT)  Jermia Rigsby S. Perlie Gold  [facility] [address] [place], [place] [address] [phone] Cell (Monday-Friday 8 AM - 5 PM) [phone] After 5 PM and follow prompts

## 2015-06-18 ENCOUNTER — Emergency Department (HOSPITAL_COMMUNITY)
Admission: EM | Admit: 2015-06-18 | Discharge: 2015-06-19 | Disposition: A | Discharge status: Home / Self Care | Payer: Medicare Other | Attending: Emergency Medicine | Admitting: Emergency Medicine

## 2015-06-18 ENCOUNTER — Emergency Department (HOSPITAL_COMMUNITY): Payer: Medicare Other

## 2015-06-18 ENCOUNTER — Encounter (HOSPITAL_COMMUNITY): Payer: Self-pay

## 2015-06-18 DIAGNOSIS — F0390 Unspecified dementia without behavioral disturbance: Secondary | ICD-10-CM | POA: Diagnosis not present

## 2015-06-18 DIAGNOSIS — Z87891 Personal history of nicotine dependence: Secondary | ICD-10-CM | POA: Diagnosis not present

## 2015-06-18 DIAGNOSIS — W01198A Fall on same level from slipping, tripping and stumbling with subsequent striking against other object, initial encounter: Secondary | ICD-10-CM | POA: Insufficient documentation

## 2015-06-18 DIAGNOSIS — Z86010 Personal history of colonic polyps: Secondary | ICD-10-CM | POA: Diagnosis not present

## 2015-06-18 DIAGNOSIS — Y998 Other external cause status: Secondary | ICD-10-CM | POA: Insufficient documentation

## 2015-06-18 DIAGNOSIS — Y92091 Bathroom in other non-institutional residence as the place of occurrence of the external cause: Secondary | ICD-10-CM | POA: Insufficient documentation

## 2015-06-18 DIAGNOSIS — S0091XA Abrasion of unspecified part of head, initial encounter: Secondary | ICD-10-CM | POA: Diagnosis not present

## 2015-06-18 DIAGNOSIS — Z79899 Other long term (current) drug therapy: Secondary | ICD-10-CM | POA: Diagnosis not present

## 2015-06-18 DIAGNOSIS — W19XXXA Unspecified fall, initial encounter: Secondary | ICD-10-CM

## 2015-06-18 DIAGNOSIS — Z8639 Personal history of other endocrine, nutritional and metabolic disease: Secondary | ICD-10-CM | POA: Insufficient documentation

## 2015-06-18 DIAGNOSIS — I69398 Other sequelae of cerebral infarction: Secondary | ICD-10-CM | POA: Insufficient documentation

## 2015-06-18 DIAGNOSIS — S0181XA Laceration without foreign body of other part of head, initial encounter: Secondary | ICD-10-CM | POA: Diagnosis not present

## 2015-06-18 DIAGNOSIS — Y9389 Activity, other specified: Secondary | ICD-10-CM | POA: Insufficient documentation

## 2015-06-18 DIAGNOSIS — R531 Weakness: Secondary | ICD-10-CM | POA: Insufficient documentation

## 2015-06-18 DIAGNOSIS — I4891 Unspecified atrial fibrillation: Secondary | ICD-10-CM | POA: Diagnosis not present

## 2015-06-18 DIAGNOSIS — M1990 Unspecified osteoarthritis, unspecified site: Secondary | ICD-10-CM | POA: Diagnosis not present

## 2015-06-18 DIAGNOSIS — S0083XA Contusion of other part of head, initial encounter: Secondary | ICD-10-CM | POA: Diagnosis not present

## 2015-06-18 DIAGNOSIS — Z7901 Long term (current) use of anticoagulants: Secondary | ICD-10-CM | POA: Diagnosis not present

## 2015-06-18 DIAGNOSIS — S199XXA Unspecified injury of neck, initial encounter: Secondary | ICD-10-CM | POA: Diagnosis not present

## 2015-06-18 DIAGNOSIS — G43909 Migraine, unspecified, not intractable, without status migrainosus: Secondary | ICD-10-CM | POA: Insufficient documentation

## 2015-06-18 DIAGNOSIS — S0093XA Contusion of unspecified part of head, initial encounter: Secondary | ICD-10-CM | POA: Diagnosis not present

## 2015-06-18 DIAGNOSIS — M810 Age-related osteoporosis without current pathological fracture: Secondary | ICD-10-CM | POA: Insufficient documentation

## 2015-06-18 DIAGNOSIS — Z8719 Personal history of other diseases of the digestive system: Secondary | ICD-10-CM | POA: Insufficient documentation

## 2015-06-18 DIAGNOSIS — S0990XA Unspecified injury of head, initial encounter: Secondary | ICD-10-CM | POA: Diagnosis not present

## 2015-06-18 DIAGNOSIS — S0081XA Abrasion of other part of head, initial encounter: Secondary | ICD-10-CM | POA: Insufficient documentation

## 2015-06-18 DIAGNOSIS — I10 Essential (primary) hypertension: Secondary | ICD-10-CM | POA: Diagnosis not present

## 2015-06-18 NOTE — ED Notes (Signed)
Pt arrived via EMS c/o fall at home.  Pt hit left side of head has left facial swelling.  Stroke in 2015, left sided deficits, takes eliquis.  Family reports auditory hallucinations.

## 2015-06-19 DIAGNOSIS — S199XXA Unspecified injury of neck, initial encounter: Secondary | ICD-10-CM | POA: Diagnosis not present

## 2015-06-19 DIAGNOSIS — S0990XA Unspecified injury of head, initial encounter: Secondary | ICD-10-CM | POA: Diagnosis not present

## 2015-06-19 NOTE — ED Provider Notes (Signed)
7:25 AM Assumed care from Dr. Rex Kras, please see their note for full history, physical and decision making until this point. In brief this is a 82 y.o. year old female who presented to the ED tonight with Fall     Fall with normal neuro exam, awaiting CT read for disposition.   CT negative. Patient informed, stable for dc.   Discharge instructions, including strict return precautions for new or worsening symptoms, given. Patient and/or family verbalized understanding and agreement with the plan as described.   Labs, studies and imaging reviewed by myself and considered in medical decision making if ordered. Imaging interpreted by radiology.  Labs Reviewed - No data to display  CT Head Wo Contrast  Final Result    CT Cervical Spine Wo Contrast  Final Result      No Follow-up on file.   Merrily Pew, MD 06/19/15 [phone]

## 2015-06-19 NOTE — ED Provider Notes (Signed)
CSN: [id number]     Arrival date & time 06/18/15  2132 History   First MD Initiated Contact with Patient 06/18/15 2225     Chief Complaint  Patient presents with  . Fall     (Consider location/radiation/quality/duration/timing/severity/associated sxs/prior Treatment) HPI Comments: 82 year old female with past medical history including CVA with left-sided weakness, dementia, atrial fibrillation on Eliquis who presents with fall. History obtained from the patient's daughter. Daughter states that she was assisting her in the bathroom tonight just prior to arrival and the patient lost her balance and fell, striking the left side of her head. She did not lose consciousness. They have noted some mild swelling of her left face and a knot on her left side of her head. She has otherwise been at her neurologic baseline. No other complaints of pain.  LEVEL 5 CAVEAT 2/2 DEMENTIA  Patient is a 82 y.o. female presenting with fall. The history is provided by a relative.  Fall    Past Medical History  Diagnosis Date  . Arthritis   . Hypertension   . Migraine   . Colon polyp   . Hearing difficulty   . Torus palatinus   . Atrial flutter ([place]) January, 2012  . Stroke ([place]) 08/02/12     right lenticular nucleus infarct  . Left leg weakness 02/05/2013  . Chronic anticoagulation   . High cholesterol   . Atrial fibrillation ([place])     on Eliquis Rx  . Mild cognitive impairment with memory loss   . Malnutrition ([place])   . TIA (transient ischemic attack)   . Constipation   . Osteoporosis   . Hemorrhoids    Past Surgical History  Procedure Laterality Date  . Cataract extraction w/ intraocular lens  implant, bilateral  2006-2008  . Ganglion cyst excision Bilateral 1938,1954,2003,2005    "wrists/hand" (08/01/2012)  . Cardioversion  05/19/2010    Dr. Einar Gip  . Tonsillectomy  ~ 1935  . Appendectomy  02/19/53    `  . Mouth surgery      [name]   Family History  Problem Relation Age of Onset  . Colon  cancer Father   . Breast cancer    . Brain cancer    . Cancer Mother     breast  . Aneurysm Mother     brain  . Heart attack Neg Hx   . Diabetes Neg Hx   . Hypertension Neg Hx   . Stroke Mother    Social History  Substance Use Topics  . Smoking status: Former Research scientist (life sciences)  . Smokeless tobacco: Never Used  . Alcohol Use: No     Comment: 08/01/2012 "glass of wine on special occasions"   OB History    No data available     Review of Systems  Unable to perform ROS: Dementia      Allergies  Aricept; Tramadol hcl; and Alendronate sodium  Home Medications   Prior to Admission medications   Medication Sig Start Date End Date Taking? Authorizing Provider  acetaminophen (TYLENOL) 500 MG tablet Take 1,000 mg by mouth as needed for mild pain or headache.    Yes Historical Provider, MD  b complex vitamins tablet Take 1 tablet by mouth 2 (two) times daily.    Yes Historical Provider, MD  Calcium Carb-Cholecalciferol (CALCIUM 600 + D) 600-200 MG-UNIT TABS Take 1 tablet by mouth 2 (two) times daily.   Yes Historical Provider, MD  cetirizine (ZYRTEC) 10 MG tablet Take 10 mg by mouth at bedtime.  Yes Historical Provider, MD  Cholecalciferol (VITAMIN D) 2000 UNITS tablet Take 2,000 Units by mouth daily.   Yes Historical Provider, MD  diphenhydramine-acetaminophen (TYLENOL PM) 25-500 MG TABS Take 2 tablets by mouth at bedtime as needed (sleep).    Yes Historical Provider, MD  divalproex (DEPAKOTE) 125 MG DR tablet Take 2 tablets (250 mg total) by mouth 2 (two) times daily. 07/25/14  Yes Garvin Fila, MD  Docusate Sodium (COLACE PO) Take 100 mg by mouth 2 (two) times daily.    Yes Historical Provider, MD  ELIQUIS 2.5 MG TABS tablet TAKE 1 TABLET BY MOUTH TWICE DAILY 03/25/15  Yes Garvin Fila, MD  hydrocortisone (ANUSOL-HC) 2.5 % rectal cream Place 1 application rectally as needed for hemorrhoids or itching.   Yes Historical Provider, MD  hydrocortisone (ANUSOL-HC) 25 MG suppository Place 25 mg  rectally as needed for hemorrhoids or itching.   Yes Historical Provider, MD  Melatonin 3 MG TABS Take 1 tablet by mouth at bedtime   Yes Historical Provider, MD  Multiple Vitamin (MULTIVITAMIN WITH MINERALS) TABS Take 1 tablet by mouth daily.   Yes Historical Provider, MD  olopatadine (PATANOL) 0.1 % ophthalmic solution Place 1 drop into both eyes 2 (two) times daily. 06/11/15  Yes Monica Carter, DO  ondansetron (ZOFRAN) 4 MG tablet Take 1 tablet (4 mg total) by mouth every 6 (six) hours as needed for nausea. 10/31/14  Yes Delfina Redwood, MD  oxybutynin (DITROPAN) 5 MG tablet TAKE 1 TABLET BY MOUTH 2 TIMES DAILY. 04/23/15  Yes Monica Carter, DO   BP 161/70 mmHg  Pulse 79  Temp(Src) 98.2 F (36.8 C) (Oral)  Resp 19  SpO2 96% Physical Exam  Constitutional: She appears well-developed and well-nourished. No distress.  Thin, frail  HENT:  Head:    Moist mucous membranes, hematoma and small abrasion L side of head just behind ear; mild L cheek swelling  Eyes: Conjunctivae are normal. Pupils are equal, round, and reactive to light.  Neck: Neck supple.  Cardiovascular: Normal rate, regular rhythm and normal heart sounds.   No murmur heard. Pulmonary/Chest: Effort normal and breath sounds normal. She exhibits no tenderness.  Abdominal: Soft. Bowel sounds are normal. She exhibits no distension. There is no tenderness.  Musculoskeletal: She exhibits no edema or tenderness.  Neurological: She is alert.  Oriented to person  Skin: Skin is warm and dry.  Ecchymosis and abrasion L side of head  Nursing note and vitals reviewed.   ED Course  Procedures (including critical care time) Labs Review Labs Reviewed - No data to display  Imaging Review No results found.    EKG Interpretation   Date/Time:  Wednesday June 18 2015 21:43:22 EDT Ventricular Rate:  79 PR Interval:    QRS Duration: 97 QT Interval:  396 QTC Calculation: 454 R Axis:   38 Text Interpretation:  Atrial flutter  with predominant 3:1 AV block  Anteroseptal infarct, old Borderline repolarization abnormality Artifact  in lead(s) III aVL Interpretation limited secondary to artifact No  significant change since last tracing Confirmed by LITTLE MD, [place]  [id number]) on 06/18/2015 10:20:53 PM      MDM   Final diagnoses:  None   Patient with history of dementia as well as A. fib on Eliquis presents with fall and head injury. She was awake, alert and comfortable on exam with reassuring vital signs. Hematoma noted to the left side of head and mild left cheek swelling. Obtained CT of head and C-spine to  rule out acute injury. The patient is otherwise without complaints. I am signing the patient out to the oncoming provider. I anticipate that she will be able to go home if her imaging is normal. I have already discussed with family her risk of delayed head bleed and the importance of return precautions including any change in her neurologic status. They have voiced understanding.  Sharlett Iles, MD 06/19/15 [phone]

## 2015-06-19 NOTE — ED Notes (Signed)
Pt stable, ambulatory, states understanding of discharge instructions 

## 2015-06-23 ENCOUNTER — Emergency Department (HOSPITAL_COMMUNITY)
Admission: EM | Admit: 2015-06-23 | Discharge: 2015-06-23 | Disposition: A | Discharge status: Home / Self Care | Payer: Medicare Other | Attending: Emergency Medicine | Admitting: Emergency Medicine

## 2015-06-23 ENCOUNTER — Telehealth: Payer: Self-pay

## 2015-06-23 ENCOUNTER — Emergency Department (HOSPITAL_COMMUNITY): Payer: Medicare Other

## 2015-06-23 ENCOUNTER — Encounter (HOSPITAL_COMMUNITY): Payer: Self-pay | Admitting: Emergency Medicine

## 2015-06-23 DIAGNOSIS — Z7901 Long term (current) use of anticoagulants: Secondary | ICD-10-CM | POA: Insufficient documentation

## 2015-06-23 DIAGNOSIS — H9190 Unspecified hearing loss, unspecified ear: Secondary | ICD-10-CM | POA: Insufficient documentation

## 2015-06-23 DIAGNOSIS — Z8719 Personal history of other diseases of the digestive system: Secondary | ICD-10-CM | POA: Insufficient documentation

## 2015-06-23 DIAGNOSIS — R402411 Glasgow coma scale score 13-15, in the field [EMT or ambulance]: Secondary | ICD-10-CM | POA: Diagnosis not present

## 2015-06-23 DIAGNOSIS — Z8669 Personal history of other diseases of the nervous system and sense organs: Secondary | ICD-10-CM | POA: Insufficient documentation

## 2015-06-23 DIAGNOSIS — R4182 Altered mental status, unspecified: Secondary | ICD-10-CM | POA: Insufficient documentation

## 2015-06-23 DIAGNOSIS — R410 Disorientation, unspecified: Secondary | ICD-10-CM | POA: Diagnosis not present

## 2015-06-23 DIAGNOSIS — Z8639 Personal history of other endocrine, nutritional and metabolic disease: Secondary | ICD-10-CM | POA: Insufficient documentation

## 2015-06-23 DIAGNOSIS — I10 Essential (primary) hypertension: Secondary | ICD-10-CM | POA: Insufficient documentation

## 2015-06-23 DIAGNOSIS — Z86010 Personal history of colonic polyps: Secondary | ICD-10-CM | POA: Diagnosis not present

## 2015-06-23 DIAGNOSIS — Z8673 Personal history of transient ischemic attack (TIA), and cerebral infarction without residual deficits: Secondary | ICD-10-CM | POA: Diagnosis not present

## 2015-06-23 DIAGNOSIS — M1990 Unspecified osteoarthritis, unspecified site: Secondary | ICD-10-CM | POA: Insufficient documentation

## 2015-06-23 DIAGNOSIS — M810 Age-related osteoporosis without current pathological fracture: Secondary | ICD-10-CM | POA: Insufficient documentation

## 2015-06-23 LAB — COMPREHENSIVE METABOLIC PANEL
ALK PHOS: 48 U/L (ref 38–126)
ALT: 10 U/L — AB (ref 14–54)
AST: 22 U/L (ref 15–41)
Albumin: 3.1 g/dL — ABNORMAL LOW (ref 3.5–5.0)
Anion gap: 7 (ref 5–15)
BUN: 16 mg/dL (ref 6–20)
CALCIUM: 9.3 mg/dL (ref 8.9–10.3)
CHLORIDE: 106 mmol/L (ref 101–111)
CO2: 30 mmol/L (ref 22–32)
CREATININE: 0.96 mg/dL (ref 0.44–1.00)
GFR calc Af Amer: 59 mL/min — ABNORMAL LOW (ref >60)
GFR, EST NON AFRICAN AMERICAN: 51 mL/min — AB (ref >60)
Glucose, Bld: 85 mg/dL (ref 65–99)
Potassium: 4.2 mmol/L (ref 3.5–5.1)
SODIUM: 143 mmol/L (ref 135–145)
Total Bilirubin: 0.4 mg/dL (ref 0.3–1.2)
Total Protein: 5.2 g/dL — ABNORMAL LOW (ref 6.5–8.1)

## 2015-06-23 LAB — CBC WITH DIFFERENTIAL/PLATELET
Basophils Absolute: 0 10*3/uL (ref 0.0–0.1)
Basophils Relative: 1 %
EOS ABS: 0.1 10*3/uL (ref 0.0–0.7)
EOS PCT: 2 %
HCT: 37.6 % (ref 36.0–46.0)
Hemoglobin: 11.9 g/dL — ABNORMAL LOW (ref 12.0–15.0)
LYMPHS ABS: 1.3 10*3/uL (ref 0.7–4.0)
Lymphocytes Relative: 29 %
MCH: 30.1 pg (ref 26.0–34.0)
MCHC: 31.6 g/dL (ref 30.0–36.0)
MCV: 94.9 fL (ref 78.0–100.0)
MONO ABS: 0.6 10*3/uL (ref 0.1–1.0)
MONOS PCT: 15 %
Neutro Abs: 2.3 10*3/uL (ref 1.7–7.7)
Neutrophils Relative %: 53 %
PLATELETS: 173 10*3/uL (ref 150–400)
RBC: 3.96 MIL/uL (ref 3.87–5.11)
RDW: 14.7 % (ref 11.5–15.5)
WBC: 4.3 10*3/uL (ref 4.0–10.5)

## 2015-06-23 LAB — URINE MICROSCOPIC-ADD ON

## 2015-06-23 LAB — URINALYSIS, ROUTINE W REFLEX MICROSCOPIC
BILIRUBIN URINE: NEGATIVE
GLUCOSE, UA: NEGATIVE mg/dL
KETONES UR: NEGATIVE mg/dL
Nitrite: NEGATIVE
PH: 7 (ref 5.0–8.0)
Protein, ur: NEGATIVE mg/dL
Specific Gravity, Urine: 1.011 (ref 1.005–1.030)

## 2015-06-23 LAB — CBG MONITORING, ED: Glucose-Capillary: 79 mg/dL (ref 65–99)

## 2015-06-23 LAB — I-STAT CHEM 8, ED
BUN: 21 mg/dL — AB (ref 6–20)
CHLORIDE: 102 mmol/L (ref 101–111)
Calcium, Ion: 1.22 mmol/L (ref 1.13–1.30)
Creatinine, Ser: 0.9 mg/dL (ref 0.44–1.00)
Glucose, Bld: 85 mg/dL (ref 65–99)
HEMATOCRIT: 38 % (ref 36.0–46.0)
HEMOGLOBIN: 12.9 g/dL (ref 12.0–15.0)
POTASSIUM: 4.2 mmol/L (ref 3.5–5.1)
SODIUM: 144 mmol/L (ref 135–145)
TCO2: 31 mmol/L (ref 0–100)

## 2015-06-23 LAB — ETHANOL

## 2015-06-23 LAB — I-STAT TROPONIN, ED: Troponin i, poc: 0.02 ng/mL (ref 0.00–0.08)

## 2015-06-23 LAB — I-STAT CG4 LACTIC ACID, ED: Lactic Acid, Venous: 0.76 mmol/L (ref 0.5–2.0)

## 2015-06-23 LAB — AMMONIA: Ammonia: 21 umol/L (ref 9–35)

## 2015-06-23 MED ORDER — CEPHALEXIN 500 MG PO CAPS: 500.0000 mg | ORAL_CAPSULE | Freq: Four times a day (QID) | ORAL | Status: DC

## 2015-06-23 MED ORDER — IOHEXOL 350 MG/ML SOLN
50.0000 mL | Freq: Once | INTRAVENOUS | Status: AC | PRN
  Administered 2015-06-23: 50 mL via INTRAVENOUS

## 2015-06-23 NOTE — Telephone Encounter (Signed)
Discussed with patient's daughter [name]. Kim verbalized understanding Dr.Reed 's response

## 2015-06-23 NOTE — ED Notes (Signed)
Discharge instructions reviewed with daughters at bedside.  Patient taken home with daughters, taken out in a wheelchair.

## 2015-06-23 NOTE — Telephone Encounter (Signed)
Message left on triage voicemail patient had a fall on 06/18/15 and was seen in the ER. Patient checked out ok at the ER but since discharge patient is more confused, weak and gets easily wiped out with the least bit of activity. No available appointment's today.  Last OV 06-11-15, Pending OV 10-10-15  Please advise

## 2015-06-23 NOTE — Telephone Encounter (Signed)
Unfortunately, she will need to return to the ED.  She may have had a concussion or may need further lab evaluation.

## 2015-06-23 NOTE — ED Notes (Signed)
EMS - Patient coming from home with increased confusion worsening since yesterday following a fall on Thursday (06/19/15).  Patient has bruising from the fall to the left posterior head after hitting a dresser.  Patient is on blood thinners. Vital signs with EMS 220/120, HR 91, 18 respirations, 95% RA and 125 CBG.

## 2015-06-23 NOTE — Discharge Instructions (Signed)
Confusion Confusion is the inability to think with your usual speed or clarity. Confusion may come on quickly or slowly over time. How quickly the confusion comes on depends on the cause. Confusion can be due to any number of causes. CAUSES   Concussion, head injury, or head trauma.  Seizures.  Stroke.  Fever.  Brain tumor.  Age related decreased brain function (dementia).  Heightened emotional states like rage or terror.  Mental illness in which the person loses the ability to determine what is real and what is not (hallucinations).  Infections such as a urinary tract infection (UTI).  Toxic effects from alcohol, drugs, or prescription medicines.  Dehydration and an imbalance of salts in the body (electrolytes).  Lack of sleep.  Low blood sugar (diabetes).  Low levels of oxygen from conditions such as chronic lung disorders.  Drug interactions or other medicine side effects.  Nutritional deficiencies, especially niacin, thiamine, vitamin C, or vitamin B.  Sudden drop in body temperature (hypothermia).  Change in routine, such as when traveling or hospitalized. SIGNS AND SYMPTOMS  People often describe their thinking as cloudy or unclear when they are confused. Confusion can also include feeling disoriented. That means you are unaware of where or who you are. You may also not know what the date or time is. If confused, you may also have difficulty paying attention, remembering, and making decisions. Some people also act aggressively when they are confused.  DIAGNOSIS  The medical evaluation of confusion may include:  Blood and urine tests.  X-rays.  Brain and nervous system tests.  Analyzing your brain waves (electroencephalogram or EEG).  Magnetic resonance imaging (MRI) of your head.  Computed tomography (CT) scan of your head.  Mental status tests in which your health care provider may ask many questions. Some of these questions may seem silly or strange,  but they are a very important test to help diagnose and treat confusion. TREATMENT  An admission to the hospital may not be needed, but a person with confusion should not be left alone. Stay with a family member or friend until the confusion clears. Avoid alcohol, pain relievers, or sedative drugs until you have fully recovered. Do not drive until directed by your health care provider. HOME CARE INSTRUCTIONS  What family and friends can do:  To find out if someone is confused, ask the person to state his or her name, age, and the date. If the person is unsure or answers incorrectly, he or she is confused.  Always introduce yourself, no matter how well the person knows you.  Often remind the person of his or her location.  Place a calendar and clock near the confused person.  Help the person with his or her medicines. You may want to use a pill box, an alarm as a reminder, or give the person each dose as prescribed.  Talk about current events and plans for the day.  Try to keep the environment calm, quiet, and peaceful.  Make sure the person keeps follow-up visits with his or her health care provider. PREVENTION  Ways to prevent confusion:  Avoid alcohol.  Eat a balanced diet.  Get enough sleep.  Take medicine only as directed by your health care provider.  Do not become isolated. Spend time with other people and make plans for your days.  Keep careful watch on your blood sugar levels if you are diabetic. SEEK IMMEDIATE MEDICAL CARE IF:   You develop severe headaches, repeated vomiting, seizures, blackouts, or   slurred speech.  There is increasing confusion, weakness, numbness, restlessness, or personality changes.  You develop a loss of balance, have marked dizziness, feel uncoordinated, or fall.  You have delusions, hallucinations, or develop severe anxiety.  Your family members think you need to be rechecked.   This information is not intended to replace advice given  to you by your health care provider. Make sure you discuss any questions you have with your health care provider.   Document Released: 04/22/2004 Document Revised: 04/05/2014 Document Reviewed: 04/20/2013 Elsevier Interactive Patient Education 2016 [employer]  

## 2015-06-23 NOTE — ED Provider Notes (Signed)
CSN: [id number]     Arrival date & time 06/23/15  1604 History   First MD Initiated Contact with Patient 06/23/15 1608     Chief Complaint  Patient presents with  . Altered Mental Status     (Consider location/radiation/quality/duration/timing/severity/associated sxs/prior Treatment) Patient is a 82 y.o. female presenting with altered mental status. The history is provided by the patient and a relative.  Altered Mental Status Presenting symptoms: confusion   Severity:  Mild Most recent episode:  More than 2 days ago Episode history:  Continuous Duration:  2 days Timing:  Constant Progression:  Unchanged Chronicity:  Recurrent Context: dementia and head injury   Associated symptoms: no abdominal pain, no bladder incontinence, no fever, no headaches, no nausea, no palpitations and no vomiting    82 yo F With a chief complaint of altered mental status. Family says she's been more confused and weak than normal for about the past week. Getting worse over the past couple days. Having trouble getting from the bed to wheelchair and back. Denies any infectious symptoms. Last week she fell and struck the left side of her head was evaluated here with no intracranial bleeding. Patient is on Eliquis. Stating she's been favoring her left arm and left leg. Has a history of the stroke in the past and has chronic left-sided weakness.They say that comes and goes with infections or other illnesses. She is also been hearing a whooshing sound in her left ear.  Past Medical History  Diagnosis Date  . Arthritis   . Hypertension   . Migraine   . Colon polyp   . Hearing difficulty   . Torus palatinus   . Atrial flutter ([place]) January, 2012  . Stroke ([place]) 08/02/12     right lenticular nucleus infarct  . Left leg weakness 02/05/2013  . Chronic anticoagulation   . High cholesterol   . Atrial fibrillation ([place])     on Eliquis Rx  . Mild cognitive impairment with memory loss   . Malnutrition ([place])   . TIA  (transient ischemic attack)   . Constipation   . Osteoporosis   . Hemorrhoids    Past Surgical History  Procedure Laterality Date  . Cataract extraction w/ intraocular lens  implant, bilateral  2006-2008  . Ganglion cyst excision Bilateral 1938,1954,2003,2005    "wrists/hand" (08/01/2012)  . Cardioversion  05/19/2010    Dr. Einar Gip  . Tonsillectomy  ~ 1935  . Appendectomy  02/19/53    `  . Mouth surgery      [name]   Family History  Problem Relation Age of Onset  . Colon cancer Father   . Breast cancer    . Brain cancer    . Cancer Mother     breast  . Aneurysm Mother     brain  . Heart attack Neg Hx   . Diabetes Neg Hx   . Hypertension Neg Hx   . Stroke Mother    Social History  Substance Use Topics  . Smoking status: Former Research scientist (life sciences)  . Smokeless tobacco: Never Used  . Alcohol Use: No     Comment: 08/01/2012 "glass of wine on special occasions"   OB History    No data available     Review of Systems  Constitutional: Negative for fever and chills.  HENT: Negative for congestion and rhinorrhea.   Eyes: Negative for redness and visual disturbance.  Respiratory: Negative for shortness of breath and wheezing.   Cardiovascular: Negative for chest pain and palpitations.  Gastrointestinal: Negative for nausea, vomiting and abdominal pain.  Genitourinary: Negative for bladder incontinence, dysuria and urgency.  Musculoskeletal: Positive for neck pain. Negative for myalgias and arthralgias.  Skin: Negative for pallor and wound.  Neurological: Negative for dizziness and headaches.  Psychiatric/Behavioral: Positive for confusion.      Allergies  Aricept; Tramadol hcl; and Alendronate sodium  Home Medications   Prior to Admission medications   Medication Sig Start Date End Date Taking? Authorizing Provider  acetaminophen (TYLENOL) 500 MG tablet Take 1,000 mg by mouth as needed for mild pain or headache.     Historical Provider, MD  b complex vitamins tablet Take 1 tablet  by mouth 2 (two) times daily.     Historical Provider, MD  Calcium Carb-Cholecalciferol (CALCIUM 600 + D) 600-200 MG-UNIT TABS Take 1 tablet by mouth 2 (two) times daily.    Historical Provider, MD  cetirizine (ZYRTEC) 10 MG tablet Take 10 mg by mouth at bedtime.    Historical Provider, MD  Cholecalciferol (VITAMIN D) 2000 UNITS tablet Take 2,000 Units by mouth daily.    Historical Provider, MD  diphenhydramine-acetaminophen (TYLENOL PM) 25-500 MG TABS Take 2 tablets by mouth at bedtime as needed (sleep).     Historical Provider, MD  divalproex (DEPAKOTE) 125 MG DR tablet Take 2 tablets (250 mg total) by mouth 2 (two) times daily. 07/25/14   Garvin Fila, MD  Docusate Sodium (COLACE PO) Take 100 mg by mouth 2 (two) times daily.     Historical Provider, MD  ELIQUIS 2.5 MG TABS tablet TAKE 1 TABLET BY MOUTH TWICE DAILY 03/25/15   Garvin Fila, MD  hydrocortisone (ANUSOL-HC) 2.5 % rectal cream Place 1 application rectally as needed for hemorrhoids or itching.    Historical Provider, MD  hydrocortisone (ANUSOL-HC) 25 MG suppository Place 25 mg rectally as needed for hemorrhoids or itching.    Historical Provider, MD  Melatonin 3 MG TABS Take 1 tablet by mouth at bedtime    Historical Provider, MD  Multiple Vitamin (MULTIVITAMIN WITH MINERALS) TABS Take 1 tablet by mouth daily.    Historical Provider, MD  olopatadine (PATANOL) 0.1 % ophthalmic solution Place 1 drop into both eyes 2 (two) times daily. 06/11/15   Monica Carter, DO  ondansetron (ZOFRAN) 4 MG tablet Take 1 tablet (4 mg total) by mouth every 6 (six) hours as needed for nausea. 10/31/14   Delfina Redwood, MD  oxybutynin (DITROPAN) 5 MG tablet TAKE 1 TABLET BY MOUTH 2 TIMES DAILY. 04/23/15   Monica Carter, DO   BP 166/75 mmHg  Pulse 79  Temp(Src) 98.6 F (37 C) (Oral)  Resp 12  Ht 5\' 7"  (1.702 m)  Wt 130 lb (58.968 kg)  BMI 20.36 kg/m2  SpO2 95% Physical Exam  Constitutional: She is oriented to person, place, and time. She appears  well-developed and well-nourished. No distress.  pale  HENT:  Head: Normocephalic and atraumatic.  Eyes: EOM are normal. Pupils are equal, round, and reactive to light.  Neck: Normal range of motion. Neck supple.  Cardiovascular: Normal rate and regular rhythm.  Exam reveals no gallop and no friction rub.   No murmur heard. Pulmonary/Chest: Effort normal. She has no wheezes. She has no rales.  Abdominal: Soft. She exhibits no distension. There is no tenderness. There is no rebound and no guarding.  Musculoskeletal: She exhibits no edema or tenderness.  Neurological: She is alert and oriented to person, place, and time.  Skin: Skin is warm and dry. She  is not diaphoretic.  Psychiatric: She has a normal mood and affect. Her behavior is normal.  Nursing note and vitals reviewed.   ED Course  Procedures (including critical care time) Labs Review Labs Reviewed  URINE CULTURE  AMMONIA  COMPREHENSIVE METABOLIC PANEL  CBC WITH DIFFERENTIAL/PLATELET  ETHANOL  URINALYSIS, ROUTINE W REFLEX MICROSCOPIC (NOT AT [facility])  I-STAT CHEM 8, ED  I-STAT CG4 LACTIC ACID, ED  CBG MONITORING, ED  I-STAT TROPOININ, ED    Imaging Review No results found. I have personally reviewed and evaluated these images and lab results as part of my medical decision-making.   EKG Interpretation   Date/Time:  Monday June 23 2015 16:05:31 EDT Ventricular Rate:  80 PR Interval:    QRS Duration: 101 QT Interval:  441 QTC Calculation: 509 R Axis:   21 Text Interpretation:  Atrial flutter with predominant 3:1 AV block  Probable anteroseptal infarct, old Prolonged QT interval No significant  change since last tracing Confirmed by Domenica Weightman MD, DANIEL [phone]) on  06/23/2015 4:16:58 PM      MDM   Final diagnoses:  Altered mental status    82 yo F with altered mental status. Patient has urinary tract infection by UA. With patient having neuro symptoms post fall as well as having subjective wishing her left ear  will obtain a CT angios to evaluate for possible aneurysm.    CT scan was negative for acute pathology. Patient looks like she may have a urinary tract infection based on her UA. Large leuks as well as too numerous to count white blood cells. Discussed this with the family they feel this is unlikely. States that she was just treated for urinary tract infection. Feel like that would be unlikely for her to be reinfected. No other etiology of the symptoms were noted on this evaluation. Wrote a prescription for Keflex. PCP follow-up.    I have discussed the diagnosis/risks/treatment options with the patient and family and believe the pt to be eligible for discharge home to follow-up with PCP. We also discussed returning to the ED immediately if new or worsening sx occur. We discussed the sx which are most concerning (e.g., sudden worsening pain, fever, inability to tolerate by mouth) that necessitate immediate return. Medications administered to the patient during their visit and any new prescriptions provided to the patient are listed below.  Medications given during this visit Medications  iohexol (OMNIPAQUE) 350 MG/ML injection 50 mL (50 mLs Intravenous Contrast Given 06/23/15 1734)    Discharge Medication List as of 06/23/2015  7:11 PM    START taking these medications   Details  cephALEXin (KEFLEX) 500 MG capsule Take 1 capsule (500 mg total) by mouth 4 (four) times daily., Starting 06/23/2015, Until Discontinued, Print        The patient appears reasonably screen and/or stabilized for discharge and I doubt any other medical condition or other [facility] requiring further screening, evaluation, or treatment in the ED at this time prior to discharge.     Deno Etienne, DO 06/23/15 2141

## 2015-06-24 MED FILL — CEPHALEXIN 500 MG CAPSULE: 500 | 10 days supply | Qty: 40 | Fill #0

## 2015-06-25 LAB — URINE CULTURE

## 2015-06-30 MED FILL — DIVALPROEX SOD DR 125 MG TA: 125 | 90 days supply | Qty: 360 | Fill #0

## 2015-06-30 MED FILL — OXYBUTYNIN 5 MG TABLET: 5 | 30 days supply | Qty: 60 | Fill #2

## 2015-07-28 ENCOUNTER — Other Ambulatory Visit: Payer: Self-pay | Admitting: Internal Medicine

## 2015-07-28 MED FILL — OXYBUTYNIN 5 MG TABLET: 5 | 30 days supply | Qty: 60 | Fill #0

## 2015-07-28 MED FILL — OLOPATADINE HCL 0.1% EYE DR: 0.1 | 30 days supply | Qty: 5 | Fill #1

## 2015-07-31 ENCOUNTER — Emergency Department (HOSPITAL_BASED_OUTPATIENT_CLINIC_OR_DEPARTMENT_OTHER): Payer: Medicare Other

## 2015-07-31 ENCOUNTER — Emergency Department (HOSPITAL_BASED_OUTPATIENT_CLINIC_OR_DEPARTMENT_OTHER)
Admission: EM | Admit: 2015-07-31 | Discharge: 2015-07-31 | Disposition: A | Discharge status: Home / Self Care | Payer: Medicare Other | Attending: Emergency Medicine | Admitting: Emergency Medicine

## 2015-07-31 DIAGNOSIS — I4892 Unspecified atrial flutter: Secondary | ICD-10-CM | POA: Insufficient documentation

## 2015-07-31 DIAGNOSIS — R1310 Dysphagia, unspecified: Secondary | ICD-10-CM | POA: Diagnosis not present

## 2015-07-31 DIAGNOSIS — M1990 Unspecified osteoarthritis, unspecified site: Secondary | ICD-10-CM | POA: Insufficient documentation

## 2015-07-31 DIAGNOSIS — R0989 Other specified symptoms and signs involving the circulatory and respiratory systems: Secondary | ICD-10-CM | POA: Diagnosis not present

## 2015-07-31 DIAGNOSIS — I4891 Unspecified atrial fibrillation: Secondary | ICD-10-CM | POA: Diagnosis not present

## 2015-07-31 DIAGNOSIS — Z79899 Other long term (current) drug therapy: Secondary | ICD-10-CM | POA: Insufficient documentation

## 2015-07-31 DIAGNOSIS — I10 Essential (primary) hypertension: Secondary | ICD-10-CM | POA: Diagnosis not present

## 2015-07-31 DIAGNOSIS — I639 Cerebral infarction, unspecified: Secondary | ICD-10-CM | POA: Insufficient documentation

## 2015-07-31 DIAGNOSIS — E7800 Pure hypercholesterolemia, unspecified: Secondary | ICD-10-CM | POA: Diagnosis not present

## 2015-07-31 DIAGNOSIS — J029 Acute pharyngitis, unspecified: Secondary | ICD-10-CM | POA: Diagnosis not present

## 2015-07-31 DIAGNOSIS — R079 Chest pain, unspecified: Secondary | ICD-10-CM | POA: Diagnosis not present

## 2015-07-31 DIAGNOSIS — Z87891 Personal history of nicotine dependence: Secondary | ICD-10-CM | POA: Insufficient documentation

## 2015-07-31 DIAGNOSIS — M542 Cervicalgia: Secondary | ICD-10-CM | POA: Diagnosis not present

## 2015-07-31 DIAGNOSIS — T17208A Unspecified foreign body in pharynx causing other injury, initial encounter: Secondary | ICD-10-CM | POA: Diagnosis not present

## 2015-07-31 DIAGNOSIS — R070 Pain in throat: Secondary | ICD-10-CM | POA: Diagnosis not present

## 2015-07-31 DIAGNOSIS — R221 Localized swelling, mass and lump, neck: Secondary | ICD-10-CM | POA: Diagnosis not present

## 2015-07-31 LAB — CBC WITH DIFFERENTIAL/PLATELET
Basophils Absolute: 0 10*3/uL (ref 0.0–0.1)
Basophils Relative: 1 %
EOS ABS: 0.1 10*3/uL (ref 0.0–0.7)
EOS PCT: 2 %
HCT: 39.3 % (ref 36.0–46.0)
Hemoglobin: 13 g/dL (ref 12.0–15.0)
LYMPHS ABS: 1.1 10*3/uL (ref 0.7–4.0)
LYMPHS PCT: 22 %
MCH: 31.6 pg (ref 26.0–34.0)
MCHC: 33.1 g/dL (ref 30.0–36.0)
MCV: 95.4 fL (ref 78.0–100.0)
MONO ABS: 0.5 10*3/uL (ref 0.1–1.0)
MONOS PCT: 11 %
Neutro Abs: 3.3 10*3/uL (ref 1.7–7.7)
Neutrophils Relative %: 64 %
Platelets: 167 10*3/uL (ref 150–400)
RBC: 4.12 MIL/uL (ref 3.87–5.11)
RDW: 14 % (ref 11.5–15.5)
WBC: 5.1 10*3/uL (ref 4.0–10.5)

## 2015-07-31 LAB — BASIC METABOLIC PANEL
ANION GAP: 6 (ref 5–15)
BUN: 20 mg/dL (ref 6–20)
CALCIUM: 8.9 mg/dL (ref 8.9–10.3)
CO2: 30 mmol/L (ref 22–32)
Chloride: 106 mmol/L (ref 101–111)
Creatinine, Ser: 0.88 mg/dL (ref 0.44–1.00)
GFR calc non Af Amer: 57 mL/min — ABNORMAL LOW (ref >60)
Glucose, Bld: 127 mg/dL — ABNORMAL HIGH (ref 65–99)
Potassium: 4.1 mmol/L (ref 3.5–5.1)
Sodium: 142 mmol/L (ref 135–145)

## 2015-07-31 LAB — TROPONIN I

## 2015-07-31 MED ORDER — GLUCAGON HCL RDNA (DIAGNOSTIC) 1 MG IJ SOLR
1.0000 mg | Freq: Once | INTRAMUSCULAR | Status: AC
  Administered 2015-07-31: 1 mg via INTRAVENOUS
  Filled 2015-07-31: qty 1

## 2015-07-31 MED ORDER — OMEPRAZOLE 20 MG PO CPDR: 20.0000 mg | DELAYED_RELEASE_CAPSULE | Freq: Every day | ORAL | Status: DC

## 2015-07-31 MED ORDER — GI COCKTAIL ~~LOC~~: 30.0000 mL | Freq: Once | ORAL | Status: DC

## 2015-07-31 NOTE — ED Notes (Signed)
MD at bedside. 

## 2015-07-31 NOTE — ED Provider Notes (Signed)
CSN: [id number]     Arrival date & time 07/31/15  1036 History   First MD Initiated Contact with Patient 07/31/15 1050     Chief Complaint  Patient presents with  . Dysphagia     (Consider location/radiation/quality/duration/timing/severity/associated sxs/prior Treatment) HPI Comments: Patient from home after apparent choking episode. Her family states she was eating grits at the table when she had a "spasm" in her throat and felt pain in her upper back. Her daughters came into the room and said she was breathing but coughing. Like she was choking. she did not stop breathing and was still able to speak. They pounded on her back and symptoms resolved. Patient with no previous swallowing difficulties. She's had a stroke before with left-sided weakness and had multiple swallow evaluations which by report were normal. She does not take any special kind of diet. Patient feels that she is improving. She feels her voice is still slightly muffled. She is swallowing her saliva okay. She denies any chest pain or shortness of breath. She denies abdominal pain, nausea or vomiting. Patient on eliquis for history of atrial fibrillation.  The history is provided by the patient and a relative. The history is limited by the condition of the patient.    Past Medical History  Diagnosis Date  . Arthritis   . Hypertension   . Migraine   . Colon polyp   . Hearing difficulty   . Torus palatinus   . Atrial flutter ([place]) January, 2012  . Stroke ([place]) 08/02/12     right lenticular nucleus infarct  . Left leg weakness 02/05/2013  . Chronic anticoagulation   . High cholesterol   . Atrial fibrillation ([place])     on Eliquis Rx  . Mild cognitive impairment with memory loss   . Malnutrition ([place])   . TIA (transient ischemic attack)   . Constipation   . Osteoporosis   . Hemorrhoids    Past Surgical History  Procedure Laterality Date  . Cataract extraction w/ intraocular lens  implant, bilateral  2006-2008  .  Ganglion cyst excision Bilateral 1938,1954,2003,2005    "wrists/hand" (08/01/2012)  . Cardioversion  05/19/2010    Dr. Einar Gip  . Tonsillectomy  ~ 1935  . Appendectomy  02/19/53    `  . Mouth surgery      [name]   Family History  Problem Relation Age of Onset  . Colon cancer Father   . Breast cancer    . Brain cancer    . Cancer Mother     breast  . Aneurysm Mother     brain  . Heart attack Neg Hx   . Diabetes Neg Hx   . Hypertension Neg Hx   . Stroke Mother    Social History  Substance Use Topics  . Smoking status: Former Research scientist (life sciences)  . Smokeless tobacco: Never Used  . Alcohol Use: No     Comment: 08/01/2012 "glass of wine on special occasions"   OB History    No data available     Review of Systems  Constitutional: Negative for fever, activity change and appetite change.  HENT: Positive for sore throat, trouble swallowing and voice change. Negative for congestion.   Eyes: Negative for photophobia.  Respiratory: Negative for cough, chest tightness and shortness of breath.   Cardiovascular: Negative for chest pain.  Gastrointestinal: Positive for nausea and vomiting. Negative for abdominal pain.  Genitourinary: Negative for dysuria and hematuria.  Musculoskeletal: Positive for back pain. Negative for neck pain and  neck stiffness.  Skin: Negative for wound.  Neurological: Negative for dizziness, weakness and headaches.  A complete 10 system review of systems was obtained and all systems are negative except as noted in the HPI and PMH.      Allergies  Aricept; Tramadol hcl; and Alendronate sodium  Home Medications   Prior to Admission medications   Medication Sig Start Date End Date Taking? Authorizing Provider  acetaminophen (TYLENOL) 500 MG tablet Take 1,000 mg by mouth as needed for mild pain or headache.    Yes Historical Provider, MD  b complex vitamins tablet Take 1 tablet by mouth 2 (two) times daily.    Yes Historical Provider, MD  Calcium Carb-Cholecalciferol  (CALCIUM 600 + D) 600-200 MG-UNIT TABS Take 1 tablet by mouth 2 (two) times daily.   Yes Historical Provider, MD  cetirizine (ZYRTEC) 10 MG tablet Take 10 mg by mouth at bedtime.   Yes Historical Provider, MD  Cholecalciferol (VITAMIN D) 2000 UNITS tablet Take 2,000 Units by mouth daily.   Yes Historical Provider, MD  diphenhydramine-acetaminophen (TYLENOL PM) 25-500 MG TABS Take 2 tablets by mouth at bedtime as needed (sleep).    Yes Historical Provider, MD  divalproex (DEPAKOTE) 125 MG DR tablet Take 2 tablets (250 mg total) by mouth 2 (two) times daily. 07/25/14  Yes Garvin Fila, MD  Docusate Sodium (COLACE PO) Take 100 mg by mouth 2 (two) times daily.    Yes Historical Provider, MD  ELIQUIS 2.5 MG TABS tablet TAKE 1 TABLET BY MOUTH TWICE DAILY 03/25/15  Yes Garvin Fila, MD  hydrocortisone (ANUSOL-HC) 2.5 % rectal cream Place 1 application rectally as needed for hemorrhoids or itching.   Yes Historical Provider, MD  hydrocortisone (ANUSOL-HC) 25 MG suppository Place 25 mg rectally as needed for hemorrhoids or itching.   Yes Historical Provider, MD  Melatonin 3 MG TABS Take 1 tablet by mouth at bedtime   Yes Historical Provider, MD  Multiple Vitamin (MULTIVITAMIN WITH MINERALS) TABS Take 1 tablet by mouth daily.   Yes Historical Provider, MD  olopatadine (PATANOL) 0.1 % ophthalmic solution Place 1 drop into both eyes 2 (two) times daily. 06/11/15  Yes Monica Carter, DO  ondansetron (ZOFRAN) 4 MG tablet Take 1 tablet (4 mg total) by mouth every 6 (six) hours as needed for nausea. 10/31/14  Yes Delfina Redwood, MD  oxybutynin (DITROPAN) 5 MG tablet TAKE 1 TABLET BY MOUTH 2 TIMES DAILY. 07/28/15  Yes Monica Carter, DO  cephALEXin (KEFLEX) 500 MG capsule Take 1 capsule (500 mg total) by mouth 4 (four) times daily. 06/23/15   Deno Etienne, DO  omeprazole (PRILOSEC) 20 MG capsule Take 1 capsule (20 mg total) by mouth daily. 07/31/15   Ezequiel Essex, MD   BP 140/76 mmHg  Pulse 76  Temp(Src) 97.7 F  (36.5 C) (Oral)  Resp 18  Ht 5' 7.5" (1.715 m)  Wt 130 lb (58.968 kg)  BMI 20.05 kg/m2  SpO2 96% Physical Exam  Constitutional: She is oriented to person, place, and time. She appears well-developed and well-nourished. No distress.  HENT:  Head: Normocephalic and atraumatic.  Mouth/Throat: Oropharynx is clear and moist. No oropharyngeal exudate.  No distress, speaking full sentences, tolerating secretions.  Oropharynx appears clear without foreign body. No crepitance to neck.  Eyes: Conjunctivae and EOM are normal. Pupils are equal, round, and reactive to light.  Neck: Normal range of motion. Neck supple.  No meningismus.  Cardiovascular: Normal rate, normal heart sounds and intact distal  pulses.   No murmur heard. Irregular rhythm  Pulmonary/Chest: Effort normal and breath sounds normal. No respiratory distress. She exhibits no tenderness.  Abdominal: Soft. There is no tenderness. There is no rebound and no guarding.  Musculoskeletal: Normal range of motion. She exhibits no edema or tenderness.  Neurological: She is alert and oriented to person, place, and time. No cranial nerve deficit. She exhibits normal muscle tone. Coordination normal.  L. sided weakness at baseline  Skin: Skin is warm.  Psychiatric: She has a normal mood and affect. Her behavior is normal.  Nursing note and vitals reviewed.   ED Course  Procedures (including critical care time) Labs Review Labs Reviewed  BASIC METABOLIC PANEL - Abnormal; Notable for the following:    Glucose, Bld 127 (*)    GFR calc non Af Amer 57 (*)    All other components within normal limits  CBC WITH DIFFERENTIAL/PLATELET  TROPONIN I    Imaging Review Dg Neck Soft Tissue  07/31/2015  CLINICAL DATA:  Choking this morning.  Pain with swelling. EXAM: NECK SOFT TISSUES - 1+ VIEW COMPARISON:  None. FINDINGS: Degenerative changes are seen in the cervical spine. The soft tissues are unremarkable. The upper lung apices are within  normal limits as well. No pneumothorax. No abnormal air within the soft tissues. IMPRESSION: No acute abnormality. Electronically Signed   By: Dorise Bullion III M.D   On: 07/31/2015 12:19   Dg Chest 2 View  07/31/2015  CLINICAL DATA:  Choking episode this morning eating grits, spasm, pain with swallowing EXAM: CHEST  2 VIEW COMPARISON:  Three/ 27/17 FINDINGS: Cardiomediastinal silhouette is stable. No acute infiltrate or pleural effusion. No pulmonary edema. Stable left basilar scarring. IMPRESSION: No active cardiopulmonary disease. Electronically Signed   By: Lahoma Crocker M.D.   On: 07/31/2015 12:29   I have personally reviewed and evaluated these images and lab results as part of my medical decision-making.   EKG Interpretation   Date/Time:  Thursday Jul 31 2015 11:16:27 EDT Ventricular Rate:  68 PR Interval:    QRS Duration: 86 QT Interval:  426 QTC Calculation: 452 R Axis:   71 Text Interpretation:  Atrial flutter with 4:1 A-V conduction Septal  infarct , age undetermined Abnormal ECG No significant change was found  Confirmed by Wyvonnia Dusky  MD, Trell Secrist [phone]) on 07/31/2015 11:25:25 AM      MDM   Final diagnoses:  Dysphagia   Throat pain after choking episode and difficulty swallowing at home. Symptoms improving. No difficulty breathing or swallowing at this time. Tolerating secretions.  No foreign body visualized. Patient tolerating secretions localizing normally. X-rays are negative for foreign body. Glucagon given.  Patient feels much improved. Her x-rays are negative for foreign body. She denies any throat pain, back pain, chest pain or shortness of breath. She is back to baseline but her daughter sstill feel her voice is a bit muffled.  Patient will be trialed with additional by mouth fluids and crackers. She passed RN swallowing evaluation.  She is tolerating PO well. Her throat pain has resolved. No CP or SOB. Her voice is now back to normal per her daughters. She is  asking to go home. Advised to chew food thoroughly, follow up with PCP and GI for formal swallow evaluation. Return precautions discussed.   Ezequiel Essex, MD 07/31/15 [phone]

## 2015-07-31 NOTE — ED Notes (Signed)
EMS transport from home. Pt reports to EMS she was eating grits and choked. Reported it felt like a spasm and she is now having pain with swallowing

## 2015-07-31 NOTE — Discharge Instructions (Signed)
Dysphagia Follow up with your doctor and the stomach doctor for a swallowing test. Chew your food thoroughly. Return to the ED if you develop new or worsening symptoms. Swallowing problems (dysphagia) occur when solids and liquids seem to stick in your throat on the way down to your stomach, or the food takes longer to get to the stomach. Other symptoms include regurgitating food, noises coming from the throat, chest discomfort with swallowing, and a feeling of fullness or the feeling of something being stuck in your throat when swallowing. When blockage in your throat is complete, it may be associated with drooling. CAUSES  Problems with swallowing may occur because of problems with the muscles. The food cannot be propelled in the usual manner into your stomach. You may have ulcers, scar tissue, or inflammation in the tube down which food travels from your mouth to your stomach (esophagus), which blocks food from passing normally into the stomach. Causes of inflammation include:  Acid reflux from your stomach into your esophagus.  Infection.  Radiation treatment for cancer.  Medicines taken without enough fluids to wash them down into your stomach. You may have nerve problems that prevent signals from being sent to the muscles of your esophagus to contract and move your food down to your stomach. Globus pharyngeus is a relatively common problem in which there is a sense of an obstruction or difficulty in swallowing, without any physical abnormalities of the swallowing passages being found. This problem usually improves over time with reassurance and testing to rule out other causes. DIAGNOSIS Dysphagia can be diagnosed and its cause can be determined by tests in which you swallow a white substance that helps illuminate the inside of your throat (contrast medium) while X-rays are taken. Sometimes a flexible telescope that is inserted down your throat (endoscopy) to look at your esophagus and stomach  is used. TREATMENT   If the dysphagia is caused by acid reflux or infection, medicines may be used.  If the dysphagia is caused by problems with your swallowing muscles, swallowing therapy may be used to help you strengthen your swallowing muscles.  If the dysphagia is caused by a blockage or mass, procedures to remove the blockage may be done. HOME CARE INSTRUCTIONS  Try to eat soft food that is easier to swallow and check your weight on a daily basis to be sure that it is not decreasing.  Be sure to drink liquids when sitting upright (not lying down). SEEK MEDICAL CARE IF:  You are losing weight because you are unable to swallow.  You are coughing when you drink liquids (aspiration).  You are coughing up partially digested food. SEEK IMMEDIATE MEDICAL CARE IF:  You are unable to swallow your own saliva .  You are having shortness of breath or a fever, or both.  You have a hoarse voice along with difficulty swallowing. MAKE SURE YOU:  Understand these instructions.  Will watch your condition.  Will get help right away if you are not doing well or get worse.   This information is not intended to replace advice given to you by your health care provider. Make sure you discuss any questions you have with your health care provider.   Document Released: 03/12/2000 Document Revised: 04/05/2014 Document Reviewed: 09/01/2012 Elsevier Interactive Patient Education [employer].

## 2015-07-31 NOTE — ED Notes (Signed)
EMS also states pt has a Hx of CVA with residual left side deficits. Has routine swallowing evaluations but is not on any special diets at this time

## 2015-07-31 NOTE — ED Notes (Signed)
Family reports pt was eating grits and got choked. Pt does not have any prior swallowing difficulties per family at bedside. Pt's voice is muffled and she is having difficulty spitting out oral secretions

## 2015-08-06 ENCOUNTER — Ambulatory Visit (INDEPENDENT_AMBULATORY_CARE_PROVIDER_SITE_OTHER): Payer: Medicare Other | Admitting: Internal Medicine

## 2015-08-06 ENCOUNTER — Encounter: Payer: Self-pay | Admitting: Internal Medicine

## 2015-08-06 VITALS — BP 122/86 | HR 97 | Temp 98.2°F | Resp 20 | Ht 68.0 in | Wt 132.6 lb

## 2015-08-06 DIAGNOSIS — N3281 Overactive bladder: Secondary | ICD-10-CM

## 2015-08-06 DIAGNOSIS — Z8673 Personal history of transient ischemic attack (TIA), and cerebral infarction without residual deficits: Secondary | ICD-10-CM

## 2015-08-06 DIAGNOSIS — R1310 Dysphagia, unspecified: Secondary | ICD-10-CM

## 2015-08-06 MED ORDER — MIRABEGRON ER 25 MG PO TB24: 25.0000 mg | ORAL_TABLET | Freq: Every day | ORAL | Status: DC

## 2015-08-06 NOTE — Progress Notes (Signed)
Patient ID: [name], female   DOB: 1925/02/03, 82 y.o.   MRN: [id number]    Location:    [facility]   Place of Service:  OFFICE   Chief Complaint  Patient presents with  . Medical Management of Chronic Issues    TOC visit from ER visit for chocking on food  . OTHER    Two daughter's in room with patient    HPI:  82 yo female seen today for ER f/u 07/31/15 after choking episode at home. She was eating grits at the time. No FB seen. CXR, soft tissue neck xrays neg for FB. She passed RN swallowing eval. No further episodes  OAB - sx's not controlled on oxybutynin BID. She has nocturia and daytime sx's  Afib/HTN - Dr Einar Gip. Takes eliquis. Rate controlled off med. She has hx frequent hypotension.  Hx CVA with left hemiparesis/TIAs - Dr Leonie Man. Takes depakote for sz prophylaxis and migraine.  Memory loss - sees neurology. Has tried aricept but it caused GI upset after 2 doses.. She has been having intermittent auditory hallucinations (music -"silent night", voices) x 1.5 mos. She lives with her 2 daughters   Past Medical History  Diagnosis Date  . Arthritis   . Hypertension   . Migraine   . Colon polyp   . Hearing difficulty   . Torus palatinus   . Atrial flutter ([place]) January, 2012  . Stroke ([place]) 08/02/12     right lenticular nucleus infarct  . Left leg weakness 02/05/2013  . Chronic anticoagulation   . High cholesterol   . Atrial fibrillation ([place])     on Eliquis Rx  . Mild cognitive impairment with memory loss   . Malnutrition ([place])   . TIA (transient ischemic attack)   . Constipation   . Osteoporosis   . Hemorrhoids     Past Surgical History  Procedure Laterality Date  . Cataract extraction w/ intraocular lens  implant, bilateral  2006-2008  . Ganglion cyst excision Bilateral 1938,1954,2003,2005    "wrists/hand" (08/01/2012)  . Cardioversion  05/19/2010    Dr. Einar Gip  . Tonsillectomy  ~ 1935  . Appendectomy  02/19/53    `  . Mouth surgery      [name]    Patient Care  Team: Gildardo Cranker, DO as PCP - General (Internal Medicine) Gildardo Cranker, DO as Consulting Physician (Internal Medicine)  Social History   Social History  . Marital Status: Widowed    Spouse Name: N/A  . Number of Children: 3  . Years of Education: college   Occupational History  . editorial work for [employer]    Social History Main Topics  . Smoking status: Former Research scientist (life sciences)  . Smokeless tobacco: Never Used  . Alcohol Use: No     Comment: 08/01/2012 "glass of wine on special occasions"  . Drug Use: No  . Sexual Activity: No   Other Topics Concern  . Not on file   Social History Narrative   Patient lives at home with daughters.   Caffeine use: 1/2-1 cup of coffee occasionally         Diet: Regular      Do you drink/ eat things with caffeine? Yes      Marital status:   Widowed                            What year were you married ?       Do  you live in a house, apartment,assistred living, condo, trailer, etc.)? House      Is it one or more stories? Yes      How many persons live in your home ? 4      Do you have any pets in your home ?(please list) Yes/ 1 Dog, 2 Cats      Current or past profession: Editor      Do you exercise? No                             Type & how often:      Do you have a living will? Yes      Do you have a DNR form?  Yes                     If not, do you want to discuss one?       Do you have signed POA?HPOA forms? Yes                If so, please bring to your        appointment      regular     reports that she has quit smoking. She has never used smokeless tobacco. She reports that she does not drink alcohol or use illicit drugs.  Allergies  Allergen Reactions  . Aricept [Donepezil Hcl] Hypertension    With N/V  . Tramadol Hcl Other (See Comments)     lethargy, nausea  . Alendronate Sodium Rash and Other (See Comments)    puffy eyes    Medications: Patient's Medications  New Prescriptions   No medications on file    Previous Medications   ACETAMINOPHEN (TYLENOL) 500 MG TABLET    Take 1,000 mg by mouth as needed for mild pain or headache.    B COMPLEX VITAMINS TABLET    Take 1 tablet by mouth 2 (two) times daily.    CALCIUM CARB-CHOLECALCIFEROL (CALCIUM 600 + D) 600-200 MG-UNIT TABS    Take 1 tablet by mouth 2 (two) times daily.   CEPHALEXIN (KEFLEX) 500 MG CAPSULE    Take 1 capsule (500 mg total) by mouth 4 (four) times daily.   CETIRIZINE (ZYRTEC) 10 MG TABLET    Take 10 mg by mouth at bedtime.   CHOLECALCIFEROL (VITAMIN D) 2000 UNITS TABLET    Take 2,000 Units by mouth daily.   DIPHENHYDRAMINE-ACETAMINOPHEN (TYLENOL PM) 25-500 MG TABS    Take 2 tablets by mouth at bedtime as needed (sleep).    DIVALPROEX (DEPAKOTE) 125 MG DR TABLET    Take 2 tablets (250 mg total) by mouth 2 (two) times daily.   DOCUSATE SODIUM (COLACE PO)    Take 100 mg by mouth 2 (two) times daily.    ELIQUIS 2.5 MG TABS TABLET    TAKE 1 TABLET BY MOUTH TWICE DAILY   HYDROCORTISONE (ANUSOL-HC) 2.5 % RECTAL CREAM    Place 1 application rectally as needed for hemorrhoids or itching.   HYDROCORTISONE (ANUSOL-HC) 25 MG SUPPOSITORY    Place 25 mg rectally as needed for hemorrhoids or itching.   MELATONIN 3 MG TABS    Take 1 tablet by mouth at bedtime   MULTIPLE VITAMIN (MULTIVITAMIN WITH MINERALS) TABS    Take 1 tablet by mouth daily.   OLOPATADINE (PATANOL) 0.1 % OPHTHALMIC SOLUTION    Place 1 drop into both eyes 2 (two) times daily.   OMEPRAZOLE (  PRILOSEC) 20 MG CAPSULE    Take 1 capsule (20 mg total) by mouth daily.   ONDANSETRON (ZOFRAN) 4 MG TABLET    Take 1 tablet (4 mg total) by mouth every 6 (six) hours as needed for nausea.   OXYBUTYNIN (DITROPAN) 5 MG TABLET    TAKE 1 TABLET BY MOUTH 2 TIMES DAILY.  Modified Medications   No medications on file  Discontinued Medications   No medications on file    Review of Systems  Unable to perform ROS: Other  cognitive impairment  Filed Vitals:   08/06/15 1543  Pulse: 97  Temp: 98.2  F (36.8 C)  TempSrc: Oral  Resp: 20  Height: '5\' 8"'$  (1.727 m)  Weight: 132 lb 9.6 oz (60.147 kg)  SpO2: 96%   Body mass index is 20.17 kg/(m^2).  Physical Exam  Constitutional: She appears well-developed.  Frail appearing, Sitting in w/c  HENT:  Mouth/Throat: Oropharynx is clear and moist. No oropharyngeal exudate.  Eyes: Pupils are equal, round, and reactive to light. No scleral icterus.  Neck: Neck supple. Carotid bruit is not present. No tracheal deviation present. No thyromegaly present.  Cardiovascular: Normal rate and intact distal pulses.  An irregularly irregular rhythm present. Exam reveals no gallop and no friction rub.   Murmur heard.  Systolic murmur is present with a grade of 1/6  No LE edema b/l. no calf TTP.   Pulmonary/Chest: Effort normal and breath sounds normal. No stridor. No respiratory distress. She has no wheezes. She has no rales.  Abdominal: Soft. Bowel sounds are normal. She exhibits no distension and no mass. There is no hepatomegaly. There is no tenderness. There is no rebound and no guarding.  Musculoskeletal: She exhibits edema and tenderness.  LUE contracture noted at elbow  Lymphadenopathy:    She has no cervical adenopathy.  Neurological: She is alert.  LUE strength 4/5; LLE 3/5; left facial droop  Skin: Skin is warm and dry. No rash noted.  Psychiatric: She has a normal mood and affect. Her behavior is normal.     Labs reviewed: Admission on 07/31/2015, Discharged on 07/31/2015  Component Date Value Ref Range Status  . WBC 07/31/2015 5.1  4.0 - 10.5 K/uL Final  . RBC 07/31/2015 4.12  3.87 - 5.11 MIL/uL Final  . Hemoglobin 07/31/2015 13.0  12.0 - 15.0 g/dL Final  . HCT 07/31/2015 39.3  36.0 - 46.0 % Final  . MCV 07/31/2015 95.4  78.0 - 100.0 fL Final  . MCH 07/31/2015 31.6  26.0 - 34.0 pg Final  . MCHC 07/31/2015 33.1  30.0 - 36.0 g/dL Final  . RDW 07/31/2015 14.0  11.5 - 15.5 % Final  . Platelets 07/31/2015 167  150 - 400 K/uL Final  .  Neutrophils Relative % 07/31/2015 64   Final  . Neutro Abs 07/31/2015 3.3  1.7 - 7.7 K/uL Final  . Lymphocytes Relative 07/31/2015 22   Final  . Lymphs Abs 07/31/2015 1.1  0.7 - 4.0 K/uL Final  . Monocytes Relative 07/31/2015 11   Final  . Monocytes Absolute 07/31/2015 0.5  0.1 - 1.0 K/uL Final  . Eosinophils Relative 07/31/2015 2   Final  . Eosinophils Absolute 07/31/2015 0.1  0.0 - 0.7 K/uL Final  . Basophils Relative 07/31/2015 1   Final  . Basophils Absolute 07/31/2015 0.0  0.0 - 0.1 K/uL Final  . Sodium 07/31/2015 142  135 - 145 mmol/L Final  . Potassium 07/31/2015 4.1  3.5 - 5.1 mmol/L Final  .  Chloride 07/31/2015 106  101 - 111 mmol/L Final  . CO2 07/31/2015 30  22 - 32 mmol/L Final  . Glucose, Bld 07/31/2015 127* 65 - 99 mg/dL Final  . BUN 07/31/2015 20  6 - 20 mg/dL Final  . Creatinine, Ser 07/31/2015 0.88  0.44 - 1.00 mg/dL Final  . Calcium 07/31/2015 8.9  8.9 - 10.3 mg/dL Final  . GFR calc non Af Amer 07/31/2015 57* >60 mL/min Final  . GFR calc Af Amer 07/31/2015 >60  >60 mL/min Final   Comment: (NOTE) The eGFR has been calculated using the CKD EPI equation. This calculation has not been validated in all clinical situations. eGFR's persistently <60 mL/min signify possible Chronic Kidney Disease.   . Anion gap 07/31/2015 6  5 - 15 Final  . Troponin I 07/31/2015 <0.03  <0.031 ng/mL Final   Comment:        NO INDICATION OF MYOCARDIAL INJURY.   Admission on 06/23/2015, Discharged on 06/23/2015  Component Date Value Ref Range Status  . Ammonia 06/23/2015 21  9 - 35 umol/L Final  . Sodium 06/23/2015 144  135 - 145 mmol/L Final  . Potassium 06/23/2015 4.2  3.5 - 5.1 mmol/L Final  . Chloride 06/23/2015 102  101 - 111 mmol/L Final  . BUN 06/23/2015 21* 6 - 20 mg/dL Final  . Creatinine, Ser 06/23/2015 0.90  0.44 - 1.00 mg/dL Final  . Glucose, Bld 06/23/2015 85  65 - 99 mg/dL Final  . Calcium, Ion 06/23/2015 1.22  1.13 - 1.30 mmol/L Final  . TCO2 06/23/2015 31  0 - 100  mmol/L Final  . Hemoglobin 06/23/2015 12.9  12.0 - 15.0 g/dL Final  . HCT 06/23/2015 38.0  36.0 - 46.0 % Final  . Sodium 06/23/2015 143  135 - 145 mmol/L Final  . Potassium 06/23/2015 4.2  3.5 - 5.1 mmol/L Final  . Chloride 06/23/2015 106  101 - 111 mmol/L Final  . CO2 06/23/2015 30  22 - 32 mmol/L Final  . Glucose, Bld 06/23/2015 85  65 - 99 mg/dL Final  . BUN 06/23/2015 16  6 - 20 mg/dL Final  . Creatinine, Ser 06/23/2015 0.96  0.44 - 1.00 mg/dL Final  . Calcium 06/23/2015 9.3  8.9 - 10.3 mg/dL Final  . Total Protein 06/23/2015 5.2* 6.5 - 8.1 g/dL Final  . Albumin 06/23/2015 3.1* 3.5 - 5.0 g/dL Final  . AST 06/23/2015 22  15 - 41 U/L Final  . ALT 06/23/2015 10* 14 - 54 U/L Final  . Alkaline Phosphatase 06/23/2015 48  38 - 126 U/L Final  . Total Bilirubin 06/23/2015 0.4  0.3 - 1.2 mg/dL Final  . GFR calc non Af Amer 06/23/2015 51* >60 mL/min Final  . GFR calc Af Amer 06/23/2015 59* >60 mL/min Final   Comment: (NOTE) The eGFR has been calculated using the CKD EPI equation. This calculation has not been validated in all clinical situations. eGFR's persistently <60 mL/min signify possible Chronic Kidney Disease.   . Anion gap 06/23/2015 7  5 - 15 Final  . WBC 06/23/2015 4.3  4.0 - 10.5 K/uL Final  . RBC 06/23/2015 3.96  3.87 - 5.11 MIL/uL Final  . Hemoglobin 06/23/2015 11.9* 12.0 - 15.0 g/dL Final  . HCT 06/23/2015 37.6  36.0 - 46.0 % Final  . MCV 06/23/2015 94.9  78.0 - 100.0 fL Final  . MCH 06/23/2015 30.1  26.0 - 34.0 pg Final  . MCHC 06/23/2015 31.6  30.0 - 36.0 g/dL Final  .  RDW 06/23/2015 14.7  11.5 - 15.5 % Final  . Platelets 06/23/2015 173  150 - 400 K/uL Final  . Neutrophils Relative % 06/23/2015 53   Final  . Neutro Abs 06/23/2015 2.3  1.7 - 7.7 K/uL Final  . Lymphocytes Relative 06/23/2015 29   Final  . Lymphs Abs 06/23/2015 1.3  0.7 - 4.0 K/uL Final  . Monocytes Relative 06/23/2015 15   Final  . Monocytes Absolute 06/23/2015 0.6  0.1 - 1.0 K/uL Final  . Eosinophils  Relative 06/23/2015 2   Final  . Eosinophils Absolute 06/23/2015 0.1  0.0 - 0.7 K/uL Final  . Basophils Relative 06/23/2015 1   Final  . Basophils Absolute 06/23/2015 0.0  0.0 - 0.1 K/uL Final  . Alcohol, Ethyl (B) 06/23/2015 <5  <5 mg/dL Final   Comment:        LOWEST DETECTABLE LIMIT FOR SERUM ALCOHOL IS 5 mg/dL FOR MEDICAL PURPOSES ONLY   . Lactic Acid, Venous 06/23/2015 0.76  0.5 - 2.0 mmol/L Final  . Color, Urine 06/23/2015 YELLOW  YELLOW Final  . APPearance 06/23/2015 CLOUDY* CLEAR Final  . Specific Gravity, Urine 06/23/2015 1.011  1.005 - 1.030 Final  . pH 06/23/2015 7.0  5.0 - 8.0 Final  . Glucose, UA 06/23/2015 NEGATIVE  NEGATIVE mg/dL Final  . Hgb urine dipstick 06/23/2015 TRACE* NEGATIVE Final  . Bilirubin Urine 06/23/2015 NEGATIVE  NEGATIVE Final  . Ketones, ur 06/23/2015 NEGATIVE  NEGATIVE mg/dL Final  . Protein, ur 89/64/8474 NEGATIVE  NEGATIVE mg/dL Final  . Nitrite 22/94/6392 NEGATIVE  NEGATIVE Final  . Leukocytes, UA 06/23/2015 LARGE* NEGATIVE Final  . Specimen Description 06/23/2015 URINE, RANDOM   Final  . Special Requests 06/23/2015 NONE   Final  . Culture 06/23/2015 MULTIPLE SPECIES PRESENT, SUGGEST RECOLLECTION   Final  . Report Status 06/23/2015 06/25/2015 FINAL   Final  . Glucose-Capillary 06/23/2015 79  65 - 99 mg/dL Final  . Troponin i, poc 06/23/2015 0.02  0.00 - 0.08 ng/mL Final  . Comment 3 06/23/2015          Final   Comment: Due to the release kinetics of cTnI, a negative result within the first hours of the onset of symptoms does not rule out myocardial infarction with certainty. If myocardial infarction is still suspected, repeat the test at appropriate intervals.   . Squamous Epithelial / LPF 06/23/2015 6-30* NONE SEEN Final  . WBC, UA 06/23/2015 TOO NUMEROUS TO COUNT  0 - 5 WBC/hpf Final  . RBC / HPF 06/23/2015 0-5  0 - 5 RBC/hpf Final  . Bacteria, UA 06/23/2015 FEW* NONE SEEN Final  Admission on 05/28/2015, Discharged on 05/28/2015    Component Date Value Ref Range Status  . Prothrombin Time 05/28/2015 14.1  11.6 - 15.2 seconds Final  . INR 05/28/2015 1.07  0.00 - 1.49 Final  . Sodium 05/28/2015 145  135 - 145 mmol/L Final  . Potassium 05/28/2015 4.0  3.5 - 5.1 mmol/L Final  . Chloride 05/28/2015 106  101 - 111 mmol/L Final  . CO2 05/28/2015 27  22 - 32 mmol/L Final  . Glucose, Bld 05/28/2015 94  65 - 99 mg/dL Final  . BUN 05/23/7696 13  6 - 20 mg/dL Final  . Creatinine, Ser 05/28/2015 0.94  0.44 - 1.00 mg/dL Final  . Calcium 64/36/7767 9.1  8.9 - 10.3 mg/dL Final  . Total Protein 05/28/2015 5.8* 6.5 - 8.1 g/dL Final  . Albumin 92/64/9869 3.4* 3.5 - 5.0 g/dL Final  . AST  05/28/2015 22  15 - 41 U/L Final  . ALT 05/28/2015 10* 14 - 54 U/L Final  . Alkaline Phosphatase 05/28/2015 46  38 - 126 U/L Final  . Total Bilirubin 05/28/2015 0.6  0.3 - 1.2 mg/dL Final  . GFR calc non Af Amer 05/28/2015 53* >60 mL/min Final  . GFR calc Af Amer 05/28/2015 >60  >60 mL/min Final   Comment: (NOTE) The eGFR has been calculated using the CKD EPI equation. This calculation has not been validated in all clinical situations. eGFR's persistently <60 mL/min signify possible Chronic Kidney Disease.   . Anion gap 05/28/2015 12  5 - 15 Final  . WBC 05/28/2015 4.3  4.0 - 10.5 K/uL Final  . RBC 05/28/2015 4.26  3.87 - 5.11 MIL/uL Final  . Hemoglobin 05/28/2015 12.8  12.0 - 15.0 g/dL Final  . HCT 05/28/2015 39.5  36.0 - 46.0 % Final  . MCV 05/28/2015 92.7  78.0 - 100.0 fL Final  . MCH 05/28/2015 30.0  26.0 - 34.0 pg Final  . MCHC 05/28/2015 32.4  30.0 - 36.0 g/dL Final  . RDW 05/28/2015 14.6  11.5 - 15.5 % Final  . Platelets 05/28/2015 141* 150 - 400 K/uL Final  . Neutrophils Relative % 05/28/2015 53   Final  . Neutro Abs 05/28/2015 2.3  1.7 - 7.7 K/uL Final  . Lymphocytes Relative 05/28/2015 29   Final  . Lymphs Abs 05/28/2015 1.2  0.7 - 4.0 K/uL Final  . Monocytes Relative 05/28/2015 17   Final  . Monocytes Absolute 05/28/2015 0.7   0.1 - 1.0 K/uL Final  . Eosinophils Relative 05/28/2015 2   Final  . Eosinophils Absolute 05/28/2015 0.1  0.0 - 0.7 K/uL Final  . Basophils Relative 05/28/2015 1   Final  . Basophils Absolute 05/28/2015 0.0  0.0 - 0.1 K/uL Final  . Specimen Description 05/28/2015 URINE, RANDOM   Final  . Special Requests 05/28/2015 NONE   Final  . Culture 05/28/2015 MULTIPLE SPECIES PRESENT, SUGGEST RECOLLECTION   Final  . Report Status 05/28/2015 05/29/2015 FINAL   Final  . Color, Urine 05/28/2015 YELLOW  YELLOW Final  . APPearance 05/28/2015 CLOUDY* CLEAR Final  . Specific Gravity, Urine 05/28/2015 1.012  1.005 - 1.030 Final  . pH 05/28/2015 7.0  5.0 - 8.0 Final  . Glucose, UA 05/28/2015 NEGATIVE  NEGATIVE mg/dL Final  . Hgb urine dipstick 05/28/2015 NEGATIVE  NEGATIVE Final  . Bilirubin Urine 05/28/2015 NEGATIVE  NEGATIVE Final  . Ketones, ur 05/28/2015 NEGATIVE  NEGATIVE mg/dL Final  . Protein, ur 05/28/2015 NEGATIVE  NEGATIVE mg/dL Final  . Nitrite 05/28/2015 NEGATIVE  NEGATIVE Final  . Leukocytes, UA 05/28/2015 LARGE* NEGATIVE Final  . Squamous Epithelial / LPF 05/28/2015 6-30* NONE SEEN Final  . WBC, UA 05/28/2015 TOO NUMEROUS TO COUNT  0 - 5 WBC/hpf Final  . RBC / HPF 05/28/2015 0-5  0 - 5 RBC/hpf Final  . Bacteria, UA 05/28/2015 RARE* NONE SEEN Final    Dg Neck Soft Tissue  07/31/2015  CLINICAL DATA:  Choking this morning.  Pain with swelling. EXAM: NECK SOFT TISSUES - 1+ VIEW COMPARISON:  None. FINDINGS: Degenerative changes are seen in the cervical spine. The soft tissues are unremarkable. The upper lung apices are within normal limits as well. No pneumothorax. No abnormal air within the soft tissues. IMPRESSION: No acute abnormality. Electronically Signed   By: Dorise Bullion III M.D   On: 07/31/2015 12:19   Dg Chest 2 View  07/31/2015  CLINICAL  DATA:  Choking episode this morning eating grits, spasm, pain with swallowing EXAM: CHEST  2 VIEW COMPARISON:  Three/ 27/17 FINDINGS:  Cardiomediastinal silhouette is stable. No acute infiltrate or pleural effusion. No pulmonary edema. Stable left basilar scarring. IMPRESSION: No active cardiopulmonary disease. Electronically Signed   By: Lahoma Crocker M.D.   On: 07/31/2015 12:29     Assessment/Plan   ICD-9-CM ICD-10-CM   1. Dysphagia 787.20 R13.10 Ambulatory referral to Gastroenterology  2. History of CVA (cerebrovascular accident) V12.54 Z86.73 Ambulatory referral to Gastroenterology  3. OAB (overactive bladder) 596.51 N32.81 mirabegron ER (MYRBETRIQ) 25 MG TB24 tablet   STOP oxybutynin  START MYBETRIQ 25 mg daily for overactive bladder  Continue other medications as ordered  Chew food carefully prior to swallowing  Follow up as scheduled  Karrah Mangini S. Perlie Gold  [facility] [address] [place], [place] [address] [phone] Cell (Monday-Friday 8 AM - 5 PM) [phone] After 5 PM and follow prompts

## 2015-08-06 NOTE — Patient Instructions (Signed)
STOP oxybutynin  START MYBETRIQ 25 mg daily for overactive bladder  Continue other medications as ordered  Follow up as scheduled

## 2015-08-07 ENCOUNTER — Observation Stay (HOSPITAL_COMMUNITY): Payer: Medicare Other

## 2015-08-07 ENCOUNTER — Observation Stay (HOSPITAL_BASED_OUTPATIENT_CLINIC_OR_DEPARTMENT_OTHER): Payer: Medicare Other

## 2015-08-07 ENCOUNTER — Inpatient Hospital Stay (HOSPITAL_COMMUNITY)
Admission: EM | Admit: 2015-08-07 | Discharge: 2015-08-12 | DRG: 056 | Disposition: A | Discharge status: Skilled Nursing Facility | Payer: Medicare Other | Attending: Internal Medicine | Admitting: Internal Medicine

## 2015-08-07 ENCOUNTER — Emergency Department (HOSPITAL_COMMUNITY): Payer: Medicare Other

## 2015-08-07 ENCOUNTER — Ambulatory Visit: Payer: Medicare Other | Admitting: Neurology

## 2015-08-07 ENCOUNTER — Encounter (HOSPITAL_COMMUNITY): Payer: Self-pay | Admitting: Emergency Medicine

## 2015-08-07 DIAGNOSIS — R29704 NIHSS score 4: Secondary | ICD-10-CM | POA: Diagnosis present

## 2015-08-07 DIAGNOSIS — R29818 Other symptoms and signs involving the nervous system: Secondary | ICD-10-CM | POA: Diagnosis not present

## 2015-08-07 DIAGNOSIS — T446X5A Adverse effect of alpha-adrenoreceptor antagonists, initial encounter: Secondary | ICD-10-CM | POA: Diagnosis not present

## 2015-08-07 DIAGNOSIS — Z79899 Other long term (current) drug therapy: Secondary | ICD-10-CM

## 2015-08-07 DIAGNOSIS — I4891 Unspecified atrial fibrillation: Secondary | ICD-10-CM | POA: Diagnosis present

## 2015-08-07 DIAGNOSIS — Z886 Allergy status to analgesic agent status: Secondary | ICD-10-CM

## 2015-08-07 DIAGNOSIS — G3184 Mild cognitive impairment, so stated: Secondary | ICD-10-CM | POA: Diagnosis present

## 2015-08-07 DIAGNOSIS — I4892 Unspecified atrial flutter: Secondary | ICD-10-CM | POA: Diagnosis not present

## 2015-08-07 DIAGNOSIS — R55 Syncope and collapse: Secondary | ICD-10-CM | POA: Diagnosis not present

## 2015-08-07 DIAGNOSIS — I639 Cerebral infarction, unspecified: Secondary | ICD-10-CM

## 2015-08-07 DIAGNOSIS — R440 Auditory hallucinations: Secondary | ICD-10-CM | POA: Diagnosis not present

## 2015-08-07 DIAGNOSIS — Z87891 Personal history of nicotine dependence: Secondary | ICD-10-CM

## 2015-08-07 DIAGNOSIS — I69354 Hemiplegia and hemiparesis following cerebral infarction affecting left non-dominant side: Principal | ICD-10-CM

## 2015-08-07 DIAGNOSIS — N3000 Acute cystitis without hematuria: Secondary | ICD-10-CM | POA: Diagnosis present

## 2015-08-07 DIAGNOSIS — E785 Hyperlipidemia, unspecified: Secondary | ICD-10-CM | POA: Diagnosis present

## 2015-08-07 DIAGNOSIS — R350 Frequency of micturition: Secondary | ICD-10-CM | POA: Diagnosis not present

## 2015-08-07 DIAGNOSIS — K219 Gastro-esophageal reflux disease without esophagitis: Secondary | ICD-10-CM | POA: Diagnosis not present

## 2015-08-07 DIAGNOSIS — R4781 Slurred speech: Secondary | ICD-10-CM | POA: Diagnosis present

## 2015-08-07 DIAGNOSIS — Z87440 Personal history of urinary (tract) infections: Secondary | ICD-10-CM

## 2015-08-07 DIAGNOSIS — R112 Nausea with vomiting, unspecified: Secondary | ICD-10-CM | POA: Diagnosis not present

## 2015-08-07 DIAGNOSIS — G9340 Encephalopathy, unspecified: Secondary | ICD-10-CM | POA: Diagnosis present

## 2015-08-07 DIAGNOSIS — G459 Transient cerebral ischemic attack, unspecified: Secondary | ICD-10-CM

## 2015-08-07 DIAGNOSIS — I952 Hypotension due to drugs: Secondary | ICD-10-CM | POA: Diagnosis not present

## 2015-08-07 DIAGNOSIS — G43909 Migraine, unspecified, not intractable, without status migrainosus: Secondary | ICD-10-CM | POA: Diagnosis present

## 2015-08-07 DIAGNOSIS — G458 Other transient cerebral ischemic attacks and related syndromes: Secondary | ICD-10-CM | POA: Diagnosis not present

## 2015-08-07 DIAGNOSIS — M6289 Other specified disorders of muscle: Secondary | ICD-10-CM | POA: Diagnosis not present

## 2015-08-07 DIAGNOSIS — R531 Weakness: Secondary | ICD-10-CM | POA: Diagnosis present

## 2015-08-07 DIAGNOSIS — I10 Essential (primary) hypertension: Secondary | ICD-10-CM

## 2015-08-07 DIAGNOSIS — N3941 Urge incontinence: Secondary | ICD-10-CM | POA: Diagnosis present

## 2015-08-07 DIAGNOSIS — K5900 Constipation, unspecified: Secondary | ICD-10-CM

## 2015-08-07 DIAGNOSIS — I442 Atrioventricular block, complete: Secondary | ICD-10-CM | POA: Diagnosis present

## 2015-08-07 DIAGNOSIS — F0390 Unspecified dementia without behavioral disturbance: Secondary | ICD-10-CM | POA: Diagnosis present

## 2015-08-07 DIAGNOSIS — Z7901 Long term (current) use of anticoagulants: Secondary | ICD-10-CM

## 2015-08-07 DIAGNOSIS — I480 Paroxysmal atrial fibrillation: Secondary | ICD-10-CM | POA: Diagnosis present

## 2015-08-07 DIAGNOSIS — F05 Delirium due to known physiological condition: Secondary | ICD-10-CM | POA: Diagnosis not present

## 2015-08-07 DIAGNOSIS — M810 Age-related osteoporosis without current pathological fracture: Secondary | ICD-10-CM | POA: Diagnosis present

## 2015-08-07 DIAGNOSIS — I6789 Other cerebrovascular disease: Secondary | ICD-10-CM | POA: Diagnosis not present

## 2015-08-07 DIAGNOSIS — Z888 Allergy status to other drugs, medicaments and biological substances status: Secondary | ICD-10-CM

## 2015-08-07 LAB — PROTIME-INR
INR: 1.13 (ref 0.00–1.49)
Prothrombin Time: 14.7 seconds (ref 11.6–15.2)

## 2015-08-07 LAB — CBC
HCT: 36.3 % (ref 36.0–46.0)
HEMATOCRIT: 39 % (ref 36.0–46.0)
HEMOGLOBIN: 12.7 g/dL (ref 12.0–15.0)
HEMOGLOBIN: 11.8 g/dL — AB (ref 12.0–15.0)
MCH: 31.2 pg (ref 26.0–34.0)
MCH: 30.9 pg (ref 26.0–34.0)
MCHC: 32.6 g/dL (ref 30.0–36.0)
MCHC: 32.5 g/dL (ref 30.0–36.0)
MCV: 95.8 fL (ref 78.0–100.0)
MCV: 95 fL (ref 78.0–100.0)
PLATELETS: 155 10*3/uL (ref 150–400)
Platelets: 177 10*3/uL (ref 150–400)
RBC: 4.07 MIL/uL (ref 3.87–5.11)
RBC: 3.82 MIL/uL — ABNORMAL LOW (ref 3.87–5.11)
RDW: 14.5 % (ref 11.5–15.5)
RDW: 14.6 % (ref 11.5–15.5)
WBC: 5 10*3/uL (ref 4.0–10.5)
WBC: 4.3 10*3/uL (ref 4.0–10.5)

## 2015-08-07 LAB — BASIC METABOLIC PANEL
ANION GAP: 10 (ref 5–15)
BUN: 15 mg/dL (ref 6–20)
CALCIUM: 9.1 mg/dL (ref 8.9–10.3)
CHLORIDE: 108 mmol/L (ref 101–111)
CO2: 26 mmol/L (ref 22–32)
Creatinine, Ser: 0.8 mg/dL (ref 0.44–1.00)
Glucose, Bld: 101 mg/dL — ABNORMAL HIGH (ref 65–99)
POTASSIUM: 3.9 mmol/L (ref 3.5–5.1)
SODIUM: 144 mmol/L (ref 135–145)

## 2015-08-07 LAB — I-STAT CHEM 8, ED
BUN: 24 mg/dL — AB (ref 6–20)
CALCIUM ION: 1.18 mmol/L (ref 1.13–1.30)
Chloride: 107 mmol/L (ref 101–111)
Creatinine, Ser: 1 mg/dL (ref 0.44–1.00)
Glucose, Bld: 96 mg/dL (ref 65–99)
HEMATOCRIT: 42 % (ref 36.0–46.0)
Hemoglobin: 14.3 g/dL (ref 12.0–15.0)
Potassium: 4.2 mmol/L (ref 3.5–5.1)
SODIUM: 145 mmol/L (ref 135–145)
TCO2: 28 mmol/L (ref 0–100)

## 2015-08-07 LAB — LIPID PANEL
CHOL/HDL RATIO: 3 ratio
CHOLESTEROL: 139 mg/dL (ref 0–200)
HDL: 47 mg/dL (ref >40)
LDL Cholesterol: 82 mg/dL (ref 0–99)
TRIGLYCERIDES: 50 mg/dL (ref <150)
VLDL: 10 mg/dL (ref 0–40)

## 2015-08-07 LAB — COMPREHENSIVE METABOLIC PANEL
ALK PHOS: 52 U/L (ref 38–126)
ALT: 11 U/L — AB (ref 14–54)
AST: 24 U/L (ref 15–41)
Albumin: 3.5 g/dL (ref 3.5–5.0)
Anion gap: 10 (ref 5–15)
BILIRUBIN TOTAL: 0.5 mg/dL (ref 0.3–1.2)
BUN: 19 mg/dL (ref 6–20)
CALCIUM: 9.9 mg/dL (ref 8.9–10.3)
CHLORIDE: 108 mmol/L (ref 101–111)
CO2: 27 mmol/L (ref 22–32)
CREATININE: 0.99 mg/dL (ref 0.44–1.00)
GFR, EST AFRICAN AMERICAN: 57 mL/min — AB (ref >60)
GFR, EST NON AFRICAN AMERICAN: 49 mL/min — AB (ref >60)
Glucose, Bld: 104 mg/dL — ABNORMAL HIGH (ref 65–99)
Potassium: 4.3 mmol/L (ref 3.5–5.1)
Sodium: 145 mmol/L (ref 135–145)
Total Protein: 6.1 g/dL — ABNORMAL LOW (ref 6.5–8.1)

## 2015-08-07 LAB — I-STAT TROPONIN, ED: Troponin i, poc: 0.01 ng/mL (ref 0.00–0.08)

## 2015-08-07 LAB — CBG MONITORING, ED: GLUCOSE-CAPILLARY: 102 mg/dL — AB (ref 65–99)

## 2015-08-07 LAB — URINALYSIS, ROUTINE W REFLEX MICROSCOPIC
BILIRUBIN URINE: NEGATIVE
Glucose, UA: NEGATIVE mg/dL
Hgb urine dipstick: NEGATIVE
KETONES UR: NEGATIVE mg/dL
NITRITE: NEGATIVE
PH: 6 (ref 5.0–8.0)
PROTEIN: NEGATIVE mg/dL
Specific Gravity, Urine: 1.023 (ref 1.005–1.030)

## 2015-08-07 LAB — DIFFERENTIAL
BASOS PCT: 1 %
Basophils Absolute: 0 10*3/uL (ref 0.0–0.1)
Eosinophils Absolute: 0.1 10*3/uL (ref 0.0–0.7)
Eosinophils Relative: 2 %
LYMPHS ABS: 1.5 10*3/uL (ref 0.7–4.0)
Lymphocytes Relative: 29 %
MONO ABS: 0.8 10*3/uL (ref 0.1–1.0)
MONOS PCT: 15 %
NEUTROS ABS: 2.6 10*3/uL (ref 1.7–7.7)
Neutrophils Relative %: 53 %

## 2015-08-07 LAB — URINE MICROSCOPIC-ADD ON: RBC / HPF: NONE SEEN RBC/hpf (ref 0–5)

## 2015-08-07 LAB — TSH: TSH: 0.301 u[IU]/mL — ABNORMAL LOW (ref 0.350–4.500)

## 2015-08-07 LAB — T4, FREE: Free T4: 1.23 ng/dL — ABNORMAL HIGH (ref 0.61–1.12)

## 2015-08-07 LAB — ECHOCARDIOGRAM COMPLETE: Height: 66 in

## 2015-08-07 LAB — MAGNESIUM: Magnesium: 2 mg/dL (ref 1.7–2.4)

## 2015-08-07 LAB — APTT: aPTT: 28 seconds (ref 24–37)

## 2015-08-07 MED ORDER — ATORVASTATIN CALCIUM 10 MG PO TABS
10.0000 mg | ORAL_TABLET | Freq: Every day | ORAL | Status: DC
  Administered 2015-08-08 – 2015-08-11 (×2): 10 mg via ORAL
  Filled 2015-08-07 (×5): qty 1

## 2015-08-07 MED ORDER — DEXTROSE 5 % IV SOLN
1.0000 g | Freq: Once | INTRAVENOUS | Status: AC
  Administered 2015-08-07: 1 g via INTRAVENOUS
  Filled 2015-08-07: qty 10

## 2015-08-07 MED ORDER — ACETAMINOPHEN 650 MG RE SUPP: 650.0000 mg | RECTAL | Status: DC | PRN

## 2015-08-07 MED ORDER — HYDROCORTISONE ACETATE 25 MG RE SUPP: 25.0000 mg | RECTAL | Status: DC | PRN

## 2015-08-07 MED ORDER — DIVALPROEX SODIUM 250 MG PO DR TAB
250.0000 mg | DELAYED_RELEASE_TABLET | Freq: Two times a day (BID) | ORAL | Status: DC
  Administered 2015-08-07 – 2015-08-12 (×10): 250 mg via ORAL
  Filled 2015-08-07 (×11): qty 1

## 2015-08-07 MED ORDER — DIPHENHYDRAMINE HCL 25 MG PO CAPS
25.0000 mg | ORAL_CAPSULE | Freq: Four times a day (QID) | ORAL | Status: DC | PRN
  Administered 2015-08-07: 25 mg via ORAL
  Filled 2015-08-07: qty 1

## 2015-08-07 MED ORDER — APIXABAN 2.5 MG PO TABS
2.5000 mg | ORAL_TABLET | Freq: Two times a day (BID) | ORAL | Status: DC
  Administered 2015-08-07 – 2015-08-12 (×10): 2.5 mg via ORAL
  Filled 2015-08-07 (×11): qty 1

## 2015-08-07 MED ORDER — SODIUM CHLORIDE 0.9 % IV SOLN
INTRAVENOUS | Status: DC
  Administered 2015-08-07 (×2): via INTRAVENOUS

## 2015-08-07 MED ORDER — ACETAMINOPHEN 325 MG PO TABS
650.0000 mg | ORAL_TABLET | ORAL | Status: DC | PRN
  Administered 2015-08-07 – 2015-08-12 (×4): 650 mg via ORAL
  Filled 2015-08-07 (×4): qty 2

## 2015-08-07 MED ORDER — MIRABEGRON ER 25 MG PO TB24
25.0000 mg | ORAL_TABLET | Freq: Every day | ORAL | Status: DC
  Administered 2015-08-07 – 2015-08-08 (×2): 25 mg via ORAL
  Filled 2015-08-07 (×2): qty 1

## 2015-08-07 MED ORDER — SENNOSIDES-DOCUSATE SODIUM 8.6-50 MG PO TABS: 1.0000 | ORAL_TABLET | Freq: Every evening | ORAL | Status: DC | PRN

## 2015-08-07 MED ORDER — STROKE: EARLY STAGES OF RECOVERY BOOK
Freq: Once | Status: AC
  Administered 2015-08-07: 1
  Filled 2015-08-07: qty 1

## 2015-08-07 NOTE — Progress Notes (Signed)
ANTICOAGULATION CONSULT NOTE - Initial Consult  Pharmacy Consult for Apixaban Indication: atrial fibrillation  Allergies  Allergen Reactions  . Aricept [Donepezil Hcl] Hypertension    With N/V  . Tramadol Hcl Other (See Comments)     lethargy, nausea  . Alendronate Sodium Rash and Other (See Comments)    puffy eyes    Patient Measurements: Height: 5\' 6"  (167.6 cm) IBW/kg (Calculated) : 59.3 Heparin Dosing Weight:   Vital Signs: Temp: 98.6 F (37 C) (05/11 1627) Temp Source: Oral (05/11 1627) BP: 178/81 mmHg (05/11 1627) Pulse Rate: 80 (05/11 1627)  Labs:  Recent Labs  08/07/15 0135 08/07/15 0203 08/07/15 0551  HGB 12.7 14.3 11.8*  HCT 39.0 42.0 36.3  PLT 177  --  155  APTT 28  --   --   LABPROT 14.7  --   --   INR 1.13  --   --   CREATININE 0.99 1.00 0.80    Estimated Creatinine Clearance: 45.5 mL/min (by C-G formula based on Cr of 0.8).   Medical History: Past Medical History  Diagnosis Date  . Arthritis   . Hypertension   . Migraine   . Colon polyp   . Hearing difficulty   . Torus palatinus   . Atrial flutter ([place]) January, 2012  . Stroke ([place]) 08/02/12     right lenticular nucleus infarct  . Left leg weakness 02/05/2013  . Chronic anticoagulation   . High cholesterol   . Atrial fibrillation ([place])     on Eliquis Rx  . Mild cognitive impairment with memory loss   . Malnutrition ([place])   . TIA (transient ischemic attack)   . Constipation   . Osteoporosis   . Hemorrhoids     Medications:  Prescriptions prior to admission  Medication Sig Dispense Refill Last Dose  . acetaminophen (TYLENOL) 500 MG tablet Take 1,000 mg by mouth as needed for mild pain or headache.    Taking  . b complex vitamins tablet Take 1 tablet by mouth 2 (two) times daily.    Taking  . Calcium Carb-Cholecalciferol (CALCIUM 600 + D) 600-200 MG-UNIT TABS Take 1 tablet by mouth 2 (two) times daily.   Taking  . cetirizine (ZYRTEC) 10 MG tablet Take 10 mg by mouth at bedtime.    Taking  . Cholecalciferol (VITAMIN D) 2000 UNITS tablet Take 2,000 Units by mouth daily.   Taking  . diphenhydramine-acetaminophen (TYLENOL PM) 25-500 MG TABS Take 2 tablets by mouth at bedtime as needed (sleep).    Taking  . divalproex (DEPAKOTE) 125 MG DR tablet Take 2 tablets (250 mg total) by mouth 2 (two) times daily. 540 tablet 1 Taking  . Docusate Sodium (COLACE PO) Take 100 mg by mouth 2 (two) times daily.    Taking  . ELIQUIS 2.5 MG TABS tablet TAKE 1 TABLET BY MOUTH TWICE DAILY 180 tablet 1 Taking  . hydrocortisone (ANUSOL-HC) 2.5 % rectal cream Place 1 application rectally as needed for hemorrhoids or itching.   Taking  . hydrocortisone (ANUSOL-HC) 25 MG suppository Place 25 mg rectally as needed for hemorrhoids or itching.   Taking  . Melatonin 3 MG TABS Take 1 tablet by mouth at bedtime   Taking  . mirabegron ER (MYRBETRIQ) 25 MG TB24 tablet Take 1 tablet (25 mg total) by mouth daily. 30 tablet 3   . Multiple Vitamin (MULTIVITAMIN WITH MINERALS) TABS Take 1 tablet by mouth daily.   Taking  . olopatadine (PATANOL) 0.1 % ophthalmic solution Place 1  drop into both eyes 2 (two) times daily. 5 mL 1 Taking  . ondansetron (ZOFRAN) 4 MG tablet Take 1 tablet (4 mg total) by mouth every 6 (six) hours as needed for nausea. 10 tablet 0 Taking   Scheduled:  . atorvastatin  10 mg Oral q1800  . divalproex  250 mg Oral BID  . mirabegron ER  25 mg Oral Daily   Infusions:    Assessment: 82yo female with a history of Afib on apixaban PTA presented with L-sided weakness. Pharmacy is consulted to dose apixaban for atrial fibrillation. Weight is 60 kg and sCr < 1.5 with age > 70.  Goal of Therapy:  Monitor platelets by anticoagulation protocol: Yes   Plan:  Apixaban 2.5mg  BID Monitor s/sx of bleeding  Andrey Cota. Diona Foley, PharmD, [place] Clinical Pharmacist Pager [phone] 08/07/2015,5:26 PM

## 2015-08-07 NOTE — H&P (Signed)
History and Physical  Patient Name: [name]     FTZ:[id number]    DOB: October 17, 1924    DOA: 08/07/2015 PCP: Kirt Boys, DO   Patient coming from: Home     Chief Complaint: Left sided weakness and slurred speech acutely  HPI: [name] is a 82 y.o. female with a past medical history significant for HTN, A. fib on Eliquis, migraines on Depakote, CVA with residual left-sided weakness, mild dementia who presents with slurred speech and acute severe left-sided weakness.  The patient was completely normal state of health until around 12:30 tonight shortly after midnight, when she got up to go to the bathroom. She lives with her daughters, who assist her with ambulation. They were able to get her to standing, and she started to take a few steps, and then they felt that she was collapsing, and they lowered her to the ground. At that time she stated that she had what seemed like slurred speech, and answered every question with "I can't move". They called 911, vomiting patient with left leg weakness and slurred speech and brought her to the ER.  In the ED, her slurred speech had resolved, and she had mild left-sided weakness,? Increased from baseline LLE weakness.m  Afebrile, hemodynamically stable, hypertensive, SpO2 normal.  Na 145, K 4.3, Cr 1.0, LFT normal, WBC 5K, Hgb 12, coags normal, TNI normal, CT head unremarkable, ECG no changes.  CODE STROKE had been initiated in the field, but patient was not a tPA candidate due to rapidly improving symptoms.  Neurology was involved and recommended observation and PT/OT eval, MR brain, and so [facility] were approached for admission.     Review of Systems:  Pt complains of nothing.  Family note increased transient left-sided weakness versus bilateral lower extremity weakness, slurred speech, confusion. Pt denies any headache, slurred speech currently, focal weakness or change from baseline currently, dysuria, urinary frequency change, urinary urgency  change, change in urine character, recent fever, vomiting, diarrhea, cough.  All other systems negative except as just noted or noted in the history of present illness.  Past Medical History  Diagnosis Date  . Arthritis   . Hypertension   . Migraine   . Colon polyp   . Hearing difficulty   . Torus palatinus   . Atrial flutter (HCC) January, 2012  . Stroke (HCC) 08/02/12     right lenticular nucleus infarct  . Left leg weakness 02/05/2013  . Chronic anticoagulation   . High cholesterol   . Atrial fibrillation (HCC)     on Eliquis Rx  . Mild cognitive impairment with memory loss   . Malnutrition (HCC)   . TIA (transient ischemic attack)   . Constipation   . Osteoporosis   . Hemorrhoids     Past Surgical History  Procedure Laterality Date  . Cataract extraction w/ intraocular lens  implant, bilateral  2006-2008  . Ganglion cyst excision Bilateral 1938,1954,2003,2005    "wrists/hand" (08/01/2012)  . Cardioversion  05/19/2010    Dr. Jacinto Halim  . Tonsillectomy  ~ 1935  . Appendectomy  02/19/53    `  . Mouth surgery      [name]    Social History: Patient lives with HER-2 daughters.  Patient walks with a walker, she cannot walk unassisted.  She is from [place] originally, she was admitted at the proceedings of the [facility] for many years. She is not a smoker. No alcohol.    Allergies  Allergen Reactions  . Aricept [Donepezil Hcl]  Hypertension    With N/V  . Tramadol Hcl Other (See Comments)     lethargy, nausea  . Alendronate Sodium Rash and Other (See Comments)    puffy eyes    Family history: family history includes Aneurysm in her mother; Cancer in her mother; Colon cancer in her father; Stroke in her mother. There is no history of Heart attack, Diabetes, or Hypertension.  Prior to Admission medications   Medication Sig Start Date End Date Taking? Authorizing Provider  acetaminophen (TYLENOL) 500 MG tablet Take 1,000 mg by mouth as needed for mild pain or headache.      Historical Provider, MD  b complex vitamins tablet Take 1 tablet by mouth 2 (two) times daily.     Historical Provider, MD  Calcium Carb-Cholecalciferol (CALCIUM 600 + D) 600-200 MG-UNIT TABS Take 1 tablet by mouth 2 (two) times daily.    Historical Provider, MD  cetirizine (ZYRTEC) 10 MG tablet Take 10 mg by mouth at bedtime.    Historical Provider, MD  Cholecalciferol (VITAMIN D) 2000 UNITS tablet Take 2,000 Units by mouth daily.    Historical Provider, MD  diphenhydramine-acetaminophen (TYLENOL PM) 25-500 MG TABS Take 2 tablets by mouth at bedtime as needed (sleep).     Historical Provider, MD  divalproex (DEPAKOTE) 125 MG DR tablet Take 2 tablets (250 mg total) by mouth 2 (two) times daily. 07/25/14   Garvin Fila, MD  Docusate Sodium (COLACE PO) Take 100 mg by mouth 2 (two) times daily.     Historical Provider, MD  ELIQUIS 2.5 MG TABS tablet TAKE 1 TABLET BY MOUTH TWICE DAILY 03/25/15   Garvin Fila, MD  hydrocortisone (ANUSOL-HC) 2.5 % rectal cream Place 1 application rectally as needed for hemorrhoids or itching.    Historical Provider, MD  hydrocortisone (ANUSOL-HC) 25 MG suppository Place 25 mg rectally as needed for hemorrhoids or itching.    Historical Provider, MD  Melatonin 3 MG TABS Take 1 tablet by mouth at bedtime    Historical Provider, MD  mirabegron ER (MYRBETRIQ) 25 MG TB24 tablet Take 1 tablet (25 mg total) by mouth daily. 08/06/15   Gildardo Cranker, DO  Multiple Vitamin (MULTIVITAMIN WITH MINERALS) TABS Take 1 tablet by mouth daily.    Historical Provider, MD  olopatadine (PATANOL) 0.1 % ophthalmic solution Place 1 drop into both eyes 2 (two) times daily. 06/11/15   Monica Carter, DO  ondansetron (ZOFRAN) 4 MG tablet Take 1 tablet (4 mg total) by mouth every 6 (six) hours as needed for nausea. 10/31/14   Delfina Redwood, MD     Physical Exam: BP 149/70 mmHg  Pulse 76  Temp(Src) 98.4 F (36.9 C) (Oral)  Resp 16  SpO2 96% General appearance: Frail, elderly adult female,  alert and in no acute distress.  Conversational, hard of hearing.   Eyes: Anicteric, conjunctiva pink, lids and lashes normal.     ENT: No nasal deformity, discharge, or epistaxis.  OP moist without lesions.   Lymph: No cervical, supraclavicular or axillary lymphadenopathy. Skin: Warm and dry.  No jaundice.  No suspicious rashes or lesions. Cardiac: RRR, nl S1-S2, no murmurs appreciated.  Capillary refill is brisk.  JVP normal.  No LE edema.  Radial and DP pulses 2+ and symmetric. Respiratory: Normal respiratory rate and rhythm.  CTAB without rales or wheezes. Abdomen: Abdomen soft without rigidity.  Minimal RLQ TTP without gaurding or rebound. No ascites, distension.   MSK: No deformities or effusions. Neuro: Pupils are 4 mm  and reactive to 3 mm. Extraocular movements are intact, without nystagmus. Cranial nerve 5 is within normal limits. Cranial nerve 7 is symmetrical. Cranial nerve 8 is within normal limits. Cranial nerves 9 and 10 reveal equal palate elevation. Cranial nerve 11 reveals sternocleidomastoid strong. Cranial nerve 12 is midline. I note a deficit in motor strength testing in the upper and lower extremities on the left, probably 4/5 both, compared with 5/5 in RIGHT.  Finger-to-nose testing is symmetric to me. The patient is oriented to situation.  She is oriented to "[facility]" and "[place]" but states year is "56...".  Oriented to "May".  Speech is fluent. Naming is grossly intact. Attention span and concentration are within normal limits.   Psych: Behavior appropriate.  Affect normal, pleasant.  No evidence of aural or visual hallucinations or delusions.       Labs on Admission:  The metabolic panel shows normal electrolytes and renal function, mildly elevated BUN to creatinine ratio. The complete blood count shows no leukocytosis, anemia, thrombocytopenia. Coags normal. Troponin negative. Urinalysis with pyuria bacteria.   Radiological Exams on Admission: CT head  without contrast 08/07/2015  No acute bleed    EKG: Independently reviewed. Rate 77, atrial flutter with constant block.  QTc 455. No changes from previous, no definite ST changes.    Assessment/Plan 1. Weakness suspect TIA vs stroke:  This is new. MRI brain pending.  -Admit to telemetry -Neuro checks, NIHSS per protocol -SLP consult given failed swallow screen -Permissive hypertension for now -Lipids, hemoglobin A1c -Had CT angio recently 06/23/15 of head and neck, will defer carotid dopplers for now -MRA last September showed diffuse intracranial atherosclerosis -Echocardiogram ordered -PT/OT consultation -Consult to Neurology   2. Migraines:  -Restart depakote when cleared to swallow  3. HTN:  Controlled at home without antihypertensives -Monitor for now, permissive hypertension okay  4. Urge incontinence:  Stable. Was to start mirabegron today. -Start mirabegron when cleared to swallow  5. Aflutter:  Stable. CHADS2Vasc 6. On Eliquis (last dose PM 5/10) -Restart Eliquis when cleared to swallow      DVT prophylaxis: Eliquis when able   Code Status: Do not intubate, CPR/defib okay  Family Communication: Daughters at bedside, partial code CODE STATUS specifically confirmed.  Disposition Plan: Anticipate Stroke work up as above and consult to ancillary services.  Expect discharge within 1-2 days, pending MRI results. Consults called: Neurology, Dr. Nicole Kindred has seen patient. Admission status: Telemetry, OBS status   Medical decision making: Patient seen at 4:45 AM on 08/07/2015.  The patient was discussed with Dr. Rex Kras. Clinical condition: stable.       Edwin Dada [facility] Pager [phone]

## 2015-08-07 NOTE — Care Management Note (Signed)
Case Management Note  Patient Details  Name: ALYXIA CENTEIO MRN: [id number] Date of Birth: 06-26-1924  Subjective/Objective:  Pt in to r/o CVA. Awaiting MRI. She is from home with her family.                  Action/Plan: Awaiting PT/OT recs. CM following for discharge disposition.   Expected Discharge Date:                  Expected Discharge Plan:     In-House Referral:     Discharge planning Services     Post Acute Care Choice:    Choice offered to:     DME Arranged:    DME Agency:     HH Arranged:    HH Agency:     Status of Service:  In process, will continue to follow  Medicare Important Message Given:    Date Medicare IM Given:    Medicare IM give by:    Date Additional Medicare IM Given:    Additional Medicare Important Message give by:     If discussed at [place] of Stay Meetings, dates discussed:    Additional Comments:  Pollie Friar, RN 08/07/2015, 11:03 AM

## 2015-08-07 NOTE — Progress Notes (Signed)
Arrived from ED. Alert and oriented to person, place. Denies any pain. Family at bedside. Oriented to room . Call light within reach.

## 2015-08-07 NOTE — Progress Notes (Signed)
Left side of pt face appears slightly swollen and pink in color but symmetrical when assessed. Pt denies any numbness or tingling; MD notified and ordered to continue monitor pt along with benadryl. Pt daughters refused ordered Lipitor for pt stating "it gave mother abdominal craps the last time". Pt daughter was educated on the need for the statin medication but they replied they now and understand but not all medication "sits well with mother". Medication held and not given. Will continue to monitor pt. Delia Heady RN

## 2015-08-07 NOTE — Progress Notes (Signed)
  Echocardiogram 2D Echocardiogram has been performed.  [name] 08/07/2015, 11:50 AM

## 2015-08-07 NOTE — ED Notes (Signed)
Patient arrives Code stroke to ER at [facility] [place]. Left sided weakness reported worse than previous left side weakness from old stroke. Daughter stated to EMS she had slurred speech not present before. Patient resides with daughter. ER MD and neuro at bedside. Patient went straight to CT scanner. NIH scale performed per MD at 0200.

## 2015-08-07 NOTE — ED Notes (Signed)
Voided on the bedpan.

## 2015-08-07 NOTE — Progress Notes (Signed)
VASCULAR LAB PRELIMINARY  PRELIMINARY  PRELIMINARY  PRELIMINARY  Carotid duplex completed.    Preliminary report:  1-39% ICA plaquing.  Vertebral artery flow antegrade.   Annelyse Rey, RVT 08/07/2015, 10:56 AM

## 2015-08-07 NOTE — Progress Notes (Signed)
STROKE TEAM PROGRESS NOTE   HISTORY OF PRESENT ILLNESS [name] is an 82 y.o. female history of atrial fibrillation on Eliquis, hypertension, previous cerebral stroke with left-sided residual weakness and hyperlipidemia, brought to the emergency room and code stroke status following the acute onset of worsening of left-sided weakness as well as slurred speech. Patient was last known well at 12 AM today 08/07/2015. She reportedly fell when she comes out of bed at 1245. Deficits of subsequently improved. Slurred speech has resolved. CT scan of the head showed no acute intracranial abnormality. She still hadn't motor and sensory changes on the left as well as mild left facial weakness. NIH stroke score was 4. Patient was not administered IV t-PA secondary to on anticoagulation with Eliquis. She was admitted for further evaluation and treatment.   SUBJECTIVE (INTERVAL HISTORY) Two of her daughters are at the bedside.  Overall she feels her condition is stable. Her strength at right UE and LE seems back to baseline. She received rocephin for UTI treatment as UA WBC TNTC. Daughters stated that she was just treated with Abx for UTI about two weeks ago. Of note, pt has been admitted several times in the past with UTI and worsening right sided weakness and MRI negative for acute stroke. She has been following with Dr. Leonie Man in clinic.    OBJECTIVE Temp:  [98.2 F (36.8 C)-98.4 F (36.9 C)] 98.2 F (36.8 C) (05/11 0830) Pulse Rate:  [75-97] 75 (05/11 0830) Cardiac Rhythm:  [-] Normal sinus rhythm;Bundle branch block;Heart block (05/11 0453) Resp:  [12-20] 16 (05/11 0830) BP: (111-176)/(69-91) 150/83 mmHg (05/11 0830) SpO2:  [96 %-98 %] 97 % (05/11 0830) Weight:  [60.147 kg (132 lb 9.6 oz)] 60.147 kg (132 lb 9.6 oz) (05/10 1543)  CBC:  Recent Labs Lab 08/07/15 0135 08/07/15 0203 08/07/15 0551  WBC 5.0  --  4.3  NEUTROABS 2.6  --   --   HGB 12.7 14.3 11.8*  HCT 39.0 42.0 36.3  MCV 95.8  --   95.0  PLT 177  --  99991111    Basic Metabolic Panel:  Recent Labs Lab 08/07/15 0135 08/07/15 0203 08/07/15 0551  NA 145 145 144  K 4.3 4.2 3.9  CL 108 107 108  CO2 27  --  26  GLUCOSE 104* 96 101*  BUN 19 24* 15  CREATININE 0.99 1.00 0.80  CALCIUM 9.9  --  9.1  MG  --   --  2.0    Lipid Panel:    Component Value Date/Time   CHOL 139 08/07/2015 0551   TRIG 50 08/07/2015 0551   HDL 47 08/07/2015 0551   CHOLHDL 3.0 08/07/2015 0551   VLDL 10 08/07/2015 0551   LDLCALC 82 08/07/2015 0551   HgbA1c:  Lab Results  Component Value Date   HGBA1C 6.2* 12/19/2014   Urine Drug Screen:    Component Value Date/Time   LABOPIA NONE DETECTED 12/18/2014 0225   COCAINSCRNUR NONE DETECTED 12/18/2014 0225   LABBENZ NONE DETECTED 12/18/2014 0225   AMPHETMU NONE DETECTED 12/18/2014 0225   THCU NONE DETECTED 12/18/2014 0225   LABBARB NONE DETECTED 12/18/2014 0225      IMAGING  Ct Head Wo Contrast  08/07/2015  IMPRESSION: No acute intracranial hemorrhage. Age-related atrophy and chronic microvascular ischemic disease. Left occipital old infarct and encephalomalacia. If symptoms persist and there are no contraindications, MRI may provide better evaluation if clinically indicated.   Carotid Doppler   There is 1-39% bilateral ICA stenosis.  Vertebral artery flow is antegrade.    2D Echocardiogram  pending   MRI and MRA pending   PHYSICAL EXAM  Temp:  [98.2 F (36.8 C)-98.8 F (37.1 C)] 98.6 F (37 C) (05/11 1627) Pulse Rate:  [75-86] 80 (05/11 1627) Resp:  [12-20] 16 (05/11 1627) BP: (111-178)/(64-92) 178/81 mmHg (05/11 1627) SpO2:  [95 %-98 %] 96 % (05/11 1627)  General - Well nourished, well developed, in no apparent distress.  Ophthalmologic - Fundi not visualized due to noncooperation.  Cardiovascular - Regular rate and rhythm with no murmur, not in afib.  Mental Status -  Level of arousal and orientation to month, place, and person were intact, not orientated to  year. Language including expression, naming, repetition, comprehension was assessed and found intact. Fund of Knowledge was assessed and was impaired.  Cranial Nerves II - XII - II - Visual field intact OU. III, IV, VI - Extraocular movements intact. V - Facial sensation intact bilaterally. VII - Facial movement intact bilaterally VIII - Hearing & vestibular intact bilaterally. X - Palate elevates symmetrically. XI - Chin turning & shoulder shrug intact bilaterally. XII - Tongue protrusion intact.  Motor Strength - The patient's strength was 5-/5 LUE and LLE, and 5/5 RUE and RLE and pronator drift was absent.  Bulk was normal and fasciculations were absent.   Motor Tone - Muscle tone was assessed at the neck and appendages and was normal.  Reflexes - The patient's reflexes were 1+ in all extremities and she had no pathological reflexes.  Sensory - Light touch, temperature/pinprick were assessed and were symmetrical.    Coordination - The patient had normal movements in the hands with no ataxia or dysmetria.  Tremor was absent.  Gait and Station - not tested due to safety concerns   ASSESSMENT/PLAN [name] is a 82 y.o. female with history of atrial fibrillation on Eliquis, hypertension, previous cerebral stroke with left-sided residual weakness and hyperlipidemia presenting with slurred speech and worsening of left-sided weakness. She did not receive IV t-PA due to anticoagulation on Eliquis.   Stroke vs recrudesce of previous stroke  Resultant - back to baseline weakness  MRI  pending   MRA  pending   Carotid Doppler  No significant stenosis   2D Echo  pending   LDL 82  HgbA1c pending  SCDs added for VTE prophylaxis as currently off eliquis Diet NPO time specified  Eliquis (apixaban) daily prior to admission, now resumption of eliquis. Continue eliquis on discharge.  Ongoing aggressive stroke risk factor management  Therapy recommendations:  pending    Disposition:  pending  (lives with 2 daughtes)  Followed by Dr. Leonie Man as an OP in two months  Atrial Flutter  Home anticoagulation:  Eliquis (apixaban) daily   eliquis resumed   Follow up with Dr. Einar Gip as outpt  Hx stroke/TIA/recrudescence of stroke  01/2015 seen by DR. Sethi as an OP, stroke prevention, continue Eliquis  11/2014 MRI neg. B ACA stenosis  08/2013 MRI neg. MRA with atherosclerosis  02/2013 L occipital temporal subcortical infarct. ? Subacute hemorrhage secondary fall in Nov  01/2013 MRI x 2 neg. R ACA stenosis  07/2012 R corona radiata infarct. MRA neg  Hypertension  Stable  BP goal normotensive  Hyperlipidemia  Home meds:  No statin  LDL 82, goal < 70  hx hyperlipidemia, on lipitor in the past  Restart lipitor 10mg  daily  Other Stroke Risk Factors  Advanced age  Former Cigarette smoker  Family hx stroke (  mother)  Migraines on depakote  Depakote for seizure prophylaxis  Other Active Problems  Mild cognitive impairment with memory loss. unable to tolerate aricept due to GI upset. Having intermittent auditory hallucinations   Urge incontinence  Hospital day # [place] for Pager information 08/07/2015 12:17 PM   I, the attending vascular neurologist, have personally obtained a history, examined the patient, evaluated laboratory data, individually viewed imaging studies and agree with radiology interpretations. I obtained additional history from pt's daughters at bedside. I also discussed with Dr. Posey Pronto regarding her care plan. Together with the NP/PA, we formulated the assessment and plan of care which reflects our mutual decision.  I have made any additions or clarifications directly to the above note and agree with the findings and plan as currently documented.   82 yo F with multiple stroke history and left side residue admitted for worsening left sided weakness. MRI and MRA pending. She has  hx of recrudescence due to UTI. She was found to have UTI again this time. Was given rocephin. Pt symptoms now back to baseline. Will continue eliquis and statin. CUS negative. And pending TTE. Continue PT/OT.    Rosalin Hawking, MD PhD Stroke Neurology 08/07/2015 5:47 PM       To contact Stroke Continuity provider, please refer to [email or web address] After hours, contact General Neurology

## 2015-08-07 NOTE — Care Management Obs Status (Signed)
[facility] NOTIFICATION   Patient Details  Name: [name] MRN: [id number] Date of Birth: 1924/10/26   Medicare Observation Status Notification Given:  Yes    Pollie Friar, RN 08/07/2015, 3:58 PM

## 2015-08-07 NOTE — ED Provider Notes (Signed)
CSN: [id number]     Arrival date & time 08/07/15  0144 History  By signing my name below, I, [name], attest that this documentation has been prepared under the direction and in the presence of Toni Spates, MD. Electronically Signed: Phillis Gregory, ED Scribe. 08/07/2015. 2:09 AM.     Chief Complaint  Patient presents with  . Code Stroke   The history is provided by the EMS personnel and the patient. No language interpreter was used.   HPI Comments: [name] is a 82 y.o. female with a hx of stroke, HTN, atrial flutter, A-Fib on anticoagulation brought in by EMS from home for acute onset left sided weakness onset PTA. Pt was last seen normal at midnight. Pt was able to get out of bed to use the bathroom earlier this evening, but had an acute episode of weakness, causing her to fall. Daughter had to assist patient back to her bedroom. Pt was laying on the ground upon EMS arrival. No fall or LOC. Pt initially did not want to come to the ED. Daughter reports dysphagia that is new tonight and EMS notes left foot drag when they tried to ambulate patient at home. Pt has prior mild left sided deficits from past stroke. Pt's sensory baseline is unknown. Code Stroke called by EMS en route. Pt was evaluated earlier today by her PCP with no new neurological deficits noted. Pt currently denies pain.  Past Medical History  Diagnosis Date  . Arthritis   . Hypertension   . Migraine   . Colon polyp   . Hearing difficulty   . Torus palatinus   . Atrial flutter (HCC) January, 2012  . Stroke (HCC) 08/02/12     right lenticular nucleus infarct  . Left leg weakness 02/05/2013  . Chronic anticoagulation   . High cholesterol   . Atrial fibrillation (HCC)     on Eliquis Rx  . Mild cognitive impairment with memory loss   . Malnutrition (HCC)   . TIA (transient ischemic attack)   . Constipation   . Osteoporosis   . Hemorrhoids    Past Surgical History  Procedure Laterality Date  .  Cataract extraction w/ intraocular lens  implant, bilateral  2006-2008  . Ganglion cyst excision Bilateral 1938,1954,2003,2005    "wrists/hand" (08/01/2012)  . Cardioversion  05/19/2010    Dr. Jacinto Halim  . Tonsillectomy  ~ 1935  . Appendectomy  02/19/53    `  . Mouth surgery      [name]   Family History  Problem Relation Age of Onset  . Colon cancer Father   . Breast cancer    . Brain cancer    . Cancer Mother     breast  . Aneurysm Mother     brain  . Heart attack Neg Hx   . Diabetes Neg Hx   . Hypertension Neg Hx   . Stroke Mother    Social History  Substance Use Topics  . Smoking status: Former Games developer  . Smokeless tobacco: Never Used  . Alcohol Use: No     Comment: 08/01/2012 "glass of wine on special occasions"   OB History    No data available     Review of Systems  10 Systems reviewed and all are negative for acute change except as noted in the HPI.  Allergies  Aricept; Tramadol hcl; and Alendronate sodium  Home Medications   Prior to Admission medications   Medication Sig Start Date End Date Taking? Authorizing Provider  acetaminophen (TYLENOL) 500 MG tablet Take 1,000 mg by mouth as needed for mild pain or headache.     Historical Provider, MD  b complex vitamins tablet Take 1 tablet by mouth 2 (two) times daily.     Historical Provider, MD  Calcium Carb-Cholecalciferol (CALCIUM 600 + D) 600-200 MG-UNIT TABS Take 1 tablet by mouth 2 (two) times daily.    Historical Provider, MD  cetirizine (ZYRTEC) 10 MG tablet Take 10 mg by mouth at bedtime.    Historical Provider, MD  Cholecalciferol (VITAMIN D) 2000 UNITS tablet Take 2,000 Units by mouth daily.    Historical Provider, MD  diphenhydramine-acetaminophen (TYLENOL PM) 25-500 MG TABS Take 2 tablets by mouth at bedtime as needed (sleep).     Historical Provider, MD  divalproex (DEPAKOTE) 125 MG DR tablet Take 2 tablets (250 mg total) by mouth 2 (two) times daily. 07/25/14   Garvin Fila, MD  Docusate Sodium (COLACE PO)  Take 100 mg by mouth 2 (two) times daily.     Historical Provider, MD  ELIQUIS 2.5 MG TABS tablet TAKE 1 TABLET BY MOUTH TWICE DAILY 03/25/15   Garvin Fila, MD  hydrocortisone (ANUSOL-HC) 2.5 % rectal cream Place 1 application rectally as needed for hemorrhoids or itching.    Historical Provider, MD  hydrocortisone (ANUSOL-HC) 25 MG suppository Place 25 mg rectally as needed for hemorrhoids or itching.    Historical Provider, MD  Melatonin 3 MG TABS Take 1 tablet by mouth at bedtime    Historical Provider, MD  mirabegron ER (MYRBETRIQ) 25 MG TB24 tablet Take 1 tablet (25 mg total) by mouth daily. 08/06/15   Gildardo Cranker, DO  Multiple Vitamin (MULTIVITAMIN WITH MINERALS) TABS Take 1 tablet by mouth daily.    Historical Provider, MD  olopatadine (PATANOL) 0.1 % ophthalmic solution Place 1 drop into both eyes 2 (two) times daily. 06/11/15   Monica Carter, DO  ondansetron (ZOFRAN) 4 MG tablet Take 1 tablet (4 mg total) by mouth every 6 (six) hours as needed for nausea. 10/31/14   Delfina Redwood, MD   BP 111/91 mmHg  Pulse 81  Temp(Src) 98.4 F (36.9 C) (Oral)  Resp 19  SpO2 98% Physical Exam  Constitutional: She is oriented to person, place, and time. She appears well-developed and well-nourished. No distress.  Thin, frail elderly woman; Awake, alert  HENT:  Head: Normocephalic and atraumatic.  Left facial droop at mouth  Eyes: Conjunctivae and EOM are normal. Pupils are equal, round, and reactive to light.  Neck: Neck supple.  Cardiovascular: Normal rate, regular rhythm and normal heart sounds.   No murmur heard. Pulmonary/Chest: Effort normal and breath sounds normal. No respiratory distress.  Abdominal: Soft. Bowel sounds are normal. She exhibits no distension.  Musculoskeletal: She exhibits no edema.  Neurological: She is alert and oriented to person, place, and time. She has normal reflexes. No cranial nerve deficit. She exhibits normal muscle tone.  Fluent speech, abnormal  finger-to-nose testing on left, negative pronator drift, no clonus; normal heel-to-shin testing bilaterally 5/5 strength and normal sensation x RUE, RLE, LUE 4/5 strength and absent sensation x LLE Grips equal and symmetric  Skin: Skin is warm and dry.  Healing ecchymosis on left lateral face  Psychiatric: She has a normal mood and affect. Judgment and thought content normal.  Nursing note and vitals reviewed.   ED Course  Procedures (including critical care time) DIAGNOSTIC STUDIES: Oxygen Saturation is 98% on RA, normal by my interpretation.  COORDINATION OF CARE: 1:47 AM-CT scans, labs, EKG, Code stroke initiated PTA   Labs Review Labs Reviewed  COMPREHENSIVE METABOLIC PANEL - Abnormal; Notable for the following:    Glucose, Bld 104 (*)    Total Protein 6.1 (*)    ALT 11 (*)    GFR calc non Af Amer 49 (*)    GFR calc Af Amer 57 (*)    All other components within normal limits  URINALYSIS, ROUTINE W REFLEX MICROSCOPIC (NOT AT [facility]) - Abnormal; Notable for the following:    APPearance CLOUDY (*)    Leukocytes, UA LARGE (*)    All other components within normal limits  URINE MICROSCOPIC-ADD ON - Abnormal; Notable for the following:    Squamous Epithelial / LPF 0-5 (*)    Bacteria, UA RARE (*)    Casts HYALINE CASTS (*)    All other components within normal limits  CBG MONITORING, ED - Abnormal; Notable for the following:    Glucose-Capillary 102 (*)    All other components within normal limits  I-STAT CHEM 8, ED - Abnormal; Notable for the following:    BUN 24 (*)    All other components within normal limits  PROTIME-INR  APTT  CBC  DIFFERENTIAL  I-STAT TROPOININ, ED    Imaging Review Ct Head Wo Contrast  08/07/2015  CLINICAL DATA:  82 year old female with acute onset left-sided weakness and slurred speech. Code stroke. EXAM: CT HEAD WITHOUT CONTRAST TECHNIQUE: Contiguous axial images were obtained from the base of the skull through the vertex without intravenous  contrast. COMPARISON:  CT dated 06/23/2015 FINDINGS: The ventricles are dilated and the sulci are prominent compatible with age-related atrophy. Periventricular and deep white matter hypodensities represent chronic microvascular ischemic changes. There is a focal area old infarct and encephalomalacia in the left occipital lobe. There is no intracranial hemorrhage. No mass effect or midline shift identified. The visualized paranasal sinuses and mastoid air cells are well aerated. The calvarium is intact. IMPRESSION: No acute intracranial hemorrhage. Age-related atrophy and chronic microvascular ischemic disease. Left occipital old infarct and encephalomalacia. If symptoms persist and there are no contraindications, MRI may provide better evaluation if clinically indicated. These results were called by telephone at the time of interpretation on 08/07/2015 at 2:04 am to Dr. Nicole Kindred, who verbally acknowledged these results. Electronically Signed   By: Anner Crete M.D.   On: 08/07/2015 02:05   I have personally reviewed and evaluated these lab results as part of my medical decision-making.   EKG Interpretation   Date/Time:  Thursday Aug 07 2015 02:00:26 EDT Ventricular Rate:  77 PR Interval:    QRS Duration: 92 QT Interval:  402 QTC Calculation: 455 R Axis:   5 Text Interpretation:  Atrial flutter with predominant 3:1 AV block  Anteroseptal infarct, old Minimal ST depression, inferior leads No  significant change since last tracing Confirmed by Esmee Fallaw MD, Kathyrn Warmuth  [phone]) on 08/07/2015 2:34:50 AM     Medications  cefTRIAXone (ROCEPHIN) 1 g in dextrose 5 % 50 mL IVPB (not administered)    MDM   Final diagnoses:  Left-sided weakness  Slurred speech   PT w/ h/o CVA w/ mild L leg weakness At baseline presents with sudden onset of profound left leg weakness noted by family members as well as slurred speech. Stroke alert called by EMS. On arrival, the patient was awake and alert, in no acute  distress. Vital signs unremarkable. She was met by neurology, Dr. Nicole Kindred, and taken to CT  scanner. Head CT negative acute. On exam, she had mild left leg weakness, and dysmetria with left hand, no obvious speech deficits. OBtained above labs which show UA c/w infection, otherwise no change from baseline. Urine culture sent, gave CTX to treat UTI. Dr. Nicole Kindred recommended admission for stroke work up. Discussed with [facility], Dr. Loleta Books, who will admit pt for further care. Family in agreement w/ plan.     I personally performed the services described in this documentation, which was scribed in my presence. The recorded information has been reviewed and is accurate.    Sharlett Iles, MD 08/07/15 [id number]

## 2015-08-07 NOTE — Consult Note (Signed)
Admission H&P    Chief Complaint: Slurred speech and worsening of left-sided weakness.  HPI: [name] is an 82 y.o. female history of atrial fibrillation on Eliquis, hypertension, previous cerebral stroke with left-sided residual weakness and hyperlipidemia, brought to the emergency room and code stroke status following the acute onset of worsening of left-sided weakness as well as slurred speech. Patient was last known well at 12 AM today. She reportedly fell when she comes out of bed at 1245. Deficits of subsequently improved. Slurred speech has resolved. CT scan of the head showed no acute intracranial abnormality. She still hadn't motor and sensory changes on the left as well as mild left facial weakness. NIH stroke score was 4.  LSN: 12 AM on 08/07/2015 tPA Given: No: On anticoagulation was Eliquis mRankin:  Past Medical History  Diagnosis Date  . Arthritis   . Hypertension   . Migraine   . Colon polyp   . Hearing difficulty   . Torus palatinus   . Atrial flutter ([place]) January, 2012  . Stroke ([place]) 08/02/12     right lenticular nucleus infarct  . Left leg weakness 02/05/2013  . Chronic anticoagulation   . High cholesterol   . Atrial fibrillation ([place])     on Eliquis Rx  . Mild cognitive impairment with memory loss   . Malnutrition ([place])   . TIA (transient ischemic attack)   . Constipation   . Osteoporosis   . Hemorrhoids     Past Surgical History  Procedure Laterality Date  . Cataract extraction w/ intraocular lens  implant, bilateral  2006-2008  . Ganglion cyst excision Bilateral 1938,1954,2003,2005    "wrists/hand" (08/01/2012)  . Cardioversion  05/19/2010    Dr. Einar Gip  . Tonsillectomy  ~ 1935  . Appendectomy  02/19/53    `  . Mouth surgery      [name]    Family History  Problem Relation Age of Onset  . Colon cancer Father   . Breast cancer    . Brain cancer    . Cancer Mother     breast  . Aneurysm Mother     brain  . Heart attack Neg Hx   . Diabetes  Neg Hx   . Hypertension Neg Hx   . Stroke Mother    Social History:  reports that she has quit smoking. She has never used smokeless tobacco. She reports that she does not drink alcohol or use illicit drugs.  Allergies:  Allergies  Allergen Reactions  . Aricept [Donepezil Hcl] Hypertension    With N/V  . Tramadol Hcl Other (See Comments)     lethargy, nausea  . Alendronate Sodium Rash and Other (See Comments)    puffy eyes    Medications: Preadmission medications are reviewed by me.  ROS: History obtained from the patient  General ROS: negative for - chills, fatigue, fever, night sweats, weight gain or weight loss Psychological ROS: negative for - behavioral disorder, hallucinations, memory difficulties, mood swings or suicidal ideation Ophthalmic ROS: negative for - blurry vision, double vision, eye pain or loss of vision ENT ROS: negative for - epistaxis, nasal discharge, oral lesions, sore throat, tinnitus or vertigo Allergy and Immunology ROS: negative for - hives or itchy/watery eyes Hematological and Lymphatic ROS: negative for - bleeding problems, bruising or swollen lymph nodes Endocrine ROS: negative for - galactorrhea, hair pattern changes, polydipsia/polyuria or temperature intolerance Respiratory ROS: negative for - cough, hemoptysis, shortness of breath or wheezing Cardiovascular ROS: negative for -  chest pain, dyspnea on exertion, edema or irregular heartbeat Gastrointestinal ROS: negative for - abdominal pain, diarrhea, hematemesis, nausea/vomiting or stool incontinence Genito-Urinary ROS: negative for - dysuria, hematuria, incontinence or urinary frequency/urgency Musculoskeletal ROS: negative for - joint swelling or muscular weakness Neurological ROS: as noted in HPI Dermatological ROS: negative for rash and skin lesion changes  Physical Examination: Blood pressure 176/90, pulse 78, temperature 98.4 F (36.9 C), temperature source Oral, resp. rate 20, SpO2 98  %.  HEENT-  Normocephalic, no lesions, without obvious abnormality.  Normal external eye and conjunctiva.  Normal TM's bilaterally.  Normal auditory canals and external ears. Normal external nose, mucus membranes and septum.  Normal pharynx. Neck supple with no masses, nodes, nodules or enlargement. Cardiovascular - regular rate and rhythm, S1, S2 normal, no murmur, click, rub or gallop Lungs - chest clear, no wheezing, rales, normal symmetric air entry Abdomen - soft, non-tender; bowel sounds normal; no masses,  no organomegaly Extremities - no joint deformities, effusion, or inflammation and no edema  Neurologic Examination: Mental Status: Alert, oriented, thought content appropriate.  Speech fluent without evidence of aphasia. Able to follow commands without difficulty. Cranial Nerves: II-Visual fields were normal. III/IV/VI-Pupils were equal and reacted normally to light. Extraocular movements were full and conjugate.    V/VII-no facial numbness; slight left lower facial weakness. VIII-normal. X-normal speech. XI: trapezius strength/neck flexion strength normal bilaterally XII-midline tongue extension with normal strength. Motor: Mild drift of left lower extremity; motor exam otherwise unremarkable. Sensory: Reduced perception of tactile sensation of her left leg compared to the right; patient also demonstrated left side extinction. Deep Tendon Reflexes: 1+ and symmetric. Plantars: Mute bilaterally Cerebellar: Normal finger-to-nose testing. Carotid auscultation: Normal  Results for orders placed or performed during the hospital encounter of 08/07/15 (from the past 48 hour(s))  Protime-INR     Status: None   Collection Time: 08/07/15  1:35 AM  Result Value Ref Range   Prothrombin Time 14.7 11.6 - 15.2 seconds   INR 1.13 0.00 - 1.49  APTT     Status: None   Collection Time: 08/07/15  1:35 AM  Result Value Ref Range   aPTT 28 24 - 37 seconds  CBC     Status: None   Collection  Time: 08/07/15  1:35 AM  Result Value Ref Range   WBC 5.0 4.0 - 10.5 K/uL   RBC 4.07 3.87 - 5.11 MIL/uL   Hemoglobin 12.7 12.0 - 15.0 g/dL   HCT 39.0 36.0 - 46.0 %   MCV 95.8 78.0 - 100.0 fL   MCH 31.2 26.0 - 34.0 pg   MCHC 32.6 30.0 - 36.0 g/dL   RDW 14.5 11.5 - 15.5 %   Platelets 177 150 - 400 K/uL  Differential     Status: None   Collection Time: 08/07/15  1:35 AM  Result Value Ref Range   Neutrophils Relative % 53 %   Neutro Abs 2.6 1.7 - 7.7 K/uL   Lymphocytes Relative 29 %   Lymphs Abs 1.5 0.7 - 4.0 K/uL   Monocytes Relative 15 %   Monocytes Absolute 0.8 0.1 - 1.0 K/uL   Eosinophils Relative 2 %   Eosinophils Absolute 0.1 0.0 - 0.7 K/uL   Basophils Relative 1 %   Basophils Absolute 0.0 0.0 - 0.1 K/uL  I-stat troponin, ED     Status: None   Collection Time: 08/07/15  1:54 AM  Result Value Ref Range   Troponin i, poc 0.01 0.00 - 0.08 ng/mL  Comment 3            Comment: Due to the release kinetics of cTnI, a negative result within the first hours of the onset of symptoms does not rule out myocardial infarction with certainty. If myocardial infarction is still suspected, repeat the test at appropriate intervals.   CBG monitoring, ED     Status: Abnormal   Collection Time: 08/07/15  2:02 AM  Result Value Ref Range   Glucose-Capillary 102 (H) 65 - 99 mg/dL   Comment 1 Notify RN    Comment 2 Document in Chart   I-Stat Chem 8, ED     Status: Abnormal   Collection Time: 08/07/15  2:03 AM  Result Value Ref Range   Sodium 145 135 - 145 mmol/L   Potassium 4.2 3.5 - 5.1 mmol/L   Chloride 107 101 - 111 mmol/L   BUN 24 (H) 6 - 20 mg/dL   Creatinine, Ser 1.00 0.44 - 1.00 mg/dL   Glucose, Bld 96 65 - 99 mg/dL   Calcium, Ion 1.18 1.13 - 1.30 mmol/L   TCO2 28 0 - 100 mmol/L   Hemoglobin 14.3 12.0 - 15.0 g/dL   HCT 42.0 36.0 - 46.0 %   Ct Head Wo Contrast  08/07/2015  CLINICAL DATA:  82 year old female with acute onset left-sided weakness and slurred speech. Code stroke.  EXAM: CT HEAD WITHOUT CONTRAST TECHNIQUE: Contiguous axial images were obtained from the base of the skull through the vertex without intravenous contrast. COMPARISON:  CT dated 06/23/2015 FINDINGS: The ventricles are dilated and the sulci are prominent compatible with age-related atrophy. Periventricular and deep white matter hypodensities represent chronic microvascular ischemic changes. There is a focal area old infarct and encephalomalacia in the left occipital lobe. There is no intracranial hemorrhage. No mass effect or midline shift identified. The visualized paranasal sinuses and mastoid air cells are well aerated. The calvarium is intact. IMPRESSION: No acute intracranial hemorrhage. Age-related atrophy and chronic microvascular ischemic disease. Left occipital old infarct and encephalomalacia. If symptoms persist and there are no contraindications, MRI may provide better evaluation if clinically indicated. These results were called by telephone at the time of interpretation on 08/07/2015 at 2:04 am to Dr. Nicole Kindred, who verbally acknowledged these results. Electronically Signed   By: Anner Crete M.D.   On: 08/07/2015 02:05    Assessment: 82 y.o. female with multiple risk factors for stroke as well as previous stroke presenting with probable acute recurrent right ischemic infarction.  Stroke Risk Factors - atrial fibrillation and hypertension, hyperlipidemia  Plan: 1. HgbA1c, fasting lipid panel 2. MRI, MRA  of the brain without contrast 3. PT consult, OT consult, Speech consult 4. Echocardiogram 5. Carotid dopplers 6. Prophylactic therapy-Anticoagulation: Eliquis 7. Risk factor modification 8. Telemetry monitoring  C.R. Nicole Kindred, MD [facility] hospitalist [phone]  08/07/2015, 2:17 AM

## 2015-08-07 NOTE — Progress Notes (Signed)
[facility] PLAN OF CARE NOTE Patient: [name] [id number]   PCP: Gildardo Cranker, DO DOB: 05/14/1924   DOA: 08/07/2015   DOS: 08/07/2015    Patient was admitted by my colleague Dr.Danford earlier on 08/07/2015. I have reviewed the H&P as well as assessment and plan and agree with the same. Important changes in the plan are listed below.  Plan of care: Principal Problem:   Left-sided weakness Suspected for TIA. MRI pending. Speech therapy recommends a regular oral diet. Diet will be advance to cardiac diet. Home medications will be resumed as well. Continue Apixaban  Author: Berle Mull, MD [facility] Pager: [phone] 08/07/2015 5:25 PM   If 7PM-7AM, please contact night-coverage at [email or web address], password [facility]

## 2015-08-07 NOTE — Evaluation (Signed)
Clinical/Bedside Swallow Evaluation Patient Details  Name: [name] MRN: [id number] Date of Birth: 1925/01/11  Today's Date: 08/07/2015 Time: SLP Start Time (ACUTE ONLY): 1632 SLP Stop Time (ACUTE ONLY): [id number] SLP Time Calculation (min) (ACUTE ONLY): 20 min  Past Medical History:  Past Medical History  Diagnosis Date  . Arthritis   . Hypertension   . Migraine   . Colon polyp   . Hearing difficulty   . Torus palatinus   . Atrial flutter ([place]) January, 2012  . Stroke ([place]) 08/02/12     right lenticular nucleus infarct  . Left leg weakness 02/05/2013  . Chronic anticoagulation   . High cholesterol   . Atrial fibrillation ([place])     on Eliquis Rx  . Mild cognitive impairment with memory loss   . Malnutrition ([place])   . TIA (transient ischemic attack)   . Constipation   . Osteoporosis   . Hemorrhoids    Past Surgical History:  Past Surgical History  Procedure Laterality Date  . Cataract extraction w/ intraocular lens  implant, bilateral  2006-2008  . Ganglion cyst excision Bilateral 1938,1954,2003,2005    "wrists/hand" (08/01/2012)  . Cardioversion  05/19/2010    Dr. Einar Gip  . Tonsillectomy  ~ 1935  . Appendectomy  02/19/53    `  . Mouth surgery      [name]   HPI:  82 y.o. female history of atrial fibrillation on Eliquis, hypertension, previous cerebral stroke with left-sided residual weakness and hyperlipidemia, brought to the emergency room and code stroke status following the acute onset of worsening of left-sided weakness as well as slurred speech. Pt was recently in the ED due to choking episode with grits, but was d/c home with instructions to f/u with GI (has not happened yet). Her daughters report that this was an isolated incident.   Assessment / Plan / Recommendation Clinical Impression  Pt's oropharyngeal swallow appears WFL, although daughters do report a recent choking episode and frequent eructation at home, for which they have been referred to GI for follow up.  She also seems to prefer softer food choices. For now, would start a regular diet and thin liquids, allowing pt and family to select which foods they believe are soft enough. No SLP f/u indicated for swallowing at this time. Will f/u for cognitive-linguistic evaluation.    Aspiration Risk  Mild aspiration risk    Diet Recommendation Regular;Thin liquid   Liquid Administration via: Cup;Straw Medication Administration: Whole meds with liquid Supervision: Patient able to self feed;Intermittent supervision to cue for compensatory strategies Compensations: Slow rate;Small sips/bites;Minimize environmental distractions;Follow solids with liquid Postural Changes: Seated upright at 90 degrees;Remain upright for at least 30 minutes after po intake    Other  Recommendations Oral Care Recommendations: Oral care BID   Follow up Recommendations   (tba, none for swallow)    Frequency and Duration            Prognosis        Swallow Study   General HPI: 82 y.o. female history of atrial fibrillation on Eliquis, hypertension, previous cerebral stroke with left-sided residual weakness and hyperlipidemia, brought to the emergency room and code stroke status following the acute onset of worsening of left-sided weakness as well as slurred speech. Pt was recently in the ED due to choking episode with grits, but was d/c home with instructions to f/u with GI (has not happened yet). Her daughters report that this was an isolated incident. Type of Study: Bedside Swallow Evaluation  Previous Swallow Assessment: none in chart Diet Prior to this Study: NPO Temperature Spikes Noted: No Respiratory Status: Room air History of Recent Intubation: No Behavior/Cognition: Alert;Cooperative;Pleasant mood Oral Cavity Assessment: Within Functional Limits Oral Care Completed by SLP: No Oral Cavity - Dentition: Dentures, top Vision: Functional for self-feeding Self-Feeding Abilities: Able to feed self Patient  Positioning: Upright in bed Baseline Vocal Quality: Normal    Oral/Motor/Sensory Function     Ice Chips Ice chips: Not tested   Thin Liquid Thin Liquid: Within functional limits Presentation: Cup;Self Fed;Straw    Nectar Thick Nectar Thick Liquid: Not tested   Honey Thick Honey Thick Liquid: Not tested   Puree Puree: Within functional limits Presentation: Self Fed;Spoon   Solid   GO   Solid: Within functional limits Presentation: Self Fed    Functional Assessment Tool Used: skilled clinicl judgment Functional Limitations: Swallowing Swallow Current Status [id number]): At least 1 percent but less than 20 percent impaired, limited or restricted Swallow Goal Status [id number]): At least 1 percent but less than 20 percent impaired, limited or restricted Swallow Discharge Status [phone]): At least 1 percent but less than 20 percent impaired, limited or restricted   Germain Osgood, M.A. CCC-SLP [phone]  Germain Osgood 08/07/2015,5:09 PM

## 2015-08-08 DIAGNOSIS — M6289 Other specified disorders of muscle: Secondary | ICD-10-CM | POA: Diagnosis not present

## 2015-08-08 DIAGNOSIS — N3000 Acute cystitis without hematuria: Secondary | ICD-10-CM | POA: Diagnosis not present

## 2015-08-08 DIAGNOSIS — I10 Essential (primary) hypertension: Secondary | ICD-10-CM | POA: Diagnosis not present

## 2015-08-08 LAB — BASIC METABOLIC PANEL
Anion gap: 10 (ref 5–15)
BUN: 8 mg/dL (ref 6–20)
CHLORIDE: 107 mmol/L (ref 101–111)
CO2: 26 mmol/L (ref 22–32)
CREATININE: 0.76 mg/dL (ref 0.44–1.00)
Calcium: 8.4 mg/dL — ABNORMAL LOW (ref 8.9–10.3)
GFR calc Af Amer: >60 mL/min (ref >60)
GFR calc non Af Amer: >60 mL/min (ref >60)
GLUCOSE: 136 mg/dL — AB (ref 65–99)
POTASSIUM: 3.9 mmol/L (ref 3.5–5.1)
Sodium: 143 mmol/L (ref 135–145)

## 2015-08-08 LAB — COMPREHENSIVE METABOLIC PANEL
ALBUMIN: 3.3 g/dL — AB (ref 3.5–5.0)
ALK PHOS: 43 U/L (ref 38–126)
ALT: 12 U/L — AB (ref 14–54)
ANION GAP: 11 (ref 5–15)
AST: 23 U/L (ref 15–41)
BILIRUBIN TOTAL: 0.6 mg/dL (ref 0.3–1.2)
BUN: 9 mg/dL (ref 6–20)
CALCIUM: 8.7 mg/dL — AB (ref 8.9–10.3)
CO2: 29 mmol/L (ref 22–32)
CREATININE: 0.74 mg/dL (ref 0.44–1.00)
Chloride: 108 mmol/L (ref 101–111)
GFR calc Af Amer: >60 mL/min (ref >60)
GFR calc non Af Amer: >60 mL/min (ref >60)
GLUCOSE: 95 mg/dL (ref 65–99)
Potassium: 4 mmol/L (ref 3.5–5.1)
SODIUM: 148 mmol/L — AB (ref 135–145)
TOTAL PROTEIN: 6 g/dL — AB (ref 6.5–8.1)

## 2015-08-08 LAB — CBC WITH DIFFERENTIAL/PLATELET
BASOS PCT: 1 %
Basophils Absolute: 0 10*3/uL (ref 0.0–0.1)
Eosinophils Absolute: 0.1 10*3/uL (ref 0.0–0.7)
Eosinophils Relative: 2 %
HEMATOCRIT: 40.1 % (ref 36.0–46.0)
HEMOGLOBIN: 13 g/dL (ref 12.0–15.0)
LYMPHS ABS: 1.4 10*3/uL (ref 0.7–4.0)
Lymphocytes Relative: 26 %
MCH: 31.2 pg (ref 26.0–34.0)
MCHC: 32.4 g/dL (ref 30.0–36.0)
MCV: 96.2 fL (ref 78.0–100.0)
MONOS PCT: 14 %
Monocytes Absolute: 0.7 10*3/uL (ref 0.1–1.0)
NEUTROS ABS: 3 10*3/uL (ref 1.7–7.7)
NEUTROS PCT: 57 %
Platelets: 172 10*3/uL (ref 150–400)
RBC: 4.17 MIL/uL (ref 3.87–5.11)
RDW: 14.6 % (ref 11.5–15.5)
WBC: 5.3 10*3/uL (ref 4.0–10.5)

## 2015-08-08 LAB — HEMOGLOBIN A1C
HEMOGLOBIN A1C: 5.7 % — AB (ref 4.8–5.6)
MEAN PLASMA GLUCOSE: 117 mg/dL

## 2015-08-08 LAB — VITAMIN B12: Vitamin B-12: 1197 pg/mL — ABNORMAL HIGH (ref 180–914)

## 2015-08-08 LAB — VALPROIC ACID LEVEL: VALPROIC ACID LVL: 48 ug/mL — AB (ref 50.0–100.0)

## 2015-08-08 LAB — MAGNESIUM: Magnesium: 2.1 mg/dL (ref 1.7–2.4)

## 2015-08-08 LAB — URINE CULTURE: SPECIAL REQUESTS: NORMAL

## 2015-08-08 LAB — AMMONIA: Ammonia: 16 umol/L (ref 9–35)

## 2015-08-08 MED ORDER — TAMSULOSIN HCL 0.4 MG PO CAPS
0.4000 mg | ORAL_CAPSULE | Freq: Every day | ORAL | Status: DC
  Administered 2015-08-08: 0.4 mg via ORAL
  Filled 2015-08-08: qty 1

## 2015-08-08 MED ORDER — DEXTROSE 5 % IV SOLN
1.0000 g | Freq: Every day | INTRAVENOUS | Status: DC
  Administered 2015-08-08 – 2015-08-12 (×5): 1 g via INTRAVENOUS
  Filled 2015-08-08 (×5): qty 10

## 2015-08-08 NOTE — Evaluation (Signed)
Physical Therapy Evaluation Patient Details Name: [name] MRN: [id number] DOB: June 05, 1924 Today's Date: 08/08/2015   History of Present Illness  82 y.o. female history of atrial fibrillation on Eliquis, hypertension, previous cerebral stroke with left-sided residual weakness and hyperlipidemia, brought to the emergency room and code stroke status following the acute onset of worsening of left-sided weakness as well as slurred speech.  MRI negative for acute process, old R lacunar basal ganglia and L parieto occipital lobe infarcts seen.  Clinical Impression  Patient presents with decreased independence with mobility due to deficits listed in PT problem list.  She will benefit from skilled PT in the acute setting to allow return home with family support following SNF level rehab stay.    Follow Up Recommendations SNF    Equipment Recommendations  None recommended by PT    Recommendations for Other Services       Precautions / Restrictions Precautions Precautions: Fall Precaution Comments: watch BP      Mobility  Bed Mobility Overal bed mobility: Needs Assistance Bed Mobility: Supine to Sit;Sit to Supine     Supine to sit: Min assist Sit to supine: Max assist   General bed mobility comments: assist to scoot out to EOB and lift trunk with ues of rail  Transfers Overall transfer level: Needs assistance Equipment used: 1 person hand held assist;Rolling walker (2 wheeled) Transfers: Sit to/from [facility] Sit to Stand: Mod assist Stand pivot transfers: Mod assist       General transfer comment: leaning R and difficulty coming upright for pivot to [facility] without walker, so assisted for balance to get walker and then able to take pivotal steps to [facility]  Ambulation/Gait Ambulation/Gait assistance: Mod assist;Min assist Ambulation Distance (Feet): 12 Feet Assistive device: Rolling walker (2 wheeled) Gait Pattern/deviations: Step-to pattern;Decreased stride  length;Staggering right     General Gait Details: cues for proximity to walker, in room ambulation and multiple turns with cues and min/mod A; needs mod to max multimodal cues with one step commands for each transition  Stairs            Wheelchair Mobility    Modified Rankin (Stroke Patients Only) Modified Rankin (Stroke Patients Only) Pre-Morbid Rankin Score: Moderately severe disability Modified Rankin: Moderately severe disability     Balance Overall balance assessment: Needs assistance Sitting-balance support: Bilateral upper extremity supported;Feet supported Sitting balance-Leahy Scale: Poor Sitting balance - Comments: falling to R initially, able to sit with S with UE support and cues   Standing balance support: Bilateral upper extremity supported Standing balance-Leahy Scale: Poor Standing balance comment: min A for static balance with walker for support                             Pertinent Vitals/Pain Pain Assessment: No/denies pain    Home Living Family/patient expects to be discharged to:: Private residence Living Arrangements: Children Available Help at Discharge: Family;Available 24 hours/day Type of Home: House Home Access: Stairs to enter Entrance Stairs-Rails: None Entrance Stairs-Number of Steps: 2 small steps from garage Home Layout: Two level Home Equipment: [place] - 2 wheels;Shower seat - built in;Bedside commode;Transport chair      Prior Function Level of Independence: Needs assistance   Gait / Transfers Assistance Needed: Uses RW on main level of home. HHA upstairs in bathroom due to space limitations for the walker.            Hand Dominance  Dominant Hand: Right    Extremity/Trunk Assessment   Upper Extremity Assessment: Generalized weakness           Lower Extremity Assessment: Generalized weakness      Cervical / Trunk Assessment: Kyphotic  Communication   Communication: HOH  Cognition  Arousal/Alertness: Awake/alert Behavior During Therapy: WFL for tasks assessed/performed Overall Cognitive Status: Impaired/Different from baseline Area of Impairment: Orientation               General Comments: daughter report pt is usually more alert and questioning about her care; state she is more confused than usual    General Comments General comments (skin integrity, edema, etc.): two daughters in room reporting she usually is weak on L and haven't seen her act like this.  Pt reporting feels as if she is swaying when up on EOB, BSC, etc; bilat mid hall pike performed negative for BPPV.    Exercises        Assessment/Plan    PT Assessment Patient needs continued PT services  PT Diagnosis Abnormality of gait;Generalized weakness;Altered mental status   PT Problem List Decreased strength;Decreased knowledge of use of DME;Decreased activity tolerance;Decreased balance;Decreased coordination;Decreased mobility  PT Treatment Interventions DME instruction;Gait training;Functional mobility training;Patient/family education;Therapeutic exercise;Therapeutic activities;Balance training   PT Goals (Current goals can be found in the Care Plan section) Acute Rehab PT Goals Patient Stated Goal: To get stronger PT Goal Formulation: With patient/family Time For Goal Achievement: 08/15/15 Potential to Achieve Goals: Fair    Frequency Min 3X/week   Barriers to discharge        Co-evaluation               End of Session Equipment Utilized During Treatment: Gait belt Activity Tolerance: Patient limited by fatigue Patient left: in bed;with family/visitor present;with bed alarm set;with call bell/phone within reach      Functional Assessment Tool Used: Clinical Judgement Functional Limitation: Mobility: Walking and moving around Mobility: Walking and Moving Around Current Status [phone]): At least 60 percent but less than 80 percent impaired, limited or restricted Mobility:  Walking and Moving Around Goal Status [phone]): At least 40 percent but less than 60 percent impaired, limited or restricted    Time: 0840-0910 PT Time Calculation (min) (ACUTE ONLY): 30 min   Charges:   PT Evaluation $PT Eval Moderate Complexity: 1 Procedure PT Treatments $Gait Training: 8-22 mins   PT G Codes:   PT G-Codes **NOT FOR INPATIENT CLASS** Functional Assessment Tool Used: Clinical Judgement Functional Limitation: Mobility: Walking and moving around Mobility: Walking and Moving Around Current Status [id number]): At least 60 percent but less than 80 percent impaired, limited or restricted Mobility: Walking and Moving Around Goal Status [phone]): At least 40 percent but less than 60 percent impaired, limited or restricted    Reginia Naas 08/08/2015, 1:25 PM  Magda Kiel, [place] 08/08/2015

## 2015-08-08 NOTE — Progress Notes (Signed)
[facility] Progress Note  Patient: [name] [id number]   PCP: Gildardo Cranker, DO DOB: [birth year]   DOA: 08/07/2015   DOS: 08/08/2015   Date of Service: the patient was seen and examined on 08/08/2015  Subjective: The patient has on and off confusion. Patient has significant urinary retention  post void, patient had seen urology in the past at which time she did not have any retention, but due to complaints of increased urinary frequency the patient was placed on oxybutynin and recently was changed to myrbetriq and received her first dose before admission Nutrition: Tolerating oral diet  Brief hospital course: Patient was admitted on 08/07/2015, with complaint of infusion and weakness of the left side, was found to have recrudescence of her prior CVA without any new CVA. Currently further plan is monitoring for improvement in her encephalopathy.  Assessment and Plan: 1. Left-sided weakness Most likely due to recrudescence from patient's prior CVA due to UTI. MRI brain negative for any acute stroke. Speech therapy recommended regular oral diet. PTOT recommends SNF. Social worker consulted. Continue Apixaban and as well as statin.  2. UTI. Treat with oral Keflex.  3. Urinary retention. Likely due to oxybutynin as well as myrbetriq  This can also cause recurrent urinary infection. Recommend to stop this medication and treat the patient with Flomax.  Pain management: When necessary Tylenol Activity: physical therapy recommends SNF Bowel regimen: last BM 08/07/2015 Diet: Regular diet DVT Prophylaxis: subcutaneous Heparin  Advance goals of care discussion: Partial code  Family Communication: family was present at bedside, at the time of interview. The pt provided permission to discuss medical plan with the family. Opportunity was given to ask question and all questions were answered satisfactorily.   Disposition:  Discharge to SNF versus home with home health Expected  discharge date: 08/09/2015   Consultants: Neurology Procedures: Echocardiogram  Antibiotics: Anti-infectives    Start     Dose/Rate Route Frequency Ordered Stop   08/08/15 0900  cefTRIAXone (ROCEPHIN) 1 g in dextrose 5 % 50 mL IVPB     1 g 100 mL/hr over 30 Minutes Intravenous Daily 08/08/15 0745     08/07/15 0315  cefTRIAXone (ROCEPHIN) 1 g in dextrose 5 % 50 mL IVPB     1 g 100 mL/hr over 30 Minutes Intravenous  Once 08/07/15 0306 08/07/15 0403        Intake/Output Summary (Last 24 hours) at 08/08/15 1910 Last data filed at 08/08/15 1012  Gross per 24 hour  Intake     50 ml  Output   1160 ml  Net  -1110 ml   There were no vitals filed for this visit.  Objective: Physical Exam: Filed Vitals:   08/08/15 0602 08/08/15 0936 08/08/15 1339 08/08/15 1839  BP: 157/92 169/78 159/81 170/65  Pulse: 80 80 88 86  Temp: 97.8 F (36.6 C) 97.9 F (36.6 C) 98 F (36.7 C) 97.9 F (36.6 C)  TempSrc: Oral Oral Oral Oral  Resp: 18 20 18 18   Height:      SpO2: 97% 98% 98% 98%    General: Alert, Awake and Oriented to Time, Place and Person. Appear in mild distress Eyes: PERRL, Conjunctiva normal ENT: Oral Mucosa clear moist. Neck: no JVD, no Abnormal Mass Or lumps Cardiovascular: S1 and S2 Present, no Murmur, Peripheral Pulses Present Respiratory: Bilateral Air entry equal and Decreased,  Clear to Auscultation, no Crackles, no wheezes Abdomen: Bowel Sound present, Soft and no tenderness Skin: redness no, no Rash  Extremities:  no Pedal edema, no calf tenderness Neurologic: Grossly no focal neuro deficit.Bilaterally Equal motor strength  Data Reviewed: CBC:  Recent Labs Lab 08/07/15 0135 08/07/15 0203 08/07/15 0551 08/08/15 0539  WBC 5.0  --  4.3 5.3  NEUTROABS 2.6  --   --  3.0  HGB 12.7 14.3 11.8* 13.0  HCT 39.0 42.0 36.3 40.1  MCV 95.8  --  95.0 96.2  PLT 177  --  155 [id number]   Basic Metabolic Panel:  Recent Labs Lab 08/07/15 0135 08/07/15 0203 08/07/15 0551  08/08/15 0539 08/08/15 1303  NA 145 145 144 148* 143  K 4.3 4.2 3.9 4.0 3.9  CL 108 107 108 108 107  CO2 27  --  26 29 26   GLUCOSE 104* 96 101* 95 136*  BUN 19 24* 15 9 8   CREATININE 0.99 1.00 0.80 0.74 0.76  CALCIUM 9.9  --  9.1 8.7* 8.4*  MG  --   --  2.0 2.1  --     Liver Function Tests:  Recent Labs Lab 08/07/15 0135 08/08/15 0539  AST 24 23  ALT 11* 12*  ALKPHOS 52 43  BILITOT 0.5 0.6  PROT 6.1* 6.0*  ALBUMIN 3.5 3.3*   No results for input(s): LIPASE, AMYLASE in the last 168 hours.  Recent Labs Lab 08/08/15 1303  AMMONIA 16   Coagulation Profile:  Recent Labs Lab 08/07/15 0135  INR 1.13   Cardiac Enzymes: No results for input(s): CKTOTAL, CKMB, CKMBINDEX, TROPONINI in the last 168 hours. BNP (last 3 results) No results for input(s): PROBNP in the last 8760 hours.  CBG:  Recent Labs Lab 08/07/15 0202  GLUCAP 102*    Studies: Mr Virgel Paling Wo Contrast  08/08/2015  CLINICAL DATA:  Slurred speech and severe LEFT-sided weakness. History of stroke and mild LEFT-sided weakness, mild dementia, hypertension, migraines, atrial fibrillation on Eliquis. EXAM: MRI HEAD WITHOUT CONTRAST MRA HEAD WITHOUT CONTRAST TECHNIQUE: Multiplanar, multiecho pulse sequences of the brain and surrounding structures were obtained without intravenous contrast. Angiographic images of the head were obtained using MRA technique without contrast. COMPARISON:  CT HEAD Aug 07, 2015 at 0150 hours and MRI/MRA head December 18, 2014 FINDINGS: MRI HEAD FINDINGS INTRACRANIAL CONTENTS: No reduced diffusion to suggest acute ischemia. No susceptibility artifact to suggest hemorrhage. Old cystic RIGHT basal ganglia lacunar infarct with ex vacuo dilatation RIGHT lateral ventricle. Moderate to severe ventriculomegaly on the basis of global parenchymal brain volume loss. Old small RIGHT cerebellar infarcts. LEFT parietal occipital encephalomalacia. Confluent supratentorial and pontine white matter FLAIR  T2 hyperintensities. No midline shift, mass effect or mass lesions. No abnormal extra-axial fluid collections. ORBITS: The included ocular globes and orbital contents are non-suspicious. Status post bilateral ocular lens implants. SINUSES: The mastoid air-cells and included paranasal sinuses are well-aerated. SKULL/SOFT TISSUES: No abnormal sellar expansion. No suspicious calvarial bone marrow signal. Generalized bright T1 calvarial bone marrow signal compatible with osteopenia. Craniocervical junction maintained. MRA HEAD FINDINGS (mildly motion degraded examination) Anterior circulation: Normal flow related enhancement of the included cervical, petrous, cavernous and supraclinoid internal carotid arteries. Patent anterior communicating artery. Similar tandem high-grade stenosis RIGHT greater than LEFT A2 and A3 segments. Moderate luminal irregularity mid to distal MCA branches. No large vessel occlusion, aneurysm. Posterior circulation: Codominant vertebral artery's. Basilar artery is patent, with normal flow related enhancement of the main branch vessels. Mild stenosis RIGHT P1-2 junction. Moderate stenosis LEFT distal P2 segment. No large vessel occlusion, aneurysm. IMPRESSION: MRI HEAD: No acute intracranial process, specifically no  acute ischemia. Chronic changes including old RIGHT basal ganglia lacunar infarct and old LEFT parieto-occipital lobe infarct (MCA versus watershed territory). Severe chronic small vessel ischemic disease. MRA HEAD: No emergent large vessel occlusion. Stable chronic changes including tandem high-grade stenosis anterior cerebral arteries, moderate luminal irregularity of the mid to distal MCA branches and moderate stenosis LEFT P2 segment. Electronically Signed   By: Elon Alas M.D.   On: 08/08/2015 00:31   Mr Brain Wo Contrast  08/08/2015  CLINICAL DATA:  Slurred speech and severe LEFT-sided weakness. History of stroke and mild LEFT-sided weakness, mild dementia,  hypertension, migraines, atrial fibrillation on Eliquis. EXAM: MRI HEAD WITHOUT CONTRAST MRA HEAD WITHOUT CONTRAST TECHNIQUE: Multiplanar, multiecho pulse sequences of the brain and surrounding structures were obtained without intravenous contrast. Angiographic images of the head were obtained using MRA technique without contrast. COMPARISON:  CT HEAD Aug 07, 2015 at 0150 hours and MRI/MRA head December 18, 2014 FINDINGS: MRI HEAD FINDINGS INTRACRANIAL CONTENTS: No reduced diffusion to suggest acute ischemia. No susceptibility artifact to suggest hemorrhage. Old cystic RIGHT basal ganglia lacunar infarct with ex vacuo dilatation RIGHT lateral ventricle. Moderate to severe ventriculomegaly on the basis of global parenchymal brain volume loss. Old small RIGHT cerebellar infarcts. LEFT parietal occipital encephalomalacia. Confluent supratentorial and pontine white matter FLAIR T2 hyperintensities. No midline shift, mass effect or mass lesions. No abnormal extra-axial fluid collections. ORBITS: The included ocular globes and orbital contents are non-suspicious. Status post bilateral ocular lens implants. SINUSES: The mastoid air-cells and included paranasal sinuses are well-aerated. SKULL/SOFT TISSUES: No abnormal sellar expansion. No suspicious calvarial bone marrow signal. Generalized bright T1 calvarial bone marrow signal compatible with osteopenia. Craniocervical junction maintained. MRA HEAD FINDINGS (mildly motion degraded examination) Anterior circulation: Normal flow related enhancement of the included cervical, petrous, cavernous and supraclinoid internal carotid arteries. Patent anterior communicating artery. Similar tandem high-grade stenosis RIGHT greater than LEFT A2 and A3 segments. Moderate luminal irregularity mid to distal MCA branches. No large vessel occlusion, aneurysm. Posterior circulation: Codominant vertebral artery's. Basilar artery is patent, with normal flow related enhancement of the main  branch vessels. Mild stenosis RIGHT P1-2 junction. Moderate stenosis LEFT distal P2 segment. No large vessel occlusion, aneurysm. IMPRESSION: MRI HEAD: No acute intracranial process, specifically no acute ischemia. Chronic changes including old RIGHT basal ganglia lacunar infarct and old LEFT parieto-occipital lobe infarct (MCA versus watershed territory). Severe chronic small vessel ischemic disease. MRA HEAD: No emergent large vessel occlusion. Stable chronic changes including tandem high-grade stenosis anterior cerebral arteries, moderate luminal irregularity of the mid to distal MCA branches and moderate stenosis LEFT P2 segment. Electronically Signed   By: Elon Alas M.D.   On: 08/08/2015 00:31     Scheduled Meds: . apixaban  2.5 mg Oral BID  . atorvastatin  10 mg Oral q1800  . cefTRIAXone (ROCEPHIN)  IV  1 g Intravenous Daily  . divalproex  250 mg Oral BID  . tamsulosin  0.4 mg Oral Daily   Continuous Infusions:  PRN Meds: acetaminophen **OR** acetaminophen, diphenhydrAMINE, hydrocortisone, senna-docusate  Time spent: 30 minutes  Author: Berle Mull, MD [facility] Pager: [phone] 08/08/2015 7:10 PM  If 7PM-7AM, please contact night-coverage at [email or web address], password [facility]

## 2015-08-08 NOTE — Progress Notes (Signed)
Chronic increased urinary frequency per pt. Been voiding frequently. Measurement of urine output 18ml clear yellow urine, check PVR bladder scan of 466ml retention. Will continue to monitor.

## 2015-08-08 NOTE — Progress Notes (Signed)
Occupational Therapy Evaluation Patient Details Name: [name] MRN: [id number] DOB: January 22, 1925 Today's Date: 08/08/2015    History of Present Illness 82 y.o. female history of atrial fibrillation on Eliquis, hypertension, previous cerebral stroke with left-sided residual weakness and hyperlipidemia, brought to the emergency room and code stroke status following the acute onset of worsening of left-sided weakness as well as slurred speech.  MRI negative for acute process, old R lacunar basal ganglia and L parieto occipital lobe infarcts seen.   Clinical Impression   Pt admitted with the above diagnoses and presents with below problem list. Pt will benefit from continued acute OT to address the below listed deficits and maximize independence with BADLs prior to d/c to venue below. PTA pt was receiving set up to max assist with ADLs. She was ambulating min guard with a rw PTA. Pt is currently min to max A with ADLs and needing min A for standing balance with rw. Session limited by dizziness.      Follow Up Recommendations  SNF    Equipment Recommendations  Other (comment) (defer to next venue)    Recommendations for Other Services       Precautions / Restrictions Precautions Precautions: Fall Precaution Comments: watch BP Restrictions Weight Bearing Restrictions: No      Mobility Bed Mobility Overal bed mobility: Needs Assistance Bed Mobility: Supine to Sit;Sit to Supine     Supine to sit: Min assist Sit to supine: Max assist   General bed mobility comments: assist to scoot out to EOB and lift trunk with ues of rail  Transfers Overall transfer level: Needs assistance Equipment used: Rolling walker (2 wheeled) Transfers: Sit to/from Stand Sit to Stand: Mod assist Stand pivot transfers: Mod assist       General transfer comment: from EOB, + dizziness. Right lean.    Balance Overall balance assessment: Needs assistance;History of Falls Sitting-balance support:  Bilateral upper extremity supported;Feet supported Sitting balance-Leahy Scale: Poor Sitting balance - Comments: right lean. BUE for support.    Standing balance support: Bilateral upper extremity supported Standing balance-Leahy Scale: Poor Standing balance comment: rw at baseline. Min A for balance.                            ADL Overall ADL's : Needs assistance/impaired Eating/Feeding: Sitting;Minimal assistance Eating/Feeding Details (indicate cue type and reason): fair unsupported sitting balance; likely setup level in supported sitting position.  Grooming: Sitting;Maximal assistance   Upper Body Bathing: Moderate assistance;Sitting   Lower Body Bathing: Maximal assistance;Sit to/from stand   Upper Body Dressing : Moderate assistance;Sitting   Lower Body Dressing: Maximal assistance;Sit to/from stand   Toilet Transfer: Moderate assistance;Stand-pivot;BSC;RW   Toileting- Clothing Manipulation and Hygiene: Maximal assistance;Sit to/from stand         General ADL Comments: Pt stood at EOB and took 1 step towards left. Pt reporting increased dizziness and requesting to lie back down.      Vision     Perception     Praxis      Pertinent Vitals/Pain Pain Assessment: Faces Faces Pain Scale: Hurts even more Pain Location: Right knee Pain Descriptors / Indicators: Grimacing Pain Intervention(s): Limited activity within patient's tolerance;Monitored during session;Repositioned     Hand Dominance Right   Extremity/Trunk Assessment Upper Extremity Assessment Upper Extremity Assessment: LUE deficits/detail;Generalized weakness LUE Deficits / Details: grossly 3+/5 LUE; LUE weaker than RUE   Lower Extremity Assessment Lower Extremity Assessment: Defer to PT evaluation;Generalized  weakness   Cervical / Trunk Assessment Cervical / Trunk Assessment: Kyphotic   Communication Communication Communication: HOH   Cognition Arousal/Alertness:  Awake/alert Behavior During Therapy: WFL for tasks assessed/performed Overall Cognitive Status: Impaired/Different from baseline Area of Impairment: Orientation               General Comments: daughter report pt is usually more alert and questioning about her care; state she is more confused than usual   General Comments       Exercises       Shoulder Instructions      Home Living Family/patient expects to be discharged to:: Private residence Living Arrangements: Children Available Help at Discharge: Family;Available 24 hours/day Type of Home: House Home Access: Stairs to enter [employer] of Steps: 2 small steps from garage Entrance Stairs-Rails: None Home Layout: Two level Alternate Level Stairs-Number of Steps: has a stair lift   Bathroom Shower/Tub: Occupational psychologist: Standard     Home Equipment: Environmental consultant - 2 wheels;Shower seat - built in;Bedside commode;Transport chair          Prior Functioning/Environment Level of Independence: Needs assistance  Gait / Transfers Assistance Needed: Uses RW on main level of home. HHA upstairs in bathroom due to space limitations for the walker.  ADL's / Homemaking Assistance Needed: Needs assist with all ADLs.  Pt's daughters report she can complete UB bathing and dressing with setup assist, but is total assist for LB bathing and dressing.  Pt requires max-total assist with toileting. Able to feed self with set up. Completes some grooming herself (oral care) and some she has help with (brushing hair).        OT Diagnosis: Generalized weakness;Paresis   OT Problem List: Decreased strength;Decreased activity tolerance;Impaired balance (sitting and/or standing);Decreased cognition;Decreased safety awareness;Decreased knowledge of use of DME or AE;Decreased knowledge of precautions;Impaired UE functional use;Pain   OT Treatment/Interventions: Self-care/ADL training;Therapeutic exercise;Neuromuscular  education;DME and/or AE instruction;Energy conservation;Therapeutic activities;Cognitive remediation/compensation;Patient/family education;Balance training    OT Goals(Current goals can be found in the care plan section) Acute Rehab OT Goals Patient Stated Goal: To get stronger OT Goal Formulation: With patient/family Time For Goal Achievement: 08/22/15 Potential to Achieve Goals: Good ADL Goals Pt Will Perform Eating: with set-up;sitting Pt Will Perform Grooming: sitting;with min assist Pt Will Perform Upper Body Bathing: with min assist;sitting Pt Will Perform Upper Body Dressing: with min assist;sitting Pt Will Transfer to Toilet: with min assist;ambulating (3n1 over toilet) Pt Will Perform Toileting - Clothing Manipulation and hygiene: with mod assist;sit to/from stand Pt/caregiver will Perform Home Exercise Program: Increased strength;Left upper extremity;With written HEP provided  OT Frequency: Min 2X/week   Barriers to D/C:            Co-evaluation              End of Session Equipment Utilized During Treatment: Gait belt;Rolling walker  Activity Tolerance: Other (comment) (dizziness OOB) Patient left: in bed;with call bell/phone within reach;with bed alarm set;with family/visitor present   Time: 1210-1232 OT Time Calculation (min): 22 min Charges:  OT General Charges $OT Visit: 1 Procedure OT Evaluation $OT Eval Moderate Complexity: 1 Procedure G-Codes: OT G-codes **NOT FOR INPATIENT CLASS** Functional Assessment Tool Used: clinical judgement Functional Limitation: Self care Self Care Current Status [id number]): At least 40 percent but less than 60 percent impaired, limited or restricted Self Care Goal Status [id number]): At least 20 percent but less than 40 percent impaired, limited or restricted  Hortencia Pilar 08/08/2015,  1:50 PM

## 2015-08-08 NOTE — Progress Notes (Signed)
STROKE TEAM PROGRESS NOTE   HISTORY OF PRESENT ILLNESS [name] is an 82 y.o. female history of atrial fibrillation on Eliquis, hypertension, previous cerebral stroke with left-sided residual weakness and hyperlipidemia, brought to the emergency room and code stroke status following the acute onset of worsening of left-sided weakness as well as slurred speech. Patient was last known well at 12 AM today 08/07/2015. She reportedly fell when she comes out of bed at 1245. Deficits of subsequently improved. Slurred speech has resolved. CT scan of the head showed no acute intracranial abnormality. She still hadn't motor and sensory changes on the left as well as mild left facial weakness. NIH stroke score was 4. Patient was not administered IV t-PA secondary to on anticoagulation with Eliquis. She was admitted for further evaluation and treatment.   SUBJECTIVE (INTERVAL HISTORY) Two daughters at the bedside. They report the patient has been retaining urine. Apparently the patient was also confused this morning, but much improved now. Pt did not get good sleep last night and seems to have delirium at night.    OBJECTIVE Temp:  [97.8 F (36.6 C)-98.9 F (37.2 C)] 98 F (36.7 C) (05/12 1339) Pulse Rate:  [75-88] 88 (05/12 1339) Cardiac Rhythm:  [-] Atrial fibrillation (05/12 0800) Resp:  [16-20] 18 (05/12 1339) BP: (148-178)/(60-92) 159/81 mmHg (05/12 1339) SpO2:  [96 %-98 %] 98 % (05/12 1339)  CBC:  Recent Labs Lab 08/07/15 0135  08/07/15 0551 08/08/15 0539  WBC 5.0  --  4.3 5.3  NEUTROABS 2.6  --   --  3.0  HGB 12.7  < > 11.8* 13.0  HCT 39.0  < > 36.3 40.1  MCV 95.8  --  95.0 96.2  PLT 177  --  155 172  < > = values in this interval not displayed.  Basic Metabolic Panel:   Recent Labs Lab 08/07/15 0551 08/08/15 0539 08/08/15 1303  NA 144 148* 143  K 3.9 4.0 3.9  CL 108 108 107  CO2 26 29 26   GLUCOSE 101* 95 136*  BUN 15 9 8   CREATININE 0.80 0.74 0.76  CALCIUM 9.1 8.7*  8.4*  MG 2.0 2.1  --     Lipid Panel:     Component Value Date/Time   CHOL 139 08/07/2015 0551   TRIG 50 08/07/2015 0551   HDL 47 08/07/2015 0551   CHOLHDL 3.0 08/07/2015 0551   VLDL 10 08/07/2015 0551   LDLCALC 82 08/07/2015 0551   HgbA1c:  Lab Results  Component Value Date   HGBA1C 5.7* 08/07/2015   Urine Drug Screen:     Component Value Date/Time   LABOPIA NONE DETECTED 12/18/2014 0225   COCAINSCRNUR NONE DETECTED 12/18/2014 0225   LABBENZ NONE DETECTED 12/18/2014 0225   AMPHETMU NONE DETECTED 12/18/2014 0225   THCU NONE DETECTED 12/18/2014 0225   LABBARB NONE DETECTED 12/18/2014 0225      IMAGING I have personally reviewed the radiological images below and agree with the radiology interpretations.  Ct Head Wo Contrast 08/07/2015  IMPRESSION: No acute intracranial hemorrhage. Age-related atrophy and chronic microvascular ischemic disease. Left occipital old infarct and encephalomalacia. If symptoms persist and there are no contraindications, MRI may provide better evaluation if clinically indicated.   Carotid Doppler   There is 1-39% bilateral ICA stenosis. Vertebral artery flow is antegrade.    2D Echocardiogram  08/07/2015  Study Conclusions - Left ventricle: The cavity size was normal. Wall thickness was  increased in a pattern of moderate LVH. There was  mild concentric  hypertrophy. Systolic function was normal. The estimated ejection  fraction was in the range of 55% to 60%. Wall motion was normal;  there were no regional wall motion abnormalities. - Mitral valve: There was moderate regurgitation. - Left atrium: The atrium was moderately to severely dilated. - Tricuspid valve: There was moderate regurgitation. - Pulmonary arteries: Systolic pressure was mildly increased. PA  peak pressure: 42 mm Hg (S). - Pericardium, extracardiac: A small pericardial effusion was  identified circumferential to the heart. Impressions: - No cardiac source of emboli  was indentified. Except that patient  appears to be in chronic atrial flutter/fib that could be a  cardiac source.  MRI and MRA  08/08/2015 MRI HEAD:  No acute intracranial process, specifically no acute ischemia. Chronic changes including old RIGHT basal ganglia lacunar infarct and old LEFT parieto-occipital lobe infarct (MCA versus watershed territory). Severe chronic small vessel ischemic disease. MRA HEAD:  No emergent large vessel occlusion. Stable chronic changes including tandem high-grade stenosis anterior cerebral arteries, moderate luminal irregularity of the mid to distal MCA branches and moderate stenosis LEFT P2 segment.   PHYSICAL EXAM  Temp:  [97.8 F (36.6 C)-98.9 F (37.2 C)] 98 F (36.7 C) (05/12 1339) Pulse Rate:  [75-88] 88 (05/12 1339) Resp:  [16-20] 18 (05/12 1339) BP: (148-178)/(60-92) 159/81 mmHg (05/12 1339) SpO2:  [96 %-98 %] 98 % (05/12 1339)  General - Well nourished, well developed, in no apparent distress.  Ophthalmologic - Fundi not visualized due to noncooperation.  Cardiovascular - Regular rate and rhythm with no murmur, not in afib.  Mental Status -  Level of arousal and orientation to month, place, and person were intact, not orientated to year. Language including expression, naming, repetition, comprehension was assessed and found intact. Fund of Knowledge was assessed and was impaired.  Cranial Nerves II - XII - II - Visual field intact OU. III, IV, VI - Extraocular movements intact. V - Facial sensation intact bilaterally. VII - Facial movement intact bilaterally VIII - Hearing & vestibular intact bilaterally. X - Palate elevates symmetrically. XI - Chin turning & shoulder shrug intact bilaterally. XII - Tongue protrusion intact.  Motor Strength - The patient's strength was 5-/5 LUE and LLE, and 5/5 RUE and RLE and pronator drift was absent.  Bulk was normal and fasciculations were absent.   Motor Tone - Muscle tone was assessed  at the neck and appendages and was normal.  Reflexes - The patient's reflexes were 1+ in all extremities and she had no pathological reflexes.  Sensory - Light touch, temperature/pinprick were assessed and were symmetrical.    Coordination - The patient had normal movements in the hands with no ataxia or dysmetria.  Tremor was absent.  Gait and Station - not tested due to safety concerns   ASSESSMENT/PLAN [name] is a 82 y.o. female with history of atrial fibrillation on Eliquis, hypertension, previous cerebral stroke with left-sided residual weakness and hyperlipidemia presenting with slurred speech and worsening of left-sided weakness. She did not receive IV t-PA due to anticoagulation on Eliquis.   Recrudesce of previous stroke  Resultant - back to baseline weakness  MRI No acute intracranial process  MRA  unremarkable  Carotid Doppler  No significant stenosis   2D Echo EF 55-60%. No cardiac source of emboli identified.  LDL 82  HgbA1c 5.7  SCDs added for VTE prophylaxis as currently off eliquis Diet regular Room service appropriate?: Yes; Fluid consistency:: Thin  Eliquis (apixaban) daily prior  to admission, now resumption of eliquis. Continue eliquis on discharge.  Ongoing aggressive stroke risk factor management  Therapy recommendations:  SNF recommended  Disposition:  pending  (lives with 2 daughtes)  Followed by Dr. Leonie Man as an OP in two months  Atrial Flutter  Home anticoagulation:  Eliquis (apixaban) daily   eliquis resumed   Follow up with Dr. Einar Gip as outpt  Hx stroke/TIA/recrudescence of stroke  01/2015 seen by DR. Sethi as an OP, stroke prevention, continue Eliquis  11/2014 MRI neg. B ACA stenosis  08/2013 MRI neg. MRA with atherosclerosis  02/2013 L occipital temporal subcortical infarct. ? Subacute hemorrhage secondary fall in Nov  01/2013 MRI x 2 neg. R ACA stenosis  07/2012 R corona radiata infarct. MRA  neg  Hypertension  Stable  BP goal normotensive  Hyperlipidemia  Home meds:  No statin  LDL 82, goal < 70  hx hyperlipidemia, on lipitor in the past  Restart lipitor 10mg  daily  Other Stroke Risk Factors  Advanced age  Former Cigarette smoker  Family hx stroke (mother)  Migraines on depakote  Depakote for seizure prophylaxis  Other Active Problems  Mild cognitive impairment with memory loss. unable to tolerate aricept due to GI upset. Having intermittent auditory hallucinations   Urge incontinence   Hospital day # 1  Neurology will sign off. Please call with questions. Pt will follow up with Dr. Leonie Man at [facility] in about 2 months. Thanks for the consult.  Rosalin Hawking, MD PhD Stroke Neurology 08/08/2015 7:11 PM    To contact Stroke Continuity provider, please refer to [email or web address] After hours, contact General Neurology

## 2015-08-09 DIAGNOSIS — R55 Syncope and collapse: Secondary | ICD-10-CM | POA: Diagnosis not present

## 2015-08-09 DIAGNOSIS — I69354 Hemiplegia and hemiparesis following cerebral infarction affecting left non-dominant side: Secondary | ICD-10-CM | POA: Diagnosis not present

## 2015-08-09 DIAGNOSIS — I10 Essential (primary) hypertension: Secondary | ICD-10-CM | POA: Diagnosis not present

## 2015-08-09 DIAGNOSIS — G43909 Migraine, unspecified, not intractable, without status migrainosus: Secondary | ICD-10-CM | POA: Diagnosis present

## 2015-08-09 DIAGNOSIS — R112 Nausea with vomiting, unspecified: Secondary | ICD-10-CM | POA: Diagnosis not present

## 2015-08-09 DIAGNOSIS — Z87440 Personal history of urinary (tract) infections: Secondary | ICD-10-CM | POA: Diagnosis not present

## 2015-08-09 DIAGNOSIS — N3941 Urge incontinence: Secondary | ICD-10-CM | POA: Diagnosis present

## 2015-08-09 DIAGNOSIS — R350 Frequency of micturition: Secondary | ICD-10-CM | POA: Diagnosis not present

## 2015-08-09 DIAGNOSIS — F0390 Unspecified dementia without behavioral disturbance: Secondary | ICD-10-CM | POA: Diagnosis present

## 2015-08-09 DIAGNOSIS — Z5189 Encounter for other specified aftercare: Secondary | ICD-10-CM | POA: Diagnosis not present

## 2015-08-09 DIAGNOSIS — G3184 Mild cognitive impairment, so stated: Secondary | ICD-10-CM | POA: Diagnosis present

## 2015-08-09 DIAGNOSIS — I4892 Unspecified atrial flutter: Secondary | ICD-10-CM | POA: Diagnosis not present

## 2015-08-09 DIAGNOSIS — R531 Weakness: Secondary | ICD-10-CM | POA: Diagnosis not present

## 2015-08-09 DIAGNOSIS — R488 Other symbolic dysfunctions: Secondary | ICD-10-CM | POA: Diagnosis not present

## 2015-08-09 DIAGNOSIS — R110 Nausea: Secondary | ICD-10-CM | POA: Diagnosis not present

## 2015-08-09 DIAGNOSIS — I4891 Unspecified atrial fibrillation: Secondary | ICD-10-CM | POA: Diagnosis present

## 2015-08-09 DIAGNOSIS — I951 Orthostatic hypotension: Secondary | ICD-10-CM

## 2015-08-09 DIAGNOSIS — Z886 Allergy status to analgesic agent status: Secondary | ICD-10-CM | POA: Diagnosis not present

## 2015-08-09 DIAGNOSIS — Z87891 Personal history of nicotine dependence: Secondary | ICD-10-CM | POA: Diagnosis not present

## 2015-08-09 DIAGNOSIS — M6281 Muscle weakness (generalized): Secondary | ICD-10-CM | POA: Diagnosis not present

## 2015-08-09 DIAGNOSIS — R2681 Unsteadiness on feet: Secondary | ICD-10-CM | POA: Diagnosis not present

## 2015-08-09 DIAGNOSIS — Z888 Allergy status to other drugs, medicaments and biological substances status: Secondary | ICD-10-CM | POA: Diagnosis not present

## 2015-08-09 DIAGNOSIS — E785 Hyperlipidemia, unspecified: Secondary | ICD-10-CM | POA: Diagnosis present

## 2015-08-09 DIAGNOSIS — K219 Gastro-esophageal reflux disease without esophagitis: Secondary | ICD-10-CM | POA: Diagnosis not present

## 2015-08-09 DIAGNOSIS — M6289 Other specified disorders of muscle: Secondary | ICD-10-CM | POA: Diagnosis not present

## 2015-08-09 DIAGNOSIS — I442 Atrioventricular block, complete: Secondary | ICD-10-CM | POA: Diagnosis present

## 2015-08-09 DIAGNOSIS — I482 Chronic atrial fibrillation: Secondary | ICD-10-CM | POA: Diagnosis not present

## 2015-08-09 DIAGNOSIS — Z7901 Long term (current) use of anticoagulants: Secondary | ICD-10-CM | POA: Diagnosis not present

## 2015-08-09 DIAGNOSIS — I952 Hypotension due to drugs: Secondary | ICD-10-CM | POA: Diagnosis not present

## 2015-08-09 DIAGNOSIS — T446X5A Adverse effect of alpha-adrenoreceptor antagonists, initial encounter: Secondary | ICD-10-CM | POA: Diagnosis not present

## 2015-08-09 DIAGNOSIS — Z79899 Other long term (current) drug therapy: Secondary | ICD-10-CM | POA: Diagnosis not present

## 2015-08-09 DIAGNOSIS — R29704 NIHSS score 4: Secondary | ICD-10-CM | POA: Diagnosis present

## 2015-08-09 DIAGNOSIS — R440 Auditory hallucinations: Secondary | ICD-10-CM | POA: Diagnosis not present

## 2015-08-09 DIAGNOSIS — N3000 Acute cystitis without hematuria: Secondary | ICD-10-CM | POA: Diagnosis not present

## 2015-08-09 DIAGNOSIS — R4781 Slurred speech: Secondary | ICD-10-CM | POA: Diagnosis present

## 2015-08-09 DIAGNOSIS — G9340 Encephalopathy, unspecified: Secondary | ICD-10-CM | POA: Diagnosis not present

## 2015-08-09 DIAGNOSIS — M810 Age-related osteoporosis without current pathological fracture: Secondary | ICD-10-CM | POA: Diagnosis present

## 2015-08-09 DIAGNOSIS — F05 Delirium due to known physiological condition: Secondary | ICD-10-CM | POA: Diagnosis not present

## 2015-08-09 LAB — BASIC METABOLIC PANEL
Anion gap: 10 (ref 5–15)
BUN: 10 mg/dL (ref 6–20)
CALCIUM: 8.3 mg/dL — AB (ref 8.9–10.3)
CO2: 25 mmol/L (ref 22–32)
CREATININE: 0.76 mg/dL (ref 0.44–1.00)
Chloride: 109 mmol/L (ref 101–111)
GFR calc Af Amer: >60 mL/min (ref >60)
Glucose, Bld: 102 mg/dL — ABNORMAL HIGH (ref 65–99)
POTASSIUM: 4.1 mmol/L (ref 3.5–5.1)
SODIUM: 144 mmol/L (ref 135–145)

## 2015-08-09 LAB — MAGNESIUM: Magnesium: 2 mg/dL (ref 1.7–2.4)

## 2015-08-09 LAB — CBC WITH DIFFERENTIAL/PLATELET
Basophils Absolute: 0 10*3/uL (ref 0.0–0.1)
Basophils Relative: 1 %
EOS ABS: 0.1 10*3/uL (ref 0.0–0.7)
EOS PCT: 1 %
HCT: 38.4 % (ref 36.0–46.0)
Hemoglobin: 12.4 g/dL (ref 12.0–15.0)
LYMPHS ABS: 1.1 10*3/uL (ref 0.7–4.0)
LYMPHS PCT: 22 %
MCH: 30.2 pg (ref 26.0–34.0)
MCHC: 32.3 g/dL (ref 30.0–36.0)
MCV: 93.7 fL (ref 78.0–100.0)
MONO ABS: 0.7 10*3/uL (ref 0.1–1.0)
MONOS PCT: 14 %
Neutro Abs: 3.1 10*3/uL (ref 1.7–7.7)
Neutrophils Relative %: 62 %
PLATELETS: 164 10*3/uL (ref 150–400)
RBC: 4.1 MIL/uL (ref 3.87–5.11)
RDW: 14.2 % (ref 11.5–15.5)
WBC: 4.9 10*3/uL (ref 4.0–10.5)

## 2015-08-09 MED ORDER — PHENAZOPYRIDINE HCL 100 MG PO TABS
100.0000 mg | ORAL_TABLET | Freq: Three times a day (TID) | ORAL | Status: DC
Start: 2015-08-09 — End: 2015-08-10
  Administered 2015-08-09 – 2015-08-10 (×3): 100 mg via ORAL
  Filled 2015-08-09 (×4): qty 1

## 2015-08-09 MED ORDER — SODIUM CHLORIDE 0.9 % IV SOLN
INTRAVENOUS | Status: DC
  Administered 2015-08-09 – 2015-08-10 (×3): via INTRAVENOUS

## 2015-08-09 MED ORDER — SODIUM CHLORIDE 0.9 % IV BOLUS (SEPSIS)
500.0000 mL | Freq: Once | INTRAVENOUS | Status: AC
  Administered 2015-08-09: 500 mL via INTRAVENOUS

## 2015-08-09 NOTE — Progress Notes (Signed)
[facility] Progress Note  Patient: [name] [id number]   PCP: Gildardo Cranker, DO DOB: 12-18-1924   DOA: 08/07/2015   DOS: 08/09/2015   Date of Service: the patient was seen and examined on 08/09/2015  Subjective: Patient had near syncopal event this morning. Denies any other acute complaint. No other events identified. Telemetry was unremarkable at the time of the event. Nutrition: Tolerating oral diet  Brief hospital course: Patient was admitted on 08/07/2015, with complaint of infusion and weakness of the left side, was found to have recrudescence of her prior CVA without any new CVA. Currently further plan is monitoring for improvement in her encephalopathy.  Assessment and Plan: 1. Left-sided weakness Most likely due to recrudescence from patient's prior CVA due to UTI. MRI brain negative for any acute stroke. Speech therapy recommended regular oral diet. PTOT recommends SNF. Social worker consulted. Continue Apixaban and as well as statin.  2. UTI. Treat with oral Keflex.  3. Urinary retention. Likely due to oxybutynin as well as myrbetriq  This can also cause recurrent urinary infection. Recommend to stop this medication and treat the patient with Flomax.   4. Orthostatic hypertension. Likely secondary to Flomax initiation. Patient was given IV bolus and will continue with overnight IV fluids. Recheck orthostatic in the morning.  Pain management: When necessary Tylenol Activity: physical therapy recommends SNF Bowel regimen: last BM 08/07/2015 Diet: Regular diet DVT Prophylaxis: Therapeutic anticoagulation  Advance goals of care discussion: Partial code  Family Communication: family was present at bedside, at the time of interview. The pt provided permission to discuss medical plan with the family. Opportunity was given to ask question and all questions were answered satisfactorily.   Disposition:  Discharge to SNF versus home with home health Expected  discharge date: 08/10/2015   Consultants: Neurology Procedures: Echocardiogram  Antibiotics: Anti-infectives    Start     Dose/Rate Route Frequency Ordered Stop   08/08/15 0900  cefTRIAXone (ROCEPHIN) 1 g in dextrose 5 % 50 mL IVPB     1 g 100 mL/hr over 30 Minutes Intravenous Daily 08/08/15 0745     08/07/15 0315  cefTRIAXone (ROCEPHIN) 1 g in dextrose 5 % 50 mL IVPB     1 g 100 mL/hr over 30 Minutes Intravenous  Once 08/07/15 0306 08/07/15 0403        Intake/Output Summary (Last 24 hours) at 08/09/15 2031 Last data filed at 08/09/15 1941  Gross per 24 hour  Intake 625.83 ml  Output    130 ml  Net 495.83 ml   There were no vitals filed for this visit.  Objective: Physical Exam: Filed Vitals:   08/09/15 0901 08/09/15 1341 08/09/15 1851 08/09/15 1948  BP: 137/64 153/70 131/57 146/67  Pulse: 80 81 82 78  Temp: 97.9 F (36.6 C) 98.1 F (36.7 C) 98.1 F (36.7 C) 98.2 F (36.8 C)  TempSrc: Oral Oral Oral Oral  Resp: 20 20 20 20   Height:      SpO2: 94% 98% 96% 98%   General: Alert, Awake and Oriented to Time, Place and Person. Appear in mild distress Eyes: PERRL, Conjunctiva normal ENT: Oral Mucosa clear moist. Neck: no JVD, no Abnormal Mass Or lumps Cardiovascular: S1 and S2 Present, no Murmur, Peripheral Pulses Present Respiratory: Bilateral Air entry equal and Decreased,  Clear to Auscultation, no Crackles, no wheezes Abdomen: Bowel Sound present, Soft and no tenderness Skin: redness no, no Rash  Extremities: no Pedal edema, no calf tenderness Neurologic: Grossly no focal neuro  deficit. Left-sided weakness  Data Reviewed: CBC:  Recent Labs Lab 08/07/15 0135 08/07/15 0203 08/07/15 0551 08/08/15 0539 08/09/15 0633  WBC 5.0  --  4.3 5.3 4.9  NEUTROABS 2.6  --   --  3.0 3.1  HGB 12.7 14.3 11.8* 13.0 12.4  HCT 39.0 42.0 36.3 40.1 38.4  MCV 95.8  --  95.0 96.2 93.7  PLT 177  --  155 172 [id number]   Basic Metabolic Panel:  Recent Labs Lab 08/07/15 0135  08/07/15 0203 08/07/15 0551 08/08/15 0539 08/08/15 1303 08/09/15 0633  NA 145 145 144 148* 143 144  K 4.3 4.2 3.9 4.0 3.9 4.1  CL 108 107 108 108 107 109  CO2 27  --  26 29 26 25   GLUCOSE 104* 96 101* 95 136* 102*  BUN 19 24* 15 9 8 10   CREATININE 0.99 1.00 0.80 0.74 0.76 0.76  CALCIUM 9.9  --  9.1 8.7* 8.4* 8.3*  MG  --   --  2.0 2.1  --  2.0    Liver Function Tests:  Recent Labs Lab 08/07/15 0135 08/08/15 0539  AST 24 23  ALT 11* 12*  ALKPHOS 52 43  BILITOT 0.5 0.6  PROT 6.1* 6.0*  ALBUMIN 3.5 3.3*   No results for input(s): LIPASE, AMYLASE in the last 168 hours.  Recent Labs Lab 08/08/15 1303  AMMONIA 16   Coagulation Profile:  Recent Labs Lab 08/07/15 0135  INR 1.13   Cardiac Enzymes: No results for input(s): CKTOTAL, CKMB, CKMBINDEX, TROPONINI in the last 168 hours. BNP (last 3 results) No results for input(s): PROBNP in the last 8760 hours.  CBG:  Recent Labs Lab 08/07/15 0202  GLUCAP 102*    Studies: No results found.   Scheduled Meds: . apixaban  2.5 mg Oral BID  . atorvastatin  10 mg Oral q1800  . cefTRIAXone (ROCEPHIN)  IV  1 g Intravenous Daily  . divalproex  250 mg Oral BID  . phenazopyridine  100 mg Oral TID WC   Continuous Infusions: . sodium chloride 50 mL/hr at 08/09/15 1428   PRN Meds: acetaminophen **OR** acetaminophen, diphenhydrAMINE, hydrocortisone, senna-docusate  Time spent: 30 minutes  Author: Berle Mull, MD [facility] Pager: [phone] 08/09/2015 8:31 PM  If 7PM-7AM, please contact night-coverage at [email or web address], password [facility]

## 2015-08-09 NOTE — Evaluation (Signed)
Speech Language Pathology Evaluation Patient Details Name: [name] MRN: [id number] DOB: 1924-05-19 Today's Date: 08/09/2015 Time: [id number] SLP Time Calculation (min) (ACUTE ONLY): 11 min  Problem List:  Patient Active Problem List   Diagnosis Date Noted  . Acute cystitis without hematuria   . HLD (hyperlipidemia)   . External hemorrhoids without complication [id number]  . Abdominal pain 12/18/2014  . Nausea without vomiting 12/18/2014  . UTI (lower urinary tract infection) 12/18/2014  . Urinary tract infectious disease   . Chest pain 10/31/2014  . Nausea and vomiting 10/31/2014  . Nausea with vomiting   . Mild cognitive impairment with memory loss 07/25/2014  . Malnourished ([place]) 07/25/2014  . Recurrent UTI 08/07/2013  . History of CVA (cerebrovascular accident) 06/02/2013  . Unspecified constipation 03/16/2013  . Left-sided weakness 03/09/2013  . Possible Seizures 02/14/2013  . TIA (transient ischemic attack) 02/12/2013  . Hyperglycemia 02/12/2013  . High cholesterol   . Atrial fibrillation ([place]) 02/05/2013  . Chronic anticoagulation 02/05/2013  . HTN (hypertension) 11/14/2012  . Right ureteral stone 09/15/2010  . Atrial flutter ([place]) 07/17/2010  . Osteopenia of the elderly 06/03/2009  . MIGRAINE HEADACHE 02/05/2008  . CATARACTS, BILATERAL 02/05/2008  . DECREASED HEARING 02/05/2008  . GEN OSTEOARTHROSIS INVOLVING MULTIPLE SITES 02/05/2008  . COLONIC POLYPS, HX OF 02/05/2008   Past Medical History:  Past Medical History  Diagnosis Date  . Arthritis   . Hypertension   . Migraine   . Colon polyp   . Hearing difficulty   . Torus palatinus   . Atrial flutter ([place]) January, 2012  . Stroke ([place]) 08/02/12     right lenticular nucleus infarct  . Left leg weakness 02/05/2013  . Chronic anticoagulation   . High cholesterol   . Atrial fibrillation ([place])     on Eliquis Rx  . Mild cognitive impairment with memory loss   . Malnutrition ([place])   . TIA (transient  ischemic attack)   . Constipation   . Osteoporosis   . Hemorrhoids    Past Surgical History:  Past Surgical History  Procedure Laterality Date  . Cataract extraction w/ intraocular lens  implant, bilateral  2006-2008  . Ganglion cyst excision Bilateral 1938,1954,2003,2005    "wrists/hand" (08/01/2012)  . Cardioversion  05/19/2010    Dr. Einar Gip  . Tonsillectomy  ~ 1935  . Appendectomy  02/19/53    `  . Mouth surgery      [name]   HPI:  82 y.o. female history of atrial fibrillation on Eliquis, hypertension, previous cerebral stroke with left-sided residual weakness and hyperlipidemia, brought to the emergency room and code stroke status following the acute onset of worsening of left-sided weakness as well as slurred speech. MRI negative for acute process, old R lacunar basal ganglia and L parieto occipital lobe infarcts seen.   Assessment / Plan / Recommendation Clinical Impression  Pt alert, conversive and good sense of humor. Daughters present who report pt has baseline memory impairments and provide full assist for mangement of finances and medications. Pt at baseline level of function with appropriate support at home. No ST warranted.       SLP Assessment  Patient does not need any further Speech Lanaguage Pathology Services    Follow Up Recommendations  None    Frequency and Duration           SLP Evaluation Prior Functioning  Cognitive/Linguistic Baseline: Baseline deficits (memory) Baseline deficit details: memory Type of Home: House  Lives With: Daughter Available Help at  Discharge: Family;Available 24 hours/day   Cognition  Overall Cognitive Status: No family/caregiver present to determine baseline cognitive functioning (dts state cognition is baseline today) Arousal/Alertness: Awake/alert Orientation Level: Oriented to person;Oriented to place Attention: Sustained Sustained Attention: Appears intact Memory: Impaired Memory Impairment:  (baseline  deficits) Awareness: Impaired Awareness Impairment: Anticipatory impairment Problem Solving:  (verbal > functional)    Comprehension  Auditory Comprehension Overall Auditory Comprehension: Appears within functional limits for tasks assessed Interfering Components: Hearing Visual Recognition/Discrimination Discrimination: Not tested Reading Comprehension Reading Status: Not tested    Expression Expression Primary Mode of Expression: Verbal Verbal Expression Overall Verbal Expression: Appears within functional limits for tasks assessed Initiation: No impairment Level of Generative/Spontaneous Verbalization: Conversation Pragmatics: No impairment Written Expression Dominant Hand: Right Written Expression: Not tested   Oral / Motor  Oral Motor/Sensory Function Overall Oral Motor/Sensory Function: Within functional limits Motor Speech Overall Motor Speech: Appears within functional limits for tasks assessed Intelligibility: Intelligible Motor Planning: Witnin functional limits   GO          Functional Assessment Tool Used: skilled clinicl judgment Functional Limitations: Memory Swallow Current Status [id number]): At least 1 percent but less than 20 percent impaired, limited or restricted Swallow Goal Status [phone]): At least 1 percent but less than 20 percent impaired, limited or restricted Swallow Discharge Status [phone]): At least 1 percent but less than 20 percent impaired, limited or restricted Memory Current Status [id number]): At least 60 percent but less than 80 percent impaired, limited or restricted Memory Goal Status [id number]): At least 60 percent but less than 80 percent impaired, limited or restricted Memory Discharge Status [phone]): At least 60 percent but less than 80 percent impaired, limited or restricted         Houston Siren 08/09/2015, 3:18 PM  Orbie Pyo Colvin Caroli.Ed [employer] [phone]

## 2015-08-10 LAB — BASIC METABOLIC PANEL
Anion gap: 10 (ref 5–15)
BUN: 7 mg/dL (ref 6–20)
CALCIUM: 7.9 mg/dL — AB (ref 8.9–10.3)
CO2: 24 mmol/L (ref 22–32)
Chloride: 111 mmol/L (ref 101–111)
Creatinine, Ser: 0.65 mg/dL (ref 0.44–1.00)
GFR calc Af Amer: >60 mL/min (ref >60)
GFR calc non Af Amer: >60 mL/min (ref >60)
GLUCOSE: 92 mg/dL (ref 65–99)
Potassium: 3.9 mmol/L (ref 3.5–5.1)
Sodium: 145 mmol/L (ref 135–145)

## 2015-08-10 MED ORDER — ONDANSETRON HCL 4 MG/2ML IJ SOLN
INTRAMUSCULAR | Status: AC
  Filled 2015-08-10: qty 2

## 2015-08-10 MED ORDER — MIRABEGRON ER 25 MG PO TB24
25.0000 mg | ORAL_TABLET | Freq: Every day | ORAL | Status: DC
  Administered 2015-08-10 – 2015-08-12 (×3): 25 mg via ORAL
  Filled 2015-08-10 (×3): qty 1

## 2015-08-10 MED ORDER — ONDANSETRON HCL 4 MG/2ML IJ SOLN
4.0000 mg | Freq: Four times a day (QID) | INTRAMUSCULAR | Status: DC | PRN
Start: 2015-08-10 — End: 2015-08-12
  Administered 2015-08-10: 4 mg via INTRAVENOUS

## 2015-08-10 NOTE — Clinical Social Work Note (Signed)
Clinical Social Work Assessment  Patient Details  Name: [name] MRN: [id number] Date of Birth: June 28, 1924  Date of referral:  08/03/15               Reason for consult:  Facility Placement                Permission sought to share information with:  Family Supports Permission granted to share information::  Yes, Verbal Permission Granted  Name::     [facility] [facility],  3 Daughters       Housing/Transportation Living arrangements for the past 2 months:  Single Family Home Source of Information:  Adult Children Patient Interpreter Needed:  None Criminal Activity/Legal Involvement Pertinent to Current Situation/Hospitalization:  No - Comment as needed Significant Relationships:  Adult Children Lives with:  Adult Children (3 daughters) Do you feel safe going back to the place where you live?  Yes (After rehab) Need for family participation in patient care:  Yes (Comment)  Care giving concerns:  Patient is very weak and currently having bladder issues that daughters do not feel they can manage at home.   Social Worker assessment / plan:  CSW met with 2 of patient's daughters in patient's room today to discuss PT's recommendation for short term SNF. CSW notified by MD that patient was originally Observation Status and was changed to Admit yesterday.  Patient lives with her 3 daughters and has 24 hour care at home- however- issues with her bladder has caused her daughters to be alarmed about managing her care at home.  Discussed SNF recommendation and she has been placed in the past per daughters. Discussed bed search process and need for patient to have a qualified 3 day hospital stay to obtain Medicare placement.  Per MD note- patient is not anticipated to be d/c'd until 08/12/15.  Fl2 will be completed and placed on chart for MD's signature. Active bed search in place.  Daughters wish to consider [facility] or [facility] as patient has been there in the past but are open to other bed offers as  well including [facility]. Patient was sleeping deeply during the visit today and did not arouse during the lengthy conversation.  Employment status:  Retired Health and safety inspector:  [employer] PT Recommendations:  Skilled Nursing Facility Information / Referral to community resources:  Skilled Nursing Facility  Patient/Family's Response to care:  Both daughters verbalized frustration that patient has been "diagnosed" frequently with UTI's- they are unsure if that is the true reason for her deteriorating health. They state that a CVA was ruled out and this was a significant relief to them.  Patient/Family's Understanding of and Emotional Response to Diagnosis, Current Treatment, and Prognosis: Both daughters verbalized a strong understanding of patient's current and past medical conditions.  State that they feel she is being medically treated but unsure as to true causes of her poor condition.  Daughters strongly support need for SNF placement for short term rehab.    Emotional Assessment Appearance:  Appears stated age Attitude/Demeanor/Rapport:   (Sleeping deeply during visit-did not arouse. ) Affect (typically observed):   (Sleeping deeply during visit; did not arouse) Orientation:  Oriented to Self Alcohol / Substance use:  Never Used Psych involvement (Current and /or in the community):  No (Comment)  Discharge Needs  Concerns to be addressed:  Care Coordination Readmission within the last 30 days:  No Current discharge risk:  Dependent with Mobility Barriers to Discharge:  Continued Medical Work up   [employer],  Lorie Phenix, LCSW 08/10/2015, 4:45 PM

## 2015-08-10 NOTE — Progress Notes (Addendum)
[facility] Progress Note  Patient: [name] [id number]   PCP: Gildardo Cranker, DO DOB: Jan 20, 1925   DOA: 08/07/2015   DOS: 08/10/2015   Date of Service: the patient was seen and examined on 08/10/2015  Subjective: Patient underwent of nausea earlier in the morning with minimal oral intake and had an episode of vomiting after that. Denies any other acute complaint. No chest pain or abdominal pain no diarrhea no constipation. Had significantly increasing urinary frequency as well as occasional episodes of incontinence. Nutrition: Tolerating oral diet  Brief hospital course: Patient was admitted on 08/07/2015, with complaint of infusion and weakness of the left side, was found to have recrudescence of her prior CVA without any new CVA. Currently further plan is monitoring for improvement in her encephalopathy.  Assessment and Plan: 1. Left-sided weakness Most likely due to recrudescence from patient's prior CVA due to UTI. MRI brain negative for any acute stroke. Speech therapy recommended regular oral diet. PTOT recommends SNF. Social worker consulted. Continue Apixaban and as well as statin.  2. UTI. Continue with IV ceftriaxone at present due to persistent nausea and episode of vomiting  3. Urinary retention. Patient is unable to tolerate Flomax due to orthostatic hypertension. And without oxybutynin as well as Myrbetriq the patient has significant urinary frequency. Discussed with patient's as well as family and decided to go with Myrbetriq at Present to reduce the urinary frequency and monitor for 1 week. Patient will need urology follow-up as an outpatient.   4. Orthostatic hypertension. At present resolved. Likely Flomax.  5.Atrial flutter. Occasional bradycardia on telemetry. Telemetry shows evidence of occasional bradycardia without any evidence of significant pause. This is likely secondary to patient's atrial flutter. Next and patient is on chronic  anticoagulation for the same. Avoid medication for rate control.  Pain management: When necessary Tylenol Activity: physical therapy recommends SNF Bowel regimen: last BM 08/09/2015 Diet: Regular diet DVT Prophylaxis: Therapeutic anticoagulation  Advance goals of care discussion: Partial code  Family Communication: family was present at bedside, at the time of interview. The pt provided permission to discuss medical plan with the family. Opportunity was given to ask question and all questions were answered satisfactorily.   Disposition:  Discharge to SNF versus home with home health Expected discharge date: 08/12/2015   Consultants: Neurology Procedures: Echocardiogram  Antibiotics: Anti-infectives    Start     Dose/Rate Route Frequency Ordered Stop   08/08/15 0900  cefTRIAXone (ROCEPHIN) 1 g in dextrose 5 % 50 mL IVPB     1 g 100 mL/hr over 30 Minutes Intravenous Daily 08/08/15 0745     08/07/15 0315  cefTRIAXone (ROCEPHIN) 1 g in dextrose 5 % 50 mL IVPB     1 g 100 mL/hr over 30 Minutes Intravenous  Once 08/07/15 0306 08/07/15 0403       Intake/Output Summary (Last 24 hours) at 08/10/15 1508 Last data filed at 08/09/15 1941  Gross per 24 hour  Intake 260.83 ml  Output      0 ml  Net 260.83 ml   Objective: Physical Exam: Filed Vitals:   08/10/15 0140 08/10/15 0618 08/10/15 1132 08/10/15 1432  BP: 142/69 142/81 150/85 135/65  Pulse: 80 81 77 67  Temp: 98.1 F (36.7 C) 98.8 F (37.1 C) 98.4 F (36.9 C) 98.7 F (37.1 C)  TempSrc: Oral Oral Oral Oral  Resp: 20 20 20 20   Height:      SpO2: 98% 97% 97% 98%   General: Alert, Awake and  Oriented to Time, Place and Person. Appear in Moderate distress Eyes: PERRL, Conjunctiva normal ENT: Oral Mucosa clear moist. Neck: no JVD, no Abnormal Mass Or lumps Cardiovascular: S1 and S2 Present, no Murmur, Respiratory: Bilateral Air entry equal and Decreased,  Clear to Auscultation, no Crackles, no wheezes Abdomen: Bowel  Sound present, Soft and no tenderness Skin: redness no, no Rash  Extremities: no Pedal edema, no calf tenderness Neurologic: Grossly no focal neuro deficit. Left-sided weaknessAppears stable  Data Reviewed: CBC:  Recent Labs Lab 08/07/15 0135 08/07/15 0203 08/07/15 0551 08/08/15 0539 08/09/15 0633  WBC 5.0  --  4.3 5.3 4.9  NEUTROABS 2.6  --   --  3.0 3.1  HGB 12.7 14.3 11.8* 13.0 12.4  HCT 39.0 42.0 36.3 40.1 38.4  MCV 95.8  --  95.0 96.2 93.7  PLT 177  --  155 172 [id number]   Basic Metabolic Panel:  Recent Labs Lab 08/07/15 0551 08/08/15 0539 08/08/15 1303 08/09/15 0633 08/10/15 0530  NA 144 148* 143 144 145  K 3.9 4.0 3.9 4.1 3.9  CL 108 108 107 109 111  CO2 26 29 26 25 24   GLUCOSE 101* 95 136* 102* 92  BUN 15 9 8 10 7   CREATININE 0.80 0.74 0.76 0.76 0.65  CALCIUM 9.1 8.7* 8.4* 8.3* 7.9*  MG 2.0 2.1  --  2.0  --     Liver Function Tests:  Recent Labs Lab 08/07/15 0135 08/08/15 0539  AST 24 23  ALT 11* 12*  ALKPHOS 52 43  BILITOT 0.5 0.6  PROT 6.1* 6.0*  ALBUMIN 3.5 3.3*   No results for input(s): LIPASE, AMYLASE in the last 168 hours.  Recent Labs Lab 08/08/15 1303  AMMONIA 16   Coagulation Profile:  Recent Labs Lab 08/07/15 0135  INR 1.13   Cardiac Enzymes: No results for input(s): CKTOTAL, CKMB, CKMBINDEX, TROPONINI in the last 168 hours. BNP (last 3 results) No results for input(s): PROBNP in the last 8760 hours.  CBG:  Recent Labs Lab 08/07/15 0202  GLUCAP 102*    Studies: No results found.   Scheduled Meds: . apixaban  2.5 mg Oral BID  . atorvastatin  10 mg Oral q1800  . cefTRIAXone (ROCEPHIN)  IV  1 g Intravenous Daily  . divalproex  250 mg Oral BID  . mirabegron ER  25 mg Oral Daily   Continuous Infusions:   PRN Meds: acetaminophen **OR** acetaminophen, diphenhydrAMINE, hydrocortisone, ondansetron (ZOFRAN) IV, senna-docusate  Time spent: 30 minutes  Author: Berle Mull, MD [facility] Pager:  [phone] 08/10/2015 3:08 PM  If 7PM-7AM, please contact night-coverage at [email or web address], password [facility]

## 2015-08-10 NOTE — NC FL2 (Signed)
[place] LEVEL OF CARE SCREENING TOOL     IDENTIFICATION  Patient Name: [name] Birthdate: Jun 09, 1924 Sex: female Admission Date (Current Location): 08/07/2015  [facility] and [place] Number:  Herbalist and Address:  [facility], [place] [address] [place], [place] [address]      Provider Number: [id number]  Attending Physician Name and Address:  Lavina Hamman, MD  Relative Name and Phone Number:       Current Level of Care: Hospital Recommended Level of Care: [place] Prior Approval Number:    Date Approved/Denied:   PASRR Number: [id number]  Discharge Plan: SNF    Current Diagnoses: Patient Active Problem List   Diagnosis Date Noted  . Acute cystitis without hematuria   . HLD (hyperlipidemia)   . External hemorrhoids without complication [id number]  . Abdominal pain 12/18/2014  . Nausea without vomiting 12/18/2014  . UTI (lower urinary tract infection) 12/18/2014  . Urinary tract infectious disease   . Chest pain 10/31/2014  . Nausea and vomiting 10/31/2014  . Nausea with vomiting   . Mild cognitive impairment with memory loss 07/25/2014  . Malnourished ([place]) 07/25/2014  . Recurrent UTI 08/07/2013  . History of CVA (cerebrovascular accident) 06/02/2013  . Unspecified constipation 03/16/2013  . Left-sided weakness 03/09/2013  . Possible Seizures 02/14/2013  . TIA (transient ischemic attack) 02/12/2013  . Hyperglycemia 02/12/2013  . High cholesterol   . Atrial fibrillation ([place]) 02/05/2013  . Chronic anticoagulation 02/05/2013  . HTN (hypertension) 11/14/2012  . Right ureteral stone 09/15/2010  . Atrial flutter ([place]) 07/17/2010  . Osteopenia of the elderly 06/03/2009  . MIGRAINE HEADACHE 02/05/2008  . CATARACTS, BILATERAL 02/05/2008  . DECREASED HEARING 02/05/2008  . GEN OSTEOARTHROSIS INVOLVING MULTIPLE SITES 02/05/2008  . COLONIC POLYPS, HX OF 02/05/2008    Orientation RESPIRATION  BLADDER Height & Weight     Self, Place  Normal Incontinent Weight:   Height:  5\' 6"  (167.6 cm)  BEHAVIORAL SYMPTOMS/MOOD NEUROLOGICAL BOWEL NUTRITION STATUS      Incontinent Diet (Regular)  AMBULATORY STATUS COMMUNICATION OF NEEDS Skin   Limited Assist Verbally Normal                       Personal Care Assistance Level of Assistance  Dressing, Bathing Bathing Assistance: Limited assistance   Dressing Assistance: Limited assistance     Functional Limitations Info             SPECIAL CARE FACTORS FREQUENCY  PT (By licensed PT), OT (By licensed OT)     PT Frequency: 5 OT Frequency: 5            Contractures Contractures Info: Not present    Additional Factors Info  Code Status, Allergies Code Status Info: Partial code Allergies Info: Aricept, Tramadol HCL, Alendronate Sodium           Current Medications (08/10/2015):  This is the current hospital active medication list Current Facility-Administered Medications  Medication Dose Route Frequency Provider Last Rate Last Dose  . acetaminophen (TYLENOL) tablet 650 mg  650 mg Oral Q4H PRN Edwin Dada, MD   650 mg at 08/09/15 0235   Or  . acetaminophen (TYLENOL) suppository 650 mg  650 mg Rectal Q4H PRN Edwin Dada, MD      . apixaban Arne [place]) tablet 2.5 mg  2.5 mg Oral BID Rebecka Apley, RPH   2.5 mg at 08/10/15 [id number]  . atorvastatin (  LIPITOR) tablet 10 mg  10 mg Oral q1800 Lavina Hamman, MD   10 mg at 08/08/15 1740  . cefTRIAXone (ROCEPHIN) 1 g in dextrose 5 % 50 mL IVPB  1 g Intravenous Daily Lavina Hamman, MD   1 g at 08/10/15 0920  . diphenhydrAMINE (BENADRYL) capsule 25 mg  25 mg Oral Q6H PRN Lavina Hamman, MD   25 mg at 08/07/15 1750  . divalproex (DEPAKOTE) DR tablet 250 mg  250 mg Oral BID Lavina Hamman, MD   250 mg at 08/10/15 [id number]  . hydrocortisone (ANUSOL-HC) suppository 25 mg  25 mg Rectal PRN Lavina Hamman, MD      . mirabegron ER [facility]) tablet 25 mg  25 mg Oral Daily  Lavina Hamman, MD   25 mg at 08/10/15 1220  . ondansetron (ZOFRAN) injection 4 mg  4 mg Intravenous Q6H PRN Lavina Hamman, MD   4 mg at 08/10/15 1152  . senna-docusate (Senokot-S) tablet 1 tablet  1 tablet Oral QHS PRN Edwin Dada, MD         Discharge Medications: Please see discharge summary for a list of discharge medications.  Relevant Imaging Results:  Relevant Lab Results:   Additional Information SSN:  [id number]  Williemae Area, LCSW

## 2015-08-10 NOTE — Clinical Social Work Placement (Addendum)
   CLINICAL SOCIAL WORK PLACEMENT  NOTE  Date:  08/10/2015  Patient Details  Name: [name] MRN: [id number] Date of Birth: Jul 05, 1924  Clinical Social Work is seeking post-discharge placement for this patient at the [place] level of care (*CSW will initial, date and re-position this form in  chart as items are completed):  Yes   Patient/family provided with [place] Work Department's list of facilities offering this level of care within the geographic area requested by the patient (or if unable, by the patient's family).  Yes   Patient/family informed of their freedom to choose among providers that offer the needed level of care, that participate in Medicare, Medicaid or managed care program needed by the patient, have an available bed and are willing to accept the patient.      Patient/family informed of [facility]'s ownership interest in [facility] and [facility], as well as of the fact that they are under no obligation to receive care at these facilities.  PASRR submitted to EDS on       PASRR number received on       Existing PASRR number confirmed on 08/10/15     FL2 transmitted to all facilities in geographic area requested by pt/family on 08/10/15     FL2 transmitted to all facilities within larger geographic area on       Patient informed that his/her managed care company has contracts with or will negotiate with certain facilities, including the following:            Patient/family informed of bed offers received.  Patient chooses bed at       Physician recommends and patient chooses bed at      Patient to be transferred to   on  .  Patient to be transferred to facility by [facility])     Patient family notified on   of transfer.  Name of family member notified:        PHYSICIAN Please prepare priority discharge summary, including medications, Please sign FL2, Please prepare prescriptions     Additional  Comment:    _______________________________________________ Williemae Area, LCSW 08/10/2015 , 5:00 PM

## 2015-08-11 ENCOUNTER — Inpatient Hospital Stay (HOSPITAL_COMMUNITY): Payer: Medicare Other

## 2015-08-11 DIAGNOSIS — R110 Nausea: Secondary | ICD-10-CM

## 2015-08-11 MED ORDER — HYDRALAZINE HCL 20 MG/ML IJ SOLN: 10.0000 mg | INTRAMUSCULAR | Status: DC | PRN

## 2015-08-11 MED ORDER — FAMOTIDINE 20 MG PO TABS
20.0000 mg | ORAL_TABLET | Freq: Two times a day (BID) | ORAL | Status: DC
  Administered 2015-08-11 – 2015-08-12 (×2): 20 mg via ORAL
  Filled 2015-08-11 (×2): qty 1

## 2015-08-11 MED ORDER — BISACODYL 10 MG RE SUPP
10.0000 mg | Freq: Every day | RECTAL | Status: DC | PRN
Start: 2015-08-11 — End: 2015-08-12
  Administered 2015-08-11: 10 mg via RECTAL
  Filled 2015-08-11: qty 1

## 2015-08-11 MED ORDER — HYDRALAZINE HCL 10 MG PO TABS: 10.0000 mg | ORAL_TABLET | Freq: Three times a day (TID) | ORAL | Status: DC

## 2015-08-11 NOTE — Consult Note (Signed)
   [facility] CM Inpatient Consult   08/11/2015  [name] [birth year] [id number]  Patient screened for potential [facility] Management services. Patient is eligible for [place]. Electronic medical record reveals patient's discharge plan is for skilled nursing at a facility and  there were no identifiable [facility] care management needs at this time. [facility] Care Management services not appropriate at this time. If patient's post hospital needs change please place a [facility] Care Management consult. For questions please contact:   Natividad Brood, RN BSN [facility] Liaison  [phone] business mobile phone Toll free office [phone]

## 2015-08-11 NOTE — Progress Notes (Signed)
[facility] Progress Note  Patient: [name] [id number]   PCP: Gildardo Cranker, DO DOB: [birth year]   DOA: 08/07/2015   DOS: 08/11/2015   Date of Service: the patient was seen and examined on 08/11/2015  Subjective: Patient complains of nausea this morning. With minimal oral intake. No abdominal pain. No diarrhea no constipation. Urinary frequency improved with initiation of the medication Nutrition: Tolerating oral diet  Brief hospital course: Patient was admitted on 08/07/2015, with complaint of infusion and weakness of the left side, was found to have recrudescence of her prior CVA without any new CVA. Currently further plan is monitoring for improvement in her encephalopathy.  Assessment and Plan: 1. Left-sided weakness Most likely due to recrudescence from patient's prior CVA due to UTI. MRI brain negative for any acute stroke. Speech therapy recommended regular oral diet. PTOT recommends SNF. Social worker consulted. Continue Apixaban and as well as statin.  2. UTI. Nausea Continue with IV ceftriaxone at present due to persistent nausea Pepcid added for reflux X-ray abdomen does not show any acute abnormality.  3. Urinary retention. Patient is unable to tolerate Flomax due to orthostatic hypertension. And without oxybutynin as well as Myrbetriq the patient has significant urinary frequency. Discussed with patient's as well as family and decided to go with Myrbetriq at Present to reduce the urinary frequency and monitor for 1 week. Patient will need urology follow-up as an outpatient.   4. Orthostatic hypertension. At present resolved. Likely due to Flomax.  5.Atrial flutter. Occasional bradycardia on telemetry. Telemetry shows evidence of occasional bradycardia without any evidence of significant pause. This is likely secondary to patient's atrial flutter.  patient is on chronic anticoagulation for the same. Avoid medication for rate control.  Pain management:  When necessary Tylenol Activity: physical therapy recommends SNF Bowel regimen: last BM 08/09/2015 Diet: Regular diet DVT Prophylaxis: Therapeutic anticoagulation  Advance goals of care discussion: Partial code  Family Communication: family was present at bedside, at the time of interview. The pt provided permission to discuss medical plan with the family. Opportunity was given to ask question and all questions were answered satisfactorily.   Disposition:  Discharge to SNF versus home with home health Expected discharge date: 08/12/2015  Consultants: Neurology Procedures: Echocardiogram  Antibiotics: Anti-infectives    Start     Dose/Rate Route Frequency Ordered Stop   08/08/15 0900  cefTRIAXone (ROCEPHIN) 1 g in dextrose 5 % 50 mL IVPB     1 g 100 mL/hr over 30 Minutes Intravenous Daily 08/08/15 0745     08/07/15 0315  cefTRIAXone (ROCEPHIN) 1 g in dextrose 5 % 50 mL IVPB     1 g 100 mL/hr over 30 Minutes Intravenous  Once 08/07/15 0306 08/07/15 0403       Intake/Output Summary (Last 24 hours) at 08/11/15 2003 Last data filed at 08/11/15 0818  Gross per 24 hour  Intake    410 ml  Output      0 ml  Net    410 ml   Objective: Physical Exam: Filed Vitals:   08/11/15 0606 08/11/15 1007 08/11/15 1405 08/11/15 1743  BP: 165/73 154/73 168/88 155/67  Pulse: 60 63 74 81  Temp: 98.4 F (36.9 C) 98.4 F (36.9 C) 98.6 F (37 C) 98.6 F (37 C)  TempSrc: Oral Oral Oral Oral  Resp: 20 17 18 19   Height:      SpO2: 95% 98% 95% 97%   General: Alert, Awake and Oriented to Time, Place and Person. Appear  in Moderate distress Eyes: PERRL, Conjunctiva normal ENT: Oral Mucosa clear moist. Neck: no JVD, no Abnormal Mass Or lumps Cardiovascular: S1 and S2 Present, no Murmur, Respiratory: Bilateral Air entry equal and Decreased,  Clear to Auscultation, no Crackles, no wheezes Abdomen: Bowel Sound present, Soft and no tenderness Skin: redness no, no Rash  Extremities: no Pedal  edema, no calf tenderness Neurologic: Grossly no focal neuro deficit. Left-sided weaknessAppears stable  Data Reviewed: CBC:  Recent Labs Lab 08/07/15 0135 08/07/15 0203 08/07/15 0551 08/08/15 0539 08/09/15 0633  WBC 5.0  --  4.3 5.3 4.9  NEUTROABS 2.6  --   --  3.0 3.1  HGB 12.7 14.3 11.8* 13.0 12.4  HCT 39.0 42.0 36.3 40.1 38.4  MCV 95.8  --  95.0 96.2 93.7  PLT 177  --  155 172 [id number]   Basic Metabolic Panel:  Recent Labs Lab 08/07/15 0551 08/08/15 0539 08/08/15 1303 08/09/15 0633 08/10/15 0530  NA 144 148* 143 144 145  K 3.9 4.0 3.9 4.1 3.9  CL 108 108 107 109 111  CO2 26 29 26 25 24   GLUCOSE 101* 95 136* 102* 92  BUN 15 9 8 10 7   CREATININE 0.80 0.74 0.76 0.76 0.65  CALCIUM 9.1 8.7* 8.4* 8.3* 7.9*  MG 2.0 2.1  --  2.0  --     Liver Function Tests:  Recent Labs Lab 08/07/15 0135 08/08/15 0539  AST 24 23  ALT 11* 12*  ALKPHOS 52 43  BILITOT 0.5 0.6  PROT 6.1* 6.0*  ALBUMIN 3.5 3.3*   No results for input(s): LIPASE, AMYLASE in the last 168 hours.  Recent Labs Lab 08/08/15 1303  AMMONIA 16   Coagulation Profile:  Recent Labs Lab 08/07/15 0135  INR 1.13   Cardiac Enzymes: No results for input(s): CKTOTAL, CKMB, CKMBINDEX, TROPONINI in the last 168 hours. BNP (last 3 results) No results for input(s): PROBNP in the last 8760 hours.  CBG:  Recent Labs Lab 08/07/15 0202  GLUCAP 102*    Studies: Dg Abd Portable 1v  08/11/2015  CLINICAL DATA:  Possible constipation as the patient last had a bowel movement 3 days ago. Nausea and vomiting yesterday. EXAM: PORTABLE ABDOMEN - 1 VIEW COMPARISON:  CT abdomen and pelvis 12/18/2014 and earlier. Abdomen x-ray 12/18/2014. FINDINGS: Bowel gas pattern unremarkable without evidence of obstruction or significant ileus. Expected stool burden in the colon. No visible opaque urinary tract calculi. Degenerative changes involving the lower thoracic and lumbar spine. IMPRESSION: No acute abdominal abnormality.   Expected colonic stool burden. Electronically Signed   By: Evangeline Dakin M.D.   On: 08/11/2015 11:17     Scheduled Meds: . apixaban  2.5 mg Oral BID  . atorvastatin  10 mg Oral q1800  . cefTRIAXone (ROCEPHIN)  IV  1 g Intravenous Daily  . divalproex  250 mg Oral BID  . famotidine  20 mg Oral BID  . mirabegron ER  25 mg Oral Daily   Continuous Infusions:   PRN Meds: acetaminophen **OR** acetaminophen, bisacodyl, diphenhydrAMINE, hydrALAZINE, hydrocortisone, ondansetron (ZOFRAN) IV, senna-docusate  Time spent: 30 minutes  Author: Berle Mull, MD [facility] Pager: [phone] 08/11/2015 8:03 PM  If 7PM-7AM, please contact night-coverage at [email or web address], password [facility]

## 2015-08-11 NOTE — Clinical Social Work Note (Signed)
CSW presented bed offers to patient and her family they have chosen [facility].  CSW informed [facility] and they can offer patient a room on Tuesday.  CSW to continue to follow patient's progress throughout discharge planning.  Jones Broom. [place], MSW, [place] 08/11/2015 2:27 PM

## 2015-08-11 NOTE — Progress Notes (Signed)
Physical Therapy Treatment Patient Details Name: [name] MRN: [id number] DOB: 1924/11/26 Today's Date: 08/11/2015    History of Present Illness 82 y.o. female history of atrial fibrillation on Eliquis, hypertension, previous cerebral stroke with left-sided residual weakness and hyperlipidemia, brought to the emergency room and code stroke status following the acute onset of worsening of left-sided weakness as well as slurred speech.  MRI negative for acute process, old R lacunar basal ganglia and L parieto occipital lobe infarcts seen.    PT Comments    Patient not progressing due to limited by nausea.  Still feel she is a good candidate for SNF level rehab.  Will continue to follow acutely.  Follow Up Recommendations  SNF     Equipment Recommendations  None recommended by PT    Recommendations for Other Services       Precautions / Restrictions Precautions Precautions: Fall Precaution Comments: watch BP    Mobility  Bed Mobility Overal bed mobility: Needs Assistance Bed Mobility: Supine to Sit     Supine to sit: Min guard Sit to supine: Min assist   General bed mobility comments: increased time with rail to come up to sit; to supine increased time, assist for L LE and cues for positioning in bed  Transfers Overall transfer level: Needs assistance Equipment used: Rolling walker (2 wheeled) Transfers: Sit to/from Stand Sit to Stand: Min assist         General transfer comment: sat EOB several moments due to woozy; standing at bedside pt c/o nausea so returned to sit; then stood again for side step  Ambulation/Gait Ambulation/Gait assistance: Min assist Ambulation Distance (Feet): 3 Feet Assistive device: Rolling walker (2 wheeled)       General Gait Details: side steps up to [facility] with cues and assist   Stairs            Wheelchair Mobility    Modified Rankin (Stroke Patients Only)       Balance Overall balance assessment: Needs  assistance Sitting-balance support: Feet supported Sitting balance-Leahy Scale: Fair Sitting balance - Comments: can sit unsupported but with feeling poorly leans to R   Standing balance support: Bilateral upper extremity supported Standing balance-Leahy Scale: Poor Standing balance comment: UE support and minguard for balance                    Cognition Arousal/Alertness: Awake/alert Behavior During Therapy: WFL for tasks assessed/performed Overall Cognitive Status: History of cognitive impairments - at baseline                      Exercises General Exercises - Lower Extremity Long Arc Quad: Strengthening;Both;10 reps;Seated Hip Flexion/Marching: Both;10 reps;Seated;Strengthening Toe Raises: Strengthening;Both;10 reps;Seated Heel Raises: Strengthening;Both;10 reps;Seated    General Comments General comments (skin integrity, edema, etc.): Feeling poorly in sitting and standing; daughters report she vomited yesterday and passed out Saturday; report plans for transfer to rehab tomorrow      Pertinent Vitals/Pain Pain Assessment: No/denies pain    Home Living                      Prior Function            PT Goals (current goals can now be found in the care plan section) Progress towards PT goals: Not progressing toward goals - comment (due to nausea)    Frequency  Min 3X/week    PT Plan Current plan remains appropriate  Co-evaluation             End of Session Equipment Utilized During Treatment: Gait belt Activity Tolerance: Other (comment) (limited by nausea) Patient left: in bed;with bed alarm set;with family/visitor present;with call bell/phone within reach     Time: [id number] PT Time Calculation (min) (ACUTE ONLY): 21 min  Charges:  $Therapeutic Activity: 8-22 mins                    G Codes:      Reginia Naas 09/05/15, 4:58 PM  Magda Kiel, [place] 09/05/15

## 2015-08-11 NOTE — Progress Notes (Signed)
Patient having  Frequent periods of bradycardia  Rate 35- 40"s  Asymptomatic. Provider made aware  12 lead EKG ordered.

## 2015-08-11 NOTE — Care Management Note (Signed)
Case Management Note  Patient Details  Name: [name] MRN: [id number] Date of Birth: [birth year]  Subjective/Objective:                    Action/Plan: Plan is for SNF at discharge. CM following for discharge needs.   Expected Discharge Date:                  Expected Discharge Plan:     In-House Referral:     Discharge planning Services     Post Acute Care Choice:    Choice offered to:     DME Arranged:    DME Agency:     HH Arranged:    HH Agency:     Status of Service:  In process, will continue to follow  Medicare Important Message Given:    Date Medicare IM Given:    Medicare IM give by:    Date Additional Medicare IM Given:    Additional Medicare Important Message give by:     If discussed at [place] of Stay Meetings, dates discussed:    Additional Comments:  Pollie Friar, RN 08/11/2015, 2:07 PM

## 2015-08-12 DIAGNOSIS — F419 Anxiety disorder, unspecified: Secondary | ICD-10-CM | POA: Diagnosis not present

## 2015-08-12 DIAGNOSIS — F0150 Vascular dementia without behavioral disturbance: Secondary | ICD-10-CM | POA: Diagnosis not present

## 2015-08-12 DIAGNOSIS — M6281 Muscle weakness (generalized): Secondary | ICD-10-CM | POA: Diagnosis not present

## 2015-08-12 DIAGNOSIS — G9340 Encephalopathy, unspecified: Secondary | ICD-10-CM | POA: Diagnosis not present

## 2015-08-12 DIAGNOSIS — K5901 Slow transit constipation: Secondary | ICD-10-CM | POA: Diagnosis not present

## 2015-08-12 DIAGNOSIS — Z79899 Other long term (current) drug therapy: Secondary | ICD-10-CM | POA: Diagnosis not present

## 2015-08-12 DIAGNOSIS — E785 Hyperlipidemia, unspecified: Secondary | ICD-10-CM

## 2015-08-12 DIAGNOSIS — Z5189 Encounter for other specified aftercare: Secondary | ICD-10-CM | POA: Diagnosis not present

## 2015-08-12 DIAGNOSIS — G43809 Other migraine, not intractable, without status migrainosus: Secondary | ICD-10-CM | POA: Diagnosis not present

## 2015-08-12 DIAGNOSIS — G4700 Insomnia, unspecified: Secondary | ICD-10-CM | POA: Diagnosis not present

## 2015-08-12 DIAGNOSIS — K219 Gastro-esophageal reflux disease without esophagitis: Secondary | ICD-10-CM | POA: Diagnosis not present

## 2015-08-12 DIAGNOSIS — I10 Essential (primary) hypertension: Secondary | ICD-10-CM | POA: Diagnosis not present

## 2015-08-12 DIAGNOSIS — N3281 Overactive bladder: Secondary | ICD-10-CM | POA: Diagnosis not present

## 2015-08-12 DIAGNOSIS — I69354 Hemiplegia and hemiparesis following cerebral infarction affecting left non-dominant side: Secondary | ICD-10-CM | POA: Diagnosis not present

## 2015-08-12 DIAGNOSIS — N3000 Acute cystitis without hematuria: Secondary | ICD-10-CM | POA: Diagnosis not present

## 2015-08-12 DIAGNOSIS — I482 Chronic atrial fibrillation: Secondary | ICD-10-CM | POA: Diagnosis not present

## 2015-08-12 DIAGNOSIS — E46 Unspecified protein-calorie malnutrition: Secondary | ICD-10-CM | POA: Diagnosis not present

## 2015-08-12 DIAGNOSIS — D649 Anemia, unspecified: Secondary | ICD-10-CM | POA: Diagnosis not present

## 2015-08-12 DIAGNOSIS — M6289 Other specified disorders of muscle: Secondary | ICD-10-CM | POA: Diagnosis not present

## 2015-08-12 DIAGNOSIS — R2681 Unsteadiness on feet: Secondary | ICD-10-CM | POA: Diagnosis not present

## 2015-08-12 DIAGNOSIS — Z8673 Personal history of transient ischemic attack (TIA), and cerebral infarction without residual deficits: Secondary | ICD-10-CM | POA: Diagnosis not present

## 2015-08-12 DIAGNOSIS — E559 Vitamin D deficiency, unspecified: Secondary | ICD-10-CM | POA: Diagnosis not present

## 2015-08-12 DIAGNOSIS — R488 Other symbolic dysfunctions: Secondary | ICD-10-CM | POA: Diagnosis not present

## 2015-08-12 DIAGNOSIS — R1310 Dysphagia, unspecified: Secondary | ICD-10-CM | POA: Diagnosis not present

## 2015-08-12 DIAGNOSIS — I4892 Unspecified atrial flutter: Secondary | ICD-10-CM | POA: Diagnosis not present

## 2015-08-12 DIAGNOSIS — R531 Weakness: Secondary | ICD-10-CM | POA: Diagnosis not present

## 2015-08-12 LAB — BASIC METABOLIC PANEL
Anion gap: 10 (ref 5–15)
BUN: 8 mg/dL (ref 6–20)
CALCIUM: 7.9 mg/dL — AB (ref 8.9–10.3)
CHLORIDE: 108 mmol/L (ref 101–111)
CO2: 24 mmol/L (ref 22–32)
CREATININE: 0.73 mg/dL (ref 0.44–1.00)
GFR calc Af Amer: >60 mL/min (ref >60)
GFR calc non Af Amer: >60 mL/min (ref >60)
Glucose, Bld: 89 mg/dL (ref 65–99)
Potassium: 4.2 mmol/L (ref 3.5–5.1)
SODIUM: 142 mmol/L (ref 135–145)

## 2015-08-12 LAB — CBC
HCT: 38.9 % (ref 36.0–46.0)
Hemoglobin: 12.5 g/dL (ref 12.0–15.0)
MCH: 30.3 pg (ref 26.0–34.0)
MCHC: 32.1 g/dL (ref 30.0–36.0)
MCV: 94.2 fL (ref 78.0–100.0)
PLATELETS: 143 10*3/uL — AB (ref 150–400)
RBC: 4.13 MIL/uL (ref 3.87–5.11)
RDW: 14.2 % (ref 11.5–15.5)
WBC: 5.1 10*3/uL (ref 4.0–10.5)

## 2015-08-12 MED ORDER — ACETAMINOPHEN 325 MG PO TABS: 650.0000 mg | ORAL_TABLET | ORAL | Status: DC | PRN

## 2015-08-12 MED ORDER — SENNOSIDES-DOCUSATE SODIUM 8.6-50 MG PO TABS: 1.0000 | ORAL_TABLET | Freq: Every evening | ORAL | Status: DC | PRN

## 2015-08-12 MED ORDER — FAMOTIDINE 20 MG PO TABS: 20.0000 mg | ORAL_TABLET | Freq: Two times a day (BID) | ORAL | Status: DC

## 2015-08-12 NOTE — Clinical Documentation Improvement (Signed)
Internal Medicine   Please clarify the type of Encephalopathy you are monitoring. Please document response in next progress note. Thank you.    Metabolic Encephalopathy possibly secondary to UTI, Dementia, Mild Cognitive Loss per Neurology?  Other Type of Encephalopathy  Clinically Undetermined  Supporting Information:  Please exercise your independent, professional judgment when responding. A specific answer is not anticipated or expected.  Thank You, Zoila Shutter RN, BSN, [place] [phone]; Cell: [phone]

## 2015-08-12 NOTE — Clinical Social Work Note (Signed)
Currently awaiting completion of admissions paperwork for [facility].   Clinical Social Worker facilitated patient discharge including contacting patient family and facility to confirm patient discharge plans.  Clinical information faxed to facility and family agreeable with plan.  CSW arranged ambulance transport via [facility] to [facility].  RN to call report prior to discharge.  Clinical Social Worker will sign off for now as social work intervention is no longer needed. Please consult [place] again if new need arises.  Glendon Axe, MSW, LCSWA [phone] 08/12/2015 12:12 PM

## 2015-08-12 NOTE — Progress Notes (Signed)
Patient is till waiting for [facility] for pick up at this time, family appears upset and wanting to transport patient to the facility by car from daughter. [facility] called to canceled pickup. RN spoke with Minna Merritts from [place] center.   Sundae Maners, RN.

## 2015-08-12 NOTE — Progress Notes (Signed)
Occupational Therapy Treatment Patient Details Name: [name] MRN: [id number] DOB: [birth year] Today's Date: 08/12/2015    History of present illness 82 y.o. female history of atrial fibrillation on Eliquis, hypertension, previous cerebral stroke with left-sided residual weakness and hyperlipidemia, brought to the emergency room and code stroke status following the acute onset of worsening of left-sided weakness as well as slurred speech.  MRI negative for acute process, old R lacunar basal ganglia and L parieto occipital lobe infarcts seen.   OT comments  Pt sat EOB for approx 10 minutes wih encouragement  Follow Up Recommendations  SNF          Precautions / Restrictions Precautions Precautions: Fall       Mobility Bed Mobility Overal bed mobility: Needs Assistance Bed Mobility: Supine to Sit;Sit to Supine     Supine to sit: Min assist Sit to supine: Min assist      Transfers Overall transfer level: Needs assistance Equipment used: 1 person hand held assist Transfers: Sit to/from Stand Sit to Stand: Min assist                  ADL Overall ADL's : Needs assistance/impaired     Grooming: Minimal assistance;Sitting Grooming Details (indicate cue type and reason): pt able to sit EOB for approx 10 minutes. Pt needed max encouragment as well as VC to continueing.  Pt wanted to lie down but with encouragement pt did well                               General ADL Comments: daughters state pt is going to rehab prior to home                Cognition   Behavior During Therapy: [facility] for tasks assessed/performed Overall Cognitive Status: History of cognitive impairments - at baseline                                    Pertinent Vitals/ Pain       Pain Assessment: No/denies pain         Frequency Min 2X/week     Progress Toward Goals  OT Goals(current goals can now be found in the care plan section)  Progress towards OT  goals: Progressing toward goals     Plan Discharge plan remains appropriate       End of Session     Activity Tolerance Patient tolerated treatment well (nausea still there but maybe a little better)   Patient Left with call bell/phone within reach;in bed;with family/visitor present;with bed alarm set   Nurse Communication Mobility status        Time: 1003-1020 OT Time Calculation (min): 17 min  Charges: OT General Charges $OT Visit: 1 Procedure OT Treatments $Self Care/Home Management : 8-22 mins  Lovett Coffin D 08/12/2015, 10:30 AM

## 2015-08-12 NOTE — Clinical Social Work Placement (Signed)
   CLINICAL SOCIAL WORK PLACEMENT  NOTE  Date:  08/12/2015  Patient Details  Name: [name] MRN: [id number] Date of Birth: 17-Jul-1924  Clinical Social Work is seeking post-discharge placement for this patient at the [place] level of care (*CSW will initial, date and re-position this form in  chart as items are completed):  Yes   Patient/family provided with [facility] Work Department's list of facilities offering this level of care within the geographic area requested by the patient (or if unable, by the patient's family).  Yes   Patient/family informed of their freedom to choose among providers that offer the needed level of care, that participate in Medicare, Medicaid or managed care program needed by the patient, have an available bed and are willing to accept the patient.      Patient/family informed of [facility]'s ownership interest in [facility] and [facility], as well as of the fact that they are under no obligation to receive care at these facilities.  PASRR submitted to EDS on       PASRR number received on       Existing PASRR number confirmed on 08/10/15     FL2 transmitted to all facilities in geographic area requested by pt/family on 08/10/15     FL2 transmitted to all facilities within larger geographic area on       Patient informed that his/her managed care company has contracts with or will negotiate with certain facilities, including the following:        Yes   Patient/family informed of bed offers received.  Patient chooses bed at  [facility] [place])     Physician recommends and patient chooses bed at      Patient to be transferred to  [facility] [place]) on 08/12/15.  Patient to be transferred to facility by  [facility] )     Patient family notified on 08/12/15 of transfer.  Name of family member notified:   (Pt's dtr, [name] )     PHYSICIAN Please prepare priority discharge summary,  including medications, Please sign FL2, Please prepare prescriptions     Additional Comment:    _______________________________________________ Rozell Searing, LCSW 08/12/2015, 12:11 PM

## 2015-08-12 NOTE — Discharge Summary (Signed)
[facility] Discharge Summary   Patient: [name] [id number]   PCP: Gildardo Cranker, DO DOB: 1924-05-31   Date of admission: 08/07/2015   Date of discharge:  08/12/2015    Discharge Diagnoses:  Principal Problem:   Left-sided weakness Active Problems:   Atrial flutter ([place])   HTN (hypertension)   Atrial fibrillation ([place])   Acute cystitis without hematuria   HLD (hyperlipidemia)   Acute encephalopathy  Recommendations for Outpatient Follow-up:  1. Please follow up with PCP in 1 week urology in 2 week   Follow-up Information    Follow up with SETHI,PRAMOD, MD. Schedule an appointment as soon as possible for a visit in 2 months.   Specialties:  Neurology, Radiology   Why:  stroke clinic   Contact information:   [address] [place] [address] [phone]       Follow up with Gildardo Cranker, DO. Schedule an appointment as soon as possible for a visit in 1 week.   Specialty:  Internal Medicine   Contact information:   [place] [id number] [phone]       Schedule an appointment as soon as possible for a visit in 2 weeks to follow up.   Why:  urology for urge incontinence     Diet recommendation: regular diet  Activity: The patient is advised to gradually reintroduce usual activities.  Discharge Condition: good  History of present illness: As per the H and P dictated on admission, "[name] is a 82 y.o. female with a past medical history significant for HTN, A. fib on Eliquis, migraines on Depakote, CVA with residual left-sided weakness, mild dementia who presents with slurred speech and acute severe left-sided weakness.  The patient was completely normal state of health until around 12:30 tonight shortly after midnight, when she got up to go to the bathroom. She lives with her daughters, who assist her with ambulation. They were able to get her to standing, and she started to take a few steps, and then they felt that  she was collapsing, and they lowered her to the ground. At that time she stated that she had what seemed like slurred speech, and answered every question with "I can't move". They called 911, vomiting patient with left leg weakness and slurred speech and brought her to the ER.  In the ED, her slurred speech had resolved, and she had mild left-sided weakness,? Increased from baseline LLE weakness.m Afebrile, hemodynamically stable, hypertensive, SpO2 normal. Na 145, K 4.3, Cr 1.0, LFT normal, WBC 5K, Hgb 12, coags normal, TNI normal, CT head unremarkable, ECG no changes. CODE STROKE had been initiated in the field, but patient was not a tPA candidate due to rapidly improving symptoms. Neurology was involved and recommended observation and PT/OT eval, MR brain, and so [facility] were approached for admission."  Hospital Course:  Summary of her active problems in the hospital is as following. 1. Left-sided weakness Most likely due to recrudescence from patient's prior CVA due to UTI. MRI brain negative for any acute stroke. Speech therapy recommended regular oral diet. PTOT recommends SNF. Social worker consulted. Continue Apixaban. Pt can not tolerate statin and refusing.  2. UTI. Nausea Completed IV ceftriaxone at present due to persistent nausea. Pepcid added for reflux X-ray abdomen does not show any acute abnormality. H/o nausea with Lipitor in the past, therefor it has been stopped.  3. Urinary retention. Urge Incontinence. Patient is unable to tolerate Flomax due to orthostatic hypertension.  And without oxybutynin as well as Myrbetriq the patient has significant urinary frequency. Discussed with patient's as well as family and decided to go with Myrbetriq at Present to reduce the urinary frequency and monitor for 1 week. Patient will need urology follow-up as an outpatient.   4. Orthostatic hypertension. At present resolved. Likely due to Flomax.  5.Atrial flutter. Atrial  fibrillation CHA2DS2-VASc Score 6 Patient is currently in atrial flutter at controlled rate. Occasional bradycardia on telemetry, without any evidence of significant pause. This is likely secondary to patient's atrial flutter.  patient is on chronic anticoagulation for the same. Avoid medication for rate control.   All other chronic medical condition were stable during the hospitalization.  Patient was seen by physical therapy, who recommended SNF, which was arranged by Education officer, museum and case Freight forwarder. On the day of the discharge the patient's vitals were stable, and no other acute medical condition were reported by patient. the patient was felt safe to be discharge at [facility].  Procedures and Results:  Echocardiogram  Study Conclusions  - Left ventricle: The cavity size was normal. Wall thickness was  increased in a pattern of moderate LVH. There was mild concentric  hypertrophy. Systolic function was normal. The estimated ejection  fraction was in the range of 55% to 60%. Wall motion was normal;  there were no regional wall motion abnormalities. - Mitral valve: There was moderate regurgitation. - Left atrium: The atrium was moderately to severely dilated. - Tricuspid valve: There was moderate regurgitation. - Pulmonary arteries: Systolic pressure was mildly increased. PA  peak pressure: 42 mm Hg (S). - Pericardium, extracardiac: A small pericardial effusion was  identified circumferential to the heart.  Impressions:  - No cardiac source of emboli was indentified. Except that patient  appears to be in chronic atrial flutter/fib that could be a  cardiac source.   Carotid doppler Summary: Bilateral: intimal wall thickening CCA. Mild clacific plaque origin ICA. 1-39% ICA plaquing. Vertebral artery flow is antegrade.  Consultations:  NEUROLOGY   DISCHARGE MEDICATION: Current Discharge Medication List    START taking these medications   Details  famotidine (PEPCID)  20 MG tablet Take 1 tablet (20 mg total) by mouth 2 (two) times daily. Qty: 14 tablet, Refills: 0    senna-docusate (SENOKOT-S) 8.6-50 MG tablet Take 1 tablet by mouth at bedtime as needed for mild constipation. Qty: 30 tablet, Refills: 0      CONTINUE these medications which have CHANGED   Details  acetaminophen (TYLENOL) 325 MG tablet Take 2 tablets (650 mg total) by mouth every 4 (four) hours as needed for mild pain (or temp >/= 99.5 F).      CONTINUE these medications which have NOT CHANGED   Details  divalproex (DEPAKOTE) 125 MG DR tablet Take 2 tablets (250 mg total) by mouth 2 (two) times daily. Qty: 540 tablet, Refills: 1    ELIQUIS 2.5 MG TABS tablet TAKE 1 TABLET BY MOUTH TWICE DAILY Qty: 180 tablet, Refills: 1    hydrocortisone (ANUSOL-HC) 2.5 % rectal cream Place 1 application rectally as needed for hemorrhoids or itching.    hydrocortisone (ANUSOL-HC) 25 MG suppository Place 25 mg rectally as needed for hemorrhoids or itching.    mirabegron ER (MYRBETRIQ) 25 MG TB24 tablet Take 1 tablet (25 mg total) by mouth daily. Qty: 30 tablet, Refills: 3   Associated Diagnoses: OAB (overactive bladder)    olopatadine (PATANOL) 0.1 % ophthalmic solution Place 1 drop into both eyes 2 (two) times daily. Qty:  5 mL, Refills: 1    ondansetron (ZOFRAN) 4 MG tablet Take 1 tablet (4 mg total) by mouth every 6 (six) hours as needed for nausea. Qty: 10 tablet, Refills: 0    b complex vitamins tablet Take 1 tablet by mouth 2 (two) times daily.     Calcium Carb-Cholecalciferol (CALCIUM 600 + D) 600-200 MG-UNIT TABS Take 1 tablet by mouth 2 (two) times daily.    cetirizine (ZYRTEC) 10 MG tablet Take 10 mg by mouth at bedtime.    Cholecalciferol (VITAMIN D) 2000 UNITS tablet Take 2,000 Units by mouth daily.    diphenhydramine-acetaminophen (TYLENOL PM) 25-500 MG TABS Take 2 tablets by mouth at bedtime as needed (sleep).     Docusate Sodium (COLACE PO) Take 100 mg by mouth 2 (two) times  daily.     Melatonin 3 MG TABS Take 1 tablet by mouth at bedtime    Multiple Vitamin (MULTIVITAMIN WITH MINERALS) TABS Take 1 tablet by mouth daily.       Allergies  Allergen Reactions  . Aricept [Donepezil Hcl] Hypertension    With N/V  . Tramadol Hcl Other (See Comments)     lethargy, nausea  . Alendronate Sodium Rash and Other (See Comments)    puffy eyes   Discharge Instructions    Ambulatory referral to Neurology    Complete by:  As directed   Dr. Leonie Man requests follow up for this patient in 2 months.     Diet - low sodium heart healthy    Complete by:  As directed      Discharge instructions    Complete by:  As directed   It is important that you read following instructions as well as go over your medication list with RN to help you understand your care after this hospitalization.  Discharge Instructions: Please follow-up with PCP in one week Please follow up with urology.  Please request your primary care physician to go over all Hospital Tests and Procedure/Radiological results at the follow up,  Please get all Hospital records sent to your PCP by signing hospital release before you go home.   Do not take more than prescribed Pain, Sleep and Anxiety Medications. You were cared for by a hospitalist during your hospital stay. If you have any questions about your discharge medications or the care you received while you were in the hospital after you are discharged, you can call the unit and ask to speak with the hospitalist on call if the hospitalist that took care of you is not available.  Once you are discharged, your primary care physician will handle any further medical issues. Please note that NO REFILLS for any discharge medications will be authorized once you are discharged, as it is imperative that you return to your primary care physician (or establish a relationship with a primary care physician if you do not have one) for your aftercare needs so that they can  reassess your need for medications and monitor your lab values. You Must read complete instructions/literature along with all the possible adverse reactions/side effects for all the Medicines you take and that have been prescribed to you. Take any new Medicines after you have completely understood and accept all the possible adverse reactions/side effects. Wear Seat belts while driving.     Increase activity slowly    Complete by:  As directed           Discharge Exam: Filed Weights   08/11/15 2101  Weight: 60.1 kg (132 lb  7.9 oz)   Filed Vitals:   08/12/15 0512 08/12/15 1020  BP: 173/82 161/70  Pulse: 96 76  Temp: 98.6 F (37 C) 97.5 F (36.4 C)  Resp: 16 16   General: Appear in no distress, no Rash; Oral Mucosa moist. Cardiovascular: S1 and S2 Present, no Murmur, no JVD Respiratory: Bilateral Air entry present and Clear to Auscultation, no Crackles, no wheezes Abdomen: Bowel Sound present, Soft and no tenderness Extremities: no Pedal edema, no calf tenderness Neurology: Grossly no focal neuro deficit.  The results of significant diagnostics from this hospitalization (including imaging, microbiology, ancillary and laboratory) are listed below for reference.    Significant Diagnostic Studies: Dg Neck Soft Tissue  07/31/2015  CLINICAL DATA:  Choking this morning.  Pain with swelling. EXAM: NECK SOFT TISSUES - 1+ VIEW COMPARISON:  None. FINDINGS: Degenerative changes are seen in the cervical spine. The soft tissues are unremarkable. The upper lung apices are within normal limits as well. No pneumothorax. No abnormal air within the soft tissues. IMPRESSION: No acute abnormality. Electronically Signed   By: Dorise Bullion III M.D   On: 07/31/2015 12:19   Dg Chest 2 View  07/31/2015  CLINICAL DATA:  Choking episode this morning eating grits, spasm, pain with swallowing EXAM: CHEST  2 VIEW COMPARISON:  Three/ 27/17 FINDINGS: Cardiomediastinal silhouette is stable. No acute infiltrate or  pleural effusion. No pulmonary edema. Stable left basilar scarring. IMPRESSION: No active cardiopulmonary disease. Electronically Signed   By: Lahoma Crocker M.D.   On: 07/31/2015 12:29   Ct Head Wo Contrast  08/07/2015  CLINICAL DATA:  82 year old female with acute onset left-sided weakness and slurred speech. Code stroke. EXAM: CT HEAD WITHOUT CONTRAST TECHNIQUE: Contiguous axial images were obtained from the base of the skull through the vertex without intravenous contrast. COMPARISON:  CT dated 06/23/2015 FINDINGS: The ventricles are dilated and the sulci are prominent compatible with age-related atrophy. Periventricular and deep white matter hypodensities represent chronic microvascular ischemic changes. There is a focal area old infarct and encephalomalacia in the left occipital lobe. There is no intracranial hemorrhage. No mass effect or midline shift identified. The visualized paranasal sinuses and mastoid air cells are well aerated. The calvarium is intact. IMPRESSION: No acute intracranial hemorrhage. Age-related atrophy and chronic microvascular ischemic disease. Left occipital old infarct and encephalomalacia. If symptoms persist and there are no contraindications, MRI may provide better evaluation if clinically indicated. These results were called by telephone at the time of interpretation on 08/07/2015 at 2:04 am to Dr. Nicole Kindred, who verbally acknowledged these results. Electronically Signed   By: Anner Crete M.D.   On: 08/07/2015 02:05   Mr [name] Head Wo Contrast  08/08/2015  CLINICAL DATA:  Slurred speech and severe LEFT-sided weakness. History of stroke and mild LEFT-sided weakness, mild dementia, hypertension, migraines, atrial fibrillation on Eliquis. EXAM: MRI HEAD WITHOUT CONTRAST MRA HEAD WITHOUT CONTRAST TECHNIQUE: Multiplanar, multiecho pulse sequences of the brain and surrounding structures were obtained without intravenous contrast. Angiographic images of the head were obtained using MRA  technique without contrast. COMPARISON:  CT HEAD Aug 07, 2015 at 0150 hours and MRI/MRA head December 18, 2014 FINDINGS: MRI HEAD FINDINGS INTRACRANIAL CONTENTS: No reduced diffusion to suggest acute ischemia. No susceptibility artifact to suggest hemorrhage. Old cystic RIGHT basal ganglia lacunar infarct with ex vacuo dilatation RIGHT lateral ventricle. Moderate to severe ventriculomegaly on the basis of global parenchymal brain volume loss. Old small RIGHT cerebellar infarcts. LEFT parietal occipital encephalomalacia. Confluent supratentorial and pontine  white matter FLAIR T2 hyperintensities. No midline shift, mass effect or mass lesions. No abnormal extra-axial fluid collections. ORBITS: The included ocular globes and orbital contents are non-suspicious. Status post bilateral ocular lens implants. SINUSES: The mastoid air-cells and included paranasal sinuses are well-aerated. SKULL/SOFT TISSUES: No abnormal sellar expansion. No suspicious calvarial bone marrow signal. Generalized bright T1 calvarial bone marrow signal compatible with osteopenia. Craniocervical junction maintained. MRA HEAD FINDINGS (mildly motion degraded examination) Anterior circulation: Normal flow related enhancement of the included cervical, petrous, cavernous and supraclinoid internal carotid arteries. Patent anterior communicating artery. Similar tandem high-grade stenosis RIGHT greater than LEFT A2 and A3 segments. Moderate luminal irregularity mid to distal MCA branches. No large vessel occlusion, aneurysm. Posterior circulation: Codominant vertebral artery's. Basilar artery is patent, with normal flow related enhancement of the main branch vessels. Mild stenosis RIGHT P1-2 junction. Moderate stenosis LEFT distal P2 segment. No large vessel occlusion, aneurysm. IMPRESSION: MRI HEAD: No acute intracranial process, specifically no acute ischemia. Chronic changes including old RIGHT basal ganglia lacunar infarct and old LEFT  parieto-occipital lobe infarct (MCA versus watershed territory). Severe chronic small vessel ischemic disease. MRA HEAD: No emergent large vessel occlusion. Stable chronic changes including tandem high-grade stenosis anterior cerebral arteries, moderate luminal irregularity of the mid to distal MCA branches and moderate stenosis LEFT P2 segment. Electronically Signed   By: Elon Alas M.D.   On: 08/08/2015 00:31   Mr Brain Wo Contrast  08/08/2015  CLINICAL DATA:  Slurred speech and severe LEFT-sided weakness. History of stroke and mild LEFT-sided weakness, mild dementia, hypertension, migraines, atrial fibrillation on Eliquis. EXAM: MRI HEAD WITHOUT CONTRAST MRA HEAD WITHOUT CONTRAST TECHNIQUE: Multiplanar, multiecho pulse sequences of the brain and surrounding structures were obtained without intravenous contrast. Angiographic images of the head were obtained using MRA technique without contrast. COMPARISON:  CT HEAD Aug 07, 2015 at 0150 hours and MRI/MRA head December 18, 2014 FINDINGS: MRI HEAD FINDINGS INTRACRANIAL CONTENTS: No reduced diffusion to suggest acute ischemia. No susceptibility artifact to suggest hemorrhage. Old cystic RIGHT basal ganglia lacunar infarct with ex vacuo dilatation RIGHT lateral ventricle. Moderate to severe ventriculomegaly on the basis of global parenchymal brain volume loss. Old small RIGHT cerebellar infarcts. LEFT parietal occipital encephalomalacia. Confluent supratentorial and pontine white matter FLAIR T2 hyperintensities. No midline shift, mass effect or mass lesions. No abnormal extra-axial fluid collections. ORBITS: The included ocular globes and orbital contents are non-suspicious. Status post bilateral ocular lens implants. SINUSES: The mastoid air-cells and included paranasal sinuses are well-aerated. SKULL/SOFT TISSUES: No abnormal sellar expansion. No suspicious calvarial bone marrow signal. Generalized bright T1 calvarial bone marrow signal compatible with  osteopenia. Craniocervical junction maintained. MRA HEAD FINDINGS (mildly motion degraded examination) Anterior circulation: Normal flow related enhancement of the included cervical, petrous, cavernous and supraclinoid internal carotid arteries. Patent anterior communicating artery. Similar tandem high-grade stenosis RIGHT greater than LEFT A2 and A3 segments. Moderate luminal irregularity mid to distal MCA branches. No large vessel occlusion, aneurysm. Posterior circulation: Codominant vertebral artery's. Basilar artery is patent, with normal flow related enhancement of the main branch vessels. Mild stenosis RIGHT P1-2 junction. Moderate stenosis LEFT distal P2 segment. No large vessel occlusion, aneurysm. IMPRESSION: MRI HEAD: No acute intracranial process, specifically no acute ischemia. Chronic changes including old RIGHT basal ganglia lacunar infarct and old LEFT parieto-occipital lobe infarct (MCA versus watershed territory). Severe chronic small vessel ischemic disease. MRA HEAD: No emergent large vessel occlusion. Stable chronic changes including tandem high-grade stenosis anterior cerebral arteries, moderate luminal irregularity  of the mid to distal MCA branches and moderate stenosis LEFT P2 segment. Electronically Signed   By: Elon Alas M.D.   On: 08/08/2015 00:31   Dg Abd Portable 1v  08/11/2015  CLINICAL DATA:  Possible constipation as the patient last had a bowel movement 3 days ago. Nausea and vomiting yesterday. EXAM: PORTABLE ABDOMEN - 1 VIEW COMPARISON:  CT abdomen and pelvis 12/18/2014 and earlier. Abdomen x-ray 12/18/2014. FINDINGS: Bowel gas pattern unremarkable without evidence of obstruction or significant ileus. Expected stool burden in the colon. No visible opaque urinary tract calculi. Degenerative changes involving the lower thoracic and lumbar spine. IMPRESSION: No acute abdominal abnormality.  Expected colonic stool burden. Electronically Signed   By: Evangeline Dakin M.D.    On: 08/11/2015 11:17    Microbiology: Recent Results (from the past 240 hour(s))  Urine culture     Status: Abnormal   Collection Time: 08/07/15  2:24 AM  Result Value Ref Range Status   Specimen Description URINE, CLEAN CATCH  Final   Special Requests Normal  Final   Culture MULTIPLE SPECIES PRESENT, SUGGEST RECOLLECTION (A)  Final   Report Status 08/08/2015 FINAL  Final     Labs: CBC:  Recent Labs Lab 08/07/15 0135 08/07/15 0203 08/07/15 0551 08/08/15 0539 08/09/15 0633 08/12/15 0228  WBC 5.0  --  4.3 5.3 4.9 5.1  NEUTROABS 2.6  --   --  3.0 3.1  --   HGB 12.7 14.3 11.8* 13.0 12.4 12.5  HCT 39.0 42.0 36.3 40.1 38.4 38.9  MCV 95.8  --  95.0 96.2 93.7 94.2  PLT 177  --  155 172 164 [id number]*   Basic Metabolic Panel:  Recent Labs Lab 08/07/15 0551 08/08/15 0539 08/08/15 1303 08/09/15 0633 08/10/15 0530 08/12/15 0228  NA 144 148* 143 144 145 142  K 3.9 4.0 3.9 4.1 3.9 4.2  CL 108 108 107 109 111 108  CO2 26 29 26 25 24 24   GLUCOSE 101* 95 136* 102* 92 89  BUN 15 9 8 10 7 8   CREATININE 0.80 0.74 0.76 0.76 0.65 0.73  CALCIUM 9.1 8.7* 8.4* 8.3* 7.9* 7.9*  MG 2.0 2.1  --  2.0  --   --    Liver Function Tests:  Recent Labs Lab 08/07/15 0135 08/08/15 0539  AST 24 23  ALT 11* 12*  ALKPHOS 52 43  BILITOT 0.5 0.6  PROT 6.1* 6.0*  ALBUMIN 3.5 3.3*   No results for input(s): LIPASE, AMYLASE in the last 168 hours.  Recent Labs Lab 08/08/15 1303  AMMONIA 16   Cardiac Enzymes: No results for input(s): CKTOTAL, CKMB, CKMBINDEX, TROPONINI in the last 168 hours. BNP (last 3 results) No results for input(s): BNP in the last 8760 hours. CBG:  Recent Labs Lab 08/07/15 0202  GLUCAP 102*   Time spent: 30 minutes  Signed:  Brayten Komar  [facility]  08/12/2015  , 11:11 AM

## 2015-08-12 NOTE — Progress Notes (Signed)
Report given to Nurse Santiago Glad from [facility]. Family aware of disposition. Advise family to take all belongings home or to facility at this time. Awaiting on transport.   Ave Filter, RN

## 2015-08-12 NOTE — Care Management Important Message (Signed)
Important Message  Patient Details  Name: [name] MRN: [id number] Date of Birth: [birth year]   Medicare Important Message Given:  Yes    Rolm Baptise, RN 08/12/2015, 11:57 AM

## 2015-08-13 ENCOUNTER — Non-Acute Institutional Stay (SKILLED_NURSING_FACILITY): Payer: Medicare Other | Admitting: Adult Health

## 2015-08-13 ENCOUNTER — Encounter: Payer: Self-pay | Admitting: Adult Health

## 2015-08-13 DIAGNOSIS — G4700 Insomnia, unspecified: Secondary | ICD-10-CM

## 2015-08-13 DIAGNOSIS — N3281 Overactive bladder: Secondary | ICD-10-CM

## 2015-08-13 DIAGNOSIS — M6289 Other specified disorders of muscle: Secondary | ICD-10-CM

## 2015-08-13 DIAGNOSIS — G43809 Other migraine, not intractable, without status migrainosus: Secondary | ICD-10-CM

## 2015-08-13 DIAGNOSIS — Z8673 Personal history of transient ischemic attack (TIA), and cerebral infarction without residual deficits: Secondary | ICD-10-CM | POA: Diagnosis not present

## 2015-08-13 DIAGNOSIS — E559 Vitamin D deficiency, unspecified: Secondary | ICD-10-CM | POA: Diagnosis not present

## 2015-08-13 DIAGNOSIS — K5901 Slow transit constipation: Secondary | ICD-10-CM

## 2015-08-13 DIAGNOSIS — K219 Gastro-esophageal reflux disease without esophagitis: Secondary | ICD-10-CM

## 2015-08-13 DIAGNOSIS — E46 Unspecified protein-calorie malnutrition: Secondary | ICD-10-CM

## 2015-08-13 DIAGNOSIS — I4892 Unspecified atrial flutter: Secondary | ICD-10-CM

## 2015-08-13 DIAGNOSIS — I10 Essential (primary) hypertension: Secondary | ICD-10-CM | POA: Diagnosis not present

## 2015-08-13 DIAGNOSIS — R531 Weakness: Secondary | ICD-10-CM

## 2015-08-13 NOTE — Progress Notes (Signed)
Patient ID: [name], female   DOB: 22-Jun-1924, 82 y.o.   MRN: [id number]    DATE:  08/13/2015   MRN:  [id number]  BIRTHDAY: June 13, 1924  Facility:  [facility] Home Location:  [place] Room Number: 705-P  LEVEL OF CARE:  [phone])  Contact Information    Name Relation Home Work [place] Daughter [phone]  [phone]   Plummer,Kim Daughter [phone]   Petzold,Kit Daughter [phone]  [phone]       Code Status History    Date Active Date Inactive Code Status Order ID Comments User Context   08/07/2015  4:52 AM 08/12/2015  8:32 PM Partial Code [id number]  Edwin Dada, MD Inpatient   12/18/2014  7:17 PM 12/21/2014  2:11 PM Partial Code [id number]  Willia Craze, NP Inpatient   10/31/2014  3:32 AM 10/31/2014  7:24 PM Full Code [id number]  Theressa Millard, MD Inpatient   08/28/2013 12:58 AM 08/30/2013  4:40 PM Full Code [id number]  Kelvin Cellar, MD Inpatient   03/09/2013  6:50 PM 03/12/2013  6:27 PM Full Code [id number]  Verlee Monte, MD Inpatient   02/15/2013  6:26 PM 03/03/2013 12:40 PM Full Code [id number]  Cathlyn Parsons, PA-C Inpatient   02/12/2013 12:50 PM 02/15/2013  6:26 PM Full Code [id number]  Velvet Bathe, MD Inpatient   02/05/2013  7:35 PM 02/07/2013  8:48 PM Full Code [id number]  Kelvin Cellar, MD Inpatient   08/01/2012  5:49 PM 08/03/2012  2:28 PM Full Code [id number]  Melton Alar, PA-C Inpatient    Questions for Most Recent Historical Code Status (Order [id number])    Question Answer Comment   In the event of cardiac or respiratory ARREST: Initiate Code Blue, Call Rapid Response Yes    In the event of cardiac or respiratory ARREST: Perform CPR Yes    In the event of cardiac or respiratory ARREST: Perform Intubation/Mechanical Ventilation No    In the event of cardiac or respiratory ARREST: Use NIPPV/BiPAp only if indicated Yes    In the event of cardiac or respiratory ARREST: Administer  ACLS medications if indicated Yes    In the event of cardiac or respiratory ARREST: Perform Defibrillation or Cardioversion if indicated Yes        Chief Complaint  Patient presents with  . Hospitalization Follow-up    HISTORY OF PRESENT ILLNESS:  This is an 82 year old female who was been admitted to [facility] on 08/12/15 from [facility]. She has PMH of hypertension, atrial fibrillation on Eliquis, migraines on Depakote, CVA with residual left sided weakness and mild dementia. She had episode of left-sided weakness. CT head /MRI brain was unremarkable and no ECG changes. She was not given tPA due to rapidly improving symptoms. Left-sided weakness was thought to be due to prior CVA and UTI. She has completed IV ceftriaxone in the hospital. Daughters at bedside.  She has been admitted for a short-term rehabilitation.  PAST MEDICAL HISTORY:  Past Medical History  Diagnosis Date  . Arthritis   . Hypertension   . Migraine   . Colon polyp   . Hearing difficulty   . Torus palatinus   . Atrial flutter ([place]) January, 2012  . Stroke ([place]) 08/02/12     right lenticular nucleus infarct  . Left leg weakness 02/05/2013  . Chronic anticoagulation   . High cholesterol   . Atrial fibrillation ([place])     on Eliquis Rx  .  Mild cognitive impairment with memory loss   . Malnutrition ([facility])   . TIA (transient ischemic attack)   . Constipation   . Osteoporosis   . Hemorrhoids   . Left-sided weakness   . Acute encephalopathy   . HLD (hyperlipidemia)   . Acute cystitis without hematuria   . A-fib ([facility])      CURRENT MEDICATIONS: Reviewed  Patient's Medications  New Prescriptions   No medications on file  Previous Medications   ACETAMINOPHEN (TYLENOL) 325 MG TABLET    Take 2 tablets (650 mg total) by mouth every 4 (four) hours as needed for mild pain (or temp >/= 99.5 F).   B COMPLEX VITAMINS TABLET    Take 1 tablet by mouth 2 (two) times daily.    CALCIUM CARB-CHOLECALCIFEROL  (CALCIUM 600 + D) 600-200 MG-UNIT TABS    Take 1 tablet by mouth 2 (two) times daily.   CHOLECALCIFEROL (VITAMIN D) 2000 UNITS TABLET    Take 2,000 Units by mouth daily.   DIPHENHYDRAMINE-ACETAMINOPHEN (TYLENOL PM) 25-500 MG TABS    Take 2 tablets by mouth at bedtime as needed (sleep).    DIVALPROEX (DEPAKOTE) 125 MG DR TABLET    Take 2 tablets (250 mg total) by mouth 2 (two) times daily.   DOCUSATE SODIUM (COLACE PO)    Take 100 mg by mouth 2 (two) times daily.    ELIQUIS 2.5 MG TABS TABLET    TAKE 1 TABLET BY MOUTH TWICE DAILY   FAMOTIDINE (PEPCID) 20 MG TABLET    Take 1 tablet (20 mg total) by mouth 2 (two) times daily.   HYDROCORTISONE (ANUSOL-HC) 2.5 % RECTAL CREAM    Place 1 application rectally as needed for hemorrhoids or itching.   HYDROCORTISONE (ANUSOL-HC) 25 MG SUPPOSITORY    Place 25 mg rectally as needed for hemorrhoids or itching.   MELATONIN 3 MG TABS    Take 1 tablet by mouth at bedtime   MIRABEGRON ER (MYRBETRIQ) 25 MG TB24 TABLET    Take 1 tablet (25 mg total) by mouth daily.   MULTIPLE VITAMIN (MULTIVITAMIN WITH MINERALS) TABS    Take 1 tablet by mouth daily.   OLOPATADINE (PATANOL) 0.1 % OPHTHALMIC SOLUTION    Place 1 drop into both eyes 2 (two) times daily.   ONDANSETRON (ZOFRAN) 4 MG TABLET    Take 1 tablet (4 mg total) by mouth every 6 (six) hours as needed for nausea.   SENNA-DOCUSATE (SENOKOT-S) 8.6-50 MG TABLET    Take 1 tablet by mouth at bedtime as needed for mild constipation.  Modified Medications   No medications on file  Discontinued Medications   CETIRIZINE (ZYRTEC) 10 MG TABLET    Take 10 mg by mouth at bedtime.     Allergies  Allergen Reactions  . Aricept [Donepezil Hcl] Hypertension    With N/V  . Tramadol Hcl Other (See Comments)     lethargy, nausea  . Alendronate Sodium Rash and Other (See Comments)    puffy eyes     REVIEW OF SYSTEMS:  GENERAL: no change in appetite, no fatigue, no weight changes, no fever, chills or weakness EYES: Denies  change in vision, dry eyes, eye pain, itching or discharge EARS: Denies change in hearing, ringing in ears, or earache NOSE: Denies nasal congestion or epistaxis MOUTH and THROAT: Denies oral discomfort, gingival pain or bleeding, pain from teeth or hoarseness   RESPIRATORY: no cough, SOB, DOE, wheezing, hemoptysis CARDIAC: no chest pain, edema or palpitations GI: no abdominal   pain, diarrhea, constipation, heart burn, nausea or vomiting GU: Denies dysuria, frequency, hematuria, incontinence, or discharge PSYCHIATRIC: Denies feeling of depression or anxiety. No report of hallucinations, insomnia, paranoia, or agitation    PHYSICAL EXAMINATION  GENERAL APPEARANCE: Well nourished. In no acute distress. Normal body habitus SKIN:  Skin is warm and dry.  HEAD: Normal in size and contour. No evidence of trauma EYES: Lids open and close normally. No blepharitis, entropion or ectropion. PERRL. Conjunctivae are clear and sclerae are white. Lenses are without opacity EARS: Pinnae are normal. Patient hears normal voice tunes of the examiner MOUTH and THROAT: Lips are without lesions. Oral mucosa is moist and without lesions. Tongue is normal in shape, size, and color and without lesions NECK: supple, trachea midline, no neck masses, no thyroid tenderness, no thyromegaly LYMPHATICS: no LAN in the neck, no supraclavicular LAN RESPIRATORY: breathing is even & unlabored, BS CTAB CARDIAC: Irregularly irregular, no murmur,no extra heart sounds, no edema GI: abdomen soft, normal BS, no masses, no tenderness, no hepatomegaly, no splenomegaly EXTREMITIES:  Able to move 4 extremities, left-sided weakness PSYCHIATRIC: Alert to person, disoriented to time and place. Affect and behavior are appropriate  LABS/RADIOLOGY: Labs reviewed: Basic Metabolic Panel:  Recent Labs  08/07/15 0551 08/08/15 0539  08/09/15 0633 08/10/15 0530 08/12/15 0228  NA 144 148*  < > 144 145 142  K 3.9 4.0  < > 4.1 3.9 4.2   CL 108 108  < > 109 111 108  CO2 26 29  < > _0 GLUCOSE 101* 95  < > 102* 92 89  BUN 15 9  < > _1 CREATININE 0.80 0.74  < > 0.76 0.65 0.73  CALCIUM 9.1 8.7*  < > 8.3* 7.9* 7.9*  MG 2.0 2.1  --  2.0  --   --   < > = values in this interval not displayed. Liver Function Tests:  Recent Labs  06/23/15 1645 08/07/15 0135 08/08/15 0539  AST _2 ALT 10* 11* 12*  ALKPHOS 48 52 43  BILITOT 0.4 0.5 0.6  PROT 5.2* 6.1* 6.0*  ALBUMIN 3.1* 3.5 3.3*    Recent Labs  10/30/14 2300  LIPASE 44    Recent Labs  06/23/15 1643 08/08/15 1303  AMMONIA 21 16   CBC:  Recent Labs  08/07/15 0135  08/08/15 0539 08/09/15 0633 08/12/15 0228  WBC 5.0  < > 5.3 4.9 5.1  NEUTROABS 2.6  --  3.0 3.1  --   HGB 12.7  < > 13.0 12.4 12.5  HCT 39.0  < > 40.1 38.4 38.9  MCV 95.8  < > 96.2 93.7 94.2  PLT 177  < > 172 164 143*  < > = values in this interval not displayed.  Lipid Panel:  Recent Labs  12/19/14 0433 08/07/15 0551  HDL 47 47   Cardiac Enzymes:  Recent Labs  10/31/14 1510 01/01/15 1311 07/31/15 1115  TROPONINI <0.03 <0.03 <0.03   CBG:  Recent Labs  06/23/15 1715 08/07/15 0202  GLUCAP 79 102*      Dg Neck Soft Tissue  07/31/2015  CLINICAL DATA:  Choking this morning.  Pain with swelling. EXAM: NECK SOFT TISSUES - 1+ VIEW COMPARISON:  None. FINDINGS: Degenerative changes are seen in the cervical spine. The soft tissues are unremarkable. The upper lung apices are within normal limits as well. No pneumothorax. No abnormal air within the soft tissues. IMPRESSION: No acute abnormality. Electronically Signed  By: David  Williams III M.D   On: 07/31/2015 12:19   Dg Chest 2 View  07/31/2015  CLINICAL DATA:  Choking episode this morning eating grits, spasm, pain with swallowing EXAM: CHEST  2 VIEW COMPARISON:  Three/ 27/17 FINDINGS: Cardiomediastinal silhouette is stable. No acute infiltrate or pleural effusion. No pulmonary edema. Stable left basilar  scarring. IMPRESSION: No active cardiopulmonary disease. Electronically Signed   By: Liviu  Pop M.D.   On: 07/31/2015 12:29   Ct Head Wo Contrast  08/07/2015  CLINICAL DATA:  89-year-old female with acute onset left-sided weakness and slurred speech. Code stroke. EXAM: CT HEAD WITHOUT CONTRAST TECHNIQUE: Contiguous axial images were obtained from the base of the skull through the vertex without intravenous contrast. COMPARISON:  CT dated 06/23/2015 FINDINGS: The ventricles are dilated and the sulci are prominent compatible with age-related atrophy. Periventricular and deep white matter hypodensities represent chronic microvascular ischemic changes. There is a focal area old infarct and encephalomalacia in the left occipital lobe. There is no intracranial hemorrhage. No mass effect or midline shift identified. The visualized paranasal sinuses and mastoid air cells are well aerated. The calvarium is intact. IMPRESSION: No acute intracranial hemorrhage. Age-related atrophy and chronic microvascular ischemic disease. Left occipital old infarct and encephalomalacia. If symptoms persist and there are no contraindications, MRI may provide better evaluation if clinically indicated. These results were called by telephone at the time of interpretation on 08/07/2015 at 2:04 am to Dr. Stewart, who verbally acknowledged these results. Electronically Signed   By: Arash  Radparvar M.D.   On: 08/07/2015 02:05   Mr Mra Head Wo Contrast  08/08/2015  CLINICAL DATA:  Slurred speech and severe LEFT-sided weakness. History of stroke and mild LEFT-sided weakness, mild dementia, hypertension, migraines, atrial fibrillation on Eliquis. EXAM: MRI HEAD WITHOUT CONTRAST MRA HEAD WITHOUT CONTRAST TECHNIQUE: Multiplanar, multiecho pulse sequences of the brain and surrounding structures were obtained without intravenous contrast. Angiographic images of the head were obtained using MRA technique without contrast. COMPARISON:  CT HEAD Aug 07, 2015 at 0150 hours and MRI/MRA head December 18, 2014 FINDINGS: MRI HEAD FINDINGS INTRACRANIAL CONTENTS: No reduced diffusion to suggest acute ischemia. No susceptibility artifact to suggest hemorrhage. Old cystic RIGHT basal ganglia lacunar infarct with ex vacuo dilatation RIGHT lateral ventricle. Moderate to severe ventriculomegaly on the basis of global parenchymal brain volume loss. Old small RIGHT cerebellar infarcts. LEFT parietal occipital encephalomalacia. Confluent supratentorial and pontine white matter FLAIR T2 hyperintensities. No midline shift, mass effect or mass lesions. No abnormal extra-axial fluid collections. ORBITS: The included ocular globes and orbital contents are non-suspicious. Status post bilateral ocular lens implants. SINUSES: The mastoid air-cells and included paranasal sinuses are well-aerated. SKULL/SOFT TISSUES: No abnormal sellar expansion. No suspicious calvarial bone marrow signal. Generalized bright T1 calvarial bone marrow signal compatible with osteopenia. Craniocervical junction maintained. MRA HEAD FINDINGS (mildly motion degraded examination) Anterior circulation: Normal flow related enhancement of the included cervical, petrous, cavernous and supraclinoid internal carotid arteries. Patent anterior communicating artery. Similar tandem high-grade stenosis RIGHT greater than LEFT A2 and A3 segments. Moderate luminal irregularity mid to distal MCA branches. No large vessel occlusion, aneurysm. Posterior circulation: Codominant vertebral artery's. Basilar artery is patent, with normal flow related enhancement of the main branch vessels. Mild stenosis RIGHT P1-2 junction. Moderate stenosis LEFT distal P2 segment. No large vessel occlusion, aneurysm. IMPRESSION: MRI HEAD: No acute intracranial process, specifically no acute ischemia. Chronic changes including old RIGHT basal ganglia lacunar infarct and old LEFT parieto-occipital lobe infarct (  MCA versus watershed territory).  Severe chronic small vessel ischemic disease. MRA HEAD: No emergent large vessel occlusion. Stable chronic changes including tandem high-grade stenosis anterior cerebral arteries, moderate luminal irregularity of the mid to distal MCA branches and moderate stenosis LEFT P2 segment. Electronically Signed   By: Elon Alas M.D.   On: 08/08/2015 00:31   Mr Brain Wo Contrast  08/08/2015  CLINICAL DATA:  Slurred speech and severe LEFT-sided weakness. History of stroke and mild LEFT-sided weakness, mild dementia, hypertension, migraines, atrial fibrillation on Eliquis. EXAM: MRI HEAD WITHOUT CONTRAST MRA HEAD WITHOUT CONTRAST TECHNIQUE: Multiplanar, multiecho pulse sequences of the brain and surrounding structures were obtained without intravenous contrast. Angiographic images of the head were obtained using MRA technique without contrast. COMPARISON:  CT HEAD Aug 07, 2015 at 0150 hours and MRI/MRA head December 18, 2014 FINDINGS: MRI HEAD FINDINGS INTRACRANIAL CONTENTS: No reduced diffusion to suggest acute ischemia. No susceptibility artifact to suggest hemorrhage. Old cystic RIGHT basal ganglia lacunar infarct with ex vacuo dilatation RIGHT lateral ventricle. Moderate to severe ventriculomegaly on the basis of global parenchymal brain volume loss. Old small RIGHT cerebellar infarcts. LEFT parietal occipital encephalomalacia. Confluent supratentorial and pontine white matter FLAIR T2 hyperintensities. No midline shift, mass effect or mass lesions. No abnormal extra-axial fluid collections. ORBITS: The included ocular globes and orbital contents are non-suspicious. Status post bilateral ocular lens implants. SINUSES: The mastoid air-cells and included paranasal sinuses are well-aerated. SKULL/SOFT TISSUES: No abnormal sellar expansion. No suspicious calvarial bone marrow signal. Generalized bright T1 calvarial bone marrow signal compatible with osteopenia. Craniocervical junction maintained. MRA HEAD FINDINGS  (mildly motion degraded examination) Anterior circulation: Normal flow related enhancement of the included cervical, petrous, cavernous and supraclinoid internal carotid arteries. Patent anterior communicating artery. Similar tandem high-grade stenosis RIGHT greater than LEFT A2 and A3 segments. Moderate luminal irregularity mid to distal MCA branches. No large vessel occlusion, aneurysm. Posterior circulation: Codominant vertebral artery's. Basilar artery is patent, with normal flow related enhancement of the main branch vessels. Mild stenosis RIGHT P1-2 junction. Moderate stenosis LEFT distal P2 segment. No large vessel occlusion, aneurysm. IMPRESSION: MRI HEAD: No acute intracranial process, specifically no acute ischemia. Chronic changes including old RIGHT basal ganglia lacunar infarct and old LEFT parieto-occipital lobe infarct (MCA versus watershed territory). Severe chronic small vessel ischemic disease. MRA HEAD: No emergent large vessel occlusion. Stable chronic changes including tandem high-grade stenosis anterior cerebral arteries, moderate luminal irregularity of the mid to distal MCA branches and moderate stenosis LEFT P2 segment. Electronically Signed   By: Elon Alas M.D.   On: 08/08/2015 00:31   Dg Abd Portable 1v  08/11/2015  CLINICAL DATA:  Possible constipation as the patient last had a bowel movement 3 days ago. Nausea and vomiting yesterday. EXAM: PORTABLE ABDOMEN - 1 VIEW COMPARISON:  CT abdomen and pelvis 12/18/2014 and earlier. Abdomen x-ray 12/18/2014. FINDINGS: Bowel gas pattern unremarkable without evidence of obstruction or significant ileus. Expected stool burden in the colon. No visible opaque urinary tract calculi. Degenerative changes involving the lower thoracic and lumbar spine. IMPRESSION: No acute abdominal abnormality.  Expected colonic stool burden. Electronically Signed   By: Evangeline Dakin M.D.   On: 08/11/2015 11:17    ASSESSMENT/PLAN:  Left-sided weakness -  this was thought to be due to prior CVA and UTI; MRI brain negative for any acute stroke; for rehabilitation, speech therapy, PT and OT; follow-up with Dr. Leonie Man, neurology, in 2 months  Hx of CVA - continue Eliquis 2.5 mg 1  tab PO BID; check CBC  UTI - S/P Ceftriaxone IV; Resolved  OAB - continue Myrbetriq ER 25 mg 1 tab by mouth daily; check BMP   Constipation - continue senna S1 tab by mouth daily at bedtime when necessary and Colace 100 mg 1 capsule by mouth twice a day  Vitamin D deficiency - continue vitamin D 2 2000 units 1 tab by mouth daily  GERD - continue famotidine 20 mg 1 tab by mouth twice a day  Migraine - continue Depakote 125 mg take 2 tabs = none needed 50 mg by mouth twice a day  Atrial flutter - rate controlled; continue Eliquis  Insomnia - continue melatonin 3 mg 1 tab by mouth daily at bedtime  Protein calorie malnutrition - albumin 3.3; start Procel 1 scoop by mouth twice a day  Hypertension - BP 164/98; start amlodipine 2.5 mg 1 tab by mouth daily; check BP twice a day 1 week    Goals of care:  Short-term rehabilitation     Monina Medina-Vargas, NP [facility] [phone]  

## 2015-08-14 LAB — BASIC METABOLIC PANEL
BUN: 12 mg/dL (ref 4–21)
Creatinine: 0.8 mg/dL (ref 0.5–1.1)
GLUCOSE: 77 mg/dL
Potassium: 3.9 mmol/L (ref 3.4–5.3)
SODIUM: 145 mmol/L (ref 137–147)

## 2015-08-14 LAB — CBC AND DIFFERENTIAL
HCT: 40 % (ref 36–46)
Hemoglobin: 13 g/dL (ref 12.0–16.0)
NEUTROS ABS: 2 /uL
PLATELETS: 166 10*3/uL (ref 150–399)
WBC: 4 10^3/mL

## 2015-08-15 ENCOUNTER — Non-Acute Institutional Stay (SKILLED_NURSING_FACILITY): Payer: Medicare Other | Admitting: Internal Medicine

## 2015-08-15 ENCOUNTER — Encounter: Payer: Self-pay | Admitting: Internal Medicine

## 2015-08-15 DIAGNOSIS — I10 Essential (primary) hypertension: Secondary | ICD-10-CM | POA: Diagnosis not present

## 2015-08-15 DIAGNOSIS — R1310 Dysphagia, unspecified: Secondary | ICD-10-CM | POA: Diagnosis not present

## 2015-08-15 DIAGNOSIS — I482 Chronic atrial fibrillation, unspecified: Secondary | ICD-10-CM

## 2015-08-15 DIAGNOSIS — R531 Weakness: Secondary | ICD-10-CM

## 2015-08-15 DIAGNOSIS — M6289 Other specified disorders of muscle: Secondary | ICD-10-CM | POA: Diagnosis not present

## 2015-08-15 DIAGNOSIS — N3000 Acute cystitis without hematuria: Secondary | ICD-10-CM

## 2015-08-15 DIAGNOSIS — F0150 Vascular dementia without behavioral disturbance: Secondary | ICD-10-CM | POA: Diagnosis not present

## 2015-08-15 DIAGNOSIS — I69354 Hemiplegia and hemiparesis following cerebral infarction affecting left non-dominant side: Secondary | ICD-10-CM

## 2015-08-15 DIAGNOSIS — K5901 Slow transit constipation: Secondary | ICD-10-CM | POA: Diagnosis not present

## 2015-08-15 NOTE — Progress Notes (Signed)
Provider:  Rexene Edison. Mariea Clonts, D.O., C.M.D. Location:  [place] Room Number: 705-P Place of Service:  SNF (31)  PCP: Gildardo Cranker, DO Patient Care Team: Gildardo Cranker, DO as PCP - General (Internal Medicine)  Extended Emergency Contact Information Primary Emergency Contact: [facility] Address: [address]          [place], [place] [address] John[place]te Litter of [place] Phone: [phone] Mobile Phone: [phone] Relation: Daughter Secondary Emergency Contact: Plummer,Kim Address: [id number] [place], [place] Phone: [phone] Work Phone: [phone] Mobile Phone: [phone] Relation: Daughter  Code Status: Full Code Goals of Care: Advanced Directive information Advanced Directives 09/05/2015  Does patient have an advance directive? Yes  Type of Advance Directive Out of facility DNR (pink MOST or yellow form)  Does patient want to make changes to advanced directive? No - Patient declined  Copy of advanced directive(s) in chart? Yes      Chief Complaint  Patient presents with  . Readmit To SNF    Readmission    HPI: Patient is a 82 y.o. female with h/o htn, afib on eliquis, migraines on depakote, CVA with residual left-sided weakness was seen today for readmission to [facility] 5/16 after hospitalization for increased left-sided weakness.  CT head /MRI brain was unremarkable and no ECG changes. She was not given tPA due to rapidly improving symptoms. Left-sided weakness was thought to be due to prior CVA and UTI that worsened symptoms. She has completed IV ceftriaxone in the hospital.  She is here for short term rehab.  When seen, she was going to therapy.  Her daughter was also here and they had been to the ice cream social program.  BP was not controlled and NP Medina-Vargas previously added amlodipine to help with that.  She is getting PT, OT, ST.    When seen, she did  not provide much history and did not seem interested in talking at all.    Past Medical History  Diagnosis Date  . Arthritis   . Hypertension   . Migraine   . Colon polyp   . Hearing difficulty   . Torus palatinus   . Atrial flutter ([place]) January, 2012  . Stroke ([place]) 08/02/12     right lenticular nucleus infarct  . Left leg weakness 02/05/2013  . Chronic anticoagulation   . High cholesterol   . Atrial fibrillation ([place])     on Eliquis Rx  . Mild cognitive impairment with memory loss   . Malnutrition ([place])   . TIA (transient ischemic attack)   . Constipation   . Osteoporosis   . Hemorrhoids   . Left-sided weakness   . Acute encephalopathy   . HLD (hyperlipidemia)   . Acute cystitis without hematuria   . A-fib [facility])    Past Surgical History  Procedure Laterality Date  . Cataract extraction w/ intraocular lens  implant, bilateral  2006-2008  . Ganglion cyst excision Bilateral 1938,1954,2003,2005    "wrists/hand" (08/01/2012)  . Cardioversion  05/19/2010    Dr. Einar Gip  . Tonsillectomy  ~ 1935  . Appendectomy  02/19/53    `  . Mouth surgery      [name]    reports that she has quit smoking. She has never used smokeless tobacco. She reports that she does not drink alcohol or use illicit drugs. Social History   Social History  .  Marital Status: Widowed    Spouse Name: N/A  . Number of Children: 3  . Years of Education: college   Occupational History  . editorial work for [employer]    Social History Main Topics  . Smoking status: Former Research scientist (life sciences)  . Smokeless tobacco: Never Used  . Alcohol Use: No     Comment: 08/01/2012 "glass of wine on special occasions"  . Drug Use: No  . Sexual Activity: No   Other Topics Concern  . Not on file   Social History Narrative   Patient lives at home with daughters.   Caffeine use: 1/2-1 cup of coffee occasionally         Diet: Regular      Do you drink/ eat things with caffeine? Yes      Marital status:   Widowed                             What year were you married ?       Do you live in a house, apartment,assistred living, condo, trailer, etc.)? House      Is it one or more stories? Yes      How many persons live in your home ? 4      Do you have any pets in your home ?(please list) Yes/ 1 Dog, 2 Cats      Current or past profession: Editor      Do you exercise? No                             Type & how often:      Do you have a living will? Yes      Do you have a DNR form?  Yes                     If not, do you want to discuss one?       Do you have signed POA?HPOA forms? Yes                If so, please bring to your        appointment      regular    Functional Status Survey:    Family History  Problem Relation Age of Onset  . Colon cancer Father   . Breast cancer    . Brain cancer    . Cancer Mother     breast  . Aneurysm Mother     brain  . Heart attack Neg Hx   . Diabetes Neg Hx   . Hypertension Neg Hx   . Stroke Mother     Health Maintenance  Topic Date Due  . INFLUENZA VACCINE  10/28/2015  . TETANUS/TDAP  02/04/2018  . DEXA SCAN  Completed  . ZOSTAVAX  Completed  . PNA vac Low Risk Adult  Completed    Allergies  Allergen Reactions  . Aricept [Donepezil Hcl] Hypertension    With N/V  . Tramadol Hcl Other (See Comments)     lethargy, nausea  . Alendronate Sodium Rash and Other (See Comments)    puffy eyes      Medication List       This list is accurate as of: 08/15/15 11:59 PM.  Always use your most recent med list.               acetaminophen  325 MG tablet  Commonly known as:  TYLENOL  Take 2 tablets (650 mg total) by mouth every 4 (four) hours as needed for mild pain (or temp >/= 99.5 F).     amLODipine 2.5 MG tablet  Commonly known as:  NORVASC  Take 2.5 mg by mouth daily.     b complex vitamins tablet  Take 1 tablet by mouth 2 (two) times daily.     CALCIUM 600 + D 600-200 MG-UNIT Tabs  Generic drug:  Calcium Carb-Cholecalciferol  Take 1  tablet by mouth 2 (two) times daily.     cetirizine 10 MG tablet  Commonly known as:  ZYRTEC  Take 10 mg by mouth at bedtime.     COLACE PO  Take 100 mg by mouth 2 (two) times daily.     diphenhydramine-acetaminophen 25-500 MG Tabs tablet  Commonly known as:  TYLENOL PM  Take 2 tablets by mouth at bedtime as needed (sleep).     divalproex 125 MG DR tablet  Commonly known as:  DEPAKOTE  Take 2 tablets (250 mg total) by mouth 2 (two) times daily.     ELIQUIS 2.5 MG Tabs tablet  Generic drug:  apixaban  TAKE 1 TABLET BY MOUTH TWICE DAILY     famotidine 20 MG tablet  Commonly known as:  PEPCID  Take 1 tablet (20 mg total) by mouth 2 (two) times daily.     hydrocortisone 2.5 % rectal cream  Commonly known as:  ANUSOL-HC  Place 1 application rectally as needed for hemorrhoids or itching.     hydrocortisone 25 MG suppository  Commonly known as:  ANUSOL-HC  Place 25 mg rectally as needed for hemorrhoids or itching.     Melatonin 3 MG Tabs  Take 1 tablet by mouth at bedtime     mirabegron ER 25 MG Tb24 tablet  Commonly known as:  MYRBETRIQ  Take 1 tablet (25 mg total) by mouth daily.     multivitamin with minerals Tabs tablet  Take 1 tablet by mouth daily.     olopatadine 0.1 % ophthalmic solution  Commonly known as:  PATANOL  Place 1 drop into both eyes 2 (two) times daily.     ondansetron 4 MG tablet  Commonly known as:  ZOFRAN  Take 1 tablet (4 mg total) by mouth every 6 (six) hours as needed for nausea.     PROCEL 100 Powd  Take 1 scoop by mouth 2 (two) times daily.     senna-docusate 8.6-50 MG tablet  Commonly known as:  Senokot-S  Take 1 tablet by mouth at bedtime as needed for mild constipation.     Vitamin D 2000 units tablet  Take 2,000 Units by mouth daily.        Review of Systems  Unable to perform ROS: dementia  pt unwilling to answer even when able  Filed Vitals:   08/15/15 1617  BP: 137/71  Pulse: 75  Temp: 98 F (36.7 C)  TempSrc: Oral    Resp: 18  Height: 5\' 6"  (1.676 m)  Weight: 127 lb (57.607 kg)  SpO2: 95%   Body mass index is 20.51 kg/(m^2). Physical Exam  Constitutional: No distress.  HENT:  Head: Normocephalic and atraumatic.  Right Ear: External ear normal.  Left Ear: External ear normal.  Nose: Nose normal.  Eyes: EOM are normal. Pupils are equal, round, and reactive to light.  Neck: Neck supple. No JVD present.  Cardiovascular: Intact distal pulses.   irreg irreg  Pulmonary/Chest: Effort normal and breath sounds normal. No respiratory distress. She has no wheezes. She has no rales.  Abdominal: Soft. Bowel sounds are normal. She exhibits no distension and no mass. There is no tenderness.  Musculoskeletal: She exhibits no edema or tenderness.  4/5 Left, 5/5 right strength  Lymphadenopathy:    She has no cervical adenopathy.  Neurological: She is alert.  Skin: Skin is warm and dry.  Psychiatric:  Had trouble getting her to focus on questions and follow commands    Labs reviewed: Basic Metabolic Panel:  Recent Labs  08/07/15 0551 08/08/15 0539  08/09/15 0633 08/10/15 0530 08/12/15 0228 08/14/15 08/19/15  NA 144 148*  < > 144 145 142 145 141  K 3.9 4.0  < > 4.1 3.9 4.2 3.9 3.9  CL 108 108  < > 109 111 108  --   --   CO2 26 29  < > 25 24 24   --   --   GLUCOSE 101* 95  < > 102* 92 89  --   --   BUN 15 9  < > 10 7 8 12 14   CREATININE 0.80 0.74  < > 0.76 0.65 0.73 0.8 1.1  CALCIUM 9.1 8.7*  < > 8.3* 7.9* 7.9*  --   --   MG 2.0 2.1  --  2.0  --   --   --   --   < > = values in this interval not displayed. Liver Function Tests:  Recent Labs  06/23/15 1645 08/07/15 0135 08/08/15 0539  AST 22 24 23   ALT 10* 11* 12*  ALKPHOS 48 52 43  BILITOT 0.4 0.5 0.6  PROT 5.2* 6.1* 6.0*  ALBUMIN 3.1* 3.5 3.3*    Recent Labs  10/30/14 2300  LIPASE 44    Recent Labs  06/23/15 1643 08/08/15 1303  AMMONIA 21 16   CBC:  Recent Labs  08/08/15 0539 08/09/15 0633 08/12/15 0228 08/14/15  08/19/15  WBC 5.3 4.9 5.1 4.0 6.8  NEUTROABS 3.0 3.1  --  2 4148  HGB 13.0 12.4 12.5 13.0 13.4  HCT 40.1 38.4 38.9 40 40  MCV 96.2 93.7 94.2  --   --   PLT 172 164 143* 166 198   Cardiac Enzymes:  Recent Labs  10/31/14 1510 01/01/15 1311 07/31/15 1115  TROPONINI <0.03 <0.03 <0.03   BNP: Invalid input(s): POCBNP Lab Results  Component Value Date   HGBA1C 5.7* 08/07/2015   Lab Results  Component Value Date   TSH 0.301* 08/07/2015   Lab Results  Component Value Date   [id number]* 08/08/2015   Lab Results  Component Value Date   FOLATE >20.0 02/12/2013   No results found for: IRON, TIBC, FERRITIN  Imaging and Procedures obtained prior to SNF admission: Ct Head Wo Contrast  08/07/2015  CLINICAL DATA:  82 year old female with acute onset left-sided weakness and slurred speech. Code stroke. EXAM: CT HEAD WITHOUT CONTRAST TECHNIQUE: Contiguous axial images were obtained from the base of the skull through the vertex without intravenous contrast. COMPARISON:  CT dated 06/23/2015 FINDINGS: The ventricles are dilated and the sulci are prominent compatible with age-related atrophy. Periventricular and deep white matter hypodensities represent chronic microvascular ischemic changes. There is a focal area old infarct and encephalomalacia in the left occipital lobe. There is no intracranial hemorrhage. No mass effect or midline shift identified. The visualized paranasal sinuses and mastoid air cells are well aerated. The calvarium is intact. IMPRESSION: No acute intracranial hemorrhage. Age-related  atrophy and chronic microvascular ischemic disease. Left occipital old infarct and encephalomalacia. If symptoms persist and there are no contraindications, MRI may provide better evaluation if clinically indicated. These results were called by telephone at the time of interpretation on 08/07/2015 at 2:04 am to Dr. Nicole Kindred, who verbally acknowledged these results. Electronically Signed   By:  Anner Crete M.D.   On: 08/07/2015 02:05   Mr [name] Head Wo Contrast  08/08/2015  CLINICAL DATA:  Slurred speech and severe LEFT-sided weakness. History of stroke and mild LEFT-sided weakness, mild dementia, hypertension, migraines, atrial fibrillation on Eliquis. EXAM: MRI HEAD WITHOUT CONTRAST MRA HEAD WITHOUT CONTRAST TECHNIQUE: Multiplanar, multiecho pulse sequences of the brain and surrounding structures were obtained without intravenous contrast. Angiographic images of the head were obtained using MRA technique without contrast. COMPARISON:  CT HEAD Aug 07, 2015 at 0150 hours and MRI/MRA head December 18, 2014 FINDINGS: MRI HEAD FINDINGS INTRACRANIAL CONTENTS: No reduced diffusion to suggest acute ischemia. No susceptibility artifact to suggest hemorrhage. Old cystic RIGHT basal ganglia lacunar infarct with ex vacuo dilatation RIGHT lateral ventricle. Moderate to severe ventriculomegaly on the basis of global parenchymal brain volume loss. Old small RIGHT cerebellar infarcts. LEFT parietal occipital encephalomalacia. Confluent supratentorial and pontine white matter FLAIR T2 hyperintensities. No midline shift, mass effect or mass lesions. No abnormal extra-axial fluid collections. ORBITS: The included ocular globes and orbital contents are non-suspicious. Status post bilateral ocular lens implants. SINUSES: The mastoid air-cells and included paranasal sinuses are well-aerated. SKULL/SOFT TISSUES: No abnormal sellar expansion. No suspicious calvarial bone marrow signal. Generalized bright T1 calvarial bone marrow signal compatible with osteopenia. Craniocervical junction maintained. MRA HEAD FINDINGS (mildly motion degraded examination) Anterior circulation: Normal flow related enhancement of the included cervical, petrous, cavernous and supraclinoid internal carotid arteries. Patent anterior communicating artery. Similar tandem high-grade stenosis RIGHT greater than LEFT A2 and A3 segments. Moderate  luminal irregularity mid to distal MCA branches. No large vessel occlusion, aneurysm. Posterior circulation: Codominant vertebral artery's. Basilar artery is patent, with normal flow related enhancement of the main branch vessels. Mild stenosis RIGHT P1-2 junction. Moderate stenosis LEFT distal P2 segment. No large vessel occlusion, aneurysm. IMPRESSION: MRI HEAD: No acute intracranial process, specifically no acute ischemia. Chronic changes including old RIGHT basal ganglia lacunar infarct and old LEFT parieto-occipital lobe infarct (MCA versus watershed territory). Severe chronic small vessel ischemic disease. MRA HEAD: No emergent large vessel occlusion. Stable chronic changes including tandem high-grade stenosis anterior cerebral arteries, moderate luminal irregularity of the mid to distal MCA branches and moderate stenosis LEFT P2 segment. Electronically Signed   By: Elon Alas M.D.   On: 08/08/2015 00:31   Mr Brain Wo Contrast  08/08/2015  CLINICAL DATA:  Slurred speech and severe LEFT-sided weakness. History of stroke and mild LEFT-sided weakness, mild dementia, hypertension, migraines, atrial fibrillation on Eliquis. EXAM: MRI HEAD WITHOUT CONTRAST MRA HEAD WITHOUT CONTRAST TECHNIQUE: Multiplanar, multiecho pulse sequences of the brain and surrounding structures were obtained without intravenous contrast. Angiographic images of the head were obtained using MRA technique without contrast. COMPARISON:  CT HEAD Aug 07, 2015 at 0150 hours and MRI/MRA head December 18, 2014 FINDINGS: MRI HEAD FINDINGS INTRACRANIAL CONTENTS: No reduced diffusion to suggest acute ischemia. No susceptibility artifact to suggest hemorrhage. Old cystic RIGHT basal ganglia lacunar infarct with ex vacuo dilatation RIGHT lateral ventricle. Moderate to severe ventriculomegaly on the basis of global parenchymal brain volume loss. Old small RIGHT cerebellar infarcts. LEFT parietal occipital encephalomalacia. Confluent  supratentorial and pontine  white matter FLAIR T2 hyperintensities. No midline shift, mass effect or mass lesions. No abnormal extra-axial fluid collections. ORBITS: The included ocular globes and orbital contents are non-suspicious. Status post bilateral ocular lens implants. SINUSES: The mastoid air-cells and included paranasal sinuses are well-aerated. SKULL/SOFT TISSUES: No abnormal sellar expansion. No suspicious calvarial bone marrow signal. Generalized bright T1 calvarial bone marrow signal compatible with osteopenia. Craniocervical junction maintained. MRA HEAD FINDINGS (mildly motion degraded examination) Anterior circulation: Normal flow related enhancement of the included cervical, petrous, cavernous and supraclinoid internal carotid arteries. Patent anterior communicating artery. Similar tandem high-grade stenosis RIGHT greater than LEFT A2 and A3 segments. Moderate luminal irregularity mid to distal MCA branches. No large vessel occlusion, aneurysm. Posterior circulation: Codominant vertebral artery's. Basilar artery is patent, with normal flow related enhancement of the main branch vessels. Mild stenosis RIGHT P1-2 junction. Moderate stenosis LEFT distal P2 segment. No large vessel occlusion, aneurysm. IMPRESSION: MRI HEAD: No acute intracranial process, specifically no acute ischemia. Chronic changes including old RIGHT basal ganglia lacunar infarct and old LEFT parieto-occipital lobe infarct (MCA versus watershed territory). Severe chronic small vessel ischemic disease. MRA HEAD: No emergent large vessel occlusion. Stable chronic changes including tandem high-grade stenosis anterior cerebral arteries, moderate luminal irregularity of the mid to distal MCA branches and moderate stenosis LEFT P2 segment. Electronically Signed   By: Elon Alas M.D.   On: 08/08/2015 00:31    Assessment/Plan 1. Left-sided weakness -due to #2 making #3 worse -back now to cont pt/ot/st  2. Acute cystitis  without hematuria -completed course of abx at hospital -push fluids here to prevent recurrence  3. Hemiparesis affecting left side as late effect of cerebrovascular accident ([place]) -cont PT, OT  4. Chronic atrial fibrillation (HCC) -cont eliquis, rate controlled  5. Essential hypertension -bp controlled now today with addition of amlodipine  6. Slow transit constipation -cont senna s, push fluids, and anusol cream and suppositories as needed for her hemorrhoids  7. Vascular dementia, without behavioral disturbance -not on dementia meds -is on depakote for migraines per notes -currently functionally dependent and working with PT, OT, ST  8. Dysphagia -cont ST and modified diet, protein supplement, aspiration precautions  Family/ staff Communication: discussed with her daughter who was present, nursing staff  Labs/tests ordered: no new today  Gray L. Yazmine Sorey, D.O. [facility] [address], [place] [address] Cell Phone (Mon-Fri 8am-5pm):  [phone] On Call:  [phone] & follow prompts after 5pm & weekends Office Phone:  [phone] Office Fax:  [phone]

## 2015-08-19 LAB — BASIC METABOLIC PANEL
BUN: 14 mg/dL (ref 4–21)
Creatinine: 1.1 mg/dL (ref 0.5–1.1)
Glucose: 184 mg/dL
POTASSIUM: 3.9 mmol/L (ref 3.4–5.3)
Sodium: 141 mmol/L (ref 137–147)

## 2015-08-19 LAB — CBC AND DIFFERENTIAL
HCT: 40 % (ref 36–46)
Hemoglobin: 13.4 g/dL (ref 12.0–16.0)
Neutrophils Absolute: 4148 /uL
Platelets: 198 10*3/uL (ref 150–399)
WBC: 6.8 10*3/mL

## 2015-09-05 ENCOUNTER — Encounter: Payer: Self-pay | Admitting: Adult Health

## 2015-09-05 ENCOUNTER — Non-Acute Institutional Stay (SKILLED_NURSING_FACILITY): Payer: Medicare Other | Admitting: Adult Health

## 2015-09-05 ENCOUNTER — Other Ambulatory Visit: Payer: Self-pay

## 2015-09-05 DIAGNOSIS — I4892 Unspecified atrial flutter: Secondary | ICD-10-CM

## 2015-09-05 DIAGNOSIS — F419 Anxiety disorder, unspecified: Secondary | ICD-10-CM

## 2015-09-05 DIAGNOSIS — I10 Essential (primary) hypertension: Secondary | ICD-10-CM

## 2015-09-05 DIAGNOSIS — Z8673 Personal history of transient ischemic attack (TIA), and cerebral infarction without residual deficits: Secondary | ICD-10-CM | POA: Diagnosis not present

## 2015-09-05 DIAGNOSIS — E46 Unspecified protein-calorie malnutrition: Secondary | ICD-10-CM

## 2015-09-05 DIAGNOSIS — K219 Gastro-esophageal reflux disease without esophagitis: Secondary | ICD-10-CM | POA: Diagnosis not present

## 2015-09-05 DIAGNOSIS — K5901 Slow transit constipation: Secondary | ICD-10-CM | POA: Diagnosis not present

## 2015-09-05 DIAGNOSIS — N3281 Overactive bladder: Secondary | ICD-10-CM

## 2015-09-05 DIAGNOSIS — M6289 Other specified disorders of muscle: Secondary | ICD-10-CM

## 2015-09-05 DIAGNOSIS — E559 Vitamin D deficiency, unspecified: Secondary | ICD-10-CM | POA: Diagnosis not present

## 2015-09-05 DIAGNOSIS — G43809 Other migraine, not intractable, without status migrainosus: Secondary | ICD-10-CM

## 2015-09-05 DIAGNOSIS — R531 Weakness: Secondary | ICD-10-CM

## 2015-09-05 DIAGNOSIS — G4700 Insomnia, unspecified: Secondary | ICD-10-CM | POA: Diagnosis not present

## 2015-09-05 MED ORDER — LORAZEPAM 0.5 MG PO TABS: ORAL_TABLET | ORAL | Status: DC

## 2015-09-05 NOTE — Progress Notes (Signed)
Patient ID: [name], female   DOB: 31-May-1924, 82 y.o.   MRN: [id number]    DATE:  09/05/2015   MRN:  [id number]  BIRTHDAY: Feb 17, 1925  Facility:  [facility]ursing Home Location:  [place] [facility] [place] Room Number: 705-P  LEVEL OF CARE:  SNF [phone])  Contact Information    Name Relation Home Work [place] Daughter [phone]  [phone]   Plummer,Kim Daughter [phone]   Burkland,Kit Daughter [phone]  [phone]       Code Status History    Date Active Date Inactive Code Status Order ID Comments User Context   08/07/2015  4:52 AM 08/12/2015  8:32 PM Partial Code [id number]  Edwin Dada, MD Inpatient   12/18/2014  7:17 PM 12/21/2014  2:11 PM Partial Code [id number]  Willia Craze, NP Inpatient   10/31/2014  3:32 AM 10/31/2014  7:24 PM Full Code [id number]  Theressa Millard, MD Inpatient   08/28/2013 12:58 AM 08/30/2013  4:40 PM Full Code [id number]  Kelvin Cellar, MD Inpatient   03/09/2013  6:50 PM 03/12/2013  6:27 PM Full Code [id number]  Verlee Monte, MD Inpatient   02/15/2013  6:26 PM 03/03/2013 12:40 PM Full Code [id number]  Cathlyn Parsons, PA-C Inpatient   02/12/2013 12:50 PM 02/15/2013  6:26 PM Full Code [id number]  Velvet Bathe, MD Inpatient   02/05/2013  7:35 PM 02/07/2013  8:48 PM Full Code [id number]  Kelvin Cellar, MD Inpatient   08/01/2012  5:49 PM 08/03/2012  2:28 PM Full Code [id number]  Melton Alar, PA-C Inpatient    Questions for Most Recent Historical Code Status (Order [id number])    Question Answer Comment   In the event of cardiac or respiratory ARREST: Initiate Code Blue, Call Rapid Response Yes    In the event of cardiac or respiratory ARREST: Perform CPR Yes    In the event of cardiac or respiratory ARREST: Perform Intubation/Mechanical Ventilation No    In the event of cardiac or respiratory ARREST: Use NIPPV/BiPAp only if indicated Yes    In the event of cardiac or respiratory ARREST: Administer  ACLS medications if indicated Yes    In the event of cardiac or respiratory ARREST: Perform Defibrillation or Cardioversion if indicated Yes        Chief Complaint  Patient presents with  . Discharge Note    HISTORY OF PRESENT ILLNESS:  This is an 82 year old female who is for discharge home with Home health PT, OT, CNA, Nursing and ST.  She has been admitted to [facility] on 08/12/15 from [facility]. She has PMH of hypertension, atrial fibrillation on Eliquis, migraines on Depakote, CVA with residual left sided weakness and mild dementia. She had episode of left-sided weakness. CT head /MRI brain was unremarkable and no ECG changes. She was not given tPA due to rapidly improving symptoms. Left-sided weakness was thought to be due to prior CVA and UTI. She has completed IV ceftriaxone in the hospital.   She has been screaming while in bed and at the hallways. She verbalized not being in pain. She was recently started on Ativan for anxiety.  She was, also, recently started on low-dose Amlodipine (2.5 mg) for hypertension.  Patient was admitted to this facility for short-term rehabilitation after the patient's recent hospitalization.  Patient has completed SNF rehabilitation and therapy has cleared the patient for discharge.  PAST MEDICAL HISTORY:  Past Medical History  Diagnosis Date  . Arthritis   .  Hypertension   . Migraine   . Colon polyp   . Hearing difficulty   . Torus palatinus   . Atrial flutter ([place]) January, 2012  . Stroke ([place]) 08/02/12     right lenticular nucleus infarct  . Left leg weakness 02/05/2013  . Chronic anticoagulation   . High cholesterol   . Atrial fibrillation ([place])     on Eliquis Rx  . Mild cognitive impairment with memory loss   . Malnutrition ([place])   . TIA (transient ischemic attack)   . Constipation   . Osteoporosis   . Hemorrhoids   . Left-sided weakness   . Acute encephalopathy   . HLD (hyperlipidemia)   . Acute cystitis without  hematuria   . A-fib [facility])      CURRENT MEDICATIONS: Reviewed  Patient's Medications  New Prescriptions   No medications on file  Previous Medications   ACETAMINOPHEN (TYLENOL) 325 MG TABLET    Take 2 tablets (650 mg total) by mouth every 4 (four) hours as needed for mild pain (or temp >/= 99.5 F).   AMLODIPINE (NORVASC) 2.5 MG TABLET    Take 2.5 mg by mouth daily.   B COMPLEX VITAMINS TABLET    Take 1 tablet by mouth 2 (two) times daily.    CALCIUM CARB-CHOLECALCIFEROL (CALCIUM 600 + D) 600-200 MG-UNIT TABS    Take 1 tablet by mouth 2 (two) times daily.   CETIRIZINE (ZYRTEC) 10 MG TABLET    Take 10 mg by mouth at bedtime.   DIPHENHYDRAMINE-ACETAMINOPHEN (TYLENOL PM) 25-500 MG TABS    Take 2 tablets by mouth at bedtime as needed (sleep).    DIVALPROEX (DEPAKOTE) 125 MG DR TABLET    Take 2 tablets (250 mg total) by mouth 2 (two) times daily.   DOCUSATE SODIUM (COLACE PO)    Take 100 mg by mouth 2 (two) times daily.    ELIQUIS 2.5 MG TABS TABLET    TAKE 1 TABLET BY MOUTH TWICE DAILY   ERGOCALCIFEROL (VITAMIN D2) 2000 UNITS TABS    Take 1 tablet by mouth daily.   FAMOTIDINE (PEPCID) 20 MG TABLET    Take 20 mg by mouth 2 (two) times daily.   HYDROCORTISONE (ANUSOL-HC) 2.5 % RECTAL CREAM    Place 1 application rectally as needed for hemorrhoids or itching.   HYDROCORTISONE (ANUSOL-HC) 25 MG SUPPOSITORY    Place 25 mg rectally as needed for hemorrhoids or itching.   LORAZEPAM (ATIVAN) 0.5 MG TABLET    Take 0.5 mg by mouth every 8 (eight) hours as needed for anxiety. Give 1/2 tablet BID PRN   MELATONIN 3 MG TABS    Take 1 tablet by mouth at bedtime   MIRABEGRON ER (MYRBETRIQ) 25 MG TB24 TABLET    Take 1 tablet (25 mg total) by mouth daily.   MULTIPLE VITAMIN (MULTIVITAMIN WITH MINERALS) TABS    Take 1 tablet by mouth daily.   OLOPATADINE (PATANOL) 0.1 % OPHTHALMIC SOLUTION    Place 1 drop into both eyes 2 (two) times daily.   ONDANSETRON (ZOFRAN) 4 MG TABLET    Take 1 tablet (4 mg total) by mouth  every 6 (six) hours as needed for nausea.   PROTEIN (PROCEL 100) POWD    Take 1 scoop by mouth 2 (two) times daily.   SENNA-DOCUSATE (SENOKOT-S) 8.6-50 MG TABLET    Take 1 tablet by mouth at bedtime as needed for mild constipation.  Modified Medications   Modified Medication Previous Medication   LORAZEPAM (ATIVAN)  0.5 MG TABLET LORazepam (ATIVAN) 0.5 MG tablet      1 tablet by mouth daily at 4 PM scheduled, take 1/2 tablet by mouth twice daily as needed for anxiety    Take 0.5 mg by mouth. Give at 4PM daily  Discontinued Medications   CHOLECALCIFEROL (VITAMIN D) 2000 UNITS TABLET    Take 2,000 Units by mouth daily.   FAMOTIDINE (PEPCID) 20 MG TABLET    Take 1 tablet (20 mg total) by mouth 2 (two) times daily.     Allergies  Allergen Reactions  . Aricept [Donepezil Hcl] Hypertension    With N/V  . Tramadol Hcl Other (See Comments)     lethargy, nausea  . Alendronate Sodium Rash and Other (See Comments)    puffy eyes     REVIEW OF SYSTEMS:  GENERAL: no change in appetite, no fatigue, no weight changes, no fever, chills or weakness EYES: Denies change in vision, dry eyes, eye pain, itching or discharge EARS: Denies change in hearing, ringing in ears, or earache NOSE: Denies nasal congestion or epistaxis MOUTH and THROAT: Denies oral discomfort, gingival pain or bleeding, pain from teeth or hoarseness   RESPIRATORY: no cough, SOB, DOE, wheezing, hemoptysis CARDIAC: no chest pain, edema or palpitations GI: no abdominal pain, diarrhea, constipation, heart burn, nausea or vomiting GU: Denies dysuria, frequency, hematuria, incontinence, or discharge PSYCHIATRIC: Denies feeling of depression or anxiety. No report of hallucinations, insomnia, paranoia, or agitation    PHYSICAL EXAMINATION  GENERAL APPEARANCE: Well nourished. In no acute distress. Normal body habitus SKIN:  Skin is warm and dry.  HEAD: Normal in size and contour. No evidence of trauma EYES: Lids open and close  normally. No blepharitis, entropion or ectropion. PERRL. Conjunctivae are clear and sclerae are white. Lenses are without opacity EARS: Pinnae are normal. Patient hears normal voice tunes of the examiner MOUTH and THROAT: Lips are without lesions. Oral mucosa is moist and without lesions. Tongue is normal in shape, size, and color and without lesions NECK: supple, trachea midline, no neck masses, no thyroid tenderness, no thyromegaly LYMPHATICS: no LAN in the neck, no supraclavicular LAN RESPIRATORY: breathing is even & unlabored, BS CTAB CARDIAC: Irregularly irregular, no murmur,no extra heart sounds, no edema GI: abdomen soft, normal BS, no masses, no tenderness, no hepatomegaly, no splenomegaly EXTREMITIES:  Able to move 4 extremities, left-sided weakness PSYCHIATRIC: Alert to person, disoriented to time and place. Affect and behavior are appropriate  LABS/RADIOLOGY: Labs reviewed: Basic Metabolic Panel:  Recent Labs  08/07/15 0551 08/08/15 0539  08/09/15 0633 08/10/15 0530 08/12/15 0228 08/14/15 08/19/15  NA 144 148*  < > 144 145 142 145 141  K 3.9 4.0  < > 4.1 3.9 4.2 3.9 3.9  CL 108 108  < > 109 111 108  --   --   CO2 26 29  < > _0 --   --   GLUCOSE 101* 95  < > 102* 92 89  --   --   BUN 15 9  < > _1 CREATININE 0.80 0.74  < > 0.76 0.65 0.73 0.8 1.1  CALCIUM 9.1 8.7*  < > 8.3* 7.9* 7.9*  --   --   MG 2.0 2.1  --  2.0  --   --   --   --   < > = values in this interval not displayed. Liver Function Tests:  Recent Labs  06/23/15 1645 08/07/15 0135 08/08/15 0539  AST _0 ALT 10* 11* 12*  ALKPHOS 48 52 43  BILITOT 0.4 0.5 0.6  PROT 5.2* 6.1* 6.0*  ALBUMIN 3.1* 3.5 3.3*    Recent Labs  10/30/14 2300  LIPASE 44    Recent Labs  06/23/15 1643 08/08/15 1303  AMMONIA 21 16   CBC:  Recent Labs  08/08/15 0539 08/09/15 0633 08/12/15 0228 08/14/15 08/19/15  WBC 5.3 4.9 5.1 4.0 6.8  NEUTROABS 3.0 3.1  --  2 4148  HGB 13.0 12.4 12.5  13.0 13.4  HCT 40.1 38.4 38.9 40 40  MCV 96.2 93.7 94.2  --   --   PLT 172 164 143* 166 198    Lipid Panel:  Recent Labs  12/19/14 0433 08/07/15 0551  HDL 47 47   Cardiac Enzymes:  Recent Labs  10/31/14 1510 01/01/15 1311 07/31/15 1115  TROPONINI <0.03 <0.03 <0.03   CBG:  Recent Labs  06/23/15 1715 08/07/15 0202  GLUCAP 79 102*      Ct Head Wo Contrast  08/07/2015  CLINICAL DATA:  82 year old female with acute onset left-sided weakness and slurred speech. Code stroke. EXAM: CT HEAD WITHOUT CONTRAST TECHNIQUE: Contiguous axial images were obtained from the base of the skull through the vertex without intravenous contrast. COMPARISON:  CT dated 06/23/2015 FINDINGS: The ventricles are dilated and the sulci are prominent compatible with age-related atrophy. Periventricular and deep white matter hypodensities represent chronic microvascular ischemic changes. There is a focal area old infarct and encephalomalacia in the left occipital lobe. There is no intracranial hemorrhage. No mass effect or midline shift identified. The visualized paranasal sinuses and mastoid air cells are well aerated. The calvarium is intact. IMPRESSION: No acute intracranial hemorrhage. Age-related atrophy and chronic microvascular ischemic disease. Left occipital old infarct and encephalomalacia. If symptoms persist and there are no contraindications, MRI may provide better evaluation if clinically indicated. These results were called by telephone at the time of interpretation on 08/07/2015 at 2:04 am to Dr. Nicole Kindred, who verbally acknowledged these results. Electronically Signed   By: Anner Crete M.D.   On: 08/07/2015 02:05   Mr [name] Head Wo Contrast  08/08/2015  CLINICAL DATA:  Slurred speech and severe LEFT-sided weakness. History of stroke and mild LEFT-sided weakness, mild dementia, hypertension, migraines, atrial fibrillation on Eliquis. EXAM: MRI HEAD WITHOUT CONTRAST MRA HEAD WITHOUT CONTRAST  TECHNIQUE: Multiplanar, multiecho pulse sequences of the brain and surrounding structures were obtained without intravenous contrast. Angiographic images of the head were obtained using MRA technique without contrast. COMPARISON:  CT HEAD Aug 07, 2015 at 0150 hours and MRI/MRA head December 18, 2014 FINDINGS: MRI HEAD FINDINGS INTRACRANIAL CONTENTS: No reduced diffusion to suggest acute ischemia. No susceptibility artifact to suggest hemorrhage. Old cystic RIGHT basal ganglia lacunar infarct with ex vacuo dilatation RIGHT lateral ventricle. Moderate to severe ventriculomegaly on the basis of global parenchymal brain volume loss. Old small RIGHT cerebellar infarcts. LEFT parietal occipital encephalomalacia. Confluent supratentorial and pontine white matter FLAIR T2 hyperintensities. No midline shift, mass effect or mass lesions. No abnormal extra-axial fluid collections. ORBITS: The included ocular globes and orbital contents are non-suspicious. Status post bilateral ocular lens implants. SINUSES: The mastoid air-cells and included paranasal sinuses are well-aerated. SKULL/SOFT TISSUES: No abnormal sellar expansion. No suspicious calvarial bone marrow signal. Generalized bright T1 calvarial bone marrow signal compatible with osteopenia. Craniocervical junction maintained. MRA HEAD FINDINGS (mildly motion degraded examination) Anterior circulation: Normal flow related enhancement of the included cervical, petrous, cavernous and supraclinoid internal  carotid arteries. Patent anterior communicating artery. Similar tandem high-grade stenosis RIGHT greater than LEFT A2 and A3 segments. Moderate luminal irregularity mid to distal MCA branches. No large vessel occlusion, aneurysm. Posterior circulation: Codominant vertebral artery's. Basilar artery is patent, with normal flow related enhancement of the main branch vessels. Mild stenosis RIGHT P1-2 junction. Moderate stenosis LEFT distal P2 segment. No large vessel  occlusion, aneurysm. IMPRESSION: MRI HEAD: No acute intracranial process, specifically no acute ischemia. Chronic changes including old RIGHT basal ganglia lacunar infarct and old LEFT parieto-occipital lobe infarct (MCA versus watershed territory). Severe chronic small vessel ischemic disease. MRA HEAD: No emergent large vessel occlusion. Stable chronic changes including tandem high-grade stenosis anterior cerebral arteries, moderate luminal irregularity of the mid to distal MCA branches and moderate stenosis LEFT P2 segment. Electronically Signed   By: Elon Alas M.D.   On: 08/08/2015 00:31   Mr Brain Wo Contrast  08/08/2015  CLINICAL DATA:  Slurred speech and severe LEFT-sided weakness. History of stroke and mild LEFT-sided weakness, mild dementia, hypertension, migraines, atrial fibrillation on Eliquis. EXAM: MRI HEAD WITHOUT CONTRAST MRA HEAD WITHOUT CONTRAST TECHNIQUE: Multiplanar, multiecho pulse sequences of the brain and surrounding structures were obtained without intravenous contrast. Angiographic images of the head were obtained using MRA technique without contrast. COMPARISON:  CT HEAD Aug 07, 2015 at 0150 hours and MRI/MRA head December 18, 2014 FINDINGS: MRI HEAD FINDINGS INTRACRANIAL CONTENTS: No reduced diffusion to suggest acute ischemia. No susceptibility artifact to suggest hemorrhage. Old cystic RIGHT basal ganglia lacunar infarct with ex vacuo dilatation RIGHT lateral ventricle. Moderate to severe ventriculomegaly on the basis of global parenchymal brain volume loss. Old small RIGHT cerebellar infarcts. LEFT parietal occipital encephalomalacia. Confluent supratentorial and pontine white matter FLAIR T2 hyperintensities. No midline shift, mass effect or mass lesions. No abnormal extra-axial fluid collections. ORBITS: The included ocular globes and orbital contents are non-suspicious. Status post bilateral ocular lens implants. SINUSES: The mastoid air-cells and included paranasal  sinuses are well-aerated. SKULL/SOFT TISSUES: No abnormal sellar expansion. No suspicious calvarial bone marrow signal. Generalized bright T1 calvarial bone marrow signal compatible with osteopenia. Craniocervical junction maintained. MRA HEAD FINDINGS (mildly motion degraded examination) Anterior circulation: Normal flow related enhancement of the included cervical, petrous, cavernous and supraclinoid internal carotid arteries. Patent anterior communicating artery. Similar tandem high-grade stenosis RIGHT greater than LEFT A2 and A3 segments. Moderate luminal irregularity mid to distal MCA branches. No large vessel occlusion, aneurysm. Posterior circulation: Codominant vertebral artery's. Basilar artery is patent, with normal flow related enhancement of the main branch vessels. Mild stenosis RIGHT P1-2 junction. Moderate stenosis LEFT distal P2 segment. No large vessel occlusion, aneurysm. IMPRESSION: MRI HEAD: No acute intracranial process, specifically no acute ischemia. Chronic changes including old RIGHT basal ganglia lacunar infarct and old LEFT parieto-occipital lobe infarct (MCA versus watershed territory). Severe chronic small vessel ischemic disease. MRA HEAD: No emergent large vessel occlusion. Stable chronic changes including tandem high-grade stenosis anterior cerebral arteries, moderate luminal irregularity of the mid to distal MCA branches and moderate stenosis LEFT P2 segment. Electronically Signed   By: Elon Alas M.D.   On: 08/08/2015 00:31   Dg Abd Portable 1v  08/11/2015  CLINICAL DATA:  Possible constipation as the patient last had a bowel movement 3 days ago. Nausea and vomiting yesterday. EXAM: PORTABLE ABDOMEN - 1 VIEW COMPARISON:  CT abdomen and pelvis 12/18/2014 and earlier. Abdomen x-ray 12/18/2014. FINDINGS: Bowel gas pattern unremarkable without evidence of obstruction or significant ileus. Expected stool burden in the  colon. No visible opaque urinary tract calculi.  Degenerative changes involving the lower thoracic and lumbar spine. IMPRESSION: No acute abdominal abnormality.  Expected colonic stool burden. Electronically Signed   By: Evangeline Dakin M.D.   On: 08/11/2015 11:17    ASSESSMENT/PLAN:  Left-sided weakness - this was thought to be due to prior CVA and UTI; MRI brain negative for any acute stroke; for Home health PT, OT, CNA, Nursing and ST; follow-up with Dr. Leonie Man, neurology, in 1 month  Hx of CVA - continue Eliquis 2.5 mg 1 tab PO BID  OAB - continue Myrbetriq ER 25 mg 1 tab by mouth daily   Constipation - continue senna S1 tab by mouth daily at bedtime when necessary and Colace 100 mg 1 capsule by mouth twice a day  Vitamin D deficiency - continue vitamin D 2 2000 units 1 tab by mouth daily  GERD - continue famotidine 20 mg 1 tab by mouth twice a day  Migraine - continue Depakote 125 mg take 2 tabs = 250 mg by mouth twice a day  Atrial flutter - rate controlled; continue Eliquis  Insomnia - continue melatonin 3 mg 1 tab by mouth daily at bedtime  Protein calorie malnutrition - albumin 3.3; continue Procel 1 scoop by mouth twice a day  Hypertension - recently started on amlodipine 2.5 mg 1 tab by mouth daily  Anxiety - recently started on Ativan 0.5 mg PO Q 4 PM and TID PRN; will discontinue Ativan PRN upon discharged       I have filled out patient's discharge paperwork and written prescriptions.  Patient will receive home health PT, OT, ST, Nursing and CNA.  DME provided:  none  Total discharge time: Greater than 30 minutes  Discharge time involved coordination of the discharge process with social worker, nursing staff and therapy department. Medical justification for home health services verified.     Durenda Age, NP [employer] [phone]

## 2015-09-05 NOTE — Telephone Encounter (Signed)
Rx faxed to [facility] @ [phone], phone number [phone]  

## 2015-09-08 ENCOUNTER — Encounter: Payer: Self-pay | Admitting: Gastroenterology

## 2015-09-09 DIAGNOSIS — I4892 Unspecified atrial flutter: Secondary | ICD-10-CM | POA: Diagnosis not present

## 2015-09-09 DIAGNOSIS — I69354 Hemiplegia and hemiparesis following cerebral infarction affecting left non-dominant side: Secondary | ICD-10-CM | POA: Diagnosis not present

## 2015-09-09 DIAGNOSIS — R1312 Dysphagia, oropharyngeal phase: Secondary | ICD-10-CM | POA: Diagnosis not present

## 2015-09-09 DIAGNOSIS — I4891 Unspecified atrial fibrillation: Secondary | ICD-10-CM | POA: Diagnosis not present

## 2015-09-09 DIAGNOSIS — G3184 Mild cognitive impairment, so stated: Secondary | ICD-10-CM | POA: Diagnosis not present

## 2015-09-09 DIAGNOSIS — I10 Essential (primary) hypertension: Secondary | ICD-10-CM | POA: Diagnosis not present

## 2015-09-09 DIAGNOSIS — Z7901 Long term (current) use of anticoagulants: Secondary | ICD-10-CM | POA: Diagnosis not present

## 2015-09-10 DIAGNOSIS — I4892 Unspecified atrial flutter: Secondary | ICD-10-CM | POA: Diagnosis not present

## 2015-09-10 DIAGNOSIS — G3184 Mild cognitive impairment, so stated: Secondary | ICD-10-CM | POA: Diagnosis not present

## 2015-09-10 DIAGNOSIS — I69354 Hemiplegia and hemiparesis following cerebral infarction affecting left non-dominant side: Secondary | ICD-10-CM | POA: Diagnosis not present

## 2015-09-10 DIAGNOSIS — I10 Essential (primary) hypertension: Secondary | ICD-10-CM | POA: Diagnosis not present

## 2015-09-10 DIAGNOSIS — I4891 Unspecified atrial fibrillation: Secondary | ICD-10-CM | POA: Diagnosis not present

## 2015-09-10 DIAGNOSIS — R1312 Dysphagia, oropharyngeal phase: Secondary | ICD-10-CM | POA: Diagnosis not present

## 2015-09-10 MED FILL — ONDANSETRON HCL 4 MG TABLET: 4 | 7 days supply | Qty: 30 | Fill #0

## 2015-09-10 MED FILL — LORazepam 0.5 MG TABS: 0.5 | 30 days supply | Qty: 30 | Fill #0

## 2015-09-12 ENCOUNTER — Encounter (HOSPITAL_BASED_OUTPATIENT_CLINIC_OR_DEPARTMENT_OTHER): Payer: Self-pay | Admitting: *Deleted

## 2015-09-12 ENCOUNTER — Telehealth: Payer: Self-pay | Admitting: *Deleted

## 2015-09-12 ENCOUNTER — Emergency Department (HOSPITAL_BASED_OUTPATIENT_CLINIC_OR_DEPARTMENT_OTHER)
Admission: EM | Admit: 2015-09-12 | Discharge: 2015-09-12 | Disposition: A | Discharge status: Home / Self Care | Payer: Medicare Other | Attending: Emergency Medicine | Admitting: Emergency Medicine

## 2015-09-12 ENCOUNTER — Emergency Department (HOSPITAL_BASED_OUTPATIENT_CLINIC_OR_DEPARTMENT_OTHER): Payer: Medicare Other

## 2015-09-12 DIAGNOSIS — R1312 Dysphagia, oropharyngeal phase: Secondary | ICD-10-CM | POA: Diagnosis not present

## 2015-09-12 DIAGNOSIS — I4891 Unspecified atrial fibrillation: Secondary | ICD-10-CM | POA: Insufficient documentation

## 2015-09-12 DIAGNOSIS — Z8673 Personal history of transient ischemic attack (TIA), and cerebral infarction without residual deficits: Secondary | ICD-10-CM | POA: Insufficient documentation

## 2015-09-12 DIAGNOSIS — N390 Urinary tract infection, site not specified: Secondary | ICD-10-CM | POA: Insufficient documentation

## 2015-09-12 DIAGNOSIS — Z87891 Personal history of nicotine dependence: Secondary | ICD-10-CM | POA: Diagnosis not present

## 2015-09-12 DIAGNOSIS — R51 Headache: Secondary | ICD-10-CM | POA: Diagnosis not present

## 2015-09-12 DIAGNOSIS — M1990 Unspecified osteoarthritis, unspecified site: Secondary | ICD-10-CM | POA: Diagnosis not present

## 2015-09-12 DIAGNOSIS — I10 Essential (primary) hypertension: Secondary | ICD-10-CM | POA: Insufficient documentation

## 2015-09-12 DIAGNOSIS — I4892 Unspecified atrial flutter: Secondary | ICD-10-CM | POA: Diagnosis not present

## 2015-09-12 DIAGNOSIS — R531 Weakness: Secondary | ICD-10-CM

## 2015-09-12 DIAGNOSIS — G3184 Mild cognitive impairment, so stated: Secondary | ICD-10-CM | POA: Diagnosis not present

## 2015-09-12 DIAGNOSIS — I69354 Hemiplegia and hemiparesis following cerebral infarction affecting left non-dominant side: Secondary | ICD-10-CM | POA: Diagnosis not present

## 2015-09-12 DIAGNOSIS — E785 Hyperlipidemia, unspecified: Secondary | ICD-10-CM | POA: Diagnosis not present

## 2015-09-12 DIAGNOSIS — Z79899 Other long term (current) drug therapy: Secondary | ICD-10-CM | POA: Insufficient documentation

## 2015-09-12 LAB — URINE MICROSCOPIC-ADD ON

## 2015-09-12 LAB — URINALYSIS, ROUTINE W REFLEX MICROSCOPIC
BILIRUBIN URINE: NEGATIVE
Glucose, UA: NEGATIVE mg/dL
Hgb urine dipstick: NEGATIVE
Ketones, ur: NEGATIVE mg/dL
NITRITE: NEGATIVE
PH: 7 (ref 5.0–8.0)
Protein, ur: NEGATIVE mg/dL
SPECIFIC GRAVITY, URINE: 1.015 (ref 1.005–1.030)

## 2015-09-12 LAB — CBC WITH DIFFERENTIAL/PLATELET
BASOS PCT: 1 %
Basophils Absolute: 0 10*3/uL (ref 0.0–0.1)
Eosinophils Absolute: 0.1 10*3/uL (ref 0.0–0.7)
Eosinophils Relative: 2 %
HEMATOCRIT: 37.4 % (ref 36.0–46.0)
HEMOGLOBIN: 12.4 g/dL (ref 12.0–15.0)
LYMPHS ABS: 1.7 10*3/uL (ref 0.7–4.0)
LYMPHS PCT: 31 %
MCH: 31.2 pg (ref 26.0–34.0)
MCHC: 33.2 g/dL (ref 30.0–36.0)
MCV: 94.2 fL (ref 78.0–100.0)
MONO ABS: 0.8 10*3/uL (ref 0.1–1.0)
MONOS PCT: 14 %
NEUTROS ABS: 2.9 10*3/uL (ref 1.7–7.7)
NEUTROS PCT: 52 %
Platelets: 180 10*3/uL (ref 150–400)
RBC: 3.97 MIL/uL (ref 3.87–5.11)
RDW: 13.6 % (ref 11.5–15.5)
WBC: 5.6 10*3/uL (ref 4.0–10.5)

## 2015-09-12 LAB — COMPREHENSIVE METABOLIC PANEL
ALBUMIN: 3.4 g/dL — AB (ref 3.5–5.0)
ALK PHOS: 49 U/L (ref 38–126)
ALT: 11 U/L — ABNORMAL LOW (ref 14–54)
ANION GAP: 7 (ref 5–15)
AST: 21 U/L (ref 15–41)
BILIRUBIN TOTAL: 0.4 mg/dL (ref 0.3–1.2)
BUN: 15 mg/dL (ref 6–20)
CO2: 31 mmol/L (ref 22–32)
Calcium: 9.1 mg/dL (ref 8.9–10.3)
Chloride: 104 mmol/L (ref 101–111)
Creatinine, Ser: 0.72 mg/dL (ref 0.44–1.00)
GFR calc Af Amer: >60 mL/min (ref >60)
GFR calc non Af Amer: >60 mL/min (ref >60)
GLUCOSE: 89 mg/dL (ref 65–99)
POTASSIUM: 4.2 mmol/L (ref 3.5–5.1)
SODIUM: 142 mmol/L (ref 135–145)
TOTAL PROTEIN: 6.4 g/dL — AB (ref 6.5–8.1)

## 2015-09-12 LAB — TROPONIN I

## 2015-09-12 LAB — MAGNESIUM: MAGNESIUM: 1.9 mg/dL (ref 1.7–2.4)

## 2015-09-12 LAB — I-STAT CG4 LACTIC ACID, ED: Lactic Acid, Venous: 0.66 mmol/L (ref 0.5–2.0)

## 2015-09-12 MED ORDER — CEPHALEXIN 500 MG PO CAPS: 500.0000 mg | ORAL_CAPSULE | Freq: Three times a day (TID) | ORAL | Status: AC

## 2015-09-12 MED FILL — CEPHALEXIN 500 MG CAPSULE: 500 | 10 days supply | Qty: 30 | Fill #0

## 2015-09-12 NOTE — ED Notes (Signed)
Patient gone to XRAY via stretcher.

## 2015-09-12 NOTE — ED Notes (Signed)
MD at bedside. 

## 2015-09-12 NOTE — ED Notes (Signed)
Weakness. Pale. She was in rehab after having a UTI. She came home 3 days ago.

## 2015-09-12 NOTE — Telephone Encounter (Signed)
Noted  

## 2015-09-12 NOTE — ED Provider Notes (Signed)
CSN: [id number]     Arrival date & time 09/12/15  1224 History   First MD Initiated Contact with Patient 09/12/15 1249     Chief Complaint  Patient presents with  . Weakness     (Consider location/radiation/quality/duration/timing/severity/associated sxs/prior Treatment) HPI  Family thought she looked pale, confused, couldn't remember how to get out of the chair, fatigued, more than usual.  Seemed to be more fatigued than usual.  Was very pale, did orthostatic with home BP was 128/73 when sitting when standing was 100/71. Was figidity and had difficulty standing up while BP was taken.  Fatigue today making it difficult to participate in PT. Getting headaches, unclear severity. Gave some tylenol. No chest pain.  (had them weeks ago) Just got out of [facility]. Frequent UTIs.  Waxing and waning mental status.  No new focal weakness. No new medications to contribute to symptoms. Takes ativan which is new but last took last night.    No falls (did one month ago),      Past Medical History  Diagnosis Date  . Arthritis   . Hypertension   . Migraine   . Colon polyp   . Hearing difficulty   . Torus palatinus   . Atrial flutter ([place]) January, 2012  . Stroke ([place]) 08/02/12     right lenticular nucleus infarct  . Left leg weakness 02/05/2013  . Chronic anticoagulation   . High cholesterol   . Atrial fibrillation ([place])     on Eliquis Rx  . Mild cognitive impairment with memory loss   . Malnutrition ([place])   . TIA (transient ischemic attack)   . Constipation   . Osteoporosis   . Hemorrhoids   . Left-sided weakness   . Acute encephalopathy   . HLD (hyperlipidemia)   . Acute cystitis without hematuria   . A-fib [facility])    Past Surgical History  Procedure Laterality Date  . Cataract extraction w/ intraocular lens  implant, bilateral  2006-2008  . Ganglion cyst excision Bilateral 1938,1954,2003,2005    "wrists/hand" (08/01/2012)  . Cardioversion  05/19/2010    Dr. Einar Gip  . Tonsillectomy   ~ 1935  . Appendectomy  02/19/53    `  . Mouth surgery      [name]   Family History  Problem Relation Age of Onset  . Colon cancer Father   . Breast cancer    . Brain cancer    . Cancer Mother     breast  . Aneurysm Mother     brain  . Heart attack Neg Hx   . Diabetes Neg Hx   . Hypertension Neg Hx   . Stroke Mother    Social History  Substance Use Topics  . Smoking status: Former Research scientist (life sciences)  . Smokeless tobacco: Never Used  . Alcohol Use: No     Comment: 08/01/2012 "glass of wine on special occasions"   OB History    No data available     Review of Systems  Constitutional: Positive for fatigue. Negative for fever.  HENT: Negative for sore throat.   Eyes: Negative for visual disturbance.  Respiratory: Negative for cough and shortness of breath.   Cardiovascular: Negative for chest pain.  Gastrointestinal: Negative for nausea, vomiting, abdominal pain, diarrhea and constipation.  Genitourinary: Negative for difficulty urinating.  Musculoskeletal: Negative for back pain and neck pain.  Skin: Negative for rash.  Neurological: Positive for headaches. Negative for syncope.      Allergies  Aricept; Tramadol hcl; and Alendronate sodium  Home Medications   Prior to Admission medications   Medication Sig Start Date End Date Taking? Authorizing Provider  acetaminophen (TYLENOL) 325 MG tablet Take 2 tablets (650 mg total) by mouth every 4 (four) hours as needed for mild pain (or temp >/= 99.5 F). 08/12/15   Lavina Hamman, MD  amLODipine (NORVASC) 2.5 MG tablet Take 2.5 mg by mouth daily.    Historical Provider, MD  b complex vitamins tablet Take 1 tablet by mouth 2 (two) times daily.     Historical Provider, MD  Calcium Carb-Cholecalciferol (CALCIUM 600 + D) 600-200 MG-UNIT TABS Take 1 tablet by mouth 2 (two) times daily.    Historical Provider, MD  cephALEXin (KEFLEX) 500 MG capsule Take 1 capsule (500 mg total) by mouth 3 (three) times daily. 09/12/15 09/22/15  Gareth Morgan,  MD  cetirizine (ZYRTEC) 10 MG tablet Take 10 mg by mouth at bedtime.    Historical Provider, MD  diphenhydramine-acetaminophen (TYLENOL PM) 25-500 MG TABS Take 2 tablets by mouth at bedtime as needed (sleep).     Historical Provider, MD  divalproex (DEPAKOTE) 125 MG DR tablet Take 2 tablets (250 mg total) by mouth 2 (two) times daily. 07/25/14   Garvin Fila, MD  Docusate Sodium (COLACE PO) Take 100 mg by mouth 2 (two) times daily.     Historical Provider, MD  ELIQUIS 2.5 MG TABS tablet TAKE 1 TABLET BY MOUTH TWICE DAILY 03/25/15   Garvin Fila, MD  Ergocalciferol (VITAMIN D2) 2000 units TABS Take 1 tablet by mouth daily.    Historical Provider, MD  famotidine (PEPCID) 20 MG tablet Take 20 mg by mouth 2 (two) times daily.    Historical Provider, MD  hydrocortisone (ANUSOL-HC) 2.5 % rectal cream Place 1 application rectally as needed for hemorrhoids or itching.    Historical Provider, MD  hydrocortisone (ANUSOL-HC) 25 MG suppository Place 25 mg rectally as needed for hemorrhoids or itching.    Historical Provider, MD  LORazepam (ATIVAN) 0.5 MG tablet Take 0.5 mg by mouth every 8 (eight) hours as needed for anxiety. Give 1/2 tablet BID PRN    Historical Provider, MD  LORazepam (ATIVAN) 0.5 MG tablet 1 tablet by mouth daily at 4 PM scheduled, take 1/2 tablet by mouth twice daily as needed for anxiety 09/05/15   Gildardo Cranker, DO  Melatonin 3 MG TABS Take 1 tablet by mouth at bedtime    Historical Provider, MD  mirabegron ER (MYRBETRIQ) 25 MG TB24 tablet Take 1 tablet (25 mg total) by mouth daily. 08/06/15   Gildardo Cranker, DO  Multiple Vitamin (MULTIVITAMIN WITH MINERALS) TABS Take 1 tablet by mouth daily.    Historical Provider, MD  olopatadine (PATANOL) 0.1 % ophthalmic solution Place 1 drop into both eyes 2 (two) times daily. 06/11/15   Monica Carter, DO  ondansetron (ZOFRAN) 4 MG tablet Take 1 tablet (4 mg total) by mouth every 6 (six) hours as needed for nausea. 10/31/14   Delfina Redwood, MD   Protein (PROCEL 100) POWD Take 1 scoop by mouth 2 (two) times daily.    Historical Provider, MD  senna-docusate (SENOKOT-S) 8.6-50 MG tablet Take 1 tablet by mouth at bedtime as needed for mild constipation. 08/12/15   Lavina Hamman, MD   BP 119/78 mmHg  Pulse 72  Temp(Src) 98.5 F (36.9 C) (Oral)  Resp 13  Ht 5\' 6"  (1.676 m)  Wt 127 lb (57.607 kg)  BMI 20.51 kg/m2  SpO2 97% Physical Exam  Constitutional: She  appears well-developed and well-nourished. No distress.  HENT:  Head: Normocephalic and atraumatic.  Eyes: Conjunctivae and EOM are normal.  Neck: Normal range of motion.  Cardiovascular: Normal rate, regular rhythm, normal heart sounds and intact distal pulses.  Exam reveals no gallop and no friction rub.   No murmur heard. Pulmonary/Chest: Effort normal and breath sounds normal. No respiratory distress. She has no wheezes. She has no rales.  Abdominal: Soft. She exhibits no distension. There is no tenderness. There is no guarding.  Musculoskeletal: She exhibits no edema or tenderness.  Neurological: She is alert. No cranial nerve deficit (with exception of very mild facial weakness on left) or sensory deficit. GCS eye subscore is 4. GCS verbal subscore is 5. GCS motor subscore is 6.  Oriented to self and place Weakness 4/5 LUE (baseline), mild left facial weakness  Skin: Skin is warm and dry. No rash noted. She is not diaphoretic. No erythema.  Nursing note and vitals reviewed.   ED Course  Procedures (including critical care time) Labs Review Labs Reviewed  COMPREHENSIVE METABOLIC PANEL - Abnormal; Notable for the following:    Total Protein 6.4 (*)    Albumin 3.4 (*)    ALT 11 (*)    All other components within normal limits  URINALYSIS, ROUTINE W REFLEX MICROSCOPIC (NOT AT [facility]) - Abnormal; Notable for the following:    Leukocytes, UA MODERATE (*)    All other components within normal limits  URINE MICROSCOPIC-ADD ON - Abnormal; Notable for the following:     Squamous Epithelial / LPF 0-5 (*)    Bacteria, UA MANY (*)    All other components within normal limits  CULTURE, BLOOD (ROUTINE X 2)  URINE CULTURE  CBC WITH DIFFERENTIAL/PLATELET  TROPONIN I  MAGNESIUM  I-STAT CG4 LACTIC ACID, ED    Imaging Review Dg Chest 2 View  09/12/2015  CLINICAL DATA:  Weakness.  Confusion and possible sepsis. EXAM: CHEST  2 VIEW COMPARISON:  07/31/2015 FINDINGS: Few hazy densities at the left costophrenic angle appear stable. Otherwise, the lungs are clear. Heart and mediastinum are within normal limits. No pleural effusions. Mild degenerative changes in the thoracic spine. IMPRESSION: No active cardiopulmonary disease. Electronically Signed   By: Markus Daft M.D.   On: 09/12/2015 13:55   Ct Head Wo Contrast  09/12/2015  CLINICAL DATA:  Left-sided weakness and headache.  Fatigue. EXAM: CT HEAD WITHOUT CONTRAST TECHNIQUE: Contiguous axial images were obtained from the base of the skull through the vertex without intravenous contrast. COMPARISON:  Head CT Aug 07, 2015 and brain MRI Aug 07, 2015 FINDINGS: Moderate diffuse atrophy is stable. There is no intracranial mass, hemorrhage, extra-axial fluid collection, or midline shift. There is small vessel disease throughout the centra semiovale bilaterally, stable. There is a prior infarct in the left mid parietal lobe, stable. There is a small lacunar infarct at the junction of the inferior right centrum semiovale and right lentiform nucleus, stable. There is a prior small lacunar infarct in the superior medial left cerebellum. No new gray-white compartment lesion is identified. No acute infarct is evident. The bony calvarium appears intact. The mastoid air cells are clear. No intraorbital lesions are appreciable. There is extensive arthropathy in the left temporomandibular joint. IMPRESSION: Moderate diffuse atrophy with periventricular small vessel disease as well as prior infarcts as summarized above. No acute infarct is  evident. No hemorrhage or mass effect. Extensive left temporomandibular joint arthropathic change. Electronically Signed   By: Lowella Grip III M.D.  On: 09/12/2015 15:16   I have personally reviewed and evaluated these images and lab results as part of my medical decision-making.   EKG Interpretation   Date/Time:  Friday September 12 2015 13:07:20 EDT Ventricular Rate:  62 PR Interval:  173 QRS Duration: 97 QT Interval:  428 QTC Calculation: 435 R Axis:   3 Text Interpretation:  Sinus rhythm Atrial fibrillation is no longer  present No other significant changes Confirmed by [facility] MD, [place]  ([id number]) on 09/12/2015 2:06:42 PM      MDM   Final diagnoses:  Generalized weakness  Urinary tract infection without hematuria, site unspecified   82yo female with history of hypertension, atrial flutter/fibrillation on eliquis, CVA with residual left sided weakness, dementia, hyperlipidemia, who left rehab facility 3 days ago after being sent for urinary tract infection with weakness and rehab, presents with concern for generalized weakness, fatigue. Patient with normal vital signs. Neuro exam at baseline. Labs WNL No hx to suggest GI bleed. CT head without acute changes. No sign of sepsis. Urinalysis is concerning for UTI.  Patient stable for outpt follow up and treatment of UTI with keflex.  Patient discharged in stable condition with understanding of reasons to return.     Gareth Morgan, MD 09/12/15 2116

## 2015-09-12 NOTE — ED Notes (Signed)
Patient transported to X-ray 

## 2015-09-12 NOTE — Telephone Encounter (Signed)
Daughter called and stated that patient is Extremely Fatigued, pale and very tired. Daughter did an orthostatic BP on her sitting 121/73, standing 100/71. States she is not acting right, didn't even remember how to get out of the chair she was sitting in. Daughter feels like something is just off. Patient was just recently discharged from [facility] and had an appointment with you in July.  I advised daughter to take patient to the Urgent Care to be evaluated. Daughter agreed and stated that she was going to take her to the [facility].

## 2015-09-13 ENCOUNTER — Emergency Department (HOSPITAL_BASED_OUTPATIENT_CLINIC_OR_DEPARTMENT_OTHER)
Admission: EM | Admit: 2015-09-13 | Discharge: 2015-09-13 | Disposition: A | Discharge status: Home / Self Care | Payer: Medicare Other | Attending: Emergency Medicine | Admitting: Emergency Medicine

## 2015-09-13 ENCOUNTER — Encounter (HOSPITAL_BASED_OUTPATIENT_CLINIC_OR_DEPARTMENT_OTHER): Payer: Self-pay

## 2015-09-13 ENCOUNTER — Telehealth (HOSPITAL_BASED_OUTPATIENT_CLINIC_OR_DEPARTMENT_OTHER): Payer: Self-pay

## 2015-09-13 DIAGNOSIS — I4891 Unspecified atrial fibrillation: Secondary | ICD-10-CM | POA: Insufficient documentation

## 2015-09-13 DIAGNOSIS — E7800 Pure hypercholesterolemia, unspecified: Secondary | ICD-10-CM | POA: Insufficient documentation

## 2015-09-13 DIAGNOSIS — M1990 Unspecified osteoarthritis, unspecified site: Secondary | ICD-10-CM | POA: Insufficient documentation

## 2015-09-13 DIAGNOSIS — Z87891 Personal history of nicotine dependence: Secondary | ICD-10-CM | POA: Diagnosis not present

## 2015-09-13 DIAGNOSIS — R899 Unspecified abnormal finding in specimens from other organs, systems and tissues: Secondary | ICD-10-CM

## 2015-09-13 DIAGNOSIS — R799 Abnormal finding of blood chemistry, unspecified: Secondary | ICD-10-CM | POA: Diagnosis not present

## 2015-09-13 DIAGNOSIS — Z79899 Other long term (current) drug therapy: Secondary | ICD-10-CM | POA: Diagnosis not present

## 2015-09-13 DIAGNOSIS — Z8673 Personal history of transient ischemic attack (TIA), and cerebral infarction without residual deficits: Secondary | ICD-10-CM | POA: Insufficient documentation

## 2015-09-13 DIAGNOSIS — R7989 Other specified abnormal findings of blood chemistry: Secondary | ICD-10-CM | POA: Diagnosis not present

## 2015-09-13 DIAGNOSIS — R531 Weakness: Secondary | ICD-10-CM | POA: Diagnosis not present

## 2015-09-13 DIAGNOSIS — I10 Essential (primary) hypertension: Secondary | ICD-10-CM | POA: Insufficient documentation

## 2015-09-13 LAB — BLOOD CULTURE ID PANEL (REFLEXED)
ACINETOBACTER BAUMANNII: NOT DETECTED
CANDIDA ALBICANS: NOT DETECTED
CANDIDA PARAPSILOSIS: NOT DETECTED
CANDIDA TROPICALIS: NOT DETECTED
Candida glabrata: NOT DETECTED
Candida krusei: NOT DETECTED
Carbapenem resistance: NOT DETECTED
ESCHERICHIA COLI: NOT DETECTED
Enterobacter cloacae complex: NOT DETECTED
Enterobacteriaceae species: NOT DETECTED
Enterococcus species: NOT DETECTED
HAEMOPHILUS INFLUENZAE: NOT DETECTED
KLEBSIELLA OXYTOCA: NOT DETECTED
KLEBSIELLA PNEUMONIAE: NOT DETECTED
Listeria monocytogenes: NOT DETECTED
METHICILLIN RESISTANCE: NOT DETECTED
Neisseria meningitidis: NOT DETECTED
PSEUDOMONAS AERUGINOSA: NOT DETECTED
Proteus species: NOT DETECTED
SERRATIA MARCESCENS: NOT DETECTED
STAPHYLOCOCCUS AUREUS BCID: NOT DETECTED
STAPHYLOCOCCUS SPECIES: DETECTED — AB
STREPTOCOCCUS PNEUMONIAE: NOT DETECTED
Streptococcus agalactiae: NOT DETECTED
Streptococcus pyogenes: NOT DETECTED
Streptococcus species: NOT DETECTED
Vancomycin resistance: NOT DETECTED

## 2015-09-13 LAB — CBC WITH DIFFERENTIAL/PLATELET
BASOS ABS: 0 10*3/uL (ref 0.0–0.1)
Basophils Relative: 0 %
EOS PCT: 2 %
Eosinophils Absolute: 0.1 10*3/uL (ref 0.0–0.7)
HEMATOCRIT: 37.9 % (ref 36.0–46.0)
Hemoglobin: 12.6 g/dL (ref 12.0–15.0)
LYMPHS ABS: 1.5 10*3/uL (ref 0.7–4.0)
LYMPHS PCT: 28 %
MCH: 31.3 pg (ref 26.0–34.0)
MCHC: 33.2 g/dL (ref 30.0–36.0)
MCV: 94.3 fL (ref 78.0–100.0)
MONO ABS: 0.9 10*3/uL (ref 0.1–1.0)
MONOS PCT: 17 %
NEUTROS ABS: 2.8 10*3/uL (ref 1.7–7.7)
Neutrophils Relative %: 53 %
PLATELETS: 177 10*3/uL (ref 150–400)
RBC: 4.02 MIL/uL (ref 3.87–5.11)
RDW: 13.6 % (ref 11.5–15.5)
WBC: 5.2 10*3/uL (ref 4.0–10.5)

## 2015-09-13 LAB — I-STAT CG4 LACTIC ACID, ED: Lactic Acid, Venous: 1.08 mmol/L (ref 0.5–2.0)

## 2015-09-13 LAB — BASIC METABOLIC PANEL
ANION GAP: 8 (ref 5–15)
BUN: 13 mg/dL (ref 6–20)
CALCIUM: 9.3 mg/dL (ref 8.9–10.3)
CO2: 27 mmol/L (ref 22–32)
CREATININE: 0.7 mg/dL (ref 0.44–1.00)
Chloride: 107 mmol/L (ref 101–111)
GFR calc Af Amer: >60 mL/min (ref >60)
GLUCOSE: 97 mg/dL (ref 65–99)
Potassium: 4.2 mmol/L (ref 3.5–5.1)
Sodium: 142 mmol/L (ref 135–145)

## 2015-09-13 MED ORDER — VANCOMYCIN HCL IN DEXTROSE 1-5 GM/200ML-% IV SOLN
1000.0000 mg | Freq: Once | INTRAVENOUS | Status: AC
  Administered 2015-09-13: 1000 mg via INTRAVENOUS
  Filled 2015-09-13: qty 200

## 2015-09-13 MED ORDER — SODIUM CHLORIDE 0.9 % IV SOLN
INTRAVENOUS | Status: DC
  Administered 2015-09-13: 17:00:00 via INTRAVENOUS

## 2015-09-13 MED ORDER — PIPERACILLIN-TAZOBACTAM 3.375 G IVPB 30 MIN
3.3750 g | Freq: Once | INTRAVENOUS | Status: AC
  Administered 2015-09-13: 3.375 g via INTRAVENOUS
  Filled 2015-09-13 (×2): qty 50

## 2015-09-13 NOTE — ED Notes (Signed)
Updated on delay of care, pillow provided for comfort. Family educated regarding reason for delay and process. Family verbalized understanding

## 2015-09-13 NOTE — ED Provider Notes (Signed)
CSN: [id number]     Arrival date & time 09/13/15  1233 History  By signing my name below, I, Altamease Oiler, attest that this documentation has been prepared under the direction and in the presence of Dorie Rank, MD. Electronically Signed: Altamease Oiler, ED Scribe. 09/13/2015. 5:05 PM    Chief Complaint  Patient presents with  . Abnormal Lab   The history is provided by the patient and a relative.   [name] is a 82 y.o. female with PMHx of of dementia, hypertension, atrial flutter/fibrillation on eliquis, CVA with residual left sided weakness, hyperlipidemia, and frequent UTIs who presents to the Emergency Department for evaluation after having a positive blood culture yesterday. The pt was seen for generalized weakness yesterday and diagnosed with a UTI. She has had 2 doses of the abx that she was prescribed yesterday. Today she and her family received a call that they should come back to the ED for evaluation because her blood cultures were positive. Since yesterday the pt has continued to be weak and pale. Associated symptoms include decreased appetite.  Family notes that yesterday she was also having periodic confusion but that seems to have improved. Pt denies fever, vomiting, and nausea.   Past Medical History  Diagnosis Date  . Arthritis   . Hypertension   . Migraine   . Colon polyp   . Hearing difficulty   . Torus palatinus   . Atrial flutter ([place]) January, 2012  . Stroke ([place]) 08/02/12     right lenticular nucleus infarct  . Left leg weakness 02/05/2013  . Chronic anticoagulation   . High cholesterol   . Atrial fibrillation ([place])     on Eliquis Rx  . Mild cognitive impairment with memory loss   . Malnutrition ([place])   . TIA (transient ischemic attack)   . Constipation   . Osteoporosis   . Hemorrhoids   . Left-sided weakness   . Acute encephalopathy   . HLD (hyperlipidemia)   . Acute cystitis without hematuria   . A-fib [facility])    Past Surgical History  Procedure  Laterality Date  . Cataract extraction w/ intraocular lens  implant, bilateral  2006-2008  . Ganglion cyst excision Bilateral 1938,1954,2003,2005    "wrists/hand" (08/01/2012)  . Cardioversion  05/19/2010    Dr. Einar Gip  . Tonsillectomy  ~ 1935  . Appendectomy  02/19/53    `  . Mouth surgery      [name]   Family History  Problem Relation Age of Onset  . Colon cancer Father   . Breast cancer    . Brain cancer    . Cancer Mother     breast  . Aneurysm Mother     brain  . Heart attack Neg Hx   . Diabetes Neg Hx   . Hypertension Neg Hx   . Stroke Mother    Social History  Substance Use Topics  . Smoking status: Former Research scientist (life sciences)  . Smokeless tobacco: Never Used  . Alcohol Use: No     Comment: 08/01/2012 "glass of wine on special occasions"   OB History    No data available     Review of Systems  Constitutional: Positive for appetite change. Negative for fever.  Gastrointestinal: Negative for nausea and vomiting.  Skin: Positive for pallor.  Neurological: Positive for weakness.  Psychiatric/Behavioral: Positive for confusion.  All other systems reviewed and are negative.  Allergies  Aricept; Tramadol hcl; and Alendronate sodium  Home Medications   Prior to  Admission medications   Medication Sig Start Date End Date Taking? Authorizing Provider  acetaminophen (TYLENOL) 325 MG tablet Take 2 tablets (650 mg total) by mouth every 4 (four) hours as needed for mild pain (or temp >/= 99.5 F). 08/12/15   Lavina Hamman, MD  amLODipine (NORVASC) 2.5 MG tablet Take 2.5 mg by mouth daily.    Historical Provider, MD  b complex vitamins tablet Take 1 tablet by mouth 2 (two) times daily.     Historical Provider, MD  Calcium Carb-Cholecalciferol (CALCIUM 600 + D) 600-200 MG-UNIT TABS Take 1 tablet by mouth 2 (two) times daily.    Historical Provider, MD  cephALEXin (KEFLEX) 500 MG capsule Take 1 capsule (500 mg total) by mouth 3 (three) times daily. 09/12/15 09/22/15  Gareth Morgan, MD   cetirizine (ZYRTEC) 10 MG tablet Take 10 mg by mouth at bedtime.    Historical Provider, MD  diphenhydramine-acetaminophen (TYLENOL PM) 25-500 MG TABS Take 2 tablets by mouth at bedtime as needed (sleep).     Historical Provider, MD  divalproex (DEPAKOTE) 125 MG DR tablet Take 2 tablets (250 mg total) by mouth 2 (two) times daily. 07/25/14   Garvin Fila, MD  Docusate Sodium (COLACE PO) Take 100 mg by mouth 2 (two) times daily.     Historical Provider, MD  ELIQUIS 2.5 MG TABS tablet TAKE 1 TABLET BY MOUTH TWICE DAILY 03/25/15   Garvin Fila, MD  Ergocalciferol (VITAMIN D2) 2000 units TABS Take 1 tablet by mouth daily.    Historical Provider, MD  famotidine (PEPCID) 20 MG tablet Take 20 mg by mouth 2 (two) times daily.    Historical Provider, MD  hydrocortisone (ANUSOL-HC) 2.5 % rectal cream Place 1 application rectally as needed for hemorrhoids or itching.    Historical Provider, MD  hydrocortisone (ANUSOL-HC) 25 MG suppository Place 25 mg rectally as needed for hemorrhoids or itching.    Historical Provider, MD  LORazepam (ATIVAN) 0.5 MG tablet Take 0.5 mg by mouth every 8 (eight) hours as needed for anxiety. Give 1/2 tablet BID PRN    Historical Provider, MD  LORazepam (ATIVAN) 0.5 MG tablet 1 tablet by mouth daily at 4 PM scheduled, take 1/2 tablet by mouth twice daily as needed for anxiety 09/05/15   Gildardo Cranker, DO  Melatonin 3 MG TABS Take 1 tablet by mouth at bedtime    Historical Provider, MD  mirabegron ER (MYRBETRIQ) 25 MG TB24 tablet Take 1 tablet (25 mg total) by mouth daily. 08/06/15   Gildardo Cranker, DO  Multiple Vitamin (MULTIVITAMIN WITH MINERALS) TABS Take 1 tablet by mouth daily.    Historical Provider, MD  olopatadine (PATANOL) 0.1 % ophthalmic solution Place 1 drop into both eyes 2 (two) times daily. 06/11/15   Monica Carter, DO  ondansetron (ZOFRAN) 4 MG tablet Take 1 tablet (4 mg total) by mouth every 6 (six) hours as needed for nausea. 10/31/14   Delfina Redwood, MD  Protein  (PROCEL 100) POWD Take 1 scoop by mouth 2 (two) times daily.    Historical Provider, MD  senna-docusate (SENOKOT-S) 8.6-50 MG tablet Take 1 tablet by mouth at bedtime as needed for mild constipation. 08/12/15   Lavina Hamman, MD   BP 165/71 mmHg  Pulse 77  Temp(Src) 98.1 F (36.7 C) (Oral)  Resp 18  Ht 5\' 6"  (1.676 m)  Wt 127 lb (57.607 kg)  BMI 20.51 kg/m2  SpO2 98% Physical Exam  Constitutional: No distress.  Frail and elderly  HENT:  Head: Normocephalic and atraumatic.  Right Ear: External ear normal.  Left Ear: External ear normal.  Eyes: Conjunctivae are normal. Right eye exhibits no discharge. Left eye exhibits no discharge. No scleral icterus.  Neck: Neck supple. No tracheal deviation present.  Cardiovascular: Normal rate, regular rhythm and intact distal pulses.   Pulmonary/Chest: Effort normal and breath sounds normal. No stridor. No respiratory distress. She has no wheezes. She has no rales.  Abdominal: Soft. Bowel sounds are normal. She exhibits no distension. There is no tenderness. There is no rebound and no guarding.  Musculoskeletal: She exhibits no edema or tenderness.  Strong peripheral pulses   Neurological: She is alert. No cranial nerve deficit (no facial droop, extraocular movements intact, no slurred speech) or sensory deficit. She exhibits normal muscle tone. She displays no seizure activity. Coordination normal.  Generalized weakness, no focal deficit   Skin: Skin is warm and dry. No rash noted.  Psychiatric: She has a normal mood and affect.  Nursing note and vitals reviewed.   ED Course  Procedures (including critical care time) DIAGNOSTIC STUDIES: Oxygen Saturation is 98% on RA,  normal by my interpretation.    COORDINATION OF CARE: 4:24 PM Discussed treatment plan which includes lab work, abx, and IVF with pt at bedside and pt agreed to plan.  Medications  0.9 %  sodium chloride infusion ( Intravenous New Bag/Given 09/13/15 1648)   piperacillin-tazobactam (ZOSYN) IVPB 3.375 g (0 g Intravenous Stopped 09/13/15 1730)  vancomycin (VANCOCIN) IVPB 1000 mg/200 mL premix (0 mg Intravenous Stopped 09/13/15 1803)    Labs Review Labs Reviewed  CULTURE, BLOOD (ROUTINE X 2)  CULTURE, BLOOD (ROUTINE X 2)  CBC WITH DIFFERENTIAL/PLATELET  BASIC METABOLIC PANEL  I-STAT CG4 LACTIC ACID, ED    Imaging Review Dg Chest 2 View  09/12/2015  CLINICAL DATA:  Weakness.  Confusion and possible sepsis. EXAM: CHEST  2 VIEW COMPARISON:  07/31/2015 FINDINGS: Few hazy densities at the left costophrenic angle appear stable. Otherwise, the lungs are clear. Heart and mediastinum are within normal limits. No pleural effusions. Mild degenerative changes in the thoracic spine. IMPRESSION: No active cardiopulmonary disease. Electronically Signed   By: Markus Daft M.D.   On: 09/12/2015 13:55   Ct Head Wo Contrast  09/12/2015  CLINICAL DATA:  Left-sided weakness and headache.  Fatigue. EXAM: CT HEAD WITHOUT CONTRAST TECHNIQUE: Contiguous axial images were obtained from the base of the skull through the vertex without intravenous contrast. COMPARISON:  Head CT Aug 07, 2015 and brain MRI Aug 07, 2015 FINDINGS: Moderate diffuse atrophy is stable. There is no intracranial mass, hemorrhage, extra-axial fluid collection, or midline shift. There is small vessel disease throughout the centra semiovale bilaterally, stable. There is a prior infarct in the left mid parietal lobe, stable. There is a small lacunar infarct at the junction of the inferior right centrum semiovale and right lentiform nucleus, stable. There is a prior small lacunar infarct in the superior medial left cerebellum. No new gray-white compartment lesion is identified. No acute infarct is evident. The bony calvarium appears intact. The mastoid air cells are clear. No intraorbital lesions are appreciable. There is extensive arthropathy in the left temporomandibular joint. IMPRESSION: Moderate diffuse  atrophy with periventricular small vessel disease as well as prior infarcts as summarized above. No acute infarct is evident. No hemorrhage or mass effect. Extensive left temporomandibular joint arthropathic change. Electronically Signed   By: Lowella Grip III M.D.   On: 09/12/2015 15:16   I have  personally reviewed and evaluated these lab results as part of my medical decision-making.    MDM   Final diagnoses:  Abnormal laboratory test   Patient was sent back to the emergency room for a positive blood culture. I believe she only had one set of cultures yesterday. It was positive for gram-positive cocci consistent with Staphylococcus.  The specific organism is not identified yet. I suspect this could be a contaminant. Patient's laboratory tests are still reassuring. I repeated her blood cultures. I discussed the options of having the patient admitted to the hospital for observation versus taking her home with close follow-up. The patient would prefer to go home. The family is comfortable with watching her at home. Family will call back to the ED tomorrow to tell us how she is doing and we will recheck to see if there is any additional information from her cultures.  I personally performed the services described in this documentation, which was scribed in my presence.  The recorded information has been reviewed and is accurate.    Dorie Rank, MD 09/13/15 Tresa Moore

## 2015-09-13 NOTE — ED Notes (Signed)
Pt assisted to bsc via 1 assist, urine sample sent to lab, family at bedside

## 2015-09-13 NOTE — ED Notes (Signed)
Family reports that pt was seen here yesterday. Sts that pt was called back in due to having bacteria in the bloodwork. Pt has no complaints at this time. Family reports pt was here yesterday for generalized weakness.

## 2015-09-13 NOTE — ED Notes (Signed)
Pt assisted with BSC. Tolerated well.

## 2015-09-13 NOTE — ED Notes (Signed)
MD at bedside. 

## 2015-09-13 NOTE — Telephone Encounter (Signed)
Patient with + blood culture in aerobic bottle  Gram + cocci in clusters called to Dr. Billy Fischer. Patients daughter called and given results . Also instructed to return to ED for repeat culture and possible adm to hospital. Verbalized understanding.

## 2015-09-14 LAB — URINE CULTURE

## 2015-09-15 ENCOUNTER — Telehealth: Payer: Self-pay | Admitting: *Deleted

## 2015-09-15 DIAGNOSIS — R1312 Dysphagia, oropharyngeal phase: Secondary | ICD-10-CM | POA: Diagnosis not present

## 2015-09-15 DIAGNOSIS — I4892 Unspecified atrial flutter: Secondary | ICD-10-CM | POA: Diagnosis not present

## 2015-09-15 DIAGNOSIS — G3184 Mild cognitive impairment, so stated: Secondary | ICD-10-CM | POA: Diagnosis not present

## 2015-09-15 DIAGNOSIS — I4891 Unspecified atrial fibrillation: Secondary | ICD-10-CM | POA: Diagnosis not present

## 2015-09-15 DIAGNOSIS — I10 Essential (primary) hypertension: Secondary | ICD-10-CM | POA: Diagnosis not present

## 2015-09-15 DIAGNOSIS — I69354 Hemiplegia and hemiparesis following cerebral infarction affecting left non-dominant side: Secondary | ICD-10-CM | POA: Diagnosis not present

## 2015-09-15 LAB — CULTURE, BLOOD (ROUTINE X 2)

## 2015-09-15 NOTE — Telephone Encounter (Signed)
Caregiver called with an update from the ER and stated she went in on Friday. Dx with UTI and given antibiotics and found bacteria in blood. Took back to ER and it was a false positive. Came out of [facility] last week and has OT and PT. Her next appointment with you is July 14. Caregiver just wanted to make sure this is ok. Please Advise.

## 2015-09-15 NOTE — Telephone Encounter (Signed)
Patient caregiver aware and agreed.

## 2015-09-15 NOTE — Telephone Encounter (Signed)
Noted. Keep scheduled appt

## 2015-09-16 DIAGNOSIS — I4891 Unspecified atrial fibrillation: Secondary | ICD-10-CM | POA: Diagnosis not present

## 2015-09-16 DIAGNOSIS — G3184 Mild cognitive impairment, so stated: Secondary | ICD-10-CM | POA: Diagnosis not present

## 2015-09-16 DIAGNOSIS — I69354 Hemiplegia and hemiparesis following cerebral infarction affecting left non-dominant side: Secondary | ICD-10-CM | POA: Diagnosis not present

## 2015-09-16 DIAGNOSIS — I4892 Unspecified atrial flutter: Secondary | ICD-10-CM | POA: Diagnosis not present

## 2015-09-16 DIAGNOSIS — R1312 Dysphagia, oropharyngeal phase: Secondary | ICD-10-CM | POA: Diagnosis not present

## 2015-09-16 DIAGNOSIS — I10 Essential (primary) hypertension: Secondary | ICD-10-CM | POA: Diagnosis not present

## 2015-09-16 NOTE — Telephone Encounter (Signed)
She can finish keflex tx and f/u as scheduled

## 2015-09-16 NOTE — Telephone Encounter (Signed)
Patient daughter, Henrine Screws called back and stated that she forgot to ask yesterday if they should still give the Keflex to the patient. Stated that the Keflex was given to them before they left the hospital on Friday and then they were called to come back in on Saturday for the blood bacteria and given 2 rounds of IV antibiotics. They have been giving the Keflex 500mg  to the patient up to this point but started wondering if she is getting too much. Taking 3 tablets daily for 10 days. Please Advise.

## 2015-09-17 DIAGNOSIS — G3184 Mild cognitive impairment, so stated: Secondary | ICD-10-CM | POA: Diagnosis not present

## 2015-09-17 DIAGNOSIS — R1312 Dysphagia, oropharyngeal phase: Secondary | ICD-10-CM | POA: Diagnosis not present

## 2015-09-17 DIAGNOSIS — I10 Essential (primary) hypertension: Secondary | ICD-10-CM | POA: Diagnosis not present

## 2015-09-17 DIAGNOSIS — I4891 Unspecified atrial fibrillation: Secondary | ICD-10-CM | POA: Diagnosis not present

## 2015-09-17 DIAGNOSIS — I4892 Unspecified atrial flutter: Secondary | ICD-10-CM | POA: Diagnosis not present

## 2015-09-17 DIAGNOSIS — I69354 Hemiplegia and hemiparesis following cerebral infarction affecting left non-dominant side: Secondary | ICD-10-CM | POA: Diagnosis not present

## 2015-09-17 NOTE — Telephone Encounter (Signed)
Patient caregiver notified and agreed.

## 2015-09-18 LAB — CULTURE, BLOOD (ROUTINE X 2)
Culture: NO GROWTH
Culture: NO GROWTH

## 2015-09-19 DIAGNOSIS — R1312 Dysphagia, oropharyngeal phase: Secondary | ICD-10-CM | POA: Diagnosis not present

## 2015-09-19 DIAGNOSIS — I4892 Unspecified atrial flutter: Secondary | ICD-10-CM | POA: Diagnosis not present

## 2015-09-19 DIAGNOSIS — G3184 Mild cognitive impairment, so stated: Secondary | ICD-10-CM | POA: Diagnosis not present

## 2015-09-19 DIAGNOSIS — I69354 Hemiplegia and hemiparesis following cerebral infarction affecting left non-dominant side: Secondary | ICD-10-CM | POA: Diagnosis not present

## 2015-09-19 DIAGNOSIS — I10 Essential (primary) hypertension: Secondary | ICD-10-CM | POA: Diagnosis not present

## 2015-09-19 DIAGNOSIS — I4891 Unspecified atrial fibrillation: Secondary | ICD-10-CM | POA: Diagnosis not present

## 2015-09-19 MED FILL — ELIQUIS 2.5 MG TABLET: 2.5 | 60 days supply | Qty: 120 | Fill #2

## 2015-09-19 MED FILL — DIVALPROEX SOD DR 125 MG TA: 125 | 90 days supply | Qty: 360 | Fill #1

## 2015-09-22 DIAGNOSIS — I4891 Unspecified atrial fibrillation: Secondary | ICD-10-CM | POA: Diagnosis not present

## 2015-09-22 DIAGNOSIS — I10 Essential (primary) hypertension: Secondary | ICD-10-CM | POA: Diagnosis not present

## 2015-09-22 DIAGNOSIS — I4892 Unspecified atrial flutter: Secondary | ICD-10-CM | POA: Diagnosis not present

## 2015-09-22 DIAGNOSIS — R1312 Dysphagia, oropharyngeal phase: Secondary | ICD-10-CM | POA: Diagnosis not present

## 2015-09-22 DIAGNOSIS — I69354 Hemiplegia and hemiparesis following cerebral infarction affecting left non-dominant side: Secondary | ICD-10-CM | POA: Diagnosis not present

## 2015-09-22 DIAGNOSIS — G3184 Mild cognitive impairment, so stated: Secondary | ICD-10-CM | POA: Diagnosis not present

## 2015-09-23 DIAGNOSIS — I10 Essential (primary) hypertension: Secondary | ICD-10-CM | POA: Diagnosis not present

## 2015-09-23 DIAGNOSIS — R1312 Dysphagia, oropharyngeal phase: Secondary | ICD-10-CM | POA: Diagnosis not present

## 2015-09-23 DIAGNOSIS — G3184 Mild cognitive impairment, so stated: Secondary | ICD-10-CM | POA: Diagnosis not present

## 2015-09-23 DIAGNOSIS — I69354 Hemiplegia and hemiparesis following cerebral infarction affecting left non-dominant side: Secondary | ICD-10-CM | POA: Diagnosis not present

## 2015-09-23 DIAGNOSIS — I4891 Unspecified atrial fibrillation: Secondary | ICD-10-CM | POA: Diagnosis not present

## 2015-09-23 DIAGNOSIS — I4892 Unspecified atrial flutter: Secondary | ICD-10-CM | POA: Diagnosis not present

## 2015-09-24 DIAGNOSIS — I69354 Hemiplegia and hemiparesis following cerebral infarction affecting left non-dominant side: Secondary | ICD-10-CM | POA: Diagnosis not present

## 2015-09-24 DIAGNOSIS — R1312 Dysphagia, oropharyngeal phase: Secondary | ICD-10-CM | POA: Diagnosis not present

## 2015-09-24 DIAGNOSIS — I10 Essential (primary) hypertension: Secondary | ICD-10-CM | POA: Diagnosis not present

## 2015-09-24 DIAGNOSIS — I4892 Unspecified atrial flutter: Secondary | ICD-10-CM | POA: Diagnosis not present

## 2015-09-24 DIAGNOSIS — G3184 Mild cognitive impairment, so stated: Secondary | ICD-10-CM | POA: Diagnosis not present

## 2015-09-24 DIAGNOSIS — I4891 Unspecified atrial fibrillation: Secondary | ICD-10-CM | POA: Diagnosis not present

## 2015-09-25 DIAGNOSIS — I69354 Hemiplegia and hemiparesis following cerebral infarction affecting left non-dominant side: Secondary | ICD-10-CM | POA: Diagnosis not present

## 2015-09-25 DIAGNOSIS — I4891 Unspecified atrial fibrillation: Secondary | ICD-10-CM | POA: Diagnosis not present

## 2015-09-25 DIAGNOSIS — I4892 Unspecified atrial flutter: Secondary | ICD-10-CM | POA: Diagnosis not present

## 2015-09-25 DIAGNOSIS — R1312 Dysphagia, oropharyngeal phase: Secondary | ICD-10-CM | POA: Diagnosis not present

## 2015-09-25 DIAGNOSIS — G3184 Mild cognitive impairment, so stated: Secondary | ICD-10-CM | POA: Diagnosis not present

## 2015-09-25 DIAGNOSIS — I10 Essential (primary) hypertension: Secondary | ICD-10-CM | POA: Diagnosis not present

## 2015-09-26 ENCOUNTER — Ambulatory Visit (INDEPENDENT_AMBULATORY_CARE_PROVIDER_SITE_OTHER): Payer: Medicare Other | Admitting: Internal Medicine

## 2015-09-26 ENCOUNTER — Encounter: Payer: Self-pay | Admitting: Internal Medicine

## 2015-09-26 VITALS — BP 118/68 | HR 68 | Temp 98.1°F | Ht 66.0 in | Wt 129.4 lb

## 2015-09-26 DIAGNOSIS — I10 Essential (primary) hypertension: Secondary | ICD-10-CM | POA: Diagnosis not present

## 2015-09-26 DIAGNOSIS — R350 Frequency of micturition: Secondary | ICD-10-CM | POA: Diagnosis not present

## 2015-09-26 DIAGNOSIS — G3184 Mild cognitive impairment, so stated: Secondary | ICD-10-CM | POA: Diagnosis not present

## 2015-09-26 DIAGNOSIS — I4891 Unspecified atrial fibrillation: Secondary | ICD-10-CM | POA: Diagnosis not present

## 2015-09-26 DIAGNOSIS — Z8673 Personal history of transient ischemic attack (TIA), and cerebral infarction without residual deficits: Secondary | ICD-10-CM

## 2015-09-26 DIAGNOSIS — F0150 Vascular dementia without behavioral disturbance: Secondary | ICD-10-CM | POA: Diagnosis not present

## 2015-09-26 DIAGNOSIS — I639 Cerebral infarction, unspecified: Secondary | ICD-10-CM | POA: Diagnosis not present

## 2015-09-26 DIAGNOSIS — I69354 Hemiplegia and hemiparesis following cerebral infarction affecting left non-dominant side: Secondary | ICD-10-CM | POA: Diagnosis not present

## 2015-09-26 DIAGNOSIS — I4892 Unspecified atrial flutter: Secondary | ICD-10-CM | POA: Diagnosis not present

## 2015-09-26 DIAGNOSIS — N3281 Overactive bladder: Secondary | ICD-10-CM | POA: Diagnosis not present

## 2015-09-26 DIAGNOSIS — R1312 Dysphagia, oropharyngeal phase: Secondary | ICD-10-CM | POA: Diagnosis not present

## 2015-09-26 LAB — POCT URINALYSIS DIPSTICK
BILIRUBIN UA: NEGATIVE
Blood, UA: NEGATIVE
Glucose, UA: NEGATIVE
KETONES UA: NEGATIVE
LEUKOCYTES UA: NEGATIVE
Nitrite, UA: NEGATIVE
PH UA: 5
PROTEIN UA: NEGATIVE
SPEC GRAV UA: 1.015
Urobilinogen, UA: NEGATIVE

## 2015-09-26 MED ORDER — LORAZEPAM 0.5 MG PO TABS: 0.5000 mg | ORAL_TABLET | Freq: Three times a day (TID) | ORAL | Status: DC | PRN

## 2015-09-26 MED ORDER — MIRABEGRON ER 50 MG PO TB24: 50.0000 mg | ORAL_TABLET | Freq: Every day | ORAL | Status: DC

## 2015-09-26 MED FILL — MYRBETRIQ ER 50 MG TABLET: 50 | 30 days supply | Qty: 30 | Fill #0

## 2015-09-26 NOTE — Patient Instructions (Addendum)
Increase mybetriq to 50mg  daily - new rx sent to the pharmacy  Continue other medications as ordered  Follow up with specialists as scheduled  Keep appt as scheduled in July with Dr Eulas Post

## 2015-09-26 NOTE — Progress Notes (Signed)
Patient ID: [name], female   DOB: 1925/02/23, 82 y.o.   MRN: [id number]    Location:  [facility] Place of Service: OFFICE  Chief Complaint  Patient presents with  . Acute Visit    urine frequency    HPI:  82 yo female seen today for urinary frequency since yesterday at 0430. She has been going q25mn-1 hr. She had similar sx's in the hospital and she was though to have bladder spasms. She denies dysuria but has urgency. No hematuria. No abdominal pain. (+) sinus HA. Daughters started her on cranberry tabs. She was unable to obtain urine sample as she missed the toilet hat. Has seen urology in the past for recurrent UTI.  Of note she was taken to the ED earlier this month for UTI sx and UA s/o UTI . She was started on keflex but [facility] She rec'd zosyn IV and sent back home. Repeat BC (-) and [facility]. She has completed keflex tx.  She is a poor historian due to dementia. Hx obtained from daughters.  Afib/HTN - Dr GEinar Gip Takes eliquis. Rate controlled off med. She has hx frequent hypotension.  Hx CVA with left hemiparesis/TIAs - Dr SLeonie Man Takes depakote for sz prophylaxis and migraine.  Memory loss - sees neurology. Has tried aricept but it caused GI upset after 2 doses.. She has intermittent auditory hallucinations (music -"silent night", voices) since early 2017. She lives with her 2 daughters    Past Medical History  Diagnosis Date  . Arthritis   . Hypertension   . Migraine   . Colon polyp   . Hearing difficulty   . Torus palatinus   . Atrial flutter ([place] January, 2012  . Stroke ([place] 08/02/12     right lenticular nucleus infarct  . Left leg weakness 02/05/2013  . Chronic anticoagulation   . High cholesterol   . Atrial fibrillation ([place]     on Eliquis Rx  . Mild cognitive impairment with memory loss   . Malnutrition ([place]   . TIA (transient ischemic attack)   . Constipation   . Osteoporosis   .  Hemorrhoids   . Left-sided weakness   . Acute encephalopathy   . HLD (hyperlipidemia)   . Acute cystitis without hematuria   . A-fib ([facility]     Past Surgical History  Procedure Laterality Date  . Cataract extraction w/ intraocular lens  implant, bilateral  2006-2008  . Ganglion cyst excision Bilateral 1938,1954,2003,2005    "wrists/hand" (08/01/2012)  . Cardioversion  05/19/2010    Dr. GEinar Gip . Tonsillectomy  ~ 1935  . Appendectomy  02/19/53    `  . Mouth surgery      [name]    Patient Care Team: MGildardo Cranker DO as PCP - General (Internal Medicine)  Social History   Social History  . Marital Status: Widowed    Spouse Name: N/A  . Number of Children: 3  . Years of Education: college   Occupational History  . 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    Social History Main Topics  . Smoking status: Former SResearch scientist (life sciences) . Smokeless tobacco: Never Used  . Alcohol Use: No     Comment: 08/01/2012 "glass of wine on special occasions"  . Drug Use: No  . Sexual Activity: No   Other Topics Concern  . Not on file   Social History Narrative   Patient lives at home with daughters.  Caffeine use: 1/2-1 cup of coffee occasionally         Diet: Regular      Do you drink/ eat things with caffeine? Yes      Marital status:   Widowed                            What year were you married ?       Do you live in a house, apartment,assistred living, condo, trailer, etc.)? House      Is it one or more stories? Yes      How many persons live in your home ? 4      Do you have any pets in your home ?(please list) Yes/ 1 Dog, 2 Cats      Current or past profession: Editor      Do you exercise? No                             Type & how often:      Do you have a living will? Yes      Do you have a DNR form?  Yes                     If not, do you want to discuss one?       Do you have signed POA?HPOA forms? Yes                If so, please bring to your        appointment      regular      reports that she has quit smoking. She has never used smokeless tobacco. She reports that she does not drink alcohol or use illicit drugs.  Family History  Problem Relation Age of Onset  . Colon cancer Father   . Breast cancer    . Brain cancer    . Cancer Mother     breast  . Aneurysm Mother     brain  . Heart attack Neg Hx   . Diabetes Neg Hx   . Hypertension Neg Hx   . Stroke Mother    Family Status  Relation Status Death Age  . Mother Other     blood clot to brain  . Father Deceased     old age  . Daughter Alive   . Daughter Alive   . Daughter Alive   . Maternal Grandmother Deceased   . Maternal Grandfather Deceased   . Paternal Grandmother Deceased   . Paternal Grandfather Deceased      Allergies  Allergen Reactions  . Aricept [Donepezil Hcl] Hypertension    With N/V  . Tramadol Hcl Other (See Comments)     lethargy, nausea  . Alendronate Sodium Rash and Other (See Comments)    puffy eyes    Medications: Patient's Medications  New Prescriptions   No medications on file  Previous Medications   ACETAMINOPHEN (TYLENOL) 325 MG TABLET    Take 2 tablets (650 mg total) by mouth every 4 (four) hours as needed for mild pain (or temp >/= 99.5 F).   B COMPLEX VITAMINS TABLET    Take 1 tablet by mouth 2 (two) times daily.    CALCIUM CARB-CHOLECALCIFEROL (CALCIUM 600 + D) 600-200 MG-UNIT TABS    Take 1 tablet by mouth 2 (two) times daily.   CETIRIZINE (ZYRTEC) 10  MG TABLET    Take 10 mg by mouth at bedtime.   CRANBERRY 250 MG CAPS    Take 1 capsule by mouth daily.   DIVALPROEX (DEPAKOTE) 125 MG DR TABLET    Take 2 tablets (250 mg total) by mouth 2 (two) times daily.   DOCUSATE SODIUM (COLACE PO)    Take 100 mg by mouth 2 (two) times daily.    ELIQUIS 2.5 MG TABS TABLET    TAKE 1 TABLET BY MOUTH TWICE DAILY   ERGOCALCIFEROL (VITAMIN D2) 2000 UNITS TABS    Take 1 tablet by mouth daily.   HYDROCORTISONE (ANUSOL-HC) 2.5 % RECTAL CREAM    Place 1 application rectally  as needed for hemorrhoids or itching.   HYDROCORTISONE (ANUSOL-HC) 25 MG SUPPOSITORY    Place 25 mg rectally as needed for hemorrhoids or itching.   LORAZEPAM (ATIVAN) 0.5 MG TABLET    Take 0.5 mg by mouth every 8 (eight) hours as needed for anxiety.    MELATONIN 3 MG TABS    Take 1 tablet by mouth at bedtime   MIRABEGRON ER (MYRBETRIQ) 25 MG TB24 TABLET    Take 1 tablet (25 mg total) by mouth daily.   MULTIPLE VITAMIN (MULTIVITAMIN WITH MINERALS) TABS    Take 1 tablet by mouth daily.   OLOPATADINE (PATANOL) 0.1 % OPHTHALMIC SOLUTION    Place 1 drop into both eyes 2 (two) times daily.   ONDANSETRON (ZOFRAN) 4 MG TABLET    Take 1 tablet (4 mg total) by mouth every 6 (six) hours as needed for nausea.   PROTEIN (PROCEL 100) POWD    Take 1 scoop by mouth 2 (two) times daily.  Modified Medications   No medications on file  Discontinued Medications   AMLODIPINE (NORVASC) 2.5 MG TABLET    Take 2.5 mg by mouth daily. Reported on 09/26/2015   DIPHENHYDRAMINE-ACETAMINOPHEN (TYLENOL PM) 25-500 MG TABS    Take 2 tablets by mouth at bedtime as needed (sleep). Reported on 09/26/2015   FAMOTIDINE (PEPCID) 20 MG TABLET    Take 20 mg by mouth 2 (two) times daily. Reported on 09/26/2015   LORAZEPAM (ATIVAN) 0.5 MG TABLET    1 tablet by mouth daily at 4 PM scheduled, take 1/2 tablet by mouth twice daily as needed for anxiety   SENNA-DOCUSATE (SENOKOT-S) 8.6-50 MG TABLET    Take 1 tablet by mouth at bedtime as needed for mild constipation.    Review of Systems  Unable to perform ROS: Dementia    Filed Vitals:   09/26/15 1610  BP: 118/68  Pulse: 68  Temp: 98.1 F (36.7 C)  TempSrc: Oral  Height: _0  (1.676 m)  Weight: 129 lb 6.4 oz (58.695 kg)  SpO2: 94%   Body mass index is 20.9 kg/(m^2).  Physical Exam  Constitutional: She appears well-developed.  Frail appearing, Sitting in w/c  HENT:  Mouth/Throat: Oropharynx is clear and moist. No oropharyngeal exudate.  Eyes: Pupils are equal, round, and  reactive to light. No scleral icterus.  Neck: Neck supple. Carotid bruit is not present. No tracheal deviation present. No thyromegaly present.  Cardiovascular: Normal rate and intact distal pulses.  An irregularly irregular rhythm present. Exam reveals no gallop and no friction rub.   Murmur heard.  Systolic murmur is present with a grade of 1/6  No LE edema b/l. no calf TTP.   Pulmonary/Chest: Effort normal and breath sounds normal. No stridor. No respiratory distress. She has no wheezes. She has no rales.  Abdominal: Soft. Bowel sounds are normal. She exhibits no distension and no mass. There is no hepatomegaly. There is no tenderness. There is no rebound and no guarding.  Musculoskeletal: She exhibits edema and tenderness.  LUE contracture noted at elbow  Lymphadenopathy:    She has no cervical adenopathy.  Neurological: She is alert.  LUE strength 4/5; LLE 3/5; left facial droop  Skin: Skin is warm and dry. No rash noted.  Psychiatric: She has a normal mood and affect. Her behavior is normal.     Labs reviewed: Admission on 09/13/2015, Discharged on 09/13/2015  Component Date Value Ref Range Status  . WBC 09/13/2015 5.2  4.0 - 10.5 K/uL Final  . RBC 09/13/2015 4.02  3.87 - 5.11 MIL/uL Final  . Hemoglobin 09/13/2015 12.6  12.0 - 15.0 g/dL Final  . HCT 09/13/2015 37.9  36.0 - 46.0 % Final  . MCV 09/13/2015 94.3  78.0 - 100.0 fL Final  . MCH 09/13/2015 31.3  26.0 - 34.0 pg Final  . MCHC 09/13/2015 33.2  30.0 - 36.0 g/dL Final  . RDW 09/13/2015 13.6  11.5 - 15.5 % Final  . Platelets 09/13/2015 177  150 - 400 K/uL Final  . Neutrophils Relative % 09/13/2015 53   Final  . Neutro Abs 09/13/2015 2.8  1.7 - 7.7 K/uL Final  . Lymphocytes Relative 09/13/2015 28   Final  . Lymphs Abs 09/13/2015 1.5  0.7 - 4.0 K/uL Final  . Monocytes Relative 09/13/2015 17   Final  . Monocytes Absolute 09/13/2015 0.9  0.1 - 1.0 K/uL Final  . Eosinophils Relative 09/13/2015 2   Final  . Eosinophils  Absolute 09/13/2015 0.1  0.0 - 0.7 K/uL Final  . Basophils Relative 09/13/2015 0   Final  . Basophils Absolute 09/13/2015 0.0  0.0 - 0.1 K/uL Final  . Sodium 09/13/2015 142  135 - 145 mmol/L Final  . Potassium 09/13/2015 4.2  3.5 - 5.1 mmol/L Final  . Chloride 09/13/2015 107  101 - 111 mmol/L Final  . CO2 09/13/2015 27  22 - 32 mmol/L Final  . Glucose, Bld 09/13/2015 97  65 - 99 mg/dL Final  . BUN 09/13/2015 13  6 - 20 mg/dL Final  . Creatinine, Ser 09/13/2015 0.70  0.44 - 1.00 mg/dL Final  . Calcium 09/13/2015 9.3  8.9 - 10.3 mg/dL Final  . GFR calc non Af Amer 09/13/2015 >60  >60 mL/min Final  . GFR calc Af Amer 09/13/2015 >60  >60 mL/min Final   Comment: (NOTE) The eGFR has been calculated using the CKD EPI equation. This calculation has not been validated in all clinical situations. eGFR's persistently <60 mL/min signify possible Chronic Kidney Disease.   . Anion gap 09/13/2015 8  5 - 15 Final  . Lactic Acid, Venous 09/13/2015 1.08  0.5 - 2.0 mmol/L Final  . Specimen Description 09/13/2015 BLOOD LEFT ARM BOTTLES DRAWN AEROBIC AND ANAEROBIC   Final  . Special Requests 09/13/2015 NONE   Final  . Culture 09/13/2015    Final                   Value:NO GROWTH 5 DAYS Performed at [facility]   . Report Status 09/13/2015 09/18/2015 FINAL   Final  . Specimen Description 09/13/2015 BLOOD RIGHT FOREARM BOTTLES DRAWN AEROBIC AND ANAEROBIC   Final  . Special Requests 09/13/2015 NONE   Final  . Culture 09/13/2015    Final  Value:NO GROWTH 5 DAYS Performed at [facility]   . Report Status 09/13/2015 09/18/2015 FINAL   Final  Admission on 09/12/2015, Discharged on 09/12/2015  Component Date Value Ref Range Status  . Sodium 09/12/2015 142  135 - 145 mmol/L Final  . Potassium 09/12/2015 4.2  3.5 - 5.1 mmol/L Final  . Chloride 09/12/2015 104  101 - 111 mmol/L Final  . CO2 09/12/2015 31  22 - 32 mmol/L Final  . Glucose, Bld 09/12/2015 89  65 - 99 mg/dL Final   . BUN 09/12/2015 15  6 - 20 mg/dL Final  . Creatinine, Ser 09/12/2015 0.72  0.44 - 1.00 mg/dL Final  . Calcium 09/12/2015 9.1  8.9 - 10.3 mg/dL Final  . Total Protein 09/12/2015 6.4* 6.5 - 8.1 g/dL Final  . Albumin 09/12/2015 3.4* 3.5 - 5.0 g/dL Final  . AST 09/12/2015 21  15 - 41 U/L Final  . ALT 09/12/2015 11* 14 - 54 U/L Final  . Alkaline Phosphatase 09/12/2015 49  38 - 126 U/L Final  . Total Bilirubin 09/12/2015 0.4  0.3 - 1.2 mg/dL Final  . GFR calc non Af Amer 09/12/2015 >60  >60 mL/min Final  . GFR calc Af Amer 09/12/2015 >60  >60 mL/min Final   Comment: (NOTE) The eGFR has been calculated using the CKD EPI equation. This calculation has not been validated in all clinical situations. eGFR's persistently <60 mL/min signify possible Chronic Kidney Disease.   . Anion gap 09/12/2015 7  5 - 15 Final  . Lactic Acid, Venous 09/12/2015 0.66  0.5 - 2.0 mmol/L Final  . WBC 09/12/2015 5.6  4.0 - 10.5 K/uL Final  . RBC 09/12/2015 3.97  3.87 - 5.11 MIL/uL Final  . Hemoglobin 09/12/2015 12.4  12.0 - 15.0 g/dL Final  . HCT 09/12/2015 37.4  36.0 - 46.0 % Final  . MCV 09/12/2015 94.2  78.0 - 100.0 fL Final  . MCH 09/12/2015 31.2  26.0 - 34.0 pg Final  . MCHC 09/12/2015 33.2  30.0 - 36.0 g/dL Final  . RDW 09/12/2015 13.6  11.5 - 15.5 % Final  . Platelets 09/12/2015 180  150 - 400 K/uL Final  . Neutrophils Relative % 09/12/2015 52   Final  . Neutro Abs 09/12/2015 2.9  1.7 - 7.7 K/uL Final  . Lymphocytes Relative 09/12/2015 31   Final  . Lymphs Abs 09/12/2015 1.7  0.7 - 4.0 K/uL Final  . Monocytes Relative 09/12/2015 14   Final  . Monocytes Absolute 09/12/2015 0.8  0.1 - 1.0 K/uL Final  . Eosinophils Relative 09/12/2015 2   Final  . Eosinophils Absolute 09/12/2015 0.1  0.0 - 0.7 K/uL Final  . Basophils Relative 09/12/2015 1   Final  . Basophils Absolute 09/12/2015 0.0  0.0 - 0.1 K/uL Final  . Specimen Description 09/12/2015 BLOOD RIGHT ARM   Final  . Special Requests 09/12/2015 BOTTLES  DRAWN AEROBIC AND ANAEROBIC 5CC   Final  . Culture  Setup Time 09/12/2015    Final                   Value:GRAM POSITIVE COCCI IN CLUSTERS IN BOTH AEROBIC AND ANAEROBIC BOTTLES CRITICAL RESULT CALLED TO, READ BACK BY AND VERIFIED WITH: ROSE CULLOM,FLOW MANAGER AT [place] ON [id number] BY S. YARBROUGH   . Culture 09/12/2015 *  Final                   Value:STAPHYLOCOCCUS SPECIES (COAGULASE NEGATIVE) THE SIGNIFICANCE OF ISOLATING THIS  ORGANISM FROM A SINGLE SET OF BLOOD CULTURES WHEN MULTIPLE SETS ARE DRAWN IS UNCERTAIN. PLEASE NOTIFY THE MICROBIOLOGY DEPARTMENT WITHIN ONE WEEK IF SPECIATION AND SENSITIVITIES ARE REQUIRED. Performed at [facility]   . Report Status 09/12/2015 09/15/2015 FINAL   Final  . Color, Urine 09/12/2015 YELLOW  YELLOW Final  . APPearance 09/12/2015 CLEAR  CLEAR Final  . Specific Gravity, Urine 09/12/2015 1.015  1.005 - 1.030 Final  . pH 09/12/2015 7.0  5.0 - 8.0 Final  . Glucose, UA 09/12/2015 NEGATIVE  NEGATIVE mg/dL Final  . Hgb urine dipstick 09/12/2015 NEGATIVE  NEGATIVE Final  . Bilirubin Urine 09/12/2015 NEGATIVE  NEGATIVE Final  . Ketones, ur 09/12/2015 NEGATIVE  NEGATIVE mg/dL Final  . Protein, ur 09/12/2015 NEGATIVE  NEGATIVE mg/dL Final  . Nitrite 09/12/2015 NEGATIVE  NEGATIVE Final  . Leukocytes, UA 09/12/2015 MODERATE* NEGATIVE Final  . Specimen Description 09/12/2015 URINE, CLEAN CATCH   Final  . Special Requests 09/12/2015 NONE   Final  . Culture 09/12/2015 MULTIPLE SPECIES PRESENT, SUGGEST RECOLLECTION*  Final  . Report Status 09/12/2015 09/14/2015 FINAL   Final  . Troponin I 09/12/2015 <0.03  <0.031 ng/mL Final   Comment:        NO INDICATION OF MYOCARDIAL INJURY.   . Magnesium 09/12/2015 1.9  1.7 - 2.4 mg/dL Final  . Squamous Epithelial / LPF 09/12/2015 0-5* NONE SEEN Final  . WBC, UA 09/12/2015 6-30  0 - 5 WBC/hpf Final  . RBC / HPF 09/12/2015 0-5  0 - 5 RBC/hpf Final  . Bacteria, UA 09/12/2015 MANY* NONE SEEN Final  . Enterococcus species  09/12/2015 NOT DETECTED  NOT DETECTED Final  . Vancomycin resistance 09/12/2015 NOT DETECTED  NOT DETECTED Final  . Listeria monocytogenes 09/12/2015 NOT DETECTED  NOT DETECTED Final  . Staphylococcus species 09/12/2015 DETECTED* NOT DETECTED Final   Comment: CRITICAL RESULT CALLED TO, READ BACK BY AND VERIFIED WITH: ROSE COLLUM, FLOW MANAGER AT 1132 ON [id number] BY S. YARBROUGH   . Staphylococcus aureus 09/12/2015 NOT DETECTED  NOT DETECTED Final  . Methicillin resistance 09/12/2015 NOT DETECTED  NOT DETECTED Final  . Streptococcus species 09/12/2015 NOT DETECTED  NOT DETECTED Final  . Streptococcus agalactiae 09/12/2015 NOT DETECTED  NOT DETECTED Final  . Streptococcus pneumoniae 09/12/2015 NOT DETECTED  NOT DETECTED Final  . Streptococcus pyogenes 09/12/2015 NOT DETECTED  NOT DETECTED Final  . Acinetobacter baumannii 09/12/2015 NOT DETECTED  NOT DETECTED Final  . Enterobacteriaceae species 09/12/2015 NOT DETECTED  NOT DETECTED Final  . Enterobacter cloacae complex 09/12/2015 NOT DETECTED  NOT DETECTED Final  . Escherichia coli 09/12/2015 NOT DETECTED  NOT DETECTED Final  . Klebsiella oxytoca 09/12/2015 NOT DETECTED  NOT DETECTED Final  . Klebsiella pneumoniae 09/12/2015 NOT DETECTED  NOT DETECTED Final  . Proteus species 09/12/2015 NOT DETECTED  NOT DETECTED Final  . Serratia marcescens 09/12/2015 NOT DETECTED  NOT DETECTED Final  . Carbapenem resistance 09/12/2015 NOT DETECTED  NOT DETECTED Final  . Haemophilus influenzae 09/12/2015 NOT DETECTED  NOT DETECTED Final  . Neisseria meningitidis 09/12/2015 NOT DETECTED  NOT DETECTED Final  . Pseudomonas aeruginosa 09/12/2015 NOT DETECTED  NOT DETECTED Final  . Candida albicans 09/12/2015 NOT DETECTED  NOT DETECTED Final  . Candida glabrata 09/12/2015 NOT DETECTED  NOT DETECTED Final  . Candida krusei 09/12/2015 NOT DETECTED  NOT DETECTED Final  . Candida parapsilosis 09/12/2015 NOT DETECTED  NOT DETECTED Final  . Candida tropicalis  09/12/2015 NOT DETECTED  NOT DETECTED Final   Performed at [facility]  [place] on 09/05/2015  Component Date Value Ref Range Status  . Hemoglobin 08/19/2015 13.4  12.0 - 16.0 g/dL Final  . HCT 08/19/2015 40  36 - 46 % Final  . Neutrophils Absolute 08/19/2015 4148   Final  . Platelets 08/19/2015 198  150 - 399 K/L Final  . WBC 08/19/2015 6.8   Final  . Glucose 08/19/2015 184   Final  . BUN 08/19/2015 14  4 - 21 mg/dL Final  . Creatinine 08/19/2015 1.1  0.5 - 1.1 mg/dL Final  . Potassium 08/19/2015 3.9  3.4 - 5.3 mmol/L Final  . Sodium 08/19/2015 141  137 - 147 mmol/L Final  . Hemoglobin 08/14/2015 13.0  12.0 - 16.0 g/dL Final  . HCT 08/14/2015 40  36 - 46 % Final  . Neutrophils Absolute 08/14/2015 2   Final  . Platelets 08/14/2015 166  150 - 399 K/L Final  . WBC 08/14/2015 4.0   Final  . Glucose 08/14/2015 77   Final  . BUN 08/14/2015 12  4 - 21 mg/dL Final  . Creatinine 08/14/2015 0.8  0.5 - 1.1 mg/dL Final  . Potassium 08/14/2015 3.9  3.4 - 5.3 mmol/L Final  . Sodium 08/14/2015 145  137 - 147 mmol/L Final  Admission on 08/07/2015, Discharged on 08/12/2015  No results displayed because visit has over 200 results.    Admission on 07/31/2015, Discharged on 07/31/2015  Component Date Value Ref Range Status  . WBC 07/31/2015 5.1  4.0 - 10.5 K/uL Final  . RBC 07/31/2015 4.12  3.87 - 5.11 MIL/uL Final  . Hemoglobin 07/31/2015 13.0  12.0 - 15.0 g/dL Final  . HCT 07/31/2015 39.3  36.0 - 46.0 % Final  . MCV 07/31/2015 95.4  78.0 - 100.0 fL Final  . MCH 07/31/2015 31.6  26.0 - 34.0 pg Final  . MCHC 07/31/2015 33.1  30.0 - 36.0 g/dL Final  . RDW 07/31/2015 14.0  11.5 - 15.5 % Final  . Platelets 07/31/2015 167  150 - 400 K/uL Final  . Neutrophils Relative % 07/31/2015 64   Final  . Neutro Abs 07/31/2015 3.3  1.7 - 7.7 K/uL Final  . Lymphocytes Relative 07/31/2015 22   Final  . Lymphs Abs 07/31/2015 1.1  0.7 - 4.0 K/uL Final  . Monocytes Relative 07/31/2015 11   Final  .  Monocytes Absolute 07/31/2015 0.5  0.1 - 1.0 K/uL Final  . Eosinophils Relative 07/31/2015 2   Final  . Eosinophils Absolute 07/31/2015 0.1  0.0 - 0.7 K/uL Final  . Basophils Relative 07/31/2015 1   Final  . Basophils Absolute 07/31/2015 0.0  0.0 - 0.1 K/uL Final  . Sodium 07/31/2015 142  135 - 145 mmol/L Final  . Potassium 07/31/2015 4.1  3.5 - 5.1 mmol/L Final  . Chloride 07/31/2015 106  101 - 111 mmol/L Final  . CO2 07/31/2015 30  22 - 32 mmol/L Final  . Glucose, Bld 07/31/2015 127* 65 - 99 mg/dL Final  . BUN 07/31/2015 20  6 - 20 mg/dL Final  . Creatinine, Ser 07/31/2015 0.88  0.44 - 1.00 mg/dL Final  . Calcium 07/31/2015 8.9  8.9 - 10.3 mg/dL Final  . GFR calc non Af Amer 07/31/2015 57* >60 mL/min Final  . GFR calc Af Amer 07/31/2015 >60  >60 mL/min Final   Comment: (NOTE) The eGFR has been calculated using the CKD EPI equation. This calculation has not been validated in all clinical situations. eGFR's persistently <60 mL/min signify possible  Chronic Kidney Disease.   . Anion gap 07/31/2015 6  5 - 15 Final  . Troponin I 07/31/2015 <0.03  <0.031 ng/mL Final   Comment:        NO INDICATION OF MYOCARDIAL INJURY.     Dg Chest 2 View  09/12/2015  CLINICAL DATA:  Weakness.  Confusion and possible sepsis. EXAM: CHEST  2 VIEW COMPARISON:  07/31/2015 FINDINGS: Few hazy densities at the left costophrenic angle appear stable. Otherwise, the lungs are clear. Heart and mediastinum are within normal limits. No pleural effusions. Mild degenerative changes in the thoracic spine. IMPRESSION: No active cardiopulmonary disease. Electronically Signed   By: Markus Daft M.D.   On: 09/12/2015 13:55   Ct Head Wo Contrast  09/12/2015  CLINICAL DATA:  Left-sided weakness and headache.  Fatigue. EXAM: CT HEAD WITHOUT CONTRAST TECHNIQUE: Contiguous axial images were obtained from the base of the skull through the vertex without intravenous contrast. COMPARISON:  Head CT Aug 07, 2015 and brain MRI Aug 07, 2015 FINDINGS: Moderate diffuse atrophy is stable. There is no intracranial mass, hemorrhage, extra-axial fluid collection, or midline shift. There is small vessel disease throughout the centra semiovale bilaterally, stable. There is a prior infarct in the left mid parietal lobe, stable. There is a small lacunar infarct at the junction of the inferior right centrum semiovale and right lentiform nucleus, stable. There is a prior small lacunar infarct in the superior medial left cerebellum. No new gray-white compartment lesion is identified. No acute infarct is evident. The bony calvarium appears intact. The mastoid air cells are clear. No intraorbital lesions are appreciable. There is extensive arthropathy in the left temporomandibular joint. IMPRESSION: Moderate diffuse atrophy with periventricular small vessel disease as well as prior infarcts as summarized above. No acute infarct is evident. No hemorrhage or mass effect. Extensive left temporomandibular joint arthropathic change. Electronically Signed   By: Lowella Grip III M.D.   On: 09/12/2015 15:16     Assessment/Plan   ICD-9-CM ICD-10-CM   1. OAB (overactive bladder) - failing to change as expected 596.51 N32.81 mirabegron ER (MYRBETRIQ) 50 MG TB24 tablet     POC Urinalysis Dipstick     Culture, Urine  2. Vascular dementia, without behavioral disturbance 290.40 F01.50   3. History of CVA (cerebrovascular accident) V12.54 Z86.73   4. Urinary frequency - likely due to #1 788.41 R35.0 POC Urinalysis Dipstick     Culture, Urine   UA showed no obvious UTI but will send for cx as last one in hospital showed contaminants  Increase mybetriq to 60m daily - new rx sent to the pharmacy  Continue other medications as ordered  Follow up with specialists as scheduled  Keep appt as scheduled in July with Dr CArletha Grippe [facility]Perlie [facility] [address]  [phone] (Monday-Friday 8 AM - 5 PM) ([phone] 5 PM and follow prompts

## 2015-09-28 LAB — URINE CULTURE

## 2015-09-29 ENCOUNTER — Telehealth: Payer: Self-pay

## 2015-09-29 DIAGNOSIS — I4892 Unspecified atrial flutter: Secondary | ICD-10-CM | POA: Diagnosis not present

## 2015-09-29 DIAGNOSIS — I10 Essential (primary) hypertension: Secondary | ICD-10-CM | POA: Diagnosis not present

## 2015-09-29 DIAGNOSIS — I69354 Hemiplegia and hemiparesis following cerebral infarction affecting left non-dominant side: Secondary | ICD-10-CM | POA: Diagnosis not present

## 2015-09-29 DIAGNOSIS — I4891 Unspecified atrial fibrillation: Secondary | ICD-10-CM | POA: Diagnosis not present

## 2015-09-29 DIAGNOSIS — G3184 Mild cognitive impairment, so stated: Secondary | ICD-10-CM | POA: Diagnosis not present

## 2015-09-29 DIAGNOSIS — R1312 Dysphagia, oropharyngeal phase: Secondary | ICD-10-CM | POA: Diagnosis not present

## 2015-09-29 NOTE — Telephone Encounter (Signed)
[facility] Nurse) called to get verbal orders to extend PT 2 x weekly x 3 weeks  Per [facility] standing order, verbal order given. Message will be sent to patient's provider as a FYI.

## 2015-10-01 DIAGNOSIS — I69354 Hemiplegia and hemiparesis following cerebral infarction affecting left non-dominant side: Secondary | ICD-10-CM | POA: Diagnosis not present

## 2015-10-01 DIAGNOSIS — R1312 Dysphagia, oropharyngeal phase: Secondary | ICD-10-CM | POA: Diagnosis not present

## 2015-10-01 DIAGNOSIS — I10 Essential (primary) hypertension: Secondary | ICD-10-CM | POA: Diagnosis not present

## 2015-10-01 DIAGNOSIS — I4891 Unspecified atrial fibrillation: Secondary | ICD-10-CM | POA: Diagnosis not present

## 2015-10-01 DIAGNOSIS — I4892 Unspecified atrial flutter: Secondary | ICD-10-CM | POA: Diagnosis not present

## 2015-10-01 DIAGNOSIS — G3184 Mild cognitive impairment, so stated: Secondary | ICD-10-CM | POA: Diagnosis not present

## 2015-10-02 ENCOUNTER — Telehealth: Payer: Self-pay | Admitting: *Deleted

## 2015-10-02 ENCOUNTER — Other Ambulatory Visit: Payer: Self-pay | Admitting: *Deleted

## 2015-10-02 DIAGNOSIS — I69354 Hemiplegia and hemiparesis following cerebral infarction affecting left non-dominant side: Secondary | ICD-10-CM | POA: Diagnosis not present

## 2015-10-02 DIAGNOSIS — G3184 Mild cognitive impairment, so stated: Secondary | ICD-10-CM | POA: Diagnosis not present

## 2015-10-02 DIAGNOSIS — I4892 Unspecified atrial flutter: Secondary | ICD-10-CM | POA: Diagnosis not present

## 2015-10-02 DIAGNOSIS — R1312 Dysphagia, oropharyngeal phase: Secondary | ICD-10-CM | POA: Diagnosis not present

## 2015-10-02 DIAGNOSIS — I4891 Unspecified atrial fibrillation: Secondary | ICD-10-CM | POA: Diagnosis not present

## 2015-10-02 DIAGNOSIS — I10 Essential (primary) hypertension: Secondary | ICD-10-CM | POA: Diagnosis not present

## 2015-10-02 MED ORDER — LORAZEPAM 0.5 MG PO TABS: 0.5000 mg | ORAL_TABLET | Freq: Three times a day (TID) | ORAL | Status: DC | PRN

## 2015-10-02 MED FILL — LORazepam 0.5 MG TABS: 0.5 | 20 days supply | Qty: 60 | Fill #0

## 2015-10-02 NOTE — Telephone Encounter (Signed)
Received a call  from her daughter regarding her Ativan medication, she stated that the pharmacy never receivied the script . Please Advise!

## 2015-10-02 NOTE — Telephone Encounter (Signed)
Please verfy with pharmacy and if so, please send another rx. thanks

## 2015-10-03 DIAGNOSIS — I69354 Hemiplegia and hemiparesis following cerebral infarction affecting left non-dominant side: Secondary | ICD-10-CM | POA: Diagnosis not present

## 2015-10-03 DIAGNOSIS — I4892 Unspecified atrial flutter: Secondary | ICD-10-CM | POA: Diagnosis not present

## 2015-10-03 DIAGNOSIS — I10 Essential (primary) hypertension: Secondary | ICD-10-CM | POA: Diagnosis not present

## 2015-10-03 DIAGNOSIS — I4891 Unspecified atrial fibrillation: Secondary | ICD-10-CM | POA: Diagnosis not present

## 2015-10-03 DIAGNOSIS — R1312 Dysphagia, oropharyngeal phase: Secondary | ICD-10-CM | POA: Diagnosis not present

## 2015-10-03 DIAGNOSIS — G3184 Mild cognitive impairment, so stated: Secondary | ICD-10-CM | POA: Diagnosis not present

## 2015-10-06 DIAGNOSIS — G3184 Mild cognitive impairment, so stated: Secondary | ICD-10-CM | POA: Diagnosis not present

## 2015-10-06 DIAGNOSIS — I4891 Unspecified atrial fibrillation: Secondary | ICD-10-CM | POA: Diagnosis not present

## 2015-10-06 DIAGNOSIS — R1312 Dysphagia, oropharyngeal phase: Secondary | ICD-10-CM | POA: Diagnosis not present

## 2015-10-06 DIAGNOSIS — I69354 Hemiplegia and hemiparesis following cerebral infarction affecting left non-dominant side: Secondary | ICD-10-CM | POA: Diagnosis not present

## 2015-10-06 DIAGNOSIS — I4892 Unspecified atrial flutter: Secondary | ICD-10-CM | POA: Diagnosis not present

## 2015-10-06 DIAGNOSIS — I10 Essential (primary) hypertension: Secondary | ICD-10-CM | POA: Diagnosis not present

## 2015-10-07 DIAGNOSIS — R1312 Dysphagia, oropharyngeal phase: Secondary | ICD-10-CM | POA: Diagnosis not present

## 2015-10-07 DIAGNOSIS — I4892 Unspecified atrial flutter: Secondary | ICD-10-CM | POA: Diagnosis not present

## 2015-10-07 DIAGNOSIS — I69354 Hemiplegia and hemiparesis following cerebral infarction affecting left non-dominant side: Secondary | ICD-10-CM | POA: Diagnosis not present

## 2015-10-07 DIAGNOSIS — I10 Essential (primary) hypertension: Secondary | ICD-10-CM | POA: Diagnosis not present

## 2015-10-07 DIAGNOSIS — G3184 Mild cognitive impairment, so stated: Secondary | ICD-10-CM | POA: Diagnosis not present

## 2015-10-07 DIAGNOSIS — I4891 Unspecified atrial fibrillation: Secondary | ICD-10-CM | POA: Diagnosis not present

## 2015-10-08 DIAGNOSIS — I4891 Unspecified atrial fibrillation: Secondary | ICD-10-CM | POA: Diagnosis not present

## 2015-10-08 DIAGNOSIS — G3184 Mild cognitive impairment, so stated: Secondary | ICD-10-CM | POA: Diagnosis not present

## 2015-10-08 DIAGNOSIS — I10 Essential (primary) hypertension: Secondary | ICD-10-CM | POA: Diagnosis not present

## 2015-10-08 DIAGNOSIS — I4892 Unspecified atrial flutter: Secondary | ICD-10-CM | POA: Diagnosis not present

## 2015-10-08 DIAGNOSIS — I69354 Hemiplegia and hemiparesis following cerebral infarction affecting left non-dominant side: Secondary | ICD-10-CM | POA: Diagnosis not present

## 2015-10-08 DIAGNOSIS — R1312 Dysphagia, oropharyngeal phase: Secondary | ICD-10-CM | POA: Diagnosis not present

## 2015-10-09 DIAGNOSIS — R1312 Dysphagia, oropharyngeal phase: Secondary | ICD-10-CM | POA: Diagnosis not present

## 2015-10-09 DIAGNOSIS — I4891 Unspecified atrial fibrillation: Secondary | ICD-10-CM | POA: Diagnosis not present

## 2015-10-09 DIAGNOSIS — I69354 Hemiplegia and hemiparesis following cerebral infarction affecting left non-dominant side: Secondary | ICD-10-CM | POA: Diagnosis not present

## 2015-10-09 DIAGNOSIS — I10 Essential (primary) hypertension: Secondary | ICD-10-CM | POA: Diagnosis not present

## 2015-10-09 DIAGNOSIS — I4892 Unspecified atrial flutter: Secondary | ICD-10-CM | POA: Diagnosis not present

## 2015-10-09 DIAGNOSIS — G3184 Mild cognitive impairment, so stated: Secondary | ICD-10-CM | POA: Diagnosis not present

## 2015-10-10 ENCOUNTER — Ambulatory Visit (INDEPENDENT_AMBULATORY_CARE_PROVIDER_SITE_OTHER): Payer: Medicare Other | Admitting: Internal Medicine

## 2015-10-10 ENCOUNTER — Encounter: Payer: Self-pay | Admitting: Internal Medicine

## 2015-10-10 ENCOUNTER — Telehealth: Payer: Self-pay

## 2015-10-10 VITALS — BP 122/68 | HR 70 | Temp 97.8°F | Ht 66.0 in | Wt 130.6 lb

## 2015-10-10 DIAGNOSIS — I4892 Unspecified atrial flutter: Secondary | ICD-10-CM

## 2015-10-10 DIAGNOSIS — E46 Unspecified protein-calorie malnutrition: Secondary | ICD-10-CM

## 2015-10-10 DIAGNOSIS — M810 Age-related osteoporosis without current pathological fracture: Secondary | ICD-10-CM | POA: Insufficient documentation

## 2015-10-10 DIAGNOSIS — I10 Essential (primary) hypertension: Secondary | ICD-10-CM | POA: Diagnosis not present

## 2015-10-10 DIAGNOSIS — I639 Cerebral infarction, unspecified: Secondary | ICD-10-CM

## 2015-10-10 DIAGNOSIS — F0150 Vascular dementia without behavioral disturbance: Secondary | ICD-10-CM

## 2015-10-10 DIAGNOSIS — N3281 Overactive bladder: Secondary | ICD-10-CM | POA: Diagnosis not present

## 2015-10-10 DIAGNOSIS — Z8673 Personal history of transient ischemic attack (TIA), and cerebral infarction without residual deficits: Secondary | ICD-10-CM | POA: Diagnosis not present

## 2015-10-10 DIAGNOSIS — R32 Unspecified urinary incontinence: Secondary | ICD-10-CM

## 2015-10-10 MED ORDER — DENOSUMAB 60 MG/ML ~~LOC~~ SOLN
60.0000 mg | Freq: Once | SUBCUTANEOUS | Status: AC
  Administered 2015-10-10: 60 mg via SUBCUTANEOUS

## 2015-10-10 MED ORDER — DARIFENACIN HYDROBROMIDE ER 7.5 MG PO TB24: 7.5000 mg | ORAL_TABLET | Freq: Every day | ORAL | Status: DC

## 2015-10-10 NOTE — Telephone Encounter (Signed)
Does medication require prior authorization? Other meds are on BEERS list.

## 2015-10-10 NOTE — Telephone Encounter (Signed)
Rx for Enablex was rejected. Note from pharmacy states medication as prescribed is not reimbursable by the patient's insurance at this time. Please advise

## 2015-10-10 NOTE — Patient Instructions (Signed)
Take Enablex in the morning and 1 tablet of myrbetriq in the evening for overactive bladder  Continue other medications as ordered  Follow up in 2 mos for routine visit  May need urology eval

## 2015-10-10 NOTE — Progress Notes (Signed)
Patient ID: [name], female   DOB: 1925-02-23, 82 y.o.   MRN: [id number]    Location:  [facility] Place of Service: OFFICE  Chief Complaint  Patient presents with  . Medical Management of Chronic Issues    4 month follow up  . OTHER    Prolia injection    HPI:  82 yo female seen today for f/u. Bladder training unsuccessful and she still voids every hr and some times q15 min first thing in AM. She had similar sx's in the hospital and she was though to have bladder spasms. She denies dysuria but has urgency. No hematuria. No abdominal pain. (+) sinus HA. Daughters started her on cranberry tabs. She was unable to obtain urine sample as she missed the toilet hat. Has seen urology in the past for recurrent UTI.  Of note she was taken to the ED last month for UTI sx and UA s/o UTI . She was started on keflex but was called back to the ED when [facility] (+). She rec'd zosyn IV and sent back home. Repeat BC (-) and it was thought original [facility] was contaminant. She has completed keflex tx.  She is a poor historian due to dementia. Hx obtained from daughters.  Afib/HTN - Dr Einar Gip. Takes eliquis. Rate controlled off med. She has hx frequent hypotension.  Hx CVA with left hemiparesis/TIAs - Dr Leonie Man. Takes depakote for sz prophylaxis and migraine.  Memory loss - sees neurology. Has tried aricept but it caused GI upset after 2 doses.. She has intermittent auditory hallucinations (music -"silent night", voices) since early 2017. She lives with her 2 daughters   Past Medical History  Diagnosis Date  . Arthritis   . Hypertension   . Migraine   . Colon polyp   . Hearing difficulty   . Torus palatinus   . Atrial flutter ([place]) January, 2012  . Stroke ([place]) 08/02/12     right lenticular nucleus infarct  . Left leg weakness 02/05/2013  . Chronic anticoagulation   . High cholesterol   . Atrial fibrillation ([place])     on Eliquis Rx  . Mild cognitive impairment with memory loss   . Malnutrition ([place])   . TIA  (transient ischemic attack)   . Constipation   . Osteoporosis   . Hemorrhoids   . Left-sided weakness   . Acute encephalopathy   . HLD (hyperlipidemia)   . Acute cystitis without hematuria   . A-fib [facility])     Past Surgical History  Procedure Laterality Date  . Cataract extraction w/ intraocular lens  implant, bilateral  2006-2008  . Ganglion cyst excision Bilateral 1938,1954,2003,2005    "wrists/hand" (08/01/2012)  . Cardioversion  05/19/2010    Dr. Einar Gip  . Tonsillectomy  ~ 1935  . Appendectomy  02/19/53    `  . Mouth surgery      [name]    Patient Care Team: Gildardo Cranker, DO as PCP - General (Internal Medicine)  Social History   Social History  . Marital Status: Widowed    Spouse Name: N/A  . Number of Children: 3  . Years of Education: college   Occupational History  . editorial work for [employer]    Social History Main Topics  . Smoking status: Former Research scientist (life sciences)  . Smokeless tobacco: Never Used  . Alcohol Use: No     Comment: 08/01/2012 "glass of wine on special occasions"  . Drug Use: No  . Sexual Activity: No   Other Topics Concern  .  Not on file   Social History Narrative   Patient lives at home with daughters.   Caffeine use: 1/2-1 cup of coffee occasionally         Diet: Regular      Do you drink/ eat things with caffeine? Yes      Marital status:   Widowed                            What year were you married ?       Do you live in a house, apartment,assistred living, condo, trailer, etc.)? House      Is it one or more stories? Yes      How many persons live in your home ? 4      Do you have any pets in your home ?(please list) Yes/ 1 Dog, 2 Cats      Current or past profession: Editor      Do you exercise? No                             Type & how often:      Do you have a living will? Yes      Do you have a DNR form?  Yes                     If not, do you want to discuss one?       Do you have signed POA?HPOA forms? Yes                 If so, please bring to your        appointment      regular     reports that she has quit smoking. She has never used smokeless tobacco. She reports that she does not drink alcohol or use illicit drugs.  Family History  Problem Relation Age of Onset  . Colon cancer Father   . Breast cancer    . Brain cancer    . Cancer Mother     breast  . Aneurysm Mother     brain  . Heart attack Neg Hx   . Diabetes Neg Hx   . Hypertension Neg Hx   . Stroke Mother    Family Status  Relation Status Death Age  . Mother Other     blood clot to brain  . Father Deceased     old age  . Daughter Alive   . Daughter Alive   . Daughter Alive   . Maternal Grandmother Deceased   . Maternal Grandfather Deceased   . Paternal Grandmother Deceased   . Paternal Grandfather Deceased      Allergies  Allergen Reactions  . Aricept [Donepezil Hcl] Hypertension    With N/V  . Tramadol Hcl Other (See Comments)     lethargy, nausea  . Alendronate Sodium Rash and Other (See Comments)    puffy eyes    Medications: Patient's Medications  New Prescriptions   No medications on file  Previous Medications   ACETAMINOPHEN (TYLENOL) 325 MG TABLET    Take 2 tablets (650 mg total) by mouth every 4 (four) hours as needed for mild pain (or temp >/= 99.5 F).   B COMPLEX VITAMINS TABLET    Take 1 tablet by mouth 2 (two) times daily.    CALCIUM CARB-CHOLECALCIFEROL (CALCIUM 600 + D) 600-200 MG-UNIT  TABS    Take 1 tablet by mouth 2 (two) times daily.   CETIRIZINE (ZYRTEC) 10 MG TABLET    Take 10 mg by mouth at bedtime.   CHOLECALCIFEROL (VITAMIN D3) 2000 UNITS TABS    Take 1 tablet by mouth daily.   CRANBERRY 250 MG CAPS    Take 2 capsules by mouth daily.    DIVALPROEX (DEPAKOTE) 125 MG DR TABLET    Take 2 tablets (250 mg total) by mouth 2 (two) times daily.   DOCUSATE SODIUM (COLACE PO)    Take 100 mg by mouth 2 (two) times daily.    ELIQUIS 2.5 MG TABS TABLET    TAKE 1 TABLET BY MOUTH TWICE DAILY    HYDROCORTISONE (ANUSOL-HC) 2.5 % RECTAL CREAM    Place 1 application rectally as needed for hemorrhoids or itching.   HYDROCORTISONE (ANUSOL-HC) 25 MG SUPPOSITORY    Place 25 mg rectally as needed for hemorrhoids or itching.   LORAZEPAM (ATIVAN) 0.5 MG TABLET    Take 1 tablet (0.5 mg total) by mouth every 8 (eight) hours as needed for anxiety.   MELATONIN 3 MG TABS    Take 1 tablet by mouth at bedtime   MIRABEGRON ER (MYRBETRIQ) 50 MG TB24 TABLET    Take 1 tablet (50 mg total) by mouth daily.   MULTIPLE VITAMIN (MULTIVITAMIN WITH MINERALS) TABS    Take 1 tablet by mouth daily.   OLOPATADINE (PATANOL) 0.1 % OPHTHALMIC SOLUTION    Place 1 drop into both eyes 2 (two) times daily.   ONDANSETRON (ZOFRAN) 4 MG TABLET    Take 1 tablet (4 mg total) by mouth every 6 (six) hours as needed for nausea.  Modified Medications   No medications on file  Discontinued Medications   ERGOCALCIFEROL (VITAMIN D2) 2000 UNITS TABS    Take 1 tablet by mouth daily. Reported on 10/10/2015   PROTEIN (PROCEL 100) POWD    Take 1 scoop by mouth 2 (two) times daily. Reported on 10/10/2015    Review of Systems  Unable to perform ROS: Dementia    Filed Vitals:   10/10/15 1123  BP: 122/68  Pulse: 70  Temp: 97.8 F (36.6 C)  TempSrc: Oral  Height: _0  (1.676 m)  Weight: 130 lb 9.6 oz (59.24 kg)  SpO2: 99%   Body mass index is 21.09 kg/(m^2).  Physical Exam  Constitutional: She appears well-developed.  Frail appearing, Sitting in w/c  HENT:  Mouth/Throat: Oropharynx is clear and moist. No oropharyngeal exudate.  Eyes: Pupils are equal, round, and reactive to light. No scleral icterus.  Neck: Neck supple. Carotid bruit is not present. No tracheal deviation present. No thyromegaly present.  Cardiovascular: Normal rate and intact distal pulses.  An irregularly irregular rhythm present. Exam reveals no gallop and no friction rub.   Murmur heard.  Systolic murmur is present with a grade of 1/6  No LE edema b/l. no  calf TTP.   Pulmonary/Chest: Effort normal and breath sounds normal. No stridor. No respiratory distress. She has no wheezes. She has no rales.  Abdominal: Soft. Bowel sounds are normal. She exhibits no distension and no mass. There is no hepatomegaly. There is no tenderness. There is no rebound and no guarding.  Musculoskeletal: She exhibits edema and tenderness.  LUE contracture noted at elbow  Lymphadenopathy:    She has no cervical adenopathy.  Neurological: She is alert.  LUE strength 4/5; LLE 3/5; left facial droop  Skin: Skin is warm and dry.  No rash noted.  Psychiatric: She has a normal mood and affect. Her behavior is normal.     Labs reviewed: Office Visit on 09/26/2015  Component Date Value Ref Range Status  . Color, UA 09/26/2015 yellow   Final  . Clarity, UA 09/26/2015 clear   Final  . Glucose, UA 09/26/2015 neg   Final  . Bilirubin, UA 09/26/2015 neg   Final  . Ketones, UA 09/26/2015 neg   Final  . Spec Grav, UA 09/26/2015 1.015   Final  . Blood, UA 09/26/2015 neg   Final  . pH, UA 09/26/2015 5.0   Final  . Protein, UA 09/26/2015 neg   Final  . Urobilinogen, UA 09/26/2015 negative   Final  . Nitrite, UA 09/26/2015 neg   Final  . Leukocytes, UA 09/26/2015 Negative  Negative Final  . Urine Culture, Routine 09/26/2015 Final report   Final  . Urine Culture result 1 09/26/2015 Comment   Final   Comment: Culture shows less than 10,000 colony forming units of bacteria per milliliter of urine. This colony count is not generally considered to be clinically significant.   Admission on 09/13/2015, Discharged on 09/13/2015  Component Date Value Ref Range Status  . WBC 09/13/2015 5.2  4.0 - 10.5 K/uL Final  . RBC 09/13/2015 4.02  3.87 - 5.11 MIL/uL Final  . Hemoglobin 09/13/2015 12.6  12.0 - 15.0 g/dL Final  . HCT 09/13/2015 37.9  36.0 - 46.0 % Final  . MCV 09/13/2015 94.3  78.0 - 100.0 fL Final  . MCH 09/13/2015 31.3  26.0 - 34.0 pg Final  . MCHC 09/13/2015 33.2  30.0 -  36.0 g/dL Final  . RDW 09/13/2015 13.6  11.5 - 15.5 % Final  . Platelets 09/13/2015 177  150 - 400 K/uL Final  . Neutrophils Relative % 09/13/2015 53   Final  . Neutro Abs 09/13/2015 2.8  1.7 - 7.7 K/uL Final  . Lymphocytes Relative 09/13/2015 28   Final  . Lymphs Abs 09/13/2015 1.5  0.7 - 4.0 K/uL Final  . Monocytes Relative 09/13/2015 17   Final  . Monocytes Absolute 09/13/2015 0.9  0.1 - 1.0 K/uL Final  . Eosinophils Relative 09/13/2015 2   Final  . Eosinophils Absolute 09/13/2015 0.1  0.0 - 0.7 K/uL Final  . Basophils Relative 09/13/2015 0   Final  . Basophils Absolute 09/13/2015 0.0  0.0 - 0.1 K/uL Final  . Sodium 09/13/2015 142  135 - 145 mmol/L Final  . Potassium 09/13/2015 4.2  3.5 - 5.1 mmol/L Final  . Chloride 09/13/2015 107  101 - 111 mmol/L Final  . CO2 09/13/2015 27  22 - 32 mmol/L Final  . Glucose, Bld 09/13/2015 97  65 - 99 mg/dL Final  . BUN 09/13/2015 13  6 - 20 mg/dL Final  . Creatinine, Ser 09/13/2015 0.70  0.44 - 1.00 mg/dL Final  . Calcium 09/13/2015 9.3  8.9 - 10.3 mg/dL Final  . GFR calc non Af Amer 09/13/2015 >60  >60 mL/min Final  . GFR calc Af Amer 09/13/2015 >60  >60 mL/min Final   Comment: (NOTE) The eGFR has been calculated using the CKD EPI equation. This calculation has not been validated in all clinical situations. eGFR's persistently <60 mL/min signify possible Chronic Kidney Disease.   . Anion gap 09/13/2015 8  5 - 15 Final  . Lactic Acid, Venous 09/13/2015 1.08  0.5 - 2.0 mmol/L Final  . Specimen Description 09/13/2015 BLOOD LEFT ARM BOTTLES DRAWN AEROBIC AND ANAEROBIC  Final  . Special Requests 09/13/2015 NONE   Final  . Culture 09/13/2015    Final                   Value:NO GROWTH 5 DAYS Performed at [facility]   . Report Status 09/13/2015 09/18/2015 FINAL   Final  . Specimen Description 09/13/2015 BLOOD RIGHT FOREARM BOTTLES DRAWN AEROBIC AND ANAEROBIC   Final  . Special Requests 09/13/2015 NONE   Final  . Culture 09/13/2015     Final                   Value:NO GROWTH 5 DAYS Performed at [facility]   . Report Status 09/13/2015 09/18/2015 FINAL   Final  Admission on 09/12/2015, Discharged on 09/12/2015  Component Date Value Ref Range Status  . Sodium 09/12/2015 142  135 - 145 mmol/L Final  . Potassium 09/12/2015 4.2  3.5 - 5.1 mmol/L Final  . Chloride 09/12/2015 104  101 - 111 mmol/L Final  . CO2 09/12/2015 31  22 - 32 mmol/L Final  . Glucose, Bld 09/12/2015 89  65 - 99 mg/dL Final  . BUN 09/12/2015 15  6 - 20 mg/dL Final  . Creatinine, Ser 09/12/2015 0.72  0.44 - 1.00 mg/dL Final  . Calcium 09/12/2015 9.1  8.9 - 10.3 mg/dL Final  . Total Protein 09/12/2015 6.4* 6.5 - 8.1 g/dL Final  . Albumin 09/12/2015 3.4* 3.5 - 5.0 g/dL Final  . AST 09/12/2015 21  15 - 41 U/L Final  . ALT 09/12/2015 11* 14 - 54 U/L Final  . Alkaline Phosphatase 09/12/2015 49  38 - 126 U/L Final  . Total Bilirubin 09/12/2015 0.4  0.3 - 1.2 mg/dL Final  . GFR calc non Af Amer 09/12/2015 >60  >60 mL/min Final  . GFR calc Af Amer 09/12/2015 >60  >60 mL/min Final   Comment: (NOTE) The eGFR has been calculated using the CKD EPI equation. This calculation has not been validated in all clinical situations. eGFR's persistently <60 mL/min signify possible Chronic Kidney Disease.   . Anion gap 09/12/2015 7  5 - 15 Final  . Lactic Acid, Venous 09/12/2015 0.66  0.5 - 2.0 mmol/L Final  . WBC 09/12/2015 5.6  4.0 - 10.5 K/uL Final  . RBC 09/12/2015 3.97  3.87 - 5.11 MIL/uL Final  . Hemoglobin 09/12/2015 12.4  12.0 - 15.0 g/dL Final  . HCT 09/12/2015 37.4  36.0 - 46.0 % Final  . MCV 09/12/2015 94.2  78.0 - 100.0 fL Final  . MCH 09/12/2015 31.2  26.0 - 34.0 pg Final  . MCHC 09/12/2015 33.2  30.0 - 36.0 g/dL Final  . RDW 09/12/2015 13.6  11.5 - 15.5 % Final  . Platelets 09/12/2015 180  150 - 400 K/uL Final  . Neutrophils Relative % 09/12/2015 52   Final  . Neutro Abs 09/12/2015 2.9  1.7 - 7.7 K/uL Final  . Lymphocytes Relative 09/12/2015  31   Final  . Lymphs Abs 09/12/2015 1.7  0.7 - 4.0 K/uL Final  . Monocytes Relative 09/12/2015 14   Final  . Monocytes Absolute 09/12/2015 0.8  0.1 - 1.0 K/uL Final  . Eosinophils Relative 09/12/2015 2   Final  . Eosinophils Absolute 09/12/2015 0.1  0.0 - 0.7 K/uL Final  . Basophils Relative 09/12/2015 1   Final  . Basophils Absolute 09/12/2015 0.0  0.0 - 0.1 K/uL Final  . Specimen Description 09/12/2015 BLOOD RIGHT ARM   Final  .  Special Requests 09/12/2015 BOTTLES DRAWN AEROBIC AND ANAEROBIC 5CC   Final  . Culture  Setup Time 09/12/2015    Final                   Value:GRAM POSITIVE COCCI IN CLUSTERS IN BOTH AEROBIC AND ANAEROBIC BOTTLES CRITICAL RESULT CALLED TO, READ BACK BY AND VERIFIED WITH: ROSE CULLOM,FLOW MANAGER AT [place] ON [id number] BY S. YARBROUGH   . Culture 09/12/2015 *  Final                   Value:STAPHYLOCOCCUS SPECIES (COAGULASE NEGATIVE) THE SIGNIFICANCE OF ISOLATING THIS ORGANISM FROM A SINGLE SET OF BLOOD CULTURES WHEN MULTIPLE SETS ARE DRAWN IS UNCERTAIN. PLEASE NOTIFY THE MICROBIOLOGY DEPARTMENT WITHIN ONE WEEK IF SPECIATION AND SENSITIVITIES ARE REQUIRED. Performed at [facility]   . Report Status 09/12/2015 09/15/2015 FINAL   Final  . Color, Urine 09/12/2015 YELLOW  YELLOW Final  . APPearance 09/12/2015 CLEAR  CLEAR Final  . Specific Gravity, Urine 09/12/2015 1.015  1.005 - 1.030 Final  . pH 09/12/2015 7.0  5.0 - 8.0 Final  . Glucose, UA 09/12/2015 NEGATIVE  NEGATIVE mg/dL Final  . Hgb urine dipstick 09/12/2015 NEGATIVE  NEGATIVE Final  . Bilirubin Urine 09/12/2015 NEGATIVE  NEGATIVE Final  . Ketones, ur 09/12/2015 NEGATIVE  NEGATIVE mg/dL Final  . Protein, ur 09/12/2015 NEGATIVE  NEGATIVE mg/dL Final  . Nitrite 09/12/2015 NEGATIVE  NEGATIVE Final  . Leukocytes, UA 09/12/2015 MODERATE* NEGATIVE Final  . Specimen Description 09/12/2015 URINE, CLEAN CATCH   Final  . Special Requests 09/12/2015 NONE   Final  . Culture 09/12/2015 MULTIPLE SPECIES PRESENT,  SUGGEST RECOLLECTION*  Final  . Report Status 09/12/2015 09/14/2015 FINAL   Final  . Troponin I 09/12/2015 <0.03  <0.031 ng/mL Final   Comment:        NO INDICATION OF MYOCARDIAL INJURY.   . Magnesium 09/12/2015 1.9  1.7 - 2.4 mg/dL Final  . Squamous Epithelial / LPF 09/12/2015 0-5* NONE SEEN Final  . WBC, UA 09/12/2015 6-30  0 - 5 WBC/hpf Final  . RBC / HPF 09/12/2015 0-5  0 - 5 RBC/hpf Final  . Bacteria, UA 09/12/2015 MANY* NONE SEEN Final  . Enterococcus species 09/12/2015 NOT DETECTED  NOT DETECTED Final  . Vancomycin resistance 09/12/2015 NOT DETECTED  NOT DETECTED Final  . Listeria monocytogenes 09/12/2015 NOT DETECTED  NOT DETECTED Final  . Staphylococcus species 09/12/2015 DETECTED* NOT DETECTED Final   Comment: CRITICAL RESULT CALLED TO, READ BACK BY AND VERIFIED WITH: ROSE COLLUM, FLOW MANAGER AT 1132 ON [id number] BY S. YARBROUGH   . Staphylococcus aureus 09/12/2015 NOT DETECTED  NOT DETECTED Final  . Methicillin resistance 09/12/2015 NOT DETECTED  NOT DETECTED Final  . Streptococcus species 09/12/2015 NOT DETECTED  NOT DETECTED Final  . Streptococcus agalactiae 09/12/2015 NOT DETECTED  NOT DETECTED Final  . Streptococcus pneumoniae 09/12/2015 NOT DETECTED  NOT DETECTED Final  . Streptococcus pyogenes 09/12/2015 NOT DETECTED  NOT DETECTED Final  . Acinetobacter baumannii 09/12/2015 NOT DETECTED  NOT DETECTED Final  . Enterobacteriaceae species 09/12/2015 NOT DETECTED  NOT DETECTED Final  . Enterobacter cloacae complex 09/12/2015 NOT DETECTED  NOT DETECTED Final  . Escherichia coli 09/12/2015 NOT DETECTED  NOT DETECTED Final  . Klebsiella oxytoca 09/12/2015 NOT DETECTED  NOT DETECTED Final  . Klebsiella pneumoniae 09/12/2015 NOT DETECTED  NOT DETECTED Final  . Proteus species 09/12/2015 NOT DETECTED  NOT DETECTED Final  . Serratia marcescens 09/12/2015 NOT DETECTED  NOT  DETECTED Final  . Carbapenem resistance 09/12/2015 NOT DETECTED  NOT DETECTED Final  . Haemophilus  influenzae 09/12/2015 NOT DETECTED  NOT DETECTED Final  . Neisseria meningitidis 09/12/2015 NOT DETECTED  NOT DETECTED Final  . Pseudomonas aeruginosa 09/12/2015 NOT DETECTED  NOT DETECTED Final  . Candida albicans 09/12/2015 NOT DETECTED  NOT DETECTED Final  . Candida glabrata 09/12/2015 NOT DETECTED  NOT DETECTED Final  . Candida krusei 09/12/2015 NOT DETECTED  NOT DETECTED Final  . Candida parapsilosis 09/12/2015 NOT DETECTED  NOT DETECTED Final  . Candida tropicalis 09/12/2015 NOT DETECTED  NOT DETECTED Final   Performed at [place] on 09/05/2015  Component Date Value Ref Range Status  . Hemoglobin 08/19/2015 13.4  12.0 - 16.0 g/dL Final  . HCT 08/19/2015 40  36 - 46 % Final  . Neutrophils Absolute 08/19/2015 4148   Final  . Platelets 08/19/2015 198  150 - 399 K/L Final  . WBC 08/19/2015 6.8   Final  . Glucose 08/19/2015 184   Final  . BUN 08/19/2015 14  4 - 21 mg/dL Final  . Creatinine 08/19/2015 1.1  0.5 - 1.1 mg/dL Final  . Potassium 08/19/2015 3.9  3.4 - 5.3 mmol/L Final  . Sodium 08/19/2015 141  137 - 147 mmol/L Final  . Hemoglobin 08/14/2015 13.0  12.0 - 16.0 g/dL Final  . HCT 08/14/2015 40  36 - 46 % Final  . Neutrophils Absolute 08/14/2015 2   Final  . Platelets 08/14/2015 166  150 - 399 K/L Final  . WBC 08/14/2015 4.0   Final  . Glucose 08/14/2015 77   Final  . BUN 08/14/2015 12  4 - 21 mg/dL Final  . Creatinine 08/14/2015 0.8  0.5 - 1.1 mg/dL Final  . Potassium 08/14/2015 3.9  3.4 - 5.3 mmol/L Final  . Sodium 08/14/2015 145  137 - 147 mmol/L Final  Admission on 08/07/2015, Discharged on 08/12/2015  No results displayed because visit has over 200 results.    Admission on 07/31/2015, Discharged on 07/31/2015  Component Date Value Ref Range Status  . WBC 07/31/2015 5.1  4.0 - 10.5 K/uL Final  . RBC 07/31/2015 4.12  3.87 - 5.11 MIL/uL Final  . Hemoglobin 07/31/2015 13.0  12.0 - 15.0 g/dL Final  . HCT 07/31/2015 39.3  36.0 - 46.0 % Final  .  MCV 07/31/2015 95.4  78.0 - 100.0 fL Final  . MCH 07/31/2015 31.6  26.0 - 34.0 pg Final  . MCHC 07/31/2015 33.1  30.0 - 36.0 g/dL Final  . RDW 07/31/2015 14.0  11.5 - 15.5 % Final  . Platelets 07/31/2015 167  150 - 400 K/uL Final  . Neutrophils Relative % 07/31/2015 64   Final  . Neutro Abs 07/31/2015 3.3  1.7 - 7.7 K/uL Final  . Lymphocytes Relative 07/31/2015 22   Final  . Lymphs Abs 07/31/2015 1.1  0.7 - 4.0 K/uL Final  . Monocytes Relative 07/31/2015 11   Final  . Monocytes Absolute 07/31/2015 0.5  0.1 - 1.0 K/uL Final  . Eosinophils Relative 07/31/2015 2   Final  . Eosinophils Absolute 07/31/2015 0.1  0.0 - 0.7 K/uL Final  . Basophils Relative 07/31/2015 1   Final  . Basophils Absolute 07/31/2015 0.0  0.0 - 0.1 K/uL Final  . Sodium 07/31/2015 142  135 - 145 mmol/L Final  . Potassium 07/31/2015 4.1  3.5 - 5.1 mmol/L Final  . Chloride 07/31/2015 106  101 - 111 mmol/L Final  . CO2  07/31/2015 30  22 - 32 mmol/L Final  . Glucose, Bld 07/31/2015 127* 65 - 99 mg/dL Final  . BUN 07/31/2015 20  6 - 20 mg/dL Final  . Creatinine, Ser 07/31/2015 0.88  0.44 - 1.00 mg/dL Final  . Calcium 07/31/2015 8.9  8.9 - 10.3 mg/dL Final  . GFR calc non Af Amer 07/31/2015 57* >60 mL/min Final  . GFR calc Af Amer 07/31/2015 >60  >60 mL/min Final   Comment: (NOTE) The eGFR has been calculated using the CKD EPI equation. This calculation has not been validated in all clinical situations. eGFR's persistently <60 mL/min signify possible Chronic Kidney Disease.   . Anion gap 07/31/2015 6  5 - 15 Final  . Troponin I 07/31/2015 <0.03  <0.031 ng/mL Final   Comment:        NO INDICATION OF MYOCARDIAL INJURY.     Dg Chest 2 View  09/12/2015  CLINICAL DATA:  Weakness.  Confusion and possible sepsis. EXAM: CHEST  2 VIEW COMPARISON:  07/31/2015 FINDINGS: Few hazy densities at the left costophrenic angle appear stable. Otherwise, the lungs are clear. Heart and mediastinum are within normal limits. No pleural  effusions. Mild degenerative changes in the thoracic spine. IMPRESSION: No active cardiopulmonary disease. Electronically Signed   By: Markus Daft M.D.   On: 09/12/2015 13:55   Ct Head Wo Contrast  09/12/2015  CLINICAL DATA:  Left-sided weakness and headache.  Fatigue. EXAM: CT HEAD WITHOUT CONTRAST TECHNIQUE: Contiguous axial images were obtained from the base of the skull through the vertex without intravenous contrast. COMPARISON:  Head CT Aug 07, 2015 and brain MRI Aug 07, 2015 FINDINGS: Moderate diffuse atrophy is stable. There is no intracranial mass, hemorrhage, extra-axial fluid collection, or midline shift. There is small vessel disease throughout the centra semiovale bilaterally, stable. There is a prior infarct in the left mid parietal lobe, stable. There is a small lacunar infarct at the junction of the inferior right centrum semiovale and right lentiform nucleus, stable. There is a prior small lacunar infarct in the superior medial left cerebellum. No new gray-white compartment lesion is identified. No acute infarct is evident. The bony calvarium appears intact. The mastoid air cells are clear. No intraorbital lesions are appreciable. There is extensive arthropathy in the left temporomandibular joint. IMPRESSION: Moderate diffuse atrophy with periventricular small vessel disease as well as prior infarcts as summarized above. No acute infarct is evident. No hemorrhage or mass effect. Extensive left temporomandibular joint arthropathic change. Electronically Signed   By: Lowella Grip III M.D.   On: 09/12/2015 15:16     Assessment/Plan   ICD-9-CM ICD-10-CM   1. OAB (overactive bladder) 596.51 N32.81 darifenacin (ENABLEX) 7.5 MG 24 hr tablet  2. Vascular dementia, without behavioral disturbance 290.40 F01.50   3. Senile osteoporosis 733.01 M81.0 denosumab (PROLIA) injection 60 mg  4. History of CVA (cerebrovascular accident) V12.54 Z86.73   5. Atrial flutter, unspecified type ([place]) 427.32  I48.92   6. Protein-calorie malnutrition (H[facility]) 263.9 E46   7. Essential hypertension 401.9 I10    Take Enablex in the morning and 1 tablet of myrbetriq in the evening for overactive bladder. Other available antimuscarincs on BEERs list  Continue other medications as ordered  Follow up in 2 mos for routine visit  May need urology eval  Cordella Register. Perlie Gold  [facility] [address] [place], [place] [address] [phone] Cell (Monday-Friday 8 AM - 5 PM) [phone]  After 5 PM and follow prompts

## 2015-10-12 DIAGNOSIS — N3281 Overactive bladder: Secondary | ICD-10-CM | POA: Insufficient documentation

## 2015-10-12 DIAGNOSIS — E46 Unspecified protein-calorie malnutrition: Secondary | ICD-10-CM | POA: Insufficient documentation

## 2015-10-12 DIAGNOSIS — F0150 Vascular dementia without behavioral disturbance: Secondary | ICD-10-CM | POA: Insufficient documentation

## 2015-10-13 DIAGNOSIS — G3184 Mild cognitive impairment, so stated: Secondary | ICD-10-CM | POA: Diagnosis not present

## 2015-10-13 DIAGNOSIS — R1312 Dysphagia, oropharyngeal phase: Secondary | ICD-10-CM | POA: Diagnosis not present

## 2015-10-13 DIAGNOSIS — I69354 Hemiplegia and hemiparesis following cerebral infarction affecting left non-dominant side: Secondary | ICD-10-CM | POA: Diagnosis not present

## 2015-10-13 DIAGNOSIS — I10 Essential (primary) hypertension: Secondary | ICD-10-CM | POA: Diagnosis not present

## 2015-10-13 DIAGNOSIS — I4892 Unspecified atrial flutter: Secondary | ICD-10-CM | POA: Diagnosis not present

## 2015-10-13 DIAGNOSIS — I4891 Unspecified atrial fibrillation: Secondary | ICD-10-CM | POA: Diagnosis not present

## 2015-10-13 NOTE — Telephone Encounter (Signed)
Spoke with patients daughter. Referral order placed

## 2015-10-13 NOTE — Telephone Encounter (Signed)
Based on information on from fax the medication is not reimbursable. If patient to take this medication she will have to pay out of pocket.

## 2015-10-13 NOTE — Telephone Encounter (Signed)
Left message on voicemail for patient to return call when available   

## 2015-10-13 NOTE — Telephone Encounter (Signed)
Noted - please refer to Urology for urinary incontinence. Thanks

## 2015-10-14 ENCOUNTER — Telehealth: Payer: Self-pay | Admitting: *Deleted

## 2015-10-14 NOTE — Telephone Encounter (Signed)
Daughter called and stated that she called the [employer] for an appeal for the [employer]. We received a fax Prior Authorization form. Filled out and faxed back to [facility] [phone] Fax: [phone] Daughter notified and agreed.

## 2015-10-16 ENCOUNTER — Encounter: Payer: Self-pay | Admitting: Neurology

## 2015-10-16 ENCOUNTER — Ambulatory Visit (INDEPENDENT_AMBULATORY_CARE_PROVIDER_SITE_OTHER): Payer: Medicare Other | Admitting: Neurology

## 2015-10-16 VITALS — BP 165/67 | HR 69 | Ht 66.0 in | Wt 130.2 lb

## 2015-10-16 DIAGNOSIS — I639 Cerebral infarction, unspecified: Secondary | ICD-10-CM | POA: Diagnosis not present

## 2015-10-16 DIAGNOSIS — G459 Transient cerebral ischemic attack, unspecified: Secondary | ICD-10-CM | POA: Diagnosis not present

## 2015-10-16 NOTE — Patient Instructions (Addendum)
I had a long d/w patient and her 2 daughters about her recent TIA, atrial fibrillation, risk for recurrent stroke/TIAs, personally independently reviewed imaging studies and stroke evaluation results and answered questions.Continue Eliquis (apixaban) daily  for secondary stroke prevention and maintain strict control of hypertension with blood pressure goal below 130/90, diabetes with hemoglobin A1c goal below 6.5% and lipids with LDL cholesterol goal below 70 mg/dL. I also advised the patient to eat a healthy diet  and use a walker and 1 person assist at all times to ambulate to avoid falls and injuries. No routine schedule appointment with me is necessary but she can be referred back in the future as needed.

## 2015-10-16 NOTE — Progress Notes (Signed)
[facility] [address] [place]. [place] [address] [phone]       OFFICE FOLLOW-UP NOTE  [name] Date of Birth:  January 12, 1925 Medical Record Number:  [id number]   HPI: 47 year Caucasian lady seen for first office follow for the following hospital admission on 03/09/13 for left hemiparesis. She has remote history of right brain subcortical infarct in May 2014 felt to be secondary to atrial fibrillation has been on anticoagulation with cerebral since then. She was admitted multiple times with worsening of hemiparesis in the setting of infection or dehydration. She had an MRI in December 2014 which did not reveal any new right brain stroke but showed and he of restricted diffusion which is patchy in the left parietal region. At that time it was not clear whether this was a stroke or not however the patient's daughter was informed today that she had indeed had a fall a month prior in November 2014 that she had sustained scalp hematoma in the left temporal region and had been seen in the ER and a solitary CT scan of the head was unremarkable and she was sent back to the nursing home. In retrospect the MRI diffusion abnormality in the left parietal region in December 2014 they represent a delayed hemorrhage contusion which may have sustained from a fall in November 2014. It was planned to change as there are 2 to a liquids but the family never made the switch as they were unclear as to what the co-pay for a liquids would cost him. The patient seems to be tolerating that well without any further increased bleeding or bruising. She suffered from a bad cold for 2 weeks and had a minor nasal bleed which stopped. She is currently living at home and getting physical and occupational therapy. She is able to walk with a walker fairly well. She is carefully washed over by 3 daughters. She has had some mild memory difficulties but these are not progressive. Update 11/15/2013 : She returns  for followup of her last visit in March 2015. She did benefit from outpatient physical and occupational therapy but was readmitted in May and was found to have a right corpus callosum infarct as well as bladder infection. I have personally reviewed imaging studies and hospital workup. She was on Xarelto which was switched to eliquis 2.5mg  twice daily. She had a prolonged stay in rehabilitation at [facility] and has just finished home physical and occupational therapy recently. She can walk with a walker but does lean forward sent to the right and requires one-person assist. She still has some residual left-sided weakness from her recent stroke. She is impulsive and does not always call for help. The family feels she is unsafe to walk by herself. The patient is quite frustrated about the situation. Update 07/25/2014 : She returns for follow-up after last visit 8 months ago. She is accompanied by 3 of her daughters who reports that they've noticed some cognitive decline and memory loss since last visit. Patient has been in and out of the hospital this last year multiple times with strokes as well as dehydration and bladder infections. She is presently living at home and has 24-hour care. She spends most of the time in a wheelchair and does not have much social interaction and increases to go out. She is able to toe walk with one-person assist. She has some home therapy every day which makes her walk a little bit. She is tolerating liquids without bleeding  bruising or other side effects. She has not had headaches for quite some time and no definite documented seizures last couple of years. She is currently on valproic acid 375 twice daily. Update 10/29/2014 : She returns for follow-up after last visit 4 months ago. She is accompanied by her daughter who states that her memory and cognitive difficulties are about the same but in fact on Mini-Mental status testing today she shows a decline with score of 17/30. Clock  drawing is only 1/4 in am and naming is only 6. The patient has had complaints of nocturia and has to get up several times at night and does not sleep well at night. She has been to a urologist as well as family physician but the testing for infection has been negative. The patient is not very active during the day and does not engage and interaction. There have been no no issues with agitation, delusions or hallucinations. The family is willing to try Aricept but have concerns about possible side effects. They also want to try some natural supplements to help her sleep but her reluctant to take any sleeping pills. Update 02/06/2015 : She returns for f/u after last visit 3 months ago. She was admitted to [facility] on 12/18/14 with worsening weakness and gait difficulties and was found to have UTI and dehydration and was deconditioned.MRI showed no acute infarct. She was transferred to SNF for rehab and has slowly improved but is not participating as much with therapy and barely walks and needs 2 person assist. She at home now with her daughters but does not walk as much as she used to prior to her recent hospitalization.She tried aricept in [place] but did not tolerate it due to GI side effects. Update 10/16/2015 :  She returns for follow-up after last visit 8 months ago. She is accompanied by her 2 daughters. Patient was recently hospitalized in 08/07/15 with worsening of left-sided symptoms. MRI scan of the brain did not show an acute infarct. Respiratory deconditioned following recurrent bouts of recent bladder infections. Patient has now been started on cranberry juice and family feels that may be helping. She was seen in the ER 2 weeks ago and had a positive blood culture. She had been on Keflex she was given 2 doses of IV antibiotics and blood culture repeated the next day was negative and oral antibiotics were continued. She continues to live at home and has 24-hour care provided by her daughters. She is able to walk  with a walker but needs close one-person assist. She continues to have persistent left-sided weakness. She is on eliquis which is tolerating well without bleeding or bruising. She does have an upcoming appointment with a urologist to discuss her increased frequency of urination. ROS:   14 system review of systems is positive for  activity and  , fatigue,  appetite change, eye itching and redness, frequent waking, daytime sleepiness, frequency of urination, bladder urgency, frequent bladder infections, memory loss, headache, weakness, agitation, confusion, behavior problems, hallucinations and all other systems negative PMH:  Past Medical History  Diagnosis Date  . Arthritis   . Hypertension   . Migraine   . Colon polyp   . Hearing difficulty   . Torus palatinus   . Atrial flutter ([place]) January, 2012  . Stroke ([place]) 08/02/12     right lenticular nucleus infarct  . Left leg weakness 02/05/2013  . Chronic anticoagulation   . High cholesterol   . Atrial fibrillation ([place])  on Eliquis Rx  . Mild cognitive impairment with memory loss   . Malnutrition ([place])   . TIA (transient ischemic attack)   . Constipation   . Osteoporosis   . Hemorrhoids   . Left-sided weakness   . Acute encephalopathy   . HLD (hyperlipidemia)   . Acute cystitis without hematuria   . A-fib [facility])     Social History:  Social History   Social History  . Marital Status: Widowed    Spouse Name: N/A  . Number of Children: 3  . Years of Education: college   Occupational History  . editorial work for [employer]    Social History Main Topics  . Smoking status: Former Research scientist (life sciences)  . Smokeless tobacco: Never Used  . Alcohol Use: No     Comment: 08/01/2012 "glass of wine on special occasions"  . Drug Use: No  . Sexual Activity: No   Other Topics Concern  . Not on file   Social History Narrative   Patient lives at home with daughters.   Caffeine use: 1/2-1 cup of coffee occasionally         Diet: Regular       Do you drink/ eat things with caffeine? Yes      Marital status:   Widowed                            What year were you married ?       Do you live in a house, apartment,assistred living, condo, trailer, etc.)? House      Is it one or more stories? Yes      How many persons live in your home ? 4      Do you have any pets in your home ?(please list) Yes/ 1 Dog, 2 Cats      Current or past profession: Editor      Do you exercise? No                             Type & how often:      Do you have a living will? Yes      Do you have a DNR form?  Yes                     If not, do you want to discuss one?       Do you have signed POA?HPOA forms? Yes                If so, please bring to your        appointment      regular    Medications:   Current Outpatient Prescriptions on File Prior to Visit  Medication Sig Dispense Refill  . acetaminophen (TYLENOL) 325 MG tablet Take 2 tablets (650 mg total) by mouth every 4 (four) hours as needed for mild pain (or temp >/= 99.5 F).    Marland Kitchen b complex vitamins tablet Take 1 tablet by mouth 2 (two) times daily.     . Calcium Carb-Cholecalciferol (CALCIUM 600 + D) 600-200 MG-UNIT TABS Take 1 tablet by mouth 2 (two) times daily.    . cetirizine (ZYRTEC) 10 MG tablet Take 10 mg by mouth at bedtime.    . Cholecalciferol (VITAMIN D3) 2000 units TABS Take 1 tablet by mouth daily.    . Cranberry 250 MG CAPS Take 2  capsules by mouth daily.     . divalproex (DEPAKOTE) 125 MG DR tablet Take 2 tablets (250 mg total) by mouth 2 (two) times daily. 540 tablet 1  . Docusate Sodium (COLACE PO) Take 100 mg by mouth 2 (two) times daily.     Marland Kitchen ELIQUIS 2.5 MG TABS tablet TAKE 1 TABLET BY MOUTH TWICE DAILY 180 tablet 1  . hydrocortisone (ANUSOL-HC) 2.5 % rectal cream Place 1 application rectally as needed for hemorrhoids or itching.    . hydrocortisone (ANUSOL-HC) 25 MG suppository Place 25 mg rectally as needed for hemorrhoids or itching.    Marland Kitchen LORazepam (ATIVAN)  0.5 MG tablet Take 1 tablet (0.5 mg total) by mouth every 8 (eight) hours as needed for anxiety. 60 tablet 3  . Melatonin 3 MG TABS Take 1 tablet by mouth at bedtime    . mirabegron ER (MYRBETRIQ) 50 MG TB24 tablet Take 1 tablet (50 mg total) by mouth daily. 30 tablet 6  . Multiple Vitamin (MULTIVITAMIN WITH MINERALS) TABS Take 1 tablet by mouth daily.    Marland Kitchen olopatadine (PATANOL) 0.1 % ophthalmic solution Place 1 drop into both eyes 2 (two) times daily. 5 mL 1  . ondansetron (ZOFRAN) 4 MG tablet Take 1 tablet (4 mg total) by mouth every 6 (six) hours as needed for nausea. 10 tablet 0  . darifenacin (ENABLEX) 7.5 MG 24 hr tablet Take 1 tablet (7.5 mg total) by mouth daily. Take in AM (Patient not taking: Reported on 10/16/2015) 30 tablet 3   No current facility-administered medications on file prior to visit.    Allergies:   Allergies  Allergen Reactions  . Aricept [Donepezil Hcl] Hypertension    With N/V  . Tramadol Hcl Other (See Comments)     lethargy, nausea  . Alendronate Sodium Rash and Other (See Comments)    puffy eyes    Physical Exam General: Frail malnourished looking elderly Caucasian lady seated, in no evident distress Head: head normocephalic and atraumatic.   Neck: supple with no carotid or supraclavicular bruits Cardiovascular: regular rate and rhythm, no murmurs Musculoskeletal: no deformity Skin:  no rash/petichiae Vascular:  Normal pulses all extremities Filed Vitals:   10/16/15 1455  BP: 165/67  Pulse: 69   Neurologic Exam Mental Status: Awake and fully alert. Oriented to place and time. Recent and remote memory poor. Attention span, concentration and fund of knowledge slightly diminished. Mood and affect appropriate. Mini-Mental status exam not done but has deficits in orientation, attention, calculation, recall.    Cranial Nerves: Fundoscopic exam not done . Pupils equal, briskly reactive to light. Extraocular movements full without nystagmus. Visual fields  full to confrontation. Hearing diminished slightly bilaterally.. Facial sensation intact. Face, tongue, palate moves normally and symmetrically.  Motor: Normal bulk and tone. Normal strength in all tested extremity muscles except mild weakness of left grip and intrinsic hand muscles. Mild left hemiparesis 4/5. Mild drift left upper and lower extremity Diminished fine finger movements on the left. Orbits right over left upper extremity. Sensory.: intact to touch and pinprick and vibratory sensation.  Coordination: Rapid alternating movements normal in all extremities. Finger-to-nose and heel-to-shin performed accurately bilaterally. Gait and Station: Deferred as patient sitting in wheelchair and fall risk  Reflexes: 1+ and symmetric. Toes downgoing.      ASSESSMENT: 59 year with remote history of right hemispheric subcortical infarct in May 2014 with recent admission in  May 2015 with new right corpus callosal embolic infarct from atrial fibrillation. She has residual mild  left hemiparesis and poor balance and gait difficulties. New worsening gait difficulties likely due to deconditioning from recent hospitalization PLAN: I had a long d/w patient and her 2 daughters about her recent TIA, atrial fibrillation, risk for recurrent stroke/TIAs, personally independently reviewed imaging studies and stroke evaluation results and answered questions.Continue Eliquis (apixaban) daily  for secondary stroke prevention and maintain strict control of hypertension with blood pressure goal below 130/90, diabetes with hemoglobin A1c goal below 6.5% and lipids with LDL cholesterol goal below 70 mg/dL. I also advised the patient to eat a healthy diet  and use a walker and 1 person assist at all times to ambulate to avoid falls and injuries. Greater than 50% time during this 25 minute visit was spent on counseling and coordination of care about stroke risk, prevention and treatment No routine schedule appointment with me is  necessary but she can be referred back in the future as needed.   Antony Contras, MD   Note: This document was prepared with digital dictation and possible smart phrase technology. Any transcriptional errors that result from this process are unintentional

## 2015-10-17 DIAGNOSIS — I69354 Hemiplegia and hemiparesis following cerebral infarction affecting left non-dominant side: Secondary | ICD-10-CM | POA: Diagnosis not present

## 2015-10-17 DIAGNOSIS — I4891 Unspecified atrial fibrillation: Secondary | ICD-10-CM | POA: Diagnosis not present

## 2015-10-17 DIAGNOSIS — I10 Essential (primary) hypertension: Secondary | ICD-10-CM | POA: Diagnosis not present

## 2015-10-17 DIAGNOSIS — R1312 Dysphagia, oropharyngeal phase: Secondary | ICD-10-CM | POA: Diagnosis not present

## 2015-10-17 DIAGNOSIS — G3184 Mild cognitive impairment, so stated: Secondary | ICD-10-CM | POA: Diagnosis not present

## 2015-10-17 DIAGNOSIS — I4892 Unspecified atrial flutter: Secondary | ICD-10-CM | POA: Diagnosis not present

## 2015-10-21 DIAGNOSIS — I69354 Hemiplegia and hemiparesis following cerebral infarction affecting left non-dominant side: Secondary | ICD-10-CM | POA: Diagnosis not present

## 2015-10-21 DIAGNOSIS — I4891 Unspecified atrial fibrillation: Secondary | ICD-10-CM | POA: Diagnosis not present

## 2015-10-21 DIAGNOSIS — G3184 Mild cognitive impairment, so stated: Secondary | ICD-10-CM | POA: Diagnosis not present

## 2015-10-21 DIAGNOSIS — R1312 Dysphagia, oropharyngeal phase: Secondary | ICD-10-CM | POA: Diagnosis not present

## 2015-10-21 DIAGNOSIS — I10 Essential (primary) hypertension: Secondary | ICD-10-CM | POA: Diagnosis not present

## 2015-10-21 DIAGNOSIS — I4892 Unspecified atrial flutter: Secondary | ICD-10-CM | POA: Diagnosis not present

## 2015-10-24 DIAGNOSIS — I4892 Unspecified atrial flutter: Secondary | ICD-10-CM | POA: Diagnosis not present

## 2015-10-24 DIAGNOSIS — R1312 Dysphagia, oropharyngeal phase: Secondary | ICD-10-CM | POA: Diagnosis not present

## 2015-10-24 DIAGNOSIS — I10 Essential (primary) hypertension: Secondary | ICD-10-CM | POA: Diagnosis not present

## 2015-10-24 DIAGNOSIS — G3184 Mild cognitive impairment, so stated: Secondary | ICD-10-CM | POA: Diagnosis not present

## 2015-10-24 DIAGNOSIS — I4891 Unspecified atrial fibrillation: Secondary | ICD-10-CM | POA: Diagnosis not present

## 2015-10-24 DIAGNOSIS — I69354 Hemiplegia and hemiparesis following cerebral infarction affecting left non-dominant side: Secondary | ICD-10-CM | POA: Diagnosis not present

## 2015-10-27 ENCOUNTER — Other Ambulatory Visit: Payer: Self-pay | Admitting: Internal Medicine

## 2015-10-27 ENCOUNTER — Ambulatory Visit: Payer: Medicare Other | Admitting: Gastroenterology

## 2015-10-27 MED FILL — OLOPATADINE HCL 0.1% EYE DR: 0.1 | 30 days supply | Qty: 5 | Fill #0

## 2015-11-03 ENCOUNTER — Other Ambulatory Visit: Payer: Self-pay | Admitting: Neurology

## 2015-11-03 MED FILL — MYRBETRIQ ER 50 MG TABLET: 50 | 30 days supply | Qty: 30 | Fill #1

## 2015-11-04 ENCOUNTER — Other Ambulatory Visit: Payer: Self-pay

## 2015-11-04 MED ORDER — DIVALPROEX SODIUM 125 MG PO DR TAB: 250.0000 mg | DELAYED_RELEASE_TABLET | Freq: Two times a day (BID) | ORAL | 1 refills | Status: DC

## 2015-11-18 ENCOUNTER — Other Ambulatory Visit: Payer: Self-pay | Admitting: Neurology

## 2015-11-20 ENCOUNTER — Telehealth: Payer: Self-pay | Admitting: Neurology

## 2015-11-20 ENCOUNTER — Other Ambulatory Visit: Payer: Self-pay

## 2015-11-20 MED ORDER — APIXABAN 2.5 MG PO TABS: 2.5000 mg | ORAL_TABLET | Freq: Two times a day (BID) | ORAL | 0 refills | Status: DC

## 2015-11-20 MED FILL — ELIQUIS 2.5 MG TABLET: 2.5 | 90 days supply | Qty: 180 | Fill #0

## 2015-11-20 NOTE — Telephone Encounter (Signed)
Patient returned call, states received call twice and twice call disconnected.

## 2015-11-20 NOTE — Telephone Encounter (Signed)
LFt vm for Kim pts daughter that 3 month eliquis refill has been done. Future refills need to be forward to PCP.

## 2015-11-20 NOTE — Telephone Encounter (Signed)
Patient requesting refill of   ELIQUIS 2.5 MG TABS tablet    Pharmacy: please send to [place], [place]

## 2015-11-23 ENCOUNTER — Emergency Department (HOSPITAL_COMMUNITY): Payer: Medicare Other

## 2015-11-23 ENCOUNTER — Encounter (HOSPITAL_COMMUNITY): Payer: Self-pay | Admitting: Emergency Medicine

## 2015-11-23 ENCOUNTER — Inpatient Hospital Stay (HOSPITAL_COMMUNITY)
Admission: EM | Admit: 2015-11-23 | Discharge: 2015-11-26 | DRG: 065 | Disposition: A | Discharge status: Skilled Nursing Facility | Payer: Medicare Other | Attending: Internal Medicine | Admitting: Internal Medicine

## 2015-11-23 DIAGNOSIS — R531 Weakness: Secondary | ICD-10-CM | POA: Diagnosis not present

## 2015-11-23 DIAGNOSIS — Z79899 Other long term (current) drug therapy: Secondary | ICD-10-CM

## 2015-11-23 DIAGNOSIS — R1110 Vomiting, unspecified: Secondary | ICD-10-CM | POA: Diagnosis not present

## 2015-11-23 DIAGNOSIS — I6789 Other cerebrovascular disease: Secondary | ICD-10-CM | POA: Diagnosis not present

## 2015-11-23 DIAGNOSIS — M6281 Muscle weakness (generalized): Secondary | ICD-10-CM | POA: Diagnosis present

## 2015-11-23 DIAGNOSIS — R29810 Facial weakness: Secondary | ICD-10-CM | POA: Diagnosis not present

## 2015-11-23 DIAGNOSIS — G8191 Hemiplegia, unspecified affecting right dominant side: Secondary | ICD-10-CM | POA: Diagnosis present

## 2015-11-23 DIAGNOSIS — M810 Age-related osteoporosis without current pathological fracture: Secondary | ICD-10-CM | POA: Diagnosis present

## 2015-11-23 DIAGNOSIS — F0390 Unspecified dementia without behavioral disturbance: Secondary | ICD-10-CM | POA: Diagnosis present

## 2015-11-23 DIAGNOSIS — I4891 Unspecified atrial fibrillation: Secondary | ICD-10-CM

## 2015-11-23 DIAGNOSIS — Z8673 Personal history of transient ischemic attack (TIA), and cerebral infarction without residual deficits: Secondary | ICD-10-CM | POA: Diagnosis not present

## 2015-11-23 DIAGNOSIS — Z87891 Personal history of nicotine dependence: Secondary | ICD-10-CM | POA: Diagnosis not present

## 2015-11-23 DIAGNOSIS — M150 Primary generalized (osteo)arthritis: Secondary | ICD-10-CM | POA: Diagnosis not present

## 2015-11-23 DIAGNOSIS — I63422 Cerebral infarction due to embolism of left anterior cerebral artery: Principal | ICD-10-CM | POA: Diagnosis present

## 2015-11-23 DIAGNOSIS — G459 Transient cerebral ischemic attack, unspecified: Secondary | ICD-10-CM

## 2015-11-23 DIAGNOSIS — Z823 Family history of stroke: Secondary | ICD-10-CM | POA: Diagnosis not present

## 2015-11-23 DIAGNOSIS — Z7901 Long term (current) use of anticoagulants: Secondary | ICD-10-CM | POA: Diagnosis not present

## 2015-11-23 DIAGNOSIS — E785 Hyperlipidemia, unspecified: Secondary | ICD-10-CM | POA: Diagnosis not present

## 2015-11-23 DIAGNOSIS — R32 Unspecified urinary incontinence: Secondary | ICD-10-CM | POA: Diagnosis present

## 2015-11-23 DIAGNOSIS — I480 Paroxysmal atrial fibrillation: Secondary | ICD-10-CM | POA: Diagnosis present

## 2015-11-23 DIAGNOSIS — I69354 Hemiplegia and hemiparesis following cerebral infarction affecting left non-dominant side: Secondary | ICD-10-CM | POA: Diagnosis not present

## 2015-11-23 DIAGNOSIS — G3184 Mild cognitive impairment, so stated: Secondary | ICD-10-CM | POA: Diagnosis present

## 2015-11-23 DIAGNOSIS — Z5189 Encounter for other specified aftercare: Secondary | ICD-10-CM | POA: Diagnosis present

## 2015-11-23 DIAGNOSIS — I6990 Unspecified sequelae of unspecified cerebrovascular disease: Secondary | ICD-10-CM | POA: Diagnosis present

## 2015-11-23 DIAGNOSIS — Z87440 Personal history of urinary (tract) infections: Secondary | ICD-10-CM

## 2015-11-23 DIAGNOSIS — I639 Cerebral infarction, unspecified: Secondary | ICD-10-CM

## 2015-11-23 DIAGNOSIS — I10 Essential (primary) hypertension: Secondary | ICD-10-CM | POA: Diagnosis not present

## 2015-11-23 DIAGNOSIS — I4892 Unspecified atrial flutter: Secondary | ICD-10-CM | POA: Diagnosis present

## 2015-11-23 DIAGNOSIS — Z888 Allergy status to other drugs, medicaments and biological substances status: Secondary | ICD-10-CM

## 2015-11-23 DIAGNOSIS — R51 Headache: Secondary | ICD-10-CM | POA: Diagnosis not present

## 2015-11-23 DIAGNOSIS — R488 Other symbolic dysfunctions: Secondary | ICD-10-CM | POA: Diagnosis not present

## 2015-11-23 DIAGNOSIS — Z66 Do not resuscitate: Secondary | ICD-10-CM | POA: Diagnosis present

## 2015-11-23 DIAGNOSIS — M6289 Other specified disorders of muscle: Secondary | ICD-10-CM | POA: Diagnosis not present

## 2015-11-23 DIAGNOSIS — G4089 Other seizures: Secondary | ICD-10-CM | POA: Diagnosis not present

## 2015-11-23 DIAGNOSIS — R1312 Dysphagia, oropharyngeal phase: Secondary | ICD-10-CM | POA: Diagnosis not present

## 2015-11-23 DIAGNOSIS — I69154 Hemiplegia and hemiparesis following nontraumatic intracerebral hemorrhage affecting left non-dominant side: Secondary | ICD-10-CM | POA: Diagnosis present

## 2015-11-23 LAB — DIFFERENTIAL
BASOS ABS: 0 10*3/uL (ref 0.0–0.1)
Basophils Relative: 1 %
EOS PCT: 4 %
Eosinophils Absolute: 0.2 10*3/uL (ref 0.0–0.7)
LYMPHS ABS: 1.3 10*3/uL (ref 0.7–4.0)
LYMPHS PCT: 28 %
Monocytes Absolute: 0.7 10*3/uL (ref 0.1–1.0)
Monocytes Relative: 14 %
NEUTROS ABS: 2.6 10*3/uL (ref 1.7–7.7)
NEUTROS PCT: 53 %

## 2015-11-23 LAB — CBC
HCT: 38.9 % (ref 36.0–46.0)
HEMOGLOBIN: 12.3 g/dL (ref 12.0–15.0)
MCH: 30.5 pg (ref 26.0–34.0)
MCHC: 31.6 g/dL (ref 30.0–36.0)
MCV: 96.5 fL (ref 78.0–100.0)
PLATELETS: 183 10*3/uL (ref 150–400)
RBC: 4.03 MIL/uL (ref 3.87–5.11)
RDW: 15 % (ref 11.5–15.5)
WBC: 4.9 10*3/uL (ref 4.0–10.5)

## 2015-11-23 LAB — I-STAT CHEM 8, ED
BUN: 17 mg/dL (ref 6–20)
CALCIUM ION: 1.12 mmol/L (ref 1.12–1.23)
CHLORIDE: 106 mmol/L (ref 101–111)
Creatinine, Ser: 1.1 mg/dL — ABNORMAL HIGH (ref 0.44–1.00)
GLUCOSE: 167 mg/dL — AB (ref 65–99)
HCT: 37 % (ref 36.0–46.0)
Hemoglobin: 12.6 g/dL (ref 12.0–15.0)
POTASSIUM: 4.3 mmol/L (ref 3.5–5.1)
SODIUM: 144 mmol/L (ref 135–145)
TCO2: 28 mmol/L (ref 0–100)

## 2015-11-23 LAB — COMPREHENSIVE METABOLIC PANEL
ALBUMIN: 3.3 g/dL — AB (ref 3.5–5.0)
ALK PHOS: 61 U/L (ref 38–126)
ALT: 13 U/L — ABNORMAL LOW (ref 14–54)
ANION GAP: 7 (ref 5–15)
AST: 24 U/L (ref 15–41)
BILIRUBIN TOTAL: 0.4 mg/dL (ref 0.3–1.2)
BUN: 15 mg/dL (ref 6–20)
CALCIUM: 8.4 mg/dL — AB (ref 8.9–10.3)
CO2: 24 mmol/L (ref 22–32)
Chloride: 106 mmol/L (ref 101–111)
Creatinine, Ser: 1.04 mg/dL — ABNORMAL HIGH (ref 0.44–1.00)
GFR calc Af Amer: 54 mL/min — ABNORMAL LOW (ref >60)
GFR calc non Af Amer: 46 mL/min — ABNORMAL LOW (ref >60)
GLUCOSE: 170 mg/dL — AB (ref 65–99)
Potassium: 4.1 mmol/L (ref 3.5–5.1)
SODIUM: 137 mmol/L (ref 135–145)
TOTAL PROTEIN: 6 g/dL — AB (ref 6.5–8.1)

## 2015-11-23 LAB — I-STAT TROPONIN, ED: Troponin i, poc: 0.01 ng/mL (ref 0.00–0.08)

## 2015-11-23 LAB — CBG MONITORING, ED: GLUCOSE-CAPILLARY: 154 mg/dL — AB (ref 65–99)

## 2015-11-23 LAB — PROTIME-INR
INR: 1.07
PROTHROMBIN TIME: 13.9 s (ref 11.4–15.2)

## 2015-11-23 LAB — APTT: APTT: 28 s (ref 24–36)

## 2015-11-23 NOTE — Code Documentation (Signed)
Patient last known well by family at 19.  EMS called because she was not "acting right".  Per EMS patient with right side weakness and HA.  Arrived at 2052, labs done and stat head CT done.  Dr Nicole Kindred at bedside to assess patient.  Patient denies HA.  NIHSS 0.  When identifying/describing pictures she had to be refocused to complete page but she was able to identify images.  Per Dr Nicole Kindred, TIA alert, plan admit.

## 2015-11-23 NOTE — ED Triage Notes (Signed)
Pt in EMS from home, per family LNW 1930, pt had dinner and "started acting weird" Per EMS pt had R sided weakness. C/O HA. Pt has hx stroke.

## 2015-11-23 NOTE — Consult Note (Signed)
Admission H&P    Chief Complaint: New onset right-sided weakness and headache.  HPI: [name] is an 82 y.o. female with a history of atrial fibrillation on anticoagulation, hypertension, stroke with residual left-sided weakness, and hyperlipidemia brought to the emergency room following acute onset of weakness involving her right side. She also complained of left supraorbital headache. No facial droop was reported. There was no slurring of speech. Patient has been taking Eliquis for anticoagulation. CT scan of the head showed no acute intracranial abnormality. Headache as well as right-sided weakness subsequently resolved. NIH stroke score at the time of this evaluation was 0.  LSN: 7:30 PM on 11/23/2015 tPA Given: No: Deficits resolved mRankin:  Past Medical History:  Diagnosis Date  . A-fib ([place])   . Acute cystitis without hematuria   . Acute encephalopathy   . Arthritis   . Atrial fibrillation ([place])    on Eliquis Rx  . Atrial flutter ([place]) January, 2012  . Chronic anticoagulation   . Colon polyp   . Constipation   . Hearing difficulty   . Hemorrhoids   . High cholesterol   . HLD (hyperlipidemia)   . Hypertension   . Left leg weakness 02/05/2013  . Left-sided weakness   . Malnutrition ([place])   . Migraine   . Mild cognitive impairment with memory loss   . Osteoporosis   . Stroke ([place]) 08/02/12    right lenticular nucleus infarct  . TIA (transient ischemic attack)   . Torus palatinus     Past Surgical History:  Procedure Laterality Date  . APPENDECTOMY  02/19/53   `  . CARDIOVERSION  05/19/2010   Dr. Einar Gip  . CATARACT EXTRACTION W/ INTRAOCULAR LENS  IMPLANT, BILATERAL  2006-2008  . GANGLION CYST EXCISION Bilateral 1938,1954,2003,2005   "wrists/hand" (08/01/2012)  . MOUTH SURGERY     [name]  . TONSILLECTOMY  ~ 1935    Family History  Problem Relation Age of Onset  . Cancer Mother     breast  . Aneurysm Mother     brain  . Stroke Mother   . Colon cancer Father    . Breast cancer    . Brain cancer    . Heart attack Neg Hx   . Diabetes Neg Hx   . Hypertension Neg Hx    Social History:  reports that she has quit smoking. She has never used smokeless tobacco. She reports that she does not drink alcohol or use drugs.  Allergies:  Allergies  Allergen Reactions  . Aricept [Donepezil Hcl] Hypertension    With N/V  . Tramadol Hcl Other (See Comments)     lethargy, nausea  . Alendronate Sodium Rash and Other (See Comments)    puffy eyes    Medications: Pre-existing medications were reviewed by me.  ROS: History obtained from the patient  General ROS: negative for - chills, fatigue, fever, night sweats, weight gain or weight loss Psychological ROS: negative for - behavioral disorder, hallucinations, memory difficulties, mood swings or suicidal ideation Ophthalmic ROS: negative for - blurry vision, double vision, eye pain or loss of vision ENT ROS: negative for - epistaxis, nasal discharge, oral lesions, sore throat, tinnitus or vertigo Allergy and Immunology ROS: negative for - hives or itchy/watery eyes Hematological and Lymphatic ROS: negative for - bleeding problems, bruising or swollen lymph nodes Endocrine ROS: negative for - galactorrhea, hair pattern changes, polydipsia/polyuria or temperature intolerance Respiratory ROS: negative for - cough, hemoptysis, shortness of breath or wheezing Cardiovascular ROS: negative  for - chest pain, dyspnea on exertion, edema or irregular heartbeat Gastrointestinal ROS: negative for - abdominal pain, diarrhea, hematemesis, nausea/vomiting or stool incontinence Genito-Urinary ROS: negative for - dysuria, hematuria, incontinence or urinary frequency/urgency Musculoskeletal ROS: negative for - joint swelling or muscular weakness Neurological ROS: as noted in HPI Dermatological ROS: negative for rash and skin lesion changes  Physical Examination: Blood pressure (!) 150/49, pulse 77, temperature 98 F (36.7  C), temperature source Oral, resp. rate 21, height 5\' 6"  (1.676 m), weight 56.1 kg (123 lb 10.9 oz), SpO2 97 %.  HEENT-  Normocephalic, no lesions, without obvious abnormality.  Normal external eye and conjunctiva.  Normal TM's bilaterally.  Normal auditory canals and external ears. Normal external nose, mucus membranes and septum.  Normal pharynx. Neck supple with no masses, nodes, nodules or enlargement. Cardiovascular - regular rate and rhythm, S1, S2 normal, no murmur, click, rub or gallop Lungs - chest clear, no wheezing, rales, normal symmetric air entry Abdomen - soft, non-tender; bowel sounds normal; no masses,  no organomegaly Extremities - no joint deformities, effusion, or inflammation and no edema  Neurologic Examination: Mental Status: Alert, oriented, no acute distress.  Speech fluent without evidence of aphasia. Able to follow commands without difficulty. Cranial Nerves: II-Visual fields were normal. III/IV/VI-Pupils were equal and reacted normally to light. Extraocular movements were full and conjugate.    V/VII-no facial numbness and no facial weakness. VIII-normal. X-normal speech. XI: trapezius strength/neck flexion strength normal bilaterally XII-midline tongue extension with normal strength. Motor: 5/5 bilaterally with normal tone and bulk Sensory: Normal throughout. Deep Tendon Reflexes: 1+ and symmetric. Plantars: Flexor bilaterally Cerebellar: Normal finger-to-nose testing. Carotid auscultation: Normal  Results for orders placed or performed during the hospital encounter of 11/23/15 (from the past 48 hour(s))  CBC     Status: None   Collection Time: 11/23/15  8:55 PM  Result Value Ref Range   WBC 4.9 4.0 - 10.5 K/uL   RBC 4.03 3.87 - 5.11 MIL/uL   Hemoglobin 12.3 12.0 - 15.0 g/dL   HCT 38.9 36.0 - 46.0 %   MCV 96.5 78.0 - 100.0 fL   MCH 30.5 26.0 - 34.0 pg   MCHC 31.6 30.0 - 36.0 g/dL   RDW 15.0 11.5 - 15.5 %   Platelets 183 150 - 400 K/uL   Differential     Status: None   Collection Time: 11/23/15  8:55 PM  Result Value Ref Range   Neutrophils Relative % 53 %   Neutro Abs 2.6 1.7 - 7.7 K/uL   Lymphocytes Relative 28 %   Lymphs Abs 1.3 0.7 - 4.0 K/uL   Monocytes Relative 14 %   Monocytes Absolute 0.7 0.1 - 1.0 K/uL   Eosinophils Relative 4 %   Eosinophils Absolute 0.2 0.0 - 0.7 K/uL   Basophils Relative 1 %   Basophils Absolute 0.0 0.0 - 0.1 K/uL  CBG monitoring, ED     Status: Abnormal   Collection Time: 11/23/15  9:11 PM  Result Value Ref Range   Glucose-Capillary 154 (H) 65 - 99 mg/dL  I-Stat Chem 8, ED     Status: Abnormal   Collection Time: 11/23/15  9:16 PM  Result Value Ref Range   Sodium 144 135 - 145 mmol/L   Potassium 4.3 3.5 - 5.1 mmol/L   Chloride 106 101 - 111 mmol/L   BUN 17 6 - 20 mg/dL   Creatinine, Ser 1.10 (H) 0.44 - 1.00 mg/dL   Glucose, Bld 167 (H) 65 -  99 mg/dL   Calcium, Ion 1.12 1.12 - 1.23 mmol/L   TCO2 28 0 - 100 mmol/L   Hemoglobin 12.6 12.0 - 15.0 g/dL   HCT 37.0 36.0 - 46.0 %   Ct Head Code Stroke W/o Cm  Result Date: 11/23/2015 CLINICAL DATA:  Code stroke. 82 y/o F; right-sided weakness, headache, and left thigh pain. History of cerebral vascular accident. EXAM: CT HEAD WITHOUT CONTRAST TECHNIQUE: Contiguous axial images were obtained from the base of the skull through the vertex without intravenous contrast. COMPARISON:  09/12/2015 CT head.  08/07/2015 MRI brain. FINDINGS: No large acute infarct, focal mass effect, intracranial hemorrhage, hydrocephalus, or hyperdense vessel is identified. Nonspecific confluent hypoattenuation of subcortical and periventricular white matter corresponds to FLAIR signal abnormality on prior MR and is stable, likely representing moderate chronic microvascular ischemic changes. Mild volume loss for age. Chronic right lentiform nucleus, right cerebellar hemisphere, and left parietal infarcts are stable. Visualized paranasal sinuses and mastoids are normally  aerated. Orbits are unremarkable. Bilateral intra-ocular lens replacement. Extensive calcific atherosclerosis of cavernous internal carotid arteries. Calvarium is unremarkable. ASPECTS [facility] Stroke Program Early CT Score) - Ganglionic level infarction (caudate, lentiform nuclei, internal capsule, insula, M1-M3 cortex): 7 - Supraganglionic infarction (M4-M6 cortex): 3 Total score (0-10 with 10 being normal): 10 IMPRESSION: 1. No acute intracranial abnormality is identified. If symptoms persist or if clinically indicated MRI is more sensitive for acute stroke. 2. ASPECTS is 10 3. Stable chronic microvascular ischemic changes, parenchymal volume loss, and chronic infarcts in the right lentiform nucleus, left parietal lobe, and right cerebellum. These results were called by telephone at the time of interpretation on 11/23/2015 at 9:08 pm to Dr. Charlesetta Shanks , who verbally acknowledged these results. Electronically Signed   By: Kristine Garbe M.D.   On: 11/23/2015 21:09    Assessment: 82 y.o. female with multiple risk factors for stroke as well as previous stroke presenting with transient ischemic attack, or possible recurrent acute cerebral infarction.  Stroke Risk Factors - atrial fibrillation, hyperlipidemia and hypertension  Plan: 1. HgbA1c, fasting lipid panel 2. MRI of the brain without contrast 3. PT consult, OT consult 4. Prophylactic therapy-Anticoagulation: Eliquis 5. Risk factor modification 6. Telemetry monitoring  C.R. Nicole Kindred, MD [facility] [phone]  11/23/2015, 9:20 PM

## 2015-11-23 NOTE — ED Notes (Signed)
Pt to MRI

## 2015-11-23 NOTE — ED Provider Notes (Addendum)
[place] DEPT Provider Note   CSN: [id number] Arrival date & time: 11/23/15  2051   An emergency department physician performed an initial assessment on this suspected stroke patient at 2052.  History   Chief Complaint Chief Complaint  Patient presents with  . Code Stroke    HPI [name] is a 82 y.o. female.  HPI Patient had been at baseline as evening. She ate dinner with her daughters. After dinner she was sitting at the couch and her daughter brought her her medications to take. She reports that she just seemed blank and did not really acknowledge her. She wasn't able to pick up her medications. She was not speaking. She then started to laugh inappropriately continuously. She was calling all of her daughters by the name "JoJo". One of the daughters is named "JoJo" but she was calling all them by the same name. He reported that time she seemed to have difficulty using her right arm. With prior stroke she has had right-sided weakness. Now at time of reevaluation, the patient is very calm and quiet. Daughters report this is completely inappropriate for her. Normally when she is in the emergency department she is asking very many questions and making many requests. She has been at baseline state of health without any recent illness. Past Medical History:  Diagnosis Date  . A-fib ([place])   . Acute cystitis without hematuria   . Acute encephalopathy   . Arthritis   . Atrial fibrillation ([place])    on Eliquis Rx  . Atrial flutter ([place]) January, 2012  . Chronic anticoagulation   . Colon polyp   . Constipation   . Hearing difficulty   . Hemorrhoids   . High cholesterol   . HLD (hyperlipidemia)   . Hypertension   . Left leg weakness 02/05/2013  . Left-sided weakness   . Malnutrition ([place])   . Migraine   . Mild cognitive impairment with memory loss   . Osteoporosis   . Stroke ([place]) 08/02/12    right lenticular nucleus infarct  . TIA (transient ischemic attack)   . Torus  palatinus     Patient Active Problem List   Diagnosis Date Noted  . Vascular dementia 10/12/2015  . OAB (overactive bladder) 10/12/2015  . Protein-calorie malnutrition ([place]) 10/12/2015  . Senile osteoporosis 10/10/2015  . Acute encephalopathy 08/12/2015  . Acute cystitis without hematuria   . HLD (hyperlipidemia)   . External hemorrhoids without complication [id number]  . Abdominal pain 12/18/2014  . Nausea without vomiting 12/18/2014  . UTI (lower urinary tract infection) 12/18/2014  . Urinary tract infectious disease   . Chest pain 10/31/2014  . Nausea and vomiting 10/31/2014  . Nausea with vomiting   . Mild cognitive impairment with memory loss 07/25/2014  . Malnourished ([place]) 07/25/2014  . Recurrent UTI 08/07/2013  . History of CVA (cerebrovascular accident) 06/02/2013  . Unspecified constipation 03/16/2013  . Left-sided weakness 03/09/2013  . Possible Seizures 02/14/2013  . TIA (transient ischemic attack) 02/12/2013  . Hyperglycemia 02/12/2013  . High cholesterol   . Atrial fibrillation ([place]) 02/05/2013  . Chronic anticoagulation 02/05/2013  . HTN (hypertension) 11/14/2012  . Right ureteral stone 09/15/2010  . Atrial flutter ([place]) 07/17/2010  . Osteopenia of the elderly 06/03/2009  . Migraine 02/05/2008  . CATARACTS, BILATERAL 02/05/2008  . DECREASED HEARING 02/05/2008  . GEN OSTEOARTHROSIS INVOLVING MULTIPLE SITES 02/05/2008  . COLONIC POLYPS, HX OF 02/05/2008    Past Surgical History:  Procedure Laterality Date  . APPENDECTOMY  02/19/53   `  . CARDIOVERSION  05/19/2010   Dr. Einar Gip  . CATARACT EXTRACTION W/ INTRAOCULAR LENS  IMPLANT, BILATERAL  2006-2008  . GANGLION CYST EXCISION Bilateral 1938,1954,2003,2005   "wrists/hand" (08/01/2012)  . MOUTH SURGERY     [name]  . TONSILLECTOMY  ~ 1935    OB History    No data available       Home Medications    Prior to Admission medications   Medication Sig Start Date End Date Taking? Authorizing Provider    acetaminophen (TYLENOL) 325 MG tablet Take 2 tablets (650 mg total) by mouth every 4 (four) hours as needed for mild pain (or temp >/= 99.5 F). 08/12/15  Yes Lavina Hamman, MD  apixaban (ELIQUIS) 2.5 MG TABS tablet Take 1 tablet (2.5 mg total) by mouth 2 (two) times daily. 11/20/15  Yes Garvin Fila, MD  b complex vitamins tablet Take 1 tablet by mouth 2 (two) times daily.    Yes Historical Provider, MD  Calcium Carb-Cholecalciferol (CALCIUM 600 + D) 600-200 MG-UNIT TABS Take 1 tablet by mouth 2 (two) times daily.   Yes Historical Provider, MD  cetirizine (ZYRTEC) 10 MG tablet Take 10 mg by mouth at bedtime.   Yes Historical Provider, MD  Cholecalciferol (VITAMIN D3) 2000 units TABS Take 1 tablet by mouth daily.   Yes Historical Provider, MD  Cranberry 250 MG CAPS Take 2 capsules by mouth daily.    Yes Historical Provider, MD  divalproex (DEPAKOTE) 125 MG DR tablet Take 2 tablets (250 mg total) by mouth 2 (two) times daily. 11/04/15  Yes Garvin Fila, MD  docusate sodium (COLACE) 100 MG capsule Take 100 mg by mouth 2 (two) times daily.   Yes Historical Provider, MD  hydrocortisone (ANUSOL-HC) 2.5 % rectal cream Place 1 application rectally as needed for hemorrhoids or itching.   Yes Historical Provider, MD  hydrocortisone (ANUSOL-HC) 25 MG suppository Place 25 mg rectally as needed for hemorrhoids or itching.   Yes Historical Provider, MD  LORazepam (ATIVAN) 0.5 MG tablet Take 1 tablet (0.5 mg total) by mouth every 8 (eight) hours as needed for anxiety. Patient taking differently: Take 0.5 mg by mouth at bedtime.  10/02/15  Yes Gildardo Cranker, DO  Melatonin 3 MG TABS Take 1 tablet by mouth at bedtime   Yes Historical Provider, MD  mirabegron ER (MYRBETRIQ) 50 MG TB24 tablet Take 1 tablet (50 mg total) by mouth daily. 09/26/15  Yes Gildardo Cranker, DO  Multiple Vitamin (MULTIVITAMIN WITH MINERALS) TABS Take 1 tablet by mouth daily.   Yes Historical Provider, MD  olopatadine (PATANOL) 0.1 % ophthalmic  solution PLACE 1 DROP INTO BOTH EYES 2 (TWO) TIMES DAILY. Patient taking differently: Place 1 drop into both eyes 2 (two) times daily as needed for allergies.  10/27/15  Yes Monica Carter, DO  ondansetron (ZOFRAN) 4 MG tablet Take 1 tablet (4 mg total) by mouth every 6 (six) hours as needed for nausea. 10/31/14  Yes Delfina Redwood, MD  darifenacin (ENABLEX) 7.5 MG 24 hr tablet Take 1 tablet (7.5 mg total) by mouth daily. Take in AM Patient not taking: Reported on 10/16/2015 10/10/15   Gildardo Cranker, DO    Family History Family History  Problem Relation Age of Onset  . Cancer Mother     breast  . Aneurysm Mother     brain  . Stroke Mother   . Colon cancer Father   . Breast cancer    . Brain cancer    .  Heart attack Neg Hx   . Diabetes Neg Hx   . Hypertension Neg Hx     Social History Social History  Substance Use Topics  . Smoking status: Former Research scientist (life sciences)  . Smokeless tobacco: Never Used  . Alcohol use No     Comment: 08/01/2012 "glass of wine on special occasions"     Allergies   Aricept [donepezil hcl]; Tramadol hcl; and Alendronate sodium   Review of Systems Review of Systems 10 Systems reviewed and are negative for acute change except as noted in the HPI. Review of systems is provided by her daughters for the caregivers.  Physical Exam Updated Vital Signs BP (!) 135/48   Pulse 68   Temp 98 F (36.7 C) (Oral)   Resp 21   Ht 5\' 6"  (1.676 m)   Wt 123 lb 10.9 oz (56.1 kg)   SpO2 97%   BMI 19.96 kg/m   Physical Exam  Constitutional: She appears well-developed and well-nourished.  On arrival patient is awake and alert. She has no respiratory distress.  HENT:  Head: Normocephalic and atraumatic.  Nose: Nose normal.  Mouth/Throat: Oropharynx is clear and moist.  Eyes: EOM are normal. Pupils are equal, round, and reactive to light.  Neck: Neck supple.  Cardiovascular: Normal rate, regular rhythm, normal heart sounds and intact distal pulses.   Pulmonary/Chest:  Effort normal and breath sounds normal.  Abdominal: Soft. She exhibits no distension. There is no tenderness.  Musculoskeletal: Normal range of motion. She exhibits deformity. She exhibits no edema.  Neurological: She is alert.  Patient is oriented to place upon questioning. She does not give the correct date. Patient follows commands for grip strength and bilateral pedal extension against resistance and elevation off of the bed.  Upon second evaluation when patient is to room after CT scan, the patient is very limited in any verbal response now. She appears to just become tearful and is not answering questions. She still remains very alert. She is still following commands to perform motor testing. Now, she appears a slightly less effort for grip on the left as opposed to the right. He can elevate each leg independently off of the bed. She also can perform plantar extension against resistance. She also performs cranial nerve testing symmetrically.  Skin: Skin is warm and dry.  Psychiatric:  Patient's mood seems to be subdued but still anxious.     ED Treatments / Results  Labs (all labs ordered are listed, but only abnormal results are displayed) Labs Reviewed  COMPREHENSIVE METABOLIC PANEL - Abnormal; Notable for the following:       Result Value   Glucose, Bld 170 (*)    Creatinine, Ser 1.04 (*)    Calcium 8.4 (*)    Total Protein 6.0 (*)    Albumin 3.3 (*)    ALT 13 (*)    GFR calc non Af Amer 46 (*)    GFR calc Af Amer 54 (*)    All other components within normal limits  CBG MONITORING, ED - Abnormal; Notable for the following:    Glucose-Capillary 154 (*)    All other components within normal limits  I-STAT CHEM 8, ED - Abnormal; Notable for the following:    Creatinine, Ser 1.10 (*)    Glucose, Bld 167 (*)    All other components within normal limits  PROTIME-INR  APTT  CBC  DIFFERENTIAL  I-STAT TROPOININ, ED    EKG  EKG Interpretation None  Radiology Ct  Head Code Stroke W/o Cm  Result Date: 11/23/2015 CLINICAL DATA:  Code stroke. 82 y/o F; right-sided weakness, headache, and left thigh pain. History of cerebral vascular accident. EXAM: CT HEAD WITHOUT CONTRAST TECHNIQUE: Contiguous axial images were obtained from the base of the skull through the vertex without intravenous contrast. COMPARISON:  09/12/2015 CT head.  08/07/2015 MRI brain. FINDINGS: No large acute infarct, focal mass effect, intracranial hemorrhage, hydrocephalus, or hyperdense vessel is identified. Nonspecific confluent hypoattenuation of subcortical and periventricular white matter corresponds to FLAIR signal abnormality on prior MR and is stable, likely representing moderate chronic microvascular ischemic changes. Mild volume loss for age. Chronic right lentiform nucleus, right cerebellar hemisphere, and left parietal infarcts are stable. Visualized paranasal sinuses and mastoids are normally aerated. Orbits are unremarkable. Bilateral intra-ocular lens replacement. Extensive calcific atherosclerosis of cavernous internal carotid arteries. Calvarium is unremarkable. ASPECTS [facility] Stroke Program Early CT Score) - Ganglionic level infarction (caudate, lentiform nuclei, internal capsule, insula, M1-M3 cortex): 7 - Supraganglionic infarction (M4-M6 cortex): 3 Total score (0-10 with 10 being normal): 10 IMPRESSION: 1. No acute intracranial abnormality is identified. If symptoms persist or if clinically indicated MRI is more sensitive for acute stroke. 2. ASPECTS is 10 3. Stable chronic microvascular ischemic changes, parenchymal volume loss, and chronic infarcts in the right lentiform nucleus, left parietal lobe, and right cerebellum. These results were called by telephone at the time of interpretation on 11/23/2015 at 9:08 pm to Dr. Charlesetta Shanks , who verbally acknowledged these results. Electronically Signed   By: Kristine Garbe M.D.   On: 11/23/2015 21:09    Procedures Procedures  (including critical care time) CRITICAL CARE Performed by: Charlesetta Shanks   Total critical care time: 30 minutes  Critical care time was exclusive of separately billable procedures and treating other patients.  Critical care was necessary to treat or prevent imminent or life-threatening deterioration.  Critical care was time spent personally by me on the following activities: development of treatment plan with patient and/or surrogate as well as nursing, discussions with consultants, evaluation of patient's response to treatment, examination of patient, obtaining history from patient or surrogate, ordering and performing treatments and interventions, ordering and review of laboratory studies, ordering and review of radiographic studies, pulse oximetry and re-evaluation of patient's condition.  Medications Ordered in ED Medications - No data to display   Initial Impression / Assessment and Plan / ED Course  I have reviewed the triage vital signs and the nursing notes.  Pertinent labs & imaging results that were available during my care of the patient were reviewed by me and considered in my medical decision making (see chart for details).  Clinical Course   Consult: Seen by Dr. Tressie Ellis upon arrival as code stroke. Consult: (22:12) Dr. Charlynn Grimes internal medicine teaching service for admission.  Final Clinical Impressions(s) / ED Diagnoses   Final diagnoses:  None  Patient will be admitted for ongoing diagnostic evaluation and monitoring status post acute mental status change. At this time, working diagnosis is for [place]. She is not currently having significantly localizing motor deficit. The acute change is focused around decreased verbal expression and change in baseline interactions with family. This may be a manifestation of an expressive aphasia. At this time, there does not appear to be acute metabolic or infectious etiology.  New Prescriptions New Prescriptions   No  medications on file     Charlesetta Shanks, MD 11/23/15 2219    Charlesetta Shanks, MD 11/23/15 2220

## 2015-11-23 NOTE — ED Notes (Signed)
EDP at bedside  

## 2015-11-23 NOTE — H&P (Signed)
Date: 11/23/2015               Patient Name:  [name] MRN: [id number]  DOB: February 23, 1925 Age / Sex: 82 y.o., female   PCP: Gildardo Cranker, DO         Medical Service: Internal Medicine Teaching Service         Attending Physician: Dr. Charlesetta Shanks, MD    First Contact: Dr. Alphonzo Grieve Pager: [phone]  Second Contact: Dr. Dellia Nims Pager: [phone]       After Hours (After 5p/  First Contact Pager: [phone]  weekends / holidays): Second Contact Pager: [phone]   Chief Complaint: Episode of bizarre behavior  History of Present Illness: [name] is a 82 y.o. female with PMHx of of dementia, hypertension, atrial fibrillation on eliquis, CVA with residual left sided weakness, hyperlipidemia, and frequent UTIs presenting with episode of bizarre behavior at home (last known normal 44PM). Patient had been at baseline until this evening and ate dinner with her daughters. After dinner she was sitting on the couch and one of her daughters brought her her medications to take, and her mother just stared at her blankly and without comprehension. She would not answer questions or talk and then began to chuckle/laugh continuously for no apparent reason. When prompted to say her daughters' names she called them all by the same name ("JoJo") and complained of R frontal headache. Her daughters deny that her mother has had any recent complaints, fevers/chills, dysuria, diarrhea, medication changes, appetite loss or poor po intake, or insomnia. The patient has urinary frequency and urgency at baseline. Transported to ED via EMS and continued to remain strangely quiet.  In the ED - patient afebrile, hemodynamically stable, CBC, BMP unremarkable, coags normal, CT head negative for acute intracranial abnormality, and MRI revealed small left ACA-territory infarct of medial frontal lobe.  Meds:  Current Meds  Medication Sig  . acetaminophen (TYLENOL) 325 MG tablet Take 2 tablets (650 mg total) by  mouth every 4 (four) hours as needed for mild pain (or temp >/= 99.5 F).  Marland Kitchen apixaban (ELIQUIS) 2.5 MG TABS tablet Take 1 tablet (2.5 mg total) by mouth 2 (two) times daily.  Marland Kitchen b complex vitamins tablet Take 1 tablet by mouth 2 (two) times daily.   . Calcium Carb-Cholecalciferol (CALCIUM 600 + D) 600-200 MG-UNIT TABS Take 1 tablet by mouth 2 (two) times daily.  . cetirizine (ZYRTEC) 10 MG tablet Take 10 mg by mouth at bedtime.  . Cholecalciferol (VITAMIN D3) 2000 units TABS Take 1 tablet by mouth daily.  . Cranberry 250 MG CAPS Take 2 capsules by mouth daily.   . divalproex (DEPAKOTE) 125 MG DR tablet Take 2 tablets (250 mg total) by mouth 2 (two) times daily.  Marland Kitchen docusate sodium (COLACE) 100 MG capsule Take 100 mg by mouth 2 (two) times daily.  . hydrocortisone (ANUSOL-HC) 2.5 % rectal cream Place 1 application rectally as needed for hemorrhoids or itching.  . hydrocortisone (ANUSOL-HC) 25 MG suppository Place 25 mg rectally as needed for hemorrhoids or itching.  Marland Kitchen LORazepam (ATIVAN) 0.5 MG tablet Take 1 tablet (0.5 mg total) by mouth every 8 (eight) hours as needed for anxiety. (Patient taking differently: Take 0.5 mg by mouth at bedtime. )  . Melatonin 3 MG TABS Take 1 tablet by mouth at bedtime  . mirabegron ER (MYRBETRIQ) 50 MG TB24 tablet Take 1 tablet (50 mg total) by mouth daily.  . Multiple Vitamin (MULTIVITAMIN WITH  MINERALS) TABS Take 1 tablet by mouth daily.  Marland Kitchen olopatadine (PATANOL) 0.1 % ophthalmic solution PLACE 1 DROP INTO BOTH EYES 2 (TWO) TIMES DAILY. (Patient taking differently: Place 1 drop into both eyes 2 (two) times daily as needed for allergies. )  . ondansetron (ZOFRAN) 4 MG tablet Take 1 tablet (4 mg total) by mouth every 6 (six) hours as needed for nausea.    Allergies: Allergies as of 11/23/2015 - Review Complete 11/23/2015  Allergen Reaction Noted  . Aricept [donepezil hcl] Hypertension 12/11/2014  . Tramadol hcl Other (See Comments) 01/09/2010  . Alendronate sodium  Rash and Other (See Comments) 06/09/2009   Past Medical History:  Diagnosis Date  . A-fib ([place])   . Acute cystitis without hematuria   . Acute encephalopathy   . Arthritis   . Atrial fibrillation ([place])    on Eliquis Rx  . Atrial flutter ([place]) January, 2012  . Chronic anticoagulation   . Colon polyp   . Constipation   . Hearing difficulty   . Hemorrhoids   . High cholesterol   . HLD (hyperlipidemia)   . Hypertension   . Left leg weakness 02/05/2013  . Left-sided weakness   . Malnutrition ([place])   . Migraine   . Mild cognitive impairment with memory loss   . Osteoporosis   . Stroke ([place]) 08/02/12    right lenticular nucleus infarct  . TIA (transient ischemic attack)   . Torus palatinus     Family History:  Family History  Problem Relation Age of Onset  . Cancer Mother     breast  . Aneurysm Mother     brain  . Stroke Mother   . Colon cancer Father   . Breast cancer    . Brain cancer    . Heart attack Neg Hx   . Diabetes Neg Hx   . Hypertension Neg Hx     Social History:  Social History   Social History  . Marital status: Widowed    Spouse name: N/A  . Number of children: 3  . Years of education: college   Occupational History  . 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 Smoking status: Former Research scientist (life sciences)  . Smokeless tobacco: Never Used  . Alcohol use No     Comment: 08/01/2012 "glass of wine on special occasions"  . Drug use: No  . Sexual activity: No   Other Topics Concern  . Not on file   Social History Narrative   Patient lives at home with daughters.   Caffeine use: 1/2-1 cup of coffee occasionally         Diet: Regular      Do you drink/ eat things with caffeine? Yes      Marital status:   Widowed                            What year were you married ?       Do you live in a house, apartment,assistred living, condo, trailer, etc.)? House      Is it one or more stories? Yes      How many persons live in your  home ? 4      Do you have any pets in your home ?(please list) Yes/ 1 Dog, 2 Cats      Current or past profession: Editor      Do you exercise? No  Type & how often:      Do you have a living will? Yes      Do you have a DNR form?  Yes                     If not, do you want to discuss one?       Do you have signed POA?HPOA forms? Yes                If so, please bring to your        appointment      regular    Review of Systems: A complete ROS was negative except as per HPI.   Physical Exam: Blood pressure (!) 138/52, pulse 71, temperature 98 F (36.7 C), temperature source Oral, resp. rate 18, height 5\' 6"  (1.676 m), weight 56.1 kg (123 lb 10.9 oz), SpO2 98 %.  General appearance: Elderly female resting comfortably in bed, hard of hearing, pleasantly conversational, appears stated age HENT: Normocephalic, atraumatic, moist mucous membranes, edentulous, neck supple, no JVD or audible carotid bruit Eyes: PERRL, non-icteric Cardiovascular: Regular rate and rhythm, no m/r/g Respiratory: Mild bibasilar crackles, no wheezing/rhonchi, normal work of breathing Abdomen: Soft, BS+, non-distended, diffusely tender to moderate palpation Extremities: Thin, decreased muscle mass, 2+ peripheral pulses, cap refill <2sec Skin: Warm, dry, intact Neuro: Alert and oriented x 0-1 (knows location, but gives maiden name), left-side lower facial droop present, otherwise cranial nerves II-XII intact, pronator drift of RUE with grip 4/5, LUE proximal muscles 4/5, RLE 3/5 proximal muscles, otherwise 5/5 throughout, sensation grossly intact, FNF intact bilaterally, DTRs +1 throughout, no babinski, gait deferred Psych: Pleasant calm affect - apparently abnormal for her in ER per daughters  EKG: NSR  MRI brain wo Con:  IMPRESSION: Acute patchy LEFT anterior cerebral artery territory nonhemorrhagic infarct.  Otherwise stable examination including old RIGHT basal  ganglia lacunar infarct, old LEFT parietal lobe infarct, old small RIGHT cerebellar infarcts and severe chronic small vessel ischemic disease. Moderate to severe global brain atrophy.  Assessment & Plan by Problem:  Acute cerebrovascular accident, left ACA-territory (medial frontal) seen on MRI with new ongoing RUE pronator drift, proximal RLE weakness, has L-sided weakness and facial droop from previous CVA and poor orientation at baseline, on elaquis at home but no antiplatelet, bp medication, or statin. Last known normal 730pm and MRI resulted 1130pm, within TPA window but poor candidate (minor signs, age >6, on oral anticoagulant)  -- Give 1x Aspirin 324 mg  -- Permissive hypertension <220/120  -- Neuro checks Q2h for 12 hours then Q4 for next 12 hours  -- Telemetry and continuous pulse ox  -- TTE, carotid ultrasound  -- HbA1c, lipid panel  -- Passed bedside swallow evaluation  -- PT/OT  -- Resume home Elaquis  -- Consider starting antiplatelet agent and statin  Paroxysmal atrial fibrillation (CHADSVASC score 6, yearly stroke risk 9.7%)  -- Continue home Elaquis  FEN/GI: Heart healthy diet, replete electrolytes as needed  DVT ppx: On Elaquis  Code status: DNR/DNI  Dispo: Admit patient to Observation with expected length of stay less than 2 midnights.  Signed: Asencion Partridge, MD 11/23/2015, 10:30 PM  Pager: [phone]

## 2015-11-24 ENCOUNTER — Encounter (HOSPITAL_COMMUNITY): Payer: Medicare Other

## 2015-11-24 ENCOUNTER — Inpatient Hospital Stay (HOSPITAL_COMMUNITY)
Admit: 2015-11-24 | Discharge: 2015-11-24 | Disposition: A | Discharge status: Home / Self Care | Payer: Medicare Other | Attending: Nurse Practitioner | Admitting: Nurse Practitioner

## 2015-11-24 ENCOUNTER — Encounter (HOSPITAL_COMMUNITY): Payer: Self-pay | Admitting: *Deleted

## 2015-11-24 DIAGNOSIS — M6289 Other specified disorders of muscle: Secondary | ICD-10-CM

## 2015-11-24 DIAGNOSIS — Z87891 Personal history of nicotine dependence: Secondary | ICD-10-CM

## 2015-11-24 DIAGNOSIS — E785 Hyperlipidemia, unspecified: Secondary | ICD-10-CM

## 2015-11-24 DIAGNOSIS — I480 Paroxysmal atrial fibrillation: Secondary | ICD-10-CM

## 2015-11-24 DIAGNOSIS — I63422 Cerebral infarction due to embolism of left anterior cerebral artery: Principal | ICD-10-CM

## 2015-11-24 DIAGNOSIS — Z7901 Long term (current) use of anticoagulants: Secondary | ICD-10-CM

## 2015-11-24 LAB — LIPID PANEL
CHOL/HDL RATIO: 3.7 ratio
CHOLESTEROL: 179 mg/dL (ref 0–200)
HDL: 48 mg/dL (ref >40)
LDL CALC: 116 mg/dL — AB (ref 0–99)
Triglycerides: 75 mg/dL (ref <150)
VLDL: 15 mg/dL (ref 0–40)

## 2015-11-24 LAB — BASIC METABOLIC PANEL
Anion gap: 7 (ref 5–15)
BUN: 15 mg/dL (ref 6–20)
CHLORIDE: 111 mmol/L (ref 101–111)
CO2: 25 mmol/L (ref 22–32)
CREATININE: 0.82 mg/dL (ref 0.44–1.00)
Calcium: 8.3 mg/dL — ABNORMAL LOW (ref 8.9–10.3)
GFR calc Af Amer: >60 mL/min (ref >60)
GFR calc non Af Amer: >60 mL/min (ref >60)
Glucose, Bld: 96 mg/dL (ref 65–99)
POTASSIUM: 3.8 mmol/L (ref 3.5–5.1)
Sodium: 143 mmol/L (ref 135–145)

## 2015-11-24 MED ORDER — HYDROCORTISONE 2.5 % RE CREA: 1.0000 "application " | TOPICAL_CREAM | RECTAL | Status: DC | PRN

## 2015-11-24 MED ORDER — DOCUSATE SODIUM 100 MG PO CAPS
100.0000 mg | ORAL_CAPSULE | Freq: Two times a day (BID) | ORAL | Status: DC
  Administered 2015-11-25 – 2015-11-26 (×3): 100 mg via ORAL
  Filled 2015-11-24 (×3): qty 1

## 2015-11-24 MED ORDER — HYDROCORTISONE ACETATE 25 MG RE SUPP: 25.0000 mg | RECTAL | Status: DC | PRN

## 2015-11-24 MED ORDER — APIXABAN 5 MG PO TABS
5.0000 mg | ORAL_TABLET | Freq: Two times a day (BID) | ORAL | Status: DC
  Administered 2015-11-24 – 2015-11-26 (×4): 5 mg via ORAL
  Filled 2015-11-24 (×4): qty 1

## 2015-11-24 MED ORDER — ACETAMINOPHEN 325 MG PO TABS
650.0000 mg | ORAL_TABLET | Freq: Four times a day (QID) | ORAL | Status: DC | PRN
  Administered 2015-11-25: 650 mg via ORAL
  Filled 2015-11-24: qty 2

## 2015-11-24 MED ORDER — APIXABAN 2.5 MG PO TABS
2.5000 mg | ORAL_TABLET | Freq: Two times a day (BID) | ORAL | Status: DC
  Administered 2015-11-24: 2.5 mg via ORAL
  Filled 2015-11-24: qty 1

## 2015-11-24 MED ORDER — SODIUM CHLORIDE 0.9 % IV SOLN
INTRAVENOUS | Status: AC
  Administered 2015-11-24: 01:00:00 via INTRAVENOUS

## 2015-11-24 MED ORDER — SODIUM CHLORIDE 0.9% FLUSH
3.0000 mL | Freq: Two times a day (BID) | INTRAVENOUS | Status: DC
  Administered 2015-11-24 – 2015-11-26 (×4): 3 mL via INTRAVENOUS

## 2015-11-24 MED ORDER — APIXABAN 2.5 MG PO TABS
2.5000 mg | ORAL_TABLET | Freq: Once | ORAL | Status: AC
  Administered 2015-11-24: 2.5 mg via ORAL
  Filled 2015-11-24: qty 1

## 2015-11-24 MED ORDER — ASPIRIN 325 MG PO TABS
325.0000 mg | ORAL_TABLET | Freq: Once | ORAL | Status: AC
  Administered 2015-11-24: 325 mg via ORAL
  Filled 2015-11-24: qty 1

## 2015-11-24 MED ORDER — DIVALPROEX SODIUM 250 MG PO DR TAB
250.0000 mg | DELAYED_RELEASE_TABLET | Freq: Two times a day (BID) | ORAL | Status: DC
  Administered 2015-11-25 – 2015-11-26 (×3): 250 mg via ORAL
  Filled 2015-11-24 (×4): qty 1

## 2015-11-24 MED ORDER — ATORVASTATIN CALCIUM 10 MG PO TABS
20.0000 mg | ORAL_TABLET | Freq: Every day | ORAL | Status: DC
  Administered 2015-11-25: 20 mg via ORAL
  Filled 2015-11-24: qty 2

## 2015-11-24 NOTE — Progress Notes (Signed)
   Subjective: Patient denies current complaints.   One of her daughters whom she lives with was present in the room. She re-iterated that patient was acting normally during dinner; they left her for a few minutes after which they found her staring blankly at the TV, not really responding, and laughing inappropriately.   Objective:  Vital signs in last 24 hours: Vitals:   11/24/15 0527 11/24/15 0708 11/24/15 0908 11/24/15 1100  BP: (!) 181/73 (!) 181/66 (!) 148/73 (!) 172/64  Pulse: 72 71 81 80  Resp:   16 20  Temp:   98.2 F (36.8 C) 97.9 F (36.6 C)  TempSrc:   Oral Oral  SpO2: 98% 99% 96% 96%  Weight:      Height:       Constitutional: NAD, sitting comfortably in chair Neuro: oriented to person only, able to follow commands, CN 2-12 grossly intact, strength RU/LE 5/5, LUE 4/5, LLE 5/5 CV: RRR, no murmurs, rubs or gallops Resp: CTAB, no increased work of breathing, no wheezing or crackles appreciated Abd: soft, +BS, nontender, nondistended Ext: warm, 2+ pulses throughout  Assessment/Plan:  Principal Problem:   CVA (cerebral infarction)  Acute CVA: Left medial frontal ACA infarct on MRI, presenting with RUE pronator drift and proximal RLE weakness that are improving today. She has residual L-sided weakness and facial droop from previous CVA. She was on 2.5mg  Eliquis BID without antiplatelets, blood pressure medications or statin. Patient was not a TPA candidate due to minor signs, age >8yo and on oral anticoagulation. Last workup: Carotid dopplers 1-39% bilaterally 5/17, ECHO EF 0000000 w/o embolic source except a fib 5/17, LDL 82 (5/17), HgbA1C 5.7 (5/17). --Lipids: Chol 179, LDL 116 --HbA1C pending --Carotid u/s deferred as normal 3 months previous --ECHO pending --Speech eval: passed - HH diet --PT/OT eval - SNF placement>Social work consulted --Telemetry monitoring, neuro checks --permissive hypertension <220/120 --consider statin on discharge - will discuss with family  as well  PAF: Patient has history of PAF on Eliquis 2.5mg  BID. CHADVASC score of 6. In sinus rhythm on admission. --discuss continuing Eliquis vs warfarin with family  Dispo: Anticipated discharge in approximately 1-2 day(s).   Alphonzo Grieve, MD 11/24/2015, 11:55 AM Pager: [phone]

## 2015-11-24 NOTE — Procedures (Signed)
ELECTROENCEPHALOGRAM REPORT  Date of Study: 11/24/2015  Patient's Name: [name] MRN: [id number] Date of Birth: 05/12/1924  Referring Provider: Burnetta Sabin, NP  Clinical History: This is an 82 year old woman with right-sided weakness that resolved.  Medications: divalproex (DEPAKOTE) DR tablet 250 mg  acetaminophen (TYLENOL) tablet 650 mg  apixaban (ELIQUIS) tablet 2.5 mg  docusate sodium (COLACE) capsule 100 mg   Technical Summary: A multichannel digital EEG recording measured by the international 10-20 system with electrodes applied with paste and impedances below 5000 ohms performed in our laboratory with EKG monitoring in an awake and dorwsy patient.  Hyperventilation and photic stimulation were not performed.  The digital EEG was referentially recorded, reformatted, and digitally filtered in a variety of bipolar and referential montages for optimal display.    Description: The patient is awake and drowsy during the recording.  During maximal wakefulness, there is a symmetric, medium voltage 10 Hz posterior dominant rhythm that poorly attenuates with eye opening and eye closure. There is focal theta and delta slowing seen over the left temporal region, at times sharply contoured without clear epileptogenic potential. During drowsiness, there is an increase in theta slowing of the background.  Deeper stages of sleep were not seen. Hyperventilation and photic stimulation were not performed.  There were no epileptiform discharges or electrographic seizures seen.    EKG lead was unremarkable.  Impression: This awake and drowsy EEG is abnormal due to focal slowing over the left temporal region.  Clinical Correlation of the above findings indicates focal cerebral dysfunction over the left temporal region suggestive of underlying structural or physiologic abnormality. The absence of epileptiform discharges does not exclude a clinical diagnosis of epilepsy. Clinical correlation is  advised.   Ellouise Newer, M.D.

## 2015-11-24 NOTE — Discharge Instructions (Signed)

## 2015-11-24 NOTE — Progress Notes (Addendum)
Rehab Admissions Coordinator Note:  Patient was screened by Cleatrice Burke for appropriateness for an Inpatient Acute Rehab Consult per OT eval.  At this time, we are recommending Inpatient Rehab consult. Please place order for consult.  Cleatrice Burke 11/24/2015, 9:16 AM  I can be reached at [phone].  Spoke with OT, daughter wishes for SNF or Home at this time due to previous experience during CIR admission.

## 2015-11-24 NOTE — ED Notes (Signed)
82yo female in from home via EMS, [place], per family pt started "not acting right" Pt has been A/OX4, NIHSS 0. Pt is incontinent of urine. Passed stroke swallow screen. VSS

## 2015-11-24 NOTE — Evaluation (Addendum)
Occupational Therapy Evaluation Patient Details Name: [name] MRN: [id number] DOB: Jun 13, 1924 Today's Date: 11/24/2015    History of Present Illness 82 yo female admitted with acute CVA MRI(+) Acute patchy LEFT anterior cerebral artery territory nonhemorrhagic   Clinical Impression  Past Medical History:  Diagnosis Date  . A-fib ([place])   . Acute cystitis without hematuria   . Acute encephalopathy   . Arthritis   . Atrial fibrillation ([place])    on Eliquis Rx  . Atrial flutter ([place]) January, 2012  . Chronic anticoagulation   . Colon polyp   . Constipation   . Hearing difficulty   . Hemorrhoids   . High cholesterol   . HLD (hyperlipidemia)   . Hypertension   . Left leg weakness 02/05/2013  . Left-sided weakness   . Malnutrition ([place])   . Migraine   . Mild cognitive impairment with memory loss   . Osteoporosis   . Stroke ([place]) 08/02/12    right lenticular nucleus infarct  . TIA (transient ischemic attack)   . Torus palatinus     PT admitted with L ACA infarct. Pt currently with functional limitiations due to the deficits listed below (see OT problem list). PTA was living at home with x3 daughter (A) for adls but able to complete oral care / toileting.  Pt will benefit from skilled OT to increase their independence and safety with adls and balance to allow discharge CIR.     Follow Up Recommendations  SNF- recommended CIR and after talking with daughter CIR is not an option. Pt fell on CIR previously and family reports SNF or home. Pt daughter reports pt must be continent to return home.    Equipment Recommendations  SNF    Recommendations for Other Services Rehab consult     Precautions / Restrictions Precautions Precautions: Fall      Mobility Bed Mobility               General bed mobility comments: see PT eval  Transfers Overall transfer level: Needs assistance Equipment used: 2 person hand held assist Transfers: Sit to/from Stand;Stand Pivot  Transfers Sit to Stand: +2 physical assistance;Min assist Stand pivot transfers: +2 physical assistance;Mod assist            Balance Overall balance assessment: Needs assistance Sitting-balance support: Bilateral upper extremity supported;Feet supported Sitting balance-Leahy Scale: Fair     Standing balance support: Bilateral upper extremity supported;During functional activity Standing balance-Leahy Scale: Poor                              ADL Overall ADL's : Needs assistance/impaired     Grooming: Oral care;Wash/dry face;Minimal assistance;Sitting Grooming Details (indicate cue type and reason): pt needed cues to open tooth paste. pt needed (A) to squeeze tube to place paste on the brush. pt needed (A) to terminate task. pt picking up tooth paste with L hand and attempting to place in mouth while brushing with R hand. pt appears slightly startled when tube touches mouth during oral care.          Upper Body Dressing : Moderate assistance       Toilet Transfer: +2 for physical assistance;Moderate assistance;BSC;Stand-pivot Toilet Transfer Details (indicate cue type and reason): pt needed (A) to initiate and to shift hips toward 3n1 Toileting- Clothing Manipulation and Hygiene: Total assistance Toileting - Clothing Manipulation Details (indicate cue type and reason): pt attempting to wipe on 3n1  in sitting but unable to complete       General ADL Comments: Pt at EOB with PT on arrival. pt unable to complete peri care which daughter reports is not normal     Vision Additional Comments: pt uses a central vision static stare with adl task    Perception     Praxis      Pertinent Vitals/Pain Pain Assessment: No/denies pain (moans often but without actual pain. this is baseline)     Hand Dominance Right   Extremity/Trunk Assessment Upper Extremity Assessment Upper Extremity Assessment: Generalized weakness   Lower Extremity Assessment Lower Extremity  Assessment: Defer to PT evaluation   Cervical / Trunk Assessment Cervical / Trunk Assessment: Kyphotic   Communication Communication Communication: HOH   Cognition Arousal/Alertness: Awake/alert Behavior During Therapy: Flat affect Overall Cognitive Status: History of cognitive impairments - at baseline                     General Comments       Exercises       Shoulder Instructions      Home Living Family/patient expects to be discharged to:: Private residence Living Arrangements: Children Available Help at Discharge: Family;Available 24 hours/day Type of Home: House Home Access: Stairs to enter [employer] of Steps: 2 small steps from garage Entrance Stairs-Rails: None Home Layout: Two level Alternate Level Stairs-Number of Steps: has a stair lift   Bathroom Shower/Tub: Occupational psychologist: Standard     Home Equipment: Environmental consultant - 2 wheels;Shower seat - built in;Bedside Copywriter, advertising - 4 wheels;Hand held shower head;Grab bars - toilet;Grab bars - tub/shower   Additional Comments: hand held (A) on second floor for transfers. x3 children (daughters) live together. wears hearing aides but does not wear in hospital due to concern of being lose. pt uses a headset with voice amplifier. Pt has home instead when daughters are not in the home to help with care. Pt is very anxious about staff (A) due to previous hospital fall with shoulder injury.       Prior Functioning/Environment Level of Independence: Needs assistance  Gait / Transfers Assistance Needed: Uses RW on main level of home. HHA upstairs in bathroom due to space limitations for the walker.  ADL's / Homemaking Assistance Needed: Needs assist with all ADLs.  Pt's daughters report she can complete UB bathing and dressing with setup assist, but is total assist for LB bathing and dressing.  Pt requires max-total assist with toileting. Able to feed self with set up. Completes  some grooming herself (oral care) and some she has help with (brushing hair). daughter reports they often put a wash cloth in L hand due to patients L Ue movement with patients lack of awareness ( grabbing at things without knowing)        OT Diagnosis: Generalized weakness;Cognitive deficits;Altered mental status   OT Problem List: Decreased strength;Decreased activity tolerance;Impaired balance (sitting and/or standing);Decreased coordination;Decreased cognition;Decreased safety awareness;Decreased knowledge of use of DME or AE;Decreased knowledge of precautions   OT Treatment/Interventions: Self-care/ADL training;Therapeutic exercise;Neuromuscular education;DME and/or AE instruction;Therapeutic activities;Cognitive remediation/compensation;Patient/family education;Balance training    OT Goals(Current goals can be found in the care plan section) Acute Rehab OT Goals Patient Stated Goal: none stated OT Goal Formulation: With patient/family Time For Goal Achievement: 12/08/15 Potential to Achieve Goals: Good ADL Goals Pt Will Perform Grooming: with supervision;sitting Pt Will Perform Upper Body Bathing: with supervision;sitting Pt Will Transfer to Toilet: with supervision;bedside commode  OT Frequency: Min 2X/week   Barriers to D/C:            Co-evaluation              End of Session Equipment Utilized During Treatment: Gait belt Nurse Communication: Mobility status;Precautions  Activity Tolerance: Patient tolerated treatment well Patient left: in chair;with call bell/phone within reach;with chair alarm set;with family/visitor present   Time: [id number] OT Time Calculation (min): 23 min Charges:  OT General Charges $OT Visit: 1 Procedure OT Evaluation $OT Eval Moderate Complexity: 1 Procedure G-Codes:    Peri Maris 2015/12/07, 8:46 AM   Jeri Modena   OTR/L [phone] Office: [phone] .

## 2015-11-24 NOTE — Progress Notes (Signed)
Routine adult EEG completed, results pending. 

## 2015-11-24 NOTE — Progress Notes (Signed)
STROKE TEAM PROGRESS NOTE   HISTORY OF PRESENT ILLNESS (per record) [name] is an 82 y.o. female with a history of atrial fibrillation on anticoagulation, hypertension, stroke with residual left-sided weakness, and hyperlipidemia brought to the emergency room following acute onset of weakness involving her right side. She also complained of left supraorbital headache. No facial droop was reported. There was no slurring of speech. Patient has been taking Eliquis for anticoagulation. CT scan of the head showed no acute intracranial abnormality. Headache as well as right-sided weakness subsequently resolved. NIH stroke score at the time of this evaluation was 0. She was LKW at  7:30 PM on 11/23/2015. Patient was not administered IV t-PA secondary to deficits resolved. She was admitted for further evaluation and treatment.   SUBJECTIVE (INTERVAL HISTORY) Her 2 daughters are present at the bedside.  Patient sitting up in the chair. No new complaints. Per daughter, she is incontinent now, and this may preclude them being able to take her home.    OBJECTIVE Temp:  [98 F (36.7 C)-98.6 F (37 C)] 98.2 F (36.8 C) (08/28 0908) Pulse Rate:  [66-81] 81 (08/28 0908) Cardiac Rhythm: Normal sinus rhythm (08/28 0742) Resp:  [16-21] 16 (08/28 0908) BP: (135-181)/(48-76) 148/73 (08/28 0908) SpO2:  [95 %-99 %] 96 % (08/28 0908) Weight:  [56.1 kg (123 lb 10.9 oz)-63.4 kg (139 lb 12.4 oz)] 63.4 kg (139 lb 12.4 oz) (08/28 0041)  CBC:  Recent Labs Lab 11/23/15 2055 11/23/15 2116  WBC 4.9  --   NEUTROABS 2.6  --   HGB 12.3 12.6  HCT 38.9 37.0  MCV 96.5  --   PLT 183  --     Basic Metabolic Panel:  Recent Labs Lab 11/23/15 2055 11/23/15 2116 11/24/15 0416  NA 137 144 143  K 4.1 4.3 3.8  CL 106 106 111  CO2 24  --  25  GLUCOSE 170* 167* 96  BUN 15 17 15   CREATININE 1.04* 1.10* 0.82  CALCIUM 8.4*  --  8.3*    Lipid Panel:    Component Value Date/Time   CHOL 179 11/24/2015 0417    TRIG 75 11/24/2015 0417   HDL 48 11/24/2015 0417   CHOLHDL 3.7 11/24/2015 0417   VLDL 15 11/24/2015 0417   LDLCALC 116 (H) 11/24/2015 0417   HgbA1c:  Lab Results  Component Value Date   HGBA1C 5.7 (H) 08/07/2015   Urine Drug Screen:    Component Value Date/Time   LABOPIA NONE DETECTED 12/18/2014 0225   COCAINSCRNUR NONE DETECTED 12/18/2014 0225   LABBENZ NONE DETECTED 12/18/2014 0225   AMPHETMU NONE DETECTED 12/18/2014 0225   THCU NONE DETECTED 12/18/2014 0225   LABBARB NONE DETECTED 12/18/2014 0225      IMAGING I have personally reviewed the radiological images below and agree with the radiology interpretations.  Mr Brain Wo Contrast (neuro Protocol) 11/23/2015 Acute patchy LEFT anterior cerebral artery territory nonhemorrhagic infarct. Otherwise stable examination including old RIGHT basal ganglia lacunar infarct, old LEFT parietal lobe infarct, old small RIGHT cerebellar infarcts and severe chronic small vessel ischemic disease. Moderate to severe global brain atrophy.   Ct Head Code Stroke W/o Cm 11/23/2015 1. No acute intracranial abnormality is identified. If symptoms persist or if clinically indicated MRI is more sensitive for acute stroke. 2. ASPECTS is 10 3. Stable chronic microvascular ischemic changes, parenchymal volume loss, and chronic infarcts in the right lentiform nucleus, left parietal lobe, and right cerebellum.   EEG - This awake and drowsy  EEG is abnormal due to focal slowing over the left temporal region. Clinical Correlation of the above findings indicates focal cerebral dysfunction over the left temporal region suggestive of underlying structural or physiologic abnormality. The absence of epileptiform discharges does not exclude a clinical diagnosis of epilepsy. Clinical correlation is advised.   PHYSICAL EXAM  Temp:  [97.9 F (36.6 C)-98.6 F (37 C)] 97.9 F (36.6 C) (08/28 1100) Pulse Rate:  [66-81] 80 (08/28 1100) Resp:  [16-21] 20 (08/28  1100) BP: (135-181)/(48-76) 172/64 (08/28 1100) SpO2:  [95 %-99 %] 96 % (08/28 1100) Weight:  [123 lb 10.9 oz (56.1 kg)-139 lb 12.4 oz (63.4 kg)] 139 lb 12.4 oz (63.4 kg) (08/28 0041)  General - Thin built, well developed, in no apparent distress.  Ophthalmologic - Fundi not visualized due to noncooperation.  Cardiovascular - Regular rate and rhythm.  Mental Status -  Level of arousal and orientation to place, and person were intact, but not to time. Language including expression, naming, repetition, comprehension was assessed and found intact, although psychomotor slowing.  Cranial Nerves II - XII - II - Visual field intact OU. III, IV, VI - Extraocular movements intact. V - Facial sensation intact bilaterally. VII - Facial movement intact bilaterally. VIII - Hearing & vestibular intact bilaterally. X - Palate elevates symmetrically. XI - Chin turning & shoulder shrug intact bilaterally. XII - Tongue protrusion intact.  Motor Strength - The patient's strength was normal in all extremities except RUE 4+/5 with positive pronator drift.  Bulk was normal and fasciculations were absent.   Motor Tone - Muscle tone was assessed at the neck and appendages and was normal.  Reflexes - The patient's reflexes were 1+ in all extremities and she had no pathological reflexes.  Sensory - Light touch, temperature/pinprick were assessed and were symmetrical.    Coordination - The patient had normal movements in the hands with no ataxia or dysmetria.  Tremor was absent.  Gait and Station - deferred to PT/OT due to safety concerns.   ASSESSMENT/PLAN [name] is a 82 y.o. female with history of Atrial fibrillation, HTN, previous stroke with residual left hemiparesis and hyperlipidemia presenting with new onset right sided weakness and headache. She did not receive IV t-PA due to deficits resolved.   Stroke:  Left ACA territory infarct embolic secondary to atrial fibrillation  source  MRI  patchy left ACA territory infarct. Old right basal ganglia, old left parietal, old right cerebellar infarcts and severe chronic small vessel disease. Global atrophy.  CUS unremarkable 07/2015  TTE unremarkable 07/2015  EEG no seizure   LDL 116  HgbA1c pending  elqiuis for VTE prophylaxis  Diet Heart Room service appropriate? Yes; Fluid consistency: Thin  Eliquis (apixaban) 2.5 mg bid prior to admission, recommend to increase eliquis to 5mg  bid as her weight is more than 60kg now.   Patient counseled to be compliant with her antithrombotic medications  Ongoing aggressive stroke risk factor management  Therapy recommendations:  SNF  Disposition:  pending   Follow up with Dr. Leonie Man in clinic in 2 months.  Atrial Fibrillation/atrial flutter  Home anticoagulation:  Eliquis (apixaban) daily continued in the hospital along with aspirin 325 mg daily  Had been on Xarelto 15 mg in the past but changed to eliquis due to recurrent stroke   I spoke with Dr. Leonie Man and agreed on Eliquis 5 mg dosing  Hx stroke/TIA/recrudescence of stroke  07/2015 recrudescence of previous stroke, continued on Eliquis  01/2015 seen by  DR. Leonie Man as an OP, stroke prevention, continue Eliquis  11/2014 MRI neg. B ACA stenosis  08/2013 MRI neg. MRA with atherosclerosis  02/2013 L occipital temporal subcortical infarct. ? Subacute hemorrhage secondary fall in Nov  01/2013 MRI x 2 neg. R ACA stenosis  07/2012 R corona radiata infarct. MRA neg  Hypertension  Stable  Long-term BP goal normotensive  Hyperlipidemia  Home meds:  No statin listed  Had been on Lipitor in the past, but is off of it again  LDL 116, goal < 70  Add lipitor 20mg    Continue statin on discharge.  Other Stroke Risk Factors  Advanced age  Former Cigarette smoker  Family hx stroke (mother)  Migraines on depakote  Depakote for seizure prophylaxis  Other Active Problems  Mild cognitive impairment with   MRA loss, unable to tolerate Aricept  Hospital day # 1   Neurology will sign off. Please call with questions. Pt will follow up with Dr. Leonie Man at [facility] in about 2 months. Thanks for the consult.  Rosalin Hawking, MD PhD Stroke Neurology 11/24/2015 6:19 PM      To contact Stroke Continuity provider, please refer to [email or web address] After hours, contact General Neurology

## 2015-11-24 NOTE — Progress Notes (Signed)
OT NOTE  Spoke with daughter "Louis Meckel" and she request community based rehab at [facility] vs home at this time. Pt with previous fall on CIR and family does not want patient to return. Daughter reports pt must be able to transfer with RW one person and demonstrate ability to toilet to return home. Daughter reports pt currently incontinent and can not return home with incontinence.    Jeri Modena   OTR/L Pager: [phone] Office: [phone] .

## 2015-11-24 NOTE — Evaluation (Signed)
Physical Therapy Evaluation Patient Details Name: [name] MRN: [id number] DOB: December 24, 1924 Today's Date: 11/24/2015   History of Present Illness  82 yo female admitted with acute CVA MRI(+) Acute patchy LEFT anterior cerebral artery territory nonhemorrhagic  Clinical Impression  Pt admitted with above. Pt was functioning at minA level and amb short distances with RW, but now requiring assistx2 for std pvt transfers. Pt to benefit from ST-SNF to achieve to minA level for safe transition home with daughters.    Follow Up Recommendations SNF;Supervision/Assistance - 24 hour    Equipment Recommendations  None recommended by PT    Recommendations for Other Services       Precautions / Restrictions Precautions Precautions: Fall Precaution Comments: pt has hearing amplifier that works well Restrictions Weight Bearing Restrictions: No      Mobility  Bed Mobility Overal bed mobility: Needs Assistance Bed Mobility: Rolling;Sidelying to Sit Rolling: Mod assist Sidelying to sit: Max assist       General bed mobility comments: max directional v/c's, assist to achieve sidelying, assist for LE management off EOB and trunk elevation, assist to scoot to EOB  Transfers Overall transfer level: Needs assistance Equipment used: 2 person hand held assist Transfers: Sit to/from Stand Sit to Stand: +2 physical assistance;Min assist Stand pivot transfers: +2 physical assistance;Mod assist       General transfer comment: pt completed std pvt to [facility] then to chair. max directional v/c's to complete task, pt with noted fear of falling  Ambulation/Gait                Stairs            Wheelchair Mobility    Modified Rankin (Stroke Patients Only)       Balance Overall balance assessment: Needs assistance Sitting-balance support: Feet supported;Bilateral upper extremity supported Sitting balance-Leahy Scale: Fair     Standing balance support: Bilateral upper  extremity supported Standing balance-Leahy Scale: Poor Standing balance comment: pt with fear of falling, dependent on PT to maintain balance                             Pertinent Vitals/Pain Pain Assessment: No/denies pain (however pt constantly groans (baseline per daughter))    Home Living Family/patient expects to be discharged to:: Private residence Living Arrangements: Children Available Help at Discharge: Family;Available 24 hours/day Type of Home: House Home Access: Stairs to enter Entrance Stairs-Rails: None Entrance Stairs-Number of Steps: 2 small steps from garage Home Layout: Two level Home Equipment: [place] - 2 wheels;Shower seat - built in;Bedside Copywriter, advertising - 4 wheels;Hand held shower head;Grab bars - toilet;Grab bars - tub/shower Additional Comments: hand held (A) on second floor for transfers. x3 children (daughters) live together. wears hearing aides but does not wear in hospital due to concern of being lose. pt uses a headset with voice amplifier. Pt has home instead when daughters are not in the home to help with care. Pt is very anxious about staff (A) due to previous hospital fall with shoulder injury.     Prior Function Level of Independence: Needs assistance   Gait / Transfers Assistance Needed: Uses RW on main level of home. HHA upstairs in bathroom due to space limitations for the walker.   ADL's / Homemaking Assistance Needed: Needs assist with all ADLs.  Pt's daughters report she can complete UB bathing and dressing with setup assist, but is total assist for LB bathing and dressing.  Pt requires max-total assist with toileting. Able to feed self with set up. Completes some grooming herself (oral care) and some she has help with (brushing hair). daughter reports they often put a wash cloth in L hand due to patients L Ue movement with patients lack of awareness ( grabbing at things without knowing)        Hand Dominance    Dominant Hand: Right    Extremity/Trunk Assessment   Upper Extremity Assessment: Generalized weakness           Lower Extremity Assessment: Generalized weakness      Cervical / Trunk Assessment: Kyphotic  Communication   Communication: HOH  Cognition Arousal/Alertness: Awake/alert Behavior During Therapy: Flat affect Overall Cognitive Status: History of cognitive impairments - at baseline                      General Comments General comments (skin integrity, edema, etc.): pt soiled upon PT arrival. pt dependent for hygiene. pt typically able to tell when she has to urinate but now incontinent    Exercises        Assessment/Plan    PT Assessment Patient needs continued PT services  PT Diagnosis Difficulty walking;Generalized weakness   PT Problem List Decreased strength;Decreased range of motion;Decreased activity tolerance;Decreased balance;Decreased mobility;Decreased coordination  PT Treatment Interventions DME instruction;Gait training;Stair training;Functional mobility training;Therapeutic activities;Therapeutic exercise;Balance training   PT Goals (Current goals can be found in the Care Plan section) Acute Rehab PT Goals Patient Stated Goal: none stated PT Goal Formulation: With patient Time For Goal Achievement: 12/01/15 Potential to Achieve Goals: Good    Frequency Min 3X/week   Barriers to discharge        Co-evaluation               End of Session Equipment Utilized During Treatment: Gait belt Activity Tolerance: Patient tolerated treatment well Patient left: in chair;with call bell/phone within reach;with family/visitor present (with OT) Nurse Communication: Mobility status         Time: [id number] PT Time Calculation (min) (ACUTE ONLY): 28 min   Charges:   PT Evaluation $PT Eval Moderate Complexity: 1 Procedure PT Treatments $Therapeutic Activity: 8-22 mins   PT G CodesKingsley Callander 11/24/2015, 11:11  AM   Kittie Plater, PT, DPT Pager #: [phone] Office #: [phone]

## 2015-11-24 NOTE — Progress Notes (Signed)
Nurse called into patient's room by daughter taht patient is not acting right and its the same way she was acting prior to admit. Patient assessed by nurse and no significant changes noted. Patient was able to tell her name and where she is. Daughter concerned that patient "laughs" when asked questions rather than answering them. Patient noted to be slow to respond but answered questions asked. Will continue to monitor.

## 2015-11-24 NOTE — Progress Notes (Signed)
ANTICOAGULATION CONSULT NOTE - Initial Consult  Pharmacy Consult for Apixaban Indication: atrial fibrillation   Allergies  Allergen Reactions  . Aricept [Donepezil Hcl] Hypertension    With N/V  . Tramadol Hcl Other (See Comments)     lethargy, nausea  . Alendronate Sodium Rash and Other (See Comments)    puffy eyes    Patient Measurements: Height: 5\' 6"  (167.6 cm) Weight: 139 lb 12.4 oz (63.4 kg) IBW/kg (Calculated) : 59.3  Vital Signs: Temp: 97.9 F (36.6 C) (08/28 1100) Temp Source: Oral (08/28 1100) BP: 172/64 (08/28 1100) Pulse Rate: 80 (08/28 1100)  Labs:  Recent Labs  11/23/15 2055 11/23/15 2116 11/24/15 0416  HGB 12.3 12.6  --   HCT 38.9 37.0  --   PLT 183  --   --   APTT 28  --   --   LABPROT 13.9  --   --   INR 1.07  --   --   CREATININE 1.04* 1.10* 0.82    Estimated Creatinine Clearance: 43.5 mL/min (by C-G formula based on SCr of 0.82 mg/dL).   Medical History: Past Medical History:  Diagnosis Date  . A-fib ([place])   . Acute cystitis without hematuria   . Acute encephalopathy   . Arthritis   . Atrial fibrillation ([place])    on Eliquis Rx  . Atrial flutter ([place]) January, 2012  . Chronic anticoagulation   . Colon polyp   . Constipation   . Hearing difficulty   . Hemorrhoids   . High cholesterol   . HLD (hyperlipidemia)   . Hypertension   . Left leg weakness 02/05/2013  . Left-sided weakness   . Malnutrition ([place])   . Migraine   . Mild cognitive impairment with memory loss   . Osteoporosis   . Stroke ([place]) 08/02/12    right lenticular nucleus infarct  . TIA (transient ischemic attack)   . Torus palatinus    Assessment: 89yof on apixaban 2.5mg  bid pta for hx afib/flutter, admitted with acute CVA. MRI showed left medial frontal ACA infarct. Pharmacy consulted for apixaban dosing with question if patient qualified for increased dose 5mg  bid. Since age > 64 is the only criteria she meets for dose reduction, she does qualify for the higher  dose. Weight > 60kg and sCr < 1.5.  Goal of Therapy:  Monitor platelets by anticoagulation protocol: Yes   Plan:  1) Increase apixaban to 5mg  bid  [name] 11/24/2015,3:34 PM

## 2015-11-24 NOTE — Care Management Note (Signed)
Case Management Note  Patient Details  Name: [name] MRN: [id number] Date of Birth: 04/10/1924  Subjective/Objective: Pt admitted with CVA. She is from home with her daughters.                  Action/Plan: PT/OT recommendations are for SNF. CM following for d/c disposition.   Expected Discharge Date:  11/24/15               Expected Discharge Plan:  Skilled Nursing Facility  In-House Referral:  Clinical Social Work  Discharge planning Services  CM Consult  Post Acute Care Choice:    Choice offered to:     DME Arranged:    DME Agency:     HH Arranged:    [place] Agency:     Status of Service:  In process, will continue to follow  If discussed at Long Length of Stay Meetings, dates discussed:    Additional Comments:  Pollie Friar, RN 11/24/2015, 1:39 PM

## 2015-11-25 DIAGNOSIS — I10 Essential (primary) hypertension: Secondary | ICD-10-CM

## 2015-11-25 LAB — CBC
HEMATOCRIT: 36.9 % (ref 36.0–46.0)
HEMOGLOBIN: 11.9 g/dL — AB (ref 12.0–15.0)
MCH: 30.2 pg (ref 26.0–34.0)
MCHC: 32.2 g/dL (ref 30.0–36.0)
MCV: 93.7 fL (ref 78.0–100.0)
Platelets: 186 10*3/uL (ref 150–400)
RBC: 3.94 MIL/uL (ref 3.87–5.11)
RDW: 14.6 % (ref 11.5–15.5)
WBC: 5.4 10*3/uL (ref 4.0–10.5)

## 2015-11-25 LAB — BASIC METABOLIC PANEL
ANION GAP: 5 (ref 5–15)
BUN: 10 mg/dL (ref 6–20)
CALCIUM: 8.3 mg/dL — AB (ref 8.9–10.3)
CHLORIDE: 110 mmol/L (ref 101–111)
CO2: 26 mmol/L (ref 22–32)
Creatinine, Ser: 0.6 mg/dL (ref 0.44–1.00)
GFR calc Af Amer: >60 mL/min (ref >60)
GLUCOSE: 101 mg/dL — AB (ref 65–99)
POTASSIUM: 4.3 mmol/L (ref 3.5–5.1)
Sodium: 141 mmol/L (ref 135–145)

## 2015-11-25 LAB — HEMOGLOBIN A1C
HEMOGLOBIN A1C: 5.3 % (ref 4.8–5.6)
MEAN PLASMA GLUCOSE: 105 mg/dL

## 2015-11-25 MED ORDER — MIRABEGRON ER 25 MG PO TB24
50.0000 mg | ORAL_TABLET | Freq: Every day | ORAL | Status: DC
  Administered 2015-11-25: 50 mg via ORAL
  Filled 2015-11-25: qty 2

## 2015-11-25 MED ORDER — POLYETHYLENE GLYCOL 3350 17 G PO PACK
17.0000 g | PACK | Freq: Every day | ORAL | Status: DC | PRN
  Administered 2015-11-25 – 2015-11-26 (×2): 17 g via ORAL
  Filled 2015-11-25 (×2): qty 1

## 2015-11-25 NOTE — Progress Notes (Signed)
  Date: 11/25/2015  Patient name: [name]  Medical record number: [id number]  Date of birth: [birth year]   Patient seen and examined. Case d/w residents in detail. I agree with findings and plan as documented in Dr. Winfred Burn note.  Patient feels well with no new complaints. Patient was admitted for acute left frontal CVA. She is much improved now. Will increase Eliquis to 5 mg bid. C/w statin. Patient now with SBP in the 180s (permissive HTN for acute CVA). Will need to reintroduce BP meds in the AM. Will have to monitor overnight given elevated SBP. Likely d/c in AM to SNF   Aldine Contes, MD 11/25/2015, 4:12 PM

## 2015-11-25 NOTE — Clinical Social Work Placement (Signed)
   CLINICAL SOCIAL WORK PLACEMENT  NOTE  Date:  11/25/2015  Patient Details  Name: [name] MRN: [id number] Date of Birth: June 27, 1924  Clinical Social Work is seeking post-discharge placement for this patient at the [place] level of care (*CSW will initial, date and re-position this form in  chart as items are completed):  Yes   Patient/family provided with [place] Work Department's list of facilities offering this level of care within the geographic area requested by the patient (or if unable, by the patient's family).  Yes   Patient/family informed of their freedom to choose among providers that offer the needed level of care, that participate in Medicare, Medicaid or managed care program needed by the patient, have an available bed and are willing to accept the patient.  Yes   Patient/family informed of [facility]'s ownership interest in [facility] and [facility], as well as of the fact that they are under no obligation to receive care at these facilities.  PASRR submitted to EDS on       PASRR number received on       Existing PASRR number confirmed on 11/25/15     FL2 transmitted to all facilities in geographic area requested by pt/family on 11/25/15     FL2 transmitted to all facilities within larger geographic area on       Patient informed that his/her managed care company has contracts with or will negotiate with certain facilities, including the following:            Patient/family informed of bed offers received.  Patient chooses bed at       Physician recommends and patient chooses bed at      Patient to be transferred to   on  .  Patient to be transferred to facility by       Patient family notified on   of transfer.  Name of family member notified:        PHYSICIAN       Additional Comment:    _______________________________________________ Darden Dates, LCSW 11/25/2015, 8:41 PM

## 2015-11-25 NOTE — Discharge Summary (Signed)
Name: [name] MRN: [id number] DOB: 1925/03/09 82 y.o. PCP: Toni Cranker, DO  Date of Admission: 11/23/2015  8:53 PM Date of Discharge: 11/26/2015 Attending Physician: Aldine Contes, MD  Discharge Diagnosis: CVA Principal Problem:   CVA (cerebral infarction) Active Problems:   HTN (hypertension)   Paroxysmal atrial fibrillation ([place])   Discharge Medications:   Medication List    STOP taking these medications   darifenacin 7.5 MG 24 hr tablet Commonly known as:  ENABLEX     TAKE these medications   acetaminophen 325 MG tablet Commonly known as:  TYLENOL Take 2 tablets (650 mg total) by mouth every 4 (four) hours as needed for mild pain (or temp >/= 99.5 F).   amLODipine 2.5 MG tablet Commonly known as:  NORVASC Take 1 tablet (2.5 mg total) by mouth daily.   apixaban 5 MG Tabs tablet Commonly known as:  ELIQUIS Take 1 tablet (5 mg total) by mouth 2 (two) times daily. What changed:  medication strength  how much to take   atorvastatin 40 MG tablet Commonly known as:  LIPITOR Take 1 tablet (40 mg total) by mouth daily at 6 PM.   b complex vitamins tablet Take 1 tablet by mouth 2 (two) times daily.   CALCIUM 600 + D 600-200 MG-UNIT Tabs Generic drug:  Calcium Carb-Cholecalciferol Take 1 tablet by mouth 2 (two) times daily.   cetirizine 10 MG tablet Commonly known as:  ZYRTEC Take 10 mg by mouth at bedtime.   Cranberry 250 MG Caps Take 2 capsules by mouth daily.   divalproex 125 MG DR tablet Commonly known as:  DEPAKOTE Take 2 tablets (250 mg total) by mouth 2 (two) times daily.   docusate sodium 100 MG capsule Commonly known as:  COLACE Take 100 mg by mouth 2 (two) times daily.   hydrocortisone 2.5 % rectal cream Commonly known as:  ANUSOL-HC Place 1 application rectally as needed for hemorrhoids or itching.   hydrocortisone 25 MG suppository Commonly known as:  ANUSOL-HC Place 25 mg rectally as needed for hemorrhoids or itching.     LORazepam 0.5 MG tablet Commonly known as:  ATIVAN Take 1 tablet (0.5 mg total) by mouth every 8 (eight) hours as needed for anxiety. What changed:  when to take this   Melatonin 3 MG Tabs Take 1 tablet by mouth at bedtime   mirabegron ER 50 MG Tb24 tablet Commonly known as:  MYRBETRIQ Take 1 tablet (50 mg total) by mouth daily.   multivitamin with minerals Tabs tablet Take 1 tablet by mouth daily.   olopatadine 0.1 % ophthalmic solution Commonly known as:  PATANOL PLACE 1 DROP INTO BOTH EYES 2 (TWO) TIMES DAILY. What changed:  when to take this  reasons to take this   ondansetron 4 MG tablet Commonly known as:  ZOFRAN Take 1 tablet (4 mg total) by mouth every 6 (six) hours as needed for nausea.   polyethylene glycol packet Commonly known as:  MIRALAX / GLYCOLAX Take 17 g by mouth daily as needed for moderate constipation.   Vitamin D3 2000 units Tabs Take 1 tablet by mouth daily.       Disposition and follow-up:   [name] was discharged from [facility] in Good condition.  At the hospital follow up visit please address:  1.   --f/u on chronic constipation; patient was getting colace 100 mg  bid + miralax daily --F/u for signs of residual r-sided weakness and personality change following  CVA --F/u tolerance to Eliquis 5mg  BID; signs or symptoms of bleeding --F/u HTN; was started on amlodipine 2.5mg  daily  2.  Labs / imaging needed at time of follow-up: BMP, CBC in about 1 week from d/c  3.  Pending labs/ test needing follow-up: None  Follow-up Appointments:  Contact information for follow-up providers    SETHI,PRAMOD, MD. Call today.   Specialties:  Neurology, Radiology Why:  Please call to make an appointment to be seen in about 2 months Contact information: [place] [address] [place], [place], [place]. Call today.   Specialty:  Internal Medicine Why:  Please call to make a  hospital follow up appointment to be seen in 2-4 weeks Contact information: [place] [phone] [id number]            Contact information for after-discharge care    Destination    [facility] .   Specialty:  Skilled Nursing Facility Contact information: [place] [facility] Course by problem list: Principal Problem:   CVA (cerebral infarction) Active Problems:   HTN (hypertension)   Paroxysmal atrial fibrillation (H[facility])   Acute CVA:  Patient has a history of CVA's with residual L sided weakness and PAF on Eliquis 2.5mg  BID; she was brought in by family after finding her staring at the TV without response to questioning. On exam, she had a RUE pronator drift and proximal RLE weakness along with her past CVA residual L sided droop and weakness. She was oriented to self only. A CT on arrival was negative for acute infarct but MRI was positive for left medial frontal ACA infarct. She was not a candidate for TPA due to minor signs, age >26yo and current use of oral anticoagulation. Her symptoms improved within one day, though she has changes in personality per family (a lot more pleasant). Last workup in May 2017: Carotid dopplers 1-39% bilaterally, ECHO EF 0000000 w/o embolic source except a fib, LDL 82, HgbA1C 5.7. Carotid dopplers and ECHO were deferred due to recent studies and known PAF; LDL was 116; HbA1C was 5.3; EEG shows focal slowing over left hemisphere. She was started on an increased dose of Eliquis 5mg  BID.  Pt needs PT/OT/Speech therapy at SNF facillity.  PAF: Patient has history of PAF on Eliquis 2.5mg  BID. CHADVASC score of 6. In sinus rhythm on admission. Eliquis increased to 5mg  BID.  Hypertension:  Patient has a history of hypertension and has been controlled without medicines since 2011 due to frequent episodes of hypotension. We allowed permissive hypertension  post stroke and BPs have been running 160's-180's/70's. We started amlodipine 2.5mg  daily. She will f/u outpatient with PCP for further adjustment of BP meds.  Discharge Vitals:   BP (!) 113/47 (BP Location: Left Arm)   Pulse 78   Temp 98.8 F (37.1 C) (Oral)   Resp 18   Ht 5\' 6"  (1.676 m)   Wt 63.4 kg (139 lb 12.4 oz)   SpO2 94%   BMI 22.56 kg/m   Pertinent Labs, Studies, and Procedures:  CT head w/o contrast 11/23/2015:  1. No acute intracranial abnormality is identified. If symptoms persist or if clinically indicated MRI is more sensitive for acute stroke. 2. ASPECTS is 10 3. Stable chronic microvascular ischemic changes, parenchymal volume loss, and chronic infarcts  in the right lentiform nucleus, left parietal lobe, and right cerebellum.  MRI wo contrast 11/23/2015: 1. Acute patchy LEFT anterior cerebral artery territory nonhemorrhagic infarct. 2. Otherwise stable examination including old RIGHT basal ganglia lacunar infarct, old LEFT parietal lobe infarct, old small RIGHT cerebellar infarcts and severe chronic small vessel ischemic disease. Moderate to severe global brain atrophy.  CBC Latest Ref Rng & Units 11/26/2015 11/25/2015 11/23/2015  WBC 4.0 - 10.5 K/uL 8.2 5.4 -  Hemoglobin 12.0 - 15.0 g/dL 12.3 11.9(L) 12.6  Hematocrit 36.0 - 46.0 % 37.9 36.9 37.0  Platelets 150 - 400 K/uL 185 186 -   BMP Latest Ref Rng & Units 11/26/2015 11/25/2015 11/24/2015  Glucose 65 - 99 mg/dL 114(H) 101(H) 96  BUN 6 - 20 mg/dL 15 10 15   Creatinine 0.44 - 1.00 mg/dL 0.67 0.60 0.82  Sodium 135 - 145 mmol/L 139 141 143  Potassium 3.5 - 5.1 mmol/L 4.4 4.3 3.8  Chloride 101 - 111 mmol/L 108 110 111  CO2 22 - 32 mmol/L 25 26 25   Calcium 8.9 - 10.3 mg/dL 8.5(L) 8.3(L) 8.3(L)     Discharge Instructions: Discharge Instructions    Ambulatory referral to Neurology    Complete by:  As directed   Follow up with Dr. Leonie Man at [facility] in about 2 months. Thanks.   Discharge instructions    Complete  by:  As directed   Start taking Lipitor 40mg  daily   Increase Eliquis to 5 mg twice a day.  Follow up with your primary care when you are discharged from the rehab and also follow up with the neurologist      Signed: Alphonzo Grieve, MD 11/26/2015, 3:21 PM   Pager: [phone]

## 2015-11-25 NOTE — Progress Notes (Signed)
Physical Therapy Treatment Patient Details Name: STACEY STAYNER MRN: [id number] DOB: March 19, 1925 Today's Date: 11/25/2015    History of Present Illness 82 yo female admitted with acute CVA MRI(+) Acute patchy LEFT anterior cerebral artery territory nonhemorrhagic    PT Comments    Patient currently mod/max +2 for all mobility. Daughter present for session. Continue to progress as tolerated with anticipated d/c to SNF for further skilled PT services.    Follow Up Recommendations  SNF;Supervision/Assistance - 24 hour     Equipment Recommendations  None recommended by PT    Recommendations for Other Services       Precautions / Restrictions Precautions Precautions: Fall Precaution Comments: pt has hearing amplifier that works well Restrictions Weight Bearing Restrictions: No    Mobility  Bed Mobility Overal bed mobility: Needs Assistance Bed Mobility: Rolling;Sidelying to Sit Rolling: Mod assist Sidelying to sit: Max assist       General bed mobility comments: assist to mobilize bilat LE to EOB and elevate trunk into sitting; hand over hand assist for pt to reach for bed rail when rolling; assist to bring hips toward EOB with use of bed pad  Transfers Overall transfer level: Needs assistance Equipment used: Rolling walker (2 wheeled);1 person hand held assist (face to face with gait belt) Transfers: Sit to/from [employer] Transfers Sit to Stand: Mod assist;+2 physical assistance Stand pivot transfers: Max assist       General transfer comment: multimodal cues for hand placement and sequencing; mod A to stand X2 and max A to pivot from EOB to BSC and BSC to recliner; first stand pivot pt required assist to turn bilat LE, maintain balance, and manage RW  Ambulation/Gait                 Stairs            Wheelchair Mobility    Modified Rankin (Stroke Patients Only)       Balance     Sitting balance-Leahy Scale: Fair       Standing  balance-Leahy Scale: Poor                      Cognition Arousal/Alertness: Awake/alert Behavior During Therapy: Flat affect Overall Cognitive Status: History of cognitive impairments - at baseline                      Exercises      General Comments General comments (skin integrity, edema, etc.): pt incontinent of urine when she stands and is dependent for hygiene; daughter present for session       Pertinent Vitals/Pain Pain Assessment: No/denies pain    Home Living                      Prior Function            PT Goals (current goals can now be found in the care plan section) Acute Rehab PT Goals Patient Stated Goal: none stated PT Goal Formulation: With patient Time For Goal Achievement: 12/01/15 Potential to Achieve Goals: Good Progress towards PT goals: Progressing toward goals    Frequency  Min 3X/week    PT Plan Current plan remains appropriate    Co-evaluation             End of Session Equipment Utilized During Treatment: Gait belt Activity Tolerance: Patient tolerated treatment well Patient left: in chair;with call bell/phone within reach;with family/visitor present  Time: [id number] PT Time Calculation (min) (ACUTE ONLY): 35 min  Charges:  $Therapeutic Activity: 23-37 mins                    G Codes:      [place] April, PTA Pager: [phone]   11/25/2015, 12:11 PM

## 2015-11-25 NOTE — NC FL2 (Signed)
[place] LEVEL OF CARE SCREENING TOOL     IDENTIFICATION  Patient Name: [name] Birthdate: 1925/02/04 Sex: female Admission Date (Current Location): 11/23/2015  [facility] and [place] Number:  Herbalist and Address:  [facility] [place], [place] [address] [place], [place] [address]      Provider Number: [id number]  Attending Physician Name and Address:  Aldine Contes, MD  Relative Name and Phone Number:       Current Level of Care: Hospital Recommended Level of Care: [place] Prior Approval Number:    Date Approved/Denied:   PASRR Number: [id number]  Discharge Plan: SNF    Current Diagnoses: Patient Active Problem List   Diagnosis Date Noted  . CVA (cerebral infarction) 11/23/2015  . Vascular dementia 10/12/2015  . OAB (overactive bladder) 10/12/2015  . Protein-calorie malnutrition ([place]) 10/12/2015  . Senile osteoporosis 10/10/2015  . Acute encephalopathy 08/12/2015  . Acute cystitis without hematuria   . HLD (hyperlipidemia)   . External hemorrhoids without complication [id number]  . Abdominal pain 12/18/2014  . Nausea without vomiting 12/18/2014  . UTI (lower urinary tract infection) 12/18/2014  . Urinary tract infectious disease   . Chest pain 10/31/2014  . Nausea and vomiting 10/31/2014  . Nausea with vomiting   . Mild cognitive impairment with memory loss 07/25/2014  . Malnourished ([place]) 07/25/2014  . Recurrent UTI 08/07/2013  . History of CVA (cerebrovascular accident) 06/02/2013  . Unspecified constipation 03/16/2013  . Left-sided weakness 03/09/2013  . Possible Seizures 02/14/2013  . TIA (transient ischemic attack) 02/12/2013  . Hyperglycemia 02/12/2013  . High cholesterol   . Paroxysmal atrial fibrillation ([place]) 02/05/2013  . Chronic anticoagulation 02/05/2013  . HTN (hypertension) 11/14/2012  . Right ureteral stone 09/15/2010  . Atrial flutter ([place]) 07/17/2010  . Osteopenia of the  elderly 06/03/2009  . Migraine 02/05/2008  . CATARACTS, BILATERAL 02/05/2008  . DECREASED HEARING 02/05/2008  . GEN OSTEOARTHROSIS INVOLVING MULTIPLE SITES 02/05/2008  . COLONIC POLYPS, HX OF 02/05/2008    Orientation RESPIRATION BLADDER Height & Weight     Self  Normal Incontinent Weight: 139 lb 12.4 oz (63.4 kg) Height:  5\' 6"  (167.6 cm)  BEHAVIORAL SYMPTOMS/MOOD NEUROLOGICAL BOWEL NUTRITION STATUS      Incontinent Diet (Heart Healthy)  AMBULATORY STATUS COMMUNICATION OF NEEDS Skin   Limited Assist Verbally Normal                       Personal Care Assistance Level of Assistance  Bathing, Dressing, Feeding Bathing Assistance: Limited assistance Feeding assistance: Limited assistance Dressing Assistance: Limited assistance     Functional Limitations Info  Sight, Hearing, Speech Sight Info: Adequate Hearing Info: Adequate Speech Info: Adequate    SPECIAL CARE FACTORS FREQUENCY  PT (By licensed PT), OT (By licensed OT)     PT Frequency: 5 OT Frequency: 5            Contractures Contractures Info: Not present    Additional Factors Info  Code Status, Allergies Code Status Info: Full Code Allergies Info: Aricept Donepezil Hcl, Tramadol Hcl, Alendronate Sodium           Current Medications (11/25/2015):  This is the current hospital active medication list Current Facility-Administered Medications  Medication Dose Route Frequency Provider Last Rate Last Dose  . acetaminophen (TYLENOL) tablet 650 mg  650 mg Oral Q6H PRN Alphonzo Grieve, MD   650 mg at 11/25/15 1026  . apixaban (ELIQUIS) tablet 5  mg  5 mg Oral BID Otilio Miu, RPH   5 mg at 11/25/15 1026  . atorvastatin (LIPITOR) tablet 20 mg  20 mg Oral q1800 Rosalin Hawking, MD      . divalproex (DEPAKOTE) DR tablet 250 mg  250 mg Oral BID Maryellen Pile, MD   250 mg at 11/25/15 1026  . docusate sodium (COLACE) capsule 100 mg  100 mg Oral BID Alphonzo Grieve, MD   100 mg at 11/25/15 1026  . hydrocortisone  (ANUSOL-HC) 2.5 % rectal cream 1 application  1 application Rectal PRN Maryellen Pile, MD      . hydrocortisone (ANUSOL-HC) suppository 25 mg  25 mg Rectal PRN Maryellen Pile, MD      . polyethylene glycol (MIRALAX / GLYCOLAX) packet 17 g  17 g Oral Daily PRN Alphonzo Grieve, MD   17 g at 11/25/15 1052  . sodium chloride flush (NS) 0.9 % injection 3 mL  3 mL Intravenous Q12H Maryellen Pile, MD   3 mL at 11/24/15 2210     Discharge Medications: Please see discharge summary for a list of discharge medications.  Relevant Imaging Results:  Relevant Lab Results:   Additional Information SSN:  [id number]  Darden Dates, LCSW

## 2015-11-25 NOTE — Clinical Social Work Note (Signed)
Clinical Social Work Assessment  Patient Details  Name: [name] MRN: [id number] Date of Birth: 1924-09-22  Date of referral:  11/25/15               Reason for consult:  Facility Placement                Permission sought to share information with:  Family Supports Permission granted to share information::  Yes, Verbal Permission Granted  Name::     Lucina Betty  Relationship::  daughter  Contact Information:  [phone]  Housing/Transportation Living arrangements for the past 2 months:  Single Family Home Source of Information:  Adult Children Patient Interpreter Needed:  None Criminal Activity/Legal Involvement Pertinent to Current Situation/Hospitalization:  No - Comment as needed Significant Relationships:  Adult Children Lives with:  Adult Children Do you feel safe going back to the place where you live?  Yes Need for family participation in patient care:  Yes (Comment)  Care giving concerns:  Pt may need a higher level of care after STR, due to incontinence, per pt's daughter.  Social Worker assessment / plan:  CSW met with pt and pt's daughter to address consult. Pt is only oriented to person and place. CSW introduce herself and explained role of social work. CSW also explained the process of discharging to SNF with Medicare. Per MD, pt will likely be ready for discharge tomorrow. Pt's daughter shared that they are interested in [facility] for STR, and pt may need LTC due to incontinence issues. CSW explained the process of LTC at [facility]. CSW will follow up with [facility] to inquire about STR and LTC at facility. CSW initiated SNF search and will follow up with bed offers. CSW will continue to follow.   Employment status:  Retired Forensic scientist:  Medicare PT Recommendations:  [place] / Referral to community resources:  [place]  Patient/Family's Response to care:  Pt's daughter was appreciative of care.    Patient/Family's Understanding of and Emotional Response to Diagnosis, Current Treatment, and Prognosis:  Pt's daughter understands the pt may need LTC and it will be private pay.   Emotional Assessment Appearance:  Appears stated age Attitude/Demeanor/Rapport:  Other (Appropriate) Affect (typically observed):  Pleasant Orientation:  Oriented to Self, Oriented to Place Alcohol / Substance use:  Never Used Psych involvement (Current and /or in the community):  No (Comment)  Discharge Needs  Concerns to be addressed:  Adjustment to Illness Readmission within the last 30 days:  No Current discharge risk:  Chronically ill Barriers to Discharge:  Continued Medical Work up   [employer], LCSW 11/25/2015, 8:34 PM

## 2015-11-25 NOTE — Progress Notes (Signed)
   Subjective: Patient denies current complaints. Family states that she is stable from yesterday, with return of her R-sided strength. The only changes from before the current stroke are her personality (apparently more pleasant currently) and decrease in voluntary speech but she answers when asked a question.  Objective:  Vital signs in last 24 hours: Vitals:   11/24/15 2118 11/25/15 0053 11/25/15 0552 11/25/15 0700  BP: (!) 175/77 (!) 185/82 (!) 171/72 (!) 186/63  Pulse: 77 77 66 69  Resp: 18 18 18 18   Temp: 99.4 F (37.4 C) 97.9 F (36.6 C) 98.7 F (37.1 C) 98.3 F (36.8 C)  TempSrc: Oral Oral Oral Oral  SpO2: 99% 100% 96% 98%  Weight:      Height:       Constitutional: NAD, sitting comfortably in chair Neuro: oriented to person only, able to follow commands, CN 2-12 grossly intact, L facial droop, strength RU/LE 5/5, LUE 4/5, LLE 5/5 CV: RRR, no murmurs, rubs or gallops Resp: CTAB, no increased work of breathing, no wheezing or crackles appreciated Abd: soft, +BS, nontender, nondistended Ext: warm, 2+ pulses throughout  Assessment/Plan:  Principal Problem:   CVA (cerebral infarction)  Acute CVA: Left medial frontal ACA infarct on MRI, presenting with RUE pronator drift and proximal RLE weakness that are improving today. She has residual L-sided weakness and facial droop from previous CVA. She was on 2.5mg  Eliquis BID without antiplatelets, blood pressure medications or statin. Patient was not a TPA candidate due to minor signs, age >19yo and on oral anticoagulation. Last workup: Carotid dopplers 1-39% bilaterally 5/17, ECHO EF 0000000 w/o embolic source except a fib 5/17, LDL 82 (5/17), HgbA1C 5.7 (5/17). --Lipids: Chol 179, LDL 116 --HbA1C 5.3 --started on atorvastatin 20mg  daily --Carotid u/s deferred as normal 3 months previous --ECHO deferred as recent 5/17, known PAF --Speech eval: passed - HH diet --PT/OT eval - SNF placement>Social work consulted --Telemetry  monitoring, neuro checks --Will need to manage blood pressure, restarting BP meds tomorrow   Hypertension: Patient has a history of hypertension and has been controlled without medicines since 2011 due to frequent episodes of hypotension. We allowed permissive hypertension post stroke and BPs have been running 160's-180's/70's. --will start BP lowering meds tomorrow and monitor for adequate response  PAF: Patient has history of PAF on Eliquis 5mg  BID. CHADVASC score of 6. In sinus rhythm on admission. --Eliquis 5mg  BID per pharm and neuro  Dispo: Anticipated discharge in approximately 1 day to SNF.   Alphonzo Grieve, MD 11/25/2015, 1:26 PM Pager: [phone]

## 2015-11-26 ENCOUNTER — Encounter: Payer: Self-pay | Admitting: Internal Medicine

## 2015-11-26 DIAGNOSIS — Z8673 Personal history of transient ischemic attack (TIA), and cerebral infarction without residual deficits: Secondary | ICD-10-CM | POA: Diagnosis not present

## 2015-11-26 DIAGNOSIS — H1013 Acute atopic conjunctivitis, bilateral: Secondary | ICD-10-CM | POA: Diagnosis not present

## 2015-11-26 DIAGNOSIS — K5901 Slow transit constipation: Secondary | ICD-10-CM | POA: Diagnosis not present

## 2015-11-26 DIAGNOSIS — M6281 Muscle weakness (generalized): Secondary | ICD-10-CM | POA: Diagnosis present

## 2015-11-26 DIAGNOSIS — G43009 Migraine without aura, not intractable, without status migrainosus: Secondary | ICD-10-CM | POA: Diagnosis not present

## 2015-11-26 DIAGNOSIS — I10 Essential (primary) hypertension: Secondary | ICD-10-CM | POA: Diagnosis present

## 2015-11-26 DIAGNOSIS — E559 Vitamin D deficiency, unspecified: Secondary | ICD-10-CM | POA: Diagnosis not present

## 2015-11-26 DIAGNOSIS — I639 Cerebral infarction, unspecified: Secondary | ICD-10-CM | POA: Diagnosis present

## 2015-11-26 DIAGNOSIS — I480 Paroxysmal atrial fibrillation: Secondary | ICD-10-CM | POA: Diagnosis not present

## 2015-11-26 DIAGNOSIS — Z5189 Encounter for other specified aftercare: Secondary | ICD-10-CM | POA: Diagnosis present

## 2015-11-26 DIAGNOSIS — I63422 Cerebral infarction due to embolism of left anterior cerebral artery: Secondary | ICD-10-CM | POA: Diagnosis not present

## 2015-11-26 DIAGNOSIS — I69154 Hemiplegia and hemiparesis following nontraumatic intracerebral hemorrhage affecting left non-dominant side: Secondary | ICD-10-CM | POA: Diagnosis present

## 2015-11-26 DIAGNOSIS — I6990 Unspecified sequelae of unspecified cerebrovascular disease: Secondary | ICD-10-CM | POA: Diagnosis present

## 2015-11-26 DIAGNOSIS — R531 Weakness: Secondary | ICD-10-CM | POA: Diagnosis not present

## 2015-11-26 DIAGNOSIS — N3281 Overactive bladder: Secondary | ICD-10-CM | POA: Diagnosis not present

## 2015-11-26 DIAGNOSIS — E46 Unspecified protein-calorie malnutrition: Secondary | ICD-10-CM | POA: Diagnosis not present

## 2015-11-26 DIAGNOSIS — J309 Allergic rhinitis, unspecified: Secondary | ICD-10-CM | POA: Diagnosis not present

## 2015-11-26 LAB — BASIC METABOLIC PANEL
ANION GAP: 6 (ref 5–15)
BUN: 15 mg/dL (ref 6–20)
CALCIUM: 8.5 mg/dL — AB (ref 8.9–10.3)
CO2: 25 mmol/L (ref 22–32)
CREATININE: 0.67 mg/dL (ref 0.44–1.00)
Chloride: 108 mmol/L (ref 101–111)
Glucose, Bld: 114 mg/dL — ABNORMAL HIGH (ref 65–99)
Potassium: 4.4 mmol/L (ref 3.5–5.1)
SODIUM: 139 mmol/L (ref 135–145)

## 2015-11-26 LAB — CBC
HCT: 37.9 % (ref 36.0–46.0)
HEMOGLOBIN: 12.3 g/dL (ref 12.0–15.0)
MCH: 30.5 pg (ref 26.0–34.0)
MCHC: 32.5 g/dL (ref 30.0–36.0)
MCV: 94 fL (ref 78.0–100.0)
Platelets: 185 10*3/uL (ref 150–400)
RBC: 4.03 MIL/uL (ref 3.87–5.11)
RDW: 14.7 % (ref 11.5–15.5)
WBC: 8.2 10*3/uL (ref 4.0–10.5)

## 2015-11-26 MED ORDER — ONDANSETRON HCL 4 MG/2ML IJ SOLN
4.0000 mg | Freq: Four times a day (QID) | INTRAMUSCULAR | Status: DC | PRN
  Administered 2015-11-26: 4 mg via INTRAVENOUS
  Filled 2015-11-26: qty 2

## 2015-11-26 MED ORDER — ATORVASTATIN CALCIUM 40 MG PO TABS: 40.0000 mg | ORAL_TABLET | Freq: Every day | ORAL | 0 refills | Status: AC

## 2015-11-26 MED ORDER — POLYETHYLENE GLYCOL 3350 17 G PO PACK
17.0000 g | PACK | Freq: Once | ORAL | Status: DC
  Filled 2015-11-26: qty 1

## 2015-11-26 MED ORDER — POLYETHYLENE GLYCOL 3350 17 G PO PACK: 17.0000 g | PACK | Freq: Every day | ORAL | 0 refills | Status: DC | PRN

## 2015-11-26 MED ORDER — SODIUM CHLORIDE 0.9 % IV SOLN
INTRAVENOUS | Status: AC
  Administered 2015-11-26: 01:00:00 via INTRAVENOUS

## 2015-11-26 MED ORDER — AMLODIPINE BESYLATE 2.5 MG PO TABS: 2.5000 mg | ORAL_TABLET | Freq: Every day | ORAL | 0 refills | Status: DC

## 2015-11-26 MED ORDER — AMLODIPINE BESYLATE 2.5 MG PO TABS
2.5000 mg | ORAL_TABLET | Freq: Every day | ORAL | Status: DC
  Administered 2015-11-26: 2.5 mg via ORAL
  Filled 2015-11-26: qty 1

## 2015-11-26 MED ORDER — APIXABAN 5 MG PO TABS: 5.0000 mg | ORAL_TABLET | Freq: Two times a day (BID) | ORAL | 0 refills | Status: AC

## 2015-11-26 NOTE — Progress Notes (Signed)
  Date: 11/26/2015  Patient name: [name]  Medical record number: [id number]  Date of birth: 09/25/1924   Patient seen and examined. Case d/w residents in detail. I agree with findings and plan as documented in Dr. Winfred Burn note.  Patient with no new complaints. She was admitted for acute L frontal CVA. Stroke w/u is now complete. CVA likely secondary to underlying afib. Eliquis dose increased to 5 mg bid. PT/OT eval appreciated. Patient stable for d/c to SNF once bed available. Of note, she was noted to have vomiting/regurgitation with her last two meals. Will get SLP to re evaluate prior to d/c      Aldine Contes, MD 11/26/2015, 1:53 PM

## 2015-11-26 NOTE — Clinical Social Work Placement (Signed)
   CLINICAL SOCIAL WORK PLACEMENT  NOTE  Date:  11/26/2015  Patient Details  Name: [name] MRN: [id number] Date of Birth: [birth year]  Clinical Social Work is seeking post-discharge placement for this patient at the [place] level of care (*CSW will initial, date and re-position this form in  chart as items are completed):  Yes   Patient/family provided with [place] Work Department's list of facilities offering this level of care within the geographic area requested by the patient (or if unable, by the patient's family).  Yes   Patient/family informed of their freedom to choose among providers that offer the needed level of care, that participate in Medicare, Medicaid or managed care program needed by the patient, have an available bed and are willing to accept the patient.  Yes   Patient/family informed of [facility]'s ownership interest in [facility] and [facility], as well as of the fact that they are under no obligation to receive care at these facilities.  PASRR submitted to EDS on       PASRR number received on       Existing PASRR number confirmed on 11/25/15     FL2 transmitted to all facilities in geographic area requested by pt/family on 11/25/15     FL2 transmitted to all facilities within larger geographic area on       Patient informed that his/her managed care company has contracts with or will negotiate with certain facilities, including the following:        Yes   Patient/family informed of bed offers received.  Patient chooses bed at [facility]     Physician recommends and patient chooses bed at      Patient to be transferred to [facility] on 11/26/15.  Patient to be transferred to facility by [facility]     Patient family notified on 11/26/15 of transfer.  Name of family member notified:  Pt's daughter, Shona Needles     PHYSICIAN       Additional Comment:     _______________________________________________ Darden Dates, LCSW 11/26/2015, 3:44 PM

## 2015-11-26 NOTE — Care Management Note (Signed)
Case Management Note  Patient Details  Name: [name] MRN: [id number] Date of Birth: 1925/03/28  Subjective/Objective:                    Action/Plan: Pt discharging to [facility] today. No further needs per CM.   Expected Discharge Date:  11/24/15               Expected Discharge Plan:  Skilled Nursing Facility  In-House Referral:  Clinical Social Work  Discharge planning Services  CM Consult  Post Acute Care Choice:    Choice offered to:     DME Arranged:    DME Agency:     HH Arranged:    [place] Agency:     Status of Service:  Completed, signed off  If discussed at [employer], dates discussed:    Additional Comments:  Pollie Friar, RN 11/26/2015, 4:05 PM

## 2015-11-26 NOTE — Progress Notes (Signed)
Patient is being d/c to a skilled nursing facility, report called to the receiving nurse. Patient awaiting transportation at this time.

## 2015-11-26 NOTE — Clinical Social Work Note (Signed)
RN Report [place] via [facility] Report [phone]  Room #203-B  Pt is ready for discharge to [facility]. Facility has received discharge information and is ready to admit pt. Pt's daughter is aware and agreeable to discharge plan. RN will call report. [facility] will provide transportation. CSW is signing off as no further needs identified.   Darden Dates, MSW, LCSW  Clinical Social Worker  [phone]

## 2015-11-26 NOTE — Progress Notes (Signed)
   Subjective: Patient denies current complaints. She is able to state her first name but not last name. Her daughters were not currently in the room to speak about progress and how she seems to them.  Objective:  Vital signs in last 24 hours: Vitals:   11/25/15 2152 11/26/15 0114 11/26/15 0546 11/26/15 0715  BP: (!) 182/71 (!) 163/65 (!) 142/66 (!) 183/75  Pulse: 70 70 80 88  Resp: 16 16 16 18   Temp: 98.9 F (37.2 C) 99.1 F (37.3 C) 98.4 F (36.9 C) 100 F (37.8 C)  TempSrc: Oral Oral Oral Oral  SpO2: 95% 93% 94% 97%  Weight:      Height:       Constitutional: NAD, lying comfortably in chair Neuro: oriented to first name only, able to follow commands, CN 2-12 grossly intact, L facial droop, strength RU/LE 5/5, LUE 4/5, LLE 5/5 CV: RRR, no murmurs, rubs or gallops Resp: CTAB, no increased work of breathing, no wheezing or crackles appreciated Abd: soft, +BS, nontender, nondistended Ext: warm, 2+ pulses throughout  Assessment/Plan:  Principal Problem:   CVA (cerebral infarction) Active Problems:   HTN (hypertension)   Paroxysmal atrial fibrillation (HCC)  Acute CVA: Left medial frontal ACA infarct on MRI, presenting with RUE pronator drift and proximal RLE weakness that are improving today. She has residual L-sided weakness and facial droop from previous CVA. She was on 2.5mg  Eliquis BID without antiplatelets, blood pressure medications or statin. Patient was not a TPA candidate due to minor signs, age >9yo and on oral anticoagulation. Last workup: Carotid dopplers 1-39% bilaterally 5/17, ECHO EF 0000000 w/o embolic source except a fib 5/17, LDL 82 (5/17), HgbA1C 5.7 (5/17). Stroke workup complete; to f/u outpatient neuro in 2 months; eliquis 5mg  BID. --Lipids: Chol 179, LDL 116 --HbA1C 5.3 --started on atorvastatin 20mg  daily --Carotid u/s deferred as normal 3 months previous --ECHO deferred as recent 5/17, known PAF --PT/OT eval - SNF placement>Social work  consulted --Telemetry monitoring, neuro checks --Will need to manage blood pressure, started amlodipine 2.5mg  dialy --Speech eval: passed on admission; will re-evaluate today due to vomiting/regurg  Vomiting/regurgitation: Patient has had 2 episodes of vomiting with dinner and breakfast; we can't exclude regurgitation and dysphagia. --re-consult speech/swallow; re-evaluate diet --Zofran 4mg  PRN  Hypertension: Patient has a history of hypertension and has been controlled without medicines since 2011 due to frequent episodes of hypotension. We allowed permissive hypertension post stroke and BPs have been running 160's-180's/70's. --started amlodipine 2.5mg  daily --monitor BP's, will f/u outpatient with PCP for further adjustment   PAF: Patient has history of PAF on Eliquis 5mg  BID. CHADVASC score of 6. In sinus rhythm on admission. --Eliquis 5mg  BID  Dispo: Anticipated discharge today to SNF.   Alphonzo Grieve, MD 11/26/2015, 11:13 AM Pager: [phone]

## 2015-11-26 NOTE — Progress Notes (Signed)
Occupational Therapy Treatment Patient Details Name: [name] MRN: [id number] DOB: 1925/03/26 Today's Date: 11/26/2015    History of present illness 82 yo female admitted with acute CVA MRI(+) Acute patchy LEFT anterior cerebral artery territory nonhemorrhagic   OT comments  Pt with decreased responsiveness although eeys wide open and able to squeeze OT's hand and stick out tongue on command. Pt sat EOB x 5 minutes with min - max for balance/support due to fatiguing easily. OT to continue to follow acutely  Follow Up Recommendations  SNF    Equipment Recommendations    TBD at next venue of care   Recommendations for Other Services      Precautions / Restrictions Precautions Precautions: Fall Precaution Comments: pt has hearing amplifier that works well Restrictions Weight Bearing Restrictions: No       Mobility Bed Mobility Overal bed mobility: Needs Assistance Bed Mobility: Supine to Sit;Sit to Supine     Supine to sit: Max assist;+2 for physical assistance Sit to supine: Max assist;+2 for physical assistance   General bed mobility comments: RN assisted OT with bed mobility. Pt required min - max A for balance/support seated EOB x 5 minutes due to fatiguing easily  Transfers                 General transfer comment: dis not address    Balance     Sitting balance-Leahy Scale: Poor                             ADL Overall ADL's : Needs assistance/impaired                                       General ADL Comments: Pt not following any commands to perform simple grooming tasks at bed level or seated EOB, however pt following commands to squeeze OT's hand to to stick out tongue to indicate "yes" for answering questions      Vision  no change from baseline                              Cognition   Behavior During Therapy: Flat affect Overall Cognitive Status: History of cognitive impairments - at baseline                                                  General Comments  pt pleasant, daughter very supportive    Pertinent Vitals/ Pain       Pain Assessment: Faces Pain Location: with mobility of L LE towards EOB in prep for sitting Pain Descriptors / Indicators: Grimacing;Moaning Pain Intervention(s): Limited activity within patient's tolerance;Monitored during session;Repositioned                                                          Frequency Min 2X/week     Progress Toward Goals  OT Goals(current goals can now be found in the care plan section)  Progress towards OT goals: OT to reassess next treatment  Plan Discharge plan remains appropriate                     End of Session     Activity Tolerance Patient limited by fatigue;Patient limited by pain   Patient Left with family/visitor present;in bed;with bed alarm set;with call bell/phone within reach;with nursing/sitter in room   Nurse Communication  RN present and assisted OT this session        Time: [id number] OT Time Calculation (min): 19 min  Charges: OT General Charges $OT Visit: 1 Procedure OT Treatments $Therapeutic Activity: 8-22 mins  [name], [name] 11/26/2015, 2:43 PM

## 2015-11-26 NOTE — Progress Notes (Signed)
Pt observed to have a DNR code status but not reflected on epic, Dr Wynetta Emery paged to clarify, confirmed DNR status and iv fluids ordered as pt's family voiced a concern during day shift  that pt might be dehydrated due to insufficient oral intake, pt calm in bed at this time, will however continue to montor. Obasogie-Asidi, [name]

## 2015-11-27 ENCOUNTER — Non-Acute Institutional Stay (SKILLED_NURSING_FACILITY): Payer: Medicare Other | Admitting: Adult Health

## 2015-11-27 ENCOUNTER — Encounter: Payer: Self-pay | Admitting: Adult Health

## 2015-11-27 DIAGNOSIS — G43009 Migraine without aura, not intractable, without status migrainosus: Secondary | ICD-10-CM | POA: Diagnosis not present

## 2015-11-27 DIAGNOSIS — K5901 Slow transit constipation: Secondary | ICD-10-CM | POA: Diagnosis not present

## 2015-11-27 DIAGNOSIS — E559 Vitamin D deficiency, unspecified: Secondary | ICD-10-CM

## 2015-11-27 DIAGNOSIS — I10 Essential (primary) hypertension: Secondary | ICD-10-CM | POA: Diagnosis not present

## 2015-11-27 DIAGNOSIS — R531 Weakness: Secondary | ICD-10-CM

## 2015-11-27 DIAGNOSIS — N3281 Overactive bladder: Secondary | ICD-10-CM | POA: Diagnosis not present

## 2015-11-27 DIAGNOSIS — I480 Paroxysmal atrial fibrillation: Secondary | ICD-10-CM | POA: Diagnosis not present

## 2015-11-27 DIAGNOSIS — E46 Unspecified protein-calorie malnutrition: Secondary | ICD-10-CM | POA: Diagnosis not present

## 2015-11-27 DIAGNOSIS — I639 Cerebral infarction, unspecified: Secondary | ICD-10-CM

## 2015-11-27 DIAGNOSIS — J309 Allergic rhinitis, unspecified: Secondary | ICD-10-CM

## 2015-11-27 DIAGNOSIS — H1013 Acute atopic conjunctivitis, bilateral: Secondary | ICD-10-CM

## 2015-11-27 NOTE — Progress Notes (Signed)
Patient ID: [name], female   DOB: [birth year], 83 y.o.   MRN: [id number]    DATE:  11/27/2015   MRN:  [id number]  BIRTHDAY: Mar 09, 1925  Facility:  Nursing Home Location:  [place] Room Number: 203-B  LEVEL OF CARE:  SNF [phone])  Contact Information    Name Relation Home Work [place] Daughter [phone]  [phone]   Plummer,Kim Daughter [phone]   Serna,Kit Daughter [phone]  [phone]       Code Status History    Date Active Date Inactive Code Status Order ID Comments User Context   11/26/2015 12:26 AM 11/26/2015  9:47 PM DNR [id number]  Asencion Partridge, MD Inpatient   11/24/2015 12:38 AM 11/24/2015  1:59 PM DNR [id number]  Maryellen Pile, MD Inpatient   08/07/2015  4:52 AM 08/12/2015  8:32 PM Partial Code [id number]  Edwin Dada, MD Inpatient   12/18/2014  7:17 PM 12/21/2014  2:11 PM Partial Code [id number]  Willia Craze, NP Inpatient   10/31/2014  3:32 AM 10/31/2014  7:24 PM Full Code [id number]  Theressa Millard, MD Inpatient   08/28/2013 12:58 AM 08/30/2013  4:40 PM Full Code [id number]  Kelvin Cellar, MD Inpatient   03/09/2013  6:50 PM 03/12/2013  6:27 PM Full Code [id number]  Verlee Monte, MD Inpatient   02/15/2013  6:26 PM 03/03/2013 12:40 PM Full Code [id number]  Cathlyn Parsons, PA-C Inpatient   02/12/2013 12:50 PM 02/15/2013  6:26 PM Full Code [id number]  Velvet Bathe, MD Inpatient   02/05/2013  7:35 PM 02/07/2013  8:48 PM Full Code [id number]  Kelvin Cellar, MD Inpatient   08/01/2012  5:49 PM 08/03/2012  2:28 PM Full Code [id number]  Melton Alar, PA-C Inpatient    Questions for Most Recent Historical Code Status (Order [id number])    Question Answer Comment   In the event of cardiac or respiratory ARREST Do not call a "code blue"    In the event of cardiac or respiratory ARREST Do not perform Intubation, CPR, defibrillation or ACLS    In the event of cardiac or respiratory ARREST Use  medication by any route, position, wound care, and other measures to relive pain and suffering. May use oxygen, suction and manual treatment of airway obstruction as needed for comfort.         Advance Directive Documentation   Flowsheet Row Most Recent Value  Type of Advance Directive  Out of facility DNR (pink MOST or yellow form)  Pre-existing out of facility DNR order (yellow form or pink MOST form)  No data  "MOST" Form in Place?  No data       Chief Complaint  Patient presents with  . Hospitalization Follow-up    HISTORY OF PRESENT ILLNESS:  This is an 82 year old female who has been admitted to [facility] on 11/26/15 from [facility]. She was having RUE pronator drift and proximal RLE weakness.  A CT on arrival was negative for acute infarct but MRI was positive for left medial frontal frontal ACA infarct. CVA was thought to be due to underlying Afib. She was not a candidate for TPA due to minor signs, age >44 and current use of oral anticoagulant. She has a history of CVAs with residual left sided weakness and PAF on Eliquis 2.5 mg BID. Eliquis was increased from 2.5 mg to 5 mg BID, started on Amlodipine 2.5 mg daily  She has been admitted for a  short-term rehabilitation.  She was seen in her room with 2 daughters @ bedside. She was reported by daughters to be constipated.  PAST MEDICAL HISTORY:  Past Medical History:  Diagnosis Date  . Acute cystitis without hematuria   . Acute encephalopathy   . Arthritis   . Atrial fibrillation ([place])    on Eliquis Rx  . Atrial flutter ([place]) January, 2012  . Chronic anticoagulation   . Colon polyp   . Constipation   . Hearing difficulty   . Hemorrhoids   . High cholesterol   . HLD (hyperlipidemia)   . Hypertension   . Left leg weakness 02/05/2013  . Left-sided weakness   . Malnutrition ([place])   . Migraine   . Mild cognitive impairment with memory loss   . Osteoporosis   . Stroke ([place]) 08/02/12    right lenticular  nucleus infarct  . TIA (transient ischemic attack)   . Torus palatinus      CURRENT MEDICATIONS: Reviewed  Patient's Medications  New Prescriptions   No medications on file  Previous Medications   ACETAMINOPHEN (TYLENOL) 325 MG TABLET    Take 2 tablets (650 mg total) by mouth every 4 (four) hours as needed for mild pain (or temp >/= 99.5 F).   AMLODIPINE (NORVASC) 2.5 MG TABLET    Take 1 tablet (2.5 mg total) by mouth daily.   APIXABAN (ELIQUIS) 5 MG TABS TABLET    Take 1 tablet (5 mg total) by mouth 2 (two) times daily.   ATORVASTATIN (LIPITOR) 40 MG TABLET    Take 1 tablet (40 mg total) by mouth daily at 6 PM.   B COMPLEX VITAMINS TABLET    Take 1 tablet by mouth 2 (two) times daily.    CALCIUM CARB-CHOLECALCIFEROL (CALCIUM 600 + D) 600-200 MG-UNIT TABS    Take 1 tablet by mouth 2 (two) times daily.   CETIRIZINE (ZYRTEC) 10 MG TABLET    Take 10 mg by mouth at bedtime.   CHOLECALCIFEROL (VITAMIN D3) 2000 UNITS TABS    Take 1 tablet by mouth daily.   CRANBERRY 250 MG CAPS    Take 2 capsules by mouth daily.    DIVALPROEX (DEPAKOTE) 125 MG DR TABLET    Take 2 tablets (250 mg total) by mouth 2 (two) times daily.   DOCUSATE SODIUM (COLACE) 100 MG CAPSULE    Take 100 mg by mouth 2 (two) times daily.   HYDROCORTISONE (ANUSOL-HC) 2.5 % RECTAL CREAM    Place 1 application rectally as needed for hemorrhoids or itching.   HYDROCORTISONE (ANUSOL-HC) 25 MG SUPPOSITORY    Place 25 mg rectally as needed for hemorrhoids or itching.   LORAZEPAM (ATIVAN) 0.5 MG TABLET    Take 1 tablet (0.5 mg total) by mouth every 8 (eight) hours as needed for anxiety.   MELATONIN 3 MG TABS    Take 1 tablet by mouth at bedtime   MIRABEGRON ER (MYRBETRIQ) 50 MG TB24 TABLET    Take 1 tablet (50 mg total) by mouth daily.   MULTIPLE VITAMIN (MULTIVITAMIN WITH MINERALS) TABS    Take 1 tablet by mouth daily.   OLOPATADINE (PATANOL) 0.1 % OPHTHALMIC SOLUTION    PLACE 1 DROP INTO BOTH EYES 2 (TWO) TIMES DAILY.   ONDANSETRON  (ZOFRAN) 4 MG TABLET    Take 1 tablet (4 mg total) by mouth every 6 (six) hours as needed for nausea.   POLYETHYLENE GLYCOL (MIRALAX / GLYCOLAX) PACKET    Take 17 g by mouth  daily as needed for moderate constipation.  Modified Medications   No medications on file  Discontinued Medications   No medications on file     Allergies  Allergen Reactions  . Aricept [Donepezil Hcl] Hypertension    With N/V  . Tramadol Hcl Other (See Comments)     lethargy, nausea  . Alendronate Sodium Rash and Other (See Comments)    puffy eyes     REVIEW OF SYSTEMS:  Unable to obtain due to being mostly non-verbal    PHYSICAL EXAMINATION  GENERAL APPEARANCE: In no acute distress. Normal body habitus SKIN:  Skin is warm and dry.  HEAD: Normal in size and contour. No evidence of trauma EYES: Lids open and close normally. No blepharitis, entropion or ectropion. PERRL. Conjunctivae are clear and sclerae are white. Lenses are without opacity EARS: Pinnae are normal. Patient hears normal voice tunes of the examiner MOUTH and THROAT: Lips are without lesions. Oral mucosa is moist and without lesions. Tongue is normal in shape, size, and color and without lesions NECK: supple, trachea midline, no neck masses, no thyroid tenderness, no thyromegaly LYMPHATICS: no LAN in the neck, no supraclavicular LAN RESPIRATORY: breathing is even & unlabored, BS CTAB CARDIAC: irregularly irregular, + murmur,no extra heart sounds, no edema GI: abdomen soft, normal BS, no masses, no tenderness, no hepatomegaly, no splenomegaly EXTREMITIES:  Able to move X 4 extremities but has generalized weakness PSYCHIATRIC: Mostly non-verbal but able to answer what she usually eats for breakfast. Affect and behavior are appropriate  LABS/RADIOLOGY: Labs reviewed: Basic Metabolic Panel:  Recent Labs  08/08/15 0539  08/09/15 0633  09/12/15 1250  11/24/15 0416 11/25/15 0523 11/26/15 0420  NA 148*  < > 144  < > 142  < > 143 141 139   K 4.0  < > 4.1  < > 4.2  < > 3.8 4.3 4.4  CL 108  < > 109  < > 104  < > 111 110 108  CO2 29  < > 25  < > 31  < > _0 GLUCOSE 95  < > 102*  < > 89  < > 96 101* 114*  BUN 9  < > 10  < > 15  < > _1 CREATININE 0.74  < > 0.76  < > 0.72  < > 0.82 0.60 0.67  CALCIUM 8.7*  < > 8.3*  < > 9.1  < > 8.3* 8.3* 8.5*  MG 2.1  --  2.0  --  1.9  --   --   --   --   < > = values in this interval not displayed. Liver Function Tests:  Recent Labs  08/08/15 0539 09/12/15 1250 11/23/15 2055  AST _2 ALT 12* 11* 13*  ALKPHOS 43 49 61  BILITOT 0.6 0.4 0.4  PROT 6.0* 6.4* 6.0*  ALBUMIN 3.3* 3.4* 3.3*    Recent Labs  06/23/15 1643 08/08/15 1303  AMMONIA 21 16   CBC:  Recent Labs  09/12/15 1250 09/13/15 1618 11/23/15 2055 11/23/15 2116 11/25/15 0523 11/26/15 0420  WBC 5.6 5.2 4.9  --  5.4 8.2  NEUTROABS 2.9 2.8 2.6  --   --   --   HGB 12.4 12.6 12.3 12.6 11.9* 12.3  HCT 37.4 37.9 38.9 37.0 36.9 37.9  MCV 94.2 94.3 96.5  --  93.7 94.0  PLT 180 177 183  --  186 185   Lipid Panel:  Recent Labs  12/19/14 0433 08/07/15 0551 11/24/15 0417  HDL 47 47 48   Cardiac Enzymes:  Recent Labs  01/01/15 1311 07/31/15 1115 09/12/15 1250  TROPONINI <0.03 <0.03 <0.03   CBG:  Recent Labs  06/23/15 1715 08/07/15 0202 11/23/15 2111  GLUCAP 79 102* 154*      Mr Brain Wo Contrast (neuro Protocol)  Result Date: 11/23/2015 CLINICAL DATA:  Acute onset RIGHT-sided weakness, LEFT supraorbital headache. History of stroke, hypertension, hyperlipidemia, migraines, dementia, atrial fibrillation on Eliquis. EXAM: MRI HEAD WITHOUT CONTRAST TECHNIQUE: Multiplanar, multiecho pulse sequences of the brain and surrounding structures were obtained without intravenous contrast. COMPARISON:  CT HEAD November 23, 2015 at 2052 hours and MRI of the head Aug 07, 2015 FINDINGS: INTRACRANIAL CONTENTS: No patchy reduced diffusion measuring up to 24 x 7 mm in LEFT mesial frontal lobe extending to  the cortex, corresponding low ADC values. No susceptibility artifact to suggest hemorrhage. Old cystic RIGHT basal ganglia lacunar infarct with mild ex vacuo dilatation RIGHT lateral ventricle. Moderate to severe general ventriculomegaly on the basis of global parenchymal brain volume loss. Old small RIGHT cerebellar infarcts. LEFT parietal encephalomalacia the RIGHT cerebral peduncle volume loss compatible with wallerian degeneration. Confluent supratentorial and pontine white matter FLAIR T2 hyperintensities. No midline shift, mass effect or masses. ORBITS: The included ocular globes and orbital contents are non-suspicious. Status post bilateral ocular lens implants. SINUSES: The mastoid air-cells and included paranasal sinuses are well-aerated. SKULL/SOFT TISSUES: No abnormal sellar expansion. No suspicious calvarial bone marrow signal. Generalized bright T1 calvarial bone marrow signal most compatible with osteopenia. Craniocervical junction maintained. IMPRESSION: Acute patchy LEFT anterior cerebral artery territory nonhemorrhagic infarct. Otherwise stable examination including old RIGHT basal ganglia lacunar infarct, old LEFT parietal lobe infarct, old small RIGHT cerebellar infarcts and severe chronic small vessel ischemic disease. Moderate to severe global brain atrophy. Electronically Signed   By: Elon Alas M.D.   On: 11/23/2015 23:39   Ct Head Code Stroke W/o Cm  Result Date: 11/23/2015 CLINICAL DATA:  Code stroke. 82 y/o F; right-sided weakness, headache, and left thigh pain. History of cerebral vascular accident. EXAM: CT HEAD WITHOUT CONTRAST TECHNIQUE: Contiguous axial images were obtained from the base of the skull through the vertex without intravenous contrast. COMPARISON:  09/12/2015 CT head.  08/07/2015 MRI brain. FINDINGS: No large acute infarct, focal mass effect, intracranial hemorrhage, hydrocephalus, or hyperdense vessel is identified. Nonspecific confluent hypoattenuation of  subcortical and periventricular white matter corresponds to FLAIR signal abnormality on prior MR and is stable, likely representing moderate chronic microvascular ischemic changes. Mild volume loss for age. Chronic right lentiform nucleus, right cerebellar hemisphere, and left parietal infarcts are stable. Visualized paranasal sinuses and mastoids are normally aerated. Orbits are unremarkable. Bilateral intra-ocular lens replacement. Extensive calcific atherosclerosis of cavernous internal carotid arteries. Calvarium is unremarkable. ASPECTS [facility] Stroke Program Early CT Score) - Ganglionic level infarction (caudate, lentiform nuclei, internal capsule, insula, M1-M3 cortex): 7 - Supraganglionic infarction (M4-M6 cortex): 3 Total score (0-10 with 10 being normal): 10 IMPRESSION: 1. No acute intracranial abnormality is identified. If symptoms persist or if clinically indicated MRI is more sensitive for acute stroke. 2. ASPECTS is 10 3. Stable chronic microvascular ischemic changes, parenchymal volume loss, and chronic infarcts in the right lentiform nucleus, left parietal lobe, and right cerebellum. These results were called by telephone at the time of interpretation on 11/23/2015 at 9:08 pm to Dr. Charlesetta Shanks , who verbally acknowledged these results. Electronically Signed   By: Edgardo Roys.D.  On: 11/23/2015 21:09    ASSESSMENT/PLAN:  Generalized weakness - for rehabilitation, PT and OT, for therapeutic strengthening exercises  Acute CVA - for rehabilitation, PT and OT, for therapeutic strengthening exercises; follow-up with neurology, Dr. Leonie Man, in 2 months; Eliquis was increased to 5 mg BID, continue Lipitor 40 mg Q HS; CBC in 1 week  PAF - rate-controlled; Eliquis was increased to 5 mg BID  Allergic rhinitis - continue Zyrtec 10 mg PO Q HS  Migraine - continue Depakote 125 mg DR give 2 tabs = 250 mg BID  Constipation - discontinue Colace and Miralax PRN, start Senna-S 2 tabs PO  BID and Miralax 17 gm BID  Allergic Conjunctivitis - continue Patanol 0.1% 1 gtt to both eyes BID  Vitamin D deficiency - continue Vitamin D3 2,000 units 1 tab daily  Protein-calorie malnutrition albumin 3.3; start Procel 1 scoop BID  Hypertension - recently started on Norvasc 2.5 mg PO daily; check BMP in 1 week  OAB - continue Myrbetriq ER 50 mg daily     Goals of care:  Short-term rehabilitation    Durenda Age, NP [place] [phone]

## 2015-11-28 ENCOUNTER — Encounter: Payer: Self-pay | Admitting: Internal Medicine

## 2015-11-28 ENCOUNTER — Non-Acute Institutional Stay (SKILLED_NURSING_FACILITY): Payer: Medicare Other | Admitting: Internal Medicine

## 2015-11-28 DIAGNOSIS — M7989 Other specified soft tissue disorders: Secondary | ICD-10-CM | POA: Diagnosis not present

## 2015-11-28 DIAGNOSIS — M7981 Nontraumatic hematoma of soft tissue: Secondary | ICD-10-CM | POA: Diagnosis not present

## 2015-11-28 DIAGNOSIS — K5901 Slow transit constipation: Secondary | ICD-10-CM

## 2015-11-28 DIAGNOSIS — R2689 Other abnormalities of gait and mobility: Secondary | ICD-10-CM

## 2015-11-28 DIAGNOSIS — I69391 Dysphagia following cerebral infarction: Secondary | ICD-10-CM

## 2015-11-28 DIAGNOSIS — M25531 Pain in right wrist: Secondary | ICD-10-CM

## 2015-11-28 DIAGNOSIS — E441 Mild protein-calorie malnutrition: Secondary | ICD-10-CM | POA: Diagnosis not present

## 2015-11-28 DIAGNOSIS — R531 Weakness: Secondary | ICD-10-CM | POA: Diagnosis not present

## 2015-11-28 DIAGNOSIS — I480 Paroxysmal atrial fibrillation: Secondary | ICD-10-CM | POA: Diagnosis not present

## 2015-11-28 DIAGNOSIS — L0390 Cellulitis, unspecified: Secondary | ICD-10-CM | POA: Diagnosis not present

## 2015-11-28 DIAGNOSIS — L03113 Cellulitis of right upper limb: Secondary | ICD-10-CM | POA: Diagnosis not present

## 2015-11-28 DIAGNOSIS — J189 Pneumonia, unspecified organism: Secondary | ICD-10-CM | POA: Diagnosis not present

## 2015-11-28 DIAGNOSIS — M150 Primary generalized (osteo)arthritis: Secondary | ICD-10-CM | POA: Diagnosis not present

## 2015-11-28 DIAGNOSIS — F4329 Adjustment disorder with other symptoms: Secondary | ICD-10-CM | POA: Diagnosis not present

## 2015-11-28 DIAGNOSIS — R509 Fever, unspecified: Secondary | ICD-10-CM | POA: Diagnosis not present

## 2015-11-28 DIAGNOSIS — R488 Other symbolic dysfunctions: Secondary | ICD-10-CM | POA: Diagnosis not present

## 2015-11-28 DIAGNOSIS — N3281 Overactive bladder: Secondary | ICD-10-CM

## 2015-11-28 DIAGNOSIS — E559 Vitamin D deficiency, unspecified: Secondary | ICD-10-CM | POA: Diagnosis not present

## 2015-11-28 DIAGNOSIS — E46 Unspecified protein-calorie malnutrition: Secondary | ICD-10-CM | POA: Diagnosis not present

## 2015-11-28 DIAGNOSIS — Z79899 Other long term (current) drug therapy: Secondary | ICD-10-CM | POA: Diagnosis not present

## 2015-11-28 DIAGNOSIS — R0989 Other specified symptoms and signs involving the circulatory and respiratory systems: Secondary | ICD-10-CM | POA: Diagnosis not present

## 2015-11-28 DIAGNOSIS — I69319 Unspecified symptoms and signs involving cognitive functions following cerebral infarction: Secondary | ICD-10-CM | POA: Diagnosis not present

## 2015-11-28 DIAGNOSIS — M6281 Muscle weakness (generalized): Secondary | ICD-10-CM | POA: Diagnosis not present

## 2015-11-28 DIAGNOSIS — D649 Anemia, unspecified: Secondary | ICD-10-CM | POA: Diagnosis not present

## 2015-11-28 DIAGNOSIS — Z5189 Encounter for other specified aftercare: Secondary | ICD-10-CM | POA: Diagnosis not present

## 2015-11-28 DIAGNOSIS — I639 Cerebral infarction, unspecified: Secondary | ICD-10-CM | POA: Diagnosis not present

## 2015-11-28 DIAGNOSIS — I6990 Unspecified sequelae of unspecified cerebrovascular disease: Secondary | ICD-10-CM | POA: Diagnosis not present

## 2015-11-28 DIAGNOSIS — R600 Localized edema: Secondary | ICD-10-CM | POA: Diagnosis not present

## 2015-11-28 DIAGNOSIS — R5381 Other malaise: Secondary | ICD-10-CM

## 2015-11-28 DIAGNOSIS — I10 Essential (primary) hypertension: Secondary | ICD-10-CM

## 2015-11-28 DIAGNOSIS — R05 Cough: Secondary | ICD-10-CM | POA: Diagnosis not present

## 2015-11-28 DIAGNOSIS — J309 Allergic rhinitis, unspecified: Secondary | ICD-10-CM | POA: Diagnosis not present

## 2015-11-28 DIAGNOSIS — J209 Acute bronchitis, unspecified: Secondary | ICD-10-CM | POA: Diagnosis not present

## 2015-11-28 DIAGNOSIS — H1013 Acute atopic conjunctivitis, bilateral: Secondary | ICD-10-CM | POA: Diagnosis not present

## 2015-11-28 DIAGNOSIS — G4089 Other seizures: Secondary | ICD-10-CM | POA: Diagnosis not present

## 2015-11-28 DIAGNOSIS — G43009 Migraine without aura, not intractable, without status migrainosus: Secondary | ICD-10-CM | POA: Diagnosis not present

## 2015-11-28 DIAGNOSIS — I69154 Hemiplegia and hemiparesis following nontraumatic intracerebral hemorrhage affecting left non-dominant side: Secondary | ICD-10-CM | POA: Diagnosis not present

## 2015-11-28 DIAGNOSIS — R1312 Dysphagia, oropharyngeal phase: Secondary | ICD-10-CM | POA: Diagnosis not present

## 2015-11-28 NOTE — Progress Notes (Signed)
LOCATION: [place]  PCP: Gildardo Cranker, DO   Code Status: DNR  Goals of care: Advanced Directive information Advanced Directives 11/27/2015  Does patient have an advance directive? Yes  Type of Advance Directive Out of facility DNR (pink MOST or yellow form)  Does patient want to make changes to advanced directive? No - Patient declined  Copy of advanced directive(s) in chart? Yes  Would patient like information on creating an advanced directive? -  Pre-existing out of facility DNR order (yellow form or pink MOST form) -       Extended Emergency Contact Information Primary Emergency Contact: [facility] Address: [address] [place], [place] [address] [place] Phone: [phone] Mobile Phone: [phone] Relation: Daughter Secondary Emergency Contact: Plummer,Kim Address: [place], [place] Phone: [phone] Work Phone: [phone] Mobile Phone: [phone] Relation: Daughter   Allergies  Allergen Reactions  . Aricept [Donepezil Hcl] Hypertension    With N/V  . Tramadol Hcl Other (See Comments)     lethargy, nausea  . Alendronate Sodium Rash and Other (See Comments)    puffy eyes    Chief Complaint  Patient presents with  . New Admit To SNF    New Admission     HPI:  Patient is a 82 y.o. female seen today for short term rehabilitation post hospital admission from 11/23/15 - 11/26/15 with acute CVA with right sided weakness. Brain MRI was positive for left medial frontal frontal ACA infarct. She was seen by neurology and her eliquis dosing was increased. She also had elevated BP reading and was started on amlodipine. She is seen in her room today with her 2 daughters at bedside. She is requiring 2 person maximum assist with transfers at present. She does not participate in HPI and ROS. She has PMH of CVA with left sided weakness, afib, HLD, osteoporosis among others. Per  staff, she has been moaning and resisting movement/ transfers. She had a bowel movement today. She has swelling to her right hand and wrist area.    Review of Systems: unable to obtain.  Past Medical History:  Diagnosis Date  . Acute cystitis without hematuria   . Acute encephalopathy   . Arthritis   . Atrial fibrillation ([place])    on Eliquis Rx  . Atrial flutter ([place]) January, 2012  . Chronic anticoagulation   . Colon polyp   . Constipation   . Hearing difficulty   . Hemorrhoids   . High cholesterol   . HLD (hyperlipidemia)   . Hypertension   . Left leg weakness 02/05/2013  . Left-sided weakness   . Malnutrition ([place])   . Migraine   . Mild cognitive impairment with memory loss   . Osteoporosis   . Stroke ([place]) 08/02/12    right lenticular nucleus infarct  . TIA (transient ischemic attack)   . Torus palatinus    Past Surgical History:  Procedure Laterality Date  . APPENDECTOMY  02/19/53   `  . CARDIOVERSION  05/19/2010   Dr. Einar Gip  . CATARACT EXTRACTION W/ INTRAOCULAR LENS  IMPLANT, BILATERAL  2006-2008  . GANGLION CYST EXCISION Bilateral 1938,1954,2003,2005   "wrists/hand" (08/01/2012)  . MOUTH SURGERY     [name]  . TONSILLECTOMY  ~ 1935   Social History:   reports that she has quit smoking. She has never used smokeless tobacco.  She reports that she does not drink alcohol or use drugs.  Family History  Problem Relation Age of Onset  . Cancer Mother     breast  . Aneurysm Mother     brain  . Stroke Mother   . Colon cancer Father   . Breast cancer    . Brain cancer    . Heart attack Neg Hx   . Diabetes Neg Hx   . Hypertension Neg Hx     Medications:   Medication List       Accurate as of 11/28/15  3:08 PM. Always use your most recent med list.          acetaminophen 325 MG tablet Commonly known as:  TYLENOL Take 2 tablets (650 mg total) by mouth every 4 (four) hours as needed for mild pain (or temp >/= 99.5 F).   amLODipine 2.5 MG tablet Commonly  known as:  NORVASC Take 1 tablet (2.5 mg total) by mouth daily.   apixaban 5 MG Tabs tablet Commonly known as:  ELIQUIS Take 1 tablet (5 mg total) by mouth 2 (two) times daily.   atorvastatin 40 MG tablet Commonly known as:  LIPITOR Take 1 tablet (40 mg total) by mouth daily at 6 PM.   b complex vitamins tablet Take 1 tablet by mouth 2 (two) times daily.   CALCIUM 600 + D 600-200 MG-UNIT Tabs Generic drug:  Calcium Carb-Cholecalciferol Take 1 tablet by mouth 2 (two) times daily.   cetirizine 10 MG tablet Commonly known as:  ZYRTEC Take 10 mg by mouth at bedtime.   Cranberry 250 MG Caps Take 2 capsules by mouth daily.   divalproex 125 MG DR tablet Commonly known as:  DEPAKOTE Take 2 tablets (250 mg total) by mouth 2 (two) times daily.   hydrocortisone 2.5 % rectal cream Commonly known as:  ANUSOL-HC Place 1 application rectally as needed for hemorrhoids or itching.   hydrocortisone 25 MG suppository Commonly known as:  ANUSOL-HC Place 25 mg rectally as needed for hemorrhoids or itching.   LORazepam 0.5 MG tablet Commonly known as:  ATIVAN Take 1 tablet (0.5 mg total) by mouth every 8 (eight) hours as needed for anxiety.   Melatonin 3 MG Tabs Take 1 tablet by mouth at bedtime   mirabegron ER 50 MG Tb24 tablet Commonly known as:  MYRBETRIQ Take 1 tablet (50 mg total) by mouth daily.   multivitamin with minerals Tabs tablet Take 1 tablet by mouth daily.   olopatadine 0.1 % ophthalmic solution Commonly known as:  PATANOL PLACE 1 DROP INTO BOTH EYES 2 (TWO) TIMES DAILY.   ondansetron 4 MG tablet Commonly known as:  ZOFRAN Take 1 tablet (4 mg total) by mouth every 6 (six) hours as needed for nausea.   polyethylene glycol packet Commonly known as:  MIRALAX / GLYCOLAX Take 17 g by mouth 2 (two) times daily.   PROCEL Powd Take 1 scoop by mouth 2 (two) times daily.   sennosides-docusate sodium 8.6-50 MG tablet Commonly known as:  SENOKOT-S Take 2 tablets by  mouth 2 (two) times daily.   Vitamin D3 2000 units Tabs Take 1 tablet by mouth daily.       Immunizations: Immunization History  Administered Date(s) Administered  . Influenza Split 01/18/2011  . Influenza Whole 02/10/2009, 12/09/2009  . Influenza, High Dose Seasonal PF 01/26/2013  . Influenza,inj,Quad PF,36+ Mos 11/27/2013, 12/11/2014  . PPD Test 09/20/2014, 08/13/2015, 08/26/2015  . Pneumococcal Conjugate-13 10/19/2013  . Pneumococcal Polysaccharide-23 05/06/2009  .  Td 02/05/2008  . Zoster 01/05/2014     Physical Exam: Vitals:   11/28/15 1501  BP: (!) 138/58  Pulse: 76  Resp: 18  Temp: 99.3 F (37.4 C)  TempSrc: Oral  SpO2: 98%  Weight: 139 lb (63 kg)  Height: 5\' 6"  (1.676 m)   Body mass index is 22.44 kg/m.  General- elderly female, frail and thin built, in no acute distress Head- normocephalic, atraumatic Nose- no nasal discharge Throat- moist mucus membrane Eyes- PERRLA, EOMI, no pallor, no icterus, no discharge, normal conjunctiva, normal sclera Neck- no cervical lymphadenopathy Cardiovascular- normal s1,s2, no murmur, 1+ leg edema Respiratory- bilateral clear to auscultation, no wheeze, no rhonchi, no crackles, no use of accessory muscles Abdomen- bowel sounds present, soft, non tender Musculoskeletal- generalized weakness present, 2 person maximum assist with transfers, needs assistance with feeding, swelling to right hand and wrist with redness and mild warmth Neurological- not participating in conversation Skin- warm and dry, bruise to both arms Psychiatry- unable to assess   Labs reviewed: Basic Metabolic Panel:  Recent Labs  08/08/15 0539  08/09/15 0633  09/12/15 1250  11/24/15 0416 11/25/15 0523 11/26/15 0420  NA 148*  < > 144  < > 142  < > 143 141 139  K 4.0  < > 4.1  < > 4.2  < > 3.8 4.3 4.4  CL 108  < > 109  < > 104  < > 111 110 108  CO2 29  < > 25  < > 31  < > 25 26 25   GLUCOSE 95  < > 102*  < > 89  < > 96 101* 114*  BUN 9  < > 10   < > 15  < > 15 10 15   CREATININE 0.74  < > 0.76  < > 0.72  < > 0.82 0.60 0.67  CALCIUM 8.7*  < > 8.3*  < > 9.1  < > 8.3* 8.3* 8.5*  MG 2.1  --  2.0  --  1.9  --   --   --   --   < > = values in this interval not displayed. Liver Function Tests:  Recent Labs  08/08/15 0539 09/12/15 1250 11/23/15 2055  AST 23 21 24   ALT 12* 11* 13*  ALKPHOS 43 49 61  BILITOT 0.6 0.4 0.4  PROT 6.0* 6.4* 6.0*  ALBUMIN 3.3* 3.4* 3.3*   No results for input(s): LIPASE, AMYLASE in the last 8760 hours.  Recent Labs  06/23/15 1643 08/08/15 1303  AMMONIA 21 16   CBC:  Recent Labs  09/12/15 1250 09/13/15 1618 11/23/15 2055 11/23/15 2116 11/25/15 0523 11/26/15 0420  WBC 5.6 5.2 4.9  --  5.4 8.2  NEUTROABS 2.9 2.8 2.6  --   --   --   HGB 12.4 12.6 12.3 12.6 11.9* 12.3  HCT 37.4 37.9 38.9 37.0 36.9 37.9  MCV 94.2 94.3 96.5  --  93.7 94.0  PLT 180 177 183  --  186 185   Cardiac Enzymes:  Recent Labs  01/01/15 1311 07/31/15 1115 09/12/15 1250  TROPONINI <0.03 <0.03 <0.03   BNP: Invalid input(s): POCBNP CBG:  Recent Labs  06/23/15 1715 08/07/15 0202 11/23/15 2111  GLUCAP 79 102* 154*    Radiological Exams: Mr Brain Wo Contrast (neuro Protocol)  Result Date: 11/23/2015 CLINICAL DATA:  Acute onset RIGHT-sided weakness, LEFT supraorbital headache. History of stroke, hypertension, hyperlipidemia, migraines, dementia, atrial fibrillation on Eliquis. EXAM: MRI HEAD WITHOUT CONTRAST TECHNIQUE: Multiplanar, multiecho  pulse sequences of the brain and surrounding structures were obtained without intravenous contrast. COMPARISON:  CT HEAD November 23, 2015 at 2052 hours and MRI of the head Aug 07, 2015 FINDINGS: INTRACRANIAL CONTENTS: No patchy reduced diffusion measuring up to 24 x 7 mm in LEFT mesial frontal lobe extending to the cortex, corresponding low ADC values. No susceptibility artifact to suggest hemorrhage. Old cystic RIGHT basal ganglia lacunar infarct with mild ex vacuo dilatation  RIGHT lateral ventricle. Moderate to severe general ventriculomegaly on the basis of global parenchymal brain volume loss. Old small RIGHT cerebellar infarcts. LEFT parietal encephalomalacia the RIGHT cerebral peduncle volume loss compatible with wallerian degeneration. Confluent supratentorial and pontine white matter FLAIR T2 hyperintensities. No midline shift, mass effect or masses. ORBITS: The included ocular globes and orbital contents are non-suspicious. Status post bilateral ocular lens implants. SINUSES: The mastoid air-cells and included paranasal sinuses are well-aerated. SKULL/SOFT TISSUES: No abnormal sellar expansion. No suspicious calvarial bone marrow signal. Generalized bright T1 calvarial bone marrow signal most compatible with osteopenia. Craniocervical junction maintained. IMPRESSION: Acute patchy LEFT anterior cerebral artery territory nonhemorrhagic infarct. Otherwise stable examination including old RIGHT basal ganglia lacunar infarct, old LEFT parietal lobe infarct, old small RIGHT cerebellar infarcts and severe chronic small vessel ischemic disease. Moderate to severe global brain atrophy. Electronically Signed   By: Elon Alas M.D.   On: 11/23/2015 23:39   Ct Head Code Stroke W/o Cm  Result Date: 11/23/2015 CLINICAL DATA:  Code stroke. 82 y/o F; right-sided weakness, headache, and left thigh pain. History of cerebral vascular accident. EXAM: CT HEAD WITHOUT CONTRAST TECHNIQUE: Contiguous axial images were obtained from the base of the skull through the vertex without intravenous contrast. COMPARISON:  09/12/2015 CT head.  08/07/2015 MRI brain. FINDINGS: No large acute infarct, focal mass effect, intracranial hemorrhage, hydrocephalus, or hyperdense vessel is identified. Nonspecific confluent hypoattenuation of subcortical and periventricular white matter corresponds to FLAIR signal abnormality on prior MR and is stable, likely representing moderate chronic microvascular ischemic  changes. Mild volume loss for age. Chronic right lentiform nucleus, right cerebellar hemisphere, and left parietal infarcts are stable. Visualized paranasal sinuses and mastoids are normally aerated. Orbits are unremarkable. Bilateral intra-ocular lens replacement. Extensive calcific atherosclerosis of cavernous internal carotid arteries. Calvarium is unremarkable. ASPECTS [facility] Stroke Program Early CT Score) - Ganglionic level infarction (caudate, lentiform nuclei, internal capsule, insula, M1-M3 cortex): 7 - Supraganglionic infarction (M4-M6 cortex): 3 Total score (0-10 with 10 being normal): 10 IMPRESSION: 1. No acute intracranial abnormality is identified. If symptoms persist or if clinically indicated MRI is more sensitive for acute stroke. 2. ASPECTS is 10 3. Stable chronic microvascular ischemic changes, parenchymal volume loss, and chronic infarcts in the right lentiform nucleus, left parietal lobe, and right cerebellum. These results were called by telephone at the time of interpretation on 11/23/2015 at 9:08 pm to Dr. Charlesetta Shanks , who verbally acknowledged these results. Electronically Signed   By: Kristine Garbe M.D.   On: 11/23/2015 21:09    Assessment/Plan  Physical deconditioning Will have her work with physical therapy and occupational therapy team to help with gait training and muscle strengthening exercises.fall precautions. Skin care. Encourage to be out of bed.   Decreased mobility Post CVA with both right and left sided weakness. Provide gel ovelay mattress for pressure ulcer prophylaxis. Patient to be under total care with her ADLs. Will need full assistance with feeding  Cognitive deficit With recent CVA and hx of CVA, possible component of vascular dementia present.  SLP to follow and provide supportive care  Dysphagia Assist with feed, aspiration precautions, SLP to follow  Acute CVA Continue eliquis 5 mg bid and lipitor 40 mg daily. Has neurology follow up.  High risk for another stroke  afib Rate controlled. Continue eliquis 5 mg bid for anticoagulation  Right wrist pain With ROM pain illicited. Get xray to rule out fracture. Start tylenol 500 mg tid x 5 days and then q8h prn pain. No known hx of gout. Check uric acid level and cbc with diff  Leg edema Add ted hose for now and monitor  Right hand swelling With redness noted at site of iv infiltration with concern for superficial thrombophlebitis. Ice pack and rest for now and monitor for signs of infection  Protein calorie malnutrition RD to evaluate, weekly weight. Continue procel for now  Constipation On senna s bid and miralax bid, had bowel movement today, change her senna s to qd and miralax to daily as well and monitor  Hypertension  Continue amlodipine 2.5 mg daily for now, check bp bid and monitor  OAB Continue her myrbetriq and monitor    Goals of care: short term rehabilitation, possible long term care   Labs/tests ordered: cbc, cmp, uric acid, xray right wrist  Family/ staff Communication: reviewed care plan with patient's daughters and nursing supervisor    Blanchie Serve, MD Internal Medicine [facility] [address] [place], [place] [address] Cell Phone (Monday-Friday 8 am - 5 pm): [phone] On Call: [phone] and follow prompts after 5 pm and on weekends Office Phone: [phone] Office Fax: [phone]

## 2015-12-03 ENCOUNTER — Ambulatory Visit: Payer: Medicare Other | Admitting: Internal Medicine

## 2015-12-04 LAB — BASIC METABOLIC PANEL
BUN: 20 mg/dL (ref 4–21)
Creatinine: 0.5 mg/dL (ref 0.5–1.1)
GLUCOSE: 109 mg/dL
POTASSIUM: 4.4 mmol/L (ref 3.4–5.3)
SODIUM: 144 mmol/L (ref 137–147)

## 2015-12-23 LAB — BASIC METABOLIC PANEL
BUN: 24 mg/dL — AB (ref 4–21)
CREATININE: 0.5 mg/dL (ref 0.5–1.1)
Glucose: 114 mg/dL
Potassium: 4.3 mmol/L (ref 3.4–5.3)
SODIUM: 142 mmol/L (ref 137–147)

## 2015-12-23 LAB — CBC AND DIFFERENTIAL
HCT: 35 % — AB (ref 36–46)
Hemoglobin: 11.1 g/dL — AB (ref 12.0–16.0)
NEUTROS ABS: 7 /uL
Platelets: 227 10*3/uL (ref 150–399)
WBC: 9 10^3/mL

## 2015-12-24 ENCOUNTER — Non-Acute Institutional Stay (SKILLED_NURSING_FACILITY): Payer: Medicare Other | Admitting: Nurse Practitioner

## 2015-12-24 ENCOUNTER — Encounter: Payer: Self-pay | Admitting: Nurse Practitioner

## 2015-12-24 DIAGNOSIS — J209 Acute bronchitis, unspecified: Secondary | ICD-10-CM

## 2015-12-24 DIAGNOSIS — L0390 Cellulitis, unspecified: Secondary | ICD-10-CM | POA: Diagnosis not present

## 2015-12-24 DIAGNOSIS — D649 Anemia, unspecified: Secondary | ICD-10-CM

## 2015-12-24 NOTE — Progress Notes (Signed)
Patient ID: [name], female   DOB: 12/13/1924, 82 y.o.   MRN: [id number]    Nursing Home Location:    [facility]  Place of Service: SNF (31)  PCP: Gildardo Cranker, DO  Allergies  Allergen Reactions  . Aricept [Donepezil Hcl] Hypertension    With N/V  . Tramadol Hcl Other (See Comments)     lethargy, nausea  . Alendronate Sodium Rash and Other (See Comments)    puffy eyes    Chief Complaint  Patient presents with  . Acute Visit    Right hand cellulitis    HPI:  Patient is a 82 y.o. female seen today at the request of nursing due to increase redness and tenderness to right wrist. Pt with a hx of CVA with left sided weakness, afib, hld, OP who is in rehab after hospitalization for acute CVA with right sided weakness. Pt was recently treated for right wrist cellulitis with keflex for 7 days starting on 11/29/15. Xray was negative for acute findings. Pt completed treatment and redness and tenderness improved. At this time it was from her wrist to her elbow per daughter. Currently she has right wrist redness with increase pain. Uric acid level, cbc, bmp were obtained. Uric acid level was WNL hgb noted to be down to 11.1 from 12.3- 1 month ago. No signs of blood loss.  Daughter reports increase cough and congestion which have been ongoing for several days. Runny nose started today.   Review of Systems:  Review of Systems  Unable to perform ROS: Dementia    Past Medical History:  Diagnosis Date  . Acute cystitis without hematuria   . Acute encephalopathy   . Arthritis   . Atrial fibrillation ([place])    on Eliquis Rx  . Atrial flutter ([place]) January, 2012  . Chronic anticoagulation   . Colon polyp   . Constipation   . Hearing difficulty   . Hemorrhoids   . High cholesterol   . HLD (hyperlipidemia)   . Hypertension   . Left leg weakness 02/05/2013  . Left-sided weakness   . Malnutrition ([place])   . Migraine   . Mild cognitive impairment with memory loss   . Osteoporosis    . Stroke ([place]) 08/02/12    right lenticular nucleus infarct  . TIA (transient ischemic attack)   . Torus palatinus    Past Surgical History:  Procedure Laterality Date  . APPENDECTOMY  02/19/53   `  . CARDIOVERSION  05/19/2010   Dr. Einar Gip  . CATARACT EXTRACTION W/ INTRAOCULAR LENS  IMPLANT, BILATERAL  2006-2008  . GANGLION CYST EXCISION Bilateral 1938,1954,2003,2005   "wrists/hand" (08/01/2012)  . MOUTH SURGERY     [name]  . TONSILLECTOMY  ~ 1935   Social History:   reports that she has quit smoking. She has never used smokeless tobacco. She reports that she does not drink alcohol or use drugs.  Family History  Problem Relation Age of Onset  . Cancer Mother     breast  . Aneurysm Mother     brain  . Stroke Mother   . Colon cancer Father   . Breast cancer    . Brain cancer    . Heart attack Neg Hx   . Diabetes Neg Hx   . Hypertension Neg Hx     Medications: Patient's Medications  New Prescriptions   No medications on file  Previous Medications   ACETAMINOPHEN (TYLENOL) 500 MG TABLET    Take 500 mg by mouth  every 8 (eight) hours as needed.   AMLODIPINE (NORVASC) 2.5 MG TABLET    Take 1 tablet (2.5 mg total) by mouth daily.   APIXABAN (ELIQUIS) 5 MG TABS TABLET    Take 1 tablet (5 mg total) by mouth 2 (two) times daily.   ATORVASTATIN (LIPITOR) 40 MG TABLET    Take 1 tablet (40 mg total) by mouth daily at 6 PM.   B COMPLEX VITAMINS TABLET    Take 1 tablet by mouth 2 (two) times daily.    CALCIUM CARB-CHOLECALCIFEROL (CALCIUM 600 + D) 600-200 MG-UNIT TABS    Take 1 tablet by mouth 2 (two) times daily.   CETIRIZINE (ZYRTEC) 10 MG TABLET    Take 10 mg by mouth at bedtime.    CHOLECALCIFEROL (VITAMIN D3) 2000 UNITS TABS    Take 1 tablet by mouth daily.   CRANBERRY 250 MG CAPS    Take 2 capsules by mouth daily.    DIVALPROEX (DEPAKOTE) 125 MG DR TABLET    Take 2 tablets (250 mg total) by mouth 2 (two) times daily.   HYDROCORTISONE (ANUSOL-HC) 2.5 % RECTAL CREAM    Place 1  application rectally as needed for hemorrhoids or itching.   HYDROCORTISONE (ANUSOL-HC) 25 MG SUPPOSITORY    Place 25 mg rectally as needed for hemorrhoids or itching.   LORAZEPAM (ATIVAN) 0.5 MG TABLET    Take 1 tablet (0.5 mg total) by mouth every 8 (eight) hours as needed for anxiety.   MELATONIN 3 MG TABS    Take 1 tablet by mouth at bedtime    MULTIPLE VITAMIN (MULTIVITAMIN WITH MINERALS) TABS    Take 1 tablet by mouth daily.   MULTIPLE VITAMINS-MINERALS (DECUBI-VITE) CAPS    Take 1 capsule by mouth daily.   OLOPATADINE (PATANOL) 0.1 % OPHTHALMIC SOLUTION    PLACE 1 DROP INTO BOTH EYES 2 (TWO) TIMES DAILY.   ONDANSETRON (ZOFRAN) 4 MG TABLET    Take 1 tablet (4 mg total) by mouth every 6 (six) hours as needed for nausea.   PROTEIN (PROCEL) POWD    Take 1 scoop by mouth 2 (two) times daily.   TROLAMINE SALICYLATE (ASPERCREME) 10 % CREAM    Apply 1 application topically as needed for muscle pain.  Modified Medications   No medications on file  Discontinued Medications   ACETAMINOPHEN (TYLENOL) 325 MG TABLET    Take 2 tablets (650 mg total) by mouth every 4 (four) hours as needed for mild pain (or temp >/= 99.5 F).   MIRABEGRON ER (MYRBETRIQ) 50 MG TB24 TABLET    Take 1 tablet (50 mg total) by mouth daily.   POLYETHYLENE GLYCOL (MIRALAX / GLYCOLAX) PACKET    Take 17 g by mouth 2 (two) times daily.   SENNOSIDES-DOCUSATE SODIUM (SENOKOT-S) 8.6-50 MG TABLET    Take 2 tablets by mouth 2 (two) times daily.     Physical Exam: Vitals:   12/24/15 1441  BP: 135/61  Pulse: 86  Resp: 20  Temp: 99.1 F (37.3 C)  TempSrc: Oral  SpO2: 98%  Weight: 139 lb (63 kg)  Height: 5\' 6"  (1.676 m)    Physical Exam  Constitutional: She appears well-developed.  Frail appearing, Sitting in w/c  Eyes: Pupils are equal, round, and reactive to light.  Neck: Neck supple. Carotid bruit is not present.  Cardiovascular: Normal rate and intact distal pulses.  An irregularly irregular rhythm present. Exam reveals  no gallop and no friction rub.   Murmur heard.  Systolic  murmur is present with a grade of 1/6  Pulmonary/Chest: Effort normal. No respiratory distress. She has no wheezes. She has no rales.  Diminished throughout  Abdominal: Soft. Bowel sounds are normal. She exhibits no distension. There is no hepatomegaly. There is no tenderness.  Musculoskeletal: She exhibits tenderness (to right wrist with redness and warmth noted).  Neurological: She is alert.  Skin: Skin is warm and dry.    Labs reviewed: Basic Metabolic Panel:  Recent Labs  08/08/15 0539  08/09/15 [id number]  09/12/15 1250  11/24/15 0416 11/25/15 0523 11/26/15 0420 12/04/15 12/23/15  NA 148*  < > 144  < > 142  < > 143 141 139 144 142  K 4.0  < > 4.1  < > 4.2  < > 3.8 4.3 4.4 4.4 4.3  CL 108  < > 109  < > 104  < > 111 110 108  --   --   CO2 29  < > 25  < > 31  < > 25 26 25   --   --   GLUCOSE 95  < > 102*  < > 89  < > 96 101* 114*  --   --   BUN 9  < > 10  < > 15  < > 15 10 15 20  24*  CREATININE 0.74  < > 0.76  < > 0.72  < > 0.82 0.60 0.67 0.5 0.5  CALCIUM 8.7*  < > 8.3*  < > 9.1  < > 8.3* 8.3* 8.5*  --   --   MG 2.1  --  2.0  --  1.9  --   --   --   --   --   --   < > = values in this interval not displayed. Liver Function Tests:  Recent Labs  08/08/15 0539 09/12/15 1250 11/23/15 2055  AST 23 21 24   ALT 12* 11* 13*  ALKPHOS 43 49 61  BILITOT 0.6 0.4 0.4  PROT 6.0* 6.4* 6.0*  ALBUMIN 3.3* 3.4* 3.3*   No results for input(s): LIPASE, AMYLASE in the last 8760 hours.  Recent Labs  06/23/15 1643 08/08/15 1303  AMMONIA 21 16   CBC:  Recent Labs  09/13/15 1618 11/23/15 2055  11/25/15 0523 11/26/15 0420 12/23/15  WBC 5.2 4.9  --  5.4 8.2 9.0  NEUTROABS 2.8 2.6  --   --   --  7  HGB 12.6 12.3  < > 11.9* 12.3 11.1*  HCT 37.9 38.9  < > 36.9 37.9 35*  MCV 94.3 96.5  --  93.7 94.0  --   PLT 177 183  --  186 185 227  < > = values in this interval not displayed. TSH:  Recent Labs  02/07/15 1112  08/07/15 0551  TSH 0.385* 0.301*   A1C: Lab Results  Component Value Date   HGBA1C 5.3 11/24/2015   Lipid Panel:  Recent Labs  08/07/15 0551 11/24/15 0417  CHOL 139 179  HDL 47 48  LDLCALC 82 116*  TRIG 50 75  CHOLHDL 3.0 3.7   Uric acid 4.3 on 9.25.17 Radiological Exams: 11-29-15 negative right wrist  Assessment/Plan 1. Cellulitis, unspecified cellulitis site, unspecified extremity site, unspecified laterality Has been treated with keflex in the past and improved however now wrist is red, tender and warm again. Will treat with doxycycline 100 mg BID PO for 7 days  2. Anemia, unspecified anemia type No signs of acute blood loss, will follow up CBC in 1  week to monitor   3. Acute bronchitis, unspecified organism Doxycycline will cover bronchitis. Will get chest xray due to hx of CVA with dysphagia to rule out pneumonia.  To use mucinex DM PO BID for 7 days for cough and congestion     Jessica K. Harle Battiest  [facility] [phone] 8 am - 5 pm) [phone] (after hours)

## 2015-12-30 ENCOUNTER — Non-Acute Institutional Stay (SKILLED_NURSING_FACILITY): Payer: Medicare Other | Admitting: Adult Health

## 2015-12-30 ENCOUNTER — Encounter: Payer: Self-pay | Admitting: Adult Health

## 2015-12-30 DIAGNOSIS — I69391 Dysphagia following cerebral infarction: Secondary | ICD-10-CM | POA: Diagnosis not present

## 2015-12-30 DIAGNOSIS — J209 Acute bronchitis, unspecified: Secondary | ICD-10-CM | POA: Diagnosis not present

## 2015-12-30 DIAGNOSIS — E441 Mild protein-calorie malnutrition: Secondary | ICD-10-CM

## 2015-12-30 DIAGNOSIS — K5901 Slow transit constipation: Secondary | ICD-10-CM | POA: Diagnosis not present

## 2015-12-30 DIAGNOSIS — I10 Essential (primary) hypertension: Secondary | ICD-10-CM

## 2015-12-30 DIAGNOSIS — H1013 Acute atopic conjunctivitis, bilateral: Secondary | ICD-10-CM

## 2015-12-30 DIAGNOSIS — L03113 Cellulitis of right upper limb: Secondary | ICD-10-CM | POA: Diagnosis not present

## 2015-12-30 DIAGNOSIS — I69319 Unspecified symptoms and signs involving cognitive functions following cerebral infarction: Secondary | ICD-10-CM | POA: Diagnosis not present

## 2015-12-30 DIAGNOSIS — G43009 Migraine without aura, not intractable, without status migrainosus: Secondary | ICD-10-CM

## 2015-12-30 DIAGNOSIS — I480 Paroxysmal atrial fibrillation: Secondary | ICD-10-CM | POA: Diagnosis not present

## 2015-12-30 DIAGNOSIS — I639 Cerebral infarction, unspecified: Secondary | ICD-10-CM | POA: Diagnosis not present

## 2015-12-30 DIAGNOSIS — R531 Weakness: Secondary | ICD-10-CM | POA: Diagnosis not present

## 2015-12-30 DIAGNOSIS — E559 Vitamin D deficiency, unspecified: Secondary | ICD-10-CM

## 2015-12-30 DIAGNOSIS — D649 Anemia, unspecified: Secondary | ICD-10-CM

## 2015-12-30 DIAGNOSIS — F4320 Adjustment disorder, unspecified: Secondary | ICD-10-CM

## 2015-12-30 NOTE — Progress Notes (Signed)
Patient ID: [name], female   DOB: Aug 19, 1924, 82 y.o.   MRN: [id number]    DATE:  12/30/2015   MRN:  [id number]  BIRTHDAY: [birth year]  Facility:  Nursing Home Location:  [place] Room Number: 203-B  LEVEL OF CARE:  SNF [phone])  Contact Information    Name Relation Home Work [place] Daughter [phone]  [phone]   Plummer,Kim Daughter [phone]   Goens,Kit Daughter [phone]  [phone]       Code Status History    Date Active Date Inactive Code Status Order ID Comments User Context   11/26/2015 12:26 AM 11/26/2015  9:47 PM DNR [id number]  Asencion Partridge, MD Inpatient   11/24/2015 12:38 AM 11/24/2015  1:59 PM DNR [id number]  Maryellen Pile, MD Inpatient   08/07/2015  4:52 AM 08/12/2015  8:32 PM Partial Code [id number]  Edwin Dada, MD Inpatient   12/18/2014  7:17 PM 12/21/2014  2:11 PM Partial Code [id number]  Willia Craze, NP Inpatient   10/31/2014  3:32 AM 10/31/2014  7:24 PM Full Code [id number]  Theressa Millard, MD Inpatient   08/28/2013 12:58 AM 08/30/2013  4:40 PM Full Code [id number]  Kelvin Cellar, MD Inpatient   03/09/2013  6:50 PM 03/12/2013  6:27 PM Full Code [id number]  Verlee Monte, MD Inpatient   02/15/2013  6:26 PM 03/03/2013 12:40 PM Full Code [id number]  Cathlyn Parsons, PA-C Inpatient   02/12/2013 12:50 PM 02/15/2013  6:26 PM Full Code [id number]  Velvet Bathe, MD Inpatient   02/05/2013  7:35 PM 02/07/2013  8:48 PM Full Code [id number]  Kelvin Cellar, MD Inpatient   08/01/2012  5:49 PM 08/03/2012  2:28 PM Full Code [id number]  Melton Alar, PA-C Inpatient    Questions for Most Recent Historical Code Status (Order [id number])    Question Answer Comment   In the event of cardiac or respiratory ARREST Do not call a "code blue"    In the event of cardiac or respiratory ARREST Do not perform Intubation, CPR, defibrillation or ACLS    In the event of cardiac or respiratory ARREST Use  medication by any route, position, wound care, and other measures to relive pain and suffering. May use oxygen, suction and manual treatment of airway obstruction as needed for comfort.         Advance Directive Documentation   Flowsheet Row Most Recent Value  Type of Advance Directive  Out of facility DNR (pink MOST or yellow form)  Pre-existing out of facility DNR order (yellow form or pink MOST form)  No data  "MOST" Form in Place?  No data       Chief Complaint  Patient presents with  . Medical Management of Chronic Issues    HISTORY OF PRESENT ILLNESS:  This is an 82 year old female who is being seen for a routine visit. She is currently having a short-term rehabilitation @ [facility]. She was recently treated for cellulitis of right wrist and acute bronchitis.   She has been admitted to [facility] on 11/26/15 from [facility]. She was having RUE pronator drift and proximal RLE weakness.  A CT on arrival was negative for acute infarct but MRI was positive for left medial frontal frontal ACA infarct. CVA was thought to be due to underlying Afib. She was not a candidate for TPA due to minor signs, age >67 and current use of oral anticoagulant. She has a history of CVAs  with residual left sided weakness and PAF on Eliquis 2.5 mg BID. Eliquis was increased from 2.5 mg to 5 mg BID, started on Amlodipine 2.5 mg daily.  PAST MEDICAL HISTORY:  Past Medical History:  Diagnosis Date  . Acute cystitis without hematuria   . Acute encephalopathy   . Arthritis   . Atrial fibrillation ([place])    on Eliquis Rx  . Atrial flutter ([place]) January, 2012  . Chronic anticoagulation   . Colon polyp   . Constipation   . Hearing difficulty   . Hemorrhoids   . High cholesterol   . HLD (hyperlipidemia)   . Hypertension   . Left leg weakness 02/05/2013  . Left-sided weakness   . Malnutrition ([place])   . Migraine   . Mild cognitive impairment with memory loss   . Osteoporosis   . Stroke  ([place]) 08/02/12    right lenticular nucleus infarct  . TIA (transient ischemic attack)   . Torus palatinus      CURRENT MEDICATIONS: Reviewed  Patient's Medications  New Prescriptions   No medications on file  Previous Medications   ACETAMINOPHEN (TYLENOL) 500 MG TABLET    Take 500 mg by mouth every 8 (eight) hours as needed.   AMLODIPINE (NORVASC) 2.5 MG TABLET    Take 1 tablet (2.5 mg total) by mouth daily.   APIXABAN (ELIQUIS) 5 MG TABS TABLET    Take 1 tablet (5 mg total) by mouth 2 (two) times daily.   ATORVASTATIN (LIPITOR) 40 MG TABLET    Take 1 tablet (40 mg total) by mouth daily at 6 PM.   B COMPLEX VITAMINS TABLET    Take 1 tablet by mouth 2 (two) times daily.    CALCIUM CARB-CHOLECALCIFEROL (CALCIUM 600 + D) 600-200 MG-UNIT TABS    Take 1 tablet by mouth 2 (two) times daily.   CETIRIZINE (ZYRTEC) 10 MG TABLET    Take 10 mg by mouth at bedtime.    CHOLECALCIFEROL (VITAMIN D3) 2000 UNITS TABS    Take 1 tablet by mouth daily.   CRANBERRY 250 MG CAPS    Take 2 capsules by mouth daily.    DEXTROMETHORPHAN-GUAIFENESIN (MUCINEX DM) 30-600 MG 12HR TABLET    Take 1 tablet by mouth 2 (two) times daily.   DIVALPROEX (DEPAKOTE) 125 MG DR TABLET    Take 2 tablets (250 mg total) by mouth 2 (two) times daily.   DOXYCYCLINE (VIBRA-TABS) 100 MG TABLET    Take 100 mg by mouth 2 (two) times daily.   HYDROCORTISONE (ANUSOL-HC) 2.5 % RECTAL CREAM    Place 1 application rectally as needed for hemorrhoids or itching.   HYDROCORTISONE (ANUSOL-HC) 25 MG SUPPOSITORY    Place 25 mg rectally as needed for hemorrhoids or itching.   LORAZEPAM (ATIVAN) 0.5 MG TABLET    Take 1 tablet (0.5 mg total) by mouth every 8 (eight) hours as needed for anxiety.   MELATONIN 3 MG TABS    Take 1 tablet by mouth at bedtime    MULTIPLE VITAMIN (MULTIVITAMIN WITH MINERALS) TABS    Take 1 tablet by mouth daily.   MULTIPLE VITAMINS-MINERALS (DECUBI-VITE) CAPS    Take 1 capsule by mouth daily.   OLOPATADINE (PATANOL) 0.1 %  OPHTHALMIC SOLUTION    PLACE 1 DROP INTO BOTH EYES 2 (TWO) TIMES DAILY.   ONDANSETRON (ZOFRAN) 4 MG TABLET    Take 1 tablet (4 mg total) by mouth every 6 (six) hours as needed for nausea.   PROTEIN (PROCEL) POWD  Take 1 scoop by mouth 2 (two) times daily.   SACCHAROMYCES BOULARDII (FLORASTOR) 250 MG CAPSULE    Take 250 mg by mouth 2 (two) times daily.   TROLAMINE SALICYLATE (ASPERCREME) 10 % CREAM    Apply 1 application topically as needed for muscle pain. Apply to right wrist  Modified Medications   No medications on file  Discontinued Medications   No medications on file     Allergies  Allergen Reactions  . Aricept [Donepezil Hcl] Hypertension    With N/V  . Tramadol Hcl Other (See Comments)     lethargy, nausea  . Alendronate Sodium Rash and Other (See Comments)    puffy eyes     REVIEW OF SYSTEMS:  Unable to obtain due to being mostly non-verbal    PHYSICAL EXAMINATION  GENERAL APPEARANCE: In no acute distress. Normal body habitus SKIN:  Left heel DTI, with dressing HEAD: Normal in size and contour. No evidence of trauma EYES: Lids open and close normally. No blepharitis, entropion or ectropion. PERRL. Conjunctivae are clear and sclerae are white. Lenses are without opacity EARS: Pinnae are normal. Patient hears normal voice tunes of the examiner MOUTH and THROAT: Lips are without lesions. Oral mucosa is moist and without lesions. Tongue is normal in shape, size, and color and without lesions NECK: supple, trachea midline, no neck masses, no thyroid tenderness, no thyromegaly LYMPHATICS: no LAN in the neck, no supraclavicular LAN RESPIRATORY: breathing is even & unlabored, BS CTAB CARDIAC: irregularly irregular, + murmur,no extra heart sounds, LE 1+ edema GI: abdomen soft, normal BS, no masses, no tenderness, no hepatomegaly, no splenomegaly EXTREMITIES:  Able to move X 4 extremities but has generalized weakness PSYCHIATRIC: Alert to self and disoriented to time and  place Affect and behavior are appropriate  LABS/RADIOLOGY: Labs reviewed: Basic Metabolic Panel:  Recent Labs  08/08/15 0539  08/09/15 0633  09/12/15 1250  11/24/15 0416 11/25/15 0523 11/26/15 0420 12/04/15 12/23/15  NA 148*  < > 144  < > 142  < > 143 141 139 144 142  K 4.0  < > 4.1  < > 4.2  < > 3.8 4.3 4.4 4.4 4.3  CL 108  < > 109  < > 104  < > 111 110 108  --   --   CO2 29  < > 25  < > 31  < > _0 --   --   GLUCOSE 95  < > 102*  < > 89  < > 96 101* 114*  --   --   BUN 9  < > 10  < > 15  < > _1 24*  CREATININE 0.74  < > 0.76  < > 0.72  < > 0.82 0.60 0.67 0.5 0.5  CALCIUM 8.7*  < > 8.3*  < > 9.1  < > 8.3* 8.3* 8.5*  --   --   MG 2.1  --  2.0  --  1.9  --   --   --   --   --   --   < > = values in this interval not displayed. Liver Function Tests:  Recent Labs  08/08/15 0539 09/12/15 1250 11/23/15 2055  AST _2 ALT 12* 11* 13*  ALKPHOS 43 49 61  BILITOT 0.6 0.4 0.4  PROT 6.0* 6.4* 6.0*  ALBUMIN 3.3* 3.4* 3.3*    Recent Labs  06/23/15 1643 08/08/15 1303  AMMONIA 21 16  CBC:  Recent Labs  09/13/15 1618 11/23/15 2055  11/25/15 0523 11/26/15 0420 12/23/15  WBC 5.2 4.9  --  5.4 8.2 9.0  NEUTROABS 2.8 2.6  --   --   --  7  HGB 12.6 12.3  < > 11.9* 12.3 11.1*  HCT 37.9 38.9  < > 36.9 37.9 35*  MCV 94.3 96.5  --  93.7 94.0  --   PLT 177 183  --  186 185 227  < > = values in this interval not displayed. Lipid Panel:  Recent Labs  08/07/15 0551 11/24/15 0417  HDL 47 48   Cardiac Enzymes:  Recent Labs  01/01/15 1311 07/31/15 1115 09/12/15 1250  TROPONINI <0.03 <0.03 <0.03   CBG:  Recent Labs  06/23/15 1715 08/07/15 0202 11/23/15 2111  GLUCAP 79 102* 154*      ASSESSMENT/PLAN:  Generalized weakness - continue rehabilitation, PT and OT, for therapeutic strengthening exercises; fall precaution  Acute CVA - continue rehabilitation, PT and OT, for therapeutic strengthening exercises; follow-up with neurology, Dr.  Leonie Man; continue Eliquis 5 mg BID and Lipitor 40 mg Q HS  Dysphagia - continue ST treatments for safe swallowing; aspiration precaution  PAF - rate-controlled; Eliquis was increased to 5 mg BID  Allergic rhinitis - continue Zyrtec 10 mg PO Q HS  Migraine - continue Depakote 125 mg DR give 2 tabs = 250 mg BID  Constipation - continue Senna-S 2 tabs PO Q HS and Miralax 17 gm Q D  Allergic Conjunctivitis - continue Patanol 0.1% 1 gtt to both eyes BID  Vitamin D deficiency - continue Vitamin D3 2,000 units 1 tab daily  Protein-calorie malnutrition albumin 3.3; continue Procel 1 scoop BID  Hypertension - recently started on Norvasc 2.5 mg PO daily  Anxiety - mood is stable; continue Ativan 0.5 mg 1 tab PO Q 8 Hours PRN  Right wrist cellulitis - recently finished Doxycycline 7 days course; resolved  Acute bronchitis - recently finished Mucinex X 1 week; no coughing noted; resolved  Anemia, unspecified -  stable Lab Results  Component Value Date   HGB 11.1 (A) 12/23/2015   Cognitive deficits - continue ST treatments to enhance communication skills    Goals of care:  Short-term rehabilitation    Durenda Age, NP [place] [phone]

## 2015-12-31 LAB — CBC AND DIFFERENTIAL
HCT: 36 % (ref 36–46)
Hemoglobin: 11.5 g/dL — AB (ref 12.0–16.0)
NEUTROS ABS: 5 /uL
Platelets: 240 10*3/uL (ref 150–399)
WBC: 7.6 10^3/mL

## 2016-01-22 ENCOUNTER — Non-Acute Institutional Stay (SKILLED_NURSING_FACILITY): Payer: Medicare Other | Admitting: Adult Health

## 2016-01-22 DIAGNOSIS — R05 Cough: Secondary | ICD-10-CM | POA: Diagnosis not present

## 2016-01-22 DIAGNOSIS — R509 Fever, unspecified: Secondary | ICD-10-CM | POA: Diagnosis not present

## 2016-01-22 DIAGNOSIS — R059 Cough, unspecified: Secondary | ICD-10-CM

## 2016-01-22 DIAGNOSIS — J189 Pneumonia, unspecified organism: Secondary | ICD-10-CM | POA: Diagnosis not present

## 2016-01-22 DIAGNOSIS — J309 Allergic rhinitis, unspecified: Secondary | ICD-10-CM | POA: Diagnosis not present

## 2016-01-22 NOTE — Progress Notes (Signed)
Patient ID: FLORENA KOZMA, female   DOB: Apr 05, 1924, 82 y.o.   MRN: [id number]    DATE:  01/22/2016   MRN:  [id number]  BIRTHDAY: [birth year]  Facility:  Nursing Home Location:  [place] Room Number: 203-B  LEVEL OF CARE:  SNF [phone])  Contact Information    Name Relation Home Work [place] Daughter [phone]  [phone]   Plummer,Kim Daughter [phone]   Gradel,Kit Daughter [phone]  [phone]       Code Status History    Date Active Date Inactive Code Status Order ID Comments User Context   11/26/2015 12:26 AM 11/26/2015  9:47 PM DNR [id number]  Asencion Partridge, MD Inpatient   11/24/2015 12:38 AM 11/24/2015  1:59 PM DNR [id number]  Maryellen Pile, MD Inpatient   08/07/2015  4:52 AM 08/12/2015  8:32 PM Partial Code [id number]  Edwin Dada, MD Inpatient   12/18/2014  7:17 PM 12/21/2014  2:11 PM Partial Code [id number]  Willia Craze, NP Inpatient   10/31/2014  3:32 AM 10/31/2014  7:24 PM Full Code [id number]  Theressa Millard, MD Inpatient   08/28/2013 12:58 AM 08/30/2013  4:40 PM Full Code [id number]  Kelvin Cellar, MD Inpatient   03/09/2013  6:50 PM 03/12/2013  6:27 PM Full Code [id number]  Verlee Monte, MD Inpatient   02/15/2013  6:26 PM 03/03/2013 12:40 PM Full Code [id number]  Cathlyn Parsons, PA-C Inpatient   02/12/2013 12:50 PM 02/15/2013  6:26 PM Full Code [id number]  Velvet Bathe, MD Inpatient   02/05/2013  7:35 PM 02/07/2013  8:48 PM Full Code [id number]  Kelvin Cellar, MD Inpatient   08/01/2012  5:49 PM 08/03/2012  2:28 PM Full Code [id number]  Melton Alar, PA-C Inpatient    Questions for Most Recent Historical Code Status (Order [id number])    Question Answer Comment   In the event of cardiac or respiratory ARREST Do not call a "code blue"    In the event of cardiac or respiratory ARREST Do not perform Intubation, CPR, defibrillation or ACLS    In the event of cardiac or respiratory ARREST Use  medication by any route, position, wound care, and other measures to relive pain and suffering. May use oxygen, suction and manual treatment of airway obstruction as needed for comfort.         Advance Directive Documentation   Flowsheet Row Most Recent Value  Type of Advance Directive  Out of facility DNR (pink MOST or yellow form)  Pre-existing out of facility DNR order (yellow form or pink MOST form)  No data  "MOST" Form in Place?  No data       Chief Complaint  Patient presents with  . Acute Visit    Cough, HCAP    HISTORY OF PRESENT ILLNESS:  This is an 82 year old female who had a fever 101.0 yesterday. She was seen in her room with 2 daughters @ bedside. She has been coughing productively according to daughters. Chest x-ray was ordered and showed bibasilar pneumonia.   PAST MEDICAL HISTORY:  Past Medical History:  Diagnosis Date  . Acute cystitis without hematuria   . Acute encephalopathy   . Arthritis   . Atrial fibrillation ([place])    on Eliquis Rx  . Atrial flutter ([place]) January, 2012  . Chronic anticoagulation   . Colon polyp   . Constipation   . Hearing difficulty   . Hemorrhoids   . High cholesterol   .  HLD (hyperlipidemia)   . Hypertension   . Left leg weakness 02/05/2013  . Left-sided weakness   . Malnutrition ([place])   . Migraine   . Mild cognitive impairment with memory loss   . Osteoporosis   . Stroke ([place]) 08/02/12    right lenticular nucleus infarct  . TIA (transient ischemic attack)   . Torus palatinus      CURRENT MEDICATIONS: Reviewed  Patient's Medications  New Prescriptions   No medications on file  Previous Medications   ACETAMINOPHEN (TYLENOL) 500 MG TABLET    Take 500 mg by mouth every 8 (eight) hours as needed.   AMLODIPINE (NORVASC) 2.5 MG TABLET    Take 1 tablet (2.5 mg total) by mouth daily.   APIXABAN (ELIQUIS) 5 MG TABS TABLET    Take 1 tablet (5 mg total) by mouth 2 (two) times daily.   ATORVASTATIN (LIPITOR) 40 MG TABLET     Take 1 tablet (40 mg total) by mouth daily at 6 PM.   B COMPLEX VITAMINS TABLET    Take 1 tablet by mouth 2 (two) times daily.    CALCIUM CARB-CHOLECALCIFEROL (CALCIUM 600 + D) 600-200 MG-UNIT TABS    Take 1 tablet by mouth 2 (two) times daily.   CETIRIZINE (ZYRTEC) 10 MG TABLET    Take 10 mg by mouth at bedtime.    CHOLECALCIFEROL (VITAMIN D3) 2000 UNITS TABS    Take 1 tablet by mouth daily.   CRANBERRY 250 MG CAPS    Take 2 capsules by mouth daily.    DIVALPROEX (DEPAKOTE) 125 MG DR TABLET    Take 2 tablets (250 mg total) by mouth 2 (two) times daily.   HYDROCORTISONE (ANUSOL-HC) 2.5 % RECTAL CREAM    Place 1 application rectally as needed for hemorrhoids or itching.   HYDROCORTISONE (ANUSOL-HC) 25 MG SUPPOSITORY    Place 25 mg rectally as needed for hemorrhoids or itching.   LORAZEPAM (ATIVAN) 0.5 MG TABLET    Take 1 tablet (0.5 mg total) by mouth every 8 (eight) hours as needed for anxiety.   MELATONIN 3 MG TABS    Take 1 tablet by mouth at bedtime    MULTIPLE VITAMIN (MULTIVITAMIN WITH MINERALS) TABS    Take 1 tablet by mouth daily.   MULTIPLE VITAMINS-MINERALS (DECUBI-VITE) CAPS    Take 1 capsule by mouth daily.   OLOPATADINE (PATANOL) 0.1 % OPHTHALMIC SOLUTION    PLACE 1 DROP INTO BOTH EYES 2 (TWO) TIMES DAILY.   ONDANSETRON (ZOFRAN) 4 MG TABLET    Take 1 tablet (4 mg total) by mouth every 6 (six) hours as needed for nausea.   PROTEIN (PROCEL) POWD    Take 1 scoop by mouth 2 (two) times daily.   TROLAMINE SALICYLATE (ASPERCREME) 10 % CREAM    Apply 1 application topically as needed for muscle pain. Apply to right wrist  Modified Medications   No medications on file  Discontinued Medications   No medications on file     Allergies  Allergen Reactions  . Aricept [Donepezil Hcl] Hypertension    With N/V  . Tramadol Hcl Other (See Comments)     lethargy, nausea  . Alendronate Sodium Rash and Other (See Comments)    puffy eyes     REVIEW OF SYSTEMS:  Unable to obtain due to being  mostly non-verbal    PHYSICAL EXAMINATION  GENERAL APPEARANCE: In no acute distress. Normal body habitus HEAD: Normal in size and contour. No evidence of trauma EYES: Lids  open and close normally. No blepharitis, entropion or ectropion. PERRL. Conjunctivae are clear and sclerae are white. Lenses are without opacity EARS: Pinnae are normal. Patient hears normal voice tunes of the examiner MOUTH and THROAT: Lips are without lesions. Oral mucosa is moist and without lesions. Tongue is normal in shape, size, and color and without lesions NECK: supple, trachea midline, no neck masses, no thyroid tenderness, no thyromegaly LYMPHATICS: no LAN in the neck, no supraclavicular LAN RESPIRATORY: breathing is even & unlabored, BS CTAB CARDIAC: irregularly irregular, + murmur,no extra heart sounds, LE 1+ edema GI: abdomen soft, normal BS, no masses, no tenderness, no hepatomegaly, no splenomegaly EXTREMITIES:  Able to move X 4 extremities but has generalized weakness PSYCHIATRIC: Alert to self and disoriented to time and place Affect and behavior are appropriate  LABS/RADIOLOGY: Labs reviewed: Basic Metabolic Panel:  Recent Labs  08/08/15 0539  08/09/15 0633  09/12/15 1250  11/24/15 0416 11/25/15 0523 11/26/15 0420 12/04/15 12/23/15  NA 148*  < > 144  < > 142  < > 143 141 139 144 142  K 4.0  < > 4.1  < > 4.2  < > 3.8 4.3 4.4 4.4 4.3  CL 108  < > 109  < > 104  < > 111 110 108  --   --   CO2 29  < > 25  < > 31  < > '25 26 25  '$ --   --   GLUCOSE 95  < > 102*  < > 89  < > 96 101* 114*  --   --   BUN 9  < > 10  < > 15  < > '15 10 15 20 '$ 24*  CREATININE 0.74  < > 0.76  < > 0.72  < > 0.82 0.60 0.67 0.5 0.5  CALCIUM 8.7*  < > 8.3*  < > 9.1  < > 8.3* 8.3* 8.5*  --   --   MG 2.1  --  2.0  --  1.9  --   --   --   --   --   --   < > = values in this interval not displayed. Liver Function Tests:  Recent Labs  08/08/15 0539 09/12/15 1250 11/23/15 2055  AST '23 21 24  '$ ALT 12* 11* 13*  ALKPHOS 43 49  61  BILITOT 0.6 0.4 0.4  PROT 6.0* 6.4* 6.0*  ALBUMIN 3.3* 3.4* 3.3*    Recent Labs  06/23/15 1643 08/08/15 1303  AMMONIA 21 16   CBC:  Recent Labs  09/13/15 1618 11/23/15 2055  11/25/15 0523 11/26/15 0420 12/23/15  WBC 5.2 4.9  --  5.4 8.2 9.0  NEUTROABS 2.8 2.6  --   --   --  7  HGB 12.6 12.3  < > 11.9* 12.3 11.1*  HCT 37.9 38.9  < > 36.9 37.9 35*  MCV 94.3 96.5  --  93.7 94.0  --   PLT 177 183  --  186 185 227  < > = values in this interval not displayed. Lipid Panel:  Recent Labs  08/07/15 0551 11/24/15 0417  HDL 47 48   Cardiac Enzymes:  Recent Labs  07/31/15 1115 09/12/15 1250  TROPONINI <0.03 <0.03   CBG:  Recent Labs  06/23/15 1715 08/07/15 0202 11/23/15 2111  GLUCAP 79 102* 154*      ASSESSMENT/PLAN:  HCAP - start Doxycycline 100 mg PO BID X 10 days and Florastor 250 mg 1 capsule PO BID X 13  days  Cough - start Robitussin 100 mg/5 ml give 10 ml = 200 mg PO Q 8 hours X 5 days  Allergic rhinitis - continue Zyrtec 10 mg 1 tab PO Q HS  Fever - continue Acetaminophen 500 mg 1 tab PO Q 8 hours PRN      Durenda Age, NP [employer] [phone]

## 2016-01-23 LAB — CBC AND DIFFERENTIAL
HCT: 33 % — AB (ref 36–46)
Hemoglobin: 10.6 g/dL — AB (ref 12.0–16.0)
Neutrophils Absolute: 4 /uL
Platelets: 209 10*3/uL (ref 150–399)
WBC: 5.9 10^3/mL

## 2016-01-23 LAB — BASIC METABOLIC PANEL
BUN: 14 mg/dL (ref 4–21)
Creatinine: 0.4 mg/dL — AB (ref 0.5–1.1)
Glucose: 94 mg/dL
Potassium: 4.2 mmol/L (ref 3.4–5.3)
Sodium: 140 mmol/L (ref 137–147)

## 2016-01-28 ENCOUNTER — Encounter: Payer: Self-pay | Admitting: Adult Health

## 2016-01-28 ENCOUNTER — Non-Acute Institutional Stay (SKILLED_NURSING_FACILITY): Payer: Medicare Other | Admitting: Adult Health

## 2016-01-28 DIAGNOSIS — I69319 Unspecified symptoms and signs involving cognitive functions following cerebral infarction: Secondary | ICD-10-CM | POA: Diagnosis not present

## 2016-01-28 DIAGNOSIS — I480 Paroxysmal atrial fibrillation: Secondary | ICD-10-CM

## 2016-01-28 DIAGNOSIS — K5901 Slow transit constipation: Secondary | ICD-10-CM

## 2016-01-28 DIAGNOSIS — J189 Pneumonia, unspecified organism: Secondary | ICD-10-CM | POA: Diagnosis not present

## 2016-01-28 DIAGNOSIS — I10 Essential (primary) hypertension: Secondary | ICD-10-CM

## 2016-01-28 DIAGNOSIS — I639 Cerebral infarction, unspecified: Secondary | ICD-10-CM

## 2016-01-28 DIAGNOSIS — R4189 Other symptoms and signs involving cognitive functions and awareness: Secondary | ICD-10-CM

## 2016-01-28 DIAGNOSIS — J309 Allergic rhinitis, unspecified: Secondary | ICD-10-CM

## 2016-01-28 DIAGNOSIS — F4329 Adjustment disorder with other symptoms: Secondary | ICD-10-CM

## 2016-01-28 DIAGNOSIS — E441 Mild protein-calorie malnutrition: Secondary | ICD-10-CM

## 2016-01-28 DIAGNOSIS — I69391 Dysphagia following cerebral infarction: Secondary | ICD-10-CM | POA: Diagnosis not present

## 2016-01-28 DIAGNOSIS — G43009 Migraine without aura, not intractable, without status migrainosus: Secondary | ICD-10-CM

## 2016-01-28 DIAGNOSIS — R531 Weakness: Secondary | ICD-10-CM | POA: Diagnosis not present

## 2016-01-28 NOTE — Progress Notes (Signed)
Patient ID: [name], female   DOB: Dec 14, 1924, 82 y.o.   MRN: [id number]    DATE:   01/28/16  MRN:  [id number]  BIRTHDAY: Sep 10, 1924  Facility:  Nursing Home Location:  [place] Room Number: 203-B  LEVEL OF CARE:  SNF [phone])  Contact Information    Name Relation Home Work [place] Daughter [phone]  [phone]   Plummer,Kim Daughter [phone]   Herrle,Kit Daughter [phone]  [phone]       Code Status History    Date Active Date Inactive Code Status Order ID Comments User Context   11/26/2015 12:26 AM 11/26/2015  9:47 PM DNR [id number]  Asencion Partridge, MD Inpatient   11/24/2015 12:38 AM 11/24/2015  1:59 PM DNR [id number]  Maryellen Pile, MD Inpatient   08/07/2015  4:52 AM 08/12/2015  8:32 PM Partial Code [id number]  Edwin Dada, MD Inpatient   12/18/2014  7:17 PM 12/21/2014  2:11 PM Partial Code [id number]  Willia Craze, NP Inpatient   10/31/2014  3:32 AM 10/31/2014  7:24 PM Full Code [id number]  Theressa Millard, MD Inpatient   08/28/2013 12:58 AM 08/30/2013  4:40 PM Full Code [id number]  Kelvin Cellar, MD Inpatient   03/09/2013  6:50 PM 03/12/2013  6:27 PM Full Code [id number]  Verlee Monte, MD Inpatient   02/15/2013  6:26 PM 03/03/2013 12:40 PM Full Code [id number]  Cathlyn Parsons, PA-C Inpatient   02/12/2013 12:50 PM 02/15/2013  6:26 PM Full Code [id number]  Velvet Bathe, MD Inpatient   02/05/2013  7:35 PM 02/07/2013  8:48 PM Full Code [id number]  Kelvin Cellar, MD Inpatient   08/01/2012  5:49 PM 08/03/2012  2:28 PM Full Code [id number]  Melton Alar, PA-C Inpatient    Questions for Most Recent Historical Code Status (Order [id number])    Question Answer Comment   In the event of cardiac or respiratory ARREST Do not call a "code blue"    In the event of cardiac or respiratory ARREST Do not perform Intubation, CPR, defibrillation or ACLS    In the event of cardiac or respiratory ARREST Use  medication by any route, position, wound care, and other measures to relive pain and suffering. May use oxygen, suction and manual treatment of airway obstruction as needed for comfort.         Advance Directive Documentation   Flowsheet Row Most Recent Value  Type of Advance Directive  Out of facility DNR (pink MOST or yellow form)  Pre-existing out of facility DNR order (yellow form or pink MOST form)  No data  "MOST" Form in Place?  No data       Chief Complaint  Patient presents with  . Medical Management of Chronic Issues    HISTORY OF PRESENT ILLNESS:  This is an 82 year old female who is being seen for a routine visit. She is currently having a short-term rehabilitation @ [facility]. She was recently started on Doxycycline for PNA. She is seen in the dining room and was verbally responsive, hard of hearing. No coughing has been noted.   She has been admitted to [facility] on 11/26/15 from [facility]. She was having RUE pronator drift and proximal RLE weakness.  A CT on arrival was negative for acute infarct but MRI was positive for left medial frontal frontal ACA infarct. CVA was thought to be due to underlying Afib. She was not a candidate for TPA due to minor  signs, age >68 and current use of oral anticoagulant. She has a history of CVAs with residual left sided weakness and PAF on Eliquis 2.5 mg BID. Eliquis was increased from 2.5 mg to 5 mg BID, started on Amlodipine 2.5 mg daily.  PAST MEDICAL HISTORY:  Past Medical History:  Diagnosis Date  . Acute cystitis without hematuria   . Acute encephalopathy   . Arthritis   . Atrial fibrillation ([place])    on Eliquis Rx  . Atrial flutter ([place]) January, 2012  . Chronic anticoagulation   . Colon polyp   . Constipation   . Hearing difficulty   . Hemorrhoids   . High cholesterol   . HLD (hyperlipidemia)   . Hypertension   . Left leg weakness 02/05/2013  . Left-sided weakness   . Malnutrition ([place])   . Migraine    . Mild cognitive impairment with memory loss   . Osteoporosis   . Stroke ([place]) 08/02/12    right lenticular nucleus infarct  . TIA (transient ischemic attack)   . Torus palatinus      CURRENT MEDICATIONS: Reviewed  Patient's Medications  New Prescriptions   No medications on file  Previous Medications   ACETAMINOPHEN (TYLENOL) 500 MG TABLET    Take 500 mg by mouth every 8 (eight) hours as needed.   AMLODIPINE (NORVASC) 2.5 MG TABLET    Take 1 tablet (2.5 mg total) by mouth daily.   APIXABAN (ELIQUIS) 5 MG TABS TABLET    Take 1 tablet (5 mg total) by mouth 2 (two) times daily.   ATORVASTATIN (LIPITOR) 40 MG TABLET    Take 1 tablet (40 mg total) by mouth daily at 6 PM.   B COMPLEX VITAMINS TABLET    Take 1 tablet by mouth 2 (two) times daily.    CALCIUM CARB-CHOLECALCIFEROL (CALCIUM 600 + D) 600-200 MG-UNIT TABS    Take 1 tablet by mouth 2 (two) times daily.   CETIRIZINE (ZYRTEC) 10 MG TABLET    Take 10 mg by mouth at bedtime.    CHOLECALCIFEROL (VITAMIN D3) 2000 UNITS TABS    Take 1 tablet by mouth daily.   CRANBERRY 250 MG CAPS    Take 2 capsules by mouth daily.    DIVALPROEX (DEPAKOTE) 125 MG DR TABLET    Take 2 tablets (250 mg total) by mouth 2 (two) times daily.   DOXYCYCLINE (VIBRAMYCIN) 100 MG CAPSULE    Take 100 mg by mouth daily.   HYDROCORTISONE (ANUSOL-HC) 2.5 % RECTAL CREAM    Place 1 application rectally as needed for hemorrhoids or itching.   HYDROCORTISONE (ANUSOL-HC) 25 MG SUPPOSITORY    Place 25 mg rectally as needed for hemorrhoids or itching.   LORAZEPAM (ATIVAN) 0.5 MG TABLET    Take 1 tablet (0.5 mg total) by mouth every 8 (eight) hours as needed for anxiety.   MELATONIN 3 MG TABS    Take 1 tablet by mouth at bedtime    MULTIPLE VITAMINS-MINERALS (DECUBI-VITE) CAPS    Take 1 capsule by mouth daily.   OLOPATADINE (PATANOL) 0.1 % OPHTHALMIC SOLUTION    PLACE 1 DROP INTO BOTH EYES 2 (TWO) TIMES DAILY.   ONDANSETRON (ZOFRAN) 4 MG TABLET    Take 1 tablet (4 mg total) by  mouth every 6 (six) hours as needed for nausea.   PROTEIN (PROCEL) POWD    Take 1 scoop by mouth 2 (two) times daily.   SACCHAROMYCES BOULARDII (FLORASTOR) 250 MG CAPSULE    Take 250 mg  by mouth 2 (two) times daily.   TROLAMINE SALICYLATE (ASPERCREME) 10 % CREAM    Apply 1 application topically 4 (four) times daily. Apply to right wrist QID for pain  Modified Medications   No medications on file  Discontinued Medications   MULTIPLE VITAMIN (MULTIVITAMIN WITH MINERALS) TABS    Take 1 tablet by mouth daily.     Allergies  Allergen Reactions  . Aricept [Donepezil Hcl] Hypertension    With N/V  . Tramadol Hcl Other (See Comments)     lethargy, nausea  . Alendronate Sodium Rash and Other (See Comments)    puffy eyes     REVIEW OF SYSTEMS:  Unable to obtain due to confusion and HOH    PHYSICAL EXAMINATION  GENERAL APPEARANCE: In no acute distress. Normal body habitus SKIN:  Left heel DTI, with dressing HEAD: Normal in size and contour. No evidence of trauma EYES: Lids open and close normally. No blepharitis, entropion or ectropion. PERRL. Conjunctivae are clear and sclerae are white. Lenses are without opacity EARS: Pinnae are normal. Patient hears normal voice tunes of the examiner MOUTH and THROAT: Lips are without lesions. Oral mucosa is moist and without lesions. Tongue is normal in shape, size, and color and without lesions NECK: supple, trachea midline, no neck masses, no thyroid tenderness, no thyromegaly LYMPHATICS: no LAN in the neck, no supraclavicular LAN RESPIRATORY: breathing is even & unlabored, BS CTAB CARDIAC: irregularly irregular, + murmur,no extra heart sounds, LE 1+ edema GI: abdomen soft, normal BS, no masses, no tenderness, no hepatomegaly, no splenomegaly EXTREMITIES:  Able to move X 4 extremities but has generalized weakness, left hand contracted with splint PSYCHIATRIC: Alert to self and disoriented to time and place Affect and behavior are  appropriate  LABS/RADIOLOGY: Labs reviewed: Basic Metabolic Panel:  Recent Labs  08/08/15 0539  08/09/15 0633  09/12/15 1250  11/24/15 0416 11/25/15 0523 11/26/15 0420 12/04/15 12/23/15 01/23/16  NA 148*  < > 144  < > 142  < > 143 141 139 144 142 140  K 4.0  < > 4.1  < > 4.2  < > 3.8 4.3 4.4 4.4 4.3 4.2  CL 108  < > 109  < > 104  < > 111 110 108  --   --   --   CO2 29  < > 25  < > 31  < > '25 26 25  '$ --   --   --   GLUCOSE 95  < > 102*  < > 89  < > 96 101* 114*  --   --   --   BUN 9  < > 10  < > 15  < > '15 10 15 20 '$ 24* 14  CREATININE 0.74  < > 0.76  < > 0.72  < > 0.82 0.60 0.67 0.5 0.5 0.4*  CALCIUM 8.7*  < > 8.3*  < > 9.1  < > 8.3* 8.3* 8.5*  --   --   --   MG 2.1  --  2.0  --  1.9  --   --   --   --   --   --   --   < > = values in this interval not displayed. Liver Function Tests:  Recent Labs  08/08/15 0539 09/12/15 1250 11/23/15 2055  AST '23 21 24  '$ ALT 12* 11* 13*  ALKPHOS 43 49 61  BILITOT 0.6 0.4 0.4  PROT 6.0* 6.4* 6.0*  ALBUMIN 3.3* 3.4* 3.3*  Recent Labs  06/23/15 1643 08/08/15 1303  AMMONIA 21 16   CBC:  Recent Labs  11/23/15 2055  11/25/15 0523 11/26/15 0420 12/23/15 12/31/15 01/23/16  WBC 4.9  --  5.4 8.2 9.0 7.6 5.9  NEUTROABS 2.6  --   --   --  '7 5 4  '$ HGB 12.3  < > 11.9* 12.3 11.1* 11.5* 10.6*  HCT 38.9  < > 36.9 37.9 35* 36 33*  MCV 96.5  --  93.7 94.0  --   --   --   PLT 183  --  186 185 227 240 209  < > = values in this interval not displayed. Lipid Panel:  Recent Labs  08/07/15 0551 11/24/15 0417  HDL 47 48   Cardiac Enzymes:  Recent Labs  07/31/15 1115 09/12/15 1250  TROPONINI <0.03 <0.03   CBG:  Recent Labs  06/23/15 1715 08/07/15 0202 11/23/15 2111  GLUCAP 79 102* 154*      ASSESSMENT/PLAN:  Generalized weakness - continue rehabilitation, PT and OT, for therapeutic strengthening exercises; fall precaution  HCAP - continue  Doxycycline 100 mg 1 PO BID till 02/02/16 and Florastor 250 mg 1 capsule  PO BID X  13 days  Acute CVA - continue rehabilitation, PT and OT, for therapeutic strengthening exercises; follow-up with neurology, Dr. Leonie Man; continue Eliquis 5 mg BID and Lipitor 40 mg Q HS  Dysphagia - continue ST treatments for safe swallowing; aspiration precaution  PAF - rate-controlled; Eliquis was increased to 5 mg BID  Allergic rhinitis - continue Zyrtec 10 mg PO Q HS  Migraine - continue Depakote 125 mg DR give 2 tabs = 250 mg BID  Constipation - continue Senna-S 2 tabs PO Q HS and Miralax 17 gm Q D  Allergic Conjunctivitis - continue Patanol 0.1% 1 gtt to both eyes BID  Vitamin D deficiency - continue Vitamin D3 2,000 units 1 tab daily  Protein-calorie malnutrition albumin 3.3; continue Procel 1 scoop BID  Hypertension - recently started on Norvasc 2.5 mg PO daily  Anxiety - mood is stable; continue Ativan 0.5 mg 1 tab PO Q 8 Hours PRN  Anemia, unspecified -  stable Lab Results  Component Value Date   HGB 10.6 (A) 01/23/2016   Cognitive deficits - continue ST treatments to enhance communication skills     Goals of care:  Short-term rehabilitation    Durenda Age, NP [facility] [phone]

## 2016-02-18 ENCOUNTER — Non-Acute Institutional Stay (SKILLED_NURSING_FACILITY): Payer: Medicare Other | Admitting: Adult Health

## 2016-02-18 ENCOUNTER — Ambulatory Visit: Payer: Medicare Other | Admitting: Neurology

## 2016-02-18 ENCOUNTER — Encounter: Payer: Self-pay | Admitting: Adult Health

## 2016-02-18 DIAGNOSIS — R05 Cough: Secondary | ICD-10-CM | POA: Diagnosis not present

## 2016-02-18 DIAGNOSIS — J189 Pneumonia, unspecified organism: Secondary | ICD-10-CM

## 2016-02-18 NOTE — Progress Notes (Signed)
Patient ID: ANAHI BELMAR, female   DOB: 03/17/1925, 82 y.o.   MRN: [id number]    DATE:   02/18/16  MRN:  [id number]  BIRTHDAY: Jul 28, 1924  Facility:  Nursing Home Location:  [place] Room Number: 203-B  LEVEL OF CARE:  SNF [phone])  Contact Information    Name Relation Home Work [place] Daughter [phone]  [phone]   Plummer,Kim Daughter [phone]   [place],Kit Daughter [phone]  [phone]       Code Status History    Date Active Date Inactive Code Status Order ID Comments User Context   11/26/2015 12:26 AM 11/26/2015  9:47 PM DNR [id number]  Asencion Partridge, MD Inpatient   11/24/2015 12:38 AM 11/24/2015  1:59 PM DNR [id number]  Maryellen Pile, MD Inpatient   08/07/2015  4:52 AM 08/12/2015  8:32 PM Partial Code [id number]  Edwin Dada, MD Inpatient   12/18/2014  7:17 PM 12/21/2014  2:11 PM Partial Code [id number]  Willia Craze, NP Inpatient   10/31/2014  3:32 AM 10/31/2014  7:24 PM Full Code [id number]  Theressa Millard, MD Inpatient   08/28/2013 12:58 AM 08/30/2013  4:40 PM Full Code [id number]  Kelvin Cellar, MD Inpatient   03/09/2013  6:50 PM 03/12/2013  6:27 PM Full Code [id number]  Verlee Monte, MD Inpatient   02/15/2013  6:26 PM 03/03/2013 12:40 PM Full Code [id number]  Cathlyn Parsons, PA-C Inpatient   02/12/2013 12:50 PM 02/15/2013  6:26 PM Full Code [id number]  Velvet Bathe, MD Inpatient   02/05/2013  7:35 PM 02/07/2013  8:48 PM Full Code [id number]  Kelvin Cellar, MD Inpatient   08/01/2012  5:49 PM 08/03/2012  2:28 PM Full Code [id number]  Melton Alar, PA-C Inpatient    Questions for Most Recent Historical Code Status (Order [id number])    Question Answer Comment   In the event of cardiac or respiratory ARREST Do not call a "code blue"    In the event of cardiac or respiratory ARREST Do not perform Intubation, CPR, defibrillation or ACLS    In the event of cardiac or respiratory ARREST Use  medication by any route, position, wound care, and other measures to relive pain and suffering. May use oxygen, suction and manual treatment of airway obstruction as needed for comfort.         Advance Directive Documentation   Flowsheet Row Most Recent Value  Type of Advance Directive  Out of facility DNR (pink MOST or yellow form)  Pre-existing out of facility DNR order (yellow form or pink MOST form)  No data  "MOST" Form in Place?  No data       Chief Complaint  Patient presents with  . Acute Visit    HCAP    HISTORY OF PRESENT ILLNESS:  This is an 82 year old female who is currently having a short-term rehabilitation @ [facility]. Patient was noted to be more confused by the family members. She was reported to be coughing. Chest x-ray revealed partial clearing of bibasilar infiltrates. She was recently discharged from speech therapy.   She has been admitted to [facility] on 11/26/15 from [facility]. She was having RUE pronator drift and proximal RLE weakness.  A CT on arrival was negative for acute infarct but MRI was positive for left medial frontal frontal ACA infarct. CVA was thought to be due to underlying Afib. She was not a candidate for TPA due to minor signs, age >  80 and current use of oral anticoagulant. She has a history of CVAs with residual left sided weakness and PAF on Eliquis 2.5 mg BID. Eliquis was increased from 2.5 mg to 5 mg BID, started on Amlodipine 2.5 mg daily.  PAST MEDICAL HISTORY:  Past Medical History:  Diagnosis Date  . Acute cystitis without hematuria   . Acute encephalopathy   . Arthritis   . Atrial fibrillation ([place])    on Eliquis Rx  . Atrial flutter ([place]) January, 2012  . Chronic anticoagulation   . Colon polyp   . Constipation   . Hearing difficulty   . Hemorrhoids   . High cholesterol   . HLD (hyperlipidemia)   . Hypertension   . Left leg weakness 02/05/2013  . Left-sided weakness   . Malnutrition ([place])   . Migraine   .  Mild cognitive impairment with memory loss   . Osteoporosis   . Stroke ([place]) 08/02/12    right lenticular nucleus infarct  . TIA (transient ischemic attack)   . Torus palatinus      CURRENT MEDICATIONS: Reviewed  Patient's Medications  New Prescriptions   No medications on file  Previous Medications   ACETAMINOPHEN (TYLENOL) 325 MG TABLET    Take 650 mg by mouth every 4 (four) hours as needed for fever (Temp >101.5).   ACETAMINOPHEN (TYLENOL) 500 MG TABLET    Take 500 mg by mouth every 8 (eight) hours as needed.   ALBUTEROL (PROVENTIL) (2.5 MG/3ML) 0.083% NEBULIZER SOLUTION    Take 2.5 mg by nebulization 3 (three) times daily as needed for wheezing or shortness of breath.   AMLODIPINE (NORVASC) 2.5 MG TABLET    Take 1 tablet (2.5 mg total) by mouth daily.   APIXABAN (ELIQUIS) 5 MG TABS TABLET    Take 1 tablet (5 mg total) by mouth 2 (two) times daily.   ATORVASTATIN (LIPITOR) 40 MG TABLET    Take 1 tablet (40 mg total) by mouth daily at 6 PM.   B COMPLEX VITAMINS TABLET    Take 1 tablet by mouth 2 (two) times daily.    CALCIUM CARB-CHOLECALCIFEROL (CALCIUM 600 + D) 600-200 MG-UNIT TABS    Take 1 tablet by mouth 2 (two) times daily.   CETIRIZINE (ZYRTEC) 10 MG TABLET    Take 10 mg by mouth at bedtime.    CHOLECALCIFEROL (VITAMIN D3) 2000 UNITS TABS    Take 1 tablet by mouth daily.   CRANBERRY 250 MG CAPS    Take 2 capsules by mouth daily. Take 2 capsules to = 500 mg po qd   DIVALPROEX (DEPAKOTE) 125 MG DR TABLET    Take 2 tablets (250 mg total) by mouth 2 (two) times daily.   HYDROCORTISONE (ANUSOL-HC) 2.5 % RECTAL CREAM    Place 1 application rectally as needed for hemorrhoids or itching.   HYDROCORTISONE (ANUSOL-HC) 25 MG SUPPOSITORY    Place 25 mg rectally as needed for hemorrhoids or itching.   LEVOFLOXACIN (LEVAQUIN) 500 MG TABLET    Take 500 mg by mouth daily. x10 days for HCAP   LORAZEPAM (ATIVAN) 0.5 MG TABLET    Take 1 tablet (0.5 mg total) by mouth every 8 (eight) hours as needed  for anxiety.   MELATONIN 3 MG TABS    Take 1 tablet by mouth at bedtime    MULTIPLE VITAMINS-MINERALS (MULTIVITAMIN WITH MINERALS) TABLET    Take 1 tablet by mouth daily.   OLOPATADINE (PATANOL) 0.1 % OPHTHALMIC SOLUTION    PLACE  1 DROP INTO BOTH EYES 2 (TWO) TIMES DAILY.   ONDANSETRON (ZOFRAN) 4 MG TABLET    Take 1 tablet (4 mg total) by mouth every 6 (six) hours as needed for nausea.   PROTEIN (PROCEL) POWD    Take 1 scoop by mouth 2 (two) times daily.   SACCHAROMYCES BOULARDII (FLORASTOR) 250 MG CAPSULE    Take 250 mg by mouth 2 (two) times daily. x13 days   TROLAMINE SALICYLATE (ASPERCREME) 10 % CREAM    Apply 1 application topically 4 (four) times daily. Apply to right wrist QID for pain  Modified Medications   No medications on file  Discontinued Medications   MULTIPLE VITAMINS-MINERALS (DECUBI-VITE) CAPS    Take 1 capsule by mouth daily.     Allergies  Allergen Reactions  . Aricept [Donepezil Hcl] Hypertension    With N/V  . Tramadol Hcl Other (See Comments)     lethargy, nausea  . Alendronate Sodium Rash and Other (See Comments)    puffy eyes     REVIEW OF SYSTEMS:  Unable to obtain due to confusion and HOH    PHYSICAL EXAMINATION  GENERAL APPEARANCE: In no acute distress. Normal body habitus SKIN:  Left heel ulcer has resolved HEAD: Normal in size and contour. No evidence of trauma EYES: Lids open and close normally. No blepharitis, entropion or ectropion. PERRL. Conjunctivae are clear and sclerae are white. Lenses are without opacity EARS: Pinnae are normal. Patient hears normal voice tunes of the examiner MOUTH and THROAT: Lips are without lesions. Oral mucosa is moist and without lesions. Tongue is normal in shape, size, and color and without lesions NECK: supple, trachea midline, no neck masses, no thyroid tenderness, no thyromegaly LYMPHATICS: no LAN in the neck, no supraclavicular LAN RESPIRATORY: breathing is even & unlabored, BS decreased bilaterally CARDIAC:  irregularly irregular, + murmur,no extra heart sounds, LE 1+ edema GI: abdomen soft, normal BS, no masses, no tenderness, no hepatomegaly, no splenomegaly EXTREMITIES:  Able to move X 4 extremities but has generalized weakness, left hand contracted with splint PSYCHIATRIC: Alert to self and disoriented to time and place Affect and behavior are appropriate  LABS/RADIOLOGY: Labs reviewed: Basic Metabolic Panel:  Recent Labs  08/08/15 0539  08/09/15 0633  09/12/15 1250  11/24/15 0416 11/25/15 0523 11/26/15 0420 12/04/15 12/23/15 01/23/16  NA 148*  < > 144  < > 142  < > 143 141 139 144 142 140  K 4.0  < > 4.1  < > 4.2  < > 3.8 4.3 4.4 4.4 4.3 4.2  CL 108  < > 109  < > 104  < > 111 110 108  --   --   --   CO2 29  < > 25  < > 31  < > _0 --   --   --   GLUCOSE 95  < > 102*  < > 89  < > 96 101* 114*  --   --   --   BUN 9  < > 10  < > 15  < > _1 24* 14  CREATININE 0.74  < > 0.76  < > 0.72  < > 0.82 0.60 0.67 0.5 0.5 0.4*  CALCIUM 8.7*  < > 8.3*  < > 9.1  < > 8.3* 8.3* 8.5*  --   --   --   MG 2.1  --  2.0  --  1.9  --   --   --   --   --   --   --   < > =  values in this interval not displayed. Liver Function Tests:  Recent Labs  08/08/15 0539 09/12/15 1250 11/23/15 2055  AST _0 ALT 12* 11* 13*  ALKPHOS 43 49 61  BILITOT 0.6 0.4 0.4  PROT 6.0* 6.4* 6.0*  ALBUMIN 3.3* 3.4* 3.3*    Recent Labs  06/23/15 1643 08/08/15 1303  AMMONIA 21 16   CBC:  Recent Labs  11/23/15 2055  11/25/15 0523 11/26/15 0420 12/23/15 12/31/15 01/23/16  WBC 4.9  --  5.4 8.2 9.0 7.6 5.9  NEUTROABS 2.6  --   --   --  _1 HGB 12.3  < > 11.9* 12.3 11.1* 11.5* 10.6*  HCT 38.9  < > 36.9 37.9 35* 36 33*  MCV 96.5  --  93.7 94.0  --   --   --   PLT 183  --  186 185 227 240 209  < > = values in this interval not displayed. Lipid Panel:  Recent Labs  08/07/15 0551 11/24/15 0417  HDL 47 48   Cardiac Enzymes:  Recent Labs  07/31/15 1115 09/12/15 1250  TROPONINI <0.03  <0.03   CBG:  Recent Labs  06/23/15 1715 08/07/15 0202 11/23/15 2111  GLUCAP 79 102* 154*      ASSESSMENT/PLAN:  HCAP - will start on Levaquin 500 mg 1 tab PO Q D X 10 days and Florastor 250 mg 1 capsule PO BID X 13 days     Durenda Age, NP [employer] [phone]

## 2016-03-04 ENCOUNTER — Non-Acute Institutional Stay (SKILLED_NURSING_FACILITY): Payer: Medicare Other | Admitting: Adult Health

## 2016-03-04 ENCOUNTER — Encounter: Payer: Self-pay | Admitting: Adult Health

## 2016-03-04 DIAGNOSIS — I10 Essential (primary) hypertension: Secondary | ICD-10-CM | POA: Diagnosis not present

## 2016-03-04 DIAGNOSIS — I639 Cerebral infarction, unspecified: Secondary | ICD-10-CM | POA: Diagnosis not present

## 2016-03-04 DIAGNOSIS — G40909 Epilepsy, unspecified, not intractable, without status epilepticus: Secondary | ICD-10-CM | POA: Diagnosis not present

## 2016-03-04 DIAGNOSIS — G8194 Hemiplegia, unspecified affecting left nondominant side: Secondary | ICD-10-CM | POA: Diagnosis not present

## 2016-03-04 NOTE — Progress Notes (Addendum)
DATE:  03/04/2016   MRN:  [id number]  BIRTHDAY: 31-Jul-1924  Facility:  Nursing Home Location:  [place] Room Number: 203-B  LEVEL OF CARE:  SNF [phone])  Contact Information    Name Relation Home Work [place] Daughter [phone]  [phone]   Plummer,Kim Daughter [phone]   Frees,Kit Daughter [phone]  [phone]       Code Status History    Date Active Date Inactive Code Status Order ID Comments User Context   11/26/2015 12:26 AM 11/26/2015  9:47 PM DNR [id number]  Asencion Partridge, MD Inpatient   11/24/2015 12:38 AM 11/24/2015  1:59 PM DNR [id number]  Maryellen Pile, MD Inpatient   08/07/2015  4:52 AM 08/12/2015  8:32 PM Partial Code [id number]  Edwin Dada, MD Inpatient   12/18/2014  7:17 PM 12/21/2014  2:11 PM Partial Code [id number]  Willia Craze, NP Inpatient   10/31/2014  3:32 AM 10/31/2014  7:24 PM Full Code [id number]  Theressa Millard, MD Inpatient   08/28/2013 12:58 AM 08/30/2013  4:40 PM Full Code [id number]  Kelvin Cellar, MD Inpatient   03/09/2013  6:50 PM 03/12/2013  6:27 PM Full Code [id number]  Verlee Monte, MD Inpatient   02/15/2013  6:26 PM 03/03/2013 12:40 PM Full Code [id number]  Cathlyn Parsons, PA-C Inpatient   02/12/2013 12:50 PM 02/15/2013  6:26 PM Full Code [id number]  Velvet Bathe, MD Inpatient   02/05/2013  7:35 PM 02/07/2013  8:48 PM Full Code [id number]  Kelvin Cellar, MD Inpatient   08/01/2012  5:49 PM 08/03/2012  2:28 PM Full Code [id number]  Melton Alar, PA-C Inpatient    Questions for Most Recent Historical Code Status (Order [id number])    Question Answer Comment   In the event of cardiac or respiratory ARREST Do not call a "code blue"    In the event of cardiac or respiratory ARREST Do not perform Intubation, CPR, defibrillation or ACLS    In the event of cardiac or respiratory ARREST Use medication by any route, position, wound care, and other measures to relive pain and  suffering. May use oxygen, suction and manual treatment of airway obstruction as needed for comfort.         Advance Directive Documentation   Flowsheet Row Most Recent Value  Type of Advance Directive  Out of facility DNR (pink MOST or yellow form)  Pre-existing out of facility DNR order (yellow form or pink MOST form)  No data  "MOST" Form in Place?  No data       Chief Complaint  Patient presents with  . Medical Management of Chronic Issues    HISTORY OF PRESENT ILLNESS:  The patient is an 20-YO female who is being seen for a routine visit. She was recently discharged fromST services. She is currently having regular consistency diet with thin liquids. She was recently treated with Levaquin for HCAP. Ativan was recently changed to every 12 hours when necessary 2 weeks. Her mood has been stable.   PAST MEDICAL HISTORY:  Past Medical History:  Diagnosis Date  . Acute cystitis without hematuria   . Acute encephalopathy   . Arthritis   . Atrial fibrillation ([place])    on Eliquis Rx  . Atrial flutter ([place]) January, 2012  . Chronic anticoagulation   . Colon polyp   . Constipation   . Hearing difficulty   . Hemorrhoids   . High cholesterol   . HLD (  hyperlipidemia)   . Hypertension   . Left leg weakness 02/05/2013  . Left-sided weakness   . Malnutrition ([place])   . Migraine   . Mild cognitive impairment with memory loss   . Osteoporosis   . Stroke ([place]) 08/02/12    right lenticular nucleus infarct  . TIA (transient ischemic attack)   . Torus palatinus      CURRENT MEDICATIONS: Reviewed  Patient's Medications  New Prescriptions   No medications on file  Previous Medications   ACETAMINOPHEN (TYLENOL) 325 MG TABLET    Take 650 mg by mouth every 4 (four) hours as needed for fever (Temp >101.5).   ACETAMINOPHEN (TYLENOL) 500 MG TABLET    Take 500 mg by mouth every 8 (eight) hours as needed.   ALBUTEROL (PROVENTIL) (2.5 MG/3ML) 0.083% NEBULIZER SOLUTION    Take 2.5 mg by  nebulization 3 (three) times daily as needed for wheezing or shortness of breath.   AMLODIPINE (NORVASC) 2.5 MG TABLET    Take 1 tablet (2.5 mg total) by mouth daily.   APIXABAN (ELIQUIS) 5 MG TABS TABLET    Take 1 tablet (5 mg total) by mouth 2 (two) times daily.   ATORVASTATIN (LIPITOR) 40 MG TABLET    Take 1 tablet (40 mg total) by mouth daily at 6 PM.   B COMPLEX VITAMINS TABLET    Take 1 tablet by mouth 2 (two) times daily.    CALCIUM CARB-CHOLECALCIFEROL (CALCIUM 600 + D) 600-200 MG-UNIT TABS    Take 1 tablet by mouth 2 (two) times daily.   CETIRIZINE (ZYRTEC) 10 MG TABLET    Take 10 mg by mouth at bedtime.    CHOLECALCIFEROL (VITAMIN D3) 2000 UNITS TABS    Take 1 tablet by mouth daily.   CRANBERRY 250 MG CAPS    Take 2 capsules by mouth daily. Take 2 capsules to = 500 mg po qd   DIVALPROEX (DEPAKOTE) 125 MG DR TABLET    Take 2 tablets (250 mg total) by mouth 2 (two) times daily.   HYDROCORTISONE (ANUSOL-HC) 2.5 % RECTAL CREAM    Place 1 application rectally as needed for hemorrhoids or itching.   HYDROCORTISONE (ANUSOL-HC) 25 MG SUPPOSITORY    Place 25 mg rectally as needed for hemorrhoids or itching.   LORAZEPAM (ATIVAN) 0.5 MG TABLET    Take 0.5 mg by mouth every 12 (twelve) hours as needed for anxiety.   MELATONIN 3 MG TABS    Take 1 tablet by mouth at bedtime    MULTIPLE VITAMINS-MINERALS (MULTIVITAMIN WITH MINERALS) TABLET    Take 1 tablet by mouth daily.   OLOPATADINE (PATANOL) 0.1 % OPHTHALMIC SOLUTION    PLACE 1 DROP INTO BOTH EYES 2 (TWO) TIMES DAILY.   ONDANSETRON (ZOFRAN) 4 MG TABLET    Take 1 tablet (4 mg total) by mouth every 6 (six) hours as needed for nausea.   PROTEIN (PROCEL) POWD    Take 1 scoop by mouth 2 (two) times daily.  Modified Medications   No medications on file  Discontinued Medications   No medications on file     Allergies  Allergen Reactions  . Aricept [Donepezil Hcl] Hypertension    With N/V  . Tramadol Hcl Other (See Comments)     lethargy, nausea   . Alendronate Sodium Rash and Other (See Comments)    puffy eyes     REVIEW OF SYSTEMS:  GENERAL: no change in appetite, no fatigue, no weight changes, no fever, chills or weakness EYES:  Denies change in vision, dry eyes, eye pain, itching or discharge EARS: Denies change in hearing, ringing in ears, or earache NOSE: Denies nasal congestion or epistaxis MOUTH and THROAT: Denies oral discomfort, gingival pain or bleeding, pain from teeth or hoarseness   RESPIRATORY: no cough, SOB, DOE, wheezing, hemoptysis CARDIAC: no chest pain, edema or palpitations GI: no abdominal pain, diarrhea, constipation, heart burn, nausea or vomiting GU: Denies dysuria, frequency, hematuria, incontinence, or discharge PSYCHIATRIC: Denies feeling of depression or anxiety. No report of hallucinations, insomnia, paranoia, or agitation     PHYSICAL EXAMINATION  GENERAL APPEARANCE: Well nourished. In no acute distress. Normal body habitus SKIN:  Skin is warm and dry.  HEAD: Normal in size and contour. No evidence of trauma EYES: Lids open and close normally. No blepharitis, entropion or ectropion. PERRL. Conjunctivae are clear and sclerae are white. Lenses are without opacity EARS: Pinnae are normal. Patient hears normal voice tunes of the examiner MOUTH and THROAT: Lips are without lesions. Oral mucosa is moist and without lesions. Tongue is normal in shape, size, and color and without lesions NECK: supple, trachea midline, no neck masses, no thyroid tenderness, no thyromegaly LYMPHATICS: no LAN in the neck, no supraclavicular LAN RESPIRATORY: breathing is even & unlabored, BS CTAB CARDIAC: RRR, no murmur,no extra heart sounds, no edema GI: abdomen soft, normal BS, no masses, no tenderness, no hepatomegaly, no splenomegaly EXTREMITIES:  Able to move 4 extremities , left hemiparesis, left hand has splint PSYCHIATRIC: Alert to self, disoriented to time and place. Affect and behavior are  appropriate    LABS/RADIOLOGY: Labs reviewed: Basic Metabolic Panel:  Recent Labs  08/08/15 0539  08/09/15 0633  09/12/15 1250  11/24/15 0416 11/25/15 0523 11/26/15 0420 12/04/15 12/23/15 01/23/16  NA 148*  < > 144  < > 142  < > 143 141 139 144 142 140  K 4.0  < > 4.1  < > 4.2  < > 3.8 4.3 4.4 4.4 4.3 4.2  CL 108  < > 109  < > 104  < > 111 110 108  --   --   --   CO2 29  < > 25  < > 31  < > _0 --   --   --   GLUCOSE 95  < > 102*  < > 89  < > 96 101* 114*  --   --   --   BUN 9  < > 10  < > 15  < > _1 24* 14  CREATININE 0.74  < > 0.76  < > 0.72  < > 0.82 0.60 0.67 0.5 0.5 0.4*  CALCIUM 8.7*  < > 8.3*  < > 9.1  < > 8.3* 8.3* 8.5*  --   --   --   MG 2.1  --  2.0  --  1.9  --   --   --   --   --   --   --   < > = values in this interval not displayed. Liver Function Tests:  Recent Labs  08/08/15 0539 09/12/15 1250 11/23/15 2055  AST _2 ALT 12* 11* 13*  ALKPHOS 43 49 61  BILITOT 0.6 0.4 0.4  PROT 6.0* 6.4* 6.0*  ALBUMIN 3.3* 3.4* 3.3*    Recent Labs  06/23/15 1643 08/08/15 1303  AMMONIA 21 16   CBC:  Recent Labs  11/23/15 2055  11/25/15 0523 11/26/15 0420 12/23/15 12/31/15 01/23/16  WBC 4.9  --  5.4 8.2 9.0 7.6 5.9  NEUTROABS 2.6  --   --   --  _0 HGB 12.3  < > 11.9* 12.3 11.1* 11.5* 10.6*  HCT 38.9  < > 36.9 37.9 35* 36 33*  MCV 96.5  --  93.7 94.0  --   --   --   PLT 183  --  186 185 227 240 209  < > = values in this interval not displayed.  Lipid Panel:  Recent Labs  08/07/15 0551 11/24/15 0417  HDL 47 48   Cardiac Enzymes:  Recent Labs  07/31/15 1115 09/12/15 1250  TROPONINI <0.03 <0.03   CBG:  Recent Labs  06/23/15 1715 08/07/15 0202 11/23/15 2111  GLUCAP 79 102* 154*     ASSESSMENT/PLAN:  Insomnia - continue melatonin 3 mg 1 tab by mouth daily at bedtime  CVA - continue Eliquis 5 mg 1 tab by mouth twice a day and Lipitor 40 mg 1 tab by mouth daily at bedtime; follow-up with neurology, Dr. Leonie Man    Seizure - continue Depakote DR 125 mg 2 capsules = 250 mg  by mouth twice a day   Hypertension - well controlled; continue Norvasc 2.5 mg 1 tab by mouth daily  Left hemiparesis - continue supportive care, splint on left hand     Goals of care:  Long-term care    Lauraann Missey C. Hughes - NP [employer] [phone]

## 2016-03-15 DIAGNOSIS — E784 Other hyperlipidemia: Secondary | ICD-10-CM | POA: Diagnosis not present

## 2016-03-15 DIAGNOSIS — Z79899 Other long term (current) drug therapy: Secondary | ICD-10-CM | POA: Diagnosis not present

## 2016-03-15 LAB — HEPATIC FUNCTION PANEL
ALT: 19 U/L (ref 7–35)
AST: 25 U/L (ref 13–35)
Alkaline Phosphatase: 112 U/L (ref 25–125)
Bilirubin, Total: 0.4 mg/dL

## 2016-03-15 LAB — LIPID PANEL
CHOLESTEROL: 122 mg/dL (ref 0–200)
HDL: 49 mg/dL (ref 35–70)
LDL Cholesterol: 58 mg/dL
TRIGLYCERIDES: 75 mg/dL (ref 40–160)

## 2016-03-19 DIAGNOSIS — D649 Anemia, unspecified: Secondary | ICD-10-CM | POA: Diagnosis not present

## 2016-03-19 DIAGNOSIS — R05 Cough: Secondary | ICD-10-CM | POA: Diagnosis not present

## 2016-03-19 DIAGNOSIS — I10 Essential (primary) hypertension: Secondary | ICD-10-CM | POA: Diagnosis not present

## 2016-03-19 DIAGNOSIS — R0989 Other specified symptoms and signs involving the circulatory and respiratory systems: Secondary | ICD-10-CM | POA: Diagnosis not present

## 2016-03-30 DIAGNOSIS — R278 Other lack of coordination: Secondary | ICD-10-CM | POA: Diagnosis not present

## 2016-03-30 DIAGNOSIS — M6281 Muscle weakness (generalized): Secondary | ICD-10-CM | POA: Diagnosis not present

## 2016-03-30 DIAGNOSIS — I69154 Hemiplegia and hemiparesis following nontraumatic intracerebral hemorrhage affecting left non-dominant side: Secondary | ICD-10-CM | POA: Diagnosis not present

## 2016-03-31 DIAGNOSIS — R278 Other lack of coordination: Secondary | ICD-10-CM | POA: Diagnosis not present

## 2016-03-31 DIAGNOSIS — I69154 Hemiplegia and hemiparesis following nontraumatic intracerebral hemorrhage affecting left non-dominant side: Secondary | ICD-10-CM | POA: Diagnosis not present

## 2016-03-31 DIAGNOSIS — M6281 Muscle weakness (generalized): Secondary | ICD-10-CM | POA: Diagnosis not present

## 2016-04-01 DIAGNOSIS — I69154 Hemiplegia and hemiparesis following nontraumatic intracerebral hemorrhage affecting left non-dominant side: Secondary | ICD-10-CM | POA: Diagnosis not present

## 2016-04-01 DIAGNOSIS — M6281 Muscle weakness (generalized): Secondary | ICD-10-CM | POA: Diagnosis not present

## 2016-04-01 DIAGNOSIS — R278 Other lack of coordination: Secondary | ICD-10-CM | POA: Diagnosis not present

## 2016-04-01 DIAGNOSIS — D509 Iron deficiency anemia, unspecified: Secondary | ICD-10-CM | POA: Diagnosis not present

## 2016-04-01 DIAGNOSIS — D649 Anemia, unspecified: Secondary | ICD-10-CM | POA: Diagnosis not present

## 2016-04-01 LAB — CBC AND DIFFERENTIAL
HEMATOCRIT: 37 % (ref 36–46)
Hemoglobin: 11.6 g/dL — AB (ref 12.0–16.0)
PLATELETS: 226 10*3/uL (ref 150–399)
WBC: 5.4 10*3/mL

## 2016-04-01 LAB — BASIC METABOLIC PANEL
BUN: 16 mg/dL (ref 4–21)
Creatinine: 0.6 mg/dL (ref 0.5–1.1)
GLUCOSE: 151 mg/dL
Potassium: 4.6 mmol/L (ref 3.4–5.3)
SODIUM: 142 mmol/L (ref 137–147)

## 2016-04-02 ENCOUNTER — Encounter: Payer: Self-pay | Admitting: Internal Medicine

## 2016-04-02 ENCOUNTER — Non-Acute Institutional Stay (SKILLED_NURSING_FACILITY): Payer: Medicare Other | Admitting: Internal Medicine

## 2016-04-02 DIAGNOSIS — Z8673 Personal history of transient ischemic attack (TIA), and cerebral infarction without residual deficits: Secondary | ICD-10-CM

## 2016-04-02 DIAGNOSIS — R05 Cough: Secondary | ICD-10-CM | POA: Diagnosis not present

## 2016-04-02 DIAGNOSIS — R42 Dizziness and giddiness: Secondary | ICD-10-CM

## 2016-04-02 DIAGNOSIS — Z79899 Other long term (current) drug therapy: Secondary | ICD-10-CM | POA: Diagnosis not present

## 2016-04-02 DIAGNOSIS — R278 Other lack of coordination: Secondary | ICD-10-CM | POA: Diagnosis not present

## 2016-04-02 DIAGNOSIS — M6281 Muscle weakness (generalized): Secondary | ICD-10-CM | POA: Diagnosis not present

## 2016-04-02 DIAGNOSIS — R29898 Other symptoms and signs involving the musculoskeletal system: Secondary | ICD-10-CM | POA: Diagnosis not present

## 2016-04-02 DIAGNOSIS — R55 Syncope and collapse: Secondary | ICD-10-CM | POA: Diagnosis not present

## 2016-04-02 DIAGNOSIS — D649 Anemia, unspecified: Secondary | ICD-10-CM | POA: Diagnosis not present

## 2016-04-02 DIAGNOSIS — I69154 Hemiplegia and hemiparesis following nontraumatic intracerebral hemorrhage affecting left non-dominant side: Secondary | ICD-10-CM | POA: Diagnosis not present

## 2016-04-02 DIAGNOSIS — N390 Urinary tract infection, site not specified: Secondary | ICD-10-CM

## 2016-04-02 DIAGNOSIS — R059 Cough, unspecified: Secondary | ICD-10-CM

## 2016-04-02 DIAGNOSIS — G40909 Epilepsy, unspecified, not intractable, without status epilepticus: Secondary | ICD-10-CM

## 2016-04-02 NOTE — Progress Notes (Signed)
LOCATION: [place]  PCP: Gildardo Cranker, DO   Code Status: DNR  Goals of care: Advanced Directive information Advanced Directives 03/04/2016  Does Patient Have a Medical Advance Directive? Yes  Type of Advance Directive Out of facility DNR (pink MOST or yellow form)  Does patient want to make changes to medical advance directive? No - Patient declined  Copy of [place] in Chart? No - copy requested  Would patient like information on creating a medical advance directive? -  Pre-existing out of facility DNR order (yellow form or pink MOST form) -       Extended Emergency Contact Information Primary Emergency Contact: [facility] Address: [address] [place], [place] [address] [place] Phone: [phone] Mobile Phone: [phone] Relation: Daughter Secondary Emergency Contact: Plummer,Kim Address: [place], [place] Phone: [phone] Work Phone: [phone] Mobile Phone: [phone] Relation: Daughter   Allergies  Allergen Reactions  . Aricept [Donepezil Hcl] Hypertension    With N/V  . Tramadol Hcl Other (See Comments)     lethargy, nausea  . Alendronate Sodium Rash and Other (See Comments)    puffy eyes    Chief Complaint  Patient presents with  . Medical Management of Chronic Issues    Routine Visit      HPI:  Patient is a 83 y.o. female seen today for Routine visit. She has started working with therapy team recently. Per nursing staff, while working with therapy team yesterday, she had a near-syncope episode. She was made to sit down and a set of vital 14 that was stable. She had blood work done to rule out acute abnormality and there were normal on review. She needs assistance with feeding. She is out of bed on daily basis. She takes her medications crushed and is on thin liquid. She is compliant with her medications. She participate some in  conversation and denies any concerns this visit. She has medical history of CVA, atrial fibrillation, osteoporosis, hyperlipidemia among others.  Review of Systems:  Constitutional: Negative for fever HENT: Negative for congestion.  Respiratory: Negative for shortness of breath. Positive for cough with congestion for a few days per nursing. Cardiovascular: Negative for chest pain Gastrointestinal: Negative for heartburn, nausea, vomiting, abdominal pain Genitourinary: Has urinary incontinence  Musculoskeletal: Negative for back pain, fall Skin: Negative for itching and rash.  Neurological: Needs 2 person assist with transfer and is out of bed daily.    Past Medical History:  Diagnosis Date  . Acute cystitis without hematuria   . Acute encephalopathy   . Arthritis   . Atrial fibrillation ([place])    on Eliquis Rx  . Atrial flutter ([place]) January, 2012  . Chronic anticoagulation   . Colon polyp   . Constipation   . Hearing difficulty   . Hemorrhoids   . High cholesterol   . HLD (hyperlipidemia)   . Hypertension   . Left leg weakness 02/05/2013  . Left-sided weakness   . Malnutrition ([place])   . Migraine   . Mild cognitive impairment with memory loss   . Osteoporosis   . Stroke ([place]) 08/02/12    right lenticular nucleus infarct  . TIA (transient ischemic attack)   . Torus palatinus    Past Surgical History:  Procedure Laterality Date  . APPENDECTOMY  02/19/53   `  .  CARDIOVERSION  05/19/2010   Dr. Einar Gip  . CATARACT EXTRACTION W/ INTRAOCULAR LENS  IMPLANT, BILATERAL  2006-2008  . GANGLION CYST EXCISION Bilateral 1938,1954,2003,2005   "wrists/hand" (08/01/2012)  . MOUTH SURGERY     [name]  . TONSILLECTOMY  ~ 1935   Social History:   reports that she has quit smoking. She has never used smokeless tobacco. She reports that she does not drink alcohol or use drugs.  Family History  Problem Relation Age of Onset  . Cancer Mother     breast  . Aneurysm Mother     brain  .  Stroke Mother   . Colon cancer Father   . Breast cancer    . Brain cancer    . Heart attack Neg Hx   . Diabetes Neg Hx   . Hypertension Neg Hx     Medications: Allergies as of 04/02/2016      Reactions   Aricept [donepezil Hcl] Hypertension   With N/V   Tramadol Hcl Other (See Comments)    lethargy, nausea   Alendronate Sodium Rash, Other (See Comments)   puffy eyes      Medication List       Accurate as of 04/02/16  2:24 PM. Always use your most recent med list.          acetaminophen 500 MG tablet Commonly known as:  TYLENOL Take 500 mg by mouth every 8 (eight) hours as needed.   acetaminophen 325 MG tablet Commonly known as:  TYLENOL Take 650 mg by mouth every 4 (four) hours as needed for fever (Temp >101.5).   albuterol (2.5 MG/3ML) 0.083% nebulizer solution Commonly known as:  PROVENTIL Take 2.5 mg by nebulization 3 (three) times daily as needed for wheezing or shortness of breath.   amLODipine 2.5 MG tablet Commonly known as:  NORVASC Take 1 tablet (2.5 mg total) by mouth daily.   apixaban 5 MG Tabs tablet Commonly known as:  ELIQUIS Take 1 tablet (5 mg total) by mouth 2 (two) times daily.   atorvastatin 40 MG tablet Commonly known as:  LIPITOR Take 1 tablet (40 mg total) by mouth daily at 6 PM.   b complex vitamins tablet Take 1 tablet by mouth 2 (two) times daily.   CALCIUM 600 + D 600-200 MG-UNIT Tabs Generic drug:  Calcium Carb-Cholecalciferol Take 1 tablet by mouth 2 (two) times daily.   cetirizine 10 MG tablet Commonly known as:  ZYRTEC Take 10 mg by mouth at bedtime.   Cranberry 250 MG Caps Take 2 capsules by mouth daily. Take 2 capsules to = 500 mg po qd   divalproex 125 MG DR tablet Commonly known as:  DEPAKOTE Take 2 tablets (250 mg total) by mouth 2 (two) times daily.   hydrocortisone 2.5 % rectal cream Commonly known as:  ANUSOL-HC Place 1 application rectally as needed for hemorrhoids or itching.   hydrocortisone 25 MG  suppository Commonly known as:  ANUSOL-HC Place 25 mg rectally as needed for hemorrhoids or itching.   Melatonin 3 MG Tabs Take 1 tablet by mouth at bedtime   multivitamin with minerals tablet Take 1 tablet by mouth daily.   olopatadine 0.1 % ophthalmic solution Commonly known as:  PATANOL PLACE 1 DROP INTO BOTH EYES 2 (TWO) TIMES DAILY.   ondansetron 4 MG tablet Commonly known as:  ZOFRAN Take 1 tablet (4 mg total) by mouth every 6 (six) hours as needed for nausea.   PROCEL Powd Take 1 scoop by mouth 2 (  two) times daily.   Vitamin D3 2000 units Tabs Take 1 tablet by mouth daily.       Immunizations: Immunization History  Administered Date(s) Administered  . Influenza Split 01/18/2011  . Influenza Whole 02/10/2009, 12/09/2009  . Influenza, High Dose Seasonal PF 01/26/2013  . Influenza,inj,Quad PF,36+ Mos 11/27/2013, 12/11/2014  . PPD Test 09/20/2014, 08/13/2015, 08/26/2015  . Pneumococcal Conjugate-13 10/19/2013  . Pneumococcal Polysaccharide-23 05/06/2009  . Td 02/05/2008  . Zoster 01/05/2014     Physical Exam: Vitals:   04/02/16 1418  BP: (!) 167/72  Pulse: (!) 58  Resp: 18  SpO2: 94%  Weight: 136 lb 9.6 oz (62 kg)  Height: 5\' 6"  (1.676 m)   Body mass index is 22.05 kg/m.  General- elderly female, frail and thin built, in no acute distress Head- normocephalic, atraumatic Nose- no nasal discharge Throat- moist mucus membrane Eyes- PERRLA, EOMI, no pallor, no icterus, no discharge Neck- no cervical lymphadenopathy Cardiovascular- normal s1,s2, no murmur, 1+ leg edema Respiratory- bilateral clear to auscultation, no wheeze, no rhonchi, no crackles, no use of accessory muscles Abdomen- bowel sounds present, soft, non tender Musculoskeletal- generalized weakness present more prominent in lower extremities, Contracture to left hand with splint in place, no leg edema, heel floaters present Neurological- alert and oriented to person only Skin- warm and  dry    Labs reviewed: Basic Metabolic Panel:  Recent Labs  08/08/15 0539  08/09/15 0633  09/12/15 1250  11/24/15 0416 11/25/15 0523 11/26/15 0420  12/23/15 01/23/16 04/01/16  NA 148*  < > 144  < > 142  < > 143 141 139  < > 142 140 142  K 4.0  < > 4.1  < > 4.2  < > 3.8 4.3 4.4  < > 4.3 4.2 4.6  CL 108  < > 109  < > 104  < > 111 110 108  --   --   --   --   CO2 29  < > 25  < > 31  < > 25 26 25   --   --   --   --   GLUCOSE 95  < > 102*  < > 89  < > 96 101* 114*  --   --   --   --   BUN 9  < > 10  < > 15  < > 15 10 15   < > 24* 14 16  CREATININE 0.74  < > 0.76  < > 0.72  < > 0.82 0.60 0.67  < > 0.5 0.4* 0.6  CALCIUM 8.7*  < > 8.3*  < > 9.1  < > 8.3* 8.3* 8.5*  --   --   --   --   MG 2.1  --  2.0  --  1.9  --   --   --   --   --   --   --   --   < > = values in this interval not displayed. Liver Function Tests:  Recent Labs  08/08/15 0539 09/12/15 1250 11/23/15 2055 03/15/16  AST 23 21 24 25   ALT 12* 11* 13* 19  ALKPHOS 43 49 61 112  BILITOT 0.6 0.4 0.4  --   PROT 6.0* 6.4* 6.0*  --   ALBUMIN 3.3* 3.4* 3.3*  --    No results for input(s): LIPASE, AMYLASE in the last 8760 hours.  Recent Labs  06/23/15 1643 08/08/15 1303  AMMONIA 21 16   CBC:  Recent Labs  11/23/15 2055  11/25/15 0523 11/26/15 0420 12/23/15 12/31/15 01/23/16 04/01/16  WBC 4.9  --  5.4 8.2 9.0 7.6 5.9 5.4  NEUTROABS 2.6  --   --   --  7 5 4   --   HGB 12.3  < > 11.9* 12.3 11.1* 11.5* 10.6* 11.6*  HCT 38.9  < > 36.9 37.9 35* 36 33* 37  MCV 96.5  --  93.7 94.0  --   --   --   --   PLT 183  --  186 185 227 240 209 226  < > = values in this interval not displayed. Cardiac Enzymes:  Recent Labs  07/31/15 1115 09/12/15 1250  TROPONINI <0.03 <0.03   BNP: Invalid input(s): POCBNP CBG:  Recent Labs  06/23/15 1715 08/07/15 0202 11/23/15 2111  GLUCAP 79 102* 154*      Assessment/Plan  Lower extremity weakness With her history of CVA and deconditioning. Patient to work with physical  therapy and occupational therapy to help regain and restore her strength. Fall precautions to be taken.  Presyncope Had a presyncopal episode yesterday while working with therapy. Likely vasovagal given her resuming of therapy. Reviewed her vital signs showing bradycardia. Rest of her vitals are stable. Labs on review of stable. Monitor clinically for now. Check blood pressure/heart rate twice a day for 2 weeks. Will have her work with physical therapy and occupational therapy team.  Cough Per nursing staff, has increased cough mainly with meals. With concerns for aspiration given her coughing with meals and history of CVA, get speech therapy to evaluate. To provide complete assistance with meals for now. Start Robitussin 10 mL twice a day for 5 days to help with her cough and monitor. Aspiration precautions to be taken for now.  Seizure disorder Remains seizure-free. Continue Depakote  Recurrent UTI  currently asymptomatic. Continue cranberry supplement and monitor. Hydration and perineal hygiene to be maintained.   History of CVA Continue eliquis 5 mg bid and lipitor 40 mg daily.   afib Rate controlled. Continue eliquis 5 mg bid for anticoagulation   Family/ staff Communication: reviewed care plan with patient's nursing supervisor    Blanchie Serve, MD Internal Medicine [place], [place] [address] Cell Phone (Monday-Friday 8 am - 5 pm): [phone] On Call: [phone] and follow prompts after 5 pm and on weekends Office Phone: [phone] Office Fax: [phone]

## 2016-04-05 DIAGNOSIS — R278 Other lack of coordination: Secondary | ICD-10-CM | POA: Diagnosis not present

## 2016-04-05 DIAGNOSIS — M6281 Muscle weakness (generalized): Secondary | ICD-10-CM | POA: Diagnosis not present

## 2016-04-05 DIAGNOSIS — I69154 Hemiplegia and hemiparesis following nontraumatic intracerebral hemorrhage affecting left non-dominant side: Secondary | ICD-10-CM | POA: Diagnosis not present

## 2016-04-06 DIAGNOSIS — Z8673 Personal history of transient ischemic attack (TIA), and cerebral infarction without residual deficits: Secondary | ICD-10-CM | POA: Diagnosis not present

## 2016-04-06 DIAGNOSIS — M6281 Muscle weakness (generalized): Secondary | ICD-10-CM | POA: Diagnosis not present

## 2016-04-06 DIAGNOSIS — N390 Urinary tract infection, site not specified: Secondary | ICD-10-CM | POA: Diagnosis not present

## 2016-04-06 DIAGNOSIS — Z79899 Other long term (current) drug therapy: Secondary | ICD-10-CM | POA: Diagnosis not present

## 2016-04-06 DIAGNOSIS — D649 Anemia, unspecified: Secondary | ICD-10-CM | POA: Diagnosis not present

## 2016-04-06 DIAGNOSIS — D509 Iron deficiency anemia, unspecified: Secondary | ICD-10-CM | POA: Diagnosis not present

## 2016-04-06 DIAGNOSIS — R278 Other lack of coordination: Secondary | ICD-10-CM | POA: Diagnosis not present

## 2016-04-06 DIAGNOSIS — I69154 Hemiplegia and hemiparesis following nontraumatic intracerebral hemorrhage affecting left non-dominant side: Secondary | ICD-10-CM | POA: Diagnosis not present

## 2016-04-06 LAB — BASIC METABOLIC PANEL
BUN: 16 mg/dL (ref 4–21)
CREATININE: 0.4 mg/dL — AB (ref 0.5–1.1)
Glucose: 85 mg/dL
Sodium: 141 mmol/L (ref 137–147)

## 2016-04-06 LAB — CBC AND DIFFERENTIAL
HEMATOCRIT: 36 % (ref 36–46)
Hemoglobin: 11.8 g/dL — AB (ref 12.0–16.0)
PLATELETS: 297 10*3/uL (ref 150–399)
WBC: 5 10^3/mL

## 2016-04-07 DIAGNOSIS — R278 Other lack of coordination: Secondary | ICD-10-CM | POA: Diagnosis not present

## 2016-04-07 DIAGNOSIS — M79675 Pain in left toe(s): Secondary | ICD-10-CM | POA: Diagnosis not present

## 2016-04-07 DIAGNOSIS — M79674 Pain in right toe(s): Secondary | ICD-10-CM | POA: Diagnosis not present

## 2016-04-07 DIAGNOSIS — M6281 Muscle weakness (generalized): Secondary | ICD-10-CM | POA: Diagnosis not present

## 2016-04-07 DIAGNOSIS — B351 Tinea unguium: Secondary | ICD-10-CM | POA: Diagnosis not present

## 2016-04-07 DIAGNOSIS — I69154 Hemiplegia and hemiparesis following nontraumatic intracerebral hemorrhage affecting left non-dominant side: Secondary | ICD-10-CM | POA: Diagnosis not present

## 2016-04-07 DIAGNOSIS — I70203 Unspecified atherosclerosis of native arteries of extremities, bilateral legs: Secondary | ICD-10-CM | POA: Diagnosis not present

## 2016-04-08 DIAGNOSIS — R278 Other lack of coordination: Secondary | ICD-10-CM | POA: Diagnosis not present

## 2016-04-08 DIAGNOSIS — I69154 Hemiplegia and hemiparesis following nontraumatic intracerebral hemorrhage affecting left non-dominant side: Secondary | ICD-10-CM | POA: Diagnosis not present

## 2016-04-08 DIAGNOSIS — M6281 Muscle weakness (generalized): Secondary | ICD-10-CM | POA: Diagnosis not present

## 2016-04-09 DIAGNOSIS — D649 Anemia, unspecified: Secondary | ICD-10-CM | POA: Diagnosis not present

## 2016-04-09 DIAGNOSIS — M6281 Muscle weakness (generalized): Secondary | ICD-10-CM | POA: Diagnosis not present

## 2016-04-09 DIAGNOSIS — D509 Iron deficiency anemia, unspecified: Secondary | ICD-10-CM | POA: Diagnosis not present

## 2016-04-09 DIAGNOSIS — I69154 Hemiplegia and hemiparesis following nontraumatic intracerebral hemorrhage affecting left non-dominant side: Secondary | ICD-10-CM | POA: Diagnosis not present

## 2016-04-09 DIAGNOSIS — Z8673 Personal history of transient ischemic attack (TIA), and cerebral infarction without residual deficits: Secondary | ICD-10-CM | POA: Diagnosis not present

## 2016-04-09 DIAGNOSIS — R278 Other lack of coordination: Secondary | ICD-10-CM | POA: Diagnosis not present

## 2016-04-09 DIAGNOSIS — N390 Urinary tract infection, site not specified: Secondary | ICD-10-CM | POA: Diagnosis not present

## 2016-04-12 ENCOUNTER — Ambulatory Visit (INDEPENDENT_AMBULATORY_CARE_PROVIDER_SITE_OTHER): Payer: Medicare Other | Admitting: Neurology

## 2016-04-12 ENCOUNTER — Encounter: Payer: Self-pay | Admitting: Neurology

## 2016-04-12 VITALS — BP 112/62 | HR 80 | Ht 66.0 in

## 2016-04-12 DIAGNOSIS — I63421 Cerebral infarction due to embolism of right anterior cerebral artery: Secondary | ICD-10-CM

## 2016-04-12 DIAGNOSIS — M6281 Muscle weakness (generalized): Secondary | ICD-10-CM | POA: Diagnosis not present

## 2016-04-12 DIAGNOSIS — R278 Other lack of coordination: Secondary | ICD-10-CM | POA: Diagnosis not present

## 2016-04-12 DIAGNOSIS — I69154 Hemiplegia and hemiparesis following nontraumatic intracerebral hemorrhage affecting left non-dominant side: Secondary | ICD-10-CM | POA: Diagnosis not present

## 2016-04-12 MED ORDER — DIVALPROEX SODIUM 125 MG PO DR TAB: 375.0000 mg | DELAYED_RELEASE_TABLET | Freq: Two times a day (BID) | ORAL | 1 refills | Status: DC

## 2016-04-12 NOTE — Progress Notes (Signed)
[facility] [address] [place]. [place] [address] [phone]       OFFICE FOLLOW-UP NOTE  [name] Date of Birth:  January 12, 1925 Medical Record Number:  [id number]   HPI: 47 year Caucasian lady seen for first office follow for the following hospital admission on 03/09/13 for left hemiparesis. She has remote history of right brain subcortical infarct in May 2014 felt to be secondary to atrial fibrillation has been on anticoagulation with cerebral since then. She was admitted multiple times with worsening of hemiparesis in the setting of infection or dehydration. She had an MRI in December 2014 which did not reveal any new right brain stroke but showed and he of restricted diffusion which is patchy in the left parietal region. At that time it was not clear whether this was a stroke or not however the patient's daughter was informed today that she had indeed had a fall a month prior in November 2014 that she had sustained scalp hematoma in the left temporal region and had been seen in the ER and a solitary CT scan of the head was unremarkable and she was sent back to the nursing home. In retrospect the MRI diffusion abnormality in the left parietal region in December 2014 they represent a delayed hemorrhage contusion which may have sustained from a fall in November 2014. It was planned to change as there are 2 to a liquids but the family never made the switch as they were unclear as to what the co-pay for a liquids would cost him. The patient seems to be tolerating that well without any further increased bleeding or bruising. She suffered from a bad cold for 2 weeks and had a minor nasal bleed which stopped. She is currently living at home and getting physical and occupational therapy. She is able to walk with a walker fairly well. She is carefully washed over by 3 daughters. She has had some mild memory difficulties but these are not progressive. Update 11/15/2013 : She returns  for followup of her last visit in March 2015. She did benefit from outpatient physical and occupational therapy but was readmitted in May and was found to have a right corpus callosum infarct as well as bladder infection. I have personally reviewed imaging studies and hospital workup. She was on Xarelto which was switched to eliquis 2.5mg  twice daily. She had a prolonged stay in rehabilitation at [facility] and has just finished home physical and occupational therapy recently. She can walk with a walker but does lean forward sent to the right and requires one-person assist. She still has some residual left-sided weakness from her recent stroke. She is impulsive and does not always call for help. The family feels she is unsafe to walk by herself. The patient is quite frustrated about the situation. Update 07/25/2014 : She returns for follow-up after last visit 8 months ago. She is accompanied by 3 of her daughters who reports that they've noticed some cognitive decline and memory loss since last visit. Patient has been in and out of the hospital this last year multiple times with strokes as well as dehydration and bladder infections. She is presently living at home and has 24-hour care. She spends most of the time in a wheelchair and does not have much social interaction and increases to go out. She is able to toe walk with one-person assist. She has some home therapy every day which makes her walk a little bit. She is tolerating liquids without bleeding  bruising or other side effects. She has not had headaches for quite some time and no definite documented seizures last couple of years. She is currently on valproic acid 375 twice daily. Update 10/29/2014 : She returns for follow-up after last visit 4 months ago. She is accompanied by her daughter who states that her memory and cognitive difficulties are about the same but in fact on Mini-Mental status testing today she shows a decline with score of 17/30. Clock  drawing is only 1/4 in am and naming is only 6. The patient has had complaints of nocturia and has to get up several times at night and does not sleep well at night. She has been to a urologist as well as family physician but the testing for infection has been negative. The patient is not very active during the day and does not engage and interaction. There have been no no issues with agitation, delusions or hallucinations. The family is willing to try Aricept but have concerns about possible side effects. They also want to try some natural supplements to help her sleep but her reluctant to take any sleeping pills. Update 02/06/2015 : She returns for f/u after last visit 3 months ago. She was admitted to [facility] on 12/18/14 with worsening weakness and gait difficulties and was found to have UTI and dehydration and was deconditioned.MRI showed no acute infarct. She was transferred to SNF for rehab and has slowly improved but is not participating as much with therapy and barely walks and needs 2 person assist. She at home now with her daughters but does not walk as much as she used to prior to her recent hospitalization.She tried aricept in [place] but did not tolerate it due to GI side effects. Update 10/16/2015 :  She returns for follow-up after last visit 8 months ago. She is accompanied by her 2 daughters. Patient was recently hospitalized in 08/07/15 with worsening of left-sided symptoms. MRI scan of the brain did not show an acute infarct. Respiratory deconditioned following recurrent bouts of recent bladder infections. Patient has now been started on cranberry juice and family feels that may be helping. She was seen in the ER 2 weeks ago and had a positive blood culture. She had been on Keflex she was given 2 doses of IV antibiotics and blood culture repeated the next day was negative and oral antibiotics were continued. She continues to live at home and has 24-hour care provided by her daughters. She is able to walk  with a walker but needs close one-person assist. She continues to have persistent left-sided weakness. She is on eliquis which is tolerating well without bleeding or bruising. She does have an upcoming appointment with a urologist to discuss her increased frequency of urination. Update 04/12/2016 : She returns for follow-up after last visit 6 months ago. She is accompanied by 2 of her daughters. She was recently hospitalized with another stroke unfortunately. She presented on 11/23/15 with the left frontal headache and right hemiparesis. CT scan of the head showed no acute abnormality but MRI scan which I have personally reviewed did confirm a left anterior cerebral artery acute infarct. Old infarcts are noted in the right basal ganglia, left parietal lobe and right cerebellum. EEG showed left temporal focal irritability but no definite seizure activity. LDL cholesterol is elevated at 116 mg percent. Echocardiogram and Dopplers were not repeated and they had previously been done in May 2017. Patient had previously been on Xarelto and had been switched to eliquis for anticoagulation  for chronic atrial fibrillation. After prolonged discussion with patient and daughters about lack of data about head-to-head comparison amongst the different newer anticoagulants in stroke prevention it was decided to keep her on eliquis and not switch. She was supposed to be on Lipitor but it had been stopped and she was put back on it. Patient is currently living at [facility]. She is getting physical therapy. She is able to walk with physical therapy with 2 person assist and standard little bit and take a few steps. Patient has had bouts of urinary tract infection with Klebsiella recently and in recent months has had cellulitis and pneumonia requiring antibiotic courses. She was on Depakote 250 twice daily. Family is concerned that in recent weeks she's had 2 spells of transient staring unresponsively while working with the  therapist. She feels tired after these episodes which last barely a few minutes. There has been no witnessed mouth frothing, twitching, clonic or tonic activity noted. She had an EEG done in the hospital which had shown some left temporal slowing and hence Depakote was started in the last admission. ROS:   14 system review of systems is positive for  activity and  , trouble swallowing, incontinence of bowels and bladder, memory loss, speech difficulty, weakness, skin wounds and all other systems negative PMH:  Past Medical History:  Diagnosis Date  . Acute cystitis without hematuria   . Acute encephalopathy   . Arthritis   . Atrial fibrillation ([place])    on Eliquis Rx  . Atrial flutter ([place]) January, 2012  . Chronic anticoagulation   . Colon polyp   . Constipation   . Hearing difficulty   . Hemorrhoids   . High cholesterol   . HLD (hyperlipidemia)   . Hypertension   . Left leg weakness 02/05/2013  . Left-sided weakness   . Malnutrition ([place])   . Migraine   . Mild cognitive impairment with memory loss   . Osteoporosis   . Stroke ([place]) 08/02/12    right lenticular nucleus infarct  . TIA (transient ischemic attack)   . Torus palatinus     Social History:  Social History   Social History  . Marital status: Widowed    Spouse name: N/A  . Number of children: 3  . Years of education: college   Occupational History  . 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 Smoking status: Former Research scientist (life sciences)  . Smokeless tobacco: Never Used  . Alcohol use No     Comment: 08/01/2012 "glass of wine on special occasions"  . Drug use: No  . Sexual activity: No   Other Topics Concern  . Not on file   Social History Narrative   Patient lives at home with daughters.   Caffeine use: 1/2-1 cup of coffee occasionally         Diet: Regular      Do you drink/ eat things with caffeine? Yes      Marital status:   Widowed                            What year were you  married ?       Do you live in a house, apartment,assistred living, condo, trailer, etc.)? House      Is it one or more stories? Yes      How many persons live in your home ? 4  Do you have any pets in your home ?(please list) Yes/ 1 Dog, 2 Cats      Current or past profession: Editor      Do you exercise? No                             Type & how often:      Do you have a living will? Yes      Do you have a DNR form?  Yes                     If not, do you want to discuss one?       Do you have signed POA?HPOA forms? Yes                If so, please bring to your        appointment      regular    Medications:   Current Outpatient Prescriptions on File Prior to Visit  Medication Sig Dispense Refill  . acetaminophen (TYLENOL) 500 MG tablet Take 500 mg by mouth every 8 (eight) hours as needed.    Marland Kitchen apixaban (ELIQUIS) 5 MG TABS tablet Take 1 tablet (5 mg total) by mouth 2 (two) times daily. 60 tablet 0  . atorvastatin (LIPITOR) 40 MG tablet Take 1 tablet (40 mg total) by mouth daily at 6 PM. 30 tablet 0  . b complex vitamins tablet Take 1 tablet by mouth 2 (two) times daily.     . Calcium Carb-Cholecalciferol (CALCIUM 600 + D) 600-200 MG-UNIT TABS Take 1 tablet by mouth 2 (two) times daily.    . cetirizine (ZYRTEC) 10 MG tablet Take 10 mg by mouth at bedtime.     . Cholecalciferol (VITAMIN D3) 2000 units TABS Take 1 tablet by mouth daily.    . Cranberry 250 MG CAPS Take 2 capsules by mouth daily. Take 2 capsules to = 500 mg po qd    . hydrocortisone (ANUSOL-HC) 2.5 % rectal cream Place 1 application rectally as needed for hemorrhoids or itching.    . hydrocortisone (ANUSOL-HC) 25 MG suppository Place 25 mg rectally as needed for hemorrhoids or itching.    . Melatonin 3 MG TABS Take 1 tablet by mouth at bedtime     . Multiple Vitamins-Minerals (MULTIVITAMIN WITH MINERALS) tablet Take 1 tablet by mouth daily.    Marland Kitchen olopatadine (PATANOL) 0.1 % ophthalmic solution PLACE 1 DROP  INTO BOTH EYES 2 (TWO) TIMES DAILY. 5 mL 1  . ondansetron (ZOFRAN) 4 MG tablet Take 1 tablet (4 mg total) by mouth every 6 (six) hours as needed for nausea. 10 tablet 0   No current facility-administered medications on file prior to visit.     Allergies:   Allergies  Allergen Reactions  . Aricept [Donepezil Hcl] Hypertension    With N/V  . Tramadol Hcl Other (See Comments)     lethargy, nausea  . Alendronate Sodium Rash and Other (See Comments)    puffy eyes    Physical Exam General: Frail malnourished looking elderly Caucasian lady seated, in no evident distress Head: head normocephalic and atraumatic.   Neck: supple with no carotid or supraclavicular bruits Cardiovascular: regular rate and rhythm, no murmurs Musculoskeletal: flexion deformity left hand wearing wrist protection to prevent skin wounds Skin:  no rash/petichiae Vascular:  Normal pulses all extremities Vitals:   04/12/16 1524  BP: 112/62  Pulse: 80  Neurologic Exam Mental Status: Awake and fully alert. Oriented to place and time. Recent and remote memory poor. Attention span, concentration and fund of knowledge slightly diminished. Mood and affect appropriate. Mini-Mental status exam not done but has deficits in orientation, attention, calculation, recall.    Cranial Nerves: Fundoscopic exam not done . Pupils equal, briskly reactive to light. Extraocular movements full without nystagmus. Visual fields full to confrontation. Hearing diminished slightly bilaterally.. Facial sensation intact. Face, tongue, palate moves normally and symmetrically.  Motor: Normal bulk and tone. Normal strength in all tested extremity muscles except mild weakness of left grip and intrinsic hand muscles. Mild right hemiparesis 4/5. Mild drift both upper and lower extremities  both grip and intrinsic hand muscles are weak. Sensory.: intact to touch and pinprick and vibratory sensation.  Coordination: Rapid alternating movements slow in all  extremities. Finger-to-nose and heel-to-shin  Cannot be performed accurately bilaterally. Gait and Station: Deferred as patient sitting in wheelchair and fall risk  Reflexes: 1+ and symmetric. Toes downgoing.      ASSESSMENT: 35 year with remote history of right hemispheric subcortical infarct in May 2014 with recent admission in  May 2015 with new right corpus callosal embolic infarct from atrial fibrillation. She has residual mild left hemiparesis and poor balance and gait difficulties. Recent left ACA embolic infarcts secondary to atrial fibrillation despite anticoagulation with now residual right hemiparesis as well. Recent episodes of transient staring and unresponsiveness likely complex partial seizures and symptomatic epilepsy PLAN: I had a long d/w patient and her 2 daughters about her recent recurrent embolic strokes from atrial fibrillation and discussed anticoagulation options, risk for recurrent stroke/TIAs, personally independently reviewed imaging studies and stroke evaluation results and answered questions.I clearly stated that there is no definitive data to suggest that any of the newer anticoagulants are superior to eliquis. And that there are no comparative head-to-head trials amongst them. Continue Eliquis (apixaban) daily  for secondary stroke prevention and maintain strict control of hypertension with blood pressure goal below 130/90, diabetes with hemoglobin A1c goal below 6.5% and lipids with LDL cholesterol goal below 70 mg/dL. I recommend increasing Depakote sprinkle dose to 375 mg twice daily and to check EEG for complex partial seizures. I encouraged her to continue participation with physical and occupational therapy. She will return for follow-up in 3 months with my nurse practitioner or call earlier if necessary. Greater than 50% time during this 25 minute visit was spent on counseling and coordination of care about stroke risk, prevention and treatment     Antony Contras,  MD   Note: This document was prepared with digital dictation and possible smart phrase technology. Any transcriptional errors that result from this process are unintentional

## 2016-04-12 NOTE — Patient Instructions (Addendum)
I had a long d/w patient and her 2 daughters about her recent recurrent embolic  Stroke from atrial fibrillation and discussed anticoagulation options, risk for recurrent stroke/TIAs, personally independently reviewed imaging studies and stroke evaluation results and answered questions.I clearly stated that there is no definitive data to suggest that any of the newer anticoagulants are superior to eliquis. Continue Eliquis (apixaban) daily  for secondary stroke prevention and maintain strict control of hypertension with blood pressure goal below 130/90, diabetes with hemoglobin A1c goal below 6.5% and lipids with LDL cholesterol goal below 70 mg/dL. I recommend increasing Depakote sprinkle dose to 375 mg twice daily and to check EEG for complex partial seizures. I encouraged her to continue participation with physical and occupational therapy. She will return for follow-up in 3 months with my nurse practitioner or call earlier if necessary.

## 2016-04-13 DIAGNOSIS — M6281 Muscle weakness (generalized): Secondary | ICD-10-CM | POA: Diagnosis not present

## 2016-04-13 DIAGNOSIS — R278 Other lack of coordination: Secondary | ICD-10-CM | POA: Diagnosis not present

## 2016-04-13 DIAGNOSIS — I69154 Hemiplegia and hemiparesis following nontraumatic intracerebral hemorrhage affecting left non-dominant side: Secondary | ICD-10-CM | POA: Diagnosis not present

## 2016-04-14 DIAGNOSIS — I69154 Hemiplegia and hemiparesis following nontraumatic intracerebral hemorrhage affecting left non-dominant side: Secondary | ICD-10-CM | POA: Diagnosis not present

## 2016-04-14 DIAGNOSIS — R278 Other lack of coordination: Secondary | ICD-10-CM | POA: Diagnosis not present

## 2016-04-14 DIAGNOSIS — M6281 Muscle weakness (generalized): Secondary | ICD-10-CM | POA: Diagnosis not present

## 2016-04-15 DIAGNOSIS — M6281 Muscle weakness (generalized): Secondary | ICD-10-CM | POA: Diagnosis not present

## 2016-04-15 DIAGNOSIS — I69154 Hemiplegia and hemiparesis following nontraumatic intracerebral hemorrhage affecting left non-dominant side: Secondary | ICD-10-CM | POA: Diagnosis not present

## 2016-04-15 DIAGNOSIS — R278 Other lack of coordination: Secondary | ICD-10-CM | POA: Diagnosis not present

## 2016-04-16 DIAGNOSIS — R278 Other lack of coordination: Secondary | ICD-10-CM | POA: Diagnosis not present

## 2016-04-16 DIAGNOSIS — I69154 Hemiplegia and hemiparesis following nontraumatic intracerebral hemorrhage affecting left non-dominant side: Secondary | ICD-10-CM | POA: Diagnosis not present

## 2016-04-16 DIAGNOSIS — M6281 Muscle weakness (generalized): Secondary | ICD-10-CM | POA: Diagnosis not present

## 2016-04-17 DIAGNOSIS — M6281 Muscle weakness (generalized): Secondary | ICD-10-CM | POA: Diagnosis not present

## 2016-04-17 DIAGNOSIS — R278 Other lack of coordination: Secondary | ICD-10-CM | POA: Diagnosis not present

## 2016-04-17 DIAGNOSIS — I69154 Hemiplegia and hemiparesis following nontraumatic intracerebral hemorrhage affecting left non-dominant side: Secondary | ICD-10-CM | POA: Diagnosis not present

## 2016-04-19 DIAGNOSIS — M6281 Muscle weakness (generalized): Secondary | ICD-10-CM | POA: Diagnosis not present

## 2016-04-19 DIAGNOSIS — I69154 Hemiplegia and hemiparesis following nontraumatic intracerebral hemorrhage affecting left non-dominant side: Secondary | ICD-10-CM | POA: Diagnosis not present

## 2016-04-19 DIAGNOSIS — R278 Other lack of coordination: Secondary | ICD-10-CM | POA: Diagnosis not present

## 2016-04-20 DIAGNOSIS — M6281 Muscle weakness (generalized): Secondary | ICD-10-CM | POA: Diagnosis not present

## 2016-04-20 DIAGNOSIS — R278 Other lack of coordination: Secondary | ICD-10-CM | POA: Diagnosis not present

## 2016-04-20 DIAGNOSIS — I69154 Hemiplegia and hemiparesis following nontraumatic intracerebral hemorrhage affecting left non-dominant side: Secondary | ICD-10-CM | POA: Diagnosis not present

## 2016-04-21 DIAGNOSIS — R278 Other lack of coordination: Secondary | ICD-10-CM | POA: Diagnosis not present

## 2016-04-21 DIAGNOSIS — I69154 Hemiplegia and hemiparesis following nontraumatic intracerebral hemorrhage affecting left non-dominant side: Secondary | ICD-10-CM | POA: Diagnosis not present

## 2016-04-21 DIAGNOSIS — M6281 Muscle weakness (generalized): Secondary | ICD-10-CM | POA: Diagnosis not present

## 2016-04-22 DIAGNOSIS — I69154 Hemiplegia and hemiparesis following nontraumatic intracerebral hemorrhage affecting left non-dominant side: Secondary | ICD-10-CM | POA: Diagnosis not present

## 2016-04-22 DIAGNOSIS — M6281 Muscle weakness (generalized): Secondary | ICD-10-CM | POA: Diagnosis not present

## 2016-04-22 DIAGNOSIS — R278 Other lack of coordination: Secondary | ICD-10-CM | POA: Diagnosis not present

## 2016-04-23 DIAGNOSIS — M6281 Muscle weakness (generalized): Secondary | ICD-10-CM | POA: Diagnosis not present

## 2016-04-23 DIAGNOSIS — R278 Other lack of coordination: Secondary | ICD-10-CM | POA: Diagnosis not present

## 2016-04-23 DIAGNOSIS — I69154 Hemiplegia and hemiparesis following nontraumatic intracerebral hemorrhage affecting left non-dominant side: Secondary | ICD-10-CM | POA: Diagnosis not present

## 2016-04-26 DIAGNOSIS — R278 Other lack of coordination: Secondary | ICD-10-CM | POA: Diagnosis not present

## 2016-04-26 DIAGNOSIS — M6281 Muscle weakness (generalized): Secondary | ICD-10-CM | POA: Diagnosis not present

## 2016-04-26 DIAGNOSIS — I69154 Hemiplegia and hemiparesis following nontraumatic intracerebral hemorrhage affecting left non-dominant side: Secondary | ICD-10-CM | POA: Diagnosis not present

## 2016-04-27 DIAGNOSIS — R278 Other lack of coordination: Secondary | ICD-10-CM | POA: Diagnosis not present

## 2016-04-27 DIAGNOSIS — M6281 Muscle weakness (generalized): Secondary | ICD-10-CM | POA: Diagnosis not present

## 2016-04-27 DIAGNOSIS — I69154 Hemiplegia and hemiparesis following nontraumatic intracerebral hemorrhage affecting left non-dominant side: Secondary | ICD-10-CM | POA: Diagnosis not present

## 2016-04-28 DIAGNOSIS — I69154 Hemiplegia and hemiparesis following nontraumatic intracerebral hemorrhage affecting left non-dominant side: Secondary | ICD-10-CM | POA: Diagnosis not present

## 2016-04-28 DIAGNOSIS — R278 Other lack of coordination: Secondary | ICD-10-CM | POA: Diagnosis not present

## 2016-04-28 DIAGNOSIS — M6281 Muscle weakness (generalized): Secondary | ICD-10-CM | POA: Diagnosis not present

## 2016-04-28 DIAGNOSIS — Z8673 Personal history of transient ischemic attack (TIA), and cerebral infarction without residual deficits: Secondary | ICD-10-CM | POA: Diagnosis not present

## 2016-04-28 DIAGNOSIS — D649 Anemia, unspecified: Secondary | ICD-10-CM | POA: Diagnosis not present

## 2016-04-28 DIAGNOSIS — B0089 Other herpesviral infection: Secondary | ICD-10-CM | POA: Diagnosis not present

## 2016-04-28 DIAGNOSIS — Z79899 Other long term (current) drug therapy: Secondary | ICD-10-CM | POA: Diagnosis not present

## 2016-04-28 LAB — BASIC METABOLIC PANEL
BUN: 12 mg/dL (ref 4–21)
Creatinine: 0.5 mg/dL (ref 0.5–1.1)
GLUCOSE: 104 mg/dL
POTASSIUM: 4.2 mmol/L (ref 3.4–5.3)
SODIUM: 144 mmol/L (ref 137–147)

## 2016-04-28 LAB — CBC AND DIFFERENTIAL
HEMATOCRIT: 37 % (ref 36–46)
HEMOGLOBIN: 11.6 g/dL — AB (ref 12.0–16.0)
Neutrophils Absolute: 5 /uL
Platelets: 176 10*3/uL (ref 150–399)
WBC: 6.9 10^3/mL

## 2016-04-29 ENCOUNTER — Ambulatory Visit (INDEPENDENT_AMBULATORY_CARE_PROVIDER_SITE_OTHER): Payer: Medicare Other | Admitting: Orthopaedic Surgery

## 2016-04-29 DIAGNOSIS — R278 Other lack of coordination: Secondary | ICD-10-CM | POA: Diagnosis not present

## 2016-04-29 DIAGNOSIS — M6281 Muscle weakness (generalized): Secondary | ICD-10-CM | POA: Diagnosis not present

## 2016-04-29 DIAGNOSIS — R2689 Other abnormalities of gait and mobility: Secondary | ICD-10-CM | POA: Diagnosis not present

## 2016-04-29 DIAGNOSIS — I69154 Hemiplegia and hemiparesis following nontraumatic intracerebral hemorrhage affecting left non-dominant side: Secondary | ICD-10-CM | POA: Diagnosis not present

## 2016-04-30 DIAGNOSIS — R2689 Other abnormalities of gait and mobility: Secondary | ICD-10-CM | POA: Diagnosis not present

## 2016-04-30 DIAGNOSIS — M6281 Muscle weakness (generalized): Secondary | ICD-10-CM | POA: Diagnosis not present

## 2016-04-30 DIAGNOSIS — I69154 Hemiplegia and hemiparesis following nontraumatic intracerebral hemorrhage affecting left non-dominant side: Secondary | ICD-10-CM | POA: Diagnosis not present

## 2016-04-30 DIAGNOSIS — R278 Other lack of coordination: Secondary | ICD-10-CM | POA: Diagnosis not present

## 2016-05-03 DIAGNOSIS — R278 Other lack of coordination: Secondary | ICD-10-CM | POA: Diagnosis not present

## 2016-05-03 DIAGNOSIS — M6281 Muscle weakness (generalized): Secondary | ICD-10-CM | POA: Diagnosis not present

## 2016-05-03 DIAGNOSIS — I69154 Hemiplegia and hemiparesis following nontraumatic intracerebral hemorrhage affecting left non-dominant side: Secondary | ICD-10-CM | POA: Diagnosis not present

## 2016-05-03 DIAGNOSIS — R2689 Other abnormalities of gait and mobility: Secondary | ICD-10-CM | POA: Diagnosis not present

## 2016-05-04 ENCOUNTER — Non-Acute Institutional Stay (SKILLED_NURSING_FACILITY): Payer: Medicare Other | Admitting: Adult Health

## 2016-05-04 ENCOUNTER — Encounter: Payer: Self-pay | Admitting: Adult Health

## 2016-05-04 DIAGNOSIS — E785 Hyperlipidemia, unspecified: Secondary | ICD-10-CM

## 2016-05-04 DIAGNOSIS — I480 Paroxysmal atrial fibrillation: Secondary | ICD-10-CM | POA: Diagnosis not present

## 2016-05-04 DIAGNOSIS — I69154 Hemiplegia and hemiparesis following nontraumatic intracerebral hemorrhage affecting left non-dominant side: Secondary | ICD-10-CM | POA: Diagnosis not present

## 2016-05-04 DIAGNOSIS — G40909 Epilepsy, unspecified, not intractable, without status epilepticus: Secondary | ICD-10-CM | POA: Diagnosis not present

## 2016-05-04 DIAGNOSIS — R2689 Other abnormalities of gait and mobility: Secondary | ICD-10-CM | POA: Diagnosis not present

## 2016-05-04 DIAGNOSIS — M6281 Muscle weakness (generalized): Secondary | ICD-10-CM | POA: Diagnosis not present

## 2016-05-04 DIAGNOSIS — B001 Herpesviral vesicular dermatitis: Secondary | ICD-10-CM | POA: Diagnosis not present

## 2016-05-04 DIAGNOSIS — R278 Other lack of coordination: Secondary | ICD-10-CM | POA: Diagnosis not present

## 2016-05-04 DIAGNOSIS — I10 Essential (primary) hypertension: Secondary | ICD-10-CM

## 2016-05-04 NOTE — Progress Notes (Signed)
Patient ID: TELESHIA LEMERE, female   DOB: 1924-06-07, 83 y.o.   MRN: [id number]    DATE:  05/04/2016   MRN:  [id number]  BIRTHDAY: 1925-03-13  Facility:  Nursing Home Location:  [place] Room Number: 203-B  LEVEL OF CARE:  SNF [phone])  Contact Information    Name Relation Home Work Deltana Daughter [phone]  [phone]   Plummer,Kim Daughter [phone]   Cenci,Kit Daughter [phone]  [phone]       Code Status History    Date Active Date Inactive Code Status Order ID Comments User Context   11/26/2015 12:26 AM 11/26/2015  9:47 PM DNR [id number]  Asencion Partridge, MD Inpatient   11/24/2015 12:38 AM 11/24/2015  1:59 PM DNR [id number]  Maryellen Pile, MD Inpatient   08/07/2015  4:52 AM 08/12/2015  8:32 PM Partial Code [id number]  Edwin Dada, MD Inpatient   12/18/2014  7:17 PM 12/21/2014  2:11 PM Partial Code [id number]  Willia Craze, NP Inpatient   10/31/2014  3:32 AM 10/31/2014  7:24 PM Full Code [id number]  Theressa Millard, MD Inpatient   08/28/2013 12:58 AM 08/30/2013  4:40 PM Full Code [id number]  Kelvin Cellar, MD Inpatient   03/09/2013  6:50 PM 03/12/2013  6:27 PM Full Code [id number]  Verlee Monte, MD Inpatient   02/15/2013  6:26 PM 03/03/2013 12:40 PM Full Code [id number]  Cathlyn Parsons, PA-C Inpatient   02/12/2013 12:50 PM 02/15/2013  6:26 PM Full Code [id number]  Velvet Bathe, MD Inpatient   02/05/2013  7:35 PM 02/07/2013  8:48 PM Full Code [id number]  Kelvin Cellar, MD Inpatient   08/01/2012  5:49 PM 08/03/2012  2:28 PM Full Code [id number]  Melton Alar, PA-C Inpatient    Questions for Most Recent Historical Code Status (Order [id number])    Question Answer Comment   In the event of cardiac or respiratory ARREST Do not call a "code blue"    In the event of cardiac or respiratory ARREST Do not perform Intubation, CPR, defibrillation or ACLS    In the event of cardiac or respiratory ARREST Use  medication by any route, position, wound care, and other measures to relive pain and suffering. May use oxygen, suction and manual treatment of airway obstruction as needed for comfort.         Advance Directive Documentation   Flowsheet Row Most Recent Value  Type of Advance Directive  Out of facility DNR (pink MOST or yellow form)  Pre-existing out of facility DNR order (yellow form or pink MOST form)  No data  "MOST" Form in Place?  No data       Chief Complaint  Patient presents with  . Medical Management of Chronic Issues    HISTORY OF PRESENT ILLNESS:  The patient is an 42-YO female who is being seen for a routine visit. Norvasc was  Recently and BPs has been stable so far. She was recently treated for UTI with Bactrim X 7 days. She recently complained of sore throat and throat culture showed no beta-hemolytic Streptococcus group A recovered. Sore throat is now resolved. She was recently started on Abreva cream for fever blisters.     PAST MEDICAL HISTORY:  Past Medical History:  Diagnosis Date  . Acute cystitis without hematuria   . Acute encephalopathy   . Arthritis   . Atrial fibrillation ([place])    on Eliquis Rx  . Atrial flutter ([place]) January, 2012  .  Chronic anticoagulation   . Colon polyp   . Constipation   . Hearing difficulty   . Hemorrhoids   . High cholesterol   . HLD (hyperlipidemia)   . Hypertension   . Left leg weakness 02/05/2013  . Left-sided weakness   . Malnutrition ([place])   . Migraine   . Mild cognitive impairment with memory loss   . Osteoporosis   . Stroke ([place]) 08/02/12    right lenticular nucleus infarct  . TIA (transient ischemic attack)   . Torus palatinus      CURRENT MEDICATIONS: Reviewed  Patient's Medications  New Prescriptions   No medications on file  Previous Medications   ACETAMINOPHEN (CHLORASEPTIC SORE THROAT PO)    Take 2 sprays by mouth every 4 (four) hours as needed (sore throat). x2 weeks   ACETAMINOPHEN (TYLENOL) 325  MG TABLET    Take 650 mg by mouth every 4 (four) hours as needed for fever. For temp greater than 101.5 per SO   ACETAMINOPHEN (TYLENOL) 500 MG TABLET    Take 500 mg by mouth every 8 (eight) hours as needed.   ALBUTEROL (PROVENTIL) (2.5 MG/3ML) 0.083% NEBULIZER SOLUTION    Take 2.5 mg by nebulization 3 (three) times daily as needed for wheezing or shortness of breath.   APIXABAN (ELIQUIS) 5 MG TABS TABLET    Take 1 tablet (5 mg total) by mouth 2 (two) times daily.   ATORVASTATIN (LIPITOR) 40 MG TABLET    Take 1 tablet (40 mg total) by mouth daily at 6 PM.   B COMPLEX VITAMINS TABLET    Take 1 tablet by mouth 2 (two) times daily.    CALCIUM CARB-CHOLECALCIFEROL (CALCIUM 600 + D) 600-200 MG-UNIT TABS    Take 1 tablet by mouth 2 (two) times daily.   CETIRIZINE (ZYRTEC) 10 MG TABLET    Take 10 mg by mouth at bedtime.    CHOLECALCIFEROL (VITAMIN D3) 2000 UNITS TABS    Take 1 tablet by mouth daily.   CRANBERRY 250 MG CAPS    Take 2 capsules by mouth daily. Take 2 capsules to = 500 mg po qd   DIVALPROEX (DEPAKOTE) 125 MG DR TABLET    Take 3 tablets (375 mg total) by mouth 2 (two) times daily.   DOCOSANOL (ABREVA) 10 % CREA    Apply 1 application topically 5 (five) times daily.   HYDROCORTISONE (ANUSOL-HC) 2.5 % RECTAL CREAM    Place 1 application rectally as needed for hemorrhoids or itching.   HYDROCORTISONE (ANUSOL-HC) 25 MG SUPPOSITORY    Place 25 mg rectally as needed for hemorrhoids or itching.   MELATONIN 3 MG TABS    Take 1 tablet by mouth at bedtime    MULTIPLE VITAMINS-MINERALS (MULTIVITAMIN WITH MINERALS) TABLET    Take 1 tablet by mouth daily.   OLOPATADINE (PATANOL) 0.1 % OPHTHALMIC SOLUTION    PLACE 1 DROP INTO BOTH EYES 2 (TWO) TIMES DAILY.   ONDANSETRON (ZOFRAN) 4 MG TABLET    Take 1 tablet (4 mg total) by mouth every 6 (six) hours as needed for nausea.   PROTEIN (PROCEL) POWD    Take 1 scoop by mouth 2 (two) times daily.  Modified Medications   No medications on file  Discontinued  Medications   No medications on file     Allergies  Allergen Reactions  . Aricept [Donepezil Hcl] Hypertension    With N/V  . Tramadol Hcl Other (See Comments)     lethargy, nausea  . Alendronate  Sodium Rash and Other (See Comments)    puffy eyes     REVIEW OF SYSTEMS:  GENERAL: no change in appetite, no fatigue, no weight changes, no fever, chills or weakness EYES: Denies change in vision, dry eyes, eye pain, itching or discharge EARS: Denies change in hearing, ringing in ears, or earache NOSE: Denies nasal congestion or epistaxis MOUTH and THROAT: Denies oral discomfort, gingival pain or bleeding, pain from teeth or hoarseness   RESPIRATORY: no cough, SOB, DOE, wheezing, hemoptysis CARDIAC: no chest pain, edema or palpitations GI: no abdominal pain, diarrhea, constipation, heart burn, nausea or vomiting GU: Denies dysuria, frequency, hematuria, incontinence, or discharge PSYCHIATRIC: Denies feeling of depression or anxiety. No report of hallucinations, insomnia, paranoia, or agitation     PHYSICAL EXAMINATION  GENERAL APPEARANCE: Well nourished. In no acute distress. Normal body habitus SKIN:  Skin is warm and dry.  HEAD: Normal in size and contour. No evidence of trauma EYES: Lids open and close normally. No blepharitis, entropion or ectropion. PERRL. Conjunctivae are clear and sclerae are white. Lenses are without opacity EARS: Pinnae are normal. Patient hears normal voice tunes of the examiner MOUTH and THROAT: Lips with dried/brown lesions. Oral mucosa is moist and without lesions. Tongue is normal in shape, size, and color and without lesions NECK: supple, trachea midline, no neck masses, no thyroid tenderness, no thyromegaly LYMPHATICS: no LAN in the neck, no supraclavicular LAN RESPIRATORY: breathing is even & unlabored, BS CTAB CARDIAC: RRR, no murmur,no extra heart sounds, no edema GI: abdomen soft, normal BS, no masses, no tenderness, no hepatomegaly, no  splenomegaly EXTREMITIES:  Able to move 4 extremities , left hemiparesis, left hand has splint PSYCHIATRIC: Alert to person, disoriented to time and place.  Affect and behavior are appropriate    LABS/RADIOLOGY: Labs reviewed: Basic Metabolic Panel:  Recent Labs  08/08/15 0539  08/09/15 0633  09/12/15 1250  11/24/15 0416 11/25/15 0523 11/26/15 0420  01/23/16 04/01/16 04/06/16 04/28/16  NA 148*  < > 144  < > 142  < > 143 141 139  < > 140 142 141 144  K 4.0  < > 4.1  < > 4.2  < > 3.8 4.3 4.4  < > 4.2 4.6  --  4.2  CL 108  < > 109  < > 104  < > 111 110 108  --   --   --   --   --   CO2 29  < > 25  < > 31  < > _0 --   --   --   --   --   GLUCOSE 95  < > 102*  < > 89  < > 96 101* 114*  --   --   --   --   --   BUN 9  < > 10  < > 15  < > _1 < > _2 CREATININE 0.74  < > 0.76  < > 0.72  < > 0.82 0.60 0.67  < > 0.4* 0.6 0.4* 0.5  CALCIUM 8.7*  < > 8.3*  < > 9.1  < > 8.3* 8.3* 8.5*  --   --   --   --   --   MG 2.1  --  2.0  --  1.9  --   --   --   --   --   --   --   --   --   < > =  values in this interval not displayed. Liver Function Tests:  Recent Labs  08/08/15 0539 09/12/15 1250 11/23/15 2055 03/15/16  AST _0 ALT 12* 11* 13* 19  ALKPHOS 43 49 61 112  BILITOT 0.6 0.4 0.4  --   PROT 6.0* 6.4* 6.0*  --   ALBUMIN 3.3* 3.4* 3.3*  --     Recent Labs  06/23/15 1643 08/08/15 1303  AMMONIA 21 16   CBC:  Recent Labs  11/23/15 2055  11/25/15 0523 11/26/15 0420  12/31/15 01/23/16 04/01/16 04/06/16 04/28/16  WBC 4.9  --  5.4 8.2  < > 7.6 5.9 5.4 5.0 6.9  NEUTROABS 2.6  --   --   --   < > 5 4  --   --  5  HGB 12.3  < > 11.9* 12.3  < > 11.5* 10.6* 11.6* 11.8* 11.6*  HCT 38.9  < > 36.9 37.9  < > 36 33* 37 36 37  MCV 96.5  --  93.7 94.0  --   --   --   --   --   --   PLT 183  --  186 185  < > 240 209 226 297 176  < > = values in this interval not displayed.  Lipid Panel:  Recent Labs  08/07/15 0551 11/24/15 0417 03/15/16  HDL 47 48 49    Cardiac Enzymes:  Recent Labs  07/31/15 1115 09/12/15 1250  TROPONINI <0.03 <0.03   CBG:  Recent Labs  06/23/15 1715 08/07/15 0202 11/23/15 2111  GLUCAP 79 102* 154*     ASSESSMENT/PLAN:  Fever blisters - continue Abreva cream to lips  for a total of 10 days  Essential hypertension - currently not on any medication; stable  Hyperlipidemia -  continue Lipitor 40 mg 1 tab by mouth daily  Seizure disorder  - Recently increased Depakote DR 125 mg to 375 mg twice a day  Atrial fibrillation - rate controlled; continue Eliquis 5 mg BID     Goals of care:  Long-term care    Leena Tiede C. [place] - NP [employer] [phone]

## 2016-05-05 DIAGNOSIS — R2689 Other abnormalities of gait and mobility: Secondary | ICD-10-CM | POA: Diagnosis not present

## 2016-05-05 DIAGNOSIS — M6281 Muscle weakness (generalized): Secondary | ICD-10-CM | POA: Diagnosis not present

## 2016-05-05 DIAGNOSIS — R278 Other lack of coordination: Secondary | ICD-10-CM | POA: Diagnosis not present

## 2016-05-05 DIAGNOSIS — I69154 Hemiplegia and hemiparesis following nontraumatic intracerebral hemorrhage affecting left non-dominant side: Secondary | ICD-10-CM | POA: Diagnosis not present

## 2016-05-06 DIAGNOSIS — I69154 Hemiplegia and hemiparesis following nontraumatic intracerebral hemorrhage affecting left non-dominant side: Secondary | ICD-10-CM | POA: Diagnosis not present

## 2016-05-06 DIAGNOSIS — M6281 Muscle weakness (generalized): Secondary | ICD-10-CM | POA: Diagnosis not present

## 2016-05-06 DIAGNOSIS — R2689 Other abnormalities of gait and mobility: Secondary | ICD-10-CM | POA: Diagnosis not present

## 2016-05-06 DIAGNOSIS — R278 Other lack of coordination: Secondary | ICD-10-CM | POA: Diagnosis not present

## 2016-05-07 DIAGNOSIS — R278 Other lack of coordination: Secondary | ICD-10-CM | POA: Diagnosis not present

## 2016-05-07 DIAGNOSIS — M6281 Muscle weakness (generalized): Secondary | ICD-10-CM | POA: Diagnosis not present

## 2016-05-07 DIAGNOSIS — R2689 Other abnormalities of gait and mobility: Secondary | ICD-10-CM | POA: Diagnosis not present

## 2016-05-07 DIAGNOSIS — I69154 Hemiplegia and hemiparesis following nontraumatic intracerebral hemorrhage affecting left non-dominant side: Secondary | ICD-10-CM | POA: Diagnosis not present

## 2016-05-10 DIAGNOSIS — I69154 Hemiplegia and hemiparesis following nontraumatic intracerebral hemorrhage affecting left non-dominant side: Secondary | ICD-10-CM | POA: Diagnosis not present

## 2016-05-10 DIAGNOSIS — R2689 Other abnormalities of gait and mobility: Secondary | ICD-10-CM | POA: Diagnosis not present

## 2016-05-10 DIAGNOSIS — R278 Other lack of coordination: Secondary | ICD-10-CM | POA: Diagnosis not present

## 2016-05-10 DIAGNOSIS — M6281 Muscle weakness (generalized): Secondary | ICD-10-CM | POA: Diagnosis not present

## 2016-05-11 DIAGNOSIS — I69154 Hemiplegia and hemiparesis following nontraumatic intracerebral hemorrhage affecting left non-dominant side: Secondary | ICD-10-CM | POA: Diagnosis not present

## 2016-05-11 DIAGNOSIS — R2689 Other abnormalities of gait and mobility: Secondary | ICD-10-CM | POA: Diagnosis not present

## 2016-05-11 DIAGNOSIS — R278 Other lack of coordination: Secondary | ICD-10-CM | POA: Diagnosis not present

## 2016-05-11 DIAGNOSIS — M6281 Muscle weakness (generalized): Secondary | ICD-10-CM | POA: Diagnosis not present

## 2016-05-12 ENCOUNTER — Ambulatory Visit (INDEPENDENT_AMBULATORY_CARE_PROVIDER_SITE_OTHER): Payer: Medicare Other | Admitting: Neurology

## 2016-05-12 DIAGNOSIS — R278 Other lack of coordination: Secondary | ICD-10-CM | POA: Diagnosis not present

## 2016-05-12 DIAGNOSIS — I69154 Hemiplegia and hemiparesis following nontraumatic intracerebral hemorrhage affecting left non-dominant side: Secondary | ICD-10-CM | POA: Diagnosis not present

## 2016-05-12 DIAGNOSIS — M6281 Muscle weakness (generalized): Secondary | ICD-10-CM | POA: Diagnosis not present

## 2016-05-12 DIAGNOSIS — R4182 Altered mental status, unspecified: Secondary | ICD-10-CM

## 2016-05-12 DIAGNOSIS — I63421 Cerebral infarction due to embolism of right anterior cerebral artery: Secondary | ICD-10-CM

## 2016-05-12 DIAGNOSIS — R2689 Other abnormalities of gait and mobility: Secondary | ICD-10-CM | POA: Diagnosis not present

## 2016-05-13 DIAGNOSIS — I69154 Hemiplegia and hemiparesis following nontraumatic intracerebral hemorrhage affecting left non-dominant side: Secondary | ICD-10-CM | POA: Diagnosis not present

## 2016-05-13 DIAGNOSIS — M6281 Muscle weakness (generalized): Secondary | ICD-10-CM | POA: Diagnosis not present

## 2016-05-13 DIAGNOSIS — R278 Other lack of coordination: Secondary | ICD-10-CM | POA: Diagnosis not present

## 2016-05-13 DIAGNOSIS — R2689 Other abnormalities of gait and mobility: Secondary | ICD-10-CM | POA: Diagnosis not present

## 2016-05-14 DIAGNOSIS — M6281 Muscle weakness (generalized): Secondary | ICD-10-CM | POA: Diagnosis not present

## 2016-05-14 DIAGNOSIS — R278 Other lack of coordination: Secondary | ICD-10-CM | POA: Diagnosis not present

## 2016-05-14 DIAGNOSIS — I69154 Hemiplegia and hemiparesis following nontraumatic intracerebral hemorrhage affecting left non-dominant side: Secondary | ICD-10-CM | POA: Diagnosis not present

## 2016-05-14 DIAGNOSIS — R2689 Other abnormalities of gait and mobility: Secondary | ICD-10-CM | POA: Diagnosis not present

## 2016-05-15 DIAGNOSIS — M6281 Muscle weakness (generalized): Secondary | ICD-10-CM | POA: Diagnosis not present

## 2016-05-15 DIAGNOSIS — R2689 Other abnormalities of gait and mobility: Secondary | ICD-10-CM | POA: Diagnosis not present

## 2016-05-15 DIAGNOSIS — R278 Other lack of coordination: Secondary | ICD-10-CM | POA: Diagnosis not present

## 2016-05-15 DIAGNOSIS — I69154 Hemiplegia and hemiparesis following nontraumatic intracerebral hemorrhage affecting left non-dominant side: Secondary | ICD-10-CM | POA: Diagnosis not present

## 2016-05-17 ENCOUNTER — Telehealth: Payer: Self-pay

## 2016-05-17 DIAGNOSIS — I69154 Hemiplegia and hemiparesis following nontraumatic intracerebral hemorrhage affecting left non-dominant side: Secondary | ICD-10-CM | POA: Diagnosis not present

## 2016-05-17 DIAGNOSIS — R2689 Other abnormalities of gait and mobility: Secondary | ICD-10-CM | POA: Diagnosis not present

## 2016-05-17 DIAGNOSIS — M6281 Muscle weakness (generalized): Secondary | ICD-10-CM | POA: Diagnosis not present

## 2016-05-17 DIAGNOSIS — R278 Other lack of coordination: Secondary | ICD-10-CM | POA: Diagnosis not present

## 2016-05-17 NOTE — Telephone Encounter (Signed)
Rn call [name] pts daughter on [place]. Rn stated the EEG was suboptimal due to excess patient movements, no definite seizure activity was noted. Pts daughter verbalized understand.

## 2016-05-17 NOTE — Telephone Encounter (Signed)
-----   Message from Garvin Fila, MD sent at 05/15/2016 10:08 PM EST ----- Kindly inform patient that EEG study was suboptimal due to excess patient movement but no definite seizure activity was noted.

## 2016-05-18 DIAGNOSIS — R278 Other lack of coordination: Secondary | ICD-10-CM | POA: Diagnosis not present

## 2016-05-18 DIAGNOSIS — I69154 Hemiplegia and hemiparesis following nontraumatic intracerebral hemorrhage affecting left non-dominant side: Secondary | ICD-10-CM | POA: Diagnosis not present

## 2016-05-18 DIAGNOSIS — R2689 Other abnormalities of gait and mobility: Secondary | ICD-10-CM | POA: Diagnosis not present

## 2016-05-18 DIAGNOSIS — M6281 Muscle weakness (generalized): Secondary | ICD-10-CM | POA: Diagnosis not present

## 2016-05-19 DIAGNOSIS — R2689 Other abnormalities of gait and mobility: Secondary | ICD-10-CM | POA: Diagnosis not present

## 2016-05-19 DIAGNOSIS — R278 Other lack of coordination: Secondary | ICD-10-CM | POA: Diagnosis not present

## 2016-05-19 DIAGNOSIS — I69154 Hemiplegia and hemiparesis following nontraumatic intracerebral hemorrhage affecting left non-dominant side: Secondary | ICD-10-CM | POA: Diagnosis not present

## 2016-05-19 DIAGNOSIS — M6281 Muscle weakness (generalized): Secondary | ICD-10-CM | POA: Diagnosis not present

## 2016-05-20 DIAGNOSIS — M6281 Muscle weakness (generalized): Secondary | ICD-10-CM | POA: Diagnosis not present

## 2016-05-20 DIAGNOSIS — R278 Other lack of coordination: Secondary | ICD-10-CM | POA: Diagnosis not present

## 2016-05-20 DIAGNOSIS — I69154 Hemiplegia and hemiparesis following nontraumatic intracerebral hemorrhage affecting left non-dominant side: Secondary | ICD-10-CM | POA: Diagnosis not present

## 2016-05-20 DIAGNOSIS — R2689 Other abnormalities of gait and mobility: Secondary | ICD-10-CM | POA: Diagnosis not present

## 2016-05-21 DIAGNOSIS — R278 Other lack of coordination: Secondary | ICD-10-CM | POA: Diagnosis not present

## 2016-05-21 DIAGNOSIS — M6281 Muscle weakness (generalized): Secondary | ICD-10-CM | POA: Diagnosis not present

## 2016-05-21 DIAGNOSIS — I69154 Hemiplegia and hemiparesis following nontraumatic intracerebral hemorrhage affecting left non-dominant side: Secondary | ICD-10-CM | POA: Diagnosis not present

## 2016-05-21 DIAGNOSIS — R2689 Other abnormalities of gait and mobility: Secondary | ICD-10-CM | POA: Diagnosis not present

## 2016-05-22 DIAGNOSIS — M79675 Pain in left toe(s): Secondary | ICD-10-CM | POA: Diagnosis not present

## 2016-05-24 DIAGNOSIS — M6281 Muscle weakness (generalized): Secondary | ICD-10-CM | POA: Diagnosis not present

## 2016-05-24 DIAGNOSIS — R278 Other lack of coordination: Secondary | ICD-10-CM | POA: Diagnosis not present

## 2016-05-24 DIAGNOSIS — R55 Syncope and collapse: Secondary | ICD-10-CM | POA: Diagnosis not present

## 2016-05-24 DIAGNOSIS — R2689 Other abnormalities of gait and mobility: Secondary | ICD-10-CM | POA: Diagnosis not present

## 2016-05-24 DIAGNOSIS — I69154 Hemiplegia and hemiparesis following nontraumatic intracerebral hemorrhage affecting left non-dominant side: Secondary | ICD-10-CM | POA: Diagnosis not present

## 2016-05-25 ENCOUNTER — Ambulatory Visit: Payer: Medicare Other | Admitting: Podiatry

## 2016-05-25 DIAGNOSIS — M6281 Muscle weakness (generalized): Secondary | ICD-10-CM | POA: Diagnosis not present

## 2016-05-25 DIAGNOSIS — R2689 Other abnormalities of gait and mobility: Secondary | ICD-10-CM | POA: Diagnosis not present

## 2016-05-25 DIAGNOSIS — I69154 Hemiplegia and hemiparesis following nontraumatic intracerebral hemorrhage affecting left non-dominant side: Secondary | ICD-10-CM | POA: Diagnosis not present

## 2016-05-25 DIAGNOSIS — R278 Other lack of coordination: Secondary | ICD-10-CM | POA: Diagnosis not present

## 2016-05-26 ENCOUNTER — Ambulatory Visit (INDEPENDENT_AMBULATORY_CARE_PROVIDER_SITE_OTHER): Payer: Medicare Other | Admitting: Podiatry

## 2016-05-26 ENCOUNTER — Encounter: Payer: Self-pay | Admitting: Podiatry

## 2016-05-26 DIAGNOSIS — I69154 Hemiplegia and hemiparesis following nontraumatic intracerebral hemorrhage affecting left non-dominant side: Secondary | ICD-10-CM | POA: Diagnosis not present

## 2016-05-26 DIAGNOSIS — I999 Unspecified disorder of circulatory system: Secondary | ICD-10-CM | POA: Diagnosis not present

## 2016-05-26 DIAGNOSIS — I63421 Cerebral infarction due to embolism of right anterior cerebral artery: Secondary | ICD-10-CM

## 2016-05-26 DIAGNOSIS — L600 Ingrowing nail: Secondary | ICD-10-CM | POA: Diagnosis not present

## 2016-05-26 DIAGNOSIS — R2689 Other abnormalities of gait and mobility: Secondary | ICD-10-CM | POA: Diagnosis not present

## 2016-05-26 DIAGNOSIS — M6281 Muscle weakness (generalized): Secondary | ICD-10-CM | POA: Diagnosis not present

## 2016-05-26 DIAGNOSIS — R278 Other lack of coordination: Secondary | ICD-10-CM | POA: Diagnosis not present

## 2016-05-26 NOTE — Progress Notes (Signed)
   Subjective:    Patient ID: [name], female    DOB: 07/16/1924, 83 y.o.   MRN: [id number]  HPI  Chief Complaint  Patient presents with  . Nail Problem    Left; Great Toe; Swollen, Red, Discolored/Dark underneath nail. Pt is currently taking Doxycycline.   . Skin Lesion    Left; Bottom of heel; Red, Swollen with yellow discharge. Pt's daughter stated that the heel has been getting treatment since last August, pt is nursing home.        Review of Systems     Objective:   Physical Exam        Assessment & Plan:

## 2016-05-27 DIAGNOSIS — M6281 Muscle weakness (generalized): Secondary | ICD-10-CM | POA: Diagnosis not present

## 2016-05-27 DIAGNOSIS — R278 Other lack of coordination: Secondary | ICD-10-CM | POA: Diagnosis not present

## 2016-05-27 NOTE — Progress Notes (Signed)
Subjective:     Patient ID: QUEENIE PIER, female   DOB: 1924/07/04, 83 y.o.   MRN: [id number]  HPI patient presents with discoloration of the left big toenail with thickness and family members who were concerned about the appearance. Patient is noted wheelchair and is not mentally stable   Review of Systems  All other systems reviewed and are negative.      Objective:   Physical Exam  Skin: Skin is warm and dry.   I noted diminished circulatory status both DP PT pulses with varicosities bilateral and patient is noted to have significant thickness of the left hallux nail with dystrophic changes noted and mild looseness the nail. There is no proximal edema erythema or drainage noted     Assessment:     Appears to be a localized process with probable trauma    Plan:     H&P and spent a great of time going over this with patient's family. I've recommended soaks but I do not recommend aggressive treatment of removal nail and less it were to become more painful red or there is drainage. Patient be seen back as needed

## 2016-05-28 ENCOUNTER — Encounter: Payer: Self-pay | Admitting: Podiatry

## 2016-05-31 ENCOUNTER — Non-Acute Institutional Stay (SKILLED_NURSING_FACILITY): Payer: Medicare Other | Admitting: Internal Medicine

## 2016-05-31 ENCOUNTER — Encounter: Payer: Self-pay | Admitting: Internal Medicine

## 2016-05-31 DIAGNOSIS — Z9181 History of falling: Secondary | ICD-10-CM

## 2016-05-31 DIAGNOSIS — R030 Elevated blood-pressure reading, without diagnosis of hypertension: Secondary | ICD-10-CM

## 2016-05-31 DIAGNOSIS — M542 Cervicalgia: Secondary | ICD-10-CM | POA: Diagnosis not present

## 2016-05-31 DIAGNOSIS — W19XXXA Unspecified fall, initial encounter: Secondary | ICD-10-CM | POA: Diagnosis not present

## 2016-05-31 DIAGNOSIS — R000 Tachycardia, unspecified: Secondary | ICD-10-CM | POA: Diagnosis not present

## 2016-06-01 DIAGNOSIS — R278 Other lack of coordination: Secondary | ICD-10-CM | POA: Diagnosis not present

## 2016-06-01 DIAGNOSIS — M6281 Muscle weakness (generalized): Secondary | ICD-10-CM | POA: Diagnosis not present

## 2016-06-09 ENCOUNTER — Encounter: Payer: Self-pay | Admitting: Adult Health

## 2016-06-09 ENCOUNTER — Non-Acute Institutional Stay (SKILLED_NURSING_FACILITY): Payer: Medicare Other | Admitting: Adult Health

## 2016-06-09 DIAGNOSIS — D638 Anemia in other chronic diseases classified elsewhere: Secondary | ICD-10-CM | POA: Diagnosis not present

## 2016-06-09 DIAGNOSIS — J309 Allergic rhinitis, unspecified: Secondary | ICD-10-CM | POA: Diagnosis not present

## 2016-06-09 DIAGNOSIS — E785 Hyperlipidemia, unspecified: Secondary | ICD-10-CM | POA: Diagnosis not present

## 2016-06-09 DIAGNOSIS — G4700 Insomnia, unspecified: Secondary | ICD-10-CM | POA: Diagnosis not present

## 2016-06-09 DIAGNOSIS — E559 Vitamin D deficiency, unspecified: Secondary | ICD-10-CM

## 2016-06-09 DIAGNOSIS — Z8673 Personal history of transient ischemic attack (TIA), and cerebral infarction without residual deficits: Secondary | ICD-10-CM | POA: Diagnosis not present

## 2016-06-09 NOTE — Progress Notes (Signed)
Patient ID: [name], female   DOB: May 19, 1924, 83 y.o.   MRN: [id number]    DATE:  06/09/2016   MRN:  [id number]  BIRTHDAY: Sep 17, 1924  Facility:  Nursing Home Location:  [facility]  Nursing Home Room Number: 203-B  LEVEL OF CARE:  SNF [phone])  Contact Information    Name Relation Home Work Mobile   [place],[name] Daughter [phone]  [phone]   Plummer,Kim Daughter [phone]   Saban,Kit Daughter [phone]  [phone]       Code Status History    Date Active Date Inactive Code Status Order ID Comments User Context   11/26/2015 12:26 AM 11/26/2015  9:47 PM DNR [id number]  Althia Forts, MD Inpatient   11/24/2015 12:38 AM 11/24/2015  1:59 PM DNR [id number]  Valentino Nose, MD Inpatient   08/07/2015  4:52 AM 08/12/2015  8:32 PM Partial Code [id number]  Alberteen Sam, MD Inpatient   12/18/2014  7:17 PM 12/21/2014  2:11 PM Partial Code [id number]  Meredith Pel, NP Inpatient   10/31/2014  3:32 AM 10/31/2014  7:24 PM Full Code [id number]  Ron Parker, MD Inpatient   08/28/2013 12:58 AM 08/30/2013  4:40 PM Full Code [id number]  Jeralyn Bennett, MD Inpatient   03/09/2013  6:50 PM 03/12/2013  6:27 PM Full Code [id number]  Clydia Llano, MD Inpatient   02/15/2013  6:26 PM 03/03/2013 12:40 PM Full Code [id number]  Charlton Amor, PA-C Inpatient   02/12/2013 12:50 PM 02/15/2013  6:26 PM Full Code [id number]  Penny Pia, MD Inpatient   02/05/2013  7:35 PM 02/07/2013  8:48 PM Full Code [id number]  Jeralyn Bennett, MD Inpatient   08/01/2012  5:49 PM 08/03/2012  2:28 PM Full Code [id number]  Stephani Police, PA-C Inpatient    Questions for Most Recent Historical Code Status (Order [id number])    Question Answer Comment   In the event of cardiac or respiratory ARREST Do not call a "code blue"    In the event of cardiac or respiratory ARREST Do not perform Intubation, CPR, defibrillation or ACLS    In the event of cardiac or respiratory ARREST Use  medication by any route, position, wound care, and other measures to relive pain and suffering. May use oxygen, suction and manual treatment of airway obstruction as needed for comfort.         Advance Directive Documentation   Flowsheet Row Most Recent Value  Type of Advance Directive  Out of facility DNR (pink MOST or yellow form)  Pre-existing out of facility DNR order (yellow form or pink MOST form)  No data  "MOST" Form in Place?  No data       Chief Complaint  Patient presents with  . Medical Management of Chronic Issues    HISTORY OF PRESENT ILLNESS:  The patient is an 83-YO female who is being seen for a routine visit. She is a long-term resident of [facility]. She was recently treated for left great toe cellulitis with Doxycycline. EKG recently done showed sinus rhythm.   PAST MEDICAL HISTORY:  Past Medical History:  Diagnosis Date  . Acute cystitis without hematuria   . Acute encephalopathy   . Arthritis   . Atrial fibrillation ([facility])    on Eliquis Rx  . Atrial flutter ([facility]) January, 2012  . Chronic anticoagulation   . Colon polyp   . Constipation   . Hearing difficulty   . Hemorrhoids   . High cholesterol   .  HLD (hyperlipidemia)   . Hypertension   . Left leg weakness 02/05/2013  . Left-sided weakness   . Malnutrition ([facility])   . Migraine   . Mild cognitive impairment with memory loss   . Osteoporosis   . Stroke ([facility]) 08/02/12    right lenticular nucleus infarct  . TIA (transient ischemic attack)   . Torus palatinus      CURRENT MEDICATIONS: Reviewed  Patient's Medications  New Prescriptions   No medications on file  Previous Medications   ACETAMINOPHEN (CHLORASEPTIC SORE THROAT PO)    Take 2 sprays by mouth every 4 (four) hours as needed (sore throat). x2 weeks   ACETAMINOPHEN (TYLENOL) 325 MG TABLET    Take 650 mg by mouth every 4 (four) hours as needed for fever. For temp greater than 101.5 per SO   ACETAMINOPHEN (TYLENOL) 500 MG TABLET    Take  500 mg by mouth every 8 (eight) hours as needed.   ALBUTEROL (PROVENTIL) (2.5 MG/3ML) 0.083% NEBULIZER SOLUTION    Take 2.5 mg by nebulization 3 (three) times daily as needed for wheezing or shortness of breath.   APIXABAN (ELIQUIS) 5 MG TABS TABLET    Take 1 tablet (5 mg total) by mouth 2 (two) times daily.   ATORVASTATIN (LIPITOR) 40 MG TABLET    Take 1 tablet (40 mg total) by mouth daily at 6 PM.   B COMPLEX VITAMINS TABLET    Take 1 tablet by mouth 2 (two) times daily.    CALCIUM CARB-CHOLECALCIFEROL (CALCIUM 600 + D) 600-200 MG-UNIT TABS    Take 1 tablet by mouth 2 (two) times daily.   CETIRIZINE (ZYRTEC) 10 MG TABLET    Take 10 mg by mouth at bedtime.    CHOLECALCIFEROL (VITAMIN D3) 2000 UNITS TABS    Take 1 tablet by mouth daily.   CRANBERRY 250 MG CAPS    Take 2 capsules by mouth daily. Take 2 capsules to = 500 mg po qd   DIVALPROEX (DEPAKOTE) 125 MG DR TABLET    Take 3 tablets (375 mg total) by mouth 2 (two) times daily.   HYDROCORTISONE (ANUSOL-HC) 2.5 % RECTAL CREAM    Place 1 application rectally as needed for hemorrhoids or itching.   HYDROCORTISONE (ANUSOL-HC) 25 MG SUPPOSITORY    Place 25 mg rectally as needed for hemorrhoids or itching.   MELATONIN 3 MG TABS    Take 1 tablet by mouth at bedtime    MULTIPLE VITAMINS-MINERALS (MULTIVITAMIN WITH MINERALS) TABLET    Take 1 tablet by mouth daily.   OLOPATADINE (PATANOL) 0.1 % OPHTHALMIC SOLUTION    PLACE 1 DROP INTO BOTH EYES 2 (TWO) TIMES DAILY.   ONDANSETRON (ZOFRAN) 4 MG TABLET    Take 1 tablet (4 mg total) by mouth every 6 (six) hours as needed for nausea.   PROTEIN (PROCEL) POWD    Take 1 scoop by mouth 2 (two) times daily.  Modified Medications   No medications on file  Discontinued Medications   DOXYCYCLINE (VIBRAMYCIN) 100 MG CAPSULE    Take 100 mg by mouth 2 (two) times daily. Stop date 06/01/16   SACCHAROMYCES BOULARDII (FLORASTOR) 250 MG CAPSULE    Take 250 mg by mouth 2 (two) times daily. Stop date 06/06/16     Allergies    Allergen Reactions  . Aricept [Donepezil Hcl] Hypertension    With N/V  . Tramadol Hcl Other (See Comments)     lethargy, nausea  . Alendronate Sodium Rash and Other (See Comments)  puffy eyes     REVIEW OF SYSTEMS:  GENERAL: no change in appetite, no fatigue, no weight changes, no fever, chills or weakness EYES: Denies change in vision, dry eyes, eye pain, itching or discharge EARS: Denies change in hearing, ringing in ears, or earache NOSE: Denies nasal congestion or epistaxis MOUTH and THROAT: Denies oral discomfort, gingival pain or bleeding, pain from teeth or hoarseness   RESPIRATORY: no cough, SOB, DOE, wheezing, hemoptysis CARDIAC: no chest pain, edema or palpitations GI: no abdominal pain, diarrhea, constipation, heart burn, nausea or vomiting GU: Denies dysuria, frequency, hematuria, incontinence, or discharge PSYCHIATRIC: Denies feeling of depression or anxiety. No report of hallucinations, insomnia, paranoia, or agitation    PHYSICAL EXAMINATION  GENERAL APPEARANCE: Well nourished. In no acute distress. Normal body habitus SKIN:  Skin is warm and dry.  HEAD: Normal in size and contour. No evidence of trauma EYES: Lids open and close normally. No blepharitis, entropion or ectropion. PERRL. Conjunctivae are clear and sclerae are white. Lenses are without opacity EARS: Pinnae are normal. Patient hears normal voice tunes of the examiner MOUTH and THROAT: Lips with dried/brown lesions. Oral mucosa is moist and without lesions. Tongue is normal in shape, size, and color and without lesions NECK: supple, trachea midline, no neck masses, no thyroid tenderness, no thyromegaly LYMPHATICS: no LAN in the neck, no supraclavicular LAN RESPIRATORY: breathing is even & unlabored, BS CTAB CARDIAC: RRR, no murmur,no extra heart sounds, no edema GI: abdomen soft, normal BS, no masses, no tenderness, no hepatomegaly, no splenomegaly EXTREMITIES:  Able to move 4 extremities , left  hemiparesis, left hand has splint PSYCHIATRIC: Alert to person, disoriented to time and place.  Affect and behavior are appropriate    LABS/RADIOLOGY: Labs reviewed: Basic Metabolic Panel:  Recent Labs  08/08/15 0539  08/09/15 0633  09/12/15 1250  11/24/15 0416 11/25/15 0523 11/26/15 0420  01/23/16 04/01/16 04/06/16 04/28/16  NA 148*  < > 144  < > 142  < > 143 141 139  < > 140 142 141 144  K 4.0  < > 4.1  < > 4.2  < > 3.8 4.3 4.4  < > 4.2 4.6  --  4.2  CL 108  < > 109  < > 104  < > 111 110 108  --   --   --   --   --   CO2 29  < > 25  < > 31  < > '25 26 25  '$ --   --   --   --   --   GLUCOSE 95  < > 102*  < > 89  < > 96 101* 114*  --   --   --   --   --   BUN 9  < > 10  < > 15  < > '15 10 15  '$ < > '14 16 16 12  '$ CREATININE 0.74  < > 0.76  < > 0.72  < > 0.82 0.60 0.67  < > 0.4* 0.6 0.4* 0.5  CALCIUM 8.7*  < > 8.3*  < > 9.1  < > 8.3* 8.3* 8.5*  --   --   --   --   --   MG 2.1  --  2.0  --  1.9  --   --   --   --   --   --   --   --   --   < > = values in this interval not  displayed. Liver Function Tests:  Recent Labs  08/08/15 0539 09/12/15 1250 11/23/15 2055 03/15/16  AST '23 21 24 25  '$ ALT 12* 11* 13* 19  ALKPHOS 43 49 61 112  BILITOT 0.6 0.4 0.4  --   PROT 6.0* 6.4* 6.0*  --   ALBUMIN 3.3* 3.4* 3.3*  --     Recent Labs  06/23/15 1643 08/08/15 1303  AMMONIA 21 16   CBC:  Recent Labs  11/23/15 2055  11/25/15 0523 11/26/15 0420  12/31/15 01/23/16 04/01/16 04/06/16 04/28/16  WBC 4.9  --  5.4 8.2  < > 7.6 5.9 5.4 5.0 6.9  NEUTROABS 2.6  --   --   --   < > 5 4  --   --  5  HGB 12.3  < > 11.9* 12.3  < > 11.5* 10.6* 11.6* 11.8* 11.6*  HCT 38.9  < > 36.9 37.9  < > 36 33* 37 36 37  MCV 96.5  --  93.7 94.0  --   --   --   --   --   --   PLT 183  --  186 185  < > 240 209 226 297 176  < > = values in this interval not displayed.  Lipid Panel:  Recent Labs  08/07/15 0551 11/24/15 0417 03/15/16  HDL 47 48 49   Cardiac Enzymes:  Recent Labs  07/31/15 1115  09/12/15 1250  TROPONINI <0.03 <0.03   CBG:  Recent Labs  06/23/15 1715 08/07/15 0202 11/23/15 2111  GLUCAP 79 102* 154*     ASSESSMENT/PLAN:  Hx of CVA - Continue Eliquis 5 mg 1 tab by mouth twice a day, Lipitor 40 mg 1 tab by mouth daily; followed-up with Dr. Leonie Man recently on 04/12/16  Allergic rhinitis - continue Zyrtec 10 mg 1 tab by mouth daily at bedtime  Insomnia - continue melatonin 3 mg 1 tab by mouth daily at bedtime  Vitamin D deficiency - continue vitamin D3 2000 units 1 tab by mouth daily  Hyperlipidemia - continue Lipitor 40 mg 1 tab by mouth daily  Anemia of chronic disease - stable Lab Results  Component Value Date   HGB 11.6 (A) 04/28/2016       Goals of care:  Long-term care    [place] Delauder C. [place] - NP [employer] [phone]

## 2016-06-26 DIAGNOSIS — R569 Unspecified convulsions: Secondary | ICD-10-CM

## 2016-06-26 HISTORY — DX: Unspecified convulsions: R56.9

## 2016-06-28 ENCOUNTER — Encounter: Payer: Self-pay | Admitting: Adult Health

## 2016-06-28 ENCOUNTER — Telehealth: Payer: Self-pay | Admitting: Nurse Practitioner

## 2016-06-28 ENCOUNTER — Non-Acute Institutional Stay (SKILLED_NURSING_FACILITY): Payer: Medicare Other | Admitting: Adult Health

## 2016-06-28 DIAGNOSIS — Z8673 Personal history of transient ischemic attack (TIA), and cerebral infarction without residual deficits: Secondary | ICD-10-CM

## 2016-06-28 DIAGNOSIS — G40909 Epilepsy, unspecified, not intractable, without status epilepticus: Secondary | ICD-10-CM | POA: Diagnosis not present

## 2016-06-28 DIAGNOSIS — I480 Paroxysmal atrial fibrillation: Secondary | ICD-10-CM | POA: Diagnosis not present

## 2016-06-28 NOTE — Telephone Encounter (Signed)
[name] with [facility] is calling. The patient had a seizure last Saturday 06-26-16  and [name] is concerned that the patient should be seen sooner than her appointment which is scheduled with Hoyle Sauer on 07-13-16. Please call and discuss.

## 2016-06-28 NOTE — Progress Notes (Signed)
DATE:  06/28/2016   MRN:  [id number]  BIRTHDAY: [birth year]  Facility:  Nursing Home Location:  [facility]  Nursing Home Room Number: 203-B  LEVEL OF CARE:  SNF [phone])  Contact Information    Name Relation Home Work Mobile   Vavra,[name] Daughter [phone]  [phone]   Plummer,Kim Daughter [phone]   Shamblin,Kit Daughter [phone]  [phone]       Code Status History    Date Active Date Inactive Code Status Order ID Comments User Context   11/26/2015 12:26 AM 11/26/2015  9:47 PM DNR [id number]  Althia Forts, MD Inpatient   11/24/2015 12:38 AM 11/24/2015  1:59 PM DNR [id number]  Valentino Nose, MD Inpatient   08/07/2015  4:52 AM 08/12/2015  8:32 PM Partial Code [id number]  Alberteen Sam, MD Inpatient   12/18/2014  7:17 PM 12/21/2014  2:11 PM Partial Code [id number]  Meredith Pel, NP Inpatient   10/31/2014  3:32 AM 10/31/2014  7:24 PM Full Code [id number]  Ron Parker, MD Inpatient   08/28/2013 12:58 AM 08/30/2013  4:40 PM Full Code [id number]  Jeralyn Bennett, MD Inpatient   03/09/2013  6:50 PM 03/12/2013  6:27 PM Full Code [id number]  Clydia Llano, MD Inpatient   02/15/2013  6:26 PM 03/03/2013 12:40 PM Full Code [id number]  Charlton Amor, PA-C Inpatient   02/12/2013 12:50 PM 02/15/2013  6:26 PM Full Code [id number]  Penny Pia, MD Inpatient   02/05/2013  7:35 PM 02/07/2013  8:48 PM Full Code [id number]  Jeralyn Bennett, MD Inpatient   08/01/2012  5:49 PM 08/03/2012  2:28 PM Full Code [id number]  Stephani Police, PA-C Inpatient    Questions for Most Recent Historical Code Status (Order [id number])    Question Answer Comment   In the event of cardiac or respiratory ARREST Do not call a "code blue"    In the event of cardiac or respiratory ARREST Do not perform Intubation, CPR, defibrillation or ACLS    In the event of cardiac or respiratory ARREST Use medication by any route, position, wound care, and other measures to relive pain and  suffering. May use oxygen, suction and manual treatment of airway obstruction as needed for comfort.         Advance Directive Documentation     Most Recent Value  Type of Advance Directive  Out of facility DNR (pink MOST or yellow form)  Pre-existing out of facility DNR order (yellow form or pink MOST form)  Pink MOST form placed in chart (order not valid for inpatient use)  "MOST" Form in Place?  -       Chief Complaint  Patient presents with  . Acute Visit    Seizure    HISTORY OF PRESENT ILLNESS:  This is an 49-YO female seen for an acute visit due to a seizure episode over the weekend. Daughter was at bedside when seen in the room. Daughter reported that she was at bedside when the incident happened. Daughter said that patient was staring at something for a long time and then started to shake. She is currently on Depakote 375 mg BID and follows-up with neurology. Daughter said that patient has a current appointment with neurology on 07/13/16. Patient is seen lying down on her bed and does not remember having any seizure.   PAST MEDICAL HISTORY:  Past Medical History:  Diagnosis Date  . Acute cystitis without hematuria   . Acute encephalopathy   . Arthritis   .  Atrial fibrillation (HCC)    on Eliquis Rx  . Atrial flutter (HCC) January, 2012  . Chronic anticoagulation   . Colon polyp   . Constipation   . Hearing difficulty   . Hemorrhoids   . High cholesterol   . HLD (hyperlipidemia)   . Hypertension   . Left leg weakness 02/05/2013  . Left-sided weakness   . Malnutrition (HCC)   . Migraine   . Mild cognitive impairment with memory loss   . Osteoporosis   . Stroke (HCC) 08/02/12    right lenticular nucleus infarct  . TIA (transient ischemic attack)   . Torus palatinus      CURRENT MEDICATIONS: Reviewed  Patient's Medications  New Prescriptions   No medications on file  Previous Medications   ACETAMINOPHEN (CHLORASEPTIC SORE THROAT PO)    Take 2 sprays by mouth  every 4 (four) hours as needed (sore throat). x2 weeks   ACETAMINOPHEN (TYLENOL) 325 MG TABLET    Take 650 mg by mouth every 4 (four) hours as needed for fever. For temp greater than 101.5 per SO   ACETAMINOPHEN (TYLENOL) 500 MG TABLET    Take 500 mg by mouth every 8 (eight) hours as needed.   ALBUTEROL (PROVENTIL) (2.5 MG/3ML) 0.083% NEBULIZER SOLUTION    Take 2.5 mg by nebulization 3 (three) times daily as needed for wheezing or shortness of breath.   APIXABAN (ELIQUIS) 5 MG TABS TABLET    Take 1 tablet (5 mg total) by mouth 2 (two) times daily.   ATORVASTATIN (LIPITOR) 40 MG TABLET    Take 1 tablet (40 mg total) by mouth daily at 6 PM.   B COMPLEX VITAMINS TABLET    Take 1 tablet by mouth 2 (two) times daily.    CALCIUM CARB-CHOLECALCIFEROL (CALCIUM 600 + D) 600-200 MG-UNIT TABS    Take 1 tablet by mouth 2 (two) times daily.   CETIRIZINE (ZYRTEC) 10 MG TABLET    Take 10 mg by mouth at bedtime.    CHOLECALCIFEROL (VITAMIN D3) 2000 UNITS TABS    Take 1 tablet by mouth daily.   CRANBERRY 250 MG CAPS    Take 2 capsules by mouth daily. Take 2 capsules to = 500 mg po qd   DIVALPROEX (DEPAKOTE) 125 MG DR TABLET    Take 3 tablets (375 mg total) by mouth 2 (two) times daily.   HYDROCORTISONE (ANUSOL-HC) 2.5 % RECTAL CREAM    Place 1 application rectally as needed for hemorrhoids or itching.   HYDROCORTISONE (ANUSOL-HC) 25 MG SUPPOSITORY    Place 25 mg rectally as needed for hemorrhoids or itching.   MELATONIN 3 MG TABS    Take 1 tablet by mouth at bedtime    MULTIPLE VITAMINS-MINERALS (MULTIVITAMIN WITH MINERALS) TABLET    Take 1 tablet by mouth daily.   OLOPATADINE (PATANOL) 0.1 % OPHTHALMIC SOLUTION    PLACE 1 DROP INTO BOTH EYES 2 (TWO) TIMES DAILY.   ONDANSETRON (ZOFRAN) 4 MG TABLET    Take 1 tablet (4 mg total) by mouth every 6 (six) hours as needed for nausea.   PROTEIN (PROCEL) POWD    Take 1 scoop by mouth 2 (two) times daily.  Modified Medications   No medications on file  Discontinued  Medications   No medications on file     Allergies  Allergen Reactions  . Aricept [Donepezil Hcl] Hypertension    With N/V  . Tramadol Hcl Other (See Comments)     lethargy, nausea  . Alendronate  Sodium Rash and Other (See Comments)    puffy eyes     REVIEW OF SYSTEMS:  GENERAL: no change in appetite, no fatigue, no weight changes, no fever, chills or weakness EYES: Denies change in vision, dry eyes, eye pain, itching or discharge EARS: Denies change in hearing, ringing in ears, or earache NOSE: Denies nasal congestion or epistaxis MOUTH and THROAT: Denies oral discomfort, gingival pain or bleeding, pain from teeth or hoarseness   RESPIRATORY: no cough, SOB, DOE, wheezing, hemoptysis CARDIAC: no chest pain, edema or palpitations GI: no abdominal pain, diarrhea, constipation, heart burn, nausea or vomiting GU: Denies dysuria, frequency, hematuria, incontinence, or discharge NEUROLOGICAL: Denies dizziness, syncope, numbness, or headache; had a recent  seizure PSYCHIATRIC: Denies feeling of depression or anxiety. No report of hallucinations, insomnia, paranoia, or agitation    PHYSICAL EXAMINATION  GENERAL APPEARANCE:  In no acute distress.  SKIN:  Skin is warm and dry.  HEAD: Normal in size and contour. No evidence of trauma EYES: Lids open and close normally. No blepharitis, entropion or ectropion. PERRL. Conjunctivae are clear and sclerae are white. Lenses are without opacity EARS: Pinnae are normal. Patient hears normal voice tunes of the examiner MOUTH and THROAT: Lips are without lesions. Oral mucosa is moist and without lesions. Tongue is normal in shape, size, and color and without lesions NECK: supple, trachea midline, no neck masses, no thyroid tenderness, no thyromegaly LYMPHATICS: no LAN in the neck, no supraclavicular LAN RESPIRATORY: breathing is even & unlabored, BS CTAB CARDIAC: RRR, no murmur,no extra heart sounds, no edema GI: abdomen soft, normal BS, no  masses, no tenderness, no hepatomegaly, no splenomegaly EXTREMITIES:  Able to move X 4 extremities, left hemiparesis, left hand has splint PSYCHIATRIC: Alert to self, disoriented to time and place. Affect and behavior are appropriate  LABS/RADIOLOGY: Labs reviewed: Basic Metabolic Panel:  Recent Labs  08/08/15 0539  08/09/15 0633  09/12/15 1250  11/24/15 0416 11/25/15 0523 11/26/15 0420  01/23/16 04/01/16 04/06/16 04/28/16  NA 148*  < > 144  < > 142  < > 143 141 139  < > 140 142 141 144  K 4.0  < > 4.1  < > 4.2  < > 3.8 4.3 4.4  < > 4.2 4.6  --  4.2  CL 108  < > 109  < > 104  < > 111 110 108  --   --   --   --   --   CO2 29  < > 25  < > 31  < > '25 26 25  '$ --   --   --   --   --   GLUCOSE 95  < > 102*  < > 89  < > 96 101* 114*  --   --   --   --   --   BUN 9  < > 10  < > 15  < > '15 10 15  '$ < > '14 16 16 12  '$ CREATININE 0.74  < > 0.76  < > 0.72  < > 0.82 0.60 0.67  < > 0.4* 0.6 0.4* 0.5  CALCIUM 8.7*  < > 8.3*  < > 9.1  < > 8.3* 8.3* 8.5*  --   --   --   --   --   MG 2.1  --  2.0  --  1.9  --   --   --   --   --   --   --   --   --   < > =  values in this interval not displayed. Liver Function Tests:  Recent Labs  08/08/15 0539 09/12/15 1250 11/23/15 2055 03/15/16  AST '23 21 24 25  '$ ALT 12* 11* 13* 19  ALKPHOS 43 49 61 112  BILITOT 0.6 0.4 0.4  --   PROT 6.0* 6.4* 6.0*  --   ALBUMIN 3.3* 3.4* 3.3*  --     Recent Labs  08/08/15 1303  AMMONIA 16   CBC:  Recent Labs  11/23/15 2055  11/25/15 0523 11/26/15 0420  12/31/15 01/23/16 04/01/16 04/06/16 04/28/16  WBC 4.9  --  5.4 8.2  < > 7.6 5.9 5.4 5.0 6.9  NEUTROABS 2.6  --   --   --   < > 5 4  --   --  5  HGB 12.3  < > 11.9* 12.3  < > 11.5* 10.6* 11.6* 11.8* 11.6*  HCT 38.9  < > 36.9 37.9  < > 36 33* 37 36 37  MCV 96.5  --  93.7 94.0  --   --   --   --   --   --   PLT 183  --  186 185  < > 240 209 226 297 176  < > = values in this interval not displayed. Lipid Panel:  Recent Labs  08/07/15 0551 11/24/15 0417 03/15/16   HDL 47 48 49   Cardiac Enzymes:  Recent Labs  07/31/15 1115 09/12/15 1250  TROPONINI <0.03 <0.03   CBG:  Recent Labs  08/07/15 0202 11/23/15 2111  GLUCAP 102* 154*      ASSESSMENT/PLAN:  Seizure - continue Depakote 125 mg give 3 tabs = 375 mg PO BID; notify neurologist regarding incident and schedule an earlier appointment  Hx of CVA - stable; continue Eliquis 5 mg 1 tab PO BID, Lipitor 40 mg 1 tab PO Q 6 PM  Paroxysmal atrial fibrillation - rate-controlled; continue Eliquis 5 mg PO BID      Mykah Bellomo C. [place] - NP    [employer] [phone]

## 2016-06-29 DIAGNOSIS — Z79899 Other long term (current) drug therapy: Secondary | ICD-10-CM | POA: Diagnosis not present

## 2016-06-29 NOTE — Telephone Encounter (Signed)
Spoke with Janett Billow who stated patient was not taken to ED when she had seizure on 06/26/16. Per Minerva Fester' note on 06/09/16 patient is taking correct dose of Depakote.  Rescheduled FU from 4/17 to 4/4 at Cecilia request. Advised patient needs to arrive 30 min early. She verbalized understanding.

## 2016-06-30 ENCOUNTER — Encounter: Payer: Self-pay | Admitting: Nurse Practitioner

## 2016-06-30 ENCOUNTER — Ambulatory Visit (INDEPENDENT_AMBULATORY_CARE_PROVIDER_SITE_OTHER): Payer: Medicare Other | Admitting: Nurse Practitioner

## 2016-06-30 VITALS — BP 114/64 | HR 96 | Wt 138.4 lb

## 2016-06-30 DIAGNOSIS — E785 Hyperlipidemia, unspecified: Secondary | ICD-10-CM | POA: Diagnosis not present

## 2016-06-30 DIAGNOSIS — I10 Essential (primary) hypertension: Secondary | ICD-10-CM

## 2016-06-30 DIAGNOSIS — G40209 Localization-related (focal) (partial) symptomatic epilepsy and epileptic syndromes with complex partial seizures, not intractable, without status epilepticus: Secondary | ICD-10-CM | POA: Diagnosis not present

## 2016-06-30 DIAGNOSIS — I480 Paroxysmal atrial fibrillation: Secondary | ICD-10-CM

## 2016-06-30 DIAGNOSIS — I639 Cerebral infarction, unspecified: Secondary | ICD-10-CM | POA: Diagnosis not present

## 2016-06-30 DIAGNOSIS — Z79899 Other long term (current) drug therapy: Secondary | ICD-10-CM | POA: Diagnosis not present

## 2016-06-30 NOTE — Progress Notes (Signed)
[facility]  PATIENT: [name] DOB: 1924/04/01   REASON FOR VISIT: follow-up for history of stroke recent seizure HISTORY FROM: daughters Kit and [name]    HISTORY OF PRESENT ILLNESS:88 year Caucasian lady seen for first office follow for the following hospital admission on 03/09/13 for left hemiparesis. She has remote history of right brain subcortical infarct in May 2014 felt to be secondary to atrial fibrillation has been on anticoagulation with cerebral since then. She was admitted multiple times with worsening of hemiparesis in the setting of infection or dehydration. She had an MRI in December 2014 which did not reveal any new right brain stroke but showed and he of restricted diffusion which is patchy in the left parietal region. At that time it was not clear whether this was a stroke or not however the patient's daughter was informed today that she had indeed had a fall a month prior in November 2014 that she had sustained scalp hematoma in the left temporal region and had been seen in the ER and a solitary CT scan of the head was unremarkable and she was sent back to the nursing home. In retrospect the MRI diffusion abnormality in the left parietal region in December 2014 they represent a delayed hemorrhage contusion which may have sustained from a fall in November 2014. It was planned to change as there are 2 to a liquids but the family never made the switch as they were unclear as to what the co-pay for a liquids would cost him. The patient seems to be tolerating that well without any further increased bleeding or bruising. She suffered from a bad cold for 2 weeks and had a minor nasal bleed which stopped. She is currently living at home and getting physical and occupational therapy. She is able to walk with a walker fairly well. She is carefully washed over by 3 daughters. She has had some mild memory difficulties but these are not progressive. Update 11/15/2013 : She  returns for followup of her last visit in March 2015. She did benefit from outpatient physical and occupational therapy but was readmitted in May and was found to have a right corpus callosum infarct as well as bladder infection. I have personally reviewed imaging studies and hospital workup. She was on Xarelto which was switched to eliquis 2.27m twice daily. [facility] and occupational therapy recently. She can walk with a walker but does lean forward sent to the right and requires one-person assist. She still has some residual left-sided weakness from her recent stroke. She is impulsive and does not always call for help. The family feels she is unsafe to walk by herself. The patient is quite frustrated about the situation. Update 07/25/2014 : She returns for follow-up after last visit 8 months ago. She is accompanied by 3 of her daughters who reports that they've noticed some cognitive decline and memory loss since last visit. Patient has been in and out of the hospital this last year multiple times with strokes as well as dehydration and bladder infections. She is presently living at home and has 24-hour care. She spends most of the time in a wheelchair and does not have much social interaction and increases to go out. She is able to toe walk with one-person assist. She has some home therapy every day which makes her walk a little bit. She is tolerating liquids without bleeding bruising or other  side effects. She has not had headaches for quite some time and no definite documented seizures last couple of years. She is currently on valproic acid 375 twice daily. Update 10/29/2014 : She returns for follow-up after last visit 4 months ago. She is accompanied by her daughter who states that her memory and cognitive difficulties are about the same but in fact on Mini-Mental status testing today she shows a decline with score of 17/30.  Clock drawing is only 1/4 in am and naming is only 6. The patient has had complaints of nocturia and has to get up several times at night and does not sleep well at night. She has been to a urologist as well as family physician but the testing for infection has been negative. The patient is not very active during the day and does not engage and interaction. There have been no no issues with agitation, delusions or hallucinations. The family is willing to try Aricept but have concerns about possible side effects. They also want to try some natural supplements to help her sleep but her reluctant to take any sleeping pills. Update 02/06/2015 : She returns for f/u after last visit 3 months ago. She was admitted to [facility] on 12/18/14 with worsening weakness and gait difficulties and was found to have UTI and dehydration and was deconditioned.MRI showed no acute infarct. She was transferred to SNF for rehab and has slowly improved but is not participating as much with therapy and barely walks and needs 2 person assist. She at home now with her daughters but does not walk as much as she used to prior to her recent hospitalization.She tried aricept in August but did not tolerate it due to GI side effects. Update 10/16/2015 :  She returns for follow-up after last visit 8 months ago. She is accompanied by her 2 daughters. Patient was recently hospitalized in 08/07/15 with worsening of left-sided symptoms. MRI scan of the brain did not show an acute infarct. Respiratory deconditioned following recurrent bouts of recent bladder infections. Patient has now been started on cranberry juice and family feels that may be helping. She was seen in the ER 2 weeks ago and had a positive blood culture. She had been on Keflex she was given 2 doses of IV antibiotics and blood culture repeated the next day was negative and oral antibiotics were continued. She continues to live at home and has 24-hour care provided by her daughters. She is able  to walk with a walker but needs close one-person assist. She continues to have persistent left-sided weakness. She is on eliquis which is tolerating well without bleeding or bruising. She does have an upcoming appointment with a urologist to discuss her increased frequency of urination. Update 04/12/2016 PS: She returns for follow-up after last visit 6 months ago. She is accompanied by 2 of her daughters. She was recently hospitalized with another stroke unfortunately. She presented on 11/23/15 with the left frontal headache and right hemiparesis. CT scan of the head showed no acute abnormality but MRI scan which I have personally reviewed did confirm a left anterior cerebral artery acute infarct. Old infarcts are noted in the right basal ganglia, left parietal lobe and right cerebellum. EEG showed left temporal focal irritability but no definite seizure activity. LDL cholesterol is elevated at 116 mg percent. Echocardiogram and Dopplers were not repeated and they had previously been done in May 2017. Patient had previously been on Xarelto and had been switched to eliquis for anticoagulation for chronic atrial   fibrillation. After prolonged discussion with patient and daughters about lack of data about head-to-head comparison amongst the different newer anticoagulants in stroke prevention it was decided to keep her on eliquis and not switch. She was supposed to be on Lipitor but it had been stopped and she was put back on it. Patient is currently living at [facility]. She is getting physical therapy. She is able to walk with physical therapy with 2 person assist and standard little bit and take a few steps. Patient has had bouts of urinary tract infection with Klebsiella recently and in recent months has had cellulitis and pneumonia requiring antibiotic courses. She was on Depakote 250 twice daily. Family is concerned that in recent weeks she's had 2 spells of transient staring unresponsively while working with the  therapist. She feels tired after these episodes which last barely a few minutes. There has been no witnessed mouth frothing, twitching, clonic or tonic activity noted. She had an EEG done in the hospital which had shown some left temporal slowing and hence Depakote was started in the last admission. UPDATE 04/04/2018CM . Ms. Myers , 89-year-old female returns for follow-up with her daughters She currently resides at [facility]. She was hospitalized for another stroke in August 2017. She was switched from Xarelto to eliquis at that time.  She is also on Lipitor for hyperlipidemia. Due to some periods of confusion and staring spells she was started on Depakote250 twice daily.  EEG performed  05/15/2016 ,suboptimal study due to excess patient movement , no definite seizure activity seen.  Her Depakote was increased 375 twice daily  By Dr. Sethi. She had another seizure on 06/26/16  that according to the daughters lasted approximately 6 months.  Depakote level drawn at the facility on 06/29/16 was 47.5.  She is continuing to get some physical therapy to her hand. She is wheelchair-bound. She returns or reevaluation  REVIEW OF SYSTEMS: Full 14 system review of systems performed and notable only for those listed, all others are neg:  Constitutional: neg  Cardiovascular: neg Ear/Nose/Throat: neg  Skin: neg Eyes: neg Respiratory: neg Gastroitestinal:  Urinary inconinence  Hematology/Lymphatic: neg  Endocrine: neg Musculoskeletal:neg Allergy/Immunology: neg Neurological:  Seizure disorder Psychiatric:  Decreased concentration Sleep : neg   ALLERGIES: Allergies  Allergen Reactions  . Aricept [Donepezil Hcl] Hypertension    With N/V  . Tramadol Hcl Other (See Comments)     lethargy, nausea  . Alendronate Sodium Rash and Other (See Comments)    puffy eyes    HOME MEDICATIONS: Outpatient Medications Prior to Visit  Medication Sig Dispense Refill  . Acetaminophen (CHLORASEPTIC SORE THROAT PO) Take  2 sprays by mouth every 4 (four) hours as needed (sore throat). x2 weeks    . acetaminophen (TYLENOL) 325 MG tablet Take 650 mg by mouth every 4 (four) hours as needed for fever. For temp greater than 101.5 per SO    . acetaminophen (TYLENOL) 500 MG tablet Take 500 mg by mouth every 8 (eight) hours as needed.    . apixaban (ELIQUIS) 5 MG TABS tablet Take 1 tablet (5 mg total) by mouth 2 (two) times daily. 60 tablet 0  . atorvastatin (LIPITOR) 40 MG tablet Take 1 tablet (40 mg total) by mouth daily at 6 PM. 30 tablet 0  . b complex vitamins tablet Take 1 tablet by mouth 2 (two) times daily.     . Calcium Carb-Cholecalciferol (CALCIUM 600 + D) 600-200 MG-UNIT TABS Take 1 tablet by mouth 2 (two)   times daily.    . cetirizine (ZYRTEC) 10 MG tablet Take 10 mg by mouth at bedtime.     . Cholecalciferol (VITAMIN D3) 2000 units TABS Take 1 tablet by mouth daily.    . Cranberry 250 MG CAPS Take 2 capsules by mouth daily. Take 2 capsules to = 500 mg po qd    . divalproex (DEPAKOTE) 125 MG DR tablet Take 3 tablets (375 mg total) by mouth 2 (two) times daily. 336 tablet 1  . hydrocortisone (ANUSOL-HC) 25 MG suppository Place 25 mg rectally as needed for hemorrhoids or itching.    . Melatonin 3 MG TABS Take 1 tablet by mouth at bedtime     . Multiple Vitamins-Minerals (MULTIVITAMIN WITH MINERALS) tablet Take 1 tablet by mouth daily.    . olopatadine (PATANOL) 0.1 % ophthalmic solution PLACE 1 DROP INTO BOTH EYES 2 (TWO) TIMES DAILY. 5 mL 1  . ondansetron (ZOFRAN) 4 MG tablet Take 1 tablet (4 mg total) by mouth every 6 (six) hours as needed for nausea. 10 tablet 0  . Protein (PROCEL) POWD Take 1 scoop by mouth 2 (two) times daily.    . hydrocortisone (ANUSOL-HC) 2.5 % rectal cream Place 1 application rectally as needed for hemorrhoids or itching.    . albuterol (PROVENTIL) (2.5 MG/3ML) 0.083% nebulizer solution Take 2.5 mg by nebulization 3 (three) times daily as needed for wheezing or shortness of breath.      No facility-administered medications prior to visit.     PAST MEDICAL HISTORY: Past Medical History:  Diagnosis Date  . Acute cystitis without hematuria   . Acute encephalopathy   . Arthritis   . Atrial fibrillation (HCC)    on Eliquis Rx  . Atrial flutter (HCC) January, 2012  . Chronic anticoagulation   . Colon polyp   . Constipation   . Hearing difficulty   . Hemorrhoids   . High cholesterol   . HLD (hyperlipidemia)   . Hypertension   . Left leg weakness 02/05/2013  . Left-sided weakness   . Malnutrition (HCC)   . Migraine   . Mild cognitive impairment with memory loss   . Osteoporosis   . Seizures (HCC) 06/26/2016  . Stroke (HCC) 08/02/12    right lenticular nucleus infarct, 2 strokes  . TIA (transient ischemic attack)   . Torus palatinus     PAST SURGICAL HISTORY: Past Surgical History:  Procedure Laterality Date  . APPENDECTOMY  02/19/53   `  . CARDIOVERSION  05/19/2010   Dr. Ganji  . CATARACT EXTRACTION W/ INTRAOCULAR LENS  IMPLANT, BILATERAL  2006-2008  . GANGLION CYST EXCISION Bilateral 1938,1954,2003,2005   "wrists/hand" (08/01/2012)  . MOUTH SURGERY     [name]  . TONSILLECTOMY  ~ 1935    FAMILY HISTORY: Family History  Problem Relation Age of Onset  . Cancer Mother     breast  . Aneurysm Mother     brain  . Stroke Mother   . Colon cancer Father   . Breast cancer    . Brain cancer    . Heart attack Neg Hx   . Diabetes Neg Hx   . Hypertension Neg Hx     SOCIAL HISTORY: Social History   Social History  . Marital status: Widowed    Spouse name: N/A  . Number of children: 3  . Years of education: college   Occupational History  . 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 Smoking status: Former   Smoker  . Smokeless tobacco: Never Used  . Alcohol use No     Comment: 08/01/2012 "glass of wine on special occasions"  . Drug use: No  . Sexual activity: No   Other Topics Concern  . Not on file   Social  History Narrative   Patient lives at home with daughters.   Caffeine use: 1/2-1 cup of coffee occasionally         Diet: Regular      Do you drink/ eat things with caffeine? Yes      Marital status:   Widowed                            What year were you married ?       Do you live in a house, apartment,assistred living, condo, trailer, etc.)? House      Is it one or more stories? Yes      How many persons live in your home ? 4      Do you have any pets in your home ?(please list) Yes/ 1 Dog, 2 Cats      Current or past profession: Editor      Do you exercise? No                             Type & how often:      Do you have a living will? Yes      Do you have a DNR form?  Yes                     If not, do you want to discuss one?       Do you have signed POA?HPOA forms? Yes                If so, please bring to your        appointment      regular     PHYSICAL EXAM  Vitals:   06/30/16 1503  BP: 114/64  Pulse: 96  Weight: 138 lb 6.4 oz (62.8 kg)   Body mass index is 22.34 kg/m.  Generalized: Frail female  in no acute distress  Head: normocephalic and atraumatic,. Oropharynx benign  Neck: Supple, no carotid bruits  Cardiac: Regular rate rhythm, no murmur  Musculoskeletal: flexion deformity left hand  Neurological examination   Mentation: Alert oriented to time, place, history obtained from daughters Attention span and concentration  diminished. Recent and remote memory poor.   Follows some  commands speech and language fluent.   Cranial nerve II-XII: .Pupils were equal round reactive to light extraocular movements were full, visual field were full on confrontational test. Facial sensation and strength were normal. hearing diminished. Uvula tongue midline. head turning and shoulder shrug were normal and symmetric.Tongue protrusion into cheek strength was normal. Motor: normal bulk and tone, full strength in the BUE, BLE,  Except mild weakness of left grip and  intrinsic hand muscles mild right hemiparesis 4 out of 5 Sensory: normal and symmetric to light touch, pinprick, and  Vibration, sensation  Coordination: finger-nose-finger, slow heel-to-shin cannot be performed Reflexes: 1+ and symmetric plantar responses were flexor bilaterally. Gait and Station:  [employer] DIAGNOSTIC DATA (LABS, IMAGING, TESTING) - I reviewed patient records, labs, notes, testing and imaging myself where available.  Lab Results  Component Value Date   WBC 6.9 04/28/2016   HGB  11.6 (A) 04/28/2016   HCT 37 04/28/2016   MCV 94.0 11/26/2015   PLT 176 04/28/2016      Component Value Date/Time   NA 144 04/28/2016   K 4.2 04/28/2016   CL 108 11/26/2015 0420   CO2 25 11/26/2015 0420   GLUCOSE 114 (H) 11/26/2015 0420   BUN 12 04/28/2016   CREATININE 0.5 04/28/2016   CREATININE 0.67 11/26/2015 0420   CALCIUM 8.5 (L) 11/26/2015 0420   PROT 6.0 (L) 11/23/2015 2055   ALBUMIN 3.3 (L) 11/23/2015 2055   AST 25 03/15/2016   ALT 19 03/15/2016   ALKPHOS 112 03/15/2016   BILITOT 0.4 11/23/2015 2055   GFRNONAA >60 11/26/2015 0420   GFRAA >60 11/26/2015 0420   Lab Results  Component Value Date   CHOL 122 03/15/2016   HDL 49 03/15/2016   LDLCALC 58 03/15/2016   TRIG 75 03/15/2016   CHOLHDL 3.7 11/24/2015   Lab Results  Component Value Date   HGBA1C 5.3 11/24/2015   Lab Results  Component Value Date   VITAMINB12 1,197 (H) 08/08/2015   Lab Results  Component Value Date   TSH 0.301 (L) 08/07/2015      ASSESSMENT AND PLAN 89 year with remote history of right hemispheric subcortical infarct in May 2014 with recent admission in  August 2017 with new right corpus callosal embolic infarct from atrial fibrillation. She has residual mild left hemiparesis and poor balance and gait difficulties. Recent left ACA embolic infarcts secondary to atrial fibrillation despite anticoagulation with now residual right hemiparesis as well. Recent episodes of transient staring  and unresponsiveness likely complex partial seizures and symptomatic epilepsy on 06/26/16.  The patient is a current patient of Dr. Sethi  who is out of the office today . This note is sent to the work in doctor.     Increase Depakote to 500 mg twice daily Continue eliquis for secondary stroke prevention and atrial fibrillation Continue Lipitor for hyperlipidemia  Continue occupational theapy Follow-up in 6 months I spent 25 min  in total face to face time with the patient and daughters  more than 50% of which was spent counseling and coordination of care, reviewing test results reviewing medications and discussing and reviewing the diagnosis of  Seizure disorder and further treatment options. ,  Carolyn , GNP, BC, APRN  [facility] [address], [address] [place], [place] [address] [phone] 

## 2016-06-30 NOTE — Patient Instructions (Signed)
Per nursing home sheet 

## 2016-07-01 ENCOUNTER — Encounter: Payer: Self-pay | Admitting: Nurse Practitioner

## 2016-07-01 NOTE — Progress Notes (Signed)
I have reviewed and agreed above plan. 

## 2016-07-04 NOTE — Progress Notes (Signed)
LOCATION: [facility]  PCP: Gildardo Cranker, DO   Code Status: DNR  Goals of care: Advanced Directive information Advanced Directives 06/28/2016  Does Patient Have a Medical Advance Directive? Yes  Type of Advance Directive Out of facility DNR (pink MOST or yellow form)  Does patient want to make changes to medical advance directive? -  Copy of [place] in Chart? -  Would patient like information on creating a medical advance directive? -  Pre-existing out of facility DNR order (yellow form or pink MOST form) Pink MOST form placed in chart (order not valid for inpatient use)       Extended Emergency Contact Information Primary Emergency Contact: [facility] Address: [address] [place], [place] [address] [place] Phone: [phone] Mobile Phone: [phone] Relation: Daughter Secondary Emergency Contact: Plummer,Kim Address: [place], [place] Phone: [phone] Work Phone: [phone] Mobile Phone: [phone] Relation: Daughter   Allergies  Allergen Reactions  . Aricept [Donepezil Hcl] Hypertension    With N/V  . Tramadol Hcl Other (See Comments)     lethargy, nausea  . Alendronate Sodium Rash and Other (See Comments)    puffy eyes    Chief Complaint  Patient presents with  . Acute Visit    Fall     HPI:  Patient is a 83 y.o. female seen today for acute visit. She complaints of neck pain post unwitnessed turing around in bed with her hanging out of the bed with neck extended. Per nursing, when they found the patient, she was hanging upside down from her bed with neck extended and complaining of pain. Per nursing, her head did not touch/ hit the floor. She is pleasantly confused and does not participate in HPI and ROS.     Review of Systems: per nursing staff Constitutional: Negative for fever HENT: Negative for congestion.  Respiratory: Negative  for shortness of breath.  Gastrointestinal: Negative for nausea, vomiting. Po intake has been fair.  Genitourinary: Has urinary incontinence  Musculoskeletal: positive for neck pain Skin: Negative for itching and rash.     Past Medical History:  Diagnosis Date  . Acute cystitis without hematuria   . Acute encephalopathy   . Arthritis   . Atrial fibrillation ([place])    on Eliquis Rx  . Atrial flutter ([place]) January, 2012  . Chronic anticoagulation   . Colon polyp   . Constipation   . Hearing difficulty   . Hemorrhoids   . High cholesterol   . HLD (hyperlipidemia)   . Hypertension   . Left leg weakness 02/05/2013  . Left-sided weakness   . Malnutrition ([place])   . Migraine   . Mild cognitive impairment with memory loss   . Osteoporosis   . Seizures ([place]) 06/26/2016  . Stroke ([place]) 08/02/12    right lenticular nucleus infarct, 2 strokes  . TIA (transient ischemic attack)   . Torus palatinus    Past Surgical History:  Procedure Laterality Date  . APPENDECTOMY  02/19/53   `  . CARDIOVERSION  05/19/2010   Dr. Einar Gip  . CATARACT EXTRACTION W/ INTRAOCULAR LENS  IMPLANT, BILATERAL  2006-2008  . GANGLION CYST EXCISION Bilateral 1938,1954,2003,2005   "wrists/hand" (08/01/2012)  . MOUTH SURGERY     [name]  . TONSILLECTOMY  ~ [place]   Social History:   reports that she  has quit smoking. She has never used smokeless tobacco. She reports that she does not drink alcohol or use drugs.  Family History  Problem Relation Age of Onset  . Cancer Mother     breast  . Aneurysm Mother     brain  . Stroke Mother   . Colon cancer Father   . Breast cancer    . Brain cancer    . Heart attack Neg Hx   . Diabetes Neg Hx   . Hypertension Neg Hx     Medications: Allergies as of 05/31/2016      Reactions   Aricept [donepezil Hcl] Hypertension   With N/V   Tramadol Hcl Other (See Comments)    lethargy, nausea   Alendronate Sodium Rash, Other (See Comments)   puffy eyes      Medication List         Accurate as of 05/31/16 11:59 PM. Always use your most recent med list.          acetaminophen 500 MG tablet Commonly known as:  TYLENOL Take 500 mg by mouth every 8 (eight) hours as needed.   acetaminophen 325 MG tablet Commonly known as:  TYLENOL Take 650 mg by mouth every 4 (four) hours as needed for fever. For temp greater than 101.5 per SO   CHLORASEPTIC SORE THROAT PO Take 2 sprays by mouth every 4 (four) hours as needed (sore throat). x2 weeks   albuterol (2.5 MG/3ML) 0.083% nebulizer solution Commonly known as:  PROVENTIL Take 2.5 mg by nebulization 3 (three) times daily as needed for wheezing or shortness of breath.   apixaban 5 MG Tabs tablet Commonly known as:  ELIQUIS Take 1 tablet (5 mg total) by mouth 2 (two) times daily.   atorvastatin 40 MG tablet Commonly known as:  LIPITOR Take 1 tablet (40 mg total) by mouth daily at 6 PM.   b complex vitamins tablet Take 1 tablet by mouth 2 (two) times daily.   CALCIUM 600 + D 600-200 MG-UNIT Tabs Generic drug:  Calcium Carb-Cholecalciferol Take 1 tablet by mouth 2 (two) times daily.   cetirizine 10 MG tablet Commonly known as:  ZYRTEC Take 10 mg by mouth at bedtime.   Cranberry 250 MG Caps Take 2 capsules by mouth daily. Take 2 capsules to = 500 mg po qd   divalproex 125 MG DR tablet Commonly known as:  DEPAKOTE Take 3 tablets (375 mg total) by mouth 2 (two) times daily.   doxycycline 100 MG capsule Commonly known as:  VIBRAMYCIN Take 100 mg by mouth 2 (two) times daily. Stop date 06/01/16   hydrocortisone 2.5 % rectal cream Commonly known as:  ANUSOL-HC Place 1 application rectally as needed for hemorrhoids or itching.   hydrocortisone 25 MG suppository Commonly known as:  ANUSOL-HC Place 25 mg rectally as needed for hemorrhoids or itching.   Melatonin 3 MG Tabs Take 1 tablet by mouth at bedtime   multivitamin with minerals tablet Take 1 tablet by mouth daily.   olopatadine 0.1 % ophthalmic  solution Commonly known as:  PATANOL PLACE 1 DROP INTO BOTH EYES 2 (TWO) TIMES DAILY.   ondansetron 4 MG tablet Commonly known as:  ZOFRAN Take 1 tablet (4 mg total) by mouth every 6 (six) hours as needed for nausea.   PROCEL Powd Take 1 scoop by mouth 2 (two) times daily.   saccharomyces boulardii 250 MG capsule Commonly known as:  FLORASTOR Take 250 mg by mouth 2 (two) times daily. Stop  date 06/06/16   Vitamin D3 2000 units Tabs Take 1 tablet by mouth daily.       Immunizations: Immunization History  Administered Date(s) Administered  . Influenza Split 01/18/2011  . Influenza Whole 02/10/2009, 12/09/2009  . Influenza, High Dose Seasonal PF 01/26/2013  . Influenza,inj,Quad PF,36+ Mos 11/27/2013, 12/11/2014  . PPD Test 09/20/2014, 08/13/2015, 08/26/2015  . Pneumococcal Conjugate-13 10/19/2013  . Pneumococcal Polysaccharide-23 05/06/2009  . Td 02/05/2008  . Zoster 01/05/2014     Physical Exam: Vitals:   05/31/16 1134  BP: (!) 168/87  Pulse: (!) 114  Resp: 18  Weight: 136 lb 9.6 oz (62 kg)  Height: 5\' 6"  (1.676 m)   Body mass index is 22.05 kg/m.  General- elderly female, frail and thin built, in no acute distress Head- normocephalic, atraumatic Nose- no nasal discharge Throat- moist mucus membrane Eyes- PERRLA, EOMI, no pallor, no icterus, no discharge Neck- no cervical lymphadenopathy, no cervical spine tenderness, paraspinal tenderness noted but no spasticity Cardiovascular- normal s1,s2, no murmur, trace leg edema Respiratory- bilateral clear to auscultation Abdomen- bowel sounds present, soft, non tender Musculoskeletal- generalized weakness present more prominent in lower extremities, Contracture to left hand with splint in place, no leg edema, heel floaters present Neurological- alert and oriented to self only Skin- warm and dry    Labs reviewed: Basic Metabolic Panel:  Recent Labs  08/08/15 0539  08/09/15 0633  09/12/15 1250  11/24/15 0416  11/25/15 0523 11/26/15 0420  01/23/16 04/01/16 04/06/16 04/28/16  NA 148*  < > 144  < > 142  < > 143 141 139  < > 140 142 141 144  K 4.0  < > 4.1  < > 4.2  < > 3.8 4.3 4.4  < > 4.2 4.6  --  4.2  CL 108  < > 109  < > 104  < > 111 110 108  --   --   --   --   --   CO2 29  < > 25  < > 31  < > 25 26 25   --   --   --   --   --   GLUCOSE 95  < > 102*  < > 89  < > 96 101* 114*  --   --   --   --   --   BUN 9  < > 10  < > 15  < > 15 10 15   < > 14 16 16 12   CREATININE 0.74  < > 0.76  < > 0.72  < > 0.82 0.60 0.67  < > 0.4* 0.6 0.4* 0.5  CALCIUM 8.7*  < > 8.3*  < > 9.1  < > 8.3* 8.3* 8.5*  --   --   --   --   --   MG 2.1  --  2.0  --  1.9  --   --   --   --   --   --   --   --   --   < > = values in this interval not displayed. Liver Function Tests:  Recent Labs  08/08/15 0539 09/12/15 1250 11/23/15 2055 03/15/16  AST 23 21 24 25   ALT 12* 11* 13* 19  ALKPHOS 43 49 61 112  BILITOT 0.6 0.4 0.4  --   PROT 6.0* 6.4* 6.0*  --   ALBUMIN 3.3* 3.4* 3.3*  --    No results for input(s): LIPASE, AMYLASE in the last 8760 hours.  Recent Labs  08/08/15 1303  AMMONIA 16   CBC:  Recent Labs  11/23/15 2055  11/25/15 0523 11/26/15 0420  12/31/15 01/23/16 04/01/16 04/06/16 04/28/16  WBC 4.9  --  5.4 8.2  < > 7.6 5.9 5.4 5.0 6.9  NEUTROABS 2.6  --   --   --   < > 5 4  --   --  5  HGB 12.3  < > 11.9* 12.3  < > 11.5* 10.6* 11.6* 11.8* 11.6*  HCT 38.9  < > 36.9 37.9  < > 36 33* 37 36 37  MCV 96.5  --  93.7 94.0  --   --   --   --   --   --   PLT 183  --  186 185  < > 240 209 226 297 176  < > = values in this interval not displayed. Cardiac Enzymes:  Recent Labs  07/31/15 1115 09/12/15 1250  TROPONINI <0.03 <0.03   BNP: Invalid input(s): POCBNP CBG:  Recent Labs  08/07/15 0202 11/23/15 2111  GLUCAP 102* 154*      Assessment/Plan  Neck pain Post trauma. Will get xray of cervical spine to rule out acute dislocation and fracture. Tylenol 500 mg x 1 now and then 500 mg q8h prn x 1  week.   Tachycardia Clinically does not appear dehydrated. Pain likely contributing to tachycardia. Pain management as above and monitor VS q shift for now  Elevated BP Pain likely contributed to elevated BP reading. Monitor VS for now. Currently not on any antihypertensives  Fall risk She has advanced dementia and is a high fall risk, monitor and to take fall precautions   Family/ staff Communication: reviewed care plan with patient's nursing supervisor. Daughter has been notified about care plan.    Blanchie Serve, MD Internal Medicine [facility] [address] [place], [place] [address] Cell Phone (Monday-Friday 8 am - 5 pm): [phone] On Call: [phone] and follow prompts after 5 pm and on weekends Office Phone: [phone] Office Fax: [phone]

## 2016-07-06 DIAGNOSIS — R509 Fever, unspecified: Secondary | ICD-10-CM | POA: Diagnosis not present

## 2016-07-06 DIAGNOSIS — R1110 Vomiting, unspecified: Secondary | ICD-10-CM | POA: Diagnosis not present

## 2016-07-08 DIAGNOSIS — D509 Iron deficiency anemia, unspecified: Secondary | ICD-10-CM | POA: Diagnosis not present

## 2016-07-08 DIAGNOSIS — Z79899 Other long term (current) drug therapy: Secondary | ICD-10-CM | POA: Diagnosis not present

## 2016-07-08 LAB — BASIC METABOLIC PANEL
BUN: 25 mg/dL — AB (ref 4–21)
Creatinine: 0.6 mg/dL (ref 0.5–1.1)
Glucose: 88 mg/dL
Potassium: 4.5 mmol/L (ref 3.4–5.3)
Sodium: 142 mmol/L (ref 137–147)

## 2016-07-08 LAB — CBC AND DIFFERENTIAL
HEMATOCRIT: 34 % — AB (ref 36–46)
HEMOGLOBIN: 10.8 g/dL — AB (ref 12.0–16.0)
NEUTROS ABS: 4 /uL
PLATELETS: 176 10*3/uL (ref 150–399)
WBC: 6.4 10*3/mL

## 2016-07-09 DIAGNOSIS — M79675 Pain in left toe(s): Secondary | ICD-10-CM | POA: Diagnosis not present

## 2016-07-09 DIAGNOSIS — I70203 Unspecified atherosclerosis of native arteries of extremities, bilateral legs: Secondary | ICD-10-CM | POA: Diagnosis not present

## 2016-07-09 DIAGNOSIS — I739 Peripheral vascular disease, unspecified: Secondary | ICD-10-CM | POA: Diagnosis not present

## 2016-07-09 DIAGNOSIS — B351 Tinea unguium: Secondary | ICD-10-CM | POA: Diagnosis not present

## 2016-07-12 ENCOUNTER — Non-Acute Institutional Stay (SKILLED_NURSING_FACILITY): Payer: Medicare Other | Admitting: Adult Health

## 2016-07-12 ENCOUNTER — Encounter: Payer: Self-pay | Admitting: Adult Health

## 2016-07-12 DIAGNOSIS — I10 Essential (primary) hypertension: Secondary | ICD-10-CM | POA: Diagnosis not present

## 2016-07-12 DIAGNOSIS — J189 Pneumonia, unspecified organism: Secondary | ICD-10-CM

## 2016-07-12 DIAGNOSIS — G40909 Epilepsy, unspecified, not intractable, without status epilepticus: Secondary | ICD-10-CM

## 2016-07-12 DIAGNOSIS — G4700 Insomnia, unspecified: Secondary | ICD-10-CM | POA: Diagnosis not present

## 2016-07-12 NOTE — Progress Notes (Signed)
Patient ID: KOBI ALLER, female   DOB: 1925/02/28, 83 y.o.   MRN: [id number]    DATE:  07/12/2016   MRN:  [id number]  BIRTHDAY: [birth year]  Facility:  [facility]urs[facility] Home Location:  [place]:  SNF [phone])  Contact Information    Name Relation Home Work [place] Daughter [phone]  [phone]   Plummer,Kim Daughter [phone]   Roell,Kit Daughter [phone]  [phone]       Code Status History    Date Active Date Inactive Code Status Order ID Comments User Context   11/26/2015 12:26 AM 11/26/2015  9:47 PM DNR [id number]  Asencion Partridge, MD Inpatient   11/24/2015 12:38 AM 11/24/2015  1:59 PM DNR [id number]  Maryellen Pile, MD Inpatient   08/07/2015  4:52 AM 08/12/2015  8:32 PM Partial Code [id number]  Edwin Dada, MD Inpatient   12/18/2014  7:17 PM 12/21/2014  2:11 PM Partial Code [id number]  Willia Craze, NP Inpatient   10/31/2014  3:32 AM 10/31/2014  7:24 PM Full Code [id number]  Theressa Millard, MD Inpatient   08/28/2013 12:58 AM 08/30/2013  4:40 PM Full Code [id number]  Kelvin Cellar, MD Inpatient   03/09/2013  6:50 PM 03/12/2013  6:27 PM Full Code [id number]  Verlee Monte, MD Inpatient   02/15/2013  6:26 PM 03/03/2013 12:40 PM Full Code [id number]  Cathlyn Parsons, PA-C Inpatient   02/12/2013 12:50 PM 02/15/2013  6:26 PM Full Code [id number]  Velvet Bathe, MD Inpatient   02/05/2013  7:35 PM 02/07/2013  8:48 PM Full Code [id number]  Kelvin Cellar, MD Inpatient   08/01/2012  5:49 PM 08/03/2012  2:28 PM Full Code [id number]  Melton Alar, PA-C Inpatient    Questions for Most Recent Historical Code Status (Order [id number])    Question Answer Comment   In the event of cardiac or respiratory ARREST Do not call a "code blue"    In the event of cardiac or respiratory ARREST Do not perform Intubation, CPR, defibrillation or ACLS    In the event of cardiac or respiratory ARREST Use medication by any route, position,  wound care, and other measures to relive pain and suffering. May use oxygen, suction and manual treatment of airway obstruction as needed for comfort.         Advance Directive Documentation     Most Recent Value  Type of Advance Directive  Out of facility DNR (pink MOST or yellow form)  Pre-existing out of facility DNR order (yellow form or pink MOST form)  Pink MOST form placed in chart (order not valid for inpatient use)  "MOST" Form in Place?  -       Chief Complaint  Patient presents with  . Medical Management of Chronic Issues    HISTORY OF PRESENT ILLNESS:  This is an 46-YO female seen for a routine visit. She is a long-term resident of [facility]. She had a recent seizure episode and her Depakote was increased to 500 mg BID by neurology. She is currently being treated for HCAP with Doxycycline. CXR done on 07/06/16 showed interval development of right perihilar atelectasis/infiltrate.   PAST MEDICAL HISTORY:  Past Medical History:  Diagnosis Date  . Acute cystitis without hematuria   . Acute encephalopathy   . Arthritis   . Atrial fibrillation ([place])    on Eliquis Rx  . Atrial flutter ([place]) January, 2012  . Chronic anticoagulation   .  Colon polyp   . Constipation   . Hearing difficulty   . Hemorrhoids   . High cholesterol   . HLD (hyperlipidemia)   . Hypertension   . Left leg weakness 02/05/2013  . Left-sided weakness   . Malnutrition ([place])   . Migraine   . Mild cognitive impairment with memory loss   . Osteoporosis   . Seizures ([place]) 06/26/2016  . Stroke ([place]) 08/02/12    right lenticular nucleus infarct, 2 strokes  . TIA (transient ischemic attack)   . Torus palatinus      CURRENT MEDICATIONS: Reviewed  Patient's Medications  New Prescriptions   No medications on file  Previous Medications   ACETAMINOPHEN (TYLENOL) 325 MG TABLET    Take 650 mg by mouth every 4 (four) hours as needed for fever. For temp greater than 101.5 per SO    ACETAMINOPHEN (TYLENOL) 500 MG TABLET    Take 500 mg by mouth every 8 (eight) hours as needed.   ALBUTEROL (PROVENTIL) (2.5 MG/3ML) 0.083% NEBULIZER SOLUTION    Take 2.5 mg by nebulization 3 (three) times daily as needed for wheezing or shortness of breath.   APIXABAN (ELIQUIS) 5 MG TABS TABLET    Take 1 tablet (5 mg total) by mouth 2 (two) times daily.   ATORVASTATIN (LIPITOR) 40 MG TABLET    Take 1 tablet (40 mg total) by mouth daily at 6 PM.   B COMPLEX VITAMINS TABLET    Take 1 tablet by mouth 2 (two) times daily.    CALCIUM CARB-CHOLECALCIFEROL (CALCIUM 600 + D) 600-200 MG-UNIT TABS    Take 1 tablet by mouth 2 (two) times daily.   CETIRIZINE (ZYRTEC) 10 MG TABLET    Take 10 mg by mouth at bedtime.    CHOLECALCIFEROL (VITAMIN D3) 2000 UNITS TABS    Take 1 tablet by mouth daily.   CRANBERRY 250 MG CAPS    Take 2 capsules by mouth daily. Take 2 capsules to = 500 mg po qd   DIVALPROEX (DEPAKOTE SPRINKLE) 125 MG CAPSULE    Take 500 mg by mouth 2 (two) times daily.   DOXYCYCLINE (VIBRA-TABS) 100 MG TABLET    Take 100 mg by mouth 2 (two) times daily.   HYDROCORTISONE (ANUSOL-HC) 25 MG SUPPOSITORY    Place 25 mg rectally as needed for hemorrhoids or itching.   MELATONIN 3 MG TABS    Take 1 tablet by mouth at bedtime    MULTIPLE VITAMINS-MINERALS (MULTIVITAMIN WITH MINERALS) TABLET    Take 1 tablet by mouth daily.   OLOPATADINE (PATANOL) 0.1 % OPHTHALMIC SOLUTION    PLACE 1 DROP INTO BOTH EYES 2 (TWO) TIMES DAILY.   ONDANSETRON (ZOFRAN) 4 MG TABLET    Take 1 tablet (4 mg total) by mouth every 6 (six) hours as needed for nausea.   PROTEIN (PROCEL) POWD    Take 1 scoop by mouth 2 (two) times daily.   SACCHAROMYCES BOULARDII (FLORASTOR) 250 MG CAPSULE    Take 250 mg by mouth 2 (two) times daily.  Modified Medications   No medications on file  Discontinued Medications   ACETAMINOPHEN (CHLORASEPTIC SORE THROAT PO)    Take 2 sprays by mouth every 4 (four) hours as needed (sore throat). x2 weeks    DIVALPROEX (DEPAKOTE) 125 MG DR TABLET    Take 3 tablets (375 mg total) by mouth 2 (two) times daily.     Allergies  Allergen Reactions  . Aricept [Donepezil Hcl] Hypertension    With N/V  .  Tramadol Hcl Other (See Comments)     lethargy, nausea  . Alendronate Sodium Rash and Other (See Comments)    puffy eyes     REVIEW OF SYSTEMS:  GENERAL: no change in appetite, no fatigue, no weight changes, no fever, chills or weakness EYES: Denies change in vision, dry eyes, eye pain, itching or discharge EARS: Denies change in hearing, ringing in ears, or earache NOSE: Denies nasal congestion or epistaxis MOUTH and THROAT: Denies oral discomfort, gingival pain or bleeding, pain from teeth or hoarseness   RESPIRATORY: no cough, SOB, DOE, wheezing, hemoptysis CARDIAC: no chest pain, edema or palpitations GI: no abdominal pain, diarrhea, constipation, heart burn, nausea or vomiting GU: Denies dysuria, frequency, hematuria, incontinence, or discharge NEUROLOGICAL: Denies dizziness, syncope, numbness, or headache PSYCHIATRIC: Denies feeling of depression or anxiety. No report of hallucinations, insomnia, paranoia, or agitation    PHYSICAL EXAMINATION  GENERAL APPEARANCE:  In no acute distress.  SKIN:  Skin is warm and dry.  HEAD: Normal in size and contour. No evidence of trauma EYES: Lids open and close normally. No blepharitis, entropion or ectropion. PERRL. Conjunctivae are clear and sclerae are white. Lenses are without opacity EARS: Pinnae are normal. Patient hears normal voice tunes of the examiner MOUTH and THROAT: Lips are without lesions. Oral mucosa is moist and without lesions. Tongue is normal in shape, size, and color and without lesions NECK: supple, trachea midline, no neck masses, no thyroid tenderness, no thyromegaly LYMPHATICS: no LAN in the neck, no supraclavicular LAN RESPIRATORY: breathing is even & unlabored, BS CTAB CARDIAC: RRR, no murmur,no extra heart sounds, no  edema GI: abdomen soft, normal BS, no masses, no tenderness, no hepatomegaly, no splenomegaly EXTREMITIES:  Able to move X 4 extremities, left hemiparesis, left hand has splint PSYCHIATRIC: Alert to self, disoriented to time and place. Affect and behavior are appropriate  LABS/RADIOLOGY: Labs reviewed: Basic Metabolic Panel:  Recent Labs  08/08/15 0539  08/09/15 0633  09/12/15 1250  11/24/15 0416 11/25/15 0523 11/26/15 0420  04/01/16 04/06/16 04/28/16 07/08/16  NA 148*  < > 144  < > 142  < > 143 141 139  < > 142 141 144 142  K 4.0  < > 4.1  < > 4.2  < > 3.8 4.3 4.4  < > 4.6  --  4.2 4.5  CL 108  < > 109  < > 104  < > 111 110 108  --   --   --   --   --   CO2 29  < > 25  < > 31  < > '25 26 25  '$ --   --   --   --   --   GLUCOSE 95  < > 102*  < > 89  < > 96 101* 114*  --   --   --   --   --   BUN 9  < > 10  < > 15  < > '15 10 15  '$ < > '16 16 12 '$ 25*  CREATININE 0.74  < > 0.76  < > 0.72  < > 0.82 0.60 0.67  < > 0.6 0.4* 0.5 0.6  CALCIUM 8.7*  < > 8.3*  < > 9.1  < > 8.3* 8.3* 8.5*  --   --   --   --   --   MG 2.1  --  2.0  --  1.9  --   --   --   --   --   --   --   --   --   < > =  values in this interval not displayed. Liver Function Tests:  Recent Labs  08/08/15 0539 09/12/15 1250 11/23/15 2055 03/15/16  AST '23 21 24 25  '$ ALT 12* 11* 13* 19  ALKPHOS 43 49 61 112  BILITOT 0.6 0.4 0.4  --   PROT 6.0* 6.4* 6.0*  --   ALBUMIN 3.3* 3.4* 3.3*  --     Recent Labs  08/08/15 1303  AMMONIA 16   CBC:  Recent Labs  11/23/15 2055  11/25/15 0523 11/26/15 0420  01/23/16  04/06/16 04/28/16 07/08/16  WBC 4.9  --  5.4 8.2  < > 5.9  < > 5.0 6.9 6.4  NEUTROABS 2.6  --   --   --   < > 4  --   --  5 4  HGB 12.3  < > 11.9* 12.3  < > 10.6*  < > 11.8* 11.6* 10.8*  HCT 38.9  < > 36.9 37.9  < > 33*  < > 36 37 34*  MCV 96.5  --  93.7 94.0  --   --   --   --   --   --   PLT 183  --  186 185  < > 209  < > 297 176 176  < > = values in this interval not displayed. Lipid Panel:  Recent Labs   08/07/15 0551 11/24/15 0417 03/15/16  HDL 47 48 49   Cardiac Enzymes:  Recent Labs  07/31/15 1115 09/12/15 1250  TROPONINI <0.03 <0.03   CBG:  Recent Labs  08/07/15 0202 11/23/15 2111  GLUCAP 102* 154*      ASSESSMENT/PLAN:   HCAP - continue Doxycycline 100 mg 1 tab PO BID  For a total of 10 days with stop date 07/16/16 and Florastot 250 mg 1 capsule PO BID for a total of 13 days with stop date  07/19/16  Seizure - Depakote was recently increased from 375 mg to 500 mg BID; had a recent follow-up with Neurology on 06/30/16   Essential Hypertension - not on any medications; stable  Insomnia - continue Melatonin 3 mg 1 tab PO Q HS    Goals of care:  Long-term care    Monina C. Lehigh - NP    [employer] [phone]

## 2016-07-13 ENCOUNTER — Ambulatory Visit: Payer: Medicare Other | Admitting: Nurse Practitioner

## 2016-07-21 DIAGNOSIS — R1312 Dysphagia, oropharyngeal phase: Secondary | ICD-10-CM | POA: Diagnosis not present

## 2016-07-26 DIAGNOSIS — R1312 Dysphagia, oropharyngeal phase: Secondary | ICD-10-CM | POA: Diagnosis not present

## 2016-07-27 DIAGNOSIS — R488 Other symbolic dysfunctions: Secondary | ICD-10-CM | POA: Diagnosis not present

## 2016-07-27 DIAGNOSIS — R293 Abnormal posture: Secondary | ICD-10-CM | POA: Diagnosis not present

## 2016-07-27 DIAGNOSIS — M6281 Muscle weakness (generalized): Secondary | ICD-10-CM | POA: Diagnosis not present

## 2016-07-28 DIAGNOSIS — R488 Other symbolic dysfunctions: Secondary | ICD-10-CM | POA: Diagnosis not present

## 2016-07-28 DIAGNOSIS — R293 Abnormal posture: Secondary | ICD-10-CM | POA: Diagnosis not present

## 2016-07-28 DIAGNOSIS — M6281 Muscle weakness (generalized): Secondary | ICD-10-CM | POA: Diagnosis not present

## 2016-07-29 DIAGNOSIS — Z79899 Other long term (current) drug therapy: Secondary | ICD-10-CM | POA: Diagnosis not present

## 2016-07-29 DIAGNOSIS — R488 Other symbolic dysfunctions: Secondary | ICD-10-CM | POA: Diagnosis not present

## 2016-07-29 DIAGNOSIS — M6281 Muscle weakness (generalized): Secondary | ICD-10-CM | POA: Diagnosis not present

## 2016-07-29 DIAGNOSIS — R293 Abnormal posture: Secondary | ICD-10-CM | POA: Diagnosis not present

## 2016-07-30 DIAGNOSIS — R488 Other symbolic dysfunctions: Secondary | ICD-10-CM | POA: Diagnosis not present

## 2016-07-30 DIAGNOSIS — M6281 Muscle weakness (generalized): Secondary | ICD-10-CM | POA: Diagnosis not present

## 2016-07-30 DIAGNOSIS — R293 Abnormal posture: Secondary | ICD-10-CM | POA: Diagnosis not present

## 2016-08-02 DIAGNOSIS — R293 Abnormal posture: Secondary | ICD-10-CM | POA: Diagnosis not present

## 2016-08-02 DIAGNOSIS — M6281 Muscle weakness (generalized): Secondary | ICD-10-CM | POA: Diagnosis not present

## 2016-08-02 DIAGNOSIS — R488 Other symbolic dysfunctions: Secondary | ICD-10-CM | POA: Diagnosis not present

## 2016-08-03 DIAGNOSIS — M6281 Muscle weakness (generalized): Secondary | ICD-10-CM | POA: Diagnosis not present

## 2016-08-03 DIAGNOSIS — R293 Abnormal posture: Secondary | ICD-10-CM | POA: Diagnosis not present

## 2016-08-03 DIAGNOSIS — R488 Other symbolic dysfunctions: Secondary | ICD-10-CM | POA: Diagnosis not present

## 2016-08-04 DIAGNOSIS — R488 Other symbolic dysfunctions: Secondary | ICD-10-CM | POA: Diagnosis not present

## 2016-08-04 DIAGNOSIS — M6281 Muscle weakness (generalized): Secondary | ICD-10-CM | POA: Diagnosis not present

## 2016-08-04 DIAGNOSIS — R293 Abnormal posture: Secondary | ICD-10-CM | POA: Diagnosis not present

## 2016-08-04 IMAGING — CT CT ABD-PELV W/ CM
2 of 6 series · 15 of 46 positions shown, 17 images · IV contrast (APPLIED)
Comparison: CT scan dated 11/22/2013

CLINICAL DATA: Left lower quadrant pain.

EXAM:
CT ABDOMEN AND PELVIS WITH CONTRAST
TECHNIQUE: Multidetector CT imaging of the abdomen and pelvis was performed
using the standard protocol following bolus administration of
intravenous contrast.
CONTRAST:  100mL OMNIPAQUE IOHEXOL 300 MG/ML  SOLN

[Series 2: abd/ pelvis 5.0 i30f 1 · axial · 0.70mm/px · z∈[+301,+746]mm · 12 of 99 slices shown, 14 images]
[im 5/99  soft-tissue]
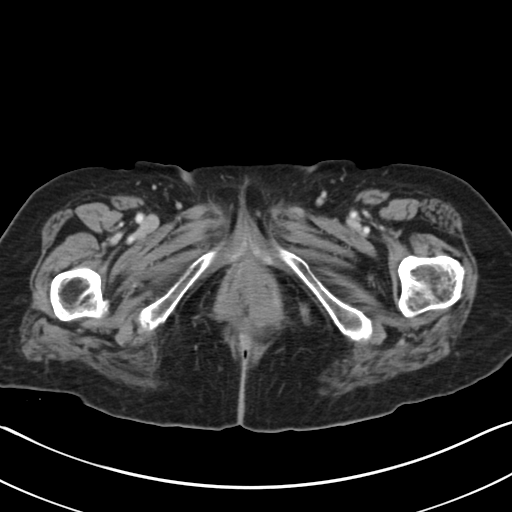
[im 5/99  bone]
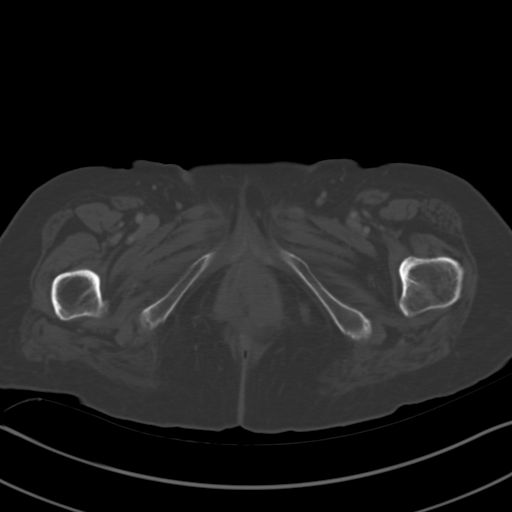
[im 15/99  soft-tissue]
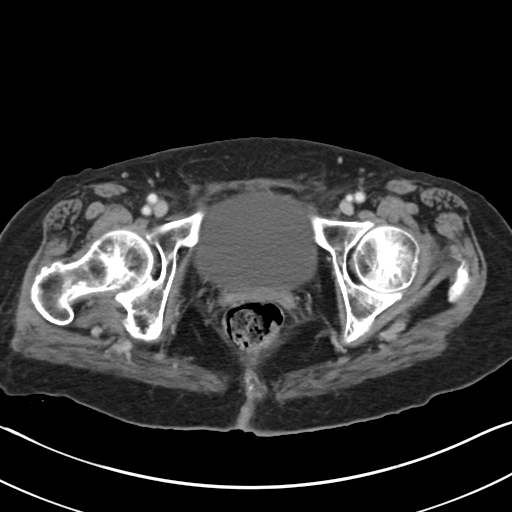
[im 20/99  soft-tissue]
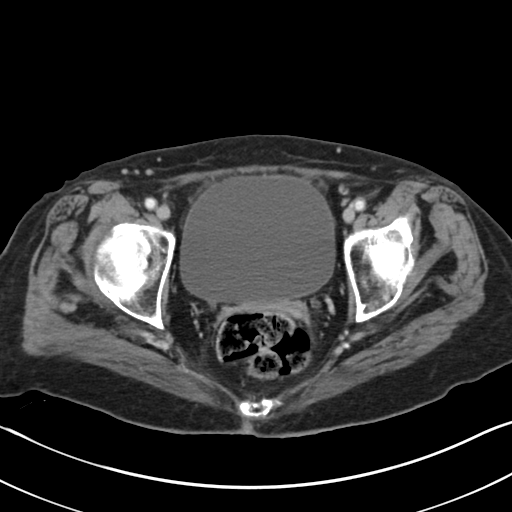
[im 30/99  soft-tissue]
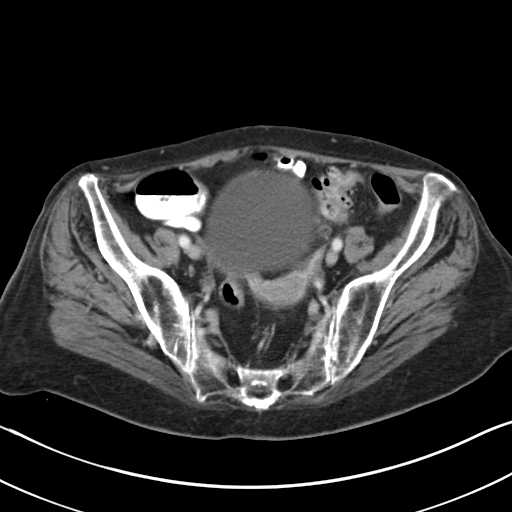
[im 40/99  soft-tissue]
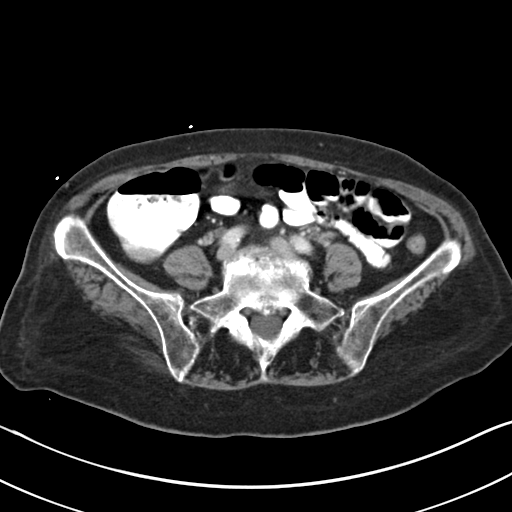
[im 45/99  soft-tissue]
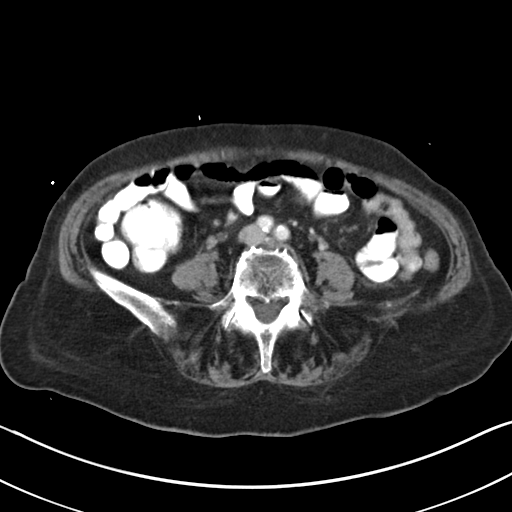
[im 54/99  soft-tissue]
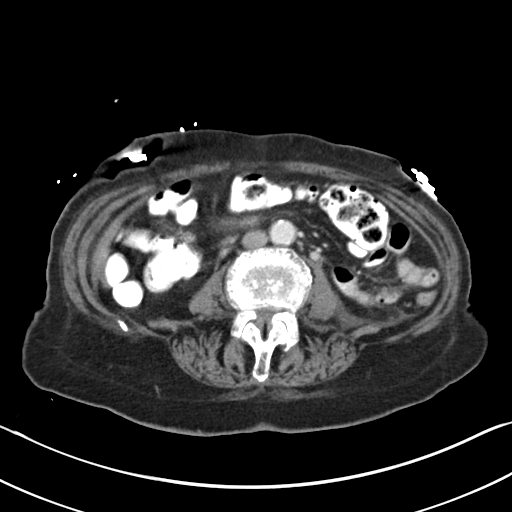
[im 59/99  soft-tissue]
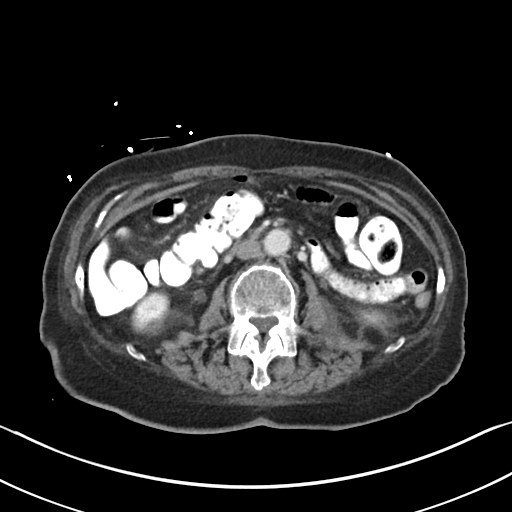
[im 69/99  soft-tissue]
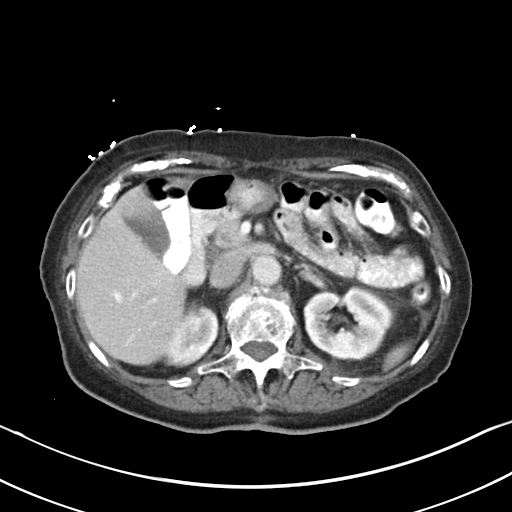
[im 69/99  bone]
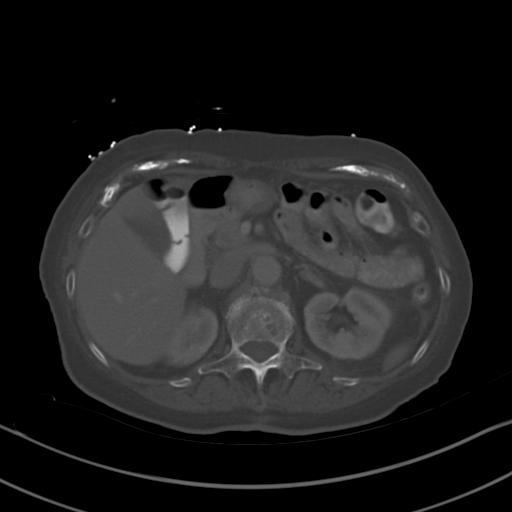
[im 79/99  soft-tissue]
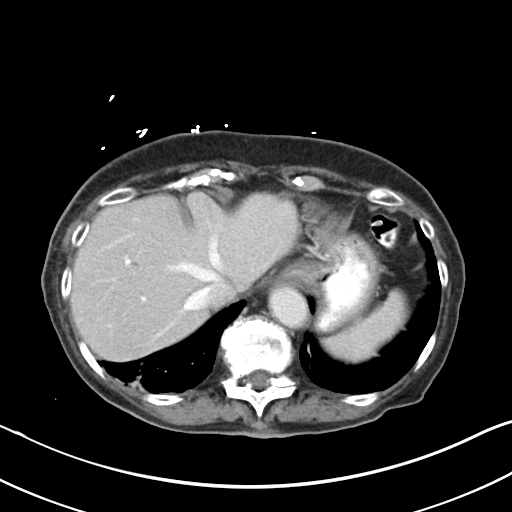
[im 84/99  soft-tissue]
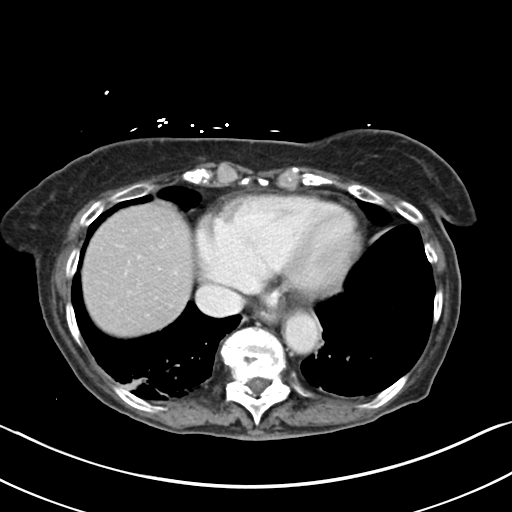
[im 94/99  soft-tissue]
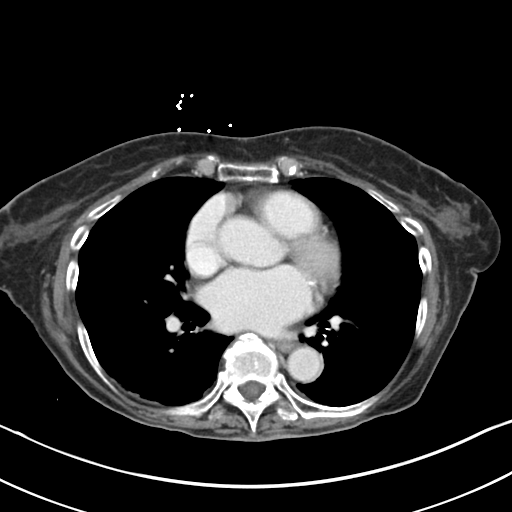

[Series 7: coronal soft tissue · coronal · 0.76mm/px · 3 of 70 slices shown]
[im 24/70  soft-tissue]
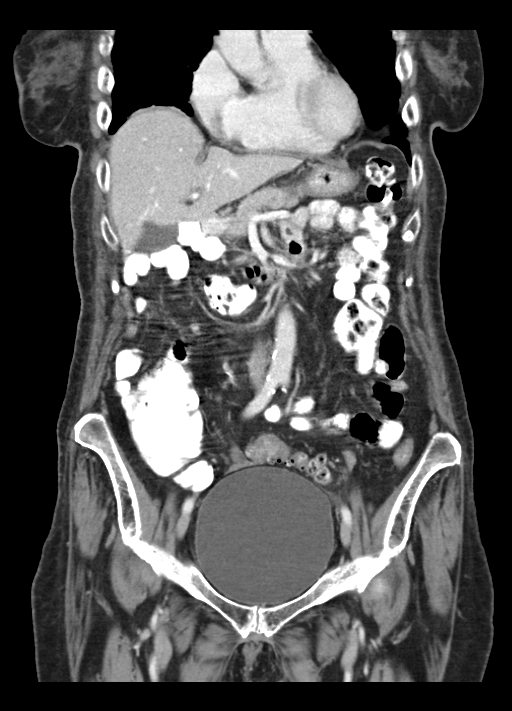
[im 31/70  soft-tissue]
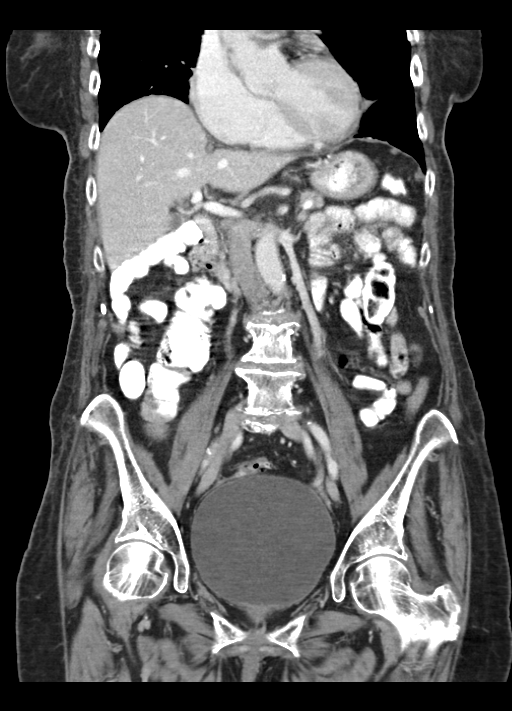
[im 39/70  soft-tissue]
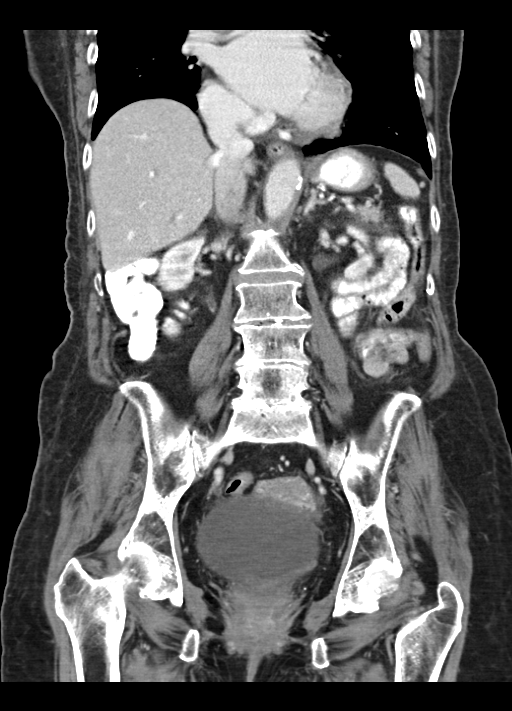

[15 of 46 positions shown; findings below may reference images not displayed]

FINDINGS: Lower chest: There is a 4 mm nodule in the right midzone, probably
in the right upper lobe seen on image 5 of series 5. Heart size is
normal. Focal area of atelectasis at the right lung base
posteriorly.

Hepatobiliary: Normal.

Pancreas: Normal.

Spleen: Normal.

Adrenals/Urinary Tract: Stable 12 mm nodule in the right adrenal
gland. Left adrenal gland is normal.

There is small amount of fluid in the left perinephric space, new.
There are no renal calculi. The ureters are slightly prominent
bilaterally but there is no obstruction. The bladder appears normal.
Does the patient have any symptoms of urinary tract infection? If
the patient have recently passed a stone? No findings of
pyelonephritis in the renal parenchyma.

There are 3 tiny cysts in the right kidney.

Stomach/Bowel: There are numerous diverticula in the sigmoid portion
of the colon but there is no discrete diverticulitis. Appendix has
been removed. Small bowel is normal.

Vascular/Lymphatic: Slight aortic atherosclerosis.  No adenopathy.

Other: Tiny amount of free fluid in the pelvis.  No free air.

Musculoskeletal: No acute abnormality. Multilevel degenerative disc
and joint disease in the lumbar spine.
IMPRESSION: 1. Left perinephric fluid without renal or ureteral or bladder
stone. No abnormal appearing parenchyma in the left kidney. I
suspect the patient may have recently passed a stone.
2. Sigmoid diverticulosis without diverticulitis.
3. Aortic atherosclerosis.
4. 4 mm nodule in the right upper lobe. If the patient is at high
risk for bronchogenic carcinoma, follow-up chest CT at 1 year is
recommended. If the patient is at low risk, no follow-up is needed.
This recommendation follows the consensus statement: Guidelines for
Management of Small Pulmonary Nodules Detected on CT Scans: A
Statement from the [HOSPITAL] as published in Radiology
5. Focal atelectasis at the right lung base.

## 2016-08-05 ENCOUNTER — Non-Acute Institutional Stay (SKILLED_NURSING_FACILITY): Payer: Medicare Other | Admitting: Internal Medicine

## 2016-08-05 ENCOUNTER — Encounter: Payer: Self-pay | Admitting: Internal Medicine

## 2016-08-05 DIAGNOSIS — G40909 Epilepsy, unspecified, not intractable, without status epilepticus: Secondary | ICD-10-CM | POA: Diagnosis not present

## 2016-08-05 DIAGNOSIS — R293 Abnormal posture: Secondary | ICD-10-CM | POA: Diagnosis not present

## 2016-08-05 DIAGNOSIS — R5383 Other fatigue: Secondary | ICD-10-CM | POA: Diagnosis not present

## 2016-08-05 DIAGNOSIS — M6281 Muscle weakness (generalized): Secondary | ICD-10-CM | POA: Diagnosis not present

## 2016-08-05 DIAGNOSIS — R488 Other symbolic dysfunctions: Secondary | ICD-10-CM | POA: Diagnosis not present

## 2016-08-05 NOTE — Progress Notes (Signed)
This is an acute visit.  Level care skilled.  Facility is [facility]  Acute visit follow-up possible lethargy.  History of present illness.  Patient is an 83 year old female whose long-term resident of this facility.  She recent seizure episode her Depakote was increased to 500 mg twice a day by neurology.  She also recently was treated for pneumonia with doxycycline.  Her family is concerned that she's been somewhat more lethargic at times since her Depakote was increased.  There have been no further seizures.  Vital signs appear to be stable she does have significant dementia but appears to be bright and alert today.  She is about to eat lunch they are going to get her out of bed  PAST MEDICAL HISTORY:      Past Medical History:  Diagnosis Date  . Acute cystitis without hematuria   . Acute encephalopathy   . Arthritis   . Atrial fibrillation ([place])    on Eliquis Rx  . Atrial flutter ([place]) January, 2012  . Chronic anticoagulation   . Colon polyp   . Constipation   . Hearing difficulty   . Hemorrhoids   . High cholesterol   . HLD (hyperlipidemia)   . Hypertension   . Left leg weakness 02/05/2013  . Left-sided weakness   . Malnutrition ([place])   . Migraine   . Mild cognitive impairment with memory loss   . Osteoporosis   . Seizures ([place]) 06/26/2016  . Stroke ([place]) 08/02/12    right lenticular nucleus infarct, 2 strokes  . TIA (transient ischemic attack)   . Torus palatinus      CURRENT MEDICATIONS: Reviewed  Patient's Medications  New Prescriptions   No medications on file  Previous Medications   ACETAMINOPHEN (TYLENOL) 325 MG TABLET    Take 650 mg by mouth every 4 (four) hours as needed for fever. For temp greater than 101.5 per SO   ACETAMINOPHEN (TYLENOL) 500 MG TABLET    Take 500 mg by mouth every 8 (eight) hours as needed.   ALBUTEROL (PROVENTIL) (2.5 MG/3ML) 0.083% NEBULIZER SOLUTION    Take 2.5 mg by nebulization 3  (three) times daily as needed for wheezing or shortness of breath.   APIXABAN (ELIQUIS) 5 MG TABS TABLET    Take 1 tablet (5 mg total) by mouth 2 (two) times daily.   ATORVASTATIN (LIPITOR) 40 MG TABLET    Take 1 tablet (40 mg total) by mouth daily at 6 PM.   B COMPLEX VITAMINS TABLET    Take 1 tablet by mouth 2 (two) times daily.    CALCIUM CARB-CHOLECALCIFEROL (CALCIUM 600 + D) 600-200 MG-UNIT TABS    Take 1 tablet by mouth 2 (two) times daily.   CETIRIZINE (ZYRTEC) 10 MG TABLET    Take 10 mg by mouth at bedtime.    CHOLECALCIFEROL (VITAMIN D3) 2000 UNITS TABS    Take 1 tablet by mouth daily.   CRANBERRY 250 MG CAPS    Take 2 capsules by mouth daily. Take 2 capsules to = 500 mg po qd   DIVALPROEX (DEPAKOTE SPRINKLE) 125 MG CAPSULE    Take 500 mg by mouth 2 (two) times daily.        HYDROCORTISONE (ANUSOL-HC) 25 MG SUPPOSITORY    Place 25 mg rectally as needed for hemorrhoids or itching.   MELATONIN 3 MG TABS    Take 1 tablet by mouth at bedtime    MULTIPLE VITAMINS-MINERALS (MULTIVITAMIN WITH MINERALS) TABLET    Take 1  tablet by mouth daily.   OLOPATADINE (PATANOL) 0.1 % OPHTHALMIC SOLUTION    PLACE 1 DROP INTO BOTH EYES 2 (TWO) TIMES DAILY.   ONDANSETRON (ZOFRAN) 4 MG TABLET    Take 1 tablet (4 mg total) by mouth every 6 (six) hours as needed for nausea.   PROTEIN (PROCEL) POWD    Take 1 scoop by mouth 2 (two) times daily.   SACCHAROMYCES BOULARDII (FLORASTOR) 250 MG CAPSULE    Take 250 mg by mouth 2 (two) times daily.  Modified Medications   No medications on file  Discontinued Medications   ACETAMINOPHEN (CHLORASEPTIC SORE THROAT PO)    Take 2 sprays by mouth every 4 (four) hours as needed (sore throat). x2 weeks   DIVALPROEX (DEPAKOTE) 125 MG DR TABLET    Take 3 tablets (375 mg total) by mouth 2 (two) times daily.          Allergies  Allergen Reactions  . Aricept [Donepezil Hcl] Hypertension    With N/V  . Tramadol Hcl Other (See Comments)     lethargy,  nausea  . Alendronate Sodium Rash and Other (See Comments)    puffy eyes     REVIEW OF SYSTEMS:   This is very limited since patient is a poor historian-but per nursing staff-there've been no other recent seizures-she is not complaining of headache or dizziness chest pain or increased shortness of breath.  Apparently her lethargy is intermittent per discussion with her daughters at bedside    PHYSICAL EXAMINATION Temperature is 98.1 pulse 83 respirations 20 blood pressure 136/74 O2 saturation is 95% on room air GENERAL APPEARANCE:   frail elderly female currently lying bed she is alert somewhat agitated with exam SKIN:  Skin is warm and dry.  HEAD: Normal in size and contour. No evidence of trauma EYES: Lids open and close normally. No blepharitis, entropion or ectropion. PERRL. Conjunctivae are clear and sclerae are white. Lenses are without opacity EARS: Pinnae are normal. Patient hears normal voice tunes of the examiner MOUTH and THROAT: Lips are without lesions. Oral mucosa is moist and without lesions. Tongue is normal in shape, size, and color and without lesions  RESPIRATORY: breathing is even & unlabored, BS CTAB with somewhat poor respiratory effort CARDIAC: RRR, no murmur,no extra heart sounds, no edema GI: abdomen soft, normal BS, no masses, no tenderness, no hepatomegaly, no splenomegaly EXTREMITIES:  Able to move X 4 extremities, left hemiparesis, limited strength on left versus right PSYCHIATRIC: Alert to self, disoriented to time and place. Alert -somewhat agitated with exam  LABS/RADIOLOGY: Labs reviewed:  07/08/2016.  WBC 6.4 hemoglobin 10.8 platelets 176.  Sodium 142 potassium 4.5 BUN 25 creatinine 0.6.  06/29/2016.  Depakote 47.5.  03/15/2016.  Liver function tests within normal limits   Basic Metabolic Panel:  [id number]([id number])   Recent Labs  08/08/15 0539  08/09/15 2440  09/12/15 1250  11/24/15 0416  11/25/15 0523 11/26/15 0420  04/01/16 04/06/16 04/28/16 07/08/16  NA 148*  < > 144  < > 142  < > 143 141 139  < > 142 141 144 142  K 4.0  < > 4.1  < > 4.2  < > 3.8 4.3 4.4  < > 4.6  --  4.2 4.5  CL 108  < > 109  < > 104  < > 111 110 108  --   --   --   --   --   CO2 29  < > 25  < > 31  < >  25 26 25   --   --   --   --   --   GLUCOSE 95  < > 102*  < > 89  < > 96 101* 114*  --   --   --   --   --   BUN 9  < > 10  < > 15  < > 15 10 15   < > 16 16 12  25*  CREATININE 0.74  < > 0.76  < > 0.72  < > 0.82 0.60 0.67  < > 0.6 0.4* 0.5 0.6  CALCIUM 8.7*  < > 8.3*  < > 9.1  < > 8.3* 8.3* 8.5*  --   --   --   --   --   MG 2.1  --  2.0  --  1.9  --   --   --   --   --   --   --   --   --   < > = values in this interval not displayed.   Liver Function Tests:  RecentLabs(withinlast365days)   Recent Labs  08/08/15 0539 09/12/15 1250 11/23/15 2055 03/15/16  AST 23 21 24 25   ALT 12* 11* 13* 19  ALKPHOS 43 49 61 112  BILITOT 0.6 0.4 0.4  --   PROT 6.0* 6.4* 6.0*  --   ALBUMIN 3.3* 3.4* 3.3*  --       RecentLabs(withinlast365days)   Recent Labs  08/08/15 1303  AMMONIA 16     CBC:  RecentLabs(withinlast365days)   Recent Labs  11/23/15 2055  11/25/15 0523 11/26/15 0420  01/23/16  04/06/16 04/28/16 07/08/16  WBC 4.9  --  5.4 8.2  < > 5.9  < > 5.0 6.9 6.4  NEUTROABS 2.6  --   --   --   < > 4  --   --  5 4  HGB 12.3  < > 11.9* 12.3  < > 10.6*  < > 11.8* 11.6* 10.8*  HCT 38.9  < > 36.9 37.9  < > 33*  < > 36 37 34*  MCV 96.5  --  93.7 94.0  --   --   --   --   --   --   PLT 183  --  186 185  < > 209  < > 297 176 176  < > = values in this interval not displayed.   Lipid Panel:  RecentLabs(withinlast365days)   Recent Labs  08/07/15 0551 11/24/15 0417 03/15/16  HDL 47 48 49     Cardiac Enzymes:  RecentLabs(withinlast365days)   Recent Labs  07/31/15 1115 09/12/15 1250  TROPONINI <0.03 <0.03     [place] plan.  #1  lethargy-apparently this is intermittent-patient's daughters are concerned however that her Depakote dose may be contributing to this-will update a Depakote level tomorrow a.m. as well as a CBC and a CMP.  Also have written order for nursing to contact [facility] about family's concerns-they have been following her and have been dosing her Depakote.  There are been no recent seizures since the one approximately a month ago per daughters-neurologically she appears to be at baseline she is alert today.  [id number] note greater than 25 minutes spent assessing patient-reviewing her chart reviewing her labs-discussing her status with nursing staff as well as with her daughters who are at bedside-and coordinating and formulating plan of care-of note greater than 50% of time spent coordinating plan of care

## 2016-08-06 DIAGNOSIS — R293 Abnormal posture: Secondary | ICD-10-CM | POA: Diagnosis not present

## 2016-08-06 DIAGNOSIS — M6281 Muscle weakness (generalized): Secondary | ICD-10-CM | POA: Diagnosis not present

## 2016-08-06 DIAGNOSIS — R488 Other symbolic dysfunctions: Secondary | ICD-10-CM | POA: Diagnosis not present

## 2016-08-09 ENCOUNTER — Non-Acute Institutional Stay (SKILLED_NURSING_FACILITY): Payer: Medicare Other | Admitting: Adult Health

## 2016-08-09 ENCOUNTER — Encounter: Payer: Self-pay | Admitting: Adult Health

## 2016-08-09 DIAGNOSIS — Z79899 Other long term (current) drug therapy: Secondary | ICD-10-CM | POA: Diagnosis not present

## 2016-08-09 DIAGNOSIS — M6281 Muscle weakness (generalized): Secondary | ICD-10-CM | POA: Diagnosis not present

## 2016-08-09 DIAGNOSIS — G40909 Epilepsy, unspecified, not intractable, without status epilepticus: Secondary | ICD-10-CM

## 2016-08-09 DIAGNOSIS — D649 Anemia, unspecified: Secondary | ICD-10-CM | POA: Diagnosis not present

## 2016-08-09 DIAGNOSIS — Z8673 Personal history of transient ischemic attack (TIA), and cerebral infarction without residual deficits: Secondary | ICD-10-CM

## 2016-08-09 DIAGNOSIS — K649 Unspecified hemorrhoids: Secondary | ICD-10-CM | POA: Diagnosis not present

## 2016-08-09 DIAGNOSIS — J309 Allergic rhinitis, unspecified: Secondary | ICD-10-CM | POA: Diagnosis not present

## 2016-08-09 DIAGNOSIS — D638 Anemia in other chronic diseases classified elsewhere: Secondary | ICD-10-CM | POA: Diagnosis not present

## 2016-08-09 DIAGNOSIS — I480 Paroxysmal atrial fibrillation: Secondary | ICD-10-CM

## 2016-08-09 DIAGNOSIS — R293 Abnormal posture: Secondary | ICD-10-CM | POA: Diagnosis not present

## 2016-08-09 DIAGNOSIS — I10 Essential (primary) hypertension: Secondary | ICD-10-CM | POA: Diagnosis not present

## 2016-08-09 DIAGNOSIS — R488 Other symbolic dysfunctions: Secondary | ICD-10-CM | POA: Diagnosis not present

## 2016-08-09 NOTE — Progress Notes (Signed)
DATE:  08/09/2016   MRN:  [id number]  BIRTHDAY: December 14, 1924  Facility:  [facility] Home Location:  [place] Room Number: 203-B  LEVEL OF CARE:  [id number]  Contact Information    Name Relation Home Work [place] Daughter [phone]  [phone]   Plummer,Kim Daughter [phone]   Vandyne,Kit Daughter [phone]  [phone]       Code Status History    Date Active Date Inactive Code Status Order ID Comments User Context   11/26/2015 12:26 AM 11/26/2015  9:47 PM DNR [id number]  Asencion Partridge, MD Inpatient   11/24/2015 12:38 AM 11/24/2015  1:59 PM DNR [id number]  Maryellen Pile, MD Inpatient   08/07/2015  4:52 AM 08/12/2015  8:32 PM Partial Code [id number]  Edwin Dada, MD Inpatient   12/18/2014  7:17 PM 12/21/2014  2:11 PM Partial Code [id number]  Willia Craze, NP Inpatient   10/31/2014  3:32 AM 10/31/2014  7:24 PM Full Code [id number]  Theressa Millard, MD Inpatient   08/28/2013 12:58 AM 08/30/2013  4:40 PM Full Code [id number]  Kelvin Cellar, MD Inpatient   03/09/2013  6:50 PM 03/12/2013  6:27 PM Full Code [id number]  Verlee Monte, MD Inpatient   02/15/2013  6:26 PM 03/03/2013 12:40 PM Full Code [id number]  Cathlyn Parsons, PA-C Inpatient   02/12/2013 12:50 PM 02/15/2013  6:26 PM Full Code [id number]  Velvet Bathe, MD Inpatient   02/05/2013  7:35 PM 02/07/2013  8:48 PM Full Code [id number]  Kelvin Cellar, MD Inpatient   08/01/2012  5:49 PM 08/03/2012  2:28 PM Full Code [id number]  Melton Alar, PA-C Inpatient    Questions for Most Recent Historical Code Status (Order [id number])    Question Answer Comment   In the event of cardiac or respiratory ARREST Do not call a "code blue"    In the event of cardiac or respiratory ARREST Do not perform Intubation, CPR, defibrillation or ACLS    In the event of cardiac or respiratory ARREST Use medication by any route, position, wound care, and other measures to  relive pain and suffering. May use oxygen, suction and manual treatment of airway obstruction as needed for comfort.         Advance Directive Documentation     Most Recent Value  Type of Advance Directive  Out of facility DNR (pink MOST or yellow form)  Pre-existing out of facility DNR order (yellow form or pink MOST form)  -  "MOST" Form in Place?  -       Chief Complaint  Patient presents with  . Medical Management of Chronic Issues    HISTORY OF PRESENT ILLNESS:  This is an 89-YO female seen for a routine visit.  She is a long-term care resident at [facility]. She was seen in the room today with CNA @ bedside. She was verbally responsive and was smiling. She was recently noted to be lethargic. Lab review showed patient's depakote level was ordered. However, phenytoin level was taken and patient is not on phenytoin.   PAST MEDICAL HISTORY:  Past Medical History:  Diagnosis Date  . Acute cystitis without hematuria   . Acute encephalopathy   . Arthritis   . Atrial fibrillation ([place])    on Eliquis Rx  . Atrial flutter ([place]) January, 2012  . Chronic anticoagulation   . Colon polyp   . Constipation   . Hearing difficulty   . Hemorrhoids   .  High cholesterol   . HLD (hyperlipidemia)   . Hypertension   . Left leg weakness 02/05/2013  . Left-sided weakness   . Malnutrition ([place])   . Migraine   . Mild cognitive impairment with memory loss   . Osteoporosis   . Seizures ([place]) 06/26/2016  . Stroke ([place]) 08/02/12    right lenticular nucleus infarct, 2 strokes  . TIA (transient ischemic attack)   . Torus palatinus      CURRENT MEDICATIONS: Reviewed  Patient's Medications  New Prescriptions   No medications on file  Previous Medications   ACETAMINOPHEN (TYLENOL) 325 MG TABLET    Take 650 mg by mouth every 4 (four) hours as needed for fever. For temp greater than 101.5 per SO   ACETAMINOPHEN (TYLENOL) 500 MG TABLET    Take 500 mg by mouth every 8  (eight) hours as needed.   ALBUTEROL (PROVENTIL) (2.5 MG/3ML) 0.083% NEBULIZER SOLUTION    Take 2.5 mg by nebulization 3 (three) times daily as needed for wheezing or shortness of breath.   APIXABAN (ELIQUIS) 5 MG TABS TABLET    Take 1 tablet (5 mg total) by mouth 2 (two) times daily.   ATORVASTATIN (LIPITOR) 40 MG TABLET    Take 1 tablet (40 mg total) by mouth daily at 6 PM.   B COMPLEX VITAMINS TABLET    Take 1 tablet by mouth 2 (two) times daily.    CALCIUM CARB-CHOLECALCIFEROL (CALCIUM 600 + D) 600-200 MG-UNIT TABS    Take 1 tablet by mouth 2 (two) times daily.   CETIRIZINE (ZYRTEC) 10 MG TABLET    Take 10 mg by mouth at bedtime.    CHOLECALCIFEROL (VITAMIN D3) 2000 UNITS TABS    Take 1 tablet by mouth daily.   CRANBERRY 250 MG CAPS    Take 2 capsules by mouth daily. Take 2 capsules to = 500 mg po qd   DIVALPROEX (DEPAKOTE SPRINKLE) 125 MG CAPSULE    Take 500 mg by mouth 2 (two) times daily.   HYDROCORTISONE (ANUSOL-HC) 2.5 % RECTAL CREAM    Place 1 application rectally as needed for hemorrhoids or itching.   HYDROCORTISONE (ANUSOL-HC) 25 MG SUPPOSITORY    Place 25 mg rectally as needed for hemorrhoids or itching.   MELATONIN 3 MG TABS    Take 1 tablet by mouth at bedtime    MULTIPLE VITAMINS-MINERALS (MULTIVITAMIN WITH MINERALS) TABLET    Take 1 tablet by mouth daily.   OLOPATADINE (PATANOL) 0.1 % OPHTHALMIC SOLUTION    PLACE 1 DROP INTO BOTH EYES 2 (TWO) TIMES DAILY.   ONDANSETRON (ZOFRAN) 4 MG TABLET    Take 1 tablet (4 mg total) by mouth every 6 (six) hours as needed for nausea.   PROTEIN (PROCEL) POWD    Take 1 scoop by mouth 2 (two) times daily.  Modified Medications   No medications on file  Discontinued Medications   No medications on file     Allergies  Allergen Reactions  . Aricept [Donepezil Hcl] Hypertension    With N/V  . Tramadol Hcl Other (See Comments)     lethargy, nausea  . Alendronate Sodium Rash and Other (See Comments)    puffy eyes     REVIEW OF  SYSTEMS:  GENERAL: no change in appetite, no fatigue, no weight changes, no fever, chills or weakness EYES: Denies change in vision, dry eyes, eye pain, itching or discharge EARS: Denies change in hearing, ringing in ears, or earache NOSE: Denies nasal congestion or epistaxis  MOUTH and THROAT: Denies oral discomfort, gingival pain or bleeding, pain from teeth or hoarseness   RESPIRATORY: no cough, SOB, DOE, wheezing, hemoptysis CARDIAC: no chest pain, edema or palpitations GI: no abdominal pain, diarrhea, constipation, heart burn, nausea or vomiting GU: Denies dysuria, frequency, hematuria, incontinence, or discharge PSYCHIATRIC: Denies feeling of depression or anxiety. No report of hallucinations, insomnia, paranoia, or agitation   PHYSICAL EXAMINATION  GENERAL APPEARANCE: Well nourished. In no acute distress. Normal body habitus SKIN:  Skin is warm and dry.  HEAD: Normal in size and contour. No evidence of trauma EYES: Lids open and close normally. No blepharitis, entropion or ectropion. PERRL. Conjunctivae are clear and sclerae are white. Lenses are without opacity EARS: Pinnae are normal. Patient hears normal voice tunes of the examiner MOUTH and THROAT: Lips are without lesions. Oral mucosa is moist and without lesions. Tongue is normal in shape, size, and color and without lesions NECK: supple, trachea midline, no neck masses, no thyroid tenderness, no thyromegaly LYMPHATICS: no LAN in the neck, no supraclavicular LAN RESPIRATORY: breathing is even & unlabored, BS CTAB CARDIAC: RRR, no murmur,no extra heart sounds, no edema GI: abdomen soft, normal BS, no masses, no tenderness, no hepatomegaly, no splenomegaly EXTREMITIES:  Able to move 4 extremities, left hemiparesis NEUROLOGICAL: There is no tremor. Speech is clear PSYCHIATRIC: Alert to self, disoriented to time and place. Affect and behavior are appropriate   LABS/RADIOLOGY: Labs reviewed: 08/09/16  WBC 5.2 hemoglobin 11.1  hematocrit 35.3 MCV 86.9 platelet 214 sodium 145 K4.3 glucose 92 BUN 13 creatinine 0.45 calcium 8.2 total protein 5.1 albumin 2.48 total bilirubin 0.30 alkaline phosphatase 78 SGOT 14 SGPT 7 GFR > 60 phenytoin level <2.4  Basic Metabolic Panel:  Recent Labs  09/12/15 1250  11/24/15 0416 11/25/15 0523 11/26/15 0420  04/01/16 04/06/16 04/28/16 07/08/16  NA 142  < > 143 141 139  < > 142 141 144 142  K 4.2  < > 3.8 4.3 4.4  < > 4.6  --  4.2 4.5  CL 104  < > 111 110 108  --   --   --   --   --   CO2 31  < > _0 --   --   --   --   --   GLUCOSE 89  < > 96 101* 114*  --   --   --   --   --   BUN 15  < > _1 < > _2 25*  CREATININE 0.72  < > 0.82 0.60 0.67  < > 0.6 0.4* 0.5 0.6  CALCIUM 9.1  < > 8.3* 8.3* 8.5*  --   --   --   --   --   MG 1.9  --   --   --   --   --   --   --   --   --   < > = values in this interval not displayed. Liver Function Tests:  Recent Labs  09/12/15 1250 11/23/15 2055 03/15/16  AST _3 ALT 11* 13* 19  ALKPHOS 49 61 112  BILITOT 0.4 0.4  --   PROT 6.4* 6.0*  --   ALBUMIN 3.4* 3.3*  --    CBC:  Recent Labs  11/23/15 2055  11/25/15 0523 11/26/15 0420  01/23/16  04/06/16 04/28/16 07/08/16  WBC 4.9  --  5.4 8.2  < > 5.9  < > 5.0 6.9  6.4  NEUTROABS 2.6  --   --   --   < > 4  --   --  5 4  HGB 12.3  < > 11.9* 12.3  < > 10.6*  < > 11.8* 11.6* 10.8*  HCT 38.9  < > 36.9 37.9  < > 33*  < > 36 37 34*  MCV 96.5  --  93.7 94.0  --   --   --   --   --   --   PLT 183  --  186 185  < > 209  < > 297 176 176  < > = values in this interval not displayed. Lipid Panel:  Recent Labs  11/24/15 0417 03/15/16  HDL 48 49   Cardiac Enzymes:  Recent Labs  09/12/15 1250  TROPONINI <0.03   CBG:  Recent Labs  11/23/15 2111  GLUCAP 154*    ASSESSMENT/PLAN:  Seizure disorder - Depakene level was not done instead phenytoin level done, will reorder Depakene level in a.m.; continued Depakote DR 125 mg 4 capsules = 500 mg by mouth twice a  day  Allergic rhinitis - continue Zyrtec 10 mg 1 tab by mouth daily at bedtime  History of CVA - continue Elequis 5 mg 1 tab by mouth twice a day, Lipitor 40 mg 1 tab by mouth daily  Atrial fibrillation - rate controlled; continue Eliquis 5 mg 1 tab PO BID  Hemorrhoids - continue Anusol HC 2.3% cream 1 application daily when necessary  Anemia of chronic disease - hgb  11.1, improved    Goals of care:  Long-term care   Maycel Riffe C. [place] - NP    [employer] [phone]

## 2016-08-10 DIAGNOSIS — R293 Abnormal posture: Secondary | ICD-10-CM | POA: Diagnosis not present

## 2016-08-10 DIAGNOSIS — M6281 Muscle weakness (generalized): Secondary | ICD-10-CM | POA: Diagnosis not present

## 2016-08-10 DIAGNOSIS — R488 Other symbolic dysfunctions: Secondary | ICD-10-CM | POA: Diagnosis not present

## 2016-08-12 DIAGNOSIS — R488 Other symbolic dysfunctions: Secondary | ICD-10-CM | POA: Diagnosis not present

## 2016-08-12 DIAGNOSIS — Z79899 Other long term (current) drug therapy: Secondary | ICD-10-CM | POA: Diagnosis not present

## 2016-08-12 DIAGNOSIS — R293 Abnormal posture: Secondary | ICD-10-CM | POA: Diagnosis not present

## 2016-08-12 DIAGNOSIS — M6281 Muscle weakness (generalized): Secondary | ICD-10-CM | POA: Diagnosis not present

## 2016-08-13 DIAGNOSIS — R293 Abnormal posture: Secondary | ICD-10-CM | POA: Diagnosis not present

## 2016-08-13 DIAGNOSIS — R488 Other symbolic dysfunctions: Secondary | ICD-10-CM | POA: Diagnosis not present

## 2016-08-13 DIAGNOSIS — M6281 Muscle weakness (generalized): Secondary | ICD-10-CM | POA: Diagnosis not present

## 2016-08-16 DIAGNOSIS — R488 Other symbolic dysfunctions: Secondary | ICD-10-CM | POA: Diagnosis not present

## 2016-08-16 DIAGNOSIS — R293 Abnormal posture: Secondary | ICD-10-CM | POA: Diagnosis not present

## 2016-08-16 DIAGNOSIS — M6281 Muscle weakness (generalized): Secondary | ICD-10-CM | POA: Diagnosis not present

## 2016-08-17 DIAGNOSIS — M6281 Muscle weakness (generalized): Secondary | ICD-10-CM | POA: Diagnosis not present

## 2016-08-17 DIAGNOSIS — R293 Abnormal posture: Secondary | ICD-10-CM | POA: Diagnosis not present

## 2016-08-17 DIAGNOSIS — R488 Other symbolic dysfunctions: Secondary | ICD-10-CM | POA: Diagnosis not present

## 2016-08-18 ENCOUNTER — Emergency Department (HOSPITAL_COMMUNITY): Payer: Medicare Other

## 2016-08-18 ENCOUNTER — Inpatient Hospital Stay (HOSPITAL_COMMUNITY): Payer: Medicare Other

## 2016-08-18 ENCOUNTER — Encounter (HOSPITAL_COMMUNITY): Payer: Self-pay

## 2016-08-18 ENCOUNTER — Inpatient Hospital Stay (HOSPITAL_COMMUNITY)
Admission: EM | Admit: 2016-08-18 | Discharge: 2016-08-22 | DRG: 193 | Disposition: A | Discharge status: Skilled Nursing Facility | Payer: Medicare Other | Attending: Internal Medicine | Admitting: Internal Medicine

## 2016-08-18 DIAGNOSIS — Z79899 Other long term (current) drug therapy: Secondary | ICD-10-CM | POA: Diagnosis not present

## 2016-08-18 DIAGNOSIS — I4892 Unspecified atrial flutter: Secondary | ICD-10-CM | POA: Diagnosis present

## 2016-08-18 DIAGNOSIS — J181 Lobar pneumonia, unspecified organism: Secondary | ICD-10-CM | POA: Diagnosis not present

## 2016-08-18 DIAGNOSIS — R488 Other symbolic dysfunctions: Secondary | ICD-10-CM | POA: Diagnosis not present

## 2016-08-18 DIAGNOSIS — Z7401 Bed confinement status: Secondary | ICD-10-CM | POA: Diagnosis not present

## 2016-08-18 DIAGNOSIS — Z885 Allergy status to narcotic agent status: Secondary | ICD-10-CM

## 2016-08-18 DIAGNOSIS — H9190 Unspecified hearing loss, unspecified ear: Secondary | ICD-10-CM | POA: Diagnosis present

## 2016-08-18 DIAGNOSIS — J189 Pneumonia, unspecified organism: Secondary | ICD-10-CM | POA: Diagnosis not present

## 2016-08-18 DIAGNOSIS — Z87891 Personal history of nicotine dependence: Secondary | ICD-10-CM

## 2016-08-18 DIAGNOSIS — I69354 Hemiplegia and hemiparesis following cerebral infarction affecting left non-dominant side: Secondary | ICD-10-CM | POA: Diagnosis not present

## 2016-08-18 DIAGNOSIS — Z87898 Personal history of other specified conditions: Secondary | ICD-10-CM | POA: Diagnosis not present

## 2016-08-18 DIAGNOSIS — G9340 Encephalopathy, unspecified: Secondary | ICD-10-CM | POA: Diagnosis present

## 2016-08-18 DIAGNOSIS — R1319 Other dysphagia: Secondary | ICD-10-CM | POA: Diagnosis not present

## 2016-08-18 DIAGNOSIS — G3184 Mild cognitive impairment, so stated: Secondary | ICD-10-CM | POA: Diagnosis present

## 2016-08-18 DIAGNOSIS — Y95 Nosocomial condition: Secondary | ICD-10-CM | POA: Diagnosis present

## 2016-08-18 DIAGNOSIS — I480 Paroxysmal atrial fibrillation: Secondary | ICD-10-CM | POA: Diagnosis present

## 2016-08-18 DIAGNOSIS — D68318 Other hemorrhagic disorder due to intrinsic circulating anticoagulants, antibodies, or inhibitors: Secondary | ICD-10-CM | POA: Diagnosis not present

## 2016-08-18 DIAGNOSIS — G3183 Dementia with Lewy bodies: Secondary | ICD-10-CM | POA: Diagnosis not present

## 2016-08-18 DIAGNOSIS — R32 Unspecified urinary incontinence: Secondary | ICD-10-CM | POA: Diagnosis present

## 2016-08-18 DIAGNOSIS — Z7901 Long term (current) use of anticoagulants: Secondary | ICD-10-CM | POA: Diagnosis not present

## 2016-08-18 DIAGNOSIS — Z888 Allergy status to other drugs, medicaments and biological substances status: Secondary | ICD-10-CM | POA: Diagnosis not present

## 2016-08-18 DIAGNOSIS — I10 Essential (primary) hypertension: Secondary | ICD-10-CM | POA: Diagnosis present

## 2016-08-18 DIAGNOSIS — F0280 Dementia in other diseases classified elsewhere without behavioral disturbance: Secondary | ICD-10-CM | POA: Diagnosis not present

## 2016-08-18 DIAGNOSIS — Z66 Do not resuscitate: Secondary | ICD-10-CM | POA: Diagnosis present

## 2016-08-18 DIAGNOSIS — R4701 Aphasia: Secondary | ICD-10-CM

## 2016-08-18 DIAGNOSIS — R278 Other lack of coordination: Secondary | ICD-10-CM | POA: Diagnosis not present

## 2016-08-18 DIAGNOSIS — I482 Chronic atrial fibrillation, unspecified: Secondary | ICD-10-CM

## 2016-08-18 DIAGNOSIS — M1990 Unspecified osteoarthritis, unspecified site: Secondary | ICD-10-CM | POA: Diagnosis present

## 2016-08-18 DIAGNOSIS — E785 Hyperlipidemia, unspecified: Secondary | ICD-10-CM | POA: Diagnosis present

## 2016-08-18 DIAGNOSIS — M810 Age-related osteoporosis without current pathological fracture: Secondary | ICD-10-CM | POA: Diagnosis present

## 2016-08-18 DIAGNOSIS — G40209 Localization-related (focal) (partial) symptomatic epilepsy and epileptic syndromes with complex partial seizures, not intractable, without status epilepticus: Secondary | ICD-10-CM | POA: Diagnosis present

## 2016-08-18 DIAGNOSIS — Z515 Encounter for palliative care: Secondary | ICD-10-CM | POA: Diagnosis not present

## 2016-08-18 DIAGNOSIS — R4182 Altered mental status, unspecified: Secondary | ICD-10-CM

## 2016-08-18 DIAGNOSIS — R9401 Abnormal electroencephalogram [EEG]: Secondary | ICD-10-CM | POA: Diagnosis present

## 2016-08-18 DIAGNOSIS — M6281 Muscle weakness (generalized): Secondary | ICD-10-CM | POA: Diagnosis not present

## 2016-08-18 DIAGNOSIS — Z8673 Personal history of transient ischemic attack (TIA), and cerebral infarction without residual deficits: Secondary | ICD-10-CM

## 2016-08-18 DIAGNOSIS — J323 Chronic sphenoidal sinusitis: Secondary | ICD-10-CM | POA: Diagnosis not present

## 2016-08-18 DIAGNOSIS — R402441 Other coma, without documented Glasgow coma scale score, or with partial score reported, in the field [EMT or ambulance]: Secondary | ICD-10-CM | POA: Diagnosis not present

## 2016-08-18 DIAGNOSIS — I4891 Unspecified atrial fibrillation: Secondary | ICD-10-CM | POA: Diagnosis not present

## 2016-08-18 LAB — VALPROIC ACID LEVEL
VALPROIC ACID LVL: 65 ug/mL (ref 50.0–100.0)
Valproic Acid Lvl: 55 ug/mL (ref 50.0–100.0)

## 2016-08-18 LAB — URINALYSIS, ROUTINE W REFLEX MICROSCOPIC
BACTERIA UA: NONE SEEN
Bilirubin Urine: NEGATIVE
Glucose, UA: NEGATIVE mg/dL
KETONES UR: NEGATIVE mg/dL
Leukocytes, UA: NEGATIVE
Nitrite: NEGATIVE
PH: 6 (ref 5.0–8.0)
Protein, ur: NEGATIVE mg/dL
SPECIFIC GRAVITY, URINE: 1.009 (ref 1.005–1.030)
SQUAMOUS EPITHELIAL / LPF: NONE SEEN

## 2016-08-18 LAB — CBC WITH DIFFERENTIAL/PLATELET
Basophils Absolute: 0 10*3/uL (ref 0.0–0.1)
Basophils Relative: 0 %
EOS ABS: 0 10*3/uL (ref 0.0–0.7)
Eosinophils Relative: 0 %
HCT: 35.2 % — ABNORMAL LOW (ref 36.0–46.0)
Hemoglobin: 11 g/dL — ABNORMAL LOW (ref 12.0–15.0)
LYMPHS ABS: 1.1 10*3/uL (ref 0.7–4.0)
LYMPHS PCT: 19 %
MCH: 26.4 pg (ref 26.0–34.0)
MCHC: 31.3 g/dL (ref 30.0–36.0)
MCV: 84.6 fL (ref 78.0–100.0)
MONO ABS: 1 10*3/uL (ref 0.1–1.0)
Monocytes Relative: 17 %
Neutro Abs: 3.6 10*3/uL (ref 1.7–7.7)
Neutrophils Relative %: 64 %
PLATELETS: 252 10*3/uL (ref 150–400)
RBC: 4.16 MIL/uL (ref 3.87–5.11)
RDW: 15.4 % (ref 11.5–15.5)
WBC: 5.7 10*3/uL (ref 4.0–10.5)

## 2016-08-18 LAB — COMPREHENSIVE METABOLIC PANEL
ALBUMIN: 2.1 g/dL — AB (ref 3.5–5.0)
ALT: 11 U/L — AB (ref 14–54)
AST: 30 U/L (ref 15–41)
Alkaline Phosphatase: 76 U/L (ref 38–126)
Anion gap: 7 (ref 5–15)
BILIRUBIN TOTAL: 0.8 mg/dL (ref 0.3–1.2)
BUN: 18 mg/dL (ref 6–20)
CHLORIDE: 104 mmol/L (ref 101–111)
CO2: 27 mmol/L (ref 22–32)
CREATININE: 0.66 mg/dL (ref 0.44–1.00)
Calcium: 8.5 mg/dL — ABNORMAL LOW (ref 8.9–10.3)
GFR calc Af Amer: >60 mL/min (ref >60)
GLUCOSE: 172 mg/dL — AB (ref 65–99)
POTASSIUM: 4.8 mmol/L (ref 3.5–5.1)
Sodium: 138 mmol/L (ref 135–145)
Total Protein: 5.5 g/dL — ABNORMAL LOW (ref 6.5–8.1)

## 2016-08-18 LAB — TSH: TSH: 1.302 u[IU]/mL (ref 0.350–4.500)

## 2016-08-18 LAB — AMMONIA: Ammonia: 26 umol/L (ref 9–35)

## 2016-08-18 MED ORDER — HYDROCORTISONE ACETATE 25 MG RE SUPP: 25.0000 mg | RECTAL | Status: DC | PRN

## 2016-08-18 MED ORDER — ATORVASTATIN CALCIUM 40 MG PO TABS
40.0000 mg | ORAL_TABLET | Freq: Every day | ORAL | Status: DC
  Administered 2016-08-19 – 2016-08-21 (×3): 40 mg via ORAL
  Filled 2016-08-18 (×3): qty 1

## 2016-08-18 MED ORDER — DEXTROSE 5 % IV SOLN
1.0000 g | Freq: Once | INTRAVENOUS | Status: AC
  Administered 2016-08-18: 1 g via INTRAVENOUS
  Filled 2016-08-18: qty 10

## 2016-08-18 MED ORDER — ADULT MULTIVITAMIN W/MINERALS CH
1.0000 | ORAL_TABLET | Freq: Every day | ORAL | Status: DC
  Administered 2016-08-18 – 2016-08-22 (×5): 1 via ORAL
  Filled 2016-08-18 (×6): qty 1

## 2016-08-18 MED ORDER — VITAMIN D 1000 UNITS PO TABS
2000.0000 [IU] | ORAL_TABLET | Freq: Every day | ORAL | Status: DC
  Administered 2016-08-19 – 2016-08-22 (×4): 2000 [IU] via ORAL
  Filled 2016-08-18 (×4): qty 2

## 2016-08-18 MED ORDER — VITAMIN D3 25 MCG (1000 UNIT) PO TABS
2000.0000 [IU] | ORAL_TABLET | Freq: Every day | ORAL | Status: DC
  Filled 2016-08-18: qty 2

## 2016-08-18 MED ORDER — SODIUM CHLORIDE 0.9 % IV SOLN
INTRAVENOUS | Status: AC
  Administered 2016-08-19: 01:00:00 via INTRAVENOUS

## 2016-08-18 MED ORDER — ACETAMINOPHEN 325 MG PO TABS
650.0000 mg | ORAL_TABLET | Freq: Four times a day (QID) | ORAL | Status: DC | PRN
  Administered 2016-08-21: 650 mg via ORAL
  Filled 2016-08-18: qty 2

## 2016-08-18 MED ORDER — APIXABAN 5 MG PO TABS
5.0000 mg | ORAL_TABLET | Freq: Two times a day (BID) | ORAL | Status: DC
  Administered 2016-08-18 – 2016-08-22 (×8): 5 mg via ORAL
  Filled 2016-08-18 (×8): qty 1

## 2016-08-18 MED ORDER — B COMPLEX-C PO TABS
1.0000 | ORAL_TABLET | Freq: Two times a day (BID) | ORAL | Status: DC
  Administered 2016-08-18 – 2016-08-22 (×8): 1 via ORAL
  Filled 2016-08-18 (×9): qty 1

## 2016-08-18 MED ORDER — ACETAMINOPHEN 650 MG RE SUPP: 650.0000 mg | Freq: Four times a day (QID) | RECTAL | Status: DC | PRN

## 2016-08-18 MED ORDER — AZITHROMYCIN 500 MG IV SOLR
500.0000 mg | Freq: Once | INTRAVENOUS | Status: AC
  Administered 2016-08-18: 500 mg via INTRAVENOUS
  Filled 2016-08-18: qty 500

## 2016-08-18 MED ORDER — ALBUTEROL SULFATE (2.5 MG/3ML) 0.083% IN NEBU: 2.5000 mg | INHALATION_SOLUTION | Freq: Three times a day (TID) | RESPIRATORY_TRACT | Status: DC | PRN

## 2016-08-18 MED ORDER — DIVALPROEX SODIUM 125 MG PO CSDR
500.0000 mg | DELAYED_RELEASE_CAPSULE | Freq: Two times a day (BID) | ORAL | Status: DC
  Administered 2016-08-18 – 2016-08-22 (×8): 500 mg via ORAL
  Filled 2016-08-18 (×8): qty 4

## 2016-08-18 MED ORDER — VANCOMYCIN HCL 500 MG IV SOLR
500.0000 mg | Freq: Two times a day (BID) | INTRAVENOUS | Status: DC
  Administered 2016-08-19 (×3): 500 mg via INTRAVENOUS
  Filled 2016-08-18 (×4): qty 500

## 2016-08-18 MED ORDER — DEXTROSE 5 % IV SOLN
2.0000 g | INTRAVENOUS | Status: DC
  Administered 2016-08-18 – 2016-08-19 (×2): 2 g via INTRAVENOUS
  Filled 2016-08-18 (×2): qty 2

## 2016-08-18 MED ORDER — OLOPATADINE HCL 0.1 % OP SOLN
1.0000 [drp] | Freq: Two times a day (BID) | OPHTHALMIC | Status: DC
  Administered 2016-08-19 – 2016-08-22 (×7): 1 [drp] via OPHTHALMIC
  Filled 2016-08-18 (×2): qty 5

## 2016-08-18 MED ORDER — LORATADINE 10 MG PO TABS
10.0000 mg | ORAL_TABLET | Freq: Every day | ORAL | Status: DC
  Administered 2016-08-18 – 2016-08-22 (×5): 10 mg via ORAL
  Filled 2016-08-18 (×5): qty 1

## 2016-08-18 MED ORDER — CALCIUM CARBONATE-VITAMIN D 500-200 MG-UNIT PO TABS
1.0000 | ORAL_TABLET | Freq: Two times a day (BID) | ORAL | Status: DC
  Administered 2016-08-18 – 2016-08-22 (×8): 1 via ORAL
  Filled 2016-08-18 (×9): qty 1

## 2016-08-18 NOTE — ED Provider Notes (Signed)
[facility] DEPT Provider Note   CSN: [id number] Arrival date & time: 08/18/16  1447     History   Chief Complaint Chief Complaint  Patient presents with  . Altered Mental Status    HPI [name] is a 83 y.o. female.  The history is provided by a relative. The history is limited by the condition of the patient (Altered mental status).  Altered Mental Status    She is a resident at a nursing home. Family has noted gradual deterioration in mentation over the last week. She got much worse today. During this time, she has been less responsive. Today, she was not even able to state her name. When family tried to ask her questions, she just laughs. She had similar deterioration last year which was from a stroke. She does have a left hemiparesis from prior stroke and his anticoagulated because of chronic atrial fibrillation. There is been no known trauma. She has not run a fever that anyone is aware of. She did have her dose of valproic acid increased following a seizure 2 months ago. There has been no recent valproic acid level checked. She has had several episodes of pneumonia. She has not been noted to have any coughing. She is incontinent of urine. She arrives with DO NOT RESUSCITATE paperwork.  Past Medical History:  Diagnosis Date  . Acute cystitis without hematuria   . Acute encephalopathy   . Arthritis   . Atrial fibrillation ([place])    on Eliquis Rx  . Atrial flutter ([place]) January, 2012  . Chronic anticoagulation   . Colon polyp   . Constipation   . Hearing difficulty   . Hemorrhoids   . High cholesterol   . HLD (hyperlipidemia)   . Hypertension   . Left leg weakness 02/05/2013  . Left-sided weakness   . Malnutrition ([place])   . Migraine   . Mild cognitive impairment with memory loss   . Osteoporosis   . Seizures ([place]) 06/26/2016  . Stroke ([place]) 08/02/12    right lenticular nucleus infarct, 2 strokes  . TIA (transient ischemic attack)   . Torus palatinus      Patient Active Problem List   Diagnosis Date Noted  . CVA (cerebral infarction) 11/23/2015  . Vascular dementia 10/12/2015  . OAB (overactive bladder) 10/12/2015  . Protein-calorie malnutrition ([place]) 10/12/2015  . Senile osteoporosis 10/10/2015  . Acute encephalopathy 08/12/2015  . Acute cystitis without hematuria   . HLD (hyperlipidemia)   . External hemorrhoids without complication 54/27/0623  . Abdominal pain 12/18/2014  . Nausea without vomiting 12/18/2014  . UTI (lower urinary tract infection) 12/18/2014  . Urinary tract infectious disease   . Chest pain 10/31/2014  . Nausea and vomiting 10/31/2014  . Nausea with vomiting   . Mild cognitive impairment with memory loss 07/25/2014  . Malnourished ([place]) 07/25/2014  . Recurrent UTI 08/07/2013  . History of CVA (cerebrovascular accident) 06/02/2013  . Unspecified constipation 03/16/2013  . Left-sided weakness 03/09/2013  . Possible Seizures 02/14/2013  . TIA (transient ischemic attack) 02/12/2013  . Hyperglycemia 02/12/2013  . High cholesterol   . Acute CVA (cerebrovascular accident) ([place]) 02/05/2013  . Paroxysmal atrial fibrillation ([place]) 02/05/2013  . Chronic anticoagulation 02/05/2013  . HTN (hypertension) 11/14/2012  . Right ureteral stone 09/15/2010  . Atrial flutter ([place]) 07/17/2010  . Osteopenia of the elderly 06/03/2009  . Migraine 02/05/2008  . CATARACTS, BILATERAL 02/05/2008  . DECREASED HEARING 02/05/2008  . GEN OSTEOARTHROSIS INVOLVING MULTIPLE SITES 02/05/2008  .  COLONIC POLYPS, HX OF 02/05/2008    Past Surgical History:  Procedure Laterality Date  . APPENDECTOMY  02/19/53   `  . CARDIOVERSION  05/19/2010   Dr. Einar Gip  . CATARACT EXTRACTION W/ INTRAOCULAR LENS  IMPLANT, BILATERAL  2006-2008  . GANGLION CYST EXCISION Bilateral 1938,1954,2003,2005   "wrists/hand" (08/01/2012)  . MOUTH SURGERY     [name]  . TONSILLECTOMY  ~ 1935    OB History    No data available       Home Medications     Prior to Admission medications   Medication Sig Start Date End Date Taking? Authorizing Provider  acetaminophen (TYLENOL) 500 MG tablet Take 500 mg by mouth every 8 (eight) hours as needed.   Yes [provider]  albuterol (PROVENTIL) (2.5 MG/3ML) 0.083% nebulizer solution Take 2.5 mg by nebulization 3 (three) times daily as needed for wheezing or shortness of breath.   Yes [provider]  apixaban (ELIQUIS) 5 MG TABS tablet Take 1 tablet (5 mg total) by mouth 2 (two) times daily. 11/26/15  Yes Ahmed, Chesley Mires, MD  atorvastatin (LIPITOR) 40 MG tablet Take 1 tablet (40 mg total) by mouth daily at 6 PM. 11/26/15  Yes Ahmed, Chesley Mires, MD  Calcium Carb-Cholecalciferol (CALCIUM 600 + D) 600-200 MG-UNIT TABS Take 1 tablet by mouth 2 (two) times daily.   Yes [provider]  Cholecalciferol (VITAMIN D3) 2000 units TABS Take 2,000 Units by mouth daily.    Yes [provider]  Cranberry 250 MG CAPS Take 2 capsules by mouth 2 (two) times daily. Take 2 capsules to = 500 mg po qd   Yes [provider]  divalproex (DEPAKOTE SPRINKLE) 125 MG capsule Take 500 mg by mouth 2 (two) times daily.   Yes [provider]  Multiple Vitamins-Minerals (MULTIVITAMIN WITH MINERALS) tablet Take 1 tablet by mouth daily.   Yes [provider]  olopatadine (PATANOL) 0.1 % ophthalmic solution PLACE 1 DROP INTO BOTH EYES 2 (TWO) TIMES DAILY. 10/27/15  Yes Gildardo Cranker, DO  acetaminophen (TYLENOL) 325 MG tablet Take 650 mg by mouth every 4 (four) hours as needed for fever. For temp greater than 101.5 per SO    [provider]  b complex vitamins tablet Take 1 tablet by mouth 2 (two) times daily.     [provider]  cetirizine (ZYRTEC) 10 MG tablet Take 10 mg by mouth at bedtime.     [provider]  hydrocortisone (ANUSOL-HC) 2.5 % rectal cream Place 1 application rectally as needed for hemorrhoids or itching.    [provider]   hydrocortisone (ANUSOL-HC) 25 MG suppository Place 25 mg rectally as needed for hemorrhoids or itching.    [provider]  Melatonin 3 MG TABS Take 1 tablet by mouth at bedtime     [provider]  ondansetron (ZOFRAN) 4 MG tablet Take 1 tablet (4 mg total) by mouth every 6 (six) hours as needed for nausea. 10/31/14   Delfina Redwood, MD  Protein (PROCEL) POWD Take 1 scoop by mouth 2 (two) times daily.    [provider]    Family History Family History  Problem Relation Age of Onset  . Cancer Mother        breast  . Aneurysm Mother        brain  . Stroke Mother   . Colon cancer Father   . Breast cancer Unknown   . Brain cancer Unknown   . Heart attack Neg  Hx   . Diabetes Neg Hx   . Hypertension Neg Hx     Social History Social History  Substance Use Topics  . Smoking status: Former Research scientist (life sciences)  . Smokeless tobacco: Never Used  . Alcohol use No     Comment: 08/01/2012 "glass of wine on special occasions"     Allergies   Aricept [donepezil hcl]; Tramadol hcl; and Alendronate sodium   Review of Systems Review of Systems  All other systems reviewed and are negative.    Physical Exam Updated Vital Signs BP (!) 137/57 (BP Location: Right Arm)   Pulse 82   Temp 98.4 F (36.9 C) (Oral)   Resp 16   SpO2 98%   Physical Exam  Nursing note and vitals reviewed.  83 year old female, resting comfortably and in no acute distress. Vital signs are  normal. Oxygen saturation is 98%, which is normal. Head is normocephalic and atraumatic. PERRLA, EOMI. Oropharynx is clear. eyes are slightly sunken. Neck is nontender and supple without adenopathy or JVD. Back is nontender and there is no CVA tenderness. Lungs are clear without rales, wheezes, or rhonchi. Chest is nontender. Heart has regular rate and rhythm without murmur. Abdomen is soft, flat, nontender without masses or hepatosplenomegaly and peristalsis is normoactive. Extremities have no cyanosis  or edema, full range of motion is present. Skin is warm and dry without rash. Neurologic:  She is awake and will respond to some verbal stimuli. She intermittently follows commands, but is completely nonverbal. At times, she laughs when questions are asked of her. There is a moderately dense left hemiparesis which had been present previously.  ED Treatments / Results  Labs (all labs ordered are listed, but only abnormal results are displayed) Labs Reviewed  COMPREHENSIVE METABOLIC PANEL - Abnormal; Notable for the following:       Result Value   Glucose, Bld 172 (*)    Calcium 8.5 (*)    Total Protein 5.5 (*)    Albumin 2.1 (*)    ALT 11 (*)    All other components within normal limits  CBC WITH DIFFERENTIAL/PLATELET - Abnormal; Notable for the following:    Hemoglobin 11.0 (*)    HCT 35.2 (*)    All other components within normal limits  URINALYSIS, ROUTINE W REFLEX MICROSCOPIC - Abnormal; Notable for the following:    Hgb urine dipstick SMALL (*)    All other components within normal limits  VALPROIC ACID LEVEL    EKG  EKG Interpretation  Date/Time:  Wednesday Aug 18 2016 15:02:13 EDT Ventricular Rate:  82 PR Interval:    QRS Duration: 96 QT Interval:  393 QTC Calculation: 459 R Axis:   39 Text Interpretation:  Atrial flutter Low voltage, precordial leads When compared with ECG of 11/23/2015, Atrial flutter has replaced Sinus rhythm Reconfirmed by Delora Fuel ([id number]) on 08/18/2016 7:30:07 PM       Radiology Ct Head Wo Contrast  Result Date: 08/18/2016 CLINICAL DATA:  Altered mental status EXAM: CT HEAD WITHOUT CONTRAST TECHNIQUE: Contiguous axial images were obtained from the base of the skull through the vertex without intravenous contrast. COMPARISON:  MRI 11/23/2015, CT brain 11/23/2015 FINDINGS: Brain: No definite acute territorial infarction, hemorrhage or intracranial mass is seen. Interim finding of left frontal lobe and parasagittal encephalomalacia at the site of  prior 2017 MRI demonstrated infarct. Additional old infarcts in the right basal ganglia, right cerebellum, and left posterior parietal lobe. Extensive periventricular and subcortical small vessel ischemic changes. Moderate atrophy.  Stable ventricular size. Vascular: No hyperdense vessels.  Carotid artery calcifications. Skull: No fracture or suspicious bone lesion. Fluid in the inferior mastoids. Sinuses/Orbits: Mucosal opacification of the sphenoid sinuses. No acute orbital abnormality. Bilateral lens extraction. Other: None IMPRESSION: 1. No definite CT evidence for acute intracranial abnormality. 2. Old bilateral multifocal infarcts. Extensive small vessel ischemic changes of the white matter. 3. Sphenoid sinus disease. Electronically Signed   By: Donavan Foil M.D.   On: 08/18/2016 18:25   Dg Chest Port 1 View  Result Date: 08/18/2016 CLINICAL DATA:  Altered mental status for 2 days EXAM: PORTABLE CHEST 1 VIEW COMPARISON:  09/12/2015 FINDINGS: Cardiac shadow is at the upper limits of normal in size. Aortic calcifications are again seen. Left basilar infiltrate with associated effusion is noted new from the prior exam. No bony abnormality is seen. IMPRESSION: Left basilar infiltrate with associated effusion. Electronically Signed   By: Inez Catalina M.D.   On: 08/18/2016 16:54    Procedures Procedures (including critical care time)  Medications Ordered in ED Medications  cefTRIAXone (ROCEPHIN) 1 g in dextrose 5 % 50 mL IVPB (not administered)  azithromycin (ZITHROMAX) 500 mg in dextrose 5 % 250 mL IVPB (not administered)     Initial Impression / Assessment and Plan / ED Course  I have reviewed the triage vital signs and the nursing notes.  Pertinent labs & imaging results that were available during my care of the patient were reviewed by me and considered in my medical decision making (see chart for details).  Altered mental status. Need to evaluate for occult infection. She will be sent for  chest x-ray and urinalysis will be checked. Need to check for OXY disturbance. Since she is anticoagulated, will send for CT of head to evaluate for possible occult bleed. Valproic acid level will be checked. Old records are reviewed confirming hospitalization for stroke last fall.  Valproic Acid level is therapeutic. Chest x-ray does show a left lower lobe pneumonia which is apparently the cause for her mental status change. No evidence of urinary tract infection. Renal function and electrolytes are normal. She is started on antibiotics for pneumonia. Case is discussed with Dr. Hal Hope of [facility] who agrees to admit the patient.  CHA2DS2/VAS Stroke Risk Points      6 >= 2 Points: High Risk  1 - 1.99 Points: Medium Risk  0 Points: Low Risk    The patient's score has not changed in the past year.:  No Change         Details    Note: External data might be a factor in metrics not marked with    Points Metrics   This score determines the patient's risk of having a stroke if the  patient has atrial fibrillation.       0 Has Congestive Heart Failure:  No   0 Has Vascular Disease:  No   1 Has Hypertension:  Yes   2 Age:  78   0 Has Diabetes:  No   2 Had Stroke:  Yes Had TIA:  Yes Had thromboembolism:  No   1 Female:  Yes          Final Clinical Impressions(s) / ED Diagnoses   Final diagnoses:  Community acquired pneumonia of left lower lobe of lung (HCC)  Altered mental status, unspecified altered mental status type  Chronic atrial fibrillation (HCC)  Chronic anticoagulation    New Prescriptions New Prescriptions   No medications on file  Delora Fuel, MD 74/94/49 [phone]

## 2016-08-18 NOTE — ED Notes (Signed)
Patient currently in MRI.  Will transport to the floor once MRI is complete

## 2016-08-18 NOTE — H&P (Signed)
History and Physical    [name] QBV:[id number] DOB: October 29, 1924 DOA: 08/18/2016  PCP: Gildardo Cranker, DO  Patient coming from: Nursing home.  Chief Complaint: Increasing lethargy.  HPI: [name] is a 83 y.o. female with history of atrial fibrillation, complex partial seizures and dementia bedbound was brought to the ER after patient's family found the patient was getting less conversant and increasingly lethargic. This was seen over the last 1 week. There was no seizure-like activity is noted. Patient had minimal cough. Patient is otherwise usually bedbound.   ED Course: In the ER patient remains afebrile. Does follow minimal commands and has passed stroke swallow. CT head followed by MRI of the brain was unremarkable for anything acute. Chest x-ray was showing infiltrates concerning for pneumonia and patient is being admitted for further management of healthcare associated pneumonia.  Review of Systems: As per HPI, rest all negative.   Past Medical History:  Diagnosis Date  . Acute cystitis without hematuria   . Acute encephalopathy   . Arthritis   . Atrial fibrillation ([place])    on Eliquis Rx  . Atrial flutter ([place]) January, 2012  . Chronic anticoagulation   . Colon polyp   . Constipation   . Hearing difficulty   . Hemorrhoids   . High cholesterol   . HLD (hyperlipidemia)   . Hypertension   . Left leg weakness 02/05/2013  . Left-sided weakness   . Malnutrition ([place])   . Migraine   . Mild cognitive impairment with memory loss   . Osteoporosis   . Seizures ([place]) 06/26/2016  . Stroke ([place]) 08/02/12    right lenticular nucleus infarct, 2 strokes  . TIA (transient ischemic attack)   . Torus palatinus     Past Surgical History:  Procedure Laterality Date  . APPENDECTOMY  02/19/53   `  . CARDIOVERSION  05/19/2010   Dr. Einar Gip  . CATARACT EXTRACTION W/ INTRAOCULAR LENS  IMPLANT, BILATERAL  2006-2008  . GANGLION CYST EXCISION Bilateral 1938,1954,2003,2005   "wrists/hand" (08/01/2012)  . MOUTH SURGERY     [name]  . TONSILLECTOMY  ~ Joen Laura     reports that she has quit smoking. She has never used smokeless tobacco. She reports that she does not drink alcohol or use drugs.  Allergies  Allergen Reactions  . Aricept [Donepezil Hcl] Hypertension    With N/V  . Tramadol Hcl Other (See Comments)     lethargy, nausea  . Alendronate Sodium Rash and Other (See Comments)    puffy eyes    Family History  Problem Relation Age of Onset  . Cancer Mother        breast  . Aneurysm Mother        brain  . Stroke Mother   . Colon cancer Father   . Breast cancer Unknown   . Brain cancer Unknown   . Heart attack Neg Hx   . Diabetes Neg Hx   . Hypertension Neg Hx     Prior to Admission medications   Medication Sig Start Date End Date Taking? Authorizing Provider  acetaminophen (TYLENOL) 500 MG tablet Take 500 mg by mouth every 8 (eight) hours as needed.   Yes [provider]  albuterol (PROVENTIL) (2.5 MG/3ML) 0.083% nebulizer solution Take 2.5 mg by nebulization 3 (three) times daily as needed for wheezing or shortness of breath.   Yes [provider]  apixaban (ELIQUIS) 5 MG TABS tablet Take 1 tablet (5 mg total) by mouth 2 (two)  times daily. 11/26/15  Yes Ahmed, Chesley Mires, MD  atorvastatin (LIPITOR) 40 MG tablet Take 1 tablet (40 mg total) by mouth daily at 6 PM. 11/26/15  Yes Ahmed, Chesley Mires, MD  b complex vitamins tablet Take 1 tablet by mouth 2 (two) times daily.    Yes [provider]  Calcium Carb-Cholecalciferol (CALCIUM 600 + D) 600-200 MG-UNIT TABS Take 1 tablet by mouth 2 (two) times daily.   Yes [provider]  cetirizine (ZYRTEC) 10 MG tablet Take 10 mg by mouth at bedtime.    Yes [provider]  Cholecalciferol (VITAMIN D3) 2000 units TABS Take 2,000 Units by mouth daily.    Yes [provider]  Cranberry 250 MG CAPS Take 2 capsules by mouth 2 (two) times daily. Take 2 capsules to = 500 mg po  qd   Yes [provider]  divalproex (DEPAKOTE SPRINKLE) 125 MG capsule Take 500 mg by mouth 2 (two) times daily.   Yes [provider]  hydrocortisone (ANUSOL-HC) 2.5 % rectal cream Place 1 application rectally as needed for hemorrhoids or itching.   Yes [provider]  hydrocortisone (ANUSOL-HC) 25 MG suppository Place 25 mg rectally as needed for hemorrhoids or itching.   Yes [provider]  Melatonin 3 MG TABS Take 3 mg by mouth at bedtime.    Yes [provider]  Multiple Vitamins-Minerals (MULTIVITAMIN WITH MINERALS) tablet Take 1 tablet by mouth daily.   Yes [provider]  olopatadine (PATANOL) 0.1 % ophthalmic solution PLACE 1 DROP INTO BOTH EYES 2 (TWO) TIMES DAILY. 10/27/15  Yes Eulas Post, Monica, DO  ondansetron (ZOFRAN) 4 MG tablet Take 1 tablet (4 mg total) by mouth every 6 (six) hours as needed for nausea. 10/31/14  Yes Delfina Redwood, MD    Physical Exam: Vitals:   08/18/16 1730 08/18/16 1800 08/18/16 1854 08/18/16 2111  BP: (!) 122/45 (!) 115/59 (!) 115/59   Pulse: 79 75 73   Resp: (!) 22 18 20    Temp:    98.6 F (37 C)  TempSrc:      SpO2: 97% 97%        Constitutional: Moderately built and nourished. Vitals:   08/18/16 1730 08/18/16 1800 08/18/16 1854 08/18/16 2111  BP: (!) 122/45 (!) 115/59 (!) 115/59   Pulse: 79 75 73   Resp: (!) 22 18 20    Temp:    98.6 F (37 C)  TempSrc:      SpO2: 97% 97%     Eyes: Anicteric no pallor. ENMT: No discharge from the ears eyes nose and mouth. Neck: No mass felt. No neck rigidity. No JVD appreciated. Respiratory: No rhonchi or crepitations. Cardiovascular: S1 and S2 heard. Abdomen: Soft nontender bowel sounds present. Musculoskeletal: Left upper extremity contracture. Skin: No rash. Neurologic: Alert awake not very communicative. Left-sided weakness from previous stroke. Pupils reacting to light. Psychiatric: Appears confused.   Labs on Admission: I have  personally reviewed following labs and imaging studies  CBC:  Recent Labs Lab 08/18/16 1641  WBC 5.7  NEUTROABS 3.6  HGB 11.0*  HCT 35.2*  MCV 84.6  PLT 878   Basic Metabolic Panel:  Recent Labs Lab 08/18/16 1641  NA 138  K 4.8  CL 104  CO2 27  GLUCOSE 172*  BUN 18  CREATININE 0.66  CALCIUM 8.5*   GFR: Estimated Creatinine Clearance: 44.6 mL/min (by C-G formula based on SCr of 0.66 mg/dL). Liver Function Tests:  Recent Labs Lab 08/18/16 1641  AST  30  ALT 11*  ALKPHOS 76  BILITOT 0.8  PROT 5.5*  ALBUMIN 2.1*   No results for input(s): LIPASE, AMYLASE in the last 168 hours. No results for input(s): AMMONIA in the last 168 hours. Coagulation Profile: No results for input(s): INR, PROTIME in the last 168 hours. Cardiac Enzymes: No results for input(s): CKTOTAL, CKMB, CKMBINDEX, TROPONINI in the last 168 hours. BNP (last 3 results) No results for input(s): PROBNP in the last 8760 hours. HbA1C: No results for input(s): HGBA1C in the last 72 hours. CBG: No results for input(s): GLUCAP in the last 168 hours. Lipid Profile: No results for input(s): CHOL, HDL, LDLCALC, TRIG, CHOLHDL, LDLDIRECT in the last 72 hours. Thyroid Function Tests: No results for input(s): TSH, T4TOTAL, FREET4, T3FREE, THYROIDAB in the last 72 hours. Anemia Panel: No results for input(s): VITAMINB12, FOLATE, FERRITIN, TIBC, IRON, RETICCTPCT in the last 72 hours. Urine analysis:    Component Value Date/Time   COLORURINE YELLOW 08/18/2016 1848   APPEARANCEUR CLEAR 08/18/2016 1848   LABSPEC 1.009 08/18/2016 1848   PHURINE 6.0 08/18/2016 1848   GLUCOSEU NEGATIVE 08/18/2016 1848   GLUCOSEU NEGATIVE 10/23/2014 1509   HGBUR SMALL (A) 08/18/2016 1848   BILIRUBINUR NEGATIVE 08/18/2016 1848   BILIRUBINUR neg 09/26/2015 1705   KETONESUR NEGATIVE 08/18/2016 1848   PROTEINUR NEGATIVE 08/18/2016 1848   UROBILINOGEN negative 09/26/2015 1705   UROBILINOGEN 0.2 12/18/2014 0225   NITRITE  NEGATIVE 08/18/2016 1848   LEUKOCYTESUR NEGATIVE 08/18/2016 1848   Sepsis Labs: @LABRCNTIP (procalcitonin:4,lacticidven:4) )No results found for this or any previous visit (from the past 240 hour(s)).   Radiological Exams on Admission: Ct Head Wo Contrast  Result Date: 08/18/2016 CLINICAL DATA:  Altered mental status EXAM: CT HEAD WITHOUT CONTRAST TECHNIQUE: Contiguous axial images were obtained from the base of the skull through the vertex without intravenous contrast. COMPARISON:  MRI 11/23/2015, CT brain 11/23/2015 FINDINGS: Brain: No definite acute territorial infarction, hemorrhage or intracranial mass is seen. Interim finding of left frontal lobe and parasagittal encephalomalacia at the site of prior 2017 MRI demonstrated infarct. Additional old infarcts in the right basal ganglia, right cerebellum, and left posterior parietal lobe. Extensive periventricular and subcortical small vessel ischemic changes. Moderate atrophy. Stable ventricular size. Vascular: No hyperdense vessels.  Carotid artery calcifications. Skull: No fracture or suspicious bone lesion. Fluid in the inferior mastoids. Sinuses/Orbits: Mucosal opacification of the sphenoid sinuses. No acute orbital abnormality. Bilateral lens extraction. Other: None IMPRESSION: 1. No definite CT evidence for acute intracranial abnormality. 2. Old bilateral multifocal infarcts. Extensive small vessel ischemic changes of the white matter. 3. Sphenoid sinus disease. Electronically Signed   By: Donavan Foil M.D.   On: 08/18/2016 18:25   Mr Brain Wo Contrast  Result Date: 08/18/2016 CLINICAL DATA:  Aphasia and altered mental status EXAM: MRI HEAD WITHOUT CONTRAST TECHNIQUE: Multiplanar, multiecho pulse sequences of the brain and surrounding structures were obtained without intravenous contrast. COMPARISON:  Head CT 08/18/2016 Brain MRI 11/23/2015 FINDINGS: Brain: The midline structures are normal. There is no focal diffusion restriction to indicate  acute infarct. There is diffuse confluent hyperintense T2-weighted signal within the periventricular and deep white matter, most often seen in the setting of chronic microvascular ischemia. There is an old left anterior cerebral artery distribution infarct. There is also a small, old right cerebellar infarct. No intraparenchymal hematoma or chronic microhemorrhage. There is generalized brain volume loss without a specific lobar predilection. The dura is normal and there is no extra-axial collection. Vascular: Major intracranial arterial and  venous sinus flow voids are preserved. Skull and upper cervical spine: The visualized skull base, calvarium, upper cervical spine and extracranial soft tissues are normal. Sinuses/Orbits: Left sphenoethmoid retained secretions. Normal orbits. IMPRESSION: 1. Advanced chronic microvascular ischemia and multiple old infarcts without acute intracranial abnormality. 2. Diffuse atrophy without a specific lobar predominance. Electronically Signed   By: Ulyses Jarred M.D.   On: 08/18/2016 21:09   Dg Chest Port 1 View  Result Date: 08/18/2016 CLINICAL DATA:  Altered mental status for 2 days EXAM: PORTABLE CHEST 1 VIEW COMPARISON:  09/12/2015 FINDINGS: Cardiac shadow is at the upper limits of normal in size. Aortic calcifications are again seen. Left basilar infiltrate with associated effusion is noted new from the prior exam. No bony abnormality is seen. IMPRESSION: Left basilar infiltrate with associated effusion. Electronically Signed   By: Inez Catalina M.D.   On: 08/18/2016 16:54     Assessment/Plan Principal Problem:   HCAP (healthcare-associated pneumonia) Active Problems:   Paroxysmal atrial fibrillation (HCC)   History of CVA (cerebrovascular accident)   History of seizure    1. Acute encephalopathy from healthcare associated pneumonia - patient is placed on vancomycin and cefepime for healthcare associated pneumonia. Follow cultures. 2. Chronic atrial  fibrillation - patient is on Apixaban. Chads 2 vasc score is more than 2. Presently rate controlled. 3. History of complex partial seizures - no signs of active seizures since patient does follow commands. Will check valproic levels and ammonia since patient is encephalopathic. Did discuss with neurologist. 4. History of previous stroke with left-sided hemiparesis. 5. History of dementia and bedbound.   DVT prophylaxis: Apixaban. Code Status: DO NOT RESUSCITATE.  Family Communication: Patient's daughters.  Disposition Plan: Back to nursing facility.  Consults called: None.  Admission status: Inpatient.    Rise Patience MD [facility] Pager [phone].  If 7PM-7AM, please contact night-coverage [email or web address] Password [email or web address]  08/18/2016, 9:30 PM

## 2016-08-18 NOTE — Progress Notes (Signed)
  Pt admitted to the unit. Pt is stable, alert and oriented per baseline. Oriented to room, staff, and call bell. Educated to call for any assistance. Bed in lowest position, call bell within reach- will continue to monitor. 

## 2016-08-18 NOTE — ED Triage Notes (Signed)
Pt presents via [facility] for evaluation of AMS x 2 days. Family reports worsened over past 3 hours. Reports hx of CVA with L sided deficits in October 2017. Pt lives at [facility]. Alert and oriented to baseline.

## 2016-08-18 NOTE — Progress Notes (Signed)
Pharmacy Antibiotic Note  [name] is a 83 y.o. female admitted on 08/18/2016 with pneumonia.  Pharmacy has been consulted for vancomycin dosing.  CrCl ~45  Vancomycin trough 15-20  Plan: - vancomycin 500 mg iv q12h - monitor renal function - check vancomycin trough when it's appropriate     Temp (24hrs), Avg:98.5 F (36.9 C), Min:98.4 F (36.9 C), Max:98.6 F (37 C)   Recent Labs Lab 08/18/16 1641  WBC 5.7  CREATININE 0.66    Estimated Creatinine Clearance: 44.6 mL/min (by C-G formula based on SCr of 0.66 mg/dL).    Allergies  Allergen Reactions  . Aricept [Donepezil Hcl] Hypertension    With N/V  . Tramadol Hcl Other (See Comments)     lethargy, nausea  . Alendronate Sodium Rash and Other (See Comments)    puffy eyes     Thank you for allowing pharmacy to be a part of this patient's care.  Chamika Cunanan, Tsz-Yin 08/18/2016 9:34 PM

## 2016-08-19 ENCOUNTER — Inpatient Hospital Stay (HOSPITAL_COMMUNITY): Payer: Medicare Other

## 2016-08-19 DIAGNOSIS — J189 Pneumonia, unspecified organism: Principal | ICD-10-CM

## 2016-08-19 DIAGNOSIS — R4182 Altered mental status, unspecified: Secondary | ICD-10-CM

## 2016-08-19 LAB — CBC WITH DIFFERENTIAL/PLATELET
Basophils Absolute: 0 10*3/uL (ref 0.0–0.1)
Basophils Relative: 1 %
Eosinophils Absolute: 0.1 10*3/uL (ref 0.0–0.7)
Eosinophils Relative: 2 %
HCT: 35.5 % — ABNORMAL LOW (ref 36.0–46.0)
Hemoglobin: 10.9 g/dL — ABNORMAL LOW (ref 12.0–15.0)
LYMPHS ABS: 1.7 10*3/uL (ref 0.7–4.0)
LYMPHS PCT: 30 %
MCH: 26.3 pg (ref 26.0–34.0)
MCHC: 30.7 g/dL (ref 30.0–36.0)
MCV: 85.5 fL (ref 78.0–100.0)
MONO ABS: 1 10*3/uL (ref 0.1–1.0)
MONOS PCT: 19 %
NEUTROS ABS: 2.7 10*3/uL (ref 1.7–7.7)
Neutrophils Relative %: 48 %
Platelets: 229 10*3/uL (ref 150–400)
RBC: 4.15 MIL/uL (ref 3.87–5.11)
RDW: 15.7 % — AB (ref 11.5–15.5)
WBC: 5.6 10*3/uL (ref 4.0–10.5)

## 2016-08-19 LAB — COMPREHENSIVE METABOLIC PANEL
ALBUMIN: 2.2 g/dL — AB (ref 3.5–5.0)
ALK PHOS: 67 U/L (ref 38–126)
ALT: 11 U/L — ABNORMAL LOW (ref 14–54)
ANION GAP: 5 (ref 5–15)
AST: 21 U/L (ref 15–41)
BUN: 14 mg/dL (ref 6–20)
CO2: 31 mmol/L (ref 22–32)
Calcium: 8.4 mg/dL — ABNORMAL LOW (ref 8.9–10.3)
Chloride: 106 mmol/L (ref 101–111)
Creatinine, Ser: 0.62 mg/dL (ref 0.44–1.00)
GFR calc Af Amer: >60 mL/min (ref >60)
GFR calc non Af Amer: >60 mL/min (ref >60)
GLUCOSE: 93 mg/dL (ref 65–99)
POTASSIUM: 3.7 mmol/L (ref 3.5–5.1)
SODIUM: 142 mmol/L (ref 135–145)
Total Bilirubin: 0.3 mg/dL (ref 0.3–1.2)
Total Protein: 5.3 g/dL — ABNORMAL LOW (ref 6.5–8.1)

## 2016-08-19 LAB — MRSA PCR SCREENING: MRSA by PCR: INVALID — AB

## 2016-08-19 NOTE — Evaluation (Signed)
Physical Therapy Evaluation Patient Details Name: [name] MRN: [id number] DOB: [birth year] Today's Date: 08/19/2016   History of Present Illness  Pt is an 83 y/o female admitted secondary to pnuemonia. MRI showed no acute changes. PMH includes 3 previous CVA with L sided weakness, a fib, partial seizures, dementia, osteoporosis.   Clinical Impression  Pt admitted secondary to problem above with deficits below. PTA, pt was total assist for transfers, however, pt family reports PT had signed back on at SNF to work on sitting balance and upright posture to be able to sit in [place]. Upon evaluation, pt limited by back pain and unable to tolerate bed mobility. Educated pt family about how to assist with LE and UE exercise to maintain ROM. Will continue to follow and address sitting balance to improve OOB tolerance.     Follow Up Recommendations SNF    Equipment Recommendations  None recommended by PT    Recommendations for Other Services       Precautions / Restrictions Precautions Precautions: Fall Restrictions Weight Bearing Restrictions: No      Mobility  Bed Mobility Overal bed mobility: Needs Assistance Bed Mobility: Rolling Rolling: Max assist         General bed mobility comments: Max A for rolling. Pt requesting to stop rolling secondary to back pain.   Transfers                 General transfer comment: Not attempted; pt total assist at baseline   Ambulation/Gait             General Gait Details: bed bound; Per daughters pt did get up to [facility] at [facility] via hoyer or total assisst.   Stairs            Wheelchair Mobility    Modified Rankin (Stroke Patients Only)       Balance                                             Pertinent Vitals/Pain Pain Assessment: Faces Faces Pain Scale: Hurts little more Pain Location: back  Pain Descriptors / Indicators: Aching;Grimacing Pain Intervention(s): Limited activity within  patient's tolerance;Monitored during session;Repositioned    Home Living Family/patient expects to be discharged to:: Skilled nursing facility                 Additional Comments: [facility]    Prior Function Level of Independence: Needs assistance   Gait / Transfers Assistance Needed: Per daughters, total assist to get to chair, but PT was working on sitting balance at [facility] so that pt could maintain upright in WC.   ADL's / Homemaking Assistance Needed: Total assist         Hand Dominance   Dominant Hand: Right    Extremity/Trunk Assessment   Upper Extremity Assessment Upper Extremity Assessment: LUE deficits/detail;Difficult to assess due to impaired cognition LUE Deficits / Details: contractures in L hand; increased tone noted in biceps as well.     Lower Extremity Assessment Lower Extremity Assessment: LLE deficits/detail;Difficult to assess due to impaired cognition LLE Deficits / Details: weakness from previous stroke     Cervical / Trunk Assessment Cervical / Trunk Assessment: Kyphotic  Communication   Communication: HOH  Cognition Arousal/Alertness: Awake/alert Behavior During Therapy: WFL for tasks assessed/performed Overall Cognitive Status: History of cognitive impairments - at baseline  General Comments: Baseline of dementia       General Comments General comments (skin integrity, edema, etc.): Pt's daughters present throughout session. Educated pt's daughters about performing ROM and exercises with pt to maintain ROM.     Exercises General Exercises - Upper Extremity Shoulder Flexion: AROM;Right;AAROM;Left;10 reps;Supine Elbow Flexion: PROM;Left;10 reps;Supine General Exercises - Lower Extremity Ankle Circles/Pumps: AROM;Both;10 reps;Supine Heel Slides: AAROM;Both;10 reps;Supine Hip ABduction/ADduction: AAROM;Both;10 reps;Supine   Assessment/Plan    PT Assessment Patient needs  continued PT services  PT Problem List Decreased strength;Decreased range of motion;Decreased activity tolerance;Decreased balance;Decreased mobility;Decreased cognition;Decreased knowledge of use of DME;Decreased safety awareness;Impaired tone;Pain;Decreased knowledge of precautions       PT Treatment Interventions Functional mobility training;Therapeutic activities;Therapeutic exercise;Balance training;Neuromuscular re-education;Patient/family education;Cognitive remediation    PT Goals (Current goals can be found in the Care Plan section)  Acute Rehab PT Goals Patient Stated Goal: unable to state PT Goal Formulation: With patient/family Time For Goal Achievement: 08/26/16 Potential to Achieve Goals: Fair    Frequency Min 2X/week   Barriers to discharge        Co-evaluation               AM-PAC PT "6 Clicks" Daily Activity  Outcome Measure Difficulty turning over in bed (including adjusting bedclothes, sheets and blankets)?: Total Difficulty moving from lying on back to sitting on the side of the bed? : Total Difficulty sitting down on and standing up from a chair with arms (e.g., wheelchair, bedside commode, etc,.)?: Total Help needed moving to and from a bed to chair (including a wheelchair)?: Total Help needed walking in hospital room?: Total Help needed climbing 3-5 steps with a railing? : Total 6 Click Score: 6    End of Session   Activity Tolerance: Patient limited by pain Patient left: in bed;with call bell/phone within reach;with bed alarm set;with family/visitor present Nurse Communication: Mobility status PT Visit Diagnosis: Muscle weakness (generalized) (M62.81);Hemiplegia and hemiparesis Hemiplegia - Right/Left: Left Hemiplegia - dominant/non-dominant: Non-dominant Hemiplegia - caused by: Cerebral infarction    Time: 9371-6967 PT Time Calculation (min) (ACUTE ONLY): 22 min   Charges:   PT Evaluation $PT Eval Moderate Complexity: 1 Procedure PT  Treatments $Therapeutic Exercise: 8-22 mins   PT G Codes:        Nicky Pugh, PT, DPT  Acute Rehabilitation Services  Pager: [phone]   Army Melia 08/19/2016, 4:59 PM

## 2016-08-19 NOTE — Progress Notes (Signed)
Per SWOT RN- MD placed a swallow screen for pt but she is not here for stroke work-up. Both this RN and Development worker, community spoke with charge RN and decided that because patient was also placed on a heart healthy diet- to try giving her some medications in applesauce. Patient took meds and applesauce without any issues. She is drinking and eating without problems. Will continue to monitor

## 2016-08-19 NOTE — Progress Notes (Signed)
EEG Completed; Results Pending  

## 2016-08-19 NOTE — Progress Notes (Signed)
[facility] Progress Note  Patient: [name] QQP:[id number]   PCP: Gildardo Cranker, DO DOB: Feb 26, 1925   DOA: 08/18/2016   DOS: 08/19/2016   Date of Service: the patient was seen and examined on 08/19/2016  Subjective: feeling better, still confused, disoriented  Brief hospital course: Pt. with PMH of A fib, seizures, dementia; admitted on 08/18/2016, presented with complaint of Confusion, was found to have has considered pneumonia. Currently further plan is monitor cultures and continue IV antibiotics.  Assessment and Plan:  1. Acute encephalopathy from healthcare associated pneumonia - patient is placed on vancomycin and cefepime for healthcare associated pneumonia. Follow cultures. 2. Chronic atrial fibrillation - patient is on Apixaban. Chads 2 vasc score is more than 2. Presently rate controlled. 3. History of complex partial seizures - no signs of active seizures since patient does follow commands. Will check valproic levels and ammonia since patient is encephalopathic. Did discuss with neurologist. 4. History of previous stroke with left-sided hemiparesis. 5. History of dementia and bedbound.  Diet: Cardiac diet DVT Prophylaxis: subcutaneous Heparin  Advance goals of care discussion: DNR DNI  Family Communication: family was present at bedside, at the time of interview. The pt provided permission to discuss medical plan with the family. Opportunity was given to ask question and all questions were answered satisfactorily.   Disposition:  Discharge to home.  Consultants: none Procedures: nine  Antibiotics: Anti-infectives    Start     Dose/Rate Route Frequency Ordered Stop   08/18/16 2200  ceFEPIme (MAXIPIME) 2 g in dextrose 5 % 50 mL IVPB     2 g 100 mL/hr over 30 Minutes Intravenous Every 24 hours 08/18/16 2130 08/26/16 2159   08/18/16 2200  vancomycin (VANCOCIN) 500 mg in sodium chloride 0.9 % 100 mL IVPB     500 mg 100 mL/hr over 60 Minutes Intravenous Every 12  hours 08/18/16 2136     08/18/16 1800  cefTRIAXone (ROCEPHIN) 1 g in dextrose 5 % 50 mL IVPB     1 g 100 mL/hr over 30 Minutes Intravenous  Once 08/18/16 1759 08/18/16 1940   08/18/16 1800  azithromycin (ZITHROMAX) 500 mg in dextrose 5 % 250 mL IVPB     500 mg 250 mL/hr over 60 Minutes Intravenous  Once 08/18/16 1759 08/18/16 2058       Objective: Physical Exam: Vitals:   08/19/16 0105 08/19/16 0201 08/19/16 0503 08/19/16 1354  BP: (!) 148/59  (!) 126/56 (!) 154/73  Pulse: 63  69 83  Resp: 18  18 17   Temp: 97.7 F (36.5 C)  98 F (36.7 C) 97.7 F (36.5 C)  TempSrc: Oral  Oral Oral  SpO2: 98%  99% 100%  Weight:  68.1 kg (150 lb 2.1 oz)    Height:        Intake/Output Summary (Last 24 hours) at 08/19/16 1915 Last data filed at 08/19/16 1801  Gross per 24 hour  Intake          1721.25 ml  Output                0 ml  Net          1721.25 ml   Filed Weights   08/18/16 2111 08/19/16 0201  Weight: 68.1 kg (150 lb 1.6 oz) 68.1 kg (150 lb 2.1 oz)   General: Alert, Awake and Oriented to Person. Appear in mild distress, affect appropriate Eyes: PERRL, Conjunctiva normal ENT: Oral Mucosa clear moist. Neck: no JVD, no Abnormal Mass  Or lumps Cardiovascular: S1 and S2 Present, no Murmur, Respiratory: Bilateral Air entry equal and Decreased, no use of accessory muscle, Clear to Auscultation, no Crackles, no wheezes Abdomen: Bowel Sound present, Soft and no tenderness Skin: no redness, no Rash, no induration Extremities: no Pedal edema, no calf tenderness Neurologic: Grossly no focal neuro deficit. Bilaterally Equal motor strength, appears forgetful   Data Reviewed: CBC:  Recent Labs Lab 08/18/16 1641 08/19/16 0440  WBC 5.7 5.6  NEUTROABS 3.6 2.7  HGB 11.0* 10.9*  HCT 35.2* 35.5*  MCV 84.6 85.5  PLT 252 353   Basic Metabolic Panel:  Recent Labs Lab 08/18/16 1641 08/19/16 0440  NA 138 142  K 4.8 3.7  CL 104 106  CO2 27 31  GLUCOSE 172* 93  BUN 18 14    CREATININE 0.66 0.62  CALCIUM 8.5* 8.4*    Liver Function Tests:  Recent Labs Lab 08/18/16 1641 08/19/16 0440  AST 30 21  ALT 11* 11*  ALKPHOS 76 67  BILITOT 0.8 0.3  PROT 5.5* 5.3*  ALBUMIN 2.1* 2.2*   No results for input(s): LIPASE, AMYLASE in the last 168 hours.  Recent Labs Lab 08/18/16 2202  AMMONIA 26   Coagulation Profile: No results for input(s): INR, PROTIME in the last 168 hours. Cardiac Enzymes: No results for input(s): CKTOTAL, CKMB, CKMBINDEX, TROPONINI in the last 168 hours. BNP (last 3 results) No results for input(s): PROBNP in the last 8760 hours. CBG: No results for input(s): GLUCAP in the last 168 hours. Studies: Mr Brain 77 Contrast  Result Date: 08/18/2016 CLINICAL DATA:  Aphasia and altered mental status EXAM: MRI HEAD WITHOUT CONTRAST TECHNIQUE: Multiplanar, multiecho pulse sequences of the brain and surrounding structures were obtained without intravenous contrast. COMPARISON:  Head CT 08/18/2016 Brain MRI 11/23/2015 FINDINGS: Brain: The midline structures are normal. There is no focal diffusion restriction to indicate acute infarct. There is diffuse confluent hyperintense T2-weighted signal within the periventricular and deep white matter, most often seen in the setting of chronic microvascular ischemia. There is an old left anterior cerebral artery distribution infarct. There is also a small, old right cerebellar infarct. No intraparenchymal hematoma or chronic microhemorrhage. There is generalized brain volume loss without a specific lobar predilection. The dura is normal and there is no extra-axial collection. Vascular: Major intracranial arterial and venous sinus flow voids are preserved. Skull and upper cervical spine: The visualized skull base, calvarium, upper cervical spine and extracranial soft tissues are normal. Sinuses/Orbits: Left sphenoethmoid retained secretions. Normal orbits. IMPRESSION: 1. Advanced chronic microvascular ischemia and  multiple old infarcts without acute intracranial abnormality. 2. Diffuse atrophy without a specific lobar predominance. Electronically Signed   By: Ulyses Jarred M.D.   On: 08/18/2016 21:09    Scheduled Meds: . apixaban  5 mg Oral BID  . atorvastatin  40 mg Oral q1800  . B-complex with vitamin C  1 tablet Oral BID  . calcium-vitamin D  1 tablet Oral BID  . cholecalciferol  2,000 Units Oral Daily  . divalproex  500 mg Oral BID  . loratadine  10 mg Oral Daily  . multivitamin with minerals  1 tablet Oral Daily  . olopatadine  1 drop Both Eyes BID   Continuous Infusions: . sodium chloride 75 mL/hr at 08/19/16 0052  . ceFEPime (MAXIPIME) IV Stopped (08/18/16 2328)  . vancomycin Stopped (08/19/16 1026)   PRN Meds: acetaminophen **OR** acetaminophen, albuterol, hydrocortisone  Time spent: 30 minutes  Author: Berle Mull, MD [facility] Pager: [phone]  08/19/2016 7:15 PM  If 7PM-7AM, please contact night-coverage at [email or web address], password [facility]

## 2016-08-19 NOTE — NC FL2 (Signed)
[place] LEVEL OF CARE SCREENING TOOL     IDENTIFICATION  Patient Name: [name] Birthdate: [birth year] Sex: female Admission Date (Current Location): 08/18/2016  [facility] and [place] Number:  Herbalist and Address:  [facility] [place], [place] [address] [place], [place] [address]      Provider Number: [id number]  Attending Physician Name and Address:  Lavina Hamman, MD  Relative Name and Phone Number:  [name] daughter, [phone]    Current Level of Care: Hospital Recommended Level of Care: [place] Prior Approval Number:    Date Approved/Denied:   PASRR Number: [id number]  Discharge Plan: SNF    Current Diagnoses: Patient Active Problem List   Diagnosis Date Noted  . HCAP (healthcare-associated pneumonia) 08/18/2016  . History of seizure 08/18/2016  . Chronic atrial fibrillation ([place])   . CVA (cerebral infarction) 11/23/2015  . Vascular dementia 10/12/2015  . OAB (overactive bladder) 10/12/2015  . Protein-calorie malnutrition ([place]) 10/12/2015  . Senile osteoporosis 10/10/2015  . Acute encephalopathy 08/12/2015  . Acute cystitis without hematuria   . HLD (hyperlipidemia)   . External hemorrhoids without complication 02/40/9735  . Abdominal pain 12/18/2014  . Nausea without vomiting 12/18/2014  . UTI (lower urinary tract infection) 12/18/2014  . Urinary tract infectious disease   . Chest pain 10/31/2014  . Nausea and vomiting 10/31/2014  . Nausea with vomiting   . Mild cognitive impairment with memory loss 07/25/2014  . Malnourished ([place]) 07/25/2014  . Recurrent UTI 08/07/2013  . History of CVA (cerebrovascular accident) 06/02/2013  . Unspecified constipation 03/16/2013  . Left-sided weakness 03/09/2013  . Possible Seizures 02/14/2013  . TIA (transient ischemic attack) 02/12/2013  . Hyperglycemia 02/12/2013  . High cholesterol   . Acute CVA (cerebrovascular accident) ([place]) 02/05/2013  .  Paroxysmal atrial fibrillation ([place]) 02/05/2013  . Chronic anticoagulation 02/05/2013  . HTN (hypertension) 11/14/2012  . Right ureteral stone 09/15/2010  . Atrial flutter ([place]) 07/17/2010  . Osteopenia of the elderly 06/03/2009  . Migraine 02/05/2008  . CATARACTS, BILATERAL 02/05/2008  . DECREASED HEARING 02/05/2008  . GEN OSTEOARTHROSIS INVOLVING MULTIPLE SITES 02/05/2008  . COLONIC POLYPS, HX OF 02/05/2008    Orientation RESPIRATION BLADDER Height & Weight      (Disoriented)  Normal Incontinent Weight: 68.1 kg (150 lb 2.1 oz) Height:  5\' 4"  (162.6 cm)  BEHAVIORAL SYMPTOMS/MOOD NEUROLOGICAL BOWEL NUTRITION STATUS      Continent Diet (Please see DC Summary)  AMBULATORY STATUS COMMUNICATION OF NEEDS Skin   Extensive Assist Verbally Normal                       Personal Care Assistance Level of Assistance  Bathing, Feeding, Dressing Bathing Assistance: Maximum assistance Feeding assistance: Limited assistance Dressing Assistance: Maximum assistance     Functional Limitations Info             SPECIAL CARE FACTORS FREQUENCY                       Contractures      Additional Factors Info  Code Status, Allergies Code Status Info: DNR Allergies Info: Aricept Donepezil Hcl, Tramadol Hcl, Alendronate Sodium           Current Medications (08/19/2016):  This is the current hospital active medication list Current Facility-Administered Medications  Medication Dose Route Frequency Provider Last Rate Last Dose  . 0.9 %  sodium chloride infusion   Intravenous Continuous  Rise Patience, MD 75 mL/hr at 08/19/16 [phone]    . acetaminophen (TYLENOL) tablet 650 mg  650 mg Oral Q6H PRN Rise Patience, MD       Or  . acetaminophen (TYLENOL) suppository 650 mg  650 mg Rectal Q6H PRN Rise Patience, MD      . albuterol (PROVENTIL) (2.5 MG/3ML) 0.083% nebulizer solution 2.5 mg  2.5 mg Nebulization TID PRN Rise Patience, MD      . apixaban Arne [place])  tablet 5 mg  5 mg Oral BID Rise Patience, MD   5 mg at 08/19/16 0109  . atorvastatin (LIPITOR) tablet 40 mg  40 mg Oral q1800 Rise Patience, MD      . B-complex with vitamin C tablet 1 tablet  1 tablet Oral BID Rise Patience, MD   1 tablet at 08/19/16 3235  . calcium-vitamin D (OSCAL WITH D) 500-200 MG-UNIT per tablet 1 tablet  1 tablet Oral BID Rise Patience, MD   1 tablet at 08/19/16 5732  . ceFEPIme (MAXIPIME) 2 g in dextrose 5 % 50 mL IVPB  2 g Intravenous Q24H Rise Patience, MD   Stopped at 08/18/16 2328  . cholecalciferol (VITAMIN D) tablet 2,000 Units  2,000 Units Oral Daily Rise Patience, MD   2,000 Units at 08/19/16 [phone]  . divalproex (DEPAKOTE SPRINKLE) capsule 500 mg  500 mg Oral BID Rise Patience, MD   500 mg at 08/19/16 4270  . hydrocortisone (ANUSOL-HC) suppository 25 mg  25 mg Rectal PRN Rise Patience, MD      . loratadine (CLARITIN) tablet 10 mg  10 mg Oral Daily Rise Patience, MD   10 mg at 08/19/16 6237  . multivitamin with minerals tablet 1 tablet  1 tablet Oral Daily Rise Patience, MD   1 tablet at 08/19/16 6283  . olopatadine (PATANOL) 0.1 % ophthalmic solution 1 drop  1 drop Both Eyes BID Rise Patience, MD   1 drop at 08/19/16 1517  . vancomycin (VANCOCIN) 500 mg in sodium chloride 0.9 % 100 mL IVPB  500 mg Intravenous Q12H Rise Patience, MD   Stopped at 08/19/16 1026     Discharge Medications: Please see discharge summary for a list of discharge medications.  Relevant Imaging Results:  Relevant Lab Results:   Additional Information SSN:  [id number]  Benard Halsted, LCSWA

## 2016-08-19 NOTE — Procedures (Signed)
ELECTROENCEPHALOGRAM REPORT  Date of Study: 08/19/2016  Patient's Name: [name] MRN: [id number] Date of Birth: 12-May-1924  Referring Provider: Dr. Gean Birchwood  Clinical History: This is an 83 year old woman with altered mental status.  Medications: divalproex (DEPAKOTE SPRINKLE) capsule 500 mg  acetaminophen (TYLENOL) tablet 650 mg  albuterol (PROVENTIL) (2.5 MG/3ML) 0.083% nebulizer solution 2.5 mg  apixaban (ELIQUIS) tablet 5 mg  atorvastatin (LIPITOR) tablet 40 mg  B-complex with vitamin C tablet 1 tablet  calcium-vitamin D (OSCAL WITH D) 500-200 MG-UNIT per tablet 1 tablet  ceFEPIme (MAXIPIME) 2 g in dextrose 5 % 50 mL IVPB  cholecalciferol (VITAMIN D) tablet 2,000 Units  hydrocortisone (ANUSOL-HC) suppository 25 mg  loratadine (CLARITIN) tablet 10 mg  multivitamin with minerals tablet 1 tablet  olopatadine (PATANOL) 0.1 % ophthalmic solution 1 drop  vancomycin (VANCOCIN) 500 mg in sodium chloride 0.9 % 100 mL IVPB   Technical Summary: A multichannel digital EEG recording measured by the international 10-20 system with electrodes applied with paste and impedances below 5000 ohms performed as portable with EKG monitoring in an awake and drowsy patient.  Hyperventilation and photic stimulation were not performed.  The digital EEG was referentially recorded, reformatted, and digitally filtered in a variety of bipolar and referential montages for optimal display.   Description: The patient is awake and drowsy during the recording.  During maximal wakefulness, there is a medium voltage 7 Hz posterior dominant rhythm that poorly attenuates with eye opening and eye closure. This is admixed with a moderate amount of diffuse 4-6 Hz theta and occasional diffuse 2-3 Hz delta slowing of the waking background.  During drowsiness, there is an increase in theta and delta slowing of the background. Deeper stages of sleep were not seen. Hyperventilation and photic stimulation were not  performed. There were no epileptiform discharges or electrographic seizures seen.    EKG lead showed occasional extrasystolic beats.  Impression: This awake and drowsy EEG is abnormal due to mild to moderate diffuse slowing of the waking background.  Clinical Correlation of the above findings indicates diffuse cerebral dysfunction that is non-specific in etiology and can be seen with hypoxic/ischemic injury, toxic/metabolic encephalopathies, neurodegenerative disorders, or medication effect.  The absence of epileptiform discharges does not rule out a clinical diagnosis of epilepsy.  Clinical correlation is advised.   Ellouise Newer, M.D.

## 2016-08-19 NOTE — Care Management Note (Signed)
Case Management Note  Patient Details  Name: CAMRYN LAMPSON MRN: [id number] Date of Birth: 1924/10/29  Subjective/Objective: Admitted with HCAP, hx of atrial fibrillation, complex partial seizures and dementia bed bound. Pt from SNF, [facility].  Zollie Scale (Daughter) Tillman Abide (Daughter) Chauncey Reading    [phone]      Action/Plan: Plan is to d/c back to SNF when medically stable. CSW is aware and managing disposition to SNF. CM to follow as needs presents.  Expected Discharge Date:                  Expected Discharge Plan:  Skilled Nursing Facility  In-House Referral:  Clinical Social Work  Discharge planning Services  CM Consult  Post Acute Care Choice:    Choice offered to:     DME Arranged:    DME Agency:     HH Arranged:    [place] Agency:     Status of Service:  In process, will continue to follow  If discussed at Long Length of Stay Meetings, dates discussed:    Additional Comments:  Sharin Mons, RN 08/19/2016, 2:27 PM

## 2016-08-19 NOTE — Clinical Social Work Note (Signed)
Clinical Social Work Assessment  Patient Details  Name: [name] MRN: [id number] Date of Birth: September 22, 1924  Date of referral:  08/19/16               Reason for consult:  Discharge Planning                Permission sought to share information with:  Facility Sport and exercise psychologist, Family Supports Permission granted to share information::  No  Name::     [name]::  [facility]  Relationship::  Daughter  Contact Information:  [phone]  Housing/Transportation Living arrangements for the past 2 months:  [place] of Information:  Adult Children Patient Interpreter Needed:  None Criminal Activity/Legal Involvement Pertinent to Current Situation/Hospitalization:  No - Comment as needed Significant Relationships:  Adult Children Lives with:  Facility Resident Do you feel safe going back to the place where you live?  Yes Need for family participation in patient care:  Yes (Comment)  Care giving concerns:  CSW received consult regarding discharge planning. CSW spoke with patient's daughter, [name]. Patient resides at [facility] and is almost bed bound. She will return to [facility] [facility] at discharge. CSW to continue to follow and assist with discharge planning needs.   Social Worker assessment / plan:  Patient's daughter expressed agreement with discharge back to [facility] by [place].   Employment status:  Retired Forensic scientist:  Medicare PT Recommendations:  Not assessed at this time Information / Referral to community resources:  [place]  Patient/Family's Response to care:  Patient's daughter is hopeful that her mom improves soon. [name] reported she has her own surgery next week so is hopeful to get patient back to [place] by then.   Patient/Family's Understanding of and Emotional Response to Diagnosis, Current Treatment, and Prognosis:  Patient/family is realistic regarding therapy needs and expressed being hopeful for SNF placement.  Patient expressed understanding of CSW role and discharge process as well as patient's medical condition. No questions/concerns about plan or treatment.    Emotional Assessment Appearance:  Appears stated age Attitude/Demeanor/Rapport:  Unable to Assess Affect (typically observed):  Unable to Assess Orientation:   (Disoriented) Alcohol / Substance use:  Not Applicable Psych involvement (Current and /or in the community):  No (Comment)  Discharge Needs  Concerns to be addressed:  Care Coordination Readmission within the last 30 days:  No Current discharge risk:  None Barriers to Discharge:  Continued Medical Work up   [employer], [place] 08/19/2016, 4:42 PM

## 2016-08-19 NOTE — Progress Notes (Signed)
MRSA PCR came back invalid, called infection disease to double check that contact precautions was not needed, it is not needed at this time.

## 2016-08-20 LAB — CBC WITH DIFFERENTIAL/PLATELET
BASOS ABS: 0 10*3/uL (ref 0.0–0.1)
Basophils Relative: 0 %
Eosinophils Absolute: 0.2 10*3/uL (ref 0.0–0.7)
Eosinophils Relative: 3 %
HEMATOCRIT: 36.6 % (ref 36.0–46.0)
Hemoglobin: 11.2 g/dL — ABNORMAL LOW (ref 12.0–15.0)
LYMPHS PCT: 30 %
Lymphs Abs: 1.7 10*3/uL (ref 0.7–4.0)
MCH: 26.3 pg (ref 26.0–34.0)
MCHC: 30.6 g/dL (ref 30.0–36.0)
MCV: 85.9 fL (ref 78.0–100.0)
MONO ABS: 1.3 10*3/uL — AB (ref 0.1–1.0)
Monocytes Relative: 23 %
NEUTROS ABS: 2.5 10*3/uL (ref 1.7–7.7)
Neutrophils Relative %: 44 %
Platelets: 252 10*3/uL (ref 150–400)
RBC: 4.26 MIL/uL (ref 3.87–5.11)
RDW: 15.8 % — ABNORMAL HIGH (ref 11.5–15.5)
WBC: 5.7 10*3/uL (ref 4.0–10.5)

## 2016-08-20 LAB — COMPREHENSIVE METABOLIC PANEL
ALT: 10 U/L — AB (ref 14–54)
AST: 20 U/L (ref 15–41)
Albumin: 2.1 g/dL — ABNORMAL LOW (ref 3.5–5.0)
Alkaline Phosphatase: 74 U/L (ref 38–126)
Anion gap: 7 (ref 5–15)
BILIRUBIN TOTAL: 0.4 mg/dL (ref 0.3–1.2)
BUN: 8 mg/dL (ref 6–20)
CO2: 30 mmol/L (ref 22–32)
CREATININE: 0.54 mg/dL (ref 0.44–1.00)
Calcium: 8.6 mg/dL — ABNORMAL LOW (ref 8.9–10.3)
Chloride: 105 mmol/L (ref 101–111)
GFR calc Af Amer: >60 mL/min (ref >60)
Glucose, Bld: 102 mg/dL — ABNORMAL HIGH (ref 65–99)
Potassium: 4.2 mmol/L (ref 3.5–5.1)
Sodium: 142 mmol/L (ref 135–145)
TOTAL PROTEIN: 5.3 g/dL — AB (ref 6.5–8.1)

## 2016-08-20 LAB — MRSA CULTURE: Culture: NOT DETECTED

## 2016-08-20 LAB — MAGNESIUM: MAGNESIUM: 2 mg/dL (ref 1.7–2.4)

## 2016-08-20 MED ORDER — LEVOFLOXACIN IN D5W 750 MG/150ML IV SOLN
750.0000 mg | INTRAVENOUS | Status: DC
  Filled 2016-08-20: qty 150

## 2016-08-20 MED ORDER — LEVOFLOXACIN 750 MG PO TABS
750.0000 mg | ORAL_TABLET | ORAL | Status: DC
  Administered 2016-08-20: 750 mg via ORAL
  Filled 2016-08-20 (×2): qty 1

## 2016-08-20 MED ORDER — GUAIFENESIN ER 600 MG PO TB12
600.0000 mg | ORAL_TABLET | Freq: Two times a day (BID) | ORAL | Status: DC
  Administered 2016-08-20 – 2016-08-22 (×5): 600 mg via ORAL
  Filled 2016-08-20 (×5): qty 1

## 2016-08-20 NOTE — Consult Note (Signed)
   [facility] CM Primary Care Navigator  08/20/2016  [name] 12/24/1924 [id number]   Met with patientand daughter [name]) at the bedsideto identify possible discharge needs. Daughter reports that patient was having increased mental status change/ lethargy that had led to this admission. Daughter mentioned  that patient has been residing at [facility] for  almost a year and with care needs being provided by facility staff which includes administering of medications and transportation to other doctors' appointments.  Patient's daughter states that Dr. Blanchie Serve with [facility] is theprimary care provider for patient in [place] who comes to the facility to see/ check patient.  Plan for discharge is to return back to skilled nursing facility [facility]) when medically stable per daughter.  Daughter denies any further needs or concerns at this time.   For additional questions please contact:  Edwena Felty A. Averill Winters, BSN, RN-BC [facility] PRIMARY CARE Navigator Cell: [phone]

## 2016-08-20 NOTE — Progress Notes (Signed)
[facility] Progress Note  Patient: [name]:[id number]   PCP: Gildardo Cranker, DO DOB: Apr 02, 1924   DOA: 08/18/2016   DOS: 08/20/2016   Date of Service: the patient was seen and examined on 08/20/2016  Subjective: Feeling better, confusion is getting better. No nausea no vomiting no chest pain. No abdominal pain. Able to follow command and answers questions appropriately. Significantly hard of hearing.  Brief hospital course: Pt. with PMH of A. fib, seizures, dementia; admitted on 08/18/2016, presented with complaint of acute encephalopathy, was found to have healthcare associated pneumonia. Currently further plan is continue antibiotics.  Assessment and Plan: 1. Acute encephalopathy. Healthcare associated pneumonia. Patient was started on vancomycin and cefepime. No blood cultures performed. At present I do not suspect that the patient has an active sepsis. The transition the patient to oral Levaquin and finish 5 day treatment course. Patient tolerating oral diet lack able to discharge back to SNF tomorrow.  2. Chronic A. fib. Continue Apixaban. Stroke risk or more than 2.  3. History of convex partial seizure. No active seizures at present. Depakote level normal ammonia level normal. Continue current regimen. Dose recently increased in last visit.  4. Dementia. Currently no behavioral symptoms. Continue to monitor. This actually appears to be her new baseline.  Diet: Cardiac diet DVT Prophylaxis: subcutaneous Heparin  Advance goals of care discussion: DNR/DNI  Family Communication: family was present at bedside, at the time of interview. The pt provided permission to discuss medical plan with the family. Opportunity was given to ask question and all questions were answered satisfactorily.   Disposition:  Discharge to SNF tomorrow.  Consultants: None Procedures: None  Antibiotics: Anti-infectives    Start     Dose/Rate Route Frequency Ordered Stop   08/20/16 1100  levofloxacin (LEVAQUIN) IVPB 750 mg  Status:  Discontinued     750 mg 100 mL/hr over 90 Minutes Intravenous Every 48 hours 08/20/16 1041 08/20/16 1049   08/20/16 1100  levofloxacin (LEVAQUIN) tablet 750 mg     750 mg Oral Every 48 hours 08/20/16 1049     08/18/16 2200  ceFEPIme (MAXIPIME) 2 g in dextrose 5 % 50 mL IVPB  Status:  Discontinued     2 g 100 mL/hr over 30 Minutes Intravenous Every 24 hours 08/18/16 2130 08/20/16 0750   08/18/16 2200  vancomycin (VANCOCIN) 500 mg in sodium chloride 0.9 % 100 mL IVPB  Status:  Discontinued     500 mg 100 mL/hr over 60 Minutes Intravenous Every 12 hours 08/18/16 2136 08/20/16 0749   08/18/16 1800  cefTRIAXone (ROCEPHIN) 1 g in dextrose 5 % 50 mL IVPB     1 g 100 mL/hr over 30 Minutes Intravenous  Once 08/18/16 1759 08/18/16 1940   08/18/16 1800  azithromycin (ZITHROMAX) 500 mg in dextrose 5 % 250 mL IVPB     500 mg 250 mL/hr over 60 Minutes Intravenous  Once 08/18/16 1759 08/18/16 2058       Objective: Physical Exam: Vitals:   08/19/16 1354 08/19/16 2229 08/20/16 0503 08/20/16 1427  BP: (!) 154/73 (!) 151/63 (!) 154/74 (!) 157/71  Pulse: 83 84 68 97  Resp: 17 18 18    Temp: 97.7 F (36.5 C) 97.3 F (36.3 C) 98.7 F (37.1 C) 98 F (36.7 C)  TempSrc: Oral Oral  Oral  SpO2: 100% 100% 98% 100%  Weight:      Height:        Intake/Output Summary (Last 24 hours) at 08/20/16 1735 Last  data filed at 08/20/16 0500  Gross per 24 hour  Intake          1296.25 ml  Output              150 ml  Net          1146.25 ml   Filed Weights   08/18/16 2111 08/19/16 0201  Weight: 68.1 kg (150 lb 1.6 oz) 68.1 kg (150 lb 2.1 oz)   General: Alert, Awake and Oriented to Time, Place and Person. Appear in mild distress, affect appropriate Eyes: PERRL, Conjunctiva normal ENT: Oral Mucosa clear moist. Neck: difficult to assess JVD, no Abnormal Mass Or lumps Cardiovascular: S1 and S2 Present, aortic systolic Murmur, Respiratory: Bilateral  Air entry equal and Decreased, no use of accessory muscle, Clear to Auscultation, no Crackles, no wheezes Abdomen: Bowel Sound present, Soft and no tenderness Skin: no redness, no Rash, no induration Extremities: no Pedal edema, no calf tenderness Neurologic: Grossly no focal neuro deficit. Bilaterally Equal motor strength  Data Reviewed: CBC:  Recent Labs Lab 08/18/16 1641 08/19/16 0440 08/20/16 0506  WBC 5.7 5.6 5.7  NEUTROABS 3.6 2.7 2.5  HGB 11.0* 10.9* 11.2*  HCT 35.2* 35.5* 36.6  MCV 84.6 85.5 85.9  PLT 252 229 161   Basic Metabolic Panel:  Recent Labs Lab 08/18/16 1641 08/19/16 0440 08/20/16 0506  NA 138 142 142  K 4.8 3.7 4.2  CL 104 106 105  CO2 27 31 30   GLUCOSE 172* 93 102*  BUN 18 14 8   CREATININE 0.66 0.62 0.54  CALCIUM 8.5* 8.4* 8.6*  MG  --   --  2.0    Liver Function Tests:  Recent Labs Lab 08/18/16 1641 08/19/16 0440 08/20/16 0506  AST 30 21 20   ALT 11* 11* 10*  ALKPHOS 76 67 74  BILITOT 0.8 0.3 0.4  PROT 5.5* 5.3* 5.3*  ALBUMIN 2.1* 2.2* 2.1*   No results for input(s): LIPASE, AMYLASE in the last 168 hours.  Recent Labs Lab 08/18/16 2202  AMMONIA 26   Coagulation Profile: No results for input(s): INR, PROTIME in the last 168 hours. Cardiac Enzymes: No results for input(s): CKTOTAL, CKMB, CKMBINDEX, TROPONINI in the last 168 hours. BNP (last 3 results) No results for input(s): PROBNP in the last 8760 hours. CBG: No results for input(s): GLUCAP in the last 168 hours. Studies: No results found.  Scheduled Meds: . apixaban  5 mg Oral BID  . atorvastatin  40 mg Oral q1800  . B-complex with vitamin C  1 tablet Oral BID  . calcium-vitamin D  1 tablet Oral BID  . cholecalciferol  2,000 Units Oral Daily  . divalproex  500 mg Oral BID  . guaiFENesin  600 mg Oral BID  . levofloxacin  750 mg Oral Q48H  . loratadine  10 mg Oral Daily  . multivitamin with minerals  1 tablet Oral Daily  . olopatadine  1 drop Both Eyes BID    Continuous Infusions: PRN Meds: acetaminophen **OR** acetaminophen, albuterol, hydrocortisone  Time spent: 35 minutes  Author: Berle Mull, MD [facility] Pager: [phone] 08/20/2016 5:35 PM  If 7PM-7AM, please contact night-coverage at [email or web address], password [facility]

## 2016-08-20 NOTE — Discharge Instructions (Signed)

## 2016-08-20 NOTE — Progress Notes (Addendum)
Pharmacy Antibiotic Note  [name] is a 83 y.o. female with dementia and bedbound who presented on 5/23 with increasing lethargy. Pharmacy has been consulted for Levaquin dosing for empiric HCAP.  Plan: 1. Start Levaquin 750 mg po every 48 hours 2. Will continue to follow renal function, culture results, LOT, and antibiotic de-escalation plans   Height: 5\' 4"  (162.6 cm) Weight: 150 lb 2.1 oz (68.1 kg) IBW/kg (Calculated) : 54.7  Temp (24hrs), Avg:97.9 F (36.6 C), Min:97.3 F (36.3 C), Max:98.7 F (37.1 C)   Recent Labs Lab 08/18/16 1641 08/19/16 0440 08/20/16 0506  WBC 5.7 5.6 5.7  CREATININE 0.66 0.62 0.54    Estimated Creatinine Clearance: 45.2 mL/min (by C-G formula based on SCr of 0.54 mg/dL).    Allergies  Allergen Reactions  . Aricept [Donepezil Hcl] Hypertension    With N/V  . Tramadol Hcl Other (See Comments)     lethargy, nausea  . Alendronate Sodium Rash and Other (See Comments)    puffy eyes    Antimicrobials this admission: CTX + Azithro 5/23 x 1 Cefepime 5/23 >> 5/24 Vanc 5/24 >> 5/24 LVQ 5/25 >>  Dose adjustments this admission: n/a  Microbiology results: 5/24 RCx >> 5/24 MRSA PCR >> invalid  Thank you for allowing pharmacy to be a part of this patient's care.  Alycia Rossetti, PharmD, BCPS Clinical Pharmacist Pager: [phone] Clinical phone for 08/20/2016 from 7a-3:30p: [phone] If after 3:30p, please call main pharmacy at: [phone] 08/20/2016 10:45 AM

## 2016-08-21 DIAGNOSIS — G3183 Dementia with Lewy bodies: Secondary | ICD-10-CM

## 2016-08-21 DIAGNOSIS — R4701 Aphasia: Secondary | ICD-10-CM

## 2016-08-21 DIAGNOSIS — Z515 Encounter for palliative care: Secondary | ICD-10-CM

## 2016-08-21 DIAGNOSIS — Z87898 Personal history of other specified conditions: Secondary | ICD-10-CM

## 2016-08-21 DIAGNOSIS — F0280 Dementia in other diseases classified elsewhere without behavioral disturbance: Secondary | ICD-10-CM

## 2016-08-21 DIAGNOSIS — Z8673 Personal history of transient ischemic attack (TIA), and cerebral infarction without residual deficits: Secondary | ICD-10-CM

## 2016-08-21 LAB — BASIC METABOLIC PANEL
Anion gap: 7 (ref 5–15)
BUN: 14 mg/dL (ref 6–20)
CALCIUM: 8.5 mg/dL — AB (ref 8.9–10.3)
CO2: 27 mmol/L (ref 22–32)
Chloride: 104 mmol/L (ref 101–111)
Creatinine, Ser: 0.78 mg/dL (ref 0.44–1.00)
GFR calc Af Amer: >60 mL/min (ref >60)
GLUCOSE: 217 mg/dL — AB (ref 65–99)
POTASSIUM: 4.2 mmol/L (ref 3.5–5.1)
Sodium: 138 mmol/L (ref 135–145)

## 2016-08-21 LAB — CBC WITH DIFFERENTIAL/PLATELET
Basophils Absolute: 0 10*3/uL (ref 0.0–0.1)
Basophils Relative: 0 %
EOS PCT: 0 %
Eosinophils Absolute: 0 10*3/uL (ref 0.0–0.7)
HCT: 36.9 % (ref 36.0–46.0)
Hemoglobin: 11.6 g/dL — ABNORMAL LOW (ref 12.0–15.0)
LYMPHS ABS: 1.3 10*3/uL (ref 0.7–4.0)
LYMPHS PCT: 17 %
MCH: 27 pg (ref 26.0–34.0)
MCHC: 31.4 g/dL (ref 30.0–36.0)
MCV: 85.8 fL (ref 78.0–100.0)
MONO ABS: 1.4 10*3/uL — AB (ref 0.1–1.0)
Monocytes Relative: 18 %
Neutro Abs: 5 10*3/uL (ref 1.7–7.7)
Neutrophils Relative %: 65 %
PLATELETS: 255 10*3/uL (ref 150–400)
RBC: 4.3 MIL/uL (ref 3.87–5.11)
RDW: 16 % — AB (ref 11.5–15.5)
WBC: 7.7 10*3/uL (ref 4.0–10.5)

## 2016-08-21 MED ORDER — AMOXICILLIN-POT CLAVULANATE 500-125 MG PO TABS
1.0000 | ORAL_TABLET | Freq: Three times a day (TID) | ORAL | Status: DC
  Filled 2016-08-21: qty 1

## 2016-08-21 MED ORDER — AMOXICILLIN-POT CLAVULANATE 500-125 MG PO TABS
1.0000 | ORAL_TABLET | Freq: Three times a day (TID) | ORAL | Status: DC
  Administered 2016-08-22: 500 mg via ORAL
  Filled 2016-08-21 (×2): qty 1

## 2016-08-21 NOTE — Progress Notes (Signed)
[facility] Progress Note  Patient: [name] ZHY:[id number]   PCP: Gildardo Cranker, DO DOB: [birth year]   DOA: 08/18/2016   DOS: 08/21/2016   Date of Service: the patient was seen and examined on 08/21/2016  Subjective: Patient remains hard of hearing although shows significant weakness and lethargy as compared to yesterday. Patient is also unable to answered questions appropriately.  Brief hospital course: Pt. with PMH of A. fib, seizures, dementia; admitted on 08/18/2016, presented with complaint of acute encephalopathy, was found to have healthcare associated pneumonia. Currently further plan is continue antibiotics.  Assessment and Plan: 1. Acute encephalopathy. Healthcare associated pneumonia. Patient was started on vancomycin and cefepime. No blood cultures performed. At present I do not suspect that the patient has an active sepsis. The transition the patient to oral Levaquin. Her encephalopathy appears to have worsened today. I appear that this is actually progression of her dementia rather than an acute organic disease that is reversible. I discussed this with patient's daughter and recommend palliative care consult. I would also change her antibiotics from Levaquin to Augmentin since that could have some CNS toxicity.  2. Chronic A. fib. Continue Apixaban. Stroke risk or more than 2.  3. History of complex partial seizure. No active seizures at present. Depakote level normal ammonia level normal. Continue current regimen. Dose recently increased in last visit.  4. Dementia. Currently no behavioral symptoms. Continue to monitor. This actually appears to be her new baseline. I recommended palliative care consult as well as approach of SNF with hospice.  Diet: Cardiac diet DVT Prophylaxis: subcutaneous Heparin  Advance goals of care discussion: DNR/DNI  Family Communication: family was present at bedside, at the time of interview. The pt provided permission to  discuss medical plan with the family. Opportunity was given to ask question and all questions were answered satisfactorily.   Disposition:  Discharge to SNF tomorrow.  Consultants: Palliative care Procedures: None  Antibiotics: Anti-infectives    Start     Dose/Rate Route Frequency Ordered Stop   08/22/16 1000  amoxicillin-clavulanate (AUGMENTIN) 500-125 MG per tablet 500 mg     1 tablet Oral 3 times daily 08/21/16 1256     08/21/16 1600  amoxicillin-clavulanate (AUGMENTIN) 500-125 MG per tablet 500 mg  Status:  Discontinued     1 tablet Oral 3 times daily 08/21/16 1230 08/21/16 1256   08/20/16 1100  levofloxacin (LEVAQUIN) IVPB 750 mg  Status:  Discontinued     750 mg 100 mL/hr over 90 Minutes Intravenous Every 48 hours 08/20/16 1041 08/20/16 1049   08/20/16 1100  levofloxacin (LEVAQUIN) tablet 750 mg  Status:  Discontinued     750 mg Oral Every 48 hours 08/20/16 1049 08/21/16 1229   08/18/16 2200  ceFEPIme (MAXIPIME) 2 g in dextrose 5 % 50 mL IVPB  Status:  Discontinued     2 g 100 mL/hr over 30 Minutes Intravenous Every 24 hours 08/18/16 2130 08/20/16 0750   08/18/16 2200  vancomycin (VANCOCIN) 500 mg in sodium chloride 0.9 % 100 mL IVPB  Status:  Discontinued     500 mg 100 mL/hr over 60 Minutes Intravenous Every 12 hours 08/18/16 2136 08/20/16 0749   08/18/16 1800  cefTRIAXone (ROCEPHIN) 1 g in dextrose 5 % 50 mL IVPB     1 g 100 mL/hr over 30 Minutes Intravenous  Once 08/18/16 1759 08/18/16 1940   08/18/16 1800  azithromycin (ZITHROMAX) 500 mg in dextrose 5 % 250 mL IVPB     500 mg  250 mL/hr over 60 Minutes Intravenous  Once 08/18/16 1759 08/18/16 2058       Objective: Physical Exam: Vitals:   08/20/16 2155 08/21/16 0500 08/21/16 0610 08/21/16 1622  BP: (!) 148/78  (!) 135/99 139/66  Pulse: (!) 105  68 86  Resp: (!) 22  20 20   Temp:   97.8 F (36.6 C) 98.4 F (36.9 C)  TempSrc:   Oral   SpO2: 96%  96% 100%  Weight:  70.5 kg (155 lb 8 oz)  95 kg (209 lb 6.4 oz)    Height:    5\' 6"  (1.676 m)    Intake/Output Summary (Last 24 hours) at 08/21/16 1726 Last data filed at 08/21/16 1300  Gross per 24 hour  Intake              480 ml  Output              500 ml  Net              -20 ml   Filed Weights   08/19/16 0201 08/21/16 0500 08/21/16 1622  Weight: 68.1 kg (150 lb 2.1 oz) 70.5 kg (155 lb 8 oz) 95 kg (209 lb 6.4 oz)   General: Alert, Awake and Unable to a orientation question, able to follow command. Appear in mild distress, affect appropriate Eyes: PERRL, Conjunctiva normal ENT: Oral Mucosa clear moist. Neck: difficult to assess JVD, no Abnormal Mass Or lumps Cardiovascular: S1 and S2 Present, aortic systolic Murmur, Peripheral Pulses Present Respiratory: Bilateral Air entry equal and Decreased, no use of accessory muscle, Clear to Auscultation, no Crackles, no wheezes Abdomen: Bowel Sound present, Soft and no tenderness Skin: redness no, no Rash, no induration Extremities: no Pedal edema, no calf tenderness Neurologic: Grossly no focal neuro deficit. Bilaterally Equal motor strength  Data Reviewed: CBC:  Recent Labs Lab 08/18/16 1641 08/19/16 0440 08/20/16 0506 08/21/16 1333  WBC 5.7 5.6 5.7 7.7  NEUTROABS 3.6 2.7 2.5 5.0  HGB 11.0* 10.9* 11.2* 11.6*  HCT 35.2* 35.5* 36.6 36.9  MCV 84.6 85.5 85.9 85.8  PLT 252 229 252 638   Basic Metabolic Panel:  Recent Labs Lab 08/18/16 1641 08/19/16 0440 08/20/16 0506 08/21/16 1333  NA 138 142 142 138  K 4.8 3.7 4.2 4.2  CL 104 106 105 104  CO2 27 31 30 27   GLUCOSE 172* 93 102* 217*  BUN 18 14 8 14   CREATININE 0.66 0.62 0.54 0.78  CALCIUM 8.5* 8.4* 8.6* 8.5*  MG  --   --  2.0  --     Liver Function Tests:  Recent Labs Lab 08/18/16 1641 08/19/16 0440 08/20/16 0506  AST 30 21 20   ALT 11* 11* 10*  ALKPHOS 76 67 74  BILITOT 0.8 0.3 0.4  PROT 5.5* 5.3* 5.3*  ALBUMIN 2.1* 2.2* 2.1*   No results for input(s): LIPASE, AMYLASE in the last 168 hours.  Recent Labs Lab  08/18/16 2202  AMMONIA 26   Coagulation Profile: No results for input(s): INR, PROTIME in the last 168 hours. Cardiac Enzymes: No results for input(s): CKTOTAL, CKMB, CKMBINDEX, TROPONINI in the last 168 hours. BNP (last 3 results) No results for input(s): PROBNP in the last 8760 hours. CBG: No results for input(s): GLUCAP in the last 168 hours. Studies: No results found.  Scheduled Meds: . [START ON 08/22/2016] amoxicillin-clavulanate  1 tablet Oral TID  . apixaban  5 mg Oral BID  . atorvastatin  40 mg Oral q1800  .  B-complex with vitamin C  1 tablet Oral BID  . calcium-vitamin D  1 tablet Oral BID  . cholecalciferol  2,000 Units Oral Daily  . divalproex  500 mg Oral BID  . guaiFENesin  600 mg Oral BID  . loratadine  10 mg Oral Daily  . multivitamin with minerals  1 tablet Oral Daily  . olopatadine  1 drop Both Eyes BID   Continuous Infusions: PRN Meds: acetaminophen **OR** acetaminophen, albuterol, hydrocortisone  Time spent: 35 minutes  Author: Berle Mull, MD [facility] Pager: [phone] 08/21/2016 5:26 PM  If 7PM-7AM, please contact night-coverage at [email or web address], password [facility]

## 2016-08-21 NOTE — Consult Note (Signed)
Consultation Note Date: 08/21/2016   Patient Name: [name]  DOB: 08/08/1924  MRN: [id number]  Age / Sex: 83 y.o., female  PCP: Gildardo Cranker, DO Referring Physician: Lavina Hamman, MD  Reason for Consultation: Establishing goals of care, Hospice Evaluation and Psychosocial/spiritual support  HPI/Patient Profile: 83 y.o. female  with past medical history of CVA 3, atrial fib, seizures, severe dementia, recurrent UTI, debility admitted on 08/18/2016 with altered mental status and aphasia.. Chest x-ray was showing infiltrates concerning for pneumonia and patient was admitted for further management of healthcare associated pneumonia.  Clinical Assessment and Goals of Care: Met with patient and her 3 daughters. Patient is minimally conversant when awake which is apparently a change for her. Daughters verbalized that they were seen her mother get less verbal more lethargic prior to coming to the hospital and was concerned that she was having another stroke. Patient is bedbound at baseline. She had live with her daughters up until August 2017 when she moved to a skilled nursing facility. She has had a good appetite with no previous sign of dysphasia but daughters reports seen her beginning to pocket and hold food.  We did discuss at length the disease trajectory related to dementia. Also reviewed with family benefits of hospice engagement in the facility  Patient's 3 daughters act as her healthcare proxy. They are listed under  demographics tab in [place] with DNR/DNI Discussed with family that this may be her new baseline secondary to dementia. Discussed the benefits of hospice to support patient as well as family in the facility as this disease goes forward and to help minimize hospitalizations and maintain comfort Family agreed to elect hospice services. Consult placed to  social work department to help facilitate this to discharge  Code Status/Advance Care Planning:  DNR   Palliative Prophylaxis:   Aspiration, Bowel Regimen, Delirium Protocol, Frequent Pain Assessment, Oral Care and Turn Reposition  Additional Recommendations (Limitations, Scope, Preferences):  Avoid Hospitalization, Minimize Medications, Initiate Comfort Feeding, No Artificial Feeding, No Chemotherapy, No Hemodialysis, No Radiation, No Surgical Procedures and No Tracheostomy  Psycho-social/Spiritual:   Desire for further Chaplaincy support:no  Additional Recommendations: Grief/Bereavement Support  Prognosis:   < 6 months in the setting of advanced dementia, history of CVA, recurrent urinary tract infection, seizure disorder, debility, protein calorie malnutrition with an albumin of 2.1  Discharge Planning: [place] with Hospice      Primary Diagnoses: Present on Admission: . HCAP (healthcare-associated pneumonia) . Paroxysmal atrial fibrillation (HCC)   I have reviewed the medical record, interviewed the patient and family, and examined the patient. The following aspects are pertinent.  Past Medical History:  Diagnosis Date  . Acute cystitis without hematuria   . Acute encephalopathy   . Arthritis   . Atrial fibrillation ([place])    on Eliquis Rx  . Atrial flutter ([place]) January, 2012  . Chronic anticoagulation   . Colon polyp   . Constipation   . Hearing difficulty   .  Hemorrhoids   . High cholesterol   . HLD (hyperlipidemia)   . Hypertension   . Left leg weakness 02/05/2013  . Left-sided weakness   . Malnutrition ([place])   . Migraine   . Mild cognitive impairment with memory loss   . Osteoporosis   . Seizures ([place]) 06/26/2016  . Stroke ([place]) 08/02/12    right lenticular nucleus infarct, 2 strokes  . TIA (transient ischemic attack)   . Torus palatinus    Social History   Social History  . Marital status: Widowed    Spouse name: N/A  .  Number of children: 3  . Years of education: college   Occupational History  . 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 Smoking status: Former Research scientist (life sciences)  . Smokeless tobacco: Never Used  . Alcohol use No     Comment: 08/01/2012 "glass of wine on special occasions"  . Drug use: No  . Sexual activity: No   Other Topics Concern  . None   Social History Narrative   Patient lives at home with daughters.   Caffeine use: 1/2-1 cup of coffee occasionally         Diet: Regular      Do you drink/ eat things with caffeine? Yes      Marital status:   Widowed                            What year were you married ?       Do you live in a house, apartment,assistred living, condo, trailer, etc.)? House      Is it one or more stories? Yes      How many persons live in your home ? 4      Do you have any pets in your home ?(please list) Yes/ 1 Dog, 2 Cats      Current or past profession: Editor      Do you exercise? No                             Type & how often:      Do you have a living will? Yes      Do you have a DNR form?  Yes                     If not, do you want to discuss one?       Do you have signed POA?HPOA forms? Yes                If so, please bring to your        appointment      regular   Family History  Problem Relation Age of Onset  . Cancer Mother        breast  . Aneurysm Mother        brain  . Stroke Mother   . Colon cancer Father   . Breast cancer Unknown   . Brain cancer Unknown   . Heart attack Neg Hx   . Diabetes Neg Hx   . Hypertension Neg Hx    Scheduled Meds: . [START ON 08/22/2016] amoxicillin-clavulanate  1 tablet Oral TID  . apixaban  5 mg Oral BID  . atorvastatin  40 mg Oral q1800  . B-complex with vitamin C  1 tablet Oral BID  .  calcium-vitamin D  1 tablet Oral BID  . cholecalciferol  2,000 Units Oral Daily  . divalproex  500 mg Oral BID  . guaiFENesin  600 mg Oral BID  . loratadine  10 mg Oral Daily    . multivitamin with minerals  1 tablet Oral Daily  . olopatadine  1 drop Both Eyes BID   Continuous Infusions: PRN Meds:.acetaminophen **OR** acetaminophen, albuterol, hydrocortisone Medications Prior to Admission:  Prior to Admission medications   Medication Sig Start Date End Date Taking? Authorizing Provider  acetaminophen (TYLENOL) 500 MG tablet Take 500 mg by mouth every 8 (eight) hours as needed.   Yes [provider]  albuterol (PROVENTIL) (2.5 MG/3ML) 0.083% nebulizer solution Take 2.5 mg by nebulization 3 (three) times daily as needed for wheezing or shortness of breath.   Yes [provider]  apixaban (ELIQUIS) 5 MG TABS tablet Take 1 tablet (5 mg total) by mouth 2 (two) times daily. 11/26/15  Yes Ahmed, Elyn Peers, MD  atorvastatin (LIPITOR) 40 MG tablet Take 1 tablet (40 mg total) by mouth daily at 6 PM. 11/26/15  Yes Ahmed, Elyn Peers, MD  b complex vitamins tablet Take 1 tablet by mouth 2 (two) times daily.    Yes [provider]  Calcium Carb-Cholecalciferol (CALCIUM 600 + D) 600-200 MG-UNIT TABS Take 1 tablet by mouth 2 (two) times daily.   Yes [provider]  cetirizine (ZYRTEC) 10 MG tablet Take 10 mg by mouth at bedtime.    Yes [provider]  Cholecalciferol (VITAMIN D3) 2000 units TABS Take 2,000 Units by mouth daily.    Yes [provider]  Cranberry 250 MG CAPS Take 2 capsules by mouth 2 (two) times daily. Take 2 capsules to = 500 mg po qd   Yes [provider]  divalproex (DEPAKOTE SPRINKLE) 125 MG capsule Take 500 mg by mouth 2 (two) times daily.   Yes [provider]  hydrocortisone (ANUSOL-HC) 2.5 % rectal cream Place 1 application rectally as needed for hemorrhoids or itching.   Yes [provider]  hydrocortisone (ANUSOL-HC) 25 MG suppository Place 25 mg rectally as needed for hemorrhoids or itching.   Yes [provider]  Melatonin 3 MG TABS Take 3 mg by mouth at bedtime.    Yes  [provider]  Multiple Vitamins-Minerals (MULTIVITAMIN WITH MINERALS) tablet Take 1 tablet by mouth daily.   Yes [provider]  olopatadine (PATANOL) 0.1 % ophthalmic solution PLACE 1 DROP INTO BOTH EYES 2 (TWO) TIMES DAILY. 10/27/15  Yes Montez Morita, Monica, DO  ondansetron (ZOFRAN) 4 MG tablet Take 1 tablet (4 mg total) by mouth every 6 (six) hours as needed for nausea. 10/31/14  Yes Christiane Ha, MD   Allergies  Allergen Reactions  . Aricept [Donepezil Hcl] Hypertension    With N/V  . Tramadol Hcl Other (See Comments)     lethargy, nausea  . Alendronate Sodium Rash and Other (See Comments)    puffy eyes   Review of Systems  Unable to perform ROS: Patient nonverbal    Physical Exam  Constitutional: She appears well-developed.  Elderly female, no acute distress, lethargic  HENT:  Head: Normocephalic and atraumatic.  Pulmonary/Chest: Effort normal.  Neurological:  Fluctuating level of consciousness. Unable to check orientation. Patient is minimally verbal. Does occasionally answer questions appropriately  Skin: Skin is warm and dry. There is pallor.  Psychiatric:  Unable to test  Nursing note and vitals reviewed.   Vital Signs: BP 139/66 (BP Location:  Left Arm)   Pulse 86   Temp 98.4 F (36.9 C)   Resp 20   Ht '5\' 6"'$  (1.676 m)   Wt 95 kg (209 lb 6.4 oz)   SpO2 100%   BMI 33.80 kg/m  Pain Assessment: No/denies pain   Pain Score: 0-No pain   SpO2: SpO2: 100 % O2 Device:SpO2: 100 % O2 Flow Rate: .   IO: Intake/output summary:  Intake/Output Summary (Last 24 hours) at 08/21/16 1745 Last data filed at 08/21/16 1300  Gross per 24 hour  Intake              480 ml  Output              500 ml  Net              -20 ml    LBM: Last BM Date: 08/19/16 Baseline Weight: Weight: 68.1 kg (150 lb 1.6 oz) Most recent weight: Weight: 95 kg (209 lb 6.4 oz)     Palliative Assessment/Data:   Flowsheet Rows     Most Recent Value  Intake Tab  Referral  Department  Hospitalist  Unit at Time of Referral  Med/Surg Unit  Palliative Care Primary Diagnosis  Other (Comment)  Date Notified  08/21/16  Palliative Care Type  New Palliative care  Reason for referral  Clarify Goals of Care  Date of Admission  08/18/16  Date first seen by Palliative Care  08/21/16  # of days Palliative referral response time  0 Day(s)  # of days IP prior to Palliative referral  3  Clinical Assessment  Palliative Performance Scale Score  30%  Pain Max last 24 hours  Not able to report  Pain Min Last 24 hours  Not able to report  Dyspnea Max Last 24 Hours  Not able to report  Dyspnea Min Last 24 hours  Not able to report  Nausea Max Last 24 Hours  Not able to report  Nausea Min Last 24 Hours  Not able to report  Anxiety Max Last 24 Hours  Not able to report  Anxiety Min Last 24 Hours  Not able to report  Other Max Last 24 Hours  Not able to report  Psychosocial & Spiritual Assessment  Palliative Care Outcomes  Patient/Family meeting held?  Yes  Who was at the meeting?  3 daughters and niece  Palliative Care Outcomes  Transitioned to hospice  Patient/Family wishes: Interventions discontinued/not started   Mechanical Ventilation, Hemodialysis, Vasopressors, PEG, Tube feedings/TPN, Trach  Palliative Care follow-up planned  Yes, Facility      Time In: 1630 Time Out: 1740 Time Total: 70 min Greater than 50%  of this time was spent counseling and coordinating care related to the above assessment and plan. Staffed with Dr. Posey Pronto  Signed by: Dory Horn, NP   Please contact Palliative Medicine Team phone at [phone] for questions and concerns.  For individual provider: See Shea Evans

## 2016-08-22 DIAGNOSIS — R4701 Aphasia: Secondary | ICD-10-CM | POA: Diagnosis not present

## 2016-08-22 DIAGNOSIS — Z515 Encounter for palliative care: Secondary | ICD-10-CM | POA: Diagnosis not present

## 2016-08-22 DIAGNOSIS — E785 Hyperlipidemia, unspecified: Secondary | ICD-10-CM | POA: Diagnosis not present

## 2016-08-22 DIAGNOSIS — R278 Other lack of coordination: Secondary | ICD-10-CM | POA: Diagnosis not present

## 2016-08-22 DIAGNOSIS — Z79899 Other long term (current) drug therapy: Secondary | ICD-10-CM | POA: Diagnosis not present

## 2016-08-22 DIAGNOSIS — I10 Essential (primary) hypertension: Secondary | ICD-10-CM | POA: Diagnosis not present

## 2016-08-22 DIAGNOSIS — R531 Weakness: Secondary | ICD-10-CM | POA: Diagnosis not present

## 2016-08-22 DIAGNOSIS — M6281 Muscle weakness (generalized): Secondary | ICD-10-CM | POA: Diagnosis not present

## 2016-08-22 DIAGNOSIS — R4182 Altered mental status, unspecified: Secondary | ICD-10-CM | POA: Diagnosis not present

## 2016-08-22 DIAGNOSIS — I6319 Cerebral infarction due to embolism of other precerebral artery: Secondary | ICD-10-CM | POA: Diagnosis not present

## 2016-08-22 DIAGNOSIS — M150 Primary generalized (osteo)arthritis: Secondary | ICD-10-CM | POA: Diagnosis not present

## 2016-08-22 DIAGNOSIS — G3183 Dementia with Lewy bodies: Secondary | ICD-10-CM | POA: Diagnosis not present

## 2016-08-22 DIAGNOSIS — J181 Lobar pneumonia, unspecified organism: Secondary | ICD-10-CM | POA: Diagnosis not present

## 2016-08-22 DIAGNOSIS — R488 Other symbolic dysfunctions: Secondary | ICD-10-CM | POA: Diagnosis not present

## 2016-08-22 DIAGNOSIS — I480 Paroxysmal atrial fibrillation: Secondary | ICD-10-CM | POA: Diagnosis not present

## 2016-08-22 DIAGNOSIS — I69154 Hemiplegia and hemiparesis following nontraumatic intracerebral hemorrhage affecting left non-dominant side: Secondary | ICD-10-CM | POA: Diagnosis not present

## 2016-08-22 DIAGNOSIS — J189 Pneumonia, unspecified organism: Secondary | ICD-10-CM | POA: Diagnosis not present

## 2016-08-22 DIAGNOSIS — Z87898 Personal history of other specified conditions: Secondary | ICD-10-CM | POA: Diagnosis not present

## 2016-08-22 DIAGNOSIS — R1312 Dysphagia, oropharyngeal phase: Secondary | ICD-10-CM | POA: Diagnosis not present

## 2016-08-22 DIAGNOSIS — F0280 Dementia in other diseases classified elsewhere without behavioral disturbance: Secondary | ICD-10-CM | POA: Diagnosis not present

## 2016-08-22 DIAGNOSIS — I482 Chronic atrial fibrillation: Secondary | ICD-10-CM | POA: Diagnosis not present

## 2016-08-22 DIAGNOSIS — R1319 Other dysphagia: Secondary | ICD-10-CM | POA: Diagnosis not present

## 2016-08-22 DIAGNOSIS — F0150 Vascular dementia without behavioral disturbance: Secondary | ICD-10-CM | POA: Diagnosis not present

## 2016-08-22 DIAGNOSIS — Z7901 Long term (current) use of anticoagulants: Secondary | ICD-10-CM | POA: Diagnosis not present

## 2016-08-22 DIAGNOSIS — G4089 Other seizures: Secondary | ICD-10-CM | POA: Diagnosis not present

## 2016-08-22 MED ORDER — AMOXICILLIN-POT CLAVULANATE 500-125 MG PO TABS: 1.0000 | ORAL_TABLET | Freq: Three times a day (TID) | ORAL | 0 refills | Status: AC

## 2016-08-22 MED ORDER — GUAIFENESIN ER 600 MG PO TB12: 600.0000 mg | ORAL_TABLET | Freq: Two times a day (BID) | ORAL | 0 refills | Status: DC

## 2016-08-22 NOTE — Clinical Social Work Note (Signed)
Clinical Social Worker facilitated patient discharge including contacting patient family ([facility]) and facility ([facility]) to confirm patient discharge plans. Clinical information faxed to facility and family agreeable with plan.  CSW arranged ambulance transport via [facility] to [facility]. RN to call report prior to discharge.  Clinical Social Worker will sign off for now as social work intervention is no longer needed. Please consult [place] again if new need arises.  Ignazio Kincaid B. Joline Maxcy Clinical Social Work Dept Weekend Social Worker [phone] 10:51 AM

## 2016-08-22 NOTE — Progress Notes (Signed)
NURSING PROGRESS NOTE  [name] [id number] Discharge Data: 08/22/2016 1:58 PM Attending Provider: Lavina Hamman, MD [id number], [place], DO   [facility] to be D/C'd Nursing Home per MD order.    All IV's will be discontinued and monitored for bleeding.  All belongings will be returned to patient for patient to take home.  Last Documented Vital Signs:  Blood pressure 130/75, pulse 66, temperature 97.4 F (36.3 C), temperature source Oral, resp. rate 18, height 5\' 6"  (1.676 m), weight 70.8 kg (156 lb 1.6 oz), SpO2 100 %.  Joslyn Hy, MSN, RN, [employer]

## 2016-08-22 NOTE — Progress Notes (Signed)
Daily Progress Note   Patient Name: [name]       Date: 08/22/2016 DOB: [birth year]  Age: 83 y.o. MRN#: [id number] Attending Physician: Lavina Hamman, MD Primary Care Physician: Gildardo Cranker, DO Admit Date: 08/18/2016  Reason for Consultation/Follow-up: Establishing goals of care, Hospice Evaluation, Non pain symptom management, Pain control and Psychosocial/spiritual support  Subjective: Patient continues to be lethargic despite being treated for pneumonia, supported with IV fluids. Discussed with daughters again this morning that this is likely patient's new baseline related to her dementia, CVA as well as seizure disorder. Reviewed labs, CT scan with patient's daughters. Her focus is on comfort and not to return to the hospital.  Length of Stay: 4  Current Medications: Scheduled Meds:  . amoxicillin-clavulanate  1 tablet Oral TID  . apixaban  5 mg Oral BID  . atorvastatin  40 mg Oral q1800  . B-complex with vitamin C  1 tablet Oral BID  . calcium-vitamin D  1 tablet Oral BID  . cholecalciferol  2,000 Units Oral Daily  . divalproex  500 mg Oral BID  . guaiFENesin  600 mg Oral BID  . loratadine  10 mg Oral Daily  . multivitamin with minerals  1 tablet Oral Daily  . olopatadine  1 drop Both Eyes BID    Continuous Infusions:   PRN Meds: acetaminophen **OR** acetaminophen, albuterol, hydrocortisone  Physical Exam  Constitutional: She appears well-developed and well-nourished.  Cardiovascular:  Irregular  Pulmonary/Chest: Effort normal.  Neurological:  Lethargic  Skin: Skin is warm and dry. There is pallor.  Psychiatric:  Unable to test  Nursing note and vitals reviewed.           Vital Signs: BP 130/75 (BP Location: Right Wrist)   Pulse 66   Temp 97.4 F (36.3  C) (Oral)   Resp 18   Ht 5\' 6"  (1.676 m)   Wt 70.8 kg (156 lb 1.6 oz)   SpO2 100%   BMI 25.20 kg/m  SpO2: SpO2: 100 % O2 Device: O2 Device: Not Delivered O2 Flow Rate:    Intake/output summary:  Intake/Output Summary (Last 24 hours) at 08/22/16 1150 Last data filed at 08/22/16 0651  Gross per 24 hour  Intake              240 ml  Output  850 ml  Net             -610 ml   LBM: Last BM Date: 08/20/16 Baseline Weight: Weight: 68.1 kg (150 lb 1.6 oz) Most recent weight: Weight: 70.8 kg (156 lb 1.6 oz)       Palliative Assessment/Data:    Flowsheet Rows     Most Recent Value  Intake Tab  Referral Department  Hospitalist  Unit at Time of Referral  Med/Surg Unit  Palliative Care Primary Diagnosis  Other (Comment)  Date Notified  08/21/16  Palliative Care Type  New Palliative care  Reason for referral  Clarify Goals of Care  Date of Admission  08/18/16  Date first seen by Palliative Care  08/21/16  # of days Palliative referral response time  0 Day(s)  # of days IP prior to Palliative referral  3  Clinical Assessment  Palliative Performance Scale Score  30%  Pain Max last 24 hours  Not able to report  Pain Min Last 24 hours  Not able to report  Dyspnea Max Last 24 Hours  Not able to report  Dyspnea Min Last 24 hours  Not able to report  Nausea Max Last 24 Hours  Not able to report  Nausea Min Last 24 Hours  Not able to report  Anxiety Max Last 24 Hours  Not able to report  Anxiety Min Last 24 Hours  Not able to report  Other Max Last 24 Hours  Not able to report  Psychosocial & Spiritual Assessment  Palliative Care Outcomes  Patient/Family meeting held?  Yes  Who was at the meeting?  3 daughters and niece  Palliative Care Outcomes  Transitioned to hospice  Patient/Family wishes: Interventions discontinued/not started   Mechanical Ventilation, Hemodialysis, Vasopressors, PEG, Tube feedings/TPN, Trach  Palliative Care follow-up planned  Yes, Facility       Patient Active Problem List   Diagnosis Date Noted  . Aphasia   . Lewy body dementia without behavioral disturbance   . Palliative care by specialist   . HCAP (healthcare-associated pneumonia) 08/18/2016  . History of seizure 08/18/2016  . Chronic atrial fibrillation ([place])   . CVA (cerebral infarction) 11/23/2015  . Vascular dementia 10/12/2015  . OAB (overactive bladder) 10/12/2015  . Protein-calorie malnutrition ([place]) 10/12/2015  . Senile osteoporosis 10/10/2015  . Acute encephalopathy 08/12/2015  . Acute cystitis without hematuria   . HLD (hyperlipidemia)   . External hemorrhoids without complication 10/62/6948  . Abdominal pain 12/18/2014  . Nausea without vomiting 12/18/2014  . UTI (lower urinary tract infection) 12/18/2014  . Urinary tract infectious disease   . Chest pain 10/31/2014  . Nausea and vomiting 10/31/2014  . Nausea with vomiting   . Mild cognitive impairment with memory loss 07/25/2014  . Malnourished ([place]) 07/25/2014  . Recurrent UTI 08/07/2013  . History of CVA (cerebrovascular accident) 06/02/2013  . Unspecified constipation 03/16/2013  . Left-sided weakness 03/09/2013  . Possible Seizures 02/14/2013  . TIA (transient ischemic attack) 02/12/2013  . Hyperglycemia 02/12/2013  . High cholesterol   . Acute CVA (cerebrovascular accident) ([place]) 02/05/2013  . Paroxysmal atrial fibrillation ([place]) 02/05/2013  . Chronic anticoagulation 02/05/2013  . HTN (hypertension) 11/14/2012  . Right ureteral stone 09/15/2010  . Atrial flutter ([place]) 07/17/2010  . Osteopenia of the elderly 06/03/2009  . Migraine 02/05/2008  . CATARACTS, BILATERAL 02/05/2008  . DECREASED HEARING 02/05/2008  . GEN OSTEOARTHROSIS INVOLVING MULTIPLE SITES 02/05/2008  . COLONIC POLYPS, HX OF 02/05/2008    Palliative Care  Assessment & Plan   Patient Profile: 83 y.o. female  with past medical history of CVA 3, atrial fib, seizures, severe dementia, recurrent UTI, debility admitted on  08/18/2016 with altered mental status and aphasia.. Chest x-ray was showing infiltrates concerning for pneumonia and patient was admitted for further management of healthcare associated pneumonia. Despite treatment with antibiotics, IV fluids, patient has remained less interactive, lethargic. Family excepting that this may be patient's new baseline secondary to her advanced dementia, previous medical history of CVAs as well as seizure disorder   Recommendations/Plan:  Return to [facility] with hospice supporting patient and family in the facility  Order placed to social work on 08/21/2016  Goals of Care and Additional Recommendations:  Limitations on Scope of Treatment: Full Comfort Care  Code Status:    Code Status Orders        Start     Ordered   08/18/16 2129  Do not attempt resuscitation (DNR)  Continuous    Question Answer Comment  In the event of cardiac or respiratory ARREST Do not call a "code blue"   In the event of cardiac or respiratory ARREST Do not perform Intubation, CPR, defibrillation or ACLS   In the event of cardiac or respiratory ARREST Use medication by any route, position, wound care, and other measures to relive pain and suffering. May use oxygen, suction and manual treatment of airway obstruction as needed for comfort.      08/18/16 2130    Code Status History    Date Active Date Inactive Code Status Order ID Comments User Context   11/26/2015 12:26 AM 11/26/2015  9:47 PM DNR [id number]  Asencion Partridge, MD Inpatient   11/24/2015 12:38 AM 11/24/2015  1:59 PM DNR [id number]  Maryellen Pile, MD Inpatient   08/07/2015  4:52 AM 08/12/2015  8:32 PM Partial Code [id number]  Edwin Dada, MD Inpatient   12/18/2014  7:17 PM 12/21/2014  2:11 PM Partial Code [id number]  Willia Craze, NP Inpatient   10/31/2014  3:32 AM 10/31/2014  7:24 PM Full Code [id number]  Theressa Millard, MD Inpatient   08/28/2013 12:58 AM 08/30/2013  4:40 PM Full Code [id number]  Kelvin Cellar,  MD Inpatient   03/09/2013  6:50 PM 03/12/2013  6:27 PM Full Code [id number]  Verlee Monte, MD Inpatient   02/15/2013  6:26 PM 03/03/2013 12:40 PM Full Code [id number]  Cathlyn Parsons, PA-C Inpatient   02/12/2013 12:50 PM 02/15/2013  6:26 PM Full Code [id number]  Velvet Bathe, MD Inpatient   02/05/2013  7:35 PM 02/07/2013  8:48 PM Full Code [id number]  Kelvin Cellar, MD Inpatient   08/01/2012  5:49 PM 08/03/2012  2:28 PM Full Code [id number]  Melton Alar, PA-C Inpatient    Advance Directive Documentation     Most Recent Value  Type of Advance Directive  Out of facility DNR (pink MOST or yellow form)  Pre-existing out of facility DNR order (yellow form or pink MOST form)  Yellow form placed in chart (order not valid for inpatient use)  "MOST" Form in Place?  -       Prognosis:   < 6 months in the setting of advanced dementia, severe vascular disorder as evidenced by 3 CVAs, debility, protein calorie malnutrition with albumin of 2.1  Discharge Planning:  [place] with Hospice  Care plan was discussed with Dr. Posey Pronto  Thank you for allowing the Palliative Medicine Team to assist in the care of this patient.   Time  In: 1030 Time Out: 1055 Total Time 25 min Prolonged Time Billed  no       Greater than 50%  of this time was spent counseling and coordinating care related to the above assessment and plan.  Dory Horn, NP  Please contact Palliative Medicine Team phone at [phone] for questions and concerns.

## 2016-08-22 NOTE — Discharge Summary (Signed)
[facility] Discharge Summary   Patient: [name] VZC:[id number]   PCP: Gildardo Cranker, DO DOB: 15-Sep-1924   Date of admission: 08/18/2016   Date of discharge:  08/22/2016    Discharge Diagnoses:  Principal Problem:   HCAP (healthcare-associated pneumonia) Active Problems:   Paroxysmal atrial fibrillation ([place])   History of CVA (cerebrovascular accident)   History of seizure   Aphasia   Lewy body dementia without behavioral disturbance   Palliative care by specialist   Admitted From: SNF Disposition:  SNF  Recommendations for Outpatient Follow-up:  1. Establish care with hospice at [facility]    Contact information for follow-up providers    Gildardo Cranker, DO. Schedule an appointment as soon as possible for a visit in 1 week(s).   Specialty:  Internal Medicine Contact information: [place] [id number] [phone]        hospice Follow up.   Why:  establish care           Contact information for after-discharge care    Destination    [facility] Follow up.   Specialty:  Skilled Nursing Facility Contact information: [place] [phone]                 Diet recommendation: Regular diet  Activity: The patient is advised to gradually reintroduce usual activities.  Discharge Condition: good  Code Status: DNR/DNI  History of present illness: As per the H and P dictated on admission, "SANAI FRICK is a 83 y.o. female with history of atrial fibrillation, complex partial seizures and dementia bedbound was brought to the ER after patient's family found the patient was getting less conversant and increasingly lethargic. This was seen over the last 1 week. There was no seizure-like activity is noted. Patient had minimal cough. Patient is otherwise usually bedbound."  Hospital Course:  Summary of her active problems in the hospital is as following. 1. Acute encephalopathy. Likely delirium  associated with infection. Progression of dementia. Presented with confusion, poor responsiveness. CT of the head was unremarkable. MRI brain was also unremarkable for any acute stroke. Depakote level and ammonia level also stable. No evidence of nutritional deficiency. This appears to be progression of dementia and patient's new baseline. I discussed with palliative care who in turn discussed with the family and patient and now agreeable to hospice at rehabilitation.  2. Healthcare associated pneumonia. Patient was started on vancomycin and cefepime. No blood cultures performed. At present I do not suspect that the patient has an active sepsis. The transition the patient to oral Levaquin. Her encephalopathy appears to have worsened on Levaquin I would also change her antibiotics from Levaquin to Augmentin since that could have some CNS toxicity.  2. Chronic A. fib. Continue Apixaban. Stroke risk or more than 2.  3. History of complex partial seizure. No active seizures at present. Depakote level normal ammonia level normal. Continue current regimen. Dose recently increased in last visit.  4. Dementia. Currently no behavioral symptoms. Continue to monitor. This actually appears to be her new baseline. I recommended palliative care consult as well as approach of SNF with hospice. Which family agreeable to.  All other chronic medical condition were stable during the hospitalization.  Patient was from SNF, which was arranged by Education officer, museum and case Freight forwarder. On the day of the discharge the patient's vitals were stable, and no other acute medical condition were reported by patient. the patient was felt safe to be  discharge at SNF with hospice.  Procedures and Results:  none   Consultations:  Palliative care  DISCHARGE MEDICATION: Current Discharge Medication List    START taking these medications   Details  amoxicillin-clavulanate (AUGMENTIN) 500-125 MG tablet Take 1  tablet (500 mg total) by mouth 3 (three) times daily. Qty: 6 tablet, Refills: 0    guaiFENesin (MUCINEX) 600 MG 12 hr tablet Take 1 tablet (600 mg total) by mouth 2 (two) times daily. Qty: 30 tablet, Refills: 0      CONTINUE these medications which have NOT CHANGED   Details  acetaminophen (TYLENOL) 500 MG tablet Take 500 mg by mouth every 8 (eight) hours as needed.    albuterol (PROVENTIL) (2.5 MG/3ML) 0.083% nebulizer solution Take 2.5 mg by nebulization 3 (three) times daily as needed for wheezing or shortness of breath.    apixaban (ELIQUIS) 5 MG TABS tablet Take 1 tablet (5 mg total) by mouth 2 (two) times daily. Qty: 60 tablet, Refills: 0    atorvastatin (LIPITOR) 40 MG tablet Take 1 tablet (40 mg total) by mouth daily at 6 PM. Qty: 30 tablet, Refills: 0    b complex vitamins tablet Take 1 tablet by mouth 2 (two) times daily.     Calcium Carb-Cholecalciferol (CALCIUM 600 + D) 600-200 MG-UNIT TABS Take 1 tablet by mouth 2 (two) times daily.    cetirizine (ZYRTEC) 10 MG tablet Take 10 mg by mouth at bedtime.     Cholecalciferol (VITAMIN D3) 2000 units TABS Take 2,000 Units by mouth daily.     Cranberry 250 MG CAPS Take 2 capsules by mouth 2 (two) times daily. Take 2 capsules to = 500 mg po qd    divalproex (DEPAKOTE SPRINKLE) 125 MG capsule Take 500 mg by mouth 2 (two) times daily.    hydrocortisone (ANUSOL-HC) 2.5 % rectal cream Place 1 application rectally as needed for hemorrhoids or itching.    hydrocortisone (ANUSOL-HC) 25 MG suppository Place 25 mg rectally as needed for hemorrhoids or itching.    Melatonin 3 MG TABS Take 3 mg by mouth at bedtime.     Multiple Vitamins-Minerals (MULTIVITAMIN WITH MINERALS) tablet Take 1 tablet by mouth daily.    olopatadine (PATANOL) 0.1 % ophthalmic solution PLACE 1 DROP INTO BOTH EYES 2 (TWO) TIMES DAILY. Qty: 5 mL, Refills: 1    ondansetron (ZOFRAN) 4 MG tablet Take 1 tablet (4 mg total) by mouth every 6 (six) hours as needed  for nausea. Qty: 10 tablet, Refills: 0       Allergies  Allergen Reactions  . Aricept [Donepezil Hcl] Hypertension    With N/V  . Tramadol Hcl Other (See Comments)     lethargy, nausea  . Alendronate Sodium Rash and Other (See Comments)    puffy eyes   Discharge Instructions    Diet - low sodium heart healthy    Complete by:  As directed    Discharge instructions    Complete by:  As directed    It is important that you read following instructions as well as go over your medication list with RN to help you understand your care after this hospitalization.  Discharge Instructions: Please follow-up with PCP in one week  Please request your primary care physician to go over all Hospital Tests and Procedure/Radiological results at the follow up,  Please get all Hospital records sent to your PCP by signing hospital release before you go home.   Do not take more than prescribed Pain, Sleep and  Anxiety Medications. You were cared for by a hospitalist during your hospital stay. If you have any questions about your discharge medications or the care you received while you were in the hospital after you are discharged, you can call the unit and ask to speak with the hospitalist on call if the hospitalist that took care of you is not available.  Once you are discharged, your primary care physician will handle any further medical issues. Please note that NO REFILLS for any discharge medications will be authorized once you are discharged, as it is imperative that you return to your primary care physician (or establish a relationship with a primary care physician if you do not have one) for your aftercare needs so that they can reassess your need for medications and monitor your lab values. You Must read complete instructions/literature along with all the possible adverse reactions/side effects for all the Medicines you take and that have been prescribed to you. Take any new Medicines after you have  completely understood and accept all the possible adverse reactions/side effects.   Increase activity slowly    Complete by:  As directed      Discharge Exam: Filed Weights   08/21/16 0500 08/21/16 1622 08/22/16 0759  Weight: 70.5 kg (155 lb 8 oz) 95 kg (209 lb 6.4 oz) 70.8 kg (156 lb 1.6 oz)   Vitals:   08/21/16 2208 08/22/16 0657  BP: (!) 147/62 130/75  Pulse: 99 66  Resp: 18 18  Temp: 97.7 F (36.5 C) 97.4 F (36.3 C)   General: Appear in mild distress, no Rash; Oral Mucosa moist. Cardiovascular: S1 and S2 Present, aortic systolic Murmur, difficult to assess JVD Respiratory: Bilateral Air entry present and Clear to Auscultation, no Crackles, no wheezes Abdomen: Bowel Sound present, Soft and no tenderness Extremities: no Pedal edema, no calf tenderness Neurology: Grossly no focal neuro deficit.  The results of significant diagnostics from this hospitalization (including imaging, microbiology, ancillary and laboratory) are listed below for reference.    Significant Diagnostic Studies: Ct Head Wo Contrast  Result Date: 08/18/2016 CLINICAL DATA:  Altered mental status EXAM: CT HEAD WITHOUT CONTRAST TECHNIQUE: Contiguous axial images were obtained from the base of the skull through the vertex without intravenous contrast. COMPARISON:  MRI 11/23/2015, CT brain 11/23/2015 FINDINGS: Brain: No definite acute territorial infarction, hemorrhage or intracranial mass is seen. Interim finding of left frontal lobe and parasagittal encephalomalacia at the site of prior 2017 MRI demonstrated infarct. Additional old infarcts in the right basal ganglia, right cerebellum, and left posterior parietal lobe. Extensive periventricular and subcortical small vessel ischemic changes. Moderate atrophy. Stable ventricular size. Vascular: No hyperdense vessels.  Carotid artery calcifications. Skull: No fracture or suspicious bone lesion. Fluid in the inferior mastoids. Sinuses/Orbits: Mucosal opacification of  the sphenoid sinuses. No acute orbital abnormality. Bilateral lens extraction. Other: None IMPRESSION: 1. No definite CT evidence for acute intracranial abnormality. 2. Old bilateral multifocal infarcts. Extensive small vessel ischemic changes of the white matter. 3. Sphenoid sinus disease. Electronically Signed   By: Donavan Foil M.D.   On: 08/18/2016 18:25   Mr Brain Wo Contrast  Result Date: 08/18/2016 CLINICAL DATA:  Aphasia and altered mental status EXAM: MRI HEAD WITHOUT CONTRAST TECHNIQUE: Multiplanar, multiecho pulse sequences of the brain and surrounding structures were obtained without intravenous contrast. COMPARISON:  Head CT 08/18/2016 Brain MRI 11/23/2015 FINDINGS: Brain: The midline structures are normal. There is no focal diffusion restriction to indicate acute infarct. There is diffuse confluent hyperintense T2-weighted  signal within the periventricular and deep white matter, most often seen in the setting of chronic microvascular ischemia. There is an old left anterior cerebral artery distribution infarct. There is also a small, old right cerebellar infarct. No intraparenchymal hematoma or chronic microhemorrhage. There is generalized brain volume loss without a specific lobar predilection. The dura is normal and there is no extra-axial collection. Vascular: Major intracranial arterial and venous sinus flow voids are preserved. Skull and upper cervical spine: The visualized skull base, calvarium, upper cervical spine and extracranial soft tissues are normal. Sinuses/Orbits: Left sphenoethmoid retained secretions. Normal orbits. IMPRESSION: 1. Advanced chronic microvascular ischemia and multiple old infarcts without acute intracranial abnormality. 2. Diffuse atrophy without a specific lobar predominance. Electronically Signed   By: Ulyses Jarred M.D.   On: 08/18/2016 21:09   Dg Chest Port 1 View  Result Date: 08/18/2016 CLINICAL DATA:  Altered mental status for 2 days EXAM: PORTABLE CHEST 1  VIEW COMPARISON:  09/12/2015 FINDINGS: Cardiac shadow is at the upper limits of normal in size. Aortic calcifications are again seen. Left basilar infiltrate with associated effusion is noted new from the prior exam. No bony abnormality is seen. IMPRESSION: Left basilar infiltrate with associated effusion. Electronically Signed   By: Inez Catalina M.D.   On: 08/18/2016 16:54    Microbiology: Recent Results (from the past 240 hour(s))  MRSA PCR Screening     Status: Abnormal   Collection Time: 08/19/16 12:02 AM  Result Value Ref Range Status   MRSA by PCR INVALID RESULTS, SPECIMEN SENT FOR CULTURE (A) NEGATIVE Final    Comment:        The GeneXpert MRSA Assay (FDA approved for NASAL specimens only), is one component of a comprehensive MRSA colonization surveillance program. It is not intended to diagnose MRSA infection nor to guide or monitor treatment for MRSA infections. CALLED TO D.GARDNER,RN AT 0359 BY L.PITT 08/19/16   MRSA culture     Status: None   Collection Time: 08/19/16 12:02 AM  Result Value Ref Range Status   Specimen Description NOSE  Final   Special Requests NONE  Final   Culture NO MRSA DETECTED  Final   Report Status 08/20/2016 FINAL  Final     Labs: CBC:  Recent Labs Lab 08/18/16 1641 08/19/16 0440 08/20/16 0506 08/21/16 1333  WBC 5.7 5.6 5.7 7.7  NEUTROABS 3.6 2.7 2.5 5.0  HGB 11.0* 10.9* 11.2* 11.6*  HCT 35.2* 35.5* 36.6 36.9  MCV 84.6 85.5 85.9 85.8  PLT 252 229 252 809   Basic Metabolic Panel:  Recent Labs Lab 08/18/16 1641 08/19/16 0440 08/20/16 0506 08/21/16 1333  NA 138 142 142 138  K 4.8 3.7 4.2 4.2  CL 104 106 105 104  CO2 27 31 30 27   GLUCOSE 172* 93 102* 217*  BUN 18 14 8 14   CREATININE 0.66 0.62 0.54 0.78  CALCIUM 8.5* 8.4* 8.6* 8.5*  MG  --   --  2.0  --    Liver Function Tests:  Recent Labs Lab 08/18/16 1641 08/19/16 0440 08/20/16 0506  AST 30 21 20   ALT 11* 11* 10*  ALKPHOS 76 67 74  BILITOT 0.8 0.3 0.4  PROT  5.5* 5.3* 5.3*  ALBUMIN 2.1* 2.2* 2.1*   No results for input(s): LIPASE, AMYLASE in the last 168 hours.  Recent Labs Lab 08/18/16 2202  AMMONIA 26   Cardiac Enzymes: No results for input(s): CKTOTAL, CKMB, CKMBINDEX, TROPONINI in the last 168 hours. BNP (last 3 results) No  results for input(s): BNP in the last 8760 hours. CBG: No results for input(s): GLUCAP in the last 168 hours. Time spent: 35 minutes  Signed:  PATEL, PRANAV  [facility]  08/22/2016  , 11:15 AM

## 2016-08-22 NOTE — Clinical Social Work Placement (Signed)
   CLINICAL SOCIAL WORK PLACEMENT  NOTE  Date:  08/22/2016  Patient Details  Name: [name] MRN: [id number] Date of Birth: 22-Feb-1925  Clinical Social Work is seeking post-discharge placement for this patient at the [place] level of care (*CSW will initial, date and re-position this form in  chart as items are completed):  Yes   Patient/family provided with [facility] Work Department's list of facilities offering this level of care within the geographic area requested by the patient (or if unable, by the patient's family).  Yes   Patient/family informed of their freedom to choose among providers that offer the needed level of care, that participate in Medicare, Medicaid or managed care program needed by the patient, have an available bed and are willing to accept the patient.  Yes   Patient/family informed of [facility]'s ownership interest in [facility] and [facility], as well as of the fact that they are under no obligation to receive care at these facilities.  PASRR submitted to EDS on       PASRR number received on       Existing PASRR number confirmed on 08/19/16     FL2 transmitted to all facilities in geographic area requested by pt/family on       FL2 transmitted to all facilities within larger geographic area on       Patient informed that his/her managed care company has contracts with or will negotiate with certain facilities, including the following:        Yes   Patient/family informed of bed offers received.  Patient chooses bed at  ([facility]     Physician recommends and patient chooses bed at      Patient to be transferred to  [facility]) on 08/22/16.  Patient to be transferred to facility by  [facility])     Patient family notified on 08/22/16 of transfer.  Name of family member notified:  Dtr @ Bedside and called [facility]     PHYSICIAN       Additional Comment:     _______________________________________________ Serafina Mitchell, LCSWA 08/22/2016, 10:55 AM

## 2016-08-24 ENCOUNTER — Non-Acute Institutional Stay (SKILLED_NURSING_FACILITY): Payer: Medicare Other | Admitting: Adult Health

## 2016-08-24 ENCOUNTER — Encounter: Payer: Self-pay | Admitting: Adult Health

## 2016-08-24 DIAGNOSIS — F0280 Dementia in other diseases classified elsewhere without behavioral disturbance: Secondary | ICD-10-CM | POA: Diagnosis not present

## 2016-08-24 DIAGNOSIS — I6319 Cerebral infarction due to embolism of other precerebral artery: Secondary | ICD-10-CM | POA: Diagnosis not present

## 2016-08-24 DIAGNOSIS — E785 Hyperlipidemia, unspecified: Secondary | ICD-10-CM

## 2016-08-24 DIAGNOSIS — Z7901 Long term (current) use of anticoagulants: Secondary | ICD-10-CM | POA: Diagnosis not present

## 2016-08-24 DIAGNOSIS — I10 Essential (primary) hypertension: Secondary | ICD-10-CM | POA: Diagnosis not present

## 2016-08-24 DIAGNOSIS — G3183 Dementia with Lewy bodies: Secondary | ICD-10-CM

## 2016-08-24 DIAGNOSIS — I480 Paroxysmal atrial fibrillation: Secondary | ICD-10-CM | POA: Diagnosis not present

## 2016-08-24 DIAGNOSIS — J189 Pneumonia, unspecified organism: Secondary | ICD-10-CM

## 2016-08-24 NOTE — Progress Notes (Signed)
Location:   [facility] Room Number: 203-B Place of Service:  SNF (31)   CODE STATUS: dnr  Allergies  Allergen Reactions  . Aricept [Donepezil Hcl] Hypertension    With N/V  . Tramadol Hcl Other (See Comments)     lethargy, nausea  . Alendronate Sodium Rash and Other (See Comments)    puffy eyes    Chief Complaint  Patient presents with  . Hospitalization Follow-up    HPI:  She is a long term resident of this facility who has been hospitalized for pneumonia. She is unable to participate in the hpi or ros due to her dementia. There are no nursing concerns at this time.    Past Medical History:  Diagnosis Date  . Acute cystitis without hematuria   . Acute encephalopathy   . Arthritis   . Atrial fibrillation ([place])    on Eliquis Rx  . Atrial flutter ([place]) January, 2012  . Chronic anticoagulation   . Colon polyp   . Constipation   . Hearing difficulty   . Hemorrhoids   . High cholesterol   . HLD (hyperlipidemia)   . Hypertension   . Left leg weakness 02/05/2013  . Left-sided weakness   . Malnutrition ([place])   . Migraine   . Mild cognitive impairment with memory loss   . Osteoporosis   . Seizures ([place]) 06/26/2016  . Stroke ([place]) 08/02/12    right lenticular nucleus infarct, 2 strokes  . TIA (transient ischemic attack)   . Torus palatinus     Past Surgical History:  Procedure Laterality Date  . APPENDECTOMY  02/19/53   `  . CARDIOVERSION  05/19/2010   Dr. Einar Gip  . CATARACT EXTRACTION W/ INTRAOCULAR LENS  IMPLANT, BILATERAL  2006-2008  . GANGLION CYST EXCISION Bilateral 1938,1954,2003,2005   "wrists/hand" (08/01/2012)  . MOUTH SURGERY     [name]  . TONSILLECTOMY  ~ [place]    Social History   Social History  . Marital status: Widowed    Spouse name: N/A  . Number of children: 3  . Years of education: college   Occupational History  . 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 Smoking status: Former Research scientist (life sciences)    . Smokeless tobacco: Never Used  . Alcohol use No     Comment: 08/01/2012 "glass of wine on special occasions"  . Drug use: No  . Sexual activity: No   Other Topics Concern  . Not on file   Social History Narrative   Patient lives at home with daughters.   Caffeine use: 1/2-1 cup of coffee occasionally         Diet: Regular      Do you drink/ eat things with caffeine? Yes      Marital status:   Widowed                            What year were you married ?       Do you live in a house, apartment,assistred living, condo, trailer, etc.)? House      Is it one or more stories? Yes      How many persons live in your home ? 4      Do you have any pets in your home ?(please list) Yes/ 1 Dog, 2 Cats      Current or past profession: Editor      Do you exercise?  No                             Type & how often:      Do you have a living will? Yes      Do you have a DNR form?  Yes                     If not, do you want to discuss one?       Do you have signed POA?HPOA forms? Yes                If so, please bring to your        appointment      regular   Family History  Problem Relation Age of Onset  . Cancer Mother        breast  . Aneurysm Mother        brain  . Stroke Mother   . Colon cancer Father   . Breast cancer Unknown   . Brain cancer Unknown   . Heart attack Neg Hx   . Diabetes Neg Hx   . Hypertension Neg Hx       VITAL SIGNS BP 107/80   Pulse 64   Temp 97.8 F (36.6 C) (Oral)   Resp 18   Ht 5\' 6"  (1.676 m)   Wt 148 lb 3.2 oz (67.2 kg)   SpO2 97%   BMI 23.92 kg/m   Patient's Medications  New Prescriptions   No medications on file  Previous Medications   ACETAMINOPHEN (TYLENOL) 500 MG TABLET    Take 500 mg by mouth every 8 (eight) hours as needed.   ALBUTEROL (PROVENTIL) (2.5 MG/3ML) 0.083% NEBULIZER SOLUTION    Take 2.5 mg by nebulization 3 (three) times daily as needed for wheezing or shortness of breath.   AMOXICILLIN-CLAVULANATE  (AUGMENTIN) 500-125 MG TABLET    Take 1 tablet (500 mg total) by mouth 3 (three) times daily.   APIXABAN (ELIQUIS) 5 MG TABS TABLET    Take 1 tablet (5 mg total) by mouth 2 (two) times daily.   ATORVASTATIN (LIPITOR) 40 MG TABLET    Take 1 tablet (40 mg total) by mouth daily at 6 PM.   B COMPLEX VITAMINS TABLET    Take 1 tablet by mouth 2 (two) times daily.    CALCIUM CARB-CHOLECALCIFEROL (CALCIUM 600 + D) 600-200 MG-UNIT TABS    Take 1 tablet by mouth 2 (two) times daily.   CETIRIZINE (ZYRTEC) 10 MG TABLET    Take 10 mg by mouth at bedtime.    CHOLECALCIFEROL (VITAMIN D3) 2000 UNITS TABS    Take 2,000 Units by mouth daily.    CRANBERRY 250 MG CAPS    Take 2 capsules by mouth 2 (two) times daily. Take 2 capsules to = 500 mg po qd   DIVALPROEX (DEPAKOTE SPRINKLE) 125 MG CAPSULE    Take 500 mg by mouth 2 (two) times daily.   GUAIFENESIN (MUCINEX) 600 MG 12 HR TABLET    Take 1 tablet (600 mg total) by mouth 2 (two) times daily.   HYDROCORTISONE (ANUSOL-HC) 2.5 % RECTAL CREAM    Place 1 application rectally as needed for hemorrhoids or itching.   HYDROCORTISONE (ANUSOL-HC) 25 MG SUPPOSITORY    Place 25 mg rectally as needed for hemorrhoids or itching.   MELATONIN 3 MG TABS    Take 3 mg by mouth at bedtime.  MULTIPLE VITAMINS-MINERALS (MULTIVITAMIN WITH MINERALS) TABLET    Take 1 tablet by mouth daily.   OLOPATADINE (PATANOL) 0.1 % OPHTHALMIC SOLUTION    PLACE 1 DROP INTO BOTH EYES 2 (TWO) TIMES DAILY.   ONDANSETRON (ZOFRAN) 4 MG TABLET    Take 1 tablet (4 mg total) by mouth every 6 (six) hours as needed for nausea.  Modified Medications   No medications on file  Discontinued Medications   No medications on file     SIGNIFICANT DIAGNOSTIC EXAMS  08-18-16: mri of brain: 1. Advanced chronic microvascular ischemia and multiple old infarcts without acute intracranial abnormality. 2. Diffuse atrophy without a specific lobar predominance.   08-18-16: ct of head: 1. No definite CT evidence for acute  intracranial abnormality. 2. Old bilateral multifocal infarcts. Extensive small vessel ischemic changes of the white matter. 3. Sphenoid sinus disease.  08-18-16: chest x-ray: Left basilar infiltrate with associated effusion.  08-19-16: EEG: Clinical Correlation of the above findings indicates diffuse cerebral dysfunction that is non-specific in etiology and can be seen with hypoxic/ischemic injury, toxic/metabolic encephalopathies, neurodegenerative disorders, or medication effect.  The absence of epileptiform discharges does not rule out a clinical diagnosis of epilepsy.  Clinical correlation is advised.   LABS REVIEWED:   08-18-16: wbc 5.7 hgb 11.0; hct 35.2; mcv 84.6; plt 252; glucose 172; bun 18; creat 0.66; k+ 4.8; na++ 138; ca 8.5; liver normal albumin 2.1; depaktoe 65; ammonia 26; tsh 1.302  08-20-16: wbc 5.7; hgb 11.2; hct 36.6; mcv 85.9; plt 252; glucose 102; bun 8; creat 0.54; k+ 4.2; na++ 142; ca 8.6; liver normal albumin 2.1; mag 2.0 08-21-16: wbc 7.7; hbg 11.6; hct 36.9; mcv 85.8; plt 255; glucose 217; bun 14; creat 0.78; k+ 4.2; na++ 138; ca 8.5     Review of Systems  Unable to perform ROS: Dementia     Physical Exam  Constitutional: No distress.  Frail   Eyes: Conjunctivae are normal.  Neck: Neck supple. No JVD present. No thyromegaly present.  Cardiovascular: Normal rate, regular rhythm and intact distal pulses.   Respiratory: Effort normal and breath sounds normal. No respiratory distress. She has no wheezes.  GI: Soft. Bowel sounds are normal. She exhibits no distension. There is no tenderness.  Musculoskeletal: She exhibits no edema.  Right arm contracture Leans head to right  Bilateral provon boots in place   Lymphadenopathy:    She has no cervical adenopathy.  Neurological: She is alert.  Skin: Skin is warm and dry. She is not diaphoretic.  Psychiatric: She has a normal mood and affect.    ASSESSMENT/ PLAN:  1. Pneumonia: will complete augmentin 500 mg  three times daily and mucinex twice daily has albuterol neb three times daily as needed  will monitor  2. Chronic afib: heart rate stable will continue eliquis 5 mg twice daily   3. Dyslipidemia: will continue lipitor 40 mg daily   4. CVA: is neurologically stable; will continue eliquis 5 mg twice daily   5. Seizure: no reports of seizure activity: depakote level 65; will continue depakote 500 mg twice daily   6. Allergic rhinitis: will continue zyrtec 10 mg daily and patanol to both eyes twice daily   7. lewy body dementia: weight is 148 pounds; is currently not on medications will monitor    Time spent with patient  50  minutes >50% time spent counseling; reviewing medical record; tests; labs; and developing future plan of care    Ok Edwards NP [facility]  Contact  [phone] Monday through Friday 8am- 5pm  After hours call [phone]

## 2016-08-25 ENCOUNTER — Non-Acute Institutional Stay (SKILLED_NURSING_FACILITY): Payer: Medicare Other | Admitting: Internal Medicine

## 2016-08-25 ENCOUNTER — Encounter: Payer: Self-pay | Admitting: Internal Medicine

## 2016-08-25 DIAGNOSIS — Z87898 Personal history of other specified conditions: Secondary | ICD-10-CM

## 2016-08-25 DIAGNOSIS — F0150 Vascular dementia without behavioral disturbance: Secondary | ICD-10-CM

## 2016-08-25 DIAGNOSIS — I480 Paroxysmal atrial fibrillation: Secondary | ICD-10-CM

## 2016-08-25 DIAGNOSIS — J189 Pneumonia, unspecified organism: Secondary | ICD-10-CM

## 2016-08-25 NOTE — Progress Notes (Signed)
Location:  [place] Room Number: 203-B Place of Service:  SNF [phone]) Provider:  Jefm Miles, DO  Patient Care Team: Gildardo Cranker, DO as PCP - General (Internal Medicine)  Extended Emergency Contact Information Primary Emergency Contact: [facility] Address: [address]          [place], [place] [address] Johnnette Litter of [place] Phone: [phone] Mobile Phone: [phone] Relation: Daughter Secondary Emergency Contact: Plummer,Kim Address: [address], [place] Phone: [phone] Work Phone: [phone] Mobile Phone: [phone] Relation: Daughter  Code Status:  DNR Goals of care: Advanced Directive information Advanced Directives 08/18/2016  Does Patient Have a Medical Advance Directive? Yes  Type of Advance Directive Out of facility DNR (pink MOST or yellow form)  Does patient want to make changes to medical advance directive? No - Patient declined  Copy of [place] in Chart? -  Would patient like information on creating a medical advance directive? -  Pre-existing out of facility DNR order (yellow form or pink MOST form) Yellow form placed in chart (order not valid for inpatient use)     Chief Complaint  Patient presents with  . Readmit To SNF    Readmission Visit     HPI:  Pt is a 83 y.o. female seen today for medical management of chronic diseases.  And Readmission in SNF. Patient has h/o Chronic Atrial fibrillation, Complex Partial Seizure, Left hemiparesis 03/09/13 recurrent CVA and Dementia Patient was send to the Hospital per request of the family as they thought patient was lethargic. She was evaluated and found with Left Lower Lobe Pneumonia. She was started on Vanc and Cefuroxime but latter changed to Augmentin. Patient also had repeat MRI which showed Advanced chronic microvascular ischemia and multiple old infarcts without  acute intracranial abnormality Patient was send back to the facility. She is suppose to be at her baseline now. She follow some commands but mostly stares at you. She was seen by Palliative care nurse and was made DNR with Possible hospice care.  There were no new issues by nurses.      Past Medical History:  Diagnosis Date  . Acute cystitis without hematuria   . Acute encephalopathy   . Arthritis   . Atrial fibrillation ([place])    on Eliquis Rx  . Atrial flutter ([place]) January, 2012  . Chronic anticoagulation   . Colon polyp   . Constipation   . Hearing difficulty   . Hemorrhoids   . High cholesterol   . HLD (hyperlipidemia)   . Hypertension   . Left leg weakness 02/05/2013  . Left-sided weakness   . Malnutrition ([place])   . Migraine   . Mild cognitive impairment with memory loss   . Osteoporosis   . Seizures ([place]) 06/26/2016  . Stroke ([place]) 08/02/12    right lenticular nucleus infarct, 2 strokes  . TIA (transient ischemic attack)   . Torus palatinus    Past Surgical History:  Procedure Laterality Date  . APPENDECTOMY  02/19/53   `  . CARDIOVERSION  05/19/2010   Dr. Einar Gip  . CATARACT EXTRACTION W/ INTRAOCULAR LENS  IMPLANT, BILATERAL  2006-2008  . GANGLION CYST EXCISION Bilateral 1938,1954,2003,2005   "wrists/hand" (08/01/2012)  . MOUTH SURGERY     [name]  . TONSILLECTOMY  ~ 1935    Allergies  Allergen Reactions  . Aricept [Donepezil Hcl]  Hypertension    With N/V  . Tramadol Hcl Other (See Comments)     lethargy, nausea  . Alendronate Sodium Rash and Other (See Comments)    puffy eyes    Outpatient Encounter Prescriptions as of 08/25/2016  Medication Sig  . acetaminophen (TYLENOL) 325 MG tablet Take 650 mg by mouth every 4 (four) hours as needed for fever.  [place] Kitchen acetaminophen (TYLENOL) 500 MG tablet Take 500 mg by mouth every 8 (eight) hours as needed.  Marland Kitchen albuterol (PROVENTIL) (2.5 MG/3ML) 0.083% nebulizer solution Take 2.5 mg by nebulization 3 (three) times daily  as needed for wheezing or shortness of breath.  Marland Kitchen apixaban (ELIQUIS) 5 MG TABS tablet Take 1 tablet (5 mg total) by mouth 2 (two) times daily.  Marland Kitchen atorvastatin (LIPITOR) 40 MG tablet Take 1 tablet (40 mg total) by mouth daily at 6 PM.  . b complex vitamins tablet Take 1 tablet by mouth 2 (two) times daily.   . Calcium Carb-Cholecalciferol (CALCIUM 600 + D) 600-200 MG-UNIT TABS Take 1 tablet by mouth 2 (two) times daily.  . cetirizine (ZYRTEC) 10 MG tablet Take 10 mg by mouth at bedtime.   . Cholecalciferol (VITAMIN D3) 2000 units TABS Take 2,000 Units by mouth daily.   . Cranberry 250 MG CAPS Take 2 capsules by mouth daily. Take 2 capsules to = 500 mg po qd  . divalproex (DEPAKOTE SPRINKLE) 125 MG capsule Take 500 mg by mouth 2 (two) times daily.  Marland Kitchen guaiFENesin (MUCINEX) 600 MG 12 hr tablet Take 600 mg by mouth daily.  . hydrocortisone (ANUSOL-HC) 2.5 % rectal cream Place 1 application rectally as needed for hemorrhoids or itching.  . hydrocortisone (ANUSOL-HC) 25 MG suppository Place 25 mg rectally as needed for hemorrhoids or itching.  . Melatonin 3 MG TABS Take 3 mg by mouth at bedtime.   . Multiple Vitamins-Minerals (MULTIVITAMIN WITH MINERALS) tablet Take 1 tablet by mouth daily.  Marland Kitchen olopatadine (PATANOL) 0.1 % ophthalmic solution PLACE 1 DROP INTO BOTH EYES 2 (TWO) TIMES DAILY.  Marland Kitchen ondansetron (ZOFRAN) 4 MG tablet Take 1 tablet (4 mg total) by mouth every 6 (six) hours as needed for nausea.  . [DISCONTINUED] guaiFENesin (MUCINEX) 600 MG 12 hr tablet Take 1 tablet (600 mg total) by mouth 2 (two) times daily.   No facility-administered encounter medications on file as of 08/25/2016.     Review of Systems  Unable to perform ROS: Dementia    Immunization History  Administered Date(s) Administered  . Influenza Split 01/18/2011  . Influenza Whole 02/10/2009, 12/09/2009  . Influenza, High Dose Seasonal PF 01/26/2013  . Influenza,inj,Quad PF,36+ Mos 11/27/2013, 12/11/2014  . PPD Test  09/20/2014, 08/13/2015, 08/26/2015  . Pneumococcal Conjugate-13 10/19/2013  . Pneumococcal Polysaccharide-23 05/06/2009  . Td 02/05/2008  . Zoster 01/05/2014   Pertinent  Health Maintenance Due  Topic Date Due  . INFLUENZA VACCINE  10/27/2016  . DEXA SCAN  Completed  . PNA vac Low Risk Adult  Completed   Fall Risk  04/12/2016 10/16/2015 10/10/2015 09/26/2015 08/06/2015  Falls in the past year? No No Yes Yes Yes  Number falls in past yr: - 1 1 1 1   Injury with Fall? - No Yes Yes Yes  Risk for fall due to : - - - - -   Functional Status Survey:    Vitals:   08/25/16 1120  BP: 127/64  Pulse: 94  Resp: 20  Temp: 97.5 F (36.4 C)  TempSrc: Oral  SpO2: 96%  Weight:  148 lb 3.2 oz (67.2 kg)  Height: 5\' 6"  (1.676 m)   Body mass index is 23.92 kg/m. Physical Exam  Constitutional: She appears well-developed and well-nourished.  HENT:  Head: Normocephalic.  Mouth/Throat: Oropharynx is clear and moist.  Eyes: Pupils are equal, round, and reactive to light.  Neck: Neck supple.  Cardiovascular: Normal rate and normal heart sounds.  An irregularly irregular rhythm present.  No murmur heard. Pulmonary/Chest: Effort normal. No respiratory distress. She has no wheezes. She has rales.  Abdominal: Soft. Bowel sounds are normal. She exhibits no distension. There is no tenderness. There is no rebound.  Musculoskeletal: She exhibits no edema.  Lymphadenopathy:    She has no cervical adenopathy.  Neurological: She is alert.  Significant dementia. Could not check for any deficits. But has Left UE contracture.Moves her right extremities.  Skin: Skin is warm and dry.  Psychiatric:  Could not evaluate    Labs reviewed:  Recent Labs  09/12/15 1250  08/19/16 0440 08/20/16 0506 08/21/16 1333  NA 142  < > 142 142 138  K 4.2  < > 3.7 4.2 4.2  CL 104  < > 106 105 104  CO2 31  < > 31 30 27   GLUCOSE 89  < > 93 102* 217*  BUN 15  < > 14 8 14   CREATININE 0.72  < > 0.62 0.54 0.78  CALCIUM  9.1  < > 8.4* 8.6* 8.5*  MG 1.9  --   --  2.0  --   < > = values in this interval not displayed.  Recent Labs  08/18/16 1641 08/19/16 0440 08/20/16 0506  AST 30 21 20   ALT 11* 11* 10*  ALKPHOS 76 67 74  BILITOT 0.8 0.3 0.4  PROT 5.5* 5.3* 5.3*  ALBUMIN 2.1* 2.2* 2.1*    Recent Labs  08/19/16 0440 08/20/16 0506 08/21/16 1333  WBC 5.6 5.7 7.7  NEUTROABS 2.7 2.5 5.0  HGB 10.9* 11.2* 11.6*  HCT 35.5* 36.6 36.9  MCV 85.5 85.9 85.8  PLT 229 252 255   Lab Results  Component Value Date   TSH 1.302 08/18/2016   Lab Results  Component Value Date   HGBA1C 5.3 11/24/2015   Lab Results  Component Value Date   CHOL 122 03/15/2016   HDL 49 03/15/2016   LDLCALC 58 03/15/2016   TRIG 75 03/15/2016   CHOLHDL 3.7 11/24/2015    Significant Diagnostic Results in last 30 days:  Ct Head Wo Contrast  Result Date: 08/18/2016 CLINICAL DATA:  Altered mental status EXAM: CT HEAD WITHOUT CONTRAST TECHNIQUE: Contiguous axial images were obtained from the base of the skull through the vertex without intravenous contrast. COMPARISON:  MRI 11/23/2015, CT brain 11/23/2015 FINDINGS: Brain: No definite acute territorial infarction, hemorrhage or intracranial mass is seen. Interim finding of left frontal lobe and parasagittal encephalomalacia at the site of prior 2017 MRI demonstrated infarct. Additional old infarcts in the right basal ganglia, right cerebellum, and left posterior parietal lobe. Extensive periventricular and subcortical small vessel ischemic changes. Moderate atrophy. Stable ventricular size. Vascular: No hyperdense vessels.  Carotid artery calcifications. Skull: No fracture or suspicious bone lesion. Fluid in the inferior mastoids. Sinuses/Orbits: Mucosal opacification of the sphenoid sinuses. No acute orbital abnormality. Bilateral lens extraction. Other: None IMPRESSION: 1. No definite CT evidence for acute intracranial abnormality. 2. Old bilateral multifocal infarcts. Extensive small  vessel ischemic changes of the white matter. 3. Sphenoid sinus disease. Electronically Signed   By: Madie Reno.D.  On: 08/18/2016 18:25   Mr Brain Wo Contrast  Result Date: 08/18/2016 CLINICAL DATA:  Aphasia and altered mental status EXAM: MRI HEAD WITHOUT CONTRAST TECHNIQUE: Multiplanar, multiecho pulse sequences of the brain and surrounding structures were obtained without intravenous contrast. COMPARISON:  Head CT 08/18/2016 Brain MRI 11/23/2015 FINDINGS: Brain: The midline structures are normal. There is no focal diffusion restriction to indicate acute infarct. There is diffuse confluent hyperintense T2-weighted signal within the periventricular and deep white matter, most often seen in the setting of chronic microvascular ischemia. There is an old left anterior cerebral artery distribution infarct. There is also a small, old right cerebellar infarct. No intraparenchymal hematoma or chronic microhemorrhage. There is generalized brain volume loss without a specific lobar predilection. The dura is normal and there is no extra-axial collection. Vascular: Major intracranial arterial and venous sinus flow voids are preserved. Skull and upper cervical spine: The visualized skull base, calvarium, upper cervical spine and extracranial soft tissues are normal. Sinuses/Orbits: Left sphenoethmoid retained secretions. Normal orbits. IMPRESSION: 1. Advanced chronic microvascular ischemia and multiple old infarcts without acute intracranial abnormality. 2. Diffuse atrophy without a specific lobar predominance. Electronically Signed   By: Ulyses Jarred M.D.   On: 08/18/2016 21:09   Dg Chest Port 1 View  Result Date: 08/18/2016 CLINICAL DATA:  Altered mental status for 2 days EXAM: PORTABLE CHEST 1 VIEW COMPARISON:  09/12/2015 FINDINGS: Cardiac shadow is at the upper limits of normal in size. Aortic calcifications are again seen. Left basilar infiltrate with associated effusion is noted new from the prior exam. No  bony abnormality is seen. IMPRESSION: Left basilar infiltrate with associated effusion. Electronically Signed   By: Inez Catalina M.D.   On: 08/18/2016 16:54    Assessment/Plan  HCAP (healthcare-associated pneumonia) Patient is not on any antibiotics. But she needs at least 5 more days to get 7 days of antibiotics. Will restart Augmentin. Patient continues to stay afebrile and her White count is normal. Paroxysmal atrial fibrillation [facility])  Vascular dementia without behavioral disturbance Patient does have significant dementia She will need full Nursing care. Family has decided to consider Hospice will write the order.  History of seizure Continue on Depakote. Level was normal in Hospital.  Chronic Atrial fibrillation Rate controlled on Eliquis.     Family/ staff Communication:   Labs/tests ordered:  CBC and BMP Hospice consult

## 2016-08-27 DIAGNOSIS — R531 Weakness: Secondary | ICD-10-CM | POA: Diagnosis not present

## 2016-09-01 DIAGNOSIS — R531 Weakness: Secondary | ICD-10-CM | POA: Diagnosis not present

## 2016-09-27 DIAGNOSIS — R531 Weakness: Secondary | ICD-10-CM | POA: Diagnosis not present

## 2016-10-01 DIAGNOSIS — M6281 Muscle weakness (generalized): Secondary | ICD-10-CM | POA: Diagnosis not present

## 2016-10-01 DIAGNOSIS — B351 Tinea unguium: Secondary | ICD-10-CM | POA: Diagnosis not present

## 2016-10-01 DIAGNOSIS — D6832 Hemorrhagic disorder due to extrinsic circulating anticoagulants: Secondary | ICD-10-CM | POA: Diagnosis not present

## 2016-10-01 DIAGNOSIS — I739 Peripheral vascular disease, unspecified: Secondary | ICD-10-CM | POA: Diagnosis not present

## 2016-10-05 DIAGNOSIS — M24542 Contracture, left hand: Secondary | ICD-10-CM | POA: Diagnosis not present

## 2016-10-05 DIAGNOSIS — R278 Other lack of coordination: Secondary | ICD-10-CM | POA: Diagnosis not present

## 2016-10-06 DIAGNOSIS — H579 Unspecified disorder of eye and adnexa: Secondary | ICD-10-CM | POA: Diagnosis not present

## 2016-10-08 DIAGNOSIS — M24542 Contracture, left hand: Secondary | ICD-10-CM | POA: Diagnosis not present

## 2016-10-08 DIAGNOSIS — R278 Other lack of coordination: Secondary | ICD-10-CM | POA: Diagnosis not present

## 2016-10-11 DIAGNOSIS — R278 Other lack of coordination: Secondary | ICD-10-CM | POA: Diagnosis not present

## 2016-10-11 DIAGNOSIS — M24542 Contracture, left hand: Secondary | ICD-10-CM | POA: Diagnosis not present

## 2016-10-14 DIAGNOSIS — R278 Other lack of coordination: Secondary | ICD-10-CM | POA: Diagnosis not present

## 2016-10-14 DIAGNOSIS — M24542 Contracture, left hand: Secondary | ICD-10-CM | POA: Diagnosis not present

## 2016-10-15 DIAGNOSIS — R278 Other lack of coordination: Secondary | ICD-10-CM | POA: Diagnosis not present

## 2016-10-15 DIAGNOSIS — M24542 Contracture, left hand: Secondary | ICD-10-CM | POA: Diagnosis not present

## 2016-10-18 DIAGNOSIS — M24542 Contracture, left hand: Secondary | ICD-10-CM | POA: Diagnosis not present

## 2016-10-18 DIAGNOSIS — R278 Other lack of coordination: Secondary | ICD-10-CM | POA: Diagnosis not present

## 2016-10-19 DIAGNOSIS — M24542 Contracture, left hand: Secondary | ICD-10-CM | POA: Diagnosis not present

## 2016-10-19 DIAGNOSIS — R278 Other lack of coordination: Secondary | ICD-10-CM | POA: Diagnosis not present

## 2016-10-20 DIAGNOSIS — R278 Other lack of coordination: Secondary | ICD-10-CM | POA: Diagnosis not present

## 2016-10-20 DIAGNOSIS — M24542 Contracture, left hand: Secondary | ICD-10-CM | POA: Diagnosis not present

## 2016-10-22 DIAGNOSIS — M24542 Contracture, left hand: Secondary | ICD-10-CM | POA: Diagnosis not present

## 2016-10-22 DIAGNOSIS — R278 Other lack of coordination: Secondary | ICD-10-CM | POA: Diagnosis not present

## 2016-10-25 DIAGNOSIS — H109 Unspecified conjunctivitis: Secondary | ICD-10-CM | POA: Diagnosis not present

## 2016-10-26 DIAGNOSIS — R278 Other lack of coordination: Secondary | ICD-10-CM | POA: Diagnosis not present

## 2016-10-26 DIAGNOSIS — M24542 Contracture, left hand: Secondary | ICD-10-CM | POA: Diagnosis not present

## 2016-10-27 DIAGNOSIS — M24542 Contracture, left hand: Secondary | ICD-10-CM | POA: Diagnosis not present

## 2016-10-27 DIAGNOSIS — M79673 Pain in unspecified foot: Secondary | ICD-10-CM | POA: Diagnosis not present

## 2016-10-27 DIAGNOSIS — R278 Other lack of coordination: Secondary | ICD-10-CM | POA: Diagnosis not present

## 2016-10-27 DIAGNOSIS — E639 Nutritional deficiency, unspecified: Secondary | ICD-10-CM | POA: Diagnosis not present

## 2016-10-27 DIAGNOSIS — M79672 Pain in left foot: Secondary | ICD-10-CM | POA: Diagnosis not present

## 2016-10-27 DIAGNOSIS — R600 Localized edema: Secondary | ICD-10-CM | POA: Diagnosis not present

## 2016-10-28 DIAGNOSIS — R278 Other lack of coordination: Secondary | ICD-10-CM | POA: Diagnosis not present

## 2016-10-28 DIAGNOSIS — M24542 Contracture, left hand: Secondary | ICD-10-CM | POA: Diagnosis not present

## 2016-11-02 DIAGNOSIS — R278 Other lack of coordination: Secondary | ICD-10-CM | POA: Diagnosis not present

## 2016-11-02 DIAGNOSIS — M24542 Contracture, left hand: Secondary | ICD-10-CM | POA: Diagnosis not present

## 2016-11-03 DIAGNOSIS — R278 Other lack of coordination: Secondary | ICD-10-CM | POA: Diagnosis not present

## 2016-11-03 DIAGNOSIS — M24542 Contracture, left hand: Secondary | ICD-10-CM | POA: Diagnosis not present

## 2016-11-05 DIAGNOSIS — M24542 Contracture, left hand: Secondary | ICD-10-CM | POA: Diagnosis not present

## 2016-11-05 DIAGNOSIS — R278 Other lack of coordination: Secondary | ICD-10-CM | POA: Diagnosis not present

## 2016-11-09 DIAGNOSIS — M24542 Contracture, left hand: Secondary | ICD-10-CM | POA: Diagnosis not present

## 2016-11-09 DIAGNOSIS — R278 Other lack of coordination: Secondary | ICD-10-CM | POA: Diagnosis not present

## 2016-11-10 DIAGNOSIS — M109 Gout, unspecified: Secondary | ICD-10-CM | POA: Diagnosis not present

## 2016-11-11 DIAGNOSIS — Z79899 Other long term (current) drug therapy: Secondary | ICD-10-CM | POA: Diagnosis not present

## 2016-11-11 DIAGNOSIS — M24542 Contracture, left hand: Secondary | ICD-10-CM | POA: Diagnosis not present

## 2016-11-11 DIAGNOSIS — I10 Essential (primary) hypertension: Secondary | ICD-10-CM | POA: Diagnosis not present

## 2016-11-11 DIAGNOSIS — R278 Other lack of coordination: Secondary | ICD-10-CM | POA: Diagnosis not present

## 2016-11-11 DIAGNOSIS — D509 Iron deficiency anemia, unspecified: Secondary | ICD-10-CM | POA: Diagnosis not present

## 2016-11-15 DIAGNOSIS — M24542 Contracture, left hand: Secondary | ICD-10-CM | POA: Diagnosis not present

## 2016-11-15 DIAGNOSIS — R278 Other lack of coordination: Secondary | ICD-10-CM | POA: Diagnosis not present

## 2016-11-16 DIAGNOSIS — M24542 Contracture, left hand: Secondary | ICD-10-CM | POA: Diagnosis not present

## 2016-11-16 DIAGNOSIS — R278 Other lack of coordination: Secondary | ICD-10-CM | POA: Diagnosis not present

## 2016-11-17 DIAGNOSIS — M24542 Contracture, left hand: Secondary | ICD-10-CM | POA: Diagnosis not present

## 2016-11-17 DIAGNOSIS — R278 Other lack of coordination: Secondary | ICD-10-CM | POA: Diagnosis not present

## 2016-11-18 DIAGNOSIS — M24542 Contracture, left hand: Secondary | ICD-10-CM | POA: Diagnosis not present

## 2016-11-18 DIAGNOSIS — R278 Other lack of coordination: Secondary | ICD-10-CM | POA: Diagnosis not present

## 2016-11-19 DIAGNOSIS — G3183 Dementia with Lewy bodies: Secondary | ICD-10-CM | POA: Diagnosis not present

## 2016-12-15 DIAGNOSIS — R509 Fever, unspecified: Secondary | ICD-10-CM | POA: Diagnosis not present

## 2016-12-16 DIAGNOSIS — N390 Urinary tract infection, site not specified: Secondary | ICD-10-CM | POA: Diagnosis not present

## 2016-12-16 DIAGNOSIS — D509 Iron deficiency anemia, unspecified: Secondary | ICD-10-CM | POA: Diagnosis not present

## 2016-12-17 DIAGNOSIS — N390 Urinary tract infection, site not specified: Secondary | ICD-10-CM | POA: Diagnosis not present

## 2016-12-17 DIAGNOSIS — D509 Iron deficiency anemia, unspecified: Secondary | ICD-10-CM | POA: Diagnosis not present

## 2016-12-20 DIAGNOSIS — R0602 Shortness of breath: Secondary | ICD-10-CM | POA: Diagnosis not present

## 2016-12-21 DIAGNOSIS — J189 Pneumonia, unspecified organism: Secondary | ICD-10-CM | POA: Diagnosis not present

## 2016-12-21 DIAGNOSIS — R0602 Shortness of breath: Secondary | ICD-10-CM | POA: Diagnosis not present

## 2017-01-04 DIAGNOSIS — I6990 Unspecified sequelae of unspecified cerebrovascular disease: Secondary | ICD-10-CM | POA: Diagnosis not present

## 2017-01-04 DIAGNOSIS — R278 Other lack of coordination: Secondary | ICD-10-CM | POA: Diagnosis not present

## 2017-01-04 DIAGNOSIS — R1312 Dysphagia, oropharyngeal phase: Secondary | ICD-10-CM | POA: Diagnosis not present

## 2017-01-06 DIAGNOSIS — R1312 Dysphagia, oropharyngeal phase: Secondary | ICD-10-CM | POA: Diagnosis not present

## 2017-01-06 DIAGNOSIS — R278 Other lack of coordination: Secondary | ICD-10-CM | POA: Diagnosis not present

## 2017-01-06 DIAGNOSIS — I6990 Unspecified sequelae of unspecified cerebrovascular disease: Secondary | ICD-10-CM | POA: Diagnosis not present

## 2017-01-07 ENCOUNTER — Ambulatory Visit: Payer: Medicare Other | Admitting: Nurse Practitioner

## 2017-01-07 DIAGNOSIS — R1312 Dysphagia, oropharyngeal phase: Secondary | ICD-10-CM | POA: Diagnosis not present

## 2017-01-07 DIAGNOSIS — R278 Other lack of coordination: Secondary | ICD-10-CM | POA: Diagnosis not present

## 2017-01-07 DIAGNOSIS — I6990 Unspecified sequelae of unspecified cerebrovascular disease: Secondary | ICD-10-CM | POA: Diagnosis not present

## 2017-01-10 DIAGNOSIS — R278 Other lack of coordination: Secondary | ICD-10-CM | POA: Diagnosis not present

## 2017-01-10 DIAGNOSIS — I6990 Unspecified sequelae of unspecified cerebrovascular disease: Secondary | ICD-10-CM | POA: Diagnosis not present

## 2017-01-10 DIAGNOSIS — R1312 Dysphagia, oropharyngeal phase: Secondary | ICD-10-CM | POA: Diagnosis not present

## 2017-01-11 DIAGNOSIS — R278 Other lack of coordination: Secondary | ICD-10-CM | POA: Diagnosis not present

## 2017-01-11 DIAGNOSIS — I6990 Unspecified sequelae of unspecified cerebrovascular disease: Secondary | ICD-10-CM | POA: Diagnosis not present

## 2017-01-11 DIAGNOSIS — R1312 Dysphagia, oropharyngeal phase: Secondary | ICD-10-CM | POA: Diagnosis not present

## 2017-01-12 DIAGNOSIS — R278 Other lack of coordination: Secondary | ICD-10-CM | POA: Diagnosis not present

## 2017-01-12 DIAGNOSIS — I6990 Unspecified sequelae of unspecified cerebrovascular disease: Secondary | ICD-10-CM | POA: Diagnosis not present

## 2017-01-12 DIAGNOSIS — R1312 Dysphagia, oropharyngeal phase: Secondary | ICD-10-CM | POA: Diagnosis not present

## 2017-01-13 DIAGNOSIS — I4891 Unspecified atrial fibrillation: Secondary | ICD-10-CM | POA: Diagnosis not present

## 2017-01-13 DIAGNOSIS — E785 Hyperlipidemia, unspecified: Secondary | ICD-10-CM | POA: Diagnosis not present

## 2017-01-13 DIAGNOSIS — I10 Essential (primary) hypertension: Secondary | ICD-10-CM | POA: Diagnosis not present

## 2017-01-13 DIAGNOSIS — E46 Unspecified protein-calorie malnutrition: Secondary | ICD-10-CM | POA: Diagnosis not present

## 2017-01-13 DIAGNOSIS — G3183 Dementia with Lewy bodies: Secondary | ICD-10-CM | POA: Diagnosis not present

## 2017-01-13 DIAGNOSIS — I6990 Unspecified sequelae of unspecified cerebrovascular disease: Secondary | ICD-10-CM | POA: Diagnosis not present

## 2017-01-13 DIAGNOSIS — R1312 Dysphagia, oropharyngeal phase: Secondary | ICD-10-CM | POA: Diagnosis not present

## 2017-01-13 DIAGNOSIS — I679 Cerebrovascular disease, unspecified: Secondary | ICD-10-CM | POA: Diagnosis not present

## 2017-01-13 DIAGNOSIS — R1310 Dysphagia, unspecified: Secondary | ICD-10-CM | POA: Diagnosis not present

## 2017-01-13 DIAGNOSIS — J301 Allergic rhinitis due to pollen: Secondary | ICD-10-CM | POA: Diagnosis not present

## 2017-01-13 DIAGNOSIS — G309 Alzheimer's disease, unspecified: Secondary | ICD-10-CM | POA: Diagnosis not present

## 2017-01-13 DIAGNOSIS — R569 Unspecified convulsions: Secondary | ICD-10-CM | POA: Diagnosis not present

## 2017-01-13 DIAGNOSIS — R278 Other lack of coordination: Secondary | ICD-10-CM | POA: Diagnosis not present

## 2017-01-14 DIAGNOSIS — G309 Alzheimer's disease, unspecified: Secondary | ICD-10-CM | POA: Diagnosis not present

## 2017-01-14 DIAGNOSIS — I6990 Unspecified sequelae of unspecified cerebrovascular disease: Secondary | ICD-10-CM | POA: Diagnosis not present

## 2017-01-14 DIAGNOSIS — R1310 Dysphagia, unspecified: Secondary | ICD-10-CM | POA: Diagnosis not present

## 2017-01-14 DIAGNOSIS — E46 Unspecified protein-calorie malnutrition: Secondary | ICD-10-CM | POA: Diagnosis not present

## 2017-01-14 DIAGNOSIS — I4891 Unspecified atrial fibrillation: Secondary | ICD-10-CM | POA: Diagnosis not present

## 2017-01-14 DIAGNOSIS — R1312 Dysphagia, oropharyngeal phase: Secondary | ICD-10-CM | POA: Diagnosis not present

## 2017-01-14 DIAGNOSIS — I679 Cerebrovascular disease, unspecified: Secondary | ICD-10-CM | POA: Diagnosis not present

## 2017-01-14 DIAGNOSIS — R278 Other lack of coordination: Secondary | ICD-10-CM | POA: Diagnosis not present

## 2017-01-14 DIAGNOSIS — R569 Unspecified convulsions: Secondary | ICD-10-CM | POA: Diagnosis not present

## 2017-01-17 DIAGNOSIS — R1310 Dysphagia, unspecified: Secondary | ICD-10-CM | POA: Diagnosis not present

## 2017-01-17 DIAGNOSIS — I679 Cerebrovascular disease, unspecified: Secondary | ICD-10-CM | POA: Diagnosis not present

## 2017-01-17 DIAGNOSIS — I4891 Unspecified atrial fibrillation: Secondary | ICD-10-CM | POA: Diagnosis not present

## 2017-01-17 DIAGNOSIS — E46 Unspecified protein-calorie malnutrition: Secondary | ICD-10-CM | POA: Diagnosis not present

## 2017-01-17 DIAGNOSIS — R569 Unspecified convulsions: Secondary | ICD-10-CM | POA: Diagnosis not present

## 2017-01-17 DIAGNOSIS — G309 Alzheimer's disease, unspecified: Secondary | ICD-10-CM | POA: Diagnosis not present

## 2017-01-18 DIAGNOSIS — G309 Alzheimer's disease, unspecified: Secondary | ICD-10-CM | POA: Diagnosis not present

## 2017-01-18 DIAGNOSIS — R1310 Dysphagia, unspecified: Secondary | ICD-10-CM | POA: Diagnosis not present

## 2017-01-18 DIAGNOSIS — R569 Unspecified convulsions: Secondary | ICD-10-CM | POA: Diagnosis not present

## 2017-01-18 DIAGNOSIS — E46 Unspecified protein-calorie malnutrition: Secondary | ICD-10-CM | POA: Diagnosis not present

## 2017-01-18 DIAGNOSIS — I679 Cerebrovascular disease, unspecified: Secondary | ICD-10-CM | POA: Diagnosis not present

## 2017-01-18 DIAGNOSIS — I4891 Unspecified atrial fibrillation: Secondary | ICD-10-CM | POA: Diagnosis not present

## 2017-01-20 DIAGNOSIS — R569 Unspecified convulsions: Secondary | ICD-10-CM | POA: Diagnosis not present

## 2017-01-20 DIAGNOSIS — I4891 Unspecified atrial fibrillation: Secondary | ICD-10-CM | POA: Diagnosis not present

## 2017-01-20 DIAGNOSIS — R1310 Dysphagia, unspecified: Secondary | ICD-10-CM | POA: Diagnosis not present

## 2017-01-20 DIAGNOSIS — E46 Unspecified protein-calorie malnutrition: Secondary | ICD-10-CM | POA: Diagnosis not present

## 2017-01-20 DIAGNOSIS — I679 Cerebrovascular disease, unspecified: Secondary | ICD-10-CM | POA: Diagnosis not present

## 2017-01-20 DIAGNOSIS — G309 Alzheimer's disease, unspecified: Secondary | ICD-10-CM | POA: Diagnosis not present

## 2017-01-25 DIAGNOSIS — R1310 Dysphagia, unspecified: Secondary | ICD-10-CM | POA: Diagnosis not present

## 2017-01-25 DIAGNOSIS — E46 Unspecified protein-calorie malnutrition: Secondary | ICD-10-CM | POA: Diagnosis not present

## 2017-01-25 DIAGNOSIS — I4891 Unspecified atrial fibrillation: Secondary | ICD-10-CM | POA: Diagnosis not present

## 2017-01-25 DIAGNOSIS — G309 Alzheimer's disease, unspecified: Secondary | ICD-10-CM | POA: Diagnosis not present

## 2017-01-25 DIAGNOSIS — R569 Unspecified convulsions: Secondary | ICD-10-CM | POA: Diagnosis not present

## 2017-01-25 DIAGNOSIS — I679 Cerebrovascular disease, unspecified: Secondary | ICD-10-CM | POA: Diagnosis not present

## 2017-01-27 DIAGNOSIS — R569 Unspecified convulsions: Secondary | ICD-10-CM | POA: Diagnosis not present

## 2017-01-27 DIAGNOSIS — I679 Cerebrovascular disease, unspecified: Secondary | ICD-10-CM | POA: Diagnosis not present

## 2017-01-27 DIAGNOSIS — E46 Unspecified protein-calorie malnutrition: Secondary | ICD-10-CM | POA: Diagnosis not present

## 2017-01-27 DIAGNOSIS — G309 Alzheimer's disease, unspecified: Secondary | ICD-10-CM | POA: Diagnosis not present

## 2017-01-27 DIAGNOSIS — I4891 Unspecified atrial fibrillation: Secondary | ICD-10-CM | POA: Diagnosis not present

## 2017-01-27 DIAGNOSIS — E785 Hyperlipidemia, unspecified: Secondary | ICD-10-CM | POA: Diagnosis not present

## 2017-01-27 DIAGNOSIS — J301 Allergic rhinitis due to pollen: Secondary | ICD-10-CM | POA: Diagnosis not present

## 2017-01-27 DIAGNOSIS — I10 Essential (primary) hypertension: Secondary | ICD-10-CM | POA: Diagnosis not present

## 2017-01-27 DIAGNOSIS — R1310 Dysphagia, unspecified: Secondary | ICD-10-CM | POA: Diagnosis not present

## 2017-02-01 DIAGNOSIS — R569 Unspecified convulsions: Secondary | ICD-10-CM | POA: Diagnosis not present

## 2017-02-01 DIAGNOSIS — I679 Cerebrovascular disease, unspecified: Secondary | ICD-10-CM | POA: Diagnosis not present

## 2017-02-01 DIAGNOSIS — E46 Unspecified protein-calorie malnutrition: Secondary | ICD-10-CM | POA: Diagnosis not present

## 2017-02-01 DIAGNOSIS — G309 Alzheimer's disease, unspecified: Secondary | ICD-10-CM | POA: Diagnosis not present

## 2017-02-01 DIAGNOSIS — R1310 Dysphagia, unspecified: Secondary | ICD-10-CM | POA: Diagnosis not present

## 2017-02-01 DIAGNOSIS — I4891 Unspecified atrial fibrillation: Secondary | ICD-10-CM | POA: Diagnosis not present

## 2017-02-03 DIAGNOSIS — B351 Tinea unguium: Secondary | ICD-10-CM | POA: Diagnosis not present

## 2017-02-03 DIAGNOSIS — I4891 Unspecified atrial fibrillation: Secondary | ICD-10-CM | POA: Diagnosis not present

## 2017-02-03 DIAGNOSIS — G309 Alzheimer's disease, unspecified: Secondary | ICD-10-CM | POA: Diagnosis not present

## 2017-02-03 DIAGNOSIS — L603 Nail dystrophy: Secondary | ICD-10-CM | POA: Diagnosis not present

## 2017-02-03 DIAGNOSIS — I679 Cerebrovascular disease, unspecified: Secondary | ICD-10-CM | POA: Diagnosis not present

## 2017-02-03 DIAGNOSIS — R569 Unspecified convulsions: Secondary | ICD-10-CM | POA: Diagnosis not present

## 2017-02-03 DIAGNOSIS — R1310 Dysphagia, unspecified: Secondary | ICD-10-CM | POA: Diagnosis not present

## 2017-02-03 DIAGNOSIS — I739 Peripheral vascular disease, unspecified: Secondary | ICD-10-CM | POA: Diagnosis not present

## 2017-02-03 DIAGNOSIS — E46 Unspecified protein-calorie malnutrition: Secondary | ICD-10-CM | POA: Diagnosis not present

## 2017-02-07 DIAGNOSIS — I679 Cerebrovascular disease, unspecified: Secondary | ICD-10-CM | POA: Diagnosis not present

## 2017-02-07 DIAGNOSIS — G309 Alzheimer's disease, unspecified: Secondary | ICD-10-CM | POA: Diagnosis not present

## 2017-02-07 DIAGNOSIS — R569 Unspecified convulsions: Secondary | ICD-10-CM | POA: Diagnosis not present

## 2017-02-07 DIAGNOSIS — E46 Unspecified protein-calorie malnutrition: Secondary | ICD-10-CM | POA: Diagnosis not present

## 2017-02-07 DIAGNOSIS — I4891 Unspecified atrial fibrillation: Secondary | ICD-10-CM | POA: Diagnosis not present

## 2017-02-07 DIAGNOSIS — R1310 Dysphagia, unspecified: Secondary | ICD-10-CM | POA: Diagnosis not present

## 2017-02-08 DIAGNOSIS — R1310 Dysphagia, unspecified: Secondary | ICD-10-CM | POA: Diagnosis not present

## 2017-02-08 DIAGNOSIS — E46 Unspecified protein-calorie malnutrition: Secondary | ICD-10-CM | POA: Diagnosis not present

## 2017-02-08 DIAGNOSIS — R569 Unspecified convulsions: Secondary | ICD-10-CM | POA: Diagnosis not present

## 2017-02-08 DIAGNOSIS — G309 Alzheimer's disease, unspecified: Secondary | ICD-10-CM | POA: Diagnosis not present

## 2017-02-08 DIAGNOSIS — I679 Cerebrovascular disease, unspecified: Secondary | ICD-10-CM | POA: Diagnosis not present

## 2017-02-08 DIAGNOSIS — I4891 Unspecified atrial fibrillation: Secondary | ICD-10-CM | POA: Diagnosis not present

## 2017-02-10 DIAGNOSIS — R569 Unspecified convulsions: Secondary | ICD-10-CM | POA: Diagnosis not present

## 2017-02-10 DIAGNOSIS — R1310 Dysphagia, unspecified: Secondary | ICD-10-CM | POA: Diagnosis not present

## 2017-02-10 DIAGNOSIS — I679 Cerebrovascular disease, unspecified: Secondary | ICD-10-CM | POA: Diagnosis not present

## 2017-02-10 DIAGNOSIS — E46 Unspecified protein-calorie malnutrition: Secondary | ICD-10-CM | POA: Diagnosis not present

## 2017-02-10 DIAGNOSIS — R4701 Aphasia: Secondary | ICD-10-CM | POA: Diagnosis not present

## 2017-02-10 DIAGNOSIS — F0280 Dementia in other diseases classified elsewhere without behavioral disturbance: Secondary | ICD-10-CM | POA: Diagnosis not present

## 2017-02-10 DIAGNOSIS — I4891 Unspecified atrial fibrillation: Secondary | ICD-10-CM | POA: Diagnosis not present

## 2017-02-10 DIAGNOSIS — G309 Alzheimer's disease, unspecified: Secondary | ICD-10-CM | POA: Diagnosis not present

## 2017-02-15 DIAGNOSIS — G309 Alzheimer's disease, unspecified: Secondary | ICD-10-CM | POA: Diagnosis not present

## 2017-02-15 DIAGNOSIS — I4891 Unspecified atrial fibrillation: Secondary | ICD-10-CM | POA: Diagnosis not present

## 2017-02-15 DIAGNOSIS — E46 Unspecified protein-calorie malnutrition: Secondary | ICD-10-CM | POA: Diagnosis not present

## 2017-02-15 DIAGNOSIS — R569 Unspecified convulsions: Secondary | ICD-10-CM | POA: Diagnosis not present

## 2017-02-15 DIAGNOSIS — R1310 Dysphagia, unspecified: Secondary | ICD-10-CM | POA: Diagnosis not present

## 2017-02-15 DIAGNOSIS — I679 Cerebrovascular disease, unspecified: Secondary | ICD-10-CM | POA: Diagnosis not present

## 2017-02-18 DIAGNOSIS — G309 Alzheimer's disease, unspecified: Secondary | ICD-10-CM | POA: Diagnosis not present

## 2017-02-18 DIAGNOSIS — R1310 Dysphagia, unspecified: Secondary | ICD-10-CM | POA: Diagnosis not present

## 2017-02-18 DIAGNOSIS — E46 Unspecified protein-calorie malnutrition: Secondary | ICD-10-CM | POA: Diagnosis not present

## 2017-02-18 DIAGNOSIS — I4891 Unspecified atrial fibrillation: Secondary | ICD-10-CM | POA: Diagnosis not present

## 2017-02-18 DIAGNOSIS — I679 Cerebrovascular disease, unspecified: Secondary | ICD-10-CM | POA: Diagnosis not present

## 2017-02-18 DIAGNOSIS — R569 Unspecified convulsions: Secondary | ICD-10-CM | POA: Diagnosis not present

## 2017-02-21 DIAGNOSIS — I4891 Unspecified atrial fibrillation: Secondary | ICD-10-CM | POA: Diagnosis not present

## 2017-02-21 DIAGNOSIS — G309 Alzheimer's disease, unspecified: Secondary | ICD-10-CM | POA: Diagnosis not present

## 2017-02-21 DIAGNOSIS — E46 Unspecified protein-calorie malnutrition: Secondary | ICD-10-CM | POA: Diagnosis not present

## 2017-02-21 DIAGNOSIS — I679 Cerebrovascular disease, unspecified: Secondary | ICD-10-CM | POA: Diagnosis not present

## 2017-02-21 DIAGNOSIS — R1310 Dysphagia, unspecified: Secondary | ICD-10-CM | POA: Diagnosis not present

## 2017-02-21 DIAGNOSIS — R569 Unspecified convulsions: Secondary | ICD-10-CM | POA: Diagnosis not present

## 2017-02-22 DIAGNOSIS — R1310 Dysphagia, unspecified: Secondary | ICD-10-CM | POA: Diagnosis not present

## 2017-02-22 DIAGNOSIS — E46 Unspecified protein-calorie malnutrition: Secondary | ICD-10-CM | POA: Diagnosis not present

## 2017-02-22 DIAGNOSIS — R569 Unspecified convulsions: Secondary | ICD-10-CM | POA: Diagnosis not present

## 2017-02-22 DIAGNOSIS — G309 Alzheimer's disease, unspecified: Secondary | ICD-10-CM | POA: Diagnosis not present

## 2017-02-22 DIAGNOSIS — I679 Cerebrovascular disease, unspecified: Secondary | ICD-10-CM | POA: Diagnosis not present

## 2017-02-22 DIAGNOSIS — I4891 Unspecified atrial fibrillation: Secondary | ICD-10-CM | POA: Diagnosis not present

## 2017-02-24 DIAGNOSIS — R1310 Dysphagia, unspecified: Secondary | ICD-10-CM | POA: Diagnosis not present

## 2017-02-24 DIAGNOSIS — I679 Cerebrovascular disease, unspecified: Secondary | ICD-10-CM | POA: Diagnosis not present

## 2017-02-24 DIAGNOSIS — R569 Unspecified convulsions: Secondary | ICD-10-CM | POA: Diagnosis not present

## 2017-02-24 DIAGNOSIS — G309 Alzheimer's disease, unspecified: Secondary | ICD-10-CM | POA: Diagnosis not present

## 2017-02-24 DIAGNOSIS — I4891 Unspecified atrial fibrillation: Secondary | ICD-10-CM | POA: Diagnosis not present

## 2017-02-24 DIAGNOSIS — E46 Unspecified protein-calorie malnutrition: Secondary | ICD-10-CM | POA: Diagnosis not present

## 2017-02-25 DIAGNOSIS — I679 Cerebrovascular disease, unspecified: Secondary | ICD-10-CM | POA: Diagnosis not present

## 2017-02-25 DIAGNOSIS — R569 Unspecified convulsions: Secondary | ICD-10-CM | POA: Diagnosis not present

## 2017-02-25 DIAGNOSIS — E46 Unspecified protein-calorie malnutrition: Secondary | ICD-10-CM | POA: Diagnosis not present

## 2017-02-25 DIAGNOSIS — I4891 Unspecified atrial fibrillation: Secondary | ICD-10-CM | POA: Diagnosis not present

## 2017-02-25 DIAGNOSIS — G309 Alzheimer's disease, unspecified: Secondary | ICD-10-CM | POA: Diagnosis not present

## 2017-02-25 DIAGNOSIS — R1310 Dysphagia, unspecified: Secondary | ICD-10-CM | POA: Diagnosis not present

## 2017-02-26 DIAGNOSIS — G309 Alzheimer's disease, unspecified: Secondary | ICD-10-CM | POA: Diagnosis not present

## 2017-02-26 DIAGNOSIS — E46 Unspecified protein-calorie malnutrition: Secondary | ICD-10-CM | POA: Diagnosis not present

## 2017-02-26 DIAGNOSIS — J301 Allergic rhinitis due to pollen: Secondary | ICD-10-CM | POA: Diagnosis not present

## 2017-02-26 DIAGNOSIS — E785 Hyperlipidemia, unspecified: Secondary | ICD-10-CM | POA: Diagnosis not present

## 2017-02-26 DIAGNOSIS — I4891 Unspecified atrial fibrillation: Secondary | ICD-10-CM | POA: Diagnosis not present

## 2017-02-26 DIAGNOSIS — R569 Unspecified convulsions: Secondary | ICD-10-CM | POA: Diagnosis not present

## 2017-02-26 DIAGNOSIS — I10 Essential (primary) hypertension: Secondary | ICD-10-CM | POA: Diagnosis not present

## 2017-02-26 DIAGNOSIS — R1310 Dysphagia, unspecified: Secondary | ICD-10-CM | POA: Diagnosis not present

## 2017-02-26 DIAGNOSIS — I679 Cerebrovascular disease, unspecified: Secondary | ICD-10-CM | POA: Diagnosis not present

## 2017-03-01 DIAGNOSIS — I4891 Unspecified atrial fibrillation: Secondary | ICD-10-CM | POA: Diagnosis not present

## 2017-03-01 DIAGNOSIS — R1310 Dysphagia, unspecified: Secondary | ICD-10-CM | POA: Diagnosis not present

## 2017-03-01 DIAGNOSIS — E46 Unspecified protein-calorie malnutrition: Secondary | ICD-10-CM | POA: Diagnosis not present

## 2017-03-01 DIAGNOSIS — G309 Alzheimer's disease, unspecified: Secondary | ICD-10-CM | POA: Diagnosis not present

## 2017-03-01 DIAGNOSIS — I679 Cerebrovascular disease, unspecified: Secondary | ICD-10-CM | POA: Diagnosis not present

## 2017-03-01 DIAGNOSIS — R569 Unspecified convulsions: Secondary | ICD-10-CM | POA: Diagnosis not present

## 2017-03-03 DIAGNOSIS — G309 Alzheimer's disease, unspecified: Secondary | ICD-10-CM | POA: Diagnosis not present

## 2017-03-03 DIAGNOSIS — R1310 Dysphagia, unspecified: Secondary | ICD-10-CM | POA: Diagnosis not present

## 2017-03-03 DIAGNOSIS — I4891 Unspecified atrial fibrillation: Secondary | ICD-10-CM | POA: Diagnosis not present

## 2017-03-03 DIAGNOSIS — I679 Cerebrovascular disease, unspecified: Secondary | ICD-10-CM | POA: Diagnosis not present

## 2017-03-03 DIAGNOSIS — E46 Unspecified protein-calorie malnutrition: Secondary | ICD-10-CM | POA: Diagnosis not present

## 2017-03-03 DIAGNOSIS — R569 Unspecified convulsions: Secondary | ICD-10-CM | POA: Diagnosis not present

## 2017-03-04 DIAGNOSIS — I4891 Unspecified atrial fibrillation: Secondary | ICD-10-CM | POA: Diagnosis not present

## 2017-03-04 DIAGNOSIS — E46 Unspecified protein-calorie malnutrition: Secondary | ICD-10-CM | POA: Diagnosis not present

## 2017-03-04 DIAGNOSIS — R569 Unspecified convulsions: Secondary | ICD-10-CM | POA: Diagnosis not present

## 2017-03-04 DIAGNOSIS — I679 Cerebrovascular disease, unspecified: Secondary | ICD-10-CM | POA: Diagnosis not present

## 2017-03-04 DIAGNOSIS — R1310 Dysphagia, unspecified: Secondary | ICD-10-CM | POA: Diagnosis not present

## 2017-03-04 DIAGNOSIS — G309 Alzheimer's disease, unspecified: Secondary | ICD-10-CM | POA: Diagnosis not present

## 2017-03-10 DIAGNOSIS — I679 Cerebrovascular disease, unspecified: Secondary | ICD-10-CM | POA: Diagnosis not present

## 2017-03-10 DIAGNOSIS — E46 Unspecified protein-calorie malnutrition: Secondary | ICD-10-CM | POA: Diagnosis not present

## 2017-03-10 DIAGNOSIS — R1310 Dysphagia, unspecified: Secondary | ICD-10-CM | POA: Diagnosis not present

## 2017-03-10 DIAGNOSIS — R569 Unspecified convulsions: Secondary | ICD-10-CM | POA: Diagnosis not present

## 2017-03-10 DIAGNOSIS — G309 Alzheimer's disease, unspecified: Secondary | ICD-10-CM | POA: Diagnosis not present

## 2017-03-10 DIAGNOSIS — I4891 Unspecified atrial fibrillation: Secondary | ICD-10-CM | POA: Diagnosis not present

## 2017-03-13 DIAGNOSIS — G309 Alzheimer's disease, unspecified: Secondary | ICD-10-CM | POA: Diagnosis not present

## 2017-03-13 DIAGNOSIS — R1310 Dysphagia, unspecified: Secondary | ICD-10-CM | POA: Diagnosis not present

## 2017-03-13 DIAGNOSIS — I4891 Unspecified atrial fibrillation: Secondary | ICD-10-CM | POA: Diagnosis not present

## 2017-03-13 DIAGNOSIS — R569 Unspecified convulsions: Secondary | ICD-10-CM | POA: Diagnosis not present

## 2017-03-13 DIAGNOSIS — E46 Unspecified protein-calorie malnutrition: Secondary | ICD-10-CM | POA: Diagnosis not present

## 2017-03-13 DIAGNOSIS — I679 Cerebrovascular disease, unspecified: Secondary | ICD-10-CM | POA: Diagnosis not present

## 2017-03-15 DIAGNOSIS — I679 Cerebrovascular disease, unspecified: Secondary | ICD-10-CM | POA: Diagnosis not present

## 2017-03-15 DIAGNOSIS — R1310 Dysphagia, unspecified: Secondary | ICD-10-CM | POA: Diagnosis not present

## 2017-03-15 DIAGNOSIS — E46 Unspecified protein-calorie malnutrition: Secondary | ICD-10-CM | POA: Diagnosis not present

## 2017-03-15 DIAGNOSIS — I4891 Unspecified atrial fibrillation: Secondary | ICD-10-CM | POA: Diagnosis not present

## 2017-03-15 DIAGNOSIS — G309 Alzheimer's disease, unspecified: Secondary | ICD-10-CM | POA: Diagnosis not present

## 2017-03-15 DIAGNOSIS — R569 Unspecified convulsions: Secondary | ICD-10-CM | POA: Diagnosis not present

## 2017-03-17 DIAGNOSIS — I4891 Unspecified atrial fibrillation: Secondary | ICD-10-CM | POA: Diagnosis not present

## 2017-03-17 DIAGNOSIS — I679 Cerebrovascular disease, unspecified: Secondary | ICD-10-CM | POA: Diagnosis not present

## 2017-03-17 DIAGNOSIS — R1310 Dysphagia, unspecified: Secondary | ICD-10-CM | POA: Diagnosis not present

## 2017-03-17 DIAGNOSIS — R569 Unspecified convulsions: Secondary | ICD-10-CM | POA: Diagnosis not present

## 2017-03-17 DIAGNOSIS — G309 Alzheimer's disease, unspecified: Secondary | ICD-10-CM | POA: Diagnosis not present

## 2017-03-17 DIAGNOSIS — E46 Unspecified protein-calorie malnutrition: Secondary | ICD-10-CM | POA: Diagnosis not present

## 2017-03-18 DIAGNOSIS — R569 Unspecified convulsions: Secondary | ICD-10-CM | POA: Diagnosis not present

## 2017-03-18 DIAGNOSIS — G309 Alzheimer's disease, unspecified: Secondary | ICD-10-CM | POA: Diagnosis not present

## 2017-03-18 DIAGNOSIS — E46 Unspecified protein-calorie malnutrition: Secondary | ICD-10-CM | POA: Diagnosis not present

## 2017-03-18 DIAGNOSIS — I4891 Unspecified atrial fibrillation: Secondary | ICD-10-CM | POA: Diagnosis not present

## 2017-03-18 DIAGNOSIS — R1310 Dysphagia, unspecified: Secondary | ICD-10-CM | POA: Diagnosis not present

## 2017-03-18 DIAGNOSIS — I679 Cerebrovascular disease, unspecified: Secondary | ICD-10-CM | POA: Diagnosis not present

## 2017-03-24 DIAGNOSIS — R569 Unspecified convulsions: Secondary | ICD-10-CM | POA: Diagnosis not present

## 2017-03-24 DIAGNOSIS — I4891 Unspecified atrial fibrillation: Secondary | ICD-10-CM | POA: Diagnosis not present

## 2017-03-24 DIAGNOSIS — G309 Alzheimer's disease, unspecified: Secondary | ICD-10-CM | POA: Diagnosis not present

## 2017-03-24 DIAGNOSIS — E46 Unspecified protein-calorie malnutrition: Secondary | ICD-10-CM | POA: Diagnosis not present

## 2017-03-24 DIAGNOSIS — I679 Cerebrovascular disease, unspecified: Secondary | ICD-10-CM | POA: Diagnosis not present

## 2017-03-24 DIAGNOSIS — R1310 Dysphagia, unspecified: Secondary | ICD-10-CM | POA: Diagnosis not present

## 2017-03-24 IMAGING — CT CT HEAD W/O CM
3 of 4 series · 16 of 47 positions shown, 19 images · non-contrast
Comparison: CT dated 06/23/2015

CLINICAL DATA: 89-year-old female with acute onset left-sided
weakness and slurred speech. Code stroke.

EXAM:
CT HEAD WITHOUT CONTRAST
TECHNIQUE: Contiguous axial images were obtained from the base of the skull
through the vertex without intravenous contrast.

[Series 201: head w/o, idose (1) · axial · non-contrast · 0.49mm/px · z∈[+67,+197]mm · 10 of 32 slices shown, 13 images]
[im 3/32  brain]
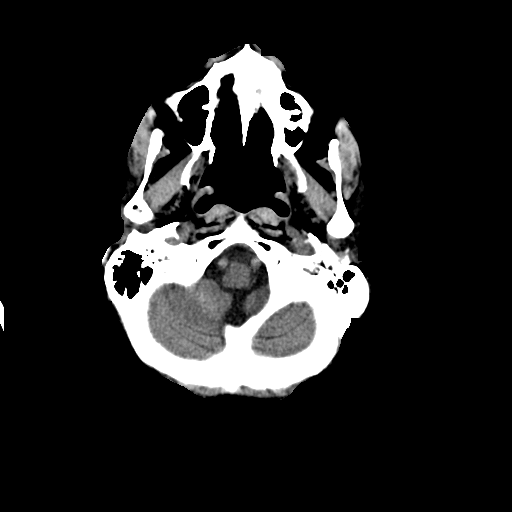
[im 3/32  bone]
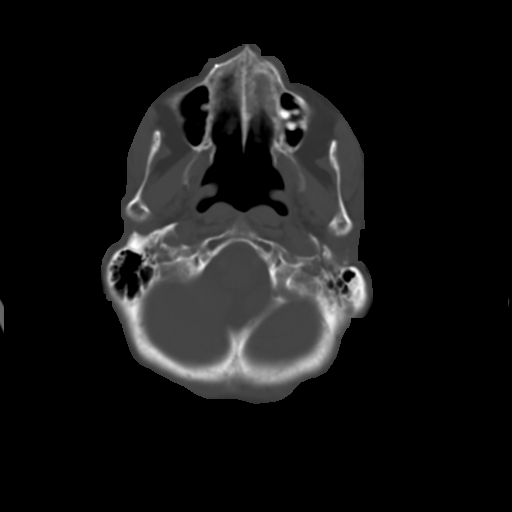
[im 5/32  brain]
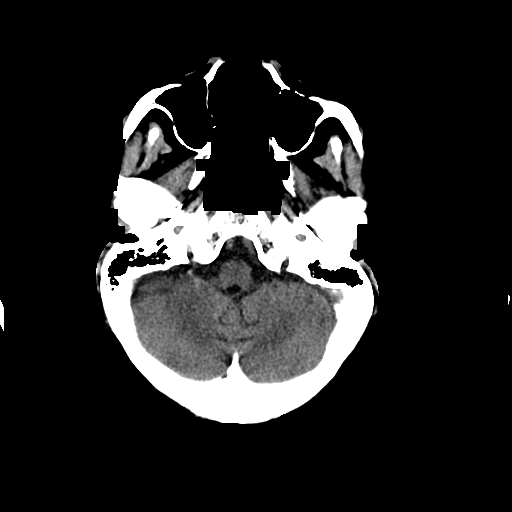
[im 9/32  brain]
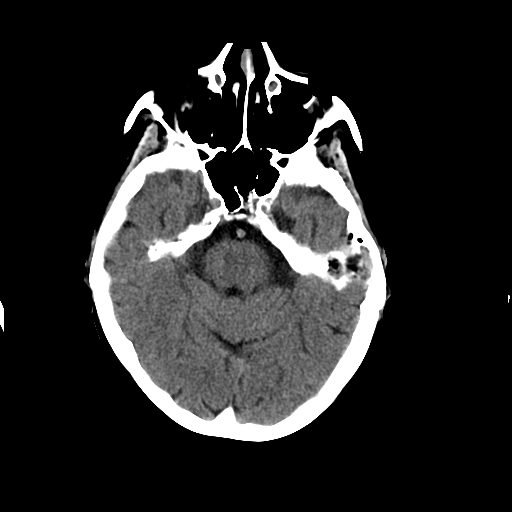
[im 12/32  brain]
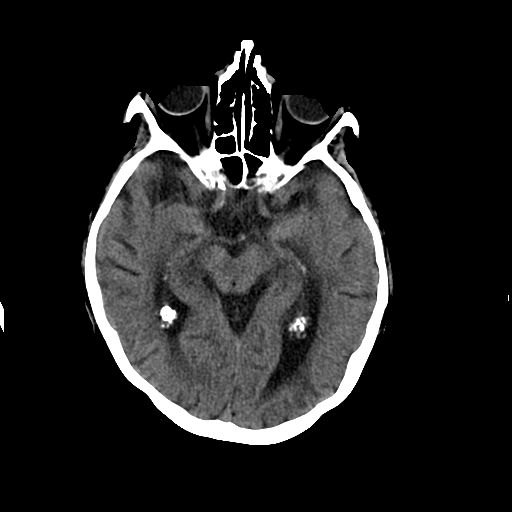
[im 14/32  brain]
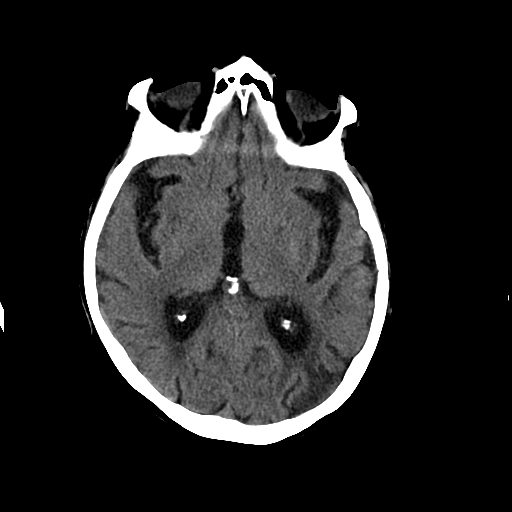
[im 14/32  bone]
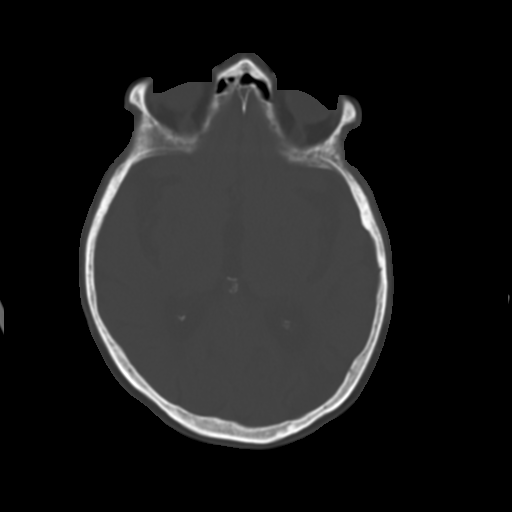
[im 18/32  brain]
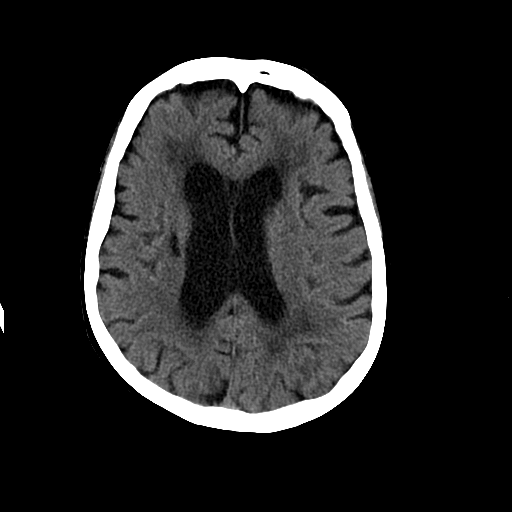
[im 20/32  brain]
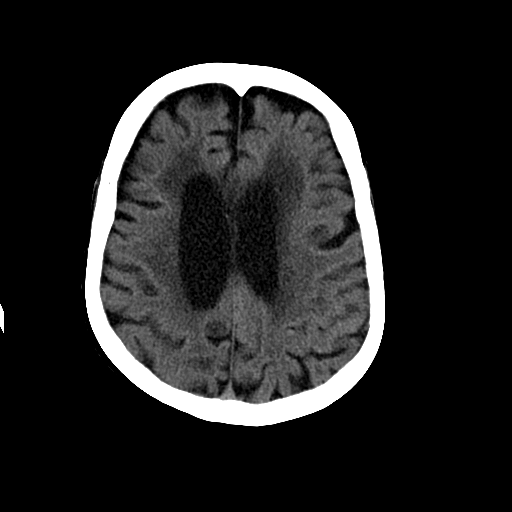
[im 23/32  brain]
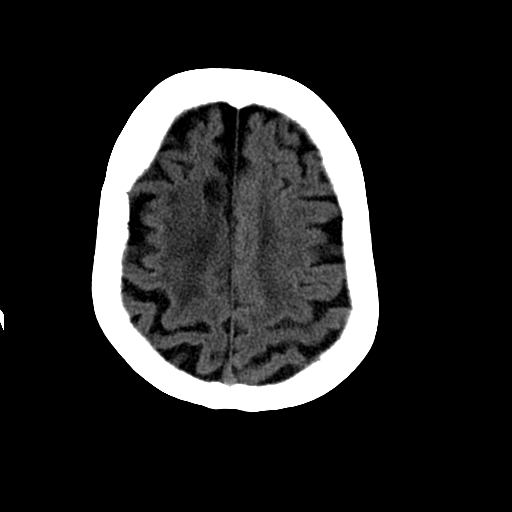
[im 27/32  brain]
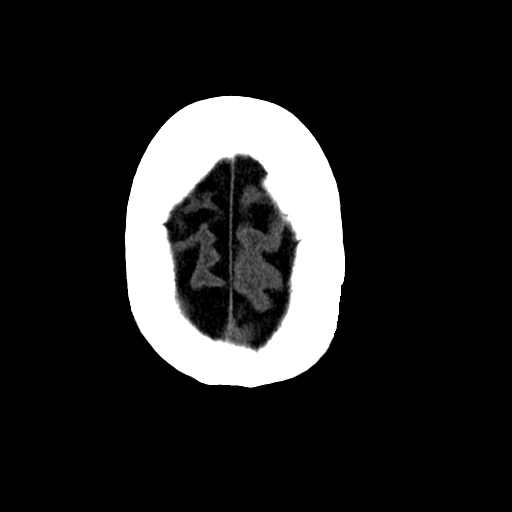
[im 27/32  bone]
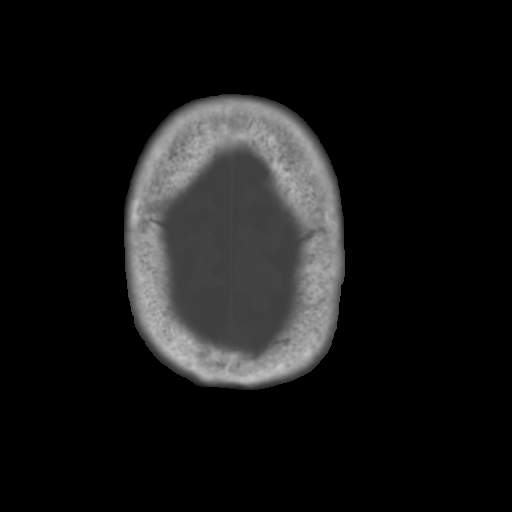
[im 29/32  brain]
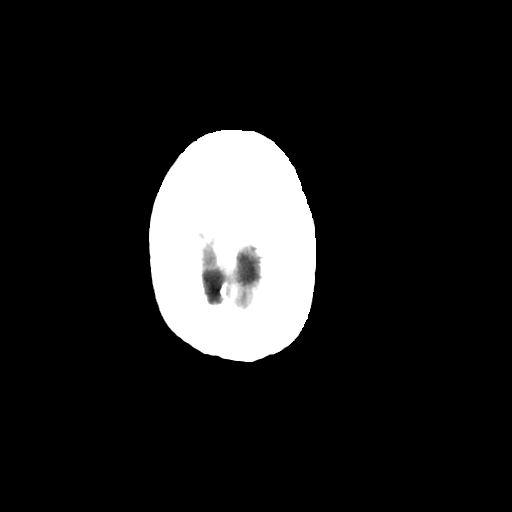

[Series 203: coronal st, idose (1) · coronal · 0.40mm/px · 3 of 82 slices shown]
[im 28/82  brain]
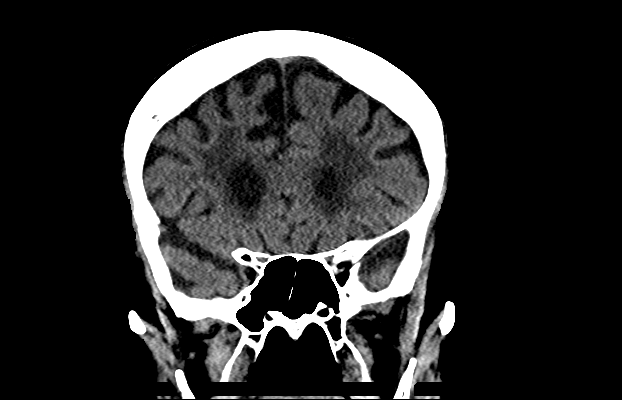
[im 37/82  brain]
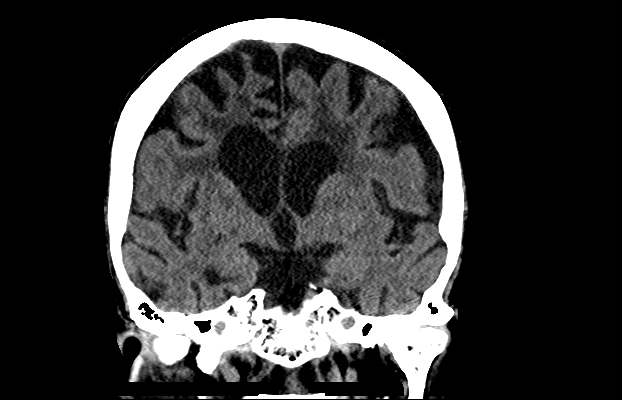
[im 46/82  brain]
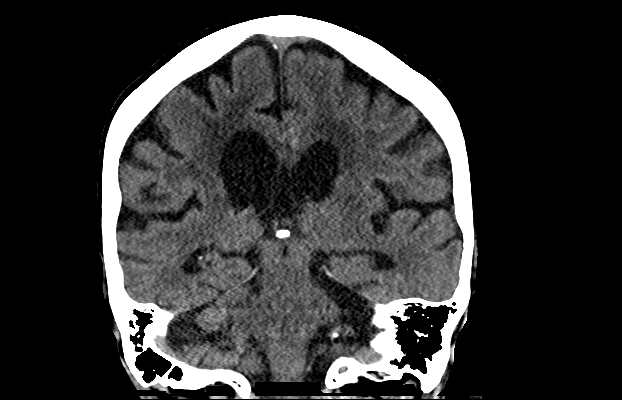

[Series 204: sagittal st, idose (1) · sagittal · 0.40mm/px · 3 of 82 slices shown]
[im 28/82  brain]
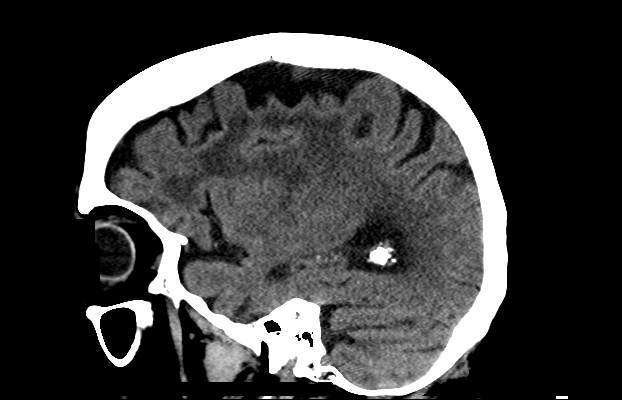
[im 41/82  brain]
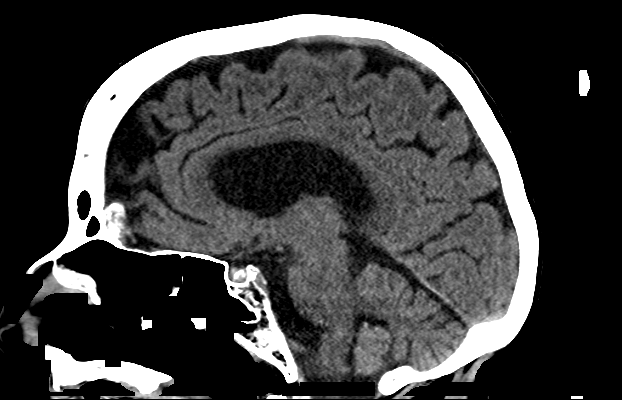
[im 55/82  brain]
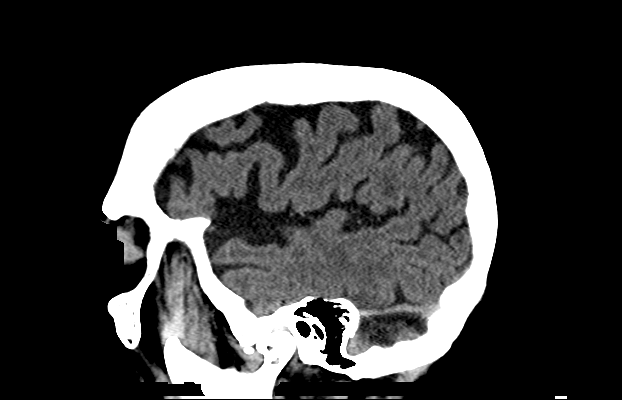

[16 of 47 positions shown; findings below may reference images not displayed]

FINDINGS: The ventricles are dilated and the sulci are prominent compatible
with age-related atrophy. Periventricular and deep white matter
hypodensities represent chronic microvascular ischemic changes.
There is a focal area old infarct and encephalomalacia in the left
occipital lobe. There is no intracranial hemorrhage. No mass effect
or midline shift identified.

The visualized paranasal sinuses and mastoid air cells are well
aerated. The calvarium is intact.
IMPRESSION: No acute intracranial hemorrhage.

Age-related atrophy and chronic microvascular ischemic disease. Left
occipital old infarct and encephalomalacia.

If symptoms persist and there are no contraindications, MRI may
provide better evaluation if clinically indicated.

These results were called by telephone at the time of interpretation
on 08/07/2015 at [DATE] to Dr. Honeycutt, who verbally acknowledged
these results.

## 2017-03-28 IMAGING — CR DG ABD PORTABLE 1V
1 series · 1 of 1 positions shown · non-contrast
Comparison: CT abdomen and pelvis 12/18/2014 and earlier. Abdomen
x-ray 12/18/2014.

CLINICAL DATA: Possible constipation as the patient last had a
bowel movement 3 days ago. Nausea and vomiting yesterday.

EXAM:
PORTABLE ABDOMEN - 1 VIEW

[AP]
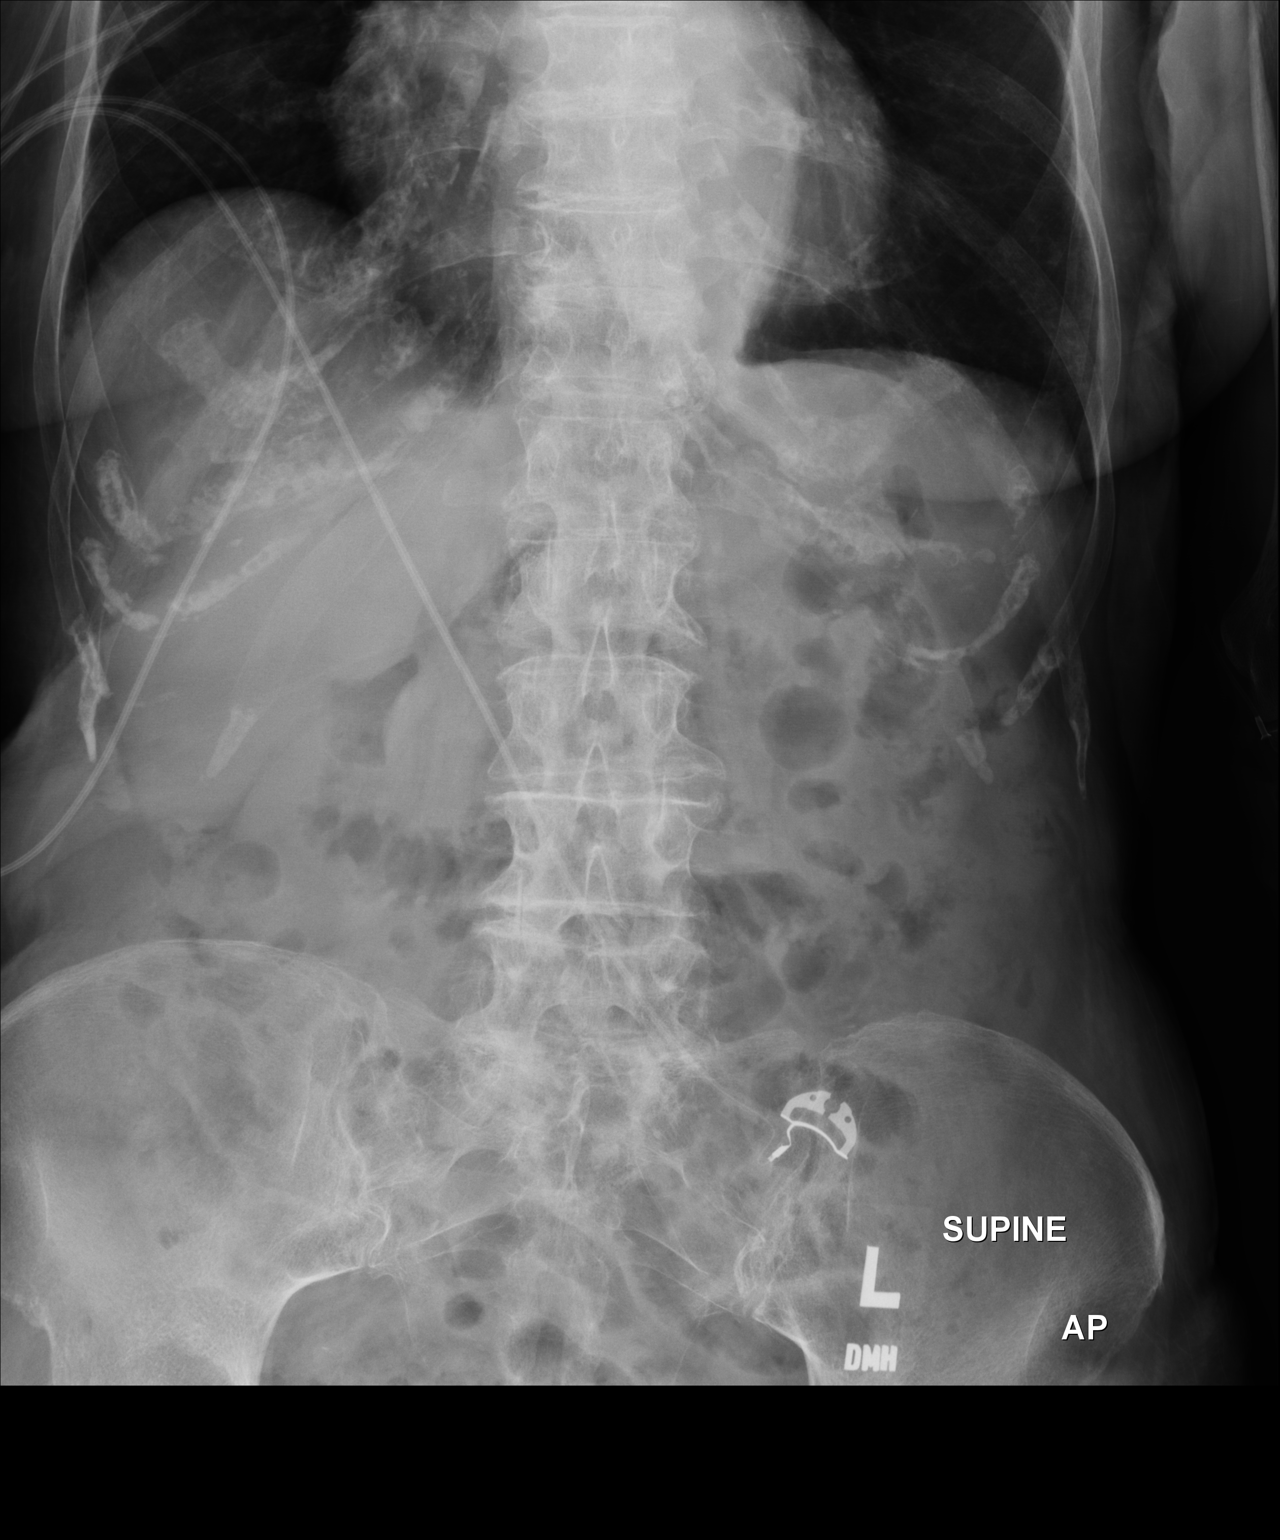

[1 of 1 positions shown; findings below may reference images not displayed]

FINDINGS: Bowel gas pattern unremarkable without evidence of obstruction or
significant ileus. Expected stool burden in the colon. No visible
opaque urinary tract calculi. Degenerative changes involving the
lower thoracic and lumbar spine.
IMPRESSION: No acute abdominal abnormality.  Expected colonic stool burden.

## 2017-03-29 DIAGNOSIS — E46 Unspecified protein-calorie malnutrition: Secondary | ICD-10-CM | POA: Diagnosis not present

## 2017-03-29 DIAGNOSIS — G309 Alzheimer's disease, unspecified: Secondary | ICD-10-CM | POA: Diagnosis not present

## 2017-03-29 DIAGNOSIS — I4891 Unspecified atrial fibrillation: Secondary | ICD-10-CM | POA: Diagnosis not present

## 2017-03-29 DIAGNOSIS — R1310 Dysphagia, unspecified: Secondary | ICD-10-CM | POA: Diagnosis not present

## 2017-03-29 DIAGNOSIS — E785 Hyperlipidemia, unspecified: Secondary | ICD-10-CM | POA: Diagnosis not present

## 2017-03-29 DIAGNOSIS — R569 Unspecified convulsions: Secondary | ICD-10-CM | POA: Diagnosis not present

## 2017-03-29 DIAGNOSIS — I10 Essential (primary) hypertension: Secondary | ICD-10-CM | POA: Diagnosis not present

## 2017-03-29 DIAGNOSIS — J301 Allergic rhinitis due to pollen: Secondary | ICD-10-CM | POA: Diagnosis not present

## 2017-03-29 DIAGNOSIS — I679 Cerebrovascular disease, unspecified: Secondary | ICD-10-CM | POA: Diagnosis not present

## 2017-03-31 DIAGNOSIS — E46 Unspecified protein-calorie malnutrition: Secondary | ICD-10-CM | POA: Diagnosis not present

## 2017-03-31 DIAGNOSIS — R569 Unspecified convulsions: Secondary | ICD-10-CM | POA: Diagnosis not present

## 2017-03-31 DIAGNOSIS — R1310 Dysphagia, unspecified: Secondary | ICD-10-CM | POA: Diagnosis not present

## 2017-03-31 DIAGNOSIS — I4891 Unspecified atrial fibrillation: Secondary | ICD-10-CM | POA: Diagnosis not present

## 2017-03-31 DIAGNOSIS — G309 Alzheimer's disease, unspecified: Secondary | ICD-10-CM | POA: Diagnosis not present

## 2017-03-31 DIAGNOSIS — I679 Cerebrovascular disease, unspecified: Secondary | ICD-10-CM | POA: Diagnosis not present

## 2017-04-01 DIAGNOSIS — I679 Cerebrovascular disease, unspecified: Secondary | ICD-10-CM | POA: Diagnosis not present

## 2017-04-01 DIAGNOSIS — E46 Unspecified protein-calorie malnutrition: Secondary | ICD-10-CM | POA: Diagnosis not present

## 2017-04-01 DIAGNOSIS — I4891 Unspecified atrial fibrillation: Secondary | ICD-10-CM | POA: Diagnosis not present

## 2017-04-01 DIAGNOSIS — R569 Unspecified convulsions: Secondary | ICD-10-CM | POA: Diagnosis not present

## 2017-04-01 DIAGNOSIS — G309 Alzheimer's disease, unspecified: Secondary | ICD-10-CM | POA: Diagnosis not present

## 2017-04-01 DIAGNOSIS — R1310 Dysphagia, unspecified: Secondary | ICD-10-CM | POA: Diagnosis not present

## 2017-04-05 DIAGNOSIS — G309 Alzheimer's disease, unspecified: Secondary | ICD-10-CM | POA: Diagnosis not present

## 2017-04-05 DIAGNOSIS — I4891 Unspecified atrial fibrillation: Secondary | ICD-10-CM | POA: Diagnosis not present

## 2017-04-05 DIAGNOSIS — R569 Unspecified convulsions: Secondary | ICD-10-CM | POA: Diagnosis not present

## 2017-04-05 DIAGNOSIS — I679 Cerebrovascular disease, unspecified: Secondary | ICD-10-CM | POA: Diagnosis not present

## 2017-04-05 DIAGNOSIS — R1310 Dysphagia, unspecified: Secondary | ICD-10-CM | POA: Diagnosis not present

## 2017-04-05 DIAGNOSIS — E46 Unspecified protein-calorie malnutrition: Secondary | ICD-10-CM | POA: Diagnosis not present

## 2017-04-07 DIAGNOSIS — I679 Cerebrovascular disease, unspecified: Secondary | ICD-10-CM | POA: Diagnosis not present

## 2017-04-07 DIAGNOSIS — G309 Alzheimer's disease, unspecified: Secondary | ICD-10-CM | POA: Diagnosis not present

## 2017-04-07 DIAGNOSIS — E46 Unspecified protein-calorie malnutrition: Secondary | ICD-10-CM | POA: Diagnosis not present

## 2017-04-07 DIAGNOSIS — R1310 Dysphagia, unspecified: Secondary | ICD-10-CM | POA: Diagnosis not present

## 2017-04-07 DIAGNOSIS — I4891 Unspecified atrial fibrillation: Secondary | ICD-10-CM | POA: Diagnosis not present

## 2017-04-07 DIAGNOSIS — R569 Unspecified convulsions: Secondary | ICD-10-CM | POA: Diagnosis not present

## 2017-04-08 DIAGNOSIS — I4891 Unspecified atrial fibrillation: Secondary | ICD-10-CM | POA: Diagnosis not present

## 2017-04-08 DIAGNOSIS — R1310 Dysphagia, unspecified: Secondary | ICD-10-CM | POA: Diagnosis not present

## 2017-04-08 DIAGNOSIS — G309 Alzheimer's disease, unspecified: Secondary | ICD-10-CM | POA: Diagnosis not present

## 2017-04-08 DIAGNOSIS — I679 Cerebrovascular disease, unspecified: Secondary | ICD-10-CM | POA: Diagnosis not present

## 2017-04-08 DIAGNOSIS — R569 Unspecified convulsions: Secondary | ICD-10-CM | POA: Diagnosis not present

## 2017-04-08 DIAGNOSIS — E46 Unspecified protein-calorie malnutrition: Secondary | ICD-10-CM | POA: Diagnosis not present

## 2017-04-12 DIAGNOSIS — R569 Unspecified convulsions: Secondary | ICD-10-CM | POA: Diagnosis not present

## 2017-04-12 DIAGNOSIS — R1310 Dysphagia, unspecified: Secondary | ICD-10-CM | POA: Diagnosis not present

## 2017-04-12 DIAGNOSIS — G309 Alzheimer's disease, unspecified: Secondary | ICD-10-CM | POA: Diagnosis not present

## 2017-04-12 DIAGNOSIS — E46 Unspecified protein-calorie malnutrition: Secondary | ICD-10-CM | POA: Diagnosis not present

## 2017-04-12 DIAGNOSIS — I4891 Unspecified atrial fibrillation: Secondary | ICD-10-CM | POA: Diagnosis not present

## 2017-04-12 DIAGNOSIS — I679 Cerebrovascular disease, unspecified: Secondary | ICD-10-CM | POA: Diagnosis not present

## 2017-04-14 DIAGNOSIS — E46 Unspecified protein-calorie malnutrition: Secondary | ICD-10-CM | POA: Diagnosis not present

## 2017-04-14 DIAGNOSIS — I679 Cerebrovascular disease, unspecified: Secondary | ICD-10-CM | POA: Diagnosis not present

## 2017-04-14 DIAGNOSIS — R569 Unspecified convulsions: Secondary | ICD-10-CM | POA: Diagnosis not present

## 2017-04-14 DIAGNOSIS — G309 Alzheimer's disease, unspecified: Secondary | ICD-10-CM | POA: Diagnosis not present

## 2017-04-14 DIAGNOSIS — R1310 Dysphagia, unspecified: Secondary | ICD-10-CM | POA: Diagnosis not present

## 2017-04-14 DIAGNOSIS — I4891 Unspecified atrial fibrillation: Secondary | ICD-10-CM | POA: Diagnosis not present

## 2017-04-15 DIAGNOSIS — Z515 Encounter for palliative care: Secondary | ICD-10-CM | POA: Diagnosis not present

## 2017-04-15 DIAGNOSIS — I4891 Unspecified atrial fibrillation: Secondary | ICD-10-CM | POA: Diagnosis not present

## 2017-04-15 DIAGNOSIS — R1310 Dysphagia, unspecified: Secondary | ICD-10-CM | POA: Diagnosis not present

## 2017-04-15 DIAGNOSIS — G3183 Dementia with Lewy bodies: Secondary | ICD-10-CM | POA: Diagnosis not present

## 2017-04-19 DIAGNOSIS — I4891 Unspecified atrial fibrillation: Secondary | ICD-10-CM | POA: Diagnosis not present

## 2017-04-19 DIAGNOSIS — I679 Cerebrovascular disease, unspecified: Secondary | ICD-10-CM | POA: Diagnosis not present

## 2017-04-19 DIAGNOSIS — G309 Alzheimer's disease, unspecified: Secondary | ICD-10-CM | POA: Diagnosis not present

## 2017-04-19 DIAGNOSIS — E46 Unspecified protein-calorie malnutrition: Secondary | ICD-10-CM | POA: Diagnosis not present

## 2017-04-19 DIAGNOSIS — R569 Unspecified convulsions: Secondary | ICD-10-CM | POA: Diagnosis not present

## 2017-04-19 DIAGNOSIS — R1310 Dysphagia, unspecified: Secondary | ICD-10-CM | POA: Diagnosis not present

## 2017-04-21 DIAGNOSIS — I679 Cerebrovascular disease, unspecified: Secondary | ICD-10-CM | POA: Diagnosis not present

## 2017-04-21 DIAGNOSIS — R569 Unspecified convulsions: Secondary | ICD-10-CM | POA: Diagnosis not present

## 2017-04-21 DIAGNOSIS — R1310 Dysphagia, unspecified: Secondary | ICD-10-CM | POA: Diagnosis not present

## 2017-04-21 DIAGNOSIS — G309 Alzheimer's disease, unspecified: Secondary | ICD-10-CM | POA: Diagnosis not present

## 2017-04-21 DIAGNOSIS — E46 Unspecified protein-calorie malnutrition: Secondary | ICD-10-CM | POA: Diagnosis not present

## 2017-04-21 DIAGNOSIS — I4891 Unspecified atrial fibrillation: Secondary | ICD-10-CM | POA: Diagnosis not present

## 2017-04-26 DIAGNOSIS — R569 Unspecified convulsions: Secondary | ICD-10-CM | POA: Diagnosis not present

## 2017-04-26 DIAGNOSIS — I679 Cerebrovascular disease, unspecified: Secondary | ICD-10-CM | POA: Diagnosis not present

## 2017-04-26 DIAGNOSIS — E46 Unspecified protein-calorie malnutrition: Secondary | ICD-10-CM | POA: Diagnosis not present

## 2017-04-26 DIAGNOSIS — I4891 Unspecified atrial fibrillation: Secondary | ICD-10-CM | POA: Diagnosis not present

## 2017-04-26 DIAGNOSIS — G309 Alzheimer's disease, unspecified: Secondary | ICD-10-CM | POA: Diagnosis not present

## 2017-04-26 DIAGNOSIS — R1310 Dysphagia, unspecified: Secondary | ICD-10-CM | POA: Diagnosis not present

## 2017-04-28 DIAGNOSIS — G309 Alzheimer's disease, unspecified: Secondary | ICD-10-CM | POA: Diagnosis not present

## 2017-04-28 DIAGNOSIS — I679 Cerebrovascular disease, unspecified: Secondary | ICD-10-CM | POA: Diagnosis not present

## 2017-04-28 DIAGNOSIS — E46 Unspecified protein-calorie malnutrition: Secondary | ICD-10-CM | POA: Diagnosis not present

## 2017-04-28 DIAGNOSIS — I4891 Unspecified atrial fibrillation: Secondary | ICD-10-CM | POA: Diagnosis not present

## 2017-04-28 DIAGNOSIS — R569 Unspecified convulsions: Secondary | ICD-10-CM | POA: Diagnosis not present

## 2017-04-28 DIAGNOSIS — R1310 Dysphagia, unspecified: Secondary | ICD-10-CM | POA: Diagnosis not present

## 2017-04-29 DIAGNOSIS — I10 Essential (primary) hypertension: Secondary | ICD-10-CM | POA: Diagnosis not present

## 2017-04-29 DIAGNOSIS — R1310 Dysphagia, unspecified: Secondary | ICD-10-CM | POA: Diagnosis not present

## 2017-04-29 DIAGNOSIS — E46 Unspecified protein-calorie malnutrition: Secondary | ICD-10-CM | POA: Diagnosis not present

## 2017-04-29 DIAGNOSIS — E785 Hyperlipidemia, unspecified: Secondary | ICD-10-CM | POA: Diagnosis not present

## 2017-04-29 DIAGNOSIS — J301 Allergic rhinitis due to pollen: Secondary | ICD-10-CM | POA: Diagnosis not present

## 2017-04-29 DIAGNOSIS — G309 Alzheimer's disease, unspecified: Secondary | ICD-10-CM | POA: Diagnosis not present

## 2017-04-29 DIAGNOSIS — B351 Tinea unguium: Secondary | ICD-10-CM | POA: Diagnosis not present

## 2017-04-29 DIAGNOSIS — I679 Cerebrovascular disease, unspecified: Secondary | ICD-10-CM | POA: Diagnosis not present

## 2017-04-29 DIAGNOSIS — M6281 Muscle weakness (generalized): Secondary | ICD-10-CM | POA: Diagnosis not present

## 2017-04-29 DIAGNOSIS — I739 Peripheral vascular disease, unspecified: Secondary | ICD-10-CM | POA: Diagnosis not present

## 2017-04-29 DIAGNOSIS — L603 Nail dystrophy: Secondary | ICD-10-CM | POA: Diagnosis not present

## 2017-04-29 DIAGNOSIS — R569 Unspecified convulsions: Secondary | ICD-10-CM | POA: Diagnosis not present

## 2017-04-29 DIAGNOSIS — I4891 Unspecified atrial fibrillation: Secondary | ICD-10-CM | POA: Diagnosis not present

## 2017-05-01 DIAGNOSIS — G309 Alzheimer's disease, unspecified: Secondary | ICD-10-CM | POA: Diagnosis not present

## 2017-05-01 DIAGNOSIS — E46 Unspecified protein-calorie malnutrition: Secondary | ICD-10-CM | POA: Diagnosis not present

## 2017-05-01 DIAGNOSIS — I4891 Unspecified atrial fibrillation: Secondary | ICD-10-CM | POA: Diagnosis not present

## 2017-05-01 DIAGNOSIS — I679 Cerebrovascular disease, unspecified: Secondary | ICD-10-CM | POA: Diagnosis not present

## 2017-05-01 DIAGNOSIS — R1310 Dysphagia, unspecified: Secondary | ICD-10-CM | POA: Diagnosis not present

## 2017-05-01 DIAGNOSIS — R569 Unspecified convulsions: Secondary | ICD-10-CM | POA: Diagnosis not present

## 2017-05-02 DIAGNOSIS — R1310 Dysphagia, unspecified: Secondary | ICD-10-CM | POA: Diagnosis not present

## 2017-05-02 DIAGNOSIS — R569 Unspecified convulsions: Secondary | ICD-10-CM | POA: Diagnosis not present

## 2017-05-02 DIAGNOSIS — I679 Cerebrovascular disease, unspecified: Secondary | ICD-10-CM | POA: Diagnosis not present

## 2017-05-02 DIAGNOSIS — E46 Unspecified protein-calorie malnutrition: Secondary | ICD-10-CM | POA: Diagnosis not present

## 2017-05-02 DIAGNOSIS — G309 Alzheimer's disease, unspecified: Secondary | ICD-10-CM | POA: Diagnosis not present

## 2017-05-02 DIAGNOSIS — I4891 Unspecified atrial fibrillation: Secondary | ICD-10-CM | POA: Diagnosis not present

## 2017-05-04 DIAGNOSIS — I4891 Unspecified atrial fibrillation: Secondary | ICD-10-CM | POA: Diagnosis not present

## 2017-05-04 DIAGNOSIS — R569 Unspecified convulsions: Secondary | ICD-10-CM | POA: Diagnosis not present

## 2017-05-04 DIAGNOSIS — E46 Unspecified protein-calorie malnutrition: Secondary | ICD-10-CM | POA: Diagnosis not present

## 2017-05-04 DIAGNOSIS — R1310 Dysphagia, unspecified: Secondary | ICD-10-CM | POA: Diagnosis not present

## 2017-05-04 DIAGNOSIS — I679 Cerebrovascular disease, unspecified: Secondary | ICD-10-CM | POA: Diagnosis not present

## 2017-05-04 DIAGNOSIS — G309 Alzheimer's disease, unspecified: Secondary | ICD-10-CM | POA: Diagnosis not present

## 2017-05-05 DIAGNOSIS — E46 Unspecified protein-calorie malnutrition: Secondary | ICD-10-CM | POA: Diagnosis not present

## 2017-05-05 DIAGNOSIS — R569 Unspecified convulsions: Secondary | ICD-10-CM | POA: Diagnosis not present

## 2017-05-05 DIAGNOSIS — R1310 Dysphagia, unspecified: Secondary | ICD-10-CM | POA: Diagnosis not present

## 2017-05-05 DIAGNOSIS — G309 Alzheimer's disease, unspecified: Secondary | ICD-10-CM | POA: Diagnosis not present

## 2017-05-05 DIAGNOSIS — I4891 Unspecified atrial fibrillation: Secondary | ICD-10-CM | POA: Diagnosis not present

## 2017-05-05 DIAGNOSIS — I679 Cerebrovascular disease, unspecified: Secondary | ICD-10-CM | POA: Diagnosis not present

## 2017-05-09 DIAGNOSIS — I4891 Unspecified atrial fibrillation: Secondary | ICD-10-CM | POA: Diagnosis not present

## 2017-05-09 DIAGNOSIS — R569 Unspecified convulsions: Secondary | ICD-10-CM | POA: Diagnosis not present

## 2017-05-09 DIAGNOSIS — G309 Alzheimer's disease, unspecified: Secondary | ICD-10-CM | POA: Diagnosis not present

## 2017-05-09 DIAGNOSIS — E46 Unspecified protein-calorie malnutrition: Secondary | ICD-10-CM | POA: Diagnosis not present

## 2017-05-09 DIAGNOSIS — R1310 Dysphagia, unspecified: Secondary | ICD-10-CM | POA: Diagnosis not present

## 2017-05-09 DIAGNOSIS — I679 Cerebrovascular disease, unspecified: Secondary | ICD-10-CM | POA: Diagnosis not present

## 2017-05-10 DIAGNOSIS — R1310 Dysphagia, unspecified: Secondary | ICD-10-CM | POA: Diagnosis not present

## 2017-05-10 DIAGNOSIS — I4891 Unspecified atrial fibrillation: Secondary | ICD-10-CM | POA: Diagnosis not present

## 2017-05-10 DIAGNOSIS — E46 Unspecified protein-calorie malnutrition: Secondary | ICD-10-CM | POA: Diagnosis not present

## 2017-05-10 DIAGNOSIS — I679 Cerebrovascular disease, unspecified: Secondary | ICD-10-CM | POA: Diagnosis not present

## 2017-05-10 DIAGNOSIS — G309 Alzheimer's disease, unspecified: Secondary | ICD-10-CM | POA: Diagnosis not present

## 2017-05-10 DIAGNOSIS — R569 Unspecified convulsions: Secondary | ICD-10-CM | POA: Diagnosis not present

## 2017-05-12 DIAGNOSIS — R278 Other lack of coordination: Secondary | ICD-10-CM | POA: Diagnosis not present

## 2017-05-12 DIAGNOSIS — I679 Cerebrovascular disease, unspecified: Secondary | ICD-10-CM | POA: Diagnosis not present

## 2017-05-12 DIAGNOSIS — E46 Unspecified protein-calorie malnutrition: Secondary | ICD-10-CM | POA: Diagnosis not present

## 2017-05-12 DIAGNOSIS — R1312 Dysphagia, oropharyngeal phase: Secondary | ICD-10-CM | POA: Diagnosis not present

## 2017-05-12 DIAGNOSIS — R1310 Dysphagia, unspecified: Secondary | ICD-10-CM | POA: Diagnosis not present

## 2017-05-12 DIAGNOSIS — I6990 Unspecified sequelae of unspecified cerebrovascular disease: Secondary | ICD-10-CM | POA: Diagnosis not present

## 2017-05-12 DIAGNOSIS — R569 Unspecified convulsions: Secondary | ICD-10-CM | POA: Diagnosis not present

## 2017-05-12 DIAGNOSIS — G309 Alzheimer's disease, unspecified: Secondary | ICD-10-CM | POA: Diagnosis not present

## 2017-05-12 DIAGNOSIS — I4891 Unspecified atrial fibrillation: Secondary | ICD-10-CM | POA: Diagnosis not present

## 2017-05-13 DIAGNOSIS — G309 Alzheimer's disease, unspecified: Secondary | ICD-10-CM | POA: Diagnosis not present

## 2017-05-13 DIAGNOSIS — E46 Unspecified protein-calorie malnutrition: Secondary | ICD-10-CM | POA: Diagnosis not present

## 2017-05-13 DIAGNOSIS — R278 Other lack of coordination: Secondary | ICD-10-CM | POA: Diagnosis not present

## 2017-05-13 DIAGNOSIS — I6990 Unspecified sequelae of unspecified cerebrovascular disease: Secondary | ICD-10-CM | POA: Diagnosis not present

## 2017-05-13 DIAGNOSIS — I4891 Unspecified atrial fibrillation: Secondary | ICD-10-CM | POA: Diagnosis not present

## 2017-05-13 DIAGNOSIS — I679 Cerebrovascular disease, unspecified: Secondary | ICD-10-CM | POA: Diagnosis not present

## 2017-05-13 DIAGNOSIS — R1312 Dysphagia, oropharyngeal phase: Secondary | ICD-10-CM | POA: Diagnosis not present

## 2017-05-13 DIAGNOSIS — R569 Unspecified convulsions: Secondary | ICD-10-CM | POA: Diagnosis not present

## 2017-05-13 DIAGNOSIS — R1310 Dysphagia, unspecified: Secondary | ICD-10-CM | POA: Diagnosis not present

## 2017-05-16 DIAGNOSIS — R569 Unspecified convulsions: Secondary | ICD-10-CM | POA: Diagnosis not present

## 2017-05-16 DIAGNOSIS — R1310 Dysphagia, unspecified: Secondary | ICD-10-CM | POA: Diagnosis not present

## 2017-05-16 DIAGNOSIS — G309 Alzheimer's disease, unspecified: Secondary | ICD-10-CM | POA: Diagnosis not present

## 2017-05-16 DIAGNOSIS — I679 Cerebrovascular disease, unspecified: Secondary | ICD-10-CM | POA: Diagnosis not present

## 2017-05-16 DIAGNOSIS — I4891 Unspecified atrial fibrillation: Secondary | ICD-10-CM | POA: Diagnosis not present

## 2017-05-16 DIAGNOSIS — E46 Unspecified protein-calorie malnutrition: Secondary | ICD-10-CM | POA: Diagnosis not present

## 2017-05-17 DIAGNOSIS — I4891 Unspecified atrial fibrillation: Secondary | ICD-10-CM | POA: Diagnosis not present

## 2017-05-17 DIAGNOSIS — I679 Cerebrovascular disease, unspecified: Secondary | ICD-10-CM | POA: Diagnosis not present

## 2017-05-17 DIAGNOSIS — E46 Unspecified protein-calorie malnutrition: Secondary | ICD-10-CM | POA: Diagnosis not present

## 2017-05-17 DIAGNOSIS — R1310 Dysphagia, unspecified: Secondary | ICD-10-CM | POA: Diagnosis not present

## 2017-05-17 DIAGNOSIS — R569 Unspecified convulsions: Secondary | ICD-10-CM | POA: Diagnosis not present

## 2017-05-17 DIAGNOSIS — G309 Alzheimer's disease, unspecified: Secondary | ICD-10-CM | POA: Diagnosis not present

## 2017-05-19 DIAGNOSIS — R569 Unspecified convulsions: Secondary | ICD-10-CM | POA: Diagnosis not present

## 2017-05-19 DIAGNOSIS — E46 Unspecified protein-calorie malnutrition: Secondary | ICD-10-CM | POA: Diagnosis not present

## 2017-05-19 DIAGNOSIS — R1310 Dysphagia, unspecified: Secondary | ICD-10-CM | POA: Diagnosis not present

## 2017-05-19 DIAGNOSIS — I679 Cerebrovascular disease, unspecified: Secondary | ICD-10-CM | POA: Diagnosis not present

## 2017-05-19 DIAGNOSIS — G309 Alzheimer's disease, unspecified: Secondary | ICD-10-CM | POA: Diagnosis not present

## 2017-05-19 DIAGNOSIS — I4891 Unspecified atrial fibrillation: Secondary | ICD-10-CM | POA: Diagnosis not present

## 2017-05-20 DIAGNOSIS — R569 Unspecified convulsions: Secondary | ICD-10-CM | POA: Diagnosis not present

## 2017-05-20 DIAGNOSIS — G309 Alzheimer's disease, unspecified: Secondary | ICD-10-CM | POA: Diagnosis not present

## 2017-05-20 DIAGNOSIS — E46 Unspecified protein-calorie malnutrition: Secondary | ICD-10-CM | POA: Diagnosis not present

## 2017-05-20 DIAGNOSIS — I679 Cerebrovascular disease, unspecified: Secondary | ICD-10-CM | POA: Diagnosis not present

## 2017-05-20 DIAGNOSIS — I4891 Unspecified atrial fibrillation: Secondary | ICD-10-CM | POA: Diagnosis not present

## 2017-05-20 DIAGNOSIS — R1310 Dysphagia, unspecified: Secondary | ICD-10-CM | POA: Diagnosis not present

## 2017-05-24 DIAGNOSIS — E46 Unspecified protein-calorie malnutrition: Secondary | ICD-10-CM | POA: Diagnosis not present

## 2017-05-24 DIAGNOSIS — R569 Unspecified convulsions: Secondary | ICD-10-CM | POA: Diagnosis not present

## 2017-05-24 DIAGNOSIS — I679 Cerebrovascular disease, unspecified: Secondary | ICD-10-CM | POA: Diagnosis not present

## 2017-05-24 DIAGNOSIS — G309 Alzheimer's disease, unspecified: Secondary | ICD-10-CM | POA: Diagnosis not present

## 2017-05-24 DIAGNOSIS — R1310 Dysphagia, unspecified: Secondary | ICD-10-CM | POA: Diagnosis not present

## 2017-05-24 DIAGNOSIS — I4891 Unspecified atrial fibrillation: Secondary | ICD-10-CM | POA: Diagnosis not present

## 2017-05-26 DIAGNOSIS — I4891 Unspecified atrial fibrillation: Secondary | ICD-10-CM | POA: Diagnosis not present

## 2017-05-26 DIAGNOSIS — G309 Alzheimer's disease, unspecified: Secondary | ICD-10-CM | POA: Diagnosis not present

## 2017-05-26 DIAGNOSIS — R569 Unspecified convulsions: Secondary | ICD-10-CM | POA: Diagnosis not present

## 2017-05-26 DIAGNOSIS — E46 Unspecified protein-calorie malnutrition: Secondary | ICD-10-CM | POA: Diagnosis not present

## 2017-05-26 DIAGNOSIS — I679 Cerebrovascular disease, unspecified: Secondary | ICD-10-CM | POA: Diagnosis not present

## 2017-05-26 DIAGNOSIS — R1310 Dysphagia, unspecified: Secondary | ICD-10-CM | POA: Diagnosis not present

## 2017-05-27 DIAGNOSIS — I10 Essential (primary) hypertension: Secondary | ICD-10-CM | POA: Diagnosis not present

## 2017-05-27 DIAGNOSIS — J301 Allergic rhinitis due to pollen: Secondary | ICD-10-CM | POA: Diagnosis not present

## 2017-05-27 DIAGNOSIS — R1310 Dysphagia, unspecified: Secondary | ICD-10-CM | POA: Diagnosis not present

## 2017-05-27 DIAGNOSIS — G309 Alzheimer's disease, unspecified: Secondary | ICD-10-CM | POA: Diagnosis not present

## 2017-05-27 DIAGNOSIS — I679 Cerebrovascular disease, unspecified: Secondary | ICD-10-CM | POA: Diagnosis not present

## 2017-05-27 DIAGNOSIS — I4891 Unspecified atrial fibrillation: Secondary | ICD-10-CM | POA: Diagnosis not present

## 2017-05-27 DIAGNOSIS — E46 Unspecified protein-calorie malnutrition: Secondary | ICD-10-CM | POA: Diagnosis not present

## 2017-05-27 DIAGNOSIS — R569 Unspecified convulsions: Secondary | ICD-10-CM | POA: Diagnosis not present

## 2017-05-27 DIAGNOSIS — E785 Hyperlipidemia, unspecified: Secondary | ICD-10-CM | POA: Diagnosis not present

## 2017-05-31 DIAGNOSIS — R1310 Dysphagia, unspecified: Secondary | ICD-10-CM | POA: Diagnosis not present

## 2017-05-31 DIAGNOSIS — G309 Alzheimer's disease, unspecified: Secondary | ICD-10-CM | POA: Diagnosis not present

## 2017-05-31 DIAGNOSIS — I4891 Unspecified atrial fibrillation: Secondary | ICD-10-CM | POA: Diagnosis not present

## 2017-05-31 DIAGNOSIS — I679 Cerebrovascular disease, unspecified: Secondary | ICD-10-CM | POA: Diagnosis not present

## 2017-05-31 DIAGNOSIS — R569 Unspecified convulsions: Secondary | ICD-10-CM | POA: Diagnosis not present

## 2017-05-31 DIAGNOSIS — E46 Unspecified protein-calorie malnutrition: Secondary | ICD-10-CM | POA: Diagnosis not present

## 2017-06-02 DIAGNOSIS — G309 Alzheimer's disease, unspecified: Secondary | ICD-10-CM | POA: Diagnosis not present

## 2017-06-02 DIAGNOSIS — R1310 Dysphagia, unspecified: Secondary | ICD-10-CM | POA: Diagnosis not present

## 2017-06-02 DIAGNOSIS — E46 Unspecified protein-calorie malnutrition: Secondary | ICD-10-CM | POA: Diagnosis not present

## 2017-06-02 DIAGNOSIS — R569 Unspecified convulsions: Secondary | ICD-10-CM | POA: Diagnosis not present

## 2017-06-02 DIAGNOSIS — I4891 Unspecified atrial fibrillation: Secondary | ICD-10-CM | POA: Diagnosis not present

## 2017-06-02 DIAGNOSIS — I679 Cerebrovascular disease, unspecified: Secondary | ICD-10-CM | POA: Diagnosis not present

## 2017-06-06 DIAGNOSIS — I679 Cerebrovascular disease, unspecified: Secondary | ICD-10-CM | POA: Diagnosis not present

## 2017-06-06 DIAGNOSIS — I4891 Unspecified atrial fibrillation: Secondary | ICD-10-CM | POA: Diagnosis not present

## 2017-06-06 DIAGNOSIS — R1310 Dysphagia, unspecified: Secondary | ICD-10-CM | POA: Diagnosis not present

## 2017-06-06 DIAGNOSIS — R569 Unspecified convulsions: Secondary | ICD-10-CM | POA: Diagnosis not present

## 2017-06-06 DIAGNOSIS — E46 Unspecified protein-calorie malnutrition: Secondary | ICD-10-CM | POA: Diagnosis not present

## 2017-06-06 DIAGNOSIS — G309 Alzheimer's disease, unspecified: Secondary | ICD-10-CM | POA: Diagnosis not present

## 2017-06-08 DIAGNOSIS — E46 Unspecified protein-calorie malnutrition: Secondary | ICD-10-CM | POA: Diagnosis not present

## 2017-06-08 DIAGNOSIS — R1310 Dysphagia, unspecified: Secondary | ICD-10-CM | POA: Diagnosis not present

## 2017-06-08 DIAGNOSIS — I679 Cerebrovascular disease, unspecified: Secondary | ICD-10-CM | POA: Diagnosis not present

## 2017-06-08 DIAGNOSIS — G309 Alzheimer's disease, unspecified: Secondary | ICD-10-CM | POA: Diagnosis not present

## 2017-06-08 DIAGNOSIS — R569 Unspecified convulsions: Secondary | ICD-10-CM | POA: Diagnosis not present

## 2017-06-08 DIAGNOSIS — I4891 Unspecified atrial fibrillation: Secondary | ICD-10-CM | POA: Diagnosis not present

## 2017-06-09 DIAGNOSIS — E46 Unspecified protein-calorie malnutrition: Secondary | ICD-10-CM | POA: Diagnosis not present

## 2017-06-09 DIAGNOSIS — R569 Unspecified convulsions: Secondary | ICD-10-CM | POA: Diagnosis not present

## 2017-06-09 DIAGNOSIS — R1310 Dysphagia, unspecified: Secondary | ICD-10-CM | POA: Diagnosis not present

## 2017-06-09 DIAGNOSIS — I679 Cerebrovascular disease, unspecified: Secondary | ICD-10-CM | POA: Diagnosis not present

## 2017-06-09 DIAGNOSIS — I4891 Unspecified atrial fibrillation: Secondary | ICD-10-CM | POA: Diagnosis not present

## 2017-06-09 DIAGNOSIS — G309 Alzheimer's disease, unspecified: Secondary | ICD-10-CM | POA: Diagnosis not present

## 2017-06-13 DIAGNOSIS — E46 Unspecified protein-calorie malnutrition: Secondary | ICD-10-CM | POA: Diagnosis not present

## 2017-06-13 DIAGNOSIS — R569 Unspecified convulsions: Secondary | ICD-10-CM | POA: Diagnosis not present

## 2017-06-13 DIAGNOSIS — I4891 Unspecified atrial fibrillation: Secondary | ICD-10-CM | POA: Diagnosis not present

## 2017-06-13 DIAGNOSIS — G309 Alzheimer's disease, unspecified: Secondary | ICD-10-CM | POA: Diagnosis not present

## 2017-06-13 DIAGNOSIS — R1310 Dysphagia, unspecified: Secondary | ICD-10-CM | POA: Diagnosis not present

## 2017-06-13 DIAGNOSIS — I679 Cerebrovascular disease, unspecified: Secondary | ICD-10-CM | POA: Diagnosis not present

## 2017-06-14 DIAGNOSIS — I4891 Unspecified atrial fibrillation: Secondary | ICD-10-CM | POA: Diagnosis not present

## 2017-06-14 DIAGNOSIS — Z87898 Personal history of other specified conditions: Secondary | ICD-10-CM | POA: Diagnosis not present

## 2017-06-14 DIAGNOSIS — G3183 Dementia with Lewy bodies: Secondary | ICD-10-CM | POA: Diagnosis not present

## 2017-06-15 DIAGNOSIS — E46 Unspecified protein-calorie malnutrition: Secondary | ICD-10-CM | POA: Diagnosis not present

## 2017-06-15 DIAGNOSIS — R569 Unspecified convulsions: Secondary | ICD-10-CM | POA: Diagnosis not present

## 2017-06-15 DIAGNOSIS — R1310 Dysphagia, unspecified: Secondary | ICD-10-CM | POA: Diagnosis not present

## 2017-06-15 DIAGNOSIS — I4891 Unspecified atrial fibrillation: Secondary | ICD-10-CM | POA: Diagnosis not present

## 2017-06-15 DIAGNOSIS — G309 Alzheimer's disease, unspecified: Secondary | ICD-10-CM | POA: Diagnosis not present

## 2017-06-15 DIAGNOSIS — I679 Cerebrovascular disease, unspecified: Secondary | ICD-10-CM | POA: Diagnosis not present

## 2017-06-16 DIAGNOSIS — I4891 Unspecified atrial fibrillation: Secondary | ICD-10-CM | POA: Diagnosis not present

## 2017-06-16 DIAGNOSIS — I679 Cerebrovascular disease, unspecified: Secondary | ICD-10-CM | POA: Diagnosis not present

## 2017-06-16 DIAGNOSIS — R1310 Dysphagia, unspecified: Secondary | ICD-10-CM | POA: Diagnosis not present

## 2017-06-16 DIAGNOSIS — G309 Alzheimer's disease, unspecified: Secondary | ICD-10-CM | POA: Diagnosis not present

## 2017-06-16 DIAGNOSIS — R569 Unspecified convulsions: Secondary | ICD-10-CM | POA: Diagnosis not present

## 2017-06-16 DIAGNOSIS — E46 Unspecified protein-calorie malnutrition: Secondary | ICD-10-CM | POA: Diagnosis not present

## 2017-06-20 DIAGNOSIS — I4891 Unspecified atrial fibrillation: Secondary | ICD-10-CM | POA: Diagnosis not present

## 2017-06-20 DIAGNOSIS — G309 Alzheimer's disease, unspecified: Secondary | ICD-10-CM | POA: Diagnosis not present

## 2017-06-20 DIAGNOSIS — R1310 Dysphagia, unspecified: Secondary | ICD-10-CM | POA: Diagnosis not present

## 2017-06-20 DIAGNOSIS — R569 Unspecified convulsions: Secondary | ICD-10-CM | POA: Diagnosis not present

## 2017-06-20 DIAGNOSIS — I679 Cerebrovascular disease, unspecified: Secondary | ICD-10-CM | POA: Diagnosis not present

## 2017-06-20 DIAGNOSIS — E46 Unspecified protein-calorie malnutrition: Secondary | ICD-10-CM | POA: Diagnosis not present

## 2017-06-22 DIAGNOSIS — E46 Unspecified protein-calorie malnutrition: Secondary | ICD-10-CM | POA: Diagnosis not present

## 2017-06-22 DIAGNOSIS — R1310 Dysphagia, unspecified: Secondary | ICD-10-CM | POA: Diagnosis not present

## 2017-06-22 DIAGNOSIS — R569 Unspecified convulsions: Secondary | ICD-10-CM | POA: Diagnosis not present

## 2017-06-22 DIAGNOSIS — I4891 Unspecified atrial fibrillation: Secondary | ICD-10-CM | POA: Diagnosis not present

## 2017-06-22 DIAGNOSIS — G309 Alzheimer's disease, unspecified: Secondary | ICD-10-CM | POA: Diagnosis not present

## 2017-06-22 DIAGNOSIS — I679 Cerebrovascular disease, unspecified: Secondary | ICD-10-CM | POA: Diagnosis not present

## 2017-06-23 DIAGNOSIS — E46 Unspecified protein-calorie malnutrition: Secondary | ICD-10-CM | POA: Diagnosis not present

## 2017-06-23 DIAGNOSIS — R569 Unspecified convulsions: Secondary | ICD-10-CM | POA: Diagnosis not present

## 2017-06-23 DIAGNOSIS — I679 Cerebrovascular disease, unspecified: Secondary | ICD-10-CM | POA: Diagnosis not present

## 2017-06-23 DIAGNOSIS — R1310 Dysphagia, unspecified: Secondary | ICD-10-CM | POA: Diagnosis not present

## 2017-06-23 DIAGNOSIS — I4891 Unspecified atrial fibrillation: Secondary | ICD-10-CM | POA: Diagnosis not present

## 2017-06-23 DIAGNOSIS — G309 Alzheimer's disease, unspecified: Secondary | ICD-10-CM | POA: Diagnosis not present

## 2017-06-24 DIAGNOSIS — R569 Unspecified convulsions: Secondary | ICD-10-CM | POA: Diagnosis not present

## 2017-06-24 DIAGNOSIS — G309 Alzheimer's disease, unspecified: Secondary | ICD-10-CM | POA: Diagnosis not present

## 2017-06-24 DIAGNOSIS — I679 Cerebrovascular disease, unspecified: Secondary | ICD-10-CM | POA: Diagnosis not present

## 2017-06-24 DIAGNOSIS — I4891 Unspecified atrial fibrillation: Secondary | ICD-10-CM | POA: Diagnosis not present

## 2017-06-24 DIAGNOSIS — E46 Unspecified protein-calorie malnutrition: Secondary | ICD-10-CM | POA: Diagnosis not present

## 2017-06-24 DIAGNOSIS — R1310 Dysphagia, unspecified: Secondary | ICD-10-CM | POA: Diagnosis not present

## 2017-06-27 DIAGNOSIS — I679 Cerebrovascular disease, unspecified: Secondary | ICD-10-CM | POA: Diagnosis not present

## 2017-06-27 DIAGNOSIS — I4891 Unspecified atrial fibrillation: Secondary | ICD-10-CM | POA: Diagnosis not present

## 2017-06-27 DIAGNOSIS — R569 Unspecified convulsions: Secondary | ICD-10-CM | POA: Diagnosis not present

## 2017-06-27 DIAGNOSIS — E785 Hyperlipidemia, unspecified: Secondary | ICD-10-CM | POA: Diagnosis not present

## 2017-06-27 DIAGNOSIS — E46 Unspecified protein-calorie malnutrition: Secondary | ICD-10-CM | POA: Diagnosis not present

## 2017-06-27 DIAGNOSIS — R1310 Dysphagia, unspecified: Secondary | ICD-10-CM | POA: Diagnosis not present

## 2017-06-27 DIAGNOSIS — G309 Alzheimer's disease, unspecified: Secondary | ICD-10-CM | POA: Diagnosis not present

## 2017-06-27 DIAGNOSIS — I10 Essential (primary) hypertension: Secondary | ICD-10-CM | POA: Diagnosis not present

## 2017-06-27 DIAGNOSIS — J301 Allergic rhinitis due to pollen: Secondary | ICD-10-CM | POA: Diagnosis not present

## 2017-06-28 DIAGNOSIS — I4891 Unspecified atrial fibrillation: Secondary | ICD-10-CM | POA: Diagnosis not present

## 2017-06-28 DIAGNOSIS — R569 Unspecified convulsions: Secondary | ICD-10-CM | POA: Diagnosis not present

## 2017-06-28 DIAGNOSIS — E46 Unspecified protein-calorie malnutrition: Secondary | ICD-10-CM | POA: Diagnosis not present

## 2017-06-28 DIAGNOSIS — I679 Cerebrovascular disease, unspecified: Secondary | ICD-10-CM | POA: Diagnosis not present

## 2017-06-28 DIAGNOSIS — R1310 Dysphagia, unspecified: Secondary | ICD-10-CM | POA: Diagnosis not present

## 2017-06-28 DIAGNOSIS — G309 Alzheimer's disease, unspecified: Secondary | ICD-10-CM | POA: Diagnosis not present

## 2017-06-29 DIAGNOSIS — I4891 Unspecified atrial fibrillation: Secondary | ICD-10-CM | POA: Diagnosis not present

## 2017-06-29 DIAGNOSIS — R1310 Dysphagia, unspecified: Secondary | ICD-10-CM | POA: Diagnosis not present

## 2017-06-29 DIAGNOSIS — I679 Cerebrovascular disease, unspecified: Secondary | ICD-10-CM | POA: Diagnosis not present

## 2017-06-29 DIAGNOSIS — R569 Unspecified convulsions: Secondary | ICD-10-CM | POA: Diagnosis not present

## 2017-06-29 DIAGNOSIS — E46 Unspecified protein-calorie malnutrition: Secondary | ICD-10-CM | POA: Diagnosis not present

## 2017-06-29 DIAGNOSIS — G309 Alzheimer's disease, unspecified: Secondary | ICD-10-CM | POA: Diagnosis not present

## 2017-06-30 DIAGNOSIS — I4891 Unspecified atrial fibrillation: Secondary | ICD-10-CM | POA: Diagnosis not present

## 2017-06-30 DIAGNOSIS — I679 Cerebrovascular disease, unspecified: Secondary | ICD-10-CM | POA: Diagnosis not present

## 2017-06-30 DIAGNOSIS — R1310 Dysphagia, unspecified: Secondary | ICD-10-CM | POA: Diagnosis not present

## 2017-06-30 DIAGNOSIS — E46 Unspecified protein-calorie malnutrition: Secondary | ICD-10-CM | POA: Diagnosis not present

## 2017-06-30 DIAGNOSIS — G309 Alzheimer's disease, unspecified: Secondary | ICD-10-CM | POA: Diagnosis not present

## 2017-06-30 DIAGNOSIS — R569 Unspecified convulsions: Secondary | ICD-10-CM | POA: Diagnosis not present

## 2017-07-04 DIAGNOSIS — I4891 Unspecified atrial fibrillation: Secondary | ICD-10-CM | POA: Diagnosis not present

## 2017-07-04 DIAGNOSIS — R1310 Dysphagia, unspecified: Secondary | ICD-10-CM | POA: Diagnosis not present

## 2017-07-04 DIAGNOSIS — I679 Cerebrovascular disease, unspecified: Secondary | ICD-10-CM | POA: Diagnosis not present

## 2017-07-04 DIAGNOSIS — R569 Unspecified convulsions: Secondary | ICD-10-CM | POA: Diagnosis not present

## 2017-07-04 DIAGNOSIS — G309 Alzheimer's disease, unspecified: Secondary | ICD-10-CM | POA: Diagnosis not present

## 2017-07-04 DIAGNOSIS — E46 Unspecified protein-calorie malnutrition: Secondary | ICD-10-CM | POA: Diagnosis not present

## 2017-07-06 DIAGNOSIS — I4891 Unspecified atrial fibrillation: Secondary | ICD-10-CM | POA: Diagnosis not present

## 2017-07-06 DIAGNOSIS — E46 Unspecified protein-calorie malnutrition: Secondary | ICD-10-CM | POA: Diagnosis not present

## 2017-07-06 DIAGNOSIS — R569 Unspecified convulsions: Secondary | ICD-10-CM | POA: Diagnosis not present

## 2017-07-06 DIAGNOSIS — G309 Alzheimer's disease, unspecified: Secondary | ICD-10-CM | POA: Diagnosis not present

## 2017-07-06 DIAGNOSIS — I679 Cerebrovascular disease, unspecified: Secondary | ICD-10-CM | POA: Diagnosis not present

## 2017-07-06 DIAGNOSIS — R1310 Dysphagia, unspecified: Secondary | ICD-10-CM | POA: Diagnosis not present

## 2017-07-07 DIAGNOSIS — R1310 Dysphagia, unspecified: Secondary | ICD-10-CM | POA: Diagnosis not present

## 2017-07-07 DIAGNOSIS — I4891 Unspecified atrial fibrillation: Secondary | ICD-10-CM | POA: Diagnosis not present

## 2017-07-07 DIAGNOSIS — R569 Unspecified convulsions: Secondary | ICD-10-CM | POA: Diagnosis not present

## 2017-07-07 DIAGNOSIS — G309 Alzheimer's disease, unspecified: Secondary | ICD-10-CM | POA: Diagnosis not present

## 2017-07-07 DIAGNOSIS — I679 Cerebrovascular disease, unspecified: Secondary | ICD-10-CM | POA: Diagnosis not present

## 2017-07-07 DIAGNOSIS — E46 Unspecified protein-calorie malnutrition: Secondary | ICD-10-CM | POA: Diagnosis not present

## 2017-07-11 DIAGNOSIS — I679 Cerebrovascular disease, unspecified: Secondary | ICD-10-CM | POA: Diagnosis not present

## 2017-07-11 DIAGNOSIS — R1310 Dysphagia, unspecified: Secondary | ICD-10-CM | POA: Diagnosis not present

## 2017-07-11 DIAGNOSIS — E46 Unspecified protein-calorie malnutrition: Secondary | ICD-10-CM | POA: Diagnosis not present

## 2017-07-11 DIAGNOSIS — I4891 Unspecified atrial fibrillation: Secondary | ICD-10-CM | POA: Diagnosis not present

## 2017-07-11 DIAGNOSIS — R569 Unspecified convulsions: Secondary | ICD-10-CM | POA: Diagnosis not present

## 2017-07-11 DIAGNOSIS — G309 Alzheimer's disease, unspecified: Secondary | ICD-10-CM | POA: Diagnosis not present

## 2017-07-13 DIAGNOSIS — I4891 Unspecified atrial fibrillation: Secondary | ICD-10-CM | POA: Diagnosis not present

## 2017-07-13 DIAGNOSIS — I679 Cerebrovascular disease, unspecified: Secondary | ICD-10-CM | POA: Diagnosis not present

## 2017-07-13 DIAGNOSIS — R569 Unspecified convulsions: Secondary | ICD-10-CM | POA: Diagnosis not present

## 2017-07-13 DIAGNOSIS — E46 Unspecified protein-calorie malnutrition: Secondary | ICD-10-CM | POA: Diagnosis not present

## 2017-07-13 DIAGNOSIS — G309 Alzheimer's disease, unspecified: Secondary | ICD-10-CM | POA: Diagnosis not present

## 2017-07-13 DIAGNOSIS — R1310 Dysphagia, unspecified: Secondary | ICD-10-CM | POA: Diagnosis not present

## 2017-07-14 DIAGNOSIS — G309 Alzheimer's disease, unspecified: Secondary | ICD-10-CM | POA: Diagnosis not present

## 2017-07-14 DIAGNOSIS — I679 Cerebrovascular disease, unspecified: Secondary | ICD-10-CM | POA: Diagnosis not present

## 2017-07-14 DIAGNOSIS — R1310 Dysphagia, unspecified: Secondary | ICD-10-CM | POA: Diagnosis not present

## 2017-07-14 DIAGNOSIS — E46 Unspecified protein-calorie malnutrition: Secondary | ICD-10-CM | POA: Diagnosis not present

## 2017-07-14 DIAGNOSIS — R569 Unspecified convulsions: Secondary | ICD-10-CM | POA: Diagnosis not present

## 2017-07-14 DIAGNOSIS — I4891 Unspecified atrial fibrillation: Secondary | ICD-10-CM | POA: Diagnosis not present

## 2017-07-15 DIAGNOSIS — L603 Nail dystrophy: Secondary | ICD-10-CM | POA: Diagnosis not present

## 2017-07-15 DIAGNOSIS — B351 Tinea unguium: Secondary | ICD-10-CM | POA: Diagnosis not present

## 2017-07-15 DIAGNOSIS — I739 Peripheral vascular disease, unspecified: Secondary | ICD-10-CM | POA: Diagnosis not present

## 2017-07-15 DIAGNOSIS — M6281 Muscle weakness (generalized): Secondary | ICD-10-CM | POA: Diagnosis not present

## 2017-07-18 DIAGNOSIS — R569 Unspecified convulsions: Secondary | ICD-10-CM | POA: Diagnosis not present

## 2017-07-18 DIAGNOSIS — G309 Alzheimer's disease, unspecified: Secondary | ICD-10-CM | POA: Diagnosis not present

## 2017-07-18 DIAGNOSIS — R1310 Dysphagia, unspecified: Secondary | ICD-10-CM | POA: Diagnosis not present

## 2017-07-18 DIAGNOSIS — I4891 Unspecified atrial fibrillation: Secondary | ICD-10-CM | POA: Diagnosis not present

## 2017-07-18 DIAGNOSIS — E46 Unspecified protein-calorie malnutrition: Secondary | ICD-10-CM | POA: Diagnosis not present

## 2017-07-18 DIAGNOSIS — I679 Cerebrovascular disease, unspecified: Secondary | ICD-10-CM | POA: Diagnosis not present

## 2017-07-20 DIAGNOSIS — I4891 Unspecified atrial fibrillation: Secondary | ICD-10-CM | POA: Diagnosis not present

## 2017-07-20 DIAGNOSIS — R569 Unspecified convulsions: Secondary | ICD-10-CM | POA: Diagnosis not present

## 2017-07-20 DIAGNOSIS — I679 Cerebrovascular disease, unspecified: Secondary | ICD-10-CM | POA: Diagnosis not present

## 2017-07-20 DIAGNOSIS — R1310 Dysphagia, unspecified: Secondary | ICD-10-CM | POA: Diagnosis not present

## 2017-07-20 DIAGNOSIS — G309 Alzheimer's disease, unspecified: Secondary | ICD-10-CM | POA: Diagnosis not present

## 2017-07-20 DIAGNOSIS — E46 Unspecified protein-calorie malnutrition: Secondary | ICD-10-CM | POA: Diagnosis not present

## 2017-07-25 DIAGNOSIS — G309 Alzheimer's disease, unspecified: Secondary | ICD-10-CM | POA: Diagnosis not present

## 2017-07-25 DIAGNOSIS — I4891 Unspecified atrial fibrillation: Secondary | ICD-10-CM | POA: Diagnosis not present

## 2017-07-25 DIAGNOSIS — I679 Cerebrovascular disease, unspecified: Secondary | ICD-10-CM | POA: Diagnosis not present

## 2017-07-25 DIAGNOSIS — R569 Unspecified convulsions: Secondary | ICD-10-CM | POA: Diagnosis not present

## 2017-07-25 DIAGNOSIS — R1310 Dysphagia, unspecified: Secondary | ICD-10-CM | POA: Diagnosis not present

## 2017-07-25 DIAGNOSIS — E46 Unspecified protein-calorie malnutrition: Secondary | ICD-10-CM | POA: Diagnosis not present

## 2017-07-27 DIAGNOSIS — R569 Unspecified convulsions: Secondary | ICD-10-CM | POA: Diagnosis not present

## 2017-07-27 DIAGNOSIS — R1310 Dysphagia, unspecified: Secondary | ICD-10-CM | POA: Diagnosis not present

## 2017-07-27 DIAGNOSIS — E46 Unspecified protein-calorie malnutrition: Secondary | ICD-10-CM | POA: Diagnosis not present

## 2017-07-27 DIAGNOSIS — I679 Cerebrovascular disease, unspecified: Secondary | ICD-10-CM | POA: Diagnosis not present

## 2017-07-27 DIAGNOSIS — G309 Alzheimer's disease, unspecified: Secondary | ICD-10-CM | POA: Diagnosis not present

## 2017-07-27 DIAGNOSIS — I10 Essential (primary) hypertension: Secondary | ICD-10-CM | POA: Diagnosis not present

## 2017-07-27 DIAGNOSIS — E785 Hyperlipidemia, unspecified: Secondary | ICD-10-CM | POA: Diagnosis not present

## 2017-07-27 DIAGNOSIS — I4891 Unspecified atrial fibrillation: Secondary | ICD-10-CM | POA: Diagnosis not present

## 2017-07-27 DIAGNOSIS — J301 Allergic rhinitis due to pollen: Secondary | ICD-10-CM | POA: Diagnosis not present

## 2017-07-29 DIAGNOSIS — G309 Alzheimer's disease, unspecified: Secondary | ICD-10-CM | POA: Diagnosis not present

## 2017-07-29 DIAGNOSIS — I4891 Unspecified atrial fibrillation: Secondary | ICD-10-CM | POA: Diagnosis not present

## 2017-07-29 DIAGNOSIS — R1310 Dysphagia, unspecified: Secondary | ICD-10-CM | POA: Diagnosis not present

## 2017-07-29 DIAGNOSIS — R569 Unspecified convulsions: Secondary | ICD-10-CM | POA: Diagnosis not present

## 2017-07-29 DIAGNOSIS — I679 Cerebrovascular disease, unspecified: Secondary | ICD-10-CM | POA: Diagnosis not present

## 2017-07-29 DIAGNOSIS — E46 Unspecified protein-calorie malnutrition: Secondary | ICD-10-CM | POA: Diagnosis not present

## 2017-08-01 DIAGNOSIS — R569 Unspecified convulsions: Secondary | ICD-10-CM | POA: Diagnosis not present

## 2017-08-01 DIAGNOSIS — I679 Cerebrovascular disease, unspecified: Secondary | ICD-10-CM | POA: Diagnosis not present

## 2017-08-01 DIAGNOSIS — I4891 Unspecified atrial fibrillation: Secondary | ICD-10-CM | POA: Diagnosis not present

## 2017-08-01 DIAGNOSIS — E46 Unspecified protein-calorie malnutrition: Secondary | ICD-10-CM | POA: Diagnosis not present

## 2017-08-01 DIAGNOSIS — G309 Alzheimer's disease, unspecified: Secondary | ICD-10-CM | POA: Diagnosis not present

## 2017-08-01 DIAGNOSIS — R1310 Dysphagia, unspecified: Secondary | ICD-10-CM | POA: Diagnosis not present

## 2017-08-03 DIAGNOSIS — R1310 Dysphagia, unspecified: Secondary | ICD-10-CM | POA: Diagnosis not present

## 2017-08-03 DIAGNOSIS — I679 Cerebrovascular disease, unspecified: Secondary | ICD-10-CM | POA: Diagnosis not present

## 2017-08-03 DIAGNOSIS — G309 Alzheimer's disease, unspecified: Secondary | ICD-10-CM | POA: Diagnosis not present

## 2017-08-03 DIAGNOSIS — R569 Unspecified convulsions: Secondary | ICD-10-CM | POA: Diagnosis not present

## 2017-08-03 DIAGNOSIS — I4891 Unspecified atrial fibrillation: Secondary | ICD-10-CM | POA: Diagnosis not present

## 2017-08-03 DIAGNOSIS — E46 Unspecified protein-calorie malnutrition: Secondary | ICD-10-CM | POA: Diagnosis not present

## 2017-08-04 DIAGNOSIS — R1310 Dysphagia, unspecified: Secondary | ICD-10-CM | POA: Diagnosis not present

## 2017-08-04 DIAGNOSIS — E46 Unspecified protein-calorie malnutrition: Secondary | ICD-10-CM | POA: Diagnosis not present

## 2017-08-04 DIAGNOSIS — I4891 Unspecified atrial fibrillation: Secondary | ICD-10-CM | POA: Diagnosis not present

## 2017-08-04 DIAGNOSIS — G309 Alzheimer's disease, unspecified: Secondary | ICD-10-CM | POA: Diagnosis not present

## 2017-08-04 DIAGNOSIS — I679 Cerebrovascular disease, unspecified: Secondary | ICD-10-CM | POA: Diagnosis not present

## 2017-08-04 DIAGNOSIS — R569 Unspecified convulsions: Secondary | ICD-10-CM | POA: Diagnosis not present

## 2017-08-08 DIAGNOSIS — I679 Cerebrovascular disease, unspecified: Secondary | ICD-10-CM | POA: Diagnosis not present

## 2017-08-08 DIAGNOSIS — I4891 Unspecified atrial fibrillation: Secondary | ICD-10-CM | POA: Diagnosis not present

## 2017-08-08 DIAGNOSIS — R1310 Dysphagia, unspecified: Secondary | ICD-10-CM | POA: Diagnosis not present

## 2017-08-08 DIAGNOSIS — R569 Unspecified convulsions: Secondary | ICD-10-CM | POA: Diagnosis not present

## 2017-08-08 DIAGNOSIS — G309 Alzheimer's disease, unspecified: Secondary | ICD-10-CM | POA: Diagnosis not present

## 2017-08-08 DIAGNOSIS — E46 Unspecified protein-calorie malnutrition: Secondary | ICD-10-CM | POA: Diagnosis not present

## 2017-08-10 DIAGNOSIS — I4891 Unspecified atrial fibrillation: Secondary | ICD-10-CM | POA: Diagnosis not present

## 2017-08-10 DIAGNOSIS — G309 Alzheimer's disease, unspecified: Secondary | ICD-10-CM | POA: Diagnosis not present

## 2017-08-10 DIAGNOSIS — R569 Unspecified convulsions: Secondary | ICD-10-CM | POA: Diagnosis not present

## 2017-08-10 DIAGNOSIS — I679 Cerebrovascular disease, unspecified: Secondary | ICD-10-CM | POA: Diagnosis not present

## 2017-08-10 DIAGNOSIS — E46 Unspecified protein-calorie malnutrition: Secondary | ICD-10-CM | POA: Diagnosis not present

## 2017-08-10 DIAGNOSIS — R1310 Dysphagia, unspecified: Secondary | ICD-10-CM | POA: Diagnosis not present

## 2017-08-12 DIAGNOSIS — E46 Unspecified protein-calorie malnutrition: Secondary | ICD-10-CM | POA: Diagnosis not present

## 2017-08-12 DIAGNOSIS — I679 Cerebrovascular disease, unspecified: Secondary | ICD-10-CM | POA: Diagnosis not present

## 2017-08-12 DIAGNOSIS — R1310 Dysphagia, unspecified: Secondary | ICD-10-CM | POA: Diagnosis not present

## 2017-08-12 DIAGNOSIS — I4891 Unspecified atrial fibrillation: Secondary | ICD-10-CM | POA: Diagnosis not present

## 2017-08-12 DIAGNOSIS — G309 Alzheimer's disease, unspecified: Secondary | ICD-10-CM | POA: Diagnosis not present

## 2017-08-12 DIAGNOSIS — G3183 Dementia with Lewy bodies: Secondary | ICD-10-CM | POA: Diagnosis not present

## 2017-08-12 DIAGNOSIS — Z515 Encounter for palliative care: Secondary | ICD-10-CM | POA: Diagnosis not present

## 2017-08-12 DIAGNOSIS — R569 Unspecified convulsions: Secondary | ICD-10-CM | POA: Diagnosis not present

## 2017-08-12 DIAGNOSIS — R4701 Aphasia: Secondary | ICD-10-CM | POA: Diagnosis not present

## 2017-08-15 DIAGNOSIS — G309 Alzheimer's disease, unspecified: Secondary | ICD-10-CM | POA: Diagnosis not present

## 2017-08-15 DIAGNOSIS — E46 Unspecified protein-calorie malnutrition: Secondary | ICD-10-CM | POA: Diagnosis not present

## 2017-08-15 DIAGNOSIS — I679 Cerebrovascular disease, unspecified: Secondary | ICD-10-CM | POA: Diagnosis not present

## 2017-08-15 DIAGNOSIS — R569 Unspecified convulsions: Secondary | ICD-10-CM | POA: Diagnosis not present

## 2017-08-15 DIAGNOSIS — R1310 Dysphagia, unspecified: Secondary | ICD-10-CM | POA: Diagnosis not present

## 2017-08-15 DIAGNOSIS — I4891 Unspecified atrial fibrillation: Secondary | ICD-10-CM | POA: Diagnosis not present

## 2017-08-17 DIAGNOSIS — E46 Unspecified protein-calorie malnutrition: Secondary | ICD-10-CM | POA: Diagnosis not present

## 2017-08-17 DIAGNOSIS — I679 Cerebrovascular disease, unspecified: Secondary | ICD-10-CM | POA: Diagnosis not present

## 2017-08-17 DIAGNOSIS — I4891 Unspecified atrial fibrillation: Secondary | ICD-10-CM | POA: Diagnosis not present

## 2017-08-17 DIAGNOSIS — R1310 Dysphagia, unspecified: Secondary | ICD-10-CM | POA: Diagnosis not present

## 2017-08-17 DIAGNOSIS — G309 Alzheimer's disease, unspecified: Secondary | ICD-10-CM | POA: Diagnosis not present

## 2017-08-17 DIAGNOSIS — R569 Unspecified convulsions: Secondary | ICD-10-CM | POA: Diagnosis not present

## 2017-08-18 DIAGNOSIS — R1310 Dysphagia, unspecified: Secondary | ICD-10-CM | POA: Diagnosis not present

## 2017-08-18 DIAGNOSIS — I679 Cerebrovascular disease, unspecified: Secondary | ICD-10-CM | POA: Diagnosis not present

## 2017-08-18 DIAGNOSIS — I4891 Unspecified atrial fibrillation: Secondary | ICD-10-CM | POA: Diagnosis not present

## 2017-08-18 DIAGNOSIS — E46 Unspecified protein-calorie malnutrition: Secondary | ICD-10-CM | POA: Diagnosis not present

## 2017-08-18 DIAGNOSIS — R569 Unspecified convulsions: Secondary | ICD-10-CM | POA: Diagnosis not present

## 2017-08-18 DIAGNOSIS — G309 Alzheimer's disease, unspecified: Secondary | ICD-10-CM | POA: Diagnosis not present

## 2017-08-24 DIAGNOSIS — E46 Unspecified protein-calorie malnutrition: Secondary | ICD-10-CM | POA: Diagnosis not present

## 2017-08-24 DIAGNOSIS — I679 Cerebrovascular disease, unspecified: Secondary | ICD-10-CM | POA: Diagnosis not present

## 2017-08-24 DIAGNOSIS — G309 Alzheimer's disease, unspecified: Secondary | ICD-10-CM | POA: Diagnosis not present

## 2017-08-24 DIAGNOSIS — R1310 Dysphagia, unspecified: Secondary | ICD-10-CM | POA: Diagnosis not present

## 2017-08-24 DIAGNOSIS — R569 Unspecified convulsions: Secondary | ICD-10-CM | POA: Diagnosis not present

## 2017-08-24 DIAGNOSIS — I4891 Unspecified atrial fibrillation: Secondary | ICD-10-CM | POA: Diagnosis not present

## 2017-08-25 DIAGNOSIS — R1310 Dysphagia, unspecified: Secondary | ICD-10-CM | POA: Diagnosis not present

## 2017-08-25 DIAGNOSIS — E46 Unspecified protein-calorie malnutrition: Secondary | ICD-10-CM | POA: Diagnosis not present

## 2017-08-25 DIAGNOSIS — I4891 Unspecified atrial fibrillation: Secondary | ICD-10-CM | POA: Diagnosis not present

## 2017-08-25 DIAGNOSIS — G309 Alzheimer's disease, unspecified: Secondary | ICD-10-CM | POA: Diagnosis not present

## 2017-08-25 DIAGNOSIS — I679 Cerebrovascular disease, unspecified: Secondary | ICD-10-CM | POA: Diagnosis not present

## 2017-08-25 DIAGNOSIS — R569 Unspecified convulsions: Secondary | ICD-10-CM | POA: Diagnosis not present

## 2017-08-27 DIAGNOSIS — R1310 Dysphagia, unspecified: Secondary | ICD-10-CM | POA: Diagnosis not present

## 2017-08-27 DIAGNOSIS — R569 Unspecified convulsions: Secondary | ICD-10-CM | POA: Diagnosis not present

## 2017-08-27 DIAGNOSIS — J301 Allergic rhinitis due to pollen: Secondary | ICD-10-CM | POA: Diagnosis not present

## 2017-08-27 DIAGNOSIS — I10 Essential (primary) hypertension: Secondary | ICD-10-CM | POA: Diagnosis not present

## 2017-08-27 DIAGNOSIS — E785 Hyperlipidemia, unspecified: Secondary | ICD-10-CM | POA: Diagnosis not present

## 2017-08-27 DIAGNOSIS — G309 Alzheimer's disease, unspecified: Secondary | ICD-10-CM | POA: Diagnosis not present

## 2017-08-27 DIAGNOSIS — E46 Unspecified protein-calorie malnutrition: Secondary | ICD-10-CM | POA: Diagnosis not present

## 2017-08-27 DIAGNOSIS — I4891 Unspecified atrial fibrillation: Secondary | ICD-10-CM | POA: Diagnosis not present

## 2017-08-27 DIAGNOSIS — I679 Cerebrovascular disease, unspecified: Secondary | ICD-10-CM | POA: Diagnosis not present

## 2017-08-29 DIAGNOSIS — R569 Unspecified convulsions: Secondary | ICD-10-CM | POA: Diagnosis not present

## 2017-08-29 DIAGNOSIS — E46 Unspecified protein-calorie malnutrition: Secondary | ICD-10-CM | POA: Diagnosis not present

## 2017-08-29 DIAGNOSIS — I679 Cerebrovascular disease, unspecified: Secondary | ICD-10-CM | POA: Diagnosis not present

## 2017-08-29 DIAGNOSIS — R1310 Dysphagia, unspecified: Secondary | ICD-10-CM | POA: Diagnosis not present

## 2017-08-29 DIAGNOSIS — I4891 Unspecified atrial fibrillation: Secondary | ICD-10-CM | POA: Diagnosis not present

## 2017-08-29 DIAGNOSIS — G309 Alzheimer's disease, unspecified: Secondary | ICD-10-CM | POA: Diagnosis not present

## 2017-08-31 DIAGNOSIS — R1310 Dysphagia, unspecified: Secondary | ICD-10-CM | POA: Diagnosis not present

## 2017-08-31 DIAGNOSIS — I4891 Unspecified atrial fibrillation: Secondary | ICD-10-CM | POA: Diagnosis not present

## 2017-08-31 DIAGNOSIS — G309 Alzheimer's disease, unspecified: Secondary | ICD-10-CM | POA: Diagnosis not present

## 2017-08-31 DIAGNOSIS — R569 Unspecified convulsions: Secondary | ICD-10-CM | POA: Diagnosis not present

## 2017-08-31 DIAGNOSIS — E46 Unspecified protein-calorie malnutrition: Secondary | ICD-10-CM | POA: Diagnosis not present

## 2017-08-31 DIAGNOSIS — I679 Cerebrovascular disease, unspecified: Secondary | ICD-10-CM | POA: Diagnosis not present

## 2017-09-02 DIAGNOSIS — E46 Unspecified protein-calorie malnutrition: Secondary | ICD-10-CM | POA: Diagnosis not present

## 2017-09-02 DIAGNOSIS — G309 Alzheimer's disease, unspecified: Secondary | ICD-10-CM | POA: Diagnosis not present

## 2017-09-02 DIAGNOSIS — R569 Unspecified convulsions: Secondary | ICD-10-CM | POA: Diagnosis not present

## 2017-09-02 DIAGNOSIS — I4891 Unspecified atrial fibrillation: Secondary | ICD-10-CM | POA: Diagnosis not present

## 2017-09-02 DIAGNOSIS — R1310 Dysphagia, unspecified: Secondary | ICD-10-CM | POA: Diagnosis not present

## 2017-09-02 DIAGNOSIS — I679 Cerebrovascular disease, unspecified: Secondary | ICD-10-CM | POA: Diagnosis not present

## 2017-09-05 DIAGNOSIS — I679 Cerebrovascular disease, unspecified: Secondary | ICD-10-CM | POA: Diagnosis not present

## 2017-09-05 DIAGNOSIS — G309 Alzheimer's disease, unspecified: Secondary | ICD-10-CM | POA: Diagnosis not present

## 2017-09-05 DIAGNOSIS — I4891 Unspecified atrial fibrillation: Secondary | ICD-10-CM | POA: Diagnosis not present

## 2017-09-05 DIAGNOSIS — E46 Unspecified protein-calorie malnutrition: Secondary | ICD-10-CM | POA: Diagnosis not present

## 2017-09-05 DIAGNOSIS — R1310 Dysphagia, unspecified: Secondary | ICD-10-CM | POA: Diagnosis not present

## 2017-09-05 DIAGNOSIS — R569 Unspecified convulsions: Secondary | ICD-10-CM | POA: Diagnosis not present

## 2017-09-07 DIAGNOSIS — R569 Unspecified convulsions: Secondary | ICD-10-CM | POA: Diagnosis not present

## 2017-09-07 DIAGNOSIS — I4891 Unspecified atrial fibrillation: Secondary | ICD-10-CM | POA: Diagnosis not present

## 2017-09-07 DIAGNOSIS — I679 Cerebrovascular disease, unspecified: Secondary | ICD-10-CM | POA: Diagnosis not present

## 2017-09-07 DIAGNOSIS — R1310 Dysphagia, unspecified: Secondary | ICD-10-CM | POA: Diagnosis not present

## 2017-09-07 DIAGNOSIS — G309 Alzheimer's disease, unspecified: Secondary | ICD-10-CM | POA: Diagnosis not present

## 2017-09-07 DIAGNOSIS — E46 Unspecified protein-calorie malnutrition: Secondary | ICD-10-CM | POA: Diagnosis not present

## 2017-09-08 DIAGNOSIS — I679 Cerebrovascular disease, unspecified: Secondary | ICD-10-CM | POA: Diagnosis not present

## 2017-09-08 DIAGNOSIS — G309 Alzheimer's disease, unspecified: Secondary | ICD-10-CM | POA: Diagnosis not present

## 2017-09-08 DIAGNOSIS — E46 Unspecified protein-calorie malnutrition: Secondary | ICD-10-CM | POA: Diagnosis not present

## 2017-09-08 DIAGNOSIS — R569 Unspecified convulsions: Secondary | ICD-10-CM | POA: Diagnosis not present

## 2017-09-08 DIAGNOSIS — I4891 Unspecified atrial fibrillation: Secondary | ICD-10-CM | POA: Diagnosis not present

## 2017-09-08 DIAGNOSIS — R1310 Dysphagia, unspecified: Secondary | ICD-10-CM | POA: Diagnosis not present

## 2017-09-12 DIAGNOSIS — R1310 Dysphagia, unspecified: Secondary | ICD-10-CM | POA: Diagnosis not present

## 2017-09-12 DIAGNOSIS — E46 Unspecified protein-calorie malnutrition: Secondary | ICD-10-CM | POA: Diagnosis not present

## 2017-09-12 DIAGNOSIS — G309 Alzheimer's disease, unspecified: Secondary | ICD-10-CM | POA: Diagnosis not present

## 2017-09-12 DIAGNOSIS — R569 Unspecified convulsions: Secondary | ICD-10-CM | POA: Diagnosis not present

## 2017-09-12 DIAGNOSIS — I4891 Unspecified atrial fibrillation: Secondary | ICD-10-CM | POA: Diagnosis not present

## 2017-09-12 DIAGNOSIS — I679 Cerebrovascular disease, unspecified: Secondary | ICD-10-CM | POA: Diagnosis not present

## 2017-09-13 DIAGNOSIS — G309 Alzheimer's disease, unspecified: Secondary | ICD-10-CM | POA: Diagnosis not present

## 2017-09-13 DIAGNOSIS — E46 Unspecified protein-calorie malnutrition: Secondary | ICD-10-CM | POA: Diagnosis not present

## 2017-09-13 DIAGNOSIS — R1310 Dysphagia, unspecified: Secondary | ICD-10-CM | POA: Diagnosis not present

## 2017-09-13 DIAGNOSIS — R569 Unspecified convulsions: Secondary | ICD-10-CM | POA: Diagnosis not present

## 2017-09-13 DIAGNOSIS — I4891 Unspecified atrial fibrillation: Secondary | ICD-10-CM | POA: Diagnosis not present

## 2017-09-13 DIAGNOSIS — I679 Cerebrovascular disease, unspecified: Secondary | ICD-10-CM | POA: Diagnosis not present

## 2017-09-14 DIAGNOSIS — G309 Alzheimer's disease, unspecified: Secondary | ICD-10-CM | POA: Diagnosis not present

## 2017-09-14 DIAGNOSIS — I679 Cerebrovascular disease, unspecified: Secondary | ICD-10-CM | POA: Diagnosis not present

## 2017-09-14 DIAGNOSIS — E46 Unspecified protein-calorie malnutrition: Secondary | ICD-10-CM | POA: Diagnosis not present

## 2017-09-14 DIAGNOSIS — R569 Unspecified convulsions: Secondary | ICD-10-CM | POA: Diagnosis not present

## 2017-09-14 DIAGNOSIS — I4891 Unspecified atrial fibrillation: Secondary | ICD-10-CM | POA: Diagnosis not present

## 2017-09-14 DIAGNOSIS — R1310 Dysphagia, unspecified: Secondary | ICD-10-CM | POA: Diagnosis not present

## 2017-09-15 DIAGNOSIS — R569 Unspecified convulsions: Secondary | ICD-10-CM | POA: Diagnosis not present

## 2017-09-15 DIAGNOSIS — R1310 Dysphagia, unspecified: Secondary | ICD-10-CM | POA: Diagnosis not present

## 2017-09-15 DIAGNOSIS — G309 Alzheimer's disease, unspecified: Secondary | ICD-10-CM | POA: Diagnosis not present

## 2017-09-15 DIAGNOSIS — E46 Unspecified protein-calorie malnutrition: Secondary | ICD-10-CM | POA: Diagnosis not present

## 2017-09-15 DIAGNOSIS — I679 Cerebrovascular disease, unspecified: Secondary | ICD-10-CM | POA: Diagnosis not present

## 2017-09-15 DIAGNOSIS — I4891 Unspecified atrial fibrillation: Secondary | ICD-10-CM | POA: Diagnosis not present

## 2017-09-19 DIAGNOSIS — R569 Unspecified convulsions: Secondary | ICD-10-CM | POA: Diagnosis not present

## 2017-09-19 DIAGNOSIS — I679 Cerebrovascular disease, unspecified: Secondary | ICD-10-CM | POA: Diagnosis not present

## 2017-09-19 DIAGNOSIS — I4891 Unspecified atrial fibrillation: Secondary | ICD-10-CM | POA: Diagnosis not present

## 2017-09-19 DIAGNOSIS — E46 Unspecified protein-calorie malnutrition: Secondary | ICD-10-CM | POA: Diagnosis not present

## 2017-09-19 DIAGNOSIS — G309 Alzheimer's disease, unspecified: Secondary | ICD-10-CM | POA: Diagnosis not present

## 2017-09-19 DIAGNOSIS — R1310 Dysphagia, unspecified: Secondary | ICD-10-CM | POA: Diagnosis not present

## 2017-09-20 DIAGNOSIS — I679 Cerebrovascular disease, unspecified: Secondary | ICD-10-CM | POA: Diagnosis not present

## 2017-09-20 DIAGNOSIS — R569 Unspecified convulsions: Secondary | ICD-10-CM | POA: Diagnosis not present

## 2017-09-20 DIAGNOSIS — E46 Unspecified protein-calorie malnutrition: Secondary | ICD-10-CM | POA: Diagnosis not present

## 2017-09-20 DIAGNOSIS — I4891 Unspecified atrial fibrillation: Secondary | ICD-10-CM | POA: Diagnosis not present

## 2017-09-20 DIAGNOSIS — R1310 Dysphagia, unspecified: Secondary | ICD-10-CM | POA: Diagnosis not present

## 2017-09-20 DIAGNOSIS — G309 Alzheimer's disease, unspecified: Secondary | ICD-10-CM | POA: Diagnosis not present

## 2017-09-21 DIAGNOSIS — R569 Unspecified convulsions: Secondary | ICD-10-CM | POA: Diagnosis not present

## 2017-09-21 DIAGNOSIS — G309 Alzheimer's disease, unspecified: Secondary | ICD-10-CM | POA: Diagnosis not present

## 2017-09-21 DIAGNOSIS — E46 Unspecified protein-calorie malnutrition: Secondary | ICD-10-CM | POA: Diagnosis not present

## 2017-09-21 DIAGNOSIS — I4891 Unspecified atrial fibrillation: Secondary | ICD-10-CM | POA: Diagnosis not present

## 2017-09-21 DIAGNOSIS — I679 Cerebrovascular disease, unspecified: Secondary | ICD-10-CM | POA: Diagnosis not present

## 2017-09-21 DIAGNOSIS — R1310 Dysphagia, unspecified: Secondary | ICD-10-CM | POA: Diagnosis not present

## 2017-09-23 DIAGNOSIS — R1310 Dysphagia, unspecified: Secondary | ICD-10-CM | POA: Diagnosis not present

## 2017-09-23 DIAGNOSIS — L603 Nail dystrophy: Secondary | ICD-10-CM | POA: Diagnosis not present

## 2017-09-23 DIAGNOSIS — I4891 Unspecified atrial fibrillation: Secondary | ICD-10-CM | POA: Diagnosis not present

## 2017-09-23 DIAGNOSIS — E46 Unspecified protein-calorie malnutrition: Secondary | ICD-10-CM | POA: Diagnosis not present

## 2017-09-23 DIAGNOSIS — B351 Tinea unguium: Secondary | ICD-10-CM | POA: Diagnosis not present

## 2017-09-23 DIAGNOSIS — I679 Cerebrovascular disease, unspecified: Secondary | ICD-10-CM | POA: Diagnosis not present

## 2017-09-23 DIAGNOSIS — I739 Peripheral vascular disease, unspecified: Secondary | ICD-10-CM | POA: Diagnosis not present

## 2017-09-23 DIAGNOSIS — G309 Alzheimer's disease, unspecified: Secondary | ICD-10-CM | POA: Diagnosis not present

## 2017-09-23 DIAGNOSIS — R569 Unspecified convulsions: Secondary | ICD-10-CM | POA: Diagnosis not present

## 2017-09-26 DIAGNOSIS — E785 Hyperlipidemia, unspecified: Secondary | ICD-10-CM | POA: Diagnosis not present

## 2017-09-26 DIAGNOSIS — G309 Alzheimer's disease, unspecified: Secondary | ICD-10-CM | POA: Diagnosis not present

## 2017-09-26 DIAGNOSIS — E46 Unspecified protein-calorie malnutrition: Secondary | ICD-10-CM | POA: Diagnosis not present

## 2017-09-26 DIAGNOSIS — J301 Allergic rhinitis due to pollen: Secondary | ICD-10-CM | POA: Diagnosis not present

## 2017-09-26 DIAGNOSIS — I4891 Unspecified atrial fibrillation: Secondary | ICD-10-CM | POA: Diagnosis not present

## 2017-09-26 DIAGNOSIS — I679 Cerebrovascular disease, unspecified: Secondary | ICD-10-CM | POA: Diagnosis not present

## 2017-09-26 DIAGNOSIS — I10 Essential (primary) hypertension: Secondary | ICD-10-CM | POA: Diagnosis not present

## 2017-09-26 DIAGNOSIS — R569 Unspecified convulsions: Secondary | ICD-10-CM | POA: Diagnosis not present

## 2017-09-26 DIAGNOSIS — R1310 Dysphagia, unspecified: Secondary | ICD-10-CM | POA: Diagnosis not present

## 2017-09-28 DIAGNOSIS — R1310 Dysphagia, unspecified: Secondary | ICD-10-CM | POA: Diagnosis not present

## 2017-09-28 DIAGNOSIS — G309 Alzheimer's disease, unspecified: Secondary | ICD-10-CM | POA: Diagnosis not present

## 2017-09-28 DIAGNOSIS — I679 Cerebrovascular disease, unspecified: Secondary | ICD-10-CM | POA: Diagnosis not present

## 2017-09-28 DIAGNOSIS — R569 Unspecified convulsions: Secondary | ICD-10-CM | POA: Diagnosis not present

## 2017-09-28 DIAGNOSIS — E46 Unspecified protein-calorie malnutrition: Secondary | ICD-10-CM | POA: Diagnosis not present

## 2017-09-28 DIAGNOSIS — I4891 Unspecified atrial fibrillation: Secondary | ICD-10-CM | POA: Diagnosis not present

## 2017-10-03 DIAGNOSIS — R1310 Dysphagia, unspecified: Secondary | ICD-10-CM | POA: Diagnosis not present

## 2017-10-03 DIAGNOSIS — I4891 Unspecified atrial fibrillation: Secondary | ICD-10-CM | POA: Diagnosis not present

## 2017-10-03 DIAGNOSIS — I679 Cerebrovascular disease, unspecified: Secondary | ICD-10-CM | POA: Diagnosis not present

## 2017-10-03 DIAGNOSIS — E46 Unspecified protein-calorie malnutrition: Secondary | ICD-10-CM | POA: Diagnosis not present

## 2017-10-03 DIAGNOSIS — G309 Alzheimer's disease, unspecified: Secondary | ICD-10-CM | POA: Diagnosis not present

## 2017-10-03 DIAGNOSIS — R569 Unspecified convulsions: Secondary | ICD-10-CM | POA: Diagnosis not present

## 2017-10-04 DIAGNOSIS — R1310 Dysphagia, unspecified: Secondary | ICD-10-CM | POA: Diagnosis not present

## 2017-10-04 DIAGNOSIS — E46 Unspecified protein-calorie malnutrition: Secondary | ICD-10-CM | POA: Diagnosis not present

## 2017-10-04 DIAGNOSIS — R569 Unspecified convulsions: Secondary | ICD-10-CM | POA: Diagnosis not present

## 2017-10-04 DIAGNOSIS — G309 Alzheimer's disease, unspecified: Secondary | ICD-10-CM | POA: Diagnosis not present

## 2017-10-04 DIAGNOSIS — I679 Cerebrovascular disease, unspecified: Secondary | ICD-10-CM | POA: Diagnosis not present

## 2017-10-04 DIAGNOSIS — I4891 Unspecified atrial fibrillation: Secondary | ICD-10-CM | POA: Diagnosis not present

## 2017-10-05 DIAGNOSIS — E46 Unspecified protein-calorie malnutrition: Secondary | ICD-10-CM | POA: Diagnosis not present

## 2017-10-05 DIAGNOSIS — R1310 Dysphagia, unspecified: Secondary | ICD-10-CM | POA: Diagnosis not present

## 2017-10-05 DIAGNOSIS — I679 Cerebrovascular disease, unspecified: Secondary | ICD-10-CM | POA: Diagnosis not present

## 2017-10-05 DIAGNOSIS — I4891 Unspecified atrial fibrillation: Secondary | ICD-10-CM | POA: Diagnosis not present

## 2017-10-05 DIAGNOSIS — G309 Alzheimer's disease, unspecified: Secondary | ICD-10-CM | POA: Diagnosis not present

## 2017-10-05 DIAGNOSIS — R569 Unspecified convulsions: Secondary | ICD-10-CM | POA: Diagnosis not present

## 2017-10-07 DIAGNOSIS — L600 Ingrowing nail: Secondary | ICD-10-CM | POA: Diagnosis not present

## 2017-10-07 DIAGNOSIS — M79674 Pain in right toe(s): Secondary | ICD-10-CM | POA: Diagnosis not present

## 2017-10-10 DIAGNOSIS — G309 Alzheimer's disease, unspecified: Secondary | ICD-10-CM | POA: Diagnosis not present

## 2017-10-10 DIAGNOSIS — I679 Cerebrovascular disease, unspecified: Secondary | ICD-10-CM | POA: Diagnosis not present

## 2017-10-10 DIAGNOSIS — R1310 Dysphagia, unspecified: Secondary | ICD-10-CM | POA: Diagnosis not present

## 2017-10-10 DIAGNOSIS — R569 Unspecified convulsions: Secondary | ICD-10-CM | POA: Diagnosis not present

## 2017-10-10 DIAGNOSIS — E46 Unspecified protein-calorie malnutrition: Secondary | ICD-10-CM | POA: Diagnosis not present

## 2017-10-10 DIAGNOSIS — I4891 Unspecified atrial fibrillation: Secondary | ICD-10-CM | POA: Diagnosis not present

## 2017-10-12 DIAGNOSIS — I4891 Unspecified atrial fibrillation: Secondary | ICD-10-CM | POA: Diagnosis not present

## 2017-10-12 DIAGNOSIS — R1310 Dysphagia, unspecified: Secondary | ICD-10-CM | POA: Diagnosis not present

## 2017-10-12 DIAGNOSIS — I679 Cerebrovascular disease, unspecified: Secondary | ICD-10-CM | POA: Diagnosis not present

## 2017-10-12 DIAGNOSIS — R569 Unspecified convulsions: Secondary | ICD-10-CM | POA: Diagnosis not present

## 2017-10-12 DIAGNOSIS — G309 Alzheimer's disease, unspecified: Secondary | ICD-10-CM | POA: Diagnosis not present

## 2017-10-12 DIAGNOSIS — E46 Unspecified protein-calorie malnutrition: Secondary | ICD-10-CM | POA: Diagnosis not present

## 2017-10-13 DIAGNOSIS — G309 Alzheimer's disease, unspecified: Secondary | ICD-10-CM | POA: Diagnosis not present

## 2017-10-13 DIAGNOSIS — E46 Unspecified protein-calorie malnutrition: Secondary | ICD-10-CM | POA: Diagnosis not present

## 2017-10-13 DIAGNOSIS — R1310 Dysphagia, unspecified: Secondary | ICD-10-CM | POA: Diagnosis not present

## 2017-10-13 DIAGNOSIS — R569 Unspecified convulsions: Secondary | ICD-10-CM | POA: Diagnosis not present

## 2017-10-13 DIAGNOSIS — I4891 Unspecified atrial fibrillation: Secondary | ICD-10-CM | POA: Diagnosis not present

## 2017-10-13 DIAGNOSIS — I679 Cerebrovascular disease, unspecified: Secondary | ICD-10-CM | POA: Diagnosis not present

## 2017-10-14 DIAGNOSIS — E46 Unspecified protein-calorie malnutrition: Secondary | ICD-10-CM | POA: Diagnosis not present

## 2017-10-14 DIAGNOSIS — I679 Cerebrovascular disease, unspecified: Secondary | ICD-10-CM | POA: Diagnosis not present

## 2017-10-14 DIAGNOSIS — G309 Alzheimer's disease, unspecified: Secondary | ICD-10-CM | POA: Diagnosis not present

## 2017-10-14 DIAGNOSIS — R569 Unspecified convulsions: Secondary | ICD-10-CM | POA: Diagnosis not present

## 2017-10-14 DIAGNOSIS — R1310 Dysphagia, unspecified: Secondary | ICD-10-CM | POA: Diagnosis not present

## 2017-10-14 DIAGNOSIS — I4891 Unspecified atrial fibrillation: Secondary | ICD-10-CM | POA: Diagnosis not present

## 2017-10-17 DIAGNOSIS — E46 Unspecified protein-calorie malnutrition: Secondary | ICD-10-CM | POA: Diagnosis not present

## 2017-10-17 DIAGNOSIS — I679 Cerebrovascular disease, unspecified: Secondary | ICD-10-CM | POA: Diagnosis not present

## 2017-10-17 DIAGNOSIS — G309 Alzheimer's disease, unspecified: Secondary | ICD-10-CM | POA: Diagnosis not present

## 2017-10-17 DIAGNOSIS — R1310 Dysphagia, unspecified: Secondary | ICD-10-CM | POA: Diagnosis not present

## 2017-10-17 DIAGNOSIS — I4891 Unspecified atrial fibrillation: Secondary | ICD-10-CM | POA: Diagnosis not present

## 2017-10-17 DIAGNOSIS — R569 Unspecified convulsions: Secondary | ICD-10-CM | POA: Diagnosis not present

## 2017-10-19 DIAGNOSIS — R1310 Dysphagia, unspecified: Secondary | ICD-10-CM | POA: Diagnosis not present

## 2017-10-19 DIAGNOSIS — R569 Unspecified convulsions: Secondary | ICD-10-CM | POA: Diagnosis not present

## 2017-10-19 DIAGNOSIS — I4891 Unspecified atrial fibrillation: Secondary | ICD-10-CM | POA: Diagnosis not present

## 2017-10-19 DIAGNOSIS — G309 Alzheimer's disease, unspecified: Secondary | ICD-10-CM | POA: Diagnosis not present

## 2017-10-19 DIAGNOSIS — E46 Unspecified protein-calorie malnutrition: Secondary | ICD-10-CM | POA: Diagnosis not present

## 2017-10-19 DIAGNOSIS — I679 Cerebrovascular disease, unspecified: Secondary | ICD-10-CM | POA: Diagnosis not present

## 2017-10-20 DIAGNOSIS — I679 Cerebrovascular disease, unspecified: Secondary | ICD-10-CM | POA: Diagnosis not present

## 2017-10-20 DIAGNOSIS — R1310 Dysphagia, unspecified: Secondary | ICD-10-CM | POA: Diagnosis not present

## 2017-10-20 DIAGNOSIS — E46 Unspecified protein-calorie malnutrition: Secondary | ICD-10-CM | POA: Diagnosis not present

## 2017-10-20 DIAGNOSIS — I4891 Unspecified atrial fibrillation: Secondary | ICD-10-CM | POA: Diagnosis not present

## 2017-10-20 DIAGNOSIS — G309 Alzheimer's disease, unspecified: Secondary | ICD-10-CM | POA: Diagnosis not present

## 2017-10-20 DIAGNOSIS — R569 Unspecified convulsions: Secondary | ICD-10-CM | POA: Diagnosis not present

## 2017-10-24 DIAGNOSIS — I4891 Unspecified atrial fibrillation: Secondary | ICD-10-CM | POA: Diagnosis not present

## 2017-10-24 DIAGNOSIS — R1310 Dysphagia, unspecified: Secondary | ICD-10-CM | POA: Diagnosis not present

## 2017-10-24 DIAGNOSIS — R569 Unspecified convulsions: Secondary | ICD-10-CM | POA: Diagnosis not present

## 2017-10-24 DIAGNOSIS — G309 Alzheimer's disease, unspecified: Secondary | ICD-10-CM | POA: Diagnosis not present

## 2017-10-24 DIAGNOSIS — I679 Cerebrovascular disease, unspecified: Secondary | ICD-10-CM | POA: Diagnosis not present

## 2017-10-24 DIAGNOSIS — E46 Unspecified protein-calorie malnutrition: Secondary | ICD-10-CM | POA: Diagnosis not present

## 2017-10-26 DIAGNOSIS — I679 Cerebrovascular disease, unspecified: Secondary | ICD-10-CM | POA: Diagnosis not present

## 2017-10-26 DIAGNOSIS — Z515 Encounter for palliative care: Secondary | ICD-10-CM | POA: Diagnosis not present

## 2017-10-26 DIAGNOSIS — R1310 Dysphagia, unspecified: Secondary | ICD-10-CM | POA: Diagnosis not present

## 2017-10-26 DIAGNOSIS — R05 Cough: Secondary | ICD-10-CM | POA: Diagnosis not present

## 2017-10-26 DIAGNOSIS — G309 Alzheimer's disease, unspecified: Secondary | ICD-10-CM | POA: Diagnosis not present

## 2017-10-26 DIAGNOSIS — E46 Unspecified protein-calorie malnutrition: Secondary | ICD-10-CM | POA: Diagnosis not present

## 2017-10-26 DIAGNOSIS — I4891 Unspecified atrial fibrillation: Secondary | ICD-10-CM | POA: Diagnosis not present

## 2017-10-26 DIAGNOSIS — R569 Unspecified convulsions: Secondary | ICD-10-CM | POA: Diagnosis not present

## 2017-10-26 DIAGNOSIS — F0280 Dementia in other diseases classified elsewhere without behavioral disturbance: Secondary | ICD-10-CM | POA: Diagnosis not present

## 2017-10-27 DIAGNOSIS — J301 Allergic rhinitis due to pollen: Secondary | ICD-10-CM | POA: Diagnosis not present

## 2017-10-27 DIAGNOSIS — G309 Alzheimer's disease, unspecified: Secondary | ICD-10-CM | POA: Diagnosis not present

## 2017-10-27 DIAGNOSIS — I10 Essential (primary) hypertension: Secondary | ICD-10-CM | POA: Diagnosis not present

## 2017-10-27 DIAGNOSIS — R1310 Dysphagia, unspecified: Secondary | ICD-10-CM | POA: Diagnosis not present

## 2017-10-27 DIAGNOSIS — I4891 Unspecified atrial fibrillation: Secondary | ICD-10-CM | POA: Diagnosis not present

## 2017-10-27 DIAGNOSIS — E46 Unspecified protein-calorie malnutrition: Secondary | ICD-10-CM | POA: Diagnosis not present

## 2017-10-27 DIAGNOSIS — E785 Hyperlipidemia, unspecified: Secondary | ICD-10-CM | POA: Diagnosis not present

## 2017-10-27 DIAGNOSIS — R569 Unspecified convulsions: Secondary | ICD-10-CM | POA: Diagnosis not present

## 2017-10-27 DIAGNOSIS — I679 Cerebrovascular disease, unspecified: Secondary | ICD-10-CM | POA: Diagnosis not present

## 2017-10-31 DIAGNOSIS — R569 Unspecified convulsions: Secondary | ICD-10-CM | POA: Diagnosis not present

## 2017-10-31 DIAGNOSIS — R1310 Dysphagia, unspecified: Secondary | ICD-10-CM | POA: Diagnosis not present

## 2017-10-31 DIAGNOSIS — E46 Unspecified protein-calorie malnutrition: Secondary | ICD-10-CM | POA: Diagnosis not present

## 2017-10-31 DIAGNOSIS — I679 Cerebrovascular disease, unspecified: Secondary | ICD-10-CM | POA: Diagnosis not present

## 2017-10-31 DIAGNOSIS — G309 Alzheimer's disease, unspecified: Secondary | ICD-10-CM | POA: Diagnosis not present

## 2017-10-31 DIAGNOSIS — I4891 Unspecified atrial fibrillation: Secondary | ICD-10-CM | POA: Diagnosis not present

## 2017-11-01 DIAGNOSIS — I679 Cerebrovascular disease, unspecified: Secondary | ICD-10-CM | POA: Diagnosis not present

## 2017-11-01 DIAGNOSIS — E46 Unspecified protein-calorie malnutrition: Secondary | ICD-10-CM | POA: Diagnosis not present

## 2017-11-01 DIAGNOSIS — G309 Alzheimer's disease, unspecified: Secondary | ICD-10-CM | POA: Diagnosis not present

## 2017-11-01 DIAGNOSIS — I4891 Unspecified atrial fibrillation: Secondary | ICD-10-CM | POA: Diagnosis not present

## 2017-11-01 DIAGNOSIS — R569 Unspecified convulsions: Secondary | ICD-10-CM | POA: Diagnosis not present

## 2017-11-01 DIAGNOSIS — R1310 Dysphagia, unspecified: Secondary | ICD-10-CM | POA: Diagnosis not present

## 2017-11-02 DIAGNOSIS — E46 Unspecified protein-calorie malnutrition: Secondary | ICD-10-CM | POA: Diagnosis not present

## 2017-11-02 DIAGNOSIS — G309 Alzheimer's disease, unspecified: Secondary | ICD-10-CM | POA: Diagnosis not present

## 2017-11-02 DIAGNOSIS — I679 Cerebrovascular disease, unspecified: Secondary | ICD-10-CM | POA: Diagnosis not present

## 2017-11-02 DIAGNOSIS — I4891 Unspecified atrial fibrillation: Secondary | ICD-10-CM | POA: Diagnosis not present

## 2017-11-02 DIAGNOSIS — R569 Unspecified convulsions: Secondary | ICD-10-CM | POA: Diagnosis not present

## 2017-11-02 DIAGNOSIS — R1310 Dysphagia, unspecified: Secondary | ICD-10-CM | POA: Diagnosis not present

## 2017-11-07 DIAGNOSIS — R1310 Dysphagia, unspecified: Secondary | ICD-10-CM | POA: Diagnosis not present

## 2017-11-07 DIAGNOSIS — R569 Unspecified convulsions: Secondary | ICD-10-CM | POA: Diagnosis not present

## 2017-11-07 DIAGNOSIS — I4891 Unspecified atrial fibrillation: Secondary | ICD-10-CM | POA: Diagnosis not present

## 2017-11-07 DIAGNOSIS — E46 Unspecified protein-calorie malnutrition: Secondary | ICD-10-CM | POA: Diagnosis not present

## 2017-11-07 DIAGNOSIS — I679 Cerebrovascular disease, unspecified: Secondary | ICD-10-CM | POA: Diagnosis not present

## 2017-11-07 DIAGNOSIS — G309 Alzheimer's disease, unspecified: Secondary | ICD-10-CM | POA: Diagnosis not present

## 2017-11-09 DIAGNOSIS — I4891 Unspecified atrial fibrillation: Secondary | ICD-10-CM | POA: Diagnosis not present

## 2017-11-09 DIAGNOSIS — R569 Unspecified convulsions: Secondary | ICD-10-CM | POA: Diagnosis not present

## 2017-11-09 DIAGNOSIS — E46 Unspecified protein-calorie malnutrition: Secondary | ICD-10-CM | POA: Diagnosis not present

## 2017-11-09 DIAGNOSIS — R1310 Dysphagia, unspecified: Secondary | ICD-10-CM | POA: Diagnosis not present

## 2017-11-09 DIAGNOSIS — G309 Alzheimer's disease, unspecified: Secondary | ICD-10-CM | POA: Diagnosis not present

## 2017-11-09 DIAGNOSIS — I679 Cerebrovascular disease, unspecified: Secondary | ICD-10-CM | POA: Diagnosis not present

## 2017-11-10 DIAGNOSIS — R569 Unspecified convulsions: Secondary | ICD-10-CM | POA: Diagnosis not present

## 2017-11-10 DIAGNOSIS — G309 Alzheimer's disease, unspecified: Secondary | ICD-10-CM | POA: Diagnosis not present

## 2017-11-10 DIAGNOSIS — E46 Unspecified protein-calorie malnutrition: Secondary | ICD-10-CM | POA: Diagnosis not present

## 2017-11-10 DIAGNOSIS — R1310 Dysphagia, unspecified: Secondary | ICD-10-CM | POA: Diagnosis not present

## 2017-11-10 DIAGNOSIS — I4891 Unspecified atrial fibrillation: Secondary | ICD-10-CM | POA: Diagnosis not present

## 2017-11-10 DIAGNOSIS — I679 Cerebrovascular disease, unspecified: Secondary | ICD-10-CM | POA: Diagnosis not present

## 2017-11-14 DIAGNOSIS — R1310 Dysphagia, unspecified: Secondary | ICD-10-CM | POA: Diagnosis not present

## 2017-11-14 DIAGNOSIS — I4891 Unspecified atrial fibrillation: Secondary | ICD-10-CM | POA: Diagnosis not present

## 2017-11-14 DIAGNOSIS — E46 Unspecified protein-calorie malnutrition: Secondary | ICD-10-CM | POA: Diagnosis not present

## 2017-11-14 DIAGNOSIS — R569 Unspecified convulsions: Secondary | ICD-10-CM | POA: Diagnosis not present

## 2017-11-14 DIAGNOSIS — I679 Cerebrovascular disease, unspecified: Secondary | ICD-10-CM | POA: Diagnosis not present

## 2017-11-14 DIAGNOSIS — G309 Alzheimer's disease, unspecified: Secondary | ICD-10-CM | POA: Diagnosis not present

## 2017-11-16 ENCOUNTER — Encounter: Payer: Self-pay | Admitting: Internal Medicine

## 2017-11-16 DIAGNOSIS — G309 Alzheimer's disease, unspecified: Secondary | ICD-10-CM | POA: Diagnosis not present

## 2017-11-16 DIAGNOSIS — R1310 Dysphagia, unspecified: Secondary | ICD-10-CM | POA: Diagnosis not present

## 2017-11-16 DIAGNOSIS — I679 Cerebrovascular disease, unspecified: Secondary | ICD-10-CM | POA: Diagnosis not present

## 2017-11-16 DIAGNOSIS — I4891 Unspecified atrial fibrillation: Secondary | ICD-10-CM | POA: Diagnosis not present

## 2017-11-16 DIAGNOSIS — R569 Unspecified convulsions: Secondary | ICD-10-CM | POA: Diagnosis not present

## 2017-11-16 DIAGNOSIS — E46 Unspecified protein-calorie malnutrition: Secondary | ICD-10-CM | POA: Diagnosis not present

## 2017-11-18 DIAGNOSIS — E46 Unspecified protein-calorie malnutrition: Secondary | ICD-10-CM | POA: Diagnosis not present

## 2017-11-18 DIAGNOSIS — G309 Alzheimer's disease, unspecified: Secondary | ICD-10-CM | POA: Diagnosis not present

## 2017-11-18 DIAGNOSIS — R569 Unspecified convulsions: Secondary | ICD-10-CM | POA: Diagnosis not present

## 2017-11-18 DIAGNOSIS — I679 Cerebrovascular disease, unspecified: Secondary | ICD-10-CM | POA: Diagnosis not present

## 2017-11-18 DIAGNOSIS — I4891 Unspecified atrial fibrillation: Secondary | ICD-10-CM | POA: Diagnosis not present

## 2017-11-18 DIAGNOSIS — R1310 Dysphagia, unspecified: Secondary | ICD-10-CM | POA: Diagnosis not present

## 2017-11-21 DIAGNOSIS — R1310 Dysphagia, unspecified: Secondary | ICD-10-CM | POA: Diagnosis not present

## 2017-11-21 DIAGNOSIS — I679 Cerebrovascular disease, unspecified: Secondary | ICD-10-CM | POA: Diagnosis not present

## 2017-11-21 DIAGNOSIS — E46 Unspecified protein-calorie malnutrition: Secondary | ICD-10-CM | POA: Diagnosis not present

## 2017-11-21 DIAGNOSIS — G309 Alzheimer's disease, unspecified: Secondary | ICD-10-CM | POA: Diagnosis not present

## 2017-11-21 DIAGNOSIS — I4891 Unspecified atrial fibrillation: Secondary | ICD-10-CM | POA: Diagnosis not present

## 2017-11-21 DIAGNOSIS — R569 Unspecified convulsions: Secondary | ICD-10-CM | POA: Diagnosis not present

## 2017-11-23 DIAGNOSIS — R1310 Dysphagia, unspecified: Secondary | ICD-10-CM | POA: Diagnosis not present

## 2017-11-23 DIAGNOSIS — I679 Cerebrovascular disease, unspecified: Secondary | ICD-10-CM | POA: Diagnosis not present

## 2017-11-23 DIAGNOSIS — I4891 Unspecified atrial fibrillation: Secondary | ICD-10-CM | POA: Diagnosis not present

## 2017-11-23 DIAGNOSIS — E46 Unspecified protein-calorie malnutrition: Secondary | ICD-10-CM | POA: Diagnosis not present

## 2017-11-23 DIAGNOSIS — R569 Unspecified convulsions: Secondary | ICD-10-CM | POA: Diagnosis not present

## 2017-11-23 DIAGNOSIS — G309 Alzheimer's disease, unspecified: Secondary | ICD-10-CM | POA: Diagnosis not present

## 2017-11-25 DIAGNOSIS — R569 Unspecified convulsions: Secondary | ICD-10-CM | POA: Diagnosis not present

## 2017-11-25 DIAGNOSIS — I679 Cerebrovascular disease, unspecified: Secondary | ICD-10-CM | POA: Diagnosis not present

## 2017-11-25 DIAGNOSIS — R1310 Dysphagia, unspecified: Secondary | ICD-10-CM | POA: Diagnosis not present

## 2017-11-25 DIAGNOSIS — E46 Unspecified protein-calorie malnutrition: Secondary | ICD-10-CM | POA: Diagnosis not present

## 2017-11-25 DIAGNOSIS — I4891 Unspecified atrial fibrillation: Secondary | ICD-10-CM | POA: Diagnosis not present

## 2017-11-25 DIAGNOSIS — G309 Alzheimer's disease, unspecified: Secondary | ICD-10-CM | POA: Diagnosis not present

## 2017-11-27 DIAGNOSIS — R1310 Dysphagia, unspecified: Secondary | ICD-10-CM | POA: Diagnosis not present

## 2017-11-27 DIAGNOSIS — I679 Cerebrovascular disease, unspecified: Secondary | ICD-10-CM | POA: Diagnosis not present

## 2017-11-27 DIAGNOSIS — E46 Unspecified protein-calorie malnutrition: Secondary | ICD-10-CM | POA: Diagnosis not present

## 2017-11-27 DIAGNOSIS — G309 Alzheimer's disease, unspecified: Secondary | ICD-10-CM | POA: Diagnosis not present

## 2017-11-27 DIAGNOSIS — R569 Unspecified convulsions: Secondary | ICD-10-CM | POA: Diagnosis not present

## 2017-11-27 DIAGNOSIS — I10 Essential (primary) hypertension: Secondary | ICD-10-CM | POA: Diagnosis not present

## 2017-11-27 DIAGNOSIS — J301 Allergic rhinitis due to pollen: Secondary | ICD-10-CM | POA: Diagnosis not present

## 2017-11-27 DIAGNOSIS — E785 Hyperlipidemia, unspecified: Secondary | ICD-10-CM | POA: Diagnosis not present

## 2017-11-27 DIAGNOSIS — I4891 Unspecified atrial fibrillation: Secondary | ICD-10-CM | POA: Diagnosis not present

## 2017-11-28 DIAGNOSIS — R569 Unspecified convulsions: Secondary | ICD-10-CM | POA: Diagnosis not present

## 2017-11-28 DIAGNOSIS — I4891 Unspecified atrial fibrillation: Secondary | ICD-10-CM | POA: Diagnosis not present

## 2017-11-28 DIAGNOSIS — I679 Cerebrovascular disease, unspecified: Secondary | ICD-10-CM | POA: Diagnosis not present

## 2017-11-28 DIAGNOSIS — E46 Unspecified protein-calorie malnutrition: Secondary | ICD-10-CM | POA: Diagnosis not present

## 2017-11-28 DIAGNOSIS — G309 Alzheimer's disease, unspecified: Secondary | ICD-10-CM | POA: Diagnosis not present

## 2017-11-28 DIAGNOSIS — R1310 Dysphagia, unspecified: Secondary | ICD-10-CM | POA: Diagnosis not present

## 2017-11-30 DIAGNOSIS — R569 Unspecified convulsions: Secondary | ICD-10-CM | POA: Diagnosis not present

## 2017-11-30 DIAGNOSIS — R1310 Dysphagia, unspecified: Secondary | ICD-10-CM | POA: Diagnosis not present

## 2017-11-30 DIAGNOSIS — G309 Alzheimer's disease, unspecified: Secondary | ICD-10-CM | POA: Diagnosis not present

## 2017-11-30 DIAGNOSIS — I4891 Unspecified atrial fibrillation: Secondary | ICD-10-CM | POA: Diagnosis not present

## 2017-11-30 DIAGNOSIS — E46 Unspecified protein-calorie malnutrition: Secondary | ICD-10-CM | POA: Diagnosis not present

## 2017-11-30 DIAGNOSIS — I679 Cerebrovascular disease, unspecified: Secondary | ICD-10-CM | POA: Diagnosis not present

## 2017-12-02 DIAGNOSIS — R569 Unspecified convulsions: Secondary | ICD-10-CM | POA: Diagnosis not present

## 2017-12-02 DIAGNOSIS — B351 Tinea unguium: Secondary | ICD-10-CM | POA: Diagnosis not present

## 2017-12-02 DIAGNOSIS — E46 Unspecified protein-calorie malnutrition: Secondary | ICD-10-CM | POA: Diagnosis not present

## 2017-12-02 DIAGNOSIS — R1310 Dysphagia, unspecified: Secondary | ICD-10-CM | POA: Diagnosis not present

## 2017-12-02 DIAGNOSIS — G309 Alzheimer's disease, unspecified: Secondary | ICD-10-CM | POA: Diagnosis not present

## 2017-12-02 DIAGNOSIS — I739 Peripheral vascular disease, unspecified: Secondary | ICD-10-CM | POA: Diagnosis not present

## 2017-12-02 DIAGNOSIS — I679 Cerebrovascular disease, unspecified: Secondary | ICD-10-CM | POA: Diagnosis not present

## 2017-12-02 DIAGNOSIS — I4891 Unspecified atrial fibrillation: Secondary | ICD-10-CM | POA: Diagnosis not present

## 2017-12-05 DIAGNOSIS — G309 Alzheimer's disease, unspecified: Secondary | ICD-10-CM | POA: Diagnosis not present

## 2017-12-05 DIAGNOSIS — R569 Unspecified convulsions: Secondary | ICD-10-CM | POA: Diagnosis not present

## 2017-12-05 DIAGNOSIS — I679 Cerebrovascular disease, unspecified: Secondary | ICD-10-CM | POA: Diagnosis not present

## 2017-12-05 DIAGNOSIS — E46 Unspecified protein-calorie malnutrition: Secondary | ICD-10-CM | POA: Diagnosis not present

## 2017-12-05 DIAGNOSIS — I4891 Unspecified atrial fibrillation: Secondary | ICD-10-CM | POA: Diagnosis not present

## 2017-12-05 DIAGNOSIS — R1310 Dysphagia, unspecified: Secondary | ICD-10-CM | POA: Diagnosis not present

## 2017-12-07 DIAGNOSIS — R569 Unspecified convulsions: Secondary | ICD-10-CM | POA: Diagnosis not present

## 2017-12-07 DIAGNOSIS — I679 Cerebrovascular disease, unspecified: Secondary | ICD-10-CM | POA: Diagnosis not present

## 2017-12-07 DIAGNOSIS — R1310 Dysphagia, unspecified: Secondary | ICD-10-CM | POA: Diagnosis not present

## 2017-12-07 DIAGNOSIS — G309 Alzheimer's disease, unspecified: Secondary | ICD-10-CM | POA: Diagnosis not present

## 2017-12-07 DIAGNOSIS — E46 Unspecified protein-calorie malnutrition: Secondary | ICD-10-CM | POA: Diagnosis not present

## 2017-12-07 DIAGNOSIS — I4891 Unspecified atrial fibrillation: Secondary | ICD-10-CM | POA: Diagnosis not present

## 2017-12-09 DIAGNOSIS — I679 Cerebrovascular disease, unspecified: Secondary | ICD-10-CM | POA: Diagnosis not present

## 2017-12-09 DIAGNOSIS — I4891 Unspecified atrial fibrillation: Secondary | ICD-10-CM | POA: Diagnosis not present

## 2017-12-09 DIAGNOSIS — G309 Alzheimer's disease, unspecified: Secondary | ICD-10-CM | POA: Diagnosis not present

## 2017-12-09 DIAGNOSIS — E46 Unspecified protein-calorie malnutrition: Secondary | ICD-10-CM | POA: Diagnosis not present

## 2017-12-09 DIAGNOSIS — R569 Unspecified convulsions: Secondary | ICD-10-CM | POA: Diagnosis not present

## 2017-12-09 DIAGNOSIS — R1310 Dysphagia, unspecified: Secondary | ICD-10-CM | POA: Diagnosis not present

## 2017-12-12 DIAGNOSIS — G309 Alzheimer's disease, unspecified: Secondary | ICD-10-CM | POA: Diagnosis not present

## 2017-12-12 DIAGNOSIS — R569 Unspecified convulsions: Secondary | ICD-10-CM | POA: Diagnosis not present

## 2017-12-12 DIAGNOSIS — I679 Cerebrovascular disease, unspecified: Secondary | ICD-10-CM | POA: Diagnosis not present

## 2017-12-12 DIAGNOSIS — I4891 Unspecified atrial fibrillation: Secondary | ICD-10-CM | POA: Diagnosis not present

## 2017-12-12 DIAGNOSIS — E46 Unspecified protein-calorie malnutrition: Secondary | ICD-10-CM | POA: Diagnosis not present

## 2017-12-12 DIAGNOSIS — R1310 Dysphagia, unspecified: Secondary | ICD-10-CM | POA: Diagnosis not present

## 2017-12-14 DIAGNOSIS — R1310 Dysphagia, unspecified: Secondary | ICD-10-CM | POA: Diagnosis not present

## 2017-12-14 DIAGNOSIS — R569 Unspecified convulsions: Secondary | ICD-10-CM | POA: Diagnosis not present

## 2017-12-14 DIAGNOSIS — E46 Unspecified protein-calorie malnutrition: Secondary | ICD-10-CM | POA: Diagnosis not present

## 2017-12-14 DIAGNOSIS — I4891 Unspecified atrial fibrillation: Secondary | ICD-10-CM | POA: Diagnosis not present

## 2017-12-14 DIAGNOSIS — I679 Cerebrovascular disease, unspecified: Secondary | ICD-10-CM | POA: Diagnosis not present

## 2017-12-14 DIAGNOSIS — G309 Alzheimer's disease, unspecified: Secondary | ICD-10-CM | POA: Diagnosis not present

## 2017-12-16 DIAGNOSIS — I4891 Unspecified atrial fibrillation: Secondary | ICD-10-CM | POA: Diagnosis not present

## 2017-12-16 DIAGNOSIS — R1310 Dysphagia, unspecified: Secondary | ICD-10-CM | POA: Diagnosis not present

## 2017-12-16 DIAGNOSIS — R569 Unspecified convulsions: Secondary | ICD-10-CM | POA: Diagnosis not present

## 2017-12-16 DIAGNOSIS — I679 Cerebrovascular disease, unspecified: Secondary | ICD-10-CM | POA: Diagnosis not present

## 2017-12-16 DIAGNOSIS — E46 Unspecified protein-calorie malnutrition: Secondary | ICD-10-CM | POA: Diagnosis not present

## 2017-12-16 DIAGNOSIS — G309 Alzheimer's disease, unspecified: Secondary | ICD-10-CM | POA: Diagnosis not present

## 2017-12-19 DIAGNOSIS — I679 Cerebrovascular disease, unspecified: Secondary | ICD-10-CM | POA: Diagnosis not present

## 2017-12-19 DIAGNOSIS — R1310 Dysphagia, unspecified: Secondary | ICD-10-CM | POA: Diagnosis not present

## 2017-12-19 DIAGNOSIS — I4891 Unspecified atrial fibrillation: Secondary | ICD-10-CM | POA: Diagnosis not present

## 2017-12-19 DIAGNOSIS — G309 Alzheimer's disease, unspecified: Secondary | ICD-10-CM | POA: Diagnosis not present

## 2017-12-19 DIAGNOSIS — R569 Unspecified convulsions: Secondary | ICD-10-CM | POA: Diagnosis not present

## 2017-12-19 DIAGNOSIS — E46 Unspecified protein-calorie malnutrition: Secondary | ICD-10-CM | POA: Diagnosis not present

## 2017-12-20 DIAGNOSIS — R1310 Dysphagia, unspecified: Secondary | ICD-10-CM | POA: Diagnosis not present

## 2017-12-20 DIAGNOSIS — I4891 Unspecified atrial fibrillation: Secondary | ICD-10-CM | POA: Diagnosis not present

## 2017-12-20 DIAGNOSIS — E46 Unspecified protein-calorie malnutrition: Secondary | ICD-10-CM | POA: Diagnosis not present

## 2017-12-20 DIAGNOSIS — R569 Unspecified convulsions: Secondary | ICD-10-CM | POA: Diagnosis not present

## 2017-12-20 DIAGNOSIS — I679 Cerebrovascular disease, unspecified: Secondary | ICD-10-CM | POA: Diagnosis not present

## 2017-12-20 DIAGNOSIS — G309 Alzheimer's disease, unspecified: Secondary | ICD-10-CM | POA: Diagnosis not present

## 2017-12-21 DIAGNOSIS — R569 Unspecified convulsions: Secondary | ICD-10-CM | POA: Diagnosis not present

## 2017-12-21 DIAGNOSIS — I679 Cerebrovascular disease, unspecified: Secondary | ICD-10-CM | POA: Diagnosis not present

## 2017-12-21 DIAGNOSIS — R1310 Dysphagia, unspecified: Secondary | ICD-10-CM | POA: Diagnosis not present

## 2017-12-21 DIAGNOSIS — I4891 Unspecified atrial fibrillation: Secondary | ICD-10-CM | POA: Diagnosis not present

## 2017-12-21 DIAGNOSIS — G309 Alzheimer's disease, unspecified: Secondary | ICD-10-CM | POA: Diagnosis not present

## 2017-12-21 DIAGNOSIS — E46 Unspecified protein-calorie malnutrition: Secondary | ICD-10-CM | POA: Diagnosis not present

## 2017-12-23 DIAGNOSIS — R1310 Dysphagia, unspecified: Secondary | ICD-10-CM | POA: Diagnosis not present

## 2017-12-23 DIAGNOSIS — I4891 Unspecified atrial fibrillation: Secondary | ICD-10-CM | POA: Diagnosis not present

## 2017-12-23 DIAGNOSIS — G309 Alzheimer's disease, unspecified: Secondary | ICD-10-CM | POA: Diagnosis not present

## 2017-12-23 DIAGNOSIS — R569 Unspecified convulsions: Secondary | ICD-10-CM | POA: Diagnosis not present

## 2017-12-23 DIAGNOSIS — E46 Unspecified protein-calorie malnutrition: Secondary | ICD-10-CM | POA: Diagnosis not present

## 2017-12-23 DIAGNOSIS — I679 Cerebrovascular disease, unspecified: Secondary | ICD-10-CM | POA: Diagnosis not present

## 2017-12-26 DIAGNOSIS — R569 Unspecified convulsions: Secondary | ICD-10-CM | POA: Diagnosis not present

## 2017-12-26 DIAGNOSIS — I4891 Unspecified atrial fibrillation: Secondary | ICD-10-CM | POA: Diagnosis not present

## 2017-12-26 DIAGNOSIS — I679 Cerebrovascular disease, unspecified: Secondary | ICD-10-CM | POA: Diagnosis not present

## 2017-12-26 DIAGNOSIS — E46 Unspecified protein-calorie malnutrition: Secondary | ICD-10-CM | POA: Diagnosis not present

## 2017-12-26 DIAGNOSIS — G309 Alzheimer's disease, unspecified: Secondary | ICD-10-CM | POA: Diagnosis not present

## 2017-12-26 DIAGNOSIS — R1310 Dysphagia, unspecified: Secondary | ICD-10-CM | POA: Diagnosis not present

## 2017-12-27 DIAGNOSIS — R569 Unspecified convulsions: Secondary | ICD-10-CM | POA: Diagnosis not present

## 2017-12-27 DIAGNOSIS — J301 Allergic rhinitis due to pollen: Secondary | ICD-10-CM | POA: Diagnosis not present

## 2017-12-27 DIAGNOSIS — E46 Unspecified protein-calorie malnutrition: Secondary | ICD-10-CM | POA: Diagnosis not present

## 2017-12-27 DIAGNOSIS — I10 Essential (primary) hypertension: Secondary | ICD-10-CM | POA: Diagnosis not present

## 2017-12-27 DIAGNOSIS — R1310 Dysphagia, unspecified: Secondary | ICD-10-CM | POA: Diagnosis not present

## 2017-12-27 DIAGNOSIS — I4891 Unspecified atrial fibrillation: Secondary | ICD-10-CM | POA: Diagnosis not present

## 2017-12-27 DIAGNOSIS — I679 Cerebrovascular disease, unspecified: Secondary | ICD-10-CM | POA: Diagnosis not present

## 2017-12-27 DIAGNOSIS — E785 Hyperlipidemia, unspecified: Secondary | ICD-10-CM | POA: Diagnosis not present

## 2017-12-27 DIAGNOSIS — G309 Alzheimer's disease, unspecified: Secondary | ICD-10-CM | POA: Diagnosis not present

## 2017-12-28 DIAGNOSIS — R569 Unspecified convulsions: Secondary | ICD-10-CM | POA: Diagnosis not present

## 2017-12-28 DIAGNOSIS — I4891 Unspecified atrial fibrillation: Secondary | ICD-10-CM | POA: Diagnosis not present

## 2017-12-28 DIAGNOSIS — I679 Cerebrovascular disease, unspecified: Secondary | ICD-10-CM | POA: Diagnosis not present

## 2017-12-28 DIAGNOSIS — R1310 Dysphagia, unspecified: Secondary | ICD-10-CM | POA: Diagnosis not present

## 2017-12-28 DIAGNOSIS — G309 Alzheimer's disease, unspecified: Secondary | ICD-10-CM | POA: Diagnosis not present

## 2017-12-28 DIAGNOSIS — E46 Unspecified protein-calorie malnutrition: Secondary | ICD-10-CM | POA: Diagnosis not present

## 2017-12-30 DIAGNOSIS — R569 Unspecified convulsions: Secondary | ICD-10-CM | POA: Diagnosis not present

## 2017-12-30 DIAGNOSIS — E46 Unspecified protein-calorie malnutrition: Secondary | ICD-10-CM | POA: Diagnosis not present

## 2017-12-30 DIAGNOSIS — I4891 Unspecified atrial fibrillation: Secondary | ICD-10-CM | POA: Diagnosis not present

## 2017-12-30 DIAGNOSIS — I679 Cerebrovascular disease, unspecified: Secondary | ICD-10-CM | POA: Diagnosis not present

## 2017-12-30 DIAGNOSIS — R1310 Dysphagia, unspecified: Secondary | ICD-10-CM | POA: Diagnosis not present

## 2017-12-30 DIAGNOSIS — G309 Alzheimer's disease, unspecified: Secondary | ICD-10-CM | POA: Diagnosis not present

## 2018-01-02 DIAGNOSIS — I679 Cerebrovascular disease, unspecified: Secondary | ICD-10-CM | POA: Diagnosis not present

## 2018-01-02 DIAGNOSIS — I4891 Unspecified atrial fibrillation: Secondary | ICD-10-CM | POA: Diagnosis not present

## 2018-01-02 DIAGNOSIS — E46 Unspecified protein-calorie malnutrition: Secondary | ICD-10-CM | POA: Diagnosis not present

## 2018-01-02 DIAGNOSIS — R1310 Dysphagia, unspecified: Secondary | ICD-10-CM | POA: Diagnosis not present

## 2018-01-02 DIAGNOSIS — R569 Unspecified convulsions: Secondary | ICD-10-CM | POA: Diagnosis not present

## 2018-01-02 DIAGNOSIS — G309 Alzheimer's disease, unspecified: Secondary | ICD-10-CM | POA: Diagnosis not present

## 2018-01-04 DIAGNOSIS — G309 Alzheimer's disease, unspecified: Secondary | ICD-10-CM | POA: Diagnosis not present

## 2018-01-04 DIAGNOSIS — R1310 Dysphagia, unspecified: Secondary | ICD-10-CM | POA: Diagnosis not present

## 2018-01-04 DIAGNOSIS — I679 Cerebrovascular disease, unspecified: Secondary | ICD-10-CM | POA: Diagnosis not present

## 2018-01-04 DIAGNOSIS — E46 Unspecified protein-calorie malnutrition: Secondary | ICD-10-CM | POA: Diagnosis not present

## 2018-01-04 DIAGNOSIS — I4891 Unspecified atrial fibrillation: Secondary | ICD-10-CM | POA: Diagnosis not present

## 2018-01-04 DIAGNOSIS — R569 Unspecified convulsions: Secondary | ICD-10-CM | POA: Diagnosis not present

## 2018-01-06 DIAGNOSIS — G309 Alzheimer's disease, unspecified: Secondary | ICD-10-CM | POA: Diagnosis not present

## 2018-01-06 DIAGNOSIS — I679 Cerebrovascular disease, unspecified: Secondary | ICD-10-CM | POA: Diagnosis not present

## 2018-01-06 DIAGNOSIS — E46 Unspecified protein-calorie malnutrition: Secondary | ICD-10-CM | POA: Diagnosis not present

## 2018-01-06 DIAGNOSIS — I4891 Unspecified atrial fibrillation: Secondary | ICD-10-CM | POA: Diagnosis not present

## 2018-01-06 DIAGNOSIS — R1310 Dysphagia, unspecified: Secondary | ICD-10-CM | POA: Diagnosis not present

## 2018-01-06 DIAGNOSIS — R569 Unspecified convulsions: Secondary | ICD-10-CM | POA: Diagnosis not present

## 2018-01-09 DIAGNOSIS — R569 Unspecified convulsions: Secondary | ICD-10-CM | POA: Diagnosis not present

## 2018-01-09 DIAGNOSIS — R1310 Dysphagia, unspecified: Secondary | ICD-10-CM | POA: Diagnosis not present

## 2018-01-09 DIAGNOSIS — I4891 Unspecified atrial fibrillation: Secondary | ICD-10-CM | POA: Diagnosis not present

## 2018-01-09 DIAGNOSIS — E46 Unspecified protein-calorie malnutrition: Secondary | ICD-10-CM | POA: Diagnosis not present

## 2018-01-09 DIAGNOSIS — G309 Alzheimer's disease, unspecified: Secondary | ICD-10-CM | POA: Diagnosis not present

## 2018-01-09 DIAGNOSIS — I679 Cerebrovascular disease, unspecified: Secondary | ICD-10-CM | POA: Diagnosis not present

## 2018-01-11 DIAGNOSIS — G309 Alzheimer's disease, unspecified: Secondary | ICD-10-CM | POA: Diagnosis not present

## 2018-01-11 DIAGNOSIS — R1310 Dysphagia, unspecified: Secondary | ICD-10-CM | POA: Diagnosis not present

## 2018-01-11 DIAGNOSIS — R569 Unspecified convulsions: Secondary | ICD-10-CM | POA: Diagnosis not present

## 2018-01-11 DIAGNOSIS — I4891 Unspecified atrial fibrillation: Secondary | ICD-10-CM | POA: Diagnosis not present

## 2018-01-11 DIAGNOSIS — E46 Unspecified protein-calorie malnutrition: Secondary | ICD-10-CM | POA: Diagnosis not present

## 2018-01-11 DIAGNOSIS — I679 Cerebrovascular disease, unspecified: Secondary | ICD-10-CM | POA: Diagnosis not present

## 2018-01-13 DIAGNOSIS — E46 Unspecified protein-calorie malnutrition: Secondary | ICD-10-CM | POA: Diagnosis not present

## 2018-01-13 DIAGNOSIS — I679 Cerebrovascular disease, unspecified: Secondary | ICD-10-CM | POA: Diagnosis not present

## 2018-01-13 DIAGNOSIS — I4891 Unspecified atrial fibrillation: Secondary | ICD-10-CM | POA: Diagnosis not present

## 2018-01-13 DIAGNOSIS — R569 Unspecified convulsions: Secondary | ICD-10-CM | POA: Diagnosis not present

## 2018-01-13 DIAGNOSIS — G309 Alzheimer's disease, unspecified: Secondary | ICD-10-CM | POA: Diagnosis not present

## 2018-01-13 DIAGNOSIS — R1310 Dysphagia, unspecified: Secondary | ICD-10-CM | POA: Diagnosis not present

## 2018-01-16 DIAGNOSIS — R569 Unspecified convulsions: Secondary | ICD-10-CM | POA: Diagnosis not present

## 2018-01-16 DIAGNOSIS — E46 Unspecified protein-calorie malnutrition: Secondary | ICD-10-CM | POA: Diagnosis not present

## 2018-01-16 DIAGNOSIS — I679 Cerebrovascular disease, unspecified: Secondary | ICD-10-CM | POA: Diagnosis not present

## 2018-01-16 DIAGNOSIS — G309 Alzheimer's disease, unspecified: Secondary | ICD-10-CM | POA: Diagnosis not present

## 2018-01-16 DIAGNOSIS — I4891 Unspecified atrial fibrillation: Secondary | ICD-10-CM | POA: Diagnosis not present

## 2018-01-16 DIAGNOSIS — R1310 Dysphagia, unspecified: Secondary | ICD-10-CM | POA: Diagnosis not present

## 2018-01-17 DIAGNOSIS — I4891 Unspecified atrial fibrillation: Secondary | ICD-10-CM | POA: Diagnosis not present

## 2018-01-17 DIAGNOSIS — R1310 Dysphagia, unspecified: Secondary | ICD-10-CM | POA: Diagnosis not present

## 2018-01-17 DIAGNOSIS — I679 Cerebrovascular disease, unspecified: Secondary | ICD-10-CM | POA: Diagnosis not present

## 2018-01-17 DIAGNOSIS — R569 Unspecified convulsions: Secondary | ICD-10-CM | POA: Diagnosis not present

## 2018-01-17 DIAGNOSIS — G309 Alzheimer's disease, unspecified: Secondary | ICD-10-CM | POA: Diagnosis not present

## 2018-01-17 DIAGNOSIS — E46 Unspecified protein-calorie malnutrition: Secondary | ICD-10-CM | POA: Diagnosis not present

## 2018-01-18 DIAGNOSIS — R4701 Aphasia: Secondary | ICD-10-CM | POA: Diagnosis not present

## 2018-01-18 DIAGNOSIS — G309 Alzheimer's disease, unspecified: Secondary | ICD-10-CM | POA: Diagnosis not present

## 2018-01-18 DIAGNOSIS — I679 Cerebrovascular disease, unspecified: Secondary | ICD-10-CM | POA: Diagnosis not present

## 2018-01-18 DIAGNOSIS — E46 Unspecified protein-calorie malnutrition: Secondary | ICD-10-CM | POA: Diagnosis not present

## 2018-01-18 DIAGNOSIS — F0280 Dementia in other diseases classified elsewhere without behavioral disturbance: Secondary | ICD-10-CM | POA: Diagnosis not present

## 2018-01-18 DIAGNOSIS — I4891 Unspecified atrial fibrillation: Secondary | ICD-10-CM | POA: Diagnosis not present

## 2018-01-18 DIAGNOSIS — R569 Unspecified convulsions: Secondary | ICD-10-CM | POA: Diagnosis not present

## 2018-01-18 DIAGNOSIS — Z87898 Personal history of other specified conditions: Secondary | ICD-10-CM | POA: Diagnosis not present

## 2018-01-18 DIAGNOSIS — R1310 Dysphagia, unspecified: Secondary | ICD-10-CM | POA: Diagnosis not present

## 2018-01-20 DIAGNOSIS — I679 Cerebrovascular disease, unspecified: Secondary | ICD-10-CM | POA: Diagnosis not present

## 2018-01-20 DIAGNOSIS — R1310 Dysphagia, unspecified: Secondary | ICD-10-CM | POA: Diagnosis not present

## 2018-01-20 DIAGNOSIS — E46 Unspecified protein-calorie malnutrition: Secondary | ICD-10-CM | POA: Diagnosis not present

## 2018-01-20 DIAGNOSIS — I4891 Unspecified atrial fibrillation: Secondary | ICD-10-CM | POA: Diagnosis not present

## 2018-01-20 DIAGNOSIS — R569 Unspecified convulsions: Secondary | ICD-10-CM | POA: Diagnosis not present

## 2018-01-20 DIAGNOSIS — G309 Alzheimer's disease, unspecified: Secondary | ICD-10-CM | POA: Diagnosis not present

## 2018-01-23 DIAGNOSIS — G309 Alzheimer's disease, unspecified: Secondary | ICD-10-CM | POA: Diagnosis not present

## 2018-01-23 DIAGNOSIS — R1310 Dysphagia, unspecified: Secondary | ICD-10-CM | POA: Diagnosis not present

## 2018-01-23 DIAGNOSIS — E46 Unspecified protein-calorie malnutrition: Secondary | ICD-10-CM | POA: Diagnosis not present

## 2018-01-23 DIAGNOSIS — I4891 Unspecified atrial fibrillation: Secondary | ICD-10-CM | POA: Diagnosis not present

## 2018-01-23 DIAGNOSIS — I679 Cerebrovascular disease, unspecified: Secondary | ICD-10-CM | POA: Diagnosis not present

## 2018-01-23 DIAGNOSIS — R569 Unspecified convulsions: Secondary | ICD-10-CM | POA: Diagnosis not present

## 2018-01-24 DIAGNOSIS — E46 Unspecified protein-calorie malnutrition: Secondary | ICD-10-CM | POA: Diagnosis not present

## 2018-01-24 DIAGNOSIS — G309 Alzheimer's disease, unspecified: Secondary | ICD-10-CM | POA: Diagnosis not present

## 2018-01-24 DIAGNOSIS — R569 Unspecified convulsions: Secondary | ICD-10-CM | POA: Diagnosis not present

## 2018-01-24 DIAGNOSIS — I679 Cerebrovascular disease, unspecified: Secondary | ICD-10-CM | POA: Diagnosis not present

## 2018-01-24 DIAGNOSIS — R1310 Dysphagia, unspecified: Secondary | ICD-10-CM | POA: Diagnosis not present

## 2018-01-24 DIAGNOSIS — I4891 Unspecified atrial fibrillation: Secondary | ICD-10-CM | POA: Diagnosis not present

## 2018-01-25 DIAGNOSIS — I679 Cerebrovascular disease, unspecified: Secondary | ICD-10-CM | POA: Diagnosis not present

## 2018-01-25 DIAGNOSIS — E46 Unspecified protein-calorie malnutrition: Secondary | ICD-10-CM | POA: Diagnosis not present

## 2018-01-25 DIAGNOSIS — R1310 Dysphagia, unspecified: Secondary | ICD-10-CM | POA: Diagnosis not present

## 2018-01-25 DIAGNOSIS — R569 Unspecified convulsions: Secondary | ICD-10-CM | POA: Diagnosis not present

## 2018-01-25 DIAGNOSIS — I4891 Unspecified atrial fibrillation: Secondary | ICD-10-CM | POA: Diagnosis not present

## 2018-01-25 DIAGNOSIS — G309 Alzheimer's disease, unspecified: Secondary | ICD-10-CM | POA: Diagnosis not present

## 2018-01-27 DIAGNOSIS — J301 Allergic rhinitis due to pollen: Secondary | ICD-10-CM | POA: Diagnosis not present

## 2018-01-27 DIAGNOSIS — R1310 Dysphagia, unspecified: Secondary | ICD-10-CM | POA: Diagnosis not present

## 2018-01-27 DIAGNOSIS — I10 Essential (primary) hypertension: Secondary | ICD-10-CM | POA: Diagnosis not present

## 2018-01-27 DIAGNOSIS — R569 Unspecified convulsions: Secondary | ICD-10-CM | POA: Diagnosis not present

## 2018-01-27 DIAGNOSIS — E785 Hyperlipidemia, unspecified: Secondary | ICD-10-CM | POA: Diagnosis not present

## 2018-01-27 DIAGNOSIS — I4891 Unspecified atrial fibrillation: Secondary | ICD-10-CM | POA: Diagnosis not present

## 2018-01-27 DIAGNOSIS — I679 Cerebrovascular disease, unspecified: Secondary | ICD-10-CM | POA: Diagnosis not present

## 2018-01-27 DIAGNOSIS — E46 Unspecified protein-calorie malnutrition: Secondary | ICD-10-CM | POA: Diagnosis not present

## 2018-01-27 DIAGNOSIS — G309 Alzheimer's disease, unspecified: Secondary | ICD-10-CM | POA: Diagnosis not present

## 2018-01-30 DIAGNOSIS — G309 Alzheimer's disease, unspecified: Secondary | ICD-10-CM | POA: Diagnosis not present

## 2018-01-30 DIAGNOSIS — R1310 Dysphagia, unspecified: Secondary | ICD-10-CM | POA: Diagnosis not present

## 2018-01-30 DIAGNOSIS — I4891 Unspecified atrial fibrillation: Secondary | ICD-10-CM | POA: Diagnosis not present

## 2018-01-30 DIAGNOSIS — I679 Cerebrovascular disease, unspecified: Secondary | ICD-10-CM | POA: Diagnosis not present

## 2018-01-30 DIAGNOSIS — E46 Unspecified protein-calorie malnutrition: Secondary | ICD-10-CM | POA: Diagnosis not present

## 2018-01-30 DIAGNOSIS — R569 Unspecified convulsions: Secondary | ICD-10-CM | POA: Diagnosis not present

## 2018-02-01 DIAGNOSIS — R1310 Dysphagia, unspecified: Secondary | ICD-10-CM | POA: Diagnosis not present

## 2018-02-01 DIAGNOSIS — E46 Unspecified protein-calorie malnutrition: Secondary | ICD-10-CM | POA: Diagnosis not present

## 2018-02-01 DIAGNOSIS — I4891 Unspecified atrial fibrillation: Secondary | ICD-10-CM | POA: Diagnosis not present

## 2018-02-01 DIAGNOSIS — I679 Cerebrovascular disease, unspecified: Secondary | ICD-10-CM | POA: Diagnosis not present

## 2018-02-01 DIAGNOSIS — R569 Unspecified convulsions: Secondary | ICD-10-CM | POA: Diagnosis not present

## 2018-02-01 DIAGNOSIS — G309 Alzheimer's disease, unspecified: Secondary | ICD-10-CM | POA: Diagnosis not present

## 2018-02-06 DIAGNOSIS — R1310 Dysphagia, unspecified: Secondary | ICD-10-CM | POA: Diagnosis not present

## 2018-02-06 DIAGNOSIS — I4891 Unspecified atrial fibrillation: Secondary | ICD-10-CM | POA: Diagnosis not present

## 2018-02-06 DIAGNOSIS — E46 Unspecified protein-calorie malnutrition: Secondary | ICD-10-CM | POA: Diagnosis not present

## 2018-02-06 DIAGNOSIS — G309 Alzheimer's disease, unspecified: Secondary | ICD-10-CM | POA: Diagnosis not present

## 2018-02-06 DIAGNOSIS — I679 Cerebrovascular disease, unspecified: Secondary | ICD-10-CM | POA: Diagnosis not present

## 2018-02-06 DIAGNOSIS — R569 Unspecified convulsions: Secondary | ICD-10-CM | POA: Diagnosis not present

## 2018-02-07 DIAGNOSIS — R1310 Dysphagia, unspecified: Secondary | ICD-10-CM | POA: Diagnosis not present

## 2018-02-07 DIAGNOSIS — R569 Unspecified convulsions: Secondary | ICD-10-CM | POA: Diagnosis not present

## 2018-02-07 DIAGNOSIS — G309 Alzheimer's disease, unspecified: Secondary | ICD-10-CM | POA: Diagnosis not present

## 2018-02-07 DIAGNOSIS — E46 Unspecified protein-calorie malnutrition: Secondary | ICD-10-CM | POA: Diagnosis not present

## 2018-02-07 DIAGNOSIS — I679 Cerebrovascular disease, unspecified: Secondary | ICD-10-CM | POA: Diagnosis not present

## 2018-02-07 DIAGNOSIS — I4891 Unspecified atrial fibrillation: Secondary | ICD-10-CM | POA: Diagnosis not present

## 2018-02-08 DIAGNOSIS — I4891 Unspecified atrial fibrillation: Secondary | ICD-10-CM | POA: Diagnosis not present

## 2018-02-08 DIAGNOSIS — E46 Unspecified protein-calorie malnutrition: Secondary | ICD-10-CM | POA: Diagnosis not present

## 2018-02-08 DIAGNOSIS — R1310 Dysphagia, unspecified: Secondary | ICD-10-CM | POA: Diagnosis not present

## 2018-02-08 DIAGNOSIS — G309 Alzheimer's disease, unspecified: Secondary | ICD-10-CM | POA: Diagnosis not present

## 2018-02-08 DIAGNOSIS — I679 Cerebrovascular disease, unspecified: Secondary | ICD-10-CM | POA: Diagnosis not present

## 2018-02-08 DIAGNOSIS — R569 Unspecified convulsions: Secondary | ICD-10-CM | POA: Diagnosis not present

## 2018-02-09 DIAGNOSIS — E46 Unspecified protein-calorie malnutrition: Secondary | ICD-10-CM | POA: Diagnosis not present

## 2018-02-09 DIAGNOSIS — G309 Alzheimer's disease, unspecified: Secondary | ICD-10-CM | POA: Diagnosis not present

## 2018-02-09 DIAGNOSIS — I4891 Unspecified atrial fibrillation: Secondary | ICD-10-CM | POA: Diagnosis not present

## 2018-02-09 DIAGNOSIS — R1310 Dysphagia, unspecified: Secondary | ICD-10-CM | POA: Diagnosis not present

## 2018-02-09 DIAGNOSIS — I679 Cerebrovascular disease, unspecified: Secondary | ICD-10-CM | POA: Diagnosis not present

## 2018-02-09 DIAGNOSIS — R569 Unspecified convulsions: Secondary | ICD-10-CM | POA: Diagnosis not present

## 2018-02-14 ENCOUNTER — Non-Acute Institutional Stay: Payer: Medicare Other | Admitting: Licensed Clinical Social Worker

## 2018-02-14 DIAGNOSIS — Z515 Encounter for palliative care: Secondary | ICD-10-CM

## 2018-02-15 NOTE — Progress Notes (Signed)
COMMUNITY PALLIATIVE CARE SW NOTE  PATIENT NAME: [name] DOB: 12/10/1924 MRN: [id number]  PRIMARY CARE PROVIDER: Carter, Monica, DO  RESPONSIBLE PARTY:  Acct ID - Guarantor Home Phone Work Phone Relationship Acct Type  [id number] - [place],Jniyah* [phone]  Self P/F     [address], [place], [place] [address]     PLAN OF CARE and INTERVENTIONS:             1. GOALS OF CARE/ ADVANCE CARE PLANNING:  Goal is for patient to remain at the facility and remain comfortable.  Patient has a MOST form. 2. SOCIAL/EMOTIONAL/SPIRITUAL ASSESSMENT/ INTERVENTIONS:  SW met with patient and facility Admissions Coord, Kesha, at [facility].  SW is familiar with patient when she was under Hospice from 01/13/17-02/12/18.  Patient was admitted from home on 08/22/16.  Her three daughters all live together in [place].  Patient is of [employer] faith.  Her MOST form indicates DNR, limited comfort, limited ABT, trial IV and no feeding tubes.  Patient was lying in bed sleeping and did not respond to verbal/tactile prompts.  She did not display any nonverbal indicators of pain. 3. PATIENT/CAREGIVER EDUCATION/ COPING:  Patient's daughters express themselves openly. 4. PERSONAL EMERGENCY PLAN:  Per facility protocol. 5. COMMUNITY RESOURCES COORDINATION/ HEALTH CARE NAVIGATION:  None. 6. FINANCIAL/LEGAL CONCERNS/INTERVENTIONS:  None.     SOCIAL HX:  Social History   Tobacco Use  . Smoking status: Former Smoker  . Smokeless tobacco: Never Used  Substance Use Topics  . Alcohol use: No    Comment: 08/01/2012 "glass of wine on special occasions"    CODE STATUS:  DNR  ADVANCED DIRECTIVES: N MOST FORM COMPLETE:  Y HOSPICE EDUCATION PROVIDED: Y PPS:  Patient's appetite has decreased.  She is total care. Duration of visit and documentation:  45 minutes.       Z , LCSW 

## 2018-02-22 ENCOUNTER — Telehealth: Payer: Self-pay | Admitting: Licensed Clinical Social Worker

## 2018-02-22 NOTE — Telephone Encounter (Signed)
Palliative Care SW spoke with patient's daughter, Shona Needles, per the request of Gonzella Lex, NP.  A meeting is scheduled with [facility] and the Palliative Care RN, Daryl Eastern, on Thursday, December 5th, at 11:30.

## 2018-02-24 ENCOUNTER — Non-Acute Institutional Stay: Payer: Medicare Other | Admitting: Licensed Clinical Social Worker

## 2018-02-24 DIAGNOSIS — Z515 Encounter for palliative care: Secondary | ICD-10-CM

## 2018-02-24 NOTE — Progress Notes (Signed)
COMMUNITY PALLIATIVE CARE SW NOTE  PATIENT NAME: [name] DOB: March 21, 1925 MRN: [id number]  PRIMARY CARE PROVIDER: Gildardo Cranker, DO  RESPONSIBLE PARTY:  Acct ID - Guarantor Home Phone Work Phone Relationship Acct Type  [id number] Toni [phone]  Self P/F     [place], [place], [place] [address]     PLAN OF CARE and INTERVENTIONS:             1. GOALS OF CARE/ ADVANCE CARE PLANNING:  Goal is for patient to remain in the facility and remain comfortable.  She has a MOST form. 2. SOCIAL/EMOTIONAL/SPIRITUAL ASSESSMENT/ INTERVENTIONS:  SW met with patient and her daughter, [name], in patient's room at [facility].  SW also consulted facility nurse, [name].  Patient was alert in bed.  She was able to state 1-3 words.  She gave good eye contact.  [name] reported patient ate a minimal amount the past few days.  Daughter is a very good advocate for patient.  She attempts to engage her mother as much as possible. 3. PATIENT/CAREGIVER EDUCATION/ COPING:  Daughters express themselves openly.  SW provided education regarding the Palliative Care/[facility] Program.  She Gregory she understood. 4. PERSONAL EMERGENCY PLAN:  Per facility protocol. 5. COMMUNITY RESOURCES COORDINATION/ HEALTH CARE NAVIGATION:  None. 6. FINANCIAL/LEGAL CONCERNS/INTERVENTIONS:  None.     SOCIAL HX:  Social History   Tobacco Use  . Smoking status: Former Research scientist (life sciences)  . Smokeless tobacco: Never Used  Substance Use Topics  . Alcohol use: No    Comment: 08/01/2012 "glass of wine on special occasions"    CODE STATUS:  DNR  ADVANCED DIRECTIVES: N MOST FORM COMPLETE:  Y HOSPICE EDUCATION PROVIDED:  Patient was previously under Hospice care but was discharged due to a prognosis of greater than six months. PPS:  [name] patient ate a "good" breakfast.  Patient is total care for all ADLs. Duration of visit and documentation:  50 minutes.      Toni Corn Jonnell Hentges, LCSW

## 2018-03-02 ENCOUNTER — Non-Acute Institutional Stay: Payer: Medicare Other | Admitting: Licensed Clinical Social Worker

## 2018-03-02 ENCOUNTER — Non-Acute Institutional Stay: Payer: Medicare Other | Admitting: *Deleted

## 2018-03-02 DIAGNOSIS — Z515 Encounter for palliative care: Secondary | ICD-10-CM

## 2018-03-02 NOTE — Progress Notes (Signed)
COMMUNITY PALLIATIVE CARE SW NOTE  PATIENT NAME: [name] DOB: Nov 15, 1924 MRN: [id number]  PRIMARY CARE PROVIDER: Gildardo Cranker, DO  RESPONSIBLE PARTY:  Acct ID - Guarantor Home Phone Work Phone Relationship Acct Type  [id number] [name]  Self P/F     [place], [place], [place] [address]   PLAN OF CARE and INTERVENTIONS:             1. GOALS OF CARE/ ADVANCE CARE PLANNING:  Daughter, Shona Needles, wishes for patient to remain at the facility and not be hospitalized.  She has a DNR MOST form. 2. SOCIAL/EMOTIONAL/SPIRITUAL ASSESSMENT/ INTERVENTIONS:  SW and Palliative Care RN, Daryl Eastern, met with patient and her daughter, Shona Needles, in patient's room at [facility].  Patient was in bed.  She did not display any nonverbal indicators of pain.  She responded with facial expressions and gave good eye contact.  She was able to state 1-3 words.  There is a new wound on patient's left heal which is being treated. 3. PATIENT/CAREGIVER EDUCATION/ COPING:  Daughter expresses herself openly.  4. PERSONAL EMERGENCY PLAN:  Per facility protocol. 5. COMMUNITY RESOURCES COORDINATION/ HEALTH CARE NAVIGATION:  None. 6. FINANCIAL/LEGAL CONCERNS/INTERVENTIONS:  None.          SOCIAL HX:  Social History   Tobacco Use  . Smoking status: Former Research scientist (life sciences)  . Smokeless tobacco: Never Used  Substance Use Topics  . Alcohol use: No    Comment: 08/01/2012 "glass of wine on special occasions"   CODE STATUS:  DNR  ADVANCED DIRECTIVES: N MOST FORM COMPLETE:  Y HOSPICE EDUCATION PROVIDED:  Y PPS:  Patient's daughter questions her intake since she has lost five pounds last month.  Patient is total care for all ADLs. Duration of visit and documentation:  75 minutes.      Creola Corn Johnny Gorter, LCSW

## 2018-03-03 NOTE — Progress Notes (Signed)
COMMUNITY PALLIATIVE CARE   PATIENT NAME: [name] DOB: 01-28-1925 MRN: [id number]  PRIMARY CARE PROVIDER: Gildardo Cranker, DO  RESPONSIBLE PARTY:  Acct ID - Guarantor Home Phone Work Phone Relationship Acct Type  [id number] Toni [phone]  Self P/F     [place], [place], [place] [address]    PLAN OF CARE and INTERVENTION:  1. ADVANCE CARE PLANNING/GOALS OF CARE: Remain at current facility, avoid going to the hospital, comfort care. She is a DNR 2. PATIENT/CAREGIVER EDUCATION: Explained Palliative Care Services and Wound Maintenance/Prevention 3. DISEASE STATUS: Joint visit made with Palliative Care SW, [name]. Met with daughter, [name], in patient's room. Patient is awake and alert. She is minimally verbal. She smiles on and off throughout visit. Intermittent confusion. She appears comfortable with no physical indicators of pain noted. Daughter has been concerned regarding patient's intake, which has slightly improved, but is variable from day to day. Breakfast is patient's best meal. However, patient still pockets food at times and requires frequent prompting. Staff CNA reports that when she feeds patient, patient starts to clench her mouth shut and starts grinding her teeth. She has lost 4.6 lbs since last month with a current weight of 128. 6 lbs. She drinks a Protein Shake and has Magic Cup with meals to help with nutritional supplementation. She has a new pressure wound noted to her L heel. She wears Provolone boots daily. Slightly elevated patient's leg using a towel underneath to float her L heel. Slight swelling noted to her bilateral elbows, which are being elevated on small pillows. She is total care for all ADLs and incontinent of both bowel and bladder. Will continue to monitor.  HISTORY OF PRESENT ILLNESS:  This is a 84 yo female who resides at [facility] facility. She was recently discharged from hospice services for a prognosis of greater than 6 months.  Palliative Care Team was asked to follow patient. Will visit patient monthly and PRN.  CODE STATUS: DNR  ADVANCED DIRECTIVES: N MOST FORM: yes PPS: 30%   PHYSICAL EXAM:   LUNGS: clear to auscultation  CARDIAC: Cor RRR EXTREMITIES: Trace swelling to bilateral elbows, elevated on pillows SKIN: New pressure injury to L heel 2.5 cm x 2.5 cm in diameter being treated daily  NEURO: Alert and oriented to self, minimally verbal, L hand contracture, total care   (Duration of visit and documentation 75 minutes)    Daryl Eastern, RN, BSN

## 2018-03-09 DIAGNOSIS — B351 Tinea unguium: Secondary | ICD-10-CM | POA: Diagnosis not present

## 2018-03-09 DIAGNOSIS — I739 Peripheral vascular disease, unspecified: Secondary | ICD-10-CM | POA: Diagnosis not present

## 2018-03-10 DIAGNOSIS — M6281 Muscle weakness (generalized): Secondary | ICD-10-CM | POA: Diagnosis not present

## 2018-03-10 DIAGNOSIS — R1312 Dysphagia, oropharyngeal phase: Secondary | ICD-10-CM | POA: Diagnosis not present

## 2018-03-10 DIAGNOSIS — I6990 Unspecified sequelae of unspecified cerebrovascular disease: Secondary | ICD-10-CM | POA: Diagnosis not present

## 2018-03-10 DIAGNOSIS — R293 Abnormal posture: Secondary | ICD-10-CM | POA: Diagnosis not present

## 2018-03-10 DIAGNOSIS — R41841 Cognitive communication deficit: Secondary | ICD-10-CM | POA: Diagnosis not present

## 2018-03-13 DIAGNOSIS — G3183 Dementia with Lewy bodies: Secondary | ICD-10-CM | POA: Diagnosis not present

## 2018-03-13 DIAGNOSIS — Z87898 Personal history of other specified conditions: Secondary | ICD-10-CM | POA: Diagnosis not present

## 2018-03-13 DIAGNOSIS — I4891 Unspecified atrial fibrillation: Secondary | ICD-10-CM | POA: Diagnosis not present

## 2018-03-13 DIAGNOSIS — E46 Unspecified protein-calorie malnutrition: Secondary | ICD-10-CM | POA: Diagnosis not present

## 2018-03-14 DIAGNOSIS — Z79899 Other long term (current) drug therapy: Secondary | ICD-10-CM | POA: Diagnosis not present

## 2018-03-15 DIAGNOSIS — E46 Unspecified protein-calorie malnutrition: Secondary | ICD-10-CM | POA: Diagnosis not present

## 2018-03-15 DIAGNOSIS — G3183 Dementia with Lewy bodies: Secondary | ICD-10-CM | POA: Diagnosis not present

## 2018-03-15 DIAGNOSIS — I4891 Unspecified atrial fibrillation: Secondary | ICD-10-CM | POA: Diagnosis not present

## 2018-03-15 DIAGNOSIS — R4701 Aphasia: Secondary | ICD-10-CM | POA: Diagnosis not present

## 2018-03-16 DIAGNOSIS — R41841 Cognitive communication deficit: Secondary | ICD-10-CM | POA: Diagnosis not present

## 2018-03-16 DIAGNOSIS — R1312 Dysphagia, oropharyngeal phase: Secondary | ICD-10-CM | POA: Diagnosis not present

## 2018-03-16 DIAGNOSIS — R293 Abnormal posture: Secondary | ICD-10-CM | POA: Diagnosis not present

## 2018-03-16 DIAGNOSIS — M6281 Muscle weakness (generalized): Secondary | ICD-10-CM | POA: Diagnosis not present

## 2018-03-16 DIAGNOSIS — I6990 Unspecified sequelae of unspecified cerebrovascular disease: Secondary | ICD-10-CM | POA: Diagnosis not present

## 2018-03-21 DIAGNOSIS — R41841 Cognitive communication deficit: Secondary | ICD-10-CM | POA: Diagnosis not present

## 2018-03-21 DIAGNOSIS — R293 Abnormal posture: Secondary | ICD-10-CM | POA: Diagnosis not present

## 2018-03-21 DIAGNOSIS — R1312 Dysphagia, oropharyngeal phase: Secondary | ICD-10-CM | POA: Diagnosis not present

## 2018-03-21 DIAGNOSIS — M6281 Muscle weakness (generalized): Secondary | ICD-10-CM | POA: Diagnosis not present

## 2018-03-21 DIAGNOSIS — I6990 Unspecified sequelae of unspecified cerebrovascular disease: Secondary | ICD-10-CM | POA: Diagnosis not present

## 2018-03-22 DIAGNOSIS — R293 Abnormal posture: Secondary | ICD-10-CM | POA: Diagnosis not present

## 2018-03-22 DIAGNOSIS — M6281 Muscle weakness (generalized): Secondary | ICD-10-CM | POA: Diagnosis not present

## 2018-03-22 DIAGNOSIS — R41841 Cognitive communication deficit: Secondary | ICD-10-CM | POA: Diagnosis not present

## 2018-03-22 DIAGNOSIS — I6990 Unspecified sequelae of unspecified cerebrovascular disease: Secondary | ICD-10-CM | POA: Diagnosis not present

## 2018-03-22 DIAGNOSIS — R1312 Dysphagia, oropharyngeal phase: Secondary | ICD-10-CM | POA: Diagnosis not present

## 2018-03-23 ENCOUNTER — Telehealth: Payer: Self-pay

## 2018-03-23 DIAGNOSIS — R05 Cough: Secondary | ICD-10-CM | POA: Diagnosis not present

## 2018-03-23 DIAGNOSIS — R41841 Cognitive communication deficit: Secondary | ICD-10-CM | POA: Diagnosis not present

## 2018-03-23 DIAGNOSIS — R293 Abnormal posture: Secondary | ICD-10-CM | POA: Diagnosis not present

## 2018-03-23 DIAGNOSIS — R1310 Dysphagia, unspecified: Secondary | ICD-10-CM | POA: Diagnosis not present

## 2018-03-23 DIAGNOSIS — R1312 Dysphagia, oropharyngeal phase: Secondary | ICD-10-CM | POA: Diagnosis not present

## 2018-03-23 DIAGNOSIS — M6281 Muscle weakness (generalized): Secondary | ICD-10-CM | POA: Diagnosis not present

## 2018-03-23 DIAGNOSIS — I6990 Unspecified sequelae of unspecified cerebrovascular disease: Secondary | ICD-10-CM | POA: Diagnosis not present

## 2018-03-23 NOTE — Telephone Encounter (Signed)
Daughter called in to office to report that patient has not been eating well for past few days and is not as interactive as before.  Patient update relayed to palliative care team

## 2018-03-24 DIAGNOSIS — M6281 Muscle weakness (generalized): Secondary | ICD-10-CM | POA: Diagnosis not present

## 2018-03-24 DIAGNOSIS — I6990 Unspecified sequelae of unspecified cerebrovascular disease: Secondary | ICD-10-CM | POA: Diagnosis not present

## 2018-03-24 DIAGNOSIS — R41841 Cognitive communication deficit: Secondary | ICD-10-CM | POA: Diagnosis not present

## 2018-03-24 DIAGNOSIS — R1312 Dysphagia, oropharyngeal phase: Secondary | ICD-10-CM | POA: Diagnosis not present

## 2018-03-24 DIAGNOSIS — R293 Abnormal posture: Secondary | ICD-10-CM | POA: Diagnosis not present

## 2018-03-25 DIAGNOSIS — R1312 Dysphagia, oropharyngeal phase: Secondary | ICD-10-CM | POA: Diagnosis not present

## 2018-03-25 DIAGNOSIS — I6990 Unspecified sequelae of unspecified cerebrovascular disease: Secondary | ICD-10-CM | POA: Diagnosis not present

## 2018-03-25 DIAGNOSIS — R41841 Cognitive communication deficit: Secondary | ICD-10-CM | POA: Diagnosis not present

## 2018-03-25 DIAGNOSIS — R293 Abnormal posture: Secondary | ICD-10-CM | POA: Diagnosis not present

## 2018-03-25 DIAGNOSIS — M6281 Muscle weakness (generalized): Secondary | ICD-10-CM | POA: Diagnosis not present

## 2018-03-27 ENCOUNTER — Non-Acute Institutional Stay: Payer: Medicare Other | Admitting: *Deleted

## 2018-03-27 VITALS — BP 116/81 | HR 73 | Temp 97.7°F | Resp 16

## 2018-03-27 DIAGNOSIS — Z515 Encounter for palliative care: Secondary | ICD-10-CM

## 2018-03-27 NOTE — Progress Notes (Signed)
COMMUNITY PALLIATIVE CARE RN NOTE  PATIENT NAME: [name] DOB: Feb 20, 1925 MRN: [id number]  PRIMARY CARE PROVIDER: Gildardo Cranker, DO  RESPONSIBLE PARTY:  Acct ID - Guarantor Home Phone Work Phone Relationship Acct Type  [id number] [name]  Self P/F     [place], [place], [place] [address]    PLAN OF CARE and INTERVENTION:  1. ADVANCE CARE PLANNING/GOALS OF CARE: Remain at current facility and avoid going to the hospital 2. PATIENT/CAREGIVER EDUCATION: Reinforced Skin Breakdown Prevention/Management and Safe Swallowing 3. DISEASE STATUS: Met with patient's two daughters at facility. They have been concerned that patient has not been eating well. Reports that most meals she is only eating a few bites and seems to be holding food in her mouth and taking a long time to swallow. Upon swallowing, patient is grimacing as if it is painful. Daughter was able to get patient to take in a few bites of a Magic Cup for lunch during visit today. It used to be that she was having trouble swallowing thin liquids, but now she is having issues with thicker foods. She appears thinner, especially in face and shoulder region. Her L heel wound is healing well. Continues with Allevyn to heel for protection and Prevalon Heel Protector Boots bilaterally. Spoke with ADON after visiting with patient and family. She reports that they have ordered a Speech Therapy Evaluation. Also they will be weighing patient in a few days. She lost 5 lbs from November to December and last weight reported was 128.6 lbs. Will continue to assess for hospice eligibility, but will wait until ST Eval is completed as well as current weight for more information. Will continue to monitor.  HISTORY OF PRESENT ILLNESS:  This is a 84 yo female who resides at [facility]. Palliative Care Team continues to follow patient. Will follow up with daughter at end of week.  CODE STATUS: DNR ADVANCED  DIRECTIVES: N MOST FORM: yes PPS: 30%   PHYSICAL EXAM:   VITALS: Today's Vitals   03/27/18 1201  BP: 116/81  Pulse: 73  Resp: 16  Temp: 97.7 F (36.5 C)  TempSrc: Temporal  SpO2: 96%  PainSc: 0-No pain    LUNGS: clear to auscultation  CARDIAC: Cor RRR EXTREMITIES: No edema SKIN: L heel wound improving, Heel protector in place and dressing is dry and intact  NEURO: Alert to self, able to follow simple commands, nonverbal today, bedbound   (Duration of visit and documentation 45 minutes)    Daryl Eastern, RN, BSN

## 2018-03-30 DIAGNOSIS — M6281 Muscle weakness (generalized): Secondary | ICD-10-CM | POA: Diagnosis not present

## 2018-03-30 DIAGNOSIS — I6990 Unspecified sequelae of unspecified cerebrovascular disease: Secondary | ICD-10-CM | POA: Diagnosis not present

## 2018-03-30 DIAGNOSIS — R293 Abnormal posture: Secondary | ICD-10-CM | POA: Diagnosis not present

## 2018-03-30 DIAGNOSIS — R1312 Dysphagia, oropharyngeal phase: Secondary | ICD-10-CM | POA: Diagnosis not present

## 2018-03-30 DIAGNOSIS — R41841 Cognitive communication deficit: Secondary | ICD-10-CM | POA: Diagnosis not present

## 2018-04-04 DIAGNOSIS — R41841 Cognitive communication deficit: Secondary | ICD-10-CM | POA: Diagnosis not present

## 2018-04-04 DIAGNOSIS — M6281 Muscle weakness (generalized): Secondary | ICD-10-CM | POA: Diagnosis not present

## 2018-04-04 DIAGNOSIS — R1312 Dysphagia, oropharyngeal phase: Secondary | ICD-10-CM | POA: Diagnosis not present

## 2018-04-04 DIAGNOSIS — I6990 Unspecified sequelae of unspecified cerebrovascular disease: Secondary | ICD-10-CM | POA: Diagnosis not present

## 2018-04-04 DIAGNOSIS — R293 Abnormal posture: Secondary | ICD-10-CM | POA: Diagnosis not present

## 2018-04-05 DIAGNOSIS — R293 Abnormal posture: Secondary | ICD-10-CM | POA: Diagnosis not present

## 2018-04-05 DIAGNOSIS — R1312 Dysphagia, oropharyngeal phase: Secondary | ICD-10-CM | POA: Diagnosis not present

## 2018-04-05 DIAGNOSIS — M6281 Muscle weakness (generalized): Secondary | ICD-10-CM | POA: Diagnosis not present

## 2018-04-05 DIAGNOSIS — R41841 Cognitive communication deficit: Secondary | ICD-10-CM | POA: Diagnosis not present

## 2018-04-05 DIAGNOSIS — I6990 Unspecified sequelae of unspecified cerebrovascular disease: Secondary | ICD-10-CM | POA: Diagnosis not present

## 2018-04-06 DIAGNOSIS — R41841 Cognitive communication deficit: Secondary | ICD-10-CM | POA: Diagnosis not present

## 2018-04-06 DIAGNOSIS — R293 Abnormal posture: Secondary | ICD-10-CM | POA: Diagnosis not present

## 2018-04-06 DIAGNOSIS — I6990 Unspecified sequelae of unspecified cerebrovascular disease: Secondary | ICD-10-CM | POA: Diagnosis not present

## 2018-04-06 DIAGNOSIS — M6281 Muscle weakness (generalized): Secondary | ICD-10-CM | POA: Diagnosis not present

## 2018-04-06 DIAGNOSIS — R1312 Dysphagia, oropharyngeal phase: Secondary | ICD-10-CM | POA: Diagnosis not present

## 2018-04-07 DIAGNOSIS — I6990 Unspecified sequelae of unspecified cerebrovascular disease: Secondary | ICD-10-CM | POA: Diagnosis not present

## 2018-04-07 DIAGNOSIS — R1312 Dysphagia, oropharyngeal phase: Secondary | ICD-10-CM | POA: Diagnosis not present

## 2018-04-07 DIAGNOSIS — R293 Abnormal posture: Secondary | ICD-10-CM | POA: Diagnosis not present

## 2018-04-07 DIAGNOSIS — R41841 Cognitive communication deficit: Secondary | ICD-10-CM | POA: Diagnosis not present

## 2018-04-07 DIAGNOSIS — M6281 Muscle weakness (generalized): Secondary | ICD-10-CM | POA: Diagnosis not present

## 2018-04-08 DIAGNOSIS — M6281 Muscle weakness (generalized): Secondary | ICD-10-CM | POA: Diagnosis not present

## 2018-04-08 DIAGNOSIS — I6990 Unspecified sequelae of unspecified cerebrovascular disease: Secondary | ICD-10-CM | POA: Diagnosis not present

## 2018-04-08 DIAGNOSIS — R293 Abnormal posture: Secondary | ICD-10-CM | POA: Diagnosis not present

## 2018-04-08 DIAGNOSIS — R41841 Cognitive communication deficit: Secondary | ICD-10-CM | POA: Diagnosis not present

## 2018-04-08 DIAGNOSIS — R1312 Dysphagia, oropharyngeal phase: Secondary | ICD-10-CM | POA: Diagnosis not present

## 2018-04-10 ENCOUNTER — Non-Acute Institutional Stay: Payer: Medicare Other | Admitting: *Deleted

## 2018-04-10 VITALS — BP 107/75 | HR 74 | Temp 97.1°F | Resp 16 | Wt 125.2 lb

## 2018-04-10 DIAGNOSIS — R2231 Localized swelling, mass and lump, right upper limb: Secondary | ICD-10-CM | POA: Diagnosis not present

## 2018-04-10 DIAGNOSIS — Z515 Encounter for palliative care: Secondary | ICD-10-CM

## 2018-04-11 DIAGNOSIS — G4089 Other seizures: Secondary | ICD-10-CM | POA: Diagnosis not present

## 2018-04-11 DIAGNOSIS — R05 Cough: Secondary | ICD-10-CM | POA: Diagnosis not present

## 2018-04-11 DIAGNOSIS — F0280 Dementia in other diseases classified elsewhere without behavioral disturbance: Secondary | ICD-10-CM | POA: Diagnosis not present

## 2018-04-11 DIAGNOSIS — I480 Paroxysmal atrial fibrillation: Secondary | ICD-10-CM | POA: Diagnosis not present

## 2018-04-11 NOTE — Progress Notes (Signed)
COMMUNITY PALLIATIVE CARE RN NOTE  PATIENT NAME: [name] DOB: Mar 07, 1925 MRN: [id number]  PRIMARY CARE PROVIDER: Gildardo Cranker, DO  RESPONSIBLE PARTY:  Acct ID - Guarantor Home Phone Work Phone Relationship Acct Type  [id number] [name]  Self P/F     [place], [place], [place] [address]    PLAN OF CARE and INTERVENTION:  1. ADVANCE CARE PLANNING/GOALS OF CARE: Remain at current facility and avoid going to the hospital 2. PATIENT/CAREGIVER EDUCATION: Reinforced elevation of extremities to help with edema 3. DISEASE STATUS: Met with patient and daughters in her room at the facility. She is lying in bed, with HOB elevated and is awake and alert. She is mainly nonverbal but does make eye contact. Daughters are concerned regarding the increased edema noted in her R elbow. Trace edema noted in L elbow. Elevated both elbows on pillows to help decrease edema. No edema noted in bilateral lower extremities. Nonproductive congested cough heard on and off throughout visit. Rhonchi heard during ausculation in bilateral upper lobes. Patient too weak to expectorate phlegm. Her intake is fair. She is eating 50-100% of breakfast, however for the rest of the day she may only eat a few bites to 25%. She is on a Pureed diet with thin liquids. She was seen by a Speech Therapist who gave a suggestion for patient's intermittent dysphagia, a Modified barium swallow, however daughters did not want to pursue this option. She has lost 3 lbs since last month. Current weight 125.2 lbs. Wound to L foot is almost healed. Only small scab noted. Allevyn pad kept on area for protection. She continues with Prevalon heel protectors to both feet as well. Left written communication for facility NP regarding elbow swelling and congestion in order for NP to assess patient this week. Will continue to monitor.  HISTORY OF PRESENT ILLNESS: This is a 85 yo female who resides at [facility] facility.  Palliative Care Team continues to follow patient. Team to continue to visit monthly and PRN.   CODE STATUS: DNR ADVANCED DIRECTIVES: N MOST FORM: yes PPS: 30%   PHYSICAL EXAM:   VITALS: Today's Vitals   04/10/18 1355  BP: 107/75  Pulse: 74  Resp: 16  Temp: (!) 97.1 F (36.2 C)  TempSrc: Temporal  SpO2: 92%  Weight: 125 lb 3.2 oz (56.8 kg)  PainSc: 0-No pain    LUNGS: Rhonchi present in bilateral upper lobes, nonproductive/congested cough CARDIAC: Cor RRR EXTREMITIES: Increased edema noted to R elbow and trace edema to L elbow SKIN: Small scab noted to L heel. Area almost completely healed  NEURO: Alert and oriented to self only, minimally verbal, generalized weakness, non-ambulatory   (Duration of visit and documentation 75 minutes)    Daryl Eastern, RN, BSN

## 2018-04-14 DIAGNOSIS — R1312 Dysphagia, oropharyngeal phase: Secondary | ICD-10-CM | POA: Diagnosis not present

## 2018-04-14 DIAGNOSIS — M6281 Muscle weakness (generalized): Secondary | ICD-10-CM | POA: Diagnosis not present

## 2018-04-14 DIAGNOSIS — R293 Abnormal posture: Secondary | ICD-10-CM | POA: Diagnosis not present

## 2018-04-14 DIAGNOSIS — R41841 Cognitive communication deficit: Secondary | ICD-10-CM | POA: Diagnosis not present

## 2018-04-14 DIAGNOSIS — I6990 Unspecified sequelae of unspecified cerebrovascular disease: Secondary | ICD-10-CM | POA: Diagnosis not present

## 2018-04-17 DIAGNOSIS — R41841 Cognitive communication deficit: Secondary | ICD-10-CM | POA: Diagnosis not present

## 2018-04-17 DIAGNOSIS — M6281 Muscle weakness (generalized): Secondary | ICD-10-CM | POA: Diagnosis not present

## 2018-04-17 DIAGNOSIS — I6990 Unspecified sequelae of unspecified cerebrovascular disease: Secondary | ICD-10-CM | POA: Diagnosis not present

## 2018-04-17 DIAGNOSIS — R293 Abnormal posture: Secondary | ICD-10-CM | POA: Diagnosis not present

## 2018-04-17 DIAGNOSIS — R1312 Dysphagia, oropharyngeal phase: Secondary | ICD-10-CM | POA: Diagnosis not present

## 2018-04-19 DIAGNOSIS — R41841 Cognitive communication deficit: Secondary | ICD-10-CM | POA: Diagnosis not present

## 2018-04-19 DIAGNOSIS — R1312 Dysphagia, oropharyngeal phase: Secondary | ICD-10-CM | POA: Diagnosis not present

## 2018-04-19 DIAGNOSIS — M6281 Muscle weakness (generalized): Secondary | ICD-10-CM | POA: Diagnosis not present

## 2018-04-19 DIAGNOSIS — R293 Abnormal posture: Secondary | ICD-10-CM | POA: Diagnosis not present

## 2018-04-19 DIAGNOSIS — I6990 Unspecified sequelae of unspecified cerebrovascular disease: Secondary | ICD-10-CM | POA: Diagnosis not present

## 2018-04-21 DIAGNOSIS — R293 Abnormal posture: Secondary | ICD-10-CM | POA: Diagnosis not present

## 2018-04-21 DIAGNOSIS — R41841 Cognitive communication deficit: Secondary | ICD-10-CM | POA: Diagnosis not present

## 2018-04-21 DIAGNOSIS — I6990 Unspecified sequelae of unspecified cerebrovascular disease: Secondary | ICD-10-CM | POA: Diagnosis not present

## 2018-04-21 DIAGNOSIS — M6281 Muscle weakness (generalized): Secondary | ICD-10-CM | POA: Diagnosis not present

## 2018-04-21 DIAGNOSIS — R1312 Dysphagia, oropharyngeal phase: Secondary | ICD-10-CM | POA: Diagnosis not present

## 2018-04-24 DIAGNOSIS — R41841 Cognitive communication deficit: Secondary | ICD-10-CM | POA: Diagnosis not present

## 2018-04-24 DIAGNOSIS — R293 Abnormal posture: Secondary | ICD-10-CM | POA: Diagnosis not present

## 2018-04-24 DIAGNOSIS — M6281 Muscle weakness (generalized): Secondary | ICD-10-CM | POA: Diagnosis not present

## 2018-04-24 DIAGNOSIS — I6990 Unspecified sequelae of unspecified cerebrovascular disease: Secondary | ICD-10-CM | POA: Diagnosis not present

## 2018-04-24 DIAGNOSIS — R1312 Dysphagia, oropharyngeal phase: Secondary | ICD-10-CM | POA: Diagnosis not present

## 2018-04-26 DIAGNOSIS — Z515 Encounter for palliative care: Secondary | ICD-10-CM | POA: Diagnosis not present

## 2018-04-26 DIAGNOSIS — R293 Abnormal posture: Secondary | ICD-10-CM | POA: Diagnosis not present

## 2018-04-26 DIAGNOSIS — R1312 Dysphagia, oropharyngeal phase: Secondary | ICD-10-CM | POA: Diagnosis not present

## 2018-04-26 DIAGNOSIS — I480 Paroxysmal atrial fibrillation: Secondary | ICD-10-CM | POA: Diagnosis not present

## 2018-04-26 DIAGNOSIS — R41841 Cognitive communication deficit: Secondary | ICD-10-CM | POA: Diagnosis not present

## 2018-04-26 DIAGNOSIS — M6281 Muscle weakness (generalized): Secondary | ICD-10-CM | POA: Diagnosis not present

## 2018-04-26 DIAGNOSIS — G3183 Dementia with Lewy bodies: Secondary | ICD-10-CM | POA: Diagnosis not present

## 2018-04-26 DIAGNOSIS — G4089 Other seizures: Secondary | ICD-10-CM | POA: Diagnosis not present

## 2018-04-26 DIAGNOSIS — I6990 Unspecified sequelae of unspecified cerebrovascular disease: Secondary | ICD-10-CM | POA: Diagnosis not present

## 2018-04-27 DIAGNOSIS — R41841 Cognitive communication deficit: Secondary | ICD-10-CM | POA: Diagnosis not present

## 2018-04-27 DIAGNOSIS — R1312 Dysphagia, oropharyngeal phase: Secondary | ICD-10-CM | POA: Diagnosis not present

## 2018-04-27 DIAGNOSIS — M6281 Muscle weakness (generalized): Secondary | ICD-10-CM | POA: Diagnosis not present

## 2018-04-27 DIAGNOSIS — I6990 Unspecified sequelae of unspecified cerebrovascular disease: Secondary | ICD-10-CM | POA: Diagnosis not present

## 2018-04-27 DIAGNOSIS — R293 Abnormal posture: Secondary | ICD-10-CM | POA: Diagnosis not present

## 2018-04-28 DIAGNOSIS — R293 Abnormal posture: Secondary | ICD-10-CM | POA: Diagnosis not present

## 2018-04-28 DIAGNOSIS — R1312 Dysphagia, oropharyngeal phase: Secondary | ICD-10-CM | POA: Diagnosis not present

## 2018-04-28 DIAGNOSIS — I6990 Unspecified sequelae of unspecified cerebrovascular disease: Secondary | ICD-10-CM | POA: Diagnosis not present

## 2018-04-28 DIAGNOSIS — R41841 Cognitive communication deficit: Secondary | ICD-10-CM | POA: Diagnosis not present

## 2018-04-28 DIAGNOSIS — M6281 Muscle weakness (generalized): Secondary | ICD-10-CM | POA: Diagnosis not present

## 2018-05-04 ENCOUNTER — Non-Acute Institutional Stay: Payer: Medicare Other | Admitting: *Deleted

## 2018-05-04 ENCOUNTER — Non-Acute Institutional Stay: Payer: Medicare Other | Admitting: Licensed Clinical Social Worker

## 2018-05-04 VITALS — BP 112/72 | HR 68 | Temp 96.9°F | Resp 16

## 2018-05-04 DIAGNOSIS — Z515 Encounter for palliative care: Secondary | ICD-10-CM

## 2018-05-04 NOTE — Progress Notes (Signed)
COMMUNITY PALLIATIVE CARE SW NOTE  PATIENT NAME: [name] DOB: [birth year] MRN: [id number]  PRIMARY CARE PROVIDER: Gildardo Cranker, DO  RESPONSIBLE PARTY:  Acct ID - Guarantor Home Phone Work Phone Relationship Acct Type  [id number] [name]  Self P/F     [name], [name], [name] [address]     PLAN OF CARE and INTERVENTIONS:             1. GOALS OF CARE/ ADVANCE CARE PLANNING:  Family wishes patient to remain at the facility.  They want her kept comfortable.  Patient has a MOST form and DNR. 2. SOCIAL/EMOTIONAL/SPIRITUAL ASSESSMENT/ INTERVENTIONS:  SW and Palliative Care RN, [name], met with patient and her three daughters.  They had concern regarding patient's feet.  RN addressed.  SW provided active listening and supportive counseling to daughter's.  Patient repeated what her daughter's said and displayed a bright affect.  She did not display any nonverbal indicators of pain. 3. PATIENT/CAREGIVER EDUCATION/ COPING:  Family copes by expressing feelings and needs openly. 4. PERSONAL EMERGENCY PLAN:  Per facility protocol. 5. COMMUNITY RESOURCES COORDINATION/ HEALTH CARE NAVIGATION:  None. 6. FINANCIAL/LEGAL CONCERNS/INTERVENTIONS:  None.     SOCIAL HX:  Social History   Tobacco Use  . Smoking status: Former Research scientist (life sciences)  . Smokeless tobacco: Never Used  Substance Use Topics  . Alcohol use: No    Comment: 08/01/2012 "glass of wine on special occasions"    CODE STATUS:  DNR ADVANCED DIRECTIVES:  N MOST FORM COMPLETE:  MOST HOSPICE EDUCATION PROVIDED:  Patient was previously under Hospice care. PPS:  Patient's appetite is normal.  She is bed bound and total care. Duration of visit and documentation:  75 minutes.      Toni Corn Xerxes Agrusa, LCSW

## 2018-05-09 NOTE — Progress Notes (Signed)
COMMUNITY PALLIATIVE CARE RN NOTE  PATIENT NAME: [name] DOB: 09-Jan-1925 MRN: [id number]  PRIMARY CARE PROVIDER: Gildardo Cranker, DO  RESPONSIBLE PARTY:  Acct ID - Guarantor Home Phone Work Phone Relationship Acct Type  [id number] [name]  Self P/F     [place], [place], [place] [address]    PLAN OF CARE and INTERVENTION:  1. ADVANCE CARE PLANNING/GOALS OF CARE: Family wants her to remain at current facility. She is a DNR. 2. PATIENT/CAREGIVER EDUCATION: Reinforced elevating elbows on pillows and Minimizing Skin Breakdown 3. DISEASE STATUS: Joint visit made with Palliative Care SW, Lynn Duffy. Met with patient and her three daughters in patient's room. Patient sitting up in her bed awake. She is minimally verbal when question is asked. No evidence of pain noted. Swelling to bilateral elbows improved since now being elevated on pillows. She remains total care with all ADLs and is incontinent of both bowel and bladder. Her intake has slightly improved since last visit a few weeks ago. Breakfast remains the best meal of the day. Lunch and Dinner she may eat 25-50% or less. She does have a Merchant navy officer with meals to help. New order initiated to get patient up to geri chair twice weekly. Dressing changes continue to L heel by facility staff. Small scab noted to heel and small reddened area starting at base of L pinky toe. Staff aware and Wound nurse also made aware. Continues with Prevalon boots to bilateral feet and has an air mattress. Clipped and filed patient's fingernails as requested. L hand contracture present, but is kept in splint. Will continue to monitor.  HISTORY OF PRESENT ILLNESS:  This is a 85 yo female who resides at [facility]. Palliative Care Team continues to follow patient. Will continue to visit monthly and PRN.  CODE STATUS: DNR   ADVANCED DIRECTIVES: N MOST FORM: yes PPS: 30%   PHYSICAL EXAM:   VITALS: Today's Vitals   05/04/18 1408  BP:  112/72  Pulse: 68  Resp: 16  Temp: (!) 96.9 F (36.1 C)  TempSrc: Temporal  SpO2: 99%  PainSc: 0-No pain    LUNGS: clear to auscultation  CARDIAC: Cor RRR EXTREMITIES: Minimal swelling to bilateral elbows SKIN: Small scab to L heel and small reddended area to base of L pinky toe. Prevalon boots on and dressing is dry and intact  NEURO: Alert and oriented to self, pleasant demeanor, total care, bedbound   (Duration of visit and documentation 90 minutes)    Daryl Eastern, RN, BSN

## 2018-05-17 DIAGNOSIS — R1319 Other dysphagia: Secondary | ICD-10-CM | POA: Diagnosis not present

## 2018-05-17 DIAGNOSIS — Z515 Encounter for palliative care: Secondary | ICD-10-CM | POA: Diagnosis not present

## 2018-05-17 DIAGNOSIS — G4089 Other seizures: Secondary | ICD-10-CM | POA: Diagnosis not present

## 2018-05-17 DIAGNOSIS — G3183 Dementia with Lewy bodies: Secondary | ICD-10-CM | POA: Diagnosis not present

## 2018-05-17 DIAGNOSIS — I480 Paroxysmal atrial fibrillation: Secondary | ICD-10-CM | POA: Diagnosis not present

## 2018-06-13 ENCOUNTER — Other Ambulatory Visit: Payer: Self-pay

## 2018-06-13 ENCOUNTER — Non-Acute Institutional Stay: Payer: Medicare Other | Admitting: *Deleted

## 2018-06-13 VITALS — Wt 126.0 lb

## 2018-06-13 DIAGNOSIS — Z515 Encounter for palliative care: Secondary | ICD-10-CM

## 2018-06-13 NOTE — Progress Notes (Signed)
COMMUNITY PALLIATIVE CARE RN NOTE  PATIENT NAME: [name] DOB: [birth year] MRN: [id number]  PRIMARY CARE PROVIDER: Gildardo Cranker, DO  RESPONSIBLE PARTY:  Acct ID - Guarantor Home Phone Work Phone Relationship Acct Type  [id number] [name]  Self P/F     [place], [place], [place] [address]    PLAN OF CARE and INTERVENTION:  1. ADVANCE CARE PLANNING/GOALS OF CARE: Goal is for patient to remain at current facility. She is a DNR. 2. PATIENT/CAREGIVER EDUCATION: Reinforced Skin Breakdown Prevention 3. DISEASE STATUS: Met with patient at facility in her room. She is lying in bed awake and alert. No physical indicators of pain noted. She is more verbal today and states, "I feel fine. Thank you for coming." Other times she will not answer questions at all, mainly stares. She remains total care for all ADLs. She is incontinent of both bowel and bladder and wears adult briefs. Her intake is variable. She usually eats well for breakfast, however lunch is "hit or miss." Her weight fluctuates slightly. Jan 2020 125.2 lbs, Feb 2020 127.4 lbs and Mar 2020 126 lbs. She does have some mild dysphagia at times. She must be fed by staff each meal. She has slight edema noted to R upper arm, none in left arm. Both arms are elevated on pillows. She has a wound to her L heel and top of L foot. Currently covered with an Allevyn pad. Dressing is dry and intact. Provolone boots on both feet and air mattress in place. No new issues or concerns. Will continue to monitor.   HISTORY OF PRESENT ILLNESS:  This is a 85 yo female who resides at [facility]. Palliative Care Team continues to follow patient. Will continue to visit patient monthly and PRN.  CODE STATUS: DNR ADVANCED DIRECTIVES: N MOST FORM: yes PPS: 30%   PHYSICAL EXAM:   LUNGS: clear to auscultation  CARDIAC: Cor RRR EXTREMITIES: Trace edema to R upper arm (bilateral arms elevated on pillows) SKIN: Wound to L heel  and top of L foot, covered with Allevyn pad and is dry and intact  NEURO: Alert and oriented to self only, pleasant mood, total care with all ADLs   (Duration of visit and documentation 30 minutes)    Daryl Eastern, RN BSN

## 2018-07-14 DIAGNOSIS — R41841 Cognitive communication deficit: Secondary | ICD-10-CM | POA: Diagnosis not present

## 2018-07-14 DIAGNOSIS — I6990 Unspecified sequelae of unspecified cerebrovascular disease: Secondary | ICD-10-CM | POA: Diagnosis not present

## 2018-07-14 DIAGNOSIS — Z515 Encounter for palliative care: Secondary | ICD-10-CM | POA: Diagnosis not present

## 2018-07-14 DIAGNOSIS — R293 Abnormal posture: Secondary | ICD-10-CM | POA: Diagnosis not present

## 2018-07-14 DIAGNOSIS — R1319 Other dysphagia: Secondary | ICD-10-CM | POA: Diagnosis not present

## 2018-07-14 DIAGNOSIS — I480 Paroxysmal atrial fibrillation: Secondary | ICD-10-CM | POA: Diagnosis not present

## 2018-07-14 DIAGNOSIS — E440 Moderate protein-calorie malnutrition: Secondary | ICD-10-CM | POA: Diagnosis not present

## 2018-07-14 DIAGNOSIS — G4089 Other seizures: Secondary | ICD-10-CM | POA: Diagnosis not present

## 2018-07-14 DIAGNOSIS — R1312 Dysphagia, oropharyngeal phase: Secondary | ICD-10-CM | POA: Diagnosis not present

## 2018-07-14 DIAGNOSIS — G3183 Dementia with Lewy bodies: Secondary | ICD-10-CM | POA: Diagnosis not present

## 2018-07-14 DIAGNOSIS — M6281 Muscle weakness (generalized): Secondary | ICD-10-CM | POA: Diagnosis not present

## 2018-07-15 DIAGNOSIS — R1312 Dysphagia, oropharyngeal phase: Secondary | ICD-10-CM | POA: Diagnosis not present

## 2018-07-15 DIAGNOSIS — R41841 Cognitive communication deficit: Secondary | ICD-10-CM | POA: Diagnosis not present

## 2018-07-15 DIAGNOSIS — M6281 Muscle weakness (generalized): Secondary | ICD-10-CM | POA: Diagnosis not present

## 2018-07-15 DIAGNOSIS — I6990 Unspecified sequelae of unspecified cerebrovascular disease: Secondary | ICD-10-CM | POA: Diagnosis not present

## 2018-07-15 DIAGNOSIS — R293 Abnormal posture: Secondary | ICD-10-CM | POA: Diagnosis not present

## 2018-07-16 DIAGNOSIS — I6990 Unspecified sequelae of unspecified cerebrovascular disease: Secondary | ICD-10-CM | POA: Diagnosis not present

## 2018-07-16 DIAGNOSIS — R1312 Dysphagia, oropharyngeal phase: Secondary | ICD-10-CM | POA: Diagnosis not present

## 2018-07-16 DIAGNOSIS — R41841 Cognitive communication deficit: Secondary | ICD-10-CM | POA: Diagnosis not present

## 2018-07-16 DIAGNOSIS — M6281 Muscle weakness (generalized): Secondary | ICD-10-CM | POA: Diagnosis not present

## 2018-07-16 DIAGNOSIS — R293 Abnormal posture: Secondary | ICD-10-CM | POA: Diagnosis not present

## 2018-07-20 DIAGNOSIS — R0902 Hypoxemia: Secondary | ICD-10-CM | POA: Diagnosis not present

## 2018-07-20 DIAGNOSIS — R0989 Other specified symptoms and signs involving the circulatory and respiratory systems: Secondary | ICD-10-CM | POA: Diagnosis not present

## 2018-07-21 ENCOUNTER — Telehealth: Payer: Self-pay | Admitting: Internal Medicine

## 2018-07-21 DIAGNOSIS — Z515 Encounter for palliative care: Secondary | ICD-10-CM | POA: Diagnosis not present

## 2018-07-21 DIAGNOSIS — J9600 Acute respiratory failure, unspecified whether with hypoxia or hypercapnia: Secondary | ICD-10-CM | POA: Diagnosis not present

## 2018-07-21 DIAGNOSIS — R6889 Other general symptoms and signs: Secondary | ICD-10-CM | POA: Diagnosis not present

## 2018-07-21 NOTE — Telephone Encounter (Signed)
Follow-up phone call to Toni Phoenix, NP related to triage nurse report of decline of patient status at SNF.        She reports that [name] is less responsive and increased lethargy but opens her eyes when her is calname led.  She does not focus, track or follow commands.  She describes her breath sounds as a "bronchial rattle" without increased respiratory effort. Her O2 sats are 95% with use of 4L via [name] as compared to 67% on room air last PM.   Other pertinent findings:  Afebrile, skin in warm and dry.  She sips fluids but has not been eating for an unknown period of time.  She is bedridden and dependant on staff for all ADLs.  Her weight on 05/06/18 was 127# and on 06/29/18 was 119#.    Labs from 07/17/18:  H/H  11.7/37, WBC 5.4  BUN/CREA  19/0.54, PROT/ALB  4.4/2.2.  Labs and COVID-19 screening are pending from 07/21/18.  Recommendations given for transition to Hospice care. All providers in agreement.  Daughter [name] I agreement with re-admit to Hospice.  Order written by [name] and confirmation by [name] that referral will be faxed to [name].  Referral center was made aware of plans.                                                                    Toni Lex, NP-C

## 2018-07-22 DIAGNOSIS — F0280 Dementia in other diseases classified elsewhere without behavioral disturbance: Secondary | ICD-10-CM | POA: Diagnosis not present

## 2018-07-22 DIAGNOSIS — I4891 Unspecified atrial fibrillation: Secondary | ICD-10-CM | POA: Diagnosis not present

## 2018-07-22 DIAGNOSIS — J9601 Acute respiratory failure with hypoxia: Secondary | ICD-10-CM | POA: Diagnosis not present

## 2018-07-22 DIAGNOSIS — I69318 Other symptoms and signs involving cognitive functions following cerebral infarction: Secondary | ICD-10-CM | POA: Diagnosis not present

## 2018-07-22 DIAGNOSIS — R1310 Dysphagia, unspecified: Secondary | ICD-10-CM | POA: Diagnosis not present

## 2018-07-22 DIAGNOSIS — R569 Unspecified convulsions: Secondary | ICD-10-CM | POA: Diagnosis not present

## 2018-07-22 DIAGNOSIS — G309 Alzheimer's disease, unspecified: Secondary | ICD-10-CM | POA: Diagnosis not present

## 2018-07-22 DIAGNOSIS — D649 Anemia, unspecified: Secondary | ICD-10-CM | POA: Diagnosis not present

## 2018-07-22 DIAGNOSIS — Z741 Need for assistance with personal care: Secondary | ICD-10-CM | POA: Diagnosis not present

## 2018-07-22 DIAGNOSIS — E785 Hyperlipidemia, unspecified: Secondary | ICD-10-CM | POA: Diagnosis not present

## 2018-07-22 DIAGNOSIS — I10 Essential (primary) hypertension: Secondary | ICD-10-CM | POA: Diagnosis not present

## 2018-07-22 DIAGNOSIS — J302 Other seasonal allergic rhinitis: Secondary | ICD-10-CM | POA: Diagnosis not present

## 2018-07-22 DIAGNOSIS — E46 Unspecified protein-calorie malnutrition: Secondary | ICD-10-CM | POA: Diagnosis not present

## 2018-07-22 DIAGNOSIS — Z9981 Dependence on supplemental oxygen: Secondary | ICD-10-CM | POA: Diagnosis not present

## 2018-07-22 DIAGNOSIS — R630 Anorexia: Secondary | ICD-10-CM | POA: Diagnosis not present

## 2018-07-23 DIAGNOSIS — J9601 Acute respiratory failure with hypoxia: Secondary | ICD-10-CM | POA: Diagnosis not present

## 2018-07-23 DIAGNOSIS — F0280 Dementia in other diseases classified elsewhere without behavioral disturbance: Secondary | ICD-10-CM | POA: Diagnosis not present

## 2018-07-23 DIAGNOSIS — G309 Alzheimer's disease, unspecified: Secondary | ICD-10-CM | POA: Diagnosis not present

## 2018-07-23 DIAGNOSIS — E46 Unspecified protein-calorie malnutrition: Secondary | ICD-10-CM | POA: Diagnosis not present

## 2018-07-23 DIAGNOSIS — R1310 Dysphagia, unspecified: Secondary | ICD-10-CM | POA: Diagnosis not present

## 2018-07-23 DIAGNOSIS — R569 Unspecified convulsions: Secondary | ICD-10-CM | POA: Diagnosis not present

## 2018-07-24 DIAGNOSIS — R1310 Dysphagia, unspecified: Secondary | ICD-10-CM | POA: Diagnosis not present

## 2018-07-24 DIAGNOSIS — F0280 Dementia in other diseases classified elsewhere without behavioral disturbance: Secondary | ICD-10-CM | POA: Diagnosis not present

## 2018-07-24 DIAGNOSIS — B349 Viral infection, unspecified: Secondary | ICD-10-CM | POA: Diagnosis not present

## 2018-07-24 DIAGNOSIS — E46 Unspecified protein-calorie malnutrition: Secondary | ICD-10-CM | POA: Diagnosis not present

## 2018-07-24 DIAGNOSIS — G309 Alzheimer's disease, unspecified: Secondary | ICD-10-CM | POA: Diagnosis not present

## 2018-07-24 DIAGNOSIS — J9601 Acute respiratory failure with hypoxia: Secondary | ICD-10-CM | POA: Diagnosis not present

## 2018-07-24 DIAGNOSIS — R569 Unspecified convulsions: Secondary | ICD-10-CM | POA: Diagnosis not present

## 2018-07-25 DIAGNOSIS — R569 Unspecified convulsions: Secondary | ICD-10-CM | POA: Diagnosis not present

## 2018-07-25 DIAGNOSIS — N178 Other acute kidney failure: Secondary | ICD-10-CM | POA: Diagnosis not present

## 2018-07-25 DIAGNOSIS — R64 Cachexia: Secondary | ICD-10-CM | POA: Diagnosis not present

## 2018-07-25 DIAGNOSIS — R5381 Other malaise: Secondary | ICD-10-CM | POA: Diagnosis not present

## 2018-07-25 DIAGNOSIS — G309 Alzheimer's disease, unspecified: Secondary | ICD-10-CM | POA: Diagnosis not present

## 2018-07-25 DIAGNOSIS — R1310 Dysphagia, unspecified: Secondary | ICD-10-CM | POA: Diagnosis not present

## 2018-07-25 DIAGNOSIS — J9600 Acute respiratory failure, unspecified whether with hypoxia or hypercapnia: Secondary | ICD-10-CM | POA: Diagnosis not present

## 2018-07-25 DIAGNOSIS — F0280 Dementia in other diseases classified elsewhere without behavioral disturbance: Secondary | ICD-10-CM | POA: Diagnosis not present

## 2018-07-25 DIAGNOSIS — J9601 Acute respiratory failure with hypoxia: Secondary | ICD-10-CM | POA: Diagnosis not present

## 2018-07-25 DIAGNOSIS — E46 Unspecified protein-calorie malnutrition: Secondary | ICD-10-CM | POA: Diagnosis not present

## 2018-07-25 DIAGNOSIS — L89154 Pressure ulcer of sacral region, stage 4: Secondary | ICD-10-CM | POA: Diagnosis not present

## 2018-07-28 DEATH — deceased

## 2019-01-30 ENCOUNTER — Encounter: Payer: Self-pay | Admitting: Internal Medicine

## 2019-01-30 NOTE — Telephone Encounter (Signed)
Erroneous encounter.  Gonzella Lex, NP
